# Patient Record
Sex: Female | Born: 1950
Health system: Southern US, Community
[De-identification: ages and names within clinical notes are randomized; demographics above are authoritative.]

## PROBLEM LIST (undated history)

## (undated) DIAGNOSIS — J449 Chronic obstructive pulmonary disease, unspecified: Secondary | ICD-10-CM

## (undated) DIAGNOSIS — F329 Major depressive disorder, single episode, unspecified: Secondary | ICD-10-CM

## (undated) DIAGNOSIS — R7303 Prediabetes: Secondary | ICD-10-CM

## (undated) DIAGNOSIS — M549 Dorsalgia, unspecified: Secondary | ICD-10-CM

## (undated) DIAGNOSIS — L409 Psoriasis, unspecified: Secondary | ICD-10-CM

## (undated) DIAGNOSIS — I10 Essential (primary) hypertension: Secondary | ICD-10-CM

## (undated) DIAGNOSIS — Z9889 Other specified postprocedural states: Secondary | ICD-10-CM

## (undated) DIAGNOSIS — F419 Anxiety disorder, unspecified: Secondary | ICD-10-CM

## (undated) DIAGNOSIS — F32A Depression, unspecified: Secondary | ICD-10-CM

## (undated) DIAGNOSIS — E039 Hypothyroidism, unspecified: Secondary | ICD-10-CM

## (undated) DIAGNOSIS — G8929 Other chronic pain: Secondary | ICD-10-CM

## (undated) DIAGNOSIS — M1990 Unspecified osteoarthritis, unspecified site: Secondary | ICD-10-CM

## (undated) DIAGNOSIS — R112 Nausea with vomiting, unspecified: Secondary | ICD-10-CM

## (undated) DIAGNOSIS — K219 Gastro-esophageal reflux disease without esophagitis: Secondary | ICD-10-CM

## (undated) DIAGNOSIS — J189 Pneumonia, unspecified organism: Secondary | ICD-10-CM

## (undated) DIAGNOSIS — E079 Disorder of thyroid, unspecified: Secondary | ICD-10-CM

## (undated) DIAGNOSIS — G709 Myoneural disorder, unspecified: Secondary | ICD-10-CM

## (undated) HISTORY — PX: HAND SURGERY: SHX662

## (undated) HISTORY — DX: Major depressive disorder, single episode, unspecified: F32.9

## (undated) HISTORY — PX: OTHER SURGICAL HISTORY: SHX169

## (undated) HISTORY — DX: Depression, unspecified: F32.A

## (undated) HISTORY — PX: OOPHORECTOMY: SHX86

## (undated) HISTORY — DX: Psoriasis, unspecified: L40.9

## (undated) HISTORY — PX: ABDOMINAL HYSTERECTOMY: SHX81

## (undated) HISTORY — PX: CARPAL TUNNEL RELEASE: SHX101

---

## 2003-12-06 ENCOUNTER — Ambulatory Visit: Payer: Self-pay

## 2004-08-02 ENCOUNTER — Ambulatory Visit: Payer: Self-pay | Admitting: General Practice

## 2004-09-27 ENCOUNTER — Encounter: Payer: Self-pay | Admitting: General Practice

## 2006-04-02 ENCOUNTER — Ambulatory Visit: Payer: Self-pay

## 2006-08-19 ENCOUNTER — Ambulatory Visit: Payer: Self-pay | Admitting: Unknown Physician Specialty

## 2006-09-05 ENCOUNTER — Ambulatory Visit: Payer: Self-pay | Admitting: Unknown Physician Specialty

## 2006-09-12 ENCOUNTER — Ambulatory Visit: Payer: Self-pay | Admitting: Unknown Physician Specialty

## 2006-09-23 ENCOUNTER — Ambulatory Visit: Payer: Self-pay | Admitting: Unknown Physician Specialty

## 2007-07-01 ENCOUNTER — Ambulatory Visit: Payer: Self-pay

## 2008-03-22 DIAGNOSIS — M5137 Other intervertebral disc degeneration, lumbosacral region: Secondary | ICD-10-CM | POA: Insufficient documentation

## 2008-09-02 DIAGNOSIS — R519 Headache, unspecified: Secondary | ICD-10-CM | POA: Insufficient documentation

## 2008-09-02 DIAGNOSIS — I10 Essential (primary) hypertension: Secondary | ICD-10-CM | POA: Insufficient documentation

## 2008-09-19 DIAGNOSIS — F17200 Nicotine dependence, unspecified, uncomplicated: Secondary | ICD-10-CM | POA: Insufficient documentation

## 2008-09-19 DIAGNOSIS — Z720 Tobacco use: Secondary | ICD-10-CM | POA: Insufficient documentation

## 2008-10-14 ENCOUNTER — Ambulatory Visit: Payer: Self-pay

## 2008-10-21 DIAGNOSIS — M48061 Spinal stenosis, lumbar region without neurogenic claudication: Secondary | ICD-10-CM | POA: Insufficient documentation

## 2008-10-25 DIAGNOSIS — R339 Retention of urine, unspecified: Secondary | ICD-10-CM | POA: Insufficient documentation

## 2009-03-14 DIAGNOSIS — F411 Generalized anxiety disorder: Secondary | ICD-10-CM | POA: Insufficient documentation

## 2009-07-01 DIAGNOSIS — K219 Gastro-esophageal reflux disease without esophagitis: Secondary | ICD-10-CM | POA: Insufficient documentation

## 2011-05-12 ENCOUNTER — Emergency Department: Payer: Self-pay | Admitting: Emergency Medicine

## 2011-10-10 ENCOUNTER — Emergency Department: Payer: Self-pay | Admitting: Emergency Medicine

## 2011-10-10 LAB — URINALYSIS, COMPLETE
Glucose,UR: NEGATIVE mg/dL (ref 0–75)
Hyaline Cast: 3
Ph: 5 (ref 4.5–8.0)
Protein: NEGATIVE
RBC,UR: 2 /HPF (ref 0–5)
Squamous Epithelial: <1

## 2012-05-06 DIAGNOSIS — N390 Urinary tract infection, site not specified: Secondary | ICD-10-CM | POA: Insufficient documentation

## 2012-08-05 DIAGNOSIS — Z0289 Encounter for other administrative examinations: Secondary | ICD-10-CM | POA: Insufficient documentation

## 2012-08-05 DIAGNOSIS — Z79899 Other long term (current) drug therapy: Secondary | ICD-10-CM | POA: Insufficient documentation

## 2012-08-05 DIAGNOSIS — G894 Chronic pain syndrome: Secondary | ICD-10-CM | POA: Insufficient documentation

## 2012-08-23 ENCOUNTER — Emergency Department: Payer: Self-pay | Admitting: Unknown Physician Specialty

## 2013-02-02 ENCOUNTER — Emergency Department: Payer: Self-pay | Admitting: Emergency Medicine

## 2013-02-02 LAB — TROPONIN I: Troponin-I: <0.02 ng/mL

## 2013-02-02 LAB — BASIC METABOLIC PANEL
Anion Gap: 2 — ABNORMAL LOW (ref 7–16)
BUN: 8 mg/dL (ref 7–18)
Calcium, Total: 9.9 mg/dL (ref 8.5–10.1)
Co2: 30 mmol/L (ref 21–32)
Creatinine: 0.76 mg/dL (ref 0.60–1.30)
EGFR (Non-African Amer.): >60
Osmolality: 271 (ref 275–301)
Potassium: 3.7 mmol/L (ref 3.5–5.1)
Sodium: 135 mmol/L — ABNORMAL LOW (ref 136–145)

## 2013-02-02 LAB — CBC
HCT: 49 % — ABNORMAL HIGH (ref 35.0–47.0)
MCH: 30.2 pg (ref 26.0–34.0)
MCV: 90 fL (ref 80–100)
Platelet: 241 10*3/uL (ref 150–440)
RDW: 13.2 % (ref 11.5–14.5)
WBC: 7.3 10*3/uL (ref 3.6–11.0)

## 2013-07-28 DIAGNOSIS — F1120 Opioid dependence, uncomplicated: Secondary | ICD-10-CM | POA: Insufficient documentation

## 2013-07-28 DIAGNOSIS — F1190 Opioid use, unspecified, uncomplicated: Secondary | ICD-10-CM | POA: Insufficient documentation

## 2013-09-30 ENCOUNTER — Ambulatory Visit: Payer: Self-pay | Admitting: Family Medicine

## 2014-06-21 DIAGNOSIS — K219 Gastro-esophageal reflux disease without esophagitis: Secondary | ICD-10-CM | POA: Diagnosis not present

## 2014-06-21 DIAGNOSIS — F410 Panic disorder [episodic paroxysmal anxiety] without agoraphobia: Secondary | ICD-10-CM | POA: Diagnosis present

## 2014-06-22 ENCOUNTER — Emergency Department
Admission: EM | Admit: 2014-06-22 | Discharge: 2014-06-23 | Disposition: A | Discharge status: Home / Self Care | Payer: PPO | Attending: Emergency Medicine | Admitting: Emergency Medicine

## 2014-06-22 ENCOUNTER — Emergency Department
Admission: EM | Admit: 2014-06-22 | Discharge: 2014-06-22 | Disposition: A | Discharge status: Home / Self Care | Payer: PPO | Attending: Emergency Medicine | Admitting: Emergency Medicine

## 2014-06-22 ENCOUNTER — Encounter: Payer: Self-pay | Admitting: Urgent Care

## 2014-06-22 ENCOUNTER — Encounter: Payer: Self-pay | Admitting: Emergency Medicine

## 2014-06-22 DIAGNOSIS — F410 Panic disorder [episodic paroxysmal anxiety] without agoraphobia: Secondary | ICD-10-CM

## 2014-06-22 DIAGNOSIS — Z720 Tobacco use: Secondary | ICD-10-CM | POA: Insufficient documentation

## 2014-06-22 DIAGNOSIS — F1320 Sedative, hypnotic or anxiolytic dependence, uncomplicated: Secondary | ICD-10-CM | POA: Insufficient documentation

## 2014-06-22 DIAGNOSIS — Z79899 Other long term (current) drug therapy: Secondary | ICD-10-CM | POA: Insufficient documentation

## 2014-06-22 DIAGNOSIS — R443 Hallucinations, unspecified: Secondary | ICD-10-CM

## 2014-06-22 DIAGNOSIS — F32A Depression, unspecified: Secondary | ICD-10-CM

## 2014-06-22 DIAGNOSIS — F419 Anxiety disorder, unspecified: Secondary | ICD-10-CM

## 2014-06-22 DIAGNOSIS — I10 Essential (primary) hypertension: Secondary | ICD-10-CM | POA: Insufficient documentation

## 2014-06-22 HISTORY — DX: Other chronic pain: G89.29

## 2014-06-22 HISTORY — DX: Disorder of thyroid, unspecified: E07.9

## 2014-06-22 HISTORY — DX: Dorsalgia, unspecified: M54.9

## 2014-06-22 HISTORY — DX: Anxiety disorder, unspecified: F41.9

## 2014-06-22 HISTORY — DX: Essential (primary) hypertension: I10

## 2014-06-22 HISTORY — DX: Gastro-esophageal reflux disease without esophagitis: K21.9

## 2014-06-22 LAB — COMPREHENSIVE METABOLIC PANEL
ALBUMIN: 4.1 g/dL (ref 3.5–5.0)
ALK PHOS: 94 U/L (ref 38–126)
ALT: 20 U/L (ref 14–54)
AST: 26 U/L (ref 15–41)
Anion gap: 11 (ref 5–15)
BILIRUBIN TOTAL: 0.5 mg/dL (ref 0.3–1.2)
BUN: 18 mg/dL (ref 6–20)
CHLORIDE: 101 mmol/L (ref 101–111)
CO2: 28 mmol/L (ref 22–32)
CREATININE: 1.06 mg/dL — AB (ref 0.44–1.00)
Calcium: 9 mg/dL (ref 8.9–10.3)
GFR calc non Af Amer: 54 mL/min — ABNORMAL LOW (ref >60)
Glucose, Bld: 123 mg/dL — ABNORMAL HIGH (ref 65–99)
POTASSIUM: 3.1 mmol/L — AB (ref 3.5–5.1)
Sodium: 140 mmol/L (ref 135–145)
Total Protein: 6.8 g/dL (ref 6.5–8.1)

## 2014-06-22 LAB — URINE DRUG SCREEN, QUALITATIVE (ARMC ONLY)
AMPHETAMINES, UR SCREEN: NOT DETECTED
Barbiturates, Ur Screen: POSITIVE — AB
Benzodiazepine, Ur Scrn: POSITIVE — AB
Cannabinoid 50 Ng, Ur ~~LOC~~: NOT DETECTED
Cocaine Metabolite,Ur ~~LOC~~: NOT DETECTED
MDMA (Ecstasy)Ur Screen: NOT DETECTED
METHADONE SCREEN, URINE: NOT DETECTED
OPIATE, UR SCREEN: NOT DETECTED
PHENCYCLIDINE (PCP) UR S: NOT DETECTED
Tricyclic, Ur Screen: NOT DETECTED

## 2014-06-22 LAB — CBC
HEMATOCRIT: 44.6 % (ref 35.0–47.0)
Hemoglobin: 14.8 g/dL (ref 12.0–16.0)
MCH: 31 pg (ref 26.0–34.0)
MCHC: 33.3 g/dL (ref 32.0–36.0)
MCV: 93.1 fL (ref 80.0–100.0)
Platelets: 330 10*3/uL (ref 150–440)
RBC: 4.79 MIL/uL (ref 3.80–5.20)
RDW: 13.4 % (ref 11.5–14.5)
WBC: 9.8 10*3/uL (ref 3.6–11.0)

## 2014-06-22 LAB — URINALYSIS COMPLETE WITH MICROSCOPIC (ARMC ONLY)
Bilirubin Urine: NEGATIVE
GLUCOSE, UA: NEGATIVE mg/dL
Hgb urine dipstick: NEGATIVE
Ketones, ur: NEGATIVE mg/dL
NITRITE: NEGATIVE
Protein, ur: 30 mg/dL — AB
SPECIFIC GRAVITY, URINE: 1.02 (ref 1.005–1.030)
pH: 5 (ref 5.0–8.0)

## 2014-06-22 MED ORDER — LORAZEPAM 1 MG PO TABS
1.0000 mg | ORAL_TABLET | Freq: Once | ORAL | Status: AC
  Administered 2014-06-22: 1 mg via ORAL

## 2014-06-22 MED ORDER — LORAZEPAM 1 MG PO TABS
ORAL_TABLET | ORAL | Status: AC
  Administered 2014-06-22: 1 mg via ORAL
  Filled 2014-06-22: qty 1

## 2014-06-22 MED ORDER — GI COCKTAIL ~~LOC~~
ORAL | Status: AC
  Administered 2014-06-22: 30 mL via ORAL
  Filled 2014-06-22: qty 30

## 2014-06-22 MED ORDER — PANTOPRAZOLE SODIUM 40 MG PO TBEC
DELAYED_RELEASE_TABLET | ORAL | Status: AC
  Administered 2014-06-22: 40 mg via ORAL
  Filled 2014-06-22: qty 1

## 2014-06-22 MED ORDER — PANTOPRAZOLE SODIUM 40 MG PO TBEC
40.0000 mg | DELAYED_RELEASE_TABLET | Freq: Once | ORAL | Status: AC
  Administered 2014-06-22: 40 mg via ORAL

## 2014-06-22 MED ORDER — ALPRAZOLAM 0.5 MG PO TABS: 0.5000 mg | ORAL_TABLET | Freq: Two times a day (BID) | ORAL | Status: DC

## 2014-06-22 MED ORDER — GI COCKTAIL ~~LOC~~
30.0000 mL | Freq: Once | ORAL | Status: AC
  Administered 2014-06-22: 30 mL via ORAL

## 2014-06-22 NOTE — BH Assessment (Signed)
Assessment Note  [name] is an 64 y.o. female. She reports to the ED with her family members.  She reports feeling depressed and anxious.  Reports that she is having hallucinations.  She expressed that she is seeing people and objects around her. She states the hallucinations do not speak to her or make noises.  She reports seeing "Strange objects".  She denied having homicidal or suicidal ideation or intent.  Axis I: Anxiety Disorder NOS Axis II: Deferred Axis III:  Past Medical History  Diagnosis Date   Anxiety    Anxiety attack    GERD (gastroesophageal reflux disease)    Hypertension    Thyroid disease    Chronic back pain    Axis IV: problems related to social environment Axis V: 51-60 moderate symptoms  Past Medical History:  Past Medical History  Diagnosis Date   Anxiety    Anxiety attack    GERD (gastroesophageal reflux disease)    Hypertension    Thyroid disease    Chronic back pain     Past Surgical History  Procedure Laterality Date   Abdominal hysterectomy     Back surgery     Bladder stimulator     Hand surgery Right     Family History: No family history on file.  Social History:  reports that she has been smoking Cigarettes.  She has been smoking about 1.00 pack per day. She does not have any smokeless tobacco history on file. She reports that she does not drink alcohol or use illicit drugs.  Additional Social History:  Alcohol / Drug Use History of alcohol / drug use?: No history of alcohol / drug abuse  CIWA: CIWA-Ar BP: (!) 117/59 mmHg Pulse Rate: 89 COWS:    Allergies: No Known Allergies  Home Medications:  (Not in a hospital admission)  OB/GYN Status:  No LMP recorded. Patient is postmenopausal.  General Assessment Data Location of Assessment: [facility] ED TTS Assessment: In system Is this a Tele or Face-to-Face Assessment?: Face-to-Face Is this an Initial Assessment or a Re-assessment for this encounter?: Initial  Assessment Marital status: Widowed Living Arrangements: Alone Can pt return to current living arrangement?: Yes Admission Status: Voluntary Is patient capable of signing voluntary admission?: Yes Referral Source: MD Insurance type: [employer]     Crisis Care Plan Living Arrangements: Alone Name of Psychiatrist: None Name of Therapist: None  Education Status Is patient currently in school?: No  Risk to self with the past 6 months Suicidal Ideation: No Has patient been a risk to self within the past 6 months prior to admission? : No Suicidal Intent: No Has patient had any suicidal intent within the past 6 months prior to admission? : No Is patient at risk for suicide?: No Suicidal Plan?: No Has patient had any suicidal plan within the past 6 months prior to admission? : No Substance abuse history and/or treatment for substance abuse?: No  Risk to Others within the past 6 months Homicidal Ideation: No Does patient have any lifetime risk of violence toward others beyond the six months prior to admission? : No Thoughts of Harm to Others: No Current Homicidal Intent: No Current Homicidal Plan: No History of harm to others?: No Assessment of Violence: None Noted Does patient have access to weapons?: No Criminal Charges Pending?: No Does patient have a court date: No Is patient on probation?: No  Psychosis Hallucinations: Visual Delusions: None noted  Mental Status Report Appearance/Hygiene: In scrubs Eye Contact: Good Motor  Activity: Unremarkable Speech: Unremarkable Level of Consciousness: Alert Mood: Sullen Affect: Flat Anxiety Level: Moderate Judgement: Unimpaired Orientation: Person, Place, Time, Situation Obsessive Compulsive Thoughts/Behaviors: None                      Abuse/Neglect Assessment (Assessment to be complete while patient is alone) Physical Abuse: Denies Verbal Abuse: Denies Sexual Abuse: Denies Exploitation of  patient/patient's resources: Denies Self-Neglect: Denies     Advance Directives (For Healthcare) Does patient have an advance directive?: No Would patient like information on creating an advanced directive?: Yes - Educational materials given          Disposition:  Disposition Initial Assessment Completed for this Encounter: Yes Disposition of Patient: Referred to (Psychiatric consult)  On Site Evaluation by:   Reviewed with Physician:    Guerry Minors 06/22/2014 10:49 PM

## 2014-06-22 NOTE — ED Notes (Signed)
BEHAVIORAL HEALTH ROUNDING Patient sleeping: No. Patient alert and oriented: yes Behavior appropriate: Yes.  ; If no, describe:  Nutrition and fluids offered: Yes  Toileting and hygiene offered: Yes  Sitter present: yes Law enforcement present: Yes  

## 2014-06-22 NOTE — ED Notes (Signed)
Patient contacted daughter-in-law. Family to present to ED to pick up patient. ETA 15-20 minutes. Charge nurse aware; patient to remain in room until family arrives secondary to anxiety.

## 2014-06-22 NOTE — ED Notes (Signed)

## 2014-06-22 NOTE — ED Notes (Signed)
Pt  VOLUNTARY   PENDING  PSYCH  CONSULT

## 2014-06-22 NOTE — ED Notes (Signed)

## 2014-06-22 NOTE — ED Notes (Signed)
Continues to attempt to secure transportation home. This RN has been helping patient because she is reportedly "too nervous".

## 2014-06-22 NOTE — ED Notes (Signed)
Pt watching tv

## 2014-06-22 NOTE — ED Provider Notes (Signed)
____________________________________________  Time seen: 1700  I have reviewed the triage vital signs and the nursing notes.   HISTORY  Chief Complaint Hallucinations   History limited by: Not Limited   HPI [name] is a 64 y.o. female resents to the emergency department today with concerns for hallucinations. She states hallucinations been going on for the past 4-5 days. This mainly of her seeing people that are not there. She denies that they're telling her to harm herself or others. She denies any SI. She states that she has subsequently had panic attacks because of these hallucinations. Was seen in the emergency department earlier this morning for panic and intact. She states today  She was driving her car when she started feeling off. She then asked her cousin in the backseat to drive. It was at this point she realized her cousin was not there and she was having yet another hallucination R concern. She became lost. Family had to come pick her up. Patient denies any fevers, chest pain, shortness of breath.     Past Medical History  Diagnosis Date  . Anxiety   . Anxiety attack   . GERD (gastroesophageal reflux disease)   . Hypertension   . Thyroid disease   . Chronic back pain     There are no active problems to display for this patient.   Past Surgical History  Procedure Laterality Date  . Abdominal hysterectomy    . Back surgery    . Bladder stimulator    . Hand surgery Right     Current Outpatient Rx  Name  Route  Sig  Dispense  Refill  . ALPRAZolam (XANAX) 0.5 MG tablet   Oral   Take 1 tablet (0.5 mg total) by mouth 2 (two) times daily.   4 tablet   0     Allergies Review of patient's allergies indicates no known allergies.  No family history on file.  Social History History  Substance Use Topics  . Smoking status: Current Every Day Smoker -- 1.00 packs/day    Types: Cigarettes  . Smokeless tobacco: Not on file  . Alcohol Use: No     Review of Systems  Constitutional: Negative for fever. Cardiovascular: Negative for chest pain. Respiratory: Negative for shortness of breath. Gastrointestinal: Negative for abdominal pain, vomiting and diarrhea. Genitourinary: Negative for dysuria. Musculoskeletal: Negative for back pain. Skin: Negative for rash. Neurological: Negative for headaches, focal weakness or numbness. Psychiatric:visual hallucinations  10-point ROS otherwise negative.  ____________________________________________   PHYSICAL EXAM:  VITAL SIGNS: ED Triage Vitals  Enc Vitals Group     BP 06/22/14 1626 126/60 mmHg     Pulse Rate 06/22/14 1626 103     Resp 06/22/14 1626 18     Temp 06/22/14 1626 98.4 F (36.9 C)     Temp Source 06/22/14 1626 Oral     SpO2 06/22/14 1626 96 %     Weight 06/22/14 1626 167 lb (75.751 kg)     Height 06/22/14 1626 5\' 2"  (1.575 m)   Constitutional: Alert and oriented. Well appearing and in no distress. Eyes: Conjunctivae are normal. PERRL. Normal extraocular movements. ENT   Head: Normocephalic and atraumatic.   Nose: No congestion/rhinnorhea.   Mouth/Throat: Mucous membranes are moist.   Neck: No stridor. Hematological/Lymphatic/Immunilogical: No cervical lymphadenopathy. Cardiovascular: Normal rate, regular rhythm.  No murmurs, rubs, or gallops. Respiratory: Normal respiratory effort without tachypnea nor retractions. Breath sounds are clear and equal bilaterally. No wheezes/rales/rhonchi. Gastrointestinal: Soft and nontender.  No distention. There is no CVA tenderness. Genitourinary: Deferred Musculoskeletal: Normal range of motion in all extremities. No joint effusions.  No lower extremity tenderness nor edema. Neurologic:  Normal speech and language. No gross focal neurologic deficits are appreciated. Speech is normal.  Skin:  Skin is warm, dry and intact. No rash noted. Psychiatric: Mood and affect are normal. Speech and behavior are normal.  Patient exhibits appropriate insight and judgment. patient does not appear to be responding to any internal stimuli.  ____________________________________________    LABS (pertinent positives/negatives)  Labs Reviewed  COMPREHENSIVE METABOLIC PANEL - Abnormal; Notable for the following:    Potassium 3.1 (*)    Glucose, Bld 123 (*)    Creatinine, Ser 1.06 (*)    GFR calc non Af Amer 54 (*)    All other components within normal limits  URINALYSIS COMPLETEWITH MICROSCOPIC ([facility])  - Abnormal; Notable for the following:    Color, Urine YELLOW (*)    APPearance HAZY (*)    Protein, ur 30 (*)    Leukocytes, UA 2+ (*)    Bacteria, UA RARE (*)    Squamous Epithelial / LPF 0-5 (*)    All other components within normal limits  URINE DRUG SCREEN, QUALITATIVE ([facility]) - Abnormal; Notable for the following:    Barbiturates, Ur Screen POSITIVE (*)    Benzodiazepine, Ur Scrn POSITIVE (*)    All other components within normal limits  CBC     ____________________________________________   EKG  None  ____________________________________________    RADIOLOGY  None  ____________________________________________   PROCEDURES  Procedure(s) performed: None  Critical Care performed: No  ____________________________________________   INITIAL IMPRESSION / ASSESSMENT AND PLAN / ED COURSE  Pertinent labs & imaging results that were available during my care of the patient were reviewed by me and considered in my medical decision making (see chart for details).  She here with hallucinations. They have been going on for 4-5 days. On exam patient is calm and acting appropriately. Does not appear to be responding to any internal stimuli.Patient states she is willing to stay to see psychiatry. Let work without any concerning findings. Will give patient a dose of fosfomycin.  ____________________________________________   FINAL CLINICAL IMPRESSION(S) / ED DIAGNOSES  Final diagnoses:   Hallucinations     Nance Pear, MD 06/23/14 0000

## 2014-06-22 NOTE — Discharge Instructions (Signed)

## 2014-06-22 NOTE — ED Notes (Signed)
Pt to ed with c/o hallucinations since last Thursday.  Pt states she has not been able to sleep since Thursday of last week.  Pt describes seeing "things" in her house and this has caused her to be unable to sleep.  Pt reports multiple panic attacks recently.  States she was driving today and became lost and could not find her way home.  Denies SI, denies HI.  Pt states she has appt in the am with Dr Manuella Ghazi

## 2014-06-22 NOTE — ED Notes (Signed)
Patient assisted with using the phone to arrange transportation.

## 2014-06-22 NOTE — ED Notes (Signed)
Pt brought in by family with hallcinations.  Pt seen in the er last night for similar sx and was discharged.  Family states pt continues to see things and is not sleeping    Denies etoh use or drug use.   Pt nervous acting, talkative.  Pt fidgeting with her hands.  Pt dressed  Out by rn and tech in room.  Family sent to the lobby.

## 2014-06-22 NOTE — ED Notes (Signed)
Patient presents to ED 1 from home via EMS. Patient reports that she is anxious and having hallucinations x 2 days. Patient is seeing "rabbits". Patient also having epigastric pain as well - out of reflux medication. Patient has not slept since Thursday. Sees PCP on Wednesday for medication refills.

## 2014-06-22 NOTE — ED Notes (Signed)
Pt ate dinner tray.  Watching tv.  Calm and cooperative.

## 2014-06-22 NOTE — ED Provider Notes (Signed)
[facility] Emergency Department Provider Note  ____________________________________________  Time seen: 12:10am  I have reviewed the triage vital signs and the nursing notes.   HISTORY  Chief Complaint Panic Attack and Gastrophageal Reflux      HPI [name] is a 64 y.o. female presents with panic attack today. Patient states she ran out of her Xanax 2 days ago. Patient states neck supported Dr. Candiss Norse is on Wednesday. Patient denies any chest pain or shortness of breath. However patient does admit to feeling very anxious at present.     No past medical history on file.  There are no active problems to display for this patient.   No past surgical history on file.  No current outpatient prescriptions on file.  Allergies Review of patient's allergies indicates no known allergies.  No family history on file.  Social History History  Substance Use Topics  . Smoking status: Not on file  . Smokeless tobacco: Not on file  . Alcohol Use: Not on file    Review of Systems  Constitutional: Negative for fever. Eyes: Negative for visual changes. ENT: Negative for sore throat. Cardiovascular: Negative for chest pain. Respiratory: Negative for shortness of breath. Gastrointestinal: Negative for abdominal pain, vomiting and diarrhea. Genitourinary: Negative for dysuria. Musculoskeletal: Negative for back pain. Skin: Negative for rash. Neurological: Negative for headaches, focal weakness or numbness. Psychiatric: anxious  10-point ROS otherwise negative.  ____________________________________________   PHYSICAL EXAM:  VITAL SIGNS: ED Triage Vitals  Enc Vitals Group     BP 06/22/14 0015 104/75 mmHg     Pulse Rate 06/22/14 0015 100     Resp 06/22/14 0015 22     Temp 06/22/14 0015 98.9 F (37.2 C)     Temp Source 06/22/14 0015 Oral     SpO2 06/22/14 0015 97 %     Weight 06/22/14 0015 167 lb (75.751 kg)     Height 06/22/14 0015 5\' 3"   (1.6 m)     Head Cir --      Peak Flow --      Pain Score 06/22/14 0016 5     Pain Loc --      Pain Edu? --      Excl. in [place]? --      Constitutional: Alert and oriented. Well appearing and in no distress. Eyes: Conjunctivae are normal. PERRL. Normal extraocular movements. ENT   Head: Normocephalic and atraumatic.   Nose: No congestion/rhinnorhea.   Mouth/Throat: Mucous membranes are moist.   Neck: No stridor. Hematological/Lymphatic/Immunilogical: No cervical lymphadenopathy. Cardiovascular: Normal rate, regular rhythm. Normal and symmetric distal pulses are present in all extremities. No murmurs, rubs, or gallops. Respiratory: Normal respiratory effort without tachypnea nor retractions. Breath sounds are clear and equal bilaterally. No wheezes/rales/rhonchi. Gastrointestinal: Soft and nontender. No distention. There is no CVA tenderness. Genitourinary: deferred Musculoskeletal: Nontender with normal range of motion in all extremities. No joint effusions.  No lower extremity tenderness nor edema. Neurologic:  Normal speech and language. No gross focal neurologic deficits are appreciated. Speech is normal.  Skin:  Skin is warm, dry and intact. No rash noted. Psychiatric: Anxious affect. Speech and behavior are normal. Patient exhibits appropriate insight and judgment.      INITIAL IMPRESSION / ASSESSMENT AND PLAN / ED COURSE  Pertinent labs & imaging results that were available during my care of the patient were reviewed by me and considered in my medical decision making (see chart for details).  History of physical exam consistent  with panic attack secondary to "running out of Xanax". Patient received Ativan 1 mg tablet Emergency Department we'll prescribe Xanax for one day until patient's follow-up on Wednesday.  ____________________________________________   FINAL CLINICAL IMPRESSION(S) / ED DIAGNOSES  Final diagnoses:  Panic attacks      Gregor Hams, MD 06/30/14 [phone]

## 2014-06-23 DIAGNOSIS — F32A Depression, unspecified: Secondary | ICD-10-CM

## 2014-06-23 DIAGNOSIS — F419 Anxiety disorder, unspecified: Secondary | ICD-10-CM

## 2014-06-23 MED ORDER — ALPRAZOLAM 0.5 MG PO TABS: 0.5000 mg | ORAL_TABLET | Freq: Once | ORAL | Status: AC

## 2014-06-23 MED ORDER — ONDANSETRON 4 MG PO TBDP: 4.0000 mg | ORAL_TABLET | Freq: Once | ORAL | Status: AC

## 2014-06-23 MED ORDER — AMLODIPINE BESYLATE 5 MG PO TABS: 5.0000 mg | ORAL_TABLET | Freq: Every day | ORAL | Status: DC

## 2014-06-23 MED ORDER — CITALOPRAM HYDROBROMIDE 20 MG PO TABS
20.0000 mg | ORAL_TABLET | Freq: Every day | ORAL | Status: DC
  Administered 2014-06-23: 20 mg via ORAL
  Filled 2014-06-23: qty 1

## 2014-06-23 MED ORDER — ALPRAZOLAM 0.5 MG PO TABS
ORAL_TABLET | ORAL | Status: AC
  Administered 2014-06-23: 08:00:00
  Filled 2014-06-23: qty 1

## 2014-06-23 MED ORDER — LEVOTHYROXINE SODIUM 100 MCG PO TABS
100.0000 ug | ORAL_TABLET | Freq: Every day | ORAL | Status: DC
  Administered 2014-06-23: 100 ug via ORAL
  Filled 2014-06-23 (×2): qty 1

## 2014-06-23 MED ORDER — LISINOPRIL 20 MG PO TABS: 10.0000 mg | ORAL_TABLET | Freq: Two times a day (BID) | ORAL | Status: DC

## 2014-06-23 MED ORDER — ONDANSETRON 4 MG PO TBDP
ORAL_TABLET | ORAL | Status: AC
  Administered 2014-06-23: 4 mg
  Filled 2014-06-23: qty 1

## 2014-06-23 NOTE — ED Notes (Signed)
everette ( son) [phone]  Suanne Marker daughter in [employer] [phone]

## 2014-06-23 NOTE — ED Notes (Signed)
Patient is resting comfortably. 

## 2014-06-23 NOTE — ED Notes (Signed)
BEHAVIORAL HEALTH ROUNDING Patient sleeping: Yes.   Patient alert and oriented: not applicable Behavior appropriate: Yes.  ; If no, describe: (asleep) Nutrition and fluids offered: No Toileting and hygiene offered: No Sitter present: yes Law enforcement present: Yes

## 2014-06-23 NOTE — ED Notes (Signed)
BEHAVIORAL HEALTH ROUNDING Patient sleeping: No. Patient alert and oriented: yes Behavior appropriate: Yes.  ; If no, describe:  Nutrition and fluids offered: Yes Toileting and hygiene offered: Yes  Sitter present: yes Law enforcement present: Yes ODS  

## 2014-06-23 NOTE — ED Provider Notes (Signed)
-----------------------------------------   7:23 PM on 06/23/2014 -----------------------------------------  The patient has been seen by Dr. Weber Cooks of psychiatry and he has determined the patient is stable and can be discharged with no changes in her psychiatric medications. She needs to stay more consistent with the use of her benzodiazepines to avoid withdrawal. She can follow with her usual providers.  Ahmed Prima, MD 06/23/14 [phone]

## 2014-06-23 NOTE — ED Notes (Signed)
NAD noted at time of D/C. Pt denies questions or concerns. Pt ambulatory to the lobby at this time.  

## 2014-06-23 NOTE — ED Notes (Signed)
pt resting quietly on stretcher in darkened exam room; eyes closed, resp even/unlab, no distress noted; will continue to monitor

## 2014-06-23 NOTE — ED Notes (Signed)
BEHAVIORAL HEALTH ROUNDING Patient sleeping: YES Patient alert and oriented: SLEEPING Behavior appropriate: SLEEPING Nutrition and fluids offered: SLEEPING Toileting and hygiene offered: SLEEPING Sitter present: YES Law enforcement present: YES 

## 2014-06-23 NOTE — ED Notes (Signed)
BEHAVIORAL HEALTH ROUNDING Patient sleeping: no Patient alert and oriented: yes Behavior appropriate: Yes.  ; If no, describe:  Nutrition and fluids offered: Yes  Toileting and hygiene offered: Yes  Sitter present: no Law enforcement present: Yes  

## 2014-06-23 NOTE — ED Notes (Signed)
BEHAVIORAL HEALTH ROUNDING Patient sleeping: Yes.   Patient alert and oriented: not applicable Behavior appropriate: Yes.  ; If no, describe:  Nutrition and fluids offered: Yes  Toileting and hygiene offered: Yes  Sitter present: yes Law enforcement present: Yes ODS 

## 2014-06-23 NOTE — ED Provider Notes (Signed)
-----------------------------------------   7:36 AM on 06/23/2014 -----------------------------------------   BP 117/59 mmHg  Pulse 89  Temp(Src) 98.5 F (36.9 C) (Oral)  Resp 20  Ht 5\' 2"  (1.575 m)  Wt 167 lb (75.751 kg)  BMI 30.54 kg/m2  SpO2 95%  The patient had no acute events since last update.  Calm and cooperative at this time.  Disposition is pending per Psychiatry/Behavioral Medicine team recommendations.     Paulette Blanch, MD 06/23/14 [phone]

## 2014-06-23 NOTE — ED Notes (Signed)
BEHAVIORAL HEALTH ROUNDING Patient sleeping: Yes.   Patient alert and oriented: yes Behavior appropriate: Yes.  ; If no, describe:  Nutrition and fluids offered: Yes  Toileting and hygiene offered: Yes  Sitter present: yes Law enforcement present: Yes  

## 2014-06-23 NOTE — ED Notes (Signed)

## 2014-06-23 NOTE — ED Notes (Signed)
BEHAVIORAL HEALTH ROUNDING Patient sleeping: Yes.   Patient alert and oriented: yes Behavior appropriate: Yes.  ; If no, describe:  Nutrition and fluids offered: Yes  Toileting and hygiene offered: Yes  Sitter present: no Law enforcement present: Yes  

## 2014-06-23 NOTE — Consult Note (Signed)
[facility] Consult   Reason for Consult:  Consult for this 64 year old woman with history of anxiety disorder came to the emergency room reporting new onset visual hallucinations Referring Physician:  kaminski Patient Identification: [name] MRN:  [id number] Principal Diagnosis: Sedative or hypnotic withdrawal with delirium Diagnosis:   Patient Active Problem List   Diagnosis Date Noted  . Sedative or hypnotic withdrawal with delirium [F13.231] 06/23/2014  . Anxiety disorder [F41.9] 06/23/2014    Total Time spent with patient: 1 hour  Subjective:   [name] is a 64 y.o. female patient admitted with "I have been feeling nervous". Patient describes recent increase in anxiety symptoms and also visual hallucinations recurred over the last 2 days. No suicidality. Good insight. No particularly new medical problems.  HPI:  History obtained from patient and from the daughter-in-law in from the old chart. Patient reports that for the past few days her anxiety level has increased. She reports she has not been sleeping well for couple days. She said some episodes that she describes as panic and asked. The past 2 days she has been feeling more confused. She describes episodes over the last day of hallucinations in which she sold vague shapes moving around the room. Does not report or hallucinations. Patient denies any suicidal ideation or homicidal ideation. He denies being aware of any particularly new stress. She admits that she ran out of her Xanax "a couple" days ago. She denies she's been using anything other than her usual prescribed medicine.  Past psychiatric history for chronic treatment of anxiety symptoms. She has seen psychiatrists in the past and used to go to [employer] [facility] for the past few years as her medicines prescribed by her primary care doctor. No history of suicide attempts or boils no history of psychiatric hospitalizations. Currently takes citalopram 20 mg  a day Xanax 0.5 mg 3 times a day +1 mg at night  Social history is that she lives independently in an apartment. So all in all her more closest relative and check in on her. They're concerned. She doesn't communicate with him as well as they would like.  Medical history is for a history of high blood pressure and hypothyroidism. Arthritis.  Family history is negative for any kind of mental health problems.  Substance abuse history is that she denies any use of alcohol or drug abuse ever. She doesn't think sheuses her prescription medicine.  Current medicines or alprazolam 0.5 mg 3 times a day and 1 mg at night, Vicodin when necessary but she is prescribed enough to take 4 a day, lisinopril for blood pressure, Synthroid unknown dose, Celexa 20 mg a day and some other blood pressure medicine whose name she doesn't remember HPI Elements:   Quality:  Anxiety and intermittent visual hallucinations. Severity:  Mild to moderate. Timing:  Worse over the last couple days. Duration:  Very transiently do happen. Context:  Probably ran out of her Xanax over the last couple days.  Past Medical History:  Past Medical History  Diagnosis Date  . Anxiety   . Anxiety attack   . GERD (gastroesophageal reflux disease)   . Hypertension   . Thyroid disease   . Chronic back pain     Past Surgical History  Procedure Laterality Date  . Abdominal hysterectomy    . Back surgery    . Bladder stimulator    . Hand surgery Right    Family History: No family history on file. Social History:  History  Alcohol Use No     History  Drug Use No    History   Social History  . Marital Status: Widowed    Spouse Name: N/A  . Number of Children: N/A  . Years of Education: N/A   Social History Main Topics  . Smoking status: Current Every Day Smoker -- 1.00 packs/day    Types: Cigarettes  . Smokeless tobacco: Not on file  . Alcohol Use: No  . Drug Use: No  . Sexual Activity: Not on file   Other Topics  Concern  . None   Social History Narrative   Additional Social History:    History of alcohol / drug use?: No history of alcohol / drug abuse                     Allergies:   Allergies  Allergen Reactions  . Ciprofloxacin Hives and Diarrhea  . Oxycodone Diarrhea    Labs:  Results for orders placed or performed during the hospital encounter of 06/22/14 (from the past 48 hour(s))  CBC     Status: None   Collection Time: 06/22/14  4:34 PM  Result Value Ref Range   WBC 9.8 3.6 - 11.0 K/uL   RBC 4.79 3.80 - 5.20 MIL/uL   Hemoglobin 14.8 12.0 - 16.0 g/dL   HCT 44.6 35.0 - 47.0 %   MCV 93.1 80.0 - 100.0 fL   MCH 31.0 26.0 - 34.0 pg   MCHC 33.3 32.0 - 36.0 g/dL   RDW 13.4 11.5 - 14.5 %   Platelets 330 150 - 440 K/uL  Comprehensive metabolic panel     Status: Abnormal   Collection Time: 06/22/14  4:34 PM  Result Value Ref Range   Sodium 140 135 - 145 mmol/L   Potassium 3.1 (L) 3.5 - 5.1 mmol/L   Chloride 101 101 - 111 mmol/L   CO2 28 22 - 32 mmol/L   Glucose, Bld 123 (H) 65 - 99 mg/dL   BUN 18 6 - 20 mg/dL   Creatinine, Ser 1.06 (H) 0.44 - 1.00 mg/dL   Calcium 9.0 8.9 - 10.3 mg/dL   Total Protein 6.8 6.5 - 8.1 g/dL   Albumin 4.1 3.5 - 5.0 g/dL   AST 26 15 - 41 U/L   ALT 20 14 - 54 U/L   Alkaline Phosphatase 94 38 - 126 U/L   Total Bilirubin 0.5 0.3 - 1.2 mg/dL   GFR calc non Af Amer 54 (L) >60 mL/min   GFR calc Af Amer >60 >60 mL/min    Comment: (NOTE) The eGFR has been calculated using the CKD EPI equation. This calculation has not been validated in all clinical situations. eGFR's persistently <60 mL/min signify possible Chronic Kidney Disease.    Anion gap 11 5 - 15  Urinalysis complete, with microscopic [facility])     Status: Abnormal   Collection Time: 06/22/14  4:34 PM  Result Value Ref Range   Color, Urine YELLOW (A) YELLOW   APPearance HAZY (A) CLEAR   Glucose, UA NEGATIVE NEGATIVE mg/dL   Bilirubin Urine NEGATIVE NEGATIVE   Ketones, ur NEGATIVE  NEGATIVE mg/dL   Specific Gravity, Urine 1.020 1.005 - 1.030   Hgb urine dipstick NEGATIVE NEGATIVE   pH 5.0 5.0 - 8.0   Protein, ur 30 (A) NEGATIVE mg/dL   Nitrite NEGATIVE NEGATIVE   Leukocytes, UA 2+ (A) NEGATIVE   RBC / HPF 0-5 0 - 5 RBC/hpf   WBC, UA  6-30 0 - 5 WBC/hpf   Bacteria, UA RARE (A) NONE SEEN   Squamous Epithelial / LPF 0-5 (A) NONE SEEN   Mucous PRESENT    Amorphous Crystal PRESENT   Urine Drug Screen, Qualitative [facility])     Status: Abnormal   Collection Time: 06/22/14  4:34 PM  Result Value Ref Range   Tricyclic, Ur Screen NONE DETECTED NONE DETECTED   Amphetamines, Ur Screen NONE DETECTED NONE DETECTED   MDMA (Ecstasy)Ur Screen NONE DETECTED NONE DETECTED   Cocaine Metabolite,Ur [place] NONE DETECTED NONE DETECTED   Opiate, Ur Screen NONE DETECTED NONE DETECTED   Phencyclidine (PCP) Ur S NONE DETECTED NONE DETECTED   Cannabinoid 50 Ng, Ur [place] NONE DETECTED NONE DETECTED   Barbiturates, Ur Screen POSITIVE (A) NONE DETECTED   Benzodiazepine, Ur Scrn POSITIVE (A) NONE DETECTED   Methadone Scn, Ur NONE DETECTED NONE DETECTED    Comment: (NOTE) 229  Tricyclics, urine               Cutoff 1000 ng/mL 200  Amphetamines, urine             Cutoff 1000 ng/mL 300  MDMA (Ecstasy), urine           Cutoff 500 ng/mL 400  Cocaine Metabolite, urine       Cutoff 300 ng/mL 500  Opiate, urine                   Cutoff 300 ng/mL 600  Phencyclidine (PCP), urine      Cutoff 25 ng/mL 700  Cannabinoid, urine              Cutoff 50 ng/mL 800  Barbiturates, urine             Cutoff 200 ng/mL 900  Benzodiazepine, urine           Cutoff 200 ng/mL 1000 Methadone, urine                Cutoff 300 ng/mL 1100 1200 The urine drug screen provides only a preliminary, unconfirmed 1300 analytical test result and should not be used for non-medical 1400 purposes. Clinical consideration and professional judgment should 1500 be applied to any positive drug screen result due to possible 1600 interfering  substances. A more specific alternate chemical method 1700 must be used in order to obtain a confirmed analytical result.  1800 Gas chromato graphy / mass spectrometry (GC/MS) is the preferred 1900 confirmatory method.     Vitals: Blood pressure 110/57, pulse 92, temperature 98 F (36.7 C), temperature source Oral, resp. rate 18, height 5' 2"  (1.575 m), weight 75.751 kg (167 lb), SpO2 92 %.  Risk to Self: Suicidal Ideation: No Suicidal Intent: No Is patient at risk for suicide?: No Suicidal Plan?: No Risk to Others: Homicidal Ideation: No Thoughts of Harm to Others: No Current Homicidal Intent: No Current Homicidal Plan: No History of harm to others?: No Assessment of Violence: None Noted Does patient have access to weapons?: No Criminal Charges Pending?: No Does patient have a court date: No Prior Inpatient Therapy:   Prior Outpatient Therapy:    Current Facility-Administered Medications  Medication Dose Route Frequency Provider Last Rate Last Dose  . amLODipine (NORVASC) tablet 5 mg  5 mg Oral Daily Joanne Gavel, MD   5 mg at 06/23/14 1319  . citalopram (CELEXA) tablet 20 mg  20 mg Oral Daily Joanne Gavel, MD   20 mg at 06/23/14 1357  . levothyroxine (SYNTHROID,  LEVOTHROID) tablet 100 mcg  100 mcg Oral QAC breakfast Joanne Gavel, MD   100 mcg at 06/23/14 1357  . lisinopril (PRINIVIL,ZESTRIL) tablet 10 mg  10 mg Oral BID Joanne Gavel, MD   10 mg at 06/23/14 1319   Current Outpatient Prescriptions  Medication Sig Dispense Refill  . ALPRAZolam (XANAX) 1 MG tablet Take 1 mg by mouth at bedtime.    Marland Kitchen amLODipine (NORVASC) 5 MG tablet Take 5 mg by mouth daily.     . citalopram (CELEXA) 20 MG tablet Take 20 mg by mouth daily.     . Hydrocodone-Acetaminophen 7.5-300 MG TABS Take 1 tablet by mouth every 6 (six) hours as needed (for pain).     Marland Kitchen levothyroxine (SYNTHROID, LEVOTHROID) 100 MCG tablet Take 100 mcg by mouth daily before breakfast.     . lisinopril (PRINIVIL,ZESTRIL) 10  MG tablet Take 10 mg by mouth 2 (two) times daily.    Marland Kitchen ALPRAZolam (XANAX) 0.5 MG tablet Take 1 tablet (0.5 mg total) by mouth 2 (two) times daily. (Patient taking differently: Take 0.5 mg by mouth every 8 (eight) hours as needed for anxiety. ) 4 tablet 0    Musculoskeletal: Strength & Muscle Tone: decreased Gait & Station: normal Patient leans: N/A  Psychiatric Specialty Exam: Physical Exam  Constitutional: She is oriented to person, place, and time. She appears well-developed.  HENT:  Head: Normocephalic and atraumatic.  Eyes: Conjunctivae are normal. Pupils are equal, round, and reactive to light.  Neck: Normal range of motion. Neck supple.  Respiratory: Effort normal.  Musculoskeletal: Normal range of motion.  Neurological: She is alert and oriented to person, place, and time.  Skin: Skin is warm and dry.  Psychiatric: Her speech is normal and behavior is normal. Judgment and thought content normal. Her mood appears anxious. Her affect is not angry. Cognition and memory are impaired. She does not exhibit a depressed mood. She exhibits abnormal recent memory.    Review of Systems  Constitutional: Negative.   HENT: Negative.   Eyes: Negative.   Respiratory: Negative.   Cardiovascular: Negative.   Gastrointestinal: Negative.   Musculoskeletal: Negative.   Skin: Negative.   Neurological: Negative.   Psychiatric/Behavioral: Positive for hallucinations. Negative for depression, suicidal ideas, memory loss and substance abuse. The patient is nervous/anxious and has insomnia.     Blood pressure 110/57, pulse 92, temperature 98 F (36.7 C), temperature source Oral, resp. rate 18, height 5' 2"  (1.575 m), weight 75.751 kg (167 lb), SpO2 92 %.Body mass index is 30.54 kg/(m^2).  General Appearance: Disheveled  Eye Contact::  Good  Speech:  Clear and Coherent  Volume:  Normal  Mood:  Anxious  Affect:  Congruent  Thought Process:  Logical  Orientation:  Full (Time, Place, and Person)   Thought Content:  Hallucinations: Visual  Suicidal Thoughts:  No  Homicidal Thoughts:  No  Memory:  Immediate;   Good Recent;   Fair Remote;   Fair  Judgement:  Intact  Insight:  Fair  Psychomotor Activity:  Normal  Concentration:  Negative  Recall:  [place] of Knowledge:Good  Language: Good  Akathisia:  Negative  Handed:  Right  AIMS (if indicated):     Assets:  Agricultural consultant Housing Intimacy Social Support  ADL's:  Intact  Cognition: WNL  Sleep:      Medical Decision Making: Review of Psycho-Social Stressors (1), Review or order clinical lab tests (1), Discuss test with performing physician (1), New Problem, with  no additional work-up planned (3) and Review of New Medication or Change in Dosage (2)  Treatment Plan Summary: Plan Patient appears to probably be having visual hallucinations and increased anxiety at least in part due to irregularities with her medicine. I think the most likely cause especially of the visual hallucinations is acute Xanax withdrawal. Psychological education completed about the importance of staying consistent with her medicine. I emphasized that this man normally not running out of bed but not taking excessive amounts of it as well. Also educated her about the relative risks of the combination of narcotics and benzodiazepines. No medications were ordered.  Plan:  Recommend psychiatric Inpatient admission when medically cleared. Supportive therapy provided about ongoing stressors. Disposition: Follow-up with her outpatient provider. She can be released from the emergency room. Case discussed with emergency room physician as well as patient's family and the patient herself  Alethia Berthold 06/23/2014 5:11 PM

## 2014-06-23 NOTE — ED Notes (Signed)
Po zofran given for nausea.  States she feels like this when she is having a panic attack. But feels better after taking xanax this am

## 2014-06-23 NOTE — ED Notes (Signed)
Pt sleeping. 

## 2014-06-23 NOTE — Discharge Instructions (Signed)
As you have taken a benzodiazepine for some time you may experience withdrawal symptoms if you stop taking the medicine. Take her medicines consistently. You been seen by psychiatry here in the emergency department he would like you to follow-up with your regular psychiatric providers. Return to the emergency department if you have further worrisome symptoms or other urgent concerns.  Benzodiazepine Withdrawal  Benzodiazepines are a group of drugs that are prescribed for both short-term and long-term treatment of a variety of medical conditions. For some of these conditions, such as seizures and sudden and severe muscle spasms, they are used only for a few hours or a few days. For other conditions, such as anxiety, sleep problems, or frequent muscle spasms or to help prevent seizures, they are used for an extended period, usually weeks or months. Benzodiazepines work by changing the way your brain functions. Normally, chemicals in your brain called neurotransmitters send messages between your brain cells. The neurotransmitter that benzodiazepines affect is called gamma-aminobutyric acid (GABA). GABA sends out messages that have a calming effect on many of the functions of your brain. Benzodiazepines make these messages stronger and increase this calming effect. Short-term use of benzodiazepines usually does not cause problems when you stop taking the drugs. However, if you take benzodiazepines for a long time, your body can adjust to the drug and require more of it to produce the same effect (drug tolerance). Eventually, you can develop physical dependence on benzodiazepines, which is when you experience negative effects if your dosage of benzodiazepines is reduced or stopped too quickly. These negative effects are called symptoms of withdrawal. SYMPTOMS Symptoms of withdrawal may begin anytime within the first 10 days after you stop taking the benzodiazepine. They can last from several weeks up to a few  months but usually are the worst between the first 10 to 14 days.  The actual symptoms also vary, depending on the type of benzodiazepine you take. Possible symptoms include:  Anxiety.  Excitability.  Irritability.  Depression.  Mood swings.  Trouble sleeping.  Confusion.  Uncontrollable shaking (tremors).  Muscle weakness.  Seizures. DIAGNOSIS To diagnose benzodiazepine withdrawal, your caregiver will examine you for certain signs, such as:  Rapid heartbeat.  Rapid breathing.  Tremors.  High blood pressure.  Fever.  Mood changes. Your caregiver also may ask the following questions about your use of benzodiazepines:  What type of benzodiazepine did you take?  How much did you take each day?  How long did you take the drug?  When was the last time you took the drug?  Do you take any other drugs?  Have you had alcohol recently?  Have you had a seizure recently?  Have you lost consciousness recently?  Have you had trouble remembering recent events?  Have you had a recent increase in anxiety, irritability, or trouble sleeping? A drug test also may be administered. TREATMENT The treatment for benzodiazepine withdrawal can vary, depending on the type and severity of your symptoms, what type of benzodiazepine you have been taking, and how long you have been taking the benzodiazepine. Sometimes it is necessary for you to be treated in a hospital, especially if you are at risk of seizures.  Often, treatment includes a prescription for a long-acting benzodiazepine, the dosage of which is reduced slowly over a long period. This period could be several weeks or months. Eventually, your dosage will be reduced to a point that you can stop taking the drug, without experiencing withdrawal symptoms. This is called tapered withdrawal.  Occasionally, minor symptoms of withdrawal continue for a few days or weeks after you have completed a tapered withdrawal. SEEK IMMEDIATE  MEDICAL CARE IF:  You have a seizure.  You develop a craving for drugs or alcohol.  You begin to experience symptoms of withdrawal during your tapered withdrawal.  You become very confused.  You lose consciousness.  You have trouble breathing.  You think about hurting yourself or someone else. Document Released: 01/11/2011 Document Revised: 04/16/2011 Document Reviewed: 01/11/2011 [facility] Patient Information 2015 [place], [place]. This information is not intended to replace advice given to you by your health care provider. Make sure you discuss any questions you have with your health care provider.

## 2014-07-22 ENCOUNTER — Encounter: Payer: Self-pay | Admitting: Family Medicine

## 2014-07-22 ENCOUNTER — Ambulatory Visit (INDEPENDENT_AMBULATORY_CARE_PROVIDER_SITE_OTHER): Payer: PPO | Admitting: Family Medicine

## 2014-07-22 ENCOUNTER — Ambulatory Visit: Payer: Self-pay | Admitting: Family Medicine

## 2014-07-22 VITALS — BP 100/60 | HR 90 | Temp 97.8°F | Ht 62.0 in | Wt 167.5 lb

## 2014-07-22 DIAGNOSIS — F410 Panic disorder [episodic paroxysmal anxiety] without agoraphobia: Secondary | ICD-10-CM | POA: Diagnosis not present

## 2014-07-22 DIAGNOSIS — I10 Essential (primary) hypertension: Secondary | ICD-10-CM

## 2014-07-22 MED ORDER — ALPRAZOLAM 1 MG PO TABS: 1.0000 mg | ORAL_TABLET | Freq: Every evening | ORAL | Status: DC | PRN

## 2014-07-22 MED ORDER — LISINOPRIL 10 MG PO TABS: 5.0000 mg | ORAL_TABLET | Freq: Every morning | ORAL | Status: DC

## 2014-07-22 MED ORDER — ALPRAZOLAM 0.5 MG PO TABS: 0.5000 mg | ORAL_TABLET | Freq: Three times a day (TID) | ORAL | Status: DC | PRN

## 2014-07-22 NOTE — Progress Notes (Signed)
Name: [name]   MRN: [id number]    DOB: 11-28-1950   Date:07/22/2014       Progress Note  Subjective  Chief Complaint  Chief Complaint  Patient presents with  . Medication Refill    comes in monthly    Anxiety Presents for follow-up visit. Symptoms include dizziness (recently BP has been dropping to low 90s and she feels dizzy), excessive worry and nervous/anxious behavior. Patient reports no chest pain, feeling of choking, insomnia, irritability, palpitations or shortness of breath.   Past treatments include benzodiazephines. The treatment provided significant relief. Compliance with prior treatments has been good (pt. taking medication exactly as directed, reported no side effects or withdrawl symptoms.).  Hypertension This is a chronic problem. The problem is unchanged. The problem is controlled. Associated symptoms include anxiety. Pertinent negatives include no chest pain, headaches, neck pain, palpitations or shortness of breath. Past treatments include ACE inhibitors and calcium channel blockers. The current treatment provides significant improvement. There are no compliance problems.  There is no history of CAD/MI or CVA.  Recently, her BP has been dropping. It has dropped to as low as 95/57, she feels dizzy when this happens. She is otherwise eating and drinking normally and denies any other symptoms.     Past Medical History  Diagnosis Date  . Anxiety   . Anxiety attack   . GERD (gastroesophageal reflux disease)   . Hypertension   . Thyroid disease   . Chronic back pain     Past Surgical History  Procedure Laterality Date  . Abdominal hysterectomy    . Back surgery    . Bladder stimulator    . Hand surgery Right     Family History  Problem Relation Age of Onset  . Heart disease Mother   . COPD Father     History   Social History  . Marital Status: Widowed    Spouse Name: N/A  . Number of Children: N/A  . Years of Education: N/A   Occupational History   . Not on file.   Social History Main Topics  . Smoking status: Current Every Day Smoker -- 1.00 packs/day    Types: Cigarettes  . Smokeless tobacco: Not on file  . Alcohol Use: No  . Drug Use: No  . Sexual Activity: Not on file   Other Topics Concern  . Not on file   Social History Narrative     Current outpatient prescriptions:  .  ALPRAZolam (XANAX) 0.5 MG tablet, Take 1 tablet (0.5 mg total) by mouth 2 (two) times daily. (Patient taking differently: Take 0.5 mg by mouth every 8 (eight) hours as needed for anxiety. ), Disp: 4 tablet, Rfl: 0 .  ALPRAZolam (XANAX) 1 MG tablet, Take 1 mg by mouth at bedtime., Disp: , Rfl:  .  amLODipine (NORVASC) 5 MG tablet, Take 5 mg by mouth daily. , Disp: , Rfl:  .  citalopram (CELEXA) 20 MG tablet, Take 20 mg by mouth daily. , Disp: , Rfl:  .  Hydrocodone-Acetaminophen 7.5-300 MG TABS, Take 1 tablet by mouth every 6 (six) hours as needed (for pain). , Disp: , Rfl:  .  levothyroxine (SYNTHROID, LEVOTHROID) 100 MCG tablet, Take 100 mcg by mouth daily before breakfast. , Disp: , Rfl:  .  lisinopril (PRINIVIL,ZESTRIL) 10 MG tablet, Take 10 mg by mouth 2 (two) times daily., Disp: , Rfl:   Allergies  Allergen Reactions  . Ciprofloxacin Hives and Diarrhea  . Oxycodone Diarrhea  Review of Systems  Constitutional: Negative for irritability.  Respiratory: Negative for shortness of breath.   Cardiovascular: Negative for chest pain and palpitations.  Musculoskeletal: Negative for neck pain.  Neurological: Positive for dizziness (recently BP has been dropping to low 90s and she feels dizzy). Negative for headaches.  Psychiatric/Behavioral: The patient is nervous/anxious. The patient does not have insomnia.       Objective  Filed Vitals:   07/22/14 0855  BP: 100/60  Pulse: 90  Temp: 97.8 F (36.6 C)  Height: 5\' 2"  (1.575 m)  Weight: 167 lb 8 oz (75.978 kg)  SpO2: 93%    Physical Exam  Constitutional: She is oriented to person,  place, and time and well-developed, well-nourished, and in no distress.  HENT:  Head: Normocephalic and atraumatic.  Cardiovascular: Normal rate and regular rhythm.   Pulses:      Carotid pulses are 2+ on the right side, and 2+ on the left side. Neurological: She is alert and oriented to person, place, and time.  Psychiatric: Affect and judgment normal.  Nursing note and vitals reviewed.         Assessment & Plan 1. Panic disorder without agoraphobia Patient taking alprazolam as directed. She takes 0.5 mg 3 times daily as needed and 1 mg at bedtime as needed for anxiety. Her daughter-in-law helps with arranging medication portions. She was asked to continue taking the medication as directed. Follow-up in one month. 30 day prescription provided. - ALPRAZolam (XANAX) 1 MG tablet; Take 1 tablet (1 mg total) by mouth at bedtime as needed for anxiety.  Dispense: 30 tablet; Refill: 0 - ALPRAZolam (XANAX) 0.5 MG tablet; Take 1 tablet (0.5 mg total) by mouth 3 (three) times daily as needed for anxiety.  Dispense: 90 tablet; Refill: 0  2. Benign hypertension Years that dizziness is most likely from low blood pressure. Patient was advised to decrease lisinopril from 10 mg to 5 mg daily in a.m. She was asked to check her blood pressure regularly. Follow-up in one month. - lisinopril (PRINIVIL,ZESTRIL) 10 MG tablet; Take 0.5 tablets (5 mg total) by mouth every morning. Take 5 mg every morning.  Dispense: 30 tablet; Refill: 1  There are no diagnoses linked to this encounter.  [name] 07/22/2014 9:05 AM

## 2014-08-11 ENCOUNTER — Other Ambulatory Visit: Payer: Self-pay | Admitting: Family Medicine

## 2014-08-11 MED ORDER — AMLODIPINE BESYLATE 5 MG PO TABS: 5.0000 mg | ORAL_TABLET | Freq: Every day | ORAL | Status: DC

## 2014-08-11 MED ORDER — CITALOPRAM HYDROBROMIDE 20 MG PO TABS: 20.0000 mg | ORAL_TABLET | Freq: Every day | ORAL | Status: DC

## 2014-08-19 ENCOUNTER — Encounter: Payer: Self-pay | Admitting: Family Medicine

## 2014-08-19 ENCOUNTER — Ambulatory Visit (INDEPENDENT_AMBULATORY_CARE_PROVIDER_SITE_OTHER): Payer: PPO | Admitting: Family Medicine

## 2014-08-19 VITALS — BP 100/71 | HR 114 | Temp 98.2°F | Resp 20 | Ht 62.0 in | Wt 166.1 lb

## 2014-08-19 DIAGNOSIS — F410 Panic disorder [episodic paroxysmal anxiety] without agoraphobia: Secondary | ICD-10-CM

## 2014-08-19 DIAGNOSIS — F329 Major depressive disorder, single episode, unspecified: Secondary | ICD-10-CM | POA: Insufficient documentation

## 2014-08-19 DIAGNOSIS — E039 Hypothyroidism, unspecified: Secondary | ICD-10-CM | POA: Insufficient documentation

## 2014-08-19 DIAGNOSIS — F32A Depression, unspecified: Secondary | ICD-10-CM | POA: Insufficient documentation

## 2014-08-19 MED ORDER — ALPRAZOLAM 1 MG PO TABS: 1.0000 mg | ORAL_TABLET | Freq: Every evening | ORAL | Status: DC | PRN

## 2014-08-19 MED ORDER — ALPRAZOLAM 0.5 MG PO TABS: 0.5000 mg | ORAL_TABLET | Freq: Three times a day (TID) | ORAL | Status: DC | PRN

## 2014-08-19 NOTE — Progress Notes (Signed)
Name: [name]   MRN: [id number]    DOB: 07-07-1950   Date:08/19/2014       Progress Note  Subjective  Chief Complaint  Chief Complaint  Patient presents with  . Follow-up    4wk  . Medication Refill  . Hypothyroidism    Anxiety Presents for follow-up visit. Symptoms include dizziness, excessive worry and panic. Patient reports no chest pain, muscle tension or shortness of breath. Primary symptoms comment: symptoms controlled on medications on most days.. The quality of sleep is fair.   Past treatments include benzodiazephines and SSRIs. The treatment provided significant relief. Compliance with prior treatments has been good.    Past Medical History  Diagnosis Date  . Anxiety   . Anxiety attack   . GERD (gastroesophageal reflux disease)   . Hypertension   . Thyroid disease   . Chronic back pain     Past Surgical History  Procedure Laterality Date  . Abdominal hysterectomy    . Back surgery    . Bladder stimulator    . Hand surgery Right     Family History  Problem Relation Age of Onset  . Heart disease Mother   . COPD Father     History   Social History  . Marital Status: Widowed    Spouse Name: N/A  . Number of Children: N/A  . Years of Education: N/A   Occupational History  . Not on file.   Social History Main Topics  . Smoking status: Current Every Day Smoker -- 1.00 packs/day    Types: Cigarettes  . Smokeless tobacco: Not on file  . Alcohol Use: No  . Drug Use: No  . Sexual Activity: Not on file   Other Topics Concern  . Not on file   Social History Narrative     Current outpatient prescriptions:  .  ALPRAZolam (XANAX) 0.5 MG tablet, Take 1 tablet (0.5 mg total) by mouth 3 (three) times daily as needed for anxiety., Disp: 90 tablet, Rfl: 0 .  ALPRAZolam (XANAX) 1 MG tablet, Take 1 tablet (1 mg total) by mouth at bedtime as needed for anxiety., Disp: 30 tablet, Rfl: 0 .  amLODipine (NORVASC) 5 MG tablet, Take 1 tablet (5 mg total) by  mouth daily., Disp: 30 tablet, Rfl: 0 .  citalopram (CELEXA) 20 MG tablet, Take 1 tablet (20 mg total) by mouth daily., Disp: 30 tablet, Rfl: 0 .  ergocalciferol (VITAMIN D2) 50000 UNITS capsule, Take by mouth., Disp: , Rfl:  .  Hydrocodone-Acetaminophen (VICODIN ES) 7.5-300 MG TABS, Take by mouth., Disp: , Rfl:  .  Hydrocodone-Acetaminophen 7.5-300 MG TABS, Take 1 tablet by mouth every 6 (six) hours as needed (for pain). , Disp: , Rfl:  .  levothyroxine (SYNTHROID, LEVOTHROID) 100 MCG tablet, Take 100 mcg by mouth daily before breakfast. , Disp: , Rfl:  .  lisinopril (PRINIVIL,ZESTRIL) 10 MG tablet, Take 0.5 tablets (5 mg total) by mouth every morning. Take 5 mg every morning., Disp: 30 tablet, Rfl: 1  Allergies  Allergen Reactions  . Ciprofloxacin Hives and Diarrhea  . Oxycodone Diarrhea     Review of Systems  Respiratory: Negative for shortness of breath.   Cardiovascular: Negative for chest pain.  Neurological: Positive for dizziness.      Objective  Filed Vitals:   08/19/14 0957  BP: 100/71  Pulse: 114  Temp: 98.2 F (36.8 C)  TempSrc: Oral  Resp: 20  Height: 5\' 2"  (1.575 m)  Weight: 166 lb 1.6 oz (  75.342 kg)  SpO2: 92%    Physical Exam  Constitutional: She is oriented to person, place, and time and well-developed, well-nourished, and in no distress.  HENT:  Head: Normocephalic and atraumatic.  Cardiovascular: Regular rhythm.  Tachycardia present.   Pulmonary/Chest: Effort normal and breath sounds normal.  Neurological: She is alert and oriented to person, place, and time.  Psychiatric: Memory and judgment normal. Her mood appears anxious. She does not exhibit a depressed mood.  Nursing note and vitals reviewed.      Assessment & Plan 1. Panic disorder without agoraphobia In terms of panic disorder and generalized anxiety are stable and controlled on benzodiazepine therapy. Patient seems to be compliant with medication instructions. Refills provided and  follow-up in one month. - ALPRAZolam (XANAX) 0.5 MG tablet; Take 1 tablet (0.5 mg total) by mouth 3 (three) times daily as needed for anxiety.  Dispense: 90 tablet; Refill: 0 - ALPRAZolam (XANAX) 1 MG tablet; Take 1 tablet (1 mg total) by mouth at bedtime as needed for anxiety.  Dispense: 30 tablet; Refill: 0    Aws Shere [facility] 08/19/2014 10:12 AM

## 2014-09-10 ENCOUNTER — Telehealth: Payer: Self-pay | Admitting: Family Medicine

## 2014-09-10 MED ORDER — AMLODIPINE BESYLATE 5 MG PO TABS: 5.0000 mg | ORAL_TABLET | Freq: Every day | ORAL | Status: DC

## 2014-09-10 NOTE — Telephone Encounter (Signed)
Medication has been refilled and sent to [facility]

## 2014-09-16 ENCOUNTER — Ambulatory Visit (INDEPENDENT_AMBULATORY_CARE_PROVIDER_SITE_OTHER): Payer: PPO | Admitting: Family Medicine

## 2014-09-16 ENCOUNTER — Encounter: Payer: Self-pay | Admitting: Family Medicine

## 2014-09-16 VITALS — BP 108/70 | HR 96 | Temp 98.5°F | Resp 19 | Ht 62.0 in | Wt 169.2 lb

## 2014-09-16 DIAGNOSIS — F410 Panic disorder [episodic paroxysmal anxiety] without agoraphobia: Secondary | ICD-10-CM | POA: Diagnosis not present

## 2014-09-16 DIAGNOSIS — M545 Low back pain, unspecified: Secondary | ICD-10-CM | POA: Insufficient documentation

## 2014-09-16 DIAGNOSIS — G8929 Other chronic pain: Secondary | ICD-10-CM | POA: Insufficient documentation

## 2014-09-16 MED ORDER — ALPRAZOLAM 0.5 MG PO TABS: 0.5000 mg | ORAL_TABLET | Freq: Three times a day (TID) | ORAL | Status: DC | PRN

## 2014-09-16 MED ORDER — ALPRAZOLAM 1 MG PO TABS: 1.0000 mg | ORAL_TABLET | Freq: Every evening | ORAL | Status: DC | PRN

## 2014-09-16 NOTE — Progress Notes (Signed)
Name: [name]   MRN: [id number]    DOB: 04-24-1950   Date:09/16/2014       Progress Note  Subjective  Chief Complaint  Chief Complaint  Patient presents with  . Follow-up    4 wk.   . Medication Refill    Xanax    Anxiety Presents for follow-up visit. Symptoms include depressed mood, excessive worry, insomnia, irritability, nervous/anxious behavior and panic.   Past treatments include benzodiazephines and SSRIs. The treatment provided significant relief. Compliance with prior treatments has been good.      Past Medical History  Diagnosis Date  . Anxiety   . Anxiety attack   . GERD (gastroesophageal reflux disease)   . Hypertension   . Thyroid disease   . Chronic back pain     Past Surgical History  Procedure Laterality Date  . Abdominal hysterectomy    . Back surgery    . Bladder stimulator    . Hand surgery Right     Family History  Problem Relation Age of Onset  . Heart disease Mother   . COPD Father     Social History   Social History  . Marital Status: Widowed    Spouse Name: N/A  . Number of Children: N/A  . Years of Education: N/A   Occupational History  . Not on file.   Social History Main Topics  . Smoking status: Current Every Day Smoker -- 1.00 packs/day    Types: Cigarettes  . Smokeless tobacco: Not on file  . Alcohol Use: No  . Drug Use: No  . Sexual Activity: Not on file   Other Topics Concern  . Not on file   Social History Narrative     Current outpatient prescriptions:  .  ALPRAZolam (XANAX) 0.5 MG tablet, Take 1 tablet (0.5 mg total) by mouth 3 (three) times daily as needed for anxiety., Disp: 90 tablet, Rfl: 0 .  ALPRAZolam (XANAX) 1 MG tablet, Take 1 tablet (1 mg total) by mouth at bedtime as needed for anxiety., Disp: 30 tablet, Rfl: 0 .  amLODipine (NORVASC) 5 MG tablet, Take 1 tablet (5 mg total) by mouth daily., Disp: 30 tablet, Rfl: 0 .  citalopram (CELEXA) 20 MG tablet, Take 1 tablet (20 mg total) by mouth daily.,  Disp: 30 tablet, Rfl: 0 .  ergocalciferol (VITAMIN D2) 50000 UNITS capsule, Take by mouth., Disp: , Rfl:  .  Hydrocodone-Acetaminophen (VICODIN ES) 7.5-300 MG TABS, Take by mouth., Disp: , Rfl:  .  Hydrocodone-Acetaminophen 7.5-300 MG TABS, Take 1 tablet by mouth every 6 (six) hours as needed (for pain). , Disp: , Rfl:  .  levothyroxine (SYNTHROID, LEVOTHROID) 100 MCG tablet, Take 100 mcg by mouth daily before breakfast. , Disp: , Rfl:  .  lisinopril (PRINIVIL,ZESTRIL) 10 MG tablet, Take 0.5 tablets (5 mg total) by mouth every morning. Take 5 mg every morning., Disp: 30 tablet, Rfl: 1  Allergies  Allergen Reactions  . Ciprofloxacin Hives and Diarrhea  . Oxycodone Diarrhea  . Oxycodone-Acetaminophen Diarrhea     Review of Systems  Constitutional: Positive for irritability.  Psychiatric/Behavioral: The patient is nervous/anxious and has insomnia.       Objective  Filed Vitals:   09/16/14 1019  BP: 108/70  Pulse: 96  Temp: 98.5 F (36.9 C)  TempSrc: Oral  Resp: 19  Height: 5\' 2"  (1.575 m)  Weight: 169 lb 3.2 oz (76.749 kg)  SpO2: 90%    Physical Exam  Constitutional: She is oriented to  person, place, and time and well-developed, well-nourished, and in no distress.  Cardiovascular: Normal rate and regular rhythm.   Pulmonary/Chest: Effort normal and breath sounds normal.  Neurological: She is alert and oriented to person, place, and time.  Psychiatric: Memory, affect and judgment normal.  Nursing note and vitals reviewed.    Assessment & Plan  1. Panic disorder without agoraphobia Symptoms are stable on present therapy. Patient is taking the medication as directed. She is aware of the dependence potential of benzodiazepines and the potential interactions with other drugs including opioids. Refills provided and follow-up in one month. - ALPRAZolam (XANAX) 0.5 MG tablet; Take 1 tablet (0.5 mg total) by mouth 3 (three) times daily as needed for anxiety.  Dispense: 90  tablet; Refill: 0 - ALPRAZolam (XANAX) 1 MG tablet; Take 1 tablet (1 mg total) by mouth at bedtime as needed for anxiety.  Dispense: 30 tablet; Refill: 0 .  Onesimo Lingard [facility] 09/16/2014 10:34 AM

## 2014-10-13 ENCOUNTER — Telehealth: Payer: Self-pay | Admitting: Family Medicine

## 2014-10-13 ENCOUNTER — Ambulatory Visit (INDEPENDENT_AMBULATORY_CARE_PROVIDER_SITE_OTHER): Payer: PPO | Admitting: Family Medicine

## 2014-10-13 ENCOUNTER — Encounter: Payer: Self-pay | Admitting: Family Medicine

## 2014-10-13 VITALS — BP 110/69 | HR 98 | Temp 98.5°F | Resp 18 | Ht 62.0 in | Wt 167.8 lb

## 2014-10-13 DIAGNOSIS — F32A Depression, unspecified: Secondary | ICD-10-CM

## 2014-10-13 DIAGNOSIS — F410 Panic disorder [episodic paroxysmal anxiety] without agoraphobia: Secondary | ICD-10-CM

## 2014-10-13 DIAGNOSIS — I10 Essential (primary) hypertension: Secondary | ICD-10-CM | POA: Diagnosis not present

## 2014-10-13 DIAGNOSIS — F329 Major depressive disorder, single episode, unspecified: Secondary | ICD-10-CM | POA: Diagnosis not present

## 2014-10-13 MED ORDER — CITALOPRAM HYDROBROMIDE 20 MG PO TABS: 20.0000 mg | ORAL_TABLET | Freq: Every day | ORAL | Status: DC

## 2014-10-13 MED ORDER — ALPRAZOLAM 1 MG PO TABS: 1.0000 mg | ORAL_TABLET | Freq: Every evening | ORAL | Status: DC | PRN

## 2014-10-13 MED ORDER — AMLODIPINE BESYLATE 5 MG PO TABS: 5.0000 mg | ORAL_TABLET | Freq: Every day | ORAL | Status: DC

## 2014-10-13 MED ORDER — ALPRAZOLAM 0.5 MG PO TABS: 0.5000 mg | ORAL_TABLET | Freq: Three times a day (TID) | ORAL | Status: DC | PRN

## 2014-10-13 NOTE — Progress Notes (Signed)
Name: [name]   MRN: [id number]    DOB: 10-24-1950   Date:10/13/2014       Progress Note  Subjective  Chief Complaint  Chief Complaint  Patient presents with  . Follow-up    1 mo.   . Medication Refill    Xanax, Celexa    Anxiety Presents for follow-up visit. Symptoms include excessive worry, insomnia, nervous/anxious behavior, panic and restlessness. Patient reports no chest pain, palpitations or shortness of breath.   Past treatments include benzodiazephines.  Depression        This is a chronic problem.  Associated symptoms include fatigue, insomnia, restlessness, decreased interest and sad.  Associated symptoms include no appetite change and no headaches.  Past treatments include SSRIs - Selective serotonin reuptake inhibitors.  Past medical history includes anxiety.   Hypertension This is a chronic problem. The problem is controlled. Associated symptoms include anxiety. Pertinent negatives include no chest pain, headaches, palpitations or shortness of breath. Past treatments include calcium channel blockers. There is no history of kidney disease, CAD/MI or CVA.    Past Medical History  Diagnosis Date  . Anxiety   . Anxiety attack   . GERD (gastroesophageal reflux disease)   . Hypertension   . Thyroid disease   . Chronic back pain     Past Surgical History  Procedure Laterality Date  . Abdominal hysterectomy    . Back surgery    . Bladder stimulator    . Hand surgery Right     Family History  Problem Relation Age of Onset  . Heart disease Mother   . COPD Father     Social History   Social History  . Marital Status: Widowed    Spouse Name: N/A  . Number of Children: N/A  . Years of Education: N/A   Occupational History  . Not on file.   Social History Main Topics  . Smoking status: Current Every Day Smoker -- 1.00 packs/day    Types: Cigarettes  . Smokeless tobacco: Not on file  . Alcohol Use: No  . Drug Use: No  . Sexual Activity: Not on file    Other Topics Concern  . Not on file   Social History Narrative     Current outpatient prescriptions:  .  ALPRAZolam (XANAX) 0.5 MG tablet, Take 1 tablet (0.5 mg total) by mouth 3 (three) times daily as needed for anxiety., Disp: 90 tablet, Rfl: 0 .  ALPRAZolam (XANAX) 1 MG tablet, Take 1 tablet (1 mg total) by mouth at bedtime as needed for anxiety., Disp: 30 tablet, Rfl: 0 .  amLODipine (NORVASC) 5 MG tablet, Take 1 tablet (5 mg total) by mouth daily., Disp: 30 tablet, Rfl: 0 .  cephALEXin (KEFLEX) 500 MG capsule, TAKE ONE CAPSULE BY MOUTH THREE TIMES DAILY FOR SEVEN DAYS, Disp: , Rfl: 0 .  citalopram (CELEXA) 20 MG tablet, Take 1 tablet (20 mg total) by mouth daily., Disp: 30 tablet, Rfl: 0 .  ergocalciferol (VITAMIN D2) 50000 UNITS capsule, Take by mouth., Disp: , Rfl:  .  Hydrocodone-Acetaminophen (VICODIN ES) 7.5-300 MG TABS, Take by mouth., Disp: , Rfl:  .  Hydrocodone-Acetaminophen (VICODIN ES) 7.5-300 MG TABS, Take by mouth., Disp: , Rfl:  .  Hydrocodone-Acetaminophen 7.5-300 MG TABS, Take 1 tablet by mouth every 6 (six) hours as needed (for pain). , Disp: , Rfl:  .  hydrOXYzine (ATARAX/VISTARIL) 25 MG tablet, Take by mouth at bedtime., Disp: , Rfl: 2 .  levothyroxine (SYNTHROID, LEVOTHROID) 100 MCG  tablet, Take 100 mcg by mouth daily before breakfast. , Disp: , Rfl:  .  lisinopril (PRINIVIL,ZESTRIL) 10 MG tablet, Take 0.5 tablets (5 mg total) by mouth every morning. Take 5 mg every morning., Disp: 30 tablet, Rfl: 1  Allergies  Allergen Reactions  . Ciprofloxacin Hives and Diarrhea  . Oxycodone Diarrhea  . Oxycodone-Acetaminophen Diarrhea     Review of Systems  Constitutional: Positive for fatigue. Negative for appetite change.  Respiratory: Negative for shortness of breath.   Cardiovascular: Negative for chest pain and palpitations.  Neurological: Negative for headaches.  Psychiatric/Behavioral: Positive for depression. The patient is nervous/anxious and has insomnia.     Objective  Filed Vitals:   10/13/14 1629  BP: 110/69  Pulse: 98  Temp: 98.5 F (36.9 C)  TempSrc: Oral  Resp: 18  Height: 5\' 2"  (1.575 m)  Weight: 167 lb 12.8 oz (76.114 kg)  SpO2: 91%    Physical Exam  Constitutional: She is oriented to person, place, and time and well-developed, well-nourished, and in no distress.  Cardiovascular: Normal rate and regular rhythm.   Pulmonary/Chest: Effort normal and breath sounds normal.  Musculoskeletal: She exhibits no edema.  Neurological: She is alert and oriented to person, place, and time.  Nursing note and vitals reviewed.  Assessment & Plan  1. Benign hypertension  - amLODipine (NORVASC) 5 MG tablet; Take 1 tablet (5 mg total) by mouth daily.  Dispense: 90 tablet; Refill: 0  2. Panic disorder without agoraphobia  Symptoms of anxiety and panic disorder are stable and controlled on alprazolam 3 times a day and daily at bedtime as needed. Patient is aware of the dependence potential of benzodiazepines. Refills provided and follow-up in one month.  - ALPRAZolam (XANAX) 0.5 MG tablet; Take 1 tablet (0.5 mg total) by mouth 3 (three) times daily as needed for anxiety.  Dispense: 90 tablet; Refill: 0 - ALPRAZolam (XANAX) 1 MG tablet; Take 1 tablet (1 mg total) by mouth at bedtime as needed for anxiety.  Dispense: 30 tablet; Refill: 0  3. Clinical depression  - citalopram (CELEXA) 20 MG tablet; Take 1 tablet (20 mg total) by mouth daily.  Dispense: 90 tablet; Refill: 0   [name] [name] 10/13/2014 5:34 PM

## 2014-10-13 NOTE — Telephone Encounter (Signed)
Medication will be refilled today 10/13/2014 at office visit

## 2014-10-13 NOTE — Telephone Encounter (Signed)
Patient is completely out of Celexa. The pharmacy gave her 3 pills to help out. Please send prescription to [facility]

## 2014-10-18 ENCOUNTER — Ambulatory Visit: Payer: Self-pay | Admitting: Family Medicine

## 2014-11-11 ENCOUNTER — Ambulatory Visit (INDEPENDENT_AMBULATORY_CARE_PROVIDER_SITE_OTHER): Payer: Self-pay | Admitting: Family Medicine

## 2014-11-11 ENCOUNTER — Encounter: Payer: Self-pay | Admitting: Family Medicine

## 2014-11-11 VITALS — BP 110/62 | HR 111 | Temp 97.8°F | Resp 20 | Ht 62.0 in | Wt 167.3 lb

## 2014-11-11 DIAGNOSIS — F410 Panic disorder [episodic paroxysmal anxiety] without agoraphobia: Secondary | ICD-10-CM

## 2014-11-11 MED ORDER — ALPRAZOLAM 0.5 MG PO TABS: 0.5000 mg | ORAL_TABLET | Freq: Three times a day (TID) | ORAL | Status: DC | PRN

## 2014-11-11 MED ORDER — ALPRAZOLAM 1 MG PO TABS: 1.0000 mg | ORAL_TABLET | Freq: Every evening | ORAL | Status: DC | PRN

## 2014-11-11 NOTE — Progress Notes (Signed)
Name: [name]   MRN: [id number]    DOB: 1950-04-24   Date:11/11/2014       Progress Note  Subjective  Chief Complaint  Chief Complaint  Patient presents with  . Follow-up    1 mo  . Medication Refill   Anxiety Presents for follow-up visit. Symptoms include depressed mood, excessive worry, insomnia, nervous/anxious behavior and panic. Patient reports no chest pain, shortness of breath or suicidal ideas.   Past treatments include benzodiazephines and SSRIs. The treatment provided significant relief. Compliance with prior treatments has been good.   Past Medical History  Diagnosis Date  . Anxiety   . Anxiety attack   . GERD (gastroesophageal reflux disease)   . Hypertension   . Thyroid disease   . Chronic back pain     Past Surgical History  Procedure Laterality Date  . Abdominal hysterectomy    . Back surgery    . Bladder stimulator    . Hand surgery Right    Family History  Problem Relation Age of Onset  . Heart disease Mother   . COPD Father     Social History   Social History  . Marital Status: Widowed    Spouse Name: N/A  . Number of Children: N/A  . Years of Education: N/A   Occupational History  . Not on file.   Social History Main Topics  . Smoking status: Current Every Day Smoker -- 1.00 packs/day    Types: Cigarettes  . Smokeless tobacco: Not on file  . Alcohol Use: No  . Drug Use: No  . Sexual Activity: Not on file   Other Topics Concern  . Not on file   Social History Narrative     Current outpatient prescriptions:  .  ALPRAZolam (XANAX) 0.5 MG tablet, Take 1 tablet (0.5 mg total) by mouth 3 (three) times daily as needed for anxiety., Disp: 90 tablet, Rfl: 0 .  ALPRAZolam (XANAX) 1 MG tablet, Take 1 tablet (1 mg total) by mouth at bedtime as needed for anxiety., Disp: 30 tablet, Rfl: 0 .  amLODipine (NORVASC) 5 MG tablet, Take 1 tablet (5 mg total) by mouth daily., Disp: 90 tablet, Rfl: 0 .  cephALEXin (KEFLEX) 500 MG capsule, TAKE ONE  CAPSULE BY MOUTH THREE TIMES DAILY FOR SEVEN DAYS, Disp: , Rfl: 0 .  citalopram (CELEXA) 20 MG tablet, Take 1 tablet (20 mg total) by mouth daily., Disp: 90 tablet, Rfl: 0 .  ergocalciferol (VITAMIN D2) 50000 UNITS capsule, Take by mouth., Disp: , Rfl:  .  Hydrocodone-Acetaminophen (VICODIN ES) 7.5-300 MG TABS, Take by mouth., Disp: , Rfl:  .  Hydrocodone-Acetaminophen (VICODIN ES) 7.5-300 MG TABS, Take by mouth., Disp: , Rfl:  .  Hydrocodone-Acetaminophen 7.5-300 MG TABS, Take 1 tablet by mouth every 6 (six) hours as needed (for pain). , Disp: , Rfl:  .  hydrOXYzine (ATARAX/VISTARIL) 25 MG tablet, Take by mouth at bedtime., Disp: , Rfl: 2 .  levothyroxine (SYNTHROID, LEVOTHROID) 100 MCG tablet, Take 100 mcg by mouth daily before breakfast. , Disp: , Rfl:  .  lisinopril (PRINIVIL,ZESTRIL) 10 MG tablet, Take 0.5 tablets (5 mg total) by mouth every morning. Take 5 mg every morning., Disp: 30 tablet, Rfl: 1  Allergies  Allergen Reactions  . Ciprofloxacin Hives and Diarrhea  . Oxycodone Diarrhea  . Oxycodone-Acetaminophen Diarrhea   Review of Systems  Respiratory: Negative for shortness of breath.   Cardiovascular: Negative for chest pain.  Psychiatric/Behavioral: Negative for suicidal ideas. The patient is  nervous/anxious and has insomnia.    Objective  Filed Vitals:   11/11/14 1024  BP: 110/62  Pulse: 111  Temp: 97.8 F (36.6 C)  TempSrc: Oral  Resp: 20  Height: 5\' 2"  (1.575 m)  Weight: 167 lb 4.8 oz (75.887 kg)  SpO2: 94%    Physical Exam  Constitutional: She is well-developed, well-nourished, and in no distress.  Cardiovascular: Normal rate and regular rhythm.   Pulmonary/Chest: Effort normal and breath sounds normal.  Neurological: She is alert.  Psychiatric: Memory, affect and judgment normal. Her mood appears anxious.  Nursing note and vitals reviewed.  Assessment & Plan  1. Panic disorder without agoraphobia Symptoms of anxiety and panic disorder are stable and  controlled on daily alprazolam. Patient compliant with therapy and aware of the dependence potential for benzodiazepines. Refills provided and follow-up in one month. - ALPRAZolam (XANAX) 0.5 MG tablet; Take 1 tablet (0.5 mg total) by mouth 3 (three) times daily as needed for anxiety.  Dispense: 90 tablet; Refill: 0 - ALPRAZolam (XANAX) 1 MG tablet; Take 1 tablet (1 mg total) by mouth at bedtime as needed for anxiety.  Dispense: 30 tablet; Refill: 0   Carolin Quang [facility] 11/11/2014 10:40 AM

## 2014-12-09 ENCOUNTER — Ambulatory Visit (INDEPENDENT_AMBULATORY_CARE_PROVIDER_SITE_OTHER): Payer: PPO | Admitting: Family Medicine

## 2014-12-09 ENCOUNTER — Encounter: Payer: Self-pay | Admitting: Family Medicine

## 2014-12-09 VITALS — BP 110/64 | HR 112 | Temp 98.0°F | Resp 19 | Ht 62.0 in | Wt 169.2 lb

## 2014-12-09 DIAGNOSIS — I10 Essential (primary) hypertension: Secondary | ICD-10-CM

## 2014-12-09 DIAGNOSIS — F410 Panic disorder [episodic paroxysmal anxiety] without agoraphobia: Secondary | ICD-10-CM | POA: Diagnosis not present

## 2014-12-09 MED ORDER — ALPRAZOLAM 1 MG PO TABS: 1.0000 mg | ORAL_TABLET | Freq: Every evening | ORAL | Status: DC | PRN

## 2014-12-09 MED ORDER — ALPRAZOLAM 0.5 MG PO TABS: 0.5000 mg | ORAL_TABLET | Freq: Three times a day (TID) | ORAL | Status: DC | PRN

## 2014-12-09 NOTE — Progress Notes (Signed)
Name: [name]   MRN: [id number]    DOB: [birth year]   Date:12/09/2014       Progress Note  Subjective  Chief Complaint  Chief Complaint  Patient presents with  . Medication Refill    Anxiety Presents for follow-up visit. The problem has been gradually worsening (worried about youngest son having a 'drinking problem'). Symptoms include depressed mood, excessive worry, insomnia, nervous/anxious behavior and panic. Patient reports no chest pain, shortness of breath or suicidal ideas. Symptoms occur most days. The severity of symptoms is severe. The symptoms are aggravated by family issues.   Her past medical history is significant for anxiety/panic attacks and depression. Past treatments include benzodiazephines and SSRIs. The treatment provided significant relief. Compliance with prior treatments has been good.   Past Medical History  Diagnosis Date  . Anxiety   . Anxiety attack   . GERD (gastroesophageal reflux disease)   . Hypertension   . Thyroid disease   . Chronic back pain     Past Surgical History  Procedure Laterality Date  . Abdominal hysterectomy    . Back surgery    . Bladder stimulator    . Hand surgery Right     Family History  Problem Relation Age of Onset  . Heart disease Mother   . COPD Father     Social History   Social History  . Marital Status: Widowed    Spouse Name: N/A  . Number of Children: N/A  . Years of Education: N/A   Occupational History  . Not on file.   Social History Main Topics  . Smoking status: Current Every Day Smoker -- 1.00 packs/day    Types: Cigarettes  . Smokeless tobacco: Not on file  . Alcohol Use: No  . Drug Use: No  . Sexual Activity: Not on file   Other Topics Concern  . Not on file   Social History Narrative     Current outpatient prescriptions:  .  ALPRAZolam (XANAX) 0.5 MG tablet, Take 1 tablet (0.5 mg total) by mouth 3 (three) times daily as needed for anxiety., Disp: 90 tablet, Rfl: 0 .  ALPRAZolam  (XANAX) 1 MG tablet, Take 1 tablet (1 mg total) by mouth at bedtime as needed for anxiety., Disp: 30 tablet, Rfl: 0 .  amLODipine (NORVASC) 5 MG tablet, Take 1 tablet (5 mg total) by mouth daily., Disp: 90 tablet, Rfl: 0 .  cephALEXin (KEFLEX) 500 MG capsule, TAKE ONE CAPSULE BY MOUTH THREE TIMES DAILY FOR SEVEN DAYS, Disp: , Rfl: 0 .  citalopram (CELEXA) 20 MG tablet, Take 1 tablet (20 mg total) by mouth daily., Disp: 90 tablet, Rfl: 0 .  ergocalciferol (VITAMIN D2) 50000 UNITS capsule, Take by mouth., Disp: , Rfl:  .  Hydrocodone-Acetaminophen (VICODIN ES) 7.5-300 MG TABS, Take by mouth., Disp: , Rfl:  .  Hydrocodone-Acetaminophen (VICODIN ES) 7.5-300 MG TABS, Take by mouth., Disp: , Rfl:  .  Hydrocodone-Acetaminophen 7.5-300 MG TABS, Take 1 tablet by mouth every 6 (six) hours as needed (for pain). , Disp: , Rfl:  .  hydrOXYzine (ATARAX/VISTARIL) 25 MG tablet, Take by mouth at bedtime., Disp: , Rfl: 2 .  levothyroxine (SYNTHROID, LEVOTHROID) 100 MCG tablet, Take 100 mcg by mouth daily before breakfast. , Disp: , Rfl:  .  lisinopril (PRINIVIL,ZESTRIL) 10 MG tablet, Take 0.5 tablets (5 mg total) by mouth every morning. Take 5 mg every morning., Disp: 30 tablet, Rfl: 1  Allergies  Allergen Reactions  . Ciprofloxacin Hives and Diarrhea  .  Oxycodone Diarrhea  . Oxycodone-Acetaminophen Diarrhea   Review of Systems  Respiratory: Negative for shortness of breath.   Cardiovascular: Negative for chest pain.  Psychiatric/Behavioral: Negative for suicidal ideas. The patient is nervous/anxious and has insomnia.    Objective  Filed Vitals:   12/09/14 1045  BP: 110/64  Pulse: 112  Temp: 98 F (36.7 C)  TempSrc: Oral  Resp: 19  Height: 5\' 2"  (1.575 m)  Weight: 169 lb 3.2 oz (76.749 kg)  SpO2: 93%    Physical Exam  Constitutional: She is oriented to person, place, and time and well-developed, well-nourished, and in no distress.  Cardiovascular: Normal rate, regular rhythm and normal heart  sounds.   Pulmonary/Chest: Effort normal and breath sounds normal.  Neurological: She is alert and oriented to person, place, and time.  Psychiatric: Memory and judgment normal. Her mood appears anxious. She exhibits a depressed mood.  Nursing note and vitals reviewed.   Assessment & Plan  1. Panic disorder without agoraphobia Symptoms stable on daily benzodiazepine therapy. She'll aware of the dependence potential and side effects of alprazolam. Refills provided and follow-up in one month. - ALPRAZolam (XANAX) 0.5 MG tablet; Take 1 tablet (0.5 mg total) by mouth 3 (three) times daily as needed for anxiety.  Dispense: 90 tablet; Refill: 0 - ALPRAZolam (XANAX) 1 MG tablet; Take 1 tablet (1 mg total) by mouth at bedtime as needed for anxiety.  Dispense: 30 tablet; Refill: 0   Jaxson Anglin [facility] 12/09/2014 10:58 AM

## 2014-12-15 ENCOUNTER — Encounter: Payer: Self-pay | Admitting: Family Medicine

## 2014-12-23 ENCOUNTER — Ambulatory Visit (INDEPENDENT_AMBULATORY_CARE_PROVIDER_SITE_OTHER): Payer: PPO | Admitting: Family Medicine

## 2014-12-23 ENCOUNTER — Encounter: Payer: Self-pay | Admitting: Family Medicine

## 2014-12-23 ENCOUNTER — Ambulatory Visit
Admission: RE | Admit: 2014-12-23 | Discharge: 2014-12-23 | Disposition: A | Discharge status: Home / Self Care | Payer: PPO | Source: Ambulatory Visit | Attending: Family Medicine | Admitting: Family Medicine

## 2014-12-23 VITALS — HR 106 | Temp 98.7°F | Resp 20 | Ht 62.0 in | Wt 171.7 lb

## 2014-12-23 DIAGNOSIS — R05 Cough: Secondary | ICD-10-CM | POA: Diagnosis not present

## 2014-12-23 DIAGNOSIS — R058 Other specified cough: Secondary | ICD-10-CM

## 2014-12-23 DIAGNOSIS — J0100 Acute maxillary sinusitis, unspecified: Secondary | ICD-10-CM

## 2014-12-23 MED ORDER — AMOXICILLIN-POT CLAVULANATE 875-125 MG PO TABS: 1.0000 | ORAL_TABLET | Freq: Two times a day (BID) | ORAL | Status: DC

## 2014-12-23 MED ORDER — BENZONATATE 200 MG PO CAPS: 200.0000 mg | ORAL_CAPSULE | Freq: Three times a day (TID) | ORAL | Status: DC | PRN

## 2014-12-23 NOTE — Progress Notes (Signed)
Name: [name]   MRN: [id number]    DOB: [birth year]   Date:12/23/2014       Progress Note  Subjective  Chief Complaint  Chief Complaint  Patient presents with  . Acute Visit    Possi Bronchitis/cough   Cough This is a new problem. The current episode started in the past 7 days (6 days ago). The cough is productive of sputum (clear mucus but did see some blood in it a few days ago). Associated symptoms include chest pain (with coughing), chills, ear pain (left ear 'popping'), headaches, nasal congestion, a sore throat and shortness of breath. Pertinent negatives include no fever or hemoptysis. She has tried nothing for the symptoms.    Past Medical History  Diagnosis Date  . GERD (gastroesophageal reflux disease)   . Hypertension   . Chronic back pain     Past Surgical History  Procedure Laterality Date  . Abdominal hysterectomy    . Back surgery    . Bladder stimulator    . Hand surgery Right     Family History  Problem Relation Age of Onset  . Heart disease Mother   . COPD Father     Social History   Social History  . Marital Status: Widowed    Spouse Name: N/A  . Number of Children: N/A  . Years of Education: N/A   Occupational History  . Not on file.   Social History Main Topics  . Smoking status: Current Every Day Smoker -- 1.00 packs/day    Types: Cigarettes  . Smokeless tobacco: Not on file  . Alcohol Use: No  . Drug Use: No  . Sexual Activity: Not on file   Other Topics Concern  . Not on file   Social History Narrative     Current outpatient prescriptions:  .  ALPRAZolam (XANAX) 0.5 MG tablet, Take 1 tablet (0.5 mg total) by mouth 3 (three) times daily as needed for anxiety., Disp: 90 tablet, Rfl: 0 .  ALPRAZolam (XANAX) 1 MG tablet, Take 1 tablet (1 mg total) by mouth at bedtime as needed for anxiety., Disp: 30 tablet, Rfl: 0 .  amLODipine (NORVASC) 5 MG tablet, Take 1 tablet (5 mg total) by mouth daily., Disp: 90 tablet, Rfl: 0 .   cephALEXin (KEFLEX) 500 MG capsule, TAKE ONE CAPSULE BY MOUTH THREE TIMES DAILY FOR SEVEN DAYS, Disp: , Rfl: 0 .  citalopram (CELEXA) 20 MG tablet, Take 1 tablet (20 mg total) by mouth daily., Disp: 90 tablet, Rfl: 0 .  ergocalciferol (VITAMIN D2) 50000 UNITS capsule, Take by mouth., Disp: , Rfl:  .  Hydrocodone-Acetaminophen 7.5-300 MG TABS, Take 1 tablet by mouth every 6 (six) hours as needed. for pain, Disp: , Rfl: 0 .  hydrOXYzine (ATARAX/VISTARIL) 25 MG tablet, Take by mouth at bedtime., Disp: , Rfl: 2 .  levothyroxine (SYNTHROID, LEVOTHROID) 100 MCG tablet, Take 100 mcg by mouth daily before breakfast. , Disp: , Rfl:  .  lisinopril (PRINIVIL,ZESTRIL) 10 MG tablet, Take 0.5 tablets (5 mg total) by mouth every morning. Take 5 mg every morning., Disp: 30 tablet, Rfl: 1  Allergies  Allergen Reactions  . Ciprofloxacin Hives and Diarrhea  . Oxycodone Diarrhea  . Oxycodone-Acetaminophen Diarrhea     Review of Systems  Constitutional: Positive for chills. Negative for fever.  HENT: Positive for ear pain (left ear 'popping') and sore throat.   Respiratory: Positive for cough and shortness of breath. Negative for hemoptysis.   Cardiovascular: Positive for chest pain (  with coughing).  Neurological: Positive for headaches.    Objective  Filed Vitals:   12/23/14 1139  Pulse: 106  Temp: 98.7 F (37.1 C)  TempSrc: Oral  Resp: 20  Height: 5\' 2"  (1.575 m)  Weight: 171 lb 11.2 oz (77.883 kg)  SpO2: 93%    Physical Exam  Constitutional: She is well-developed, well-nourished, and in no distress. She does not have a sickly appearance.  HENT:  Head: Normocephalic and atraumatic.  Right Ear: Tympanic membrane and ear canal normal.  Left Ear: Tympanic membrane and ear canal normal.  Nose: Right sinus exhibits maxillary sinus tenderness. Right sinus exhibits no frontal sinus tenderness. Left sinus exhibits maxillary sinus tenderness. Left sinus exhibits no frontal sinus tenderness.   Mouth/Throat: Posterior oropharyngeal edema and posterior oropharyngeal erythema present.  Neck: Neck supple.  Cardiovascular: S1 normal, S2 normal and normal heart sounds.   Pulmonary/Chest: Effort normal. She has decreased breath sounds in the right middle field and the right lower field. She has wheezes in the left upper field and the left middle field. She has no rhonchi. She has no rales.  Nursing note and vitals reviewed.   Assessment & Plan  1. Productive cough We will obtain chest x-ray for evaluation of abnormal lung exam.started on Tessalon - DG Chest 2 View; Future - benzonatate (TESSALON) 200 MG capsule; Take 1 capsule (200 mg total) by mouth 3 (three) times daily as needed for cough.  Dispense: 21 capsule; Refill: 0  2. Acute maxillary sinusitis, recurrence not specified  - amoxicillin-clavulanate (AUGMENTIN) 875-125 MG tablet; Take 1 tablet by mouth 2 (two) times daily.  Dispense: 20 tablet; Refill: 0   Tiffanni Scarfo [facility] 12/23/2014 11:56 AM

## 2015-01-07 ENCOUNTER — Encounter: Payer: Self-pay | Admitting: Family Medicine

## 2015-01-07 ENCOUNTER — Telehealth: Payer: Self-pay

## 2015-01-07 ENCOUNTER — Ambulatory Visit (INDEPENDENT_AMBULATORY_CARE_PROVIDER_SITE_OTHER): Payer: PPO | Admitting: Family Medicine

## 2015-01-07 VITALS — BP 112/68 | HR 108 | Temp 97.6°F | Resp 19 | Ht 62.0 in | Wt 172.0 lb

## 2015-01-07 DIAGNOSIS — F410 Panic disorder [episodic paroxysmal anxiety] without agoraphobia: Secondary | ICD-10-CM

## 2015-01-07 DIAGNOSIS — Z23 Encounter for immunization: Secondary | ICD-10-CM

## 2015-01-07 DIAGNOSIS — I10 Essential (primary) hypertension: Secondary | ICD-10-CM

## 2015-01-07 DIAGNOSIS — F329 Major depressive disorder, single episode, unspecified: Secondary | ICD-10-CM

## 2015-01-07 DIAGNOSIS — F32A Depression, unspecified: Secondary | ICD-10-CM

## 2015-01-07 MED ORDER — ALPRAZOLAM 1 MG PO TABS: 1.0000 mg | ORAL_TABLET | Freq: Every evening | ORAL | Status: DC | PRN

## 2015-01-07 MED ORDER — AMLODIPINE BESYLATE 5 MG PO TABS: 5.0000 mg | ORAL_TABLET | Freq: Every day | ORAL | Status: DC

## 2015-01-07 MED ORDER — ALPRAZOLAM 0.5 MG PO TABS: 0.5000 mg | ORAL_TABLET | Freq: Three times a day (TID) | ORAL | Status: DC | PRN

## 2015-01-07 MED ORDER — CITALOPRAM HYDROBROMIDE 20 MG PO TABS: 20.0000 mg | ORAL_TABLET | Freq: Every day | ORAL | Status: DC

## 2015-01-07 NOTE — Telephone Encounter (Signed)
Medication has been refilled and sent to [facility]. °

## 2015-01-07 NOTE — Progress Notes (Signed)
Name: [name]   MRN: [id number]    DOB: 1950/05/02   Date:01/07/2015       Progress Note  Subjective  Chief Complaint  Chief Complaint  Patient presents with  . Hypertension  . Medication Refill    xanax 0.5 mg    Anxiety Presents for follow-up visit. The problem has been unchanged. Symptoms include depressed mood, excessive worry, insomnia, nervous/anxious behavior and panic. Patient reports no chest pain, shortness of breath or suicidal ideas. Symptoms occur most days. The severity of symptoms is severe. The symptoms are aggravated by family issues.   Her past medical history is significant for anxiety/panic attacks and depression. Past treatments include benzodiazephines and SSRIs. The treatment provided significant relief. Compliance with prior treatments has been good.    Past Medical History  Diagnosis Date  . GERD (gastroesophageal reflux disease)   . Hypertension   . Chronic back pain     Past Surgical History  Procedure Laterality Date  . Abdominal hysterectomy    . Back surgery    . Bladder stimulator    . Hand surgery Right     Family History  Problem Relation Age of Onset  . Heart disease Mother   . COPD Father     Social History   Social History  . Marital Status: Widowed    Spouse Name: N/A  . Number of Children: N/A  . Years of Education: N/A   Occupational History  . Not on file.   Social History Main Topics  . Smoking status: Current Every Day Smoker -- 1.00 packs/day    Types: Cigarettes  . Smokeless tobacco: Not on file  . Alcohol Use: No  . Drug Use: No  . Sexual Activity: Not on file   Other Topics Concern  . Not on file   Social History Narrative     Current outpatient prescriptions:  .  ALPRAZolam (XANAX) 0.5 MG tablet, Take 1 tablet (0.5 mg total) by mouth 3 (three) times daily as needed for anxiety., Disp: 90 tablet, Rfl: 0 .  ALPRAZolam (XANAX) 1 MG tablet, Take 1 tablet (1 mg total) by mouth at bedtime as needed for  anxiety., Disp: 30 tablet, Rfl: 0 .  amLODipine (NORVASC) 5 MG tablet, Take 1 tablet (5 mg total) by mouth daily., Disp: 90 tablet, Rfl: 0 .  benzonatate (TESSALON) 200 MG capsule, Take 1 capsule (200 mg total) by mouth 3 (three) times daily as needed for cough., Disp: 21 capsule, Rfl: 0 .  cephALEXin (KEFLEX) 500 MG capsule, TAKE ONE CAPSULE BY MOUTH THREE TIMES DAILY FOR SEVEN DAYS, Disp: , Rfl: 0 .  citalopram (CELEXA) 20 MG tablet, Take 1 tablet (20 mg total) by mouth daily., Disp: 90 tablet, Rfl: 0 .  ergocalciferol (VITAMIN D2) 50000 UNITS capsule, Take by mouth., Disp: , Rfl:  .  hydrOXYzine (ATARAX/VISTARIL) 25 MG tablet, Take by mouth at bedtime., Disp: , Rfl: 2 .  levothyroxine (SYNTHROID, LEVOTHROID) 100 MCG tablet, Take 100 mcg by mouth daily before breakfast. , Disp: , Rfl:  .  lisinopril (PRINIVIL,ZESTRIL) 10 MG tablet, Take 0.5 tablets (5 mg total) by mouth every morning. Take 5 mg every morning., Disp: 30 tablet, Rfl: 1 .  amoxicillin-clavulanate (AUGMENTIN) 875-125 MG tablet, Take 1 tablet by mouth 2 (two) times daily. (Patient not taking: Reported on 01/07/2015), Disp: 20 tablet, Rfl: 0 .  Hydrocodone-Acetaminophen 7.5-300 MG TABS, Take 1 tablet by mouth every 6 (six) hours as needed. for pain, Disp: , Rfl: 0  Allergies  Allergen Reactions  . Ciprofloxacin Hives and Diarrhea  . Oxycodone Diarrhea  . Oxycodone-Acetaminophen Diarrhea     Review of Systems  Respiratory: Negative for shortness of breath.   Cardiovascular: Negative for chest pain.  Psychiatric/Behavioral: Negative for suicidal ideas. The patient is nervous/anxious and has insomnia.      Objective  Filed Vitals:   01/07/15 0930  BP: 112/68  Pulse: 108  Temp: 97.6 F (36.4 C)  TempSrc: Oral  Resp: 19  Height: 5\' 2"  (1.575 m)  Weight: 172 lb (78.019 kg)  SpO2: 92%    Physical Exam  Constitutional: She is oriented to person, place, and time and well-developed, well-nourished, and in no distress.   Cardiovascular: Normal rate, regular rhythm and normal heart sounds.   Pulmonary/Chest: Effort normal and breath sounds normal.  Neurological: She is alert and oriented to person, place, and time.  Psychiatric: Mood, memory, affect and judgment normal. Her mood appears not anxious. She does not exhibit a depressed mood.  Nursing note and vitals reviewed.   Assessment & Plan  1. Need for immunization against influenza  - Flu vaccine HIGH DOSE PF (Fluzone High dose)  2. Panic disorder without agoraphobia Stable on alprazolam taken 3 times daily and at bedtime. Refills provided. Follow-up in one month - ALPRAZolam (XANAX) 0.5 MG tablet; Take 1 tablet (0.5 mg total) by mouth 3 (three) times daily as needed for anxiety.  Dispense: 90 tablet; Refill: 0 - ALPRAZolam (XANAX) 1 MG tablet; Take 1 tablet (1 mg total) by mouth at bedtime as needed for anxiety.  Dispense: 30 tablet; Refill: 0    Carita Sollars [facility] 01/07/2015 9:57 AM

## 2015-01-10 ENCOUNTER — Other Ambulatory Visit: Payer: Self-pay

## 2015-01-10 DIAGNOSIS — F32A Depression, unspecified: Secondary | ICD-10-CM

## 2015-01-10 DIAGNOSIS — I10 Essential (primary) hypertension: Secondary | ICD-10-CM

## 2015-01-10 DIAGNOSIS — F329 Major depressive disorder, single episode, unspecified: Secondary | ICD-10-CM

## 2015-01-10 MED ORDER — AMLODIPINE BESYLATE 5 MG PO TABS: 5.0000 mg | ORAL_TABLET | Freq: Every day | ORAL | Status: DC

## 2015-01-10 MED ORDER — CITALOPRAM HYDROBROMIDE 20 MG PO TABS: 20.0000 mg | ORAL_TABLET | Freq: Every day | ORAL | Status: DC

## 2015-02-01 ENCOUNTER — Ambulatory Visit (INDEPENDENT_AMBULATORY_CARE_PROVIDER_SITE_OTHER): Payer: PPO | Admitting: Family Medicine

## 2015-02-01 ENCOUNTER — Encounter: Payer: Self-pay | Admitting: Family Medicine

## 2015-02-01 ENCOUNTER — Other Ambulatory Visit: Payer: Self-pay

## 2015-02-01 VITALS — BP 112/70 | HR 107 | Temp 98.1°F | Resp 20 | Ht 62.0 in | Wt 172.8 lb

## 2015-02-01 DIAGNOSIS — Z0000 Encounter for general adult medical examination without abnormal findings: Secondary | ICD-10-CM

## 2015-02-01 DIAGNOSIS — F410 Panic disorder [episodic paroxysmal anxiety] without agoraphobia: Secondary | ICD-10-CM

## 2015-02-01 DIAGNOSIS — R1032 Left lower quadrant pain: Secondary | ICD-10-CM | POA: Insufficient documentation

## 2015-02-01 MED ORDER — ALPRAZOLAM 1 MG PO TABS: 1.0000 mg | ORAL_TABLET | Freq: Every evening | ORAL | Status: DC | PRN

## 2015-02-01 MED ORDER — ALPRAZOLAM 0.5 MG PO TABS: 0.5000 mg | ORAL_TABLET | Freq: Three times a day (TID) | ORAL | Status: DC | PRN

## 2015-02-01 NOTE — Progress Notes (Signed)
Name: [name]   MRN: [id number]    DOB: 02/02/1951   Date:02/01/2015       Progress Note  Subjective  Chief Complaint  Chief Complaint  Patient presents with  . Annual Exam    CPE    HPI  Pt. Is here for a Complete Physical Exam. She is doing well.  Past Medical History  Diagnosis Date  . GERD (gastroesophageal reflux disease)   . Hypertension   . Chronic back pain     Past Surgical History  Procedure Laterality Date  . Abdominal hysterectomy    . Back surgery    . Bladder stimulator    . Hand surgery Right     Family History  Problem Relation Age of Onset  . Heart disease Mother   . COPD Father     Social History   Social History  . Marital Status: Widowed    Spouse Name: N/A  . Number of Children: N/A  . Years of Education: N/A   Occupational History  . Not on file.   Social History Main Topics  . Smoking status: Current Every Day Smoker -- 1.00 packs/day    Types: Cigarettes  . Smokeless tobacco: Not on file  . Alcohol Use: No  . Drug Use: No  . Sexual Activity: Not on file   Other Topics Concern  . Not on file   Social History Narrative     Current outpatient prescriptions:  .  ALPRAZolam (XANAX) 0.5 MG tablet, Take 1 tablet (0.5 mg total) by mouth 3 (three) times daily as needed for anxiety., Disp: 90 tablet, Rfl: 0 .  ALPRAZolam (XANAX) 1 MG tablet, Take 1 tablet (1 mg total) by mouth at bedtime as needed for anxiety., Disp: 30 tablet, Rfl: 0 .  amLODipine (NORVASC) 5 MG tablet, Take 1 tablet (5 mg total) by mouth daily., Disp: 90 tablet, Rfl: 0 .  amoxicillin-clavulanate (AUGMENTIN) 875-125 MG tablet, Take 1 tablet by mouth 2 (two) times daily., Disp: 20 tablet, Rfl: 0 .  benzonatate (TESSALON) 200 MG capsule, Take 1 capsule (200 mg total) by mouth 3 (three) times daily as needed for cough., Disp: 21 capsule, Rfl: 0 .  cephALEXin (KEFLEX) 500 MG capsule, TAKE ONE CAPSULE BY MOUTH THREE TIMES DAILY FOR SEVEN DAYS, Disp: , Rfl: 0 .   citalopram (CELEXA) 20 MG tablet, Take 1 tablet (20 mg total) by mouth daily., Disp: 90 tablet, Rfl: 0 .  ergocalciferol (VITAMIN D2) 50000 UNITS capsule, Take by mouth., Disp: , Rfl:  .  Hydrocodone-Acetaminophen 7.5-300 MG TABS, Take 1 tablet by mouth every 6 (six) hours as needed. for pain, Disp: , Rfl: 0 .  hydrOXYzine (ATARAX/VISTARIL) 25 MG tablet, Take by mouth at bedtime., Disp: , Rfl: 2 .  lisinopril (PRINIVIL,ZESTRIL) 10 MG tablet, Take 0.5 tablets (5 mg total) by mouth every morning. Take 5 mg every morning., Disp: 30 tablet, Rfl: 1 .  levothyroxine (SYNTHROID, LEVOTHROID) 100 MCG tablet, Take 100 mcg by mouth daily before breakfast. , Disp: , Rfl:   Allergies  Allergen Reactions  . Ciprofloxacin Hives and Diarrhea  . Oxycodone Diarrhea  . Oxycodone-Acetaminophen Diarrhea     Review of Systems  Constitutional: Positive for malaise/fatigue. Negative for fever, chills and weight loss.  HENT: Negative for congestion, ear pain and sore throat.   Eyes: Positive for blurred vision (cataracts) and pain (being followed by Ophthalmology). Negative for double vision and discharge.  Respiratory: Negative for cough, sputum production and shortness of breath.  Cardiovascular: Negative for chest pain and palpitations.  Gastrointestinal: Negative for heartburn, nausea, vomiting, abdominal pain, diarrhea, constipation, blood in stool and melena.  Genitourinary: Negative for dysuria, frequency and hematuria.  Musculoskeletal: Positive for myalgias (occasional cramps in hip and legs) and back pain. Negative for joint pain.  Skin: Negative for itching and rash.  Neurological: Negative for dizziness and headaches.  Psychiatric/Behavioral: Positive for depression. Negative for suicidal ideas and substance abuse. The patient is nervous/anxious. The patient does not have insomnia.     Objective  Filed Vitals:   02/01/15 0941  BP: 112/70  Pulse: 107  Temp: 98.1 F (36.7 C)  TempSrc: Oral    Resp: 20  Height: 5\' 2"  (1.575 m)  Weight: 172 lb 12.8 oz (78.382 kg)  SpO2: 93%    Physical Exam  Constitutional: She is well-developed, well-nourished, and in no distress.  HENT:  Head: Normocephalic and atraumatic.  Eyes: Conjunctivae are normal. Pupils are equal, round, and reactive to light.  Neck: Normal range of motion. Neck supple.  Cardiovascular: Normal rate and regular rhythm.   No murmur heard. Pulmonary/Chest: Effort normal and breath sounds normal.  Abdominal: Soft. There is tenderness in the left lower quadrant. There is negative Murphy's sign.    Musculoskeletal:       Right ankle: She exhibits no swelling.       Left ankle: She exhibits no swelling.  Nursing note and vitals reviewed.   Assessment & Plan  1. Annual physical exam  - MM Digital Screening; Future - CBC with Differential - Comprehensive Metabolic Panel (CMET) - Lipid Profile - TSH - Vitamin D (25 hydroxy)  2. Abdominal pain, LLQ Persistent left lower quadrant and left-sided pelvic pain, concerning for ovarian pathology. Urgent referral to gynecology. Obtain pelvic ultrasound. Advised to seek immediate medical attention if her symptoms become progressive and/or get worse. Verbalized agreement. - Ambulatory referral to Gynecology - US Transvaginal Non-OB; Future    Tiphanie Vo [facility] 02/01/2015 9:54 AM

## 2015-02-01 NOTE — Telephone Encounter (Signed)
Medication has been refilled and patient will pick up in office on 02/03/2015

## 2015-02-03 ENCOUNTER — Other Ambulatory Visit: Payer: Self-pay | Admitting: Family Medicine

## 2015-02-03 DIAGNOSIS — R1032 Left lower quadrant pain: Secondary | ICD-10-CM

## 2015-02-04 LAB — CBC WITH DIFFERENTIAL/PLATELET
Basophils Absolute: 0.1 10*3/uL (ref 0.0–0.2)
Basos: 1 %
EOS (ABSOLUTE): 0.3 10*3/uL (ref 0.0–0.4)
EOS: 4 %
HEMATOCRIT: 45.1 % (ref 34.0–46.6)
Hemoglobin: 15.3 g/dL (ref 11.1–15.9)
IMMATURE GRANULOCYTES: 0 %
Immature Grans (Abs): 0 10*3/uL (ref 0.0–0.1)
LYMPHS ABS: 3 10*3/uL (ref 0.7–3.1)
Lymphs: 41 %
MCH: 31.1 pg (ref 26.6–33.0)
MCHC: 33.9 g/dL (ref 31.5–35.7)
MCV: 92 fL (ref 79–97)
MONOS ABS: 0.7 10*3/uL (ref 0.1–0.9)
Monocytes: 9 %
NEUTROS PCT: 45 %
Neutrophils Absolute: 3.3 10*3/uL (ref 1.4–7.0)
PLATELETS: 277 10*3/uL (ref 150–379)
RBC: 4.92 x10E6/uL (ref 3.77–5.28)
RDW: 13.7 % (ref 12.3–15.4)
WBC: 7.3 10*3/uL (ref 3.4–10.8)

## 2015-02-04 LAB — COMPREHENSIVE METABOLIC PANEL
A/G RATIO: 1.5 (ref 1.1–2.5)
ALBUMIN: 4.2 g/dL (ref 3.6–4.8)
ALK PHOS: 111 IU/L (ref 39–117)
ALT: 21 IU/L (ref 0–32)
AST: 22 IU/L (ref 0–40)
BILIRUBIN TOTAL: 0.4 mg/dL (ref 0.0–1.2)
BUN / CREAT RATIO: 13 (ref 11–26)
BUN: 11 mg/dL (ref 8–27)
CHLORIDE: 98 mmol/L (ref 96–106)
CO2: 25 mmol/L (ref 18–29)
Calcium: 9.7 mg/dL (ref 8.7–10.3)
Creatinine, Ser: 0.84 mg/dL (ref 0.57–1.00)
GFR calc non Af Amer: 73 mL/min/{1.73_m2} (ref >59)
GFR, EST AFRICAN AMERICAN: 84 mL/min/{1.73_m2} (ref >59)
GLOBULIN, TOTAL: 2.8 g/dL (ref 1.5–4.5)
Glucose: 98 mg/dL (ref 65–99)
POTASSIUM: 4.1 mmol/L (ref 3.5–5.2)
SODIUM: 141 mmol/L (ref 134–144)
TOTAL PROTEIN: 7 g/dL (ref 6.0–8.5)

## 2015-02-04 LAB — LIPID PANEL
CHOLESTEROL TOTAL: 205 mg/dL — AB (ref 100–199)
Chol/HDL Ratio: 5.7 ratio units — ABNORMAL HIGH (ref 0.0–4.4)
HDL: 36 mg/dL — ABNORMAL LOW (ref >39)
LDL Calculated: 123 mg/dL — ABNORMAL HIGH (ref 0–99)
Triglycerides: 232 mg/dL — ABNORMAL HIGH (ref 0–149)
VLDL CHOLESTEROL CAL: 46 mg/dL — AB (ref 5–40)

## 2015-02-04 LAB — VITAMIN D 25 HYDROXY (VIT D DEFICIENCY, FRACTURES): Vit D, 25-Hydroxy: 13.4 ng/mL — ABNORMAL LOW (ref 30.0–100.0)

## 2015-02-04 LAB — TSH: TSH: 1.94 u[IU]/mL (ref 0.450–4.500)

## 2015-02-09 ENCOUNTER — Ambulatory Visit
Admission: RE | Admit: 2015-02-09 | Discharge: 2015-02-09 | Disposition: A | Discharge status: Home / Self Care | Payer: PPO | Source: Ambulatory Visit | Attending: Family Medicine | Admitting: Family Medicine

## 2015-02-09 DIAGNOSIS — N83292 Other ovarian cyst, left side: Secondary | ICD-10-CM | POA: Insufficient documentation

## 2015-02-09 DIAGNOSIS — R1032 Left lower quadrant pain: Secondary | ICD-10-CM | POA: Insufficient documentation

## 2015-02-10 ENCOUNTER — Other Ambulatory Visit: Payer: Self-pay

## 2015-02-10 MED ORDER — VITAMIN D (ERGOCALCIFEROL) 1.25 MG (50000 UNIT) PO CAPS: 50000.0000 [IU] | ORAL_CAPSULE | ORAL | Status: DC

## 2015-02-10 NOTE — Telephone Encounter (Signed)
Prescription for Vitamin D3 50,000 units has been sent to [facility] per Dr. Manuella Ghazi

## 2015-02-17 ENCOUNTER — Ambulatory Visit (INDEPENDENT_AMBULATORY_CARE_PROVIDER_SITE_OTHER): Payer: PPO | Admitting: Obstetrics and Gynecology

## 2015-02-17 ENCOUNTER — Other Ambulatory Visit: Payer: Self-pay

## 2015-02-17 ENCOUNTER — Encounter: Payer: Self-pay | Admitting: Obstetrics and Gynecology

## 2015-02-17 VITALS — BP 134/79 | HR 111 | Ht 62.0 in | Wt 172.5 lb

## 2015-02-17 DIAGNOSIS — I10 Essential (primary) hypertension: Secondary | ICD-10-CM

## 2015-02-17 DIAGNOSIS — R1032 Left lower quadrant pain: Secondary | ICD-10-CM | POA: Diagnosis not present

## 2015-02-17 DIAGNOSIS — N763 Subacute and chronic vulvitis: Secondary | ICD-10-CM

## 2015-02-17 DIAGNOSIS — N762 Acute vulvitis: Secondary | ICD-10-CM

## 2015-02-17 MED ORDER — LEVOTHYROXINE SODIUM 100 MCG PO TABS: 100.0000 ug | ORAL_TABLET | Freq: Every day | ORAL | Status: DC

## 2015-02-17 MED ORDER — LISINOPRIL 10 MG PO TABS: 5.0000 mg | ORAL_TABLET | Freq: Every morning | ORAL | Status: DC

## 2015-02-17 NOTE — Patient Instructions (Signed)
1.  Left lower quadrant abdominal pain is not related to small cyst noted on ultrasound. 2.  Abdominal pain is likely anterior abdominal wall..  Musculoskeletal pain  And possibly related to pelvic adhesions. 3.  Vulvar disorder is severe and needs biopsy.  Return in 1 week for biopsy to rule out Lichen Sclerosis

## 2015-02-18 ENCOUNTER — Telehealth: Payer: Self-pay

## 2015-02-18 DIAGNOSIS — I10 Essential (primary) hypertension: Secondary | ICD-10-CM

## 2015-02-18 MED ORDER — LISINOPRIL 10 MG PO TABS: 5.0000 mg | ORAL_TABLET | Freq: Every morning | ORAL | Status: DC

## 2015-02-18 NOTE — Telephone Encounter (Signed)
Medication has been refilled and sent to Pharmacy °

## 2015-02-20 NOTE — Progress Notes (Signed)
GYN ENCOUNTER NOTE  Subjective:       [name] is a 65 y.o. G74P2002 female is here for gynecologic evaluation of the following issues:  1.Left lower quadrant pain. 2. Adnexal cyst on ultrasound.     The patient is a 65 year old menopausal female, para 2002, status post spontaneous vaginal delivery 2, with long history of left lower quadrant pain starting greater than 1 year ago, presents for evaluation of worsening pain.  Patient reports worsening of symptoms with walking and if she puts pressure on her lower abdomen.  Bowel movements and voiding do not have any impact on her pain.  Lying down.  Seems to help it to improve. Bowel function is normal. Patient has InterStim implant in back for bladder dysfunction.  This was placed by Dr. Davis Gourd years ago.  Patient is status post TAH at age 89. She is status post right oophorectomy in 1969.  Because of a large ovarian cyst. Patient reports having gone through menopause in her 87s.  Review of ultrasound findings is notable for 3 cm left adnexal cyst, simple in appearance with no significant ovarian stroma associated with it.  Gynecologic History No LMP recorded. Patient has had a hysterectomy. Contraception: TAH.  Age 54   Obstetric History OB History  Gravida Para Term Preterm AB SAB TAB Ectopic Multiple Living  2 2 2       2     # Outcome Date GA Lbr Len/2nd Weight Sex Delivery Anes PTL Lv  2 Term 1972   6 lb 6.4 oz (2.903 kg) M Vag-Spont   Y  1 Term 1969   7 lb 3.2 oz (3.266 kg) M Vag-Spont   Y      Past Medical History  Diagnosis Date  . GERD (gastroesophageal reflux disease)   . Hypertension   . Chronic back pain   . Anxiety   . Depression   . Thyroid disease   . Psoriasis     Past Surgical History  Procedure Laterality Date  . Back surgery    . Bladder stimulator    . Hand surgery Right   . Oophorectomy Right     ovarian cyst  . Abdominal hysterectomy      heavy bleeding    Current Outpatient  Prescriptions on File Prior to Visit  Medication Sig Dispense Refill  . ALPRAZolam (XANAX) 0.5 MG tablet Take 1 tablet (0.5 mg total) by mouth 3 (three) times daily as needed for anxiety. 90 tablet 0  . ALPRAZolam (XANAX) 1 MG tablet Take 1 tablet (1 mg total) by mouth at bedtime as needed for anxiety. 30 tablet 0  . amLODipine (NORVASC) 5 MG tablet Take 1 tablet (5 mg total) by mouth daily. 90 tablet 0  . citalopram (CELEXA) 20 MG tablet Take 1 tablet (20 mg total) by mouth daily. 90 tablet 0  . ergocalciferol (VITAMIN D2) 50000 UNITS capsule Take by mouth.    . Vitamin D, Ergocalciferol, (DRISDOL) 50000 units CAPS capsule Take 1 capsule (50,000 Units total) by mouth once a week. For 12 weeks 12 capsule 0   No current facility-administered medications on file prior to visit.    Allergies  Allergen Reactions  . Ciprofloxacin Hives and Diarrhea  . Oxycodone Diarrhea  . Oxycodone-Acetaminophen Diarrhea    Social History   Social History  . Marital Status: Widowed    Spouse Name: N/A  . Number of Children: N/A  . Years of Education: N/A   Occupational History  . Not  on file.   Social History Main Topics  . Smoking status: Current Every Day Smoker -- 1.00 packs/day    Types: Cigarettes  . Smokeless tobacco: Not on file  . Alcohol Use: No  . Drug Use: No  . Sexual Activity: No   Other Topics Concern  . Not on file   Social History Narrative    Family History  Problem Relation Age of Onset  . Heart disease Mother   . COPD Father     The following portions of the patient's history were reviewed and updated as appropriate: allergies, current medications, past family history, past medical history, past social history, past surgical history and problem list.  Review of Systems Review of Systems - General ROS: negative for - chills, fatigue, fever, hot flashes, malaise or night sweats Hematological and Lymphatic ROS: negative for - bleeding problems or swollen lymph  nodes Gastrointestinal ROS: negative for -  blood in stools, change in bowel habits and nausea/vomiting.  POSITIVE-abdominal pain, Musculoskeletal ROS: negative for - joint pain, muscle pain or muscular weakness Genito-Urinary ROS: negative for - change in menstrual cycle, dysmenorrhea, dyspareunia, dysuria, genital discharge, hematuria, incontinence, irregular/heavy menses, nocturia.Marland Kitchen  POSITIVE- genital ulcers, Pelvic pain (perineal discomfort)  Objective:   BP 134/79 mmHg  Pulse 111  Ht 5\' 2"  (1.575 m)  Wt 172 lb 8 oz (78.245 kg)  BMI 31.54 kg/m2 CONSTITUTIONAL: Well-developed, well-nourished female in no acute distress.  HENT:  Normocephalic, atraumatic.  NECK: Normal range of motion, supple, no masses.  Normal thyroid.  SKIN: Skin is warm and dry. No rash noted. Not diaphoretic. No erythema. No pallor. [place]: Alert and oriented to person, place, and time. PSYCHIATRIC: Normal mood and affect. Normal behavior. Normal judgment and thought content. CARDIOVASCULAR:Not Examined RESPIRATORY: Not Examined BREASTS: Not Examined ABDOMEN: Soft, non distended; Non tender.  No Organomegaly. PELVIC:  External Genitalia: Symmetric vulvar lichenification and some erosions at the introitus; extends to the perianal region as well  BUS: Normal  Vagina:Atrophic; no lesions  Cervix:Surgically absent  Uterus: Surgically absent  Adnexa: Nonpalpable; left lower quadrant discomfort is associated with the anterior abdominal wall (not deep in the pelvis)  RV: External exam is notable for the perianal lichenification; rectal reveals no rectal mass.  Bladder: Nontender MUSCULOSKELETAL: Normal range of motion. No tenderness.  No cyanosis, clubbing, or edema.     Assessment:   1. LLQ pain; 3 cm simple left adnexal cyst, not likely related to pelvic pain syndrome.  Clinical exam reveals patient's pain to be localized to the anterior abdominal wall on the left lower quadrant, not in the pelvis.  2.  Vulvitis; Likely consistent with lichen sclerosus     Plan:   1.  Reassurance is given regarding her pelvic pain and simple adnexal cyst.  She understands that the cyst is not likely contributing to her chronic left lower quadrant pelvic pain.  Patient has never had colonoscopy, and she may be a candidate for proceeding with that evaluation process.  2.  Patient is to return in 1 week for vulvar biopsies to rule out lichen sclerosus.  Brayton Mars, MD  Note: This dictation was prepared with Dragon dictation along with smaller phrase technology. Any transcriptional errors that result from this process are unintentional.

## 2015-02-23 DIAGNOSIS — H2513 Age-related nuclear cataract, bilateral: Secondary | ICD-10-CM | POA: Diagnosis not present

## 2015-02-23 DIAGNOSIS — H1859 Other hereditary corneal dystrophies: Secondary | ICD-10-CM | POA: Diagnosis not present

## 2015-02-24 ENCOUNTER — Encounter: Payer: Self-pay | Admitting: Obstetrics and Gynecology

## 2015-02-24 ENCOUNTER — Ambulatory Visit (INDEPENDENT_AMBULATORY_CARE_PROVIDER_SITE_OTHER): Payer: PPO | Admitting: Obstetrics and Gynecology

## 2015-02-24 VITALS — BP 136/71 | HR 99 | Ht 62.0 in | Wt 171.2 lb

## 2015-02-24 DIAGNOSIS — N9089 Other specified noninflammatory disorders of vulva and perineum: Secondary | ICD-10-CM

## 2015-02-24 DIAGNOSIS — N762 Acute vulvitis: Secondary | ICD-10-CM

## 2015-02-24 DIAGNOSIS — L900 Lichen sclerosus et atrophicus: Secondary | ICD-10-CM | POA: Diagnosis not present

## 2015-03-01 NOTE — Progress Notes (Signed)
Chief complaint: 1.  Chronic vulvitis. 2.  Vulvar biopsy.  Patient presents today for punch biopsy of the vulva to rule out lichen sclerosus  OBJECTIVE: BP 136/71 mmHg  Pulse 99  Ht 5\' 2"  (1.575 m)  Wt 171 lb 3.2 oz (77.656 kg)  BMI 31.31 kg/m2 External Genitalia: Symmetric vulvar lichenification and some erosions at the introitus; extends to the perianal region as well BUS: Normal  PROCEDURE:  Punch biopsy-vulva. The labia majora was prepped with Betadine.  One percent lidocaine without epinephrine was infiltrated, 4 cc, for anesthetic.  3.5 mm punch biopsy was taken and sent to pathology.  3-0 Vicryl repeat suture was used, 2 simple interrupted sutures for hemostasis.  Procedure was well tolerated.  ASSESSMENT: 1.  Chronic vulvitis, cannot rule out lichen sclerosus.  PLAN: 1.  3.5 mm punch biopsy. 2.  Local wound care. 3.  Return in 1 week for follow-up and further management planning.  Brayton Mars, MD  Note: This dictation was prepared with Dragon dictation along with smaller phrase technology. Any transcriptional errors that result from this process are unintentional.

## 2015-03-02 ENCOUNTER — Encounter: Payer: Self-pay | Admitting: Family Medicine

## 2015-03-02 ENCOUNTER — Ambulatory Visit (INDEPENDENT_AMBULATORY_CARE_PROVIDER_SITE_OTHER): Payer: PPO | Admitting: Family Medicine

## 2015-03-02 VITALS — BP 139/71 | HR 102 | Temp 98.5°F | Resp 20 | Ht 62.0 in | Wt 171.7 lb

## 2015-03-02 DIAGNOSIS — F410 Panic disorder [episodic paroxysmal anxiety] without agoraphobia: Secondary | ICD-10-CM | POA: Diagnosis not present

## 2015-03-02 DIAGNOSIS — H25043 Posterior subcapsular polar age-related cataract, bilateral: Secondary | ICD-10-CM | POA: Diagnosis not present

## 2015-03-02 LAB — PATHOLOGY

## 2015-03-02 MED ORDER — ALPRAZOLAM 0.5 MG PO TABS: 0.5000 mg | ORAL_TABLET | Freq: Three times a day (TID) | ORAL | Status: DC | PRN

## 2015-03-02 MED ORDER — ALPRAZOLAM 1 MG PO TABS: 1.0000 mg | ORAL_TABLET | Freq: Every evening | ORAL | Status: DC | PRN

## 2015-03-02 NOTE — Progress Notes (Signed)
Name: [name]   MRN: [id number]    DOB: September 26, 1950   Date:03/02/2015       Progress Note  Subjective  Chief Complaint  Chief Complaint  Patient presents with  . Medication Refill    lisinopril 10 mg / synthoid 100 mcg / xanax  . Hypertension    Anxiety Presents for follow-up visit. The problem has been unchanged. Symptoms include depressed mood, excessive worry, insomnia, irritability and nervous/anxious behavior.   Past treatments include benzodiazephines. Compliance with prior treatments has been good.     Past Medical History  Diagnosis Date  . GERD (gastroesophageal reflux disease)   . Hypertension   . Chronic back pain   . Anxiety   . Depression   . Thyroid disease   . Psoriasis     Past Surgical History  Procedure Laterality Date  . Back surgery    . Bladder stimulator    . Hand surgery Right   . Oophorectomy Right     ovarian cyst  . Abdominal hysterectomy      heavy bleeding    Family History  Problem Relation Age of Onset  . Heart disease Mother   . COPD Father     Social History   Social History  . Marital Status: Widowed    Spouse Name: N/A  . Number of Children: N/A  . Years of Education: N/A   Occupational History  . Not on file.   Social History Main Topics  . Smoking status: Current Every Day Smoker -- 1.00 packs/day    Types: Cigarettes  . Smokeless tobacco: Not on file  . Alcohol Use: No  . Drug Use: No  . Sexual Activity: No   Other Topics Concern  . Not on file   Social History Narrative     Current outpatient prescriptions:  .  ALPRAZolam (XANAX) 0.5 MG tablet, Take 1 tablet (0.5 mg total) by mouth 3 (three) times daily as needed for anxiety., Disp: 90 tablet, Rfl: 0 .  ALPRAZolam (XANAX) 1 MG tablet, Take 1 tablet (1 mg total) by mouth at bedtime as needed for anxiety., Disp: 30 tablet, Rfl: 0 .  amLODipine (NORVASC) 5 MG tablet, Take 1 tablet (5 mg total) by mouth daily., Disp: 90 tablet, Rfl: 0 .  citalopram  (CELEXA) 20 MG tablet, Take 1 tablet (20 mg total) by mouth daily., Disp: 90 tablet, Rfl: 0 .  ergocalciferol (VITAMIN D2) 50000 UNITS capsule, Take by mouth., Disp: , Rfl:  .  levothyroxine (SYNTHROID, LEVOTHROID) 100 MCG tablet, Take 1 tablet (100 mcg total) by mouth daily before breakfast., Disp: 30 tablet, Rfl: 1 .  lisinopril (PRINIVIL,ZESTRIL) 10 MG tablet, Take 0.5 tablets (5 mg total) by mouth every morning. Take 5 mg every morning., Disp: 90 tablet, Rfl: 0 .  Vitamin D, Ergocalciferol, (DRISDOL) 50000 units CAPS capsule, Take 1 capsule (50,000 Units total) by mouth once a week. For 12 weeks, Disp: 12 capsule, Rfl: 0  Allergies  Allergen Reactions  . Ciprofloxacin Hives and Diarrhea  . Oxycodone Diarrhea  . Oxycodone-Acetaminophen Diarrhea     Review of Systems  Constitutional: Positive for irritability.  Psychiatric/Behavioral: The patient is nervous/anxious and has insomnia.     Objective  Filed Vitals:   03/02/15 1136  BP: 139/71  Pulse: 102  Temp: 98.5 F (36.9 C)  TempSrc: Oral  Resp: 20  Height: 5\' 2"  (1.575 m)  Weight: 171 lb 11.2 oz (77.883 kg)  SpO2: 93%    Physical Exam  Constitutional: She is oriented to person, place, and time and well-developed, well-nourished, and in no distress.  Cardiovascular: Normal rate and regular rhythm.   Pulmonary/Chest: Effort normal and breath sounds normal.  Neurological: She is alert and oriented to person, place, and time.  Psychiatric: Her mood appears anxious.  Nursing note and vitals reviewed.    Assessment & Plan  1. Panic disorder without agoraphobia Symptoms responsive to alprazolam. Refills provided and follow-up in one month. - ALPRAZolam (XANAX) 0.5 MG tablet; Take 1 tablet (0.5 mg total) by mouth 3 (three) times daily as needed for anxiety.  Dispense: 90 tablet; Refill: 0 - ALPRAZolam (XANAX) 1 MG tablet; Take 1 tablet (1 mg total) by mouth at bedtime as needed for anxiety.  Dispense: 30 tablet; Refill:  0   Areanna Gengler [facility] 03/02/2015 12:12 PM

## 2015-03-03 ENCOUNTER — Encounter: Payer: Self-pay | Admitting: *Deleted

## 2015-03-03 ENCOUNTER — Ambulatory Visit (INDEPENDENT_AMBULATORY_CARE_PROVIDER_SITE_OTHER): Payer: PPO | Admitting: Obstetrics and Gynecology

## 2015-03-03 VITALS — BP 126/73 | HR 99 | Ht 62.0 in | Wt 172.9 lb

## 2015-03-03 DIAGNOSIS — L900 Lichen sclerosus et atrophicus: Secondary | ICD-10-CM

## 2015-03-03 DIAGNOSIS — N762 Acute vulvitis: Secondary | ICD-10-CM

## 2015-03-03 MED ORDER — CLOBETASOL PROPIONATE 0.05 % EX OINT: 1.0000 "application " | TOPICAL_OINTMENT | Freq: Every day | CUTANEOUS | Status: DC

## 2015-03-03 NOTE — Progress Notes (Signed)
Chief complaint: 1.  One week follow-up on vulvar biopsy. 2.  Chronic vulvitis.  Patient has done well since her punch biopsy.  One week ago.  She is not experiencing any significant pain, bleeding, or discharge.  Pathology: Lichen sclerosus.  Past medical history, past surgical history, problems, medications, and allergies are reviewed.  OBJECTIVE: BP 126/73 mmHg  Pulse 99  Ht 5\' 2"  (1.575 m)  Wt 172 lb 14.4 oz (78.427 kg)  BMI 31.62 kg/m2 Pleasant elderly female in no acute distress. Abdomen: Soft, nontender. Pelvic exam: External Genitalia: Symmetric vulvar lichenification and some erosions at the introitus; extends to the perianal region as well.  Right labia majora biopsy site is healing; residual suture present. BUS: Normal Vagina:Atrophic; no lesions  ASSESSMENT: 1.  Vulvar lichen sclerosis.  PLAN: 1.  Temovate ointment 0.05%-applied twice a day for 2 weeks followed by daily application for 4 weeks. 2.  Return in 6 weeks for follow-up. 3.  Consider blow drying Perineum After bathing  Brayton Mars, MD  Note: This dictation was prepared with Dragon dictation along with smaller phrase technology. Any transcriptional errors that result from this process are unintentional.

## 2015-03-03 NOTE — Patient Instructions (Signed)
1.  Clobetasol 0.05%  (Temovate) Ointment is to be applied to the vulva and perianal region twice a day for 2 weeks; then daily 4 weeks. 2.  Return in 6 weeks for follow-up. 3.  Avoid excessive rubbing after bathing.  Consider hairdryer to blow dry your perineum.

## 2015-03-07 ENCOUNTER — Ambulatory Visit: Payer: PPO | Admitting: Anesthesiology

## 2015-03-07 ENCOUNTER — Encounter
Admission: RE | Disposition: A | Discharge status: Home / Self Care | Payer: Self-pay | Source: Ambulatory Visit | Attending: Ophthalmology

## 2015-03-07 ENCOUNTER — Ambulatory Visit
Admission: RE | Admit: 2015-03-07 | Discharge: 2015-03-07 | Disposition: A | Discharge status: Home / Self Care | Payer: PPO | Source: Ambulatory Visit | Attending: Ophthalmology | Admitting: Ophthalmology

## 2015-03-07 ENCOUNTER — Encounter: Payer: Self-pay | Admitting: *Deleted

## 2015-03-07 DIAGNOSIS — M1991 Primary osteoarthritis, unspecified site: Secondary | ICD-10-CM | POA: Insufficient documentation

## 2015-03-07 DIAGNOSIS — Z79899 Other long term (current) drug therapy: Secondary | ICD-10-CM | POA: Insufficient documentation

## 2015-03-07 DIAGNOSIS — F419 Anxiety disorder, unspecified: Secondary | ICD-10-CM | POA: Diagnosis not present

## 2015-03-07 DIAGNOSIS — Z90710 Acquired absence of both cervix and uterus: Secondary | ICD-10-CM | POA: Diagnosis not present

## 2015-03-07 DIAGNOSIS — H25011 Cortical age-related cataract, right eye: Secondary | ICD-10-CM | POA: Insufficient documentation

## 2015-03-07 DIAGNOSIS — Z87891 Personal history of nicotine dependence: Secondary | ICD-10-CM | POA: Diagnosis not present

## 2015-03-07 DIAGNOSIS — H2511 Age-related nuclear cataract, right eye: Secondary | ICD-10-CM | POA: Diagnosis not present

## 2015-03-07 DIAGNOSIS — I10 Essential (primary) hypertension: Secondary | ICD-10-CM | POA: Diagnosis not present

## 2015-03-07 DIAGNOSIS — Z9889 Other specified postprocedural states: Secondary | ICD-10-CM | POA: Insufficient documentation

## 2015-03-07 DIAGNOSIS — K219 Gastro-esophageal reflux disease without esophagitis: Secondary | ICD-10-CM | POA: Diagnosis not present

## 2015-03-07 DIAGNOSIS — H269 Unspecified cataract: Secondary | ICD-10-CM | POA: Diagnosis not present

## 2015-03-07 DIAGNOSIS — L409 Psoriasis, unspecified: Secondary | ICD-10-CM | POA: Insufficient documentation

## 2015-03-07 DIAGNOSIS — H25043 Posterior subcapsular polar age-related cataract, bilateral: Secondary | ICD-10-CM | POA: Diagnosis not present

## 2015-03-07 HISTORY — PX: CATARACT EXTRACTION W/PHACO: SHX586

## 2015-03-07 HISTORY — DX: Pneumonia, unspecified organism: J18.9

## 2015-03-07 SURGERY — PHACOEMULSIFICATION, CATARACT, WITH IOL INSERTION
Anesthesia: General | Site: Eye | Laterality: Right | Wound class: Clean

## 2015-03-07 MED ORDER — CYCLOPENTOLATE HCL 2 % OP SOLN
OPHTHALMIC | Status: AC
  Filled 2015-03-07: qty 2

## 2015-03-07 MED ORDER — PHENYLEPHRINE HCL 10 % OP SOLN
OPHTHALMIC | Status: AC
  Filled 2015-03-07: qty 5

## 2015-03-07 MED ORDER — BUPIVACAINE HCL (PF) 0.75 % IJ SOLN
INTRAMUSCULAR | Status: AC
  Filled 2015-03-07: qty 10

## 2015-03-07 MED ORDER — BSS IO SOLN
INTRAOCULAR | Status: DC | PRN
  Administered 2015-03-07: 1 mL via OPHTHALMIC

## 2015-03-07 MED ORDER — EPINEPHRINE HCL 1 MG/ML IJ SOLN
INTRAMUSCULAR | Status: AC
  Filled 2015-03-07: qty 2

## 2015-03-07 MED ORDER — ALFENTANIL 500 MCG/ML IJ INJ
INJECTION | INTRAMUSCULAR | Status: DC | PRN
  Administered 2015-03-07: 500 ug via INTRAVENOUS

## 2015-03-07 MED ORDER — LIDOCAINE HCL (PF) 4 % IJ SOLN
INTRAMUSCULAR | Status: AC
  Filled 2015-03-07: qty 10

## 2015-03-07 MED ORDER — CARBACHOL 0.01 % IO SOLN
INTRAOCULAR | Status: DC | PRN
  Administered 2015-03-07: .5 mL via INTRAOCULAR

## 2015-03-07 MED ORDER — TETRACAINE HCL 0.5 % OP SOLN
OPHTHALMIC | Status: DC | PRN
Start: 2015-03-07 — End: 2015-03-07
  Administered 2015-03-07: 1 [drp] via OPHTHALMIC

## 2015-03-07 MED ORDER — CEFUROXIME OPHTHALMIC INJECTION 1 MG/0.1 ML
INJECTION | OPHTHALMIC | Status: DC | PRN
  Administered 2015-03-07: .1 mL via INTRACAMERAL

## 2015-03-07 MED ORDER — MOXIFLOXACIN HCL 0.5 % OP SOLN
1.0000 [drp] | OPHTHALMIC | Status: AC | PRN
  Administered 2015-03-07 (×3): 1 [drp] via OPHTHALMIC

## 2015-03-07 MED ORDER — NA CHONDROIT SULF-NA HYALURON 40-17 MG/ML IO SOLN
INTRAOCULAR | Status: DC | PRN
  Administered 2015-03-07: 1 mL via INTRAOCULAR

## 2015-03-07 MED ORDER — SODIUM CHLORIDE 0.9 % IV SOLN
INTRAVENOUS | Status: DC
  Administered 2015-03-07: 07:00:00 via INTRAVENOUS

## 2015-03-07 MED ORDER — NA CHONDROIT SULF-NA HYALURON 40-17 MG/ML IO SOLN
INTRAOCULAR | Status: AC
  Filled 2015-03-07: qty 1

## 2015-03-07 MED ORDER — CEFUROXIME OPHTHALMIC INJECTION 1 MG/0.1 ML
INJECTION | OPHTHALMIC | Status: AC
  Filled 2015-03-07: qty 0.1

## 2015-03-07 MED ORDER — PHENYLEPHRINE HCL 10 % OP SOLN
1.0000 [drp] | OPHTHALMIC | Status: AC | PRN
  Administered 2015-03-07 (×4): 1 [drp] via OPHTHALMIC

## 2015-03-07 MED ORDER — LIDOCAINE HCL (PF) 4 % IJ SOLN
INTRAMUSCULAR | Status: DC | PRN
  Administered 2015-03-07: 4 mL via OPHTHALMIC

## 2015-03-07 MED ORDER — HYALURONIDASE HUMAN 150 UNIT/ML IJ SOLN
INTRAMUSCULAR | Status: AC
  Filled 2015-03-07: qty 1

## 2015-03-07 MED ORDER — LIDOCAINE HCL (PF) 4 % IJ SOLN
INTRAOCULAR | Status: DC | PRN
  Administered 2015-03-07: .5 mL via OPHTHALMIC

## 2015-03-07 MED ORDER — MOXIFLOXACIN HCL 0.5 % OP SOLN
OPHTHALMIC | Status: DC | PRN
  Administered 2015-03-07: 1 [drp] via OPHTHALMIC

## 2015-03-07 MED ORDER — CYCLOPENTOLATE HCL 2 % OP SOLN
1.0000 [drp] | OPHTHALMIC | Status: DC | PRN
  Administered 2015-03-07 (×3): 1 [drp] via OPHTHALMIC

## 2015-03-07 MED ORDER — TETRACAINE HCL 0.5 % OP SOLN
OPHTHALMIC | Status: AC
  Filled 2015-03-07: qty 2

## 2015-03-07 MED ORDER — MOXIFLOXACIN HCL 0.5 % OP SOLN
OPHTHALMIC | Status: AC
  Filled 2015-03-07: qty 3

## 2015-03-07 SURGICAL SUPPLY — 29 items

## 2015-03-07 NOTE — Op Note (Signed)
Date of Surgery: 03/07/2015 Date of Dictation: 03/07/2015 8:46 AM Pre-operative Diagnosis:  Nuclear Sclerotic Cataract and Cortical Cataract right Eye Post-operative Diagnosis: same Procedure performed: Extra-capsular Cataract Extraction (ECCE) with placement of a posterior chamber intraocular lens (IOL) right Eye IOL:  Implant Name Type Inv. Item Serial No. Manufacturer Lot No. LRB No. Used  LENS IOL ACRYSOF IQ 22.0 - [id number] Intraocular Lens LENS IOL ACRYSOF IQ 22.0 [id number] ALCON   Right 1   Anesthesia: 2% Lidocaine and 4% Marcaine in a 50/50 mixture with 10 unites/ml of Hylenex given as a peribulbar Anesthesiologist: Anesthesiologist: Molli Barrows, MD CRNA: Demetrius Charity, CRNA; Jonna Clark, CRNA Complications: none Estimated Blood Loss: less than 1 ml  Description of procedure:  The patient was given anesthesia and sedation via intravenous access. The patient was then prepped and draped in the usual fashion. A 25-gauge needle was bent for initiating the capsulorhexis. A 5-0 silk suture was placed through the conjunctiva superior and inferiorly to serve as bridle sutures. Hemostasis was obtained at the superior limbus using an eraser cautery. A partial thickness groove was made at the anterior surgical limbus with a 64 Beaver blade and this was dissected anteriorly with an Avaya. The anterior chamber was entered at 10 o'clock with a 1.0 mm paracentesis knife and through the lamellar dissection with a 2.6 mm Alcon keratome. Epi-Shugarcaine 0.5 CC [9 cc BSS Plus (Alcon), 3 cc 4% preservative-free lidocaine (Hospira) and 4 cc 1:1000 preservative-free, bisulfite-free epinephrine] was injected into the anterior chamber via the paracentesis tract. Epi-Shugarcaine 0.5 CC [9 cc BSS Plus (Alcon), 3 cc 4% preservative-free lidocaine (Hospira) and 4 cc 1:1000 preservative-free, bisulfite-free epinephrine] was injected into the anterior chamber via the paracentesis tract.  DiscoVisc was injected to replace the aqueous and a continuous tear curvilinear capsulorhexis was performed using a bent 25-gauge needle.  Balance salt on a syringe was used to perform hydro-dissection and phacoemulsification was carried out using a divide and conquer technique. Procedure(s) with comments: CATARACT EXTRACTION PHACO AND INTRAOCULAR LENS PLACEMENT ([facility]) (Right) - [name] 01:14 AP% 26.4 CDE 33.04 fluid pack lot # [id number]. Irrigation/aspiration was used to remove the residual cortex and the capsular bag was inflated with DiscoVisc. The intraocular lens was inserted into the capsular bag using a pre-loaded UltraSert Delivery System. Irrigation/aspiration was used to remove the residual DiscoVisc. The wound was inflated with balanced salt and checked for leaks. None were found. Miostat was injected via the paracentesis track and 0.1 ml of cefuroxime containing 1 mg of drug  was injected via the paracentesis track. The wound was checked for leaks again and none were found.   The bridal sutures were removed and two drops of Vigamox were placed on the eye. An eye shield was placed to protect the eye and the patient was discharged to the recovery area in good condition.   Shayli Altemose MD

## 2015-03-07 NOTE — H&P (Signed)
See scanned note.

## 2015-03-07 NOTE — Progress Notes (Signed)
IV RIGHT HAND - p ox right mid finger To OR with ecg leads, shoe covers/surgi hat in place vigamox and oxi tubing sent to OR with patient

## 2015-03-07 NOTE — Interval H&P Note (Signed)
History and Physical Interval Note:  03/07/2015 7:24 AM  [name]  has presented today for surgery, with the diagnosis of CATARACT  The various methods of treatment have been discussed with the patient and family. After consideration of risks, benefits and other options for treatment, the patient has consented to  Procedure(s): CATARACT EXTRACTION PHACO AND INTRAOCULAR LENS PLACEMENT ([place]) (Right) as a surgical intervention .  The patient's history has been reviewed, patient examined, no change in status, stable for surgery.  I have reviewed the patient's chart and labs.  Questions were answered to the patient's satisfaction.     Bellamia Ferch

## 2015-03-07 NOTE — Discharge Instructions (Addendum)
See handout. Eye Surgery Discharge Instructions  Expect mild scratchy sensation or mild soreness. DO NOT RUB YOUR EYE!  The day of surgery:  Minimal physical activity, but bed rest is not required  No reading, computer work, or close hand work  No bending, lifting, or straining.  May watch TV  For 24 hours:  No driving, legal decisions, or alcoholic beverages  Safety precautions  Eat anything you prefer: It is better to start with liquids, then soup then solid foods.  _____ Eye patch should be worn until postoperative exam tomorrow.  ____ Solar shield eyeglasses should be worn for comfort in the sunlight/patch while sleeping  Resume all regular medications including aspirin or Coumadin if these were discontinued prior to surgery. You may shower, bathe, shave, or wash your hair. Tylenol may be taken for mild discomfort.  Call your doctor if you experience significant pain, nausea, or vomiting, fever > 101 or other signs of infection. [phone] or [phone] Specific instructions:  Follow-up Information    Follow up with Estill Cotta, MD. Go on 03/08/2015.   Specialty:  Ophthalmology   Why:  Appointment time is set for 9:15 am TOMORROW, For wound re-check   Contact information:   [place] [address] [phone]     AMBULATORY SURGERY  DISCHARGE INSTRUCTIONS   1) The drugs that you were given will stay in your system until tomorrow so for the next 24 hours you should not:  A) Drive an automobile B) Make any legal decisions C) Drink any alcoholic beverage   2) You may resume regular meals tomorrow.  Today it is better to start with liquids and gradually work up to solid foods.  You may eat anything you prefer, but it is better to start with liquids, then soup and crackers, and gradually work up to solid foods.   3) Please notify your doctor immediately if you have any unusual bleeding, trouble breathing, redness and pain at the  surgery site, drainage, fever, or pain not relieved by medication.    4) Additional Instructions:        Please contact your physician with any problems or Same Day Surgery at [phone], Monday through Friday 6 am to 4 pm, or [place] at [facility] number at [phone].

## 2015-03-07 NOTE — Anesthesia Preprocedure Evaluation (Signed)
Anesthesia Evaluation  Patient identified by MRN, date of birth, ID band Patient awake    Reviewed: Allergy & Precautions, H&P , NPO status , Patient's Chart, lab work & pertinent test results, reviewed documented beta blocker date and time   Airway Mallampati: II   Neck ROM: full    Dental  (+) Poor Dentition   Pulmonary neg pulmonary ROS, pneumonia, Current Smoker,    Pulmonary exam normal        Cardiovascular hypertension, negative cardio ROS Normal cardiovascular exam     Neuro/Psych PSYCHIATRIC DISORDERS negative neurological ROS  negative psych ROS   GI/Hepatic negative GI ROS, Neg liver ROS, GERD  ,  Endo/Other  negative endocrine ROSHypothyroidism   Renal/GU negative Renal ROS  negative genitourinary   Musculoskeletal   Abdominal   Peds  Hematology negative hematology ROS (+)   Anesthesia Other Findings Past Medical History:   GERD (gastroesophageal reflux disease)                       Hypertension                                                 Chronic back pain                                            Anxiety                                                      Depression                                                   Thyroid disease                                              Psoriasis                                                    Pneumonia                                                      Comment:RECENT X 3 Past Surgical History:   BACK SURGERY                                                  bladder stimulator  HAND SURGERY                                    Right              OOPHORECTOMY                                    Right                Comment:ovarian cyst   ABDOMINAL HYSTERECTOMY                                          Comment:heavy bleeding BMI    Body Mass Index   31.45 kg/m 2     Reproductive/Obstetrics                              Anesthesia Physical Anesthesia Plan  ASA: III  Anesthesia Plan: General   Post-op Pain Management:    Induction:   Airway Management Planned:   Additional Equipment:   Intra-op Plan:   Post-operative Plan:   Informed Consent: I have reviewed the patients History and Physical, chart, labs and discussed the procedure including the risks, benefits and alternatives for the proposed anesthesia with the patient or authorized representative who has indicated his/her understanding and acceptance.   Dental Advisory Given  Plan Discussed with: CRNA  Anesthesia Plan Comments:         Anesthesia Quick Evaluation

## 2015-03-07 NOTE — Transfer of Care (Signed)
Immediate Anesthesia Transfer of Care Note  Patient: [name]  Procedure(s) Performed: Procedure(s) with comments: CATARACT EXTRACTION PHACO AND INTRAOCULAR LENS PLACEMENT ([facility]) (Right) - [name] 01:14 AP% 26.4 CDE 33.04 fluid pack lot # [id number]  Patient Location: PACU and Short Stay  Anesthesia Type:MAC  Level of Consciousness: awake, alert  and oriented  Airway & Oxygen Therapy: Patient Spontanous Breathing  Post-op Assessment: Report given to RN and Post -op Vital signs reviewed and stable  Post vital signs: Reviewed and stable  Last Vitals:  Filed Vitals:   03/07/15 0639 03/07/15 0849  BP: 149/76 162/99  Pulse: 83   Temp: 35.9 C 36.2 C  Resp: 18 20    Complications: No apparent anesthesia complications

## 2015-03-07 NOTE — Anesthesia Postprocedure Evaluation (Signed)
Anesthesia Post Note  Patient: [name]  Procedure(s) Performed: Procedure(s) (LRB): CATARACT EXTRACTION PHACO AND INTRAOCULAR LENS PLACEMENT ([facility]) (Right)  Patient location during evaluation: Short Stay Anesthesia Type: MAC Level of consciousness: awake and awake and alert Pain management: pain level controlled Vital Signs Assessment: post-procedure vital signs reviewed and stable Respiratory status: spontaneous breathing Cardiovascular status: stable Anesthetic complications: no    Last Vitals:  Filed Vitals:   03/07/15 0849 03/07/15 0851  BP: 162/99 120/60  Pulse:    Temp: 36.2 C   Resp: 20     Last Pain:  Filed Vitals:   03/07/15 0851  PainSc: 6                  Lanora Manis

## 2015-03-16 DIAGNOSIS — H2512 Age-related nuclear cataract, left eye: Secondary | ICD-10-CM | POA: Diagnosis not present

## 2015-03-17 NOTE — Pre-Procedure Instructions (Signed)
Spoke to [place] at Dr. Lewayne Bunting office regarding pt's allergy to Cipro and Vigamox order, ok to proceed with Vigamox.

## 2015-03-20 NOTE — H&P (Signed)
See scanned note.

## 2015-03-21 ENCOUNTER — Ambulatory Visit: Payer: PPO | Admitting: *Deleted

## 2015-03-21 ENCOUNTER — Encounter: Payer: Self-pay | Admitting: *Deleted

## 2015-03-21 ENCOUNTER — Ambulatory Visit
Admission: RE | Admit: 2015-03-21 | Discharge: 2015-03-21 | Disposition: A | Discharge status: Home / Self Care | Payer: PPO | Source: Ambulatory Visit | Attending: Ophthalmology | Admitting: Ophthalmology

## 2015-03-21 ENCOUNTER — Encounter
Admission: RE | Disposition: A | Discharge status: Home / Self Care | Payer: Self-pay | Source: Ambulatory Visit | Attending: Ophthalmology

## 2015-03-21 DIAGNOSIS — F419 Anxiety disorder, unspecified: Secondary | ICD-10-CM | POA: Diagnosis not present

## 2015-03-21 DIAGNOSIS — I10 Essential (primary) hypertension: Secondary | ICD-10-CM | POA: Insufficient documentation

## 2015-03-21 DIAGNOSIS — Z79899 Other long term (current) drug therapy: Secondary | ICD-10-CM | POA: Insufficient documentation

## 2015-03-21 DIAGNOSIS — Z9841 Cataract extraction status, right eye: Secondary | ICD-10-CM | POA: Insufficient documentation

## 2015-03-21 DIAGNOSIS — Z90710 Acquired absence of both cervix and uterus: Secondary | ICD-10-CM | POA: Diagnosis not present

## 2015-03-21 DIAGNOSIS — H2512 Age-related nuclear cataract, left eye: Secondary | ICD-10-CM | POA: Diagnosis not present

## 2015-03-21 DIAGNOSIS — L409 Psoriasis, unspecified: Secondary | ICD-10-CM | POA: Insufficient documentation

## 2015-03-21 DIAGNOSIS — H25012 Cortical age-related cataract, left eye: Secondary | ICD-10-CM | POA: Diagnosis not present

## 2015-03-21 DIAGNOSIS — Z87891 Personal history of nicotine dependence: Secondary | ICD-10-CM | POA: Insufficient documentation

## 2015-03-21 DIAGNOSIS — M1991 Primary osteoarthritis, unspecified site: Secondary | ICD-10-CM | POA: Diagnosis not present

## 2015-03-21 DIAGNOSIS — Z9889 Other specified postprocedural states: Secondary | ICD-10-CM | POA: Diagnosis not present

## 2015-03-21 DIAGNOSIS — H269 Unspecified cataract: Secondary | ICD-10-CM | POA: Diagnosis not present

## 2015-03-21 HISTORY — PX: CATARACT EXTRACTION W/PHACO: SHX586

## 2015-03-21 HISTORY — DX: Unspecified osteoarthritis, unspecified site: M19.90

## 2015-03-21 SURGERY — PHACOEMULSIFICATION, CATARACT, WITH IOL INSERTION
Anesthesia: Monitor Anesthesia Care | Site: Eye | Laterality: Left | Wound class: Clean

## 2015-03-21 MED ORDER — LIDOCAINE HCL (PF) 4 % IJ SOLN
INTRAMUSCULAR | Status: DC | PRN
  Administered 2015-03-21: 4 mL via OPHTHALMIC

## 2015-03-21 MED ORDER — NA CHONDROIT SULF-NA HYALURON 40-17 MG/ML IO SOLN
INTRAOCULAR | Status: DC | PRN
  Administered 2015-03-21: 1 mL via INTRAOCULAR

## 2015-03-21 MED ORDER — MOXIFLOXACIN HCL 0.5 % OP SOLN
OPHTHALMIC | Status: AC
  Administered 2015-03-21: 1 [drp] via OPHTHALMIC
  Filled 2015-03-21: qty 3

## 2015-03-21 MED ORDER — TETRACAINE HCL 0.5 % OP SOLN
OPHTHALMIC | Status: DC | PRN
  Administered 2015-03-21: 1 [drp] via OPHTHALMIC

## 2015-03-21 MED ORDER — EPINEPHRINE HCL 1 MG/ML IJ SOLN
INTRAMUSCULAR | Status: AC
  Filled 2015-03-21: qty 2

## 2015-03-21 MED ORDER — LIDOCAINE HCL (PF) 4 % IJ SOLN
INTRAMUSCULAR | Status: AC
  Filled 2015-03-21: qty 5

## 2015-03-21 MED ORDER — MOXIFLOXACIN HCL 0.5 % OP SOLN
OPHTHALMIC | Status: DC | PRN
  Administered 2015-03-21: 1 [drp] via OPHTHALMIC

## 2015-03-21 MED ORDER — HYALURONIDASE HUMAN 150 UNIT/ML IJ SOLN
INTRAMUSCULAR | Status: AC
  Filled 2015-03-21: qty 1

## 2015-03-21 MED ORDER — TETRACAINE HCL 0.5 % OP SOLN
OPHTHALMIC | Status: AC
  Filled 2015-03-21: qty 2

## 2015-03-21 MED ORDER — NA CHONDROIT SULF-NA HYALURON 40-17 MG/ML IO SOLN
INTRAOCULAR | Status: AC
  Filled 2015-03-21: qty 1

## 2015-03-21 MED ORDER — CARBACHOL 0.01 % IO SOLN
INTRAOCULAR | Status: DC | PRN
  Administered 2015-03-21: .5 mL via INTRAOCULAR

## 2015-03-21 MED ORDER — CYCLOPENTOLATE HCL 2 % OP SOLN
OPHTHALMIC | Status: AC
  Administered 2015-03-21: 1 [drp] via OPHTHALMIC
  Filled 2015-03-21: qty 2

## 2015-03-21 MED ORDER — BUPIVACAINE HCL (PF) 0.75 % IJ SOLN
INTRAMUSCULAR | Status: AC
  Filled 2015-03-21: qty 10

## 2015-03-21 MED ORDER — PHENYLEPHRINE HCL 10 % OP SOLN
1.0000 [drp] | OPHTHALMIC | Status: AC | PRN
  Administered 2015-03-21 (×4): 1 [drp] via OPHTHALMIC

## 2015-03-21 MED ORDER — CEFUROXIME OPHTHALMIC INJECTION 1 MG/0.1 ML
INJECTION | OPHTHALMIC | Status: DC | PRN
  Administered 2015-03-21: .1 mL via INTRACAMERAL

## 2015-03-21 MED ORDER — SODIUM CHLORIDE 0.9 % IV SOLN
INTRAVENOUS | Status: DC
  Administered 2015-03-21: 07:00:00 via INTRAVENOUS

## 2015-03-21 MED ORDER — CYCLOPENTOLATE HCL 2 % OP SOLN
1.0000 [drp] | OPHTHALMIC | Status: AC | PRN
  Administered 2015-03-21 (×4): 1 [drp] via OPHTHALMIC

## 2015-03-21 MED ORDER — PHENYLEPHRINE HCL 10 % OP SOLN
OPHTHALMIC | Status: AC
  Administered 2015-03-21: 1 [drp] via OPHTHALMIC
  Filled 2015-03-21: qty 5

## 2015-03-21 MED ORDER — EPINEPHRINE HCL 1 MG/ML IJ SOLN
INTRAOCULAR | Status: DC | PRN
  Administered 2015-03-21: 1 mL via OPHTHALMIC

## 2015-03-21 MED ORDER — CEFUROXIME OPHTHALMIC INJECTION 1 MG/0.1 ML
INJECTION | OPHTHALMIC | Status: AC
  Filled 2015-03-21: qty 0.1

## 2015-03-21 MED ORDER — LIDOCAINE HCL (PF) 4 % IJ SOLN
INTRAOCULAR | Status: DC | PRN
  Administered 2015-03-21: .5 mL via OPHTHALMIC

## 2015-03-21 MED ORDER — ALFENTANIL 500 MCG/ML IJ INJ
INJECTION | INTRAMUSCULAR | Status: DC | PRN
Start: 2015-03-21 — End: 2015-03-21
  Administered 2015-03-21: 500 ug via INTRAVENOUS

## 2015-03-21 MED ORDER — MOXIFLOXACIN HCL 0.5 % OP SOLN
1.0000 [drp] | OPHTHALMIC | Status: AC | PRN
  Administered 2015-03-21 (×3): 1 [drp] via OPHTHALMIC

## 2015-03-21 SURGICAL SUPPLY — 29 items

## 2015-03-21 NOTE — Anesthesia Preprocedure Evaluation (Signed)
Anesthesia Evaluation  Patient identified by MRN, date of birth, ID band Patient awake    Reviewed: Allergy & Precautions, NPO status , Patient's Chart, lab work & pertinent test results  Airway Mallampati: III  TM Distance: >3 FB Neck ROM: Limited    Dental  (+) Upper Dentures, Lower Dentures   Pulmonary Current Smoker,    Pulmonary exam normal        Cardiovascular Exercise Tolerance: Poor hypertension, Pt. on medications Normal cardiovascular exam     Neuro/Psych Anxiety Depression    GI/Hepatic GERD  Controlled,  Endo/Other  Hypothyroidism Treated.  Renal/GU      Musculoskeletal  (+) Arthritis , Osteoarthritis,    Abdominal (+) + obese,  Abdomen: soft.    Peds  Hematology   Anesthesia Other Findings   Reproductive/Obstetrics                             Anesthesia Physical Anesthesia Plan  ASA: II  Anesthesia Plan: MAC   Post-op Pain Management:    Induction: Intravenous  Airway Management Planned: Nasal Cannula  Additional Equipment:   Intra-op Plan:   Post-operative Plan:   Informed Consent: I have reviewed the patients History and Physical, chart, labs and discussed the procedure including the risks, benefits and alternatives for the proposed anesthesia with the patient or authorized representative who has indicated his/her understanding and acceptance.     Plan Discussed with: CRNA  Anesthesia Plan Comments:         Anesthesia Quick Evaluation

## 2015-03-21 NOTE — Op Note (Signed)
Date of Surgery: 03/21/2015 Date of Dictation: 03/21/2015 8:44 AM Pre-operative Diagnosis:  Nuclear Sclerotic Cataract and Cortical Cataract left Eye Post-operative Diagnosis: same Procedure performed: Extra-capsular Cataract Extraction (ECCE) with placement of a posterior chamber intraocular lens (IOL) left Eye IOL:  Implant Name Type Inv. Item Serial No. Manufacturer Lot No. LRB No. Used  LENS IOL ACRYSOF IQ 22.0 - [id number] Intraocular Lens LENS IOL ACRYSOF IQ 22.0 [id number] ALCON   Left 1   Anesthesia: 2% Lidocaine and 4% Marcaine in a 50/50 mixture with 10 unites/ml of Hylenex given as a peribulbar Anesthesiologist: Anesthesiologist: Elyse Hsu, MD CRNA: Jonna Clark, CRNA; Silvana Newness, CRNA Complications: none Estimated Blood Loss: less than 1 ml  Description of procedure:  The patient was given anesthesia and sedation via intravenous access. The patient was then prepped and draped in the usual fashion. A 25-gauge needle was bent for initiating the capsulorhexis. A 5-0 silk suture was placed through the conjunctiva superior and inferiorly to serve as bridle sutures. Hemostasis was obtained at the superior limbus using an eraser cautery. A partial thickness groove was made at the anterior surgical limbus with a 64 Beaver blade and this was dissected anteriorly with an Avaya. The anterior chamber was entered at 10 o'clock with a 1.0 mm paracentesis knife and through the lamellar dissection with a 2.6 mm Alcon keratome. Epi-Shugarcaine 0.5 CC [9 cc BSS Plus (Alcon), 3 cc 4% preservative-free lidocaine (Hospira) and 4 cc 1:1000 preservative-free, bisulfite-free epinephrine] was injected into the anterior chamber via the paracentesis tract. Epi-Shugarcaine 0.5 CC [9 cc BSS Plus (Alcon), 3 cc 4% preservative-free lidocaine (Hospira) and 4 cc 1:1000 preservative-free, bisulfite-free epinephrine] was injected into the anterior chamber via the paracentesis tract. DiscoVisc was  injected to replace the aqueous and a continuous tear curvilinear capsulorhexis was performed using a bent 25-gauge needle.  Balance salt on a syringe was used to perform hydro-dissection and phacoemulsification was carried out using a divide and conquer technique. Procedure(s) with comments: CATARACT EXTRACTION PHACO AND INTRAOCULAR LENS PLACEMENT ([facility]) (Left) - [name] 01:21 AP% 25.6 CDE 39.78 fluid pack lot # [id number]. Irrigation/aspiration was used to remove the residual cortex and the capsular bag was inflated with DiscoVisc. The intraocular lens was inserted into the capsular bag using a pre-loaded UltraSert Delivery System. Irrigation/aspiration was used to remove the residual DiscoVisc. The wound was inflated with balanced salt and checked for leaks. None were found. Miostat was injected via the paracentesis track and 0.1 ml of cefuroxime containing 1 mg of drug  was injected via the paracentesis track. The wound was checked for leaks again and none were found.   The bridal sutures were removed and two drops of Vigamox were placed on the eye. An eye shield was placed to protect the eye and the patient was discharged to the recovery area in good condition.   Marva Hendryx MD

## 2015-03-21 NOTE — Interval H&P Note (Signed)
History and Physical Interval Note:  03/21/2015 7:18 AM  [name]  has presented today for surgery, with the diagnosis of CATARACT  The various methods of treatment have been discussed with the patient and family. After consideration of risks, benefits and other options for treatment, the patient has consented to  Procedure(s): CATARACT EXTRACTION PHACO AND INTRAOCULAR LENS PLACEMENT ([place]) (Left) as a surgical intervention .  The patient's history has been reviewed, patient examined, no change in status, stable for surgery.  I have reviewed the patient's chart and labs.  Questions were answered to the patient's satisfaction.     Rollins Wrightson

## 2015-03-21 NOTE — Anesthesia Postprocedure Evaluation (Signed)
Anesthesia Post Note  Patient: [name]  Procedure(s) Performed: Procedure(s) (LRB): CATARACT EXTRACTION PHACO AND INTRAOCULAR LENS PLACEMENT ([facility]) (Left)  Patient location during evaluation: Short Stay Anesthesia Type: MAC Level of consciousness: awake and alert and oriented Pain management: pain level controlled Vital Signs Assessment: post-procedure vital signs reviewed and stable Respiratory status: spontaneous breathing Cardiovascular status: stable Anesthetic complications: no    Last Vitals:  Filed Vitals:   03/21/15 0847 03/21/15 0851  BP: 118/72 117/67  Pulse:  75  Temp: 36.2 C   Resp: 16     Last Pain:  Filed Vitals:   03/21/15 0857  PainSc: 0-No pain                 Lanora Manis

## 2015-03-21 NOTE — Discharge Instructions (Addendum)
See handoutAMBULATORY SURGERY  °DISCHARGE INSTRUCTIONS ° ° °1) The drugs that you were given will stay in your system until tomorrow so for the next 24 hours you should not: ° °A) Drive an automobile °B) Make any legal decisions °C) Drink any alcoholic beverage ° ° °2) You may resume regular meals tomorrow.  Today it is better to start with liquids and gradually work up to solid foods. ° °You may eat anything you prefer, but it is better to start with liquids, then soup and crackers, and gradually work up to solid foods. ° ° °3) Please notify your doctor immediately if you have any unusual bleeding, trouble breathing, redness and pain at the surgery site, drainage, fever, or pain not relieved by medication. ° ° ° °4) Additional Instructions: ° ° ° ° ° ° ° °Please contact your physician with any problems or Same Day Surgery at [phone], Monday through Friday 6 am to 4 pm, or [place] [facility] [place] Main number at [phone].AMBULATORY SURGERY  °DISCHARGE INSTRUCTIONS ° ° °5) The drugs that you were given will stay in your system until tomorrow so for the next 24 hours you should not: ° °D) Drive an automobile °E) Make any legal decisions °F) Drink any alcoholic beverage ° ° °6) You may resume regular meals tomorrow.  Today it is better to start with liquids and gradually work up to solid foods. ° °You may eat anything you prefer, but it is better to start with liquids, then soup and crackers, and gradually work up to solid foods. ° ° °7) Please notify your doctor immediately if you have any unusual bleeding, trouble breathing, redness and pain at the surgery site, drainage, fever, or pain not relieved by medication. ° ° ° °8) Additional Instructions: ° ° ° ° ° ° ° °Please contact your physician with any problems or Same Day Surgery at [phone], Monday through Friday 6 am to 4 pm, or [place] [facility] [place] Main number at [phone]. °

## 2015-03-21 NOTE — Transfer of Care (Signed)
Immediate Anesthesia Transfer of Care Note  Patient: [name]  Procedure(s) Performed: Procedure(s) with comments: CATARACT EXTRACTION PHACO AND INTRAOCULAR LENS PLACEMENT (IOC) (Left) - [name] 01:21 AP% 25.6 CDE 39.78 fluid pack lot # [id number]  Patient Location: PACU  Anesthesia Type:MAC  Level of Consciousness: awake, alert  and oriented  Airway & Oxygen Therapy: Patient Spontanous Breathing  Post-op Assessment: Report given to RN and Post -op Vital signs reviewed and stable  Post vital signs: Reviewed and stable  Last Vitals:  Filed Vitals:   03/21/15 0652 03/21/15 0847  BP: 136/88 118/72  Pulse: 73   Temp: 36.6 C 36.2 C  Resp: 18 16    Complications: No apparent anesthesia complications

## 2015-04-01 ENCOUNTER — Encounter: Payer: Self-pay | Admitting: Family Medicine

## 2015-04-01 ENCOUNTER — Ambulatory Visit (INDEPENDENT_AMBULATORY_CARE_PROVIDER_SITE_OTHER): Payer: PPO | Admitting: Family Medicine

## 2015-04-01 VITALS — BP 122/70 | HR 97 | Temp 98.1°F | Resp 19 | Ht 62.0 in | Wt 172.9 lb

## 2015-04-01 DIAGNOSIS — F410 Panic disorder [episodic paroxysmal anxiety] without agoraphobia: Secondary | ICD-10-CM

## 2015-04-01 MED ORDER — ALPRAZOLAM 0.5 MG PO TABS: 0.5000 mg | ORAL_TABLET | Freq: Three times a day (TID) | ORAL | Status: DC

## 2015-04-01 MED ORDER — ALPRAZOLAM 1 MG PO TABS: 1.0000 mg | ORAL_TABLET | Freq: Every evening | ORAL | Status: DC | PRN

## 2015-04-01 NOTE — Progress Notes (Signed)
Name: [name]   MRN: [id number]    DOB: Aug 15, 1950   Date:04/01/2015       Progress Note  Subjective  Chief Complaint  Chief Complaint  Patient presents with  . Follow-up    1 mo  . Medication Refill    xanax    Anxiety Presents for follow-up visit. The problem has been unchanged. Symptoms include depressed mood, excessive worry, insomnia, irritability, nervous/anxious behavior and panic. The quality of sleep is fair.   Past treatments include benzodiazephines. Compliance with prior treatments has been good.    Past Medical History  Diagnosis Date  . GERD (gastroesophageal reflux disease)   . Hypertension   . Chronic back pain   . Anxiety   . Depression   . Thyroid disease   . Psoriasis   . Pneumonia     RECENT X 3  . Arthritis     Past Surgical History  Procedure Laterality Date  . Bladder stimulator    . Hand surgery Right   . Oophorectomy Right     ovarian cyst  . Abdominal hysterectomy      heavy bleeding  . Cataract extraction w/phaco Right 03/07/2015    Procedure: CATARACT EXTRACTION PHACO AND INTRAOCULAR LENS PLACEMENT (IOC);  Surgeon: Estill Cotta, MD;  Location: [facility] ORS;  Service: Ophthalmology;  Laterality: Right;  [name] 01:14 AP% 26.4 CDE 33.04 fluid pack lot # [id number]  . Cataract extraction w/phaco Left 03/21/2015    Procedure: CATARACT EXTRACTION PHACO AND INTRAOCULAR LENS PLACEMENT (IOC);  Surgeon: Estill Cotta, MD;  Location: [facility] ORS;  Service: Ophthalmology;  Laterality: Left;  [name] 01:21 AP% 25.6 CDE 39.78 fluid pack lot # [id number]    Family History  Problem Relation Age of Onset  . Heart disease Mother   . COPD Father     Social History   Social History  . Marital Status: Widowed    Spouse Name: N/A  . Number of Children: N/A  . Years of Education: N/A   Occupational History  . Not on file.   Social History Main Topics  . Smoking status: Current Every Day Smoker -- 1.00 packs/day    Types: Cigarettes  . Smokeless  tobacco: Not on file  . Alcohol Use: No  . Drug Use: No  . Sexual Activity: No   Other Topics Concern  . Not on file   Social History Narrative     Current outpatient prescriptions:  .  acetaminophen (TYLENOL) 500 MG tablet, Take 500 mg by mouth every 6 (six) hours as needed., Disp: , Rfl:  .  ALPRAZolam (XANAX) 0.5 MG tablet, Take 0.5 mg by mouth 3 (three) times daily., Disp: , Rfl:  .  ALPRAZolam (XANAX) 1 MG tablet, Take 1 tablet (1 mg total) by mouth at bedtime as needed for anxiety., Disp: 30 tablet, Rfl: 0 .  ALPRAZolam (XANAX) 1 MG tablet, Take 1 mg by mouth at bedtime., Disp: , Rfl:  .  amLODipine (NORVASC) 5 MG tablet, Take 1 tablet (5 mg total) by mouth daily. (Patient taking differently: Take 5 mg by mouth at bedtime. ), Disp: 90 tablet, Rfl: 0 .  citalopram (CELEXA) 20 MG tablet, Take 1 tablet (20 mg total) by mouth daily. (Patient taking differently: Take 20 mg by mouth every morning. ), Disp: 90 tablet, Rfl: 0 .  clobetasol ointment (TEMOVATE) AB-123456789 %, Apply 1 application topically daily. Use twice a day for 2 weeks, then daily for 4 weeks., Disp: 30 g, Rfl: 3 .  DUREZOL 0.05 % EMUL, INSTILL 1 DROP TWICE DAILY IN LEFT EYE AFTER PATCH IS REMOVED, Disp: , Rfl: 0 .  ILEVRO 0.3 % ophthalmic suspension, INSTILL 1 DROP IN LEFT EYE 2 DAYS PRIOR TO SURGERY AND 1 DROP MORNING OF AND CONT WITH 1 DROP ONCE DAILY UNTIL GONE, Disp: , Rfl: 0 .  levothyroxine (SYNTHROID, LEVOTHROID) 100 MCG tablet, Take 1 tablet (100 mcg total) by mouth daily before breakfast., Disp: 30 tablet, Rfl: 1 .  lisinopril (PRINIVIL,ZESTRIL) 10 MG tablet, Take 0.5 tablets (5 mg total) by mouth every morning. Take 5 mg every morning. (Patient taking differently: Take 10 mg by mouth every morning. Take 5 mg every morning.), Disp: 90 tablet, Rfl: 0 .  Secukinumab (COSENTYX [place]), Inject into the skin every 30 (thirty) days., Disp: , Rfl:  .  Vitamin D, Ergocalciferol, (DRISDOL) 50000 units CAPS capsule, Take 1 capsule  (50,000 Units total) by mouth once a week. For 12 weeks (Patient not taking: Reported on 04/01/2015), Disp: 12 capsule, Rfl: 0  Allergies  Allergen Reactions  . Ciprofloxacin Hives and Diarrhea  . Oxycodone Diarrhea  . Oxycodone-Acetaminophen Diarrhea     Review of Systems  Constitutional: Positive for irritability.  Psychiatric/Behavioral: The patient is nervous/anxious and has insomnia.      Objective  Filed Vitals:   04/01/15 0930  BP: 122/70  Pulse: 97  Temp: 98.1 F (36.7 C)  TempSrc: Oral  Resp: 19  Height: 5\' 2"  (1.575 m)  Weight: 172 lb 14.4 oz (78.427 kg)  SpO2: 96%    Physical Exam  Constitutional: She is oriented to person, place, and time and well-developed, well-nourished, and in no distress.  Cardiovascular: Normal rate and regular rhythm.   Pulmonary/Chest: Effort normal and breath sounds normal.  Neurological: She is alert and oriented to person, place, and time.  Skin: Skin is warm and dry.  Psychiatric: Mood, memory, affect and judgment normal.  Nursing note and vitals reviewed.    Assessment & Plan   1. Panic disorder without agoraphobia Stable and responsive to alprazolam. Compliant with controlled substances agreement. Refills provided. - ALPRAZolam (XANAX) 1 MG tablet; Take 1 tablet (1 mg total) by mouth at bedtime as needed for anxiety.  Dispense: 30 tablet; Refill: 0 - ALPRAZolam (XANAX) 0.5 MG tablet; Take 1 tablet (0.5 mg total) by mouth 3 (three) times daily.  Dispense: 90 tablet; Refill: 0   [name] [facility] 04/01/2015 9:54 AM

## 2015-04-07 ENCOUNTER — Telehealth: Payer: Self-pay

## 2015-04-07 MED ORDER — LEVOTHYROXINE SODIUM 100 MCG PO TABS
100.0000 ug | ORAL_TABLET | Freq: Every day | ORAL | Status: DC
Start: 2015-04-07 — End: 2015-05-27

## 2015-04-07 NOTE — Telephone Encounter (Signed)
Medication has been refilled and sent to [facility] °

## 2015-04-12 DIAGNOSIS — Z961 Presence of intraocular lens: Secondary | ICD-10-CM | POA: Diagnosis not present

## 2015-04-14 ENCOUNTER — Ambulatory Visit (INDEPENDENT_AMBULATORY_CARE_PROVIDER_SITE_OTHER): Payer: PPO | Admitting: Obstetrics and Gynecology

## 2015-04-14 ENCOUNTER — Encounter: Payer: Self-pay | Admitting: Obstetrics and Gynecology

## 2015-04-14 VITALS — BP 133/75 | HR 101 | Ht 62.0 in | Wt 173.4 lb

## 2015-04-14 DIAGNOSIS — L900 Lichen sclerosus et atrophicus: Secondary | ICD-10-CM

## 2015-04-14 DIAGNOSIS — L0232 Furuncle of buttock: Secondary | ICD-10-CM

## 2015-04-14 DIAGNOSIS — N762 Acute vulvitis: Secondary | ICD-10-CM | POA: Diagnosis not present

## 2015-04-14 NOTE — Progress Notes (Signed)
GYN ENCOUNTER NOTE  Subjective:       [name] is a 65 y.o. G46P2002 female is here for gynecologic evaluation of the following issues:  1. Six week follow up on Vulvar Biopsy: Patient has done well since her punch biopsy, six weeks ago She is not experiencing any significant pain, bleeding, fever, or discharge. 2. Chronic Vulvitis. 3. Vulvar lichen sclerosis   4. Buttock Ulceration x 3 weeks  She has completed lichen sclerosus therapy twice a day for 2 weeks, followed by daily application for 4 weeks.   Gynecologic History No LMP recorded. Patient is postmenopausal. Contraception: post menopausal status  Obstetric History OB History  Gravida Para Term Preterm AB SAB TAB Ectopic Multiple Living  2 2 2       2     # Outcome Date GA Lbr Len/2nd Weight Sex Delivery Anes PTL Lv  2 Term 1972   6 lb 6.4 oz (2.903 kg) M Vag-Spont   Y  1 Term 1969   7 lb 3.2 oz (3.266 kg) M Vag-Spont   Y      Past Medical History  Diagnosis Date  . GERD (gastroesophageal reflux disease)   . Hypertension   . Chronic back pain   . Anxiety   . Depression   . Thyroid disease   . Psoriasis   . Pneumonia     RECENT X 3  . Arthritis     Past Surgical History  Procedure Laterality Date  . Bladder stimulator    . Hand surgery Right   . Oophorectomy Right     ovarian cyst  . Abdominal hysterectomy      heavy bleeding  . Cataract extraction w/phaco Right 03/07/2015    Procedure: CATARACT EXTRACTION PHACO AND INTRAOCULAR LENS PLACEMENT (IOC);  Surgeon: Estill Cotta, MD;  Location: [facility]S;  Service: Ophthalmology;  Laterality: Right;  [name] 01:14 AP% 26.4 CDE 33.04 fluid pack lot # [id number]  . Cataract extraction w/phaco Left 03/21/2015    Procedure: CATARACT EXTRACTION PHACO AND INTRAOCULAR LENS PLACEMENT (IOC);  Surgeon: Estill Cotta, MD;  Location: [facility] ORS;  Service: Ophthalmology;  Laterality: Left;  [name] 01:21 AP% 25.6 CDE 39.78 fluid pack lot # [id number]    Current Outpatient  Prescriptions on File Prior to Visit  Medication Sig Dispense Refill  . acetaminophen (TYLENOL) 500 MG tablet Take 500 mg by mouth every 6 (six) hours as needed.    . ALPRAZolam (XANAX) 0.5 MG tablet Take 1 tablet (0.5 mg total) by mouth 3 (three) times daily. 90 tablet 0  . amLODipine (NORVASC) 5 MG tablet Take 1 tablet (5 mg total) by mouth daily. (Patient taking differently: Take 5 mg by mouth at bedtime. ) 90 tablet 0  . citalopram (CELEXA) 20 MG tablet Take 1 tablet (20 mg total) by mouth daily. (Patient taking differently: Take 20 mg by mouth every morning. ) 90 tablet 0  . clobetasol ointment (TEMOVATE) AB-123456789 % Apply 1 application topically daily. Use twice a day for 2 weeks, then daily for 4 weeks. 30 g 3  . levothyroxine (SYNTHROID, LEVOTHROID) 100 MCG tablet Take 1 tablet (100 mcg total) by mouth daily before breakfast. 30 tablet 1  . lisinopril (PRINIVIL,ZESTRIL) 10 MG tablet Take 0.5 tablets (5 mg total) by mouth every morning. Take 5 mg every morning. (Patient taking differently: Take 10 mg by mouth every morning. Take 5 mg every morning.) 90 tablet 0  . Secukinumab (COSENTYX [place]) Inject into the skin every 30 (thirty) days.    Marland Kitchen  Vitamin D, Ergocalciferol, (DRISDOL) 50000 units CAPS capsule Take 1 capsule (50,000 Units total) by mouth once a week. For 12 weeks 12 capsule 0  . ALPRAZolam (XANAX) 1 MG tablet Take 1 tablet (1 mg total) by mouth at bedtime as needed for anxiety. 30 tablet 0   No current facility-administered medications on file prior to visit.    Allergies  Allergen Reactions  . Ciprofloxacin Hives and Diarrhea  . Oxycodone Diarrhea  . Oxycodone-Acetaminophen Diarrhea    Social History   Social History  . Marital Status: Widowed    Spouse Name: N/A  . Number of Children: N/A  . Years of Education: N/A   Occupational History  . Not on file.   Social History Main Topics  . Smoking status: Current Every Day Smoker -- 1.00 packs/day    Types: Cigarettes  .  Smokeless tobacco: Not on file  . Alcohol Use: No  . Drug Use: No  . Sexual Activity: No   Other Topics Concern  . Not on file   Social History Narrative    Family History  Problem Relation Age of Onset  . Heart disease Mother   . COPD Father     The following portions of the patient's history were reviewed and updated as appropriate: allergies, current medications, past family history, past medical history, past social history, past surgical history and problem list.  Review of Systems  Review of Systems - General ROS: negative for - chills, fatigue, fever, hot flashes, malaise or night sweats Hematological and Lymphatic ROS: negative for - bleeding problems or swollen lymph nodes Gastrointestinal ROS: negative for - abdominal pain, blood in stools, change in bowel habits and nausea/vomiting Genito-Urinary ROS: negative for - change in menstrual cycle, dysmenorrhea, dyspareunia, dysuria, genital discharge, genital ulcers, hematuria, incontinence, irregular/heavy menses, nocturia or pelvic pain  Objective:   BP 133/75 mmHg  Pulse 101  Ht 5\' 2"  (1.575 m)  Wt 173 lb 6.4 oz (78.654 kg)  BMI 31.71 kg/m2 Pleasant elderly female in no acute distress. Abdomen: Soft, nontender. Pelvic exam: External Genitalia: Symmetric vulvar lichenification;previous erosions at the introitus are resolved; chronic skin changes extends to the perianal region as well. Right labia majora biopsy site is healed. Unroofed ulcer present on Right buttock,without surrounding erythema or induration  BUS: Normal Vagina:Atrophic; no lesions     Assessment:   1. Lichen sclerosus, improving  2. Vulvitis; symptoms resolving  3. Boil of buttock: Unrooted ulcer, uncertain etiology, without associated cellulitis     Plan:   1. Continue Temovate ointment 0.05%-applied daily for 4 weeks. 2. Return in 4 weeks for follow-up. 3. Consider blow drying Perineum After bathing   A  total of 15 minutes were spent face-to-face with the patient during this encounter and over half of that time dealt with counseling and coordination of care.  Luz Lex PA-S Brayton Mars, MD   I have seen, interviewed, and examined the patient in conjunction with the [facility].A. student and affirm the diagnosis and management plan. Tamica Covell A. Quang Thorpe, MD, FACOG   Note: This dictation was prepared with Dragon dictation along with smaller phrase technology. Any transcriptional errors that result from this process are unintentional.

## 2015-04-14 NOTE — Patient Instructions (Signed)
1. Continue Temovate ointment daily for 4 weeks 2. Return in 4 weeks for recheck

## 2015-04-15 DIAGNOSIS — L900 Lichen sclerosus et atrophicus: Secondary | ICD-10-CM | POA: Insufficient documentation

## 2015-04-29 ENCOUNTER — Ambulatory Visit (INDEPENDENT_AMBULATORY_CARE_PROVIDER_SITE_OTHER): Payer: PPO | Admitting: Family Medicine

## 2015-04-29 ENCOUNTER — Encounter: Payer: Self-pay | Admitting: Family Medicine

## 2015-04-29 VITALS — BP 128/68 | HR 99 | Temp 98.0°F | Resp 18 | Ht 62.0 in | Wt 173.3 lb

## 2015-04-29 DIAGNOSIS — F410 Panic disorder [episodic paroxysmal anxiety] without agoraphobia: Secondary | ICD-10-CM

## 2015-04-29 MED ORDER — ALPRAZOLAM 1 MG PO TABS: 1.0000 mg | ORAL_TABLET | Freq: Every evening | ORAL | Status: DC | PRN

## 2015-04-29 MED ORDER — ALPRAZOLAM 0.5 MG PO TABS: 0.5000 mg | ORAL_TABLET | Freq: Three times a day (TID) | ORAL | Status: DC

## 2015-04-29 NOTE — Progress Notes (Signed)
Name: [name]   MRN: [id number]    DOB: Jun 21, 1950   Date:04/29/2015       Progress Note  Subjective  Chief Complaint  Chief Complaint  Patient presents with  . panic disorder without agoraphobia    pt here for 1 month follow up  . muscle cramps    HPI  Panic Disorder: Symptoms include anxiety, often leading to panic attacks. During a panic attack, she feels dizzy, heart races, feels like she is going to pass out. Also has generalized anxiety, stays nervous and anxious.  Takes Alprazolam 0.5mg  TID PRN during the day and 1 mg at bedtime. This helps relieve her symptoms of anxiety.   Past Medical History  Diagnosis Date  . GERD (gastroesophageal reflux disease)   . Hypertension   . Chronic back pain   . Anxiety   . Depression   . Thyroid disease   . Psoriasis   . Pneumonia     RECENT X 3  . Arthritis     Past Surgical History  Procedure Laterality Date  . Bladder stimulator    . Hand surgery Right   . Oophorectomy Right     ovarian cyst  . Abdominal hysterectomy      heavy bleeding  . Cataract extraction w/phaco Right 03/07/2015    Procedure: CATARACT EXTRACTION PHACO AND INTRAOCULAR LENS PLACEMENT (IOC);  Surgeon: Estill Cotta, MD;  Location: [facility] ORS;  Service: Ophthalmology;  Laterality: Right;  [name] 01:14 AP% 26.4 CDE 33.04 fluid pack lot # [id number]  . Cataract extraction w/phaco Left 03/21/2015    Procedure: CATARACT EXTRACTION PHACO AND INTRAOCULAR LENS PLACEMENT (IOC);  Surgeon: Estill Cotta, MD;  Location: [facility] ORS;  Service: Ophthalmology;  Laterality: Left;  [name] 01:21 AP% 25.6 CDE 39.78 fluid pack lot # [id number]    Family History  Problem Relation Age of Onset  . Heart disease Mother   . COPD Father     Social History   Social History  . Marital Status: Widowed    Spouse Name: N/A  . Number of Children: N/A  . Years of Education: N/A   Occupational History  . Not on file.   Social History Main Topics  . Smoking status: Current  Every Day Smoker -- 1.00 packs/day    Types: Cigarettes  . Smokeless tobacco: Not on file  . Alcohol Use: No  . Drug Use: No  . Sexual Activity: No   Other Topics Concern  . Not on file   Social History Narrative     Current outpatient prescriptions:  .  acetaminophen (TYLENOL) 500 MG tablet, Take 500 mg by mouth every 6 (six) hours as needed., Disp: , Rfl:  .  ALPRAZolam (XANAX) 0.5 MG tablet, Take 1 tablet (0.5 mg total) by mouth 3 (three) times daily., Disp: 90 tablet, Rfl: 0 .  ALPRAZolam (XANAX) 1 MG tablet, Take 1 tablet (1 mg total) by mouth at bedtime as needed for anxiety., Disp: 30 tablet, Rfl: 0 .  amLODipine (NORVASC) 5 MG tablet, Take 1 tablet (5 mg total) by mouth daily. (Patient taking differently: Take 5 mg by mouth at bedtime. ), Disp: 90 tablet, Rfl: 0 .  citalopram (CELEXA) 20 MG tablet, Take 1 tablet (20 mg total) by mouth daily. (Patient taking differently: Take 20 mg by mouth every morning. ), Disp: 90 tablet, Rfl: 0 .  clobetasol ointment (TEMOVATE) AB-123456789 %, Apply 1 application topically daily. Use twice a day for 2 weeks, then daily for 4 weeks., Disp:  30 g, Rfl: 3 .  levothyroxine (SYNTHROID, LEVOTHROID) 100 MCG tablet, Take 1 tablet (100 mcg total) by mouth daily before breakfast., Disp: 30 tablet, Rfl: 1 .  lisinopril (PRINIVIL,ZESTRIL) 10 MG tablet, Take 0.5 tablets (5 mg total) by mouth every morning. Take 5 mg every morning. (Patient taking differently: Take 10 mg by mouth every morning. Take 5 mg every morning.), Disp: 90 tablet, Rfl: 0 .  Secukinumab (COSENTYX [place]), Inject into the skin every 30 (thirty) days., Disp: , Rfl:  .  Vitamin D, Ergocalciferol, (DRISDOL) 50000 units CAPS capsule, Take 1 capsule (50,000 Units total) by mouth once a week. For 12 weeks, Disp: 12 capsule, Rfl: 0  Allergies  Allergen Reactions  . Ciprofloxacin Hives and Diarrhea  . Oxycodone Diarrhea  . Oxycodone-Acetaminophen Diarrhea     Review of Systems  Respiratory: Negative  for shortness of breath.   Cardiovascular: Negative for chest pain and palpitations.  Psychiatric/Behavioral: Positive for depression. The patient is nervous/anxious and has insomnia.      Objective  Filed Vitals:   04/29/15 0828  BP: 128/68  Pulse: 99  Temp: 98 F (36.7 C)  Resp: 18  Height: 5\' 2"  (1.575 m)  Weight: 173 lb 5 oz (78.614 kg)  SpO2: 96%    Physical Exam  Constitutional: She is oriented to person, place, and time and well-developed, well-nourished, and in no distress.  Cardiovascular: Normal rate and regular rhythm.   Pulmonary/Chest: Effort normal and breath sounds normal.  Neurological: She is alert and oriented to person, place, and time.  Psychiatric: Mood, memory, affect and judgment normal.  Nursing note and vitals reviewed.     Assessment & Plan  1. Panic disorder without agoraphobia Symptoms stable on alprazolam, patient compliant with controlled substances agreement. Refills provided and follow-up in one month - ALPRAZolam (XANAX) 0.5 MG tablet; Take 1 tablet (0.5 mg total) by mouth 3 (three) times daily.  Dispense: 90 tablet; Refill: 0 - ALPRAZolam (XANAX) 1 MG tablet; Take 1 tablet (1 mg total) by mouth at bedtime as needed for anxiety.  Dispense: 30 tablet; Refill: 0  Esperansa Sarabia [facility]ad A[facility] 04/29/2015 8:38 AM

## 2015-05-12 ENCOUNTER — Encounter: Payer: Self-pay | Admitting: Obstetrics and Gynecology

## 2015-05-12 ENCOUNTER — Ambulatory Visit (INDEPENDENT_AMBULATORY_CARE_PROVIDER_SITE_OTHER): Payer: PPO | Admitting: Obstetrics and Gynecology

## 2015-05-12 VITALS — BP 120/72 | HR 101 | Ht 62.0 in | Wt 176.5 lb

## 2015-05-12 DIAGNOSIS — L900 Lichen sclerosus et atrophicus: Secondary | ICD-10-CM

## 2015-05-12 DIAGNOSIS — L98419 Non-pressure chronic ulcer of buttock with unspecified severity: Secondary | ICD-10-CM | POA: Diagnosis not present

## 2015-05-12 NOTE — Patient Instructions (Signed)
1. Continue Temovate ointment 0.05% daily to the perineum as directed 2. Continue cystic mass daily for nonhealed gluteal ulcer 3. Return in 4 weeks for follow-up 4. Obtained random blood sugar and hemoglobin A1c today

## 2015-05-12 NOTE — Progress Notes (Signed)
Chief complaint: 1. Chronic vulvitis 2. Vulvar lichen sclerosis 3. Gluteus ulcer  Patient presents for follow-up on above issues. Chronic vulvitis/lichen sclerosus: Symptoms of itching and burning are markedly reduced. Over the past 4 weeks she has used Temovate ointment daily. Gluteus ulcer: The ulcer on her right gluteus is not healed. It is very tender. She has been doing Sitz baths Daily. No recent assessment for diabetes or sugar control  Past medical history, past surgical history, problem list, medications, allergies are reviewed  OBJECTIVE: BP 120/72 mmHg  Pulse 101  Ht 5\' 2"  (1.575 m)  Wt 176 lb 8 oz (80.06 kg)  BMI 32.27 kg/m2 Pelvic exam: External Genitalia: Symmetric vulvar lichenification;previous erosions at the introitus are resolved; chronic skin changes extends to the perianal region as well. Right labia majora biopsy site is healed.  Chronic ulcer 1.5 x 1 cm, Right buttock, with surrounding erythema without induration. Non weeping at present BUS: Normal Vagina:Atrophic; no lesions  ASSESSMENT: 1. Lichen sclerosus, still with significant chronic skin changes, but with reduction in symptoms. 2., Symptoms resolving 3. Gluteus ulcer, nonhealing, noninfected  PLAN: 1. Continue with Temovate ointment 0.05% daily to perineum as directed 2. Continue with sitz baths daily 3. Random blood sugar and hemoglobin A1c ordered today 4. Return in 4 weeks  A total of 15 minutes were spent face-to-face with the patient during this encounter and over half of that time dealt with counseling and coordination of care.  Brayton Mars, MD  Note: This dictation was prepared with Dragon dictation along with smaller phrase technology. Any transcriptional errors that result from this process are unintentional.

## 2015-05-13 LAB — GLUCOSE, RANDOM: GLUCOSE: 135 mg/dL — AB (ref 65–99)

## 2015-05-13 LAB — HEMOGLOBIN A1C
ESTIMATED AVERAGE GLUCOSE: 126 mg/dL
Hgb A1c MFr Bld: 6 % — ABNORMAL HIGH (ref 4.8–5.6)

## 2015-05-20 ENCOUNTER — Other Ambulatory Visit: Payer: Self-pay | Admitting: Family Medicine

## 2015-05-20 DIAGNOSIS — I10 Essential (primary) hypertension: Secondary | ICD-10-CM

## 2015-05-20 NOTE — Telephone Encounter (Signed)
[name] from [facility] states that she has been trying to get a refill on Amlodipine. patient has not had any for the past week and a half

## 2015-05-23 MED ORDER — AMLODIPINE BESYLATE 5 MG PO TABS: 5.0000 mg | ORAL_TABLET | Freq: Every day | ORAL | Status: DC

## 2015-05-23 NOTE — Telephone Encounter (Signed)
Medication has been refilled and sent to [facility]. °

## 2015-05-26 ENCOUNTER — Encounter: Payer: Self-pay | Admitting: Family Medicine

## 2015-05-26 ENCOUNTER — Ambulatory Visit (INDEPENDENT_AMBULATORY_CARE_PROVIDER_SITE_OTHER): Payer: PPO | Admitting: Family Medicine

## 2015-05-26 VITALS — BP 120/80 | HR 107 | Temp 97.6°F | Resp 18 | Ht 62.0 in | Wt 175.0 lb

## 2015-05-26 DIAGNOSIS — F410 Panic disorder [episodic paroxysmal anxiety] without agoraphobia: Secondary | ICD-10-CM | POA: Diagnosis not present

## 2015-05-26 MED ORDER — ALPRAZOLAM 1 MG PO TABS: 1.0000 mg | ORAL_TABLET | Freq: Every evening | ORAL | Status: DC | PRN

## 2015-05-26 MED ORDER — ALPRAZOLAM 0.5 MG PO TABS: 0.5000 mg | ORAL_TABLET | Freq: Three times a day (TID) | ORAL | Status: DC | PRN

## 2015-05-26 NOTE — Progress Notes (Signed)
Name: [name]   MRN: [id number]    DOB: 07/13/1950   Date:05/26/2015       Progress Note  Subjective  Chief Complaint  Chief Complaint  Patient presents with  . Follow-up    1 mo  . Medication Refill    xanax    HPI  Anxiety: Pt. Returns for follow up of anxiety, symptoms recently worse (friends from church recently passed away). She is taking Alprazolam 0.5mg  three times daily as needed and 1 mg at night. Because of recent stressors, she does admit to taking two extra  tablets of 0.5mg . She will run out on April 22nd, 2017.  Past Medical History  Diagnosis Date  . GERD (gastroesophageal reflux disease)   . Hypertension   . Chronic back pain   . Anxiety   . Depression   . Thyroid disease   . Psoriasis   . Pneumonia     RECENT X 3  . Arthritis     Past Surgical History  Procedure Laterality Date  . Bladder stimulator    . Hand surgery Right   . Oophorectomy Right     ovarian cyst  . Abdominal hysterectomy      heavy bleeding  . Cataract extraction w/phaco Right 03/07/2015    Procedure: CATARACT EXTRACTION PHACO AND INTRAOCULAR LENS PLACEMENT (IOC);  Surgeon: Jill Cotta, MD;  Location: [facility] ORS;  Service: Ophthalmology;  Laterality: Right;  [name] 01:14 AP% 26.4 CDE 33.04 fluid pack lot # [id number]  . Cataract extraction w/phaco Left 03/21/2015    Procedure: CATARACT EXTRACTION PHACO AND INTRAOCULAR LENS PLACEMENT (IOC);  Surgeon: Jill Cotta, MD;  Location: [facility] ORS;  Service: Ophthalmology;  Laterality: Left;  [name] 01:21 AP% 25.6 CDE 39.78 fluid pack lot # [id number]    Family History  Problem Relation Age of Onset  . Heart disease Mother   . COPD Father     Social History   Social History  . Marital Status: Widowed    Spouse Name: N/A  . Number of Children: N/A  . Years of Education: N/A   Occupational History  . Not on file.   Social History Main Topics  . Smoking status: Current Every Day Smoker -- 1.00 packs/day    Types: Cigarettes  .  Smokeless tobacco: Not on file  . Alcohol Use: No  . Drug Use: No  . Sexual Activity: No   Other Topics Concern  . Not on file   Social History Narrative     Current outpatient prescriptions:  .  acetaminophen (TYLENOL) 500 MG tablet, Take 500 mg by mouth every 6 (six) hours as needed., Disp: , Rfl:  .  ALPRAZolam (XANAX) 0.5 MG tablet, Take 1 tablet (0.5 mg total) by mouth 3 (three) times daily., Disp: 90 tablet, Rfl: 0 .  ALPRAZolam (XANAX) 1 MG tablet, Take 1 tablet (1 mg total) by mouth at bedtime as needed for anxiety., Disp: 30 tablet, Rfl: 0 .  amLODipine (NORVASC) 5 MG tablet, Take 1 tablet (5 mg total) by mouth daily., Disp: 90 tablet, Rfl: 0 .  citalopram (CELEXA) 20 MG tablet, Take 1 tablet (20 mg total) by mouth daily. (Patient taking differently: Take 20 mg by mouth every morning. ), Disp: 90 tablet, Rfl: 0 .  clobetasol ointment (TEMOVATE) AB-123456789 %, Apply 1 application topically daily. Use twice a day for 2 weeks, then daily for 4 weeks., Disp: 30 g, Rfl: 3 .  levothyroxine (SYNTHROID, LEVOTHROID) 100 MCG tablet, Take 1 tablet (100 mcg  total) by mouth daily before breakfast., Disp: 30 tablet, Rfl: 1 .  lisinopril (PRINIVIL,ZESTRIL) 10 MG tablet, Take 0.5 tablets (5 mg total) by mouth every morning. Take 5 mg every morning. (Patient taking differently: Take 10 mg by mouth every morning. Take 5 mg every morning.), Disp: 90 tablet, Rfl: 0 .  Secukinumab (COSENTYX ), Inject into the skin every 30 (thirty) days., Disp: , Rfl:  .  Vitamin D, Ergocalciferol, (DRISDOL) 50000 units CAPS capsule, Take 1 capsule (50,000 Units total) by mouth once a week. For 12 weeks (Patient not taking: Reported on 05/26/2015), Disp: 12 capsule, Rfl: 0  Allergies  Allergen Reactions  . Ciprofloxacin Hives and Diarrhea  . Oxycodone Diarrhea  . Oxycodone-Acetaminophen Diarrhea     Review of Systems  Psychiatric/Behavioral: The patient is nervous/anxious and has insomnia.      Objective  Filed  Vitals:   05/26/15 0914  BP: 120/80  Pulse: 107  Temp: 97.6 F (36.4 C)  TempSrc: Oral  Resp: 18  Height: 5\' 2"  (1.575 m)  Weight: 175 lb (79.379 kg)  SpO2: 95%    Physical Exam  Constitutional: She is oriented to person, place, and time and well-developed, well-nourished, and in no distress.  HENT:  Head: Normocephalic and atraumatic.  Cardiovascular: Normal rate and regular rhythm.   Pulmonary/Chest: Effort normal and breath sounds normal.  Neurological: She is alert and oriented to person, place, and time.  Psychiatric: Memory, affect and judgment normal.  Nursing note and vitals reviewed.     Assessment & Plan  1. Panic disorder without agoraphobia Anxiety is moderately worse, continue on alprazolam and SSRI. Obtained urine drug screen as part of controlled substances agreement. - ALPRAZolam (XANAX) 0.5 MG tablet; Take 1 tablet (0.5 mg total) by mouth 3 (three) times daily as needed for anxiety.  Dispense: 90 tablet; Refill: 0 - ALPRAZolam (XANAX) 1 MG tablet; Take 1 tablet (1 mg total) by mouth at bedtime as needed for anxiety.  Dispense: 30 tablet; Refill: 0 - Drug Screen 12+Alcohol+CRT, Ur   [name] 05/26/2015 9:27 AM

## 2015-05-27 ENCOUNTER — Telehealth: Payer: Self-pay

## 2015-05-27 DIAGNOSIS — F32A Depression, unspecified: Secondary | ICD-10-CM

## 2015-05-27 DIAGNOSIS — F329 Major depressive disorder, single episode, unspecified: Secondary | ICD-10-CM

## 2015-05-27 MED ORDER — CITALOPRAM HYDROBROMIDE 20 MG PO TABS: 20.0000 mg | ORAL_TABLET | Freq: Every day | ORAL | Status: DC

## 2015-05-27 MED ORDER — LEVOTHYROXINE SODIUM 100 MCG PO TABS: 100.0000 ug | ORAL_TABLET | Freq: Every day | ORAL | Status: DC

## 2015-05-27 NOTE — Telephone Encounter (Signed)
Medication has been refilled and sent to [facility]. °

## 2015-05-30 ENCOUNTER — Ambulatory Visit: Payer: Self-pay | Admitting: Family Medicine

## 2015-06-01 LAB — DRUG SCREEN 12+ALCOHOL+CRT, UR
AMPHETAMINES, URINE: NEGATIVE ng/mL
BARBITURATE: NEGATIVE ng/mL
BENZODIAZ UR QL: POSITIVE ng/mL — AB
CANNABINOIDS: NEGATIVE ng/mL
COCAINE (METABOLITE): NEGATIVE ng/mL
Creatinine, Urine: 98.2 mg/dL (ref 20.0–300.0)
Ethanol U, Quan: NEGATIVE %
Meperidine: NEGATIVE ng/mL
Methadone: NEGATIVE ng/mL
OPIATE SCREEN URINE: NEGATIVE ng/mL
Oxycodone/Oxymorphone, Urine: NEGATIVE ng/mL
Phencyclidine: NEGATIVE ng/mL
Propoxyphene: NEGATIVE ng/mL
Tramadol: NEGATIVE ng/mL

## 2015-06-02 ENCOUNTER — Telehealth: Payer: Self-pay | Admitting: Family Medicine

## 2015-06-02 NOTE — Telephone Encounter (Signed)
Pt would like a call back. She is at a friends house right now [phone]

## 2015-06-03 ENCOUNTER — Encounter: Payer: Self-pay | Admitting: Family Medicine

## 2015-06-03 ENCOUNTER — Ambulatory Visit (INDEPENDENT_AMBULATORY_CARE_PROVIDER_SITE_OTHER): Payer: PPO | Admitting: Family Medicine

## 2015-06-03 VITALS — BP 120/70 | HR 111 | Temp 98.0°F | Resp 18 | Ht 62.0 in | Wt 174.9 lb

## 2015-06-03 DIAGNOSIS — N329 Bladder disorder, unspecified: Secondary | ICD-10-CM | POA: Diagnosis not present

## 2015-06-03 NOTE — Progress Notes (Signed)
Name: [name]   MRN: [id number]    DOB: 04/02/1950   Date:06/03/2015       Progress Note  Subjective  Chief Complaint  Chief Complaint  Patient presents with  . Referral    HPI  Pt. Is requesting referral to a provider for removal of a device put in her lower back in 2010 by her Gynecologist (Dr. Davis Gourd) to help regulate her bladder. Pt. Calls the device ' Innerstem nebulizer' and Dr. Davis Gourd described that the device would 'train her bladder to empty normally' She believes that the device is no longer working (not feeling the tingling stimulus that she used to feel when it was inserted). She is now requesting referral to a provider to remove that device.   Past Medical History  Diagnosis Date  . GERD (gastroesophageal reflux disease)   . Hypertension   . Chronic back pain   . Anxiety   . Depression   . Thyroid disease   . Psoriasis   . Pneumonia     RECENT X 3  . Arthritis     Past Surgical History  Procedure Laterality Date  . Bladder stimulator    . Hand surgery Right   . Oophorectomy Right     ovarian cyst  . Abdominal hysterectomy      heavy bleeding  . Cataract extraction w/phaco Right 03/07/2015    Procedure: CATARACT EXTRACTION PHACO AND INTRAOCULAR LENS PLACEMENT (IOC);  Surgeon: Estill Cotta, MD;  Location: [facility] ORS;  Service: Ophthalmology;  Laterality: Right;  [name] 01:14 AP% 26.4 CDE 33.04 fluid pack lot # [id number]  . Cataract extraction w/phaco Left 03/21/2015    Procedure: CATARACT EXTRACTION PHACO AND INTRAOCULAR LENS PLACEMENT (IOC);  Surgeon: Estill Cotta, MD;  Location: [facility] OR[facility];  Service: Ophthalmology;  Laterality: Left;  [name] 01:21 AP% 25.6 CDE 39.78 fluid pack lot # [id number]    Family History  Problem Relation Age of Onset  . Heart disease Mother   . COPD Father     Social History   Social History  . Marital Status: Widowed    Spouse Name: N/A  . Number of Children: N/A  . Years of Education: N/A   Occupational  History  . Not on file.   Social History Main Topics  . Smoking status: Current Every Day Smoker -- 1.00 packs/day    Types: Cigarettes  . Smokeless tobacco: Not on file  . Alcohol Use: No  . Drug Use: No  . Sexual Activity: No   Other Topics Concern  . Not on file   Social History Narrative     Current outpatient prescriptions:  .  acetaminophen (TYLENOL) 500 MG tablet, Take 500 mg by mouth every 6 (six) hours as needed., Disp: , Rfl:  .  ALPRAZolam (XANAX) 0.5 MG tablet, Take 1 tablet (0.5 mg total) by mouth 3 (three) times daily as needed for anxiety., Disp: 90 tablet, Rfl: 0 .  ALPRAZolam (XANAX) 1 MG tablet, Take 1 tablet (1 mg total) by mouth at bedtime as needed for anxiety., Disp: 30 tablet, Rfl: 0 .  amLODipine (NORVASC) 5 MG tablet, Take 1 tablet (5 mg total) by mouth daily., Disp: 90 tablet, Rfl: 0 .  citalopram (CELEXA) 20 MG tablet, Take 1 tablet (20 mg total) by mouth daily., Disp: 90 tablet, Rfl: 0 .  clobetasol ointment (TEMOVATE) AB-123456789 %, Apply 1 application topically daily. Use twice a day for 2 weeks, then daily for 4 weeks., Disp: 30 g, Rfl: 3 .  levothyroxine (SYNTHROID, LEVOTHROID) 100 MCG tablet, Take 1 tablet (100 mcg total) by mouth daily before breakfast., Disp: 10 tablet, Rfl: 0 .  lisinopril (PRINIVIL,ZESTRIL) 10 MG tablet, Take 0.5 tablets (5 mg total) by mouth every morning. Take 5 mg every morning. (Patient taking differently: Take 10 mg by mouth every morning. Take 5 mg every morning.), Disp: 90 tablet, Rfl: 0 .  Secukinumab (COSENTYX [place]), Inject into the skin every 30 (thirty) days., Disp: , Rfl:  .  Vitamin D, Ergocalciferol, (DRISDOL) 50000 units CAPS capsule, Take 1 capsule (50,000 Units total) by mouth once a week. For 12 weeks, Disp: 12 capsule, Rfl: 0  Allergies  Allergen Reactions  . Ciprofloxacin Hives and Diarrhea  . Oxycodone Diarrhea  . Oxycodone-Acetaminophen Diarrhea     ROS    Objective  Filed Vitals:   06/03/15 1111  BP:  120/70  Pulse: 111  Temp: 98 F (36.7 C)  TempSrc: Oral  Resp: 18  Height: 5\' 2"  (1.575 m)  Weight: 174 lb 14.4 oz (79.334 kg)  SpO2: 93%    Physical Exam  Constitutional: She is oriented to person, place, and time and well-developed, well-nourished, and in no distress.  Musculoskeletal:       Back:  Palpable square shaped object in the lower back, tenderness over and in the surrounding area.  Neurological: She is alert and oriented to person, place, and time.  Nursing note and vitals reviewed.    Assessment & Plan  1. Bladder disorder I am not sure as to what device was brought in by her gynecologist Dr. Davis Gourd. We will refer to urology to assist with removal. - Ambulatory referral to Urology   [facility] 06/03/2015 11:33 AM

## 2015-06-09 ENCOUNTER — Ambulatory Visit (INDEPENDENT_AMBULATORY_CARE_PROVIDER_SITE_OTHER): Payer: PPO | Admitting: Obstetrics and Gynecology

## 2015-06-09 ENCOUNTER — Encounter: Payer: Self-pay | Admitting: Obstetrics and Gynecology

## 2015-06-09 VITALS — BP 109/70 | HR 103 | Ht 62.5 in | Wt 175.2 lb

## 2015-06-09 DIAGNOSIS — L89312 Pressure ulcer of right buttock, stage 2: Secondary | ICD-10-CM

## 2015-06-09 DIAGNOSIS — R7303 Prediabetes: Secondary | ICD-10-CM

## 2015-06-09 DIAGNOSIS — E119 Type 2 diabetes mellitus without complications: Secondary | ICD-10-CM | POA: Insufficient documentation

## 2015-06-09 DIAGNOSIS — L900 Lichen sclerosus et atrophicus: Secondary | ICD-10-CM | POA: Diagnosis not present

## 2015-06-09 DIAGNOSIS — N763 Subacute and chronic vulvitis: Secondary | ICD-10-CM

## 2015-06-09 MED ORDER — CLOBETASOL PROPIONATE 0.05 % EX OINT: 1.0000 "application " | TOPICAL_OINTMENT | Freq: Every day | CUTANEOUS | Status: DC

## 2015-06-09 NOTE — Progress Notes (Signed)
GYN ENCOUNTER NOTE  Subjective:       [name] is a 65 y.o. G77P2002 female is here for gynecologic evaluation of the following issues:  1. Lichen sclerosus. Patient feels like this has improved with treatment. Still having some itching and burning 2-3x/week. Using Temovate ointment daily. 2. Right gluteal ulcer. Patient feels like this has worsened. States that ulcerated area is painful 8/10, mostly with sitting. "Feels like I'm sitting on a rock". Pain radiates through the hip, no improvement with Tylenol. Has been putting Temovate ointment and a bandaid over the ulcer. Soaks in the tub occasionally, but this is painful for her.   Gynecologic History No LMP recorded. Patient is postmenopausal. Contraception: post menopausal status  Obstetric History OB History  Gravida Para Term Preterm AB SAB TAB Ectopic Multiple Living  2 2 2       2     # Outcome Date GA Lbr Len/2nd Weight Sex Delivery Anes PTL Lv  2 Term 1972   6 lb 6.4 oz (2.903 kg) M Vag-Spont   Y  1 Term 1969   7 lb 3.2 oz (3.266 kg) M Vag-Spont   Y      Past Medical History  Diagnosis Date  . GERD (gastroesophageal reflux disease)   . Hypertension   . Chronic back pain   . Anxiety   . Depression   . Thyroid disease   . Psoriasis   . Pneumonia     RECENT X 3  . Arthritis     Past Surgical History  Procedure Laterality Date  . Bladder stimulator    . Hand surgery Right   . Oophorectomy Right     ovarian cyst  . Abdominal hysterectomy      heavy bleeding  . Cataract extraction w/phaco Right 03/07/2015    Procedure: CATARACT EXTRACTION PHACO AND INTRAOCULAR LENS PLACEMENT (IOC);  Surgeon: Estill Cotta, MD;  Location: [facility] OR[facility];  Service: Ophthalmology;  Laterality: Right;  [name] 01:14 AP% 26.4 CDE 33.04 fluid pack lot # [id number]  . Cataract extraction w/phaco Left 03/21/2015    Procedure: CATARACT EXTRACTION PHACO AND INTRAOCULAR LENS PLACEMENT (IOC);  Surgeon: Estill Cotta, MD;  Location: [facility] ORS;   Service: Ophthalmology;  Laterality: Left;  [name] 01:21 AP% 25.6 CDE 39.78 fluid pack lot # [id number]    Current Outpatient Prescriptions on File Prior to Visit  Medication Sig Dispense Refill  . acetaminophen (TYLENOL) 500 MG tablet Take 500 mg by mouth every 6 (six) hours as needed.    . ALPRAZolam (XANAX) 0.5 MG tablet Take 1 tablet (0.5 mg total) by mouth 3 (three) times daily as needed for anxiety. 90 tablet 0  . ALPRAZolam (XANAX) 1 MG tablet Take 1 tablet (1 mg total) by mouth at bedtime as needed for anxiety. 30 tablet 0  . amLODipine (NORVASC) 5 MG tablet Take 1 tablet (5 mg total) by mouth daily. 90 tablet 0  . citalopram (CELEXA) 20 MG tablet Take 1 tablet (20 mg total) by mouth daily. 90 tablet 0  . levothyroxine (SYNTHROID, LEVOTHROID) 100 MCG tablet Take 1 tablet (100 mcg total) by mouth daily before breakfast. 10 tablet 0  . lisinopril (PRINIVIL,ZESTRIL) 10 MG tablet Take 0.5 tablets (5 mg total) by mouth every morning. Take 5 mg every morning. (Patient taking differently: Take 10 mg by mouth every morning. Take 5 mg every morning.) 90 tablet 0  . Secukinumab (COSENTYX [place]) Inject into the skin every 30 (thirty) days.    . Vitamin D,  Ergocalciferol, (DRISDOL) 50000 units CAPS capsule Take 1 capsule (50,000 Units total) by mouth once a week. For 12 weeks 12 capsule 0   No current facility-administered medications on file prior to visit.    Allergies  Allergen Reactions  . Ciprofloxacin Hives and Diarrhea  . Oxycodone Diarrhea  . Oxycodone-Acetaminophen Diarrhea    Social History   Social History  . Marital Status: Widowed    Spouse Name: N/A  . Number of Children: N/A  . Years of Education: N/A   Occupational History  . Not on file.   Social History Main Topics  . Smoking status: Current Every Day Smoker -- 1.00 packs/day    Types: Cigarettes  . Smokeless tobacco: Not on file  . Alcohol Use: No  . Drug Use: No  . Sexual Activity: No   Other Topics Concern  .  Not on file   Social History Narrative    Family History  Problem Relation Age of Onset  . Heart disease Mother   . COPD Father     The following portions of the patient's history were reviewed and updated as appropriate: allergies, current medications, past family history, past medical history, past social history, past surgical history and problem list.  Review of Systems Review of Systems - Negative except as reported in HPI Review of Systems - General ROS: negative for - chills, fatigue, fever, hot flashes, malaise or night sweats Hematological and Lymphatic ROS: negative for - bleeding problems or swollen lymph nodes Gastrointestinal ROS: negative for - abdominal pain, blood in stools, change in bowel habits and nausea/vomiting Musculoskeletal ROS: negative for - joint pain, muscle pain or muscular weakness Genito-Urinary ROS: negative for - dyspareunia, dysuria, genital discharge, genital ulcers, hematuria, incontinence, nocturia or pelvic pain  Objective:   BP 109/70 mmHg  Pulse 103  Ht 5' 2.5" (1.588 m)  Wt 175 lb 3.2 oz (79.47 kg)  BMI 31.51 kg/m2 CONSTITUTIONAL: Well-developed, obese female in no acute distress.  HENT:  Normocephalic, atraumatic.  NECK: Normal range of motion, supple, no masses.   SKIN: Skin is warm and dry. No rash noted. Not diaphoretic. No erythema. No pallor. [place]: Alert and oriented to person, place, and time. PSYCHIATRIC: Normal mood and affect. Normal behavior. Normal judgment and thought content. CARDIOVASCULAR:Not Examined RESPIRATORY: Not Examined BREASTS: Not Examined ABDOMEN: Not Examined PELVIC:  External Genitalia: Symmetric vulvar lichenification;small area of epithelial breakdown at posterior fourchette; chronic skin changes extends to the perianal region as well. Right labia majora biopsy site is healed. Chronic ulcer 1.5 x 1 cm, Right buttock, with surrounding erythema without induration. Non weeping at present  BUS:  Normal MUSCULOSKELETAL: Normal range of motion.      Assessment:   1. Decubitus ulcer, buttock, right, stage II - AMB referral to wound care center  2. Chronic vulvitis  3. Lichen sclerosus  4. Prediabetes - random glucose 135 - HgbA1c 6.0     Plan:   1. Continue with Temovate 0.05% ointment daily to perineum. 2. Follow up with PCP for management of prediabetes. 3. Referral to wound care clinic for stage II ulcer on right buttocks. 4. Follow up in 4 weeks.  Claiborne Billings Rayburn PA-S Brayton Mars, MD   I have seen, interviewed, and examined the patient in conjunction with the [facility].A. student and affirm the diagnosis and management plan. Martin A. DeFrancesco, MD, FACOG   Note: This dictation was prepared with Dragon dictation along with smaller phrase technology. Any transcriptional errors that result  from this process are unintentional.

## 2015-06-09 NOTE — Patient Instructions (Addendum)
1. Continue Temovate ointment 0.05% topically to the perineum daily as directed 2. Referral to wound clinic for nonhealing right gluteus ulcer 3. Referral to primary care for management of prediabetes-Dr. Manuella Ghazi, [facility] 4. Return in 4 weeks for follow-up

## 2015-06-21 ENCOUNTER — Encounter: Payer: PPO | Attending: Internal Medicine | Admitting: Internal Medicine

## 2015-06-21 DIAGNOSIS — I10 Essential (primary) hypertension: Secondary | ICD-10-CM | POA: Diagnosis not present

## 2015-06-21 DIAGNOSIS — F17210 Nicotine dependence, cigarettes, uncomplicated: Secondary | ICD-10-CM | POA: Diagnosis not present

## 2015-06-21 DIAGNOSIS — L89309 Pressure ulcer of unspecified buttock, unspecified stage: Secondary | ICD-10-CM | POA: Diagnosis not present

## 2015-06-21 DIAGNOSIS — M1990 Unspecified osteoarthritis, unspecified site: Secondary | ICD-10-CM | POA: Diagnosis not present

## 2015-06-21 DIAGNOSIS — L89312 Pressure ulcer of right buttock, stage 2: Secondary | ICD-10-CM | POA: Insufficient documentation

## 2015-06-21 DIAGNOSIS — L409 Psoriasis, unspecified: Secondary | ICD-10-CM | POA: Insufficient documentation

## 2015-06-21 NOTE — Progress Notes (Signed)
[name], [name] ([id number]) Visit Report for 06/21/2015 Abuse/Suicide Risk Screen Details Patient Name: [name], [name]. Date of Service: 06/21/2015 8:45 AM Medical Record Patient Account Number: [id number] Number: Treating RN: Cornell Barman Jun 13, 1950 (65 y.o. Other Clinician: Date of Birth/Sex: Female) Treating ROBSON, MICHAEL Primary Care Physician/Extender: Wyline Beady, SYED Physician: DEFRANCESCO, Referring Physician: Terance Ice in Treatment: 0 Abuse/Suicide Risk Screen Items Answer ABUSE/SUICIDE RISK SCREEN: Has anyone close to you tried to hurt or harm you recentlyo No Do you feel uncomfortable with anyone in your familyo No Has anyone forced you do things that you didnot want to doo No Do you have any thoughts of harming yourselfo No Patient displays signs or symptoms of abuse and/or neglect. No Electronic Signature(s) Signed: 06/21/2015 11:45:12 AM By: Gretta Cool, RN, BSN, Kim RN, BSN Entered By: Gretta Cool, RN, BSN, Kim on 06/21/2015 08:55:41 [place], [name] ([id number]) -------------------------------------------------------------------------------- Activities of Daily Living Details Patient Name: [name], [name]. Date of Service: 06/21/2015 8:45 AM Medical Record Patient Account Number: [id number] Number: Treating RN: Cornell Barman 1950-07-01 (65 y.o. Other Clinician: Date of Birth/Sex: Female) Treating ROBSON, MICHAEL Primary Care Physician/Extender: Wyline Beady, SYED Physician: DEFRANCESCO, Referring Physician: Terance Ice in Treatment: 0 Activities of Daily Living Items Answer Activities of Daily Living (Please select one for each item) Drive Automobile Completely Able Take Medications Completely Able Use Telephone Completely Able Care for Appearance Completely Able Use Toilet Completely Able Bath / Shower Completely Able Dress Self Completely Able Feed Self Completely Able Walk Completely Able Get In / Out Bed Completely Able Housework Completely Able Prepare  Meals Completely [name] for Self Completely Able Electronic Signature(s) Signed: 06/21/2015 11:45:12 AM By: Gretta Cool, RN, BSN, Kim RN, BSN Entered By: Gretta Cool, RN, BSN, Kim on 06/21/2015 08:55:50 [place], [name] ([id number]) -------------------------------------------------------------------------------- Education Assessment Details Patient Name: Jill Gip D. Date of Service: 06/21/2015 8:45 AM Medical Record Patient Account Number: [id number] Number: Treating RN: Cornell Barman 1950-02-12 (65 y.o. Other Clinician: Date of Birth/Sex: Female) Treating ROBSON, MICHAEL Primary Care Physician/Extender: Wyline Beady, SYED Physician: DEFRANCESCO, Referring Physician: Terance Ice in Treatment: 0 Primary Learner Assessed: Patient Learning Preferences/Education Level/Primary Language Learning Preference: Explanation, Demonstration Highest Education Level: College or Above Preferred Language: English Cognitive Barrier Assessment/Beliefs Language Barrier: No Translator Needed: No Memory Deficit: No Emotional Barrier: No Cultural/Religious Beliefs Affecting Medical No Care: Physical Barrier Assessment Impaired Vision: Yes Glasses Impaired Hearing: No Decreased Hand dexterity: No Knowledge/Comprehension Assessment Knowledge Level: High Comprehension Level: High Ability to understand written High instructions: Ability to understand verbal High instructions: Motivation Assessment Anxiety Level: Calm Cooperation: Cooperative Education Importance: Acknowledges Need Interest in Health Problems: Asks Questions Perception: Coherent Willingness to Engage in Self- High Management Activities: [name], [name] ([id number]) Readiness to Engage in Self- High Management Activities: Electronic Signature(s) Signed: 06/21/2015 11:45:12 AM By: Gretta Cool, RN, BSN, Kim RN, BSN Entered By: Gretta Cool, RN, BSN, Kim on 06/21/2015 08:56:28 [name], [name]  ([id number]) -------------------------------------------------------------------------------- Fall Risk Assessment Details Patient Name: Jill Gip D. Date of Service: 06/21/2015 8:45 AM Medical Record Patient Account Number: [id number] Number: Treating RN: Cornell Barman [birth year] (65 y.o. Other Clinician: Date of Birth/Sex: Female) Treating ROBSON, MICHAEL Primary Care Physician/Extender: Wyline Beady, SYED Physician: DEFRANCESCO, Referring Physician: Terance Ice in Treatment: 0 Fall Risk Assessment Items Have you had 2 or more falls in the last 12 monthso 0 No Have you had any fall that resulted in injury in the last 12 monthso 0 No FALL RISK ASSESSMENT: History of  falling - immediate or within 3 months 0 No Secondary diagnosis 0 No Ambulatory aid None/bed rest/wheelchair/nurse 0 Yes Crutches/cane/walker 0 No Furniture 0 No IV Access/Saline Lock 0 No Gait/Training Normal/bed rest/immobile 0 Yes Weak 0 No Impaired 0 No Mental Status Oriented to own ability 0 Yes Electronic Signature(s) Signed: 06/21/2015 11:45:12 AM By: Gretta Cool, RN, BSN, Kim RN, BSN Entered By: Gretta Cool, RN, BSN, Kim on 06/21/2015 08:56:45 [name], [name] ([id number]) -------------------------------------------------------------------------------- Nutrition Risk Assessment Details Patient Name: [name], [name] Date of Service: 06/21/2015 8:45 AM Medical Record Patient Account Number: [id number] Number: Treating RN: Cornell Barman December 05, 1950 (65 y.o. Other Clinician: Date of Birth/Sex: Female) Treating ROBSON, MICHAEL Primary Care Physician/Extender: Wyline Beady, SYED Physician: DEFRANCESCO, Referring Physician: Terance Ice in Treatment: 0 Height (in): 62 Weight (lbs): 175 Body Mass Index (BMI): 32 Nutrition Risk Assessment Items NUTRITION RISK SCREEN: I have an illness or condition that made me change the kind and/or 0 No amount of food I eat I eat fewer than two meals per day 0 No I eat few  fruits and vegetables, or milk products 0 No I have three or more drinks of beer, liquor or wine almost every day 0 No I have tooth or mouth problems that make it hard for me to eat 0 No I don't always have enough money to buy the food I need 0 No I eat alone most of the time 0 No I take three or more different prescribed or over-the-counter drugs a 1 Yes day Without wanting to, I have lost or gained 10 pounds in the last six 0 No months I am not always physically able to shop, cook and/or feed myself 0 No Nutrition Protocols Good Risk Protocol 0 No interventions needed Moderate Risk Protocol Electronic Signature(s) Signed: 06/21/2015 11:45:12 AM By: Gretta Cool, RN, BSN, Kim RN, BSN Entered By: Gretta Cool, RN, BSN, Kim on 06/21/2015 08:57:00

## 2015-06-22 ENCOUNTER — Other Ambulatory Visit
Admission: RE | Admit: 2015-06-22 | Discharge: 2015-06-22 | Disposition: A | Discharge status: Home / Self Care | Payer: PPO | Source: Ambulatory Visit | Attending: Internal Medicine | Admitting: Internal Medicine

## 2015-06-22 DIAGNOSIS — S31809A Unspecified open wound of unspecified buttock, initial encounter: Secondary | ICD-10-CM | POA: Insufficient documentation

## 2015-06-22 DIAGNOSIS — X58XXXA Exposure to other specified factors, initial encounter: Secondary | ICD-10-CM | POA: Diagnosis not present

## 2015-06-22 NOTE — Progress Notes (Signed)
[name], [name] ([id number]) Visit Report for 06/21/2015 Allergy List Details Patient Name: [name], [name]. Date of Service: 06/21/2015 8:45 AM Medical Record Patient Account Number: [id number] Number: Treating RN: Cornell Barman [birth year] (65 y.o. Other Clinician: Date of Birth/Sex: Female) Treating ROBSON, MICHAEL Primary Care Physician/Extender: Wyline Beady, SYED Physician: DEFRANCESCO, Referring Physician: Terance Ice in Treatment: 0 Allergies Active Allergies Cipro oxycodone Allergy Notes Electronic Signature(s) Signed: 06/21/2015 11:45:12 AM By: Gretta Cool, RN, BSN, Kim RN, BSN Entered By: Gretta Cool, RN, BSN, Kim on 06/21/2015 08:49:21 Kesling, Verneda Skill ([id number]) -------------------------------------------------------------------------------- Arrival Information Details Patient Name: [name], [name] Date of Service: 06/21/2015 8:45 AM Medical Record Patient Account Number: [id number] Number: Treating RN: Cornell Barman March 11, 1950 (65 y.o. Other Clinician: Date of Birth/Sex: Female) Treating ROBSON, MICHAEL Primary Care Physician/Extender: Wyline Beady, SYED Physician: DEFRANCESCO, Referring Physician: Terance Ice in Treatment: 0 Visit Information Patient Arrived: Ambulatory Arrival Time: 08:46 Accompanied By: self Transfer Assistance: None Patient Identification Verified: Yes Secondary Verification Process Yes Completed: Electronic Signature(s) Signed: 06/21/2015 11:45:12 AM By: Gretta Cool, RN, BSN, Kim RN, BSN Entered By: Gretta Cool, RN, BSN, Kim on 06/21/2015 08:46:51 Fielder, Verneda Skill ([id number]) -------------------------------------------------------------------------------- Clinic Level of Care Assessment Details Patient Name: [name]. Date of Service: 06/21/2015 8:45 AM Medical Record Patient Account Number: [id number] Number: Treating RN: Cornell Barman 07-Sep-1950 (65 y.o. Other Clinician: Date of Birth/Sex: Female) Treating ROBSON, MICHAEL Primary Care  Physician/Extender: Wyline Beady, SYED Physician: DEFRANCESCO, Referring Physician: Terance Ice in Treatment: 0 Clinic Level of Care Assessment Items TOOL 2 Quantity Score []  - Use when only an EandM is performed on the INITIAL visit 0 ASSESSMENTS - Nursing Assessment / Reassessment X - General Physical Exam (combine w/ comprehensive assessment (listed just 1 20 below) when performed on new pt. evals) X - Comprehensive Assessment (HX, ROS, Risk Assessments, Wounds Hx, etc.) 1 25 ASSESSMENTS - Wound and Skin Assessment / Reassessment X - Simple Wound Assessment / Reassessment - one wound 1 5 []  - Complex Wound Assessment / Reassessment - multiple wounds 0 []  - Dermatologic / Skin Assessment (not related to wound area) 0 ASSESSMENTS - Ostomy and/or Continence Assessment and Care []  - Incontinence Assessment and Management 0 []  - Ostomy Care Assessment and Management (repouching, etc.) 0 PROCESS - Coordination of Care X - Simple Patient / Family Education for ongoing care 1 15 []  - Complex (extensive) Patient / Family Education for ongoing care 0 []  - Staff obtains Programmer, systems, Records, Test Results / Process Orders 0 []  - Staff telephones HHA, Nursing Homes / Clarify orders / etc 0 []  - Routine Transfer to another Facility (non-emergent condition) 0 []  - Routine Hospital Admission (non-emergent condition) 0 X - New Admissions / Biomedical engineer / Ordering NPWT, Apligraf, etc. 1 15 Duris, [name] ([id number]) []  - Emergency Hospital Admission (emergent condition) 0 X - Simple Discharge Coordination 1 10 []  - Complex (extensive) Discharge Coordination 0 PROCESS - Special Needs []  - Pediatric / Minor Patient Management 0 []  - Isolation Patient Management 0 []  - Hearing / Language / Visual special needs 0 []  - Assessment of Community assistance (transportation, D/C planning, etc.) 0 []  - Additional assistance / Altered mentation 0 []  - Support Surface(s) Assessment (bed, cushion,  seat, etc.) 0 INTERVENTIONS - Wound Cleansing / Measurement X - Wound Imaging (photographs - any number of wounds) 1 5 []  - Wound Tracing (instead of photographs) 0 X - Simple Wound Measurement - one wound 1 5 []  - Complex Wound Measurement - multiple wounds  0 X - Simple Wound Cleansing - one wound 1 5 []  - Complex Wound Cleansing - multiple wounds 0 INTERVENTIONS - Wound Dressings X - Small Wound Dressing one or multiple wounds 1 10 []  - Medium Wound Dressing one or multiple wounds 0 []  - Large Wound Dressing one or multiple wounds 0 []  - Application of Medications - injection 0 INTERVENTIONS - Miscellaneous []  - External ear exam 0 []  - Specimen Collection (cultures, biopsies, blood, body fluids, etc.) 0 []  - Specimen(s) / Culture(s) sent or taken to Lab for analysis 0 []  - Patient Transfer (multiple staff / Civil Service fast streamer / Similar devices) 0 Stann, Kessie D. ([id number]) []  - Simple Staple / Suture removal (25 or less) 0 []  - Complex Staple / Suture removal (26 or more) 0 []  - Hypo / Hyperglycemic Management (close monitor of Blood Glucose) 0 []  - Ankle / Brachial Index (ABI) - do not check if billed separately 0 Has the patient been seen at the hospital within the last three years: Yes Total Score: 115 Level Of Care: New/Established - Level 3 Electronic Signature(s) Signed: 06/21/2015 11:45:12 AM By: Gretta Cool, RN, BSN, Kim RN, BSN Entered By: Gretta Cool, RN, BSN, Kim on 06/21/2015 09:19:28 Jamal Maes ([id number]) -------------------------------------------------------------------------------- Encounter Discharge Information Details Patient Name: TANAJA, [name] Date of Service: 06/21/2015 8:45 AM Medical Record Patient Account Number: [id number] Number: Treating RN: Cornell Barman 08-26-1950 (65 y.o. Other Clinician: Date of Birth/Sex: Female) Treating ROBSON, MICHAEL Primary Care Physician/Extender: Wyline Beady, SYED Physician: DEFRANCESCO, Referring Physician: Terance Ice in  Treatment: 0 Encounter Discharge Information Items Discharge Pain Level: 0 Discharge Condition: Stable Ambulatory Status: Ambulatory Discharge Destination: Home Transportation: Private Auto Accompanied By: self Schedule Follow-up Appointment: Yes Medication Reconciliation completed and provided to Patient/Care No Alleya Demeter: Provided on Clinical Summary of Care: 06/21/2015 Form Type Recipient Paper Patient [facility] Electronic Signature(s) Signed: 06/21/2015 9:26:34 AM By: Ruthine Dose Entered By: Ruthine Dose on 06/21/2015 09:26:33 Asano, Kylen D. ([id number]) -------------------------------------------------------------------------------- Multi Wound Chart Details Patient Name: [name] Date of Service: 06/21/2015 8:45 AM Medical Record Patient Account Number: [id number] Number: Treating RN: Cornell Barman 10-02-1950 (65 y.o. Other Clinician: Date of Birth/Sex: Female) Treating ROBSON, MICHAEL Primary Care Physician/Extender: Wyline Beady, SYED Physician: DEFRANCESCO, Referring Physician: Terance Ice in Treatment: 0 Vital Signs Height(in): 62 Pulse(bpm): 101 Weight(lbs): 175 Blood Pressure 136/66 (mmHg): Body Mass Index(BMI): 32 Temperature(F): 97.5 Respiratory Rate 18 (breaths/min): Photos: [1:No Photos] [N/A:N/A] Wound Location: [1:Right Gluteus] [N/A:N/A] Wounding Event: [1:Pressure Injury] [N/A:N/A] Primary Etiology: [1:Pressure Ulcer] [N/A:N/A] Comorbid History: [1:Cataracts, Hypertension, Type II Diabetes, Osteoarthritis] [N/A:N/A] Date Acquired: [1:04/21/2015] [N/A:N/A] Weeks of Treatment: [1:0] [N/A:N/A] Wound Status: [1:Open] [N/A:N/A] Measurements L x W x D 0.7x0.5x0.1 [N/A:N/A] (cm) Area (cm) : [1:0.275] [N/A:N/A] Volume (cm) : [1:0.027] [N/A:N/A] Classification: [1:Category/Stage II] [N/A:N/A] Exudate Amount: [1:Medium] [N/A:N/A] Exudate Type: [1:Serous] [N/A:N/A] Exudate Color: [1:amber] [N/A:N/A] Wound Margin: [1:Flat and Intact]  [N/A:N/A] Granulation Amount: [1:Small (1-33%)] [N/A:N/A] Granulation Quality: [1:Pink] [N/A:N/A] Necrotic Amount: [1:Small (1-33%)] [N/A:N/A] Epithelialization: [1:Small (1-33%)] [N/A:N/A] Periwound Skin Texture: Edema: No [1:Excoriation: No Induration: No] [N/A:N/A] Callus: No Crepitus: No Fluctuance: No Friable: No Rash: No Scarring: No Periwound Skin Maceration: No N/A N/A Moisture: Moist: No Dry/Scaly: No Periwound Skin Color: Atrophie Blanche: No N/A N/A Cyanosis: No Ecchymosis: No Erythema: No Hemosiderin Staining: No Mottled: No Pallor: No Rubor: No Tenderness on No N/A N/A Palpation: Wound Preparation: Ulcer Cleansing: N/A N/A Rinsed/Irrigated with Saline Topical Anesthetic Applied: Other: lidocaine 4% Treatment Notes Electronic Signature(s) Signed: 06/21/2015 11:45:12 AM  By: Gretta Cool, RN, BSN, Kim RN, BSN Entered By: Gretta Cool, RN, BSN, Kim on 06/21/2015 09:03:42 Jamal Maes ([id number]) -------------------------------------------------------------------------------- Multi-Disciplinary Care Plan Details Patient Name: KALIANNE, DAVIDSON. Date of Service: 06/21/2015 8:45 AM Medical Record Patient Account Number: [id number] Number: Treating RN: Cornell Barman [birth year] (65 y.o. Other Clinician: Date of Birth/Sex: Female) Treating ROBSON, MICHAEL Primary Care Physician/Extender: Wyline Beady, SYED Physician: DEFRANCESCO, Referring Physician: Terance Ice in Treatment: 0 Active Inactive Nutrition Nursing Diagnoses: Imbalanced nutrition Goals: Patient/caregiver will maintain therapeutic glucose control Date Initiated: 06/21/2015 Goal Status: Active Interventions: Provide education on elevated blood sugars and impact on wound healing Notes: Orientation to the Wound Care Program Nursing Diagnoses: Knowledge deficit related to the wound healing center program Goals: Patient/caregiver will verbalize understanding of the [facility] Date  Initiated: 06/21/2015 Goal Status: Active Interventions: Provide education on orientation to the wound center Notes: Pressure Nursing Diagnoses: Knowledge deficit related to management of pressures ulcers Leabo, Madiline D. ([id number]) Goals: Patient will remain free of pressure ulcers Date Initiated: 06/21/2015 Goal Status: Active Interventions: Provide education on pressure ulcers Notes: Wound/Skin Impairment Nursing Diagnoses: Impaired tissue integrity Goals: Ulcer/skin breakdown will heal within 14 weeks Date Initiated: 06/21/2015 Goal Status: Active Interventions: Assess patient/caregiver ability to obtain necessary supplies Notes: Electronic Signature(s) Signed: 06/21/2015 11:45:12 AM By: Gretta Cool, RN, BSN, Kim RN, BSN Entered By: Gretta Cool, RN, BSN, Kim on 06/21/2015 09:03:31 Rowley, Jhordyn DMarland Kitchen ([id number]) -------------------------------------------------------------------------------- Pain Assessment Details Patient Name: [name] Date of Service: 06/21/2015 8:45 AM Medical Record Patient Account Number: [id number] Number: Treating RN: Cornell Barman [birth year] (65 y.o. Other Clinician: Date of Birth/Sex: Female) Treating ROBSON, MICHAEL Primary Care Physician/Extender: Wyline Beady, SYED Physician: DEFRANCESCO, Referring Physician: Terance Ice in Treatment: 0 Active Problems Location of Pain Severity and Description of Pain Patient Has Paino No Site Locations Duration of the Pain. Constant / Intermittento Constant Rate the pain. Current Pain Level: Insensate Worst Pain Level: 10 Least Pain Level: 10 Character of Pain Describe the Pain: Heavy, Sharp, Shooting, Tender Pain Management and Medication Current Pain Management: Medication: Yes Is the Current Pain Management Adequate Adequate: Electronic Signature(s) Signed: 06/21/2015 11:45:12 AM By: Gretta Cool, RN, BSN, Kim RN, BSN Entered By: Gretta Cool, RN, BSN, Kim on 06/21/2015 08:47:43 Kassa, Verneda Skill  ([id number]) -------------------------------------------------------------------------------- Patient/Caregiver Education Details Patient Name: DENICA, KAPLON. Date of Service: 06/21/2015 8:45 AM Medical Record Patient Account Number: [id number] Number: Treating RN: Cornell Barman 1950-06-02 (65 y.o. Other Clinician: Date of Birth/Gender: Female) Treating ROBSON, MICHAEL Primary Care Physician: [facility], SYED Physician/Extender: G DEFRANCESCO, Weeks in Treatment: 0 Referring Physician: MARTIN Education Assessment Education Provided To: Patient Education Topics Provided Wound/Skin Impairment: Handouts: Caring for Your Ulcer, Other: wound care as prescribed Methods: Demonstration Responses: State content correctly Electronic Signature(s) Signed: 06/21/2015 11:45:12 AM By: Gretta Cool, RN, BSN, Kim RN, BSN Entered By: Gretta Cool, RN, BSN, Kim on 06/21/2015 09:20:57 Spooner, Verneda Skill ([id number]) -------------------------------------------------------------------------------- Wound Assessment Details Patient Name: SENECA, MOBBS. Date of Service: 06/21/2015 8:45 AM Medical Record Patient Account Number: [id number] Number: Treating RN: Cornell Barman 05-29-1950 (65 y.o. Other Clinician: Date of Birth/Sex: Female) Treating ROBSON, MICHAEL Primary Care Physician/Extender: Wyline Beady, SYED Physician: DEFRANCESCO, Referring Physician: Terance Ice in Treatment: 0 Wound Status Wound Number: 1 Primary Pressure Ulcer Etiology: Wound Location: Right Gluteus Wound Open Wounding Event: Pressure Injury Status: Date Acquired: 04/21/2015 Comorbid Cataracts, Hypertension, Type II Weeks Of Treatment: 0 History: Diabetes, Osteoarthritis Clustered Wound: No Photos Photo Uploaded By: Gretta Cool, RN, BSN, Kim on 06/21/2015  09:39:19 Wound Measurements Length: (cm) 0.7 % Reduction in Ar Width: (cm) 0.5 % Reduction in Vo Depth: (cm) 0.1 Epithelialization Area: (cm) 0.275 Tunneling: Volume: (cm)  0.027 Undermining: ea: lume: : Small (1-33%) No No Wound Description Classification: Category/Stage II Wound Margin: Flat and Intact Exudate Amount: Medium Exudate Type: Serous Exudate Color: amber Wound Bed Granulation Amount: Small (1-33%) Hakim, Yareni D. ([id number]) Granulation Quality: Pink Necrotic Amount: Small (1-33%) Necrotic Quality: Adherent Slough Periwound Skin Texture Texture Color No Abnormalities Noted: No No Abnormalities Noted: No Callus: No Atrophie Blanche: No Crepitus: No Cyanosis: No Excoriation: No Ecchymosis: No Fluctuance: No Erythema: No Friable: No Hemosiderin Staining: No Induration: No Mottled: No Localized Edema: No Pallor: No Rash: No Rubor: No Scarring: No Moisture No Abnormalities Noted: No Dry / Scaly: No Maceration: No Moist: No Wound Preparation Ulcer Cleansing: Rinsed/Irrigated with Saline Topical Anesthetic Applied: Other: lidocaine 4%, Treatment Notes Wound #1 (Right Gluteus) 1. Cleansed with: Clean wound with Normal Saline 2. Anesthetic Topical Lidocaine 4% cream to wound bed prior to debridement 3. Peri-wound Care: Skin Prep 4. Dressing Applied: Aquacel Ag 5. Secondary Dressing Applied Bordered Foam Dressing Electronic Signature(s) Signed: 06/21/2015 11:45:12 AM By: Gretta Cool, RN, BSN, Kim RN, BSN Entered By: Gretta Cool, RN, BSN, Kim on 06/21/2015 09:01:31 JENETTE, DONALSON ([id number]) -------------------------------------------------------------------------------- Vitals Details Patient Name: [name] Date of Service: 06/21/2015 8:45 AM Medical Record Patient Account Number: [id number] Number: Treating RN: Cornell Barman September 02, 1950 (65 y.o. Other Clinician: Date of Birth/Sex: Female) Treating ROBSON, MICHAEL Primary Care Physician/Extender: Wyline Beady, SYED Physician: DEFRANCESCO, Referring Physician: Terance Ice in Treatment: 0 Vital Signs Time Taken: 08:47 Temperature (F): 97.5 Height (in):  62 Pulse (bpm): 101 Weight (lbs): 175 Respiratory Rate (breaths/min): 18 Body Mass Index (BMI): 32 Blood Pressure (mmHg): 136/66 Reference Range: 80 - 120 mg / dl Electronic Signature(s) Signed: 06/21/2015 11:45:12 AM By: Gretta Cool, RN, BSN, Kim RN, BSN Entered By: Gretta Cool, RN, BSN, Kim on 06/21/2015 08:48:11

## 2015-06-22 NOTE — Progress Notes (Signed)
TIFFANE, STRENGER ([id number]) Visit Report for 06/21/2015 Chief Complaint Document Details Patient Name: [name], [name]. Date of Service: 06/21/2015 8:45 AM Medical Record Patient Account Number: [id number] Number: Treating RN: Cornell Barman 1950-07-22 (65 y.o. Other Clinician: Date of Birth/Sex: Female) Treating ROBSON, MICHAEL Primary Care Physician/Extender: Wyline Beady, SYED Physician: DEFRANCESCO, Referring Physician: Terance Ice in Treatment: 0 Information Obtained from: Patient Chief Complaint Patient arrives for review of an advertised pressure ulcer on her right buttock Electronic Signature(s) Signed: 06/22/2015 7:56:10 AM By: Linton Ham MD Entered By: Linton Ham on 06/21/2015 09:32:16 [name], [name] Kitchen ([id number]) -------------------------------------------------------------------------------- Debridement Details Patient Name: Jill Gip D. Date of Service: 06/21/2015 8:45 AM Medical Record Patient Account Number: [id number] Number: Treating RN: Cornell Barman 1950/07/28 (65 y.o. Other Clinician: Date of Birth/Sex: Female) Treating ROBSON, MICHAEL Primary Care Physician/Extender: Wyline Beady, SYED Physician: DEFRANCESCO, Referring Physician: Terance Ice in Treatment: 0 Debridement Performed for Wound #1 Right Gluteus Assessment: Performed By: Physician Ricard Dillon, MD Debridement: Open Wound/Selective Debridement Selective Description: Pre-procedure Yes Verification/Time Out Taken: Start Time: 09:15 Pain Control: Other : lidocaine 4% Level: Non-Viable Tissue Total Area Debrided (L x 0.7 (cm) x 0.5 (cm) = 0.35 (cm) W): Tissue and other Viable, Non-Viable, Exudate, Fibrin/Slough, Subcutaneous material debrided: Instrument: Curette Bleeding: Minimum Hemostasis Achieved: Pressure End Time: 09:17 Procedural Pain: 0 Post Procedural Pain: 0 Response to Treatment: Procedure was tolerated well Post Debridement Measurements of Total  Wound Length: (cm) 0.7 Stage: Category/Stage II Width: (cm) 0.5 Depth: (cm) 0.1 Volume: (cm) 0.027 Post Procedure Diagnosis Same as Pre-procedure Electronic Signature(s) Signed: 06/21/2015 11:45:12 AM By: Gretta Cool, RN, BSN, Kim RN, BSN [name], [name] ([id number]) Signed: 06/22/2015 7:56:10 AM By: Linton Ham MD Entered By: Linton Ham on 06/21/2015 09:31:26 [name] Kitchen ([id number]) -------------------------------------------------------------------------------- HPI Details Patient Name: [name], [name]. Date of Service: 06/21/2015 8:45 AM Medical Record Patient Account Number: [id number] Number: Treating RN: Cornell Barman November 26, 1950 (65 y.o. Other Clinician: Date of Birth/Sex: Female) Treating ROBSON, MICHAEL Primary Care Physician/Extender: Wyline Beady, SYED Physician: DEFRANCESCO, Referring Physician: Terance Ice in Treatment: 0 History of Present Illness HPI Description: 06/21/15; this is a 65 year old woman who arrives with a 2 to three-month history of what his average size this is stage II pressure ulcer on her right buttock. She is referred from her her OB/GYN who is been following her closely for Lichen sclerosis. She is been applying steroid cream to the wound on her buttock which is what is been prescribed for her chronic vulvar issues. Looking back through [facility] I can see reference of this to March 9 again at her gynecologist where she was listed as having an "boil of her buttock". She does not have a prior history of skin abscesses. She does have a history of psoriasis although this is been well controlled over the last year with Cosentyx. In terms of the risk factors for pressure ulcers I really can't pick these up for. She is ambulatory. Does not give an easy history of being immobilized for a prolonged period of time/hard surfaces etc. she lives alone and seems to be reasonably independent. She had a recently elevated hemoglobin A1c of 6 but is not a  known diabetic Electronic Signature(s) Signed: 06/22/2015 7:56:10 AM By: Linton Ham MD Entered By: Linton Ham on 06/21/2015 09:36:07 [name] ([id number]) -------------------------------------------------------------------------------- Physical Exam Details Patient Name: [name], [name] Date of Service: 06/21/2015 8:45 AM Medical Record Patient Account Number: [id number] Number: Treating RN: Cornell Barman February 07, 1950 (  65 y.o. Other Clinician: Date of Birth/Sex: Female) Treating ROBSON, MICHAEL Primary Care Physician/Extender: Wyline Beady, SYED Physician: DEFRANCESCO, Referring Physician: Terance Ice in Treatment: 0 Constitutional Sitting or standing Blood Pressure is within target range for patient.. Pulse regular and within target range for patient.Marland Kitchen Respirations regular, non-labored and within target range.. Temperature is normal and within the target range for the patient.. Patient's appearance is neat and clean. Appears in no acute distress. Well nourished and well developed.. Eyes Conjunctivae clear. No discharge. No scleral icterus. Respiratory Respiratory effort is easy and symmetric bilaterally. Rate is normal at rest and on room air.. Bilateral breath sounds are clear and equal in all lobes with no wheezes, rales or rhonchi.. Cardiovascular Heart rhythm and rate regular, without murmur or gallop.. Gastrointestinal (GI) Abdomen is soft and non-distended without masses or tenderness. Bowel sounds active in all quadrants.. No liver or spleen enlargement or tenderness.. Musculoskeletal I watched her ambulate into the clinic. She seems to walk quite well. Integumentary (Hair, Skin) No signs of a systemic skin problem or psoriasis. Psychiatric No evidence of depression, anxiety, or agitation. Calm, cooperative, and communicative. Appropriate interactions and affect.. Notes Wound exam; the areas on her right buttock small superficial area with a minimal  amount of surrounding erythema but no subcutaneous involvement particularly no evidence of an abscess. This was exquisitely painful almost surprisingly so. In any case there was no evidence of any depth or subcutaneous involvement here. Electronic Signature(s) Signed: 06/22/2015 7:56:10 AM By: Linton Ham MD Entered By: Linton Ham on 06/21/2015 09:38:55 [name], [name] ([id number]) Helene Kelp, [name] ([id number]) -------------------------------------------------------------------------------- Physician Orders Details Patient Name: [name], [name] Date of Service: 06/21/2015 8:45 AM Medical Record Patient Account Number: [id number] Number: Treating RN: Cornell Barman 10-Jul-1950 (65 y.o. Other Clinician: Date of Birth/Sex: Female) Treating ROBSON, MICHAEL Primary Care Physician/Extender: Wyline Beady, SYED Physician: DEFRANCESCO, Referring Physician: Terance Ice in Treatment: 0 Verbal / Phone Orders: Yes Clinician: Cornell Barman Read Back and Verified: Yes Diagnosis Coding ICD-10 Coding Code Description L89.312 Pressure ulcer of right buttock, stage 2 Wound Cleansing Wound #1 Right Gluteus o Clean wound with Normal Saline. Anesthetic Wound #1 Right Gluteus o Topical Lidocaine 4% cream applied to wound bed prior to debridement Skin Barriers/Peri-Wound Care Wound #1 Right Gluteus o Barrier cream Primary Wound Dressing Wound #1 Right Gluteus o Aquacel Ag Secondary Dressing Wound #1 Right Gluteus o Boardered Foam Dressing Dressing Change Frequency Wound #1 Right Gluteus o Three times weekly Follow-up Appointments Wound #1 Right Gluteus o Return Appointment in 1 week. [name], [name] ([id number]) Additional Orders / Instructions Wound #1 Right Gluteus o Activity as tolerated o Other: - switch positions while sitting Laboratory o Bacteria identified in Wound by Culture (MICRO) - right buttock oooo LOINC Code: [id number] oooo Convenience Name: Wound culture  routine Electronic Signature(s) Signed: 06/21/2015 11:45:12 AM By: Gretta Cool, RN, BSN, Kim RN, BSN Signed: 06/22/2015 7:56:10 AM By: Linton Ham MD Entered By: Gretta Cool, RN, BSN, Kim on 06/21/2015 09:18:46 Depass, [name] ([id number]) -------------------------------------------------------------------------------- Problem List Details Patient Name: BRIYAH, EDMINSTER. Date of Service: 06/21/2015 8:45 AM Medical Record Patient Account Number: [id number] Number: Treating RN: Cornell Barman [birth year] (65 y.o. Other Clinician: Date of Birth/Sex: Female) Treating ROBSON, MICHAEL Primary Care Physician/Extender: Wyline Beady, SYED Physician: DEFRANCESCO, Referring Physician: Terance Ice in Treatment: 0 Active Problems ICD-10 Encounter Code Description Active Date Diagnosis L89.312 Pressure ulcer of right buttock, stage 2 06/21/2015 Yes Inactive Problems Resolved Problems Electronic Signature(s) Signed: 06/22/2015 7:56:10 AM By:  Linton Ham MD Entered By: Linton Ham on 06/21/2015 09:31:13 [name], [name] ([id number]) -------------------------------------------------------------------------------- Progress Note Details Patient Name: [name], MULTER. Date of Service: 06/21/2015 8:45 AM Medical Record Patient Account Number: [id number] Number: Treating RN: Cornell Barman 1950-03-09 (65 y.o. Other Clinician: Date of Birth/Sex: Female) Treating ROBSON, MICHAEL Primary Care Physician/Extender: Wyline Beady, SYED Physician: DEFRANCESCO, Referring Physician: Terance Ice in Treatment: 0 Subjective Chief Complaint Information obtained from Patient Patient arrives for review of an advertised pressure ulcer on her right buttock History of Present Illness (HPI) 06/21/15; this is a 65 year old woman who arrives with a 2 to three-month history of what his average size this is stage II pressure ulcer on her right buttock. She is referred from her her OB/GYN who is been following her closely for  Lichen sclerosis. She is been applying steroid cream to the wound on her buttock which is what is been prescribed for her chronic vulvar issues. Looking back through [facility] I can see reference of this to March 9 again at her gynecologist where she was listed as having an "boil of her buttock". She does not have a prior history of skin abscesses. She does have a history of psoriasis although this is been well controlled over the last year with Cosentyx. In terms of the risk factors for pressure ulcers I really can't pick these up for. She is ambulatory. Does not give an easy history of being immobilized for a prolonged period of time/hard surfaces etc. she lives alone and seems to be reasonably independent. She had a recently elevated hemoglobin A1c of 6 but is not a known diabetic Wound History Patient presents with 1 open wound that has been present for approximately 2 mos. Patient has been treating wound in the following manner: pressure relief, clobetasol. Laboratory tests have been performed in the last month. Patient reportedly has tested positive for an antibiotic resistant organism. Patient reportedly has not tested positive for osteomyelitis. Patient reportedly has not had testing performed to evaluate circulation in the legs. Patient History Information obtained from Patient. Allergies Cipro, oxycodone Family History Cancer - Mother, Diabetes - Paternal Grandparents, Heart Disease - Mother, Kidney Disease - Siblings, Lung Disease - Father, Stroke - Mother, Father, [name], DEININGER. ([id number]) No family history of Hypertension, Seizures, Thyroid Problems. Social History Current every day smoker - 1 pack daily, Marital Status - Widowed, Alcohol Use - Never, Drug Use - No History, Caffeine Use - Daily. Medical History Eyes Patient has history of Cataracts - removed Denies history of Glaucoma, Optic Neuritis Ear/Nose/Mouth/Throat Denies history of Chronic sinus  problems/congestion, Middle ear problems Hematologic/Lymphatic Denies history of Anemia, Hemophilia, Human Immunodeficiency Virus, Lymphedema, Sickle Cell Disease Respiratory Denies history of Aspiration, Asthma, Chronic Obstructive Pulmonary Disease (COPD), Pneumothorax, Sleep Apnea, Tuberculosis Cardiovascular Patient has history of Hypertension - medication Denies history of Angina, Arrhythmia, Congestive Heart Failure, Coronary Artery Disease, Deep Vein Thrombosis, Hypotension, Myocardial Infarction, Peripheral Arterial Disease, Peripheral Venous Disease, Phlebitis, Vasculitis Gastrointestinal Denies history of Cirrhosis , Colitis, Crohn s, Hepatitis A, Hepatitis B, Hepatitis C Endocrine Patient has history of Type II Diabetes - Borderline Denies history of Type I Diabetes Genitourinary Denies history of End Stage Renal Disease Immunological Denies history of Lupus Erythematosus, Raynaud s, Scleroderma Integumentary (Skin) Denies history of History of Burn, History of pressure wounds Musculoskeletal Patient has history of Osteoarthritis Denies history of Gout, Rheumatoid Arthritis, Osteomyelitis Neurologic Denies history of Dementia, Neuropathy, Quadriplegia, Paraplegia, Seizure Disorder Oncologic Denies history of Received Chemotherapy, Received Radiation  Psychiatric Denies history of Anorexia/bulimia, Confinement Anxiety Medical And Surgical History Notes Constitutional Symptoms (General Health) Thyroid, HTN; Psoriasis, arthritis Review of Systems (ROS) Constitutional Symptoms (General Health) Complains or has symptoms of Chills. Denies complaints or symptoms of Fatigue, Fever, Marked Weight Change. [name], [name] ([id number]) Eyes The patient has no complaints or symptoms. Ear/Nose/Mouth/Throat The patient has no complaints or symptoms. Hematologic/Lymphatic The patient has no complaints or symptoms. Respiratory The patient has no complaints or  symptoms. Cardiovascular The patient has no complaints or symptoms. Gastrointestinal The patient has no complaints or symptoms. Endocrine The patient has no complaints or symptoms. Genitourinary The patient has no complaints or symptoms. Immunological The patient has no complaints or symptoms. Integumentary (Skin) Complains or has symptoms of Wounds. Denies complaints or symptoms of Bleeding or bruising tendency, Breakdown, Swelling. Musculoskeletal The patient has no complaints or symptoms. Neurologic The patient has no complaints or symptoms. Oncologic The patient has no complaints or symptoms. Psychiatric The patient has no complaints or symptoms. Objective Constitutional Sitting or standing Blood Pressure is within target range for patient.. Pulse regular and within target range for patient.Marland Kitchen Respirations regular, non-labored and within target range.. Temperature is normal and within the target range for the patient.. Patient's appearance is neat and clean. Appears in no acute distress. Well nourished and well developed.. Vitals Time Taken: 8:47 AM, Height: 62 in, Weight: 175 lbs, BMI: 32, Temperature: 97.5 F, Pulse: 101 bpm, Respiratory Rate: 18 breaths/min, Blood Pressure: 136/66 mmHg. Eyes Conjunctivae clear. No discharge. No scleral icterus. AIYLA, PERDUE ([id number]) Respiratory Respiratory effort is easy and symmetric bilaterally. Rate is normal at rest and on room air.. Bilateral breath sounds are clear and equal in all lobes with no wheezes, rales or rhonchi.. Cardiovascular Heart rhythm and rate regular, without murmur or gallop.. Gastrointestinal (GI) Abdomen is soft and non-distended without masses or tenderness. Bowel sounds active in all quadrants.. No liver or spleen enlargement or tenderness.. Musculoskeletal I watched her ambulate into the clinic. She seems to walk quite well. Psychiatric No evidence of depression, anxiety, or agitation. Calm,  cooperative, and communicative. Appropriate interactions and affect.. General Notes: Wound exam; the areas on her right buttock small superficial area with a minimal amount of surrounding erythema but no subcutaneous involvement particularly no evidence of an abscess. This was exquisitely painful almost surprisingly so. In any case there was no evidence of any depth or subcutaneous involvement here. Integumentary (Hair, Skin) No signs of a systemic skin problem or psoriasis. Wound #1 status is Open. Original cause of wound was Pressure Injury. The wound is located on the Right Gluteus. The wound measures 0.7cm length x 0.5cm width x 0.1cm depth; 0.275cm^2 area and 0.027cm^3 volume. There is no tunneling or undermining noted. There is a medium amount of serous drainage noted. The wound margin is flat and intact. There is small (1-33%) pink granulation within the wound bed. There is a small (1-33%) amount of necrotic tissue within the wound bed including Adherent Slough. The periwound skin appearance did not exhibit: Callus, Crepitus, Excoriation, Fluctuance, Friable, Induration, Localized Edema, Rash, Scarring, Dry/Scaly, Maceration, Moist, Atrophie Blanche, Cyanosis, Ecchymosis, Hemosiderin Staining, Mottled, Pallor, Rubor, Erythema. Assessment Active Problems ICD-10 L89.312 - Pressure ulcer of right buttock, stage 2 [name], [name] ([id number]) Procedures Wound #1 Wound #1 is a Pressure Ulcer located on the Right Gluteus . There was a Non-Viable Tissue Open Wound/Selective [phone]) debridement with total area of 0.35 sq cm performed by Ricard Dillon, MD. with the following instrument(s):  Curette to remove Viable and Non-Viable tissue/material including Exudate, Fibrin/Slough, and Subcutaneous after achieving pain control using Other (lidocaine 4%). A time out was conducted prior to the start of the procedure. A Minimum amount of bleeding was controlled with Pressure. The procedure  was tolerated well with a pain level of 0 throughout and a pain level of 0 following the procedure. Post Debridement Measurements: 0.7cm length x 0.5cm width x 0.1cm depth; 0.027cm^3 volume. Post debridement Stage noted as Category/Stage II. Post procedure Diagnosis Wound #1: Same as Pre-Procedure Plan Wound Cleansing: Wound #1 Right Gluteus: Clean wound with Normal Saline. Anesthetic: Wound #1 Right Gluteus: Topical Lidocaine 4% cream applied to wound bed prior to debridement Skin Barriers/Peri-Wound Care: Wound #1 Right Gluteus: Barrier cream Primary Wound Dressing: Wound #1 Right Gluteus: Aquacel Ag Secondary Dressing: Wound #1 Right Gluteus: Boardered Foam Dressing Dressing Change Frequency: Wound #1 Right Gluteus: Three times weekly Follow-up Appointments: Wound #1 Right Gluteus: Return Appointment in 1 week. Additional Orders / Instructions: Wound #1 Right Gluteus: Activity as tolerated Other: - switch positions while sitting Laboratory ordered were: Wound culture routine - right buttock [name], Karlei D. ([id number]) #1 I wonder if this started more as a staph skin infection rather than a pressure area. Her original gynecologist notes makes reference to 1 "boil" I have cultured this area but did not provide empiric antibiotics dresser was silver alginate which should provide some antibacterial properties. She'll cover this with a foam change every second day I have already advised keeping her off this however she finds this too uncomfortable to really sit on. I have advised her to keep her hands particularly her fingernails off this area. She is probably going to require some family assistance to do these dressings, that didn't seem to be a problem. There was some superficial denuded skin medially I removed this there was no additional wound Electronic Signature(s) Signed: 06/22/2015 7:56:10 AM By: Linton Ham MD Entered By: Linton Ham on 06/21/2015  09:40:46 Amster, [name] ([id number]) -------------------------------------------------------------------------------- ROS/PFSH Details Patient Name: Jill Gip D. Date of Service: 06/21/2015 8:45 AM Medical Record Patient Account Number: [id number] Number: Treating RN: Cornell Barman 1950-11-25 (65 y.o. Other Clinician: Date of Birth/Sex: Female) Treating ROBSON, MICHAEL Primary Care Physician/Extender: Wyline Beady, SYED Physician: DEFRANCESCO, Referring Physician: Terance Ice in Treatment: 0 Information Obtained From Patient Wound History Do you currently have one or more open woundso Yes How many open wounds do you currently haveo 1 Approximately how long have you had your woundso 2 mos How have you been treating your wound(s) until nowo pressure relief, clobetasol Has your wound(s) ever healed and then re-openedo No Have you had any lab work done in the past montho Yes Have you tested positive for osteomyelitis (bone infection)o No Have you had any tests for circulation on your legso No Constitutional Symptoms (General Health) Complaints and Symptoms: Positive for: Chills Negative for: Fatigue; Fever; Marked Weight Change Medical History: Past Medical History Notes: Thyroid, HTN; Psoriasis, arthritis Integumentary (Skin) Complaints and Symptoms: Positive for: Wounds Negative for: Bleeding or bruising tendency; Breakdown; Swelling Medical History: Negative for: History of Burn; History of pressure wounds Eyes Complaints and Symptoms: No Complaints or Symptoms Medical History: Positive for: Cataracts - removed Dyment, Samyiah D. ([id number]) Negative for: Glaucoma; Optic Neuritis Ear/Nose/Mouth/Throat Complaints and Symptoms: No Complaints or Symptoms Medical History: Negative for: Chronic sinus problems/congestion; Middle ear problems Hematologic/Lymphatic Complaints and Symptoms: No Complaints or Symptoms Medical History: Negative for: Anemia; Hemophilia; Human  Immunodeficiency Virus; Lymphedema; Sickle  Cell Disease Respiratory Complaints and Symptoms: No Complaints or Symptoms Medical History: Negative for: Aspiration; Asthma; Chronic Obstructive Pulmonary Disease (COPD); Pneumothorax; Sleep Apnea; Tuberculosis Cardiovascular Complaints and Symptoms: No Complaints or Symptoms Medical History: Positive for: Hypertension - medication Negative for: Angina; Arrhythmia; Congestive Heart Failure; Coronary Artery Disease; Deep Vein Thrombosis; Hypotension; Myocardial Infarction; Peripheral Arterial Disease; Peripheral Venous Disease; Phlebitis; Vasculitis Gastrointestinal Complaints and Symptoms: No Complaints or Symptoms Medical History: Negative for: Cirrhosis ; Colitis; Crohnos; Hepatitis A; Hepatitis B; Hepatitis C Endocrine Complaints and Symptoms: No Complaints or Symptoms Medical History: STUTI, GRZESIAK ([id number]) Positive for: Type II Diabetes - Borderline Negative for: Type I Diabetes Genitourinary Complaints and Symptoms: No Complaints or Symptoms Medical History: Negative for: End Stage Renal Disease Immunological Complaints and Symptoms: No Complaints or Symptoms Medical History: Negative for: Lupus Erythematosus; Raynaudos; Scleroderma Musculoskeletal Complaints and Symptoms: No Complaints or Symptoms Medical History: Positive for: Osteoarthritis Negative for: Gout; Rheumatoid Arthritis; Osteomyelitis Neurologic Complaints and Symptoms: No Complaints or Symptoms Medical History: Negative for: Dementia; Neuropathy; Quadriplegia; Paraplegia; Seizure Disorder Oncologic Complaints and Symptoms: No Complaints or Symptoms Medical History: Negative for: Received Chemotherapy; Received Radiation Psychiatric Complaints and Symptoms: No Complaints or Symptoms Medical History: ELONNA, CALLEJO ([id number]) Negative for: Anorexia/bulimia; Confinement Anxiety HBO Extended History Items Eyes: Cataracts Family and Social  History Cancer: Yes - Mother; Diabetes: Yes - Paternal Grandparents; Heart Disease: Yes - Mother; Hypertension: No; Kidney Disease: Yes - Siblings; Lung Disease: Yes - Father; Seizures: No; Stroke: Yes - Mother, Father; Thyroid Problems: No; Current every day smoker - 1 pack daily; Marital Status - Widowed; Alcohol Use: Never; Drug Use: No History; Caffeine Use: Daily; Advanced Directives: No; Living Will: No Electronic Signature(s) Signed: 06/21/2015 11:45:12 AM By: Gretta Cool RN, BSN, Kim RN, BSN Signed: 06/22/2015 7:56:10 AM By: Linton Ham MD Entered By: Gretta Cool, RN, BSN, Kim on 06/21/2015 08:55:33 Jafri, [name] ([id number]) -------------------------------------------------------------------------------- SuperBill Details Patient Name: KESI, BRANK. Date of Service: 06/21/2015 Medical Record Patient Account Number: [id number] Number: Treating RN: Cornell Barman 1950-06-30 (65 y.o. Other Clinician: Date of Birth/Sex: Female) Treating ROBSON, MICHAEL Primary Care Physician/Extender: Wyline Beady, SYED Physician: Suella Grove in Treatment: 0 DEFRANCESCO, Referring Physician: Hassell Done Diagnosis Coding ICD-10 Codes Code Description [phone] Pressure ulcer of right buttock, stage 2 Facility Procedures CPT4 Code: [id number] Description: [id number] - WOUND CARE VISIT-LEV 3 EST PT Modifier: Quantity: 1 CPT4 Code: [id number] Description: [id number] - DEBRIDE WOUND 1ST 20 SQ CM OR < ICD-10 Description Diagnosis L89.312 Pressure ulcer of right buttock, stage 2 Modifier: Quantity: 1 Physician Procedures CPT4 Code: [id number] Description: WC PHYS LEVEL 3 o NEW PT ICD-10 Description Diagnosis L89.312 Pressure ulcer of right buttock, stage 2 Modifier: Quantity: 1 CPT4 Code: [id number] Description: [id number] - WC PHYS DEBR WO ANESTH 20 SQ CM ICD-10 Description Diagnosis L89.312 Pressure ulcer of right buttock, stage 2 Modifier: Quantity: 1 Electronic Signature(s) Signed: 06/22/2015 7:56:10 AM By: Linton Ham  MD Entered By: Linton Ham on 06/21/2015 09:41:50

## 2015-06-24 ENCOUNTER — Encounter: Payer: Self-pay | Admitting: Family Medicine

## 2015-06-24 ENCOUNTER — Ambulatory Visit (INDEPENDENT_AMBULATORY_CARE_PROVIDER_SITE_OTHER): Payer: PPO | Admitting: Family Medicine

## 2015-06-24 VITALS — BP 124/82 | HR 110 | Temp 98.1°F | Resp 16 | Ht 63.0 in | Wt 174.3 lb

## 2015-06-24 DIAGNOSIS — F410 Panic disorder [episodic paroxysmal anxiety] without agoraphobia: Secondary | ICD-10-CM

## 2015-06-24 DIAGNOSIS — R252 Cramp and spasm: Secondary | ICD-10-CM

## 2015-06-24 LAB — WOUND CULTURE

## 2015-06-24 MED ORDER — ALPRAZOLAM 1 MG PO TABS: 1.0000 mg | ORAL_TABLET | Freq: Every evening | ORAL | Status: DC | PRN

## 2015-06-24 MED ORDER — ALPRAZOLAM 0.5 MG PO TABS: 0.5000 mg | ORAL_TABLET | Freq: Three times a day (TID) | ORAL | Status: DC | PRN

## 2015-06-24 NOTE — Progress Notes (Signed)
Name: [name]   MRN: [id number]    DOB: [birth year]   Date:06/24/2015       Progress Note  Subjective  Chief Complaint  Chief Complaint  Patient presents with  . Follow-up    1 mo  . Medication Refill    HPI  Panic Disorder: Symptoms inlcude anxious, worried, muscle tension, and panic attacks. Anxiety is higher than usual because a couple of friends passed away recently and she had to go to court for her son's DWI. She takes Alprazolam 0.5 mg three times daily as needed and 1 mg at bedtime, seems to be helping relieve her anxiety.   Past Medical History  Diagnosis Date  . GERD (gastroesophageal reflux disease)   . Hypertension   . Chronic back pain   . Anxiety   . Depression   . Thyroid disease   . Psoriasis   . Pneumonia     RECENT X 3  . Arthritis     Past Surgical History  Procedure Laterality Date  . Bladder stimulator    . Hand surgery Right   . Oophorectomy Right     ovarian cyst  . Abdominal hysterectomy      heavy bleeding  . Cataract extraction w/phaco Right 03/07/2015    Procedure: CATARACT EXTRACTION PHACO AND INTRAOCULAR LENS PLACEMENT (IOC);  Surgeon: Estill Cotta, MD;  Location: [facility] ORS;  Service: Ophthalmology;  Laterality: Right;  [name] 01:14 AP% 26.4 CDE 33.04 fluid pack lot # [id number]  . Cataract extraction w/phaco Left 03/21/2015    Procedure: CATARACT EXTRACTION PHACO AND INTRAOCULAR LENS PLACEMENT (IOC);  Surgeon: Estill Cotta, MD;  Location: [facility] ORS;  Service: Ophthalmology;  Laterality: Left;  [name] 01:21 AP% 25.6 CDE 39.78 fluid pack lot # [id number]    Family History  Problem Relation Age of Onset  . Heart disease Mother   . COPD Father     Social History   Social History  . Marital Status: Widowed    Spouse Name: N/A  . Number of Children: N/A  . Years of Education: N/A   Occupational History  . Not on file.   Social History Main Topics  . Smoking status: Current Every Day Smoker -- 1.00 packs/day    Types:  Cigarettes  . Smokeless tobacco: Not on file  . Alcohol Use: No  . Drug Use: No  . Sexual Activity: No   Other Topics Concern  . Not on file   Social History Narrative     Current outpatient prescriptions:  .  acetaminophen (TYLENOL) 500 MG tablet, Take 500 mg by mouth every 6 (six) hours as needed., Disp: , Rfl:  .  ALPRAZolam (XANAX) 0.5 MG tablet, Take 1 tablet (0.5 mg total) by mouth 3 (three) times daily as needed for anxiety., Disp: 90 tablet, Rfl: 0 .  ALPRAZolam (XANAX) 1 MG tablet, Take 1 tablet (1 mg total) by mouth at bedtime as needed for anxiety., Disp: 30 tablet, Rfl: 0 .  amLODipine (NORVASC) 5 MG tablet, Take 1 tablet (5 mg total) by mouth daily., Disp: 90 tablet, Rfl: 0 .  citalopram (CELEXA) 20 MG tablet, Take 1 tablet (20 mg total) by mouth daily., Disp: 90 tablet, Rfl: 0 .  clobetasol ointment (TEMOVATE) [id number] %, Apply 1 application topically daily. Use twice a day for 2 weeks, then daily for 4 weeks., Disp: 30 g, Rfl: 3 .  levothyroxine (SYNTHROID, LEVOTHROID) 100 MCG tablet, Take 1 tablet (100 mcg total) by mouth daily before breakfast., Disp: 10  tablet, Rfl: 0 .  lisinopril (PRINIVIL,ZESTRIL) 10 MG tablet, Take 0.5 tablets (5 mg total) by mouth every morning. Take 5 mg every morning. (Patient taking differently: Take 10 mg by mouth every morning. Take 5 mg every morning.), Disp: 90 tablet, Rfl: 0 .  Secukinumab (COSENTYX Carlos), Inject into the skin every 30 (thirty) days., Disp: , Rfl:  .  Vitamin D, Ergocalciferol, (DRISDOL) 50000 units CAPS capsule, Take 1 capsule (50,000 Units total) by mouth once a week. For 12 weeks, Disp: 12 capsule, Rfl: 0  Allergies  Allergen Reactions  . Ciprofloxacin Hives and Diarrhea  . Oxycodone Diarrhea  . Oxycodone-Acetaminophen Diarrhea     Review of Systems  Constitutional: Negative for fever and chills.  Musculoskeletal: Positive for myalgias.  Psychiatric/Behavioral: Positive for depression. The patient is nervous/anxious.      Objective  Filed Vitals:   06/24/15 1046  BP: 124/82  Pulse: 110  Temp: 98.1 F (36.7 C)  TempSrc: Oral  Resp: 16  Height: 5\' 3"  (1.6 m)  Weight: 174 lb 4.8 oz (79.062 kg)  SpO2: 93%    Physical Exam  Constitutional: She is well-developed, well-nourished, and in no distress.  Cardiovascular: Regular rhythm.  Tachycardia present.   Pulmonary/Chest: Breath sounds normal.  Musculoskeletal:       Right lower leg: She exhibits tenderness. She exhibits no swelling and no edema.       Left lower leg: She exhibits tenderness. She exhibits no swelling and no edema.  Nursing note and vitals reviewed.    Assessment & Plan  1. Panic disorder without agoraphobia Stable and controlled on Alprazolam, refills provided.  - ALPRAZolam (XANAX) 0.5 MG tablet; Take 1 tablet (0.5 mg total) by mouth 3 (three) times daily as needed for anxiety.  Dispense: 90 tablet; Refill: 0 - ALPRAZolam (XANAX) 1 MG tablet; Take 1 tablet (1 mg total) by mouth at bedtime as needed for anxiety.  Dispense: 30 tablet; Refill: 0  2. Muscle cramps at night Likely muscle tightness from anxiety vs RLS. Obtain lab work to rule out secondary causes. - CBC with Differential - Comprehensive Metabolic Panel (CMET) - Ferritin   Kaitlyn Franko [facility] 06/24/2015 10:49 AM

## 2015-06-25 LAB — CBC WITH DIFFERENTIAL/PLATELET
BASOS ABS: 0.1 10*3/uL (ref 0.0–0.2)
Basos: 1 %
EOS (ABSOLUTE): 0.3 10*3/uL (ref 0.0–0.4)
EOS: 4 %
HEMATOCRIT: 46.6 % (ref 34.0–46.6)
HEMOGLOBIN: 15.6 g/dL (ref 11.1–15.9)
IMMATURE GRANS (ABS): 0 10*3/uL (ref 0.0–0.1)
Immature Granulocytes: 0 %
LYMPHS ABS: 2.3 10*3/uL (ref 0.7–3.1)
LYMPHS: 32 %
MCH: 31 pg (ref 26.6–33.0)
MCHC: 33.5 g/dL (ref 31.5–35.7)
MCV: 93 fL (ref 79–97)
MONOCYTES: 11 %
Monocytes Absolute: 0.8 10*3/uL (ref 0.1–0.9)
NEUTROS ABS: 3.9 10*3/uL (ref 1.4–7.0)
Neutrophils: 52 %
Platelets: 307 10*3/uL (ref 150–379)
RBC: 5.04 x10E6/uL (ref 3.77–5.28)
RDW: 13.3 % (ref 12.3–15.4)
WBC: 7.4 10*3/uL (ref 3.4–10.8)

## 2015-06-25 LAB — COMPREHENSIVE METABOLIC PANEL
ALBUMIN: 4.4 g/dL (ref 3.6–4.8)
ALT: 18 IU/L (ref 0–32)
AST: 18 IU/L (ref 0–40)
Albumin/Globulin Ratio: 1.7 (ref 1.2–2.2)
Alkaline Phosphatase: 117 IU/L (ref 39–117)
BUN / CREAT RATIO: 12 (ref 12–28)
BUN: 12 mg/dL (ref 8–27)
Bilirubin Total: 0.4 mg/dL (ref 0.0–1.2)
CO2: 26 mmol/L (ref 18–29)
CREATININE: 0.97 mg/dL (ref 0.57–1.00)
Calcium: 10 mg/dL (ref 8.7–10.3)
Chloride: 97 mmol/L (ref 96–106)
GFR calc non Af Amer: 61 mL/min/{1.73_m2} (ref >59)
GFR, EST AFRICAN AMERICAN: 70 mL/min/{1.73_m2} (ref >59)
GLOBULIN, TOTAL: 2.6 g/dL (ref 1.5–4.5)
GLUCOSE: 108 mg/dL — AB (ref 65–99)
Potassium: 4.9 mmol/L (ref 3.5–5.2)
SODIUM: 142 mmol/L (ref 134–144)
TOTAL PROTEIN: 7 g/dL (ref 6.0–8.5)

## 2015-06-25 LAB — FERRITIN: Ferritin: 85 ng/mL (ref 15–150)

## 2015-06-28 ENCOUNTER — Encounter: Payer: PPO | Admitting: Internal Medicine

## 2015-06-28 DIAGNOSIS — L89312 Pressure ulcer of right buttock, stage 2: Secondary | ICD-10-CM | POA: Diagnosis not present

## 2015-06-29 ENCOUNTER — Ambulatory Visit: Payer: PPO | Admitting: Internal Medicine

## 2015-06-30 NOTE — Progress Notes (Addendum)
[name] ([id number]) Visit Report for 06/28/2015 Allergy List Details Patient Name: [name], [name]. Date of Service: 06/28/2015 9:15 AM Medical Record Patient Account Number: [id number] Number: Treating RN: Cornell Barman July 13, 1950 (65 y.o. Other Clinician: Date of Birth/Sex: Female) Treating [name] Primary Care Physician: [facility], SYED Physician/Extender: G Referring Physician: Manuella Ghazi, SYED Weeks in Treatment: 1 Allergies Active Allergies Cipro oxycodone [facility] Allergy Notes Electronic Signature(s) Signed: 07/01/2015 11:56:40 AM By: Gretta Cool, RN, BSN, Kim RN, BSN Entered By: Gretta Cool, RN, BSN, Kim on 07/01/2015 11:05:55 [name], [name] ([id number]) -------------------------------------------------------------------------------- Arrival Information Details Patient Name: [name], [name]. Date of Service: 06/28/2015 9:15 AM Medical Record Patient Account Number: [id number] Number: Treating RN: Cornell Barman [birth year] (65 y.o. Other Clinician: Date of Birth/Sex: Female) Treating [name] Primary Care Physician: [facility], SYED Physician/Extender: G Referring Physician: Keith Rake Weeks in Treatment: 1 Visit Information Patient Arrived: Ambulatory Arrival Time: 09:28 Accompanied By: self Transfer Assistance: None Patient Identification Verified: Yes Secondary Verification Process Yes Completed: History Since Last Visit Added or deleted any medications: No Any new allergies or adverse reactions: No Had a fall or experienced change in activities of daily living that may affect risk of falls: No Signs or symptoms of abuse/neglect since last visito No Hospitalized since last visit: No Has Dressing in Place as Prescribed: Yes Pain Present Now: No Electronic Signature(s) Signed: 06/29/2015 5:05:53 PM By: Gretta Cool, RN, BSN, Kim RN, BSN Entered By: Gretta Cool, RN, BSN, Kim on 06/28/2015 09:28:17 [name]  ([id number]) -------------------------------------------------------------------------------- Clinic Level of Care Assessment Details Patient Name: [name]. Date of Service: 06/28/2015 9:15 AM Medical Record Patient Account Number: [id number] Number: Treating RN: Cornell Barman Jul 14, 1950 (65 y.o. Other Clinician: Date of Birth/Sex: Female) Treating ROBSON, [place] Primary Care Physician: [facility], SYED Physician/Extender: G Referring Physician: Manuella Ghazi, SYED Weeks in Treatment: 1 Clinic Level of Care Assessment Items TOOL 4 Quantity Score []  - Use when only an EandM is performed on FOLLOW-UP visit 0 ASSESSMENTS - Nursing Assessment / Reassessment X - Reassessment of Co-morbidities (includes updates in patient status) 1 10 []  - Reassessment of Adherence to Treatment Plan 0 ASSESSMENTS - Wound and Skin Assessment / Reassessment X - Simple Wound Assessment / Reassessment - one wound 1 5 []  - Complex Wound Assessment / Reassessment - multiple wounds 0 []  - Dermatologic / Skin Assessment (not related to wound area) 0 ASSESSMENTS - Focused Assessment []  - Circumferential Edema Measurements - multi extremities 0 []  - Nutritional Assessment / Counseling / Intervention 0 []  - Lower Extremity Assessment (monofilament, tuning fork, pulses) 0 []  - Peripheral Arterial Disease Assessment (using hand held doppler) 0 ASSESSMENTS - Ostomy and/or Continence Assessment and Care []  - Incontinence Assessment and Management 0 []  - Ostomy Care Assessment and Management (repouching, etc.) 0 PROCESS - Coordination of Care X - Simple Patient / Family Education for ongoing care 1 15 []  - Complex (extensive) Patient / Family Education for ongoing care 0 []  - Staff obtains Programmer, systems, Records, Test Results / Process Orders 0 []  - Staff telephones HHA, Nursing Homes / Clarify orders / etc 0 [name], [name]. ([id number]) []  - Routine Transfer to another Facility (non-emergent condition) 0 []  - Routine Hospital  Admission (non-emergent condition) 0 []  - New Admissions / Biomedical engineer / Ordering NPWT, Apligraf, etc. 0 []  - Emergency Hospital Admission (emergent condition) 0 X - Simple Discharge Coordination 1 10 []  - Complex (extensive) Discharge Coordination 0 PROCESS - Special Needs []  - Pediatric / Minor Patient  Management 0 []  - Isolation Patient Management 0 []  - Hearing / Language / Visual special needs 0 []  - Assessment of Community assistance (transportation, [name]/C planning, etc.) 0 []  - Additional assistance / Altered mentation 0 []  - Support Surface(s) Assessment (bed, cushion, seat, etc.) 0 INTERVENTIONS - Wound Cleansing / Measurement X - Simple Wound Cleansing - one wound 1 5 []  - Complex Wound Cleansing - multiple wounds 0 X - Wound Imaging (photographs - any number of wounds) 1 5 []  - Wound Tracing (instead of photographs) 0 X - Simple Wound Measurement - one wound 1 5 []  - Complex Wound Measurement - multiple wounds 0 INTERVENTIONS - Wound Dressings X - Small Wound Dressing one or multiple wounds 1 10 []  - Medium Wound Dressing one or multiple wounds 0 []  - Large Wound Dressing one or multiple wounds 0 []  - Application of Medications - topical 0 []  - Application of Medications - injection 0 [name], [name]. ([id number]) INTERVENTIONS - Miscellaneous []  - External ear exam 0 []  - Specimen Collection (cultures, biopsies, blood, body fluids, etc.) 0 []  - Specimen(s) / Culture(s) sent or taken to Lab for analysis 0 []  - Patient Transfer (multiple staff / Civil Service fast streamer / Similar devices) 0 []  - Simple Staple / Suture removal (25 or less) 0 []  - Complex Staple / Suture removal (26 or more) 0 []  - Hypo / Hyperglycemic Management (close monitor of Blood Glucose) 0 []  - Ankle / Brachial Index (ABI) - do not check if billed separately 0 X - Vital Signs 1 5 Has the patient been seen at the hospital within the last three years: Yes Total Score: 70 Level Of Care: New/Established -  Level 2 Electronic Signature(s) Signed: 06/29/2015 5:05:53 PM By: Gretta Cool, RN, BSN, Kim RN, BSN Entered By: Gretta Cool, RN, BSN, Kim on 06/28/2015 09:42:14 [name], [name] ([id number]) -------------------------------------------------------------------------------- Encounter Discharge Information Details Patient Name: ISEL, TRUGLIO [name]. Date of Service: 06/28/2015 9:15 AM Medical Record Patient Account Number: [id number] Number: Treating RN: Baruch Gouty, RN, BSN, Rita 1950-12-01 [phone] y.o. Other Clinician: Date of Birth/Sex: Female) Treating [name] Primary Care Physician: [facility], SYED Physician/Extender: G Referring Physician: Keith Rake Weeks in Treatment: 1 Encounter Discharge Information Items Discharge Pain Level: 0 Discharge Condition: Stable Ambulatory Status: Ambulatory Discharge Destination: Home Transportation: Private Auto Accompanied By: self Schedule Follow-up Appointment: Yes Medication Reconciliation completed and provided to Patient/Care Yes [name]: Provided on Clinical Summary of Care: 06/28/2015 Form Type Recipient Paper Patient [facility] Electronic Signature(s) Signed: 06/29/2015 5:05:53 PM By: Gretta Cool, RN, BSN, Kim RN, BSN Previous Signature: 06/28/2015 9:42:03 AM Version By: Ruthine Dose Entered By: Gretta Cool RN, BSN, Kim on 06/28/2015 09:42:55 [name], Jill DMarland Kitchen ([id number]) -------------------------------------------------------------------------------- Multi Wound Chart Details Patient Name: [name] [name]. Date of Service: 06/28/2015 9:15 AM Medical Record Patient Account Number: [id number] Number: Treating RN: Cornell Barman 1950/10/26 (65 y.o. Other Clinician: Date of Birth/Sex: Female) Treating [name] Primary Care Physician: [facility], SYED Physician/Extender: G Referring Physician: [facility], SYED Weeks in Treatment: 1 Vital Signs Height(in): 62 Pulse(bpm): 84 Weight(lbs): 175 Blood Pressure 112/68 (mmHg): Body Mass Index(BMI): 32 Temperature(F):  97.7 Respiratory Rate 18 (breaths/min): Photos: [N/A:N/A] Wound Location: Right Gluteus N/A N/A Wounding Event: Pressure Injury N/A N/A Primary Etiology: Pressure Ulcer N/A N/A Comorbid History: Cataracts, Hypertension, N/A N/A Type II Diabetes, Osteoarthritis Date Acquired: 04/21/2015 N/A N/A Weeks of Treatment: 1 N/A N/A Wound Status: Open N/A N/A Measurements L x W x [name] 0.7x0.5x0.1 N/A N/A (cm) Area (cm) : 0.275 N/A N/A Volume (  cm) : 0.027 N/A N/A % Reduction in Area: 0.00% N/A N/A % Reduction in Volume: 0.00% N/A N/A Classification: Category/Stage II N/A N/A Exudate Amount: Medium N/A N/A Exudate Type: Serous N/A N/A Exudate Color: amber N/A N/A Wound Margin: Flat and Intact N/A N/A Granulation Amount: Small (1-33%) N/A N/A [name], [name]. ([id number]) Granulation Quality: Pink N/A N/A Necrotic Amount: Small (1-33%) N/A N/A Epithelialization: Small (1-33%) N/A N/A Periwound Skin Texture: Edema: No N/A N/A Excoriation: No Induration: No Callus: No Crepitus: No Fluctuance: No Friable: No Rash: No Scarring: No Periwound Skin Maceration: No N/A N/A Moisture: Moist: No Dry/Scaly: No Periwound Skin Color: Atrophie Blanche: No N/A N/A Cyanosis: No Ecchymosis: No Erythema: No Hemosiderin Staining: No Mottled: No Pallor: No Rubor: No Tenderness on No N/A N/A Palpation: Wound Preparation: Ulcer Cleansing: N/A N/A Rinsed/Irrigated with Saline Topical Anesthetic Applied: None Treatment Notes Electronic Signature(s) Signed: 06/29/2015 5:05:53 PM By: Gretta Cool, RN, BSN, Kim RN, BSN Entered By: Gretta Cool, RN, BSN, Kim on 06/28/2015 09:35:01 [name], [name] ([id number]) -------------------------------------------------------------------------------- Multi-Disciplinary Care Plan Details Patient Name: [name], [name]. Date of Service: 06/28/2015 9:15 AM Medical Record Patient Account Number: [id number] Number: Treating RN: Cornell Barman Jun 14, 1950 (65 y.o. Other  Clinician: Date of Birth/Sex: Female) Treating ROBSON, [place] Primary Care Physician: [facility], SYED Physician/Extender: G Referring Physician: Keith Rake Weeks in Treatment: 1 Active Inactive Nutrition Nursing Diagnoses: Imbalanced nutrition Goals: Patient/caregiver will maintain therapeutic glucose control Date Initiated: 06/21/2015 Goal Status: Active Interventions: Provide education on elevated blood sugars and impact on wound healing Notes: Orientation to the Wound Care Program Nursing Diagnoses: Knowledge deficit related to the wound healing center program Goals: Patient/caregiver will verbalize understanding of the [facility] Program Date Initiated: 06/21/2015 Goal Status: Active Interventions: Provide education on orientation to the wound center Notes: Pressure Nursing Diagnoses: Knowledge deficit related to management of pressures ulcers Goals: ANYRA, KEDZIOR ([id number]) Patient will remain free of pressure ulcers Date Initiated: 06/21/2015 Goal Status: Active Interventions: Provide education on pressure ulcers Notes: Wound/Skin Impairment Nursing Diagnoses: Impaired tissue integrity Goals: Ulcer/skin breakdown will heal within 14 weeks Date Initiated: 06/21/2015 Goal Status: Active Interventions: Assess patient/caregiver ability to obtain necessary supplies Notes: Electronic Signature(s) Signed: 06/29/2015 5:05:53 PM By: Gretta Cool, RN, BSN, Kim RN, BSN Entered By: Gretta Cool, RN, BSN, Kim on 06/28/2015 09:31:35 [name], Jill DMarland Kitchen ([id number]) -------------------------------------------------------------------------------- Pain Assessment Details Patient Name: [name] [name]. Date of Service: 06/28/2015 9:15 AM Medical Record Patient Account Number: [id number] Number: Treating RN: Cornell Barman Sep 28, 1950 (65 y.o. Other Clinician: Date of Birth/Sex: Female) Treating [name] Primary Care Physician: [facility], SYED Physician/Extender: G Referring  Physician: Keith Rake Weeks in Treatment: 1 Active Problems Location of Pain Severity and Description of Pain Patient Has Paino No Site Locations Pain Management and Medication Current Pain Management: Electronic Signature(s) Signed: 06/29/2015 5:05:53 PM By: Gretta Cool, RN, BSN, Kim RN, BSN Entered By: Gretta Cool, RN, BSN, Kim on 06/28/2015 09:28:36 [name] ([id number]) -------------------------------------------------------------------------------- Patient/Caregiver Education Details Patient Name: [name]. Date of Service: 06/28/2015 9:15 AM Medical Record Patient Account Number: [id number] Number: Treating RN: Cornell Barman October 07, 1950 (65 y.o. Other Clinician: Date of Birth/Gender: Female) Treating [name] Primary Care Physician: [facility], SYED Physician/Extender: G Referring Physician: [facility] in Treatment: 1 Education Assessment Education Provided To: Patient Education Topics Provided Wound/Skin Impairment: Handouts: Caring for Your Ulcer, Other: continue wound care as prescribed Methods: Demonstration Responses: State content correctly Electronic Signature(s) Signed: 06/29/2015 5:05:53 PM By: Gretta Cool, RN, BSN, Kim RN, BSN  Entered By: Gretta Cool, RN, BSN, Kim on 06/28/2015 09:43:18 [name], [name] ([id number]) -------------------------------------------------------------------------------- Wound Assessment Details Patient Name: SHAVONTAE, WILCOXSON [name]. Date of Service: 06/28/2015 9:15 AM Medical Record Patient Account Number: [id number] Number: Treating RN: Cornell Barman 06/14/1950 (65 y.o. Other Clinician: Date of Birth/Sex: Female) Treating [name] Primary Care Physician: [facility], SYED Physician/Extender: G Referring Physician: Manuella Ghazi, SYED Weeks in Treatment: 1 Wound Status Wound Number: 1 Primary Pressure Ulcer Etiology: Wound Location: Right Gluteus Wound Open Wounding Event: Pressure Injury Status: Date Acquired: 04/21/2015 Comorbid Cataracts,  Hypertension, Type II Weeks Of Treatment: 1 History: Diabetes, Osteoarthritis Clustered Wound: No Photos Photo Uploaded By: Gretta Cool, RN, BSN, Kim on 06/28/2015 09:33:14 Wound Measurements Length: (cm) 0.7 % Reduction in Ar Width: (cm) 0.5 % Reduction in Vo Depth: (cm) 0.1 Epithelialization Area: (cm) 0.275 Volume: (cm) 0.027 ea: 0% lume: 0% : Small (1-33%) Wound Description Classification: Category/Stage II Wound Margin: Flat and Intact Exudate Amount: Medium Exudate Type: Serous Exudate Color: amber Wound Bed Granulation Amount: Small (1-33%) Granulation Quality: Pink Necrotic Amount: Small (1-33%) Buttermore, Kiylah [name]. ([id number]) Necrotic Quality: Adherent Slough Periwound Skin Texture Texture Color No Abnormalities Noted: No No Abnormalities Noted: No Callus: No Atrophie Blanche: No Crepitus: No Cyanosis: No Excoriation: No Ecchymosis: No Fluctuance: No Erythema: No Friable: No Hemosiderin Staining: No Induration: No Mottled: No Localized Edema: No Pallor: No Rash: No Rubor: No Scarring: No Moisture No Abnormalities Noted: No Dry / Scaly: No Maceration: No Moist: No Wound Preparation Ulcer Cleansing: Rinsed/Irrigated with Saline Topical Anesthetic Applied: None Treatment Notes Wound #1 (Right Gluteus) 1. Cleansed with: Clean wound with Normal Saline 4. Dressing Applied: Aquacel Ag 5. Secondary Dressing Applied Bordered Foam Dressing Electronic Signature(s) Signed: 06/29/2015 5:05:53 PM By: Gretta Cool, RN, BSN, Kim RN, BSN Entered By: Gretta Cool, RN, BSN, Kim on 06/28/2015 09:31:15 Pester, [name] ([id number]) -------------------------------------------------------------------------------- [place] Details Patient Name: [name] [name]. Date of Service: 06/28/2015 9:15 AM Medical Record Patient Account Number: [id number] Number: Treating RN: Cornell Barman 01/28/1951 (65 y.o. Other Clinician: Date of Birth/Sex: Female) Treating [name] Primary Care  Physician: [facility] [place]: G Referring Physician: [facility], SYED Weeks in Treatment: 1 Vital Signs Time Taken: 09:28 Temperature (F): 97.7 Height (in): 62 Pulse (bpm): 84 Weight (lbs): 175 Respiratory Rate (breaths/min): 18 Body Mass Index (BMI): 32 Blood Pressure (mmHg): 112/68 Reference Range: 80 - 120 mg / dl Electronic Signature(s) Signed: 06/29/2015 5:05:53 PM By: Gretta Cool, RN, BSN, Kim RN, BSN Entered By: Gretta Cool, RN, BSN, Kim on 06/28/2015 [id number]

## 2015-06-30 NOTE — Progress Notes (Signed)
Jill, [name] ([id number]) Visit Report for 06/28/2015 Chief Complaint Document Details Patient Name: [name], [name]. Date of Service: 06/28/2015 9:15 AM Medical Record Patient Account Number: [id number] Number: Treating RN: Baruch Gouty, RN, BSN, Rita 08-28-1950 [phone] y.o. Other Clinician: Date of Birth/Sex: Female) Treating Tito Ausmus Primary Care Physician/Extender: Wyline Beady, SYED Physician: Referring Physician: Keith Rake Weeks in Treatment: 1 Information Obtained from: Patient Chief Complaint Patient arrives for review of an advertised pressure ulcer on her right buttock Electronic Signature(s) Signed: 06/29/2015 7:52:30 AM By: Linton Ham MD Entered By: Linton Ham on 06/28/2015 09:42:31 Sanguinetti, Tujuana DMarland Kitchen ([id number]) -------------------------------------------------------------------------------- HPI Details Patient Name: Jill, [name] Date of Service: 06/28/2015 9:15 AM Medical Record Patient Account Number: [id number] Number: Treating RN: Baruch Gouty, RN, BSN, Rita 1950-09-02 [phone] y.o. Other Clinician: Date of Birth/Sex: Female) Treating Rockey Guarino Primary Care Physician/Extender: Wyline Beady, SYED Physician: Referring Physician: Keith Rake Weeks in Treatment: 1 History of Present Illness HPI Description: 06/21/15; this is a 65 year old woman who arrives with a 2 to three-month history of what his average size this is stage II pressure ulcer on her right buttock. She is referred from her her OB/GYN who is been following her closely for Lichen sclerosis. She is been applying steroid cream to the wound on her buttock which is what is been prescribed for her chronic vulvar issues. [facility] I can see reference of this to March 9 again at her gynecologist where she was listed as having an "boil of her buttock". She does not have a prior history of skin abscesses. She does have a history of psoriasis although this is been well controlled  over the last year with Cosentyx. In terms of the risk factors for pressure ulcers I really can't pick these up for. She is ambulatory. Does not give an easy history of being immobilized for a prolonged period of time/hard surfaces etc. she lives alone and seems to be reasonably independent. She had a recently elevated hemoglobin A1c of 6 but is not a known diabetic 06/28/15; culture I did of this area on her right buttock grew MRSA. This is raise my suspicion that this was initially a furuncle. It already however looks somewhat better with the Aquacel Ag Electronic Signature(s) Signed: 06/29/2015 7:52:30 AM By: Linton Ham MD Entered By: Linton Ham on 06/28/2015 09:43:56 Pavlicek, Verneda Skill ([id number]) -------------------------------------------------------------------------------- Physical Exam Details Patient Name: Jill, [name]. Date of Service: 06/28/2015 9:15 AM Medical Record Patient Account Number: [id number] Number: Treating RN: Baruch Gouty, RN, BSN, Rita 1950/03/12 [phone] y.o. Other Clinician: Date of Birth/Sex: Female) Treating Akshith Moncus Primary Care Physician/Extender: Wyline Beady, SYED Physician: Referring Physician: Manuella Ghazi, SYED Weeks in Treatment: 1 Constitutional Sitting or standing Blood Pressure is within target range for patient.. Pulse regular and within target range for patient.Marland Kitchen Respirations regular, non-labored and within target range.. Temperature is normal and within the target range for the patient.. Eyes Conjunctivae clear. No discharge. No scleral icterus. Respiratory Respiratory effort is easy and symmetric bilaterally. Rate is normal at rest and on room air.. Integumentary (Hair, Skin) Area and the question is on her right buttock just near the gluteal fold inferiorly. This already looks somewhat better with epithelialization. There is no subcutaneous involvement. Skin and subcutaneous tissue without induration or subcutaneous nodules.Marland Kitchen Psychiatric No  evidence of depression, anxiety, or agitation. Calm, cooperative, and communicative. Appropriate interactions and affect.. Notes Wound exam; right buttock area is a small superficial and no surrounding erythema. No subcutaneous involvement is  appreciated. The exquisite tenderness from last week seems better and there is observed advancing epithelialization Electronic Signature(s) Signed: 06/29/2015 7:52:30 AM By: Linton Ham MD Entered By: Linton Ham on 06/28/2015 09:47:12 Vandall, Verneda Skill ([id number]) -------------------------------------------------------------------------------- Physician Orders Details Patient Name: Jill Gip D. Date of Service: 06/28/2015 9:15 AM Medical Record Patient Account Number: [id number] Number: Treating RN: Cornell Barman 04/21/1950 (65 y.o. Other Clinician: Date of Birth/Sex: Female) Treating Gabbriella Presswood Primary Care Physician/Extender: Wyline Beady, SYED Physician: Referring Physician: Keith Rake Weeks in Treatment: 1 Verbal / Phone Orders: Yes Clinician: Cornell Barman Read Back and Verified: No Diagnosis Coding Wound Cleansing Wound #1 Right Gluteus o Clean wound with Normal Saline. Skin Barriers/Peri-Wound Care Wound #1 Right Gluteus o Barrier cream Primary Wound Dressing Wound #1 Right Gluteus o Aquacel Ag Secondary Dressing Wound #1 Right Gluteus o Boardered Foam Dressing Dressing Change Frequency Wound #1 Right Gluteus o Three times weekly Follow-up Appointments Wound #1 Right Gluteus o Return Appointment in 1 week. Additional Orders / Instructions Wound #1 Right Gluteus o Activity as tolerated o Other: - switch positions while sitting Medications-please add to medication list. Wound #1 Right Gluteus o P.O. Antibiotics [name], [name] ([id number]) Patient Medications Allergies: Cipro, oxycodone Notifications Medication Indication Start End Bactrim DS DOSE 1 - oral 800 mg-160 mg tablet - 1 tablet  oral Electronic Signature(s) Signed: 06/29/2015 7:52:30 AM By: Linton Ham MD Signed: 06/29/2015 5:05:53 PM By: Gretta Cool RN, BSN, Kim RN, BSN Entered By: Gretta Cool, RN, BSN, Kim on 06/28/2015 09:41:45 MYESHA, MOFFATT ([id number]) -------------------------------------------------------------------------------- Prescription 06/28/2015 Patient Name: JAKOBI, MAINWARING. Physician: Ricard Dillon MD Date of Birth: 06-Jun-1950 NPI#: [id number] Sex: F DEA#: [id number] Phone #: [phone] License #: [id number] Patient Address: [facility] [address] [address] [place], [place] [address] [phone] Allergies Cipro oxycodone Medication Medication: Route: Strength: Form: Bactrim DS oral 800 mg-160 mg tablet Class: ABSORBABLE SULFONAMIDE ANTIBACTERIAL AGENTS Dose: Frequency / Time: Indication: 1 1 tablet oral Number of Refills: Number of Units: 0 Generic Substitution: Start Date: End Date: Administered at [place]: No Note to Pharmacy: Signature(s): Date(s): YALEXA, HANDELMAN ([id number]) Electronic Signature(s) Signed: 06/29/2015 7:52:30 AM By: Linton Ham MD Entered By: Linton Ham on 06/28/2015 09:48:52 Bolt, Babbette DMarland Kitchen ([id number]) --------------------------------------------------------------------------------  Problem List Details Patient Name: SNOW, SHUFF D. Date of Service: 06/28/2015 9:15 AM Medical Record Patient Account Number: [id number] Number: Treating RN: Baruch Gouty, RN, BSN, Rita [birth year] [phone] y.o. Other Clinician: Date of Birth/Sex: Female) Treating Aviella Disbrow Primary Care Physician/Extender: Wyline Beady, SYED Physician: Referring Physician: Keith Rake Weeks in Treatment: 1 Active Problems ICD-10 Encounter Code Description Active Date Diagnosis L89.312 Pressure ulcer of right buttock, stage 2 06/21/2015  Yes Inactive Problems Resolved Problems Electronic Signature(s) Signed: 06/29/2015 7:52:30 AM By: Linton Ham MD Entered By: Linton Ham on 06/28/2015 09:42:19 Lazare, Olia D. ([id number]) -------------------------------------------------------------------------------- Progress Note Details Patient Name: Jill Gip D. Date of Service: 06/28/2015 9:15 AM Medical Record Patient Account Number: [id number] Number: Treating RN: Baruch Gouty, RN, BSN, Rita 1950/08/09 [phone] y.o. Other Clinician: Date of Birth/Sex: Female) Treating Emyah Roznowski Primary Care Physician/Extender: Wyline Beady, SYED Physician: Referring Physician: Keith Rake Weeks in Treatment: 1 Subjective Chief Complaint Information obtained from Patient Patient arrives for review of an advertised pressure ulcer on her right buttock History of Present Illness (HPI) 06/21/15; this is a 65 year old woman who arrives with a 2 to three-month history of  what his average size this is stage II pressure ulcer on her right buttock. She is referred from her her OB/GYN who is been following her closely for Lichen sclerosis. She is been applying steroid cream to the wound on her buttock which is what is been prescribed for her chronic vulvar issues. [facility] I can see reference of this to March 9 again at her gynecologist where she was listed as having an "boil of her buttock". She does not have a prior history of skin abscesses. She does have a history of psoriasis although this is been well controlled over the last year with Cosentyx. In terms of the risk factors for pressure ulcers I really can't pick these up for. She is ambulatory. Does not give an easy history of being immobilized for a prolonged period of time/hard surfaces etc. she lives alone and seems to be reasonably independent. She had a recently elevated hemoglobin A1c of 6 but is not a known diabetic 06/28/15; culture I did of this area on her  right buttock grew MRSA. This is raise my suspicion that this was initially a furuncle. It already however looks somewhat better with the Aquacel Ag Objective Constitutional Sitting or standing Blood Pressure is within target range for patient.. Pulse regular and within target range for patient.Marland Kitchen Respirations regular, non-labored and within target range.. Temperature is normal and within the target range for the patient.. Vitals Time Taken: 9:28 AM, Height: 62 in, Weight: 175 lbs, BMI: 32, Temperature: 97.7 F, Pulse: 84 bpm, Respiratory Rate: 18 breaths/min, Blood Pressure: 112/68 mmHg. CHYENNE, EGY ([id number]) Eyes Conjunctivae clear. No discharge. No scleral icterus. Respiratory Respiratory effort is easy and symmetric bilaterally. Rate is normal at rest and on room air.Marland Kitchen Psychiatric No evidence of depression, anxiety, or agitation. Calm, cooperative, and communicative. Appropriate interactions and affect.. General Notes: Wound exam; right buttock area is a small superficial and no surrounding erythema. No subcutaneous involvement is appreciated. The exquisite tenderness from last week seems better and there is observed advancing epithelialization Integumentary (Hair, Skin) Area and the question is on her right buttock just near the gluteal fold inferiorly. This already looks somewhat better with epithelialization. There is no subcutaneous involvement. Skin and subcutaneous tissue without induration or subcutaneous nodules.. Wound #1 status is Open. Original cause of wound was Pressure Injury. The wound is located on the Right Gluteus. The wound measures 0.7cm length x 0.5cm width x 0.1cm depth; 0.275cm^2 area and 0.027cm^3 volume. There is a medium amount of serous drainage noted. The wound margin is flat and intact. There is small (1-33%) pink granulation within the wound bed. There is a small (1-33%) amount of necrotic tissue within the wound bed including Adherent Slough. The  periwound skin appearance did not exhibit: Callus, Crepitus, Excoriation, Fluctuance, Friable, Induration, Localized Edema, Rash, Scarring, Dry/Scaly, Maceration, Moist, Atrophie Blanche, Cyanosis, Ecchymosis, Hemosiderin Staining, Mottled, Pallor, Rubor, Erythema. Assessment Active Problems ICD-10 L89.312 - Pressure ulcer of right buttock, stage 2 Plan Wound Cleansing: Wound #1 Right Gluteus: Clean wound with Normal Saline. Skin Barriers/Peri-Wound Care: LAKISH, MOCKBEE ([id number]) Wound #1 Right Gluteus: Barrier cream Primary Wound Dressing: Wound #1 Right Gluteus: Aquacel Ag Secondary Dressing: Wound #1 Right Gluteus: Boardered Foam Dressing Dressing Change Frequency: Wound #1 Right Gluteus: Three times weekly Follow-up Appointments: Wound #1 Right Gluteus: Return Appointment in 1 week. Additional Orders / Instructions: Wound #1 Right Gluteus: Activity as tolerated Other: - switch positions while sitting Medications-please add to medication list.: Wound #1  Right Gluteus: P.O. Antibiotics The following medication(s) was prescribed: Bactrim DS oral 800 mg-160 mg tablet 1 1 tablet oral 1 Septra ds 1po bid for 10 days conetinue the Aquacel AG with foam cover Electronic Signature(s) Signed: 06/29/2015 7:52:30 AM By: Linton Ham MD Entered By: Linton Ham on 06/28/2015 09:48:09 Greening, Kimbley D. ([id number]) -------------------------------------------------------------------------------- SuperBill Details Patient Name: Jill Gip D. Date of Service: 06/28/2015 Medical Record Patient Account Number: [id number] Number: Treating RN: Cornell Barman 23-Aug-1950 (65 y.o. Other Clinician: Date of Birth/Sex: Female) Treating Abdulla Pooley Primary Care Physician/Extender: Wyline Beady, SYED Physician: Suella Grove in Treatment: 1 Referring Physician: [facility], SYED Diagnosis Coding ICD-10 Codes Code Description [phone] Pressure ulcer of right buttock, stage 2 Facility  Procedures CPT4 Code: [id number] Description: [phone] - WOUND CARE VISIT-LEV 2 EST PT Modifier: Quantity: 1 Physician Procedures CPT4 Code: [id number] Description: [id number] - WC PHYS LEVEL 3 - EST PT ICD-10 Description Diagnosis L89.312 Pressure ulcer of right buttock, stage 2 Modifier: Quantity: 1 Electronic Signature(s) Signed: 06/29/2015 7:52:30 AM By: Linton Ham MD Entered By: Linton Ham on 06/28/2015 09:48:44

## 2015-07-01 ENCOUNTER — Ambulatory Visit (INDEPENDENT_AMBULATORY_CARE_PROVIDER_SITE_OTHER): Payer: PPO | Admitting: Urology

## 2015-07-01 VITALS — Ht 62.5 in | Wt 176.8 lb

## 2015-07-01 DIAGNOSIS — R339 Retention of urine, unspecified: Secondary | ICD-10-CM

## 2015-07-01 DIAGNOSIS — F410 Panic disorder [episodic paroxysmal anxiety] without agoraphobia: Secondary | ICD-10-CM | POA: Insufficient documentation

## 2015-07-01 DIAGNOSIS — Z789 Other specified health status: Secondary | ICD-10-CM | POA: Insufficient documentation

## 2015-07-01 NOTE — Progress Notes (Signed)
07/01/2015 2:26 PM   [name] 06-Oct-1950 [id number]  Referring provider: Roselee Nova, MD [address] [place], [place] [address]  Chief Complaint  Patient presents with  . Follow-up    possible interstim    HPI: The patient by the referral note hasn't InterStim device inserted by Dr. Davis Gourd. I did note some of her history included panic attacks etc.  The patient had an InterStim year 2010. It's close to remove midline and hurt. She thinks it has not worked for 1 year. The patient reports that her flow is currently good. She sometimes gets up once at night. In voids every 2 hours. She wears 2-3 light pads per day and denies urge incontinence.  She says she does not feel stimulation and she thinks the device is off  He says the device is sore. He said his been painful for months or yearsModifying factors:   There are no other modifying factors  Associated signs and symptoms: There are no other associated signs and symptoms Aggravating and relieving factors: There are no other aggravating or relieving factors Severity: Moderate Duration: Persistent   PMH: Past Medical History  Diagnosis Date  . GERD (gastroesophageal reflux disease)   . Hypertension   . Chronic back pain   . Anxiety   . Depression   . Thyroid disease   . Psoriasis   . Pneumonia     RECENT X 3  . Arthritis     Surgical History: Past Surgical History  Procedure Laterality Date  . Bladder stimulator    . Hand surgery Right   . Oophorectomy Right     ovarian cyst  . Abdominal hysterectomy      heavy bleeding  . Cataract extraction w/phaco Right 03/07/2015    Procedure: CATARACT EXTRACTION PHACO AND INTRAOCULAR LENS PLACEMENT (IOC);  Surgeon: Estill Cotta, MD;  Location: [facility] ORS;  Service: Ophthalmology;  Laterality: Right;  [name] 01:14 AP% 26.4 CDE 33.04 fluid pack lot # [id number]  . Cataract extraction w/phaco Left 03/21/2015    Procedure: CATARACT EXTRACTION PHACO  AND INTRAOCULAR LENS PLACEMENT (IOC);  Surgeon: Estill Cotta, MD;  Location: [facility] ORS;  Service: Ophthalmology;  Laterality: Left;  [name] 01:21 AP% 25.6 CDE 39.78 fluid pack lot # [id number]    Home Medications:    Medication List       This list is accurate as of: 07/01/15  2:26 PM.  Always use your most recent med list.               acetaminophen 500 MG tablet  Commonly known as:  TYLENOL  Take 500 mg by mouth every 6 (six) hours as needed. Reported on 07/01/2015     ALPRAZolam 0.5 MG tablet  Commonly known as:  XANAX  Take 1 tablet (0.5 mg total) by mouth 3 (three) times daily as needed for anxiety.     ALPRAZolam 1 MG tablet  Commonly known as:  XANAX  Take 1 tablet (1 mg total) by mouth at bedtime as needed for anxiety.     amLODipine 5 MG tablet  Commonly known as:  NORVASC  Take 1 tablet (5 mg total) by mouth daily.     citalopram 20 MG tablet  Commonly known as:  CELEXA  Take 1 tablet (20 mg total) by mouth daily.     clobetasol ointment 0.05 %  Commonly known as:  TEMOVATE  Apply 1 application topically daily. Use twice a day for 2 weeks, then daily  for 4 weeks.     COSENTYX [place]  Inject into the skin every 30 (thirty) days.     levothyroxine 100 MCG tablet  Commonly known as:  SYNTHROID, LEVOTHROID  Take 1 tablet (100 mcg total) by mouth daily before breakfast.     lisinopril 10 MG tablet  Commonly known as:  PRINIVIL,ZESTRIL  Take 0.5 tablets (5 mg total) by mouth every morning. Take 5 mg every morning.     Vitamin D (Ergocalciferol) 50000 units Caps capsule  Commonly known as:  DRISDOL  Take 1 capsule (50,000 Units total) by mouth once a week. For 12 weeks        Allergies:  Allergies  Allergen Reactions  . Ciprofloxacin Hives and Diarrhea  . Oxycodone Diarrhea  . Oxycodone-Acetaminophen Diarrhea  . Sulfa Antibiotics Rash    Throat, mouth    Family History: Family History  Problem Relation Age of Onset  . Heart disease Mother   . COPD  Father     Social History:  reports that she has been smoking Cigarettes.  She has been smoking about 1.00 pack per day. She does not have any smokeless tobacco history on file. She reports that she does not drink alcohol or use illicit drugs.  ROS: UROLOGY Frequent Urination?: Yes Hard to postpone urination?: Yes Burning/pain with urination?: No Get up at night to urinate?: Yes Leakage of urine?: Yes Urine stream starts and stops?: No Trouble starting stream?: No Do you have to strain to urinate?: No Blood in urine?: No Urinary tract infection?: No Sexually transmitted disease?: No Injury to kidneys or bladder?: No Painful intercourse?: No Weak stream?: No Currently pregnant?: No Vaginal bleeding?: No Last menstrual period?: n  Gastrointestinal Nausea?: No Vomiting?: No Indigestion/heartburn?: Yes Diarrhea?: No Constipation?: No  Constitutional Fever: No Night sweats?: No Weight loss?: No Fatigue?: Yes  Skin Skin rash/lesions?: Yes Itching?: Yes  Eyes Blurred vision?: No Double vision?: No  Ears/Nose/Throat Sore throat?: No Sinus problems?: No  Hematologic/Lymphatic Swollen glands?: No Easy bruising?: No  Cardiovascular Leg swelling?: No Chest pain?: No  Respiratory Cough?: No Shortness of breath?: No  Endocrine Excessive thirst?: Yes  Musculoskeletal Back pain?: Yes Joint pain?: Yes  Neurological Headaches?: No Dizziness?: Yes  Psychologic Depression?: Yes Anxiety?: Yes  Physical Exam: Ht 5' 2.5" (1.588 m)  Wt 176 lb 12.8 oz (80.196 kg)  BMI 31.80 kg/m2  Constitutional:  Alert and oriented, No acute distress. HEENT: [place] AT, moist mucus membranes.  Trachea midline, no masses. Cardiovascular: No clubbing, cyanosis, or edema. Respiratory: Normal respiratory effort, no increased work of breathing. GI: Abdomen is soft, nontender, nondistended, no abdominal masses GU: No CVA tenderness. Tender over InterStim device with no cellulitis or  infection. Device close to midline on left side Skin: No rashes, bruises or suspicious lesions. Lymph: No cervical or inguinal adenopathy. Neurologic: Grossly intact, no focal deficits, moving all 4 extremities. Psychiatric: Normal mood and affect.  Laboratory Data: Lab Results  Component Value Date   WBC 7.4 06/24/2015   HGB 14.8 06/22/2014   HCT 46.6 06/24/2015   MCV 93 06/24/2015   PLT 307 06/24/2015    Lab Results  Component Value Date   CREATININE 0.97 06/24/2015    No results found for: PSA  No results found for: TESTOSTERONE  Lab Results  Component Value Date   HGBA1C 6.0* 05/12/2015    Urinalysis    Component Value Date/Time   COLORURINE YELLOW* 06/22/2014 1634   COLORURINE Yellow 10/10/2011 1219   APPEARANCEUR  HAZY* 06/22/2014 1634   APPEARANCEUR Cloudy 10/10/2011 1219   LABSPEC 1.020 06/22/2014 1634   LABSPEC 1.012 10/10/2011 1219   PHURINE 5.0 06/22/2014 1634   PHURINE 5.0 10/10/2011 1219   GLUCOSEU NEGATIVE 06/22/2014 1634   GLUCOSEU Negative 10/10/2011 1219   HGBUR NEGATIVE 06/22/2014 1634   HGBUR Negative 10/10/2011 [place] 06/22/2014 1634   BILIRUBINUR Negative 10/10/2011 [place] 06/22/2014 1634   KETONESUR Negative 10/10/2011 1219   PROTEINUR 30* 06/22/2014 1634   PROTEINUR Negative 10/10/2011 1219   NITRITE NEGATIVE 06/22/2014 1634   NITRITE Positive 10/10/2011 1219   LEUKOCYTESUR 2+* 06/22/2014 1634   LEUKOCYTESUR 2+ 10/10/2011 1219    Pertinent Imaging: None  Assessment & Plan:  The patient is device was actually on and we turned it off. She knows how to turn it on.  I will reassess in the next 1-2 weeks in the next time in clinic. I do not want to remove the device was truly helping her. If she needs a device I will then discuss pain. If she does not need the device it will be removed. I did not give her narcotics today when she asked me how to treat the pain. I recommended when necessary  Advil  There are no diagnoses linked to this encounter.  No Follow-up on file.  Reece Packer, MD  [facility] [address], [place], [place] [address] [phone]

## 2015-07-05 ENCOUNTER — Encounter: Payer: PPO | Admitting: Internal Medicine

## 2015-07-05 DIAGNOSIS — L89312 Pressure ulcer of right buttock, stage 2: Secondary | ICD-10-CM | POA: Diagnosis not present

## 2015-07-05 NOTE — Progress Notes (Addendum)
[name], [name] ([id number]) Visit Report for 07/05/2015 Arrival Information Details Patient Name: [name], [name]. Date of Service: 07/05/2015 10:00 AM Medical Record Patient Account Number: [id number] Number: Treating RN: Montey Hora 1950-10-05 (65 y.o. Other Clinician: Date of Birth/Sex: Female) Treating ROBSON, MICHAEL Primary Care Physician: [facility], [facility] Physician/Extender: G Referring Physician: Keith Rake Weeks in Treatment: 2 Visit Information History Since Last Visit Added or deleted any medications: No Patient Arrived: Ambulatory Any new allergies or adverse reactions: No Arrival Time: 10:16 Had a fall or experienced change in No Accompanied By: self activities of daily living that may affect Transfer Assistance: None risk of falls: Patient Identification Verified: Yes Signs or symptoms of abuse/neglect since last No Secondary Verification Process Yes visito Completed: Hospitalized since last visit: No Pain Present Now: Yes Electronic Signature(s) Signed: 07/05/2015 3:16:30 PM By: Montey Hora Entered By: Montey Hora on 07/05/2015 10:17:27 Wiesman, [name] ([id number]) -------------------------------------------------------------------------------- Clinic Level of Care Assessment Details Patient Name: Jill Gip D. Date of Service: 07/05/2015 10:00 AM Medical Record Patient Account Number: [id number] Number: Treating RN: Montey Hora [birth year] (65 y.o. Other Clinician: Date of Birth/Sex: Female) Treating ROBSON, [place] Primary Care Physician: [facility], [place] Physician/Extender: G Referring Physician: Keith Rake Weeks in Treatment: 2 Clinic Level of Care Assessment Items TOOL 4 Quantity Score []  - Use when only an EandM is performed on FOLLOW-UP visit 0 ASSESSMENTS - Nursing Assessment / Reassessment X - Reassessment of Co-morbidities (includes updates in patient status) 1 10 X - Reassessment of Adherence to Treatment Plan 1 5 ASSESSMENTS -  Wound and Skin Assessment / Reassessment X - Simple Wound Assessment / Reassessment - one wound 1 5 []  - Complex Wound Assessment / Reassessment - multiple wounds 0 []  - Dermatologic / Skin Assessment (not related to wound area) 0 ASSESSMENTS - Focused Assessment []  - Circumferential Edema Measurements - multi extremities 0 []  - Nutritional Assessment / Counseling / Intervention 0 []  - Lower Extremity Assessment (monofilament, tuning fork, pulses) 0 []  - Peripheral Arterial Disease Assessment (using hand held doppler) 0 ASSESSMENTS - Ostomy and/or Continence Assessment and Care []  - Incontinence Assessment and Management 0 []  - Ostomy Care Assessment and Management (repouching, etc.) 0 PROCESS - Coordination of Care X - Simple Patient / Family Education for ongoing care 1 15 []  - Complex (extensive) Patient / Family Education for ongoing care 0 []  - Staff obtains Programmer, systems, Records, Test Results / Process Orders 0 []  - Staff telephones HHA, Nursing Homes / Clarify orders / etc 0 [name], [name]. ([id number]) []  - Routine Transfer to another Facility (non-emergent condition) 0 []  - Routine Hospital Admission (non-emergent condition) 0 []  - New Admissions / Biomedical engineer / Ordering NPWT, Apligraf, etc. 0 []  - Emergency Hospital Admission (emergent condition) 0 X - Simple Discharge Coordination 1 10 []  - Complex (extensive) Discharge Coordination 0 PROCESS - Special Needs []  - Pediatric / Minor Patient Management 0 []  - Isolation Patient Management 0 []  - Hearing / Language / Visual special needs 0 []  - Assessment of Community assistance (transportation, D/C planning, etc.) 0 []  - Additional assistance / Altered mentation 0 []  - Support Surface(s) Assessment (bed, cushion, seat, etc.) 0 INTERVENTIONS - Wound Cleansing / Measurement X - Simple Wound Cleansing - one wound 1 5 []  - Complex Wound Cleansing - multiple wounds 0 X - Wound Imaging (photographs - any number of wounds) 1  5 []  - Wound Tracing (instead of photographs) 0 X - Simple Wound Measurement - one wound 1  5 []  - Complex Wound Measurement - multiple wounds 0 INTERVENTIONS - Wound Dressings X - Small Wound Dressing one or multiple wounds 1 10 []  - Medium Wound Dressing one or multiple wounds 0 []  - Large Wound Dressing one or multiple wounds 0 []  - Application of Medications - topical 0 []  - Application of Medications - injection 0 [place], [name] ([id number]) INTERVENTIONS - Miscellaneous []  - External ear exam 0 []  - Specimen Collection (cultures, biopsies, blood, body fluids, etc.) 0 []  - Specimen(s) / Culture(s) sent or taken to Lab for analysis 0 []  - Patient Transfer (multiple staff / Harrel Lemon Lift / Similar devices) 0 []  - Simple Staple / Suture removal (25 or less) 0 []  - Complex Staple / Suture removal (26 or more) 0 []  - Hypo / Hyperglycemic Management (close monitor of Blood Glucose) 0 []  - Ankle / Brachial Index (ABI) - do not check if billed separately 0 X - Vital Signs 1 5 Has the patient been seen at the hospital within the last three years: Yes Total Score: 75 Level Of Care: New/Established - Level 2 Electronic Signature(s) Signed: 07/05/2015 3:16:30 PM By: Montey Hora Entered By: Montey Hora on 07/05/2015 10:29:17 L[place], [name] ([id number]) -------------------------------------------------------------------------------- Encounter Discharge Information Details Patient Name: Jill Gip D. Date of Service: 07/05/2015 10:00 AM Medical Record Patient Account Number: [id number] Number: Treating RN: Montey Hora 1950-12-25 (65 y.o. Other Clinician: Date of Birth/Sex: Female) Treating ROBSON, MICHAEL Primary Care Physician: [facility], [place] Physician/Extender: G Referring Physician: Keith Rake Weeks in Treatment: 2 Encounter Discharge Information Items Discharge Pain Level: 0 Discharge Condition: Stable Ambulatory Status: Ambulatory Discharge Destination:  Home Transportation: Private Auto Accompanied By: self Schedule Follow-up Appointment: Yes Medication Reconciliation completed and provided to Patient/Care No [name]: Provided on Clinical Summary of Care: 07/05/2015 Form Type Recipient Paper Patient [facility] Electronic Signature(s) Signed: 07/05/2015 10:32:14 AM By: Ruthine Dose Entered By: Ruthine Dose on 07/05/2015 10:32:14 [place], [name] ([id number]) -------------------------------------------------------------------------------- Multi Wound Chart Details Patient Name: Jill Gip D. Date of Service: 07/05/2015 10:00 AM Medical Record Patient Account Number: [id number] Number: Treating RN: Montey Hora [birth year] (65 y.o. Other Clinician: Date of Birth/Sex: Female) Treating ROBSON, MICHAEL Primary Care Physician: [facility], [place] Physician/Extender: G Referring Physician: [facility], [facility] Weeks in Treatment: 2 Vital Signs Height(in): 62 Pulse(bpm): 90 Weight(lbs): 175 Blood Pressure 127/56 (mmHg): Body Mass Index(BMI): 32 Temperature(F): 97.8 Respiratory Rate 18 (breaths/min): Photos: [1:No Photos] [N/A:N/A] Wound Location: [1:Right Gluteus] [N/A:N/A] Wounding Event: [1:Pressure Injury] [N/A:N/A] Primary Etiology: [1:Pressure Ulcer] [N/A:N/A] Comorbid History: [1:Cataracts, Hypertension, Type II Diabetes, Osteoarthritis] [N/A:N/A] Date Acquired: [1:04/21/2015] [N/A:N/A] Weeks of Treatment: [1:2] [N/A:N/A] Wound Status: [1:Open] [N/A:N/A] Measurements L x W x D 0.5x0.3x0.1 [N/A:N/A] (cm) Area (cm) : [1:0.118] [N/A:N/A] Volume (cm) : [1:0.012] [N/A:N/A] % Reduction in Area: [1:57.10%] [N/A:N/A] % Reduction in Volume: 55.60% [N/A:N/A] Classification: [1:Category/Stage II] [N/A:N/A] Exudate Amount: [1:Medium] [N/A:N/A] Exudate Type: [1:Serous] [N/A:N/A] Exudate Color: [1:amber] [N/A:N/A] Wound Margin: [1:Flat and Intact] [N/A:N/A] Granulation Amount: [1:Small (1-33%)] [N/A:N/A] Granulation Quality: [1:Pink]  [N/A:N/A] Necrotic Amount: [1:Small (1-33%)] [N/A:N/A] Epithelialization: [1:Small (1-33%)] [N/A:N/A] Periwound Skin Texture: Edema: No [1:Excoriation: No Induration: No] [N/A:N/A] Callus: No Crepitus: No Fluctuance: No Friable: No Rash: No Scarring: No Periwound Skin Maceration: No N/A N/A Moisture: Moist: No Dry/Scaly: No Periwound Skin Color: Atrophie Blanche: No N/A N/A Cyanosis: No Ecchymosis: No Erythema: No Hemosiderin Staining: No Mottled: No Pallor: No Rubor: No Tenderness on No N/A N/A Palpation: Wound Preparation: Ulcer Cleansing: N/A N/A Rinsed/Irrigated with Saline Topical Anesthetic Applied: None  Treatment Notes Electronic Signature(s) Signed: 07/05/2015 3:16:30 PM By: Montey Hora Entered By: Montey Hora on 07/05/2015 10:22:22 [name] ([id number]) -------------------------------------------------------------------------------- Multi-Disciplinary Care Plan Details Patient Name: [name], [name] Date of Service: 07/05/2015 10:00 AM Medical Record Patient Account Number: [id number] Number: Treating RN: Montey Hora April 14, 1950 (65 y.o. Other Clinician: Date of Birth/Sex: Female) Treating ROBSON, [place] Primary Care Physician: [facility], [place] Physician/Extender: G Referring Physician: Keith Rake Weeks in Treatment: 2 Active Inactive Nutrition Nursing Diagnoses: Imbalanced nutrition Goals: Patient/caregiver will maintain therapeutic glucose control Date Initiated: 06/21/2015 Goal Status: Active Interventions: Provide education on elevated blood sugars and impact on wound healing Notes: Orientation to the Wound Care Program Nursing Diagnoses: Knowledge deficit related to the wound healing center program Goals: Patient/caregiver will verbalize understanding of the [place] Program Date Initiated: 06/21/2015 Goal Status: Active Interventions: Provide education on orientation to the wound center Notes: Pressure Nursing  Diagnoses: Knowledge deficit related to management of pressures ulcers Goals: CHAREESE, VETH ([id number]) Patient will remain free of pressure ulcers Date Initiated: 06/21/2015 Goal Status: Active Interventions: Provide education on pressure ulcers Notes: Wound/Skin Impairment Nursing Diagnoses: Impaired tissue integrity Goals: Ulcer/skin breakdown will heal within 14 weeks Date Initiated: 06/21/2015 Goal Status: Active Interventions: Assess patient/caregiver ability to obtain necessary supplies Notes: Electronic Signature(s) Signed: 07/05/2015 3:16:30 PM By: Montey Hora Entered By: Montey Hora on 07/05/2015 10:22:13 [name], [name] ([id number]) -------------------------------------------------------------------------------- Pain Assessment Details Patient Name: Jill Gip D. Date of Service: 07/05/2015 10:00 AM Medical Record Patient Account Number: [id number] Number: Treating RN: Montey Hora 11-14-1950 (65 y.o. Other Clinician: Date of Birth/Sex: Female) Treating ROBSON, MICHAEL Primary Care Physician: [facility], [facility] Physician/Extender: G Referring Physician: Manuella Ghazi, [facility] Weeks in Treatment: 2 Active Problems Location of Pain Severity and Description of Pain Patient Has Paino Yes Site Locations Pain Location: Pain in Ulcers With Dressing Change: Yes Duration of the Pain. Constant / Intermittento Constant Rate the pain. Current Pain Level: 5 Pain Management and Medication Current Pain Management: Electronic Signature(s) Signed: 07/05/2015 3:16:30 PM By: Montey Hora Entered By: Montey Hora on 07/05/2015 10:18:16 [name], [name] ([id number]) -------------------------------------------------------------------------------- Patient/Caregiver Education Details Patient Name: Jill Gip D. Date of Service: 07/05/2015 10:00 AM Medical Record Patient Account Number: [id number] Number: Treating RN: Montey Hora [birth year] (65 y.o. Other  Clinician: Date of Birth/Gender: Female) Treating ROBSON, [place] Primary Care Physician: [facility] [place]: G Referring Physician: [facility] in Treatment: 2 Education Assessment Education Provided To: Patient Education Topics Provided Pressure: Handouts: Other: keep pressure off bottom Methods: Demonstration, Explain/Verbal Responses: State content correctly Electronic Signature(s) Signed: 07/05/2015 3:16:30 PM By: Montey Hora Entered By: Montey Hora on 07/05/2015 10:30:09 [name], [name] ([id number]) -------------------------------------------------------------------------------- Wound Assessment Details Patient Name: Jill Gip D. Date of Service: 07/05/2015 10:00 AM Medical Record Patient Account Number: [id number] Number: Treating RN: Montey Hora 1950/10/26 (65 y.o. Other Clinician: Date of Birth/Sex: Female) Treating ROBSON, MICHAEL Primary Care Physician: [facility], [facility] Physician/Extender: G Referring Physician: [facility], [facility] Weeks in Treatment: 2 Wound Status Wound Number: 1 Primary Pressure Ulcer Etiology: Wound Location: Right Gluteus Wound Open Wounding Event: Pressure Injury Status: Date Acquired: 04/21/2015 Comorbid Cataracts, Hypertension, Type II Weeks Of Treatment: 2 History: Diabetes, Osteoarthritis Clustered Wound: No Photos Photo Uploaded By: Montey Hora on 07/05/2015 10:50:24 Wound Measurements Length: (cm) 0.5 Width: (cm) 0.3 Depth: (cm) 0.1 Area: (cm) 0.118 Volume: (cm) 0.012 % Reduction in Area: 57.1% % Reduction in Volume: 55.6% Epithelialization: Small (1-33%) Tunneling: No Undermining: No Wound Description Classification: Category/Stage II  Wound Margin: Flat and Intact Exudate Amount: Medium Exudate Type: Serous Exudate Color: amber Wound Bed Granulation Amount: Small (1-33%) Granulation Quality: Pink Necrotic Amount: Small (1-33%) Weyers, Coreena D. ([id number]) Necrotic Quality: Adherent  Slough Periwound Skin Texture Texture Color No Abnormalities Noted: No No Abnormalities Noted: No Callus: No Atrophie Blanche: No Crepitus: No Cyanosis: No Excoriation: No Ecchymosis: No Fluctuance: No Erythema: No Friable: No Hemosiderin Staining: No Induration: No Mottled: No Localized Edema: No Pallor: No Rash: No Rubor: No Scarring: No Moisture No Abnormalities Noted: No Dry / Scaly: No Maceration: No Moist: No Wound Preparation Ulcer Cleansing: Rinsed/Irrigated with Saline Topical Anesthetic Applied: None Treatment Notes Wound #1 (Right Gluteus) 1. Cleansed with: Clean wound with Normal Saline 4. Dressing Applied: Aquacel Ag 5. Secondary Dressing Applied Bordered Foam Dressing Electronic Signature(s) Signed: 07/05/2015 3:16:30 PM By: Montey Hora Entered By: Montey Hora on 07/05/2015 10:22:03 HOLTVerneda [name] ([id number]) -------------------------------------------------------------------------------- Vitals Details Patient Name: Jill Gip D. Date of Service: 07/05/2015 10:00 AM Medical Record Patient Account Number: [id number] Number: Treating RN: Montey Hora [birth year] (65 y.o. Other Clinician: Date of Birth/Sex: Female) Treating ROBSON, [place] Primary Care Physician: [facility], [place]: G Referring Physician: [facility], [facility] Weeks in Treatment: 2 Vital Signs Time Taken: 10:19 Temperature (F): 97.8 Height (in): 62 Pulse (bpm): 90 Weight (lbs): 175 Respiratory Rate (breaths/min): 18 Body Mass Index (BMI): 32 Blood Pressure (mmHg): 127/56 Reference Range: 80 - 120 mg / dl Electronic Signature(s) Signed: 07/05/2015 3:16:30 PM By: Montey Hora Entered By: Montey Hora on 07/05/2015 10:19:41

## 2015-07-07 NOTE — Progress Notes (Signed)
[name] ([id number]) Visit Report for 07/05/2015 Chief Complaint Document Details Patient Name: [name], [name]. Date of Service: 07/05/2015 10:00 AM Medical Record Patient Account Number: [id number] Number: Treating RN: Montey Hora 07-Sep-1950 (65 y.o. Other Clinician: Date of Birth/Sex: Female) Treating Michaelangelo Mittelman Primary Care Physician/Extender: Wyline Beady, SYED Physician: Referring Physician: Keith Rake Weeks in Treatment: 2 Information Obtained from: Patient Chief Complaint Patient arrives for review of an advertised pressure ulcer on her right buttock Electronic Signature(s) Signed: 07/06/2015 5:25:36 PM By: Linton Ham MD Entered By: Linton Ham on 07/06/2015 07:58:56 [name], [name] ([id number]) -------------------------------------------------------------------------------- HPI Details Patient Name: [name], [name] Date of Service: 07/05/2015 10:00 AM Medical Record Patient Account Number: [id number] Number: Treating RN: Montey Hora 30-Nov-1950 (65 y.o. Other Clinician: Date of Birth/Sex: Female) Treating Chancelor Hardrick Primary Care Physician/Extender: Wyline Beady, SYED Physician: Referring Physician: Keith Rake Weeks in Treatment: 2 History of Present Illness HPI Description: 06/21/15; this is a 65 year old woman who arrives with a 2 to three-month history of what his average size this is stage II pressure ulcer on her right buttock. She is referred from her her OB/GYN who is been following her closely for Lichen sclerosis. She is been applying steroid cream to the wound on her buttock which is what is been prescribed for her chronic vulvar issues. Looking back through [facility] I can see reference of this to March 9 again at her gynecologist where she was listed as having an "boil of her buttock". She does not have a prior history of skin abscesses. She does have a history of psoriasis although this is been well controlled over the last  year with Cosentyx. In terms of the risk factors for pressure ulcers I really can't pick these up for. She is ambulatory. Does not give an easy history of being immobilized for a prolonged period of time/hard surfaces etc. she lives alone and seems to be reasonably independent. She had a recently elevated hemoglobin A1c of 6 but is not a known diabetic 06/28/15; culture I did of this area on her right buttock grew MRSA. This is raise my suspicion that this was initially a furuncle. It already however looks somewhat better with the Aquacel Ag 07/05/15; I think the area is better Electronic Signature(s) Signed: 07/06/2015 5:25:36 PM By: Linton Ham MD Entered By: Linton Ham on 07/06/2015 07:59:30 [name], [name] ([id number]) -------------------------------------------------------------------------------- Physical Exam Details Patient Name: [name], [name] Date of Service: 07/05/2015 10:00 AM Medical Record Patient Account Number: [id number] Number: Treating RN: Montey Hora Jan 22, 1951 (65 y.o. Other Clinician: Date of Birth/Sex: Female) Treating Verniece Encarnacion Primary Care Physician/Extender: Wyline Beady, SYED Physician: Referring Physician: Manuella Ghazi, SYED Weeks in Treatment: 2 Notes Wound exam; right buttock it appears to be smaller healthy-looking wound there is no surrounding erythema. Although the patient is tender to palpation, there is no appreciable subcutaneous involvement to suggest an underlying abscess, subcutaneous crepitus etc. Electronic Signature(s) Signed: 07/06/2015 5:25:36 PM By: Linton Ham MD Entered By: Linton Ham on 07/06/2015 08:00:21 [name] ([id number]) -------------------------------------------------------------------------------- Physician Orders Details Patient Name: [name], [name] Date of Service: 07/05/2015 10:00 AM Medical Record Patient Account Number: [id number] Number: Treating RN: Montey Hora 1951/01/08 (65 y.o.  Other Clinician: Date of Birth/Sex: Female) Treating Mekel Haverstock Primary Care Physician/Extender: Wyline Beady, SYED Physician: Referring Physician: Keith Rake Weeks in Treatment: 2 Verbal / Phone Orders: Yes Clinician: Dorthy, Joanna Read Back and Verified: Yes Diagnosis Coding Wound Cleansing Wound #1 Right Gluteus o  Clean wound with Normal Saline. Skin Barriers/Peri-Wound Care Wound #1 Right Gluteus o Barrier cream Primary Wound Dressing Wound #1 Right Gluteus o Aquacel Ag Secondary Dressing Wound #1 Right Gluteus o Boardered Foam Dressing Dressing Change Frequency Wound #1 Right Gluteus o Three times weekly Follow-up Appointments Wound #1 Right Gluteus o Return Appointment in 1 week. Additional Orders / Instructions Wound #1 Right Gluteus o Activity as tolerated o Other: - switch positions while sitting Medications-please add to medication list. Wound #1 Right Gluteus o P.O. Antibiotics - doxycycline [name], [name] ([id number]) Electronic Signature(s) Signed: 07/05/2015 3:16:30 PM By: Montey Hora Signed: 07/06/2015 5:25:36 PM By: Linton Ham MD Entered By: Montey Hora on 07/05/2015 10:28:46 [name], [name] ([id number]) -------------------------------------------------------------------------------- Problem List Details Patient Name: [name], [name]. Date of Service: 07/05/2015 10:00 AM Medical Record Patient Account Number: [id number] Number: Treating RN: Montey Hora 1950-10-21 (65 y.o. Other Clinician: Date of Birth/Sex: Female) Treating Dae Antonucci Primary Care Physician/Extender: Wyline Beady, SYED Physician: Referring Physician: Keith Rake Weeks in Treatment: 2 Active Problems ICD-10 Encounter Code Description Active Date Diagnosis L89.312 Pressure ulcer of right buttock, stage 2 06/21/2015 Yes Inactive Problems Resolved Problems Electronic Signature(s) Signed: 07/06/2015 5:25:36 PM By: Linton Ham MD Entered By:  Linton Ham on 07/06/2015 07:58:46 [name], [name] ([id number]) -------------------------------------------------------------------------------- Progress Note Details Patient Name: [name], [name] Date of Service: 07/05/2015 10:00 AM Medical Record Patient Account Number: [id number] Number: Treating RN: Montey Hora February 06, 1950 (65 y.o. Other Clinician: Date of Birth/Sex: Female) Treating Tiernan Millikin Primary Care Physician/Extender: Wyline Beady, SYED Physician: Referring Physician: Keith Rake Weeks in Treatment: 2 Subjective Chief Complaint Information obtained from Patient Patient arrives for review of an advertised pressure ulcer on her right buttock History of Present Illness (HPI) 06/21/15; this is a 65 year old woman who arrives with a 2 to three-month history of what his average size this is stage II pressure ulcer on her right buttock. She is referred from her her OB/GYN who is been following her closely for Lichen sclerosis. She is been applying steroid cream to the wound on her buttock which is what is been prescribed for her chronic vulvar issues. Looking back through [facility] I can see reference of this to March 9 again at her gynecologist where she was listed as having an "boil of her buttock". She does not have a prior history of skin abscesses. She does have a history of psoriasis although this is been well controlled over the last year with Cosentyx. In terms of the risk factors for pressure ulcers I really can't pick these up for. She is ambulatory. Does not give an easy history of being immobilized for a prolonged period of time/hard surfaces etc. she lives alone and seems to be reasonably independent. She had a recently elevated hemoglobin A1c of 6 but is not a known diabetic 06/28/15; culture I did of this area on her right buttock grew MRSA. This is raise my suspicion that this was initially a furuncle. It already however looks somewhat better with the  Aquacel Ag 07/05/15; I think the area is better Objective Constitutional Vitals Time Taken: 10:19 AM, Height: 62 in, Weight: 175 lbs, BMI: 32, Temperature: 97.8 F, Pulse: 90 bpm, Respiratory Rate: 18 breaths/min, Blood Pressure: 127/56 mmHg. Integumentary (Hair, Skin) Wound #1 status is Open. Original cause of wound was Pressure Injury. The wound is located on the Right Gluteus. The wound measures 0.5cm length x 0.3cm width x 0.1cm depth; 0.118cm^2 area and 0.012cm^3 [name], [name] ([id number])  volume. There is no tunneling or undermining noted. There is a medium amount of serous drainage noted. The wound margin is flat and intact. There is small (1-33%) pink granulation within the wound bed. There is a small (1-33%) amount of necrotic tissue within the wound bed including Adherent Slough. The periwound skin appearance did not exhibit: Callus, Crepitus, Excoriation, Fluctuance, Friable, Induration, Localized Edema, Rash, Scarring, Dry/Scaly, Maceration, Moist, Atrophie Blanche, Cyanosis, Ecchymosis, Hemosiderin Staining, Mottled, Pallor, Rubor, Erythema. Assessment Active Problems ICD-10 L89.312 - Pressure ulcer of right buttock, stage 2 Plan Wound Cleansing: Wound #1 Right Gluteus: Clean wound with Normal Saline. Skin Barriers/Peri-Wound Care: Wound #1 Right Gluteus: Barrier cream Primary Wound Dressing: Wound #1 Right Gluteus: Aquacel Ag Secondary Dressing: Wound #1 Right Gluteus: Boardered Foam Dressing Dressing Change Frequency: Wound #1 Right Gluteus: Three times weekly Follow-up Appointments: Wound #1 Right Gluteus: Return Appointment in 1 week. Additional Orders / Instructions: Wound #1 Right Gluteus: Activity as tolerated Other: - switch positions while sitting Medications-please add to medication list.: Wound #1 Right Gluteus: P.O. Antibiotics - doxycycline [name], [name] ([id number]) #1 Aquacel Ag and border foam to continue. No need for additional antibiotics I  think this is progressing towards closure #2 discussed offloading which the patient seems to be compliant with Electronic Signature(s) Signed: 07/06/2015 5:25:36 PM By: Linton Ham MD Entered By: Linton Ham on 07/06/2015 08:01:13 [name], [name] ([id number]) -------------------------------------------------------------------------------- SuperBill Details Patient Name: [name] Date of Service: 07/05/2015 Medical Record Patient Account Number: [id number] Number: Treating RN: Montey Hora 02-04-1951 (65 y.o. Other Clinician: Date of Birth/Sex: Female) Treating Aseel Uhde Primary Care Physician/Extender: Wyline Beady, SYED Physician: Suella [place] in Treatment: 2 Referring Physician: [facility], SYED Diagnosis Coding ICD-10 Codes Code Description [phone] Pressure ulcer of right buttock, stage 2 Facility Procedures CPT4 Code: [id number] Description: [phone] - WOUND CARE VISIT-LEV 2 EST PT Modifier: Quantity: 1 Physician Procedures CPT4 Code: [id number] Description: [id number] - WC PHYS LEVEL 2 - EST PT ICD-10 Description Diagnosis L89.312 Pressure ulcer of right buttock, stage 2 Modifier: Quantity: 1 Electronic Signature(s) Signed: 07/06/2015 5:25:36 PM By: Linton Ham MD Entered By: Linton Ham on 07/06/2015 08:01:46

## 2015-07-11 ENCOUNTER — Telehealth: Payer: Self-pay | Admitting: Family Medicine

## 2015-07-11 DIAGNOSIS — E039 Hypothyroidism, unspecified: Secondary | ICD-10-CM

## 2015-07-11 MED ORDER — LEVOTHYROXINE SODIUM 100 MCG PO TABS: 100.0000 ug | ORAL_TABLET | Freq: Every day | ORAL | Status: DC

## 2015-07-11 NOTE — Telephone Encounter (Signed)
Tried calling pt to inform but no answer.

## 2015-07-11 NOTE — Telephone Encounter (Signed)
Pt needs refill on Synthroid to be sent to [name]

## 2015-07-11 NOTE — Telephone Encounter (Signed)
Prescription for Synthroid has been sent to pharmacy

## 2015-07-11 NOTE — Telephone Encounter (Signed)
Pt informed of refill. °

## 2015-07-12 ENCOUNTER — Encounter: Payer: PPO | Attending: Internal Medicine | Admitting: Internal Medicine

## 2015-07-12 ENCOUNTER — Ambulatory Visit: Payer: Self-pay | Admitting: Obstetrics and Gynecology

## 2015-07-12 DIAGNOSIS — L89312 Pressure ulcer of right buttock, stage 2: Secondary | ICD-10-CM | POA: Insufficient documentation

## 2015-07-13 ENCOUNTER — Ambulatory Visit (INDEPENDENT_AMBULATORY_CARE_PROVIDER_SITE_OTHER): Payer: PPO | Admitting: Urology

## 2015-07-13 VITALS — BP 121/76 | HR 81 | Ht 62.5 in | Wt 177.2 lb

## 2015-07-13 DIAGNOSIS — N3946 Mixed incontinence: Secondary | ICD-10-CM

## 2015-07-13 NOTE — Progress Notes (Unsigned)
07/13/2015 4:44 PM   [name] 08-Oct-1950 [id number]  Referring provider: Roselee Nova, MD [address] [place], [place] [address]  Chief Complaint  Patient presents with  . Follow-up    consult for interstim removal    HPI: The patient by the referral note hasn't InterStim device inserted by Dr. Davis Gourd. I did note some of her history included panic attacks etc.  The patient had an InterStim year 2010. It's close to remove midline and hurt. She thinks it has not worked for 1 year. The patient reports that her flow is currently good. She sometimes gets up once at night. In voids every 2 hours. She wears 2-3 light pads per day and denies urge incontinence.  She says she does not feel stimulation and she thinks the device is off  He says the device is sore. He said his been painful for months or yearsModifying factors:  GU: No CVA tenderness. Tender over InterStim device with no cellulitis or infection. Device close to midline on left side  Assessment & Plan: The patient is device was actually on and we turned it off. She knows how to turn it on.  I will reassess in the next 1-2 weeks in the next time in clinic. I do not want to remove the device was truly helping her. If she needs a device I will then discuss pain. If she does not need the device it will be removed. I did not give her narcotics today when she asked me how to treat the pain. I recommended when necessary Advil  PMH: Past Medical History  Diagnosis Date  . GERD (gastroesophageal reflux disease)   . Hypertension   . Chronic back pain   . Anxiety   . Depression   . Thyroid disease   . Psoriasis   . Pneumonia     RECENT X 3  . Arthritis     Surgical History: Past Surgical History  Procedure Laterality Date  . Bladder stimulator    . Hand surgery Right   . Oophorectomy Right     ovarian cyst  . Abdominal hysterectomy      heavy bleeding  . Cataract extraction w/phaco Right  03/07/2015    Procedure: CATARACT EXTRACTION PHACO AND INTRAOCULAR LENS PLACEMENT (IOC);  Surgeon: Estill Cotta, MD;  Location: [facility];  Service: Ophthalmology;  Laterality: Right;  [name] 01:14 AP% 26.4 CDE 33.04 fluid pack lot # [id number]  . Cataract extraction w/phaco Left 03/21/2015    Procedure: CATARACT EXTRACTION PHACO AND INTRAOCULAR LENS PLACEMENT (IOC);  Surgeon: Estill Cotta, MD;  Location: [facility] OR[facility];  Service: Ophthalmology;  Laterality: Left;  [name] 01:21 AP% 25.6 CDE 39.78 fluid pack lot # [id number]    Home Medications:    Medication List       This list is accurate as of: 07/13/15  4:44 PM.  Always use your most recent med list.               acetaminophen 500 MG tablet  Commonly known as:  TYLENOL  Take 500 mg by mouth every 6 (six) hours as needed. Reported on 07/01/2015     ALPRAZolam 0.5 MG tablet  Commonly known as:  XANAX  Take 1 tablet (0.5 mg total) by mouth 3 (three) times daily as needed for anxiety.     ALPRAZolam 1 MG tablet  Commonly known as:  XANAX  Take 1 tablet (1 mg total) by mouth at bedtime as needed for anxiety.  amLODipine 5 MG tablet  Commonly known as:  NORVASC  Take 1 tablet (5 mg total) by mouth daily.     citalopram 20 MG tablet  Commonly known as:  CELEXA  Take 1 tablet (20 mg total) by mouth daily.     clobetasol ointment 0.05 %  Commonly known as:  TEMOVATE  Apply 1 application topically daily. Use twice a day for 2 weeks, then daily for 4 weeks.     COSENTYX [place]  Inject into the skin every 30 (thirty) days.     doxycycline 100 MG capsule  Commonly known as:  VIBRAMYCIN     levothyroxine 100 MCG tablet  Commonly known as:  SYNTHROID, LEVOTHROID  Take 1 tablet (100 mcg total) by mouth daily before breakfast.     lisinopril 10 MG tablet  Commonly known as:  PRINIVIL,ZESTRIL  Take 0.5 tablets (5 mg total) by mouth every morning. Take 5 mg every morning.     Vitamin D (Ergocalciferol) 50000 units Caps capsule    Commonly known as:  DRISDOL  Take 1 capsule (50,000 Units total) by mouth once a week. For 12 weeks        Allergies:  Allergies  Allergen Reactions  . Ciprofloxacin Hives and Diarrhea  . Oxycodone Diarrhea  . Oxycodone-Acetaminophen Diarrhea  . Sulfa Antibiotics Rash    Throat, mouth    Family History: Family History  Problem Relation Age of Onset  . Heart disease Mother   . COPD Father     Social History:  reports that she has been smoking Cigarettes.  She has been smoking about 1.00 pack per day. She does not have any smokeless tobacco history on file. She reports that she does not drink alcohol or use illicit drugs.  ROS:                                        Physical Exam: BP 121/76 mmHg  Pulse 81  Ht 5' 2.5" (1.588 m)  Wt 177 lb 3.2 oz (80.377 kg)  BMI 31.87 kg/m2    Laboratory Data: Lab Results  Component Value Date   WBC 7.4 06/24/2015   HGB 14.8 06/22/2014   HCT 46.6 06/24/2015   MCV 93 06/24/2015   PLT 307 06/24/2015    Lab Results  Component Value Date   CREATININE 0.97 06/24/2015    No results found for: PSA  No results found for: TESTOSTERONE  Lab Results  Component Value Date   HGBA1C 6.0* 05/12/2015    Urinalysis    Component Value Date/Time   COLORURINE YELLOW* 06/22/2014 1634   COLORURINE Yellow 10/10/2011 1219   APPEARANCEUR HAZY* 06/22/2014 1634   APPEARANCEUR Cloudy 10/10/2011 1219   LABSPEC 1.020 06/22/2014 1634   LABSPEC 1.012 10/10/2011 1219   PHURINE 5.0 06/22/2014 1634   PHURINE 5.0 10/10/2011 [place] 06/22/2014 1634   GLUCOSEU Negative 10/10/2011 1219   HGBUR NEGATIVE 06/22/2014 1634   HGBUR Negative 10/10/2011 1219   BILIRUBINUR NEGATIVE 06/22/2014 1634   BILIRUBINUR Negative 10/10/2011 [place] 06/22/2014 1634   KETONESUR Negative 10/10/2011 1219   PROTEINUR 30* 06/22/2014 1634   PROTEINUR Negative 10/10/2011 1219   NITRITE NEGATIVE 06/22/2014 1634    NITRITE Positive 10/10/2011 1219   LEUKOCYTESUR 2+* 06/22/2014 1634   LEUKOCYTESUR 2+ 10/10/2011 1219    Pertinent Imaging:  Assessment & Plan:  Patient has  had the device turned off [place] voiding well. I reexamined her and she still has pain over the IPG especially when she walks. I went over the pros cons and risks of removing the device and risk of retained tip. I think is a good idea for her to proceed and hopefully this will help her pain. The chance of ongoing pain was also discussed.   Reece Packer, MD  [facility] [address], [place], [place] [address] [phone]

## 2015-07-13 NOTE — Progress Notes (Signed)
CHARRIE, EIKEN ([id number]) Visit Report for 07/12/2015 Chief Complaint Document Details Patient Name: RATASHA, FICKEN. Date of Service: 07/12/2015 10:45 AM Medical Record Patient Account Number: [id number] Number: Treating RN: Montey Hora August 17, 1950 (65 y.o. Other Clinician: Date of Birth/Sex: Female) Treating ROBSON, MICHAEL Primary Care Physician/Extender: Wyline Beady, SYED Physician: Referring Physician: Keith Rake Weeks in Treatment: 3 Information Obtained from: Patient Chief Complaint Patient arrives for review of an advertised pressure ulcer on her right buttock Electronic Signature(s) Signed: 07/13/2015 7:56:34 AM By: Linton Ham MD Entered By: Linton Ham on 07/12/2015 11:16:46 Pettitt, Alyss DMarland Kitchen ([id number]) -------------------------------------------------------------------------------- HPI Details Patient Name: KENSINGTON, LUCKENBACH D. Date of Service: 07/12/2015 10:45 AM Medical Record Patient Account Number: [id number] Number: Treating RN: Montey Hora [birth year] (65 y.o. Other Clinician: Date of Birth/Sex: Female) Treating ROBSON, MICHAEL Primary Care Physician/Extender: Wyline Beady, SYED Physician: Referring Physician: Keith Rake Weeks in Treatment: 3 History of Present Illness HPI Description: 06/21/15; this is a 65 year old woman who arrives with a 2 to three-month history of what his average size this is stage II pressure ulcer on her right buttock. She is referred from her her OB/GYN who is been following her closely for Lichen sclerosis. She is been applying steroid cream to the wound on her buttock which is what is been prescribed for her chronic vulvar issues. Looking back through [facility] I can see reference of this to March 9 again at her gynecologist where she was listed as having an "boil of her buttock". She does not have a prior history of skin abscesses. She does have a history of psoriasis although this is been well controlled over the last  year with Cosentyx. In terms of the risk factors for pressure ulcers I really can't pick these up for. She is ambulatory. Does not give an easy history of being immobilized for a prolonged period of time/hard surfaces etc. she lives alone and seems to be reasonably independent. She had a recently elevated hemoglobin A1c of 6 but is not a known diabetic 06/28/15; culture I did of this area on her right buttock grew MRSA. This is raise my suspicion that this was initially a furuncle. It already however looks somewhat better with the Aquacel Ag 07/05/15; I think the area is better 07/12/15; the area is just about closed. She is still complaining of pain in the area Electronic Signature(s) Signed: 07/13/2015 7:56:34 AM By: Linton Ham MD Entered By: Linton Ham on 07/12/2015 11:17:20 Kiner, Verneda Skill ([id number]) -------------------------------------------------------------------------------- Physical Exam Details Patient Name: REISA, FUNK D. Date of Service: 07/12/2015 10:45 AM Medical Record Patient Account Number: [id number] Number: Treating RN: Montey Hora [birth year] (65 y.o. Other Clinician: Date of Birth/Sex: Female) Treating ROBSON, MICHAEL Primary Care Physician/Extender: Wyline Beady, SYED Physician: Referring Physician: Manuella Ghazi, SYED Weeks in Treatment: 3 Notes Wound exam; this almost appears to be fully epithelialized. Although she complains of discomfort I can detect no subcutaneous involvement. No evidence of an infection or cellulitis. This is just about closed over Electronic Signature(s) Signed: 07/13/2015 7:56:34 AM By: Linton Ham MD Entered By: Linton Ham on 07/12/2015 11:18:12 [place], Verneda Skill ([id number]) -------------------------------------------------------------------------------- Physician Orders Details Patient Name: [name] Date of Service: 07/12/2015 10:45 AM Medical Record Patient Account Number: [id number] Number: Treating RN: Cornell Barman December 03, 1950 (65 y.o. Other Clinician: Date of Birth/Sex: Female) Treating ROBSON, MICHAEL Primary Care Physician/Extender: Wyline Beady, SYED Physician: Referring Physician: Keith Rake Weeks in Treatment: 3 Verbal / Phone Orders: Yes Clinician: Cornell Barman Read Back  and Verified: Yes Diagnosis Coding Wound Cleansing Wound #1 Right Gluteus o Clean wound with Normal Saline. Skin Barriers/Peri-Wound Care Wound #1 Right Gluteus o Barrier cream Primary Wound Dressing Wound #1 Right Gluteus o Aquacel Ag Secondary Dressing Wound #1 Right Gluteus o Boardered Foam Dressing Dressing Change Frequency Wound #1 Right Gluteus o Three times weekly Follow-up Appointments Wound #1 Right Gluteus o Return Appointment in 1 week. Additional Orders / Instructions Wound #1 Right Gluteus o Activity as tolerated o Other: - switch positions while sitting Medications-please add to medication list. Wound #1 Right Gluteus o P.O. Antibiotics - doxycycline ILO, HARTE ([id number]) Electronic Signature(s) Signed: 07/12/2015 4:33:07 PM By: Gretta Cool RN, BSN, Kim RN, BSN Signed: 07/13/2015 7:56:34 AM By: Linton Ham MD Entered By: Gretta Cool RN, BSN, Kim on 07/12/2015 11:07:43 CECILA, HACKBARTH ([id number]) -------------------------------------------------------------------------------- Problem List Details Patient Name: SHEREESE, KAERCHER. Date of Service: 07/12/2015 10:45 AM Medical Record Patient Account Number: [id number] Number: Treating RN: Montey Hora May 23, 1950 (65 y.o. Other Clinician: Date of Birth/Sex: Female) Treating ROBSON, MICHAEL Primary Care Physician/Extender: Wyline Beady, SYED Physician: Referring Physician: Keith Rake Weeks in Treatment: 3 Active Problems ICD-10 Encounter Code Description Active Date Diagnosis L89.312 Pressure ulcer of right buttock, stage 2 06/21/2015 Yes Inactive Problems Resolved Problems Electronic Signature(s) Signed: 07/13/2015 7:56:34 AM By:  Linton Ham MD Entered By: Linton Ham on 07/12/2015 11:16:24 Lavallie, Verneda Skill ([id number]) -------------------------------------------------------------------------------- Progress Note Details Patient Name: ILAMAE, KISHBAUGH D. Date of Service: 07/12/2015 10:45 AM Medical Record Patient Account Number: [id number] Number: Treating RN: Montey Hora 1950/12/18 (65 y.o. Other Clinician: Date of Birth/Sex: Female) Treating ROBSON, MICHAEL Primary Care Physician/Extender: Wyline Beady, SYED Physician: Referring Physician: Keith Rake Weeks in Treatment: 3 Subjective Chief Complaint Information obtained from Patient Patient arrives for review of an advertised pressure ulcer on her right buttock History of Present Illness (HPI) 06/21/15; this is a 64 year old woman who arrives with a 2 to three-month history of what his average size this is stage II pressure ulcer on her right buttock. She is referred from her her OB/GYN who is been following her closely for Lichen sclerosis. She is been applying steroid cream to the wound on her buttock which is what is been prescribed for her chronic vulvar issues. Looking back through [facility] I can see reference of this to March 9 again at her gynecologist where she was listed as having an "boil of her buttock". She does not have a prior history of skin abscesses. She does have a history of psoriasis although this is been well controlled over the last year with Cosentyx. In terms of the risk factors for pressure ulcers I really can't pick these up for. She is ambulatory. Does not give an easy history of being immobilized for a prolonged period of time/hard surfaces etc. she lives alone and seems to be reasonably independent. She had a recently elevated hemoglobin A1c of 6 but is not a known diabetic 06/28/15; culture I did of this area on her right buttock grew MRSA. This is raise my suspicion that this was initially a furuncle. It already however  looks somewhat better with the Aquacel Ag 07/05/15; I think the area is better 07/12/15; the area is just about closed. She is still complaining of pain in the area Objective Constitutional Vitals Time Taken: 10:51 AM, Height: 62 in, Weight: 175 lbs, BMI: 32, Temperature: 97.7 F, Pulse: 92 bpm, Respiratory Rate: 18 breaths/min, Blood Pressure: 133/62 mmHg. Integumentary (Hair, Skin) Wound #1 status is Open.  Original cause of wound was Pressure Injury. The wound is located on the Right Casimir, Selia D. ([id number]) Gluteus. The wound measures 0.4cm length x 0.2cm width x 0.1cm depth; 0.063cm^2 area and 0.006cm^3 volume. The wound is limited to skin breakdown. There is no tunneling or undermining noted. There is a medium amount of serous drainage noted. The wound margin is flat and intact. There is small (1-33%) pink granulation within the wound bed. There is a small (1-33%) amount of necrotic tissue within the wound bed including Adherent Slough. The periwound skin appearance exhibited: Moist. The periwound skin appearance did not exhibit: Callus, Crepitus, Excoriation, Fluctuance, Friable, Induration, Localized Edema, Rash, Scarring, Dry/Scaly, Maceration, Atrophie Blanche, Cyanosis, Ecchymosis, Hemosiderin Staining, Mottled, Pallor, Rubor, Erythema. Assessment Active Problems ICD-10 L89.312 - Pressure ulcer of right buttock, stage 2 Plan Wound Cleansing: Wound #1 Right Gluteus: Clean wound with Normal Saline. Skin Barriers/Peri-Wound Care: Wound #1 Right Gluteus: Barrier cream Primary Wound Dressing: Wound #1 Right Gluteus: Aquacel Ag Secondary Dressing: Wound #1 Right Gluteus: Boardered Foam Dressing Dressing Change Frequency: Wound #1 Right Gluteus: Three times weekly Follow-up Appointments: Wound #1 Right Gluteus: Return Appointment in 1 week. Additional Orders / Instructions: Wound #1 Right Gluteus: Activity as tolerated Other: - switch positions while  sitting Medications-please add to medication list.: Wound #1 Right Gluteus: Danford, Leinani D. ([id number]) P.O. Antibiotics - doxycycline #1 I see no reason to change the current dressing which is Aquacel Ag border foam. I think this should be closed by next week. Electronic Signature(s) Signed: 07/13/2015 7:56:34 AM By: Linton Ham MD Entered By: Linton Ham on 07/12/2015 11:19:10 Deakin, Verneda Skill ([id number]) -------------------------------------------------------------------------------- SuperBill Details Patient Name: [name] Date of Service: 07/12/2015 Medical Record Patient Account Number: [id number] Number: Treating RN: Cornell Barman 10-14-1950 (65 y.o. Other Clinician: Date of Birth/Sex: Female) Treating ROBSON, MICHAEL Primary Care Physician/Extender: Wyline Beady, SYED Physician: Suella [place] in Treatment: 3 Referring Physician: [facility], SYED Diagnosis Coding ICD-10 Codes Code Description [phone] Pressure ulcer of right buttock, stage 2 Facility Procedures CPT4 Code: [id number] Description: [id number] - WOUND CARE VISIT-LEV 2 EST PT Modifier: Quantity: 1 Electronic Signature(s) Signed: 07/13/2015 7:56:34 AM By: Linton Ham MD Entered By: Linton Ham on 07/12/2015 11:19:21

## 2015-07-13 NOTE — Progress Notes (Signed)
Jill, [name] ([id number]) Visit Report for 07/12/2015 Arrival Information Details Patient Name: [name], [name]. Date of Service: 07/12/2015 10:45 AM Medical Record Patient Account Number: [id number] Number: Treating RN: Cornell Barman 12-Jan-1951 (65 y.o. Other Clinician: Date of Birth/Sex: Female) Treating ROBSON, MICHAEL Primary Care Physician: [facility], [facility] Physician/Extender: G Referring Physician: Keith Rake Weeks in Treatment: 3 Visit Information History Since Last Visit Added or deleted any medications: No Patient Arrived: Ambulatory Any new allergies or adverse reactions: No Arrival Time: 10:50 Signs or symptoms of abuse/neglect since last No Accompanied By: self visito Transfer Assistance: None Hospitalized since last visit: No Patient Identification Verified: Yes Pain Present Now: No Secondary Verification Process Yes Completed: Electronic Signature(s) Signed: 07/12/2015 4:33:07 PM By: Gretta Cool, RN, BSN, Kim RN, BSN Entered By: Gretta Cool, RN, BSN, Kim on 07/12/2015 10:50:39 Honda, Verneda Skill ([id number]) -------------------------------------------------------------------------------- Clinic Level of Care Assessment Details Patient Name: Jill, [name]. Date of Service: 07/12/2015 10:45 AM Medical Record Patient Account Number: [id number] Number: Treating RN: Cornell Barman [birth year] (65 y.o. Other Clinician: Date of Birth/Sex: Female) Treating ROBSON, [place]bury Primary Care Physician: [facility], [facility] Physician/Extender: G Referring Physician: Keith Rake Weeks in Treatment: 3 Clinic Level of Care Assessment Items TOOL 4 Quantity Score []  - Use when only an EandM is performed on FOLLOW-UP visit 0 ASSESSMENTS - Nursing Assessment / Reassessment []  - Reassessment of Co-morbidities (includes updates in patient status) 0 X - Reassessment of Adherence to Treatment Plan 1 5 ASSESSMENTS - Wound and Skin Assessment / Reassessment X - Simple Wound Assessment / Reassessment - one wound 1  5 []  - Complex Wound Assessment / Reassessment - multiple wounds 0 []  - Dermatologic / Skin Assessment (not related to wound area) 0 ASSESSMENTS - Focused Assessment []  - Circumferential Edema Measurements - multi extremities 0 []  - Nutritional Assessment / Counseling / Intervention 0 []  - Lower Extremity Assessment (monofilament, tuning fork, pulses) 0 []  - Peripheral Arterial Disease Assessment (using hand held doppler) 0 ASSESSMENTS - Ostomy and/or Continence Assessment and Care []  - Incontinence Assessment and Management 0 []  - Ostomy Care Assessment and Management (repouching, etc.) 0 PROCESS - Coordination of Care X - Simple Patient / Family Education for ongoing care 1 15 []  - Complex (extensive) Patient / Family Education for ongoing care 0 []  - Staff obtains Programmer, systems, Records, Test Results / Process Orders 0 []  - Staff telephones HHA, Nursing Homes / Clarify orders / etc 0 TALEEN, DICKARD. ([id number]) []  - Routine Transfer to another Facility (non-emergent condition) 0 []  - Routine Hospital Admission (non-emergent condition) 0 []  - New Admissions / Biomedical engineer / Ordering NPWT, Apligraf, etc. 0 []  - Emergency Hospital Admission (emergent condition) 0 X - Simple Discharge Coordination 1 10 []  - Complex (extensive) Discharge Coordination 0 PROCESS - Special Needs []  - Pediatric / Minor Patient Management 0 []  - Isolation Patient Management 0 []  - Hearing / Language / Visual special needs 0 []  - Assessment of Community assistance (transportation, D/C planning, etc.) 0 []  - Additional assistance / Altered mentation 0 []  - Support Surface(s) Assessment (bed, cushion, seat, etc.) 0 INTERVENTIONS - Wound Cleansing / Measurement X - Simple Wound Cleansing - one wound 1 5 []  - Complex Wound Cleansing - multiple wounds 0 X - Wound Imaging (photographs - any number of wounds) 1 5 []  - Wound Tracing (instead of photographs) 0 X - Simple Wound Measurement - one wound 1 5 []  -  Complex Wound Measurement - multiple wounds 0 INTERVENTIONS - Wound  Dressings X - Small Wound Dressing one or multiple wounds 1 10 []  - Medium Wound Dressing one or multiple wounds 0 []  - Large Wound Dressing one or multiple wounds 0 []  - Application of Medications - topical 0 []  - Application of Medications - injection 0 Vanwey, Destony D. ([id number]) INTERVENTIONS - Miscellaneous []  - External ear exam 0 []  - Specimen Collection (cultures, biopsies, blood, body fluids, etc.) 0 []  - Specimen(s) / Culture(s) sent or taken to Lab for analysis 0 []  - Patient Transfer (multiple staff / Harrel Lemon Lift / Similar devices) 0 []  - Simple Staple / Suture removal (25 or less) 0 []  - Complex Staple / Suture removal (26 or more) 0 []  - Hypo / Hyperglycemic Management (close monitor of Blood Glucose) 0 []  - Ankle / Brachial Index (ABI) - do not check if billed separately 0 X - Vital Signs 1 5 Has the patient been seen at the hospital within the last three years: Yes Total Score: 65 Level Of Care: New/Established - Level 2 Electronic Signature(s) Signed: 07/12/2015 4:33:07 PM By: Gretta Cool, RN, BSN, Kim RN, BSN Entered By: Gretta Cool, RN, BSN, Kim on 07/12/2015 11:08:08 Jamal Maes ([id number]) -------------------------------------------------------------------------------- Encounter Discharge Information Details Patient Name: Jill, [name]. Date of Service: 07/12/2015 10:45 AM Medical Record Patient Account Number: [id number] Number: Treating RN: Montey Hora November 23, 1950 (65 y.o. Other Clinician: Date of Birth/Sex: Female) Treating ROBSON, MICHAEL Primary Care Physician: [facility], [facility] Physician/Extender: G Referring Physician: Keith Rake Weeks in Treatment: 3 Encounter Discharge Information Items Schedule Follow-up Appointment: No Medication Reconciliation completed No and provided to Patient/Care Dishon Kehoe: Provided on Clinical Summary of Care: 07/12/2015 Form Type Recipient Paper Patient  [facility] Electronic Signature(s) Signed: 07/12/2015 11:08:40 AM By: Ruthine Dose Entered By: Ruthine Dose on 07/12/2015 11:08:39 Llewellyn, Randye D. ([id number]) -------------------------------------------------------------------------------- Multi Wound Chart Details Patient Name: [name] Date of Service: 07/12/2015 10:45 AM Medical Record Patient Account Number: [id number] Number: Treating RN: Cornell Barman 24-Apr-1950 (65 y.o. Other Clinician: Date of Birth/Sex: Female) Treating ROBSON, MICHAEL Primary Care Physician: [facility], [facility] Physician/Extender: G Referring Physician: [facility], [facility] Weeks in Treatment: 3 Vital Signs Height(in): 62 Pulse(bpm): 92 Weight(lbs): 175 Blood Pressure 133/62 (mmHg): Body Mass Index(BMI): 32 Temperature(F): 97.7 Respiratory Rate 18 (breaths/min): Photos: [1:No Photos] [N/A:N/A] Wound Location: [1:Right Gluteus] [N/A:N/A] Wounding Event: [1:Pressure Injury] [N/A:N/A] Primary Etiology: [1:Pressure Ulcer] [N/A:N/A] Comorbid History: [1:Cataracts, Hypertension, Type II Diabetes, Osteoarthritis] [N/A:N/A] Date Acquired: [1:04/21/2015] [N/A:N/A] Weeks of Treatment: [1:3] [N/A:N/A] Wound Status: [1:Open] [N/A:N/A] Measurements L x W x D 0.4x0.2x0.1 [N/A:N/A] (cm) Area (cm) : [1:0.063] [N/A:N/A] Volume (cm) : [1:0.006] [N/A:N/A] % Reduction in Area: [1:77.10%] [N/A:N/A] % Reduction in Volume: 77.80% [N/A:N/A] Classification: [1:Category/Stage II] [N/A:N/A] Exudate Amount: [1:Medium] [N/A:N/A] Exudate Type: [1:Serous] [N/A:N/A] Exudate Color: [1:amber] [N/A:N/A] Wound Margin: [1:Flat and Intact] [N/A:N/A] Granulation Amount: [1:Small (1-33%)] [N/A:N/A] Granulation Quality: [1:Pink] [N/A:N/A] Necrotic Amount: [1:Small (1-33%)] [N/A:N/A] Exposed Structures: [1:Fascia: No Fat: No Tendon: No Muscle: No] [N/A:N/A] Joint: No Bone: No Limited to Skin Breakdown Epithelialization: Small (1-33%) N/A N/A Periwound Skin Texture: Edema: No N/A  N/A Excoriation: No Induration: No Callus: No Crepitus: No Fluctuance: No Friable: No Rash: No Scarring: No Periwound Skin Moist: Yes N/A N/A Moisture: Maceration: No Dry/Scaly: No Periwound Skin Color: Atrophie Blanche: No N/A N/A Cyanosis: No Ecchymosis: No Erythema: No Hemosiderin Staining: No Mottled: No Pallor: No Rubor: No Tenderness on No N/A N/A Palpation: Wound Preparation: Ulcer Cleansing: N/A N/A Rinsed/Irrigated with Saline Topical Anesthetic Applied: None Treatment Notes Electronic Signature(s) Signed:  07/12/2015 4:33:07 PM By: Gretta Cool, RN, BSN, Kim RN, BSN Entered By: Gretta Cool, RN, BSN, Kim on 07/12/2015 10:53:33 Jamal Maes ([id number]) -------------------------------------------------------------------------------- Multi-Disciplinary Care Plan Details Patient Name: ALANTE, REATEGUI. Date of Service: 07/12/2015 10:45 AM Medical Record Patient Account Number: [id number] Number: Treating RN: Cornell Barman 1951/01/08 (65 y.o. Other Clinician: Date of Birth/Sex: Female) Treating ROBSON, MICHAEL Primary Care Physician: [facility], [facility] Physician/Extender: G Referring Physician: Keith Rake Weeks in Treatment: 3 Active Inactive Nutrition Nursing Diagnoses: Imbalanced nutrition Goals: Patient/caregiver will maintain therapeutic glucose control Date Initiated: 06/21/2015 Goal Status: Active Interventions: Provide education on elevated blood sugars and impact on wound healing Notes: Orientation to the Wound Care Program Nursing Diagnoses: Knowledge deficit related to the wound healing center program Goals: Patient/caregiver will verbalize understanding of the [facility] Date Initiated: 06/21/2015 Goal Status: Active Interventions: Provide education on orientation to the wound center Notes: Pressure Nursing Diagnoses: Knowledge deficit related to management of pressures ulcers Goals: NIVIA, ALPAUGH ([id number]) Patient will remain free  of pressure ulcers Date Initiated: 06/21/2015 Goal Status: Active Interventions: Provide education on pressure ulcers Notes: Wound/Skin Impairment Nursing Diagnoses: Impaired tissue integrity Goals: Ulcer/skin breakdown will heal within 14 weeks Date Initiated: 06/21/2015 Goal Status: Active Interventions: Assess patient/caregiver ability to obtain necessary supplies Notes: Electronic Signature(s) Signed: 07/12/2015 4:33:07 PM By: Gretta Cool, RN, BSN, Kim RN, BSN Entered By: Gretta Cool, RN, BSN, Kim on 07/12/2015 10:53:27 [place], Verneda Skill ([id number]) -------------------------------------------------------------------------------- Pain Assessment Details Patient Name: YALEXI, ALLMOND D. Date of Service: 07/12/2015 10:45 AM Medical Record Patient Account Number: [id number] Number: Treating RN: Cornell Barman Jun 07, 1950 (65 y.o. Other Clinician: Date of Birth/Sex: Female) Treating ROBSON, MICHAEL Primary Care Physician: [facility], [facility] Physician/Extender: G Referring Physician: Keith Rake Weeks in Treatment: 3 Active Problems Location of Pain Severity and Description of Pain Patient Has Paino No Site Locations With Dressing Change: No Pain Management and Medication Current Pain Management: Electronic Signature(s) Signed: 07/12/2015 4:33:07 PM By: Gretta Cool, RN, BSN, Kim RN, BSN Entered By: Gretta Cool, RN, BSN, Kim on 07/12/2015 10:51:00 Jamal Maes ([id number]) -------------------------------------------------------------------------------- Patient/Caregiver Education Details Patient Name: Jamal Maes. Date of Service: 07/12/2015 10:45 AM Medical Record Patient Account Number: [id number] Number: Treating RN: Cornell Barman 04/03/1950 (65 y.o. Other Clinician: Date of Birth/Gender: Female) Treating ROBSON, MICHAEL Primary Care Physician: [facility], [facility] Physician/Extender: G Referring Physician: [facility] in Treatment: 3 Education Assessment Education Provided To: Patient Education  Topics Provided Wound/Skin Impairment: Handouts: Caring for Your Ulcer, Other: continue wound care as prescribed Methods: Demonstration, Explain/Verbal Responses: State content correctly Electronic Signature(s) Signed: 07/12/2015 4:33:07 PM By: Gretta Cool, RN, BSN, Kim RN, BSN Entered By: Gretta Cool, RN, BSN, Kim on 07/12/2015 11:09:07 Jamal Maes ([id number]) -------------------------------------------------------------------------------- Wound Assessment Details Patient Name: TYIESHA, FILHO D. Date of Service: 07/12/2015 10:45 AM Medical Record Patient Account Number: [id number] Number: Treating RN: Cornell Barman 02-28-1950 (65 y.o. Other Clinician: Date of Birth/Sex: Female) Treating ROBSON, [place] Primary Care Physician: [facility], [facility] Physician/Extender: G Referring Physician: Manuella Ghazi, [facility] Weeks in Treatment: 3 Wound Status Wound Number: 1 Primary Pressure Ulcer Etiology: Wound Location: Right Gluteus Wound Open Wounding Event: Pressure Injury Status: Date Acquired: 04/21/2015 Comorbid Cataracts, Hypertension, Type II Weeks Of Treatment: 3 History: Diabetes, Osteoarthritis Clustered Wound: No Photos Photo Uploaded By: Gretta Cool, RN, BSN, Kim on 07/12/2015 16:00:42 Wound Measurements Length: (cm) 0.4 % Reduction in Ar Width: (cm) 0.2 % Reduction in Vo Depth: (cm) 0.1 Epithelialization Area: (cm) 0.063 Tunneling: Volume: (cm) 0.006 Undermining: ea: 77.1% lume: 77.8% :  Small (1-33%) No No Wound Description Classification: Category/Stage II Wound Margin: Flat and Intact Exudate Amount: Medium Exudate Type: Serous Exudate Color: amber Wound Bed Granulation Amount: Small (1-33%) Exposed Structure Granulation Quality: Pink Fascia Exposed: No Necrotic Amount: Small (1-33%) Fat Layer Exposed: No Grob, Alyna D. ([id number]) Necrotic Quality: Adherent Slough Tendon Exposed: No Muscle Exposed: No Joint Exposed: No Bone Exposed: No Limited to Skin Breakdown Periwound Skin  Texture Texture Color No Abnormalities Noted: No No Abnormalities Noted: No Callus: No Atrophie Blanche: No Crepitus: No Cyanosis: No Excoriation: No Ecchymosis: No Fluctuance: No Erythema: No Friable: No Hemosiderin Staining: No Induration: No Mottled: No Localized Edema: No Pallor: No Rash: No Rubor: No Scarring: No Moisture No Abnormalities Noted: No Dry / Scaly: No Maceration: No Moist: Yes Wound Preparation Ulcer Cleansing: Rinsed/Irrigated with Saline Topical Anesthetic Applied: None Treatment Notes Wound #1 (Right Gluteus) 1. Cleansed with: Clean wound with Normal Saline 4. Dressing Applied: Aquacel Ag 5. Secondary Dressing Applied Bordered Foam Dressing Electronic Signature(s) Signed: 07/12/2015 4:33:07 PM By: Gretta Cool, RN, BSN, Kim RN, BSN Entered By: Gretta Cool, RN, BSN, Kim on 07/12/2015 10:53:07 Jamal Maes ([id number]) -------------------------------------------------------------------------------- Vitals Details Patient Name: [name] Date of Service: 07/12/2015 10:45 AM Medical Record Patient Account Number: [id number] Number: Treating RN: Cornell Barman August 10, 1950 (65 y.o. Other Clinician: Date of Birth/Sex: Female) Treating ROBSON, MICHAEL Primary Care Physician: [facility] [place]: G Referring Physician: [facility], [facility] Weeks in Treatment: 3 Vital Signs Time Taken: 10:51 Temperature (F): 97.7 Height (in): 62 Pulse (bpm): 92 Weight (lbs): 175 Respiratory Rate (breaths/min): 18 Body Mass Index (BMI): 32 Blood Pressure (mmHg): 133/62 Reference Range: 80 - 120 mg / dl Electronic Signature(s) Signed: 07/12/2015 4:33:07 PM By: Gretta Cool, RN, BSN, Kim RN, BSN Entered By: Gretta Cool, RN, BSN, Kim on 07/12/2015 10:51:20

## 2015-07-14 ENCOUNTER — Telehealth: Payer: Self-pay | Admitting: Radiology

## 2015-07-14 NOTE — Telephone Encounter (Signed)
Notified pt of surgery scheduled 07/25/15, pre-admit testing appt on 07/19/15 @ 1:00 & to call Friday prior to surgery for arrival time to SDS. Pt voices understanding.

## 2015-07-19 ENCOUNTER — Encounter
Admission: RE | Admit: 2015-07-19 | Discharge: 2015-07-19 | Disposition: A | Discharge status: Home / Self Care | Payer: PPO | Source: Ambulatory Visit | Attending: Urology | Admitting: Urology

## 2015-07-19 ENCOUNTER — Encounter: Payer: PPO | Admitting: Internal Medicine

## 2015-07-19 ENCOUNTER — Ambulatory Visit: Payer: Self-pay | Admitting: Family Medicine

## 2015-07-19 DIAGNOSIS — Z01818 Encounter for other preprocedural examination: Secondary | ICD-10-CM | POA: Diagnosis not present

## 2015-07-19 DIAGNOSIS — I10 Essential (primary) hypertension: Secondary | ICD-10-CM | POA: Diagnosis not present

## 2015-07-19 DIAGNOSIS — L89312 Pressure ulcer of right buttock, stage 2: Secondary | ICD-10-CM | POA: Diagnosis not present

## 2015-07-19 HISTORY — DX: Hypothyroidism, unspecified: E03.9

## 2015-07-19 HISTORY — DX: Nausea with vomiting, unspecified: Z98.890

## 2015-07-19 HISTORY — DX: Nausea with vomiting, unspecified: R11.2

## 2015-07-19 LAB — SURGICAL PCR SCREEN
MRSA, PCR: NEGATIVE
Staphylococcus aureus: NEGATIVE

## 2015-07-19 NOTE — Patient Instructions (Signed)
Your procedure is scheduled on: Monday 07/25/15 Report to Day Surgery. 2ND FLOOR MEDICAL MALL ENTRANCE To find out your arrival time please call [phone] between 1PM - 3PM on Friday 07/22/15.  Remember: Instructions that are not followed completely may result in serious medical risk, up to and including death, or upon the discretion of your surgeon and anesthesiologist your surgery may need to be rescheduled.    __X__ 1. Do not eat food or drink liquids after midnight. No gum chewing or hard candies.     __X__ 2. No Alcohol for 24 hours before or after surgery.   ____ 3. Bring all medications with you on the day of surgery if instructed.    __X__ 4. Notify your doctor if there is any change in your medical condition     (cold, fever, infections).     Do not wear jewelry, make-up, hairpins, clips or nail polish.  Do not wear lotions, powders, or perfumes.   Do not shave 48 hours prior to surgery. Men may shave face and neck.  Do not bring valuables to the hospital.    [facility] is not responsible for any belongings or valuables.               Contacts, dentures or bridgework may not be worn into surgery.  Leave your suitcase in the car. After surgery it may be brought to your room.  For patients admitted to the hospital, discharge time is determined by your                treatment team.   Patients discharged the day of surgery will not be allowed to drive home.   Please read over the following fact sheets that you were given:   MRSA Information and Surgical Site Infection Prevention   __X__ Take these medicines the morning of surgery with A SIP OF WATER:    1. ALPRAZOLAM IF NEEDED  2. LISINOPRIL  3. CITALOPRAM  4. LEVOTHYROXINE  5.  6.  ____ Fleet Enema (as directed)   __X__ Use CHG Soap as directed FOR 5 DAYS PRIOR TO SURGERY AND MORNING OF SURGERY  ____ Use inhalers on the day of surgery  ____ Stop metformin 2 days prior to surgery    ____ Take 1/2 of usual  insulin dose the night before surgery and none on the morning of surgery.   ____ Stop Coumadin/Plavix/aspirin on   ____ Stop Anti-inflammatories on    ____ Stop supplements until after surgery.    ____ Bring C-Pap to the hospital.

## 2015-07-19 NOTE — Pre-Procedure Instructions (Signed)
MEDICAL CLEARANCE REQUEST/ EKG AS INSTRUCTED BY DR Kayleen Memos CALLED AND FAXED TO DR Dossie Der [facility] AND SPOKE WITH CASSANDRA. ALSO CALLED AND FAXED TO [email or web address] AMY

## 2015-07-19 NOTE — Progress Notes (Signed)
[name], [name] ([id number]) Visit Report for 07/19/2015 Chief Complaint Document Details Patient Name: [name], [name]. Date of Service: 07/19/2015 10:45 AM Medical Record Patient Account Number: [id number] Number: Treating RN: Cornell Barman 1950-11-06 (65 y.o. Other Clinician: Date of Birth/Sex: Female) Treating Tausha Milhoan Primary Care Physician/Extender: Wyline Beady, SYED Physician: Referring Physician: Keith Rake Weeks in Treatment: 4 Information Obtained from: Patient Chief Complaint Patient arrives for review of an advertised pressure ulcer on her right buttock Electronic Signature(s) Signed: 07/19/2015 4:35:27 PM By: Linton Ham MD Entered By: Linton Ham on 07/19/2015 12:18:09 [name] ([id number]) -------------------------------------------------------------------------------- Debridement Details Patient Name: Jill Gip D. Date of Service: 07/19/2015 10:45 AM Medical Record Patient Account Number: [id number] Number: Treating RN: Cornell Barman 1950/08/27 (65 y.o. Other Clinician: Date of Birth/Sex: Female) Treating Gerell Fortson Primary Care Physician/Extender: Wyline Beady, SYED Physician: Referring Physician: Keith Rake Weeks in Treatment: 4 Debridement Performed for Wound #1 Right Gluteus Assessment: Performed By: Physician Ricard Dillon, MD Debridement: Open Wound/Selective Debridement Selective Description: Pre-procedure Yes Verification/Time Out Taken: Start Time: 11:05 Total Area Debrided (L x 0.4 (cm) x 0.2 (cm) = 0.08 (cm) W): Tissue and other Non-Viable, Exudate, Skin material debrided: Instrument: Blade Bleeding: Minimum Hemostasis Achieved: Pressure End Time: 11:07 Procedural Pain: 3 Post Procedural Pain: 0 Response to Treatment: Procedure was tolerated well Post Debridement Measurements of Total Wound Length: (cm) 0.4 Stage: Category/Stage II Width: (cm) 0.2 Depth: (cm) 0.2 Volume: (cm) 0.013 Post Procedure  Diagnosis Same as Pre-procedure Electronic Signature(s) Signed: 07/19/2015 4:35:27 PM By: Linton Ham MD Signed: 07/19/2015 4:41:13 PM By: Gretta Cool RN, BSN, Kim RN, BSN Previous Signature: 07/19/2015 11:49:42 AM Version By: Gretta Cool RN, BSN, Kim RN, BSN Entered By: Linton Ham on 07/19/2015 12:17:50 [name], [name] ([id number]) [place], [place]. ([id number]) -------------------------------------------------------------------------------- HPI Details Patient Name: [name], [name]. Date of Service: 07/19/2015 10:45 AM Medical Record Patient Account Number: [id number] Number: Treating RN: Cornell Barman 04-05-1950 (65 y.o. Other Clinician: Date of Birth/Sex: Female) Treating Hamlin Devine Primary Care Physician/Extender: Wyline Beady, SYED Physician: Referring Physician: Keith Rake Weeks in Treatment: 4 History of Present Illness HPI Description: 06/21/15; this is a 65 year old woman who arrives with a 2 to three-month history of what his average size this is stage II pressure ulcer on her right buttock. She is referred from her her OB/GYN who is been following her closely for Lichen sclerosis. She is been applying steroid cream to the wound on her buttock which is what is been prescribed for her chronic vulvar issues. Looking back through [facility] I can see reference of this to March 9 again at her gynecologist where she was listed as having an "boil of her buttock". She does not have a prior history of skin abscesses. She does have a history of psoriasis although this is been well controlled over the last year with Cosentyx. In terms of the risk factors for pressure ulcers I really can't pick these up for. She is ambulatory. Does not give an easy history of being immobilized for a prolonged period of time/hard surfaces etc. she lives alone and seems to be reasonably independent. She had a recently elevated hemoglobin A1c of 6 but is not a known diabetic 06/28/15; culture I did of this  area on her right buttock grew MRSA. This is raise my suspicion that this was initially a furuncle. It already however looks somewhat better with the Aquacel Ag 07/05/15; I think the area is better 07/12/15; the area is just about  closed. She is still complaining of pain in the area 07/19/15 small area that is not closed. Very tiny surface of adherent slough which underwent debridement. Electronic Signature(s) Signed: 07/19/2015 4:35:27 PM By: Linton Ham MD Entered By: Linton Ham on 07/19/2015 12:18:39 Salazar, Verneda Skill ([id number]) -------------------------------------------------------------------------------- Physical Exam Details Patient Name: [name], [name]. Date of Service: 07/19/2015 10:45 AM Medical Record Patient Account Number: [id number] Number: Treating RN: Cornell Barman 16-May-1950 (65 y.o. Other Clinician: Date of Birth/Sex: Female) Treating Libbie Bartley Primary Care Physician/Extender: Wyline Beady, SYED Physician: Referring Physician: Manuella Ghazi, SYED Weeks in Treatment: 4 Notes Wound exam; very tiny linear remaining open area. Clearly some surface slough that I am attempted to agitate somewhat. Electronic Signature(s) Signed: 07/19/2015 4:35:27 PM By: Linton Ham MD Entered By: Linton Ham on 07/19/2015 12:19:36 Simson, Verneda Skill ([id number]) -------------------------------------------------------------------------------- Physician Orders Details Patient Name: DANARIA, FREI. Date of Service: 07/19/2015 10:45 AM Medical Record Patient Account Number: [id number] Number: Treating RN: Cornell Barman 1950-10-21 (65 y.o. Other Clinician: Date of Birth/Sex: Female) Treating Markel Mergenthaler Primary Care Physician/Extender: Wyline Beady, SYED Physician: Referring Physician: Keith Rake Weeks in Treatment: 4 Verbal / Phone Orders: Yes Clinician: Cornell Barman Read Back and Verified: Yes Diagnosis Coding Wound Cleansing Wound #1 Right Gluteus o Clean wound with Normal  Saline. Skin Barriers/Peri-Wound Care Wound #1 Right Gluteus o Barrier cream Primary Wound Dressing Wound #1 Right Gluteus o Prisma Ag Secondary Dressing Wound #1 Right Gluteus o Boardered Foam Dressing Dressing Change Frequency Wound #1 Right Gluteus o Three times weekly Follow-up Appointments Wound #1 Right Gluteus o Return Appointment in 1 week. Additional Orders / Instructions Wound #1 Right Gluteus o Activity as tolerated o Other: - switch positions while sitting Electronic Signature(s) Signed: 07/19/2015 11:49:42 AM By: Gretta Cool, RN, BSN, Kim RN, BSN Brockel, Collin D. ([id number]) Signed: 07/19/2015 4:35:27 PM By: Linton Ham MD Entered By: Gretta Cool, RN, BSN, Kim on 07/19/2015 11:19:57 [name], [name] ([id number]) -------------------------------------------------------------------------------- Problem List Details Patient Name: TEMIA, JENKINS. Date of Service: 07/19/2015 10:45 AM Medical Record Patient Account Number: [id number] Number: Treating RN: Cornell Barman Feb 16, 1950 (65 y.o. Other Clinician: Date of Birth/Sex: Female) Treating Jamillah Camilo Primary Care Physician/Extender: Wyline Beady, SYED Physician: Referring Physician: Keith Rake Weeks in Treatment: 4 Active Problems ICD-10 Encounter Code Description Active Date Diagnosis L89.312 Pressure ulcer of right buttock, stage 2 06/21/2015 Yes Inactive Problems Resolved Problems Electronic Signature(s) Signed: 07/19/2015 4:35:27 PM By: Linton Ham MD Entered By: Linton Ham on 07/19/2015 12:17:29 Opara, Verneda Skill ([id number]) -------------------------------------------------------------------------------- Progress Note Details Patient Name: [name], [name] Date of Service: 07/19/2015 10:45 AM Medical Record Patient Account Number: [id number] Number: Treating RN: Cornell Barman 1950/09/06 (65 y.o. Other Clinician: Date of Birth/Sex: Female) Treating Bernon Arviso Primary Care  Physician/Extender: Wyline Beady, SYED Physician: Referring Physician: Keith Rake Weeks in Treatment: 4 Subjective Chief Complaint Information obtained from Patient Patient arrives for review of an advertised pressure ulcer on her right buttock History of Present Illness (HPI) 06/21/15; this is a 65 year old woman who arrives with a 2 to three-month history of what his average size this is stage II pressure ulcer on her right buttock. She is referred from her her OB/GYN who is been following her closely for Lichen sclerosis. She is been applying steroid cream to the wound on her buttock which is what is been prescribed for her chronic vulvar issues. Looking [facility] through [facility] I can see reference of this to March 9 again at her gynecologist where she  was listed as having an "boil of her buttock". She does not have a prior history of skin abscesses. She does have a history of psoriasis although this is been well controlled over the last year with Cosentyx. In terms of the risk factors for pressure ulcers I really can't pick these up for. She is ambulatory. Does not give an easy history of being immobilized for a prolonged period of time/hard surfaces etc. she lives alone and seems to be reasonably independent. She had a recently elevated hemoglobin A1c of 6 but is not a known diabetic 06/28/15; culture I did of this area on her right buttock grew MRSA. This is raise my suspicion that this was initially a furuncle. It already however looks somewhat better with the Aquacel Ag 07/05/15; I think the area is better 07/12/15; the area is just about closed. She is still complaining of pain in the area 07/19/15 small area that is not closed. Very tiny surface of adherent slough which underwent debridement. Objective Constitutional Vitals Time Taken: 10:57 AM, Height: 62 in, Weight: 175 lbs, BMI: 32, Temperature: 98.1 F, Pulse: 98 bpm, Respiratory Rate: 18 breaths/min, Blood Pressure: 122/63  mmHg. Integumentary (Hair, Skin) [name], [name] ([id number]) Wound #1 status is Open. Original cause of wound was Pressure Injury. The wound is located on the Right Gluteus. The wound measures 0.4cm length x 0.2cm width x 0.1cm depth; 0.063cm^2 area and 0.006cm^3 volume. The wound is limited to skin breakdown. There is no tunneling or undermining noted. There is a small amount of serous drainage noted. The wound margin is flat and intact. There is small (1-33%) pink granulation within the wound bed. There is a small (1-33%) amount of necrotic tissue within the wound bed including Adherent Slough. The periwound skin appearance exhibited: Moist. The periwound skin appearance did not exhibit: Callus, Crepitus, Excoriation, Fluctuance, Friable, Induration, Localized Edema, Rash, Scarring, Dry/Scaly, Maceration, Atrophie Blanche, Cyanosis, Ecchymosis, Hemosiderin Staining, Mottled, Pallor, Rubor, Erythema. Assessment Active Problems ICD-10 L89.312 - Pressure ulcer of right buttock, stage 2 Procedures Wound #1 Wound #1 is a Pressure Ulcer located on the Right Gluteus . There was an Open Wound debridement with total area of 0.08 sq cm performed by Ricard Dillon, MD. with the following instrument(s): Blade to remove Non-Viable tissue/material including Exudate and Skin. A time out was conducted prior to the start of the procedure. A Minimum amount of bleeding was controlled with Pressure. The procedure was tolerated well with a pain level of 3 throughout and a pain level of 0 following the procedure. Post Debridement Measurements: 0.4cm length x 0.2cm width x 0.2cm depth; 0.013cm^3 volume. Post debridement Stage noted as Category/Stage II. Post procedure Diagnosis Wound #1: Same as Pre-Procedure Plan Wound Cleansing: Wound #1 Right Gluteus: Clean wound with Normal Saline. Skin Barriers/Peri-Wound Care: Wound #1 Right Gluteus: [name], [name]. ([id number]) Barrier cream Primary Wound  Dressing: Wound #1 Right Gluteus: Prisma Ag Secondary Dressing: Wound #1 Right Gluteus: Boardered Foam Dressing Dressing Change Frequency: Wound #1 Right Gluteus: Three times weekly Follow-up Appointments: Wound #1 Right Gluteus: Return Appointment in 1 week. Additional Orders / Instructions: Wound #1 Right Gluteus: Activity as tolerated Other: - switch positions while sitting #1 I change the dressing to silver collagen from silver alginate covered with border foam change every 2 days Electronic Signature(s) Signed: 07/19/2015 4:35:27 PM By: Linton Ham MD Entered By: Linton Ham on 07/19/2015 12:20:07 [name], Verneda Skill ([id number]) -------------------------------------------------------------------------------- SuperBill Details Patient Name: Jill Gip D. Date of Service: 07/19/2015 Medical  Record Patient Account Number: [id number] Number: Treating RN: Cornell Barman [birth year] (65 y.o. Other Clinician: Date of Birth/Sex: Female) Treating Malayna Noori Primary Care Physician/Extender: Wyline Beady, SYED Physician: Suella Grove in Treatment: 4 Referring Physician: [facility], SYED Diagnosis Coding ICD-10 Codes Code Description [phone] Pressure ulcer of right buttock, stage 2 Facility Procedures CPT4 Code: [id number] Description: [id number] - DEBRIDE WOUND 1ST 20 SQ CM OR < ICD-10 Description Diagnosis L89.312 Pressure ulcer of right buttock, stage 2 Modifier: Quantity: 1 Physician Procedures CPT4 Code: [id number] Description: [id number] - WC PHYS DEBR WO ANESTH 20 SQ CM ICD-10 Description Diagnosis L89.312 Pressure ulcer of right buttock, stage 2 Modifier: Quantity: 1 Electronic Signature(s) Signed: 07/19/2015 4:35:27 PM By: Linton Ham MD Entered By: Linton Ham on 07/19/2015 12:20:46

## 2015-07-19 NOTE — Progress Notes (Addendum)
[name], [name] ([id number]) Visit Report for 07/19/2015 Arrival Information Details Patient Name: [name], [name]. Date of Service: 07/19/2015 10:45 AM Medical Record Patient Account Number: [id number] Number: Treating RN: Cornell Barman 11-May-1950 (65 y.o. Other Clinician: Date of Birth/Sex: Female) Treating ROBSON, MICHAEL Primary Care Physician: [facility], [place] Physician/Extender: G Referring Physician: Keith Rake Weeks in Treatment: 4 Visit Information History Since Last Visit Added or deleted any medications: No Patient Arrived: Ambulatory Any new allergies or adverse reactions: No Arrival Time: 10:56 Had a fall or experienced change in No Accompanied By: self activities of [name] living that may affect Transfer Assistance: None risk of falls: Patient Identification Verified: Yes Signs or symptoms of abuse/neglect since last No Secondary Verification Process Yes visito Completed: Hospitalized since last visit: No Has Dressing in Place as Prescribed: Yes Pain Present Now: No Electronic Signature(s) Signed: 07/19/2015 11:49:42 AM By: Gretta Cool, RN, BSN, Kim RN, BSN Entered By: Gretta Cool, RN, BSN, Kim on 07/19/2015 10:56:57 Pett, [name] ([id number]) -------------------------------------------------------------------------------- Encounter Discharge Information Details Patient Name: Jill Gip D. Date of Service: 07/19/2015 10:45 AM Medical Record Patient Account Number: [id number] Number: Treating RN: Cornell Barman Jul 03, 1950 (65 y.o. Other Clinician: Date of Birth/Sex: Female) Treating ROBSON, MICHAEL Primary Care Physician: [facility], [place] Physician/Extender: G Referring Physician: Keith Rake Weeks in Treatment: 4 Encounter Discharge Information Items Discharge Pain Level: 0 Discharge Condition: Stable Ambulatory Status: Ambulatory Discharge Destination: Home Transportation: Private Auto Accompanied By: self Schedule Follow-up Appointment: Yes Medication Reconciliation  completed and provided to Patient/Care Yes [name]: Provided on Clinical Summary of Care: 07/19/2015 Form Type Recipient Paper [facility] Electronic Signature(s) Signed: 07/19/2015 11:49:42 AM By: Gretta Cool, RN, BSN, Kim RN, BSN Previous Signature: 07/19/2015 11:20:23 AM Version By: Ruthine Dose Entered By: Gretta Cool RN, BSN, Kim on 07/19/2015 11:24:07 [name] ([id number]) -------------------------------------------------------------------------------- General Visit Notes Details Patient Name: Jill Gip D. Date of Service: 07/19/2015 10:45 AM Medical Record Patient Account Number: [id number] Number: Treating RN: Cornell Barman 20-Dec-1950 (65 y.o. Other Clinician: Date of Birth/Sex: Female) Treating ROBSON, MICHAEL Primary Care Physician: [facility], [place] Physician/Extender: G Referring Physician: Keith Rake Weeks in Treatment: 4 Notes Patient states she is having surgery to remove a bladder stimulator that is no longer working. The stimulator is in her lower back. Electronic Signature(s) Signed: 07/19/2015 11:49:42 AM By: Gretta Cool, RN, BSN, Kim RN, BSN Entered By: Gretta Cool, RN, BSN, Kim on 07/19/2015 11:04:04 HERMAN, HEMRIC ([id number]) -------------------------------------------------------------------------------- Multi Wound Chart Details Patient Name: [name], [name] Date of Service: 07/19/2015 10:45 AM Medical Record Patient Account Number: [id number] Number: Treating RN: Cornell Barman Feb 28, 1950 (65 y.o. Other Clinician: Date of Birth/Sex: Female) Treating ROBSON, MICHAEL Primary Care Physician: [facility], [place] Physician/Extender: G Referring Physician: [facility], [place] Weeks in Treatment: 4 Vital Signs Height(in): 62 Pulse(bpm): 98 Weight(lbs): 175 Blood Pressure 122/63 (mmHg): Body Mass Index(BMI): 32 Temperature(F): 98.1 Respiratory Rate 18 (breaths/min): Photos: [N/A:N/A] Wound Location: Right Gluteus N/A N/A Wounding Event: Pressure Injury N/A N/A Primary  Etiology: Pressure Ulcer N/A N/A Comorbid History: Cataracts, Hypertension, N/A N/A Type II Diabetes, Osteoarthritis Date Acquired: 04/21/2015 N/A N/A Weeks of Treatment: 4 N/A N/A Wound Status: Open N/A N/A Measurements L x W x D 0.4x0.2x0.1 N/A N/A (cm) Area (cm) : 0.063 N/A N/A Volume (cm) : 0.006 N/A N/A % Reduction in Area: 77.10% N/A N/A % Reduction in Volume: 77.80% N/A N/A Classification: Category/Stage II N/A N/A Exudate Amount: Small N/A N/A Exudate Type: Serous N/A N/A Exudate Color: amber N/A N/A Wound Margin: Flat and Intact  N/A N/A Granulation Amount: Small (1-33%) N/A N/A Brocks, [name] ([id number]) Granulation Quality: Pink N/A N/A Necrotic Amount: Small (1-33%) N/A N/A Exposed Structures: Fascia: No N/A N/A Fat: No Tendon: No Muscle: No Joint: No Bone: No Limited to Skin Breakdown Epithelialization: Small (1-33%) N/A N/A Periwound Skin Texture: Edema: No N/A N/A Excoriation: No Induration: No Callus: No Crepitus: No Fluctuance: No Friable: No Rash: No Scarring: No Periwound Skin Moist: Yes N/A N/A Moisture: Maceration: No Dry/Scaly: No Periwound Skin Color: Atrophie Blanche: No N/A N/A Cyanosis: No Ecchymosis: No Erythema: No Hemosiderin Staining: No Mottled: No Pallor: No Rubor: No Tenderness on No N/A N/A Palpation: Wound Preparation: Ulcer Cleansing: N/A N/A Rinsed/Irrigated with Saline Topical Anesthetic Applied: None Treatment Notes Electronic Signature(s) Signed: 07/19/2015 11:49:42 AM By: Gretta Cool, RN, BSN, Kim RN, BSN Entered By: Gretta Cool, RN, BSN, Kim on 07/19/2015 11:01:10 [name], [name] ([id number]) -------------------------------------------------------------------------------- Multi-Disciplinary Care Plan Details Patient Name: [name], [name]. Date of Service: 07/19/2015 10:45 AM Medical Record Patient Account Number: [id number] Number: Treating RN: Cornell Barman 1950-03-01 (65 y.o. Other Clinician: Date of  Birth/Sex: Female) Treating ROBSON, [place] Primary Care Physician: [facility], [place] Physician/Extender: G Referring Physician: Keith Rake Weeks in Treatment: 4 Active Inactive Nutrition Nursing Diagnoses: Imbalanced nutrition Goals: Patient/caregiver will maintain therapeutic glucose control Date Initiated: 06/21/2015 Goal Status: Active Interventions: Provide education on elevated blood sugars and impact on wound healing Notes: Orientation to the Wound Care Program Nursing Diagnoses: Knowledge deficit related to the wound healing center program Goals: Patient/caregiver will verbalize understanding of the [place] Program Date Initiated: 06/21/2015 Goal Status: Active Interventions: Provide education on orientation to the wound center Notes: Pressure Nursing Diagnoses: Knowledge deficit related to management of pressures ulcers Goals: [name], [name] ([id number]) Patient will remain free of pressure ulcers Date Initiated: 06/21/2015 Goal Status: Active Interventions: Provide education on pressure ulcers Notes: Wound/Skin Impairment Nursing Diagnoses: Impaired tissue integrity Goals: Ulcer/skin breakdown will heal within 14 weeks Date Initiated: 06/21/2015 Goal Status: Active Interventions: Assess patient/caregiver ability to obtain necessary supplies Notes: Electronic Signature(s) Signed: 07/19/2015 11:49:42 AM By: Gretta Cool, RN, BSN, Kim RN, BSN Entered By: Gretta Cool, RN, BSN, Kim on 07/19/2015 11:01:02 Impson, [name] ([id number]) -------------------------------------------------------------------------------- Pain Assessment Details Patient Name: Jill Gip D. Date of Service: 07/19/2015 10:45 AM Medical Record Patient Account Number: [id number] Number: Treating RN: Cornell Barman May 13, 1950 (65 y.o. Other Clinician: Date of Birth/Sex: Female) Treating ROBSON, MICHAEL Primary Care Physician: [facility], [place] Physician/Extender: G Referring Physician: Keith Rake Weeks in Treatment: 4 Active Problems Location of Pain Severity and Description of Pain Patient Has Paino No Site Locations With Dressing Change: No Pain Management and Medication Current Pain Management: Electronic Signature(s) Signed: 07/19/2015 11:49:42 AM By: Gretta Cool, RN, BSN, Kim RN, BSN Entered By: Gretta Cool, RN, BSN, Kim on 07/19/2015 10:57:24 [name] ([id number]) -------------------------------------------------------------------------------- Patient/Caregiver Education Details Patient Name: [name]. Date of Service: 07/19/2015 10:45 AM Medical Record Patient Account Number: [id number] Number: Treating RN: Cornell Barman [birth year] (65 y.o. Other Clinician: Date of Birth/Gender: Female) Treating ROBSON, MICHAEL Primary Care Physician: [facility], [place] Physician/Extender: G Referring Physician: [facility] in Treatment: 4 Education Assessment Education Provided To: Patient Education Topics Provided Wound/Skin Impairment: Handouts: Caring for Your Ulcer, Other: continue wound care as prescribed Methods: Demonstration Responses: State content correctly Electronic Signature(s) Signed: 07/19/2015 11:49:42 AM By: Gretta Cool, RN, BSN, Kim RN, BSN Entered By: Gretta Cool, RN, BSN, Kim on 07/19/2015 11:24:31 [name], [name] ([id number]) -------------------------------------------------------------------------------- Wound Assessment Details Patient Name: [name],  [name] Date of Service: 07/19/2015 10:45 AM Medical Record Patient Account Number: [id number] Number: Treating RN: Cornell Barman 02-03-1951 (65 y.o. Other Clinician: Date of Birth/Sex: Female) Treating ROBSON, MICHAEL Primary Care Physician: [facility], [place] Physician/Extender: G Referring Physician: Manuella Ghazi, [facility] Weeks in Treatment: 4 Wound Status Wound Number: 1 Primary Pressure Ulcer Etiology: Wound Location: Right Gluteus Wound Open Wounding Event: Pressure Injury Status: Date Acquired:  04/21/2015 Comorbid Cataracts, Hypertension, Type II Weeks Of Treatment: 4 History: Diabetes, Osteoarthritis Clustered Wound: No Photos Wound Measurements Length: (cm) 0.4 % Reduction in Area Width: (cm) 0.2 % Reduction in Volu Depth: (cm) 0.1 Epithelialization: Area: (cm) 0.063 Tunneling: Volume: (cm) 0.006 Undermining: : 77.1% me: 77.8% Small (1-33%) No No Wound Description Classification: Category/Stage II Wound Margin: Flat and Intact Exudate Amount: Small Exudate Type: Serous Exudate Color: amber Wound Bed Granulation Amount: Small (1-33%) Exposed Structure Granulation Quality: Pink Fascia Exposed: No Necrotic Amount: Small (1-33%) Fat Layer Exposed: No Labreck, Natausha D. ([id number]) Necrotic Quality: Adherent Slough Tendon Exposed: No Muscle Exposed: No Joint Exposed: No Bone Exposed: No Limited to Skin Breakdown Periwound Skin Texture Texture Color No Abnormalities Noted: No No Abnormalities Noted: No Callus: No Atrophie Blanche: No Crepitus: No Cyanosis: No Excoriation: No Ecchymosis: No Fluctuance: No Erythema: No Friable: No Hemosiderin Staining: No Induration: No Mottled: No Localized Edema: No Pallor: No Rash: No Rubor: No Scarring: No Moisture No Abnormalities Noted: No Dry / Scaly: No Maceration: No Moist: Yes Wound Preparation Ulcer Cleansing: Rinsed/Irrigated with Saline Topical Anesthetic Applied: None Treatment Notes Wound #1 (Right Gluteus) 1. Cleansed with: Clean wound with Normal Saline 4. Dressing Applied: Prisma Ag 5. Secondary Dressing Applied Bordered Foam Dressing Electronic Signature(s) Signed: 07/19/2015 11:49:42 AM By: Gretta Cool, RN, BSN, Kim RN, BSN Entered By: Gretta Cool, RN, BSN, Kim on 07/19/2015 11:00:20 TIKEYA, VAUX ([id number]) -------------------------------------------------------------------------------- Vitals Details Patient Name: Jill Gip D. Date of Service: 07/19/2015 10:45 AM Medical Record Patient  Account Number: [id number] Number: Treating RN: Cornell Barman 01-13-1951 (65 y.o. Other Clinician: Date of Birth/Sex: Female) Treating ROBSON, MICHAEL Primary Care Physician: [facility], Avis: G Referring Physician: [facility], [facility] Weeks in Treatment: 4 Vital Signs Time Taken: 10:57 Temperature (F): 98.1 Height (in): 62 Pulse (bpm): 98 Weight (lbs): 175 Respiratory Rate (breaths/min): 18 Body Mass Index (BMI): 32 Blood Pressure (mmHg): 122/63 Reference Range: 80 - 120 mg / dl Electronic Signature(s) Signed: 07/19/2015 11:49:42 AM By: Gretta Cool, RN, BSN, Kim RN, BSN Entered By: Gretta Cool, RN, BSN, Kim on 07/19/2015 10:57:42

## 2015-07-20 ENCOUNTER — Ambulatory Visit (INDEPENDENT_AMBULATORY_CARE_PROVIDER_SITE_OTHER): Payer: PPO | Admitting: Family Medicine

## 2015-07-20 ENCOUNTER — Encounter: Payer: Self-pay | Admitting: Family Medicine

## 2015-07-20 VITALS — BP 142/76 | HR 110 | Temp 98.4°F | Resp 16 | Ht 63.0 in | Wt 177.6 lb

## 2015-07-20 DIAGNOSIS — F410 Panic disorder [episodic paroxysmal anxiety] without agoraphobia: Secondary | ICD-10-CM

## 2015-07-20 MED ORDER — ALPRAZOLAM 0.5 MG PO TABS: 0.5000 mg | ORAL_TABLET | Freq: Three times a day (TID) | ORAL | Status: DC | PRN

## 2015-07-20 MED ORDER — ALPRAZOLAM 1 MG PO TABS: 1.0000 mg | ORAL_TABLET | Freq: Every evening | ORAL | Status: DC | PRN

## 2015-07-20 NOTE — Progress Notes (Signed)
Name: [name]   MRN: [id number]    DOB: 1950-02-07   Date:07/20/2015       Progress Note  Subjective  Chief Complaint  Chief Complaint  Patient presents with  . Anxiety    medication refills  . Surgical Clearance    Anxiety Presents for follow-up visit. The problem has been unchanged. Symptoms include nervous/anxious behavior and panic.   Her past medical history is significant for anxiety/panic attacks. Past treatments include benzodiazephines.     Past Medical History  Diagnosis Date  . GERD (gastroesophageal reflux disease)   . Hypertension   . Chronic back pain   . Anxiety   . Depression   . Thyroid disease   . Psoriasis   . Pneumonia     RECENT X 3  . Arthritis   . Hypothyroidism   . PONV (postoperative nausea and vomiting)     Past Surgical History  Procedure Laterality Date  . Bladder stimulator    . Hand surgery Right   . Oophorectomy Right     ovarian cyst  . Abdominal hysterectomy      heavy bleeding  . Cataract extraction w/phaco Right 03/07/2015    Procedure: CATARACT EXTRACTION PHACO AND INTRAOCULAR LENS PLACEMENT (IOC);  Surgeon: Estill Cotta, MD;  Location: [facility] ORS;  Service: Ophthalmology;  Laterality: Right;  [name] 01:14 AP% 26.4 CDE 33.04 fluid pack lot # [id number]  . Cataract extraction w/phaco Left 03/21/2015    Procedure: CATARACT EXTRACTION PHACO AND INTRAOCULAR LENS PLACEMENT (IOC);  Surgeon: Estill Cotta, MD;  Location: [facility] ORS;  Service: Ophthalmology;  Laterality: Left;  [name] 01:21 AP% 25.6 CDE 39.78 fluid pack lot # [id number]  . Carpal tunnel release      Family History  Problem Relation Age of Onset  . Heart disease Mother   . COPD Father     Social History   Social History  . Marital Status: Widowed    Spouse Name: N/A  . Number of Children: N/A  . Years of Education: N/A   Occupational History  . Not on file.   Social History Main Topics  . Smoking status: Current Every Day Smoker -- 1.00 packs/day     Types: Cigarettes  . Smokeless tobacco: Never Used  . Alcohol Use: No  . Drug Use: No  . Sexual Activity: No   Other Topics Concern  . Not on file   Social History Narrative     Current outpatient prescriptions:  .  acetaminophen (TYLENOL) 500 MG tablet, Take 500 mg by mouth every 6 (six) hours as needed. Reported on 07/01/2015, Disp: , Rfl:  .  ALPRAZolam (XANAX) 0.5 MG tablet, Take 1 tablet (0.5 mg total) by mouth 3 (three) times daily as needed for anxiety., Disp: 90 tablet, Rfl: 0 .  ALPRAZolam (XANAX) 1 MG tablet, Take 1 tablet (1 mg total) by mouth at bedtime as needed for anxiety., Disp: 30 tablet, Rfl: 0 .  amLODipine (NORVASC) 5 MG tablet, Take 1 tablet (5 mg total) by mouth daily., Disp: 90 tablet, Rfl: 0 .  citalopram (CELEXA) 20 MG tablet, Take 1 tablet (20 mg total) by mouth daily., Disp: 90 tablet, Rfl: 0 .  clobetasol ointment (TEMOVATE) [id number] %, Apply 1 application topically daily. Use twice a day for 2 weeks, then daily for 4 weeks., Disp: 30 g, Rfl: 3 .  doxycycline (VIBRAMYCIN) 100 MG capsule, Reported on 07/19/2015, Disp: , Rfl:  .  levothyroxine (SYNTHROID, LEVOTHROID) 100 MCG tablet, Take 1 tablet (100  mcg total) by mouth daily before breakfast., Disp: 90 tablet, Rfl: 0 .  lisinopril (PRINIVIL,ZESTRIL) 10 MG tablet, Take 0.5 tablets (5 mg total) by mouth every morning. Take 5 mg every morning. (Patient taking differently: Take 10 mg by mouth every morning. Take 5 mg every morning.), Disp: 90 tablet, Rfl: 0 .  Secukinumab (COSENTYX [place]), Inject into the skin every 30 (thirty) days., Disp: , Rfl:  .  Vitamin D, Ergocalciferol, (DRISDOL) 50000 units CAPS capsule, Take 1 capsule (50,000 Units total) by mouth once a week. For 12 weeks, Disp: 12 capsule, Rfl: 0  Allergies  Allergen Reactions  . Ciprofloxacin Hives and Diarrhea  . Oxycodone Diarrhea  . Oxycodone-Acetaminophen Diarrhea  . Sulfa Antibiotics Rash    Throat, mouth     Review of Systems   Psychiatric/Behavioral: Positive for depression. The patient is nervous/anxious.     Objective  Filed Vitals:   07/20/15 1508  BP: 142/76  Pulse: 110  Temp: 98.4 F (36.9 C)  TempSrc: Oral  Resp: 16  Height: 5\' 3"  (1.6 m)  Weight: 177 lb 9.6 oz (80.559 kg)  SpO2: 92%    Physical Exam  Constitutional: She is oriented to person, place, and time and well-developed, well-nourished, and in no distress.  Neurological: She is alert and oriented to person, place, and time.  Psychiatric: Memory, affect and judgment normal. Her mood appears anxious. She expresses no suicidal ideation.  Nursing note and vitals reviewed.      Assessment & Plan  1. Panic disorder without agoraphobia Stable on alprazolam taken 3 times daily and at bedtime. Refills provided. - ALPRAZolam (XANAX) 0.5 MG tablet; Take 1 tablet (0.5 mg total) by mouth 3 (three) times daily as needed for anxiety.  Dispense: 90 tablet; Refill: 0 - ALPRAZolam (XANAX) 1 MG tablet; Take 1 tablet (1 mg total) by mouth at bedtime as needed for anxiety.  Dispense: 30 tablet; Refill: 0  Jeniece Hannis [facility] 07/20/2015 3:17 PM

## 2015-07-20 NOTE — Pre-Procedure Instructions (Signed)
pcr results faxed to surgeon

## 2015-07-21 NOTE — Pre-Procedure Instructions (Addendum)
PATIENT CALLED. WAS SEEN BY DR Chauncey Cruel [facility] 07/20/15 AND TOLD OK TO HAVE SURGERY BUT HAD NOT RECEIVED CLEARANCE REQUEST. REFAXED AND VERIFIED RECEIVED BY MILLISA AT THEIR OFFICE.

## 2015-07-22 ENCOUNTER — Telehealth: Payer: Self-pay | Admitting: Family Medicine

## 2015-07-22 MED ORDER — CEFAZOLIN SODIUM-DEXTROSE 2-4 GM/100ML-% IV SOLN: 2.0000 g | Freq: Once | INTRAVENOUS | Status: DC

## 2015-07-22 NOTE — Telephone Encounter (Signed)
Explained that the EKG obtained recently at the preop was different from the EKG that she had on file with [name]. Recommended that she proceed with cardiac clearance before her surgery and she verbalized understanding.

## 2015-07-22 NOTE — Pre-Procedure Instructions (Signed)
Message left on Robin's voice mail at Dr. Keith Rake office at [facility] for status update on Medical clearance.  Read Dr. Trena Platt note from pt visit on 07/20/15, no mention of clearance.

## 2015-07-22 NOTE — Pre-Procedure Instructions (Signed)
Received call from Amy at  Dr. Cherrie Gauze office, Dr. Manuella Ghazi is referring pt to a cardiologist for clearance.  Pt has been cancelled for 07/25/15, to be rescheduled when cardiac clearance is received. Amy notified pt of neecd for cardiac clearance and surgery cancellation.

## 2015-07-22 NOTE — Telephone Encounter (Signed)
PT IS ASKING WHY YOU TOLD HER WHEN SHE WAS HERE THAT EVERYTHING WAS GOOD BUT THEN SHE SAID THAT SHE GETS A CALL SAYING YOU TOLD THEM THAT SHE WOULD HAVE TO SEE A HEART DR NOW AND HER SURGERY WAS FOR Monday THE 19TH. PLEASE CALL AND EXPLAIN WHY AND WHAT IS WRONG AND YOU TOLD HER NOTHING. CALL [phone]

## 2015-07-25 ENCOUNTER — Encounter: Admission: RE | Payer: Self-pay | Source: Ambulatory Visit

## 2015-07-25 ENCOUNTER — Ambulatory Visit: Payer: Self-pay | Admitting: Family Medicine

## 2015-07-25 ENCOUNTER — Ambulatory Visit: Admission: RE | Admit: 2015-07-25 | Payer: PPO | Source: Ambulatory Visit | Admitting: Urology

## 2015-07-25 ENCOUNTER — Telehealth: Payer: Self-pay | Admitting: Family Medicine

## 2015-07-25 DIAGNOSIS — R9431 Abnormal electrocardiogram [ECG] [EKG]: Secondary | ICD-10-CM

## 2015-07-25 SURGERY — REMOVAL, NEUROSTIMULATOR, SACRAL
Anesthesia: Choice

## 2015-07-25 NOTE — Addendum Note (Signed)
Addended byManuella Ghazi, Kendry Pfarr A A on: 07/25/2015 05:15 PM   Modules accepted: Orders

## 2015-07-25 NOTE — Telephone Encounter (Signed)
Amy from [facility] states it was done during preadmit testing, however, she sent the EKG down during lunch today, so it should be in your folder for this afternoon

## 2015-07-25 NOTE — Telephone Encounter (Signed)
Amy from [facility] states that due to patient insurance the cardiologist referral has to be done by patient primary care doctor. Please send referral to Dr. Serafina Royals [facility]).

## 2015-07-25 NOTE — Telephone Encounter (Signed)
Please forward [name] the EKG obtained at the time of preop testing so I can document and use the proper diagnosis for cardiology referral

## 2015-07-26 ENCOUNTER — Telehealth: Payer: Self-pay | Admitting: Radiology

## 2015-07-26 NOTE — Telephone Encounter (Signed)
No answer & unable to LM. Need to notify pt of rescheduled surgery information.

## 2015-07-27 NOTE — Telephone Encounter (Signed)
Notified pt of Interstim removal r/s to 08/22/15, to call Friday prior to surgery for arrival time to [facility] & to follow instructions given at pre-admit testing appt on 07/19/15. Pt voices understanding.

## 2015-07-28 ENCOUNTER — Encounter: Payer: Self-pay | Admitting: Family Medicine

## 2015-08-02 ENCOUNTER — Encounter: Payer: PPO | Admitting: Internal Medicine

## 2015-08-02 DIAGNOSIS — Z872 Personal history of diseases of the skin and subcutaneous tissue: Secondary | ICD-10-CM | POA: Diagnosis not present

## 2015-08-02 DIAGNOSIS — L89312 Pressure ulcer of right buttock, stage 2: Secondary | ICD-10-CM | POA: Diagnosis not present

## 2015-08-02 DIAGNOSIS — Z09 Encounter for follow-up examination after completed treatment for conditions other than malignant neoplasm: Secondary | ICD-10-CM | POA: Diagnosis not present

## 2015-08-03 NOTE — Progress Notes (Signed)
ALEIDY, BICKER ([id number]) Visit Report for 08/02/2015 Arrival Information Details Patient Name: [name], [name]. Date of Service: 08/02/2015 8:45 AM Medical Record Patient Account Number: [id number] Number: Treating RN: Ahmed Prima 10-21-1950 (65 y.o. Other Clinician: Date of Birth/Sex: Female) Treating ROBSON, MICHAEL Primary Care Physician: [facility], SYED Physician/Extender: G Referring Physician: Keith Rake Weeks in Treatment: 6 Visit Information History Since Last Visit All ordered tests and consults were completed: No Patient Arrived: Ambulatory Added or deleted any medications: No Arrival Time: 09:08 Any new allergies or adverse reactions: No Accompanied By: self Had a fall or experienced change in No Transfer Assistance: None activities of daily living that may affect Patient Identification Verified: Yes risk of falls: Secondary Verification Process Yes Signs or symptoms of abuse/neglect since last No Completed: visito Patient Requires Transmission-Based No Hospitalized since last visit: No Precautions: Pain Present Now: No Patient Has Alerts: No Electronic Signature(s) Signed: 08/02/2015 5:01:48 PM By: Alric Quan Entered By: Alric Quan on 08/02/2015 09:08:46 [name], [name] ([id number]) -------------------------------------------------------------------------------- Clinic Level of Care Assessment Details Patient Name: Jill Gip D. Date of Service: 08/02/2015 8:45 AM Medical Record Patient Account Number: [id number] Number: Treating RN: Ahmed Prima [birth year] (65 y.o. Other Clinician: Date of Birth/Sex: Female) Treating ROBSON, [place] Primary Care Physician: [facility], [facility] Physician/Extender: G Referring Physician: Manuella Ghazi, SYED Weeks in Treatment: 6 Clinic Level of Care Assessment Items TOOL 4 Quantity Score X - Use when only an EandM is performed on FOLLOW-UP visit 1 0 ASSESSMENTS - Nursing Assessment / Reassessment X -  Reassessment of Co-morbidities (includes updates in patient status) 1 10 X - Reassessment of Adherence to Treatment Plan 1 5 ASSESSMENTS - Wound and Skin Assessment / Reassessment X - Simple Wound Assessment / Reassessment - one wound 1 5 []  - Complex Wound Assessment / Reassessment - multiple wounds 0 []  - Dermatologic / Skin Assessment (not related to wound area) 0 ASSESSMENTS - Focused Assessment []  - Circumferential Edema Measurements - multi extremities 0 []  - Nutritional Assessment / Counseling / Intervention 0 []  - Lower Extremity Assessment (monofilament, tuning fork, pulses) 0 []  - Peripheral Arterial Disease Assessment (using hand held doppler) 0 ASSESSMENTS - Ostomy and/or Continence Assessment and Care []  - Incontinence Assessment and Management 0 []  - Ostomy Care Assessment and Management (repouching, etc.) 0 PROCESS - Coordination of Care X - Simple Patient / Family Education for ongoing care 1 15 []  - Complex (extensive) Patient / Family Education for ongoing care 0 []  - Staff obtains Programmer, systems, Records, Test Results / Process Orders 0 []  - Staff telephones HHA, Nursing Homes / Clarify orders / etc 0 [name], [name] ([id number]) []  - Routine Transfer to another Facility (non-emergent condition) 0 []  - Routine Hospital Admission (non-emergent condition) 0 []  - New Admissions / Biomedical engineer / Ordering NPWT, Apligraf, etc. 0 []  - Emergency Hospital Admission (emergent condition) 0 []  - Simple Discharge Coordination 0 X - Complex (extensive) Discharge Coordination 1 15 PROCESS - Special Needs []  - Pediatric / Minor Patient Management 0 []  - Isolation Patient Management 0 []  - Hearing / Language / Visual special needs 0 []  - Assessment of Community assistance (transportation, D/C planning, etc.) 0 []  - Additional assistance / Altered mentation 0 []  - Support Surface(s) Assessment (bed, cushion, seat, etc.) 0 INTERVENTIONS - Wound Cleansing / Measurement X - Simple  Wound Cleansing - one wound 1 5 []  - Complex Wound Cleansing - multiple wounds 0 X - Wound Imaging (photographs - any number of wounds) 1  5 []  - Wound Tracing (instead of photographs) 0 []  - Simple Wound Measurement - one wound 0 []  - Complex Wound Measurement - multiple wounds 0 INTERVENTIONS - Wound Dressings []  - Small Wound Dressing one or multiple wounds 0 []  - Medium Wound Dressing one or multiple wounds 0 []  - Large Wound Dressing one or multiple wounds 0 []  - Application of Medications - topical 0 []  - Application of Medications - injection 0 Zeitlin, Bliss D. ([id number]) INTERVENTIONS - Miscellaneous []  - External ear exam 0 []  - Specimen Collection (cultures, biopsies, blood, body fluids, etc.) 0 []  - Specimen(s) / Culture(s) sent or taken to Lab for analysis 0 []  - Patient Transfer (multiple staff / Harrel Lemon Lift / Similar devices) 0 []  - Simple Staple / Suture removal (25 or less) 0 []  - Complex Staple / Suture removal (26 or more) 0 []  - Hypo / Hyperglycemic Management (close monitor of Blood Glucose) 0 []  - Ankle / Brachial Index (ABI) - do not check if billed separately 0 X - Vital Signs 1 5 Has the patient been seen at the hospital within the last three years: Yes Total Score: 65 Level Of Care: New/Established - Level 2 Electronic Signature(s) Signed: 08/02/2015 5:01:48 PM By: Alric Quan Entered By: Alric Quan on 08/02/2015 11:40:51 [name], [name] ([id number]) -------------------------------------------------------------------------------- Encounter Discharge Information Details Patient Name: Jill Gip D. Date of Service: 08/02/2015 8:45 AM Medical Record Patient Account Number: [id number] Number: Treating RN: Ahmed Prima [birth year] (65 y.o. Other Clinician: Date of Birth/Sex: Female) Treating ROBSON, MICHAEL Primary Care Physician: [facility], SYED Physician/Extender: G Referring Physician: Keith Rake Weeks in Treatment: 6 Encounter Discharge  Information Items Discharge Pain Level: 0 Discharge Condition: Stable Ambulatory Status: Ambulatory Discharge Destination: Home Transportation: Private Auto Accompanied By: self Schedule Follow-up Appointment: No Medication Reconciliation completed and provided to Patient/Care Yes Charlaine Utsey: Provided on Clinical Summary of Care: 08/02/2015 Form Type Recipient Paper Patient [facility] Electronic Signature(s) Signed: 08/02/2015 9:37:44 AM By: Ruthine Dose Entered By: Ruthine Dose on 08/02/2015 09:37:43 [name], [name] ([id number]) -------------------------------------------------------------------------------- Lower Extremity Assessment Details Patient Name: Jill Gip D. Date of Service: 08/02/2015 8:45 AM Medical Record Patient Account Number: [id number] Number: Treating RN: Ahmed Prima [birth year] (65 y.o. Other Clinician: Date of Birth/Sex: Female) Treating ROBSON, [place] Primary Care Physician: [facility], [place]: G Referring Physician: Manuella Ghazi, SYED Weeks in Treatment: 6 Electronic Signature(s) Signed: 08/02/2015 5:01:48 PM By: Alric Quan Entered By: Alric Quan on 08/02/2015 09:10:49 [name], [name] ([id number]) -------------------------------------------------------------------------------- Multi Wound Chart Details Patient Name: Jill Gip D. Date of Service: 08/02/2015 8:45 AM Medical Record Patient Account Number: [id number] Number: Treating RN: Ahmed Prima 1950/10/06 (65 y.o. Other Clinician: Date of Birth/Sex: Female) Treating ROBSON, MICHAEL Primary Care Physician: [facility], SYED Physician/Extender: G Referring Physician: [facility], SYED Weeks in Treatment: 6 Vital Signs Height(in): 62 Pulse(bpm): 99 Weight(lbs): 175 Blood Pressure 129/65 (mmHg): Body Mass Index(BMI): 32 Temperature(F): 97.6 Respiratory Rate 18 (breaths/min): Photos: [1:No Photos] [N/A:N/A] Wound Location: [1:Right Gluteus] [N/A:N/A] Wounding Event:  [1:Pressure Injury] [N/A:N/A] Primary Etiology: [1:Pressure Ulcer] [N/A:N/A] Date Acquired: [1:04/21/2015] [N/A:N/A] Weeks of Treatment: [1:6] [N/A:N/A] Wound Status: [1:Healed - Epithelialized] [N/A:N/A] Measurements L x W x D 0x0x0 [N/A:N/A] (cm) Area (cm) : [1:0] [N/A:N/A] Volume (cm) : [1:0] [N/A:N/A] % Reduction in Area: [1:100.00%] [N/A:N/A] % Reduction in Volume: 100.00% [N/A:N/A] Classification: [1:Category/Stage II] [N/A:N/A] Periwound Skin Texture: No Abnormalities Noted [N/A:N/A] Periwound Skin [1:No Abnormalities Noted] [N/A:N/A] Moisture: Periwound Skin Color: No Abnormalities Noted [N/A:N/A] Tenderness on [1:No] [N/A:N/A] Treatment Notes  Electronic Signature(s) Signed: 08/02/2015 5:01:48 PM By: Alric Quan Entered By: Alric Quan on 08/02/2015 09:35:13 [name], [name] ([id number]) [name], [name] ([id number]) -------------------------------------------------------------------------------- Multi-Disciplinary Care Plan Details Patient Name: MALESSA, HIBDON D. Date of Service: 08/02/2015 8:45 AM Medical Record Patient Account Number: [id number] Number: Treating RN: Ahmed Prima [birth year] (65 y.o. Other Clinician: Date of Birth/Sex: Female) Treating ROBSON, [place] Primary Care Physician: [facility], [place]: G Referring Physician: Keith Rake Weeks in Treatment: 6 Active Inactive Electronic Signature(s) Signed: 08/02/2015 5:01:48 PM By: Alric Quan Entered By: Alric Quan on 08/02/2015 09:35:08 [name], [name] ([id number]) -------------------------------------------------------------------------------- Pain Assessment Details Patient Name: Jill Gip D. Date of Service: 08/02/2015 8:45 AM Medical Record Patient Account Number: [id number] Number: Treating RN: Ahmed Prima 22-Apr-1950 (65 y.o. Other Clinician: Date of Birth/Sex: Female) Treating ROBSON, MICHAEL Primary Care Physician: [facility], SYED Physician/Extender:  G Referring Physician: Keith Rake Weeks in Treatment: 6 Active Problems Location of Pain Severity and Description of Pain Patient Has Paino No Site Locations With Dressing Change: No Pain Management and Medication Current Pain Management: Electronic Signature(s) Signed: 08/02/2015 5:01:48 PM By: Alric Quan Entered By: Alric Quan on 08/02/2015 09:08:53 [name], [name] ([id number]) -------------------------------------------------------------------------------- Patient/Caregiver Education Details Patient Name: Jill Gip D. Date of Service: 08/02/2015 8:45 AM Medical Record Patient Account Number: [id number] Number: Treating RN: Ahmed Prima [birth year] (65 y.o. Other Clinician: Date of Birth/Gender: Female) Treating ROBSON, MICHAEL Primary Care Physician: [facility], SYED Physician/Extender: G Referring Physician: [facility] in Treatment: 6 Education Assessment Education Provided To: Patient Education Topics Provided Wound/Skin Impairment: Other: Keep area clean and dry and keep pressure of it. Call us if you need anything or if you Handouts: have any que Methods: Demonstration, Explain/Verbal Responses: State content correctly Electronic Signature(s) Signed: 08/02/2015 5:01:48 PM By: Alric Quan Entered By: Alric Quan on 08/02/2015 09:37:04 [name], Jill Hill. ([id number]) -------------------------------------------------------------------------------- Wound Assessment Details Patient Name: Jill Gip D. Date of Service: 08/02/2015 8:45 AM Medical Record Patient Account Number: [id number] Number: Treating RN: Ahmed Prima 08/13/1950 (65 y.o. Other Clinician: Date of Birth/Sex: Female) Treating ROBSON, [place] Primary Care Physician: [facility], SYED Physician/Extender: G Referring Physician: [facility], SYED Weeks in Treatment: 6 Wound Status Wound Number: 1 Primary Etiology: Pressure Ulcer Wound Location: Right Gluteus Wound Status:  Healed - Epithelialized Wounding Event: Pressure Injury Date Acquired: 04/21/2015 Weeks Of Treatment: 6 Clustered Wound: No Photos Photo Uploaded By: Alric Quan on 08/02/2015 11:38:12 Wound Measurements Length: (cm) 0 % Reduction in Width: (cm) 0 % Reduction in Depth: (cm) 0 Area: (cm) 0 Volume: (cm) 0 Area: 100% Volume: 100% Wound Description Classification: Category/Stage II Periwound Skin Texture Texture Color No Abnormalities Noted: No No Abnormalities Noted: No Moisture No Abnormalities Noted: No Electronic Signature(s) LILLYONNA, MAUTNER ([id number]) Signed: 08/02/2015 5:01:48 PM By: Alric Quan Entered By: Alric Quan on 08/02/2015 09:34:50 Mihalko, Chivon D. ([id number]) -------------------------------------------------------------------------------- Vitals Details Patient Name: Jill Gip D. Date of Service: 08/02/2015 8:45 AM Medical Record Patient Account Number: [id number] Number: Treating RN: Ahmed Prima 04-27-1950 (65 y.o. Other Clinician: Date of Birth/Sex: Female) Treating ROBSON, MICHAEL Primary Care Physician: [facility], [place]: G Referring Physician: [facility], SYED Weeks in Treatment: 6 Vital Signs Time Taken: 09:08 Temperature (F): 97.6 Height (in): 62 Pulse (bpm): 99 Weight (lbs): 175 Respiratory Rate (breaths/min): 18 Body Mass Index (BMI): 32 Blood Pressure (mmHg): 129/65 Reference Range: 80 - 120 mg / dl Electronic Signature(s) Signed: 08/02/2015 5:01:48 PM By: Alric Quan Entered By: Alric Quan on 08/02/2015 09:10:12

## 2015-08-03 NOTE — Progress Notes (Signed)
TASFIA, HANSEN ([id number]) Visit Report for 08/02/2015 Chief Complaint Document Details Patient Name: [name], [name]. Date of Service: 08/02/2015 8:45 AM Medical Record Patient Account Number: [id number] Number: Treating RN: [name] [birth year] (65 y.o. Other Clinician: Date of Birth/Sex: Female) Treating [name] Primary Care Physician/Extender: [name] Physician: Referring Physician: Keith [name] Weeks in Treatment: 6 Information Obtained from: Patient Chief Complaint Patient arrives for review of an advertised pressure ulcer on her right buttock Electronic Signature(s) Signed: 08/03/2015 12:38:30 PM By: Jill Ham MD Entered By: [name] on 08/02/2015 09:41:14 [place], [place] ValleyMarland Kitchen ([id number]) -------------------------------------------------------------------------------- HPI Details Patient Name: Jill Gip D. Date of Service: 08/02/2015 8:45 AM Medical Record Patient Account Number: [id number] Number: Treating RN: [name] [birth year] (65 y.o. Other Clinician: Date of Birth/Sex: Female) Treating [name] Primary Care Physician/Extender: [name] Physician: Referring Physician: Keith [name] Weeks in Treatment: 6 History of Present Illness HPI Description: 06/21/15; this is a 65 year old woman who arrives with a 2 to three-month history of what his average size this is stage II pressure ulcer on her right buttock. She is referred from her her OB/GYN who is been following her closely for Lichen sclerosis. She is been applying steroid cream to the wound on her buttock which is what is been prescribed for her chronic vulvar issues. [facility] I can see reference of this to March 9 again at her gynecologist where she was listed as having an "boil of her buttock". She does not have a prior history of skin abscesses. She does have a history of psoriasis although this is been well controlled over the  last year with Cosentyx. In terms of the risk factors for pressure ulcers I really can't pick these up for. She is ambulatory. Does not give an easy history of being immobilized for a prolonged period of time/hard surfaces etc. she lives alone and seems to be reasonably independent. She had a recently elevated hemoglobin A1c of 6 but is not a known diabetic 06/28/15; culture I did of this area on her right buttock grew MRSA. This is raise my suspicion that this was initially a furuncle. It already however looks somewhat better with the Aquacel Ag 07/05/15; I think the area is better 07/12/15; the area is just about closed. She is still complaining of pain in the area 07/19/15 small area that is not closed. Very tiny surface of adherent slough which underwent debridement. 08/02/15 small areas closed totally epithelialized. There is no underlying subcutaneous involvement Electronic Signature(s) Signed: 08/03/2015 12:38:30 PM By: Jill Ham MD Entered By: [name] on 08/02/2015 09:41:42 [place], [name] ([id number]) -------------------------------------------------------------------------------- Physical Exam Details Patient Name: [name], [name] Date of Service: 08/02/2015 8:45 AM Medical Record Patient Account Number: [id number] Number: Treating RN: [name] April 04, 1950 (65 y.o. Other Clinician: Date of Birth/Sex: Female) Treating [name] Primary Care Physician/Extender: [name] Physician: Referring Physician: Manuella Ghazi, [name] Weeks in Treatment: 6 Integumentary (Hair, Skin) The areas on the right buttock near the midline. This is fully epithelialized there is no subcutaneous involvement.. Notes Wound exam the areas fully epithelialized Electronic Signature(s) Signed: 08/03/2015 12:38:30 PM By: Jill Ham MD Entered By: [name] on 08/02/2015 09:42:19 [name], [name]  ([id number]) -------------------------------------------------------------------------------- Physician Orders Details Patient Name: Jill Gip D. Date of Service: 08/02/2015 8:45 AM Medical Record Patient Account Number: [id number] Number: Treating RN: [name] 1950-09-04 (65 y.o. Other Clinician: Date of Birth/Sex: Female) Treating [name] Primary Care Physician/Extender:  [name] Physician: Referring Physician: [facility] in Treatment: 6 Verbal / Phone Orders: Yes Clinician: Carolyne [name], [name] Read Back and Verified: Yes Diagnosis Coding Discharge From [facility] o Discharge from [place] area clean and dry and keep pressure of it. Call us if you need anything or if you have any questions. Electronic Signature(s) Signed: 08/02/2015 5:01:48 PM By: [name] Signed: 08/03/2015 12:38:30 PM By: Jill Ham MD Entered By: [name] on 08/02/2015 09:36:27 [name], [name] ([id number]) -------------------------------------------------------------------------------- Problem List Details Patient Name: [name], BALEK. Date of Service: 08/02/2015 8:45 AM Medical Record Patient Account Number: [id number] Number: Treating RN: [name] 09-09-1950 (65 y.o. Other Clinician: Date of Birth/Sex: Female) Treating [name] Primary Care Physician/Extender: [name] Physician: Referring Physician: Keith [name] Weeks in Treatment: 6 Active Problems ICD-10 Encounter Code Description Active Date Diagnosis L89.312 Pressure ulcer of right buttock, stage 2 06/21/2015 Yes Inactive Problems Resolved Problems Electronic Signature(s) Signed: 08/03/2015 12:38:30 PM By: Jill Ham MD Entered By: [name] on 08/02/2015 09:41:02 [name], [name] ([id number]) -------------------------------------------------------------------------------- Progress Note Details Patient Name: Jill Gip D. Date of Service:  08/02/2015 8:45 AM Medical Record Patient Account Number: [id number] Number: Treating RN: [name] 05-Oct-1950 (65 y.o. Other Clinician: Date of Birth/Sex: Female) Treating [name] Primary Care Physician/Extender: [name] Physician: Referring Physician: Keith [name] Weeks in Treatment: 6 Subjective Chief Complaint Information obtained from Patient Patient arrives for review of an advertised pressure ulcer on her right buttock History of Present Illness (HPI) 06/21/15; this is a 65 year old woman who arrives with a 2 to three-month history of what his average size this is stage II pressure ulcer on her right buttock. She is referred from her her OB/GYN who is been following her closely for Lichen sclerosis. She is been applying steroid cream to the wound on her buttock which is what is been prescribed for her chronic vulvar issues. [facility] I can see reference of this to March 9 again at her gynecologist where she was listed as having an "boil of her buttock". She does not have a prior history of skin abscesses. She does have a history of psoriasis although this is been well controlled over the last year with Cosentyx. In terms of the risk factors for pressure ulcers I really can't pick these up for. She is ambulatory. Does not give an easy history of being immobilized for a prolonged period of time/hard surfaces etc. she lives alone and seems to be reasonably independent. She had a recently elevated hemoglobin A1c of 6 but is not a known diabetic 06/28/15; culture I did of this area on her right buttock grew MRSA. This is raise my suspicion that this was initially a furuncle. It already however looks somewhat better with the Aquacel Ag 07/05/15; I think the area is better 07/12/15; the area is just about closed. She is still complaining of pain in the area 07/19/15 small area that is not closed. Very tiny surface of adherent slough which  underwent debridement. 08/02/15 small areas closed totally epithelialized. There is no underlying subcutaneous involvement Objective Constitutional Vitals Time Taken: 9:08 AM, Height: 62 in, Weight: 175 lbs, BMI: 32, Temperature: 97.6 F, Pulse: 99 bpm, Respiratory Rate: 18 breaths/min, Blood Pressure: 129/65 mmHg. [name], [name] ([id number]) General Notes: Wound exam the areas fully epithelialized Integumentary (Hair, Skin) The areas on the right buttock near the midline. This is fully epithelialized there is no subcutaneous involvement.Marland Kitchen  Wound #1 status is Healed - Epithelialized. Original cause of wound was Pressure Injury. The wound is located on the Right Gluteus. The wound measures 0cm length x 0cm width x 0cm depth; 0cm^2 area and 0cm^3 volume. Assessment Active Problems ICD-10 L89.312 - Pressure ulcer of right buttock, stage 2 Plan Discharge From [facility] Services: Discharge from [place] area clean and dry and keep pressure of it. Call us if you need anything or if you have any questions. #1 the patient to be discharged from the clinic and #2 path a little bit about secondary prevention pressure-relief etc. although I think this may have started as a staph skin infection i.e. furuncle Electronic Signature(s) Signed: 08/03/2015 12:38:30 PM By: Jill Ham MD Entered By: [name] on 08/02/2015 09:43:07 Gick, [name]. ([id number]) -------------------------------------------------------------------------------- SuperBill Details Patient Name: Jill Gip D. Date of Service: 08/02/2015 Medical Record Patient Account Number: [id number] Number: Treating RN: [name] May 15, 1950 (65 y.o. Other Clinician: Date of Birth/Sex: Female) Treating Willistine Ferrall Primary Care Physician/Extender: [name] Physician: Suella [place] in Treatment: 6 Referring Physician: [facility], [name] Diagnosis Coding ICD-10 Codes Code Description [phone] Pressure ulcer of  right buttock, stage 2 Facility Procedures CPT4 Code: [id number] Description: [phone] - WOUND CARE VISIT-LEV 2 EST PT Modifier: Quantity: 1 Physician Procedures CPT4 Code: [id number] Description: [id number] - WC PHYS LEVEL 2 - EST PT ICD-10 Description Diagnosis L89.312 Pressure ulcer of right buttock, stage 2 Modifier: Quantity: 1 Electronic Signature(s) Signed: 08/02/2015 5:01:48 PM By: [name] Signed: 08/03/2015 12:38:30 PM By: Jill Ham MD Entered By: [name] on 08/02/2015 11:40:57

## 2015-08-04 DIAGNOSIS — E782 Mixed hyperlipidemia: Secondary | ICD-10-CM | POA: Diagnosis not present

## 2015-08-04 DIAGNOSIS — R0602 Shortness of breath: Secondary | ICD-10-CM | POA: Diagnosis not present

## 2015-08-04 DIAGNOSIS — I10 Essential (primary) hypertension: Secondary | ICD-10-CM | POA: Insufficient documentation

## 2015-08-04 DIAGNOSIS — I70219 Atherosclerosis of native arteries of extremities with intermittent claudication, unspecified extremity: Secondary | ICD-10-CM | POA: Insufficient documentation

## 2015-08-04 DIAGNOSIS — E1169 Type 2 diabetes mellitus with other specified complication: Secondary | ICD-10-CM | POA: Insufficient documentation

## 2015-08-04 DIAGNOSIS — I739 Peripheral vascular disease, unspecified: Secondary | ICD-10-CM | POA: Diagnosis not present

## 2015-08-11 ENCOUNTER — Ambulatory Visit: Payer: Self-pay | Admitting: Family Medicine

## 2015-08-11 NOTE — Pre-Procedure Instructions (Signed)
FOR STRESS/ECHO 08/18/15,CAROTID TEST AND SEEING KOWALSKI 08/19/15 FOR CLEARANCE

## 2015-08-15 ENCOUNTER — Telehealth: Payer: Self-pay

## 2015-08-15 DIAGNOSIS — I10 Essential (primary) hypertension: Secondary | ICD-10-CM

## 2015-08-15 MED ORDER — AMLODIPINE BESYLATE 5 MG PO TABS: 5.0000 mg | ORAL_TABLET | Freq: Every day | ORAL | Status: DC

## 2015-08-15 NOTE — Telephone Encounter (Signed)
Amlodipine 5 mg has been refilled and sent to [facility]

## 2015-08-16 ENCOUNTER — Ambulatory Visit (INDEPENDENT_AMBULATORY_CARE_PROVIDER_SITE_OTHER): Payer: PPO | Admitting: Family Medicine

## 2015-08-16 ENCOUNTER — Encounter: Payer: Self-pay | Admitting: Family Medicine

## 2015-08-16 VITALS — BP 129/68 | HR 112 | Temp 97.5°F | Resp 17 | Ht 63.0 in | Wt 177.9 lb

## 2015-08-16 DIAGNOSIS — F410 Panic disorder [episodic paroxysmal anxiety] without agoraphobia: Secondary | ICD-10-CM | POA: Diagnosis not present

## 2015-08-16 MED ORDER — ALPRAZOLAM 0.5 MG PO TABS
0.5000 mg | ORAL_TABLET | Freq: Three times a day (TID) | ORAL | Status: DC | PRN
Start: 2015-08-16 — End: 2015-09-15

## 2015-08-16 MED ORDER — ALPRAZOLAM 1 MG PO TABS: 1.0000 mg | ORAL_TABLET | Freq: Every evening | ORAL | Status: DC | PRN

## 2015-08-16 NOTE — Progress Notes (Signed)
Name: [name]   MRN: [id number]    DOB: Jul 12, 1950   Date:08/16/2015       Progress Note  Subjective  Chief Complaint  Chief Complaint  Patient presents with  . Follow-up    1 mo  . Medication Refill    xanax    HPI  Anxiety and Panic Attacks:  Pt. Presents for refill and follow up on anxiety. Has history of anxiety and panic attacks, symptoms controlled on Alprazolam 0.5 mg three times daily as needed and 1 mg at bedtime.   Past Medical History  Diagnosis Date  . GERD (gastroesophageal reflux disease)   . Hypertension   . Chronic back pain   . Anxiety   . Depression   . Thyroid disease   . Psoriasis   . Pneumonia     RECENT X 3  . Arthritis   . Hypothyroidism   . PONV (postoperative nausea and vomiting)     Past Surgical History  Procedure Laterality Date  . Bladder stimulator    . Hand surgery Right   . Oophorectomy Right     ovarian cyst  . Abdominal hysterectomy      heavy bleeding  . Cataract extraction w/phaco Right 03/07/2015    Procedure: CATARACT EXTRACTION PHACO AND INTRAOCULAR LENS PLACEMENT (IOC);  Surgeon: Estill Cotta, MD;  Location: [facility] ORS;  Service: Ophthalmology;  Laterality: Right;  [name] 01:14 AP% 26.4 CDE 33.04 fluid pack lot # [id number]  . Cataract extraction w/phaco Left 03/21/2015    Procedure: CATARACT EXTRACTION PHACO AND INTRAOCULAR LENS PLACEMENT (IOC);  Surgeon: Estill Cotta, MD;  Location: [facility] ORS;  Service: Ophthalmology;  Laterality: Left;  [name] 01:21 AP% 25.6 CDE 39.78 fluid pack lot # [id number]  . Carpal tunnel release      Family History  Problem Relation Age of Onset  . Heart disease Mother   . COPD Father     Social History   Social History  . Marital Status: Widowed    Spouse Name: N/A  . Number of Children: N/A  . Years of Education: N/A   Occupational History  . Not on file.   Social History Main Topics  . Smoking status: Current Every Day Smoker -- 1.00 packs/day    Types: Cigarettes  .  Smokeless tobacco: Never Used  . Alcohol Use: No  . Drug Use: No  . Sexual Activity: No   Other Topics Concern  . Not on file   Social History Narrative     Current outpatient prescriptions:  .  acetaminophen (TYLENOL) 500 MG tablet, Take 500 mg by mouth every 6 (six) hours as needed for moderate pain. Reported on 07/01/2015, Disp: , Rfl:  .  ALPRAZolam (XANAX) 0.5 MG tablet, Take 1 tablet (0.5 mg total) by mouth 3 (three) times daily as needed for anxiety., Disp: 90 tablet, Rfl: 0 .  ALPRAZolam (XANAX) 1 MG tablet, Take 1 tablet (1 mg total) by mouth at bedtime as needed for anxiety., Disp: 30 tablet, Rfl: 0 .  amLODipine (NORVASC) 5 MG tablet, Take 1 tablet (5 mg total) by mouth daily., Disp: 90 tablet, Rfl: 0 .  citalopram (CELEXA) 20 MG tablet, Take 1 tablet (20 mg total) by mouth daily., Disp: 90 tablet, Rfl: 0 .  clobetasol ointment (TEMOVATE) [id number] %, Apply 1 application topically daily. Use twice a day for 2 weeks, then daily for 4 weeks., Disp: 30 g, Rfl: 3 .  levothyroxine (SYNTHROID, LEVOTHROID) 100 MCG tablet, Take 1 tablet (100 mcg  total) by mouth daily before breakfast., Disp: 90 tablet, Rfl: 0 .  lisinopril (PRINIVIL,ZESTRIL) 10 MG tablet, Take 0.5 tablets (5 mg total) by mouth every morning. Take 5 mg every morning. (Patient taking differently: Take 10 mg by mouth every morning. Take 5 mg every morning.), Disp: 90 tablet, Rfl: 0 .  Secukinumab (COSENTYX [place]), Inject into the skin every 30 (thirty) days. 2 injections per month, Disp: , Rfl:   Allergies  Allergen Reactions  . Ciprofloxacin Hives and Diarrhea  . Oxycodone Diarrhea  . Oxycodone-Acetaminophen Diarrhea  . Oxycodone-Acetaminophen Diarrhea  . Sulfa Antibiotics Rash    Throat, mouth     Review of Systems  Psychiatric/Behavioral: Positive for depression. The patient is nervous/anxious.       Objective  Filed Vitals:   08/16/15 1330  BP: 129/68  Pulse: 112  Temp: 97.5 F (36.4 C)  TempSrc: Oral   Resp: 17  Height: 5\' 3"  (1.6 m)  Weight: 177 lb 14.4 oz (80.695 kg)  SpO2: 93%    Physical Exam  Constitutional: She is oriented to person, place, and time and well-developed, well-nourished, and in no distress.  Neurological: She is alert and oriented to person, place, and time.  Psychiatric: Memory and judgment normal. Her mood appears anxious.  Nursing note and vitals reviewed.     Assessment & Plan  1. Panic disorder without agoraphobia Symptoms controlled on therapy, refills provided - ALPRAZolam (XANAX) 0.5 MG tablet; Take 1 tablet (0.5 mg total) by mouth 3 (three) times daily as needed for anxiety.  Dispense: 90 tablet; Refill: 0 - ALPRAZolam (XANAX) 1 MG tablet; Take 1 tablet (1 mg total) by mouth at bedtime as needed for anxiety.  Dispense: 30 tablet; Refill: 0   [name] Asad A. [facility] 08/16/2015 1:38 PM

## 2015-08-18 DIAGNOSIS — R0602 Shortness of breath: Secondary | ICD-10-CM | POA: Diagnosis not present

## 2015-08-19 DIAGNOSIS — E782 Mixed hyperlipidemia: Secondary | ICD-10-CM | POA: Diagnosis not present

## 2015-08-19 DIAGNOSIS — I6523 Occlusion and stenosis of bilateral carotid arteries: Secondary | ICD-10-CM | POA: Diagnosis not present

## 2015-08-19 DIAGNOSIS — I739 Peripheral vascular disease, unspecified: Secondary | ICD-10-CM | POA: Diagnosis not present

## 2015-08-19 DIAGNOSIS — R0602 Shortness of breath: Secondary | ICD-10-CM | POA: Diagnosis not present

## 2015-08-22 DIAGNOSIS — E782 Mixed hyperlipidemia: Secondary | ICD-10-CM | POA: Diagnosis not present

## 2015-08-22 DIAGNOSIS — I6523 Occlusion and stenosis of bilateral carotid arteries: Secondary | ICD-10-CM | POA: Insufficient documentation

## 2015-08-22 DIAGNOSIS — R0602 Shortness of breath: Secondary | ICD-10-CM | POA: Diagnosis not present

## 2015-08-22 DIAGNOSIS — I10 Essential (primary) hypertension: Secondary | ICD-10-CM | POA: Diagnosis not present

## 2015-08-23 NOTE — Pre-Procedure Instructions (Addendum)
CLEARED BY DR Nehemiah Massed 08/23/15 Impression  Normal Lexiscan infusion EKG Normal myocardial perfusion without evidence of myocardial ischemia Low risk scan  Serafina Royals   Result Narrative  CARDIOLOGY DEPARTMENT [facility] [address] [place], [place]  [address] [phone]  Procedure: Pharmacologic Myocardial Perfusion Imaging   ONE day procedure  Indication: No diagnosis found. Ordering Physician:   Dr. Serafina Royals   Clinical History: 65 y.o. year old female Vitals: Height: 84 in Weight: 177 lb Cardiac risk factors include:    PVD, Smoking, Hyperlipidemia and HTN    Procedure:  Pharmacologic stress testing was performed with Regadenoson using a single  use 0.4mg /19ml (0.08 mg/ml) prefilled syringe intravenously infused as a  bolus dose. The stress test was stopped due to Infusion completion.  Blood  pressure response was normal.     Rest HR: 80bpm Rest BP: 126/54mmHg Max HR: 102bpm Min BP: 130/59mmHg  Stress Test Administered by: Oswald Hillock, CMA   ECG Interpretation: Rest ECG:  normal sinus rhythm, none Stress ECG:  normal sinus rhythm,   Recovery ECG:  normal sinus rhythm ECG Interpretation:  non-diagnostic due to pharmacologic testing.   Administrations This Visit    regadenoson (LEXISCAN) 0.4 mg/5 mL inj syringe 0.4 mg    Admin Date Action Dose Route Administered By      08/22/2015 Given 0.4 mg Intravenous Sharmon Leyden Deangelo            technetium Tc83m sestamibi (CARDIOLITE) injection 0000000 millicurie    Admin Date Action Dose Route Administered By      [id number] Given 0000000 millicurie Intravenous Vinnie Level L Deangelo            technetium Tc55m sestamibi (CARDIOLITE) injection [id number] millicurie    Admin Date Action Dose Route Administered By      08/22/2015 Given [id number] millicurie Intravenous Joshua B Hyler, CNMT              Gated post-stress perfusion imaging was performed 30  minutes after stress.  Rest images were performed 30 minutes after injection.  Gated LV Analysis:  TID Ratio: 1.2  LVEF= 73%  FINDINGS: Regional wall motion:  reveals normal myocardial thickening and wall  motion. The overall quality of the study is fair.   Artifacts noted: no Left ventricular cavity: normal.  Perfusion Analysis:  SPECT images demonstrate homogeneous tracer  distribution throughout the myocardium.     Status Results Details   Encounter Summary

## 2015-08-23 NOTE — Telephone Encounter (Signed)
Unable to do surgery on 08/22/15 due to incomplete cardiac clearance. Clearance has since been obtained.  Notified pt of surgery r/s to 09/07/15 & to call day prior to surgery for arrival time to SDS. Advised that pre-admission testing office will call to follow up prior to surgery. Pt voices understanding.

## 2015-08-31 ENCOUNTER — Other Ambulatory Visit: Payer: Self-pay | Admitting: Emergency Medicine

## 2015-08-31 DIAGNOSIS — I10 Essential (primary) hypertension: Secondary | ICD-10-CM

## 2015-09-01 ENCOUNTER — Telehealth: Payer: Self-pay | Admitting: Family Medicine

## 2015-09-01 DIAGNOSIS — I10 Essential (primary) hypertension: Secondary | ICD-10-CM

## 2015-09-01 MED ORDER — LISINOPRIL 10 MG PO TABS: 5.0000 mg | ORAL_TABLET | Freq: Every morning | ORAL | 0 refills | Status: DC

## 2015-09-02 NOTE — Telephone Encounter (Signed)
Pt notifed.

## 2015-09-05 ENCOUNTER — Telehealth: Payer: Self-pay | Admitting: Family Medicine

## 2015-09-07 ENCOUNTER — Encounter: Payer: Self-pay | Admitting: *Deleted

## 2015-09-07 ENCOUNTER — Ambulatory Visit: Payer: PPO | Admitting: Anesthesiology

## 2015-09-07 ENCOUNTER — Ambulatory Visit
Admission: RE | Admit: 2015-09-07 | Discharge: 2015-09-07 | Disposition: A | Discharge status: Home / Self Care | Payer: PPO | Source: Ambulatory Visit | Attending: Urology | Admitting: Urology

## 2015-09-07 ENCOUNTER — Encounter
Admission: RE | Disposition: A | Discharge status: Home / Self Care | Payer: Self-pay | Source: Ambulatory Visit | Attending: Urology

## 2015-09-07 ENCOUNTER — Ambulatory Visit: Payer: PPO

## 2015-09-07 DIAGNOSIS — Z881 Allergy status to other antibiotic agents status: Secondary | ICD-10-CM | POA: Insufficient documentation

## 2015-09-07 DIAGNOSIS — I10 Essential (primary) hypertension: Secondary | ICD-10-CM | POA: Diagnosis not present

## 2015-09-07 DIAGNOSIS — Z9889 Other specified postprocedural states: Secondary | ICD-10-CM | POA: Insufficient documentation

## 2015-09-07 DIAGNOSIS — E039 Hypothyroidism, unspecified: Secondary | ICD-10-CM | POA: Insufficient documentation

## 2015-09-07 DIAGNOSIS — Z90710 Acquired absence of both cervix and uterus: Secondary | ICD-10-CM | POA: Insufficient documentation

## 2015-09-07 DIAGNOSIS — F419 Anxiety disorder, unspecified: Secondary | ICD-10-CM | POA: Insufficient documentation

## 2015-09-07 DIAGNOSIS — M1990 Unspecified osteoarthritis, unspecified site: Secondary | ICD-10-CM | POA: Insufficient documentation

## 2015-09-07 DIAGNOSIS — T85111A Breakdown (mechanical) of implanted electronic neurostimulator (electrode) of peripheral nerve, initial encounter: Secondary | ICD-10-CM | POA: Insufficient documentation

## 2015-09-07 DIAGNOSIS — F17210 Nicotine dependence, cigarettes, uncomplicated: Secondary | ICD-10-CM | POA: Insufficient documentation

## 2015-09-07 DIAGNOSIS — D490 Neoplasm of unspecified behavior of digestive system: Secondary | ICD-10-CM | POA: Diagnosis not present

## 2015-09-07 DIAGNOSIS — Z9842 Cataract extraction status, left eye: Secondary | ICD-10-CM | POA: Diagnosis not present

## 2015-09-07 DIAGNOSIS — Z961 Presence of intraocular lens: Secondary | ICD-10-CM | POA: Insufficient documentation

## 2015-09-07 DIAGNOSIS — L409 Psoriasis, unspecified: Secondary | ICD-10-CM | POA: Diagnosis not present

## 2015-09-07 DIAGNOSIS — Z4589 Encounter for adjustment and management of other implanted devices: Secondary | ICD-10-CM | POA: Diagnosis not present

## 2015-09-07 DIAGNOSIS — Z90721 Acquired absence of ovaries, unilateral: Secondary | ICD-10-CM | POA: Diagnosis not present

## 2015-09-07 DIAGNOSIS — K219 Gastro-esophageal reflux disease without esophagitis: Secondary | ICD-10-CM | POA: Diagnosis not present

## 2015-09-07 DIAGNOSIS — N3941 Urge incontinence: Secondary | ICD-10-CM | POA: Diagnosis not present

## 2015-09-07 DIAGNOSIS — T85112A Breakdown (mechanical) of implanted electronic neurostimulator (electrode) of spinal cord, initial encounter: Secondary | ICD-10-CM | POA: Diagnosis not present

## 2015-09-07 DIAGNOSIS — X58XXXA Exposure to other specified factors, initial encounter: Secondary | ICD-10-CM | POA: Diagnosis not present

## 2015-09-07 DIAGNOSIS — Z825 Family history of asthma and other chronic lower respiratory diseases: Secondary | ICD-10-CM | POA: Diagnosis not present

## 2015-09-07 DIAGNOSIS — M549 Dorsalgia, unspecified: Secondary | ICD-10-CM | POA: Insufficient documentation

## 2015-09-07 DIAGNOSIS — Z885 Allergy status to narcotic agent status: Secondary | ICD-10-CM | POA: Diagnosis not present

## 2015-09-07 DIAGNOSIS — Z882 Allergy status to sulfonamides status: Secondary | ICD-10-CM | POA: Insufficient documentation

## 2015-09-07 DIAGNOSIS — F329 Major depressive disorder, single episode, unspecified: Secondary | ICD-10-CM | POA: Diagnosis not present

## 2015-09-07 DIAGNOSIS — G8929 Other chronic pain: Secondary | ICD-10-CM | POA: Insufficient documentation

## 2015-09-07 DIAGNOSIS — T85193A Other mechanical complication of implanted electronic neurostimulator, generator, initial encounter: Secondary | ICD-10-CM | POA: Diagnosis not present

## 2015-09-07 DIAGNOSIS — Z9841 Cataract extraction status, right eye: Secondary | ICD-10-CM | POA: Diagnosis not present

## 2015-09-07 DIAGNOSIS — Z4542 Encounter for adjustment and management of neuropacemaker (brain) (peripheral nerve) (spinal cord): Secondary | ICD-10-CM | POA: Diagnosis not present

## 2015-09-07 DIAGNOSIS — F418 Other specified anxiety disorders: Secondary | ICD-10-CM | POA: Diagnosis not present

## 2015-09-07 DIAGNOSIS — Z79899 Other long term (current) drug therapy: Secondary | ICD-10-CM | POA: Diagnosis not present

## 2015-09-07 HISTORY — PX: INTERSTIM IMPLANT REMOVAL: SHX5131

## 2015-09-07 SURGERY — REMOVAL, NEUROSTIMULATOR, SACRAL
Anesthesia: General

## 2015-09-07 MED ORDER — LIDOCAINE HCL (CARDIAC) 20 MG/ML IV SOLN
INTRAVENOUS | Status: DC | PRN
  Administered 2015-09-07: 40 mg via INTRAVENOUS

## 2015-09-07 MED ORDER — ONDANSETRON HCL 4 MG/2ML IJ SOLN
INTRAMUSCULAR | Status: DC | PRN
  Administered 2015-09-07: 4 mg via INTRAVENOUS

## 2015-09-07 MED ORDER — FENTANYL CITRATE (PF) 100 MCG/2ML IJ SOLN
INTRAMUSCULAR | Status: AC
  Filled 2015-09-07: qty 2

## 2015-09-07 MED ORDER — HYDROCODONE-ACETAMINOPHEN 5-325 MG PO TABS
ORAL_TABLET | ORAL | Status: AC
  Filled 2015-09-07: qty 2

## 2015-09-07 MED ORDER — LIDOCAINE-EPINEPHRINE (PF) 1 %-1:200000 IJ SOLN
INTRAMUSCULAR | Status: DC | PRN
  Administered 2015-09-07: 16 mL

## 2015-09-07 MED ORDER — FENTANYL CITRATE (PF) 100 MCG/2ML IJ SOLN
25.0000 ug | INTRAMUSCULAR | Status: DC | PRN
  Administered 2015-09-07 (×4): 25 ug via INTRAVENOUS

## 2015-09-07 MED ORDER — FAMOTIDINE 20 MG PO TABS
ORAL_TABLET | ORAL | Status: AC
  Filled 2015-09-07: qty 1

## 2015-09-07 MED ORDER — CLINDAMYCIN PHOSPHATE 600 MG/50ML IV SOLN
600.0000 mg | Freq: Once | INTRAVENOUS | Status: AC
Start: 2015-09-07 — End: 2015-09-07
  Administered 2015-09-07: 600 mg via INTRAVENOUS

## 2015-09-07 MED ORDER — LIDOCAINE-EPINEPHRINE (PF) 1 %-1:200000 IJ SOLN
INTRAMUSCULAR | Status: AC
  Filled 2015-09-07: qty 30

## 2015-09-07 MED ORDER — DEXAMETHASONE SODIUM PHOSPHATE 4 MG/ML IJ SOLN
INTRAMUSCULAR | Status: DC | PRN
  Administered 2015-09-07 (×2): 4 mg via INTRAVENOUS

## 2015-09-07 MED ORDER — MIDAZOLAM HCL 2 MG/2ML IJ SOLN
INTRAMUSCULAR | Status: DC | PRN
  Administered 2015-09-07: .5 mg via INTRAVENOUS
  Administered 2015-09-07: 1 mg via INTRAVENOUS
  Administered 2015-09-07: .5 mg via INTRAVENOUS

## 2015-09-07 MED ORDER — LACTATED RINGERS IV SOLN
INTRAVENOUS | Status: DC
  Administered 2015-09-07 (×2): via INTRAVENOUS

## 2015-09-07 MED ORDER — FAMOTIDINE 20 MG PO TABS
20.0000 mg | ORAL_TABLET | Freq: Once | ORAL | Status: AC
  Administered 2015-09-07: 20 mg via ORAL

## 2015-09-07 MED ORDER — BUPIVACAINE-EPINEPHRINE (PF) 0.5% -1:200000 IJ SOLN
INTRAMUSCULAR | Status: AC
  Filled 2015-09-07: qty 30

## 2015-09-07 MED ORDER — CLINDAMYCIN PHOSPHATE 600 MG/50ML IV SOLN
INTRAVENOUS | Status: AC
  Filled 2015-09-07: qty 50

## 2015-09-07 MED ORDER — PROPOFOL 500 MG/50ML IV EMUL
INTRAVENOUS | Status: DC | PRN
  Administered 2015-09-07: 50 ug/kg/min via INTRAVENOUS

## 2015-09-07 MED ORDER — ONDANSETRON HCL 4 MG/2ML IJ SOLN: 4.0000 mg | Freq: Once | INTRAMUSCULAR | Status: DC | PRN

## 2015-09-07 MED ORDER — HYDROCODONE-ACETAMINOPHEN 5-325 MG PO TABS
2.0000 | ORAL_TABLET | Freq: Once | ORAL | Status: AC
  Administered 2015-09-07: 2 via ORAL

## 2015-09-07 MED ORDER — FENTANYL CITRATE (PF) 100 MCG/2ML IJ SOLN
INTRAMUSCULAR | Status: DC | PRN
  Administered 2015-09-07: 50 ug via INTRAVENOUS

## 2015-09-07 SURGICAL SUPPLY — 40 items
BLADE SURG 15 STRL LF DISP TIS (BLADE) ×1 IMPLANT
BLADE SURG 15 STRL SS (BLADE) ×2
CLOTH BEACON ORANGE TIMEOUT ST (SAFETY) IMPLANT
COVER MAYO STAND STRL (DRAPES) ×3 IMPLANT
DRAPE C-ARM 42X72 X-RAY (DRAPES) ×6 IMPLANT
DRAPE C-ARMOR (DRAPES) ×3 IMPLANT
DRAPE INCISE 23X17 IOBAN STRL (DRAPES) ×2
DRAPE INCISE IOBAN 23X17 STRL (DRAPES) ×1 IMPLANT
DRAPE LAPAROSCOPIC ABDOMINAL (DRAPES) ×3 IMPLANT
DRAPE TABLE BACK 80X90 (DRAPES) ×3 IMPLANT
DRSG TEGADERM 4X4.75 (GAUZE/BANDAGES/DRESSINGS) IMPLANT
DRSG TELFA 3X8 NADH (GAUZE/BANDAGES/DRESSINGS) ×3 IMPLANT
ELECT REM PT RETURN 9FT ADLT (ELECTROSURGICAL)
ELECTRODE REM PT RTRN 9FT ADLT (ELECTROSURGICAL) IMPLANT
GAUZE SPONGE 4X4 12PLY STRL LF (GAUZE/BANDAGES/DRESSINGS) IMPLANT
GLOVE BIO SURGEON STRL SZ7.5 (GLOVE) ×12 IMPLANT
GOWN STRL REUS W/ TWL LRG LVL3 (GOWN DISPOSABLE) ×1 IMPLANT
GOWN STRL REUS W/ TWL XL LVL3 (GOWN DISPOSABLE) ×1 IMPLANT
GOWN STRL REUS W/TWL LRG LVL3 (GOWN DISPOSABLE) ×2
GOWN STRL REUS W/TWL XL LVL3 (GOWN DISPOSABLE) ×2
KIT ROOM TURNOVER WOR (KITS) ×3 IMPLANT
LIQUID BAND (GAUZE/BANDAGES/DRESSINGS) ×3 IMPLANT
MANIFOLD NEPTUNE II (INSTRUMENTS) IMPLANT
NEEDLE HYPO 22GX1.5 SAFETY (NEEDLE) IMPLANT
NS IRRIG 500ML POUR BTL (IV SOLUTION) ×3 IMPLANT
PACK BASIN DAY SURGERY FS (CUSTOM PROCEDURE TRAY) ×3 IMPLANT
PENCIL BUTTON HOLSTER BLD 10FT (ELECTRODE) IMPLANT
STAPLER VISISTAT 35W (STAPLE) IMPLANT
SUT VIC AB 0 CT1 36 (SUTURE) ×3 IMPLANT
SUT VIC AB 2-0 UR5 27 (SUTURE) IMPLANT
SUT VIC AB 3-0 SH 27 (SUTURE) ×2
SUT VIC AB 3-0 SH 27X BRD (SUTURE) ×1 IMPLANT
SUT VICRYL 4-0 PS2 18IN ABS (SUTURE) ×3 IMPLANT
SYR BULB IRRIG 60ML STRL (SYRINGE) ×3 IMPLANT
SYR CONTROL 10ML (SYRINGE) IMPLANT
TOWEL OR 17X24 6PK STRL BLUE (TOWEL DISPOSABLE) ×6 IMPLANT
TRAY DSU PREP LF (CUSTOM PROCEDURE TRAY) ×3 IMPLANT
TUBING CONNECTING 10 (TUBING) IMPLANT
TUBING CONNECTING 10' (TUBING)
WATER STERILE IRR 500ML POUR (IV SOLUTION) ×3 IMPLANT

## 2015-09-07 NOTE — Progress Notes (Signed)
CV: normal skin turgor and HR normal Resp: chest clear with normal respirations

## 2015-09-07 NOTE — Transfer of Care (Signed)
Immediate Anesthesia Transfer of Care Note  Patient: [name]  Procedure(s) Performed: Procedure(s): REMOVAL OF INTERSTIM IMPLANT (N/A)  Patient Location: PACU  Anesthesia Type:MAC  Level of Consciousness: awake  Airway & Oxygen Therapy: Patient Spontanous Breathing and Patient connected to nasal cannula oxygen  Post-op Assessment: Report given to RN and Post -op Vital signs reviewed and stable  Post vital signs: Reviewed and stable  Last Vitals:  Vitals:   09/07/15 1113  BP: 134/72  Pulse: 84  Resp: 16  Temp: 36.5 C    Last Pain:  Vitals:   09/07/15 1113  TempSrc: Oral  PainSc: 3          Complications: No apparent anesthesia complications

## 2015-09-07 NOTE — Interval H&P Note (Signed)
History and Physical Interval Note:  09/07/2015 11:35 AM  [name]  has presented today for surgery, with the diagnosis of malfunctioning interstim device  The various methods of treatment have been discussed with the patient and family. After consideration of risks, benefits and other options for treatment, the patient has consented to  Procedure(s): REMOVAL OF INTERSTIM IMPLANT (N/A) as a surgical intervention .  The patient's history has been reviewed, patient examined, no change in status, stable for surgery.  I have reviewed the patient's chart and labs.  Questions were answered to the patient's satisfaction.     [name]

## 2015-09-07 NOTE — Anesthesia Procedure Notes (Signed)
Procedure Name: MAC Date/Time: 09/07/2015 12:45 PM Performed by: Johnna Acosta Pre-anesthesia Checklist: Suction available, Patient identified, Emergency Drugs available, Timeout performed and Patient being monitored Patient Re-evaluated:Patient Re-evaluated prior to inductionOxygen Delivery Method: Nasal cannula Intubation Type: IV induction

## 2015-09-07 NOTE — Discharge Instructions (Signed)
AMBULATORY SURGERY  DISCHARGE INSTRUCTIONS   1) The drugs that you were given will stay in your system until tomorrow so for the next 24 hours you should not:  A) Drive an automobile B) Make any legal decisions C) Drink any alcoholic beverage   2) You may resume regular meals tomorrow.  Today it is better to start with liquids and gradually work up to solid foods.  You may eat anything you prefer, but it is better to start with liquids, then soup and crackers, and gradually work up to solid foods.   3) Please notify your doctor immediately if you have any unusual bleeding, trouble breathing, redness and pain at the surgery site, drainage, fever, or pain not relieved by medication.    4) Additional Instructions:        Please contact your physician with any problems or Same Day Surgery at [phone], Monday through Friday 6 am to 4 pm, or [place] number at [phone].I have reviewed discharge instructions in detail with the patient. They will follow-up with me or their physician as scheduled. My nurse will also be calling the patients as per protocol.

## 2015-09-07 NOTE — Anesthesia Preprocedure Evaluation (Signed)
Anesthesia Evaluation  Patient identified by MRN, date of birth, ID band Patient awake    Reviewed: Allergy & Precautions, NPO status , Patient's Chart, lab work & pertinent test results, reviewed documented beta blocker date and time   History of Anesthesia Complications (+) PONV and history of anesthetic complications  Airway Mallampati: II  TM Distance: >3 FB     Dental  (+) Chipped   Pulmonary pneumonia, resolved, Current Smoker,           Cardiovascular hypertension,      Neuro/Psych  Headaches, PSYCHIATRIC DISORDERS Anxiety Depression    GI/Hepatic GERD  Controlled,  Endo/Other  Hypothyroidism   Renal/GU      Musculoskeletal  (+) Arthritis ,   Abdominal   Peds  Hematology   Anesthesia Other Findings   Reproductive/Obstetrics                             Anesthesia Physical Anesthesia Plan  ASA: III  Anesthesia Plan: General   Post-op Pain Management:    Induction: Intravenous  Airway Management Planned: Oral ETT  Additional Equipment:   Intra-op Plan:   Post-operative Plan:   Informed Consent: I have reviewed the patients History and Physical, chart, labs and discussed the procedure including the risks, benefits and alternatives for the proposed anesthesia with the patient or authorized representative who has indicated his/her understanding and acceptance.     Plan Discussed with: CRNA  Anesthesia Plan Comments:         Anesthesia Quick Evaluation

## 2015-09-07 NOTE — H&P (Signed)
HPI: The patient by the referral note hasn't InterStim device inserted by Dr. Davis Gourd. I did note some of her history included panic attacks etc.  The patient had an InterStim year 2010. It's close to remove midline and hurt. She thinks it has not worked for 1 year. The patient reports that her flow is currently good. She sometimes gets up once at night. In voids every 2 hours. She wears 2-3 light pads per day and denies urge incontinence.  She says she does not feel stimulation and she thinks the device is off  He says the device is sore. He said his been painful for months or yearsModifying factors:  GU: No CVA tenderness. Tender over InterStim device with no cellulitis or infection. Device close to midline on left side  Assessment & Plan: The patient is device was actually on and we turned it off. She knows how to turn it on.  I will reassess in the next 1-2 weeks in the next time in clinic. I do not want to remove the device was truly helping her. If she needs a device I will then discuss pain. If she does not need the device it will be removed. I did not give her narcotics today when she asked me how to treat the pain. I recommended when necessary Advil  PMH:      Past Medical History  Diagnosis Date  . GERD (gastroesophageal reflux disease)   . Hypertension   . Chronic back pain   . Anxiety   . Depression   . Thyroid disease   . Psoriasis   . Pneumonia     RECENT X 3  . Arthritis     Surgical History:       Past Surgical History  Procedure Laterality Date  . Bladder stimulator    . Hand surgery Right   . Oophorectomy Right     ovarian cyst  . Abdominal hysterectomy      heavy bleeding  . Cataract extraction w/phaco Right 03/07/2015    Procedure: CATARACT EXTRACTION PHACO AND INTRAOCULAR LENS PLACEMENT (IOC);  Surgeon: Estill Cotta, MD;  Location: [facility] ORS;  Service: Ophthalmology;  Laterality: Right;  [name] 01:14 AP% 26.4 CDE  33.04 fluid pack lot # [id number]  . Cataract extraction w/phaco Left 03/21/2015    Procedure: CATARACT EXTRACTION PHACO AND INTRAOCULAR LENS PLACEMENT (IOC);  Surgeon: Estill Cotta, MD;  Location: [facility] ORS;  Service: Ophthalmology;  Laterality: Left;  [name] 01:21 AP% 25.6 CDE 39.78 fluid pack lot # [id number]    Home Medications:        Medication List       This list is accurate as of: 07/13/15  4:44 PM.  Always use your most recent med list.                acetaminophen 500 MG tablet  Commonly known as:  TYLENOL  Take 500 mg by mouth every 6 (six) hours as needed. Reported on 07/01/2015     ALPRAZolam 0.5 MG tablet  Commonly known as:  XANAX  Take 1 tablet (0.5 mg total) by mouth 3 (three) times daily as needed for anxiety.     ALPRAZolam 1 MG tablet  Commonly known as:  XANAX  Take 1 tablet (1 mg total) by mouth at bedtime as needed for anxiety.     amLODipine 5 MG tablet  Commonly known as:  NORVASC  Take 1 tablet (5 mg total) by mouth daily.     citalopram 20 MG  tablet  Commonly known as:  CELEXA  Take 1 tablet (20 mg total) by mouth daily.     clobetasol ointment 0.05 %  Commonly known as:  TEMOVATE  Apply 1 application topically daily. Use twice a day for 2 weeks, then daily for 4 weeks.     COSENTYX [place]  Inject into the skin every 30 (thirty) days.     doxycycline 100 MG capsule  Commonly known as:  VIBRAMYCIN     levothyroxine 100 MCG tablet  Commonly known as:  SYNTHROID, LEVOTHROID  Take 1 tablet (100 mcg total) by mouth daily before breakfast.     lisinopril 10 MG tablet  Commonly known as:  PRINIVIL,ZESTRIL  Take 0.5 tablets (5 mg total) by mouth every morning. Take 5 mg every morning.     Vitamin D (Ergocalciferol) 50000 units Caps capsule  Commonly known as:  DRISDOL  Take 1 capsule (50,000 Units total) by mouth once a week. For 12 weeks        Allergies:       Allergies  Allergen Reactions  .  Ciprofloxacin Hives and Diarrhea  . Oxycodone Diarrhea  . Oxycodone-Acetaminophen Diarrhea  . Sulfa Antibiotics Rash    Throat, mouth    Family History:      Family History  Problem Relation Age of Onset  . Heart disease Mother   . COPD Father     Social History:  reports that she has been smoking Cigarettes.  She has been smoking about 1.00 pack per day. She does not have any smokeless tobacco history on file. She reports that she does not drink alcohol or use illicit drugs.  ROS: No shortness of breath or chest pain. Bowel movements normal. Rest of the review of systems normal    Physical Exam: BP 121/76 mmHg  Pulse 81  Ht 5' 2.5" (1.588 m)  Wt 177 lb 3.2 oz (80.377 kg)  BMI 31.87 kg/m2  No acute infection regarding implant   Laboratory Data: RecentLabs       Lab Results  Component Value Date   WBC 7.4 06/24/2015   HGB 14.8 06/22/2014   HCT 46.6 06/24/2015   MCV 93 06/24/2015   PLT 307 06/24/2015      RecentLabs       Lab Results  Component Value Date   CREATININE 0.97 06/24/2015      RecentLabs  No results found for: PSA    RecentLabs  No results found for: TESTOSTERONE    RecentLabs       Lab Results  Component Value Date   HGBA1C 6.0* 05/12/2015      Urinalysis Labs(Brief)          Component Value Date/Time   COLORURINE YELLOW* 06/22/2014 1634   COLORURINE Yellow 10/10/2011 1219   APPEARANCEUR HAZY* 06/22/2014 1634   APPEARANCEUR Cloudy 10/10/2011 1219   LABSPEC 1.020 06/22/2014 1634   LABSPEC 1.012 10/10/2011 1219   PHURINE 5.0 06/22/2014 1634   PHURINE 5.0 10/10/2011 [place] 06/22/2014 1634   GLUCOSEU Negative 10/10/2011 [place] 06/22/2014 1634   HGBUR Negative 10/10/2011 [place] 06/22/2014 1634   BILIRUBINUR Negative 10/10/2011 [place] 06/22/2014 1634   KETONESUR Negative 10/10/2011 1219   PROTEINUR 30*  06/22/2014 1634   PROTEINUR Negative 10/10/2011 1219   NITRITE NEGATIVE 06/22/2014 1634   NITRITE Positive 10/10/2011 1219   LEUKOCYTESUR 2+* 06/22/2014 1634   LEUKOCYTESUR 2+ 10/10/2011 1219  Pertinent Imaging:  Assessment & Plan:  Patient has had the device turned off [place] voiding well. I reexamined her and she still has pain over the IPG especially when she walks. I went over the pros cons and risks of removing the device and risk of retained tip. I think is a good idea for her to proceed and hopefully this will help her pain. The chance of ongoing pain was also discussed.  After a thorough review of the management options for the patient's condition the patient  elected to proceed with surgical therapy as noted above. We have discussed the potential benefits and risks of the procedure, side effects of the proposed treatment, the likelihood of the patient achieving the goals of the procedure, and any potential problems that might occur during the procedure or recuperation. Informed consent has been obtained.   Whittley Carandang A, MD

## 2015-09-07 NOTE — Anesthesia Postprocedure Evaluation (Signed)
Anesthesia Post Note  Patient: [name]  Procedure(s) Performed: Procedure(s) (LRB): REMOVAL OF INTERSTIM IMPLANT (N/A)  Patient location during evaluation: PACU Anesthesia Type: MAC Level of consciousness: awake and alert Pain management: pain level controlled Vital Signs Assessment: post-procedure vital signs reviewed and stable Respiratory status: spontaneous breathing, nonlabored ventilation, respiratory function stable and patient connected to nasal cannula oxygen Cardiovascular status: stable and blood pressure returned to baseline Anesthetic complications: no    Last Vitals:  Vitals:   09/07/15 1502 09/07/15 1604  BP: 118/66 (!) 87/48  Pulse: 97 98  Resp: 14 14  Temp: 36.3 C     Last Pain:  Vitals:   09/07/15 1604  TempSrc: Tympanic  PainSc: 0-No pain                 Shakti Fleer S

## 2015-09-07 NOTE — Op Note (Signed)
Preoperative diagnosis: Malfunctioning InterStim Postoperative diagnosis: Malfunctioning InterStim Surgery: Removal of InterStim and fluoroscopy Surgeon: Dr. Nicki Reaper Rigel Filsinger  The patient has the above diagnoses and consented above procedure. Is placed in prone position. Preoperative antibiotics were given. InterStim was located with fluoroscopy and by palpation. 10 mL of lidocaine epinephrine were used overlying the left upper back incision overlying the palpable InterStim. I dissected down through subcutaneous teens tissue and entered the pseudocapsule. Device was easily located and removed. Pseudocapsule was opened and the posterior wall.  Using fluoroscopy I marked the lead position. 5 mL of lidocaine epinephrine were utilized. I dissected down through the skin and subcutaneous tissues and easily delivered the lead.  Using lateral fluoroscopy the lead was easily removed by placing a hemostat at the level of the bone table.  Both incisions were closed with Vicryl suture. He used 3-0 Vicryl and 4-0 subcuticular for the longer incision and interrupted 4-0 Vicryl for the short incision  Sterile dressing was applied

## 2015-09-08 ENCOUNTER — Encounter: Payer: Self-pay | Admitting: Urology

## 2015-09-08 ENCOUNTER — Telehealth: Payer: Self-pay | Admitting: Family Medicine

## 2015-09-08 DIAGNOSIS — F32A Depression, unspecified: Secondary | ICD-10-CM

## 2015-09-08 DIAGNOSIS — F329 Major depressive disorder, single episode, unspecified: Secondary | ICD-10-CM

## 2015-09-08 MED ORDER — CITALOPRAM HYDROBROMIDE 20 MG PO TABS: 20.0000 mg | ORAL_TABLET | Freq: Every day | ORAL | 0 refills | Status: DC

## 2015-09-08 NOTE — Telephone Encounter (Signed)
Medication has been refilled and sent to [facility]

## 2015-09-09 LAB — SURGICAL PATHOLOGY

## 2015-09-12 ENCOUNTER — Ambulatory Visit: Payer: Self-pay

## 2015-09-15 ENCOUNTER — Encounter: Payer: Self-pay | Admitting: Family Medicine

## 2015-09-15 ENCOUNTER — Ambulatory Visit (INDEPENDENT_AMBULATORY_CARE_PROVIDER_SITE_OTHER): Payer: PPO | Admitting: Family Medicine

## 2015-09-15 VITALS — BP 132/71 | HR 107 | Temp 98.0°F | Resp 17 | Ht 63.0 in | Wt 177.0 lb

## 2015-09-15 DIAGNOSIS — I10 Essential (primary) hypertension: Secondary | ICD-10-CM

## 2015-09-15 DIAGNOSIS — F410 Panic disorder [episodic paroxysmal anxiety] without agoraphobia: Secondary | ICD-10-CM | POA: Diagnosis not present

## 2015-09-15 DIAGNOSIS — F32A Depression, unspecified: Secondary | ICD-10-CM

## 2015-09-15 DIAGNOSIS — F329 Major depressive disorder, single episode, unspecified: Secondary | ICD-10-CM | POA: Diagnosis not present

## 2015-09-15 MED ORDER — CITALOPRAM HYDROBROMIDE 20 MG PO TABS: 20.0000 mg | ORAL_TABLET | Freq: Every day | ORAL | 0 refills | Status: DC

## 2015-09-15 MED ORDER — ALPRAZOLAM 1 MG PO TABS: 1.0000 mg | ORAL_TABLET | Freq: Every evening | ORAL | 0 refills | Status: DC | PRN

## 2015-09-15 MED ORDER — ALPRAZOLAM 0.5 MG PO TABS: 0.5000 mg | ORAL_TABLET | Freq: Three times a day (TID) | ORAL | 0 refills | Status: DC | PRN

## 2015-09-15 NOTE — Progress Notes (Signed)
Name: [name]   MRN: [id number]    DOB: 11-12-1950   Date:09/15/2015       Progress Note  Subjective  Chief Complaint  Chief Complaint  Patient presents with  . Follow-up    1 mo  . Medication Refill    Alprazolam    Anxiety  Presents for follow-up visit. The problem has been unchanged. Symptoms include depressed mood, excessive worry, nervous/anxious behavior and panic. Patient reports no chest pain or suicidal ideas.   Her past medical history is significant for anxiety/panic attacks. Past treatments include benzodiazephines.  Hypertension  This is a chronic problem. The problem is unchanged. Associated symptoms include anxiety. Pertinent negatives include no blurred vision, chest pain or headaches. Past treatments include ACE inhibitors and calcium channel blockers. There is no history of kidney disease, CAD/MI or CVA.  Depression         This is a chronic problem.The problem is unchanged.  Associated symptoms include fatigue and decreased interest.  Associated symptoms include no headaches and no suicidal ideas.  Past treatments include SSRIs - Selective serotonin reuptake inhibitors.  Compliance with treatment is good.  Past medical history includes anxiety.      Past Medical History:  Diagnosis Date  . Anxiety   . Arthritis   . Chronic back pain   . Depression   . GERD (gastroesophageal reflux disease)   . Hypertension   . Hypothyroidism   . Pneumonia    RECENT X 3  . PONV (postoperative nausea and vomiting)   . Psoriasis   . Thyroid disease     Past Surgical History:  Procedure Laterality Date  . ABDOMINAL HYSTERECTOMY     heavy bleeding  . bladder stimulator    . CARPAL TUNNEL RELEASE    . CATARACT EXTRACTION W/PHACO Right 03/07/2015   Procedure: CATARACT EXTRACTION PHACO AND INTRAOCULAR LENS PLACEMENT (IOC);  Surgeon: Estill Cotta, MD;  Location: [facility];  Service: Ophthalmology;  Laterality: Right;  [name] 01:14 AP% 26.4 CDE 33.04 fluid pack lot #  [id number]  . CATARACT EXTRACTION W/PHACO Left 03/21/2015   Procedure: CATARACT EXTRACTION PHACO AND INTRAOCULAR LENS PLACEMENT (IOC);  Surgeon: Estill Cotta, MD;  Location: [facility];  Service: Ophthalmology;  Laterality: Left;  [name] 01:21 AP% 25.6 CDE 39.78 fluid pack lot # [id number]  . HAND SURGERY Right   . INTERSTIM IMPLANT REMOVAL N/A 09/07/2015   Procedure: REMOVAL OF INTERSTIM IMPLANT;  Surgeon: Bjorn Loser, MD;  Location: [facility];  Service: Urology;  Laterality: N/A;  . OOPHORECTOMY Right    ovarian cyst    Family History  Problem Relation Age of Onset  . Heart disease Mother   . COPD Father     Social History   Social History  . Marital status: Widowed    Spouse name: N/A  . Number of children: N/A  . Years of education: N/A   Occupational History  . Not on file.   Social History Main Topics  . Smoking status: Current Every Day Smoker    Packs/day: 1.00    Types: Cigarettes  . Smokeless tobacco: Never Used  . Alcohol use No  . Drug use: No  . Sexual activity: No   Other Topics Concern  . Not on file   Social History Narrative  . No narrative on file     Current Outpatient Prescriptions:  .  acetaminophen (TYLENOL) 500 MG tablet, Take 500 mg by mouth every 6 (six) hours as needed for moderate pain. Reported on  07/01/2015, Disp: , Rfl:  .  ALPRAZolam (XANAX) 0.5 MG tablet, Take 1 tablet (0.5 mg total) by mouth 3 (three) times daily as needed for anxiety., Disp: 90 tablet, Rfl: 0 .  ALPRAZolam (XANAX) 1 MG tablet, Take 1 tablet (1 mg total) by mouth at bedtime as needed for anxiety., Disp: 30 tablet, Rfl: 0 .  amLODipine (NORVASC) 5 MG tablet, Take 1 tablet (5 mg total) by mouth daily., Disp: 90 tablet, Rfl: 0 .  citalopram (CELEXA) 20 MG tablet, Take 1 tablet (20 mg total) by mouth daily., Disp: 30 tablet, Rfl: 0 .  clobetasol ointment (TEMOVATE) [id number] %, Apply 1 application topically daily. Use twice a day for 2 weeks, then daily for 4 weeks., Disp: 30 g,  Rfl: 3 .  levothyroxine (SYNTHROID, LEVOTHROID) 100 MCG tablet, Take 1 tablet (100 mcg total) by mouth daily before breakfast., Disp: 90 tablet, Rfl: 0 .  lisinopril (PRINIVIL,ZESTRIL) 10 MG tablet, Take 0.5 tablets (5 mg total) by mouth every morning. Take 5 mg every morning., Disp: 90 tablet, Rfl: 0 .  pravastatin (PRAVACHOL) 20 MG tablet, Take by mouth., Disp: , Rfl:  .  Secukinumab (COSENTYX [place]), Inject into the skin every 30 (thirty) days. 2 injections per month, Disp: , Rfl:   Allergies  Allergen Reactions  . Ciprofloxacin Hives and Diarrhea  . Oxycodone Diarrhea  . Oxycodone-Acetaminophen Diarrhea  . Oxycodone-Acetaminophen Diarrhea  . Sulfa Antibiotics Rash    Throat, mouth Throat, mouth     Review of Systems  Constitutional: Positive for fatigue.  Eyes: Negative for blurred vision.  Cardiovascular: Negative for chest pain.  Neurological: Negative for headaches.  Psychiatric/Behavioral: Positive for depression. Negative for suicidal ideas. The patient is nervous/anxious.     Objective  Vitals:   09/15/15 1324  BP: 132/71  Pulse: (!) 107  Resp: 17  Temp: 98 F (36.7 C)  TempSrc: Oral  SpO2: 93%  Weight: 177 lb (80.3 kg)  Height: 5\' 3"  (1.6 m)    Physical Exam  Constitutional: She is oriented to person, place, and time and well-developed, well-nourished, and in no distress.  Cardiovascular: Normal rate, regular rhythm, S1 normal and S2 normal.   No murmur heard. Pulmonary/Chest: Effort normal and breath sounds normal. No respiratory distress. She has no wheezes. She has no rhonchi.  Neurological: She is alert and oriented to person, place, and time.  Psychiatric: Mood, memory, affect and judgment normal. Her mood appears anxious. She does not exhibit a depressed mood. She expresses no suicidal ideation.  Nursing note and vitals reviewed.    Assessment & Plan  1. Panic disorder without agoraphobia He shouldn't has chronic high levels of anxiety, refills  provided, follow-up in one month - ALPRAZolam (XANAX) 0.5 MG tablet; Take 1 tablet (0.5 mg total) by mouth 3 (three) times daily as needed for anxiety.  Dispense: 90 tablet; Refill: 0 - ALPRAZolam (XANAX) 1 MG tablet; Take 1 tablet (1 mg total) by mouth at bedtime as needed for anxiety.  Dispense: 30 tablet; Refill: 0  2. Clinical depression Symptoms in remission on SSRI therapy - citalopram (CELEXA) 20 MG tablet; Take 1 tablet (20 mg total) by mouth daily.  Dispense: 90 tablet; Refill: 0  3. Essential hypertension Blood pressure is stable and controlled on present antihypertensive therapy   Kharee Lesesne [facility] 09/15/2015 1:35 PM

## 2015-09-20 NOTE — Telephone Encounter (Signed)
COMPLETED

## 2015-09-22 ENCOUNTER — Telehealth: Payer: Self-pay | Admitting: Urology

## 2015-09-22 ENCOUNTER — Ambulatory Visit (INDEPENDENT_AMBULATORY_CARE_PROVIDER_SITE_OTHER): Payer: PPO

## 2015-09-22 VITALS — BP 113/73 | HR 99 | Temp 98.6°F | Wt 177.6 lb

## 2015-09-22 DIAGNOSIS — N390 Urinary tract infection, site not specified: Secondary | ICD-10-CM | POA: Diagnosis not present

## 2015-09-22 LAB — MICROSCOPIC EXAMINATION
Epithelial Cells (non renal): NONE SEEN /hpf (ref 0–10)
RBC, UA: >30 /hpf — AB (ref 0–?)
WBC, UA: >30 /hpf — AB (ref 0–?)

## 2015-09-22 LAB — URINALYSIS, COMPLETE
Bilirubin, UA: NEGATIVE
GLUCOSE, UA: NEGATIVE
Ketones, UA: NEGATIVE
Nitrite, UA: NEGATIVE
PH UA: 7 (ref 5.0–7.5)
Specific Gravity, UA: 1.025 (ref 1.005–1.030)
Urobilinogen, Ur: 0.2 mg/dL (ref 0.2–1.0)

## 2015-09-22 NOTE — Telephone Encounter (Signed)
Patient had her interstim removed on 09-07-15 and would like more pain meds to get her thru till her appt with dr. Ila Mcgill on 09-30-15.  Please advise   Thanks,  michelle

## 2015-09-22 NOTE — Progress Notes (Signed)
Pt came in with c/o frequency, nocturia, dysuria and pressure. Pt denied n/v, f/c. VS- 177.6lbs, 113/73, 99, 98.6. Pt gave a clean catch urine for u/a and cx.

## 2015-09-22 NOTE — Telephone Encounter (Signed)
Unfortunately, I will not be able to refill her prescription. InterStim removal is only a small incision and should not require narcotics more than 1 week most following the procedure. She can try over-the-counter alternatives.   If this is not satisfying to her, you can contact her surgeon for further instructions.    Hollice Espy, MD

## 2015-09-22 NOTE — Telephone Encounter (Signed)
Spoke with pt in reference to pain medication post interstim removal. Pt voiced understanding. Pt then stated she feels as though she has a UTI. Pt was added to nurse schedule.

## 2015-09-23 ENCOUNTER — Telehealth: Payer: Self-pay

## 2015-09-23 DIAGNOSIS — N390 Urinary tract infection, site not specified: Secondary | ICD-10-CM

## 2015-09-23 MED ORDER — CEPHALEXIN 500 MG PO CAPS: 500.0000 mg | ORAL_CAPSULE | Freq: Three times a day (TID) | ORAL | 0 refills | Status: DC

## 2015-09-23 NOTE — Telephone Encounter (Signed)
Spoke with pt in reference to abx. Pt voiced understanding.  

## 2015-09-23 NOTE — Telephone Encounter (Signed)
-----   Message from Hollice Espy, MD sent at 09/23/2015  7:34 AM EDT ----- This does look suspicious for infection.  UCx pending.  Lets go ahead and treat with keflex 500 mg po tid x 5 days and will adjust as needed once culture returns.    Hollice Espy, MD

## 2015-09-26 LAB — CULTURE, URINE COMPREHENSIVE

## 2015-09-27 ENCOUNTER — Other Ambulatory Visit: Payer: Self-pay | Admitting: Family Medicine

## 2015-09-27 DIAGNOSIS — E039 Hypothyroidism, unspecified: Secondary | ICD-10-CM

## 2015-09-30 ENCOUNTER — Encounter: Payer: Self-pay | Admitting: Urology

## 2015-09-30 ENCOUNTER — Ambulatory Visit (INDEPENDENT_AMBULATORY_CARE_PROVIDER_SITE_OTHER): Payer: PPO | Admitting: Urology

## 2015-09-30 VITALS — BP 120/71 | HR 108 | Ht 62.0 in | Wt 177.0 lb

## 2015-09-30 DIAGNOSIS — N3946 Mixed incontinence: Secondary | ICD-10-CM

## 2015-09-30 NOTE — Progress Notes (Signed)
09/30/2015 11:34 AM   Jamal Maes May 03, 1950 [id number]  Referring provider: Roselee Nova, MD [address] [place], [place] [address]  Chief Complaint  Patient presents with  . Routine Post Op    HPI: The patient had her InterStim removed    The patient's back pain is basically gone. She currently is not having urge incontinence.  Her incisions look great     PMH: Past Medical History:  Diagnosis Date  . Anxiety   . Arthritis   . Chronic back pain   . Depression   . GERD (gastroesophageal reflux disease)   . Hypertension   . Hypothyroidism   . Pneumonia    RECENT X 3  . PONV (postoperative nausea and vomiting)   . Psoriasis   . Thyroid disease     Surgical History: Past Surgical History:  Procedure Laterality Date  . ABDOMINAL HYSTERECTOMY     heavy bleeding  . bladder stimulator    . CARPAL TUNNEL RELEASE    . CATARACT EXTRACTION W/PHACO Right 03/07/2015   Procedure: CATARACT EXTRACTION PHACO AND INTRAOCULAR LENS PLACEMENT (IOC);  Surgeon: Estill Cotta, MD;  Location: [facility];  Service: Ophthalmology;  Laterality: Right;  [name] 01:14 AP% 26.4 CDE 33.04 fluid pack lot # [id number]  . CATARACT EXTRACTION W/PHACO Left 03/21/2015   Procedure: CATARACT EXTRACTION PHACO AND INTRAOCULAR LENS PLACEMENT (IOC);  Surgeon: Estill Cotta, MD;  Location: [facility];  Service: Ophthalmology;  Laterality: Left;  [name] 01:21 AP% 25.6 CDE 39.78 fluid pack lot # [id number]  . HAND SURGERY Right   . INTERSTIM IMPLANT REMOVAL N/A 09/07/2015   Procedure: REMOVAL OF INTERSTIM IMPLANT;  Surgeon: Bjorn Loser, MD;  Location: [facility];  Service: Urology;  Laterality: N/A;  . OOPHORECTOMY Right    ovarian cyst    Home Medications:    Medication List       Accurate as of 09/30/15 11:34 AM. Always use your most recent med list.          acetaminophen 500 MG tablet Commonly known as:  TYLENOL Take 500 mg by mouth every 6 (six) hours as needed for  moderate pain. Reported on 07/01/2015   ALPRAZolam 0.5 MG tablet Commonly known as:  XANAX Take 1 tablet (0.5 mg total) by mouth 3 (three) times daily as needed for anxiety.   ALPRAZolam 1 MG tablet Commonly known as:  XANAX Take 1 tablet (1 mg total) by mouth at bedtime as needed for anxiety.   amLODipine 5 MG tablet Commonly known as:  NORVASC Take 1 tablet (5 mg total) by mouth daily.   citalopram 20 MG tablet Commonly known as:  CELEXA Take 1 tablet (20 mg total) by mouth daily.   clobetasol ointment 0.05 % Commonly known as:  TEMOVATE Apply 1 application topically daily. Use twice a day for 2 weeks, then daily for 4 weeks.   COSENTYX [place] Inject into the skin every 30 (thirty) days. 2 injections per month   lisinopril 10 MG tablet Commonly known as:  PRINIVIL,ZESTRIL Take 0.5 tablets (5 mg total) by mouth every morning. Take 5 mg every morning.   pravastatin 20 MG tablet Commonly known as:  PRAVACHOL Take by mouth.   SYNTHROID 100 MCG tablet Generic drug:  levothyroxine TAKE ONE TABLET BY MOUTH EVERY MORNING BEFORE BREAKFAST       Allergies:  Allergies  Allergen Reactions  . Ciprofloxacin Hives and Diarrhea  . Oxycodone Diarrhea  . Oxycodone-Acetaminophen Diarrhea  . Oxycodone-Acetaminophen Diarrhea  .  Sulfa Antibiotics Rash    Throat, mouth Throat, mouth    Family History: Family History  Problem Relation Age of Onset  . Heart disease Mother   . COPD Father     Social History:  reports that she has been smoking Cigarettes.  She has been smoking about 1.00 pack per day. She has never used smokeless tobacco. She reports that she does not drink alcohol or use drugs.  ROS: UROLOGY Frequent Urination?: No Hard to postpone urination?: No Burning/pain with urination?: No Get up at night to urinate?: No Leakage of urine?: No Urine stream starts and stops?: No Trouble starting stream?: No Do you have to strain to urinate?: No Blood in urine?:  No Urinary tract infection?: No Sexually transmitted disease?: No Injury to kidneys or bladder?: No Painful intercourse?: No Weak stream?: No Currently pregnant?: No Vaginal bleeding?: No Last menstrual period?: n  Gastrointestinal Nausea?: No Vomiting?: No Indigestion/heartburn?: No Diarrhea?: No Constipation?: No  Constitutional Fever: No Night sweats?: No Weight loss?: No Fatigue?: No  Skin Skin rash/lesions?: Yes Itching?: No  Eyes Blurred vision?: No Double vision?: No  Ears/Nose/Throat Sore throat?: No Sinus problems?: No  Hematologic/Lymphatic Swollen glands?: No Easy bruising?: No  Cardiovascular Leg swelling?: No Chest pain?: No  Respiratory Cough?: No Shortness of breath?: No  Endocrine Excessive thirst?: No  Musculoskeletal Back pain?: Yes Joint pain?: No  Neurological Headaches?: No Dizziness?: No  Psychologic Depression?: Yes Anxiety?: Yes  Physical Exam: BP 120/71   Pulse (!) 108   Ht 5\' 2"  (1.575 m)   Wt 177 lb (80.3 kg)   BMI 32.37 kg/m    Laboratory Data: Lab Results  Component Value Date   WBC 7.4 06/24/2015   HGB 14.8 06/22/2014   HCT 46.6 06/24/2015   MCV 93 06/24/2015   PLT 307 06/24/2015    Lab Results  Component Value Date   CREATININE 0.97 06/24/2015    No results found for: PSA  No results found for: TESTOSTERONE  Lab Results  Component Value Date   HGBA1C 6.0 (H) 05/12/2015    Urinalysis    Component Value Date/Time   COLORURINE YELLOW (A) 06/22/2014 1634   APPEARANCEUR Cloudy (A) 09/22/2015 1605   LABSPEC 1.020 06/22/2014 1634   LABSPEC 1.012 10/10/2011 1219   PHURINE 5.0 06/22/2014 1634   GLUCOSEU Negative 09/22/2015 1605   GLUCOSEU Negative 10/10/2011 1219   HGBUR NEGATIVE 06/22/2014 1634   BILIRUBINUR Negative 09/22/2015 1605   BILIRUBINUR Negative 10/10/2011 1219   KETONESUR NEGATIVE 06/22/2014 1634   PROTEINUR 3+ (A) 09/22/2015 1605   PROTEINUR 30 (A) 06/22/2014 1634   NITRITE  Negative 09/22/2015 1605   NITRITE NEGATIVE 06/22/2014 1634   LEUKOCYTESUR 2+ (A) 09/22/2015 1605   LEUKOCYTESUR 2+ 10/10/2011 1219    Pertinent Imaging: None  Assessment & Plan:  The patient has mild stable voiding dysfunction and I will see her on a when necessary basis  There are no diagnoses linked to this encounter.  No Follow-up on file.  Reece Packer, MD  [facility] [address] [place], [place] [address] [phone]

## 2015-10-01 DIAGNOSIS — L209 Atopic dermatitis, unspecified: Secondary | ICD-10-CM | POA: Diagnosis not present

## 2015-10-14 ENCOUNTER — Ambulatory Visit (INDEPENDENT_AMBULATORY_CARE_PROVIDER_SITE_OTHER): Payer: PPO | Admitting: Family Medicine

## 2015-10-14 ENCOUNTER — Encounter: Payer: Self-pay | Admitting: Family Medicine

## 2015-10-14 VITALS — BP 110/64 | HR 99 | Temp 98.0°F | Resp 16 | Ht 62.0 in | Wt 176.2 lb

## 2015-10-14 DIAGNOSIS — L409 Psoriasis, unspecified: Secondary | ICD-10-CM

## 2015-10-14 DIAGNOSIS — F410 Panic disorder [episodic paroxysmal anxiety] without agoraphobia: Secondary | ICD-10-CM

## 2015-10-14 DIAGNOSIS — I10 Essential (primary) hypertension: Secondary | ICD-10-CM

## 2015-10-14 MED ORDER — ALPRAZOLAM 0.5 MG PO TABS: 0.5000 mg | ORAL_TABLET | Freq: Three times a day (TID) | ORAL | 0 refills | Status: DC | PRN

## 2015-10-14 MED ORDER — ALPRAZOLAM 1 MG PO TABS: 1.0000 mg | ORAL_TABLET | Freq: Every evening | ORAL | 0 refills | Status: DC | PRN

## 2015-10-14 NOTE — Progress Notes (Signed)
Name: [name]   MRN: [id number]    DOB: [birth year]   Date:10/14/2015       Progress Note  Subjective  Chief Complaint  Chief Complaint  Patient presents with  . Hypertension    1 month follow up  . Rash    on both legs itchy for about 2 weeks  . Other    panic disorder without agroraphobia     Hypertension  This is a chronic problem. The problem is unchanged. Pertinent negatives include no blurred vision, chest pain, headaches or palpitations. Past treatments include calcium channel blockers and ACE inhibitors.  Rash  This is a new problem. The current episode started 1 to 4 weeks ago (3 weeks ago). The affected locations include the left upper leg, left lower leg, right upper leg and right lower leg. The rash is characterized by dryness and itchiness. She was exposed to nothing. Past treatments include anti-itch cream and topical steroids.   Was seen by urgent care for the same lesions on her right and left lower legs, and was diagnosed with psoriasis. Patient follows regularly with Dr. Phillip Heal for psoriasis treatment.  Past Medical History:  Diagnosis Date  . Anxiety   . Arthritis   . Chronic back pain   . Depression   . GERD (gastroesophageal reflux disease)   . Hypertension   . Hypothyroidism   . Pneumonia    RECENT X 3  . PONV (postoperative nausea and vomiting)   . Psoriasis   . Thyroid disease     Past Surgical History:  Procedure Laterality Date  . ABDOMINAL HYSTERECTOMY     heavy bleeding  . bladder stimulator    . CARPAL TUNNEL RELEASE    . CATARACT EXTRACTION W/PHACO Right 03/07/2015   Procedure: CATARACT EXTRACTION PHACO AND INTRAOCULAR LENS PLACEMENT (IOC);  Surgeon: Estill Cotta, MD;  Location: [facility];  Service: Ophthalmology;  Laterality: Right;  [name] 01:14 AP% 26.4 CDE 33.04 fluid pack lot # [id number]  . CATARACT EXTRACTION W/PHACO Left 03/21/2015   Procedure: CATARACT EXTRACTION PHACO AND INTRAOCULAR LENS PLACEMENT (IOC);  Surgeon: Estill Cotta, MD;  Location: [facility];  Service: Ophthalmology;  Laterality: Left;  [name] 01:21 AP% 25.6 CDE 39.78 fluid pack lot # [id number]  . HAND SURGERY Right   . INTERSTIM IMPLANT REMOVAL N/A 09/07/2015   Procedure: REMOVAL OF INTERSTIM IMPLANT;  Surgeon: Bjorn Loser, MD;  Location: [facility];  Service: Urology;  Laterality: N/A;  . OOPHORECTOMY Right    ovarian cyst    Family History  Problem Relation Age of Onset  . Heart disease Mother   . COPD Father     Social History   Social History  . Marital status: Widowed    Spouse name: N/A  . Number of children: N/A  . Years of education: N/A   Occupational History  . Not on file.   Social History Main Topics  . Smoking status: Current Every Day Smoker    Packs/day: 1.00    Types: Cigarettes  . Smokeless tobacco: Never Used  . Alcohol use No  . Drug use: No  . Sexual activity: No   Other Topics Concern  . Not on file   Social History Narrative  . No narrative on file     Current Outpatient Prescriptions:  .  acetaminophen (TYLENOL) 500 MG tablet, Take 500 mg by mouth every 6 (six) hours as needed for moderate pain. Reported on 07/01/2015, Disp: , Rfl:  .  ALPRAZolam (XANAX) 0.5  MG tablet, Take 1 tablet (0.5 mg total) by mouth 3 (three) times daily as needed for anxiety., Disp: 90 tablet, Rfl: 0 .  ALPRAZolam (XANAX) 1 MG tablet, Take 1 tablet (1 mg total) by mouth at bedtime as needed for anxiety., Disp: 30 tablet, Rfl: 0 .  amLODipine (NORVASC) 5 MG tablet, Take 1 tablet (5 mg total) by mouth daily., Disp: 90 tablet, Rfl: 0 .  citalopram (CELEXA) 20 MG tablet, Take 1 tablet (20 mg total) by mouth daily., Disp: 90 tablet, Rfl: 0 .  clobetasol ointment (TEMOVATE) [id number] %, Apply 1 application topically daily. Use twice a day for 2 weeks, then daily for 4 weeks., Disp: 30 g, Rfl: 3 .  lisinopril (PRINIVIL,ZESTRIL) 10 MG tablet, Take 0.5 tablets (5 mg total) by mouth every morning. Take 5 mg every morning., Disp: 90  tablet, Rfl: 0 .  pravastatin (PRAVACHOL) 20 MG tablet, Take by mouth., Disp: , Rfl:  .  Secukinumab (COSENTYX [place]), Inject into the skin every 30 (thirty) days. 2 injections per month, Disp: , Rfl:  .  SYNTHROID 100 MCG tablet, TAKE ONE TABLET BY MOUTH EVERY MORNING BEFORE BREAKFAST, Disp: 90 tablet, Rfl: 1  Allergies  Allergen Reactions  . Ciprofloxacin Hives and Diarrhea  . Oxycodone Diarrhea  . Oxycodone-Acetaminophen Diarrhea  . Oxycodone-Acetaminophen Diarrhea  . Sulfa Antibiotics Rash    Throat, mouth Throat, mouth     Review of Systems  Eyes: Negative for blurred vision.  Cardiovascular: Negative for chest pain and palpitations.  Skin: Positive for rash.  Neurological: Negative for headaches.    Objective  Vitals:   10/14/15 1106  BP: 110/64  Pulse: 99  Resp: 16  Temp: 98 F (36.7 C)  SpO2: 95%  Weight: 176 lb 3 oz (79.9 kg)  Height: 5\' 2"  (1.575 m)    Physical Exam  Constitutional: She is well-developed, well-nourished, and in no distress.  Cardiovascular: Normal rate, regular rhythm, S1 normal, S2 normal and normal heart sounds.   No murmur heard. Pulmonary/Chest: Effort normal and breath sounds normal. She has no wheezes.  Skin: Skin is warm. Lesion and rash noted. Rash is papular.     Scattered scaly papular pruritic erythematous lesions on the lower extremities,   Psychiatric: Memory and judgment normal. Her mood appears anxious. She has a flat affect.  Nursing note and vitals reviewed.     Assessment & Plan  1. Panic disorder without agoraphobia Symptoms stable and responsive to alprazolam taken 3 times daily as needed and 1 mg at bedtime, compliant with controlled substances agreement. Refills provided - ALPRAZolam (XANAX) 0.5 MG tablet; Take 1 tablet (0.5 mg total) by mouth 3 (three) times daily as needed for anxiety.  Dispense: 90 tablet; Refill: 0 - ALPRAZolam (XANAX) 1 MG tablet; Take 1 tablet (1 mg total) by mouth at bedtime as needed for  anxiety.  Dispense: 30 tablet; Refill: 0  2. Psoriasis Patient is to follow-up with dermatology, lesions are consistent with psoriasis  3. Benign essential HTN Stable and controlled on present antihypertensive therapy   Duaa Stelzner [facility]ad [facility] 10/14/2015 11:15 AM

## 2015-11-02 DIAGNOSIS — L400 Psoriasis vulgaris: Secondary | ICD-10-CM | POA: Diagnosis not present

## 2015-11-14 ENCOUNTER — Ambulatory Visit (INDEPENDENT_AMBULATORY_CARE_PROVIDER_SITE_OTHER): Payer: PPO | Admitting: Family Medicine

## 2015-11-14 ENCOUNTER — Encounter: Payer: Self-pay | Admitting: Family Medicine

## 2015-11-14 VITALS — BP 115/66 | HR 111 | Temp 98.0°F | Resp 16 | Ht 62.0 in | Wt 176.7 lb

## 2015-11-14 DIAGNOSIS — Z23 Encounter for immunization: Secondary | ICD-10-CM | POA: Diagnosis not present

## 2015-11-14 DIAGNOSIS — F410 Panic disorder [episodic paroxysmal anxiety] without agoraphobia: Secondary | ICD-10-CM

## 2015-11-14 DIAGNOSIS — I10 Essential (primary) hypertension: Secondary | ICD-10-CM

## 2015-11-14 MED ORDER — ALPRAZOLAM 1 MG PO TABS: 1.0000 mg | ORAL_TABLET | Freq: Every evening | ORAL | 0 refills | Status: DC | PRN

## 2015-11-14 MED ORDER — AMLODIPINE BESYLATE 5 MG PO TABS: 5.0000 mg | ORAL_TABLET | Freq: Every day | ORAL | 0 refills | Status: DC

## 2015-11-14 MED ORDER — ALPRAZOLAM 0.5 MG PO TABS: 0.5000 mg | ORAL_TABLET | Freq: Three times a day (TID) | ORAL | 0 refills | Status: DC | PRN

## 2015-11-14 NOTE — Progress Notes (Signed)
Name: [name]   MRN: [id number]    DOB: 1950/04/20   Date:11/14/2015       Progress Note  Subjective  Chief Complaint  Chief Complaint  Patient presents with  . Follow-up    1 mo  . Medication Refill    xanax / amlodipine    Anxiety  Presents for follow-up visit. Symptoms include depressed mood, excessive worry, insomnia, nervous/anxious behavior, panic and restlessness. Patient reports no chest pain, palpitations or shortness of breath. The severity of symptoms is moderate.    Hypertension  This is a chronic problem. The problem is unchanged. Associated symptoms include anxiety. Pertinent negatives include no blurred vision, chest pain, headaches, palpitations or shortness of breath. Past treatments include calcium channel blockers and ACE inhibitors. There is no history of CAD/MI or CVA.     Past Medical History:  Diagnosis Date  . Anxiety   . Arthritis   . Chronic back pain   . Depression   . GERD (gastroesophageal reflux disease)   . Hypertension   . Hypothyroidism   . Pneumonia    RECENT X 3  . PONV (postoperative nausea and vomiting)   . Psoriasis   . Thyroid disease     Past Surgical History:  Procedure Laterality Date  . ABDOMINAL HYSTERECTOMY     heavy bleeding  . bladder stimulator    . CARPAL TUNNEL RELEASE    . CATARACT EXTRACTION W/PHACO Right 03/07/2015   Procedure: CATARACT EXTRACTION PHACO AND INTRAOCULAR LENS PLACEMENT (IOC);  Surgeon: Estill Cotta, MD;  Location: [facility];  Service: Ophthalmology;  Laterality: Right;  [name] 01:14 AP% 26.4 CDE 33.04 fluid pack lot # [id number]  . CATARACT EXTRACTION W/PHACO Left 03/21/2015   Procedure: CATARACT EXTRACTION PHACO AND INTRAOCULAR LENS PLACEMENT (IOC);  Surgeon: Estill Cotta, MD;  Location: [facility];  Service: Ophthalmology;  Laterality: Left;  [name] 01:21 AP% 25.6 CDE 39.78 fluid pack lot # [id number]  . HAND SURGERY Right   . INTERSTIM IMPLANT REMOVAL N/A 09/07/2015   Procedure: REMOVAL OF  INTERSTIM IMPLANT;  Surgeon: Bjorn Loser, MD;  Location: [facility];  Service: Urology;  Laterality: N/A;  . OOPHORECTOMY Right    ovarian cyst    Family History  Problem Relation Age of Onset  . Heart disease Mother   . COPD Father     Social History   Social History  . Marital status: Widowed    Spouse name: N/A  . Number of children: N/A  . Years of education: N/A   Occupational History  . Not on file.   Social History Main Topics  . Smoking status: Current Every Day Smoker    Packs/day: 1.00    Types: Cigarettes  . Smokeless tobacco: Never Used  . Alcohol use No  . Drug use: No  . Sexual activity: No   Other Topics Concern  . Not on file   Social History Narrative  . No narrative on file     Current Outpatient Prescriptions:  .  acetaminophen (TYLENOL) 500 MG tablet, Take 500 mg by mouth every 6 (six) hours as needed for moderate pain. Reported on 07/01/2015, Disp: , Rfl:  .  ALPRAZolam (XANAX) 0.5 MG tablet, Take 1 tablet (0.5 mg total) by mouth 3 (three) times daily as needed for anxiety., Disp: 90 tablet, Rfl: 0 .  ALPRAZolam (XANAX) 1 MG tablet, Take 1 tablet (1 mg total) by mouth at bedtime as needed for anxiety., Disp: 30 tablet, Rfl: 0 .  amLODipine (NORVASC)  5 MG tablet, Take 1 tablet (5 mg total) by mouth daily., Disp: 90 tablet, Rfl: 0 .  citalopram (CELEXA) 20 MG tablet, Take 1 tablet (20 mg total) by mouth daily., Disp: 90 tablet, Rfl: 0 .  clobetasol ointment (TEMOVATE) AB-123456789 %, Apply 1 application topically daily. Use twice a day for 2 weeks, then daily for 4 weeks., Disp: 30 g, Rfl: 3 .  lisinopril (PRINIVIL,ZESTRIL) 10 MG tablet, Take 0.5 tablets (5 mg total) by mouth every morning. Take 5 mg every morning., Disp: 90 tablet, Rfl: 0 .  pravastatin (PRAVACHOL) 20 MG tablet, Take by mouth., Disp: , Rfl:  .  Secukinumab (COSENTYX [place]), Inject into the skin every 30 (thirty) days. 2 injections per month, Disp: , Rfl:  .  SYNTHROID 100 MCG tablet, TAKE  ONE TABLET BY MOUTH EVERY MORNING BEFORE BREAKFAST, Disp: 90 tablet, Rfl: 1  Allergies  Allergen Reactions  . Ciprofloxacin Hives and Diarrhea  . Oxycodone Diarrhea  . Oxycodone-Acetaminophen Diarrhea  . Oxycodone-Acetaminophen Diarrhea  . Sulfa Antibiotics Rash    Throat, mouth Throat, mouth     Review of Systems  Eyes: Negative for blurred vision.  Respiratory: Negative for shortness of breath.   Cardiovascular: Negative for chest pain and palpitations.  Neurological: Negative for headaches.  Psychiatric/Behavioral: Positive for depression. The patient is nervous/anxious and has insomnia.     Objective  Vitals:   11/14/15 0933  BP: 115/66  Pulse: (!) 111  Resp: 16  Temp: 98 F (36.7 C)  TempSrc: Oral  SpO2: 94%  Weight: 176 lb 11.2 oz (80.2 kg)  Height: 5\' 2"  (1.575 m)    Physical Exam  Constitutional: She is well-developed, well-nourished, and in no distress.  Cardiovascular: Normal rate, regular rhythm, S1 normal, S2 normal and normal heart sounds.   No murmur heard. Pulmonary/Chest: Effort normal and breath sounds normal. She has no wheezes.  Skin: Skin is warm.  Psychiatric: Memory, affect and judgment normal. Her mood appears anxious.  Nursing note and vitals reviewed.     Assessment & Plan  1. Benign hypertension BP stable on present antihypertensive therapy - amLODipine (NORVASC) 5 MG tablet; Take 1 tablet (5 mg total) by mouth daily.  Dispense: 90 tablet; Refill: 0  2. Panic disorder without agoraphobia Continue on alprazolam 1 mg at bedtime for insomnia, 0.5 mg 3 times daily for  anxiety - ALPRAZolam (XANAX) 1 MG tablet; Take 1 tablet (1 mg total) by mouth at bedtime as needed for anxiety.  Dispense: 30 tablet; Refill: 0 - ALPRAZolam (XANAX) 0.5 MG tablet; Take 1 tablet (0.5 mg total) by mouth 3 (three) times daily as needed for anxiety.  Dispense: 90 tablet; Refill: 0  3. Need for immunization against influenza  - Flu vaccine HIGH DOSE PF  (Fluzone High dose)   Breeley Bischof [facility] 11/14/2015 10:07 AM

## 2015-11-30 ENCOUNTER — Other Ambulatory Visit: Payer: Self-pay | Admitting: Family Medicine

## 2015-11-30 DIAGNOSIS — Z1231 Encounter for screening mammogram for malignant neoplasm of breast: Secondary | ICD-10-CM

## 2015-12-13 ENCOUNTER — Ambulatory Visit (INDEPENDENT_AMBULATORY_CARE_PROVIDER_SITE_OTHER): Payer: PPO | Admitting: Family Medicine

## 2015-12-13 ENCOUNTER — Encounter: Payer: Self-pay | Admitting: Family Medicine

## 2015-12-13 VITALS — BP 118/78 | HR 108 | Temp 98.0°F | Resp 18 | Ht 62.0 in | Wt 177.4 lb

## 2015-12-13 DIAGNOSIS — R1013 Epigastric pain: Secondary | ICD-10-CM

## 2015-12-13 DIAGNOSIS — F410 Panic disorder [episodic paroxysmal anxiety] without agoraphobia: Secondary | ICD-10-CM | POA: Diagnosis not present

## 2015-12-13 LAB — CBC WITH DIFFERENTIAL/PLATELET
BASOS PCT: 1 %
Basophils Absolute: 78 cells/uL (ref 0–200)
EOS ABS: 312 {cells}/uL (ref 15–500)
Eosinophils Relative: 4 %
HEMATOCRIT: 45.3 % — AB (ref 35.0–45.0)
HEMOGLOBIN: 14.7 g/dL (ref 11.7–15.5)
LYMPHS ABS: 2028 {cells}/uL (ref 850–3900)
Lymphocytes Relative: 26 %
MCH: 30.4 pg (ref 27.0–33.0)
MCHC: 32.5 g/dL (ref 32.0–36.0)
MCV: 93.8 fL (ref 80.0–100.0)
MONO ABS: 936 {cells}/uL (ref 200–950)
MPV: 10.3 fL (ref 7.5–12.5)
Monocytes Relative: 12 %
Neutro Abs: 4446 cells/uL (ref 1500–7800)
Neutrophils Relative %: 57 %
Platelets: 240 10*3/uL (ref 140–400)
RBC: 4.83 MIL/uL (ref 3.80–5.10)
RDW: 13.3 % (ref 11.0–15.0)
WBC: 7.8 10*3/uL (ref 3.8–10.8)

## 2015-12-13 MED ORDER — OMEPRAZOLE 20 MG PO CPDR: 20.0000 mg | DELAYED_RELEASE_CAPSULE | Freq: Every day | ORAL | 0 refills | Status: DC

## 2015-12-13 MED ORDER — ALPRAZOLAM 0.5 MG PO TABS: 0.5000 mg | ORAL_TABLET | Freq: Three times a day (TID) | ORAL | 0 refills | Status: DC | PRN

## 2015-12-13 MED ORDER — ALPRAZOLAM 1 MG PO TABS: 1.0000 mg | ORAL_TABLET | Freq: Every evening | ORAL | 0 refills | Status: DC | PRN

## 2015-12-13 NOTE — Progress Notes (Signed)
Name: [name]   MRN: [id number]    DOB: Jan 11, 1951   Date:12/13/2015       Progress Note  Subjective  Chief Complaint  Chief Complaint  Patient presents with  . Follow-up    1 mo  . Medication Refill    Xanax     Abdominal Pain  This is a new problem. Episode onset: 10 days ago. The pain is located in the epigastric region. The pain is at a severity of 5/10. The quality of the pain is burning. The abdominal pain does not radiate. Pertinent negatives include no anorexia, fever, melena, nausea, vomiting or weight loss. The pain is aggravated by eating (worse with eating or drinking, feels like a burning sensation when food/drink is going down through the esophagus). She has tried antacids (TUMS) for the symptoms.  Anxiety  Presents for follow-up visit. Symptoms include depressed mood, excessive worry, insomnia, irritability, nervous/anxious behavior and panic. Patient reports no nausea, shortness of breath or suicidal ideas. The quality of sleep is good.      Past Medical History:  Diagnosis Date  . Anxiety   . Arthritis   . Chronic back pain   . Depression   . GERD (gastroesophageal reflux disease)   . Hypertension   . Hypothyroidism   . Pneumonia    RECENT X 3  . PONV (postoperative nausea and vomiting)   . Psoriasis   . Thyroid disease     Past Surgical History:  Procedure Laterality Date  . ABDOMINAL HYSTERECTOMY     heavy bleeding  . bladder stimulator    . CARPAL TUNNEL RELEASE    . CATARACT EXTRACTION W/PHACO Right 03/07/2015   Procedure: CATARACT EXTRACTION PHACO AND INTRAOCULAR LENS PLACEMENT (IOC);  Surgeon: Estill Cotta, MD;  Location: [facility];  Service: Ophthalmology;  Laterality: Right;  [name] 01:14 AP% 26.4 CDE 33.04 fluid pack lot # [id number]  . CATARACT EXTRACTION W/PHACO Left 03/21/2015   Procedure: CATARACT EXTRACTION PHACO AND INTRAOCULAR LENS PLACEMENT (IOC);  Surgeon: Estill Cotta, MD;  Location: [facility];  Service: Ophthalmology;   Laterality: Left;  [name] 01:21 AP% 25.6 CDE 39.78 fluid pack lot # [id number]  . HAND SURGERY Right   . INTERSTIM IMPLANT REMOVAL N/A 09/07/2015   Procedure: REMOVAL OF INTERSTIM IMPLANT;  Surgeon: Bjorn Loser, MD;  Location: [facility];  Service: Urology;  Laterality: N/A;  . OOPHORECTOMY Right    ovarian cyst    Family History  Problem Relation Age of Onset  . Heart disease Mother   . COPD Father     Social History   Social History  . Marital status: Widowed    Spouse name: N/A  . Number of children: N/A  . Years of education: N/A   Occupational History  . Not on file.   Social History Main Topics  . Smoking status: Current Every Day Smoker    Packs/day: 1.00    Types: Cigarettes  . Smokeless tobacco: Never Used  . Alcohol use No  . Drug use: No  . Sexual activity: No   Other Topics Concern  . Not on file   Social History Narrative  . No narrative on file     Current Outpatient Prescriptions:  .  acetaminophen (TYLENOL) 500 MG tablet, Take 500 mg by mouth every 6 (six) hours as needed for moderate pain. Reported on 07/01/2015, Disp: , Rfl:  .  ALPRAZolam (XANAX) 0.5 MG tablet, Take 1 tablet (0.5 mg total) by mouth 3 (three) times daily as  needed for anxiety., Disp: 90 tablet, Rfl: 0 .  ALPRAZolam (XANAX) 1 MG tablet, Take 1 tablet (1 mg total) by mouth at bedtime as needed for anxiety., Disp: 30 tablet, Rfl: 0 .  amLODipine (NORVASC) 5 MG tablet, Take 1 tablet (5 mg total) by mouth daily., Disp: 90 tablet, Rfl: 0 .  citalopram (CELEXA) 20 MG tablet, Take 1 tablet (20 mg total) by mouth daily., Disp: 90 tablet, Rfl: 0 .  clobetasol ointment (TEMOVATE) AB-123456789 %, Apply 1 application topically daily. Use twice a day for 2 weeks, then daily for 4 weeks., Disp: 30 g, Rfl: 3 .  lisinopril (PRINIVIL,ZESTRIL) 10 MG tablet, Take 0.5 tablets (5 mg total) by mouth every morning. Take 5 mg every morning., Disp: 90 tablet, Rfl: 0 .  pravastatin (PRAVACHOL) 20 MG tablet, Take by  mouth., Disp: , Rfl:  .  Secukinumab (COSENTYX [place]), Inject into the skin every 30 (thirty) days. 2 injections per month, Disp: , Rfl:  .  SYNTHROID 100 MCG tablet, TAKE ONE TABLET BY MOUTH EVERY MORNING BEFORE BREAKFAST, Disp: 90 tablet, Rfl: 1  Allergies  Allergen Reactions  . Ciprofloxacin Hives and Diarrhea  . Oxycodone Diarrhea  . Oxycodone-Acetaminophen Diarrhea  . Oxycodone-Acetaminophen Diarrhea  . Sulfa Antibiotics Rash    Throat, mouth Throat, mouth     Review of Systems  Constitutional: Positive for irritability. Negative for fever and weight loss.  Respiratory: Negative for shortness of breath.   Gastrointestinal: Positive for abdominal pain. Negative for anorexia, melena, nausea and vomiting.  Psychiatric/Behavioral: Negative for suicidal ideas. The patient is nervous/anxious and has insomnia.      Objective  Vitals:   12/13/15 0940  BP: 118/78  Pulse: (!) 108  Resp: 18  Temp: 98 F (36.7 C)  TempSrc: Oral  SpO2: 93%  Weight: 177 lb 6.4 oz (80.5 kg)  Height: 5\' 2"  (1.575 m)    Physical Exam  Constitutional: She is well-developed, well-nourished, and in no distress.  HENT:  Head: Normocephalic and atraumatic.  Cardiovascular: Normal rate, regular rhythm, S1 normal, S2 normal and normal heart sounds.   No murmur heard. Pulmonary/Chest: Effort normal. No respiratory distress. She has wheezes in the right lower field and the left lower field. She has no rhonchi. She has no rales.  Abdominal: Soft. Bowel sounds are normal. There is tenderness in the epigastric area and left upper quadrant. There is no rebound.  Psychiatric: Memory, affect and judgment normal. Her mood appears anxious. She exhibits a depressed mood.  Nursing note and vitals reviewed.    Assessment & Plan  1. Abdominal pain, acute, epigastric Pain baseline lab work including Helicobacter pylori stool antigen, patient has history of GERD and will start on omeprazole for relief. We will also  recommend referral to gastroenterology for endoscopy. - CBC with Differential - COMPLETE METABOLIC PANEL WITH GFR - Ambulatory referral to Gastroenterology - omeprazole (PRILOSEC) 20 MG capsule; Take 1 capsule (20 mg total) by mouth daily.  Dispense: 30 capsule; Refill: 0 - Helicobacter pylori antigen det, stool  2. Panic disorder without agoraphobia Stable, responsive to alprazolam taken 3 times daily as needed and 1 mg at bedtime. Patient compliant with controlled substances agreement. Refills provided and follow-up in one month - ALPRAZolam (XANAX) 1 MG tablet; Take 1 tablet (1 mg total) by mouth at bedtime as needed for anxiety.  Dispense: 30 tablet; Refill: 0 - ALPRAZolam (XANAX) 0.5 MG tablet; Take 1 tablet (0.5 mg total) by mouth 3 (three) times daily as needed  for anxiety.  Dispense: 90 tablet; Refill: 0   Shekita Boyden [facility] 12/13/2015 10:02 AM

## 2015-12-14 ENCOUNTER — Other Ambulatory Visit: Payer: Self-pay | Admitting: Family Medicine

## 2015-12-14 DIAGNOSIS — R1013 Epigastric pain: Secondary | ICD-10-CM | POA: Diagnosis not present

## 2015-12-14 LAB — COMPLETE METABOLIC PANEL WITH GFR
ALBUMIN: 3.9 g/dL (ref 3.6–5.1)
ALK PHOS: 77 U/L (ref 33–130)
ALT: 13 U/L (ref 6–29)
AST: 20 U/L (ref 10–35)
BILIRUBIN TOTAL: 0.4 mg/dL (ref 0.2–1.2)
BUN: 9 mg/dL (ref 7–25)
CALCIUM: 9.5 mg/dL (ref 8.6–10.4)
CHLORIDE: 103 mmol/L (ref 98–110)
CO2: 32 mmol/L — ABNORMAL HIGH (ref 20–31)
CREATININE: 0.85 mg/dL (ref 0.50–0.99)
GFR, EST AFRICAN AMERICAN: 83 mL/min (ref >=60)
GFR, Est Non African American: 72 mL/min (ref >=60)
Glucose, Bld: 110 mg/dL — ABNORMAL HIGH (ref 65–99)
Potassium: 4.5 mmol/L (ref 3.5–5.3)
Sodium: 142 mmol/L (ref 135–146)
TOTAL PROTEIN: 6.4 g/dL (ref 6.1–8.1)

## 2015-12-15 ENCOUNTER — Ambulatory Visit: Payer: Self-pay | Admitting: Family Medicine

## 2015-12-15 LAB — HELICOBACTER PYLORI  SPECIAL ANTIGEN: H. PYLORI ANTIGEN STOOL: NOT DETECTED

## 2015-12-17 ENCOUNTER — Encounter: Payer: Self-pay | Admitting: Emergency Medicine

## 2015-12-17 ENCOUNTER — Emergency Department: Payer: PPO

## 2015-12-17 ENCOUNTER — Inpatient Hospital Stay
Admission: EM | Admit: 2015-12-17 | Discharge: 2015-12-20 | DRG: 190 | Disposition: A | Discharge status: Skilled Nursing Facility | Payer: PPO | Attending: Internal Medicine | Admitting: Internal Medicine

## 2015-12-17 DIAGNOSIS — Z882 Allergy status to sulfonamides status: Secondary | ICD-10-CM

## 2015-12-17 DIAGNOSIS — R000 Tachycardia, unspecified: Secondary | ICD-10-CM | POA: Diagnosis not present

## 2015-12-17 DIAGNOSIS — J441 Chronic obstructive pulmonary disease with (acute) exacerbation: Secondary | ICD-10-CM | POA: Diagnosis not present

## 2015-12-17 DIAGNOSIS — R05 Cough: Secondary | ICD-10-CM | POA: Diagnosis not present

## 2015-12-17 DIAGNOSIS — Z888 Allergy status to other drugs, medicaments and biological substances status: Secondary | ICD-10-CM | POA: Diagnosis not present

## 2015-12-17 DIAGNOSIS — Z8701 Personal history of pneumonia (recurrent): Secondary | ICD-10-CM

## 2015-12-17 DIAGNOSIS — Z79899 Other long term (current) drug therapy: Secondary | ICD-10-CM

## 2015-12-17 DIAGNOSIS — J189 Pneumonia, unspecified organism: Secondary | ICD-10-CM

## 2015-12-17 DIAGNOSIS — Z8249 Family history of ischemic heart disease and other diseases of the circulatory system: Secondary | ICD-10-CM

## 2015-12-17 DIAGNOSIS — J440 Chronic obstructive pulmonary disease with acute lower respiratory infection: Secondary | ICD-10-CM | POA: Diagnosis not present

## 2015-12-17 DIAGNOSIS — I10 Essential (primary) hypertension: Secondary | ICD-10-CM | POA: Diagnosis not present

## 2015-12-17 DIAGNOSIS — J9601 Acute respiratory failure with hypoxia: Secondary | ICD-10-CM | POA: Diagnosis not present

## 2015-12-17 DIAGNOSIS — R079 Chest pain, unspecified: Secondary | ICD-10-CM | POA: Diagnosis not present

## 2015-12-17 DIAGNOSIS — Z961 Presence of intraocular lens: Secondary | ICD-10-CM | POA: Diagnosis not present

## 2015-12-17 DIAGNOSIS — Z9841 Cataract extraction status, right eye: Secondary | ICD-10-CM | POA: Diagnosis not present

## 2015-12-17 DIAGNOSIS — F329 Major depressive disorder, single episode, unspecified: Secondary | ICD-10-CM | POA: Diagnosis present

## 2015-12-17 DIAGNOSIS — F17210 Nicotine dependence, cigarettes, uncomplicated: Secondary | ICD-10-CM | POA: Diagnosis present

## 2015-12-17 DIAGNOSIS — E039 Hypothyroidism, unspecified: Secondary | ICD-10-CM | POA: Diagnosis not present

## 2015-12-17 DIAGNOSIS — R0602 Shortness of breath: Secondary | ICD-10-CM | POA: Diagnosis not present

## 2015-12-17 DIAGNOSIS — R262 Difficulty in walking, not elsewhere classified: Secondary | ICD-10-CM

## 2015-12-17 DIAGNOSIS — F410 Panic disorder [episodic paroxysmal anxiety] without agoraphobia: Secondary | ICD-10-CM

## 2015-12-17 DIAGNOSIS — J449 Chronic obstructive pulmonary disease, unspecified: Secondary | ICD-10-CM | POA: Diagnosis present

## 2015-12-17 LAB — CBC
HCT: 46.2 % (ref 35.0–47.0)
Hemoglobin: 15.8 g/dL (ref 12.0–16.0)
MCH: 30.8 pg (ref 26.0–34.0)
MCHC: 34.1 g/dL (ref 32.0–36.0)
MCV: 90.5 fL (ref 80.0–100.0)
PLATELETS: 225 10*3/uL (ref 150–440)
RBC: 5.11 MIL/uL (ref 3.80–5.20)
RDW: 13 % (ref 11.5–14.5)
WBC: 6.8 10*3/uL (ref 3.6–11.0)

## 2015-12-17 LAB — BASIC METABOLIC PANEL
Anion gap: 7 (ref 5–15)
BUN: 9 mg/dL (ref 6–20)
CALCIUM: 9 mg/dL (ref 8.9–10.3)
CO2: 30 mmol/L (ref 22–32)
CREATININE: 0.83 mg/dL (ref 0.44–1.00)
Chloride: 99 mmol/L — ABNORMAL LOW (ref 101–111)
GFR calc Af Amer: >60 mL/min (ref >60)
GLUCOSE: 171 mg/dL — AB (ref 65–99)
POTASSIUM: 3.6 mmol/L (ref 3.5–5.1)
SODIUM: 136 mmol/L (ref 135–145)

## 2015-12-17 LAB — TROPONIN I

## 2015-12-17 MED ORDER — METHYLPREDNISOLONE SODIUM SUCC 125 MG IJ SOLR
60.0000 mg | Freq: Two times a day (BID) | INTRAMUSCULAR | Status: DC
  Administered 2015-12-17: 60 mg via INTRAVENOUS
  Filled 2015-12-17: qty 2

## 2015-12-17 MED ORDER — LISINOPRIL 5 MG PO TABS
5.0000 mg | ORAL_TABLET | Freq: Every morning | ORAL | Status: DC
  Administered 2015-12-18 – 2015-12-20 (×3): 5 mg via ORAL
  Filled 2015-12-17 (×3): qty 1

## 2015-12-17 MED ORDER — DOCUSATE SODIUM 100 MG PO CAPS
100.0000 mg | ORAL_CAPSULE | Freq: Two times a day (BID) | ORAL | Status: DC
  Administered 2015-12-17 – 2015-12-20 (×6): 100 mg via ORAL
  Filled 2015-12-17 (×6): qty 1

## 2015-12-17 MED ORDER — AZITHROMYCIN 250 MG PO TABS: ORAL_TABLET | ORAL | 0 refills | Status: DC

## 2015-12-17 MED ORDER — ALPRAZOLAM 0.5 MG PO TABS
1.0000 mg | ORAL_TABLET | Freq: Every evening | ORAL | Status: DC | PRN
  Administered 2015-12-17 – 2015-12-19 (×3): 1 mg via ORAL
  Filled 2015-12-17 (×3): qty 2

## 2015-12-17 MED ORDER — CITALOPRAM HYDROBROMIDE 20 MG PO TABS
20.0000 mg | ORAL_TABLET | Freq: Every day | ORAL | Status: DC
Start: 2015-12-18 — End: 2015-12-20
  Administered 2015-12-18 – 2015-12-20 (×3): 20 mg via ORAL
  Filled 2015-12-17 (×3): qty 1

## 2015-12-17 MED ORDER — ONDANSETRON HCL 4 MG PO TABS: 4.0000 mg | ORAL_TABLET | Freq: Four times a day (QID) | ORAL | Status: DC | PRN

## 2015-12-17 MED ORDER — LEVALBUTEROL HCL 1.25 MG/0.5ML IN NEBU
1.2500 mg | INHALATION_SOLUTION | Freq: Four times a day (QID) | RESPIRATORY_TRACT | Status: DC
  Administered 2015-12-17: 1.25 mg via RESPIRATORY_TRACT
  Filled 2015-12-17: qty 0.5

## 2015-12-17 MED ORDER — BISACODYL 5 MG PO TBEC: 5.0000 mg | DELAYED_RELEASE_TABLET | Freq: Every day | ORAL | Status: DC | PRN

## 2015-12-17 MED ORDER — DILTIAZEM HCL 60 MG PO TABS
60.0000 mg | ORAL_TABLET | Freq: Three times a day (TID) | ORAL | Status: DC
  Administered 2015-12-17 – 2015-12-18 (×3): 60 mg via ORAL
  Filled 2015-12-17 (×3): qty 1

## 2015-12-17 MED ORDER — ALBUTEROL SULFATE HFA 108 (90 BASE) MCG/ACT IN AERS: 2.0000 | INHALATION_SPRAY | Freq: Four times a day (QID) | RESPIRATORY_TRACT | 2 refills | Status: DC | PRN

## 2015-12-17 MED ORDER — LEVALBUTEROL HCL 1.25 MG/0.5ML IN NEBU
1.2500 mg | INHALATION_SOLUTION | Freq: Four times a day (QID) | RESPIRATORY_TRACT | Status: DC
  Administered 2015-12-18 – 2015-12-20 (×11): 1.25 mg via RESPIRATORY_TRACT
  Filled 2015-12-17 (×12): qty 0.5

## 2015-12-17 MED ORDER — ALBUTEROL SULFATE (2.5 MG/3ML) 0.083% IN NEBU
5.0000 mg | INHALATION_SOLUTION | Freq: Once | RESPIRATORY_TRACT | Status: AC
  Administered 2015-12-17: 5 mg via RESPIRATORY_TRACT
  Filled 2015-12-17: qty 6

## 2015-12-17 MED ORDER — IPRATROPIUM-ALBUTEROL 0.5-2.5 (3) MG/3ML IN SOLN
3.0000 mL | Freq: Once | RESPIRATORY_TRACT | Status: AC
  Administered 2015-12-17: 3 mL via RESPIRATORY_TRACT
  Filled 2015-12-17: qty 3

## 2015-12-17 MED ORDER — LEVOTHYROXINE SODIUM 50 MCG PO TABS
100.0000 ug | ORAL_TABLET | Freq: Every day | ORAL | Status: DC
  Administered 2015-12-18 – 2015-12-20 (×3): 100 ug via ORAL
  Filled 2015-12-17 (×3): qty 2

## 2015-12-17 MED ORDER — PRAVASTATIN SODIUM 20 MG PO TABS
20.0000 mg | ORAL_TABLET | Freq: Every day | ORAL | Status: DC
  Administered 2015-12-17 – 2015-12-19 (×3): 20 mg via ORAL
  Filled 2015-12-17 (×3): qty 1

## 2015-12-17 MED ORDER — PREDNISONE 20 MG PO TABS: 40.0000 mg | ORAL_TABLET | Freq: Every day | ORAL | 0 refills | Status: AC

## 2015-12-17 MED ORDER — HEPARIN SODIUM (PORCINE) 5000 UNIT/ML IJ SOLN
5000.0000 [IU] | Freq: Three times a day (TID) | INTRAMUSCULAR | Status: DC
  Administered 2015-12-17 – 2015-12-19 (×6): 5000 [IU] via SUBCUTANEOUS
  Filled 2015-12-17 (×6): qty 1

## 2015-12-17 MED ORDER — ACETAMINOPHEN 325 MG PO TABS
650.0000 mg | ORAL_TABLET | Freq: Four times a day (QID) | ORAL | Status: DC | PRN
  Administered 2015-12-17 – 2015-12-19 (×2): 650 mg via ORAL
  Filled 2015-12-17 (×2): qty 2

## 2015-12-17 MED ORDER — ONDANSETRON HCL 4 MG/2ML IJ SOLN
4.0000 mg | Freq: Four times a day (QID) | INTRAMUSCULAR | Status: DC | PRN
  Administered 2015-12-18 (×2): 4 mg via INTRAVENOUS
  Filled 2015-12-17 (×2): qty 2

## 2015-12-17 MED ORDER — AZITHROMYCIN 500 MG PO TABS
500.0000 mg | ORAL_TABLET | Freq: Once | ORAL | Status: AC
  Administered 2015-12-17: 500 mg via ORAL
  Filled 2015-12-17: qty 1

## 2015-12-17 MED ORDER — METHYLPREDNISOLONE SODIUM SUCC 125 MG IJ SOLR
125.0000 mg | Freq: Once | INTRAMUSCULAR | Status: AC
  Administered 2015-12-17: 125 mg via INTRAVENOUS
  Filled 2015-12-17: qty 2

## 2015-12-17 MED ORDER — SODIUM CHLORIDE 0.9 % IV SOLN
INTRAVENOUS | Status: DC
  Administered 2015-12-17: 16:00:00 via INTRAVENOUS

## 2015-12-17 MED ORDER — TRAZODONE HCL 50 MG PO TABS: 25.0000 mg | ORAL_TABLET | Freq: Every evening | ORAL | Status: DC | PRN

## 2015-12-17 MED ORDER — ALPRAZOLAM 0.5 MG PO TABS
0.5000 mg | ORAL_TABLET | Freq: Three times a day (TID) | ORAL | Status: DC | PRN
  Administered 2015-12-17 – 2015-12-20 (×6): 0.5 mg via ORAL
  Filled 2015-12-17 (×6): qty 1

## 2015-12-17 MED ORDER — PANTOPRAZOLE SODIUM 40 MG PO TBEC
40.0000 mg | DELAYED_RELEASE_TABLET | Freq: Every day | ORAL | Status: DC
  Administered 2015-12-17 – 2015-12-20 (×4): 40 mg via ORAL
  Filled 2015-12-17 (×5): qty 1

## 2015-12-17 NOTE — Plan of Care (Signed)
Problem: Safety: Goal: Ability to remain free from injury will improve Outcome: Progressing Fall precautions in place  Problem: Pain Managment: Goal: General experience of comfort will improve Outcome: Progressing Prn medications  Problem: Tissue Perfusion: Goal: Risk factors for ineffective tissue perfusion will decrease Outcome: Progressing SQ heparin  Problem: Phase I Progression Outcomes Goal: O2 sats > or equal 90% or at baseline Outcome: Not Progressing O2 95 on 3LO2 per [place]

## 2015-12-17 NOTE — H&P (Signed)
[place] [facility] [place] NAME: [name]    MR#:  [id number]  DATE OF BIRTH:  [birth year]  DATE OF ADMISSION:  12/17/2015  PRIMARY CARE PHYSICIAN: Keith Rake, MD   REQUESTING/REFERRING PHYSICIAN: Dr. Merlyn Lot  CHIEF COMPLAINT: Shortness of breath    Chief Complaint  Patient presents with  . Shortness of Breath    HISTORY OF PRESENT ILLNESS:  [name]  is a 65 y.o. female with a known history of Essential hypertension, hypothyroidism, anxiety came in because of shortness of breath, wheezing. Patient has a history of heavy tobacco abuse, 50 pack years history. Started to have shortness of breath since  Thursday associated wheezing, cough, fever. Patient had some arm sinus drainage and cough and yellow phlegm. O2 saturations were 97% on room air when she came. Started on 2 L of oxygen, she is saturating 94%. Tachycardic at heart rate 1 20 bpm.  PAST MEDICAL HISTORY:   Past Medical History:  Diagnosis Date  . Anxiety   . Arthritis   . Chronic back pain   . Depression   . GERD (gastroesophageal reflux disease)   . Hypertension   . Hypothyroidism   . Pneumonia    RECENT X 3  . PONV (postoperative nausea and vomiting)   . Psoriasis   . Thyroid disease     PAST SURGICAL HISTOIRY:   Past Surgical History:  Procedure Laterality Date  . ABDOMINAL HYSTERECTOMY     heavy bleeding  . bladder stimulator    . CARPAL TUNNEL RELEASE    . CATARACT EXTRACTION W/PHACO Right 03/07/2015   Procedure: CATARACT EXTRACTION PHACO AND INTRAOCULAR LENS PLACEMENT (IOC);  Surgeon: Estill Cotta, MD;  Location: [facility];  Service: Ophthalmology;  Laterality: Right;  [name] 01:14 AP% 26.4 CDE 33.04 fluid pack lot # [id number]  . CATARACT EXTRACTION W/PHACO Left 03/21/2015   Procedure: CATARACT EXTRACTION PHACO AND INTRAOCULAR LENS PLACEMENT (IOC);  Surgeon: Estill Cotta, MD;  Location: [facility];  Service: Ophthalmology;  Laterality: Left;  [name]  01:21 AP% 25.6 CDE 39.78 fluid pack lot # [id number]  . HAND SURGERY Right   . INTERSTIM IMPLANT REMOVAL N/A 09/07/2015   Procedure: REMOVAL OF INTERSTIM IMPLANT;  Surgeon: Bjorn Loser, MD;  Location: [facility];  Service: Urology;  Laterality: N/A;  . OOPHORECTOMY Right    ovarian cyst    SOCIAL HISTORY:   Social History  Substance Use Topics  . Smoking status: Current Every Day Smoker    Packs/day: 1.00    Types: Cigarettes  . Smokeless tobacco: Never Used  . Alcohol use No    FAMILY HISTORY:   Family History  Problem Relation Age of Onset  . Heart disease Mother   . COPD Father     DRUG ALLERGIES:   Allergies  Allergen Reactions  . Ciprofloxacin Hives and Diarrhea  . Oxycodone Diarrhea  . Oxycodone-Acetaminophen Diarrhea  . Oxycodone-Acetaminophen Diarrhea  . Sulfa Antibiotics Rash    Throat, mouth Throat, mouth    REVIEW OF SYSTEMS:  CONSTITUTIONAL: No fever, fatigue or weakness.  EYES: No blurred or double vision.  EARS, NOSE, AND THROAT: No tinnitus or ear pain.  RESPIRATORY: Cough, shortness of breath, wheezing.  CARDIOVASCULAR: No chest pain, orthopnea, edema.  GASTROINTESTINAL: No nausea, vomiting, diarrhea or abdominal pain.  GENITOURINARY: No dysuria, hematuria.  ENDOCRINE: No polyuria, nocturia,  HEMATOLOGY: No anemia, easy bruising or bleeding SKIN: No rash or lesion. MUSCULOSKELETAL: No joint pain or arthritis.  NEUROLOGIC: No tingling, numbness, weakness.  PSYCHIATRY: No anxiety or depression.   MEDICATIONS AT HOME:   Prior to Admission medications   Medication Sig Start Date End Date Taking? Authorizing Provider  acetaminophen (TYLENOL) 500 MG tablet Take 500 mg by mouth every 6 (six) hours as needed for moderate pain. Reported on 07/01/2015   Yes Historical Provider, MD  ALPRAZolam Duanne Moron) 0.5 MG tablet Take 1 tablet (0.5 mg total) by mouth 3 (three) times daily as needed for anxiety. 12/13/15  Yes Roselee Nova, MD  ALPRAZolam Duanne Moron)  1 MG tablet Take 1 tablet (1 mg total) by mouth at bedtime as needed for anxiety. 12/13/15  Yes Roselee Nova, MD  amLODipine (NORVASC) 5 MG tablet Take 1 tablet (5 mg total) by mouth daily. 11/14/15  Yes Roselee Nova, MD  citalopram (CELEXA) 20 MG tablet Take 1 tablet (20 mg total) by mouth daily. 09/15/15  Yes Roselee Nova, MD  clobetasol ointment (TEMOVATE) AB-123456789 % Apply 1 application topically daily. Use twice a day for 2 weeks, then daily for 4 weeks. 06/09/15  Yes Alanda Slim Defrancesco, MD  lisinopril (PRINIVIL,ZESTRIL) 10 MG tablet Take 0.5 tablets (5 mg total) by mouth every morning. Take 5 mg every morning. 09/01/15  Yes Roselee Nova, MD  omeprazole (PRILOSEC) 20 MG capsule Take 1 capsule (20 mg total) by mouth daily. 12/13/15  Yes Roselee Nova, MD  pravastatin (PRAVACHOL) 20 MG tablet Take by mouth. 08/22/15 08/21/16 Yes Historical Provider, MD  Secukinumab (COSENTYX [place]) Inject into the skin every 30 (thirty) days. 2 injections per month   Yes Historical Provider, MD  SYNTHROID 100 MCG tablet TAKE ONE TABLET BY MOUTH EVERY MORNING BEFORE BREAKFAST 09/27/15  Yes Roselee Nova, MD  albuterol (PROVENTIL HFA;VENTOLIN HFA) 108 (90 Base) MCG/ACT inhaler Inhale 2 puffs into the lungs every 6 (six) hours as needed for wheezing or shortness of breath. 12/17/15   Merlyn Lot, MD  azithromycin (ZITHROMAX Z-PAK) 250 MG tablet followed by 1 tablet (250 mg) once daily on Days 2 through 5. 12/17/15 12/22/15  Merlyn Lot, MD  predniSONE (DELTASONE) 20 MG tablet Take 2 tablets (40 mg total) by mouth daily. 12/17/15 12/21/15  Merlyn Lot, MD      VITAL SIGNS:  Blood pressure 136/80, pulse (!) 104, temperature 98.2 F (36.8 C), temperature source Oral, resp. rate 18, height 5' 2.5" (1.588 m), weight 79.8 kg (176 lb), SpO2 94 %.  PHYSICAL EXAMINATION:  GENERAL:  65 y.o.-year-old patient lying in the bed with no acute distress.  EYES: Pupils equal, round, reactive to light and  accommodation. No scleral icterus. Extraocular muscles intact.  HEENT: Head atraumatic, normocephalic. Oropharynx and nasopharynx clear.  NECK:  Supple, no jugular venous distention. No thyroid enlargement, no tenderness.  LUNGS: Bilateral expiratory wheezing present in all lungs.no, rales,rhonchi or crepitation. No use of accessory muscles of respiration.  CARDIOVASCULAR: S1, S2 tachycardic. No murmurs, rubs, or gallops.  ABDOMEN: Soft, nontender, nondistended. Bowel sounds present. No organomegaly or mass.  EXTREMITIES: No pedal edema, cyanosis, or clubbing.  NEUROLOGIC: Cranial nerves II through XII are intact. Muscle strength 5/5 in all extremities. Sensation intact. Gait not checked.  PSYCHIATRIC: The patient is alert and oriented x 3.  SKIN: No obvious rash, lesion, or ulcer.   LABORATORY PANEL:   CBC  Recent Labs Lab 12/17/15 0936  WBC 6.8  HGB 15.8  HCT 46.2  PLT 225   ------------------------------------------------------------------------------------------------------------------  Chemistries  Recent Labs Lab 12/13/15 1028 12/17/15 0936  NA 142 136  K 4.5 3.6  CL 103 99*  CO2 32* 30  GLUCOSE 110* 171*  BUN 9 9  CREATININE 0.85 0.83  CALCIUM 9.5 9.0  AST 20  --   ALT 13  --   ALKPHOS 77  --   BILITOT 0.4  --    ------------------------------------------------------------------------------------------------------------------  Cardiac Enzymes  Recent Labs Lab 12/17/15 0936  TROPONINI <0.03   ------------------------------------------------------------------------------------------------------------------  RADIOLOGY:  Dg Chest 2 View  Result Date: 12/17/2015 CLINICAL DATA:  Patient with cough and centralized chest pain. EXAM: CHEST  2 VIEW COMPARISON:  Chest radiograph 12/23/2014. FINDINGS: Normal cardiac and mediastinal contours. No consolidative pulmonary opacities. No pleural effusion or pneumothorax. Regional skeleton is unremarkable. IMPRESSION:  No active cardiopulmonary disease. Electronically Signed   By: Lovey Newcomer M.D.   On: 12/17/2015 10:05    EKG:   Orders placed or performed during the hospital encounter of 12/17/15  . ED EKG  . ED EKG  sinus tachycardia 106 bpm. IMPRESSION AND PLAN:   22.65 year old female patient with acute respiratory failure and hypoxia due to COPD exacerbation: Admit to telemetry, start on oxygen, started the IV steroids, use xopenox  Nebulizer because of tachycardia. #2 .sinus tachycardia secondary to respiratory distress due to wheezing and COPD Act: Use the Cardizem 60 mg every 8 hours and also monitor on telemetry. #3 history of anxiety: Use the Xanax at home dose. #4 history of hypothyroidism; continue Synthroid 5.Essential hypertension: Hold off on the amlodipine but use Cardizem, lisinopril.  #6 heavy tobacco abuse: counseled against the smoking  For 15 min,offered nicotine patch advised her to slowly cut down and quit smoking because patient has COPD now has acute respiratory with failure with hypoxia, evaluate for home oxygen needed discharge.   All the records are reviewed and case discussed with ED provider. Management plans discussed with the patient, family and they are in agreement.  CODE STATUS: Full code   TOTAL TIME TAKING CARE OF THIS PATIENT:  70minutes.    Epifanio Lesches M.D on 12/17/2015 at 2:15 PM  Between 7am to 6pm - Pager - [phone]  After 6pm go to [email or web address] - password [email or web address] [facility]  Office  [phone]  CC: Primary care physician; Keith Rake, MD  Note: This dictation was prepared with Dragon dictation along with smaller phrase technology. Any transcriptional errors that result from this process are unintentional.

## 2015-12-17 NOTE — Progress Notes (Signed)
65yo wf admitted to room 248 via stretcher from ED.  A&O x4, gait unsteady at this time.  No distress on 3LO2 per Mathews.  Lungs diminished lower lobes bil.  Cardiac monitor placed on pt and verified with Lexine Baton, RN, pt denies chest pain at this time.  Abdomen benign.  SL rt ac flushes well.  Eczema noted to bil elbows. Oriented to room and surroundings, POC reviewed with pt. Denies need at this time. CB in reach, SR up x 2, bed alarm on.

## 2015-12-17 NOTE — ED Provider Notes (Signed)
[facility] Emergency Department Provider Note    First MD Initiated Contact with Patient 12/17/15 1025     (approximate)  I have reviewed the triage vital signs and the nursing notes.   HISTORY  Chief Complaint Shortness of Breath    HPI [name] is a 65 y.o. female  several days of worsening shortness of breath and productive cough associated with worsening wheezing. Patient does smoke 1-1/2 packs per day but has never been told that she has COPD. States that she was diagnosed with pneumonia 3 times last year. Denies any lower STEMI swelling. Denies any chest pain. Denies any fevers. No nausea or vomiting. She is up-to-date on vaccines. States that the shortness of breath is much worse with walking. She does not have any orthopnea.   Past Medical History:  Diagnosis Date  . Anxiety   . Arthritis   . Chronic back pain   . Depression   . GERD (gastroesophageal reflux disease)   . Hypertension   . Hypothyroidism   . Pneumonia    RECENT X 3  . PONV (postoperative nausea and vomiting)   . Psoriasis   . Thyroid disease    Family History  Problem Relation Age of Onset  . Heart disease Mother   . COPD Father    Past Surgical History:  Procedure Laterality Date  . ABDOMINAL HYSTERECTOMY     heavy bleeding  . bladder stimulator    . CARPAL TUNNEL RELEASE    . CATARACT EXTRACTION W/PHACO Right 03/07/2015   Procedure: CATARACT EXTRACTION PHACO AND INTRAOCULAR LENS PLACEMENT (IOC);  Surgeon: Estill Cotta, MD;  Location: [facility] ORS;  Service: Ophthalmology;  Laterality: Right;  [name] 01:14 AP% 26.4 CDE 33.04 fluid pack lot # [id number]  . CATARACT EXTRACTION W/PHACO Left 03/21/2015   Procedure: CATARACT EXTRACTION PHACO AND INTRAOCULAR LENS PLACEMENT (IOC);  Surgeon: Estill Cotta, MD;  Location: [facility] ORS;  Service: Ophthalmology;  Laterality: Left;  [name] 01:21 AP% 25.6 CDE 39.78 fluid pack lot # [id number]  . HAND SURGERY Right   . INTERSTIM  IMPLANT REMOVAL N/A 09/07/2015   Procedure: REMOVAL OF INTERSTIM IMPLANT;  Surgeon: Bjorn Loser, MD;  Location: [facility] ORS;  Service: Urology;  Laterality: N/A;  . OOPHORECTOMY Right    ovarian cyst   Patient Active Problem List   Diagnosis Date Noted  . Abdominal pain, acute, epigastric 12/13/2015  . Bilateral carotid artery stenosis 08/22/2015  . Atherosclerotic peripheral vascular disease with intermittent claudication ([place]) 08/04/2015  . Benign essential HTN 08/04/2015  . Mixed hyperlipidemia 08/04/2015  . Shortness of breath 08/04/2015  . Abnormal finding on EKG 07/25/2015  . Abnormal ECG 07/01/2015  . Narrowing of intervertebral disc space 07/01/2015  . Anxiety and depression 07/01/2015  . Dilatation pupillary 07/01/2015  . Gravida 2 para 2 07/01/2015  . Panic disorder without agoraphobia with moderate panic attacks 07/01/2015  . Parity 2 07/01/2015  . Muscle cramps at night 06/24/2015  . Prediabetes 06/09/2015  . Bladder disorder 06/03/2015  . Lichen sclerosus [id number]  . Chronic vulvitis 02/20/2015  . Annual physical exam 02/01/2015  . Abdominal pain, LLQ 02/01/2015  . Productive cough 12/23/2014  . Acute maxillary sinusitis 12/23/2014  . Hypertension 12/09/2014  . Chronic LBP 09/16/2014  . Clinical depression 08/19/2014  . Adult hypothyroidism 08/19/2014  . Anxiety disorder 06/23/2014  . Acquired hypothyroidism 01/13/2014  . Continuous opioid dependence ([place]) 07/28/2013  . Chronic pain associated with significant psychosocial dysfunction 08/05/2012  . Other long term (  current) drug therapy 08/05/2012  . Pain medication agreement signed 08/05/2012  . Infection of urinary tract 05/06/2012  . Acid reflux 07/01/2009  . Bladder retention 10/25/2008  . Lumbar canal stenosis 10/21/2008  . Current tobacco use 09/19/2008  . Cephalalgia 09/02/2008  . Degeneration of intervertebral disc of lumbosacral region 03/22/2008      Prior to Admission medications     Medication Sig Start Date End Date Taking? Authorizing Provider  acetaminophen (TYLENOL) 500 MG tablet Take 500 mg by mouth every 6 (six) hours as needed for moderate pain. Reported on 07/01/2015   Yes Historical Provider, MD  ALPRAZolam Duanne Moron) 0.5 MG tablet Take 1 tablet (0.5 mg total) by mouth 3 (three) times daily as needed for anxiety. 12/13/15  Yes Roselee Nova, MD  ALPRAZolam Duanne Moron) 1 MG tablet Take 1 tablet (1 mg total) by mouth at bedtime as needed for anxiety. 12/13/15  Yes Roselee Nova, MD  amLODipine (NORVASC) 5 MG tablet Take 1 tablet (5 mg total) by mouth daily. 11/14/15  Yes Roselee Nova, MD  citalopram (CELEXA) 20 MG tablet Take 1 tablet (20 mg total) by mouth daily. 09/15/15  Yes Roselee Nova, MD  clobetasol ointment (TEMOVATE) [id number] % Apply 1 application topically daily. Use twice a day for 2 weeks, then daily for 4 weeks. 06/09/15  Yes Alanda Slim Defrancesco, MD  lisinopril (PRINIVIL,ZESTRIL) 10 MG tablet Take 0.5 tablets (5 mg total) by mouth every morning. Take 5 mg every morning. 09/01/15  Yes Roselee Nova, MD  omeprazole (PRILOSEC) 20 MG capsule Take 1 capsule (20 mg total) by mouth daily. 12/13/15  Yes Roselee Nova, MD  pravastatin (PRAVACHOL) 20 MG tablet Take by mouth. 08/22/15 08/21/16 Yes Historical Provider, MD  Secukinumab (COSENTYX [place]) Inject into the skin every 30 (thirty) days. 2 injections per month   Yes Historical Provider, MD  SYNTHROID 100 MCG tablet TAKE ONE TABLET BY MOUTH EVERY MORNING BEFORE BREAKFAST 09/27/15  Yes Roselee Nova, MD    Allergies Ciprofloxacin; Oxycodone; Oxycodone-acetaminophen; Oxycodone-acetaminophen; and Sulfa antibiotics    Social History Social History  Substance Use Topics  . Smoking status: Current Every Day Smoker    Packs/day: 1.00    Types: Cigarettes  . Smokeless tobacco: Never Used  . Alcohol use No    Review of Systems Patient denies headaches, rhinorrhea, blurry vision, numbness, shortness of breath,  chest pain, edema, cough, abdominal pain, nausea, vomiting, diarrhea, dysuria, fevers, rashes or hallucinations unless otherwise stated above in HPI. ____________________________________________   PHYSICAL EXAM:  VITAL SIGNS: Vitals:   12/17/15 0930  BP: (!) 157/72  Pulse: (!) 101  Resp: 15  Temp: 98.2 F (36.8 C)    Constitutional: Alert and oriented. Well appearing and in no acute distress. Eyes: Conjunctivae are normal. PERRL. EOMI. Head: Atraumatic. Nose: No congestion/rhinnorhea. Mouth/Throat: Mucous membranes are moist.  Oropharynx non-erythematous. Neck: No stridor. Painless ROM. No cervical spine tenderness to palpation Hematological/Lymphatic/Immunilogical: No cervical lymphadenopathy. Cardiovascular: Normal rate, regular rhythm. Grossly normal heart sounds.  Good peripheral circulation. Respiratory: mild tachypena.  No retractions. Musical expiratory wheezing througout. Gastrointestinal: Soft and nontender. No distention. No abdominal bruits. No CVA tenderness. Genitourinary:  Musculoskeletal: No lower extremity tenderness nor edema.  No joint effusions. Neurologic:  Normal speech and language. No gross focal neurologic deficits are appreciated. No gait instability. Skin:  Skin is warm, dry and intact. No rash noted. Psychiatric: Mood and affect are normal. Speech and behavior  are normal.  ____________________________________________   LABS (all labs ordered are listed, but only abnormal results are displayed)  Results for orders placed or performed during the hospital encounter of 12/17/15 (from the past 24 hour(s))  Basic metabolic panel     Status: Abnormal   Collection Time: 12/17/15  9:36 AM  Result Value Ref Range   Sodium 136 135 - 145 mmol/L   Potassium 3.6 3.5 - 5.1 mmol/L   Chloride 99 (L) 101 - 111 mmol/L   CO2 30 22 - 32 mmol/L   Glucose, Bld 171 (H) 65 - 99 mg/dL   BUN 9 6 - 20 mg/dL   Creatinine, Ser 0.83 0.44 - 1.00 mg/dL   Calcium 9.0 8.9 -  10.3 mg/dL   GFR calc non Af Amer >60 >60 mL/min   GFR calc Af Amer >60 >60 mL/min   Anion gap 7 5 - 15  CBC     Status: None   Collection Time: 12/17/15  9:36 AM  Result Value Ref Range   WBC 6.8 3.6 - 11.0 K/uL   RBC 5.11 3.80 - 5.20 MIL/uL   Hemoglobin 15.8 12.0 - 16.0 g/dL   HCT 46.2 35.0 - 47.0 %   MCV 90.5 80.0 - 100.0 fL   MCH 30.8 26.0 - 34.0 pg   MCHC 34.1 32.0 - 36.0 g/dL   RDW 13.0 11.5 - 14.5 %   Platelets 225 150 - 440 K/uL  Troponin I     Status: None   Collection Time: 12/17/15  9:36 AM  Result Value Ref Range   Troponin I <0.03 <0.03 ng/mL   ____________________________________________  EKG My review and personal interpretation at Time: 9:43   Indication: SOB  Rate: 85  Rhythm: sinus Axis: normal Other: no acute ischemic changes, normal intervals ____________________________________________  RADIOLOGY  I personally reviewed all radiographic images ordered to evaluate for the above acute complaints and reviewed radiology reports and findings.  These findings were personally discussed with the patient.  Please see medical record for radiology report.  ____________________________________________   PROCEDURES  Procedure(s) performed: none Procedures    Critical Care performed: no ____________________________________________   INITIAL IMPRESSION / ASSESSMENT AND PLAN / ED COURSE  Pertinent labs & imaging results that were available during my care of the patient were reviewed by me and considered in my medical decision making (see chart for details).  DDX: Asthma, copd, CHF, pna, ptx, malignancy, Pe, anemia   Dru Verdoorn Motzer is a 64 y.o. who presents to the ED with Shortness of breath. Based on her diffuse end expiratory wheezing patient clinically consistent with obstructive pattern. History of physical not clinically consistent with ACS. Was clinically consistent with pulmonary embolism. No physical exam findings to suggest congestive heart failure.  Chest x-ray ordered to evaluate for pneumonia shows no focal infiltrate. Lab work otherwise reassuring. We'll give nebulizers, IV steroids for symptomatically treatment and her tachypnea.  Clinical Course as of Dec 16 1613  Sat Dec 17, 2015  1250 Patient reassessed and had marked improvement in her respiratory status. No moving much more air throughout. Still has musical breath sounds but patient also feeling better. Will ambulate with pulse ox to evaluate for any worsening dyspnea or hypoxia.  [PR]  1343 Patient was able to ambulate without significant desaturation or dyspnea. However upon returning to the bed and at rest the patient's oxygen saturation dipped down to 86 with a good waveform. Patient was instructed to take deep breaths with improvement.   [  PR]    Clinical Course User Index [PR] Merlyn Lot, MD     ____________________________________________   FINAL CLINICAL IMPRESSION(S) / ED DIAGNOSES  Final diagnoses:  COPD exacerbation ([place])  Acute respiratory failure with hypoxia ([place])      NEW MEDICATIONS STARTED DURING THIS VISIT:  New Prescriptions   No medications on file     Note:  This document was prepared using Dragon voice recognition software and may include unintentional dictation errors.    Merlyn Lot, MD 12/17/15 [phone]

## 2015-12-17 NOTE — ED Triage Notes (Signed)
Patient presents to the ED from [facility]  Patient is complaining of cough and shortness of breath.  Per staff at [facility], patient's oxygen level was 91% on room air and patient does not have history of COPD.  Patient has had pneumonia twice in the past few years.  Patient is speaking in full sentences but is having some difficulty breathing.

## 2015-12-18 ENCOUNTER — Inpatient Hospital Stay: Payer: PPO

## 2015-12-18 DIAGNOSIS — I10 Essential (primary) hypertension: Secondary | ICD-10-CM | POA: Diagnosis not present

## 2015-12-18 DIAGNOSIS — Z716 Tobacco abuse counseling: Secondary | ICD-10-CM | POA: Diagnosis not present

## 2015-12-18 DIAGNOSIS — R05 Cough: Secondary | ICD-10-CM | POA: Diagnosis not present

## 2015-12-18 DIAGNOSIS — J441 Chronic obstructive pulmonary disease with (acute) exacerbation: Secondary | ICD-10-CM | POA: Diagnosis not present

## 2015-12-18 DIAGNOSIS — J9601 Acute respiratory failure with hypoxia: Secondary | ICD-10-CM | POA: Diagnosis not present

## 2015-12-18 DIAGNOSIS — R000 Tachycardia, unspecified: Secondary | ICD-10-CM | POA: Diagnosis not present

## 2015-12-18 LAB — CBC
HEMATOCRIT: 43.7 % (ref 35.0–47.0)
Hemoglobin: 14.2 g/dL (ref 12.0–16.0)
MCH: 30 pg (ref 26.0–34.0)
MCHC: 32.6 g/dL (ref 32.0–36.0)
MCV: 92.2 fL (ref 80.0–100.0)
PLATELETS: 234 10*3/uL (ref 150–440)
RBC: 4.75 MIL/uL (ref 3.80–5.20)
RDW: 13.3 % (ref 11.5–14.5)
WBC: 22.7 10*3/uL — AB (ref 3.6–11.0)

## 2015-12-18 LAB — BASIC METABOLIC PANEL
Anion gap: 9 (ref 5–15)
BUN: 13 mg/dL (ref 6–20)
CHLORIDE: 101 mmol/L (ref 101–111)
CO2: 29 mmol/L (ref 22–32)
CREATININE: 1 mg/dL (ref 0.44–1.00)
Calcium: 9.4 mg/dL (ref 8.9–10.3)
GFR calc Af Amer: >60 mL/min (ref >60)
GFR calc non Af Amer: 57 mL/min — ABNORMAL LOW (ref >60)
GLUCOSE: 196 mg/dL — AB (ref 65–99)
Potassium: 3.9 mmol/L (ref 3.5–5.1)
SODIUM: 139 mmol/L (ref 135–145)

## 2015-12-18 LAB — GLUCOSE, CAPILLARY: Glucose-Capillary: 183 mg/dL — ABNORMAL HIGH (ref 65–99)

## 2015-12-18 MED ORDER — DILTIAZEM HCL ER COATED BEADS 120 MG PO CP24
120.0000 mg | ORAL_CAPSULE | Freq: Every day | ORAL | Status: DC
  Administered 2015-12-18 – 2015-12-20 (×3): 120 mg via ORAL
  Filled 2015-12-18 (×4): qty 1

## 2015-12-18 MED ORDER — DEXTROSE 5 % IV SOLN
1.0000 g | Freq: Every day | INTRAVENOUS | Status: DC
  Administered 2015-12-18: 1 g via INTRAVENOUS
  Filled 2015-12-18 (×2): qty 10

## 2015-12-18 MED ORDER — METHYLPREDNISOLONE SODIUM SUCC 40 MG IJ SOLR
40.0000 mg | Freq: Two times a day (BID) | INTRAMUSCULAR | Status: DC
  Administered 2015-12-18 – 2015-12-20 (×5): 40 mg via INTRAVENOUS
  Filled 2015-12-18 (×5): qty 1

## 2015-12-18 MED ORDER — HYDROCOD POLST-CPM POLST ER 10-8 MG/5ML PO SUER
5.0000 mL | Freq: Two times a day (BID) | ORAL | Status: DC | PRN
  Administered 2015-12-18 – 2015-12-19 (×3): 5 mL via ORAL
  Filled 2015-12-18 (×3): qty 5

## 2015-12-18 MED ORDER — DEXTROSE 5 % IV SOLN
500.0000 mg | Freq: Every day | INTRAVENOUS | Status: DC
  Administered 2015-12-18: 500 mg via INTRAVENOUS
  Filled 2015-12-18 (×2): qty 500

## 2015-12-18 NOTE — Progress Notes (Signed)
Zofran 4mg iv given for complaints of nausea, will monitor. 

## 2015-12-18 NOTE — Progress Notes (Signed)
Pt. Slept well through out the night with no c/o pain, SOB or acute distress noted. C/o anxiety x1, medicated with effective results. IV fluids continue. PT ran NSR/ST with HR ranging 90' to one teens.

## 2015-12-18 NOTE — Progress Notes (Addendum)
[place] [facility] [place] NAME: [name]    MR#:  [id number]  DATE OF BIRTH:  [birth year]  SUBJECTIVE:    patient still Very weak and with cough. She does not have a diagnosis of COPD.  REVIEW OF SYSTEMS:    Review of Systems  Constitutional: Positive for malaise/fatigue. Negative for chills and fever.  HENT: Negative.  Negative for ear discharge, ear pain, hearing loss, nosebleeds and sore throat.   Eyes: Negative.  Negative for blurred vision and pain.  Respiratory: Positive for sputum production, shortness of breath and wheezing. Negative for cough and hemoptysis.   Cardiovascular: Negative.  Negative for chest pain, palpitations and leg swelling.  Gastrointestinal: Negative.  Negative for abdominal pain, blood in stool, diarrhea, nausea and vomiting.  Genitourinary: Negative.  Negative for dysuria.  Musculoskeletal: Negative.  Negative for back pain.  Skin: Negative.   Neurological: Negative for dizziness, tremors, speech change, focal weakness, seizures and headaches.  Endo/Heme/Allergies: Negative.  Does not bruise/bleed easily.  Psychiatric/Behavioral: Negative.  Negative for depression, hallucinations and suicidal ideas.    Tolerating Diet: yes      DRUG ALLERGIES:   Allergies  Allergen Reactions  . Ciprofloxacin Hives and Diarrhea  . Oxycodone Diarrhea  . Oxycodone-Acetaminophen Diarrhea  . Oxycodone-Acetaminophen Diarrhea  . Sulfa Antibiotics Rash    Throat, mouth Throat, mouth    VITALS:  Blood pressure (!) 133/57, pulse (!) 105, temperature 98.2 F (36.8 C), temperature source Oral, resp. rate 20, height 5' 2.5" (1.588 m), weight 79.3 kg (174 lb 12.8 oz), SpO2 94 %.  PHYSICAL EXAMINATION:   Physical Exam  Constitutional: She is oriented to person, place, and time and well-developed, well-nourished, and in no distress. No distress.  HENT:  Head: Normocephalic.  Eyes: No scleral icterus.  Neck: Normal range of  motion. Neck supple. No JVD present. No tracheal deviation present.  Cardiovascular: Normal rate, regular rhythm and normal heart sounds.  Exam reveals no gallop and no friction rub.   No murmur heard. Pulmonary/Chest: Effort normal. No respiratory distress. She has no wheezes. She exhibits no tenderness.  Crackles left lower lobe  Abdominal: Soft. Bowel sounds are normal. She exhibits no distension and no mass. There is no tenderness. There is no rebound and no guarding.  Musculoskeletal: Normal range of motion. She exhibits no edema.  Neurological: She is alert and oriented to person, place, and time.  Skin: Skin is warm. No rash noted. No erythema.  Psychiatric: Affect and judgment normal.      LABORATORY PANEL:   CBC  Recent Labs Lab 12/18/15 0601  WBC 22.7*  HGB 14.2  HCT 43.7  PLT 234   ------------------------------------------------------------------------------------------------------------------  Chemistries   Recent Labs Lab 12/13/15 1028  12/18/15 0601  NA 142  < > 139  K 4.5  < > 3.9  CL 103  < > 101  CO2 32*  < > 29  GLUCOSE 110*  < > 196*  BUN 9  < > 13  CREATININE 0.85  < > 1.00  CALCIUM 9.5  < > 9.4  AST 20  --   --   ALT 13  --   --   ALKPHOS 77  --   --   BILITOT 0.4  --   --   < > = values in this interval not displayed. ------------------------------------------------------------------------------------------------------------------  Cardiac Enzymes  Recent Labs Lab 12/17/15 0936  TROPONINI <0.03   ------------------------------------------------------------------------------------------------------------------  RADIOLOGY:  Dg Chest 2  View  Result Date: 12/17/2015 CLINICAL DATA:  Patient with cough and centralized chest pain. EXAM: CHEST  2 VIEW COMPARISON:  Chest radiograph 12/23/2014. FINDINGS: Normal cardiac and mediastinal contours. No consolidative pulmonary opacities. No pleural effusion or pneumothorax. Regional skeleton is  unremarkable. IMPRESSION: No active cardiopulmonary disease. Electronically Signed   By: Lovey Newcomer M.D.   On: 12/17/2015 10:05     ASSESSMENT AND PLAN:   65 y.o. female with a known history of Essential hypertension, hypothyroidism, tobacco dependence who presented due to shortness of breath and wheezing.  1. Acute hypoxic respiratory failure with reactive airway disease and bronchospasm: Wean IV steroids Wean oxygen to room air Tussionex for cough Repeat chest x-ray for evaluation for left-sided pneumonia Continue inhalers and nebulized treatments Leukocytosis most likely related to steroids. Repeat CBC in a.m.  2. Sinus tachycardia: This is due to acute hypoxic respiratory failure and nebulizer treatments Continue telemetry monitoring Continue Cardizem. 3. Tobacco dependence: Patient highly encourage assessment. Patient counseled 3 minutes by myself as well as admitting physician.  4. Anxiety: Continue when necessary and Xanax  5. Hypothyroid: Continue Synthroid  6. Essential hypertension: Continue Cardizem and lisinopril   Physical therapy evaluation  Management plans discussed with the patient and she is in agreement.  CODE STATUS: full  TOTAL TIME TAKING CARE OF THIS PATIENT: 30 minutes.     POSSIBLE D/C 1-2 days, DEPENDING ON CLINICAL CONDITION.   Adreyan Carbajal M.D on 12/18/2015 at 10:32 AM  Between 7am to 6pm - Pager - [phone] After 6pm go to [email or web address] - password [email or web address] [place] [facility]  Office  [phone]  CC: Primary care physician; Keith Rake, MD  Note: This dictation was prepared with Dragon dictation along with smaller phrase technology. Any transcriptional errors that result from this process are unintentional.

## 2015-12-18 NOTE — Evaluation (Signed)
Physical Therapy Evaluation Patient Details Name: [name] MRN: [id number] DOB: [birth year] Today's Date: 12/18/2015   History of Present Illness  [name]  is a 65 y.o. female with a known history of Essential hypertension, hypothyroidism, anxiety came in because of shortness of breath, wheezing. Patient has a history of heavy tobacco abuse, 50 pack years history. Started to have shortness of breath since  Thursday associated wheezing, cough, fever.  Patient was diagnosed with COPD exacerbation.   Clinical Impression  [name] came to ED with increased shortness of breath. She was diagnosed with COPD exacerbation. Patient was living alone and independent in all ADLs as prior level of function. Currently she is independent in bed mobility. She is CGA with RW for sit<>Stand transfers. Patient ambulated approximately 100 feet with RW, CGA with cues to increase step length and improve trunk control. Patient ambulated without O2 support (on room air) and her SPo2 dropped to 87% during ambulation. Patient's SPo2 is 93% while on 3L O2. Patient demonstrates impaired balance with unsteadiness in trunk. She also demonstrates increased weakness in BLE. Patient would benefit from additional skilled PT intervention to improve strength, balance and gait safety for return to PLOF.     Follow Up Recommendations SNF;Supervision for mobility/OOB    Equipment Recommendations   (to be determined; )    Recommendations for Other Services Rehab consult     Precautions / Restrictions Precautions Precautions: Fall Restrictions Weight Bearing Restrictions: No      Mobility  Bed Mobility Overal bed mobility: Independent             General bed mobility comments: uses bed rails but independent in supine<>sitting; able to scoot in bed independently;   Transfers Overall transfer level: Needs assistance Equipment used: Rolling walker (2 wheeled) Transfers: Sit to/from Stand Sit to Stand: Min  guard         General transfer comment: patient attempted sit<>stand without AD and was min A for balance; she is CGA with RW; demonstrates unsteadiness in trunk;   Ambulation/Gait Ambulation/Gait assistance: Min guard Ambulation Distance (Feet): 100 Feet Assistive device: Rolling walker (2 wheeled) Gait Pattern/deviations: Step-through pattern;Decreased step length - right;Decreased step length - left;Decreased dorsiflexion - right;Decreased dorsiflexion - left;Shuffle;Wide base of support;Trunk flexed Gait velocity: decreased   General Gait Details: patient slow with ambulation; see vitals; SPO2 dropped to 87% during ambulation while on room air;   Stairs            Wheelchair Mobility    Modified Rankin (Stroke Patients Only)       Balance Overall balance assessment: Needs assistance Sitting-balance support: No upper extremity supported;Feet supported Sitting balance-Leahy Scale: Good     Standing balance support: No upper extremity supported Standing balance-Leahy Scale: Poor Standing balance comment: demonstrates increased unsteadiness in trunk when standing unsupported; she demonstrates fair balance with 2 HHA                             Pertinent Vitals/Pain Pain Assessment: No/denies pain    Home Living Family/patient expects to be discharged to:: Private residence Living Arrangements: Alone Available Help at Discharge: Family (however no one is available during the day) Type of Home: Apartment Home Access: Level entry     Home Layout: One level Home Equipment: None      Prior Function Level of Independence: Independent         Comments: was driving; did all meals  and cleaning; independent in walking at home and in community;      Hand Dominance        Extremity/Trunk Assessment   Upper Extremity Assessment: Overall WFL for tasks assessed           Lower Extremity Assessment: RLE deficits/detail;LLE deficits/detail RLE  Deficits / Details: hip grossly 3+/5, knee 4-/5; intact light touch sensation;  LLE Deficits / Details: hip grossly 3+/5, knee 4-/5; intact light touch sensation;   Cervical / Trunk Assessment: Kyphotic  Communication   Communication: No difficulties  Cognition Arousal/Alertness: Awake/alert Behavior During Therapy: WFL for tasks assessed/performed Overall Cognitive Status: Within Functional Limits for tasks assessed                      General Comments      Exercises     Assessment/Plan    PT Assessment Patient needs continued PT services  PT Problem List Decreased strength;Decreased mobility;Decreased safety awareness;Decreased activity tolerance;Cardiopulmonary status limiting activity;Decreased balance;Decreased knowledge of use of DME          PT Treatment Interventions DME instruction;Gait training;Functional mobility training;Neuromuscular re-education;Balance training;Therapeutic exercise;Therapeutic activities;Patient/family education    PT Goals (Current goals can be found in the Care Plan section)  Acute Rehab PT Goals Patient Stated Goal: "I want to walk better and get stronger." PT Goal Formulation: With patient Time For Goal Achievement: 01/01/16 Potential to Achieve Goals: Good    Frequency Min 2X/week   Barriers to discharge Decreased caregiver support lives alone; family reports that they are close by, but they work during the day;     Co-evaluation               End of Session Equipment Utilized During Treatment: Gait belt;Oxygen Activity Tolerance: Patient limited by fatigue Patient left: in bed;with call bell/phone within reach;with bed alarm set Nurse Communication: Mobility status (SPO2 drop while on room air during ambulation; )         Time: 1400-1425 PT Time Calculation (min) (ACUTE ONLY): 25 min   Charges:   PT Evaluation $PT Eval Moderate Complexity: 1 Procedure     PT G Codes:        [name] PT  ,DPT 12/18/2015, 2:35 PM

## 2015-12-18 NOTE — Plan of Care (Signed)
Problem: Safety: Goal: Ability to remain free from injury will improve Outcome: Progressing Fall precautions in place  Problem: Pain Managment: Goal: General experience of comfort will improve Outcome: Progressing Prn medications  Problem: Tissue Perfusion: Goal: Risk factors for ineffective tissue perfusion will decrease Outcome: Progressing SQ Heparin  Problem: Phase I Progression Outcomes Goal: O2 sats > or equal 90% or at baseline Outcome: Not Progressing Remains on O2 at 3 L

## 2015-12-19 DIAGNOSIS — I10 Essential (primary) hypertension: Secondary | ICD-10-CM | POA: Diagnosis not present

## 2015-12-19 DIAGNOSIS — J9601 Acute respiratory failure with hypoxia: Secondary | ICD-10-CM | POA: Diagnosis not present

## 2015-12-19 DIAGNOSIS — J189 Pneumonia, unspecified organism: Secondary | ICD-10-CM | POA: Diagnosis not present

## 2015-12-19 DIAGNOSIS — J441 Chronic obstructive pulmonary disease with (acute) exacerbation: Secondary | ICD-10-CM | POA: Diagnosis not present

## 2015-12-19 LAB — CBC
HEMATOCRIT: 43.4 % (ref 35.0–47.0)
HEMOGLOBIN: 14.4 g/dL (ref 12.0–16.0)
MCH: 30.5 pg (ref 26.0–34.0)
MCHC: 33.1 g/dL (ref 32.0–36.0)
MCV: 92.3 fL (ref 80.0–100.0)
Platelets: 235 10*3/uL (ref 150–440)
RBC: 4.71 MIL/uL (ref 3.80–5.20)
RDW: 13.7 % (ref 11.5–14.5)
WBC: 24.3 10*3/uL — AB (ref 3.6–11.0)

## 2015-12-19 LAB — GLUCOSE, CAPILLARY: Glucose-Capillary: 177 mg/dL — ABNORMAL HIGH (ref 65–99)

## 2015-12-19 LAB — MRSA PCR SCREENING: MRSA BY PCR: NEGATIVE

## 2015-12-19 MED ORDER — AZITHROMYCIN 250 MG PO TABS
500.0000 mg | ORAL_TABLET | Freq: Every day | ORAL | Status: DC
  Administered 2015-12-19: 500 mg via ORAL
  Filled 2015-12-19: qty 2

## 2015-12-19 MED ORDER — ENOXAPARIN SODIUM 40 MG/0.4ML ~~LOC~~ SOLN
40.0000 mg | SUBCUTANEOUS | Status: DC
  Administered 2015-12-19: 40 mg via SUBCUTANEOUS
  Filled 2015-12-19 (×2): qty 0.4

## 2015-12-19 MED ORDER — CEFTRIAXONE SODIUM-DEXTROSE 1-3.74 GM-% IV SOLR
1.0000 g | INTRAVENOUS | Status: DC
  Administered 2015-12-19: 1 g via INTRAVENOUS
  Filled 2015-12-19 (×2): qty 50

## 2015-12-19 MED ORDER — GUAIFENESIN-DM 100-10 MG/5ML PO SYRP
5.0000 mL | ORAL_SOLUTION | ORAL | Status: DC | PRN
  Administered 2015-12-19 – 2015-12-20 (×6): 5 mL via ORAL
  Filled 2015-12-19 (×6): qty 5

## 2015-12-19 NOTE — Progress Notes (Signed)
Dr. Marcille Blanco returned page with new order for cough.

## 2015-12-19 NOTE — Progress Notes (Signed)
[facility] [place] NAME: [name]    MR#:  [id number]  DATE OF BIRTH:  March 05, 1950  SUBJECTIVE:    Patient symptoms have improved since yesterday  REVIEW OF SYSTEMS:    Review of Systems  Constitutional: Negative for chills, fever and malaise/fatigue.  HENT: Negative.  Negative for ear discharge, ear pain, hearing loss, nosebleeds and sore throat.   Eyes: Negative.  Negative for blurred vision and pain.  Respiratory: Positive for sputum production, shortness of breath and wheezing. Negative for cough and hemoptysis.   Cardiovascular: Negative.  Negative for chest pain, palpitations and leg swelling.  Gastrointestinal: Negative.  Negative for abdominal pain, blood in stool, diarrhea, nausea and vomiting.  Genitourinary: Negative.  Negative for dysuria.  Musculoskeletal: Negative.  Negative for back pain.  Skin: Negative.   Neurological: Positive for weakness. Negative for dizziness, tremors, speech change, focal weakness, seizures and headaches.  Endo/Heme/Allergies: Negative.  Does not bruise/bleed easily.  Psychiatric/Behavioral: Negative.  Negative for depression, hallucinations and suicidal ideas.    Tolerating Diet: yes      DRUG ALLERGIES:   Allergies  Allergen Reactions  . Ciprofloxacin Hives and Diarrhea  . Oxycodone Diarrhea  . Oxycodone-Acetaminophen Diarrhea  . Oxycodone-Acetaminophen Diarrhea  . Sulfa Antibiotics Rash    Throat, mouth Throat, mouth    VITALS:  Blood pressure (!) 119/55, pulse 99, temperature 97.6 F (36.4 C), temperature source Oral, resp. rate 18, height 5' 2.5" (1.588 m), weight 80.6 kg (177 lb 9.6 oz), SpO2 95 %.  PHYSICAL EXAMINATION:   Physical Exam  Constitutional: She is oriented to person, place, and time and well-developed, well-nourished, and in no distress. No distress.  HENT:  Head: Normocephalic.  Eyes: No scleral icterus.  Neck: Normal range of motion. Neck supple. No JVD  present. No tracheal deviation present.  Cardiovascular: Normal rate, regular rhythm and normal heart sounds.  Exam reveals no gallop and no friction rub.   No murmur heard. Pulmonary/Chest: Effort normal. No respiratory distress. She has wheezes. She exhibits no tenderness.  Crackles left lower lobe Tight fair air movement  Abdominal: Soft. Bowel sounds are normal. She exhibits no distension and no mass. There is no tenderness. There is no rebound and no guarding.  Musculoskeletal: Normal range of motion. She exhibits no edema.  Neurological: She is alert and oriented to person, place, and time.  Skin: Skin is warm. No rash noted. No erythema.  Psychiatric: Affect and judgment normal.      LABORATORY PANEL:   CBC  Recent Labs Lab 12/19/15 0510  WBC 24.3*  HGB 14.4  HCT 43.4  PLT 235   ------------------------------------------------------------------------------------------------------------------  Chemistries   Recent Labs Lab 12/13/15 1028  12/18/15 0601  NA 142  < > 139  K 4.5  < > 3.9  CL 103  < > 101  CO2 32*  < > 29  GLUCOSE 110*  < > 196*  BUN 9  < > 13  CREATININE 0.85  < > 1.00  CALCIUM 9.5  < > 9.4  AST 20  --   --   ALT 13  --   --   ALKPHOS 77  --   --   BILITOT 0.4  --   --   < > = values in this interval not displayed. ------------------------------------------------------------------------------------------------------------------  Cardiac Enzymes  Recent Labs Lab 12/17/15 0936  TROPONINI <0.03   ------------------------------------------------------------------------------------------------------------------  RADIOLOGY:  Dg Chest 1 View  Result Date: 12/18/2015 CLINICAL  DATA:  Patient with history of pneumonia. Productive cough. EXAM: CHEST 1 VIEW COMPARISON:  Chest radiograph 12/17/2015 FINDINGS: Monitoring leads overlie the patient. Stable cardiac and mediastinal contours. Interval development of consolidation within the left lower  lung. No pleural effusion or pneumothorax. IMPRESSION: Focal consolidation within the left lower hemi thorax may represent atelectasis or infection. Followup PA and lateral chest X-ray is recommended in 3-4 weeks following trial of antibiotic therapy to ensure resolution and exclude underlying malignancy. Electronically Signed   By: Lovey Newcomer M.D.   On: 12/18/2015 13:41     ASSESSMENT AND PLAN:   65 y.o. female with a known history of Essential hypertension, hypothyroidism, tobacco dependence who presented due to shortness of breath and wheezing.  1. Acute hypoxic respiratory failure with reactive airway disease and bronchospasmAnd continue quite pneumonia: Patient sounds tight this morning so we'll continue current dose of IV steroids  Wean oxygen to room air Continue Rocephin and azithromycin for pneumonia Tussionex for cough Continue inhalers and nebulized treatments Leukocytosis related to pneumonia and steroids Repeat CBC in a.m.  2. Sinus tachycardia: This is due to acute hypoxic respiratory failure and nebulizer treatments. Improved Continue telemetry monitoring Continue Cardizem.  3. Tobacco dependence: Patient highly encourage assessment. Patient counseled 3 minutes by myself.  4. Anxiety: Continue when necessary and Xanax  5. Hypothyroid: Continue Synthroid  6. Essential hypertension: Continue Cardizem and lisinopril   Physical therapy evaluation recommends skilled nursing facility. Critical social worker consult placed.  Management plans discussed with the patient and she is in agreement.  CODE STATUS: full  TOTAL TIME TAKING CARE OF THIS PATIENT: 24 minutes.     POSSIBLE D/C tomorrow, DEPENDING ON CLINICAL CONDITION.   [name] M.D on 12/19/2015 at 12:01 PM  Between 7am to 6pm - Pager - [phone] After 6pm go to [email or web address] - password [email or web address] [facility]  Office  [phone]  CC: Primary care physician; Jill Rake,  MD  Note: This dictation was prepared with Dragon dictation along with smaller phrase technology. Any transcriptional errors that result from this process are unintentional.

## 2015-12-19 NOTE — Plan of Care (Signed)
Problem: Activity: Goal: Risk for activity intolerance will decrease Outcome: Progressing Patient ambulating with walker and one person assist

## 2015-12-19 NOTE — Progress Notes (Signed)
Physical Therapy Treatment Patient Details Name: [name] MRN: [id number] DOB: 23-May-1950 Today's Date: 12/19/2015    History of Present Illness [name]  is a 65 y.o. female with a known history of Essential hypertension, hypothyroidism, anxiety came in because of shortness of breath, wheezing. Patient has a history of heavy tobacco abuse, 50 pack years history. Started to have shortness of breath since  Thursday associated wheezing, cough, fever.  Patient was diagnosed with COPD exacerbation.     PT Comments    Pt appearing a little stronger today compared to yesterday's evaluation but pt demonstrates overall "shakiness" with activity concerning for balance impairments (even with use of RW).  Pt limited in activity d/t SOB and fatigue requiring pacing and rest breaks during session.  Pt's O2 >90% on 3 L/min O2 via nasal cannula during session.  Will continue to progress pt with increasing functional independence and increasing activity tolerance per pt tolerance.   Follow Up Recommendations  SNF     Equipment Recommendations  Rolling walker with 5" wheels    Recommendations for Other Services       Precautions / Restrictions Precautions Precautions: Fall Restrictions Weight Bearing Restrictions: No    Mobility  Bed Mobility Overal bed mobility: Modified Independent             General bed mobility comments: supine to/from sit with HOB elevated; able to scoot up in bed independently  Transfers Overall transfer level: Needs assistance Equipment used: Rolling walker (2 wheeled) Transfers: Sit to/from [facility] Sit to Stand: Min guard Stand pivot transfers: Min guard (toilet transfer)       General transfer comment: pt "shaky" upon standing with use of RW; CGA given for safety  Ambulation/Gait Ambulation/Gait assistance: Min guard Ambulation Distance (Feet): 100 Feet Assistive device: Rolling walker (2 wheeled)   Gait velocity: decreased    General Gait Details: decreased cadence, decreased B step length/foot clearance/heelstrike; wide BOS; trunk flexed; limited distance d/t SOB/fatigue   Stairs            Wheelchair Mobility    Modified Rankin (Stroke Patients Only)       Balance Overall balance assessment: Needs assistance Sitting-balance support: No upper extremity supported;Feet supported Sitting balance-Leahy Scale: Good     Standing balance support: Single extremity supported Standing balance-Leahy Scale: Fair Standing balance comment: hygiene with toileting; single UE support on RW for balance; "shaky" overall with activity                    Cognition Arousal/Alertness: Awake/alert Behavior During Therapy: WFL for tasks assessed/performed Overall Cognitive Status: Within Functional Limits for tasks assessed                      Exercises General Exercises - Lower Extremity Hip ABduction/ADduction: AROM;Strengthening;Both;10 reps;Standing Hip Flexion/Marching: AROM;Strengthening;Both;10 reps;Standing Heel Raises: AROM;Strengthening;Both;10 reps;Standing    General Comments General comments (skin integrity, edema, etc.): Pt laying in bed upon PT arrival.  Nursing cleared pt for participation in physical therapy.  Pt agreeable to PT session.      Pertinent Vitals/Pain Pain Assessment: No/denies pain  See flow sheet for HR and O2 vitals.    Home Living                      Prior Function            PT Goals (current goals can now be found in the care plan section)  Acute Rehab PT Goals Patient Stated Goal: "I want to walk better and get stronger." PT Goal Formulation: With patient Time For Goal Achievement: 01/01/16 Potential to Achieve Goals: Good Progress towards PT goals: Progressing toward goals    Frequency    Min 2X/week      PT Plan Current plan remains appropriate    Co-evaluation             End of Session Equipment Utilized During  Treatment: Gait belt;Oxygen (3 L/min O2 via nasal cannula) Activity Tolerance: Patient limited by fatigue Patient left: in bed;with call bell/phone within reach;with bed alarm set     Time: [id number] PT Time Calculation (min) (ACUTE ONLY): 26 min  Charges:  $Therapeutic Exercise: 8-22 mins $Therapeutic Activity: 8-22 mins                    G CodesLeitha Bleak 01/15/2016, 4:44 PM Leitha Bleak, [place]

## 2015-12-19 NOTE — Progress Notes (Signed)
Pt. Went to sleep around two A.M. C/o cough x1, medicated with decreased effects. Pt. Had c/o anxiety x2, medicated with decreased results. No signs or c/o pain, SOB or acute distress observed. Pt. Ran NSR/ST throughout the night with heart rate increasing to lower teens  With exertion.

## 2015-12-19 NOTE — NC FL2 (Signed)
[place] LEVEL OF CARE SCREENING TOOL     IDENTIFICATION  Patient Name: [name] Birthdate: 1950-09-28 Sex: female Admission Date (Current Location): 12/17/2015  [place] and [place] Number:  Engineering geologist and Address:  [facility], [address], [place], [place] [address]      Provider Number: [id number]  Attending Physician Name and Address:  Bettey Costa, MD  Relative Name and Phone Number:       Current Level of Care: Hospital Recommended Level of Care: [place] Prior Approval Number:    Date Approved/Denied:   PASRR Number:   [id number]   Discharge Plan: SNF    Current Diagnoses: Patient Active Problem List   Diagnosis Date Noted  . COPD exacerbation ([place]) 12/17/2015  . Abdominal pain, acute, epigastric 12/13/2015  . Bilateral carotid artery stenosis 08/22/2015  . Atherosclerotic peripheral vascular disease with intermittent claudication ([place]) 08/04/2015  . Benign essential HTN 08/04/2015  . Mixed hyperlipidemia 08/04/2015  . Shortness of breath 08/04/2015  . Abnormal finding on EKG 07/25/2015  . Abnormal ECG 07/01/2015  . Narrowing of intervertebral disc space 07/01/2015  . Anxiety and depression 07/01/2015  . Dilatation pupillary 07/01/2015  . Gravida 2 para 2 07/01/2015  . Panic disorder without agoraphobia with moderate panic attacks 07/01/2015  . Parity 2 07/01/2015  . Muscle cramps at night 06/24/2015  . Prediabetes 06/09/2015  . Bladder disorder 06/03/2015  . Lichen sclerosus [id number]  . Chronic vulvitis 02/20/2015  . Annual physical exam 02/01/2015  . Abdominal pain, LLQ 02/01/2015  . Productive cough 12/23/2014  . Acute maxillary sinusitis 12/23/2014  . Hypertension 12/09/2014  . Chronic LBP 09/16/2014  . Clinical depression 08/19/2014  . Adult hypothyroidism 08/19/2014  . Anxiety disorder 06/23/2014  . Acquired hypothyroidism 01/13/2014  . Continuous opioid dependence  ([place]) 07/28/2013  . Chronic pain associated with significant psychosocial dysfunction 08/05/2012  . Other long term (current) drug therapy 08/05/2012  . Pain medication agreement signed 08/05/2012  . Infection of urinary tract 05/06/2012  . Acid reflux 07/01/2009  . Bladder retention 10/25/2008  . Lumbar canal stenosis 10/21/2008  . Current tobacco use 09/19/2008  . Cephalalgia 09/02/2008  . Degeneration of intervertebral disc of lumbosacral region 03/22/2008    Orientation RESPIRATION BLADDER Height & Weight     Self, Time, Situation, Place  O2 (2L) Continent Weight: 177 lb 9.6 oz (80.6 kg) Height:  5' 2.5" (158.8 cm)  BEHAVIORAL SYMPTOMS/MOOD NEUROLOGICAL BOWEL NUTRITION STATUS      Continent Diet (regular)  AMBULATORY STATUS COMMUNICATION OF NEEDS Skin   Limited Assist (walking 100 ft) Verbally Normal                       Personal Care Assistance Level of Assistance  Bathing, Feeding, Dressing Bathing Assistance: Limited assistance Feeding assistance: Independent Dressing Assistance: Limited assistance     Functional Limitations Info  Sight, Hearing, Speech Sight Info: Adequate Hearing Info: Adequate Speech Info: Adequate    SPECIAL CARE FACTORS FREQUENCY  PT (By licensed PT), OT (By licensed OT)     PT Frequency: 5x OT Frequency: 5x            Contractures Contractures Info: Not present    Additional Factors Info  Code Status, Allergies Code Status Info: Full Code Allergies Info: Ciprofloxacin, Oxycodone, Oxycodone-acetaminophen, Oxycodone-acetaminophen, Sulfa Antibiotics           Current Medications (12/19/2015):  This is the current hospital active medication  list Current Facility-Administered Medications  Medication Dose Route Frequency Provider Last Rate Last Dose  . acetaminophen (TYLENOL) tablet 650 mg  650 mg Oral Q6H PRN Epifanio Lesches, MD   650 mg at 12/17/15 1835  . ALPRAZolam Duanne Moron) tablet 0.5 mg  0.5 mg Oral TID PRN  Epifanio Lesches, MD   0.5 mg at 12/19/15 1510  . ALPRAZolam Duanne Moron) tablet 1 mg  1 mg Oral QHS PRN Epifanio Lesches, MD   1 mg at 12/18/15 2042  . azithromycin (ZITHROMAX) tablet 500 mg  500 mg Oral q1800 Sital Mody, MD      . bisacodyl (DULCOLAX) EC tablet 5 mg  5 mg Oral Daily PRN Epifanio Lesches, MD      . cefTRIAXone (ROCEPHIN) IVPB 1 g  1 g Intravenous Q24H Sital Mody, MD      . chlorpheniramine-HYDROcodone (TUSSIONEX) 10-8 MG/5ML suspension 5 mL  5 mL Oral Q12H PRN Bettey Costa, MD   5 mL at 12/19/15 1012  . citalopram (CELEXA) tablet 20 mg  20 mg Oral Daily Epifanio Lesches, MD   20 mg at 12/19/15 0929  . diltiazem (CARDIZEM CD) 24 hr capsule 120 mg  120 mg Oral Daily Bettey Costa, MD   120 mg at 12/19/15 0929  . docusate sodium (COLACE) capsule 100 mg  100 mg Oral BID Epifanio Lesches, MD   100 mg at 12/19/15 0929  . enoxaparin (LOVENOX) injection 40 mg  40 mg Subcutaneous Q24H Sital Mody, MD      . guaiFENesin-dextromethorphan (ROBITUSSIN DM) 100-10 MG/5ML syrup 5 mL  5 mL Oral Q4H PRN Harrie Foreman, MD   5 mL at 12/19/15 1300  . levalbuterol (XOPENEX) nebulizer solution 1.25 mg  1.25 mg Nebulization Q6H WA Sital Mody, MD   1.25 mg at 12/19/15 1435  . levothyroxine (SYNTHROID, LEVOTHROID) tablet 100 mcg  100 mcg Oral Q breakfast Epifanio Lesches, MD   100 mcg at 12/19/15 0739  . lisinopril (PRINIVIL,ZESTRIL) tablet 5 mg  5 mg Oral q morning - 10a Epifanio Lesches, MD   5 mg at 12/19/15 0929  . methylPREDNISolone sodium succinate (SOLU-MEDROL) 40 mg/mL injection 40 mg  40 mg Intravenous Q12H Sital Mody, MD   40 mg at 12/19/15 1108  . ondansetron (ZOFRAN) tablet 4 mg  4 mg Oral Q6H PRN Epifanio Lesches, MD       Or  . ondansetron (ZOFRAN) injection 4 mg  4 mg Intravenous Q6H PRN Epifanio Lesches, MD   4 mg at 12/18/15 1849  . pantoprazole (PROTONIX) EC tablet 40 mg  40 mg Oral Daily Epifanio Lesches, MD   40 mg at 12/19/15 0929  . pravastatin (PRAVACHOL)  tablet 20 mg  20 mg Oral q1800 Epifanio Lesches, MD   20 mg at 12/18/15 1702  . traZODone (DESYREL) tablet 25 mg  25 mg Oral QHS PRN Epifanio Lesches, MD         Discharge Medications: Please see discharge summary for a list of discharge medications.  Relevant Imaging Results:  Relevant Lab Results:   Additional Information SSN:  [id number]  Lilly Cove, [place]

## 2015-12-19 NOTE — Care Management (Signed)
Patient admitted with exac of COPD.  Her current 02 requirements are acute.  She lives alone and prior to this presentation, she was independent in all her adls. Says she is current with her pcp Dr Manuella Ghazi at [facility].  Physical therapy has recommended snf and patient is agreeable.  CSW is initiating a bed search

## 2015-12-19 NOTE — Progress Notes (Signed)
Per Valeta Harms, Infection Prevention, patient has a history of MRSA. Will order MRSA PCR screen.

## 2015-12-19 NOTE — Clinical Social Work Placement (Signed)
   CLINICAL SOCIAL WORK PLACEMENT  NOTE  Date:  12/19/2015  Patient Details  Name: [name] MRN: [id number] Date of Birth: [birth year]  Clinical Social Work is seeking post-discharge placement for this patient at the [place] level of care (*CSW will initial, date and re-position this form in  chart as items are completed):  Yes   Patient/family provided with [facility] Work Department's list of facilities offering this level of care within the geographic area requested by the patient (or if unable, by the patient's family).  Yes   Patient/family informed of their freedom to choose among providers that offer the needed level of care, that participate in Medicare, Medicaid or managed care program needed by the patient, have an available bed and are willing to accept the patient.  Yes   Patient/family informed of [facility]'s ownership interest in [facility] and [facility], as well as of the fact that they are under no obligation to receive care at these facilities.  PASRR submitted to EDS on 12/19/15     PASRR number received on 12/19/15     Existing PASRR number confirmed on       FL2 transmitted to all facilities in geographic area requested by pt/family on 12/19/15     FL2 transmitted to all facilities within larger geographic area on       Patient informed that his/her managed care company has contracts with or will negotiate with certain facilities, including the following:            Patient/family informed of bed offers received.  Patient chooses bed at       Physician recommends and patient chooses bed at      Patient to be transferred to   on  .  Patient to be transferred to facility by       Patient family notified on   of transfer.  Name of family member notified:        PHYSICIAN Please sign FL2     Additional Comment:    _______________________________________________ Lilly Cove, LCSW 12/19/2015, 3:38  PM

## 2015-12-19 NOTE — Clinical Social Work Note (Signed)
Clinical Social Work Assessment  Patient Details  Name: [name] MRN: [id number] Date of Birth: [birth year]  Date of referral:  12/19/15               Reason for consult:  Facility Placement, Discharge Planning                Permission sought to share information with:  Case Manager, Customer service manager, Family Supports Permission granted to share information::  Yes, Verbal Permission Granted  Name::        Agency::  SNF in [place]  Relationship::  Son  Contact Information:     Housing/Transportation Living arrangements for the past 2 months:  [place] of Information:  Patient, Medical Team, Case Manager Patient Interpreter Needed:  None Criminal Activity/Legal Involvement Pertinent to Current Situation/Hospitalization:  No - Comment as needed Significant Relationships:  Adult Children, Other Family Members Lives with:  Self Do you feel safe going back to the place where you live?  No Need for family participation in patient care:  No (Coment)  Care giving concerns:  Patient reports she lives alone, but her son and daughter live near by and they talk frequently. She reports she has concerns with her safety returning home as she is now on 02 and not previously, lives alone and just does not feel strong enough to return.  Patient is agreeable to ST rehab.   Social Worker assessment / plan:  Patient is a consult for ST SNF and LCSW completed work up and assessment. Patient would like placement [place] for ST, preferably not [facility] as she was an employee at facility for 5 years. LCSW explained process and services that SW can offer while in hospital.  Will follow up with bed offers.  Plan: ST SNF at [place]  Employment status:  Retired Nurse, adult PT Recommendations:  [place] / Referral to community resources:  [place]  Patient/Family's Response to care:   Agreeable to plan  Patient/Family's Understanding of and Emotional Response to Diagnosis, Current Treatment, and Prognosis: Patient very rationale and understanding of her current limitations and treatment recommendations.  She is hopeful SNF will improve her functionality in the home.  Emotional Assessment Appearance:  Appears stated age Attitude/Demeanor/Rapport:  Other (cooperative and engaged) Affect (typically observed):  Accepting, Adaptable, Hopeful Orientation:  Oriented to Self, Oriented to Place, Oriented to  Time, Oriented to Situation Alcohol / Substance use:  Not Applicable Psych involvement (Current and /or in the community):  No (Comment)  Discharge Needs  Concerns to be addressed:  No discharge needs identified Readmission within the last 30 days:  No Current discharge risk:  None Barriers to Discharge:  No Barriers Identified, Continued Medical Work up   Lilly Cove, LCSW 12/19/2015, 3:34 PM

## 2015-12-19 NOTE — Progress Notes (Signed)
Pt. Has c/o cough, MD paged awaiting call back for additional cough medication.

## 2015-12-20 ENCOUNTER — Encounter
Admission: RE | Admit: 2015-12-20 | Discharge: 2015-12-20 | Disposition: A | Discharge status: Home / Self Care | Payer: PPO | Source: Ambulatory Visit | Attending: Internal Medicine | Admitting: Internal Medicine

## 2015-12-20 DIAGNOSIS — M6281 Muscle weakness (generalized): Secondary | ICD-10-CM | POA: Diagnosis not present

## 2015-12-20 DIAGNOSIS — Z9989 Dependence on other enabling machines and devices: Secondary | ICD-10-CM | POA: Diagnosis not present

## 2015-12-20 DIAGNOSIS — E039 Hypothyroidism, unspecified: Secondary | ICD-10-CM | POA: Diagnosis not present

## 2015-12-20 DIAGNOSIS — R6889 Other general symptoms and signs: Secondary | ICD-10-CM | POA: Diagnosis not present

## 2015-12-20 DIAGNOSIS — K219 Gastro-esophageal reflux disease without esophagitis: Secondary | ICD-10-CM | POA: Diagnosis not present

## 2015-12-20 DIAGNOSIS — J441 Chronic obstructive pulmonary disease with (acute) exacerbation: Secondary | ICD-10-CM | POA: Diagnosis not present

## 2015-12-20 DIAGNOSIS — J440 Chronic obstructive pulmonary disease with acute lower respiratory infection: Secondary | ICD-10-CM | POA: Diagnosis not present

## 2015-12-20 DIAGNOSIS — I10 Essential (primary) hypertension: Secondary | ICD-10-CM | POA: Diagnosis not present

## 2015-12-20 DIAGNOSIS — L4050 Arthropathic psoriasis, unspecified: Secondary | ICD-10-CM | POA: Diagnosis not present

## 2015-12-20 DIAGNOSIS — J9601 Acute respiratory failure with hypoxia: Secondary | ICD-10-CM | POA: Diagnosis not present

## 2015-12-20 DIAGNOSIS — F410 Panic disorder [episodic paroxysmal anxiety] without agoraphobia: Secondary | ICD-10-CM | POA: Diagnosis not present

## 2015-12-20 DIAGNOSIS — Z720 Tobacco use: Secondary | ICD-10-CM | POA: Diagnosis not present

## 2015-12-20 DIAGNOSIS — J189 Pneumonia, unspecified organism: Secondary | ICD-10-CM | POA: Diagnosis not present

## 2015-12-20 DIAGNOSIS — R262 Difficulty in walking, not elsewhere classified: Secondary | ICD-10-CM | POA: Diagnosis not present

## 2015-12-20 DIAGNOSIS — F3289 Other specified depressive episodes: Secondary | ICD-10-CM | POA: Diagnosis not present

## 2015-12-20 DIAGNOSIS — E785 Hyperlipidemia, unspecified: Secondary | ICD-10-CM | POA: Diagnosis not present

## 2015-12-20 LAB — CBC
HCT: 41 % (ref 35.0–47.0)
Hemoglobin: 13.5 g/dL (ref 12.0–16.0)
MCH: 30.5 pg (ref 26.0–34.0)
MCHC: 32.9 g/dL (ref 32.0–36.0)
MCV: 92.5 fL (ref 80.0–100.0)
PLATELETS: 218 10*3/uL (ref 150–440)
RBC: 4.43 MIL/uL (ref 3.80–5.20)
RDW: 13.7 % (ref 11.5–14.5)
WBC: 17.3 10*3/uL — AB (ref 3.6–11.0)

## 2015-12-20 LAB — GLUCOSE, CAPILLARY: GLUCOSE-CAPILLARY: 165 mg/dL — AB (ref 65–99)

## 2015-12-20 MED ORDER — AMOXICILLIN-POT CLAVULANATE 875-125 MG PO TABS: 1.0000 | ORAL_TABLET | Freq: Two times a day (BID) | ORAL | 0 refills | Status: DC

## 2015-12-20 MED ORDER — GUAIFENESIN-DM 100-10 MG/5ML PO SYRP: 5.0000 mL | ORAL_SOLUTION | ORAL | 0 refills | Status: DC | PRN

## 2015-12-20 MED ORDER — ALPRAZOLAM 1 MG PO TABS: 1.0000 mg | ORAL_TABLET | Freq: Every evening | ORAL | 0 refills | Status: DC | PRN

## 2015-12-20 MED ORDER — DILTIAZEM HCL ER COATED BEADS 120 MG PO CP24: 120.0000 mg | ORAL_CAPSULE | Freq: Every day | ORAL | 0 refills | Status: DC

## 2015-12-20 MED ORDER — ALPRAZOLAM 0.5 MG PO TABS: 0.5000 mg | ORAL_TABLET | Freq: Three times a day (TID) | ORAL | 0 refills | Status: DC | PRN

## 2015-12-20 MED ORDER — LEVALBUTEROL HCL 1.25 MG/0.5ML IN NEBU: 1.2500 mg | INHALATION_SOLUTION | Freq: Four times a day (QID) | RESPIRATORY_TRACT | 12 refills | Status: DC

## 2015-12-20 NOTE — Care Management Important Message (Signed)
Important Message  Patient Details  Name: [name] MRN: [id number] Date of Birth: 05/18/1950   Medicare Important Message Given:  Yes    Katrina Stack, RN 12/20/2015, 11:41 AM

## 2015-12-20 NOTE — Discharge Summary (Signed)
[facility] NAME: [name]    MR#:  [id number]  DATE OF BIRTH:  September 11, 1950  DATE OF ADMISSION:  12/17/2015 ADMITTING PHYSICIAN: Epifanio Lesches, MD  DATE OF DISCHARGE: 12/20/2015  PRIMARY CARE PHYSICIAN: Keith Rake, MD    ADMISSION DIAGNOSIS:  COPD exacerbation ([place]) [J44.1] Acute respiratory failure with hypoxia ([place]) [J96.01]  DISCHARGE DIAGNOSIS:  Active Problems:   COPD exacerbation ([place])   SECONDARY DIAGNOSIS:   Past Medical History:  Diagnosis Date  . Anxiety   . Arthritis   . Chronic back pain   . Depression   . GERD (gastroesophageal reflux disease)   . Hypertension   . Hypothyroidism   . Pneumonia    RECENT X 3  . PONV (postoperative nausea and vomiting)   . Psoriasis   . Thyroid disease     HOSPITAL COURSE:   65 y.o.femalewith a known history of Essential hypertension, hypothyroidism, tobacco dependence who presented due to shortness of breath and wheezing.  1. Acute hypoxic respiratory failure with reactive airway disease and bronchospasm and continue CAP: Patient responded to Rocephin and azithromycin for pneumonia and steroids for bronchospasm Continue nebulized treatments and steroid taper at discharge with AUGMENTIN. Leukocytosis related to pneumonia and steroids which has improved   2. Sinus tachycardia: This is due to acute hypoxic respiratory failure and nebulizer treatments. Improved Continue Cardizem.  3. Tobacco dependence: Patient highly encourage to stop smoking.Marland Kitchen She was counseled 3 minutes by myself.  4. Anxiety: Continue PRN Xanax  5. Hypothyroid: Continue Synthroid  6. Essential hypertension: Continue Cardizem and lisinopril  DISCHARGE CONDITIONS AND DIET:   Stable Heart healthy  CONSULTS OBTAINED:    DRUG ALLERGIES:   Allergies  Allergen Reactions  . Ciprofloxacin Hives and Diarrhea  . Oxycodone Diarrhea  . Oxycodone-Acetaminophen Diarrhea  .  Oxycodone-Acetaminophen Diarrhea  . Sulfa Antibiotics Rash    Throat, mouth Throat, mouth    DISCHARGE MEDICATIONS:   Current Discharge Medication List    START taking these medications   Details  albuterol (PROVENTIL HFA;VENTOLIN HFA) 108 (90 Base) MCG/ACT inhaler Inhale 2 puffs into the lungs every 6 (six) hours as needed for wheezing or shortness of breath. Qty: 1 Inhaler, Refills: 2    amoxicillin-clavulanate (AUGMENTIN) 875-125 MG tablet Take 1 tablet by mouth 2 (two) times daily. Qty: 12 tablet, Refills: 0    diltiazem (CARDIZEM CD) 120 MG 24 hr capsule Take 1 capsule (120 mg total) by mouth daily. Qty: 30 capsule, Refills: 0    guaiFENesin-dextromethorphan (ROBITUSSIN DM) 100-10 MG/5ML syrup Take 5 mLs by mouth every 4 (four) hours as needed for cough. Qty: 118 mL, Refills: 0    levalbuterol (XOPENEX) 1.25 MG/0.5ML nebulizer solution Take 1.25 mg by nebulization every 6 (six) hours. Qty: 1 each, Refills: 12    predniSONE (DELTASONE) 20 MG tablet Take 2 tablets (40 mg total) by mouth daily. Qty: 8 tablet, Refills: 0      CONTINUE these medications which have CHANGED   Details  !! ALPRAZolam (XANAX) 0.5 MG tablet Take 1 tablet (0.5 mg total) by mouth 3 (three) times daily as needed for anxiety. Qty: 40 tablet, Refills: 0   Associated Diagnoses: Panic disorder without agoraphobia    !! ALPRAZolam (XANAX) 1 MG tablet Take 1 tablet (1 mg total) by mouth at bedtime as needed for anxiety. Qty: 20 tablet, Refills: 0   Associated Diagnoses: Panic disorder without agoraphobia     !! - Potential duplicate medications found.  Please discuss with provider.    CONTINUE these medications which have NOT CHANGED   Details  acetaminophen (TYLENOL) 500 MG tablet Take 500 mg by mouth every 6 (six) hours as needed for moderate pain. Reported on 07/01/2015    citalopram (CELEXA) 20 MG tablet Take 1 tablet (20 mg total) by mouth daily. Qty: 90 tablet, Refills: 0   Associated Diagnoses:  Clinical depression    lisinopril (PRINIVIL,ZESTRIL) 10 MG tablet Take 0.5 tablets (5 mg total) by mouth every morning. Take 5 mg every morning. Qty: 90 tablet, Refills: 0   Associated Diagnoses: Benign hypertension    omeprazole (PRILOSEC) 20 MG capsule Take 1 capsule (20 mg total) by mouth daily. Qty: 30 capsule, Refills: 0   Associated Diagnoses: Abdominal pain, acute, epigastric    pravastatin (PRAVACHOL) 20 MG tablet Take by mouth.    Secukinumab (COSENTYX [place]) Inject into the skin every 30 (thirty) days. 2 injections per month    SYNTHROID 100 MCG tablet TAKE ONE TABLET BY MOUTH EVERY MORNING BEFORE BREAKFAST Qty: 90 tablet, Refills: 1   Associated Diagnoses: Hypothyroidism, unspecified hypothyroidism type      STOP taking these medications     amLODipine (NORVASC) 5 MG tablet      clobetasol ointment (TEMOVATE) 0.05 %               Today   CHIEF COMPLAINT:  Doing better this am   VITAL SIGNS:  Blood pressure (!) 141/78, pulse (!) 110, temperature 97.3 F (36.3 C), temperature source Oral, resp. rate 18, height 5' 2.5" (1.588 m), weight 78.4 kg (172 lb 14.4 oz), SpO2 92 %.   REVIEW OF SYSTEMS:  Review of Systems  Constitutional: Negative for chills, fever and malaise/fatigue.  HENT: Negative.  Negative for ear discharge, ear pain, hearing loss, nosebleeds and sore throat.   Eyes: Negative.  Negative for blurred vision and pain.  Respiratory: Positive for cough and wheezing. Negative for hemoptysis and shortness of breath.   Cardiovascular: Negative.  Negative for chest pain, palpitations and leg swelling.  Gastrointestinal: Negative.  Negative for abdominal pain, blood in stool, diarrhea, nausea and vomiting.  Genitourinary: Negative.  Negative for dysuria.  Musculoskeletal: Negative.  Negative for back pain.  Skin: Negative.   Neurological: Positive for weakness. Negative for dizziness, tremors, speech change, focal weakness, seizures and headaches.   Endo/Heme/Allergies: Negative.  Does not bruise/bleed easily.  Psychiatric/Behavioral: Negative.  Negative for depression, hallucinations and suicidal ideas.     PHYSICAL EXAMINATION:  GENERAL:  65 y.o.-year-old patient lying in the bed with no acute distress.  NECK:  Supple, no jugular venous distention. No thyroid enlargement, no tenderness.  LUNGS: good air flow with residual b/l wheezing L>R NO rales,rhonchi  No use of accessory muscles of respiration.  CARDIOVASCULAR: S1, S2 normal. No murmurs, rubs, or gallops.  ABDOMEN: Soft, non-tender, non-distended. Bowel sounds present. No organomegaly or mass.  EXTREMITIES: No pedal edema, cyanosis, or clubbing.  PSYCHIATRIC: The patient is alert and oriented x 3.  SKIN: No obvious rash, lesion, or ulcer.   DATA REVIEW:   CBC  Recent Labs Lab 12/20/15 0536  WBC 17.3*  HGB 13.5  HCT 41.0  PLT 218    Chemistries   Recent Labs Lab 12/13/15 1028  12/18/15 0601  NA 142  < > 139  K 4.5  < > 3.9  CL 103  < > 101  CO2 32*  < > 29  GLUCOSE 110*  < > 196*  BUN 9  < >  13  CREATININE 0.85  < > 1.00  CALCIUM 9.5  < > 9.4  AST 20  --   --   ALT 13  --   --   ALKPHOS 77  --   --   BILITOT 0.4  --   --   < > = values in this interval not displayed.  Cardiac Enzymes  Recent Labs Lab 12/17/15 0936  TROPONINI <0.03    Microbiology Results  @[email or web address] @  RADIOLOGY:  Dg Chest 1 View  Result Date: 12/18/2015 CLINICAL DATA:  Patient with history of pneumonia. Productive cough. EXAM: CHEST 1 VIEW COMPARISON:  Chest radiograph 12/17/2015 FINDINGS: Monitoring leads overlie the patient. Stable cardiac and mediastinal contours. Interval development of consolidation within the left lower lung. No pleural effusion or pneumothorax. IMPRESSION: Focal consolidation within the left lower hemi thorax may represent atelectasis or infection. Followup PA and lateral chest X-ray is recommended in 3-4 weeks following trial of antibiotic therapy  to ensure resolution and exclude underlying malignancy. Electronically Signed   By: Lovey Newcomer M.D.   On: 12/18/2015 13:41      Management plans discussed with the patient and she is in agreement. Stable for discharge SNF  Patient should follow up with pcp  CODE STATUS:     Code Status Orders        Start     Ordered   12/17/15 1413  Full code  Continuous     12/17/15 1414    Code Status History    Date Active Date Inactive Code Status Order ID Comments User Context   This patient has a current code status but no historical code status.      TOTAL TIME TAKING CARE OF THIS PATIENT: 36 minutes.    Note: This dictation was prepared with Dragon dictation along with smaller phrase technology. Any transcriptional errors that result from this process are unintentional.  Lakima Dona M.D on 12/20/2015 at 8:01 AM  Between 7am to 6pm - Pager - [phone] After 6pm go to [email or web address] - password [email or web address] [facility]  Office  [phone]  CC: Primary care physician; Keith Rake, MD

## 2015-12-20 NOTE — Progress Notes (Signed)
Packet prepared by CSW, [employer]. Report called to [place] RN at facility. EMS called for transport. IV and tele removed prior to leaving.

## 2015-12-20 NOTE — Clinical Social Work Placement (Signed)
   CLINICAL SOCIAL WORK PLACEMENT  NOTE  Date:  12/20/2015  Patient Details  Name: [name] MRN: [id number] Date of Birth: 1950-10-26  Clinical Social Work is seeking post-discharge placement for this patient at the [place] level of care (*CSW will initial, date and re-position this form in  chart as items are completed):  Yes   Patient/family provided with [facility] Work Department's list of facilities offering this level of care within the geographic area requested by the patient (or if unable, by the patient's family).  Yes   Patient/family informed of their freedom to choose among providers that offer the needed level of care, that participate in Medicare, Medicaid or managed care program needed by the patient, have an available bed and are willing to accept the patient.  Yes   Patient/family informed of [facility]'s ownership interest in [facility] and [facility], as well as of the fact that they are under no obligation to receive care at these facilities.  PASRR submitted to EDS on 12/19/15     PASRR number received on 12/19/15     Existing PASRR number confirmed on       FL2 transmitted to all facilities in geographic area requested by pt/family on 12/19/15     FL2 transmitted to all facilities within larger geographic area on       Patient informed that his/her managed care company has contracts with or will negotiate with certain facilities, including the following:            Patient/family informed of bed offers received.  12/20/2015   Patient chooses bed at     [facility]  Physician recommends and patient chooses bed at      [facility] Patient to be transferred to   on  .  12/20/2015   Patient to be transferred to facility by     EMS  Patient family notified on   of transfer. Family, daughter  Name of family member notified:      Patient will notify daughter as she will bring her  clothing.  PHYSICIAN Please sign FL2     Additional Comment:    _______________________________________________ Lilly Cove, LCSW 12/20/2015, 9:47 AM

## 2015-12-20 NOTE — Care Management Important Message (Signed)
Important Message  Patient Details  Name: [name] MRN: [id number] Date of Birth: 18-Nov-1950   Medicare Important Message Given:       Katrina Stack, RN 12/20/2015, 11:32 AM

## 2015-12-20 NOTE — Progress Notes (Signed)
Pt. Went to sleep around 0130. C/O anxiety x2 medicated with decreased effects, cough x2 medicated with decreased results. No signs or c/o pain or acute distress noted.

## 2015-12-20 NOTE — Progress Notes (Addendum)
LCSW presented bed offers to patient and she has chosen [employer] LCSW notified SNF with regards to choice and they are agreeable to plan.  Patient will transport by EMS. Patient's family to see her at facility and daughter to bring clothing.  LCSW has contacted [employer] for insurance authorization and can send patient once obtained.  Will continue to complete discharge. Plan: SNF [facility]. Barrier: Tax inspector.  (resolved:  [id number] authorization for SNF)  Patient to transport by EMS. Patient has made family aware.   Lane Hacker, MSW Clinical Social Work: [employer] Coverage for :  [phone]

## 2015-12-21 DIAGNOSIS — J449 Chronic obstructive pulmonary disease, unspecified: Secondary | ICD-10-CM | POA: Diagnosis not present

## 2015-12-21 DIAGNOSIS — J189 Pneumonia, unspecified organism: Secondary | ICD-10-CM | POA: Diagnosis not present

## 2015-12-21 DIAGNOSIS — F419 Anxiety disorder, unspecified: Secondary | ICD-10-CM | POA: Diagnosis not present

## 2015-12-21 DIAGNOSIS — I10 Essential (primary) hypertension: Secondary | ICD-10-CM | POA: Diagnosis not present

## 2015-12-27 ENCOUNTER — Ambulatory Visit: Payer: PPO | Attending: Family Medicine

## 2015-12-28 ENCOUNTER — Ambulatory Visit: Payer: Self-pay | Admitting: Family Medicine

## 2016-01-02 ENCOUNTER — Ambulatory Visit: Payer: PPO | Admitting: Gastroenterology

## 2016-01-02 ENCOUNTER — Non-Acute Institutional Stay (SKILLED_NURSING_FACILITY): Payer: PPO | Admitting: Gerontology

## 2016-01-02 DIAGNOSIS — J441 Chronic obstructive pulmonary disease with (acute) exacerbation: Secondary | ICD-10-CM

## 2016-01-02 DIAGNOSIS — F410 Panic disorder [episodic paroxysmal anxiety] without agoraphobia: Secondary | ICD-10-CM

## 2016-01-04 ENCOUNTER — Other Ambulatory Visit: Payer: Self-pay | Admitting: Family Medicine

## 2016-01-04 DIAGNOSIS — E039 Hypothyroidism, unspecified: Secondary | ICD-10-CM

## 2016-01-04 DIAGNOSIS — I10 Essential (primary) hypertension: Secondary | ICD-10-CM

## 2016-01-05 NOTE — Progress Notes (Signed)
Location:      Place of Service:  SNF (31) Provider:  Toni Arthurs, NP-C  Keith Rake, MD  Patient Care Team: Roselee Nova, MD as PCP - [facility])  Extended Emergency Contact Information Primary Emergency Contact: Sydnee Cabal of [place] Phone: [phone] Relation: Son Secondary Emergency Contact: Wyonia Hough Address: [place], [place] [id number] Johnnette Litter of [place] Phone: [phone] Work Phone: [phone] Relation: Sister  Code Status:  full Goals of care: Advanced Directive information Advanced Directives 12/17/2015  Does Patient Have a Medical Advance Directive? No  Would patient like information on creating a medical advance directive? No - patient declined information     Chief Complaint  Patient presents with  . Follow-up    HPI:  Pt is a 65 y.o. female seen today for medical management of chronic diseases. She was admitted to the facility for rehabilitation following a short stay in the hospital for COPD exacerbation. Pt completed the course of augmentin and a prednisone taper. She participated in PT and OT for strengthening. Pt is ambulating independently and reports she is feeling much better. She is excited to be going home soon. Pt reports fair appetite, but eating well. VSS. No other complaints.    Past Medical History:  Diagnosis Date  . Anxiety   . Arthritis   . Chronic back pain   . Depression   . GERD (gastroesophageal reflux disease)   . Hypertension   . Hypothyroidism   . Pneumonia    RECENT X 3  . PONV (postoperative nausea and vomiting)   . Psoriasis   . Thyroid disease    Past Surgical History:  Procedure Laterality Date  . ABDOMINAL HYSTERECTOMY     heavy bleeding  . bladder stimulator    . CARPAL TUNNEL RELEASE    . CATARACT EXTRACTION W/PHACO Right 03/07/2015   Procedure: CATARACT EXTRACTION PHACO AND INTRAOCULAR LENS PLACEMENT (IOC);  Surgeon:  Estill Cotta, MD;  Location: [facility] ORS;  Service: Ophthalmology;  Laterality: Right;  [name] 01:14 AP% 26.4 CDE 33.04 fluid pack lot # [id number]  . CATARACT EXTRACTION W/PHACO Left 03/21/2015   Procedure: CATARACT EXTRACTION PHACO AND INTRAOCULAR LENS PLACEMENT (IOC);  Surgeon: Estill Cotta, MD;  Location: [facility] ORS;  Service: Ophthalmology;  Laterality: Left;  [name] 01:21 AP% 25.6 CDE 39.78 fluid pack lot # [id number]  . HAND SURGERY Right   . INTERSTIM IMPLANT REMOVAL N/A 09/07/2015   Procedure: REMOVAL OF INTERSTIM IMPLANT;  Surgeon: Bjorn Loser, MD;  Location: [facility] ORS;  Service: Urology;  Laterality: N/A;  . OOPHORECTOMY Right    ovarian cyst    Allergies  Allergen Reactions  . Ciprofloxacin Hives and Diarrhea  . Oxycodone Diarrhea  . Oxycodone-Acetaminophen Diarrhea  . Oxycodone-Acetaminophen Diarrhea  . Sulfa Antibiotics Rash    Throat, mouth Throat, mouth      Medication List       Accurate as of 01/02/16 11:59 PM. Always use your most recent med list.          acetaminophen 500 MG tablet Commonly known as:  TYLENOL Take 500 mg by mouth every 6 (six) hours as needed for moderate pain. Reported on 07/01/2015   albuterol 108 (90 Base) MCG/ACT inhaler Commonly known as:  PROVENTIL HFA;VENTOLIN HFA Inhale 2 puffs into the lungs every 6 (six) hours as needed for wheezing or shortness of breath.   ALPRAZolam 0.5 MG tablet Commonly known  as:  XANAX Take 1 tablet (0.5 mg total) by mouth 3 (three) times daily as needed for anxiety.   ALPRAZolam 1 MG tablet Commonly known as:  XANAX Take 1 tablet (1 mg total) by mouth at bedtime as needed for anxiety.   amoxicillin-clavulanate 875-125 MG tablet Commonly known as:  AUGMENTIN Take 1 tablet by mouth 2 (two) times daily.   citalopram 20 MG tablet Commonly known as:  CELEXA Take 1 tablet (20 mg total) by mouth daily.   COSENTYX [place] Inject into the skin every 30 (thirty) days. 2 injections per month   diltiazem  120 MG 24 hr capsule Commonly known as:  CARDIZEM CD Take 1 capsule (120 mg total) by mouth daily.   guaiFENesin-dextromethorphan 100-10 MG/5ML syrup Commonly known as:  ROBITUSSIN DM Take 5 mLs by mouth every 4 (four) hours as needed for cough.   levalbuterol 1.25 MG/0.5ML nebulizer solution Commonly known as:  XOPENEX Take 1.25 mg by nebulization every 6 (six) hours.   lisinopril 10 MG tablet Commonly known as:  PRINIVIL,ZESTRIL Take 0.5 tablets (5 mg total) by mouth every morning. Take 5 mg every morning.   omeprazole 20 MG capsule Commonly known as:  PRILOSEC Take 1 capsule (20 mg total) by mouth daily.   pravastatin 20 MG tablet Commonly known as:  PRAVACHOL Take by mouth.   SYNTHROID 100 MCG tablet Generic drug:  levothyroxine TAKE ONE TABLET BY MOUTH EVERY MORNING BEFORE BREAKFAST       Review of Systems  Constitutional: Negative for activity change, appetite change, chills, diaphoresis and fever.  HENT: Negative for congestion, sneezing, sore throat, trouble swallowing and voice change.   Eyes: Negative.   Respiratory: Negative for apnea, cough, choking, chest tightness, shortness of breath and wheezing.   Cardiovascular: Negative for chest pain, palpitations and leg swelling.  Gastrointestinal: Negative for abdominal distention, abdominal pain, constipation, diarrhea and nausea.  Genitourinary: Negative for difficulty urinating, dysuria, frequency and urgency.  Musculoskeletal: Negative for back pain, gait problem and myalgias. Arthralgias: typical arthritis.  Skin: Negative for color change, pallor, rash and wound.  Neurological: Negative for dizziness, tremors, syncope, speech difficulty, weakness, numbness and headaches.  Hematological: Negative.   Psychiatric/Behavioral: Negative.   All other systems reviewed and are negative.   Immunization History  Administered Date(s) Administered  . Influenza, High Dose Seasonal PF 01/07/2015, 11/14/2015  .  Influenza-Unspecified 10/06/2013   Pertinent  Health Maintenance Due  Topic Date Due  . MAMMOGRAM  02/27/1999  . COLONOSCOPY  02/27/1999  . DEXA SCAN  02/26/2014  . PNA vac Low Risk Adult (1 of 2 - PCV13) 02/26/2014  . INFLUENZA VACCINE  Completed   Fall Risk  12/13/2015 11/14/2015 10/14/2015 09/15/2015 08/16/2015  Falls in the past year? No No No No No   Functional Status Survey:    Vitals:   01/02/16 0700  BP: 109/62  Pulse: 71  Resp: 18  Temp: 97.8 F (36.6 C)  SpO2: 96%  Weight: 176 lb 9.6 oz (80.1 kg)   Body mass index is 31.79 kg/m. Physical Exam  Constitutional: She is oriented to person, place, and time. Vital signs are normal. She appears well-developed and well-nourished. She is active and cooperative. She does not appear ill. No distress.  HENT:  Head: Normocephalic and atraumatic.  Mouth/Throat: Uvula is midline, oropharynx is clear and moist and mucous membranes are normal. Mucous membranes are not pale, not dry and not cyanotic.  Eyes: Conjunctivae, EOM and lids are normal. Pupils are equal, round,  and reactive to light.  Neck: Trachea normal, normal range of motion and full passive range of motion without pain. Neck supple. No JVD present. No tracheal deviation, no edema and no erythema present. No thyromegaly present.  Cardiovascular: Normal rate, regular rhythm, normal heart sounds, intact distal pulses and normal pulses.  Exam reveals no gallop, no distant heart sounds and no friction rub.   No murmur heard. Pulmonary/Chest: Effort normal and breath sounds normal. No accessory muscle usage. No respiratory distress. She has no wheezes. She has no rales. She exhibits no tenderness.  Abdominal: Normal appearance and bowel sounds are normal. She exhibits no distension and no ascites. There is no tenderness.  Musculoskeletal: Normal range of motion. She exhibits no edema or tenderness.  Expected osteoarthritis, stiffness  Neurological: She is alert and oriented to  person, place, and time. She has normal strength.  Skin: Skin is warm, dry and intact. No rash noted. She is not diaphoretic. No cyanosis or erythema. No pallor. Nails show no clubbing.  Psychiatric: She has a normal mood and affect. Her speech is normal and behavior is normal. Judgment and thought content normal. Cognition and memory are normal.  Nursing note and vitals reviewed.   Labs reviewed:  Recent Labs  12/13/15 1028 12/17/15 0936 12/18/15 0601  NA 142 136 139  K 4.5 3.6 3.9  CL 103 99* 101  CO2 32* 30 29  GLUCOSE 110* 171* 196*  BUN 9 9 13   CREATININE 0.85 0.83 1.00  CALCIUM 9.5 9.0 9.4    Recent Labs  02/03/15 1022 06/24/15 1102 12/13/15 1028  AST 22 18 20   ALT 21 18 13   ALKPHOS 111 117 77  BILITOT 0.4 0.4 0.4  PROT 7.0 7.0 6.4  ALBUMIN 4.2 4.4 3.9    Recent Labs  02/03/15 1022 06/24/15 1102 12/13/15 1028  12/18/15 0601 12/19/15 0510 12/20/15 0536  WBC 7.3 7.4 7.8  < > 22.7* 24.3* 17.3*  NEUTROABS 3.3 3.9 4,446  --   --   --   --   HGB  --   --  14.7  < > 14.2 14.4 13.5  HCT 45.1 46.6 45.3*  < > 43.7 43.4 41.0  MCV 92 93 93.8  < > 92.2 92.3 92.5  PLT 277 307 240  < > 234 235 218  < > = values in this interval not displayed. Lab Results  Component Value Date   TSH 1.940 02/03/2015   Lab Results  Component Value Date   HGBA1C 6.0 (H) 05/12/2015   Lab Results  Component Value Date   CHOL 205 (H) 02/03/2015   HDL 36 (L) 02/03/2015   LDLCALC 123 (H) 02/03/2015   TRIG 232 (H) 02/03/2015   CHOLHDL 5.7 (H) 02/03/2015    Significant Diagnostic Results in last 30 days:  Dg Chest 1 View  Result Date: 12/18/2015 CLINICAL DATA:  Patient with history of pneumonia. Productive cough. EXAM: CHEST 1 VIEW COMPARISON:  Chest radiograph 12/17/2015 FINDINGS: Monitoring leads overlie the patient. Stable cardiac and mediastinal contours. Interval development of consolidation within the left lower lung. No pleural effusion or pneumothorax. IMPRESSION: Focal  consolidation within the left lower hemi thorax may represent atelectasis or infection. Followup PA and lateral chest X-ray is recommended in 3-4 weeks following trial of antibiotic therapy to ensure resolution and exclude underlying malignancy. Electronically Signed   By: Lovey Newcomer M.D.   On: 12/18/2015 13:41   Dg Chest 2 View  Result Date: 12/17/2015 CLINICAL DATA:  Patient  with cough and centralized chest pain. EXAM: CHEST  2 VIEW COMPARISON:  Chest radiograph 12/23/2014. FINDINGS: Normal cardiac and mediastinal contours. No consolidative pulmonary opacities. No pleural effusion or pneumothorax. Regional skeleton is unremarkable. IMPRESSION: No active cardiopulmonary disease. Electronically Signed   By: Lovey Newcomer M.D.   On: 12/17/2015 10:05    Assessment/Plan 1. COPD exacerbation ([employer])  Continue scheduled Xopenex nebulizers  O2 2 L [place] keep sats >88%  2. Panic disorder without agoraphobia  Ensure pt's meds are ordered as they are at home  Alprazolam 0.5 1 tablet TID prn- anxiety  Alprazolam 1 mg po Q HS prn- sleep     Family/ staff Communication:   Total Time:  Documentation:  Face to Face:  Family/Phone:   Labs/tests ordered:    Medication list reviewed and assessed for continued appropriateness. Monthly medication orders reviewed and signed.  Vikki Ports, NP-C Geriatrics [facility] [phone], [place] [address] Cell Phone (Mon-Fri 8am-5pm):  [phone] On Call:  [phone] & follow prompts after 5pm & weekends Office Phone:  [phone] Office Fax:  [phone]

## 2016-01-06 ENCOUNTER — Encounter: Admission: RE | Admit: 2016-01-06 | Payer: PPO | Source: Ambulatory Visit | Admitting: Internal Medicine

## 2016-01-10 ENCOUNTER — Encounter: Payer: Self-pay | Admitting: Family Medicine

## 2016-01-10 ENCOUNTER — Ambulatory Visit (INDEPENDENT_AMBULATORY_CARE_PROVIDER_SITE_OTHER): Payer: PPO | Admitting: Family Medicine

## 2016-01-10 DIAGNOSIS — F5105 Insomnia due to other mental disorder: Secondary | ICD-10-CM

## 2016-01-10 DIAGNOSIS — G4700 Insomnia, unspecified: Secondary | ICD-10-CM | POA: Insufficient documentation

## 2016-01-10 DIAGNOSIS — F3341 Major depressive disorder, recurrent, in partial remission: Secondary | ICD-10-CM | POA: Diagnosis not present

## 2016-01-10 DIAGNOSIS — F99 Mental disorder, not otherwise specified: Secondary | ICD-10-CM | POA: Diagnosis not present

## 2016-01-10 DIAGNOSIS — F410 Panic disorder [episodic paroxysmal anxiety] without agoraphobia: Secondary | ICD-10-CM

## 2016-01-10 MED ORDER — ALPRAZOLAM 1 MG PO TABS: 1.0000 mg | ORAL_TABLET | Freq: Every evening | ORAL | 0 refills | Status: DC | PRN

## 2016-01-10 MED ORDER — ALPRAZOLAM 0.5 MG PO TABS: 0.5000 mg | ORAL_TABLET | Freq: Three times a day (TID) | ORAL | 0 refills | Status: DC | PRN

## 2016-01-10 MED ORDER — CITALOPRAM HYDROBROMIDE 20 MG PO TABS: 20.0000 mg | ORAL_TABLET | Freq: Every day | ORAL | 0 refills | Status: DC

## 2016-01-10 NOTE — Progress Notes (Signed)
Name: [name]   MRN: [id number]    DOB: [birth year]   Date:01/10/2016       Progress Note  Subjective  Chief Complaint  Chief Complaint  Patient presents with  . Follow-up    1 mo  . Medication Refill    xanax    Anxiety  Presents for follow-up visit. Symptoms include depressed mood, excessive worry, insomnia, malaise, nervous/anxious behavior and panic. The severity of symptoms is moderate and causing significant distress.   Her past medical history is significant for depression.  Depression       The patient presents with depression.  This is a chronic problem.The problem is unchanged.  Associated symptoms include insomnia.  Associated symptoms include no helplessness, no hopelessness, no decreased interest and not sad.  Past treatments include SSRIs - Selective serotonin reuptake inhibitors.  Compliance with treatment is good.  Past medical history includes chronic pain, anxiety and depression.   Insomnia  Primary symptoms: frequent awakening.  The symptoms are aggravated by anxiety. Typical bedtime:  11-12 P.M..  How long after going to bed to you fall asleep: 30-60 minutes.   PMH includes: depression.     Past Medical History:  Diagnosis Date  . Anxiety   . Arthritis   . Chronic back pain   . Depression   . GERD (gastroesophageal reflux disease)   . Hypertension   . Hypothyroidism   . Pneumonia    RECENT X 3  . PONV (postoperative nausea and vomiting)   . Psoriasis   . Thyroid disease     Past Surgical History:  Procedure Laterality Date  . ABDOMINAL HYSTERECTOMY     heavy bleeding  . bladder stimulator    . CARPAL TUNNEL RELEASE    . CATARACT EXTRACTION W/PHACO Right 03/07/2015   Procedure: CATARACT EXTRACTION PHACO AND INTRAOCULAR LENS PLACEMENT (IOC);  Surgeon: Estill Cotta, MD;  Location: [facility];  Service: Ophthalmology;  Laterality: Right;  [name] 01:14 AP% 26.4 CDE 33.04 fluid pack lot # [id number]  . CATARACT EXTRACTION W/PHACO Left 03/21/2015    Procedure: CATARACT EXTRACTION PHACO AND INTRAOCULAR LENS PLACEMENT (IOC);  Surgeon: Estill Cotta, MD;  Location: [facility];  Service: Ophthalmology;  Laterality: Left;  [name] 01:21 AP% 25.6 CDE 39.78 fluid pack lot # [id number]  . HAND SURGERY Right   . INTERSTIM IMPLANT REMOVAL N/A 09/07/2015   Procedure: REMOVAL OF INTERSTIM IMPLANT;  Surgeon: Bjorn Loser, MD;  Location: [facility];  Service: Urology;  Laterality: N/A;  . OOPHORECTOMY Right    ovarian cyst    Family History  Problem Relation Age of Onset  . Heart disease Mother   . COPD Father     Social History   Social History  . Marital status: Widowed    Spouse name: N/A  . Number of children: N/A  . Years of education: N/A   Occupational History  . Not on file.   Social History Main Topics  . Smoking status: Current Every Day Smoker    Packs/day: 1.00    Types: Cigarettes  . Smokeless tobacco: Never Used  . Alcohol use No  . Drug use: No  . Sexual activity: No   Other Topics Concern  . Not on file   Social History Narrative  . No narrative on file     Current Outpatient Prescriptions:  .  acetaminophen (TYLENOL) 500 MG tablet, Take 500 mg by mouth every 6 (six) hours as needed for moderate pain. Reported on 07/01/2015, Disp: , Rfl:  .  albuterol (PROVENTIL HFA;VENTOLIN HFA) 108 (90 Base) MCG/ACT inhaler, Inhale 2 puffs into the lungs every 6 (six) hours as needed for wheezing or shortness of breath., Disp: 1 Inhaler, Rfl: 2 .  ALPRAZolam (XANAX) 0.5 MG tablet, Take 1 tablet (0.5 mg total) by mouth 3 (three) times daily as needed for anxiety., Disp: 40 tablet, Rfl: 0 .  ALPRAZolam (XANAX) 1 MG tablet, Take 1 tablet (1 mg total) by mouth at bedtime as needed for anxiety., Disp: 20 tablet, Rfl: 0 .  amoxicillin-clavulanate (AUGMENTIN) 875-125 MG tablet, Take 1 tablet by mouth 2 (two) times daily., Disp: 12 tablet, Rfl: 0 .  citalopram (CELEXA) 20 MG tablet, Take 1 tablet (20 mg total) by mouth daily., Disp: 90  tablet, Rfl: 0 .  diltiazem (CARDIZEM CD) 120 MG 24 hr capsule, Take 1 capsule (120 mg total) by mouth daily., Disp: 30 capsule, Rfl: 0 .  guaiFENesin-dextromethorphan (ROBITUSSIN DM) 100-10 MG/5ML syrup, Take 5 mLs by mouth every 4 (four) hours as needed for cough., Disp: 118 mL, Rfl: 0 .  levalbuterol (XOPENEX) 1.25 MG/0.5ML nebulizer solution, Take 1.25 mg by nebulization every 6 (six) hours., Disp: 1 each, Rfl: 12 .  lisinopril (PRINIVIL,ZESTRIL) 10 MG tablet, TAKE 1/2 TABLET BY MOUTH EVERY MORNING, Disp: 90 tablet, Rfl: 0 .  omeprazole (PRILOSEC) 20 MG capsule, Take 1 capsule (20 mg total) by mouth daily., Disp: 30 capsule, Rfl: 0 .  pravastatin (PRAVACHOL) 20 MG tablet, Take by mouth., Disp: , Rfl:  .  Secukinumab (COSENTYX [place]), Inject into the skin every 30 (thirty) days. 2 injections per month, Disp: , Rfl:  .  SYNTHROID 100 MCG tablet, TAKE ONE TABLET BY MOUTH EVERY MORNING BEFORE BREAKFAST, Disp: 90 tablet, Rfl: 0 .  hydrOXYzine (ATARAX/VISTARIL) 25 MG tablet, , Disp: , Rfl:   Allergies  Allergen Reactions  . Ciprofloxacin Hives and Diarrhea  . Oxycodone Diarrhea  . Oxycodone-Acetaminophen Diarrhea  . Oxycodone-Acetaminophen Diarrhea  . Sulfa Antibiotics Rash    Throat, mouth Throat, mouth     Review of Systems  Psychiatric/Behavioral: Positive for depression. The patient is nervous/anxious and has insomnia.       Objective  Vitals:   01/10/16 1029  BP: 106/66  Pulse: 95  Resp: 18  Temp: 97.4 F (36.3 C)  TempSrc: Oral  SpO2: 93%  Weight: 179 lb 9.6 oz (81.5 kg)  Height: 5\' 3"  (1.6 m)    Physical Exam  Constitutional: She is oriented to person, place, and time and well-developed, well-nourished, and in no distress.  Cardiovascular: Normal rate, regular rhythm and normal heart sounds.   No murmur heard. Pulmonary/Chest: Effort normal and breath sounds normal. She has no wheezes.  Neurological: She is alert and oriented to person, place, and time.   Psychiatric: Memory and judgment normal. Her mood appears anxious. She exhibits ordered thought content. She has a flat affect.  Nursing note and vitals reviewed.     Assessment & Plan  1. Recurrent major depressive disorder, in partial remission (HCC)  - citalopram (CELEXA) 20 MG tablet; Take 1 tablet (20 mg total) by mouth daily.  Dispense: 90 tablet; Refill: 0  2. Panic disorder without agoraphobia  - ALPRAZolam (XANAX) 0.5 MG tablet; Take 1 tablet (0.5 mg total) by mouth 3 (three) times daily as needed for anxiety.  Dispense: 40 tablet; Refill: 0  3. Insomnia due to other mental disorder  - ALPRAZolam (XANAX) 1 MG tablet; Take 1 tablet (1 mg total) by mouth at bedtime as needed for anxiety.  Dispense: 20 tablet; Refill: 0   Shemaiah Round [facility] [employer] [facility] 01/10/2016 10:56 AM

## 2016-01-11 ENCOUNTER — Telehealth: Payer: Self-pay | Admitting: Family Medicine

## 2016-01-11 NOTE — Telephone Encounter (Signed)
Pt would like a call back about her Alprazolam. Pt states her RX was not the same amount or MG's and pt was unaware that this was going to happen. She is requesting a call back.

## 2016-02-08 DIAGNOSIS — L408 Other psoriasis: Secondary | ICD-10-CM | POA: Diagnosis not present

## 2016-02-10 ENCOUNTER — Encounter: Payer: Self-pay | Admitting: Family Medicine

## 2016-02-10 ENCOUNTER — Ambulatory Visit (INDEPENDENT_AMBULATORY_CARE_PROVIDER_SITE_OTHER): Payer: PPO | Admitting: Family Medicine

## 2016-02-10 VITALS — BP 105/64 | HR 111 | Temp 98.7°F | Resp 17 | Ht 63.0 in | Wt 183.5 lb

## 2016-02-10 DIAGNOSIS — J441 Chronic obstructive pulmonary disease with (acute) exacerbation: Secondary | ICD-10-CM | POA: Diagnosis not present

## 2016-02-10 DIAGNOSIS — I10 Essential (primary) hypertension: Secondary | ICD-10-CM

## 2016-02-10 DIAGNOSIS — F99 Mental disorder, not otherwise specified: Secondary | ICD-10-CM | POA: Diagnosis not present

## 2016-02-10 DIAGNOSIS — F410 Panic disorder [episodic paroxysmal anxiety] without agoraphobia: Secondary | ICD-10-CM

## 2016-02-10 DIAGNOSIS — F5105 Insomnia due to other mental disorder: Secondary | ICD-10-CM | POA: Diagnosis not present

## 2016-02-10 MED ORDER — ALPRAZOLAM 1 MG PO TABS: 1.0000 mg | ORAL_TABLET | Freq: Every evening | ORAL | 0 refills | Status: DC | PRN

## 2016-02-10 MED ORDER — ALPRAZOLAM 0.5 MG PO TABS: 0.5000 mg | ORAL_TABLET | Freq: Three times a day (TID) | ORAL | 0 refills | Status: DC | PRN

## 2016-02-10 MED ORDER — FLUTICASONE FUROATE-VILANTEROL 100-25 MCG/INH IN AEPB: 1.0000 | INHALATION_SPRAY | Freq: Every day | RESPIRATORY_TRACT | 2 refills | Status: DC

## 2016-02-10 MED ORDER — DILTIAZEM HCL ER COATED BEADS 120 MG PO CP24: 120.0000 mg | ORAL_CAPSULE | Freq: Every day | ORAL | 0 refills | Status: DC

## 2016-02-10 NOTE — Progress Notes (Signed)
Name: [name]   MRN: [id number]    DOB: 1950-09-18   Date:02/10/2016       Progress Note  Subjective  Chief Complaint  Chief Complaint  Patient presents with  . Follow-up    1 mo  . Medication Refill    Anxiety  Presents for follow-up visit. The problem has been unchanged. Symptoms include nervous/anxious behavior, panic and shortness of breath. Patient reports no chest pain. The severity of symptoms is moderate and causing significant distress.   Her past medical history is significant for anxiety/panic attacks. Past treatments include benzodiazephines.  Hypertension  This is a chronic problem. The problem is unchanged. The problem is controlled. Associated symptoms include anxiety and shortness of breath. Pertinent negatives include no blurred vision, chest pain, headaches or orthopnea. Past treatments include calcium channel blockers and ACE inhibitors. There is no history of kidney disease, CAD/MI or CVA.   Cough and Wheezing: Present for 2 weeks, cough is mostly dry, but sometimes has phlegm, no fevers but had chills and feels tired. She was diagnosed with Pneumonia and COPD exacerbation 8 weeks ago and was admitted to [facility] for treatment followed by Rehab at [facility].  She was prescribed ALbuterol inhaler and Xopenex nebulizer at discharge.    Past Medical History:  Diagnosis Date  . Anxiety   . Arthritis   . Chronic back pain   . Depression   . GERD (gastroesophageal reflux disease)   . Hypertension   . Hypothyroidism   . Pneumonia    RECENT X 3  . PONV (postoperative nausea and vomiting)   . Psoriasis   . Thyroid disease     Past Surgical History:  Procedure Laterality Date  . ABDOMINAL HYSTERECTOMY     heavy bleeding  . bladder stimulator    . CARPAL TUNNEL RELEASE    . CATARACT EXTRACTION W/PHACO Right 03/07/2015   Procedure: CATARACT EXTRACTION PHACO AND INTRAOCULAR LENS PLACEMENT (IOC);  Surgeon: Estill Cotta, MD;  Location: [facility];  Service:  Ophthalmology;  Laterality: Right;  [name] 01:14 AP% 26.4 CDE 33.04 fluid pack lot # [id number]  . CATARACT EXTRACTION W/PHACO Left 03/21/2015   Procedure: CATARACT EXTRACTION PHACO AND INTRAOCULAR LENS PLACEMENT (IOC);  Surgeon: Estill Cotta, MD;  Location: [facility];  Service: Ophthalmology;  Laterality: Left;  [name] 01:21 AP% 25.6 CDE 39.78 fluid pack lot # [id number]  . HAND SURGERY Right   . INTERSTIM IMPLANT REMOVAL N/A 09/07/2015   Procedure: REMOVAL OF INTERSTIM IMPLANT;  Surgeon: Bjorn Loser, MD;  Location: [facility];  Service: Urology;  Laterality: N/A;  . OOPHORECTOMY Right    ovarian cyst    Family History  Problem Relation Age of Onset  . Heart disease Mother   . COPD Father     Social History   Social History  . Marital status: Widowed    Spouse name: N/A  . Number of children: N/A  . Years of education: N/A   Occupational History  . Not on file.   Social History Main Topics  . Smoking status: Current Every Day Smoker    Packs/day: 1.00    Types: Cigarettes  . Smokeless tobacco: Never Used  . Alcohol use No  . Drug use: No  . Sexual activity: No   Other Topics Concern  . Not on file   Social History Narrative  . No narrative on file     Current Outpatient Prescriptions:  .  acetaminophen (TYLENOL) 500 MG tablet, Take 500 mg by mouth every  6 (six) hours as needed for moderate pain. Reported on 07/01/2015, Disp: , Rfl:  .  albuterol (PROVENTIL HFA;VENTOLIN HFA) 108 (90 Base) MCG/ACT inhaler, Inhale 2 puffs into the lungs every 6 (six) hours as needed for wheezing or shortness of breath., Disp: 1 Inhaler, Rfl: 2 .  ALPRAZolam (XANAX) 0.5 MG tablet, Take 1 tablet (0.5 mg total) by mouth 3 (three) times daily as needed for anxiety., Disp: 40 tablet, Rfl: 0 .  ALPRAZolam (XANAX) 1 MG tablet, Take 1 tablet (1 mg total) by mouth at bedtime as needed for anxiety., Disp: 20 tablet, Rfl: 0 .  citalopram (CELEXA) 20 MG tablet, Take 1 tablet (20 mg total) by mouth  daily., Disp: 90 tablet, Rfl: 0 .  diltiazem (CARDIZEM CD) 120 MG 24 hr capsule, Take 1 capsule (120 mg total) by mouth daily., Disp: 30 capsule, Rfl: 0 .  hydrOXYzine (ATARAX/VISTARIL) 25 MG tablet, , Disp: , Rfl:  .  levalbuterol (XOPENEX) 1.25 MG/0.5ML nebulizer solution, Take 1.25 mg by nebulization every 6 (six) hours., Disp: 1 each, Rfl: 12 .  lisinopril (PRINIVIL,ZESTRIL) 10 MG tablet, TAKE 1/2 TABLET BY MOUTH EVERY MORNING, Disp: 90 tablet, Rfl: 0 .  omeprazole (PRILOSEC) 20 MG capsule, Take 1 capsule (20 mg total) by mouth daily., Disp: 30 capsule, Rfl: 0 .  pravastatin (PRAVACHOL) 20 MG tablet, Take by mouth., Disp: , Rfl:  .  Secukinumab (COSENTYX [place]), Inject into the skin every 30 (thirty) days. 2 injections per month, Disp: , Rfl:  .  SYNTHROID 100 MCG tablet, TAKE ONE TABLET BY MOUTH EVERY MORNING BEFORE BREAKFAST, Disp: 90 tablet, Rfl: 0  Allergies  Allergen Reactions  . Ciprofloxacin Hives and Diarrhea  . Oxycodone Diarrhea  . Oxycodone-Acetaminophen Diarrhea  . Oxycodone-Acetaminophen Diarrhea  . Sulfa Antibiotics Rash    Throat, mouth Throat, mouth     Review of Systems  Eyes: Negative for blurred vision.  Respiratory: Positive for shortness of breath.   Cardiovascular: Negative for chest pain and orthopnea.  Neurological: Negative for headaches.  Psychiatric/Behavioral: The patient is nervous/anxious.     Objective  Vitals:   02/10/16 0944  BP: 105/64  Pulse: (!) 111  Resp: 17  Temp: 98.7 F (37.1 C)  TempSrc: Oral  SpO2: 93%  Weight: 183 lb 8 oz (83.2 kg)  Height: 5\' 3"  (1.6 m)    Physical Exam  Constitutional: She is oriented to person, place, and time and well-developed, well-nourished, and in no distress.  HENT:  Head: Normocephalic and atraumatic.  Cardiovascular: Normal rate, regular rhythm, S1 normal, S2 normal and normal heart sounds.   No murmur heard. Pulmonary/Chest: Effort normal and breath sounds normal. She has no wheezes.   Neurological: She is alert and oriented to person, place, and time.  Psychiatric: Memory and judgment normal. Her mood appears anxious. She has a flat affect.  Nursing note and vitals reviewed.     Assessment & Plan  1. Insomnia due to other mental disorder Stable, continue alprazolam at bedtime when needed. - ALPRAZolam (XANAX) 1 MG tablet; Take 1 tablet (1 mg total) by mouth at bedtime as needed for anxiety.  Dispense: 20 tablet; Refill: 0  2. Panic disorder without agoraphobia Table and responsive to alprazolam 0.5 million and taken up to 3 times daily when needed, refills provided - ALPRAZolam (XANAX) 0.5 MG tablet; Take 1 tablet (0.5 mg total) by mouth 3 (three) times daily as needed for anxiety.  Dispense: 40 tablet; Refill: 0  3. COPD exacerbation [facility]) Reviewed ER  and hospital summary, started on ICS-LABA combination to control her COPD symptoms. Ordered a chest x-ray to ensure resolution of pneumonia. Advised to use nebulizer - fluticasone furoate-vilanterol (BREO ELLIPTA) 100-25 MCG/INH AEPB; Inhale 1 puff into the lungs daily.  Dispense: 28 each; Refill: 2 - DG Chest 2 View; Future  4. Benign essential HTN BP stable on present antihypertensive therapy - diltiazem (CARDIZEM CD) 120 MG 24 hr capsule; Take 1 capsule (120 mg total) by mouth daily.  Dispense: 90 capsule; Refill: 0   Jeany Seville [facility] 02/10/2016 9:50 AM

## 2016-02-13 ENCOUNTER — Telehealth: Payer: Self-pay | Admitting: Family Medicine

## 2016-02-13 ENCOUNTER — Ambulatory Visit
Admission: RE | Admit: 2016-02-13 | Discharge: 2016-02-13 | Disposition: A | Discharge status: Home / Self Care | Payer: PPO | Source: Ambulatory Visit | Attending: Family Medicine | Admitting: Family Medicine

## 2016-02-13 DIAGNOSIS — I700 Atherosclerosis of aorta: Secondary | ICD-10-CM | POA: Insufficient documentation

## 2016-02-13 DIAGNOSIS — J441 Chronic obstructive pulmonary disease with (acute) exacerbation: Secondary | ICD-10-CM | POA: Insufficient documentation

## 2016-02-13 DIAGNOSIS — Z8701 Personal history of pneumonia (recurrent): Secondary | ICD-10-CM | POA: Insufficient documentation

## 2016-02-13 DIAGNOSIS — R05 Cough: Secondary | ICD-10-CM | POA: Diagnosis not present

## 2016-02-13 NOTE — Telephone Encounter (Signed)
Called Pt to schedule AWV with [facility] - knb °

## 2016-02-15 ENCOUNTER — Telehealth: Payer: Self-pay | Admitting: Family Medicine

## 2016-02-15 NOTE — Telephone Encounter (Signed)
Please explain to patient 

## 2016-02-15 NOTE — Telephone Encounter (Signed)
Pt is requesting chest X ray results.

## 2016-02-15 NOTE — Telephone Encounter (Signed)
Please inform patient that the chest x-ray showed resolution of pneumonia seen on an earlier chest x-ray from November 2017, she does have aortic atherosclerosis

## 2016-02-17 NOTE — Telephone Encounter (Signed)
Chest x-ray results have been explained to patient

## 2016-02-21 DIAGNOSIS — I10 Essential (primary) hypertension: Secondary | ICD-10-CM | POA: Diagnosis not present

## 2016-02-21 DIAGNOSIS — R42 Dizziness and giddiness: Secondary | ICD-10-CM | POA: Insufficient documentation

## 2016-02-21 DIAGNOSIS — E782 Mixed hyperlipidemia: Secondary | ICD-10-CM | POA: Diagnosis not present

## 2016-02-21 DIAGNOSIS — I6523 Occlusion and stenosis of bilateral carotid arteries: Secondary | ICD-10-CM | POA: Diagnosis not present

## 2016-03-04 DIAGNOSIS — R05 Cough: Secondary | ICD-10-CM | POA: Diagnosis not present

## 2016-03-04 DIAGNOSIS — J441 Chronic obstructive pulmonary disease with (acute) exacerbation: Secondary | ICD-10-CM | POA: Diagnosis not present

## 2016-03-12 ENCOUNTER — Ambulatory Visit (INDEPENDENT_AMBULATORY_CARE_PROVIDER_SITE_OTHER): Payer: PPO | Admitting: Family Medicine

## 2016-03-12 ENCOUNTER — Encounter: Payer: Self-pay | Admitting: Family Medicine

## 2016-03-12 VITALS — BP 124/64 | HR 84 | Temp 98.0°F | Resp 16 | Ht 63.0 in | Wt 188.2 lb

## 2016-03-12 DIAGNOSIS — F410 Panic disorder [episodic paroxysmal anxiety] without agoraphobia: Secondary | ICD-10-CM | POA: Diagnosis not present

## 2016-03-12 DIAGNOSIS — F99 Mental disorder, not otherwise specified: Secondary | ICD-10-CM | POA: Diagnosis not present

## 2016-03-12 DIAGNOSIS — F5105 Insomnia due to other mental disorder: Secondary | ICD-10-CM | POA: Diagnosis not present

## 2016-03-12 DIAGNOSIS — J441 Chronic obstructive pulmonary disease with (acute) exacerbation: Secondary | ICD-10-CM

## 2016-03-12 MED ORDER — ALPRAZOLAM 1 MG PO TABS: 1.0000 mg | ORAL_TABLET | Freq: Every evening | ORAL | 0 refills | Status: DC | PRN

## 2016-03-12 MED ORDER — ALPRAZOLAM 0.5 MG PO TABS: 0.5000 mg | ORAL_TABLET | Freq: Three times a day (TID) | ORAL | 0 refills | Status: DC | PRN

## 2016-03-12 NOTE — Progress Notes (Signed)
Name: [name]   MRN: [id number]    DOB: Jan 11, 1951   Date:03/12/2016       Progress Note  Subjective  Chief Complaint  Chief Complaint  Patient presents with  . COPD    1 month follow up pt has been to acute care for lung infection currently on antibiotics  . Depression    Anxiety  Presents for follow-up visit. The problem has been unchanged. Symptoms include depressed mood, excessive worry, insomnia, nervous/anxious behavior, panic and shortness of breath (has been recovering from COPD exacerbation). The severity of symptoms is moderate and causing significant distress.   Her past medical history is significant for anxiety/panic attacks. Past treatments include benzodiazephines.  Insomnia  Primary symptoms: frequent awakening, premature morning awakening.  The onset quality is gradual. The symptoms are aggravated by anxiety and pain. Past treatments include medication. Typical bedtime:  8-10 P.M..  How long after going to bed to you fall asleep: 15-30 minutes.   PMH includes: hypertension, depression, family stress or anxiety, chronic pain.     Past Medical History:  Diagnosis Date  . Anxiety   . Arthritis   . Chronic back pain   . Depression   . GERD (gastroesophageal reflux disease)   . Hypertension   . Hypothyroidism   . Pneumonia    RECENT X 3  . PONV (postoperative nausea and vomiting)   . Psoriasis   . Thyroid disease     Past Surgical History:  Procedure Laterality Date  . ABDOMINAL HYSTERECTOMY     heavy bleeding  . bladder stimulator    . CARPAL TUNNEL RELEASE    . CATARACT EXTRACTION W/PHACO Right 03/07/2015   Procedure: CATARACT EXTRACTION PHACO AND INTRAOCULAR LENS PLACEMENT (IOC);  Surgeon: Estill Cotta, MD;  Location: [facility];  Service: Ophthalmology;  Laterality: Right;  [name] 01:14 AP% 26.4 CDE 33.04 fluid pack lot # [id number]  . CATARACT EXTRACTION W/PHACO Left 03/21/2015   Procedure: CATARACT EXTRACTION PHACO AND INTRAOCULAR LENS PLACEMENT  (IOC);  Surgeon: Estill Cotta, MD;  Location: [facility];  Service: Ophthalmology;  Laterality: Left;  [name] 01:21 AP% 25.6 CDE 39.78 fluid pack lot # [id number]  . HAND SURGERY Right   . INTERSTIM IMPLANT REMOVAL N/A 09/07/2015   Procedure: REMOVAL OF INTERSTIM IMPLANT;  Surgeon: Bjorn Loser, MD;  Location: [facility];  Service: Urology;  Laterality: N/A;  . OOPHORECTOMY Right    ovarian cyst    Family History  Problem Relation Age of Onset  . Heart disease Mother   . COPD Father     Social History   Social History  . Marital status: Widowed    Spouse name: N/A  . Number of children: N/A  . Years of education: N/A   Occupational History  . Not on file.   Social History Main Topics  . Smoking status: Current Every Day Smoker    Packs/day: 1.00    Types: Cigarettes  . Smokeless tobacco: Never Used  . Alcohol use No  . Drug use: No  . Sexual activity: No   Other Topics Concern  . Not on file   Social History Narrative  . No narrative on file     Current Outpatient Prescriptions:  .  chlorpheniramine-HYDROcodone (TUSSIONEX) 10-8 MG/5ML SUER, Take by mouth., Disp: , Rfl:  .  doxycycline (ADOXA) 150 MG tablet, Take by mouth., Disp: , Rfl:  .  predniSONE (DELTASONE) 10 MG tablet, Take by mouth., Disp: , Rfl:  .  acetaminophen (TYLENOL) 500 MG  tablet, Take 500 mg by mouth every 6 (six) hours as needed for moderate pain. Reported on 07/01/2015, Disp: , Rfl:  .  albuterol (PROVENTIL HFA;VENTOLIN HFA) 108 (90 Base) MCG/ACT inhaler, Inhale 2 puffs into the lungs every 6 (six) hours as needed for wheezing or shortness of breath., Disp: 1 Inhaler, Rfl: 2 .  ALPRAZolam (XANAX) 0.5 MG tablet, Take 1 tablet (0.5 mg total) by mouth 3 (three) times daily as needed for anxiety., Disp: 90 tablet, Rfl: 0 .  ALPRAZolam (XANAX) 1 MG tablet, Take 1 tablet (1 mg total) by mouth at bedtime as needed for anxiety., Disp: 30 tablet, Rfl: 0 .  citalopram (CELEXA) 20 MG tablet, Take 1 tablet (20  mg total) by mouth daily., Disp: 90 tablet, Rfl: 0 .  diltiazem (CARDIZEM CD) 120 MG 24 hr capsule, Take 1 capsule (120 mg total) by mouth daily., Disp: 90 capsule, Rfl: 0 .  fluticasone furoate-vilanterol (BREO ELLIPTA) 100-25 MCG/INH AEPB, Inhale 1 puff into the lungs daily., Disp: 28 each, Rfl: 2 .  hydrOXYzine (ATARAX/VISTARIL) 25 MG tablet, , Disp: , Rfl:  .  levalbuterol (XOPENEX) 1.25 MG/0.5ML nebulizer solution, Take 1.25 mg by nebulization every 6 (six) hours., Disp: 1 each, Rfl: 12 .  lisinopril (PRINIVIL,ZESTRIL) 10 MG tablet, TAKE 1/2 TABLET BY MOUTH EVERY MORNING, Disp: 90 tablet, Rfl: 0 .  omeprazole (PRILOSEC) 20 MG capsule, Take 1 capsule (20 mg total) by mouth daily., Disp: 30 capsule, Rfl: 0 .  pravastatin (PRAVACHOL) 20 MG tablet, Take by mouth., Disp: , Rfl:  .  Secukinumab (COSENTYX [place]), Inject into the skin every 30 (thirty) days. 2 injections per month, Disp: , Rfl:  .  SYNTHROID 100 MCG tablet, TAKE ONE TABLET BY MOUTH EVERY MORNING BEFORE BREAKFAST, Disp: 90 tablet, Rfl: 0  Allergies  Allergen Reactions  . Ciprofloxacin Hives and Diarrhea  . Oxycodone Diarrhea  . Oxycodone-Acetaminophen Diarrhea  . Oxycodone-Acetaminophen Diarrhea  . Sulfa Antibiotics Rash    Throat, mouth Throat, mouth     Review of Systems  Respiratory: Positive for shortness of breath (has been recovering from COPD exacerbation).   Psychiatric/Behavioral: Positive for depression. The patient is nervous/anxious and has insomnia.     Objective  Vitals:   03/12/16 0926  BP: 124/64  Pulse: 84  Resp: 16  Temp: 98 F (36.7 C)  SpO2: 94%  Weight: 188 lb 4 oz (85.4 kg)  Height: 5\' 3"  (1.6 m)    Physical Exam  Constitutional: She is oriented to person, place, and time and well-developed, well-nourished, and in no distress.  HENT:  Head: Normocephalic and atraumatic.  Cardiovascular: Normal rate, regular rhythm, S1 normal, S2 normal and normal heart sounds.   No murmur  heard. Pulmonary/Chest: Effort normal and breath sounds normal. She has no wheezes.  Abdominal: Soft. Bowel sounds are normal. There is no tenderness.  Neurological: She is alert and oriented to person, place, and time.  Skin: Skin is warm and dry.  Psychiatric: Memory and judgment normal. Her mood appears anxious. She has a flat affect.  Nursing note and vitals reviewed.    Assessment & Plan  1. Insomnia due to other mental disorder Stable and responsive to alprazolam taken every day at bedtime, refills provided - ALPRAZolam (XANAX) 1 MG tablet; Take 1 tablet (1 mg total) by mouth at bedtime as needed for anxiety.  Dispense: 30 tablet; Refill: 0  2. Panic disorder without agoraphobia Symptoms responsive to alprazolam taken up to 3 times daily when needed. Refills provided  and follow-up in one month - ALPRAZolam (XANAX) 0.5 MG tablet; Take 1 tablet (0.5 mg total) by mouth 3 (three) times daily as needed for anxiety.  Dispense: 90 tablet; Refill: 0  3. COPD exacerbation ([place]) Note from urgent care reviewed, was started on antibiotic, prednisone for COPD exacerbation. Appears to be doing well, no wheezing. Reassured and advised to finish the course.   Devonta Blanford [facility] 03/12/2016 9:36 AM

## 2016-04-09 ENCOUNTER — Ambulatory Visit (INDEPENDENT_AMBULATORY_CARE_PROVIDER_SITE_OTHER): Payer: PPO | Admitting: Family Medicine

## 2016-04-09 ENCOUNTER — Encounter: Payer: Self-pay | Admitting: Family Medicine

## 2016-04-09 VITALS — BP 122/73 | HR 115 | Temp 97.7°F | Resp 17 | Ht 63.0 in | Wt 188.4 lb

## 2016-04-09 DIAGNOSIS — E039 Hypothyroidism, unspecified: Secondary | ICD-10-CM | POA: Diagnosis not present

## 2016-04-09 DIAGNOSIS — F99 Mental disorder, not otherwise specified: Secondary | ICD-10-CM

## 2016-04-09 DIAGNOSIS — F410 Panic disorder [episodic paroxysmal anxiety] without agoraphobia: Secondary | ICD-10-CM

## 2016-04-09 DIAGNOSIS — F5105 Insomnia due to other mental disorder: Secondary | ICD-10-CM | POA: Diagnosis not present

## 2016-04-09 DIAGNOSIS — I10 Essential (primary) hypertension: Secondary | ICD-10-CM | POA: Diagnosis not present

## 2016-04-09 LAB — TSH: TSH: 2.97 m[IU]/L

## 2016-04-09 MED ORDER — LISINOPRIL 10 MG PO TABS: 5.0000 mg | ORAL_TABLET | Freq: Every morning | ORAL | 0 refills | Status: DC

## 2016-04-09 MED ORDER — ALPRAZOLAM 1 MG PO TABS: 1.0000 mg | ORAL_TABLET | Freq: Every evening | ORAL | 0 refills | Status: DC | PRN

## 2016-04-09 MED ORDER — ALPRAZOLAM 0.5 MG PO TABS: 0.5000 mg | ORAL_TABLET | Freq: Three times a day (TID) | ORAL | 0 refills | Status: DC | PRN

## 2016-04-09 MED ORDER — LEVOTHYROXINE SODIUM 100 MCG PO TABS: 100.0000 ug | ORAL_TABLET | Freq: Every day | ORAL | 1 refills | Status: DC

## 2016-04-09 NOTE — Progress Notes (Signed)
Name: [name]   MRN: [id number]    DOB: Sep 28, 1950   Date:04/09/2016       Progress Note  Subjective  Chief Complaint  Chief Complaint  Patient presents with  . Follow-up    1 mo  . Medication Refill    Hypertension  This is a chronic problem. The problem is unchanged. The problem is controlled. Associated symptoms include anxiety and headaches. Pertinent negatives include no blurred vision, chest pain, palpitations, shortness of breath or sweats. Past treatments include ACE inhibitors and calcium channel blockers. There is no history of kidney disease, CAD/MI or CVA. Identifiable causes of hypertension include a thyroid problem.  Thyroid Problem  Presents for follow-up visit. Symptoms include anxiety, depressed mood and fatigue. Patient reports no cold intolerance, constipation, leg swelling or palpitations. The symptoms have been stable.  Anxiety  Presents for follow-up visit. Symptoms include depressed mood, excessive worry, insomnia, irritability and nervous/anxious behavior. Patient reports no chest pain, palpitations, panic or shortness of breath. The severity of symptoms is causing significant distress and severe.      Past Medical History:  Diagnosis Date  . Anxiety   . Arthritis   . Chronic back pain   . Depression   . GERD (gastroesophageal reflux disease)   . Hypertension   . Hypothyroidism   . Pneumonia    RECENT X 3  . PONV (postoperative nausea and vomiting)   . Psoriasis   . Thyroid disease     Past Surgical History:  Procedure Laterality Date  . ABDOMINAL HYSTERECTOMY     heavy bleeding  . bladder stimulator    . CARPAL TUNNEL RELEASE    . CATARACT EXTRACTION W/PHACO Right 03/07/2015   Procedure: CATARACT EXTRACTION PHACO AND INTRAOCULAR LENS PLACEMENT (IOC);  Surgeon: Estill Cotta, MD;  Location: [facility] ORS;  Service: Ophthalmology;  Laterality: Right;  [name] 01:14 AP% 26.4 CDE 33.04 fluid pack lot # [id number]  . CATARACT EXTRACTION W/PHACO Left  03/21/2015   Procedure: CATARACT EXTRACTION PHACO AND INTRAOCULAR LENS PLACEMENT (IOC);  Surgeon: Estill Cotta, MD;  Location: [facility] OR[facility];  Service: Ophthalmology;  Laterality: Left;  [name] 01:21 AP% 25.6 CDE 39.78 fluid pack lot # [id number]  . HAND SURGERY Right   . INTERSTIM IMPLANT REMOVAL N/A 09/07/2015   Procedure: REMOVAL OF INTERSTIM IMPLANT;  Surgeon: Bjorn Loser, MD;  Location: [facility] OR[facility];  Service: Urology;  Laterality: N/A;  . OOPHORECTOMY Right    ovarian cyst    Family History  Problem Relation Age of Onset  . Heart disease Mother   . COPD Father     Social History   Social History  . Marital status: Widowed    Spouse name: N/A  . Number of children: N/A  . Years of education: N/A   Occupational History  . Not on file.   Social History Main Topics  . Smoking status: Current Every Day Smoker    Packs/day: 1.00    Types: Cigarettes  . Smokeless tobacco: Never Used  . Alcohol use No  . Drug use: No  . Sexual activity: No   Other Topics Concern  . Not on file   Social History Narrative  . No narrative on file     Current Outpatient Prescriptions:  .  acetaminophen (TYLENOL) 500 MG tablet, Take 500 mg by mouth every 6 (six) hours as needed for moderate pain. Reported on 07/01/2015, Disp: , Rfl:  .  albuterol (PROVENTIL HFA;VENTOLIN HFA) 108 (90 Base) MCG/ACT inhaler, Inhale 2 puffs into  the lungs every 6 (six) hours as needed for wheezing or shortness of breath., Disp: 1 Inhaler, Rfl: 2 .  ALPRAZolam (XANAX) 0.5 MG tablet, Take 1 tablet (0.5 mg total) by mouth 3 (three) times daily as needed for anxiety., Disp: 90 tablet, Rfl: 0 .  ALPRAZolam (XANAX) 1 MG tablet, Take 1 tablet (1 mg total) by mouth at bedtime as needed for anxiety., Disp: 30 tablet, Rfl: 0 .  citalopram (CELEXA) 20 MG tablet, Take 1 tablet (20 mg total) by mouth daily., Disp: 90 tablet, Rfl: 0 .  diltiazem (CARDIZEM CD) 120 MG 24 hr capsule, Take 1 capsule (120 mg total) by mouth daily.,  Disp: 90 capsule, Rfl: 0 .  fluticasone furoate-vilanterol (BREO ELLIPTA) 100-25 MCG/INH AEPB, Inhale 1 puff into the lungs daily., Disp: 28 each, Rfl: 2 .  hydrOXYzine (ATARAX/VISTARIL) 25 MG tablet, , Disp: , Rfl:  .  levalbuterol (XOPENEX) 1.25 MG/0.5ML nebulizer solution, Take 1.25 mg by nebulization every 6 (six) hours., Disp: 1 each, Rfl: 12 .  lisinopril (PRINIVIL,ZESTRIL) 10 MG tablet, TAKE 1/2 TABLET BY MOUTH EVERY MORNING, Disp: 90 tablet, Rfl: 0 .  omeprazole (PRILOSEC) 20 MG capsule, Take 1 capsule (20 mg total) by mouth daily., Disp: 30 capsule, Rfl: 0 .  pravastatin (PRAVACHOL) 20 MG tablet, Take by mouth., Disp: , Rfl:  .  Secukinumab (COSENTYX [place]), Inject into the skin every 30 (thirty) days. 2 injections per month, Disp: , Rfl:  .  SYNTHROID 100 MCG tablet, TAKE ONE TABLET BY MOUTH EVERY MORNING BEFORE BREAKFAST, Disp: 90 tablet, Rfl: 0  Allergies  Allergen Reactions  . Ciprofloxacin Hives and Diarrhea  . Oxycodone Diarrhea  . Oxycodone-Acetaminophen Diarrhea  . Oxycodone-Acetaminophen Diarrhea  . Sulfa Antibiotics Rash    Throat, mouth Throat, mouth     Review of Systems  Constitutional: Positive for fatigue and irritability.  Eyes: Negative for blurred vision.  Respiratory: Negative for shortness of breath.   Cardiovascular: Negative for chest pain and palpitations.  Gastrointestinal: Negative for constipation.  Neurological: Positive for headaches.  Endo/Heme/Allergies: Negative for cold intolerance.  Psychiatric/Behavioral: The patient is nervous/anxious and has insomnia.      Objective  Vitals:   04/09/16 0936  BP: 122/73  Pulse: (!) 115  Resp: 17  Temp: 97.7 F (36.5 C)  TempSrc: Oral  SpO2: 94%  Weight: 188 lb 6.4 oz (85.5 kg)  Height: 5\' 3"  (1.6 m)    Physical Exam  Constitutional: She is oriented to person, place, and time and well-developed, well-nourished, and in no distress.  HENT:  Head: Normocephalic and atraumatic.  Cardiovascular:  Normal rate, regular rhythm, S1 normal, S2 normal and normal heart sounds.   No murmur heard. Pulmonary/Chest: Effort normal and breath sounds normal. She has no wheezes.  Neurological: She is alert and oriented to person, place, and time.  Psychiatric: Memory, affect and judgment normal. Her mood appears anxious.  Nursing note and vitals reviewed.      Assessment & Plan  1. Insomnia due to other mental disorder Stable on alprazolam 1 mg taken at bedtime, refills provided - ALPRAZolam (XANAX) 1 MG tablet; Take 1 tablet (1 mg total) by mouth at bedtime as needed for anxiety.  Dispense: 30 tablet; Refill: 0  2. Panic disorder without agoraphobia Symptoms stable but recurrent, relieved with alprazolam, refills provided - ALPRAZolam (XANAX) 0.5 MG tablet; Take 1 tablet (0.5 mg total) by mouth 3 (three) times daily as needed for anxiety.  Dispense: 90 tablet; Refill: 0  3. Benign  hypertension  - lisinopril (PRINIVIL,ZESTRIL) 10 MG tablet; Take 0.5 tablets (5 mg total) by mouth every morning.  Dispense: 90 tablet; Refill: 0  4. Acquired hypothyroidism  - TSH - levothyroxine (SYNTHROID) 100 MCG tablet; Take 1 tablet (100 mcg total) by mouth daily with breakfast.  Dispense: 90 tablet; Refill: 1   Ernan Runkles [facility] 04/09/2016 9:54 AM

## 2016-04-24 ENCOUNTER — Other Ambulatory Visit: Payer: Self-pay | Admitting: Family Medicine

## 2016-04-24 DIAGNOSIS — J441 Chronic obstructive pulmonary disease with (acute) exacerbation: Secondary | ICD-10-CM

## 2016-05-07 ENCOUNTER — Ambulatory Visit (INDEPENDENT_AMBULATORY_CARE_PROVIDER_SITE_OTHER): Payer: PPO | Admitting: Family Medicine

## 2016-05-07 ENCOUNTER — Ambulatory Visit: Payer: PPO

## 2016-05-07 VITALS — BP 164/54 | HR 108 | Temp 98.0°F | Ht 63.0 in | Wt 185.6 lb

## 2016-05-07 DIAGNOSIS — Z0000 Encounter for general adult medical examination without abnormal findings: Secondary | ICD-10-CM

## 2016-05-07 DIAGNOSIS — M25552 Pain in left hip: Secondary | ICD-10-CM

## 2016-05-07 DIAGNOSIS — F99 Mental disorder, not otherwise specified: Secondary | ICD-10-CM | POA: Diagnosis not present

## 2016-05-07 DIAGNOSIS — G8929 Other chronic pain: Secondary | ICD-10-CM | POA: Insufficient documentation

## 2016-05-07 DIAGNOSIS — F5105 Insomnia due to other mental disorder: Secondary | ICD-10-CM | POA: Diagnosis not present

## 2016-05-07 DIAGNOSIS — M1990 Unspecified osteoarthritis, unspecified site: Secondary | ICD-10-CM | POA: Insufficient documentation

## 2016-05-07 DIAGNOSIS — F410 Panic disorder [episodic paroxysmal anxiety] without agoraphobia: Secondary | ICD-10-CM | POA: Diagnosis not present

## 2016-05-07 MED ORDER — ALPRAZOLAM 1 MG PO TABS: 1.0000 mg | ORAL_TABLET | Freq: Every evening | ORAL | 0 refills | Status: DC | PRN

## 2016-05-07 MED ORDER — ALPRAZOLAM 0.5 MG PO TABS: 0.5000 mg | ORAL_TABLET | Freq: Three times a day (TID) | ORAL | 0 refills | Status: DC | PRN

## 2016-05-07 NOTE — Patient Instructions (Signed)
[name] , Thank you for taking time to come for your Medicare Wellness Visit. I appreciate your ongoing commitment to your health goals. Please review the following plan we discussed and let me know if I can assist you in the future.   Screening recommendations/referrals: Colonoscopy: declined Mammogram: to be done this year (2018) Bone Density: declined Recommended yearly ophthalmology/optometry visit for glaucoma screening and checkup Recommended yearly dental visit for hygiene and checkup  Vaccinations: Influenza vaccine: done 11/14/15 Pneumococcal vaccine: completed Pneumovax 23, unable to get Prevnar13 Tdap vaccine: done 02/06/2011   Advanced directives: declined today  Conditions/risks identified: fall risk prevention  Next appointment: None   Preventive Care 67 Years and Older, Female Preventive care refers to lifestyle choices and visits with your health care provider that can promote health and wellness. What does preventive care include?  A yearly physical exam. This is also called an annual well check.  Dental exams once or twice a year.  Routine eye exams. Ask your health care provider how often you should have your eyes checked.  Personal lifestyle choices, including:  Daily care of your teeth and gums.  Regular physical activity.  Eating a healthy diet.  Avoiding tobacco and drug use.  Limiting alcohol use.  Practicing safe sex.  Taking low-dose aspirin every day.  Taking vitamin and mineral supplements as recommended by your health care provider. What happens during an annual well check? The services and screenings done by your health care provider during your annual well check will depend on your age, overall health, lifestyle risk factors, and family history of disease. Counseling  Your health care provider may ask you questions about your:  Alcohol use.  Tobacco use.  Drug use.  Emotional well-being.  Home and relationship  well-being.  Sexual activity.  Eating habits.  History of falls.  Memory and ability to understand (cognition).  Work and work Statistician.  Reproductive health. Screening  You may have the following tests or measurements:  Height, weight, and BMI.  Blood pressure.  Lipid and cholesterol levels. These may be checked every 5 years, or more frequently if you are over 45 years old.  Skin check.  Lung cancer screening. You may have this screening every year starting at age 51 if you have a 30-pack-year history of smoking and currently smoke or have quit within the past 15 years.  Fecal occult blood test (FOBT) of the stool. You may have this test every year starting at age 47.  Flexible sigmoidoscopy or colonoscopy. You may have a sigmoidoscopy every 5 years or a colonoscopy every 10 years starting at age 74.  Hepatitis C blood test.  Hepatitis B blood test.  Sexually transmitted disease (STD) testing.  Diabetes screening. This is done by checking your blood sugar (glucose) after you have not eaten for a while (fasting). You may have this done every 1-3 years.  Bone density scan. This is done to screen for osteoporosis. You may have this done starting at age 45.  Mammogram. This may be done every 1-2 years. Talk to your health care provider about how often you should have regular mammograms. Talk with your health care provider about your test results, treatment options, and if necessary, the need for more tests. Vaccines  Your health care provider may recommend certain vaccines, such as:  Influenza vaccine. This is recommended every year.  Tetanus, diphtheria, and acellular pertussis (Tdap, Td) vaccine. You may need a Td booster every 10 years.  Zoster vaccine. You may  need this after age 25.  Pneumococcal 13-valent conjugate (PCV13) vaccine. One dose is recommended after age 29.  Pneumococcal polysaccharide (PPSV23) vaccine. One dose is recommended after age  39. Talk to your health care provider about which screenings and vaccines you need and how often you need them. This information is not intended to replace advice given to you by your health care provider. Make sure you discuss any questions you have with your health care provider. Document Released: 02/18/2015 Document Revised: 10/12/2015 Document Reviewed: 11/23/2014 Elsevier Interactive Patient Education  2017 [place] Prevention in the Home Falls can cause injuries. They can happen to people of all ages. There are many things you can do to make your home safe and to help prevent falls. What can I do on the outside of my home?  Regularly fix the edges of walkways and driveways and fix any cracks.  Remove anything that might make you trip as you walk through a door, such as a raised step or threshold.  Trim any bushes or trees on the path to your home.  Use bright outdoor lighting.  Clear any walking paths of anything that might make someone trip, such as rocks or tools.  Regularly check to see if handrails are loose or broken. Make sure that both sides of any steps have handrails.  Any raised decks and porches should have guardrails on the edges.  Have any leaves, snow, or ice cleared regularly.  Use sand or salt on walking paths during winter.  Clean up any spills in your garage right away. This includes oil or grease spills. What can I do in the bathroom?  Use night lights.  Install grab bars by the toilet and in the tub and shower. Do not use towel bars as grab bars.  Use non-skid mats or decals in the tub or shower.  If you need to sit down in the shower, use a plastic, non-slip stool.  Keep the floor dry. Clean up any water that spills on the floor as soon as it happens.  Remove soap buildup in the tub or shower regularly.  Attach bath mats securely with double-sided non-slip rug tape.  Do not have throw rugs and other things on the floor that can make  you trip. What can I do in the bedroom?  Use night lights.  Make sure that you have a light by your bed that is easy to reach.  Do not use any sheets or blankets that are too big for your bed. They should not hang down onto the floor.  Have a firm chair that has side arms. You can use this for support while you get dressed.  Do not have throw rugs and other things on the floor that can make you trip. What can I do in the kitchen?  Clean up any spills right away.  Avoid walking on wet floors.  Keep items that you use a lot in easy-to-reach places.  If you need to reach something above you, use a strong step stool that has a grab bar.  Keep electrical cords out of the way.  Do not use floor polish or wax that makes floors slippery. If you must use wax, use non-skid floor wax.  Do not have throw rugs and other things on the floor that can make you trip. What can I do with my stairs?  Do not leave any items on the stairs.  Make sure that there are handrails on both sides  of the stairs and use them. Fix handrails that are broken or loose. Make sure that handrails are as long as the stairways.  Check any carpeting to make sure that it is firmly attached to the stairs. Fix any carpet that is loose or worn.  Avoid having throw rugs at the top or bottom of the stairs. If you do have throw rugs, attach them to the floor with carpet tape.  Make sure that you have a light switch at the top of the stairs and the bottom of the stairs. If you do not have them, ask someone to add them for you. What else can I do to help prevent falls?  Wear shoes that:  Do not have high heels.  Have rubber bottoms.  Are comfortable and fit you well.  Are closed at the toe. Do not wear sandals.  If you use a stepladder:  Make sure that it is fully opened. Do not climb a closed stepladder.  Make sure that both sides of the stepladder are locked into place.  Ask someone to hold it for you, if  possible.  Clearly mark and make sure that you can see:  Any grab bars or handrails.  First and last steps.  Where the edge of each step is.  Use tools that help you move around (mobility aids) if they are needed. These include:  Canes.  Walkers.  Scooters.  Crutches.  Turn on the lights when you go into a dark area. Replace any light bulbs as soon as they burn out.  Set up your furniture so you have a clear path. Avoid moving your furniture around.  If any of your floors are uneven, fix them.  If there are any pets around you, be aware of where they are.  Review your medicines with your doctor. Some medicines can make you feel dizzy. This can increase your chance of falling. Ask your doctor what other things that you can do to help prevent falls. This information is not intended to replace advice given to you by your health care provider. Make sure you discuss any questions you have with your health care provider. Document Released: 11/18/2008 Document Revised: 06/30/2015 Document Reviewed: 02/26/2014 Elsevier Interactive Patient Education  2017 [employer].

## 2016-05-07 NOTE — Progress Notes (Signed)
Subjective:   [name] is a 66 y.o. female who presents for Medicare Annual (Subsequent) preventive examination.  Review of Systems:  N/A  Cardiac Risk Factors include: advanced age (>19men, >67 women);dyslipidemia;hypertension;obesity (BMI >30kg/m2);smoking/ tobacco exposure     Objective:     Vitals: BP (!) 164/54 (BP Location: Left Arm)   Pulse (!) 108   Temp 98 F (36.7 C) (Oral)   Ht 5\' 3"  (1.6 m)   Wt 185 lb 9.6 oz (84.2 kg)   SpO2 93%   BMI 32.88 kg/m   Body mass index is 32.88 kg/m.   Tobacco History  Smoking Status  . Current Every Day Smoker  . Packs/day: 1.00  . Types: Cigarettes  Smokeless Tobacco  . Never Used     Ready to quit: No Counseling given: No   Past Medical History:  Diagnosis Date  . Anxiety   . Arthritis   . Chronic back pain   . Depression   . GERD (gastroesophageal reflux disease)   . Hypertension   . Hypothyroidism   . Pneumonia    RECENT X 3  . PONV (postoperative nausea and vomiting)   . Psoriasis   . Thyroid disease    Past Surgical History:  Procedure Laterality Date  . ABDOMINAL HYSTERECTOMY     heavy bleeding  . bladder stimulator    . CARPAL TUNNEL RELEASE    . CATARACT EXTRACTION W/PHACO Right 03/07/2015   Procedure: CATARACT EXTRACTION PHACO AND INTRAOCULAR LENS PLACEMENT (IOC);  Surgeon: Estill Cotta, MD;  Location: [facility];  Service: Ophthalmology;  Laterality: Right;  [name] 01:14 AP% 26.4 CDE 33.04 fluid pack lot # [id number]  . CATARACT EXTRACTION W/PHACO Left 03/21/2015   Procedure: CATARACT EXTRACTION PHACO AND INTRAOCULAR LENS PLACEMENT (IOC);  Surgeon: Estill Cotta, MD;  Location: [facility];  Service: Ophthalmology;  Laterality: Left;  [name] 01:21 AP% 25.6 CDE 39.78 fluid pack lot # [id number]  . HAND SURGERY Right   . INTERSTIM IMPLANT REMOVAL N/A 09/07/2015   Procedure: REMOVAL OF INTERSTIM IMPLANT;  Surgeon: Bjorn Loser, MD;  Location: [facility] OR[facility];  Service: Urology;  Laterality: N/A;  .  OOPHORECTOMY Right    ovarian cyst   Family History  Problem Relation Age of Onset  . Heart disease Mother   . COPD Father    History  Sexual Activity  . Sexual activity: No    Outpatient Encounter Prescriptions as of 05/07/2016  Medication Sig  . acetaminophen (TYLENOL) 500 MG tablet Take 500 mg by mouth every 6 (six) hours as needed for moderate pain. Reported on 07/01/2015  . albuterol (PROVENTIL HFA;VENTOLIN HFA) 108 (90 Base) MCG/ACT inhaler Inhale 2 puffs into the lungs every 6 (six) hours as needed for wheezing or shortness of breath.  . ALPRAZolam (XANAX) 0.5 MG tablet Take 1 tablet (0.5 mg total) by mouth 3 (three) times daily as needed for anxiety.  . ALPRAZolam (XANAX) 1 MG tablet Take 1 tablet (1 mg total) by mouth at bedtime as needed for anxiety.  Marland Kitchen BREO ELLIPTA 100-25 MCG/INH AEPB INHALE 1 PUFF BY MOUTH INTO THE LUNGS ONCE DAILY  . citalopram (CELEXA) 20 MG tablet Take 1 tablet (20 mg total) by mouth daily.  Marland Kitchen diltiazem (CARDIZEM CD) 120 MG 24 hr capsule Take 1 capsule (120 mg total) by mouth daily.  . hydrOXYzine (ATARAX/VISTARIL) 25 MG tablet   . levothyroxine (SYNTHROID) 100 MCG tablet Take 1 tablet (100 mcg total) by mouth daily with breakfast.  . lisinopril (PRINIVIL,ZESTRIL) 10  MG tablet Take 0.5 tablets (5 mg total) by mouth every morning.  Marland Kitchen omeprazole (PRILOSEC) 20 MG capsule Take 1 capsule (20 mg total) by mouth daily.  . pravastatin (PRAVACHOL) 20 MG tablet Take by mouth.  . Secukinumab (COSENTYX [place]) Inject into the skin every 30 (thirty) days. 2 injections per month  . betamethasone dipropionate (DIPROLENE) 0.05 % ointment   . levalbuterol (XOPENEX) 1.25 MG/0.5ML nebulizer solution Take 1.25 mg by nebulization every 6 (six) hours. (Patient not taking: Reported on 05/07/2016)  . triamcinolone (KENALOG) 0.025 % ointment    No facility-administered encounter medications on file as of 05/07/2016.     Activities of Daily Living In your present state of health, do  you have any difficulty performing the following activities: 05/07/2016 04/09/2016  Hearing? N N  Vision? N Y  Difficulty concentrating or making decisions? N N  Walking or climbing stairs? Y N  Dressing or bathing? N N  Doing errands, shopping? N N  Preparing Food and eating ? N -  Using the Toilet? N -  In the past six months, have you accidently leaked urine? Y -  Do you have problems with loss of bowel control? N -  Managing your Medications? N -  Managing your Finances? N -  Housekeeping or managing your Housekeeping? N -  Some recent data might be hidden    Patient Care Team: Roselee Nova, MD as PCP - General (Family Medicine) Estill Cotta, MD as Consulting Physician (Ophthalmology) Jannet Mantis, MD as Consulting Physician (Dermatology) Corey Skains, MD as Consulting Physician (Cardiology)    Assessment:     Exercise Activities and Dietary recommendations Current Exercise Habits: Home exercise routine, Type of exercise: walking, Time (Minutes): 15, Frequency (Times/Week): 7, Weekly Exercise (Minutes/Week): 105, Intensity: Mild, Exercise limited by: orthopedic condition(s)  Goals    . Increase water intake          Recommend increasing water intake to 4 glasses of water a day.      Fall Risk Fall Risk  05/07/2016 04/09/2016 02/10/2016 01/10/2016 12/13/2015  Falls in the past year? Yes No No No No  Number falls in past yr: 2 or more - - - -  Injury with Fall? No - - - -  Risk for fall due to : Other (Comment) - - - -  Risk for fall due to (comments): left hip locks up - - - -  Follow up Falls prevention discussed - - - -   Depression Screen PHQ 2/9 Scores 05/07/2016 04/09/2016 02/10/2016 01/10/2016  PHQ - 2 Score 1 0 0 0  PHQ- 9 Score - - - -  Exception Documentation - - - -     Cognitive Function     6CIT Screen 05/07/2016  What Year? 0 points  What month? 0 points  What time? 0 points  Count back from 20 0 points  Months in reverse 0 points  Repeat  phrase 4 points  Total Score 4    Immunization History  Administered Date(s) Administered  . Influenza, High Dose Seasonal PF 01/07/2015, 11/14/2015  . Influenza, Seasonal, Injecte, Preservative Fre 10/27/2008, 12/13/2010, 11/05/2012  . Influenza,inj,Quad PF,36+ Mos 10/26/2013  . Influenza-Unspecified 10/06/2013  . Pneumococcal Polysaccharide-23 11/25/2013   Screening Tests Health Maintenance  Topic Date Due  . MAMMOGRAM  07/06/2016 (Originally 02/27/1999)  . DEXA SCAN  04/05/2017 (Originally 02/26/2014)  . Hepatitis C Screening  04/05/2017 (Originally September 09, 1950)  . PNA vac Low Risk Adult (1 of  2 - PCV13) 04/05/2017 (Originally 02/26/2014)  . COLONOSCOPY  02/05/2026 (Originally 02/27/1999)  . INFLUENZA VACCINE  09/05/2016  . TETANUS/TDAP  02/05/2021      Plan:  I have personally reviewed and addressed the Medicare Annual Wellness questionnaire and have noted the following in the patient's chart:  A. Medical and social history B. Use of alcohol, tobacco or illicit drugs  C. Current medications and supplements D. Functional ability and status E.  Nutritional status F.  Physical activity G. Advance directives H. List of other physicians I.  Hospitalizations, surgeries, and ER visits in previous 12 months J.  [place] such as hearing and vision if needed, cognitive and depression L. Referrals and appointments - none  In addition, I have reviewed and discussed with patient certain preventive protocols, quality metrics, and best practice recommendations. A written personalized care plan for preventive services as well as general preventive health recommendations were provided to patient.  See attached scanned questionnaire for additional information.   Signed,  Fabio Neighbors, LPN Nurse Health Advisor   MD Recommendations: None. Pt declined DEXA order, Prevnar 13 and colonoscopy referral today. Pt will have a mammogram done this year.   I, as supervising  physician, have reviewed the nurse health advisor's Medicare Wellness Visit note for this patient and concur with the findings and recommendations listed above.  Signed Syed Asad A. Manuella Ghazi MD Attending Physician.

## 2016-05-07 NOTE — Progress Notes (Signed)
Name: [name]   MRN: [id number]    DOB: 23-Mar-1950   Date:05/08/2016       Progress Note  Subjective  Chief Complaint  No chief complaint on file.   Hip Pain   There was no injury mechanism. The pain is present in the left hip. The quality of the pain is described as aching (sharp pain). The pain is at a severity of 10/10. Pertinent negatives include no inability to bear weight. The symptoms are aggravated by movement, palpation and weight bearing (walking makes it worse).  Anxiety  Presents for follow-up visit. Symptoms include excessive worry, insomnia, irritability, muscle tension, nervous/anxious behavior, panic and shortness of breath (has been recovering from COPD exacerbation). Patient reports no depressed mood. Primary symptoms comment: recently worse with death of her uncle and worsening left hip pain. The severity of symptoms is moderate and causing significant distress.    Insomnia  Primary symptoms: frequent awakening, premature morning awakening.  The onset quality is gradual. The symptoms are aggravated by anxiety and pain. Past treatments include medication. Typical bedtime:  8-10 P.M..  How long after going to bed to you fall asleep: 15-30 minutes.   PMH includes: hypertension, depression, family stress or anxiety, chronic pain.    Past Medical History:  Diagnosis Date  . Anxiety   . Arthritis   . Chronic back pain   . Depression   . GERD (gastroesophageal reflux disease)   . Hypertension   . Hypothyroidism   . Pneumonia    RECENT X 3  . PONV (postoperative nausea and vomiting)   . Psoriasis   . Thyroid disease     Past Surgical History:  Procedure Laterality Date  . ABDOMINAL HYSTERECTOMY     heavy bleeding  . bladder stimulator    . CARPAL TUNNEL RELEASE    . CATARACT EXTRACTION W/PHACO Right 03/07/2015   Procedure: CATARACT EXTRACTION PHACO AND INTRAOCULAR LENS PLACEMENT (IOC);  Surgeon: Estill Cotta, MD;  Location: [facility];  Service:  Ophthalmology;  Laterality: Right;  [name] 01:14 AP% 26.4 CDE 33.04 fluid pack lot # [id number]  . CATARACT EXTRACTION W/PHACO Left 03/21/2015   Procedure: CATARACT EXTRACTION PHACO AND INTRAOCULAR LENS PLACEMENT (IOC);  Surgeon: Estill Cotta, MD;  Location: [facility];  Service: Ophthalmology;  Laterality: Left;  [name] 01:21 AP% 25.6 CDE 39.78 fluid pack lot # [id number]  . HAND SURGERY Right   . INTERSTIM IMPLANT REMOVAL N/A 09/07/2015   Procedure: REMOVAL OF INTERSTIM IMPLANT;  Surgeon: Bjorn Loser, MD;  Location: [facility];  Service: Urology;  Laterality: N/A;  . OOPHORECTOMY Right    ovarian cyst    Family History  Problem Relation Age of Onset  . Heart disease Mother   . COPD Father     Social History   Social History  . Marital status: Widowed    Spouse name: N/A  . Number of children: N/A  . Years of education: N/A   Occupational History  . Not on file.   Social History Main Topics  . Smoking status: Current Every Day Smoker    Packs/day: 1.00    Types: Cigarettes  . Smokeless tobacco: Never Used  . Alcohol use No  . Drug use: No  . Sexual activity: No   Other Topics Concern  . Not on file   Social History Narrative  . No narrative on file     Current Outpatient Prescriptions:  .  acetaminophen (TYLENOL) 500 MG tablet, Take 500 mg by mouth every 6 (  six) hours as needed for moderate pain. Reported on 07/01/2015, Disp: , Rfl:  .  albuterol (PROVENTIL HFA;VENTOLIN HFA) 108 (90 Base) MCG/ACT inhaler, Inhale 2 puffs into the lungs every 6 (six) hours as needed for wheezing or shortness of breath., Disp: 1 Inhaler, Rfl: 2 .  ALPRAZolam (XANAX) 0.5 MG tablet, Take 1 tablet (0.5 mg total) by mouth 3 (three) times daily as needed for anxiety., Disp: 90 tablet, Rfl: 0 .  ALPRAZolam (XANAX) 1 MG tablet, Take 1 tablet (1 mg total) by mouth at bedtime as needed for anxiety., Disp: 30 tablet, Rfl: 0 .  betamethasone dipropionate (DIPROLENE) 0.05 % ointment, , Disp: , Rfl:  .   BREO ELLIPTA 100-25 MCG/INH AEPB, INHALE 1 PUFF BY MOUTH INTO THE LUNGS ONCE DAILY, Disp: 60 each, Rfl: 0 .  citalopram (CELEXA) 20 MG tablet, Take 1 tablet (20 mg total) by mouth daily., Disp: 90 tablet, Rfl: 0 .  diltiazem (CARDIZEM CD) 120 MG 24 hr capsule, Take 1 capsule (120 mg total) by mouth daily., Disp: 90 capsule, Rfl: 0 .  hydrOXYzine (ATARAX/VISTARIL) 25 MG tablet, , Disp: , Rfl:  .  levalbuterol (XOPENEX) 1.25 MG/0.5ML nebulizer solution, Take 1.25 mg by nebulization every 6 (six) hours. (Patient not taking: Reported on 05/07/2016), Disp: 1 each, Rfl: 12 .  levothyroxine (SYNTHROID) 100 MCG tablet, Take 1 tablet (100 mcg total) by mouth daily with breakfast., Disp: 90 tablet, Rfl: 1 .  lisinopril (PRINIVIL,ZESTRIL) 10 MG tablet, Take 0.5 tablets (5 mg total) by mouth every morning., Disp: 90 tablet, Rfl: 0 .  omeprazole (PRILOSEC) 20 MG capsule, Take 1 capsule (20 mg total) by mouth daily., Disp: 30 capsule, Rfl: 0 .  pravastatin (PRAVACHOL) 20 MG tablet, Take by mouth., Disp: , Rfl:  .  Secukinumab (COSENTYX [place]), Inject into the skin every 30 (thirty) days. 2 injections per month, Disp: , Rfl:  .  triamcinolone (KENALOG) 0.025 % ointment, , Disp: , Rfl:   Allergies  Allergen Reactions  . Ciprofloxacin Hives and Diarrhea  . Oxycodone Diarrhea  . Oxycodone-Acetaminophen Diarrhea  . Oxycodone-Acetaminophen Diarrhea  . Sulfa Antibiotics Rash    Throat, mouth Throat, mouth     Review of Systems  Constitutional: Positive for irritability.  Respiratory: Positive for shortness of breath (has been recovering from COPD exacerbation).   Psychiatric/Behavioral: Positive for depression. The patient is nervous/anxious and has insomnia.      Objective  There were no vitals filed for this visit.  Physical Exam  Constitutional: She is oriented to person, place, and time and well-developed, well-nourished, and in no distress.  Cardiovascular: Normal rate, regular rhythm and normal  heart sounds.   No murmur heard. Pulmonary/Chest: Effort normal and breath sounds normal. She has no wheezes.  Musculoskeletal:       Left hip: She exhibits tenderness.       Legs: Neurological: She is alert and oriented to person, place, and time.  Nursing note and vitals reviewed.       Assessment & Plan  1. Insomnia due to other mental disorder Stable, responsive to alprazolam taken every night, refills provided - ALPRAZolam (XANAX) 1 MG tablet; Take 1 tablet (1 mg total) by mouth at bedtime as needed for anxiety.  Dispense: 30 tablet; Refill: 0  2. Chronic left hip pain  - DG HIP UNILAT WITH PELVIS 2-3 VIEWS LEFT; Future  3. Panic disorder without agoraphobia  - ALPRAZolam (XANAX) 0.5 MG tablet; Take 1 tablet (0.5 mg total) by mouth 3 (three)  times daily as needed for anxiety.  Dispense: 90 tablet; Refill: 0   Verdean Murin [facility] 05/08/2016 7:24 PM

## 2016-05-08 ENCOUNTER — Ambulatory Visit
Admission: RE | Admit: 2016-05-08 | Discharge: 2016-05-08 | Disposition: A | Discharge status: Home / Self Care | Payer: PPO | Source: Ambulatory Visit | Attending: Family Medicine | Admitting: Family Medicine

## 2016-05-08 DIAGNOSIS — G8929 Other chronic pain: Secondary | ICD-10-CM

## 2016-05-08 DIAGNOSIS — M25552 Pain in left hip: Principal | ICD-10-CM

## 2016-05-10 ENCOUNTER — Telehealth: Payer: Self-pay | Admitting: Family Medicine

## 2016-05-10 DIAGNOSIS — M25552 Pain in left hip: Principal | ICD-10-CM

## 2016-05-10 DIAGNOSIS — G8929 Other chronic pain: Secondary | ICD-10-CM

## 2016-05-10 NOTE — Telephone Encounter (Signed)
Pt would like a call back about her X ray results of her hip.

## 2016-05-11 NOTE — Telephone Encounter (Signed)
Patient notified of xray results. Still having pain. What will the next step be. You had stated she may need a MRI

## 2016-05-11 NOTE — Telephone Encounter (Signed)
Since the x-ray of the hip did not show any acute abnormality, would recommend referral to  an orthopedic surgeon for evaluation and further management.

## 2016-05-15 NOTE — Telephone Encounter (Signed)
Referral has been ordered and sent to [facility].

## 2016-05-15 NOTE — Telephone Encounter (Signed)
Patient notified of referral. Staff will call once complete

## 2016-05-23 ENCOUNTER — Other Ambulatory Visit: Payer: Self-pay | Admitting: Family Medicine

## 2016-05-23 DIAGNOSIS — I10 Essential (primary) hypertension: Secondary | ICD-10-CM

## 2016-05-23 DIAGNOSIS — F3341 Major depressive disorder, recurrent, in partial remission: Secondary | ICD-10-CM

## 2016-06-04 DIAGNOSIS — M1612 Unilateral primary osteoarthritis, left hip: Secondary | ICD-10-CM | POA: Diagnosis not present

## 2016-06-04 DIAGNOSIS — M5442 Lumbago with sciatica, left side: Secondary | ICD-10-CM | POA: Diagnosis not present

## 2016-06-04 DIAGNOSIS — M7062 Trochanteric bursitis, left hip: Secondary | ICD-10-CM | POA: Diagnosis not present

## 2016-06-06 ENCOUNTER — Encounter: Payer: Self-pay | Admitting: Family Medicine

## 2016-06-06 ENCOUNTER — Ambulatory Visit (INDEPENDENT_AMBULATORY_CARE_PROVIDER_SITE_OTHER): Payer: PPO | Admitting: Family Medicine

## 2016-06-06 VITALS — BP 142/70 | HR 105 | Temp 97.7°F | Resp 17 | Ht 63.0 in | Wt 185.3 lb

## 2016-06-06 DIAGNOSIS — F99 Mental disorder, not otherwise specified: Secondary | ICD-10-CM

## 2016-06-06 DIAGNOSIS — I10 Essential (primary) hypertension: Secondary | ICD-10-CM | POA: Diagnosis not present

## 2016-06-06 DIAGNOSIS — F410 Panic disorder [episodic paroxysmal anxiety] without agoraphobia: Secondary | ICD-10-CM | POA: Diagnosis not present

## 2016-06-06 DIAGNOSIS — F5105 Insomnia due to other mental disorder: Secondary | ICD-10-CM | POA: Diagnosis not present

## 2016-06-06 MED ORDER — ALPRAZOLAM 1 MG PO TABS: 1.0000 mg | ORAL_TABLET | Freq: Every evening | ORAL | 0 refills | Status: DC | PRN

## 2016-06-06 MED ORDER — ALPRAZOLAM 0.5 MG PO TABS: 0.5000 mg | ORAL_TABLET | Freq: Three times a day (TID) | ORAL | 0 refills | Status: DC | PRN

## 2016-06-06 NOTE — Progress Notes (Signed)
Name: [name]   MRN: [id number]    DOB: 03/26/1950   Date:06/06/2016       Progress Note  Subjective  Chief Complaint  Chief Complaint  Patient presents with  . Follow-up    1 mo  . Medication Refill    Anxiety  Presents for follow-up visit. The problem has been unchanged. Symptoms include depressed mood, excessive worry, insomnia, nervous/anxious behavior, panic and shortness of breath. Patient reports no chest pain or palpitations. The severity of symptoms is moderate and causing significant distress.   Her past medical history is significant for anxiety/panic attacks. Past treatments include benzodiazephines.  Hypertension  This is a chronic problem. The problem has been gradually worsening since onset. The problem is controlled. Associated symptoms include anxiety and shortness of breath. Pertinent negatives include no blurred vision, chest pain, headaches, orthopnea or palpitations. Past treatments include calcium channel blockers and ACE inhibitors. There is no history of kidney disease, CAD/MI or CVA.  Insomnia  Primary symptoms: frequent awakening.  The onset quality is gradual. The symptoms are aggravated by pain and anxiety. Typical bedtime:  11-12 P.M..  How long after going to bed to you fall asleep: 15-30 minutes.       Past Medical History:  Diagnosis Date  . Anxiety   . Arthritis   . Chronic back pain   . Depression   . GERD (gastroesophageal reflux disease)   . Hypertension   . Hypothyroidism   . Pneumonia    RECENT X 3  . PONV (postoperative nausea and vomiting)   . Psoriasis   . Thyroid disease     Past Surgical History:  Procedure Laterality Date  . ABDOMINAL HYSTERECTOMY     heavy bleeding  . bladder stimulator    . CARPAL TUNNEL RELEASE    . CATARACT EXTRACTION W/PHACO Right 03/07/2015   Procedure: CATARACT EXTRACTION PHACO AND INTRAOCULAR LENS PLACEMENT (IOC);  Surgeon: Estill Cotta, MD;  Location: [facility];  Service: Ophthalmology;   Laterality: Right;  [name] 01:14 AP% 26.4 CDE 33.04 fluid pack lot # [id number]  . CATARACT EXTRACTION W/PHACO Left 03/21/2015   Procedure: CATARACT EXTRACTION PHACO AND INTRAOCULAR LENS PLACEMENT (IOC);  Surgeon: Estill Cotta, MD;  Location: [facility];  Service: Ophthalmology;  Laterality: Left;  [name] 01:21 AP% 25.6 CDE 39.78 fluid pack lot # [id number]  . HAND SURGERY Right   . INTERSTIM IMPLANT REMOVAL N/A 09/07/2015   Procedure: REMOVAL OF INTERSTIM IMPLANT;  Surgeon: Bjorn Loser, MD;  Location: [facility];  Service: Urology;  Laterality: N/A;  . OOPHORECTOMY Right    ovarian cyst    Family History  Problem Relation Age of Onset  . Heart disease Mother   . COPD Father     Social History   Social History  . Marital status: Widowed    Spouse name: N/A  . Number of children: N/A  . Years of education: N/A   Occupational History  . Not on file.   Social History Main Topics  . Smoking status: Current Every Day Smoker    Packs/day: 1.00    Types: Cigarettes  . Smokeless tobacco: Never Used  . Alcohol use No  . Drug use: No  . Sexual activity: No   Other Topics Concern  . Not on file   Social History Narrative  . No narrative on file     Current Outpatient Prescriptions:  .  acetaminophen (TYLENOL) 500 MG tablet, Take 500 mg by mouth every 6 (six) hours as needed  for moderate pain. Reported on 07/01/2015, Disp: , Rfl:  .  albuterol (PROVENTIL HFA;VENTOLIN HFA) 108 (90 Base) MCG/ACT inhaler, Inhale 2 puffs into the lungs every 6 (six) hours as needed for wheezing or shortness of breath., Disp: 1 Inhaler, Rfl: 2 .  ALPRAZolam (XANAX) 0.5 MG tablet, Take 1 tablet (0.5 mg total) by mouth 3 (three) times daily as needed for anxiety., Disp: 90 tablet, Rfl: 0 .  ALPRAZolam (XANAX) 1 MG tablet, Take 1 tablet (1 mg total) by mouth at bedtime as needed for anxiety., Disp: 30 tablet, Rfl: 0 .  betamethasone dipropionate (DIPROLENE) 0.05 % ointment, , Disp: , Rfl:  .  BREO ELLIPTA  100-25 MCG/INH AEPB, INHALE 1 PUFF BY MOUTH INTO THE LUNGS ONCE DAILY, Disp: 60 each, Rfl: 0 .  citalopram (CELEXA) 20 MG tablet, TAKE ONE TABLET BY MOUTH EVERY DAY, Disp: 90 tablet, Rfl: 0 .  diltiazem (CARDIZEM CD) 120 MG 24 hr capsule, TAKE ONE CAPSULE BY MOUTH EVERY DAY, Disp: 90 capsule, Rfl: 0 .  hydrOXYzine (ATARAX/VISTARIL) 25 MG tablet, , Disp: , Rfl:  .  levalbuterol (XOPENEX) 1.25 MG/0.5ML nebulizer solution, Take 1.25 mg by nebulization every 6 (six) hours., Disp: 1 each, Rfl: 12 .  levothyroxine (SYNTHROID) 100 MCG tablet, Take 1 tablet (100 mcg total) by mouth daily with breakfast., Disp: 90 tablet, Rfl: 1 .  lisinopril (PRINIVIL,ZESTRIL) 10 MG tablet, Take 0.5 tablets (5 mg total) by mouth every morning., Disp: 90 tablet, Rfl: 0 .  omeprazole (PRILOSEC) 20 MG capsule, Take 1 capsule (20 mg total) by mouth daily., Disp: 30 capsule, Rfl: 0 .  pravastatin (PRAVACHOL) 20 MG tablet, Take by mouth., Disp: , Rfl:  .  Secukinumab (COSENTYX [place]), Inject into the skin every 30 (thirty) days. 2 injections per month, Disp: , Rfl:  .  triamcinolone (KENALOG) 0.025 % ointment, , Disp: , Rfl:   Allergies  Allergen Reactions  . Ciprofloxacin Hives and Diarrhea  . Oxycodone Diarrhea  . Oxycodone-Acetaminophen Diarrhea  . Oxycodone-Acetaminophen Diarrhea  . Sulfa Antibiotics Rash    Throat, mouth Throat, mouth     Review of Systems  Eyes: Negative for blurred vision.  Respiratory: Positive for shortness of breath.   Cardiovascular: Negative for chest pain, palpitations and orthopnea.  Neurological: Negative for headaches.  Psychiatric/Behavioral: The patient is nervous/anxious and has insomnia.       Objective  Vitals:   06/06/16 1000  BP: (!) 148/72  Pulse: (!) 105  Resp: 17  Temp: 97.7 F (36.5 C)  TempSrc: Oral  SpO2: 93%  Weight: 185 lb 4.8 oz (84.1 kg)  Height: 5\' 3"  (1.6 m)    Physical Exam  Constitutional: She is oriented to person, place, and time and  well-developed, well-nourished, and in no distress.  Cardiovascular: Regular rhythm, S1 normal, S2 normal and normal heart sounds.  Tachycardia present.   Pulmonary/Chest: Effort normal and breath sounds normal. She has no decreased breath sounds.  Abdominal: Soft. Bowel sounds are normal.  Musculoskeletal:       Right ankle: She exhibits no swelling.       Left ankle: She exhibits no swelling.  Neurological: She is alert and oriented to person, place, and time.  Psychiatric: Memory, affect and judgment normal. Her mood appears anxious.  Nursing note and vitals reviewed.     Assessment & Plan  1. Insomnia due to other mental disorder Stable, continue on alprazolam at bedtime as needed - ALPRAZolam (XANAX) 1 MG tablet; Take 1 tablet (1 mg  total) by mouth at bedtime as needed for anxiety.  Dispense: 30 tablet; Refill: 0  2. Panic disorder without agoraphobia Stable, continue alprazolam 3 times daily as needed for symptoms of anxiety and panic attacks - ALPRAZolam (XANAX) 0.5 MG tablet; Take 1 tablet (0.5 mg total) by mouth 3 (three) times daily as needed for anxiety.  Dispense: 90 tablet; Refill: 0  3. Benign essential HTN Repeat blood pressure is improved, will continue present pharmacotherapy as patient reports being in a lot of pain from her hip and also anxiety. Reassess in one month  Kamar Callender [facility] 06/06/2016 10:09 AM

## 2016-06-27 ENCOUNTER — Encounter: Payer: Self-pay | Admitting: Urology

## 2016-06-27 ENCOUNTER — Ambulatory Visit: Payer: PPO | Admitting: Urology

## 2016-06-27 VITALS — BP 101/63 | HR 108 | Ht 63.0 in | Wt 185.8 lb

## 2016-06-27 DIAGNOSIS — L408 Other psoriasis: Secondary | ICD-10-CM | POA: Diagnosis not present

## 2016-06-27 DIAGNOSIS — R350 Frequency of micturition: Secondary | ICD-10-CM | POA: Diagnosis not present

## 2016-06-27 DIAGNOSIS — N3946 Mixed incontinence: Secondary | ICD-10-CM | POA: Diagnosis not present

## 2016-06-27 DIAGNOSIS — R109 Unspecified abdominal pain: Secondary | ICD-10-CM

## 2016-06-27 DIAGNOSIS — Z79899 Other long term (current) drug therapy: Secondary | ICD-10-CM | POA: Diagnosis not present

## 2016-06-27 LAB — URINALYSIS, COMPLETE
BILIRUBIN UA: NEGATIVE
Glucose, UA: NEGATIVE
KETONES UA: NEGATIVE
Nitrite, UA: NEGATIVE
PROTEIN UA: NEGATIVE
SPEC GRAV UA: 1.01 (ref 1.005–1.030)
Urobilinogen, Ur: 0.2 mg/dL (ref 0.2–1.0)
pH, UA: 6 (ref 5.0–7.5)

## 2016-06-27 LAB — MICROSCOPIC EXAMINATION
RBC, UA: NONE SEEN /hpf (ref 0–?)
WBC, UA: >30 /hpf — ABNORMAL HIGH (ref 0–?)

## 2016-06-27 LAB — BLADDER SCAN AMB NON-IMAGING: Scan Result: 72

## 2016-06-27 NOTE — Progress Notes (Signed)
06/27/2016 12:00 PM   [name] 07/07/1950 [id number]  Referring provider: Roselee Nova, MD [address] [place], [place] [address]  Chief Complaint  Patient presents with  . Flank Pain    HPI: F/u for LUTS. She was last seen Aug 2017 following Interstim removal as it was thought it was causing pain. The patient had an InterStim year 2010. It was close to the midline and hurt. The device was on. At the time, she reports that her flow was good. She sometimes gets up once at night. In voids every 2 hours. She wears 2-3 light pads per day and denies urge incontinence.   For the past month she's noticed left flank pain radiating into LLQ when she voids. No burning with urination, more "pain". Pain is moderate to severe. She's noticed foamy urine. No gross hematuria. She's voiding about every hour. She has noc x 2. She voids with a good stream. She leaks every "once in awhile". No UUI. Small leak with laugh or cough. She feels like this is a different pain than the Interstim was causing. No agravating or alleviating symptoms. No associated signs or symptoms.   UA today shows > 30 wbc, many bacteria, no microhematuria. She was recently evaluated for back pain. Her Lumbar xray showed significant DDD.   PVR 72 mL.     PMH: Past Medical History:  Diagnosis Date  . Anxiety   . Arthritis   . Chronic back pain   . Depression   . GERD (gastroesophageal reflux disease)   . Hypertension   . Hypothyroidism   . Pneumonia    RECENT X 3  . PONV (postoperative nausea and vomiting)   . Psoriasis   . Thyroid disease     Surgical History: Past Surgical History:  Procedure Laterality Date  . ABDOMINAL HYSTERECTOMY     heavy bleeding  . bladder stimulator    . CARPAL TUNNEL RELEASE    . CATARACT EXTRACTION W/PHACO Right 03/07/2015   Procedure: CATARACT EXTRACTION PHACO AND INTRAOCULAR LENS PLACEMENT (IOC);  Surgeon: Estill Cotta, MD;  Location: [facility];   Service: Ophthalmology;  Laterality: Right;  [name] 01:14 AP% 26.4 CDE 33.04 fluid pack lot # [id number]  . CATARACT EXTRACTION W/PHACO Left 03/21/2015   Procedure: CATARACT EXTRACTION PHACO AND INTRAOCULAR LENS PLACEMENT (IOC);  Surgeon: Estill Cotta, MD;  Location: [facility];  Service: Ophthalmology;  Laterality: Left;  [name] 01:21 AP% 25.6 CDE 39.78 fluid pack lot # [id number]  . HAND SURGERY Right   . INTERSTIM IMPLANT REMOVAL N/A 09/07/2015   Procedure: REMOVAL OF INTERSTIM IMPLANT;  Surgeon: Bjorn Loser, MD;  Location: [facility];  Service: Urology;  Laterality: N/A;  . OOPHORECTOMY Right    ovarian cyst    Home Medications:  Allergies as of 06/27/2016      Reactions   Ciprofloxacin Hives, Diarrhea   Oxycodone Diarrhea   Oxycodone-acetaminophen Diarrhea   Oxycodone-acetaminophen Diarrhea   Sulfa Antibiotics Rash   Throat, mouth Throat, mouth      Medication List       Accurate as of 06/27/16 12:00 PM. Always use your most recent med list.          acetaminophen 500 MG tablet Commonly known as:  TYLENOL Take 500 mg by mouth every 6 (six) hours as needed for moderate pain. Reported on 07/01/2015   albuterol 108 (90 Base) MCG/ACT inhaler Commonly known as:  PROVENTIL HFA;VENTOLIN HFA Inhale 2 puffs into the lungs every 6 (  six) hours as needed for wheezing or shortness of breath.   ALPRAZolam 1 MG tablet Commonly known as:  XANAX Take 1 tablet (1 mg total) by mouth at bedtime as needed for anxiety.   ALPRAZolam 0.5 MG tablet Commonly known as:  XANAX Take 1 tablet (0.5 mg total) by mouth 3 (three) times daily as needed for anxiety.   betamethasone dipropionate 0.05 % ointment Commonly known as:  DIPROLENE   BREO ELLIPTA 100-25 MCG/INH Aepb Generic drug:  fluticasone furoate-vilanterol INHALE 1 PUFF BY MOUTH INTO THE LUNGS ONCE DAILY   citalopram 20 MG tablet Commonly known as:  CELEXA TAKE ONE TABLET BY MOUTH EVERY DAY   COSENTYX [place] Inject into the skin every  30 (thirty) days. 2 injections per month   diltiazem 120 MG 24 hr capsule Commonly known as:  CARDIZEM CD TAKE ONE CAPSULE BY MOUTH EVERY DAY   hydrOXYzine 25 MG tablet Commonly known as:  ATARAX/VISTARIL   levalbuterol 1.25 MG/0.5ML nebulizer solution Commonly known as:  XOPENEX Take 1.25 mg by nebulization every 6 (six) hours.   levothyroxine 100 MCG tablet Commonly known as:  SYNTHROID Take 1 tablet (100 mcg total) by mouth daily with breakfast.   lisinopril 10 MG tablet Commonly known as:  PRINIVIL,ZESTRIL Take 0.5 tablets (5 mg total) by mouth every morning.   omeprazole 20 MG capsule Commonly known as:  PRILOSEC Take 1 capsule (20 mg total) by mouth daily.   pravastatin 20 MG tablet Commonly known as:  PRAVACHOL Take by mouth.   triamcinolone 0.025 % ointment Commonly known as:  KENALOG       Allergies:  Allergies  Allergen Reactions  . Ciprofloxacin Hives and Diarrhea  . Oxycodone Diarrhea  . Oxycodone-Acetaminophen Diarrhea  . Oxycodone-Acetaminophen Diarrhea  . Sulfa Antibiotics Rash    Throat, mouth Throat, mouth    Family History: Family History  Problem Relation Age of Onset  . Heart disease Mother   . COPD Father     Social History:  reports that she has been smoking Cigarettes.  She has been smoking about 1.00 pack per day. She has never used smokeless tobacco. She reports that she does not drink alcohol or use drugs.  ROS: UROLOGY Frequent Urination?: Yes Hard to postpone urination?: Yes Burning/pain with urination?: Yes Get up at night to urinate?: Yes Leakage of urine?: No Urine stream starts and stops?: No Trouble starting stream?: No Do you have to strain to urinate?: No Blood in urine?: No Urinary tract infection?: Yes Sexually transmitted disease?: No Injury to kidneys or bladder?: No Painful intercourse?: No Weak stream?: No Currently pregnant?: No Vaginal bleeding?: No Last menstrual period?:  n  Gastrointestinal Nausea?: No Vomiting?: No Indigestion/heartburn?: No Diarrhea?: No Constipation?: No  Constitutional Fever: No Night sweats?: No Weight loss?: No Fatigue?: Yes  Skin Skin rash/lesions?: Yes Itching?: No  Eyes Blurred vision?: No Double vision?: No  Ears/Nose/Throat Sore throat?: No Sinus problems?: No  Hematologic/Lymphatic Swollen glands?: No Easy bruising?: No  Cardiovascular Leg swelling?: No Chest pain?: No  Respiratory Cough?: No Shortness of breath?: No  Endocrine Excessive thirst?: Yes  Musculoskeletal Back pain?: Yes Joint pain?: No  Neurological Headaches?: No Dizziness?: No  Psychologic Depression?: Yes Anxiety?: Yes  Physical Exam: BP 101/63 (BP Location: Left Arm, Patient Position: Sitting, Cuff Size: Normal)   Pulse (!) 108   Ht 5\' 3"  (1.6 m)   Wt 84.3 kg (185 lb 12.8 oz)   BMI 32.91 kg/m   Constitutional:  Alert and  oriented, No acute distress. HEENT: [place] AT, moist mucus membranes.  Trachea midline, no masses. Cardiovascular: No clubbing, cyanosis, or edema. Respiratory: Normal respiratory effort, no increased work of breathing. GI: Abdomen is soft, nontender, nondistended, no abdominal masses GU: No CVA tenderness. Skin: No rashes, bruises or suspicious lesions. Neurologic: Grossly intact, no focal deficits, moving all 4 extremities. Psychiatric: Normal mood and affect.  Laboratory Data: Lab Results  Component Value Date   WBC 17.3 (H) 12/20/2015   HGB 13.5 12/20/2015   HCT 41.0 12/20/2015   MCV 92.5 12/20/2015   PLT 218 12/20/2015    Lab Results  Component Value Date   CREATININE 1.00 12/18/2015    No results found for: PSA  No results found for: TESTOSTERONE  Lab Results  Component Value Date   HGBA1C 6.0 (H) 05/12/2015    Urinalysis    Component Value Date/Time   COLORURINE YELLOW (A) 06/22/2014 1634   APPEARANCEUR Cloudy (A) 09/22/2015 1605   LABSPEC 1.020 06/22/2014 1634    LABSPEC 1.012 10/10/2011 1219   PHURINE 5.0 06/22/2014 1634   GLUCOSEU Negative 09/22/2015 1605   GLUCOSEU Negative 10/10/2011 1219   HGBUR NEGATIVE 06/22/2014 1634   BILIRUBINUR Negative 09/22/2015 1605   BILIRUBINUR Negative 10/10/2011 1219   KETONESUR NEGATIVE 06/22/2014 1634   PROTEINUR 3+ (A) 09/22/2015 1605   PROTEINUR 30 (A) 06/22/2014 1634   NITRITE Negative 09/22/2015 1605   NITRITE NEGATIVE 06/22/2014 1634   LEUKOCYTESUR 2+ (A) 09/22/2015 1605   LEUKOCYTESUR 2+ 10/10/2011 1219     Assessment & Plan:    1. Flank pain Chronic, likely not Gu related but we'll eval kidneys with a renal US.  - Urinalysis, Complete  2. Frequency - trial of Vesicare and Myrbetriq. She may be having recurrent symptoms now that the Interstim is out. I did send urine for Cx.    Return for PA or MD in 4-6 weeks .  Festus Aloe, [facility] [address] [place], [place] [address] [phone]

## 2016-06-30 LAB — CULTURE, URINE COMPREHENSIVE

## 2016-07-06 ENCOUNTER — Ambulatory Visit: Payer: Self-pay | Admitting: Family Medicine

## 2016-07-09 ENCOUNTER — Ambulatory Visit (INDEPENDENT_AMBULATORY_CARE_PROVIDER_SITE_OTHER): Payer: PPO | Admitting: Family Medicine

## 2016-07-09 ENCOUNTER — Encounter: Payer: Self-pay | Admitting: Family Medicine

## 2016-07-09 VITALS — BP 105/64 | HR 90 | Temp 97.8°F | Resp 18 | Ht 63.0 in | Wt 182.7 lb

## 2016-07-09 DIAGNOSIS — F99 Mental disorder, not otherwise specified: Secondary | ICD-10-CM | POA: Diagnosis not present

## 2016-07-09 DIAGNOSIS — R739 Hyperglycemia, unspecified: Secondary | ICD-10-CM | POA: Diagnosis not present

## 2016-07-09 DIAGNOSIS — F5105 Insomnia due to other mental disorder: Secondary | ICD-10-CM | POA: Diagnosis not present

## 2016-07-09 DIAGNOSIS — F410 Panic disorder [episodic paroxysmal anxiety] without agoraphobia: Secondary | ICD-10-CM | POA: Diagnosis not present

## 2016-07-09 LAB — POCT GLYCOSYLATED HEMOGLOBIN (HGB A1C): HEMOGLOBIN A1C: 6.1

## 2016-07-09 MED ORDER — ALPRAZOLAM 1 MG PO TABS: 1.0000 mg | ORAL_TABLET | Freq: Every evening | ORAL | 0 refills | Status: DC | PRN

## 2016-07-09 MED ORDER — ALPRAZOLAM 0.5 MG PO TABS: 0.5000 mg | ORAL_TABLET | Freq: Three times a day (TID) | ORAL | 0 refills | Status: DC | PRN

## 2016-07-09 NOTE — Progress Notes (Signed)
Name: [name]   MRN: [id number]    DOB: 1951/01/03   Date:07/09/2016       Progress Note  Subjective  Chief Complaint  Chief Complaint  Patient presents with  . Follow-up    1 mo  . Medication Refill    Anxiety  Presents for follow-up visit. Symptoms include depressed mood, excessive worry, insomnia, nervous/anxious behavior and panic. The severity of symptoms is moderate and causing significant distress. The quality of sleep is good.    Insomnia  Primary symptoms: no difficulty falling asleep, frequent awakening.  The onset quality is gradual. The symptoms are aggravated by anxiety and pain. Typical bedtime:  11-12 P.M..  How long after going to bed to you fall asleep: 15-30 minutes.   PMH includes: hypertension, depression, family stress or anxiety, chronic pain.      Past Medical History:  Diagnosis Date  . Anxiety   . Arthritis   . Chronic back pain   . Depression   . GERD (gastroesophageal reflux disease)   . Hypertension   . Hypothyroidism   . Pneumonia    RECENT X 3  . PONV (postoperative nausea and vomiting)   . Psoriasis   . Thyroid disease     Past Surgical History:  Procedure Laterality Date  . ABDOMINAL HYSTERECTOMY     heavy bleeding  . bladder stimulator    . CARPAL TUNNEL RELEASE    . CATARACT EXTRACTION W/PHACO Right 03/07/2015   Procedure: CATARACT EXTRACTION PHACO AND INTRAOCULAR LENS PLACEMENT (IOC);  Surgeon: Estill Cotta, MD;  Location: [facility];  Service: Ophthalmology;  Laterality: Right;  [name] 01:14 AP% 26.4 CDE 33.04 fluid pack lot # [id number]  . CATARACT EXTRACTION W/PHACO Left 03/21/2015   Procedure: CATARACT EXTRACTION PHACO AND INTRAOCULAR LENS PLACEMENT (IOC);  Surgeon: Estill Cotta, MD;  Location: [facility];  Service: Ophthalmology;  Laterality: Left;  [name] 01:21 AP% 25.6 CDE 39.78 fluid pack lot # [id number]  . HAND SURGERY Right   . INTERSTIM IMPLANT REMOVAL N/A 09/07/2015   Procedure: REMOVAL OF INTERSTIM IMPLANT;   Surgeon: Bjorn Loser, MD;  Location: [facility];  Service: Urology;  Laterality: N/A;  . OOPHORECTOMY Right    ovarian cyst    Family History  Problem Relation Age of Onset  . Heart disease Mother   . COPD Father     Social History   Social History  . Marital status: Widowed    Spouse name: N/A  . Number of children: N/A  . Years of education: N/A   Occupational History  . Not on file.   Social History Main Topics  . Smoking status: Current Every Day Smoker    Packs/day: 1.00    Types: Cigarettes  . Smokeless tobacco: Never Used  . Alcohol use No  . Drug use: No  . Sexual activity: No   Other Topics Concern  . Not on file   Social History Narrative  . No narrative on file     Current Outpatient Prescriptions:  .  acetaminophen (TYLENOL) 500 MG tablet, Take 500 mg by mouth every 6 (six) hours as needed for moderate pain. Reported on 07/01/2015, Disp: , Rfl:  .  albuterol (PROVENTIL HFA;VENTOLIN HFA) 108 (90 Base) MCG/ACT inhaler, Inhale 2 puffs into the lungs every 6 (six) hours as needed for wheezing or shortness of breath., Disp: 1 Inhaler, Rfl: 2 .  ALPRAZolam (XANAX) 0.5 MG tablet, Take 1 tablet (0.5 mg total) by mouth 3 (three) times daily as needed for  anxiety., Disp: 90 tablet, Rfl: 0 .  ALPRAZolam (XANAX) 1 MG tablet, Take 1 tablet (1 mg total) by mouth at bedtime as needed for anxiety., Disp: 30 tablet, Rfl: 0 .  betamethasone dipropionate (DIPROLENE) 0.05 % ointment, , Disp: , Rfl:  .  BREO ELLIPTA 100-25 MCG/INH AEPB, INHALE 1 PUFF BY MOUTH INTO THE LUNGS ONCE DAILY, Disp: 60 each, Rfl: 0 .  citalopram (CELEXA) 20 MG tablet, TAKE ONE TABLET BY MOUTH EVERY DAY, Disp: 90 tablet, Rfl: 0 .  diltiazem (CARDIZEM CD) 120 MG 24 hr capsule, TAKE ONE CAPSULE BY MOUTH EVERY DAY, Disp: 90 capsule, Rfl: 0 .  hydrOXYzine (ATARAX/VISTARIL) 25 MG tablet, , Disp: , Rfl:  .  levalbuterol (XOPENEX) 1.25 MG/0.5ML nebulizer solution, Take 1.25 mg by nebulization every 6 (six)  hours., Disp: 1 each, Rfl: 12 .  levothyroxine (SYNTHROID) 100 MCG tablet, Take 1 tablet (100 mcg total) by mouth daily with breakfast., Disp: 90 tablet, Rfl: 1 .  lisinopril (PRINIVIL,ZESTRIL) 10 MG tablet, Take 0.5 tablets (5 mg total) by mouth every morning., Disp: 90 tablet, Rfl: 0 .  omeprazole (PRILOSEC) 20 MG capsule, Take 1 capsule (20 mg total) by mouth daily., Disp: 30 capsule, Rfl: 0 .  pravastatin (PRAVACHOL) 20 MG tablet, Take by mouth., Disp: , Rfl:  .  Secukinumab (COSENTYX [place]), Inject into the skin every 30 (thirty) days. 2 injections per month, Disp: , Rfl:  .  triamcinolone (KENALOG) 0.025 % ointment, , Disp: , Rfl:   Allergies  Allergen Reactions  . Ciprofloxacin Hives and Diarrhea  . Oxycodone Diarrhea  . Oxycodone-Acetaminophen Diarrhea  . Oxycodone-Acetaminophen Diarrhea  . Sulfa Antibiotics Rash    Throat, mouth Throat, mouth     Review of Systems  Psychiatric/Behavioral: Positive for depression. The patient is nervous/anxious and has insomnia.      Objective  Vitals:   07/09/16 1331  BP: 105/64  Pulse: 90  Resp: 18  Temp: 97.8 F (36.6 C)  TempSrc: Oral  SpO2: 93%  Weight: 182 lb 11.2 oz (82.9 kg)  Height: 5\' 3"  (1.6 m)    Physical Exam  Constitutional: She is oriented to person, place, and time and well-developed, well-nourished, and in no distress.  HENT:  Head: Normocephalic and atraumatic.  Cardiovascular: Normal rate, regular rhythm, S1 normal, S2 normal and normal heart sounds.   Pulmonary/Chest: Effort normal and breath sounds normal. She has no wheezes.  Neurological: She is alert and oriented to person, place, and time.  Psychiatric: Memory, affect and judgment normal. Her mood appears anxious.  Nursing note and vitals reviewed.       Assessment & Plan  1. Insomnia due to other mental disorder Stable, continue on alprazolam at night - ALPRAZolam (XANAX) 1 MG tablet; Take 1 tablet (1 mg total) by mouth at bedtime as needed for  anxiety.  Dispense: 30 tablet; Refill: 0  2. Panic disorder without agoraphobia  - ALPRAZolam (XANAX) 0.5 MG tablet; Take 1 tablet (0.5 mg total) by mouth 3 (three) times daily as needed for anxiety.  Dispense: 90 tablet; Refill: 0  3. Hyperglycemia Point-of-care A1c 6.1%, considered prediabetes - POCT glycosylated hemoglobin (Hb A1C)   Kolbie Clarkston Asad A[facility] 07/09/2016 1:37 PM

## 2016-07-13 ENCOUNTER — Ambulatory Visit
Admission: RE | Admit: 2016-07-13 | Discharge: 2016-07-13 | Disposition: A | Discharge status: Home / Self Care | Payer: PPO | Source: Ambulatory Visit | Attending: Urology | Admitting: Urology

## 2016-07-13 DIAGNOSIS — R109 Unspecified abdominal pain: Secondary | ICD-10-CM | POA: Insufficient documentation

## 2016-07-13 DIAGNOSIS — R350 Frequency of micturition: Secondary | ICD-10-CM | POA: Diagnosis not present

## 2016-07-13 DIAGNOSIS — K760 Fatty (change of) liver, not elsewhere classified: Secondary | ICD-10-CM | POA: Diagnosis not present

## 2016-07-16 ENCOUNTER — Telehealth: Payer: Self-pay

## 2016-07-16 NOTE — Telephone Encounter (Signed)
Festus Aloe, MD  [place]        Notify patient -- her kidneys are normal on the renal US -- no stone or blockage was found.    Spoke to patient gave US Renal results. Patient verbalized understanding.  Patient requesting urine culture results.

## 2016-07-26 ENCOUNTER — Ambulatory Visit (INDEPENDENT_AMBULATORY_CARE_PROVIDER_SITE_OTHER): Payer: PPO | Admitting: Urology

## 2016-07-26 ENCOUNTER — Encounter: Payer: Self-pay | Admitting: Urology

## 2016-07-26 ENCOUNTER — Other Ambulatory Visit: Payer: Self-pay | Admitting: Family Medicine

## 2016-07-26 ENCOUNTER — Ambulatory Visit: Payer: Self-pay | Admitting: Urology

## 2016-07-26 VITALS — BP 143/68 | HR 111 | Ht 62.5 in | Wt 181.4 lb

## 2016-07-26 DIAGNOSIS — N3946 Mixed incontinence: Secondary | ICD-10-CM | POA: Diagnosis not present

## 2016-07-26 DIAGNOSIS — R109 Unspecified abdominal pain: Secondary | ICD-10-CM

## 2016-07-26 DIAGNOSIS — R300 Dysuria: Secondary | ICD-10-CM

## 2016-07-26 DIAGNOSIS — R350 Frequency of micturition: Secondary | ICD-10-CM | POA: Diagnosis not present

## 2016-07-26 DIAGNOSIS — F3341 Major depressive disorder, recurrent, in partial remission: Secondary | ICD-10-CM

## 2016-07-26 DIAGNOSIS — Z720 Tobacco use: Secondary | ICD-10-CM

## 2016-07-26 DIAGNOSIS — I10 Essential (primary) hypertension: Secondary | ICD-10-CM

## 2016-07-26 LAB — BLADDER SCAN AMB NON-IMAGING: Scan Result: 202

## 2016-07-26 LAB — URINALYSIS, COMPLETE
BILIRUBIN UA: NEGATIVE
Glucose, UA: NEGATIVE
KETONES UA: NEGATIVE
NITRITE UA: POSITIVE — AB
PH UA: 6 (ref 5.0–7.5)
Protein, UA: NEGATIVE
Specific Gravity, UA: 1.015 (ref 1.005–1.030)
UUROB: 1 mg/dL (ref 0.2–1.0)

## 2016-07-26 LAB — MICROSCOPIC EXAMINATION: RBC MICROSCOPIC, UA: NONE SEEN /HPF (ref 0–?)

## 2016-07-26 MED ORDER — AMOXICILLIN-POT CLAVULANATE 875-125 MG PO TABS: 1.0000 | ORAL_TABLET | Freq: Two times a day (BID) | ORAL | 0 refills | Status: DC

## 2016-07-26 NOTE — Progress Notes (Signed)
07/26/2016 3:25 PM   [name] [name] May 04, 1950 [id number]  Referring provider: Roselee Nova, MD [address] [place], [place] [address]  Chief Complaint  Patient presents with  . Results    [name]  . Follow-up    6weeks Mixed Incontinence    HPI: Patient is a 66 year old Caucasian female who presents today to discuss the results of her renal ultrasound.    Background history She was seen Aug 2017 following Interstim removal as it was thought it was causing pain. The patient had an InterStim year 2010. It was close to the midline and hurt. The device was on. At the time, she reports that her flow was good. She sometimes gets up once at night. In voids every 2 hours. She wears 2-3 light pads per day and denies urge incontinence.  At her visit one month ago, she's noticed left flank pain radiating into LLQ when she voids. No burning with urination, more "pain". Pain is moderate to severe. She's noticed foamy urine. No gross hematuria. She's voiding about every hour. She has noc x 2. She voids with a good stream. She leaks every "once in awhile". No UUI. Small leak with laugh or cough. She feels like this is a different pain than the Interstim was causing. No agravating or alleviating symptoms. No associated signs or symptoms.   UA today shows > 30 wbc, many bacteria, no microhematuria. She was recently evaluated for back pain. Her Lumbar xray showed significant DDD. Urine culture was negative.    PVR 72 mL.  She was started on a trial of Vesicare and Myrbetriq  RUS performed on 07/13/2016 was normal.    Today, the patient has been experiencing urgency x 4-7, frequency x 4-7, not restricting fluids to avoid visits to the restroom, is engaging in toilet mapping, incontinence x 0-3 and nocturia x 0-3.    She admits to dysuria and suprapubic pain.  She is also having left waist pain.  Pain is a 10/10.  She states that she is passing white globs.  She denies gross hematuria.   Nothing helps the pain and Nothing makes it better.  She also has not any recent fevers and chills.  She is not having nausea or vomiting.  This has been going on for 4 weeks.    Her RUS was normal on 07/13/2016.  Her PVR was 202 mL.  Her UA was positive for 11-30 WBC's, many bacteria and is nitrite positive.    MH: Past Medical History:  Diagnosis Date  . Anxiety   . Arthritis   . Chronic back pain   . Depression   . GERD (gastroesophageal reflux disease)   . Hypertension   . Hypothyroidism   . Pneumonia    RECENT X 3  . PONV (postoperative nausea and vomiting)   . Psoriasis   . Thyroid disease     Surgical History: Past Surgical History:  Procedure Laterality Date  . ABDOMINAL HYSTERECTOMY     heavy bleeding  . bladder stimulator    . CARPAL TUNNEL RELEASE    . CATARACT EXTRACTION W/PHACO Right 03/07/2015   Procedure: CATARACT EXTRACTION PHACO AND INTRAOCULAR LENS PLACEMENT (IOC);  Surgeon: Estill Cotta, MD;  Location: [facility];  Service: Ophthalmology;  Laterality: Right;  [name] 01:14 AP% 26.4 CDE 33.04 fluid pack lot # [id number]  . CATARACT EXTRACTION W/PHACO Left 03/21/2015   Procedure: CATARACT EXTRACTION PHACO AND INTRAOCULAR LENS PLACEMENT (IOC);  Surgeon: Estill Cotta, MD;  Location:  [facility];  Service: Ophthalmology;  Laterality: Left;  [name] 01:21 AP% 25.6 CDE 39.78 fluid pack lot # [id number]  . HAND SURGERY Right   . INTERSTIM IMPLANT REMOVAL N/A 09/07/2015   Procedure: REMOVAL OF INTERSTIM IMPLANT;  Surgeon: Bjorn Loser, MD;  Location: [facility];  Service: Urology;  Laterality: N/A;  . OOPHORECTOMY Right    ovarian cyst    Home Medications:  Allergies as of 07/26/2016      Reactions   Ciprofloxacin Hives, Diarrhea   Oxycodone Diarrhea   Oxycodone-acetaminophen Diarrhea   Oxycodone-acetaminophen Diarrhea   Sulfa Antibiotics Rash   Throat, mouth Throat, mouth      Medication List       Accurate as of 07/26/16  3:25 PM. Always use your most recent  med list.          acetaminophen 500 MG tablet Commonly known as:  TYLENOL Take 500 mg by mouth every 6 (six) hours as needed for moderate pain. Reported on 07/01/2015   albuterol 108 (90 Base) MCG/ACT inhaler Commonly known as:  PROVENTIL HFA;VENTOLIN HFA Inhale 2 puffs into the lungs every 6 (six) hours as needed for wheezing or shortness of breath.   ALPRAZolam 1 MG tablet Commonly known as:  XANAX Take 1 tablet (1 mg total) by mouth at bedtime as needed for anxiety.   ALPRAZolam 0.5 MG tablet Commonly known as:  XANAX Take 1 tablet (0.5 mg total) by mouth 3 (three) times daily as needed for anxiety.   amoxicillin-clavulanate 875-125 MG tablet Commonly known as:  AUGMENTIN Take 1 tablet by mouth every 12 (twelve) hours.   betamethasone dipropionate 0.05 % ointment Commonly known as:  DIPROLENE   BREO ELLIPTA 100-25 MCG/INH Aepb Generic drug:  fluticasone furoate-vilanterol INHALE 1 PUFF BY MOUTH INTO THE LUNGS ONCE DAILY   citalopram 20 MG tablet Commonly known as:  CELEXA TAKE ONE TABLET BY MOUTH EVERY DAY   COSENTYX [place] Inject into the skin every 30 (thirty) days. 2 injections per month   diltiazem 120 MG 24 hr capsule Commonly known as:  CARDIZEM CD TAKE ONE CAPSULE BY MOUTH EVERY DAY   hydrOXYzine 25 MG tablet Commonly known as:  ATARAX/VISTARIL   levalbuterol 1.25 MG/0.5ML nebulizer solution Commonly known as:  XOPENEX Take 1.25 mg by nebulization every 6 (six) hours.   levothyroxine 100 MCG tablet Commonly known as:  SYNTHROID Take 1 tablet (100 mcg total) by mouth daily with breakfast.   lisinopril 10 MG tablet Commonly known as:  PRINIVIL,ZESTRIL Take 0.5 tablets (5 mg total) by mouth every morning.   omeprazole 20 MG capsule Commonly known as:  PRILOSEC Take 1 capsule (20 mg total) by mouth daily.   pravastatin 20 MG tablet Commonly known as:  PRAVACHOL Take by mouth.   triamcinolone 0.025 % ointment Commonly known as:  KENALOG        Allergies:  Allergies  Allergen Reactions  . Ciprofloxacin Hives and Diarrhea  . Oxycodone Diarrhea  . Oxycodone-Acetaminophen Diarrhea  . Oxycodone-Acetaminophen Diarrhea  . Sulfa Antibiotics Rash    Throat, mouth Throat, mouth    Family History: Family History  Problem Relation Age of Onset  . Heart disease Mother   . COPD Father   . Kidney disease Sister   . Kidney cancer Neg Hx   . Bladder Cancer Neg Hx     Social History:  reports that she has been smoking Cigarettes.  She has been smoking about 1.00 pack per day. She has never used smokeless  tobacco. She reports that she does not drink alcohol or use drugs.  ROS: UROLOGY Frequent Urination?: Yes Hard to postpone urination?: No Burning/pain with urination?: Yes Get up at night to urinate?: Yes Leakage of urine?: No Urine stream starts and stops?: No Trouble starting stream?: No Do you have to strain to urinate?: No Blood in urine?: No Urinary tract infection?: Yes Sexually transmitted disease?: No Injury to kidneys or bladder?: No Painful intercourse?: No Weak stream?: No Currently pregnant?: No Vaginal bleeding?: No Last menstrual period?: n  Gastrointestinal Nausea?: Yes Vomiting?: No Indigestion/heartburn?: No Diarrhea?: No Constipation?: No  Constitutional Fever: Yes Night sweats?: No Weight loss?: No Fatigue?: Yes  Skin Skin rash/lesions?: Yes Itching?: No  Eyes Blurred vision?: No Double vision?: No  Ears/Nose/Throat Sore throat?: No Sinus problems?: No  Hematologic/Lymphatic Swollen glands?: No Easy bruising?: Yes  Cardiovascular Leg swelling?: No Chest pain?: No  Respiratory Cough?: Yes Shortness of breath?: Yes  Endocrine Excessive thirst?: Yes  Musculoskeletal Back pain?: Yes Joint pain?: No  Neurological Headaches?: No Dizziness?: No  Psychologic Depression?: Yes Anxiety?: Yes  Physical Exam: BP (!) 143/68   Pulse (!) 111   Ht 5' 2.5" (1.588 m)    Wt 181 lb 6.4 oz (82.3 kg)   BMI 32.65 kg/m   Constitutional: Well nourished. Alert and oriented, No acute distress. HEENT: [place] AT, moist mucus membranes. Trachea midline, no masses. Cardiovascular: No clubbing, cyanosis, or edema. Respiratory: Normal respiratory effort, no increased work of breathing. GI: Abdomen is soft, non tender, non distended, no abdominal masses. Liver and spleen not palpable.  No hernias appreciated.  Stool sample for occult testing is not indicated.   GU: No CVA tenderness.  No bladder fullness or masses.  Atrophic external genitalia, normal pubic hair distribution, no lesions.  Normal urethral meatus, no lesions, no prolapse, no discharge.   No urethral masses, tenderness and/or tenderness. No bladder fullness, tenderness or masses. Pale vagina mucosa, poor estrogen effect, no discharge, no lesions, good pelvic support, no cystocele or rectocele noted.  Cervix, uterus and adnexa are surgically absent.  Anus and perineum are without rashes or lesions.    Skin: No rashes, bruises or suspicious lesions. Lymph: No cervical or inguinal adenopathy. Neurologic: Grossly intact, no focal deficits, moving all 4 extremities. Psychiatric: Normal mood and affect.  Laboratory Data: Lab Results  Component Value Date   WBC 17.3 (H) 12/20/2015   HGB 13.5 12/20/2015   HCT 41.0 12/20/2015   MCV 92.5 12/20/2015   PLT 218 12/20/2015    Lab Results  Component Value Date   CREATININE 1.00 12/18/2015     Lab Results  Component Value Date   HGBA1C 6.1 07/09/2016     Pertinent imaging CLINICAL DATA:  Left flank pain in urinary frequency.  EXAM: RENAL / URINARY TRACT ULTRASOUND COMPLETE  COMPARISON:  None.  FINDINGS: Right Kidney:  Length: 9.3 cm. Echogenicity within normal limits. No mass or hydronephrosis visualized.  Left Kidney:  Length: 9.8 cm. Echogenicity within normal limits. No mass or hydronephrosis visualized.  Bladder:  Appears normal for  degree of bladder distention.  Visualized right liver appears notably echogenic compared to the kidney.  IMPRESSION: 1. Normal renal ultrasound. 2. Probable hepatic steatosis.   Electronically Signed   By: Monte Fantasia M.D.   On: 07/13/2016 13:59   Assessment & Plan:    1. Dysuria  - UA suspicious for infection  - Start Augmentin empirically will adjust once culture and sensitivities are available if necessary  -  urine sent for culture  - RTC in weeks for exam and symptoms recheck  2. Flank pain  - UA was suspicious for infection  - sending for culture  3. Frequency   - trial of Vesicare and Myrbetriq - ineffective may be due to infection  - will reassess once infection has cleared  4. Tobacco abuse  - Explained to the patient how smoking tobacco as a bladder irritant and encouraged the patient to quit  5. Vaginal atrophy  - start Premarin vaginal cream 3 nights weekly  - RTC in 2 weeks for exam     Return in about 2 weeks (around 08/09/2016) for OAB questionnaire, PVR and exam.  Zara Council, [facility]  [place] [address], [place], [place] [address] [phone]

## 2016-07-27 NOTE — Telephone Encounter (Signed)
Too soon for SSRI Last Cr and K+ was in November I'll approve one month of ACE-I in colleague's absence

## 2016-07-28 LAB — CULTURE, URINE COMPREHENSIVE

## 2016-07-30 ENCOUNTER — Telehealth: Payer: Self-pay

## 2016-07-30 NOTE — Telephone Encounter (Signed)
Patient notified

## 2016-07-30 NOTE — Telephone Encounter (Signed)
-----   Message from Nori Riis, PA-C sent at 07/29/2016 10:03 PM EDT ----- Please let Mr. Wyke know that her urine culture is positive for infection and the Augmentin 875/125 is the appropriate antibiotic.

## 2016-08-01 ENCOUNTER — Encounter: Payer: Self-pay | Admitting: Family Medicine

## 2016-08-07 ENCOUNTER — Encounter: Payer: Self-pay | Admitting: Family Medicine

## 2016-08-07 ENCOUNTER — Ambulatory Visit (INDEPENDENT_AMBULATORY_CARE_PROVIDER_SITE_OTHER): Payer: PPO | Admitting: Family Medicine

## 2016-08-07 DIAGNOSIS — F5105 Insomnia due to other mental disorder: Secondary | ICD-10-CM

## 2016-08-07 DIAGNOSIS — F410 Panic disorder [episodic paroxysmal anxiety] without agoraphobia: Secondary | ICD-10-CM

## 2016-08-07 DIAGNOSIS — F99 Mental disorder, not otherwise specified: Secondary | ICD-10-CM

## 2016-08-07 MED ORDER — ALPRAZOLAM 0.5 MG PO TABS: 0.5000 mg | ORAL_TABLET | Freq: Three times a day (TID) | ORAL | 0 refills | Status: DC | PRN

## 2016-08-07 MED ORDER — ALPRAZOLAM 1 MG PO TABS: 1.0000 mg | ORAL_TABLET | Freq: Every evening | ORAL | 0 refills | Status: DC | PRN

## 2016-08-07 NOTE — Progress Notes (Signed)
Name: [name]   MRN: [id number]    DOB: March 12, 1950   Date:08/07/2016       Progress Note  Subjective  Chief Complaint  Chief Complaint  Patient presents with  . Medication Refill    Anxiety  Presents for follow-up visit. Symptoms include depressed mood, excessive worry, insomnia, nervous/anxious behavior and panic. Patient reports no malaise, shortness of breath or suicidal ideas. The severity of symptoms is moderate and causing significant distress. The quality of sleep is good.    Insomnia  Primary symptoms: no difficulty falling asleep, no frequent awakening.  The onset quality is gradual. The problem occurs nightly. The symptoms are aggravated by anxiety and pain. Typical bedtime:  11-12 P.M..  How long after going to bed to you fall asleep: 15-30 minutes.   PMH includes: hypertension, depression, family stress or anxiety, chronic pain.      Past Medical History:  Diagnosis Date  . Anxiety   . Arthritis   . Chronic back pain   . Depression   . GERD (gastroesophageal reflux disease)   . Hypertension   . Hypothyroidism   . Pneumonia    RECENT X 3  . PONV (postoperative nausea and vomiting)   . Psoriasis   . Thyroid disease     Past Surgical History:  Procedure Laterality Date  . ABDOMINAL HYSTERECTOMY     heavy bleeding  . bladder stimulator    . CARPAL TUNNEL RELEASE    . CATARACT EXTRACTION W/PHACO Right 03/07/2015   Procedure: CATARACT EXTRACTION PHACO AND INTRAOCULAR LENS PLACEMENT (IOC);  Surgeon: Estill Cotta, MD;  Location: [facility];  Service: Ophthalmology;  Laterality: Right;  [name] 01:14 AP% 26.4 CDE 33.04 fluid pack lot # [id number]  . CATARACT EXTRACTION W/PHACO Left 03/21/2015   Procedure: CATARACT EXTRACTION PHACO AND INTRAOCULAR LENS PLACEMENT (IOC);  Surgeon: Estill Cotta, MD;  Location: [facility];  Service: Ophthalmology;  Laterality: Left;  [name] 01:21 AP% 25.6 CDE 39.78 fluid pack lot # [id number]  . HAND SURGERY Right   . INTERSTIM  IMPLANT REMOVAL N/A 09/07/2015   Procedure: REMOVAL OF INTERSTIM IMPLANT;  Surgeon: Bjorn Loser, MD;  Location: [facility];  Service: Urology;  Laterality: N/A;  . OOPHORECTOMY Right    ovarian cyst    Family History  Problem Relation Age of Onset  . Heart disease Mother   . COPD Father   . Kidney disease Sister   . Kidney cancer Neg Hx   . Bladder Cancer Neg Hx     Social History   Social History  . Marital status: Widowed    Spouse name: N/A  . Number of children: N/A  . Years of education: N/A   Occupational History  . Not on file.   Social History Main Topics  . Smoking status: Current Every Day Smoker    Packs/day: 1.00    Types: Cigarettes  . Smokeless tobacco: Never Used  . Alcohol use No  . Drug use: No  . Sexual activity: No   Other Topics Concern  . Not on file   Social History Narrative  . No narrative on file     Current Outpatient Prescriptions:  .  acetaminophen (TYLENOL) 500 MG tablet, Take 500 mg by mouth every 6 (six) hours as needed for moderate pain. Reported on 07/01/2015, Disp: , Rfl:  .  albuterol (PROVENTIL HFA;VENTOLIN HFA) 108 (90 Base) MCG/ACT inhaler, Inhale 2 puffs into the lungs every 6 (six) hours as needed for wheezing or shortness of breath.,  Disp: 1 Inhaler, Rfl: 2 .  ALPRAZolam (XANAX) 0.5 MG tablet, Take 1 tablet (0.5 mg total) by mouth 3 (three) times daily as needed for anxiety., Disp: 90 tablet, Rfl: 0 .  ALPRAZolam (XANAX) 1 MG tablet, Take 1 tablet (1 mg total) by mouth at bedtime as needed for anxiety., Disp: 30 tablet, Rfl: 0 .  amoxicillin-clavulanate (AUGMENTIN) 875-125 MG tablet, Take 1 tablet by mouth every 12 (twelve) hours., Disp: 14 tablet, Rfl: 0 .  betamethasone dipropionate (DIPROLENE) 0.05 % ointment, , Disp: , Rfl:  .  BREO ELLIPTA 100-25 MCG/INH AEPB, INHALE 1 PUFF BY MOUTH INTO THE LUNGS ONCE DAILY, Disp: 60 each, Rfl: 0 .  citalopram (CELEXA) 20 MG tablet, TAKE ONE TABLET BY MOUTH EVERY DAY, Disp: 90 tablet,  Rfl: 0 .  diltiazem (CARDIZEM CD) 120 MG 24 hr capsule, TAKE ONE CAPSULE BY MOUTH EVERY DAY, Disp: 90 capsule, Rfl: 0 .  hydrOXYzine (ATARAX/VISTARIL) 25 MG tablet, , Disp: , Rfl:  .  levalbuterol (XOPENEX) 1.25 MG/0.5ML nebulizer solution, Take 1.25 mg by nebulization every 6 (six) hours., Disp: 1 each, Rfl: 12 .  levothyroxine (SYNTHROID) 100 MCG tablet, Take 1 tablet (100 mcg total) by mouth daily with breakfast., Disp: 90 tablet, Rfl: 1 .  lisinopril (PRINIVIL,ZESTRIL) 10 MG tablet, TAKE 1/2 TABLET BY MOUTH EVERY MORNING, Disp: 15 tablet, Rfl: 0 .  omeprazole (PRILOSEC) 20 MG capsule, Take 1 capsule (20 mg total) by mouth daily. (Patient not taking: Reported on 07/26/2016), Disp: 30 capsule, Rfl: 0 .  pravastatin (PRAVACHOL) 20 MG tablet, Take by mouth., Disp: , Rfl:  .  Secukinumab (COSENTYX [place]), Inject into the skin every 30 (thirty) days. 2 injections per month, Disp: , Rfl:  .  triamcinolone (KENALOG) 0.025 % ointment, , Disp: , Rfl:   Allergies  Allergen Reactions  . Ciprofloxacin Hives and Diarrhea  . Oxycodone Diarrhea  . Oxycodone-Acetaminophen Diarrhea  . Oxycodone-Acetaminophen Diarrhea  . Sulfa Antibiotics Rash    Throat, mouth Throat, mouth     Review of Systems  Respiratory: Negative for shortness of breath.   Psychiatric/Behavioral: Positive for depression. Negative for suicidal ideas. The patient is nervous/anxious and has insomnia.       Objective  Vitals:   08/07/16 1515  BP: 136/64  Pulse: 100  Resp: 16  Temp: 98.2 F (36.8 C)  TempSrc: Oral  SpO2: 90%  Weight: 181 lb 1.6 oz (82.1 kg)  Height: 5\' 3"  (1.6 m)    Physical Exam  Constitutional: She is oriented to person, place, and time and well-developed, well-nourished, and in no distress.  Cardiovascular: Normal rate, regular rhythm and normal heart sounds.   No murmur heard. Pulmonary/Chest: Breath sounds normal. She has no wheezes.  Neurological: She is alert and oriented to person, place, and  time.  Psychiatric: Memory, affect and judgment normal. Her mood appears anxious.  Nursing note and vitals reviewed.   Assessment & Plan  1. Insomnia due to other mental disorder Stable and responsive to alprazolam taken at bedtime as needed, refills provided - ALPRAZolam (XANAX) 1 MG tablet; Take 1 tablet (1 mg total) by mouth at bedtime as needed for anxiety.  Dispense: 30 tablet; Refill: 0  2. Panic disorder without agoraphobia Stable and responsive to alprazolam taken up to 3 times daily as needed - ALPRAZolam (XANAX) 0.5 MG tablet; Take 1 tablet (0.5 mg total) by mouth 3 (three) times daily as needed for anxiety.  Dispense: 90 tablet; Refill: 0   Ashtyn Freilich Asad A. Manu[facility]  [facility] 08/07/2016 3:39 PM

## 2016-08-09 ENCOUNTER — Ambulatory Visit: Payer: Self-pay | Admitting: Urology

## 2016-08-09 DIAGNOSIS — L408 Other psoriasis: Secondary | ICD-10-CM | POA: Diagnosis not present

## 2016-08-09 DIAGNOSIS — Z79899 Other long term (current) drug therapy: Secondary | ICD-10-CM | POA: Diagnosis not present

## 2016-08-23 ENCOUNTER — Ambulatory Visit: Payer: Self-pay | Admitting: Urology

## 2016-08-24 ENCOUNTER — Other Ambulatory Visit: Payer: Self-pay | Admitting: Family Medicine

## 2016-08-24 DIAGNOSIS — F3341 Major depressive disorder, recurrent, in partial remission: Secondary | ICD-10-CM

## 2016-08-24 DIAGNOSIS — I10 Essential (primary) hypertension: Secondary | ICD-10-CM

## 2016-08-28 NOTE — Progress Notes (Signed)
08/29/2016 10:28 AM   [name] 04-Jan-1951 [id number]  Referring provider: Roselee Nova, MD [address] [name], [name] [address]  Chief Complaint  Patient presents with  . Urinary Frequency    2 week follow up    HPI: Patient is a 66 year old Caucasian female who presents today to for a 2 week follow up.      Background history She was seen Aug 2017 following Interstim removal as it was thought it was causing pain. The patient had an InterStim year 2010. It was close to the midline and hurt. The device was on. At the time, she reports that her flow was good. She sometimes gets up once at night. In voids every 2 hours. She wears 2-3 light pads per day and denies urge incontinence.  At her visit one month ago, she's noticed left flank pain radiating into LLQ when she voids. No burning with urination, more "pain". Pain is moderate to severe. She's noticed foamy urine. No gross hematuria. She's voiding about every hour. She has noc x 2. She voids with a good stream. She leaks every "once in awhile". No UUI. Small leak with laugh or cough. She feels like this is a different pain than the Interstim was causing. No agravating or alleviating symptoms. No associated signs or symptoms.   At her visit in 06/2016, her UA  shows > 30 wbc, many bacteria, no microhematuria. She was recently evaluated for back pain. Her Lumbar xray showed significant DDD. Urine culture was negative.  PVR 72 mL.  She was started on a trial of Vesicare and Myrbetriq.  RUS performed on 07/13/2016 was normal.    At her visit in 07/2016, she admits to dysuria and suprapubic pain.  She is also having left waist pain.  Pain is a 10/10.  She states that she is passing white globs.  She denies gross hematuria.  Nothing helps the pain and Nothing makes it better.  She also has not any recent fevers and chills.  She is not having nausea or vomiting.  This has been going on for 4 weeks.    Her RUS was  normal on 07/13/2016.  Her PVR was 202 mL.  Her UA was positive for 11-30 WBC's, many bacteria and is nitrite positive.  Urine culture was positive for Klebsiella pneumoniae.    Today, the patient has been experiencing urgency x 0-3 (improved), frequency x 4-7 (stable), is restricting fluids to avoid visits to the restroom, is engaging in toilet mapping, incontinence x 0-3 (stable) and nocturia x 0-3 (stable).   Her PVR today is negative.   She states she is still having a white discharge.     MH: Past Medical History:  Diagnosis Date  . Anxiety   . Arthritis   . Chronic back pain   . Depression   . GERD (gastroesophageal reflux disease)   . Hypertension   . Hypothyroidism   . Pneumonia    RECENT X 3  . PONV (postoperative nausea and vomiting)   . Psoriasis   . Thyroid disease     Surgical History: Past Surgical History:  Procedure Laterality Date  . ABDOMINAL HYSTERECTOMY     heavy bleeding  . bladder stimulator    . CARPAL TUNNEL RELEASE    . CATARACT EXTRACTION W/PHACO Right 03/07/2015   Procedure: CATARACT EXTRACTION PHACO AND INTRAOCULAR LENS PLACEMENT (IOC);  Surgeon: Estill Cotta, MD;  Location: [facility];  Service: Ophthalmology;  Laterality: Right;  [name] 01:14 AP% 26.4 CDE 33.04 fluid pack lot # [id number]  . CATARACT EXTRACTION W/PHACO Left 03/21/2015   Procedure: CATARACT EXTRACTION PHACO AND INTRAOCULAR LENS PLACEMENT (IOC);  Surgeon: Estill Cotta, MD;  Location: [facility];  Service: Ophthalmology;  Laterality: Left;  [name] 01:21 AP% 25.6 CDE 39.78 fluid pack lot # [id number]  . HAND SURGERY Right   . INTERSTIM IMPLANT REMOVAL N/A 09/07/2015   Procedure: REMOVAL OF INTERSTIM IMPLANT;  Surgeon: Bjorn Loser, MD;  Location: [facility];  Service: Urology;  Laterality: N/A;  . OOPHORECTOMY Right    ovarian cyst    Home Medications:  Allergies as of 08/29/2016      Reactions   Ciprofloxacin Hives, Diarrhea   Oxycodone Diarrhea   Oxycodone-acetaminophen Diarrhea     Oxycodone-acetaminophen Diarrhea   Sulfa Antibiotics Rash   Throat, mouth Throat, mouth      Medication List       Accurate as of 08/29/16 10:28 AM. Always use your most recent med list.          acetaminophen 500 MG tablet Commonly known as:  TYLENOL Take 500 mg by mouth every 6 (six) hours as needed for moderate pain. Reported on 07/01/2015   albuterol 108 (90 Base) MCG/ACT inhaler Commonly known as:  PROVENTIL HFA;VENTOLIN HFA Inhale 2 puffs into the lungs every 6 (six) hours as needed for wheezing or shortness of breath.   ALPRAZolam 1 MG tablet Commonly known as:  XANAX Take 1 tablet (1 mg total) by mouth at bedtime as needed for anxiety.   ALPRAZolam 0.5 MG tablet Commonly known as:  XANAX Take 1 tablet (0.5 mg total) by mouth 3 (three) times daily as needed for anxiety.   amoxicillin-clavulanate 875-125 MG tablet Commonly known as:  AUGMENTIN Take 1 tablet by mouth every 12 (twelve) hours.   betamethasone dipropionate 0.05 % ointment Commonly known as:  DIPROLENE   BREO ELLIPTA 100-25 MCG/INH Aep[place] Generic drug:  fluticasone furoate-vilanterol INHALE 1 PUFF BY MOUTH INTO THE LUNGS ONCE DAILY   citalopram 20 MG tablet Commonly known as:  CELEXA TAKE ONE TABLET BY MOUTH EVERY DAY   COSENTYX [place] Inject into the skin every 30 (thirty) days. 2 injections per month   diltiazem 120 MG 24 hr capsule Commonly known as:  CARDIZEM CD TAKE ONE CAPSULE BY MOUTH EVERY DAY   hydrOXYzine 25 MG tablet Commonly known as:  ATARAX/VISTARIL   levalbuterol 1.25 MG/0.5ML nebulizer solution Commonly known as:  XOPENEX Take 1.25 mg by nebulization every 6 (six) hours.   levothyroxine 100 MCG tablet Commonly known as:  SYNTHROID Take 1 tablet (100 mcg total) by mouth daily with breakfast.   lisinopril 10 MG tablet Commonly known as:  PRINIVIL,ZESTRIL TAKE 1/2 TABLET BY MOUTH EVERY MORNING   lisinopril 10 MG tablet Commonly known as:  PRINIVIL,ZESTRIL TAKE 1/2 TABLET  BY MOUTH EVERY MORNING   omeprazole 20 MG capsule Commonly known as:  PRILOSEC Take 1 capsule (20 mg total) by mouth daily.   pravastatin 20 MG tablet Commonly known as:  PRAVACHOL Take by mouth.   triamcinolone 0.025 % ointment Commonly known as:  KENALOG       Allergies:  Allergies  Allergen Reactions  . Ciprofloxacin Hives and Diarrhea  . Oxycodone Diarrhea  . Oxycodone-Acetaminophen Diarrhea  . Oxycodone-Acetaminophen Diarrhea  . Sulfa Antibiotics Rash    Throat, mouth Throat, mouth    Family History: Family History  Problem Relation Age of Onset  . Heart disease Mother   .  COPD Father   . Kidney disease Sister   . Kidney cancer Neg Hx   . Bladder Cancer Neg Hx     Social History:  reports that she has been smoking Cigarettes.  She has been smoking about 1.00 pack per day. She has never used smokeless tobacco. She reports that she does not drink alcohol or use drugs.  ROS: UROLOGY Frequent Urination?: No Hard to postpone urination?: No Burning/pain with urination?: No Get up at night to urinate?: No Leakage of urine?: No Urine stream starts and stops?: No Trouble starting stream?: No Do you have to strain to urinate?: No Blood in urine?: No Urinary tract infection?: No Sexually transmitted disease?: No Injury to kidneys or bladder?: No Painful intercourse?: No Weak stream?: No Currently pregnant?: No Vaginal bleeding?: No Last menstrual period?: n  Gastrointestinal Nausea?: No Vomiting?: No Indigestion/heartburn?: No Diarrhea?: No Constipation?: No  Constitutional Fever: No Night sweats?: No Weight loss?: No Fatigue?: No  Skin Skin rash/lesions?: Yes Itching?: No  Eyes Blurred vision?: No Double vision?: No  Ears/Nose/Throat Sore throat?: No Sinus problems?: No  Hematologic/Lymphatic Swollen glands?: No Easy bruising?: No  Cardiovascular Leg swelling?: No Chest pain?: No  Respiratory Cough?: No Shortness of breath?:  No  Endocrine Excessive thirst?: No  Musculoskeletal Back pain?: Yes Joint pain?: No  Neurological Headaches?: No Dizziness?: No  Psychologic Depression?: Yes Anxiety?: Yes  Physical Exam: BP 137/81   Pulse 92   Ht 5' 2.5" (1.588 m)   Wt 179 lb 12.8 oz (81.6 kg)   BMI 32.36 kg/m   Constitutional: Well nourished. Alert and oriented, No acute distress. HEENT: [place] AT, moist mucus membranes. Trachea midline, no masses. Cardiovascular: No clubbing, cyanosis, or edema. Respiratory: Normal respiratory effort, no increased work of breathing. GI: Abdomen is soft, non tender, non distended, no abdominal masses. Liver and spleen not palpable.  No hernias appreciated.  Stool sample for occult testing is not indicated.   GU: No CVA tenderness.  No bladder fullness or masses.  Atrophic external genitalia, normal pubic hair distribution, no lesions.  Normal urethral meatus, no lesions, no prolapse, no discharge.   No urethral masses, tenderness and/or tenderness. No bladder fullness, tenderness or masses. Pale vagina mucosa, poor estrogen effect, no discharge, no lesions, good pelvic support, no cystocele or rectocele noted.  Cervix, uterus and adnexa are surgically absent.  Anus and perineum are without rashes or lesions.    Skin: No rashes, bruises or suspicious lesions. Lymph: No cervical or inguinal adenopathy. Neurologic: Grossly intact, no focal deficits, moving all 4 extremities. Psychiatric: Normal mood and affect.  Laboratory Data: Lab Results  Component Value Date   WBC 17.3 (H) 12/20/2015   HGB 13.5 12/20/2015   HCT 41.0 12/20/2015   MCV 92.5 12/20/2015   PLT 218 12/20/2015    Lab Results  Component Value Date   CREATININE 1.00 12/18/2015     Lab Results  Component Value Date   HGBA1C 6.1 07/09/2016   I have reviewed the labs  Pertinent imaging Results for TACI, STERLING (MRN [id number]) as of 08/29/2016 10:24  Ref. Range 08/29/2016 09:57  Scan Result Unknown  58    Assessment & Plan:    1. Dysuria  - resolved  2. Flank pain  - resolved  3. Frequency   - only on Myrbetriq - finds it efffective  - RTC in 3 months for OAB questionnaire and PVR    4. Tobacco abuse  - Explained to the patient how smoking  tobacco as a bladder irritant and encouraged the patient to quit  5. Vaginal atrophy/discharge  - start Premarin vaginal cream 3 nights weekly  - discharge is physiological   - RTC in 3 months for exam  6.  Cystocele  - referred to gynecology for possible pessary  Return in about 3 months (around 11/29/2016) for OAB questionnaire, PVR and exam.  [name], [facility]  [facility] [address], [place], [place] [address] [phone]

## 2016-08-29 ENCOUNTER — Ambulatory Visit: Payer: PPO | Admitting: Urology

## 2016-08-29 ENCOUNTER — Encounter: Payer: Self-pay | Admitting: Urology

## 2016-08-29 VITALS — BP 137/81 | HR 92 | Ht 62.5 in | Wt 179.8 lb

## 2016-08-29 DIAGNOSIS — N8111 Cystocele, midline: Secondary | ICD-10-CM | POA: Diagnosis not present

## 2016-08-29 DIAGNOSIS — R350 Frequency of micturition: Secondary | ICD-10-CM

## 2016-08-29 DIAGNOSIS — N952 Postmenopausal atrophic vaginitis: Secondary | ICD-10-CM

## 2016-08-29 DIAGNOSIS — Z720 Tobacco use: Secondary | ICD-10-CM | POA: Diagnosis not present

## 2016-08-29 LAB — BLADDER SCAN AMB NON-IMAGING: Scan Result: 58

## 2016-08-29 MED ORDER — MIRABEGRON ER 25 MG PO TB24: 25.0000 mg | ORAL_TABLET | Freq: Every day | ORAL | 3 refills | Status: DC

## 2016-08-30 ENCOUNTER — Ambulatory Visit
Admission: RE | Admit: 2016-08-30 | Discharge: 2016-08-30 | Disposition: A | Discharge status: Home / Self Care | Payer: PPO | Source: Ambulatory Visit | Attending: Family Medicine | Admitting: Family Medicine

## 2016-08-30 DIAGNOSIS — Z1231 Encounter for screening mammogram for malignant neoplasm of breast: Secondary | ICD-10-CM | POA: Diagnosis not present

## 2016-09-07 ENCOUNTER — Ambulatory Visit (INDEPENDENT_AMBULATORY_CARE_PROVIDER_SITE_OTHER): Payer: PPO | Admitting: Family Medicine

## 2016-09-07 ENCOUNTER — Encounter: Payer: Self-pay | Admitting: Family Medicine

## 2016-09-07 DIAGNOSIS — F410 Panic disorder [episodic paroxysmal anxiety] without agoraphobia: Secondary | ICD-10-CM

## 2016-09-07 DIAGNOSIS — F99 Mental disorder, not otherwise specified: Secondary | ICD-10-CM

## 2016-09-07 DIAGNOSIS — J42 Unspecified chronic bronchitis: Secondary | ICD-10-CM | POA: Diagnosis not present

## 2016-09-07 DIAGNOSIS — F5105 Insomnia due to other mental disorder: Secondary | ICD-10-CM | POA: Diagnosis not present

## 2016-09-07 MED ORDER — ALPRAZOLAM 1 MG PO TABS: 1.0000 mg | ORAL_TABLET | Freq: Every evening | ORAL | 0 refills | Status: DC | PRN

## 2016-09-07 MED ORDER — ALBUTEROL SULFATE HFA 108 (90 BASE) MCG/ACT IN AERS: 2.0000 | INHALATION_SPRAY | Freq: Four times a day (QID) | RESPIRATORY_TRACT | 2 refills | Status: DC | PRN

## 2016-09-07 MED ORDER — FLUTICASONE FUROATE-VILANTEROL 100-25 MCG/INH IN AEPB: INHALATION_SPRAY | RESPIRATORY_TRACT | 0 refills | Status: DC

## 2016-09-07 MED ORDER — ALPRAZOLAM 0.5 MG PO TABS: 0.5000 mg | ORAL_TABLET | Freq: Three times a day (TID) | ORAL | 0 refills | Status: DC | PRN

## 2016-09-07 MED ORDER — PREDNISONE 5 MG (21) PO TBPK: ORAL_TABLET | ORAL | 0 refills | Status: DC

## 2016-09-07 NOTE — Progress Notes (Signed)
Name: [name]   MRN: [id number]    DOB: 1951/01/07   Date:09/07/2016       Progress Note  Subjective  Chief Complaint  Chief Complaint  Patient presents with  . Follow-up    1 mo  . Medication Refill    Anxiety  Presents for follow-up visit. Symptoms include depressed mood, excessive worry, insomnia, nervous/anxious behavior and panic. The severity of symptoms is moderate and causing significant distress.    Insomnia  Primary symptoms: frequent awakening, premature morning awakening.  The problem occurs nightly. The symptoms are aggravated by anxiety. Past treatments include medication. Typical bedtime:  11-12 P.M..  How long after going to bed to you fall asleep: 15-30 minutes.   PMH includes: hypertension, depression, chronic pain.  Nicotine Dependence  Presents for initial visit. Symptoms include cravings and insomnia. (Habits and craving) Preferred tobacco types include cigarettes. Preferred cigarette types include filtered. Preferred strength is light. Preferred cigarettes are non-menthol. Preferred brands include Marlboro. Her urge triggers include company of smokers, drinking coffee, driving and meal time. Her first smoke is from 8 to 10 AM. She smokes 1 pack of cigarettes per day. She started smoking when she was 74-84 years old. Past treatments include nothing. Compliance with prior treatments has been good. Alliya is thinking about quitting. Rula has tried to quit 1 (she did not smoke for 2 months after she got sick and was admitted to the hospital and NH) time.   COPD: Pt. Has COPD described as mostly wheezing, occasional dyspnea, and some coughing. SHe has been on Breo and ALbuterol, has reportedly ran out of both inhalers and now complains of increased wheezing, no dyspnea, fever, chills etc.   Past Medical History:  Diagnosis Date  . Anxiety   . Arthritis   . Chronic back pain   . Depression   . GERD (gastroesophageal reflux disease)   . Hypertension   .  Hypothyroidism   . Pneumonia    RECENT X 3  . PONV (postoperative nausea and vomiting)   . Psoriasis   . Thyroid disease     Past Surgical History:  Procedure Laterality Date  . ABDOMINAL HYSTERECTOMY     heavy bleeding  . bladder stimulator    . CARPAL TUNNEL RELEASE    . CATARACT EXTRACTION W/PHACO Right 03/07/2015   Procedure: CATARACT EXTRACTION PHACO AND INTRAOCULAR LENS PLACEMENT (IOC);  Surgeon: Estill Cotta, MD;  Location: [facility];  Service: Ophthalmology;  Laterality: Right;  [name] 01:14 AP% 26.4 CDE 33.04 fluid pack lot # [id number]  . CATARACT EXTRACTION W/PHACO Left 03/21/2015   Procedure: CATARACT EXTRACTION PHACO AND INTRAOCULAR LENS PLACEMENT (IOC);  Surgeon: Estill Cotta, MD;  Location: [facility];  Service: Ophthalmology;  Laterality: Left;  [name] 01:21 AP% 25.6 CDE 39.78 fluid pack lot # [id number]  . HAND SURGERY Right   . INTERSTIM IMPLANT REMOVAL N/A 09/07/2015   Procedure: REMOVAL OF INTERSTIM IMPLANT;  Surgeon: Bjorn Loser, MD;  Location: [facility];  Service: Urology;  Laterality: N/A;  . OOPHORECTOMY Right    ovarian cyst    Family History  Problem Relation Age of Onset  . Heart disease Mother   . Breast cancer Mother   . COPD Father   . Kidney disease Sister   . Breast cancer Cousin   . Breast cancer Cousin   . Kidney cancer Neg Hx   . Bladder Cancer Neg Hx     Social History   Social History  . Marital status: Widowed  Spouse name: N/A  . Number of children: N/A  . Years of education: N/A   Occupational History  . Not on file.   Social History Main Topics  . Smoking status: Current Every Day Smoker    Packs/day: 1.00    Types: Cigarettes  . Smokeless tobacco: Never Used  . Alcohol use No  . Drug use: No  . Sexual activity: No   Other Topics Concern  . Not on file   Social History Narrative  . No narrative on file     Current Outpatient Prescriptions:  .  acetaminophen (TYLENOL) 500 MG tablet, Take 500 mg by mouth every  6 (six) hours as needed for moderate pain. Reported on 07/01/2015, Disp: , Rfl:  .  albuterol (PROVENTIL HFA;VENTOLIN HFA) 108 (90 Base) MCG/ACT inhaler, Inhale 2 puffs into the lungs every 6 (six) hours as needed for wheezing or shortness of breath., Disp: 1 Inhaler, Rfl: 2 .  ALPRAZolam (XANAX) 0.5 MG tablet, Take 1 tablet (0.5 mg total) by mouth 3 (three) times daily as needed for anxiety., Disp: 90 tablet, Rfl: 0 .  ALPRAZolam (XANAX) 1 MG tablet, Take 1 tablet (1 mg total) by mouth at bedtime as needed for anxiety., Disp: 30 tablet, Rfl: 0 .  betamethasone dipropionate (DIPROLENE) 0.05 % ointment, , Disp: , Rfl:  .  BREO ELLIPTA 100-25 MCG/INH AEPB, INHALE 1 PUFF BY MOUTH INTO THE LUNGS ONCE DAILY, Disp: 60 each, Rfl: 0 .  citalopram (CELEXA) 20 MG tablet, TAKE ONE TABLET BY MOUTH EVERY DAY, Disp: 90 tablet, Rfl: 0 .  diltiazem (CARDIZEM CD) 120 MG 24 hr capsule, TAKE ONE CAPSULE BY MOUTH EVERY DAY, Disp: 90 capsule, Rfl: 0 .  hydrOXYzine (ATARAX/VISTARIL) 25 MG tablet, , Disp: , Rfl:  .  levalbuterol (XOPENEX) 1.25 MG/0.5ML nebulizer solution, Take 1.25 mg by nebulization every 6 (six) hours., Disp: 1 each, Rfl: 12 .  levothyroxine (SYNTHROID) 100 MCG tablet, Take 1 tablet (100 mcg total) by mouth daily with breakfast., Disp: 90 tablet, Rfl: 1 .  lisinopril (PRINIVIL,ZESTRIL) 10 MG tablet, TAKE 1/2 TABLET BY MOUTH EVERY MORNING, Disp: 15 tablet, Rfl: 0 .  lisinopril (PRINIVIL,ZESTRIL) 10 MG tablet, TAKE 1/2 TABLET BY MOUTH EVERY MORNING, Disp: 90 tablet, Rfl: 0 .  mirabegron ER (MYRBETRIQ) 25 MG TB24 tablet, Take 1 tablet (25 mg total) by mouth daily., Disp: 90 tablet, Rfl: 3 .  omeprazole (PRILOSEC) 20 MG capsule, Take 1 capsule (20 mg total) by mouth daily., Disp: 30 capsule, Rfl: 0 .  Secukinumab (COSENTYX [place]), Inject into the skin every 30 (thirty) days. 2 injections per month, Disp: , Rfl:  .  triamcinolone (KENALOG) 0.025 % ointment, , Disp: , Rfl:  .  amoxicillin-clavulanate (AUGMENTIN)  875-125 MG tablet, Take 1 tablet by mouth every 12 (twelve) hours. (Patient not taking: Reported on 09/07/2016), Disp: 14 tablet, Rfl: 0 .  pravastatin (PRAVACHOL) 20 MG tablet, Take by mouth., Disp: , Rfl:   Allergies  Allergen Reactions  . Ciprofloxacin Hives and Diarrhea  . Oxycodone Diarrhea  . Oxycodone-Acetaminophen Diarrhea  . Oxycodone-Acetaminophen Diarrhea  . Sulfa Antibiotics Rash    Throat, mouth Throat, mouth     Review of Systems  Psychiatric/Behavioral: Positive for depression. The patient is nervous/anxious and has insomnia.       Objective  Vitals:   09/07/16 1010  BP: 135/69  Pulse: (!) 116  Resp: 17  Temp: 98.2 F (36.8 C)  TempSrc: Oral  SpO2: 93%  Weight: 178 lb 9.6 oz (  81 kg)  Height: 5\' 3"  (1.6 m)    Physical Exam  Constitutional: She is oriented to person, place, and time and well-developed, well-nourished, and in no distress.  Cardiovascular: Normal rate, regular rhythm and normal heart sounds.   No murmur heard. Pulmonary/Chest: Effort normal. She has wheezes in the right middle field, the right lower field, the left middle field and the left lower field. She has no rhonchi. She has no rales.  Abdominal: Soft. There is no tenderness.  Neurological: She is alert and oriented to person, place, and time.  Psychiatric: Memory, affect and judgment normal. Her mood appears anxious.  Nursing note and vitals reviewed.     Assessment & Plan  1. Insomnia due to other mental disorder Stable and responsive to alprazolam taken at bedtime as needed, patient compliant with controlled substances agreement and understands the dependence potential, side effects and drug interactions of benzodiazepines. Refills provided - ALPRAZolam (XANAX) 1 MG tablet; Take 1 tablet (1 mg total) by mouth at bedtime as needed for anxiety.  Dispense: 30 tablet; Refill: 0  2. Panic disorder without agoraphobia Ago, taking alprazolam as needed up to 3 times daily for episodes  of panic attacks - ALPRAZolam (XANAX) 0.5 MG tablet; Take 1 tablet (0.5 mg total) by mouth 3 (three) times daily as needed for anxiety.  Dispense: 90 tablet; Refill: 0  3. Chronic bronchitis, unspecified chronic bronchitis type (HCC) Refills of both ICS-LABA and rescue inhaler, because of concern for acute exacerbation, we'll start on 6 day course of prednisone. No evidence of infection, however if her symptoms get worse, may obtain chest x-ray and add antimicrobial treatment - fluticasone furoate-vilanterol (BREO ELLIPTA) 100-25 MCG/INH AEPB; INHALE 1 PUFF BY MOUTH INTO THE LUNGS ONCE DAILY  Dispense: 60 each; Refill: 0 - albuterol (PROVENTIL HFA;VENTOLIN HFA) 108 (90 Base) MCG/ACT inhaler; Inhale 2 puffs into the lungs every 6 (six) hours as needed for wheezing or shortness of breath.  Dispense: 3 Inhaler; Refill: 2 - predniSONE (STERAPRED UNI-PAK 21 TAB) 5 MG (21) TBPK tablet; 60 50 40 30 20 10  then STOP  Dispense: 21 tablet; Refill: 0   Jaymie Misch [facility] 09/07/2016 10:31 AM

## 2016-10-01 ENCOUNTER — Encounter: Payer: Self-pay | Admitting: Family Medicine

## 2016-10-02 ENCOUNTER — Encounter: Payer: PPO | Admitting: Obstetrics & Gynecology

## 2016-10-10 ENCOUNTER — Ambulatory Visit (INDEPENDENT_AMBULATORY_CARE_PROVIDER_SITE_OTHER): Payer: PPO | Admitting: Family Medicine

## 2016-10-10 ENCOUNTER — Encounter: Payer: Self-pay | Admitting: Family Medicine

## 2016-10-10 VITALS — BP 132/71 | HR 119 | Temp 98.4°F | Resp 17 | Ht 63.0 in | Wt 178.5 lb

## 2016-10-10 DIAGNOSIS — I10 Essential (primary) hypertension: Secondary | ICD-10-CM

## 2016-10-10 DIAGNOSIS — F410 Panic disorder [episodic paroxysmal anxiety] without agoraphobia: Secondary | ICD-10-CM | POA: Diagnosis not present

## 2016-10-10 DIAGNOSIS — F99 Mental disorder, not otherwise specified: Secondary | ICD-10-CM | POA: Diagnosis not present

## 2016-10-10 DIAGNOSIS — F5105 Insomnia due to other mental disorder: Secondary | ICD-10-CM | POA: Diagnosis not present

## 2016-10-10 MED ORDER — ALPRAZOLAM 1 MG PO TABS: 1.0000 mg | ORAL_TABLET | Freq: Every evening | ORAL | 0 refills | Status: DC | PRN

## 2016-10-10 MED ORDER — ALPRAZOLAM 0.5 MG PO TABS: 0.5000 mg | ORAL_TABLET | Freq: Three times a day (TID) | ORAL | 0 refills | Status: DC | PRN

## 2016-10-10 NOTE — Progress Notes (Signed)
Name: [name]   MRN: [id number]    DOB: 1950/10/18   Date:10/10/2016       Progress Note  Subjective  Chief Complaint  Chief Complaint  Patient presents with  . Medication Refill    Anxiety  Presents for follow-up visit. Symptoms include depressed mood, excessive worry, insomnia, muscle tension, nervous/anxious behavior and panic. Patient reports no chest pain, malaise, palpitations, shortness of breath or suicidal ideas. The severity of symptoms is moderate and causing significant distress. The quality of sleep is good.    Insomnia  Primary symptoms: no difficulty falling asleep, no frequent awakening.  The onset quality is gradual. The problem occurs nightly. The symptoms are aggravated by anxiety and pain. Typical bedtime:  11-12 P.M..  How long after going to bed to you fall asleep: 15-30 minutes.   PMH includes: hypertension, depression, family stress or anxiety, chronic pain.  Hypertension  This is a chronic problem. The problem is unchanged. Associated symptoms include anxiety. Pertinent negatives include no blurred vision, chest pain, headaches, palpitations or shortness of breath. Past treatments include ACE inhibitors and calcium channel blockers.     Past Medical History:  Diagnosis Date  . Anxiety   . Arthritis   . Chronic back pain   . Depression   . GERD (gastroesophageal reflux disease)   . Hypertension   . Hypothyroidism   . Pneumonia    RECENT X 3  . PONV (postoperative nausea and vomiting)   . Psoriasis   . Thyroid disease     Past Surgical History:  Procedure Laterality Date  . ABDOMINAL HYSTERECTOMY     heavy bleeding  . bladder stimulator    . CARPAL TUNNEL RELEASE    . CATARACT EXTRACTION W/PHACO Right 03/07/2015   Procedure: CATARACT EXTRACTION PHACO AND INTRAOCULAR LENS PLACEMENT (IOC);  Surgeon: Estill Cotta, MD;  Location: [facility];  Service: Ophthalmology;  Laterality: Right;  [name] 01:14 AP% 26.4 CDE 33.04 fluid pack lot # [id number]   . CATARACT EXTRACTION W/PHACO Left 03/21/2015   Procedure: CATARACT EXTRACTION PHACO AND INTRAOCULAR LENS PLACEMENT (IOC);  Surgeon: Estill Cotta, MD;  Location: [facility];  Service: Ophthalmology;  Laterality: Left;  [name] 01:21 AP% 25.6 CDE 39.78 fluid pack lot # [id number]  . HAND SURGERY Right   . INTERSTIM IMPLANT REMOVAL N/A 09/07/2015   Procedure: REMOVAL OF INTERSTIM IMPLANT;  Surgeon: Bjorn Loser, MD;  Location: [facility];  Service: Urology;  Laterality: N/A;  . OOPHORECTOMY Right    ovarian cyst    Family History  Problem Relation Age of Onset  . Heart disease Mother   . Breast cancer Mother   . COPD Father   . Kidney disease Sister   . Breast cancer Cousin   . Breast cancer Cousin   . Kidney cancer Neg Hx   . Bladder Cancer Neg Hx     Social History   Social History  . Marital status: Widowed    Spouse name: N/A  . Number of children: N/A  . Years of education: N/A   Occupational History  . Not on file.   Social History Main Topics  . Smoking status: Current Every Day Smoker    Packs/day: 1.00    Types: Cigarettes  . Smokeless tobacco: Never Used  . Alcohol use No  . Drug use: No  . Sexual activity: No   Other Topics Concern  . Not on file   Social History Narrative  . No narrative on file  Current Outpatient Prescriptions:  .  acetaminophen (TYLENOL) 500 MG tablet, Take 500 mg by mouth every 6 (six) hours as needed for moderate pain. Reported on 07/01/2015, Disp: , Rfl:  .  albuterol (PROVENTIL HFA;VENTOLIN HFA) 108 (90 Base) MCG/ACT inhaler, Inhale 2 puffs into the lungs every 6 (six) hours as needed for wheezing or shortness of breath., Disp: 3 Inhaler, Rfl: 2 .  ALPRAZolam (XANAX) 0.5 MG tablet, Take 1 tablet (0.5 mg total) by mouth 3 (three) times daily as needed for anxiety., Disp: 90 tablet, Rfl: 0 .  ALPRAZolam (XANAX) 1 MG tablet, Take 1 tablet (1 mg total) by mouth at bedtime as needed for anxiety., Disp: 30 tablet, Rfl: 0 .   betamethasone dipropionate (DIPROLENE) 0.05 % ointment, , Disp: , Rfl:  .  citalopram (CELEXA) 20 MG tablet, TAKE ONE TABLET BY MOUTH EVERY DAY, Disp: 90 tablet, Rfl: 0 .  diltiazem (CARDIZEM CD) 120 MG 24 hr capsule, TAKE ONE CAPSULE BY MOUTH EVERY DAY, Disp: 90 capsule, Rfl: 0 .  fluticasone furoate-vilanterol (BREO ELLIPTA) 100-25 MCG/INH AEPB, INHALE 1 PUFF BY MOUTH INTO THE LUNGS ONCE DAILY, Disp: 60 each, Rfl: 0 .  hydrOXYzine (ATARAX/VISTARIL) 25 MG tablet, , Disp: , Rfl:  .  levalbuterol (XOPENEX) 1.25 MG/0.5ML nebulizer solution, Take 1.25 mg by nebulization every 6 (six) hours., Disp: 1 each, Rfl: 12 .  levothyroxine (SYNTHROID) 100 MCG tablet, Take 1 tablet (100 mcg total) by mouth daily with breakfast., Disp: 90 tablet, Rfl: 1 .  lisinopril (PRINIVIL,ZESTRIL) 10 MG tablet, TAKE 1/2 TABLET BY MOUTH EVERY MORNING, Disp: 15 tablet, Rfl: 0 .  lisinopril (PRINIVIL,ZESTRIL) 10 MG tablet, TAKE 1/2 TABLET BY MOUTH EVERY MORNING, Disp: 90 tablet, Rfl: 0 .  mirabegron ER (MYRBETRIQ) 25 MG TB24 tablet, Take 1 tablet (25 mg total) by mouth daily., Disp: 90 tablet, Rfl: 3 .  omeprazole (PRILOSEC) 20 MG capsule, Take 1 capsule (20 mg total) by mouth daily., Disp: 30 capsule, Rfl: 0 .  Secukinumab (COSENTYX [place]), Inject into the skin every 30 (thirty) days. 2 injections per month, Disp: , Rfl:  .  triamcinolone (KENALOG) 0.025 % ointment, , Disp: , Rfl:  .  pravastatin (PRAVACHOL) 20 MG tablet, Take by mouth., Disp: , Rfl:   Allergies  Allergen Reactions  . Ciprofloxacin Hives and Diarrhea  . Oxycodone Diarrhea  . Oxycodone-Acetaminophen Diarrhea  . Oxycodone-Acetaminophen Diarrhea  . Sulfa Antibiotics Rash    Throat, mouth Throat, mouth     Review of Systems  Eyes: Negative for blurred vision.  Respiratory: Negative for shortness of breath.   Cardiovascular: Negative for chest pain and palpitations.  Neurological: Negative for headaches.  Psychiatric/Behavioral: Positive for depression.  Negative for suicidal ideas. The patient is nervous/anxious and has insomnia.       Objective  Vitals:   10/10/16 1417  BP: 132/71  Pulse: (!) 119  Resp: 17  Temp: 98.4 F (36.9 C)  TempSrc: Oral  SpO2: 95%  Weight: 178 lb 8 oz (81 kg)  Height: 5\' 3"  (1.6 m)    Physical Exam  Constitutional: She is oriented to person, place, and time and well-developed, well-nourished, and in no distress.  HENT:  Head: Normocephalic and atraumatic.  Cardiovascular: Normal rate, regular rhythm and normal heart sounds.   No murmur heard. Pulmonary/Chest: Effort normal and breath sounds normal. She has no wheezes.  Neurological: She is alert and oriented to person, place, and time.  Psychiatric: Mood, memory, affect and judgment normal.  Nursing note and vitals  reviewed.      Recent Results (from the past 2160 hour(s))  Urinalysis, Complete     Status: Abnormal   Collection Time: 07/26/16  2:45 PM  Result Value Ref Range   Specific Gravity, UA 1.015 1.005 - 1.030   pH, UA 6.0 5.0 - 7.5   Color, UA Yellow Yellow   Appearance Ur Clear Clear   Leukocytes, UA 3+ (A) Negative   Protein, UA Negative Negative/Trace   Glucose, UA Negative Negative   Ketones, UA Negative Negative   RBC, UA Trace (A) Negative   Bilirubin, UA Negative Negative   Urobilinogen, Ur 1.0 0.2 - 1.0 mg/dL   Nitrite, UA Positive (A) Negative   Microscopic Examination See below:   Microscopic Examination     Status: Abnormal   Collection Time: 07/26/16  2:45 PM  Result Value Ref Range   WBC, UA 11-30W 0 - 5 /hpf   RBC, UA None seen 0 - 2 /hpf   Epithelial Cells (non renal) 0-10 0 - 10 /hpf   Bacteria, UA Many (A) None seen/Few  BLADDER SCAN AMB NON-IMAGING     Status: None   Collection Time: 07/26/16  2:49 PM  Result Value Ref Range   Scan Result 202   CULTURE, URINE COMPREHENSIVE     Status: Abnormal   Collection Time: 07/26/16  3:04 PM  Result Value Ref Range   Urine Culture, Comprehensive Final report  (A)    Organism ID, Bacteria Klebsiella pneumoniae (A)     Comment: Greater than 100,000 colony forming units per mL Cefazolin <=4 ug/mL Cefazolin with an MIC <=16 predicts susceptibility to the oral agents cefaclor, cefdinir, cefpodoxime, cefprozil, cefuroxime, cephalexin, and loracarbef when used for therapy of uncomplicated urinary tract infections due to E. coli, Klebsiella pneumoniae, and Proteus mirabilis.    ANTIMICROBIAL SUSCEPTIBILITY Comment     Comment:       ** S = Susceptible; I = Intermediate; R = Resistant **                    P = Positive; N = Negative             MICS are expressed in micrograms per mL    Antibiotic                 RSLT#1    RSLT#2    RSLT#3    RSLT#4 Amoxicillin/Clavulanic Acid    S Ampicillin                     R Cefepime                       S Ceftriaxone                    S Cefuroxime                     S Ciprofloxacin                  S Ertapenem                      S Gentamicin                     S Imipenem                       S Levofloxacin  S Meropenem                      S Nitrofurantoin                 S Piperacillin/Tazobactam        S Tetracycline                   S Tobramycin                     S Trimethoprim/Sulfa             S   BLADDER SCAN AMB NON-IMAGING     Status: None   Collection Time: 08/29/16  9:57 AM  Result Value Ref Range   Scan Result 58      Assessment & Plan  1. Insomnia due to other mental disorder Stable and responsive to alprazolam taken at bedtime as needed, patient compliant with controlled substances agreement, refills provided - ALPRAZolam (XANAX) 1 MG tablet; Take 1 tablet (1 mg total) by mouth at bedtime as needed for anxiety.  Dispense: 30 tablet; Refill: 0  2. Panic disorder without agoraphobia In terms of anxiety are stable and responsive to alprazolam taken up to 3 times daily as needed - ALPRAZolam (XANAX) 0.5 MG tablet; Take 1 tablet (0.5 mg total) by mouth 3  (three) times daily as needed for anxiety.  Dispense: 90 tablet; Refill: 0  3. Benign essential HTN BP stable on present antihypertensive treatment   Nykiah Ma Asad A. [facility] 10/10/2016 2:23 PM

## 2016-10-29 ENCOUNTER — Encounter: Payer: PPO | Admitting: Obstetrics & Gynecology

## 2016-11-06 ENCOUNTER — Ambulatory Visit (INDEPENDENT_AMBULATORY_CARE_PROVIDER_SITE_OTHER): Payer: PPO | Admitting: Family Medicine

## 2016-11-06 ENCOUNTER — Encounter: Payer: Self-pay | Admitting: Family Medicine

## 2016-11-06 VITALS — BP 156/78 | HR 115 | Temp 98.1°F | Ht 63.0 in | Wt 179.0 lb

## 2016-11-06 DIAGNOSIS — Z23 Encounter for immunization: Secondary | ICD-10-CM | POA: Diagnosis not present

## 2016-11-06 DIAGNOSIS — F410 Panic disorder [episodic paroxysmal anxiety] without agoraphobia: Secondary | ICD-10-CM | POA: Diagnosis not present

## 2016-11-06 DIAGNOSIS — F5105 Insomnia due to other mental disorder: Secondary | ICD-10-CM | POA: Diagnosis not present

## 2016-11-06 DIAGNOSIS — J42 Unspecified chronic bronchitis: Secondary | ICD-10-CM

## 2016-11-06 DIAGNOSIS — F99 Mental disorder, not otherwise specified: Secondary | ICD-10-CM

## 2016-11-06 DIAGNOSIS — I10 Essential (primary) hypertension: Secondary | ICD-10-CM | POA: Diagnosis not present

## 2016-11-06 MED ORDER — LISINOPRIL 10 MG PO TABS: 10.0000 mg | ORAL_TABLET | Freq: Every day | ORAL | 0 refills | Status: DC

## 2016-11-06 MED ORDER — DILTIAZEM HCL ER COATED BEADS 120 MG PO CP24: 120.0000 mg | ORAL_CAPSULE | Freq: Every day | ORAL | 0 refills | Status: DC

## 2016-11-06 MED ORDER — ALPRAZOLAM 0.5 MG PO TABS: 0.5000 mg | ORAL_TABLET | Freq: Three times a day (TID) | ORAL | 0 refills | Status: DC | PRN

## 2016-11-06 MED ORDER — ALPRAZOLAM 1 MG PO TABS: 1.0000 mg | ORAL_TABLET | Freq: Every evening | ORAL | 0 refills | Status: DC | PRN

## 2016-11-06 MED ORDER — ALBUTEROL SULFATE HFA 108 (90 BASE) MCG/ACT IN AERS: 2.0000 | INHALATION_SPRAY | Freq: Four times a day (QID) | RESPIRATORY_TRACT | 2 refills | Status: DC | PRN

## 2016-11-06 NOTE — Progress Notes (Signed)
Name: [name]   MRN: [id number]    DOB: [birth year]   Date:11/06/2016       Progress Note  Subjective  Chief Complaint  Chief Complaint  Patient presents with  . Annual Exam  . Blood Pressure Issues    Pt states that she had an episode of hypotension felt faint  . Medication Refill    would like xanax today dated for the 5th, due to transportation   . Immunizations    flu shot today     Hypertension  This is a recurrent problem. The problem has been gradually worsening (blood pressure was 57/47 mmHg 34 days ago, felt light-headed, dizzy, and sleepy. Then it went up to 147/70) since onset. Associated symptoms include anxiety and headaches. Pertinent negatives include no blurred vision, chest pain, palpitations or shortness of breath. Past treatments include calcium channel blockers and ACE inhibitors. There is no history of kidney disease, CAD/MI or CVA.  Anxiety  Presents for follow-up visit. Symptoms include depressed mood, excessive worry, insomnia, irritability, nervous/anxious behavior and panic. Patient reports no chest pain, palpitations or shortness of breath. The severity of symptoms is moderate and causing significant distress.    Insomnia  Primary symptoms: sleep disturbance.  The onset quality is gradual. The problem occurs nightly. Past treatments include medication. Typical bedtime:  10-11 P.M..  How long after going to bed to you fall asleep: 15-30 minutes.        Past Medical History:  Diagnosis Date  . Anxiety   . Arthritis   . Chronic back pain   . Depression   . GERD (gastroesophageal reflux disease)   . Hypertension   . Hypothyroidism   . Pneumonia    RECENT X 3  . PONV (postoperative nausea and vomiting)   . Psoriasis   . Thyroid disease     Past Surgical History:  Procedure Laterality Date  . ABDOMINAL HYSTERECTOMY     heavy bleeding  . bladder stimulator    . CARPAL TUNNEL RELEASE    . CATARACT EXTRACTION W/PHACO Right 03/07/2015   Procedure: CATARACT EXTRACTION PHACO AND INTRAOCULAR LENS PLACEMENT (IOC);  Surgeon: Estill Cotta, MD;  Location: [facility];  Service: Ophthalmology;  Laterality: Right;  [name] 01:14 AP% 26.4 CDE 33.04 fluid pack lot # [id number]  . CATARACT EXTRACTION W/PHACO Left 03/21/2015   Procedure: CATARACT EXTRACTION PHACO AND INTRAOCULAR LENS PLACEMENT (IOC);  Surgeon: Estill Cotta, MD;  Location: [facility];  Service: Ophthalmology;  Laterality: Left;  [name] 01:21 AP% 25.6 CDE 39.78 fluid pack lot # [id number]  . HAND SURGERY Right   . INTERSTIM IMPLANT REMOVAL N/A 09/07/2015   Procedure: REMOVAL OF INTERSTIM IMPLANT;  Surgeon: Bjorn Loser, MD;  Location: [facility];  Service: Urology;  Laterality: N/A;  . OOPHORECTOMY Right    ovarian cyst    Family History  Problem Relation Age of Onset  . Heart disease Mother   . Breast cancer Mother   . COPD Father   . Kidney disease Sister   . Breast cancer Cousin   . Breast cancer Cousin   . Kidney cancer Neg Hx   . Bladder Cancer Neg Hx     Social History   Social History  . Marital status: Widowed    Spouse name: N/A  . Number of children: N/A  . Years of education: N/A   Occupational History  . Not on file.   Social History Main Topics  . Smoking status: Current Every Day Smoker  Packs/day: 1.00    Types: Cigarettes  . Smokeless tobacco: Never Used  . Alcohol use No  . Drug use: No  . Sexual activity: No   Other Topics Concern  . Not on file   Social History Narrative  . No narrative on file     Current Outpatient Prescriptions:  .  acetaminophen (TYLENOL) 500 MG tablet, Take 500 mg by mouth every 6 (six) hours as needed for moderate pain. Reported on 07/01/2015, Disp: , Rfl:  .  albuterol (PROVENTIL HFA;VENTOLIN HFA) 108 (90 Base) MCG/ACT inhaler, Inhale 2 puffs into the lungs every 6 (six) hours as needed for wheezing or shortness of breath., Disp: 3 Inhaler, Rfl: 2 .  ALPRAZolam (XANAX) 0.5 MG tablet, Take 1 tablet (0.5  mg total) by mouth 3 (three) times daily as needed for anxiety., Disp: 90 tablet, Rfl: 0 .  ALPRAZolam (XANAX) 1 MG tablet, Take 1 tablet (1 mg total) by mouth at bedtime as needed for anxiety., Disp: 30 tablet, Rfl: 0 .  betamethasone dipropionate (DIPROLENE) 0.05 % ointment, , Disp: , Rfl:  .  citalopram (CELEXA) 20 MG tablet, TAKE ONE TABLET BY MOUTH EVERY DAY, Disp: 90 tablet, Rfl: 0 .  diltiazem (CARDIZEM CD) 120 MG 24 hr capsule, TAKE ONE CAPSULE BY MOUTH EVERY DAY, Disp: 90 capsule, Rfl: 0 .  fluticasone furoate-vilanterol (BREO ELLIPTA) 100-25 MCG/INH AEPB, INHALE 1 PUFF BY MOUTH INTO THE LUNGS ONCE DAILY, Disp: 60 each, Rfl: 0 .  hydrOXYzine (ATARAX/VISTARIL) 25 MG tablet, , Disp: , Rfl:  .  levalbuterol (XOPENEX) 1.25 MG/0.5ML nebulizer solution, Take 1.25 mg by nebulization every 6 (six) hours., Disp: 1 each, Rfl: 12 .  levothyroxine (SYNTHROID) 100 MCG tablet, Take 1 tablet (100 mcg total) by mouth daily with breakfast., Disp: 90 tablet, Rfl: 1 .  lisinopril (PRINIVIL,ZESTRIL) 10 MG tablet, TAKE 1/2 TABLET BY MOUTH EVERY MORNING, Disp: 15 tablet, Rfl: 0 .  mirabegron ER (MYRBETRIQ) 25 MG TB24 tablet, Take 1 tablet (25 mg total) by mouth daily., Disp: 90 tablet, Rfl: 3 .  omeprazole (PRILOSEC) 20 MG capsule, Take 1 capsule (20 mg total) by mouth daily., Disp: 30 capsule, Rfl: 0 .  Secukinumab (COSENTYX [place]), Inject into the skin every 30 (thirty) days. 2 injections per month, Disp: , Rfl:  .  triamcinolone (KENALOG) 0.025 % ointment, , Disp: , Rfl:  .  pravastatin (PRAVACHOL) 20 MG tablet, Take by mouth., Disp: , Rfl:   Allergies  Allergen Reactions  . Ciprofloxacin Hives and Diarrhea  . Oxycodone Diarrhea  . Oxycodone-Acetaminophen Diarrhea  . Oxycodone-Acetaminophen Diarrhea  . Sulfa Antibiotics Rash    Throat, mouth Throat, mouth     Review of Systems  Constitutional: Positive for irritability.  Eyes: Negative for blurred vision.  Respiratory: Negative for shortness of  breath.   Cardiovascular: Negative for chest pain and palpitations.  Neurological: Positive for headaches.  Psychiatric/Behavioral: Positive for sleep disturbance. The patient is nervous/anxious and has insomnia.       Objective  Vitals:   11/06/16 1415  BP: (!) 146/86  Pulse: (!) 115  Temp: 98.1 F (36.7 C)  TempSrc: Oral  SpO2: (!) 89%  Weight: 179 lb (81.2 kg)  Height: 5\' 3"  (1.6 m)    Physical Exam  Constitutional: She is oriented to person, place, and time and well-developed, well-nourished, and in no distress.  Cardiovascular: Normal rate, regular rhythm and normal heart sounds.   No murmur heard. Pulmonary/Chest: Effort normal and breath sounds normal. She has no  wheezes.  Musculoskeletal:       Right ankle: She exhibits no swelling.       Left ankle: She exhibits no swelling.  Neurological: She is alert and oriented to person, place, and time.  Psychiatric: Memory, affect and judgment normal. Her mood appears anxious.  Nursing note and vitals reviewed.      Recent Results (from the past 2160 hour(s))  BLADDER SCAN AMB NON-IMAGING     Status: None   Collection Time: 08/29/16  9:57 AM  Result Value Ref Range   Scan Result 58      Assessment & Plan  1. Flu vaccine need  - Flu vaccine HIGH DOSE PF (Fluzone High dose)  2. Benign essential HTN Elevated blood pressure on manual repeat, increase lisinopril to 10 mg daily, reassess in one month - diltiazem (CARDIZEM CD) 120 MG 24 hr capsule; Take 1 capsule (120 mg total) by mouth daily.  Dispense: 90 capsule; Refill: 0 - lisinopril (PRINIVIL,ZESTRIL) 10 MG tablet; Take 1 tablet (10 mg total) by mouth daily.  Dispense: 90 tablet; Refill: 0  3. Insomnia due to other mental disorder Table responsive to alprazolam taken at bedtime as needed - ALPRAZolam (XANAX) 1 MG tablet; Take 1 tablet (1 mg total) by mouth at bedtime as needed for anxiety.  Dispense: 30 tablet; Refill: 0  4. Panic disorder without  agoraphobia Stable and responsive to alprazolam taken up to 3 times daily as needed, patient compliant with controlled substances agreement and understands the dependence potential, side effects and drug interactions of benzodiazepines. Refills provided - ALPRAZolam (XANAX) 0.5 MG tablet; Take 1 tablet (0.5 mg total) by mouth 3 (three) times daily as needed for anxiety.  Dispense: 90 tablet; Refill: 0  5. Chronic bronchitis, unspecified chronic bronchitis type (HCC)  - albuterol (PROVENTIL HFA;VENTOLIN HFA) 108 (90 Base) MCG/ACT inhaler; Inhale 2 puffs into the lungs every 6 (six) hours as needed for wheezing or shortness of breath.  Dispense: 3 Inhaler; Refill: 2  Sharmila Wrobleski Asad A. [facility] 11/06/2016 2:27 PM

## 2016-11-08 ENCOUNTER — Ambulatory Visit (INDEPENDENT_AMBULATORY_CARE_PROVIDER_SITE_OTHER): Payer: PPO | Admitting: Obstetrics & Gynecology

## 2016-11-08 ENCOUNTER — Encounter: Payer: Self-pay | Admitting: Obstetrics & Gynecology

## 2016-11-08 VITALS — BP 120/80 | HR 89 | Ht 63.0 in | Wt 180.0 lb

## 2016-11-08 DIAGNOSIS — N8111 Cystocele, midline: Secondary | ICD-10-CM | POA: Diagnosis not present

## 2016-11-08 DIAGNOSIS — N952 Postmenopausal atrophic vaginitis: Secondary | ICD-10-CM

## 2016-11-08 MED ORDER — ESTROGENS, CONJUGATED 0.625 MG/GM VA CREA: 1.0000 | TOPICAL_CREAM | VAGINAL | 3 refills | Status: DC

## 2016-11-08 MED ORDER — CLOTRIMAZOLE-BETAMETHASONE 1-0.05 % EX CREA: 1.0000 "application " | TOPICAL_CREAM | Freq: Two times a day (BID) | CUTANEOUS | 0 refills | Status: DC

## 2016-11-08 NOTE — Patient Instructions (Signed)
Conjugated Estrogens vaginal cream What is this medicine? CONJUGATED ESTROGENS (CON ju gate ed ESS troe jenz) are a mixture of female hormones. This cream can help relieve symptoms associated with menopause.like vaginal dryness and irritation. This medicine may be used for other purposes; ask your health care provider or pharmacist if you have questions. COMMON BRAND NAME(S): Premarin What should I tell my health care provider before I take this medicine? They need to know if you have any of these conditions: -abnormal vaginal bleeding -blood vessel disease or blood clots -breast, cervical, endometrial, or uterine cancer -dementia -diabetes -gallbladder disease -heart disease or recent heart attack -high blood pressure -high cholesterol -high level of calcium in the blood -hysterectomy -kidney disease -liver disease -migraine headaches -protein C deficiency -protein S deficiency -stroke -systemic lupus erythematosus (SLE) -tobacco smoker -an unusual or allergic reaction to estrogens other medicines, foods, dyes, or preservatives -pregnant or trying to get pregnant -breast-feeding How should I use this medicine? This medicine is for use in the vagina only. Do not take by mouth. Follow the directions on the prescription label. Use at bedtime unless otherwise directed by your doctor or health care professional. Use the special applicator supplied with the cream. Wash hands before and after use. Fill the applicator with the cream and remove from the tube. Lie on your back, part and bend your knees. Insert the applicator into the vagina and push the plunger to expel the cream into the vagina. Wash the applicator with warm soapy water and rinse well. Use exactly as directed for the complete length of time prescribed. Do not stop using except on the advice of your doctor or health care professional. Talk to your pediatrician regarding the use of this medicine in children. Special care may be  needed. A patient package insert for the product will be given with each prescription and refill. Read this sheet carefully each time. The sheet may change frequently. Overdosage: If you think you have taken too much of this medicine contact a poison control center or emergency room at once. NOTE: This medicine is only for you. Do not share this medicine with others. What if I miss a dose? If you miss a dose, use it as soon as you can. If it is almost time for your next dose, use only that dose. Do not use double or extra doses. What may interact with this medicine? Do not take this medicine with any of the following medications: -aromatase inhibitors like aminoglutethimide, anastrozole, exemestane, letrozole, testolactone This medicine may also interact with the following medications: -barbiturates used for inducing sleep or treating seizures -carbamazepine -grapefruit juice -medicines for fungal infections like itraconazole and ketoconazole -raloxifene or tamoxifen -rifabutin -rifampin -rifapentine -ritonavir -some antibiotics used to treat infections -St. John's Wort -warfarin This list may not describe all possible interactions. Give your health care provider a list of all the medicines, herbs, non-prescription drugs, or dietary supplements you use. Also tell them if you smoke, drink alcohol, or use illegal drugs. Some items may interact with your medicine. What should I watch for while using this medicine? Visit your health care professional for regular checks on your progress. You will need a regular breast and pelvic exam. You should also discuss the need for regular mammograms with your health care professional, and follow his or her guidelines. This medicine can make your body retain fluid, making your fingers, hands, or ankles swell. Your blood pressure can go up. Contact your doctor or health care professional if you  feel you are retaining fluid. If you have any reason to think  you are pregnant; stop taking this medicine at once and contact your doctor or health care professional. Tobacco smoking increases the risk of getting a blood clot or having a stroke, especially if you are more than 67 years old. You are strongly advised not to smoke. If you wear contact lenses and notice visual changes, or if the lenses begin to feel uncomfortable, consult your eye care specialist. If you are going to have elective surgery, you may need to stop taking this medicine beforehand. Consult your health care professional for advice prior to scheduling the surgery. What side effects may I notice from receiving this medicine? Side effects that you should report to your doctor or health care professional as soon as possible: -allergic reactions like skin rash, itching or hives, swelling of the face, lips, or tongue -breast tissue changes or discharge -changes in vision -chest pain -confusion, trouble speaking or understanding -dark urine -general ill feeling or flu-like symptoms -light-colored stools -nausea, vomiting -pain, swelling, warmth in the leg -right upper belly pain -severe headaches -shortness of breath -sudden numbness or weakness of the face, arm or leg -trouble walking, dizziness, loss of balance or coordination -unusual vaginal bleeding -yellowing of the eyes or skin Side effects that usually do not require medical attention (report to your doctor or health care professional if they continue or are bothersome): -hair loss -increased hunger or thirst -increased urination -symptoms of vaginal infection like itching, irritation or unusual discharge -unusually weak or tired This list may not describe all possible side effects. Call your doctor for medical advice about side effects. You may report side effects to FDA at 1-800-FDA-1088. Where should I keep my medicine? Keep out of the reach of children. Store at room temperature between 15 and 30 degrees C (59 and 86  degrees F). Throw away any unused medicine after the expiration date. NOTE: This sheet is a summary. It may not cover all possible information. If you have questions about this medicine, talk to your doctor, pharmacist, or health care provider.  2018 Elsevier/Gold Standard (2010-04-26 09:20:36)  

## 2016-11-08 NOTE — Progress Notes (Signed)
Cystocele/Rectocele Patient complains of a cystocele. Problem started several years ago. Symptoms include: urinary incontinence: moderate and vulvar irritation from the [place].  Pt has had Interstim in past, since removed.  Also meds.  Has urgency and nocturia, and incontinence with activity.  Has been suggested on exam that she has cystocele.. Symptoms have gradually worsened.  Not sexually active.  Incontinence- No assoc sx's or other modifiers.  Duration years.  Moderate severity.  Context- age, prolapse of tissues, bladder spasticity.     PMHx: She  has a past medical history of Anxiety; Arthritis; Chronic back pain; Depression; GERD (gastroesophageal reflux disease); Hypertension; Hypothyroidism; Pneumonia; PONV (postoperative nausea and vomiting); Psoriasis; and Thyroid disease. Also,  has a past surgical history that includes bladder stimulator; Hand surgery (Right); Oophorectomy (Right); Abdominal hysterectomy; Cataract extraction w/PHACO (Right, 03/07/2015); Cataract extraction w/PHACO (Left, 03/21/2015); Carpal tunnel release; and Interstim Implant removal (N/A, 09/07/2015)., family history includes Breast cancer in her cousin, cousin, and mother; COPD in her father; Heart disease in her mother; Kidney disease in her sister.,  reports that she has been smoking Cigarettes.  She has been smoking about 1.00 pack per day. She has never used smokeless tobacco. She reports that she does not drink alcohol or use drugs.  She has a current medication list which includes the following prescription(s): acetaminophen, albuterol, alprazolam, alprazolam, betamethasone dipropionate, citalopram, diltiazem, fluticasone furoate-vilanterol, hydroxyzine, levalbuterol, levothyroxine, lisinopril, mirabegron er, omeprazole, pravastatin, secukinumab, and triamcinolone. Also, is allergic to ciprofloxacin; oxycodone; oxycodone-acetaminophen; oxycodone-acetaminophen; and sulfa antibiotics.  Review of Systems  Constitutional:  Negative for chills, fever and malaise/fatigue.  HENT: Negative for congestion, sinus pain and sore throat.   Eyes: Negative for blurred vision and pain.  Respiratory: Negative for cough and wheezing.   Cardiovascular: Negative for chest pain and leg swelling.  Gastrointestinal: Negative for abdominal pain, constipation, diarrhea, heartburn, nausea and vomiting.  Genitourinary: Negative for dysuria, frequency, hematuria and urgency.  Musculoskeletal: Negative for back pain, joint pain, myalgias and neck pain.  Skin: Negative for itching and rash.  Neurological: Negative for dizziness, tremors and weakness.  Endo/Heme/Allergies: Does not bruise/bleed easily.  Psychiatric/Behavioral: Negative for depression. The patient is not nervous/anxious and does not have insomnia.     Objective: BP 120/80   Pulse 89   Ht 5\' 3"  (1.6 m)   Wt 180 lb (81.6 kg)   BMI 31.89 kg/m  Physical Exam  Constitutional: She is oriented to person, place, and time. She appears well-developed and well-nourished. No distress.  Genitourinary: Vagina normal. Pelvic exam was performed with patient supine. There is no rash, tenderness or lesion on the right labia. There is no rash, tenderness or lesion on the left labia. No erythema or bleeding in the vagina.  Genitourinary Comments: Vaginal atrophy Vulvar skin erythema and thickening Gr 1 Cystocele No Rectocele Cuff intact/ no lesions Absent uterus and cervix  Abdominal: Soft. She exhibits no distension. There is no tenderness.  Musculoskeletal: Normal range of motion.  Neurological: She is alert and oriented to person, place, and time. No cranial nerve deficit.  Skin: Skin is warm and dry.  Psychiatric: She has a normal mood and affect.   ASSESSMENT/PLAN:   Problem List Items Addressed This Visit      Genitourinary   Cystocele, midline - Primary   Vaginal atrophy    Pessary vs surgery vs no tx options discussed.  PROCEDURE Pessary fitting today.  Risks  and side effects discussed.  Fitting tray with rubber pessaries displayed to patient.  Size  2 ring fitted with mild discomfort.  Expulsion.  Bleeding.  PLAN- Lotrisone external use as may be yeast component    Has used Clobetasol in past and may need to cont this for Lichen Pessary advised, not likely amenable to surgery (not severe prolapse).   May benefit from tx of atrophy.  Will treat for atrophy for 3-4 weeks then return for exam and pessary trial (size 1 ring, smallest we have).  Barnett Applebaum, MD, [facility], [facility] 11/08/2016  3:37 PM

## 2016-11-09 ENCOUNTER — Ambulatory Visit: Payer: Self-pay | Admitting: Family Medicine

## 2016-11-15 ENCOUNTER — Other Ambulatory Visit: Payer: Self-pay | Admitting: Family Medicine

## 2016-11-15 DIAGNOSIS — F3341 Major depressive disorder, recurrent, in partial remission: Secondary | ICD-10-CM

## 2016-11-19 ENCOUNTER — Ambulatory Visit: Payer: Self-pay | Admitting: Family Medicine

## 2016-11-20 ENCOUNTER — Ambulatory Visit (INDEPENDENT_AMBULATORY_CARE_PROVIDER_SITE_OTHER): Payer: PPO | Admitting: Family Medicine

## 2016-11-20 ENCOUNTER — Encounter: Payer: Self-pay | Admitting: Family Medicine

## 2016-11-20 VITALS — BP 110/60 | HR 110 | Temp 98.1°F | Resp 16 | Ht 63.0 in | Wt 176.8 lb

## 2016-11-20 DIAGNOSIS — I10 Essential (primary) hypertension: Secondary | ICD-10-CM | POA: Diagnosis not present

## 2016-11-20 LAB — BASIC METABOLIC PANEL WITH GFR
BUN: 10 mg/dL (ref 7–25)
CO2: 30 mmol/L (ref 20–32)
CREATININE: 0.83 mg/dL (ref 0.50–0.99)
Calcium: 9.6 mg/dL (ref 8.6–10.4)
Chloride: 101 mmol/L (ref 98–110)
GFR, EST NON AFRICAN AMERICAN: 73 mL/min/{1.73_m2} (ref >=60)
GFR, Est African American: 85 mL/min/{1.73_m2} (ref >=60)
GLUCOSE: 101 mg/dL (ref 65–139)
Potassium: 4.2 mmol/L (ref 3.5–5.3)
Sodium: 139 mmol/L (ref 135–146)

## 2016-11-20 MED ORDER — LISINOPRIL 10 MG PO TABS: 5.0000 mg | ORAL_TABLET | Freq: Every day | ORAL | 0 refills | Status: DC

## 2016-11-20 NOTE — Patient Instructions (Addendum)
-   Please measure your blood pressure each evening, write it down, and bring this to your next appointment.  - Please take 5mg  of Lisinopril - Break your 10mg  tablets in HALF and take one half each morning.

## 2016-11-20 NOTE — Progress Notes (Signed)
Name: [name]   MRN: [id number]    DOB: [birth year]   Date:11/20/2016       Progress Note  Subjective  Chief Complaint  Chief Complaint  Patient presents with  . Hypertension    BP was elevated on Sunday 186/105 then dropped to 120/64. Today 137/86    HPI  Pt presents with concern for fluctuating BP - she notes that her BP was read on an automatic cuff 3 days ago and it read 186/105, then it decreased to 120/64 2 hours later (she had taken 10mg  Lisinopril ONLY at this time).  She reports feeling lightheaded, nausea, headache, and near-syncope when her BP drops low.  Saw PCP Dr. Manuella Ghazi on 11/06/2016 for the same, and he increased her lisinopril from 5mg  to 10mg  QAM; also taking Cardizem 120mg  each afternoon.  Denies chest pain or abnormal shortness of breath (has COPD), no BLE edema.  Patient Active Problem List   Diagnosis Date Noted  . Cystocele, midline 11/08/2016  . Vaginal atrophy 11/08/2016  . Chronic left hip pain 05/07/2016  . Insomnia 01/10/2016  . COPD exacerbation ([place]) 12/17/2015  . Abdominal pain, acute, epigastric 12/13/2015  . Bilateral carotid artery stenosis 08/22/2015  . Atherosclerotic peripheral vascular disease with intermittent claudication ([place]) 08/04/2015  . Benign essential HTN 08/04/2015  . Mixed hyperlipidemia 08/04/2015  . Shortness of breath 08/04/2015  . Abnormal finding on EKG 07/25/2015  . Abnormal ECG 07/01/2015  . Narrowing of intervertebral disc space 07/01/2015  . Anxiety and depression 07/01/2015  . Dilatation pupillary 07/01/2015  . Gravida 2 para 2 07/01/2015  . Panic disorder without agoraphobia with moderate panic attacks 07/01/2015  . Parity 2 07/01/2015  . Muscle cramps at night 06/24/2015  . Prediabetes 06/09/2015  . Bladder disorder 06/03/2015  . Lichen sclerosus 90/30/0923  . Chronic vulvitis 02/20/2015  . Annual physical exam 02/01/2015  . Abdominal pain, LLQ 02/01/2015  . Productive cough 12/23/2014  . Acute maxillary  sinusitis 12/23/2014  . Hypertension 12/09/2014  . Chronic LBP 09/16/2014  . Clinical depression 08/19/2014  . Adult hypothyroidism 08/19/2014  . Anxiety disorder 06/23/2014  . Acquired hypothyroidism 01/13/2014  . Continuous opioid dependence ([place]) 07/28/2013  . Chronic pain associated with significant psychosocial dysfunction 08/05/2012  . Other long term (current) drug therapy 08/05/2012  . Pain medication agreement signed 08/05/2012  . Infection of urinary tract 05/06/2012  . Acid reflux 07/01/2009  . Bladder retention 10/25/2008  . Lumbar canal stenosis 10/21/2008  . Current tobacco use 09/19/2008  . Cephalalgia 09/02/2008  . Degeneration of intervertebral disc of lumbosacral region 03/22/2008    Social History  Substance Use Topics  . Smoking status: Current Every Day Smoker    Packs/day: 1.00    Types: Cigarettes  . Smokeless tobacco: Never Used  . Alcohol use No     Current Outpatient Prescriptions:  .  acetaminophen (TYLENOL) 500 MG tablet, Take 500 mg by mouth every 6 (six) hours as needed for moderate pain. Reported on 07/01/2015, Disp: , Rfl:  .  albuterol (PROVENTIL HFA;VENTOLIN HFA) 108 (90 Base) MCG/ACT inhaler, Inhale 2 puffs into the lungs every 6 (six) hours as needed for wheezing or shortness of breath., Disp: 3 Inhaler, Rfl: 2 .  ALPRAZolam (XANAX) 0.5 MG tablet, Take 1 tablet (0.5 mg total) by mouth 3 (three) times daily as needed for anxiety., Disp: 90 tablet, Rfl: 0 .  ALPRAZolam (XANAX) 1 MG tablet, Take 1 tablet (1 mg total) by mouth at bedtime as needed for  anxiety., Disp: 30 tablet, Rfl: 0 .  betamethasone dipropionate (DIPROLENE) 0.05 % ointment, , Disp: , Rfl:  .  citalopram (CELEXA) 20 MG tablet, TAKE ONE TABLET BY MOUTH EVERY DAY, Disp: 90 tablet, Rfl: 0 .  clotrimazole-betamethasone (LOTRISONE) cream, Apply 1 application topically 2 (two) times daily., Disp: 30 g, Rfl: 0 .  conjugated estrogens (PREMARIN) vaginal cream, Place 1 Applicatorful  vaginally 2 (two) times a week. 1 gram vaginally at bedtime daily for 2 weeks then twice weekly, Disp: 30 g, Rfl: 3 .  diltiazem (CARDIZEM CD) 120 MG 24 hr capsule, Take 1 capsule (120 mg total) by mouth daily., Disp: 90 capsule, Rfl: 0 .  fluticasone furoate-vilanterol (BREO ELLIPTA) 100-25 MCG/INH AEPB, INHALE 1 PUFF BY MOUTH INTO THE LUNGS ONCE DAILY, Disp: 60 each, Rfl: 0 .  hydrOXYzine (ATARAX/VISTARIL) 25 MG tablet, , Disp: , Rfl:  .  levalbuterol (XOPENEX) 1.25 MG/0.5ML nebulizer solution, Take 1.25 mg by nebulization every 6 (six) hours., Disp: 1 each, Rfl: 12 .  levothyroxine (SYNTHROID) 100 MCG tablet, Take 1 tablet (100 mcg total) by mouth daily with breakfast., Disp: 90 tablet, Rfl: 1 .  lisinopril (PRINIVIL,ZESTRIL) 10 MG tablet, Take 1 tablet (10 mg total) by mouth daily., Disp: 90 tablet, Rfl: 0 .  mirabegron ER (MYRBETRIQ) 25 MG TB24 tablet, Take 1 tablet (25 mg total) by mouth daily., Disp: 90 tablet, Rfl: 3 .  omeprazole (PRILOSEC) 20 MG capsule, Take 1 capsule (20 mg total) by mouth daily., Disp: 30 capsule, Rfl: 0 .  Secukinumab (COSENTYX [place]), Inject into the skin every 30 (thirty) days. 2 injections per month, Disp: , Rfl:  .  triamcinolone (KENALOG) 0.025 % ointment, , Disp: , Rfl:  .  pravastatin (PRAVACHOL) 20 MG tablet, Take by mouth., Disp: , Rfl:   Allergies  Allergen Reactions  . Ciprofloxacin Hives and Diarrhea  . Oxycodone Diarrhea  . Oxycodone-Acetaminophen Diarrhea  . Oxycodone-Acetaminophen Diarrhea  . Sulfa Antibiotics Rash    Throat, mouth Throat, mouth    ROS  Ten systems reviewed and is negative except as mentioned in HPI  Objective  Vitals:   11/20/16 1200  BP: 110/60  Pulse: (!) 110  Resp: 16  Temp: 98.1 F (36.7 C)  TempSrc: Oral  SpO2: 92%  Weight: 176 lb 12.8 oz (80.2 kg)  Height: 5\' 3"  (1.6 m)   Body mass index is 31.32 kg/m.  Nursing Note and Vital Signs reviewed. HR mildly elevated - has not yet taken diltiazem  today.  Physical Exam Constitutional: Patient appears well-developed and well-nourished. Obese No distress.  HEENT: head atraumatic, normocephalic, pupils equal and reactive to light Cardiovascular: Normal rate, regular rhythm, S1/S2 present.  No murmur or rub heard. No BLE edema. Pulmonary/Chest: Effort normal and breath sounds clear. No respiratory distress or retractions. Psychiatric: Patient has a normal mood and affect. behavior is normal. Judgment and thought content normal.  Recent Results (from the past 2160 hour(s))  BLADDER SCAN AMB NON-IMAGING     Status: None   Collection Time: 08/29/16  9:57 AM  Result Value Ref Range   Scan Result 58      Assessment & Plan  1. Benign essential HTN - BASIC METABOLIC PANEL WITH GFR - lisinopril (PRINIVIL,ZESTRIL) 10 MG tablet; Take 0.5 tablets (5 mg total) by mouth daily.  Dispense: 90 tablet; Refill: 0 -- No additional refill provided today, dosing change documentation only.  -Red flags and when to present for emergency care or RTC including fever >101.54F, chest pain, shortness  of breath, new/worsening/un-resolving symptoms, near-syncope or syncope, reviewed with patient at time of visit. Follow up and care instructions discussed and provided in AVS.  - Plan of care discussed with Dr. Manuella Ghazi and he is in agreement with the plan.

## 2016-11-23 ENCOUNTER — Ambulatory Visit: Payer: PPO

## 2016-11-23 VITALS — BP 138/68 | HR 111

## 2016-11-23 DIAGNOSIS — I10 Essential (primary) hypertension: Secondary | ICD-10-CM

## 2016-11-23 NOTE — Progress Notes (Signed)
Patient states at home her BP has been running 176/99 to 137/87. But has been checking with her wrist cuff. Raquel Sarna states her BP is doing great, since it was running low and to stay in on the 5 mg Lisinopril and follow up with Dr. Manuella Ghazi on her headaches.

## 2016-11-27 NOTE — Progress Notes (Deleted)
11/29/2016 10:41 AM   [name] 05-Jan-1951 [id number]  Referring provider: Roselee Nova, MD [address] [place], [place] [address]  No chief complaint on file.   HPI: Patient is a 66 year old Caucasian female with frequency, vaginal atrophy and cystocele who presents today to for a 3 month follow up.      Frequency Today, the patient has been experiencing urgency x 0-3 (improved), frequency x 4-7 (stable), is restricting fluids to avoid visits to the restroom, is engaging in toilet mapping, incontinence x 0-3 (stable) and nocturia x 0-3 (stable).   Her PVR today is negative.     Vaginal atrophy She was recently evaluated by Dr. Kenton Kingfisher and found to have severe atrophy of vagina as well as vulvar irritation/ lichen sclerosus.    Cystocele Patient could not tolerate the pessary due to her severe vaginal atrophy and lichen sclerosus.  She is currently on vaginal estrogen cream.  She has a follow up appointment with Dr. Kenton Kingfisher in 12/2016.      MH: Past Medical History:  Diagnosis Date  . Anxiety   . Arthritis   . Chronic back pain   . Depression   . GERD (gastroesophageal reflux disease)   . Hypertension   . Hypothyroidism   . Pneumonia    RECENT X 3  . PONV (postoperative nausea and vomiting)   . Psoriasis   . Thyroid disease     Surgical History: Past Surgical History:  Procedure Laterality Date  . ABDOMINAL HYSTERECTOMY     heavy bleeding  . bladder stimulator    . CARPAL TUNNEL RELEASE    . CATARACT EXTRACTION W/PHACO Right 03/07/2015   Procedure: CATARACT EXTRACTION PHACO AND INTRAOCULAR LENS PLACEMENT (IOC);  Surgeon: Estill Cotta, MD;  Location: [facility];  Service: Ophthalmology;  Laterality: Right;  [name] 01:14 AP% 26.4 CDE 33.04 fluid pack lot # [id number]  . CATARACT EXTRACTION W/PHACO Left 03/21/2015   Procedure: CATARACT EXTRACTION PHACO AND INTRAOCULAR LENS PLACEMENT (IOC);  Surgeon: Estill Cotta, MD;  Location: [facility];   Service: Ophthalmology;  Laterality: Left;  [name] 01:21 AP% 25.6 CDE 39.78 fluid pack lot # [id number]  . HAND SURGERY Right   . INTERSTIM IMPLANT REMOVAL N/A 09/07/2015   Procedure: REMOVAL OF INTERSTIM IMPLANT;  Surgeon: Bjorn Loser, MD;  Location: [facility];  Service: Urology;  Laterality: N/A;  . OOPHORECTOMY Right    ovarian cyst    Home Medications:  Allergies as of 11/29/2016      Reactions   Ciprofloxacin Hives, Diarrhea   Oxycodone Diarrhea   Oxycodone-acetaminophen Diarrhea   Oxycodone-acetaminophen Diarrhea   Sulfa Antibiotics Rash   Throat, mouth Throat, mouth      Medication List       Accurate as of 11/27/16 10:41 AM. Always use your most recent med list.          acetaminophen 500 MG tablet Commonly known as:  TYLENOL Take 500 mg by mouth every 6 (six) hours as needed for moderate pain. Reported on 07/01/2015   albuterol 108 (90 Base) MCG/ACT inhaler Commonly known as:  PROVENTIL HFA;VENTOLIN HFA Inhale 2 puffs into the lungs every 6 (six) hours as needed for wheezing or shortness of breath.   ALPRAZolam 1 MG tablet Commonly known as:  XANAX Take 1 tablet (1 mg total) by mouth at bedtime as needed for anxiety.   ALPRAZolam 0.5 MG tablet Commonly known as:  XANAX Take 1 tablet (0.5 mg total) by mouth 3 (three)  times daily as needed for anxiety.   betamethasone dipropionate 0.05 % ointment Commonly known as:  DIPROLENE   citalopram 20 MG tablet Commonly known as:  CELEXA TAKE ONE TABLET BY MOUTH EVERY DAY   clotrimazole-betamethasone cream Commonly known as:  LOTRISONE Apply 1 application topically 2 (two) times daily.   conjugated estrogens vaginal cream Commonly known as:  PREMARIN Place 1 Applicatorful vaginally 2 (two) times a week. 1 gram vaginally at bedtime daily for 2 weeks then twice weekly   COSENTYX [place] Inject into the skin every 30 (thirty) days. 2 injections per month   diltiazem 120 MG 24 hr capsule Commonly known as:  CARDIZEM  CD Take 1 capsule (120 mg total) by mouth daily.   fluticasone furoate-vilanterol 100-25 MCG/INH Aepb Commonly known as:  BREO ELLIPTA INHALE 1 PUFF BY MOUTH INTO THE LUNGS ONCE DAILY   hydrOXYzine 25 MG tablet Commonly known as:  ATARAX/VISTARIL   levalbuterol 1.25 MG/0.5ML nebulizer solution Commonly known as:  XOPENEX Take 1.25 mg by nebulization every 6 (six) hours.   levothyroxine 100 MCG tablet Commonly known as:  SYNTHROID Take 1 tablet (100 mcg total) by mouth daily with breakfast.   lisinopril 10 MG tablet Commonly known as:  PRINIVIL,ZESTRIL Take 0.5 tablets (5 mg total) by mouth daily.   mirabegron ER 25 MG Tb24 tablet Commonly known as:  MYRBETRIQ Take 1 tablet (25 mg total) by mouth daily.   omeprazole 20 MG capsule Commonly known as:  PRILOSEC Take 1 capsule (20 mg total) by mouth daily.   pravastatin 20 MG tablet Commonly known as:  PRAVACHOL Take by mouth.   triamcinolone 0.025 % ointment Commonly known as:  KENALOG       Allergies:  Allergies  Allergen Reactions  . Ciprofloxacin Hives and Diarrhea  . Oxycodone Diarrhea  . Oxycodone-Acetaminophen Diarrhea  . Oxycodone-Acetaminophen Diarrhea  . Sulfa Antibiotics Rash    Throat, mouth Throat, mouth    Family History: Family History  Problem Relation Age of Onset  . Heart disease Mother   . Breast cancer Mother   . COPD Father   . Kidney disease Sister   . Breast cancer Cousin   . Breast cancer Cousin   . Kidney cancer Neg Hx   . Bladder Cancer Neg Hx     Social History:  reports that she has been smoking Cigarettes.  She has been smoking about 1.00 pack per day. She has never used smokeless tobacco. She reports that she does not drink alcohol or use drugs.  ROS:                                        Physical Exam: There were no vitals taken for this visit.  Constitutional: Well nourished. Alert and oriented, No acute distress. HEENT: [place] AT, moist mucus  membranes. Trachea midline, no masses. Cardiovascular: No clubbing, cyanosis, or edema. Respiratory: Normal respiratory effort, no increased work of breathing. GI: Abdomen is soft, non tender, non distended, no abdominal masses. Liver and spleen not palpable.  No hernias appreciated.  Stool sample for occult testing is not indicated.   GU: No CVA tenderness.  No bladder fullness or masses.  Atrophic external genitalia, normal pubic hair distribution, no lesions.  Normal urethral meatus, no lesions, no prolapse, no discharge.   No urethral masses, tenderness and/or tenderness. No bladder fullness, tenderness or masses. Pale vagina mucosa, poor estrogen  effect, no discharge, no lesions, good pelvic support, no cystocele or rectocele noted.  Cervix, uterus and adnexa are surgically absent.  Anus and perineum are without rashes or lesions.    Skin: No rashes, bruises or suspicious lesions. Lymph: No cervical or inguinal adenopathy. Neurologic: Grossly intact, no focal deficits, moving all 4 extremities. Psychiatric: Normal mood and affect.  Laboratory Data: Lab Results  Component Value Date   WBC 17.3 (H) 12/20/2015   HGB 13.5 12/20/2015   HCT 41.0 12/20/2015   MCV 92.5 12/20/2015   PLT 218 12/20/2015    Lab Results  Component Value Date   CREATININE 0.83 11/20/2016     Lab Results  Component Value Date   HGBA1C 6.1 07/09/2016   I have reviewed the labs  Pertinent imaging ***  Assessment & Plan:    1. Frequency   - only on Myrbetriq - finds it efffective  - RTC in 3 months for OAB questionnaire and PVR    2. Vaginal atrophy/discharge  - start Premarin vaginal cream 3 nights weekly  - discharge is physiological   - RTC in 3 months for exam  3.  Cystocele  - referred to gynecology for possible pessary  No Follow-up on file.  Zara Council, [facility] [address], [place], [place] [address] [phone]

## 2016-11-28 ENCOUNTER — Encounter: Payer: Self-pay | Admitting: Family Medicine

## 2016-11-29 ENCOUNTER — Ambulatory Visit: Payer: Self-pay | Admitting: Urology

## 2016-12-06 ENCOUNTER — Ambulatory Visit (INDEPENDENT_AMBULATORY_CARE_PROVIDER_SITE_OTHER): Payer: PPO | Admitting: Family Medicine

## 2016-12-06 ENCOUNTER — Encounter: Payer: Self-pay | Admitting: Family Medicine

## 2016-12-06 DIAGNOSIS — F5105 Insomnia due to other mental disorder: Secondary | ICD-10-CM | POA: Diagnosis not present

## 2016-12-06 DIAGNOSIS — F410 Panic disorder [episodic paroxysmal anxiety] without agoraphobia: Secondary | ICD-10-CM | POA: Diagnosis not present

## 2016-12-06 DIAGNOSIS — F99 Mental disorder, not otherwise specified: Secondary | ICD-10-CM

## 2016-12-06 MED ORDER — ALPRAZOLAM 0.5 MG PO TABS: 0.5000 mg | ORAL_TABLET | Freq: Three times a day (TID) | ORAL | 0 refills | Status: DC | PRN

## 2016-12-06 MED ORDER — ALPRAZOLAM 1 MG PO TABS: 1.0000 mg | ORAL_TABLET | Freq: Every evening | ORAL | 0 refills | Status: DC | PRN

## 2016-12-06 NOTE — Progress Notes (Signed)
The following letter was provided to [name] during their visit today: ---------------------------------------  Dear valued [name],  I am writing to share that as of March 22, 2017, I will no longer be seeing patients at [facility]. While it has been my privilege to care for you as a physician, I have decided to move outside of [place] to pursue other opportunities.  The staff at [facility] has been supportive of my decision and are supportive of any patients who have been under my care.  They will be happy to provide care to you and your family.  The office staff will do everything they can to ensure a seamless transition of care at [facility].  However if you are on any controlled substance medications (i.e. Pain medication or benzodiazepines), they will no longer be able to refill those medications, but we will be more than happy to refer you to a specialist.  [name] [facility] will also assist you with the transfer of medical records should you wish to seek care elsewhere.  If you have any questions about your future care, you may call the office at [phone].  I have enjoyed getting to know my patients here and I wish you the very best.  Sincerely,  Rochel Brome, MD  ---------------------------------------  A written copy of this letter was given to the patient.  The patient verbalizes understanding of the letter, and does request referral to specialist or to new primary care provider.  This decision has been conveyed to Dr. Manuella Ghazi, who will place any appropriate referrals during today's visit.

## 2016-12-06 NOTE — Progress Notes (Signed)
Name: [name]   MRN: [id number]    DOB: 05-18-1950   Date:12/06/2016       Progress Note  Subjective  Chief Complaint  Chief Complaint  Patient presents with  . Medication Refill    Insomnia  Primary symptoms: frequent awakening.  The onset quality is gradual. The symptoms are aggravated by anxiety. Past treatments include medication. Typical bedtime:  11-12 P.M..  How long after going to bed to you fall asleep: 15-30 minutes.   PMH includes: hypertension, depression, family stress or anxiety, chronic pain.  Anxiety  Presents for follow-up visit. Symptoms include depressed mood, excessive worry, insomnia, nervous/anxious behavior and panic. The severity of symptoms is moderate and causing significant distress.      Past Medical History:  Diagnosis Date  . Anxiety   . Arthritis   . Chronic back pain   . Depression   . GERD (gastroesophageal reflux disease)   . Hypertension   . Hypothyroidism   . Pneumonia    RECENT X 3  . PONV (postoperative nausea and vomiting)   . Psoriasis   . Thyroid disease     Past Surgical History:  Procedure Laterality Date  . ABDOMINAL HYSTERECTOMY     heavy bleeding  . bladder stimulator    . CARPAL TUNNEL RELEASE    . CATARACT EXTRACTION W/PHACO Right 03/07/2015   Procedure: CATARACT EXTRACTION PHACO AND INTRAOCULAR LENS PLACEMENT (IOC);  Surgeon: Estill Cotta, MD;  Location: [facility];  Service: Ophthalmology;  Laterality: Right;  [name] 01:14 AP% 26.4 CDE 33.04 fluid pack lot # [id number]  . CATARACT EXTRACTION W/PHACO Left 03/21/2015   Procedure: CATARACT EXTRACTION PHACO AND INTRAOCULAR LENS PLACEMENT (IOC);  Surgeon: Estill Cotta, MD;  Location: [facility];  Service: Ophthalmology;  Laterality: Left;  [name] 01:21 AP% 25.6 CDE 39.78 fluid pack lot # [id number]  . HAND SURGERY Right   . INTERSTIM IMPLANT REMOVAL N/A 09/07/2015   Procedure: REMOVAL OF INTERSTIM IMPLANT;  Surgeon: Bjorn Loser, MD;  Location: [facility] OR[facility];  Service:  Urology;  Laterality: N/A;  . OOPHORECTOMY Right    ovarian cyst    Family History  Problem Relation Age of Onset  . Heart disease Mother   . Breast cancer Mother   . COPD Father   . Kidney disease Sister   . Breast cancer Cousin   . Breast cancer Cousin   . Kidney cancer Neg Hx   . Bladder Cancer Neg Hx     Social History   Social History  . Marital status: Widowed    Spouse name: N/A  . Number of children: N/A  . Years of education: N/A   Occupational History  . Not on file.   Social History Main Topics  . Smoking status: Current Every Day Smoker    Packs/day: 1.00    Types: Cigarettes  . Smokeless tobacco: Never Used  . Alcohol use No  . Drug use: No  . Sexual activity: No   Other Topics Concern  . Not on file   Social History Narrative  . No narrative on file     Current Outpatient Prescriptions:  .  acetaminophen (TYLENOL) 500 MG tablet, Take 500 mg by mouth every 6 (six) hours as needed for moderate pain. Reported on 07/01/2015, Disp: , Rfl:  .  albuterol (PROVENTIL HFA;VENTOLIN HFA) 108 (90 Base) MCG/ACT inhaler, Inhale 2 puffs into the lungs every 6 (six) hours as needed for wheezing or shortness of breath., Disp: 3 Inhaler, Rfl: 2 .  ALPRAZolam (XANAX) 0.5 MG tablet, Take 1 tablet (0.5 mg total) by mouth 3 (three) times daily as needed for anxiety., Disp: 90 tablet, Rfl: 0 .  ALPRAZolam (XANAX) 1 MG tablet, Take 1 tablet (1 mg total) by mouth at bedtime as needed for anxiety., Disp: 30 tablet, Rfl: 0 .  betamethasone dipropionate (DIPROLENE) 0.05 % ointment, , Disp: , Rfl:  .  citalopram (CELEXA) 20 MG tablet, TAKE ONE TABLET BY MOUTH EVERY DAY, Disp: 90 tablet, Rfl: 0 .  clotrimazole-betamethasone (LOTRISONE) cream, Apply 1 application topically 2 (two) times daily., Disp: 30 g, Rfl: 0 .  conjugated estrogens (PREMARIN) vaginal cream, Place 1 Applicatorful vaginally 2 (two) times a week. 1 gram vaginally at bedtime daily for 2 weeks then twice weekly,  Disp: 30 g, Rfl: 3 .  diltiazem (CARDIZEM CD) 120 MG 24 hr capsule, Take 1 capsule (120 mg total) by mouth daily., Disp: 90 capsule, Rfl: 0 .  fluticasone furoate-vilanterol (BREO ELLIPTA) 100-25 MCG/INH AEPB, INHALE 1 PUFF BY MOUTH INTO THE LUNGS ONCE DAILY, Disp: 60 each, Rfl: 0 .  hydrOXYzine (ATARAX/VISTARIL) 25 MG tablet, , Disp: , Rfl:  .  levalbuterol (XOPENEX) 1.25 MG/0.5ML nebulizer solution, Take 1.25 mg by nebulization every 6 (six) hours., Disp: 1 each, Rfl: 12 .  levothyroxine (SYNTHROID) 100 MCG tablet, Take 1 tablet (100 mcg total) by mouth daily with breakfast., Disp: 90 tablet, Rfl: 1 .  lisinopril (PRINIVIL,ZESTRIL) 10 MG tablet, Take 0.5 tablets (5 mg total) by mouth daily., Disp: 90 tablet, Rfl: 0 .  omeprazole (PRILOSEC) 20 MG capsule, Take 1 capsule (20 mg total) by mouth daily., Disp: 30 capsule, Rfl: 0 .  Secukinumab (COSENTYX ), Inject into the skin every 30 (thirty) days. 2 injections per month, Disp: , Rfl:  .  triamcinolone (KENALOG) 0.025 % ointment, , Disp: , Rfl:  .  mirabegron ER (MYRBETRIQ) 25 MG TB24 tablet, Take 1 tablet (25 mg total) by mouth daily. (Patient not taking: Reported on 12/06/2016), Disp: 90 tablet, Rfl: 3 .  pravastatin (PRAVACHOL) 20 MG tablet, Take by mouth., Disp: , Rfl:   Allergies  Allergen Reactions  . Ciprofloxacin Hives and Diarrhea  . Oxycodone Diarrhea  . Oxycodone-Acetaminophen Diarrhea  . Oxycodone-Acetaminophen Diarrhea  . Sulfa Antibiotics Rash    Throat, mouth Throat, mouth     Review of Systems  Psychiatric/Behavioral: Positive for depression. The patient is nervous/anxious and has insomnia.       Objective  Vitals:   12/06/16 1201  BP: 130/70  Pulse: 98  Resp: 14  SpO2: 94%  Weight: 178 lb 1.6 oz (80.8 kg)  Height: 5\' 3"  (1.6 m)    Physical Exam  Constitutional: She is oriented to person, place, and time and well-developed, well-nourished, and in no distress.  HENT:  Head: Normocephalic and atraumatic.   Cardiovascular: Normal rate, regular rhythm and normal heart sounds.   No murmur heard. Pulmonary/Chest: Effort normal and breath sounds normal. She has no wheezes.  Neurological: She is alert and oriented to person, place, and time.  Psychiatric: Mood, memory, affect and judgment normal.  Nursing note and vitals reviewed.      Recent Results (from the past 2160 hour(s))  BASIC METABOLIC PANEL WITH GFR     Status: None   Collection Time: 11/20/16  1:02 PM  Result Value Ref Range   Glucose, Bld 101 65 - 139 mg/dL    Comment: .        Non-fasting reference interval .    BUN  10 7 - 25 mg/dL   Creat 0.83 0.50 - 0.99 mg/dL    Comment: For patients >25 years of age, the reference limit for Creatinine is approximately 13% higher for people identified as African-American. .    GFR, Est Non African American 73 > OR = 60 mL/min/1.24m2   GFR, Est African American 85 > OR = 60 mL/min/1.54m2   BUN/Creatinine Ratio NOT APPLICABLE 6 - 22 (calc)   Sodium 139 135 - 146 mmol/L   Potassium 4.2 3.5 - 5.3 mmol/L   Chloride 101 98 - 110 mmol/L   CO2 30 20 - 32 mmol/L   Calcium 9.6 8.6 - 10.4 mg/dL     Assessment & Plan  1. Insomnia due to other mental disorder Stable, responsive to alprazolam taken at bedtime as needed, refills provided. Explained that after my departure from practice, she will have to be referred to psychiatry for management of insomnia and panic disorder and she verbalized agreement - ALPRAZolam (XANAX) 1 MG tablet; Take 1 tablet (1 mg total) by mouth at bedtime as needed for anxiety.  Dispense: 30 tablet; Refill: 0  2. Panic disorder without agoraphobia Symptoms of anxiety and panic disorder stable and responsive to alprazolam taken up to 3 times daily as needed - ALPRAZolam (XANAX) 0.5 MG tablet; Take 1 tablet (0.5 mg total) by mouth 3 (three) times daily as needed for anxiety.  Dispense: 90 tablet; Refill: 0 - Ambulatory referral to Psychiatry   Temiloluwa [place] Asad A.  [facility] 12/06/2016 12:13 PM

## 2016-12-10 ENCOUNTER — Ambulatory Visit: Payer: PPO | Admitting: Obstetrics & Gynecology

## 2016-12-25 ENCOUNTER — Other Ambulatory Visit: Payer: Self-pay | Admitting: Family Medicine

## 2016-12-25 DIAGNOSIS — F99 Mental disorder, not otherwise specified: Principal | ICD-10-CM

## 2016-12-25 DIAGNOSIS — F5105 Insomnia due to other mental disorder: Secondary | ICD-10-CM

## 2016-12-25 DIAGNOSIS — F410 Panic disorder [episodic paroxysmal anxiety] without agoraphobia: Secondary | ICD-10-CM

## 2016-12-25 NOTE — Telephone Encounter (Signed)
Patient is scheduled for a follow up on 01/08/17, states she will run out of her xanax on 01/06/17, can enough be called in to last her till then? States she takes 0.5mg  3 times a day and 1mg  at night. Please advise

## 2016-12-25 NOTE — Telephone Encounter (Signed)
Copied from [facility] #[id number]. Topic: Quick Communication - See Telephone Encounter >> Dec 25, 2016 11:18 AM Synthia Innocent wrote: CRM for notification. See Telephone encounter for:  Med Refill 12/25/16.

## 2016-12-26 MED ORDER — ALPRAZOLAM 0.5 MG PO TABS: 0.5000 mg | ORAL_TABLET | Freq: Three times a day (TID) | ORAL | 0 refills | Status: DC | PRN

## 2016-12-26 MED ORDER — ALPRAZOLAM 1 MG PO TABS: 1.0000 mg | ORAL_TABLET | Freq: Every evening | ORAL | 0 refills | Status: DC | PRN

## 2017-01-01 ENCOUNTER — Ambulatory Visit: Payer: PPO | Admitting: Psychiatry

## 2017-01-03 ENCOUNTER — Ambulatory Visit: Payer: Self-pay | Admitting: Family Medicine

## 2017-01-08 ENCOUNTER — Ambulatory Visit: Payer: Self-pay | Admitting: Family Medicine

## 2017-01-16 ENCOUNTER — Ambulatory Visit: Payer: Self-pay | Admitting: Family Medicine

## 2017-01-31 ENCOUNTER — Ambulatory Visit: Payer: PPO | Admitting: Psychiatry

## 2017-02-04 ENCOUNTER — Ambulatory Visit: Payer: PPO | Admitting: Nurse Practitioner

## 2017-02-04 ENCOUNTER — Encounter: Payer: Self-pay | Admitting: Nurse Practitioner

## 2017-02-04 ENCOUNTER — Other Ambulatory Visit: Payer: Self-pay

## 2017-02-04 VITALS — BP 129/55 | HR 100 | Temp 98.6°F | Ht 63.0 in | Wt 175.8 lb

## 2017-02-04 DIAGNOSIS — F410 Panic disorder [episodic paroxysmal anxiety] without agoraphobia: Secondary | ICD-10-CM

## 2017-02-04 DIAGNOSIS — I10 Essential (primary) hypertension: Secondary | ICD-10-CM | POA: Diagnosis not present

## 2017-02-04 DIAGNOSIS — Z7689 Persons encountering health services in other specified circumstances: Secondary | ICD-10-CM | POA: Diagnosis not present

## 2017-02-04 DIAGNOSIS — F3341 Major depressive disorder, recurrent, in partial remission: Secondary | ICD-10-CM

## 2017-02-04 MED ORDER — CITALOPRAM HYDROBROMIDE 40 MG PO TABS: 40.0000 mg | ORAL_TABLET | Freq: Every day | ORAL | 2 refills | Status: DC

## 2017-02-04 MED ORDER — CLONAZEPAM 0.5 MG PO TABS: 0.7500 mg | ORAL_TABLET | Freq: Two times a day (BID) | ORAL | 0 refills | Status: DC | PRN

## 2017-02-04 NOTE — Assessment & Plan Note (Signed)
See AP anxiety above.  Increase citalopram to 40 mg daily.
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
## 2017-02-04 NOTE — Assessment & Plan Note (Signed)
See AP anxiety.

## 2017-02-04 NOTE — Assessment & Plan Note (Signed)
Pt with chronic anxiety and is currently controlled, but pt unhappy with reliance on alprazolam.  Would like to have fewer medication doses daily.  Pt also states she felt better when she was on higher dose citalopram.  Plan: 1. INCREASE citalopram to 40 mg once daily 2. STOP alprazolam.  START clonazepam 0.75 mg twice daily. Consider possiblity that pt will need 0.5 mg tid since was dosing alprazolam qid. 3. Consider future transition to buspirone for panic disorder instead of benzodiazepine. - PMP aware database reviewed.  Alprazolam is only controlled substance.   - Controlled substance contract signed. 4. Followup 4 weeks assess medication.  Reviewed w/ pt to call clinic if experiencing any s/sx of benzo withdrawal - reviewed s/sx.

## 2017-02-04 NOTE — Assessment & Plan Note (Signed)
Controlled hypertension.  BP at goal of <130/80 today.  Pt is currently working on lifestyle modifications.  Taking medications tolerating well without side effects. Intermittent dizziness noted, but no additional complications.  Plan: 1. Continue taking lisinopril 5 mg once daily, diltiazem 24hr tab 120 mg once daily 2. Encouraged heart healthy diet and increasing exercise to 30 minutes most days of the week. 3. Check BP 1-2 x per week at home, keep log, and bring to clinic at next appointment. 4. Follow up 3 months.

## 2017-02-04 NOTE — Patient Instructions (Addendum)
[name], Thank you for coming in to clinic today.  1. STOP alprazolam.  START clonazepam 0.75 mg twice daily.  Take one and a half 0.5 mg tablets for each dose. If you have shaking, sweating, lightheadness, or nausea, call clinic.  2. Increase citalopram to 40 mg once daily.    3. Continue all other medicines without changes.  Please schedule a follow-up appointment with Cassell Smiles, AGNP. Return in about 4 weeks (around 03/04/2017) for anxiety.  If you have any other questions or concerns, please feel free to call the clinic or send a message through [place]. You may also schedule an earlier appointment if necessary.  You will receive a survey after today's visit either digitally by e-mail or paper by [employer]. Your experiences and feedback matter to [name].  Please respond so we know how we are doing as we provide care for you.   Cassell Smiles, DNP, AGNP-BC Adult Gerontology Nurse Practitioner [place]

## 2017-02-06 ENCOUNTER — Telehealth: Payer: Self-pay | Admitting: Nurse Practitioner

## 2017-02-06 ENCOUNTER — Telehealth: Payer: Self-pay

## 2017-02-06 NOTE — Telephone Encounter (Signed)
Duplicate encounter

## 2017-02-06 NOTE — Telephone Encounter (Signed)
I called pt to get an update on symptoms with extra dose this am:  Pt states she is already feeling much better.  Will continue to take clonazepam 0.5 mg tablet: 2 tabs in am, 1 tab in afternoon, 2 tabs at bedtime. - Pt will call if she has any continuing symptoms.

## 2017-02-06 NOTE — Telephone Encounter (Signed)
The pt called stating she is having some withdrawal symptoms that started yesterday morning. She had been on Xanax and came in to the office on 12/31 and was switched to Clonazepam. Now she is complaining of shaking, sweating, nausea and just feeling malaise. I switched the call to Lauren to finish triage call.

## 2017-02-06 NOTE — Telephone Encounter (Signed)
Pt had continued to take some of her alprazolam through 02/06/16, which helped manage these symptoms yesterday.  Today, she feels she is in withdrawal and called clinic as instructed at her visit on 02/04/17.  She has not had vomiting or muscle spasms.  Pt has been taking clonazepam 0.75 mg bid since her appointment on 02/04/17.  Prior total dose alprazolam was 2.5 mg per day. Current total dose of clonazepam is 1.5 mg per day.  Plan to increase clonazepam to 2.5 mg total per day.  Pt reports she has taken her am dose today.   - Take one additional tablet now for 1.25 mg total am dose today. - Change daily dosing to clonazepam 1 mg in am, 0.5 mg between 3-4pm, 1 mg at bedtime (MN) - Pt to call today around 4 pm if symptoms are not beginning to improve.  Prescription will be updated after steady dose achieved for fill prior to running out of medication in 16 days on 02/22/17 with updated dosing as above. - May need to return to alprazolam for tapering dose as we start another medication for anxiety.

## 2017-02-07 ENCOUNTER — Telehealth: Payer: Self-pay | Admitting: Nurse Practitioner

## 2017-02-07 NOTE — Telephone Encounter (Signed)
Pt's grandson called said pt is not sleeping after being taken off xanax and put on clonazepam and is having a panic attack.  His call back number is [phone]

## 2017-02-07 NOTE — Telephone Encounter (Signed)
Pt called instead of grandson since he is not a designate party release of health information: bed 11-3, 3-5  - No vomiting.  Has had intermittent shaking between am and 3 pm doses.  She still awoke at night with mild shaking.  She has had significantly less sweating and vomiting and had suggested to wait one more night before considering another change of medication. - Reinforced that symptoms are improving. - Pt will call for appointment tomorrow if needed.

## 2017-02-08 ENCOUNTER — Telehealth: Payer: Self-pay | Admitting: Nurse Practitioner

## 2017-02-08 NOTE — Telephone Encounter (Signed)
Pt reports sleeping much better last night.  Is still having some shaking before next dose, but significantly improved.  Pt called to update that she does not feel she needs appointment or change in medication.  - Consider using gabapentin in future for clonazepam dose reductions.

## 2017-02-08 NOTE — Telephone Encounter (Signed)
Pt. called requesting that you call her

## 2017-02-11 DIAGNOSIS — Z79899 Other long term (current) drug therapy: Secondary | ICD-10-CM | POA: Diagnosis not present

## 2017-02-11 DIAGNOSIS — L408 Other psoriasis: Secondary | ICD-10-CM | POA: Diagnosis not present

## 2017-02-12 ENCOUNTER — Encounter: Payer: Self-pay | Admitting: Nurse Practitioner

## 2017-02-12 ENCOUNTER — Emergency Department
Admission: EM | Admit: 2017-02-12 | Discharge: 2017-02-12 | Disposition: A | Discharge status: Home / Self Care | Payer: PPO | Attending: Emergency Medicine | Admitting: Emergency Medicine

## 2017-02-12 ENCOUNTER — Other Ambulatory Visit: Payer: Self-pay

## 2017-02-12 ENCOUNTER — Emergency Department: Payer: PPO

## 2017-02-12 ENCOUNTER — Encounter: Payer: Self-pay | Admitting: Emergency Medicine

## 2017-02-12 DIAGNOSIS — Z79899 Other long term (current) drug therapy: Secondary | ICD-10-CM | POA: Insufficient documentation

## 2017-02-12 DIAGNOSIS — J441 Chronic obstructive pulmonary disease with (acute) exacerbation: Secondary | ICD-10-CM

## 2017-02-12 DIAGNOSIS — I700 Atherosclerosis of aorta: Secondary | ICD-10-CM | POA: Insufficient documentation

## 2017-02-12 DIAGNOSIS — F17210 Nicotine dependence, cigarettes, uncomplicated: Secondary | ICD-10-CM | POA: Diagnosis not present

## 2017-02-12 DIAGNOSIS — R05 Cough: Secondary | ICD-10-CM | POA: Diagnosis not present

## 2017-02-12 DIAGNOSIS — E039 Hypothyroidism, unspecified: Secondary | ICD-10-CM | POA: Insufficient documentation

## 2017-02-12 DIAGNOSIS — R0602 Shortness of breath: Secondary | ICD-10-CM

## 2017-02-12 DIAGNOSIS — I10 Essential (primary) hypertension: Secondary | ICD-10-CM | POA: Diagnosis not present

## 2017-02-12 MED ORDER — ALBUTEROL SULFATE (2.5 MG/3ML) 0.083% IN NEBU
5.0000 mg | INHALATION_SOLUTION | Freq: Once | RESPIRATORY_TRACT | Status: AC
  Administered 2017-02-12: 5 mg via RESPIRATORY_TRACT
  Filled 2017-02-12: qty 6

## 2017-02-12 MED ORDER — ONDANSETRON HCL 4 MG/2ML IJ SOLN
4.0000 mg | Freq: Once | INTRAMUSCULAR | Status: AC
  Administered 2017-02-12: 4 mg via INTRAVENOUS

## 2017-02-12 MED ORDER — PREDNISONE 20 MG PO TABS: 40.0000 mg | ORAL_TABLET | Freq: Every day | ORAL | 0 refills | Status: DC

## 2017-02-12 MED ORDER — ONDANSETRON HCL 4 MG/2ML IJ SOLN
INTRAMUSCULAR | Status: AC
  Administered 2017-02-12: 4 mg via INTRAVENOUS
  Filled 2017-02-12: qty 2

## 2017-02-12 MED ORDER — METHYLPREDNISOLONE SODIUM SUCC 125 MG IJ SOLR
125.0000 mg | Freq: Once | INTRAMUSCULAR | Status: AC
  Administered 2017-02-12: 125 mg via INTRAVENOUS
  Filled 2017-02-12: qty 2

## 2017-02-12 MED ORDER — DOXYCYCLINE HYCLATE 100 MG PO CAPS: 100.0000 mg | ORAL_CAPSULE | Freq: Two times a day (BID) | ORAL | 0 refills | Status: DC

## 2017-02-12 NOTE — ED Notes (Signed)
Patient to lobby in wheelchair. Verbalized understanding of discharge instructions and follow-up care.  

## 2017-02-12 NOTE — ED Triage Notes (Signed)
Patient from home via [facility]. Reports productive cough for last 3 days with worsening shortness of breath since yesterday. Patient reports history of COPD and recurrent pneumonia. Patient denies fever at home. Alert and oriented x4 and speaking in complete sentences.

## 2017-02-12 NOTE — ED Notes (Signed)
Patient given drink per MD. Resting in bed with no further needs expressed at this time.

## 2017-02-12 NOTE — ED Provider Notes (Signed)
[facility] Emergency Department Provider Note  ____________________________________________  Time seen: Approximately 2:05 PM  I have reviewed the triage vital signs and the nursing notes.   HISTORY  Chief Complaint Shortness of Breath    HPI [name] is a 67 y.o. female who complains of productive cough and shortness of breath for the past 3 days. Shortness of breath has been worse since yesterday. No fevers chills or sweats. No chest pain. Symptoms are waxing and waning, no aggravating or alleviating factors. Has a history of COPD and this feels similar. She's been using her inhaler at home which she just got refilled.     Past Medical History:  Diagnosis Date  . Anxiety   . Arthritis   . Chronic back pain   . Depression   . GERD (gastroesophageal reflux disease)   . Hypertension   . Hypothyroidism   . Pneumonia    RECENT X 3  . PONV (postoperative nausea and vomiting)   . Psoriasis   . Thyroid disease      Patient Active Problem List   Diagnosis Date Noted  . Cystocele, midline 11/08/2016  . Vaginal atrophy 11/08/2016  . Chronic left hip pain 05/07/2016  . Insomnia 01/10/2016  . COPD (chronic obstructive pulmonary disease) with chronic bronchitis ([place]) 12/17/2015  . Bilateral carotid artery stenosis 08/22/2015  . Atherosclerotic peripheral vascular disease with intermittent claudication ([place]) 08/04/2015  . Benign essential HTN 08/04/2015  . Mixed hyperlipidemia 08/04/2015  . Abnormal finding on EKG 07/25/2015  . Gravida 2 para 2 07/01/2015  . Panic disorder without agoraphobia with moderate panic attacks 07/01/2015  . Prediabetes 06/09/2015  . Lichen sclerosus 16/11/9602  . Abdominal pain, LLQ 02/01/2015  . Chronic LBP 09/16/2014  . Clinical depression 08/19/2014  . Adult hypothyroidism 08/19/2014  . Anxiety disorder 06/23/2014  . Other long term (current) drug therapy 08/05/2012  . Bladder retention 10/25/2008  . Lumbar  canal stenosis 10/21/2008  . Current tobacco use 09/19/2008  . Degeneration of intervertebral disc of lumbosacral region 03/22/2008     Past Surgical History:  Procedure Laterality Date  . ABDOMINAL HYSTERECTOMY     heavy bleeding  . bladder stimulator    . CARPAL TUNNEL RELEASE    . CATARACT EXTRACTION W/PHACO Right 03/07/2015   Procedure: CATARACT EXTRACTION PHACO AND INTRAOCULAR LENS PLACEMENT (IOC);  Surgeon: Estill Cotta, MD;  Location: [facility] ORS;  Service: Ophthalmology;  Laterality: Right;  [name] 01:14 AP% 26.4 CDE 33.04 fluid pack lot # [id number]  . CATARACT EXTRACTION W/PHACO Left 03/21/2015   Procedure: CATARACT EXTRACTION PHACO AND INTRAOCULAR LENS PLACEMENT (IOC);  Surgeon: Estill Cotta, MD;  Location: [facility] ORS;  Service: Ophthalmology;  Laterality: Left;  [name] 01:21 AP% 25.6 CDE 39.78 fluid pack lot # [id number]  . HAND SURGERY Right   . INTERSTIM IMPLANT REMOVAL N/A 09/07/2015   Procedure: REMOVAL OF INTERSTIM IMPLANT;  Surgeon: Bjorn Loser, MD;  Location: [facility] ORS;  Service: Urology;  Laterality: N/A;  . OOPHORECTOMY Right    ovarian cyst     Prior to Admission medications   Medication Sig Start Date End Date Taking? Authorizing Provider  acetaminophen (TYLENOL) 500 MG tablet Take 500 mg by mouth every 6 (six) hours as needed for moderate pain. Reported on 07/01/2015   Yes [provider]  albuterol (PROVENTIL HFA;VENTOLIN HFA) 108 (90 Base) MCG/ACT inhaler Inhale 2 puffs into the lungs every 6 (six) hours as needed for wheezing or shortness of breath. 11/06/16  Yes Keith Rake  Asad A, MD  augmented betamethasone dipropionate (DIPROLENE-AF) 0.05 % cream Apply 1 application topically daily. 02/11/17  Yes [provider]  calcipotriene (DOVONOX) 0.005 % cream Apply 1 application topically daily. 02/12/17  Yes [provider]  citalopram (CELEXA) 40 MG tablet Take 1 tablet (40 mg total) by mouth daily. 02/04/17  Yes Mikey College, NP   clonazePAM (KLONOPIN) 0.5 MG tablet Take 1.5 tablets (0.75 mg total) by mouth 2 (two) times daily as needed for anxiety. 02/04/17  Yes Mikey College, NP  clotrimazole-betamethasone (LOTRISONE) cream Apply 1 application topically 2 (two) times daily. 11/08/16  Yes Gae Dry, MD  diltiazem (CARDIZEM CD) 120 MG 24 hr capsule Take 1 capsule (120 mg total) by mouth daily. 11/06/16  Yes Roselee Nova, MD  levothyroxine (SYNTHROID) 100 MCG tablet Take 1 tablet (100 mcg total) by mouth daily with breakfast. 04/09/16  Yes Rochel Brome A, MD  lisinopril (PRINIVIL,ZESTRIL) 10 MG tablet Take 0.5 tablets (5 mg total) by mouth daily. 11/20/16  Yes Hubbard Hartshorn, FNP  Secukinumab (COSENTYX) 150 MG/ML SOSY Inject 150 mg into the skin every 14 (fourteen) days. 2 injections per month   Yes [provider]  triamcinolone (KENALOG) 0.025 % ointment Apply 1 application topically as directed.  02/08/16  Yes [provider]  doxycycline (VIBRAMYCIN) 100 MG capsule Take 1 capsule (100 mg total) by mouth 2 (two) times daily. 02/12/17   Carrie Mew, MD  mirabegron ER (MYRBETRIQ) 25 MG TB24 tablet Take 1 tablet (25 mg total) by mouth daily. Patient not taking: Reported on 12/06/2016 08/29/16   Zara Council A, PA-C  predniSONE (DELTASONE) 20 MG tablet Take 2 tablets (40 mg total) by mouth daily. 02/12/17   Carrie Mew, MD     Allergies Ciprofloxacin; Oxycodone; Oxycodone-acetaminophen; Oxycodone-acetaminophen; and Sulfa antibiotics   Family History  Problem Relation Age of Onset  . Heart disease Mother   . Breast cancer Mother   . Heart attack Mother   . COPD Father   . Stroke Paternal Uncle   . Kidney disease Sister   . Breast cancer Cousin   . Breast cancer Cousin   . Heart attack Maternal Aunt   . Stroke Paternal Grandfather   . Kidney cancer Neg Hx   . Bladder Cancer Neg Hx     Social History Social History   Tobacco Use  . Smoking status: Current Every  Day Smoker    Packs/day: 1.00    Years: 35.00    Pack years: 35.00    Types: Cigarettes  . Smokeless tobacco: Never Used  . Tobacco comment: Previously smoked 2 ppd  Substance Use Topics  . Alcohol use: No  . Drug use: No    Review of Systems  Constitutional:   No fever or chills.  ENT:   No sore throat. No rhinorrhea. Cardiovascular:   No chest pain or syncope. Respiratory:   Positive shortness of breath and cough. Gastrointestinal:   Negative for abdominal pain, vomiting and diarrhea.  Musculoskeletal:   Negative for focal pain or swelling All other systems reviewed and are negative except as documented above in ROS and HPI.  ____________________________________________   PHYSICAL EXAM:  VITAL SIGNS: ED Triage Vitals  Enc Vitals Group     BP 02/12/17 1213 (!) 158/76     Pulse Rate 02/12/17 1213 93     Resp 02/12/17 1213 20     Temp 02/12/17 1213 98.1 F (36.7 C)  Temp Source 02/12/17 1213 Oral     SpO2 02/12/17 1210 95 %     Weight 02/12/17 1213 175 lb (79.4 kg)     Height 02/12/17 1213 5\' 3"  (1.6 m)     Head Circumference --      Peak Flow --      Pain Score --      Pain Loc --      Pain Edu? --      Excl. in [place]? --     Vital signs reviewed, nursing assessments reviewed.   Constitutional:   Alert and oriented. Mild respiratory distress Eyes:   No scleral icterus.  EOMI. No nystagmus. No conjunctival pallor. PERRL. ENT   Head:   Normocephalic and atraumatic.   Nose:   No congestion/rhinnorhea.    Mouth/Throat:   MMM, no pharyngeal erythema. No peritonsillar mass.    Neck:   No meningismus. Full ROM. Hematological/Lymphatic/Immunilogical:   No cervical lymphadenopathy. Cardiovascular:   RRR. Symmetric bilateral radial and DP pulses.  No murmurs.  Respiratory:   Normal respiratory effort without tachypnea/retractions. Diffuse expiratory wheezing with prolonged expiratory phase, worsened with coughing or FEV1 maneuver. Good air entry in all  lung fields. No inspiratory crackles Gastrointestinal:   Soft and nontender. Non distended. There is no CVA tenderness.  No rebound, rigidity, or guarding. Genitourinary:   deferred Musculoskeletal:   Normal range of motion in all extremities. No joint effusions.  No lower extremity tenderness.  No edema. Neurologic:   Normal speech and language.  Motor grossly intact. No gross focal neurologic deficits are appreciated.  Skin:    Skin is warm, dry and intact. No rash noted.  No petechiae, purpura, or bullae.  ____________________________________________    LABS (pertinent positives/negatives) (all labs ordered are listed, but only abnormal results are displayed) Labs Reviewed - No data to display ____________________________________________   EKG  Interpreted by me Sinus rhythm rate of 90, normal axis and intervals. Normal QRS ST segments and T waves. No evidence of acute ischemia.  ____________________________________________    RADIOLOGY  Dg Chest 2 View  Result Date: 02/12/2017 CLINICAL DATA:  Chest tightness, shortness of breath and cough EXAM: CHEST  2 VIEW COMPARISON:  None FINDINGS: The heart size and mediastinal contours are within normal limits. Aortic atherosclerosis. Both lungs are clear. The visualized skeletal structures are unremarkable. IMPRESSION: 1. No acute findings. 2.  Aortic Atherosclerosis (ICD10-I70.0). Electronically Signed   By: Kerby Moors M.D.   On: 02/12/2017 12:32    ____________________________________________   PROCEDURES Procedures  ____________________________________________    CLINICAL IMPRESSION / ASSESSMENT AND PLAN / ED COURSE  Pertinent labs & imaging results that were available during my care of the patient were reviewed by me and considered in my medical decision making (see chart for details).   Patient well-appearing, no significant distress, presents with wheezing consistent with COPD exacerbation.Considering the  patient's symptoms, medical history, and physical examination today, I have low suspicion for ACS, PE, TAD, pneumothorax, carditis, mediastinitis, pneumonia, CHF, or sepsis.  Patient treated with bronchodilators and steroids here in the ED. Plan to continue her on prednisone and doxycycline at home, follow-up with primary care.      ____________________________________________   FINAL CLINICAL IMPRESSION(S) / ED DIAGNOSES    Final diagnoses:  COPD exacerbation ([place])  Shortness of breath       Portions of this note were generated with dragon dictation software. Dictation errors may occur despite best attempts at proofreading.    Carrie Mew,  MD 02/12/17 1410

## 2017-02-12 NOTE — ED Notes (Signed)
Pt reports nausea. MD notified. Order for zofran, see MAR

## 2017-02-18 ENCOUNTER — Other Ambulatory Visit: Payer: Self-pay | Admitting: Nurse Practitioner

## 2017-02-18 DIAGNOSIS — F410 Panic disorder [episodic paroxysmal anxiety] without agoraphobia: Secondary | ICD-10-CM

## 2017-02-18 MED ORDER — CLONAZEPAM 0.5 MG PO TABS: ORAL_TABLET | ORAL | 0 refills | Status: DC

## 2017-02-18 NOTE — Telephone Encounter (Signed)
[name], Please call pt to discuss refill for clonazePAM (KLONOPIN) 0.5 MG tablet [[id number]]  Thank you, Jill Alexanders

## 2017-02-18 NOTE — Telephone Encounter (Signed)
The pt was notified. No questions or concerns. 

## 2017-02-18 NOTE — Telephone Encounter (Signed)
I have refilled this prescription to start on 02/21/17 when pt will run out based on increasing her dose.

## 2017-02-18 NOTE — Telephone Encounter (Signed)
The pt is currently taking Clonazepam as directed (2)two tablets in the morning, 1 (one) tablet in the evening and (2) tablets at hs.She states the Clonazepam is really helping and she is glad she is off of Xanax. She states she need a refill of the medication since you up the prescription it caused her to run out of medication sooner.

## 2017-02-26 ENCOUNTER — Ambulatory Visit: Payer: Self-pay

## 2017-03-04 ENCOUNTER — Ambulatory Visit: Payer: Self-pay | Admitting: Nurse Practitioner

## 2017-03-08 ENCOUNTER — Other Ambulatory Visit: Payer: Self-pay | Admitting: Family Medicine

## 2017-03-08 DIAGNOSIS — E039 Hypothyroidism, unspecified: Secondary | ICD-10-CM

## 2017-03-11 ENCOUNTER — Other Ambulatory Visit: Payer: Self-pay | Admitting: Family Medicine

## 2017-03-11 ENCOUNTER — Ambulatory Visit: Payer: Self-pay | Admitting: Nurse Practitioner

## 2017-03-11 DIAGNOSIS — E039 Hypothyroidism, unspecified: Secondary | ICD-10-CM

## 2017-03-11 DIAGNOSIS — I10 Essential (primary) hypertension: Secondary | ICD-10-CM

## 2017-03-12 ENCOUNTER — Other Ambulatory Visit: Payer: Self-pay | Admitting: Nurse Practitioner

## 2017-03-12 DIAGNOSIS — I10 Essential (primary) hypertension: Secondary | ICD-10-CM

## 2017-03-14 ENCOUNTER — Other Ambulatory Visit: Payer: Self-pay

## 2017-03-14 ENCOUNTER — Ambulatory Visit: Payer: PPO | Admitting: Nurse Practitioner

## 2017-03-14 ENCOUNTER — Encounter: Payer: Self-pay | Admitting: Nurse Practitioner

## 2017-03-14 VITALS — BP 134/62 | HR 90 | Temp 98.5°F | Ht 63.0 in | Wt 177.2 lb

## 2017-03-14 DIAGNOSIS — E039 Hypothyroidism, unspecified: Secondary | ICD-10-CM

## 2017-03-14 DIAGNOSIS — F410 Panic disorder [episodic paroxysmal anxiety] without agoraphobia: Secondary | ICD-10-CM

## 2017-03-14 DIAGNOSIS — R7989 Other specified abnormal findings of blood chemistry: Secondary | ICD-10-CM | POA: Diagnosis not present

## 2017-03-14 DIAGNOSIS — Z79899 Other long term (current) drug therapy: Secondary | ICD-10-CM | POA: Diagnosis not present

## 2017-03-14 DIAGNOSIS — F5104 Psychophysiologic insomnia: Secondary | ICD-10-CM | POA: Diagnosis not present

## 2017-03-14 DIAGNOSIS — J449 Chronic obstructive pulmonary disease, unspecified: Secondary | ICD-10-CM | POA: Diagnosis not present

## 2017-03-14 MED ORDER — GABAPENTIN 300 MG PO CAPS: 300.0000 mg | ORAL_CAPSULE | Freq: Every day | ORAL | 3 refills | Status: DC

## 2017-03-14 MED ORDER — TIOTROPIUM BROMIDE MONOHYDRATE 18 MCG IN CAPS: 18.0000 ug | ORAL_CAPSULE | Freq: Every day | RESPIRATORY_TRACT | 12 refills | Status: DC

## 2017-03-14 MED ORDER — LEVOTHYROXINE SODIUM 100 MCG PO TABS: 100.0000 ug | ORAL_TABLET | Freq: Every day | ORAL | 1 refills | Status: DC

## 2017-03-14 MED ORDER — CLONAZEPAM 0.5 MG PO TABS: ORAL_TABLET | ORAL | 0 refills | Status: DC

## 2017-03-14 NOTE — Patient Instructions (Addendum)
[name], Thank you for coming in to clinic today.  1. For your clonazepam - Reduce to 1 tab at bedtime.  Take 2 tabs in am and 1 tablet in afternoon and 1tab at bedtime.  - IF you have withdrawal symptoms beyond mildly increased anxiety, call clinic and I will tell you what to change with your doses.  2. START using gabapentin 300 mg one tablet at bedtime.  This is used to help with anxiety, sleep, and your pain.  3. START spiriva 1 inhalation once daily.  Please schedule a follow-up appointment with Cassell Smiles, AGNP. Return in about 4 weeks (around 04/11/2017) for anxiety.  If you have any other questions or concerns, please feel free to call the clinic or send a message through [place]. You may also schedule an earlier appointment if necessary.  You will receive a survey after today's visit either digitally by e-mail or paper by [employer]. Your experiences and feedback matter to [name].  Please respond so we know how we are doing as we provide care for you.   Cassell Smiles, DNP, AGNP-BC Adult Gerontology Nurse Practitioner [place]

## 2017-03-14 NOTE — Progress Notes (Signed)
Subjective:    Patient ID: [name], female    DOB: 1950/08/29, 67 y.o.   MRN: [id number]  [name] is a 67 y.o. female presenting on 03/14/2017 for Anxiety   HPI Anxiety Pt with clonazepam 0.5 mg tablets taking 2 tabs am, 1 tab afternoon, 2 tab bedtime.  Multiple stressors are complicating current symptoms: - Has stressor currently with grandson - cardiology issues. - Financial stress - no transportation and needing to depending on people.  Will work on obtaining a car end of February.   - Feels isolated.  URI and prednisone a few weeks ago.   - Sickness made her even more depressed - back to church last night after about a week away.  She regularly relies on this outing for social support.  COPD exacerbation: - no long-term use of inhalers. - Pt has continued having cough, wheezing, shortness of breath after URI. - Pt denies fever, chills, sweats, nausea, vomiting, diarrhea and constipation.  Social History   Tobacco Use  . Smoking status: Current Every Day Smoker    Packs/day: 1.00    Years: 35.00    Pack years: 35.00    Types: Cigarettes  . Smokeless tobacco: Never Used  . Tobacco comment: Previously smoked 2 ppd  Substance Use Topics  . Alcohol use: No  . Drug use: No    Review of Systems Per HPI unless specifically indicated above     Objective:    BP 134/62 (BP Location: Right Arm, Patient Position: Sitting)   Pulse 90   Temp 98.5 F (36.9 C) (Oral)   Ht 5\' 3"  (1.6 m)   Wt 177 lb 3.2 oz (80.4 kg)   BMI 31.39 kg/m   Wt Readings from Last 3 Encounters:  03/14/17 177 lb 3.2 oz (80.4 kg)  02/12/17 175 lb (79.4 kg)  02/04/17 175 lb 12.8 oz (79.7 kg)    Physical Exam  Constitutional: She is oriented to person, place, and time. She appears well-developed and well-nourished. No distress.  HENT:  Head: Normocephalic and atraumatic.  Cardiovascular: Normal rate, regular rhythm, S1 normal, S2 normal, normal heart sounds and intact distal pulses.    Pulmonary/Chest: Effort normal and breath sounds normal. No respiratory distress.  Neurological: She is alert and oriented to person, place, and time.  Skin: Skin is warm and dry.  Psychiatric: Thought content normal. Her mood appears anxious. Her speech is rapid and/or pressured. She is agitated and hyperactive.  Vitals reviewed.  Results for orders placed or performed in visit on 03/14/17  TSH  Result Value Ref Range   TSH 18.05 (H) 0.40 - 4.50 mIU/L  COMPLETE METABOLIC PANEL WITH GFR  Result Value Ref Range   Glucose, Bld 91 65 - 99 mg/dL   BUN 15 7 - 25 mg/dL   Creat 1.02 (H) 0.50 - 0.99 mg/dL   GFR, Est Non African American 56 (L) > OR = 60 mL/min/1.44m2   GFR, Est African American 65 > OR = 60 mL/min/1.61m2   BUN/Creatinine Ratio 15 6 - 22 (calc)   Sodium 139 135 - 146 mmol/L   Potassium 4.2 3.5 - 5.3 mmol/L   Chloride 104 98 - 110 mmol/L   CO2 27 20 - 32 mmol/L   Calcium 9.5 8.6 - 10.4 mg/dL   Total Protein 6.7 6.1 - 8.1 g/dL   Albumin 4.1 3.6 - 5.1 g/dL   Globulin 2.6 1.9 - 3.7 g/dL (calc)   AG Ratio 1.6 1.0 - 2.5 (calc)  Total Bilirubin 0.4 0.2 - 1.2 mg/dL   Alkaline phosphatase (APISO) 98 33 - 130 U/L   AST 16 10 - 35 U/L   ALT 12 6 - 29 U/L      Assessment & Plan:   Problem List Items Addressed This Visit      Respiratory   COPD (chronic obstructive pulmonary disease) with chronic bronchitis (HCC) - Primary (Chronic) Consistent with mild acute exacerbation of COPD with worsening productive cough, but is now slowly improving.   - No hypoxia, afebrile, no recent hospitalization - Continues Albuterol, no maintenance inhalers  Plan: 1. Start Spiriva one inhalation once daily. 2. Use albuterol q 4 hr regularly x 2-3 days. Continue maintenance inhalers 3. Considered antibiotics, however afebrile and no hypoxia, would not be indicated in mild AECOPD, unless recurrent 4. RTC about 1 week if not improving, otherwise strict return criteria to go to ED   Relevant  Medications   tiotropium (SPIRIVA) 18 MCG inhalation capsule     Other   Anxiety disorder (Chronic) Insomnia   Relevant Medications   clonazePAM (KLONOPIN) 0.5 MG tablet   gabapentin (NEURONTIN) 300 MG capsule    Anxiety and insomnia not improving.  SLightly worsening with increased life stressors. GAD 7 stable.  Overall, pt reports she is feeling better on clonazepam than alprazolam.  Plan: 1. Continue clonazepam taper as planned.  1 tab at bedtime, 2 tabs in am and 1 tab in afternoon.  Total daily dose reduced by 0.5 mg. 2. START gabapentin 300 mg one tablet at bedtime for anxiety, sleep and pain.  Can increase in future if tolerated.  Also will likely assist with benzo taper symptoms. 3. Continue good stress management strategies. 4. Followup 4 weeks for further clonazepam dose tapering and increase of gabapentin as needed.    Other Visit Diagnoses    Acquired hypothyroidism     Pt without recent monitoring of TSH for levothyroxine dose.    Plan: Check TSH.  - Result abnormal.  Change to levothyroxine 125 mcg daily and recheck TSH/FreeT4 in 6 weeks.   Relevant Medications   levothyroxine (SYNTHROID, LEVOTHROID) 125 MCG tablet   Other Relevant Orders   TSH (Completed)   TSH + free T4   Long-term use of high-risk medication       Relevant Orders   Comprehensive metabolic panel   Elevated serum creatinine       Relevant Orders   BASIC METABOLIC PANEL WITH GFR      Meds ordered this encounter  Medications  . tiotropium (SPIRIVA) 18 MCG inhalation capsule    Sig: Place 1 capsule (18 mcg total) into inhaler and inhale daily.    Dispense:  30 capsule    Refill:  12    Order Specific Question:   Supervising Provider    Answer:   Olin Hauser [[id number]]  . clonazePAM (KLONOPIN) 0.5 MG tablet    Sig: Take 2 tablets (1mg  total) in morning. Take 1 tablet (0.5mg  total) in afternoon/evening. Take 2 tablets (1mg  total) at bedtime.    Dispense:  150 tablet    Refill:  0     Order Specific Question:   Supervising Provider    Answer:   Olin Hauser [[id number]]  . gabapentin (NEURONTIN) 300 MG capsule    Sig: Take 1 capsule (300 mg total) by mouth at bedtime.    Dispense:  30 capsule    Refill:  3    Order Specific Question:   Supervising Provider  Answer:   Olin Hauser [[id number]]  . DISCONTD: levothyroxine (SYNTHROID) 100 MCG tablet    Sig: Take 1 tablet (100 mcg total) by mouth daily with breakfast.    Dispense:  30 tablet    Refill:  1    Order Specific Question:   Supervising Provider    Answer:   Olin Hauser [[id number]]  . levothyroxine (SYNTHROID, LEVOTHROID) 125 MCG tablet    Sig: Take 1 tablet (125 mcg total) by mouth daily with breakfast.    Dispense:  30 tablet    Refill:  5    Order Specific Question:   Supervising Provider    Answer:   Olin Hauser [[id number]]      Follow up plan: Return in about 4 weeks (around 04/11/2017) for anxiety.  Cassell Smiles, DNP, AGPCNP-BC Adult Gerontology Primary Care Nurse Practitioner [facility] 04/03/2017, 8:09 PM

## 2017-03-15 LAB — COMPLETE METABOLIC PANEL WITH GFR
AG Ratio: 1.6 (calc) (ref 1.0–2.5)
ALT: 12 U/L (ref 6–29)
AST: 16 U/L (ref 10–35)
Albumin: 4.1 g/dL (ref 3.6–5.1)
Alkaline phosphatase (APISO): 98 U/L (ref 33–130)
BUN/Creatinine Ratio: 15 (calc) (ref 6–22)
BUN: 15 mg/dL (ref 7–25)
CO2: 27 mmol/L (ref 20–32)
Calcium: 9.5 mg/dL (ref 8.6–10.4)
Chloride: 104 mmol/L (ref 98–110)
Creat: 1.02 mg/dL — ABNORMAL HIGH (ref 0.50–0.99)
GFR, Est African American: 65 mL/min/{1.73_m2} (ref >=60)
GFR, Est Non African American: 56 mL/min/{1.73_m2} — ABNORMAL LOW (ref >=60)
Globulin: 2.6 g/dL (calc) (ref 1.9–3.7)
Glucose, Bld: 91 mg/dL (ref 65–99)
Potassium: 4.2 mmol/L (ref 3.5–5.3)
Sodium: 139 mmol/L (ref 135–146)
Total Bilirubin: 0.4 mg/dL (ref 0.2–1.2)
Total Protein: 6.7 g/dL (ref 6.1–8.1)

## 2017-03-15 LAB — TSH: TSH: 18.05 mIU/L — ABNORMAL HIGH (ref 0.40–4.50)

## 2017-03-15 MED ORDER — LEVOTHYROXINE SODIUM 125 MCG PO TABS: 125.0000 ug | ORAL_TABLET | Freq: Every day | ORAL | 5 refills | Status: DC

## 2017-03-20 ENCOUNTER — Telehealth: Payer: Self-pay | Admitting: Nurse Practitioner

## 2017-03-20 NOTE — Telephone Encounter (Signed)
The pt called complaining of recurrent panic attacks since the decrease of her klonopin. She states it started over the weekend and today she states she had one that was very bad. She admits that she took (2) two of klonopin to help with her symptoms.

## 2017-03-20 NOTE — Telephone Encounter (Signed)
Called pt for further directions: Pt reports taking her clonazepam daily since 03/14/17 visit with 1 tablet am, 1 tablet afternoon, 1 tablet bedtime.  Written instructions to patient were to take 2 tabs am, 1 tab afternoon, 1 tab bedtime, so pt is currently experiencing withdrawal (shakiness, increased anxiety) from benzos from reducing medication too quickly. - denies sweating, nausea  Today am: 2 tablets Afternoon: 1 tablet Bedtime: - will take 2 tablets to return to her prior stable dose.  Pt to return to prior dose of total 2.5 mg per day until next visit on 04/04/17.  Pt verbalizes understanding.

## 2017-03-20 NOTE — Telephone Encounter (Signed)
Lauren decreased pt's klonopin and she is having panic attacks now.  Her call back number is [phone]

## 2017-03-25 ENCOUNTER — Telehealth: Payer: Self-pay

## 2017-03-25 NOTE — Telephone Encounter (Signed)
The pt call requesting a refill on her clonazepam.

## 2017-03-26 NOTE — Telephone Encounter (Signed)
Personally reviewed [place] today, 03/26/17.  According to PMP aware, pt last received a refill on 03/25/2017.  Refill of her controlled substance will not be provided today.  Instead, we will address at her next visit and provide her refill then.

## 2017-03-26 NOTE — Telephone Encounter (Signed)
The pt confirmed that she got her medication filled on yesterday.

## 2017-04-03 ENCOUNTER — Encounter: Payer: Self-pay | Admitting: Nurse Practitioner

## 2017-04-09 DIAGNOSIS — M5416 Radiculopathy, lumbar region: Secondary | ICD-10-CM | POA: Diagnosis not present

## 2017-04-11 ENCOUNTER — Ambulatory Visit: Payer: PPO | Admitting: Nurse Practitioner

## 2017-04-11 ENCOUNTER — Other Ambulatory Visit: Payer: Self-pay

## 2017-04-11 VITALS — BP 147/78 | HR 91 | Temp 98.0°F | Ht 62.0 in | Wt 180.0 lb

## 2017-04-11 DIAGNOSIS — F3341 Major depressive disorder, recurrent, in partial remission: Secondary | ICD-10-CM | POA: Diagnosis not present

## 2017-04-11 DIAGNOSIS — F410 Panic disorder [episodic paroxysmal anxiety] without agoraphobia: Secondary | ICD-10-CM | POA: Diagnosis not present

## 2017-04-11 DIAGNOSIS — Z79899 Other long term (current) drug therapy: Secondary | ICD-10-CM

## 2017-04-11 MED ORDER — CLONAZEPAM 1 MG PO TABS: ORAL_TABLET | ORAL | 0 refills | Status: DC

## 2017-04-11 MED ORDER — CITALOPRAM HYDROBROMIDE 40 MG PO TABS: 40.0000 mg | ORAL_TABLET | Freq: Every day | ORAL | 1 refills | Status: DC

## 2017-04-11 MED ORDER — GABAPENTIN 300 MG PO CAPS: ORAL_CAPSULE | ORAL | 1 refills | Status: DC

## 2017-04-11 NOTE — Progress Notes (Signed)
Subjective:    Patient ID: [name], female    DOB: 08/21/1950, 67 y.o.   MRN: [id number]  [name] is a 67 y.o. female presenting on 04/11/2017 for Follow-up (Anxiety. Also wants to discuss medicine given by her ortho Dr.)   HPI Anxiety Pt has previously asked to change from Xanax to alternative treatment option in past.  During transition from Xanax to clonazepam, pt experienced significant withdrawal symptoms.  She also had withdrawal symptoms when reducing dose after last visit because she did not follow exact instructions and self-decreased dose more than prescribed.  Currently, pt is not experiencing withdrawal and is taking 2 tablets (1 mg total) in am, 1 tablet (0.5 mg total) in afternoon, and 2 tablets (1 mg total) in pm.  She had improved symptoms with reduction after last visit with addition of gabapentin for benzo taper. - Increased dose of Celexa to 40 mg once daily and doing much better there.  Still having situational anxiety and social anxiety, however.  Sleep is improving.  Medication questions: Prednisone, tramadol and cyclobenzaprine: provided by orthopedic MD.  Pt is concerned that this medication is not safe.  It is helping her symptoms of pain, but she would like to review it as it makes her sleepy when she takes it.  Depression screen [facility] 2/9 04/11/2017 03/14/2017 11/06/2016 10/10/2016 09/07/2016  Decreased Interest 1 1 0 0 0  Down, Depressed, Hopeless 1 1 0 1 1  PHQ - 2 Score 2 2 0 1 1  Altered sleeping 0 1 - - -  Tired, decreased energy - 0 - - -  Change in appetite 1 0 - - -  Feeling bad or failure about yourself  0 0 - - -  Trouble concentrating 1 0 - - -  Moving slowly or fidgety/restless 0 0 - - -  Suicidal thoughts 0 0 - - -  PHQ-9 Score 4 3 - - -  Difficult doing work/chores Not difficult at all Not difficult at all - - -       Social History   Tobacco Use  . Smoking status: Current Every Day Smoker    Packs/day: 1.00    Years: 35.00    Pack  years: 35.00    Types: Cigarettes  . Smokeless tobacco: Never Used  . Tobacco comment: Previously smoked 2 ppd  Substance Use Topics  . Alcohol use: No  . Drug use: No    Review of Systems Per HPI unless specifically indicated above     Objective:    BP (!) 147/78 (BP Location: Left Arm, Patient Position: Sitting, Cuff Size: Normal)   Pulse 91   Temp 98 F (36.7 C)   Ht 5\' 2"  (1.575 m)   Wt 180 lb (81.6 kg)   BMI 32.92 kg/m   Wt Readings from Last 3 Encounters:  04/25/17 178 lb 9.6 oz (81 kg)  04/11/17 180 lb (81.6 kg)  03/14/17 177 lb 3.2 oz (80.4 kg)    Physical Exam  Constitutional: She is oriented to person, place, and time. She appears well-developed and well-nourished. No distress.  HENT:  Head: Normocephalic and atraumatic.  Neck: Normal range of motion. Neck supple. Carotid bruit is not present.  Cardiovascular: Normal rate, regular rhythm, S1 normal, S2 normal, normal heart sounds and intact distal pulses.  Pulmonary/Chest: Effort normal and breath sounds normal. No respiratory distress.  Musculoskeletal: She exhibits no edema (pedal).  Neurological: She is alert and oriented to person, place, and  time. Gait (antalgic) abnormal.  Skin: Skin is warm and dry.  Psychiatric: Her speech is normal. Judgment and thought content normal. Her mood appears anxious. She is hyperactive (fidgeting during visit). Cognition and memory are normal.  Forde Dandy is normal for patient, however, is slightly guarded with sharing information.  Vitals reviewed.        Assessment & Plan:   Problem List Items Addressed This Visit      Other   Anxiety disorder - Primary (Chronic)   Relevant Medications   gabapentin (NEURONTIN) 300 MG capsule   citalopram (CELEXA) 40 MG tablet   clonazePAM (KLONOPIN) 1 MG tablet   Clinical depression (Chronic)   Relevant Medications   citalopram (CELEXA) 40 MG tablet  Anxiety and depression are stable on current medications.  Pt is continuing  to tolerate clonazepam transition and taper well. Pt had improvement of taper dose with addition of gabapentin, which was used per evidence for supporting benzo taper and reduction of withdrawal symptoms.  Plan: 1. Continue clonazepam taper with reduction to 1 mg twice daily. 2. Take gabapentin 300 mg in am and 600 mg at bedtime.  Use for some pain control instead of tramadol and cyclobenzaprine. 3. Continue Celexa at 40 mg once daily. 4. Followup 4 weeks.  May consider additional taper of clonazepam vs stopping taper for several months and resuming at another date.  Pt is becoming increasingly resistant to change 2/2 fear of increased anxiety and losing control over medications.    Other Visit Diagnoses    Medication management     Pt with questions about prednisone, tramadol, and cyclobenzaprine.  Had lengthy discussion about action of prednisone for reducing musculoskeletal pain through reduced inflammation.  Also discussed that with use of gabapentin and clonazepam, tramadol and cyclobenzaprine could cause significant drowsiness if taken together and to avoid taking all at same dose.  Plan: 1. Continue tramadol and cyclobenzaprine sparingly. 2. Defer management in future of these medications to her orthopedic.  Will not be continued from this clinic and pt verbalizes understanding.      Meds ordered this encounter  Medications  . gabapentin (NEURONTIN) 300 MG capsule    Sig: Take 1 tablet (300 mg total) in am.  Take 2 tablets (600 mg total) at bedtime.    Dispense:  270 capsule    Refill:  1    Order Specific Question:   Supervising Provider    Answer:   Olin Hauser [[id number]]  . citalopram (CELEXA) 40 MG tablet    Sig: Take 1 tablet (40 mg total) by mouth daily.    Dispense:  90 tablet    Refill:  1    Order Specific Question:   Supervising Provider    Answer:   Olin Hauser [[id number]]  . clonazePAM (KLONOPIN) 1 MG tablet    Sig: Take 1 tablet (1mg  total) in  morning. Take 1 tablet (1mg  total) at bedtime.    Dispense:  60 tablet    Refill:  0    Order Specific Question:   Supervising Provider    Answer:   Olin Hauser [[id number]]    Follow up plan: Return in about 4 weeks (around 05/09/2017) for anxiety.  A total of 35 minutes was spent face-to-face with this patient. Greater than 50% of this time was spent in counseling and coordination of care with the patient.   Cassell Smiles, DNP, AGPCNP-BC Adult Gerontology Primary Care Nurse Practitioner [facility] 05/05/2017,  11:48 AM

## 2017-04-11 NOTE — Patient Instructions (Addendum)
[name], Thank you for coming in to clinic today.  1. For your clonazepam: - AFTER you are finished with prednisone, Start your next taper. Take 1 tablet (1 mg) in am and take 1 tablet (1 mg) at bedtime.  - INCREASE gabapentin to 1 tablet (300 mg) in am and 2 tablets (600 mg) at bedtime.  This will help anxiety and pain. - Call if you are having any extra sleepiness. - When you Stop your afternoon clonazepam, you can take 1 tablet of gabapentin in am, afternoon and 1 tablet at bedtime if needed.  Please schedule a follow-up appointment with Cassell Smiles, AGNP. Return in about 4 weeks (around 05/09/2017) for anxiety.  If you have any other questions or concerns, please feel free to call the clinic or send a message through [place]. You may also schedule an earlier appointment if necessary.   You will receive a survey after today's visit either digitally by e-mail or paper by [employer]. Your experiences and feedback matter to [name].  Please respond so we know how we are doing as we provide care for you.   Cassell Smiles, DNP, AGNP-BC Adult Gerontology Nurse Practitioner [place]

## 2017-04-25 ENCOUNTER — Encounter: Payer: Self-pay | Admitting: Nurse Practitioner

## 2017-04-25 ENCOUNTER — Ambulatory Visit (INDEPENDENT_AMBULATORY_CARE_PROVIDER_SITE_OTHER): Payer: PPO | Admitting: Nurse Practitioner

## 2017-04-25 ENCOUNTER — Other Ambulatory Visit: Payer: PPO

## 2017-04-25 ENCOUNTER — Other Ambulatory Visit: Payer: Self-pay

## 2017-04-25 VITALS — BP 110/57 | HR 86 | Temp 97.6°F | Resp 20 | Ht 62.0 in | Wt 178.6 lb

## 2017-04-25 DIAGNOSIS — R7989 Other specified abnormal findings of blood chemistry: Secondary | ICD-10-CM

## 2017-04-25 DIAGNOSIS — J440 Chronic obstructive pulmonary disease with acute lower respiratory infection: Secondary | ICD-10-CM

## 2017-04-25 DIAGNOSIS — J209 Acute bronchitis, unspecified: Secondary | ICD-10-CM

## 2017-04-25 DIAGNOSIS — E039 Hypothyroidism, unspecified: Secondary | ICD-10-CM | POA: Diagnosis not present

## 2017-04-25 MED ORDER — BENZONATATE 100 MG PO CAPS: 100.0000 mg | ORAL_CAPSULE | Freq: Two times a day (BID) | ORAL | 0 refills | Status: DC | PRN

## 2017-04-25 MED ORDER — PREDNISONE 50 MG PO TABS: 50.0000 mg | ORAL_TABLET | Freq: Every day | ORAL | 0 refills | Status: AC

## 2017-04-25 NOTE — Progress Notes (Signed)
Subjective:    Patient ID: [name], female    DOB: 06/12/1950, 67 y.o.   MRN: [id number]  [name] is a 67 y.o. female presenting on 04/25/2017 for URI (hoarseness, cough, chest congestion and eye irritation in the left eye x 7 days )   HPI  URI Patient presents today with URI symptoms that began 7 days ago.  Symptoms include hoarseness, cough, chest congestion, mild nasal congestion, eye irritation yesterday with increased tear production.  Patient reports that she has had pneumonia 4 times in the last several years and does not want this to be pneumonia today.  She ran a low-grade fever of 99.8 from symptom onset Thursday through Sunday. - She continues to smoke 1 pack/day. -She has COPD and currently takes Spiriva with albuterol as needed.  She has used albuterol several times a day for the last couple of days. -She denies ear pressure, pain, fullness, sinus pressure, sinus congestion, nausea, vomiting, diarrhea, constipation. -She has not taken any other OTC meds for her symptoms.  She reports Mucinex makes her have a fast heart rate.   Social History   Tobacco Use  . Smoking status: Current Every Day Smoker    Packs/day: 1.00    Years: 35.00    Pack years: 35.00    Types: Cigarettes  . Smokeless tobacco: Never Used  . Tobacco comment: Previously smoked 2 ppd  Substance Use Topics  . Alcohol use: No  . Drug use: No    Review of Systems Per HPI unless specifically indicated above     Objective:    BP (!) 110/57 (BP Location: Left Arm, Patient Position: Sitting, Cuff Size: Normal)   Pulse 86   Temp 97.6 F (36.4 C) (Oral)   Resp 20   Ht 5\' 2"  (1.575 m)   Wt 178 lb 9.6 oz (81 kg)   SpO2 95%   BMI 32.67 kg/m   Wt Readings from Last 3 Encounters:  04/25/17 178 lb 9.6 oz (81 kg)  04/11/17 180 lb (81.6 kg)  03/14/17 177 lb 3.2 oz (80.4 kg)    Physical Exam  Constitutional: She is oriented to person, place, and time. She appears well-developed and  well-nourished. She appears distressed (mildly).  HENT:  Head: Normocephalic and atraumatic.  Right Ear: Hearing, tympanic membrane, external ear and ear canal normal.  Left Ear: Hearing, tympanic membrane, external ear and ear canal normal.  Nose: No mucosal edema or rhinorrhea. Right sinus exhibits no maxillary sinus tenderness and no frontal sinus tenderness. Left sinus exhibits no maxillary sinus tenderness and no frontal sinus tenderness.  Mouth/Throat: Uvula is midline and mucous membranes are normal. Posterior oropharyngeal edema (cobblestoning) and posterior oropharyngeal erythema present. No oropharyngeal exudate (clear secretions) or tonsillar abscesses.  Eyes: Pupils are equal, round, and reactive to light. Conjunctivae and EOM are normal. Right eye exhibits no discharge. Left eye exhibits no discharge.  Neck: Normal range of motion. Neck supple.  Cardiovascular: Normal rate, regular rhythm, S1 normal, S2 normal, normal heart sounds and intact distal pulses.  Pulmonary/Chest: No accessory muscle usage. No respiratory distress. She has decreased breath sounds. She has no wheezes. She has rales in the right upper field, the right middle field, the right lower field, the left upper field, the left middle field and the left lower field.  Lymphadenopathy:    She has cervical adenopathy.  Neurological: She is alert and oriented to person, place, and time.  Skin: Skin is warm and dry.  Psychiatric: She has a normal mood and affect. Her behavior is normal. Judgment and thought content normal.  Vitals reviewed.    Results for orders placed or performed in visit on 56/38/75  BASIC METABOLIC PANEL WITH GFR  Result Value Ref Range   Glucose, Bld 81 65 - 99 mg/dL   BUN 16 7 - 25 mg/dL   Creat 0.91 0.50 - 0.99 mg/dL   GFR, Est Non African American 65 > OR = 60 mL/min/1.33m2   GFR, Est African American 75 > OR = 60 mL/min/1.61m2   BUN/Creatinine Ratio NOT APPLICABLE 6 - 22 (calc)   Sodium 139  135 - 146 mmol/L   Potassium 4.7 3.5 - 5.3 mmol/L   Chloride 101 98 - 110 mmol/L   CO2 35 (H) 20 - 32 mmol/L   Calcium 9.4 8.6 - 10.4 mg/dL  TSH + free T4  Result Value Ref Range   TSH W/REFLEX TO FT4 4.23 0.40 - 4.50 mIU/L      Assessment & Plan:   Problem List Items Addressed This Visit    None    Visit Diagnoses    Acute bronchitis with COPD ([place])    -  Primary Consistent with mild acute exacerbation of COPD with worsening productive cough after URI. - No hypoxia, afebrile, no recent hospitalization - Continues Albuterol, Spiriva  Plan: 1. Start Prednisone 50mg  x 5 day steroid burst 2. Use albuterol q 4 hr regularly x 2-3 days. Continue maintenance inhalers 3. Considered antibiotics, however afebrile and no hypoxia, would not be indicated in mild AECOPD, unless recurrent or not improved on steroids 4. RTC about 1 week if not improving, otherwise strict return criteria to go to ED    Relevant Medications   predniSONE (DELTASONE) 50 MG tablet   benzonatate (TESSALON) 100 MG capsule   Elevated serum creatinine     Recheck CMP for following trends.  Likely is single elevation that needs to be repeated.  Could be progression to CKD.      Acquired hypothyroidism     Pt with need for recheck of TSH to continue adjusting levothyroxine.    Recheck TSH today.  Adjust levothyroxine as indicated after results.      Meds ordered this encounter  Medications  . predniSONE (DELTASONE) 50 MG tablet    Sig: Take 1 tablet (50 mg total) by mouth daily with breakfast for 5 days.    Dispense:  5 tablet    Refill:  0    Order Specific Question:   Supervising Provider    Answer:   Olin Hauser [[id number]]  . benzonatate (TESSALON) 100 MG capsule    Sig: Take 1-2 capsules (100-200 mg total) by mouth 2 (two) times daily as needed for cough.    Dispense:  30 capsule    Refill:  0    Order Specific Question:   Supervising Provider    Answer:   Olin Hauser [[id number]]       Follow up plan: Return 3-5 days if symptoms worsen or fail to improve AND as scheduled the week of 4/9.  Cassell Smiles, DNP, AGPCNP-BC Adult Gerontology Primary Care Nurse Practitioner [facility] 04/28/2017, 9:59 PM

## 2017-04-25 NOTE — Patient Instructions (Addendum)
[name], Thank you for coming in to clinic today.  1. START taking prednisone 50 mg tablet.  Take 1 tablet in morning for 5 days and STOP. - START benzonatate 100-200 mg twice daily as needed for cough  It sounds like you have a Upper Respiratory Virus and viral bronchitis this will most likely run it's course in 7 to 10 days. Recommend good hand washing. - Continue using albuterol regularly about every 6 hours for the next 2-3 days.   - CONTINUE Spiriva. - Drink plenty of fluids to improve congestion - You may try over the counter Nasal Saline spray (Simply Saline, Ocean Spray) as needed to reduce congestion. - Drink warm herbal tea with honey for sore throat. - Start taking Tylenol extra strength 1 to 2 tablets every 6-8 hours for aches or fever/chills for next few days as needed.  Do not take more than 3,000 mg in 24 hours from all medicines.  May take Ibuprofen as well if tolerated 200-400mg  every 8 hours as needed.  If symptoms significantly worsening with persistent fevers/chills despite tylenol/ibpurofen, nausea, vomiting unable to tolerate food/fluids or medicine, body aches, or shortness of breath, sinus pain pressure or worsening productive cough, then follow-up for re-evaluation, may seek more immediate care at Urgent Care or ED if more concerned for emergency.  Please schedule a follow-up appointment with Cassell Smiles, AGNP. Return 3-5 days if symptoms worsen or fail to improve AND as scheduled the week of 4/9.  If you have any other questions or concerns, please feel free to call the clinic or send a message through [place]. You may also schedule an earlier appointment if necessary.  You will receive a survey after today's visit either digitally by e-mail or paper by [employer]. Your experiences and feedback matter to [name].  Please respond so we know how we are doing as we provide care for you.   Cassell Smiles, DNP, AGNP-BC Adult Gerontology Nurse Practitioner [place]

## 2017-04-26 ENCOUNTER — Telehealth: Payer: Self-pay | Admitting: Nurse Practitioner

## 2017-04-26 DIAGNOSIS — J440 Chronic obstructive pulmonary disease with acute lower respiratory infection: Principal | ICD-10-CM

## 2017-04-26 DIAGNOSIS — J209 Acute bronchitis, unspecified: Secondary | ICD-10-CM

## 2017-04-26 LAB — BASIC METABOLIC PANEL WITH GFR
BUN: 16 mg/dL (ref 7–25)
CO2: 35 mmol/L — ABNORMAL HIGH (ref 20–32)
Calcium: 9.4 mg/dL (ref 8.6–10.4)
Chloride: 101 mmol/L (ref 98–110)
Creat: 0.91 mg/dL (ref 0.50–0.99)
GFR, Est African American: 75 mL/min/{1.73_m2} (ref >=60)
GFR, Est Non African American: 65 mL/min/{1.73_m2} (ref >=60)
Glucose, Bld: 81 mg/dL (ref 65–99)
Potassium: 4.7 mmol/L (ref 3.5–5.3)
Sodium: 139 mmol/L (ref 135–146)

## 2017-04-26 LAB — TSH+FREE T4: TSH W/REFLEX TO FT4: 4.23 mIU/L (ref 0.40–4.50)

## 2017-04-26 MED ORDER — HYDROCOD POLST-CPM POLST ER 10-8 MG/5ML PO SUER: 5.0000 mL | Freq: Every evening | ORAL | 0 refills | Status: AC | PRN

## 2017-04-26 NOTE — Telephone Encounter (Signed)
The pt was notified. 

## 2017-04-26 NOTE — Telephone Encounter (Signed)
Pt was in yesterday and a prescription was sent for cough pills.  She had a few left at home but they are not helping.  She asked for cough syrup instead.  Her call back number is [phone]

## 2017-04-26 NOTE — Telephone Encounter (Signed)
I have sent Tussionex.  Take 5 mL at bedtime for 7 nights as needed for cough.

## 2017-04-28 ENCOUNTER — Encounter: Payer: Self-pay | Admitting: Nurse Practitioner

## 2017-05-05 ENCOUNTER — Encounter: Payer: Self-pay | Admitting: Nurse Practitioner

## 2017-05-09 ENCOUNTER — Ambulatory Visit: Payer: Self-pay | Admitting: Nurse Practitioner

## 2017-05-14 ENCOUNTER — Ambulatory Visit: Payer: PPO | Admitting: Nurse Practitioner

## 2017-05-14 ENCOUNTER — Encounter: Payer: Self-pay | Admitting: Nurse Practitioner

## 2017-05-14 ENCOUNTER — Other Ambulatory Visit: Payer: Self-pay

## 2017-05-14 ENCOUNTER — Ambulatory Visit (INDEPENDENT_AMBULATORY_CARE_PROVIDER_SITE_OTHER): Payer: PPO

## 2017-05-14 VITALS — BP 131/73 | HR 100 | Temp 98.8°F | Resp 20 | Ht 62.0 in | Wt 179.0 lb

## 2017-05-14 DIAGNOSIS — Z0000 Encounter for general adult medical examination without abnormal findings: Secondary | ICD-10-CM | POA: Diagnosis not present

## 2017-05-14 DIAGNOSIS — Z23 Encounter for immunization: Secondary | ICD-10-CM | POA: Diagnosis not present

## 2017-05-14 DIAGNOSIS — F410 Panic disorder [episodic paroxysmal anxiety] without agoraphobia: Secondary | ICD-10-CM

## 2017-05-14 DIAGNOSIS — J449 Chronic obstructive pulmonary disease, unspecified: Secondary | ICD-10-CM | POA: Diagnosis not present

## 2017-05-14 DIAGNOSIS — Z1159 Encounter for screening for other viral diseases: Secondary | ICD-10-CM | POA: Diagnosis not present

## 2017-05-14 MED ORDER — BUDESONIDE-FORMOTEROL FUMARATE 80-4.5 MCG/ACT IN AERO: 2.0000 | INHALATION_SPRAY | Freq: Two times a day (BID) | RESPIRATORY_TRACT | 12 refills | Status: DC

## 2017-05-14 MED ORDER — BUSPIRONE HCL 7.5 MG PO TABS: 7.5000 mg | ORAL_TABLET | Freq: Three times a day (TID) | ORAL | 5 refills | Status: DC

## 2017-05-14 MED ORDER — CLONAZEPAM 1 MG PO TABS: ORAL_TABLET | ORAL | 1 refills | Status: DC

## 2017-05-14 NOTE — Patient Instructions (Addendum)
[name] "[name]",   Thank you for coming in to clinic today.  1. Continue clonazepam 1 mg twice daily.  Take about every 12 hours for the best coverage.  2. Work to stop gabapentin: Take 1 pill (300 mg) twice daily for 1 week. Take 1 pill (300 mg) at bedtime for 1 week, then STOP.  3. START buspirone 7.5 mg tablet. Take 1 pill once daily for 1 week. Take 1 pill TWICE daily and continue.   For breathing: Start Symbicort two puffs twice daily.  Rinse mouth after using.  Please schedule a follow-up appointment with Cassell Smiles, AGNP. Return in about 6 weeks (around 06/25/2017) for anxiety.  If you have any other questions or concerns, please feel free to call the clinic or send a message through [place]. You may also schedule an earlier appointment if necessary.  You will receive a survey after today's visit either digitally by e-mail or paper by [employer]. Your experiences and feedback matter to [name].  Please respond so we know how we are doing as we provide care for you.   Cassell Smiles, DNP, AGNP-BC Adult Gerontology Nurse Practitioner [place]

## 2017-05-14 NOTE — Assessment & Plan Note (Signed)
Currently not well controlled with pt who is having chronic smoker's cough in absence of identifiable focal infection and no systemic signs or symptoms of infection.  Patient is currently only taking albuterol as needed and Spiriva.  Patient continues to smoke 1 pack/day and has a 40-pack-year or more smoking history.  Plan: 1.  Continue albuterol and Spiriva without change. 2.  Start Symbicort 80-4.5 MCG/ACT inhaler.  Take 2 puffs twice daily 3.  Encouraged patient to continue reduction of smoking 4.  Follow-up 3 months.

## 2017-05-14 NOTE — Assessment & Plan Note (Signed)
Pt with chronic anxiety and is currently partially controlled.  Patient states she feels better on higher dose of citalopram, but still would desire better anxiety coverage.  Plan: 1.  Continue citalopram to 40 mg once daily 2.  Continue clonazepam 1 mg twice daily as needed anxiety.  Consider future taper of clonazepam once buspirone at target dose. 3.  Start buspirone 7.5 mg twice daily.  Take 1 tablet once daily for 1 week before increasing to twice daily. -Stop gabapentin.  Take 1 tablet twice daily for 1 week.  Then, take 1 tablet once daily for 1 week.  Then, stop. 4.  Encouraged patient to continue family and social support for stress management.  Encouraged also use of spiritual support system. 5.  Followup 6 weeks assess medication.

## 2017-05-14 NOTE — Patient Instructions (Addendum)
[name] , Thank you for taking time to come for your Medicare Wellness Visit. I appreciate your ongoing commitment to your health goals. Please review the following plan we discussed and let me know if I can assist you in the future.   Screening recommendations/referrals: Colonoscopy: declined- cologuard information provided in todays visit  Mammogram: up to date Bone Density: up to date  Recommended yearly ophthalmology/optometry visit for glaucoma screening and checkup Recommended yearly dental visit for hygiene and checkup  Vaccinations: Influenza vaccine: up to date  Pneumococcal vaccine: prevnar 13 done today, pneumovax 23 due in 8-10 weeks. Recommend repeat every 5 years.  Tdap vaccine: up to date  Shingles vaccine: check with your dermatologist on recommendations   Advanced directives: Advance directive discussed with you today. I have provided a copy for you to complete at home and have notarized. Once this is complete please bring a copy in to our office so we can scan it into your chart.  Conditions/risks identified: zmoking cessation discussed- declined lung cancer screening at this time.   Next appointment: Follow up in one year for your annual wellness exam.    Preventive Care 65 Years and Older, Female Preventive care refers to lifestyle choices and visits with your health care provider that can promote health and wellness. What does preventive care include?  A yearly physical exam. This is also called an annual well check.  Dental exams once or twice a year.  Routine eye exams. Ask your health care provider how often you should have your eyes checked.  Personal lifestyle choices, including:  Daily care of your teeth and gums.  Regular physical activity.  Eating a healthy diet.  Avoiding tobacco and drug use.  Limiting alcohol use.  Practicing safe sex.  Taking low-dose aspirin every day.  Taking vitamin and mineral supplements as recommended by your  health care provider. What happens during an annual well check? The services and screenings done by your health care provider during your annual well check will depend on your age, overall health, lifestyle risk factors, and family history of disease. Counseling  Your health care provider may ask you questions about your:  Alcohol use.  Tobacco use.  Drug use.  Emotional well-being.  Home and relationship well-being.  Sexual activity.  Eating habits.  History of falls.  Memory and ability to understand (cognition).  Work and work Statistician.  Reproductive health. Screening  You may have the following tests or measurements:  Height, weight, and BMI.  Blood pressure.  Lipid and cholesterol levels. These may be checked every 5 years, or more frequently if you are over 52 years old.  Skin check.  Lung cancer screening. You may have this screening every year starting at age 25 if you have a 30-pack-year history of smoking and currently smoke or have quit within the past 15 years.  Fecal occult blood test (FOBT) of the stool. You may have this test every year starting at age 58.  Flexible sigmoidoscopy or colonoscopy. You may have a sigmoidoscopy every 5 years or a colonoscopy every 10 years starting at age 56.  Hepatitis C blood test.  Hepatitis B blood test.  Sexually transmitted disease (STD) testing.  Diabetes screening. This is done by checking your blood sugar (glucose) after you have not eaten for a while (fasting). You may have this done every 1-3 years.  Bone density scan. This is done to screen for osteoporosis. You may have this done starting at age 48.  Mammogram.  This may be done every 1-2 years. Talk to your health care provider about how often you should have regular mammograms. Talk with your health care provider about your test results, treatment options, and if necessary, the need for more tests. Vaccines  Your health care provider may recommend  certain vaccines, such as:  Influenza vaccine. This is recommended every year.  Tetanus, diphtheria, and acellular pertussis (Tdap, Td) vaccine. You may need a Td booster every 10 years.  Zoster vaccine. You may need this after age 38.  Pneumococcal 13-valent conjugate (PCV13) vaccine. One dose is recommended after age 12.  Pneumococcal polysaccharide (PPSV23) vaccine. One dose is recommended after age 72. Talk to your health care provider about which screenings and vaccines you need and how often you need them. This information is not intended to replace advice given to you by your health care provider. Make sure you discuss any questions you have with your health care provider. Document Released: 02/18/2015 Document Revised: 10/12/2015 Document Reviewed: 11/23/2014 Elsevier Interactive Patient Education  2017 [place] Prevention in the Home Falls can cause injuries. They can happen to people of all ages. There are many things you can do to make your home safe and to help prevent falls. What can I do on the outside of my home?  Regularly fix the edges of walkways and driveways and fix any cracks.  Remove anything that might make you trip as you walk through a door, such as a raised step or threshold.  Trim any bushes or trees on the path to your home.  Use bright outdoor lighting.  Clear any walking paths of anything that might make someone trip, such as rocks or tools.  Regularly check to see if handrails are loose or broken. Make sure that both sides of any steps have handrails.  Any raised decks and porches should have guardrails on the edges.  Have any leaves, snow, or ice cleared regularly.  Use sand or salt on walking paths during winter.  Clean up any spills in your garage right away. This includes oil or grease spills. What can I do in the bathroom?  Use night lights.  Install grab bars by the toilet and in the tub and shower. Do not use towel bars as  grab bars.  Use non-skid mats or decals in the tub or shower.  If you need to sit down in the shower, use a plastic, non-slip stool.  Keep the floor dry. Clean up any water that spills on the floor as soon as it happens.  Remove soap buildup in the tub or shower regularly.  Attach bath mats securely with double-sided non-slip rug tape.  Do not have throw rugs and other things on the floor that can make you trip. What can I do in the bedroom?  Use night lights.  Make sure that you have a light by your bed that is easy to reach.  Do not use any sheets or blankets that are too big for your bed. They should not hang down onto the floor.  Have a firm chair that has side arms. You can use this for support while you get dressed.  Do not have throw rugs and other things on the floor that can make you trip. What can I do in the kitchen?  Clean up any spills right away.  Avoid walking on wet floors.  Keep items that you use a lot in easy-to-reach places.  If you need to reach something  above you, use a strong step stool that has a grab bar.  Keep electrical cords out of the way.  Do not use floor polish or wax that makes floors slippery. If you must use wax, use non-skid floor wax.  Do not have throw rugs and other things on the floor that can make you trip. What can I do with my stairs?  Do not leave any items on the stairs.  Make sure that there are handrails on both sides of the stairs and use them. Fix handrails that are broken or loose. Make sure that handrails are as long as the stairways.  Check any carpeting to make sure that it is firmly attached to the stairs. Fix any carpet that is loose or worn.  Avoid having throw rugs at the top or bottom of the stairs. If you do have throw rugs, attach them to the floor with carpet tape.  Make sure that you have a light switch at the top of the stairs and the bottom of the stairs. If you do not have them, ask someone to add them  for you. What else can I do to help prevent falls?  Wear shoes that:  Do not have high heels.  Have rubber bottoms.  Are comfortable and fit you well.  Are closed at the toe. Do not wear sandals.  If you use a stepladder:  Make sure that it is fully opened. Do not climb a closed stepladder.  Make sure that both sides of the stepladder are locked into place.  Ask someone to hold it for you, if possible.  Clearly mark and make sure that you can see:  Any grab bars or handrails.  First and last steps.  Where the edge of each step is.  Use tools that help you move around (mobility aids) if they are needed. These include:  Canes.  Walkers.  Scooters.  Crutches.  Turn on the lights when you go into a dark area. Replace any light bulbs as soon as they burn out.  Set up your furniture so you have a clear path. Avoid moving your furniture around.  If any of your floors are uneven, fix them.  If there are any pets around you, be aware of where they are.  Review your medicines with your doctor. Some medicines can make you feel dizzy. This can increase your chance of falling. Ask your doctor what other things that you can do to help prevent falls. This information is not intended to replace advice given to you by your health care provider. Make sure you discuss any questions you have with your health care provider. Document Released: 11/18/2008 Document Revised: 06/30/2015 Document Reviewed: 02/26/2014 Elsevier Interactive Patient Education  2017 [employer]  Pneumococcal Conjugate Vaccine suspension for injection What is this medicine? PNEUMOCOCCAL VACCINE (NEU mo KOK al vak SEEN) is a vaccine used to prevent pneumococcus bacterial infections. These bacteria can cause serious infections like pneumonia, meningitis, and blood infections. This vaccine will lower your chance of getting pneumonia. If you do get pneumonia, it can make your symptoms milder and your illness  shorter. This vaccine will not treat an infection and will not cause infection. This vaccine is recommended for infants and young children, adults with certain medical conditions, and adults 95 years or older. This medicine may be used for other purposes; ask your health care provider or pharmacist if you have questions. COMMON BRAND NAME(S): Prevnar, Prevnar 13 What should I tell my health care  provider before I take this medicine? They need to know if you have any of these conditions: -bleeding problems -fever -immune system problems -an unusual or allergic reaction to pneumococcal vaccine, diphtheria toxoid, other vaccines, latex, other medicines, foods, dyes, or preservatives -pregnant or trying to get pregnant -breast-feeding How should I use this medicine? This vaccine is for injection into a muscle. It is given by a health care professional. A copy of Vaccine Information Statements will be given before each vaccination. Read this sheet carefully each time. The sheet may change frequently. Talk to your pediatrician regarding the use of this medicine in children. While this drug may be prescribed for children as young as 83 weeks old for selected conditions, precautions do apply. Overdosage: If you think you have taken too much of this medicine contact a poison control center or emergency room at once. NOTE: This medicine is only for you. Do not share this medicine with others. What if I miss a dose? It is important not to miss your dose. Call your doctor or health care professional if you are unable to keep an appointment. What may interact with this medicine? -medicines for cancer chemotherapy -medicines that suppress your immune function -steroid medicines like prednisone or cortisone This list may not describe all possible interactions. Give your health care provider a list of all the medicines, herbs, non-prescription drugs, or dietary supplements you use. Also tell them if you smoke,  drink alcohol, or use illegal drugs. Some items may interact with your medicine. What should I watch for while using this medicine? Mild fever and pain should go away in 3 days or less. Report any unusual symptoms to your doctor or health care professional. What side effects may I notice from receiving this medicine? Side effects that you should report to your doctor or health care professional as soon as possible: -allergic reactions like skin rash, itching or hives, swelling of the face, lips, or tongue -breathing problems -confused -fast or irregular heartbeat -fever over 102 degrees F -seizures -unusual bleeding or bruising -unusual muscle weakness Side effects that usually do not require medical attention (report to your doctor or health care professional if they continue or are bothersome): -aches and pains -diarrhea -fever of 102 degrees F or less -headache -irritable -loss of appetite -pain, tender at site where injected -trouble sleeping This list may not describe all possible side effects. Call your doctor for medical advice about side effects. You may report side effects to FDA at 1-800-FDA-1088. Where should I keep my medicine? This does not apply. This vaccine is given in a clinic, pharmacy, doctor's office, or other health care setting and will not be stored at home. NOTE: This sheet is a summary. It may not cover all possible information. If you have questions about this medicine, talk to your doctor, pharmacist, or health care provider.  2018 Elsevier/Gold Standard (2013-10-29 10:27:27)

## 2017-05-14 NOTE — Progress Notes (Signed)
Subjective:    Patient ID: [name], female    DOB: 11/29/1950, 67 y.o.   MRN: [id number]  [name] is a 67 y.o. female presenting on 05/14/2017 for Anxiety (pt currently got some family issues making anxiety worse ) and Cough (worsen at night x 2 weeks. pt states she's coughing up yellowish mucus)   HPI Anxiety Pt is taking citalopram 40 mg once daily (max dose) and clonazepam 1 mg bid.  She notes difficulty with clonazepam wearing off by the evening.  She takes first dose around 6 AM and nighttime dose at bedtime around 11 PM.  Currently has increased life stressors with terminally ill brother and concerns for a grandson living a lifestyle she does not approve of.  Gabapentin has helped with clonazepam taper, but is not improving anxiety.  GAD 7 : Generalized Anxiety Score 05/14/2017 03/14/2017  Nervous, Anxious, on Edge 3 2  Control/stop worrying 2 2  Worry too much - different things 3 2  Trouble relaxing 2 1  Restless 0 0  Easily annoyed or irritable 0 0  Afraid - awful might happen 0 0  Total GAD 7 Score 10 7  Anxiety Difficulty Not difficult at all Not difficult at all     Depression screen [facility] 2/9 05/14/2017 05/14/2017 04/11/2017 03/14/2017 11/06/2016  Decreased Interest 2 2 1 1  0  Down, Depressed, Hopeless 2 3 1 1  0  PHQ - 2 Score 4 5 2 2  0  Altered sleeping 3 3 0 1 -  Tired, decreased energy 2 3 - 0 -  Change in appetite 0 0 1 0 -  Feeling bad or failure about yourself  0 0 0 0 -  Trouble concentrating 0 0 1 0 -  Moving slowly or fidgety/restless 0 0 0 0 -  Suicidal thoughts 0 0 0 0 -  PHQ-9 Score 9 11 4 3  -  Difficult doing work/chores Not difficult at all Not difficult at all Not difficult at all Not difficult at all -  Some recent data might be hidden    COPD/Cough  Patient presents today with chronic cough over the last 3-4 months.  She had URI with symptoms and was treated in March 2019.  Cough is persistent.  Patient now has productive cough with yellow mucus.   Occasionally, patient is awoken at night from the cough. -Pt denies fever, chills, sweats, nausea, vomiting, diarrhea and constipation.  -Patient takes albuterol as needed and Spiriva once daily for her COPD. -Patient continues to smoke, but has reduced to 1 pack/day from 2 packs/day approximately 6 months ago.  Social History   Tobacco Use  . Smoking status: Current Every Day Smoker    Packs/day: 1.00    Years: 35.00    Pack years: 35.00    Types: Cigarettes  . Smokeless tobacco: Never Used  . Tobacco comment: Previously smoked 2 ppd  Substance Use Topics  . Alcohol use: No  . Drug use: No    Review of Systems Per HPI unless specifically indicated above     Objective:    BP 131/73 (BP Location: Right Arm, Patient Position: Sitting, Cuff Size: Normal)   Pulse 100   Temp 98.8 F (37.1 C) (Oral)   Resp 20   Ht 5\' 2"  (1.575 m)   Wt 179 lb (81.2 kg)   SpO2 94%   BMI 32.74 kg/m   Wt Readings from Last 3 Encounters:  05/14/17 179 lb (81.2 kg)  05/14/17 179 lb (  81.2 kg)  04/25/17 178 lb 9.6 oz (81 kg)    Physical Exam  Constitutional: She is oriented to person, place, and time. Vital signs are normal. She appears well-developed and well-nourished. No distress.  HENT:  Head: Normocephalic and atraumatic.  Right Ear: Hearing normal.  Left Ear: Hearing normal.  Eyes: Pupils are equal, round, and reactive to light. Conjunctivae and EOM are normal.  Neck: Normal range of motion and phonation normal. Neck supple. Carotid bruit is not present. No thyroid mass and no thyromegaly present.  Cardiovascular: Normal rate, regular rhythm, S1 normal, S2 normal, normal heart sounds and intact distal pulses.  Pulmonary/Chest: Effort normal. No respiratory distress. She has decreased breath sounds (throughout lobes). She has no wheezes. She has no rhonchi. She has no rales.  Musculoskeletal: She exhibits no edema (pedal).  Lymphadenopathy:    She has no cervical adenopathy.    Neurological: She is alert and oriented to person, place, and time.  Skin: Skin is warm, dry and intact. No ecchymosis and no laceration noted.  Psychiatric: Her speech is normal and behavior is normal. Judgment and thought content normal. Her mood appears anxious. Cognition and memory are normal.  Vitals reviewed.   Results for orders placed or performed in visit on 09/38/18  BASIC METABOLIC PANEL WITH GFR  Result Value Ref Range   Glucose, Bld 81 65 - 99 mg/dL   BUN 16 7 - 25 mg/dL   Creat 0.91 0.50 - 0.99 mg/dL   GFR, Est Non African American 65 > OR = 60 mL/min/1.27m2   GFR, Est African American 75 > OR = 60 mL/min/1.49m2   BUN/Creatinine Ratio NOT APPLICABLE 6 - 22 (calc)   Sodium 139 135 - 146 mmol/L   Potassium 4.7 3.5 - 5.3 mmol/L   Chloride 101 98 - 110 mmol/L   CO2 35 (H) 20 - 32 mmol/L   Calcium 9.4 8.6 - 10.4 mg/dL  TSH + free T4  Result Value Ref Range   TSH W/REFLEX TO FT4 4.23 0.40 - 4.50 mIU/L      Assessment & Plan:   Problem List Items Addressed This Visit      Respiratory   COPD (chronic obstructive pulmonary disease) with chronic bronchitis (HCC) - Primary (Chronic)    Currently not well controlled with pt who is having chronic smoker's cough in absence of identifiable focal infection and no systemic signs or symptoms of infection.  Patient is currently only taking albuterol as needed and Spiriva.  Patient continues to smoke 1 pack/day and has a 40-pack-year or more smoking history.  Plan: 1.  Continue albuterol and Spiriva without change. 2.  Start Symbicort 80-4.5 MCG/ACT inhaler.  Take 2 puffs twice daily 3.  Encouraged patient to continue reduction of smoking 4.  Follow-up 3 months.      Relevant Medications   budesonide-formoterol (SYMBICORT) 80-4.5 MCG/ACT inhaler     Other   Anxiety disorder (Chronic)    Pt with chronic anxiety and is currently partially controlled.  Patient states she feels better on higher dose of citalopram, but still would  desire better anxiety coverage.  Plan: 1.  Continue citalopram to 40 mg once daily 2.  Continue clonazepam 1 mg twice daily as needed anxiety.  Consider future taper of clonazepam once buspirone at target dose. 3.  Start buspirone 7.5 mg twice daily.  Take 1 tablet once daily for 1 week before increasing to twice daily. -Stop gabapentin.  Take 1 tablet twice daily for 1 week.  Then, take 1 tablet once daily for 1 week.  Then, stop. 4.  Encouraged patient to continue family and social support for stress management.  Encouraged also use of spiritual support system. 5.  Followup 6 weeks assess medication.        Relevant Medications   busPIRone (BUSPAR) 7.5 MG tablet   clonazePAM (KLONOPIN) 1 MG tablet      Meds ordered this encounter  Medications  . budesonide-formoterol (SYMBICORT) 80-4.5 MCG/ACT inhaler    Sig: Inhale 2 puffs into the lungs 2 (two) times daily.    Dispense:  1 Inhaler    Refill:  12    Order Specific Question:   Supervising Provider    Answer:   Olin Hauser [[id number]]  . busPIRone (BUSPAR) 7.5 MG tablet    Sig: Take 1 tablet (7.5 mg total) by mouth 3 (three) times daily.    Dispense:  60 tablet    Refill:  5    Order Specific Question:   Supervising Provider    Answer:   Olin Hauser [[id number]]  . clonazePAM (KLONOPIN) 1 MG tablet    Sig: Take 1 tablet (1mg  total) in morning. Take 1 tablet (1mg  total) at bedtime.    Dispense:  60 tablet    Refill:  1    Order Specific Question:   Supervising Provider    Answer:   Olin Hauser [[id number]]    Follow up plan: Return in about 6 weeks (around 06/25/2017) for anxiety.  Cassell Smiles, DNP, AGPCNP-BC Adult Gerontology Primary Care Nurse Practitioner [facility] 05/14/2017, 3:09 PM

## 2017-05-14 NOTE — Progress Notes (Signed)
Subjective:   [name] is a 67 y.o. female who presents for Medicare Annual (Subsequent) preventive examination.  Review of Systems:  Cardiac Risk Factors include: hypertension;dyslipidemia;advanced age (>82men, >40 women);smoking/ tobacco exposure;obesity (BMI >30kg/m2)     Objective:     Vitals: BP 131/73 (BP Location: Right Arm, Patient Position: Sitting)   Pulse 100   Temp 98.8 F (37.1 C) (Oral)   Resp 20   Ht 5\' 2"  (1.575 m)   Wt 179 lb (81.2 kg)   BMI 32.74 kg/m   Body mass index is 32.74 kg/m.  Advanced Directives 05/14/2017 02/12/2017 11/20/2016 10/10/2016 09/07/2016 08/07/2016 07/09/2016  Does Patient Have a Medical Advance Directive? Yes No No No No No No  Does patient want to make changes to medical advance directive? Yes (MAU/Ambulatory/Procedural Areas - Information given) - - - - - -  Would patient like information on creating a medical advance directive? - No - Patient declined - - - - -    Tobacco Social History   Tobacco Use  Smoking Status Current Every Day Smoker  . Packs/day: 1.00  . Years: 35.00  . Pack years: 35.00  . Types: Cigarettes  Smokeless Tobacco Never Used  Tobacco Comment   Previously smoked 2 ppd     Ready to quit: Yes Counseling given: Yes Comment: Previously smoked 2 ppd   Clinical Intake:  Pre-visit preparation completed: Yes  Pain : 0-10 Pain Score: 10-Worst pain ever Pain Type: Chronic pain Pain Location: Hip Pain Orientation: Right Pain Descriptors / Indicators: Aching Pain Onset: More than a month ago Pain Frequency: Constant     Nutritional Status: BMI > 30  Obese Nutritional Risks: None Diabetes: No  How often do you need to have someone help you when you read instructions, pamphlets, or other written materials from your doctor or pharmacy?: 1 - Never What is the last grade level 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 Anxiety   . Arthritis   . Chronic back pain   . Depression   . GERD (gastroesophageal reflux disease)   . Hypertension   . Hypothyroidism   . Pneumonia    RECENT X 3  . PONV (postoperative nausea and vomiting)   . Psoriasis   . Thyroid disease    Past Surgical History:  Procedure Laterality Date  . ABDOMINAL HYSTERECTOMY     heavy bleeding  . bladder stimulator    . CARPAL TUNNEL RELEASE    . CATARACT EXTRACTION W/PHACO Right 03/07/2015   Procedure: CATARACT EXTRACTION PHACO AND INTRAOCULAR LENS PLACEMENT (IOC);  Surgeon: Estill Cotta, MD;  Location: [facility] ORS;  Service: Ophthalmology;  Laterality: Right;  [name] 01:14 AP% 26.4 CDE 33.04 fluid pack lot # [id number]  . CATARACT EXTRACTION W/PHACO Left 03/21/2015   Procedure: CATARACT EXTRACTION PHACO AND INTRAOCULAR LENS PLACEMENT (IOC);  Surgeon: Estill Cotta, MD;  Location: [facility] ORS;  Service: Ophthalmology;  Laterality: Left;  [name] 01:21 AP% 25.6 CDE 39.78 fluid pack lot # [id number]  . HAND SURGERY Right   . INTERSTIM IMPLANT REMOVAL N/A 09/07/2015   Procedure: REMOVAL OF INTERSTIM IMPLANT;  Surgeon: Bjorn Loser, MD;  Location: [facility] ORS;  Service: Urology;  Laterality: N/A;  . OOPHORECTOMY Right    ovarian cyst   Family History  Problem Relation Age of Onset  . Heart disease Mother   . Breast cancer Mother   .  Heart attack Mother   . COPD Father   . Stroke Paternal Uncle   . Kidney disease Sister   . Breast cancer Cousin   . Breast cancer Cousin   . Heart attack Maternal Aunt   . Stroke Paternal Grandfather   . Kidney cancer Neg Hx   . Bladder Cancer Neg Hx    Social History   Socioeconomic History  . Marital status: Widowed    Spouse name: Not on file  . Number of children: Not on file  . Years of education: Not on file  . Highest education level: Not on file  Occupational History  . Occupation: retired  Scientific laboratory technician  . Financial resource strain: Not hard at all  . Food  insecurity:    Worry: Never true    Inability: Never true  . Transportation needs:    Medical: Yes    Non-medical: Yes  Tobacco Use  . Smoking status: Current Every Day Smoker    Packs/day: 1.00    Years: 35.00    Pack years: 35.00    Types: Cigarettes  . Smokeless tobacco: Never Used  . Tobacco comment: Previously smoked 2 ppd  Substance and Sexual Activity  . Alcohol use: No  . Drug use: No  . Sexual activity: Never    Birth control/protection: Surgical  Lifestyle  . Physical activity:    Days per week: 0 days    Minutes per session: 0 min  . Stress: Not at all  Relationships  . Social connections:    Talks on phone: More than three times a week    Gets together: More than three times a week    Attends religious service: More than 4 times per year    Active member of club or organization: No    Attends meetings of clubs or organizations: Never    Relationship status: Widowed  Other Topics Concern  . Not on file  Social History Narrative  . Not on file    Outpatient Encounter Medications as of 05/14/2017  Medication Sig  . acetaminophen (TYLENOL) 500 MG tablet Take 500 mg by mouth every 6 (six) hours as needed for moderate pain. Reported on 07/01/2015  . albuterol (PROVENTIL HFA;VENTOLIN HFA) 108 (90 Base) MCG/ACT inhaler Inhale 2 puffs into the lungs every 6 (six) hours as needed for wheezing or shortness of breath.  Marland Kitchen augmented betamethasone dipropionate (DIPROLENE-AF) 0.05 % cream Apply 1 application topically daily.  . budesonide-formoterol (SYMBICORT) 80-4.5 MCG/ACT inhaler Inhale 2 puffs into the lungs 2 (two) times daily.  . busPIRone (BUSPAR) 7.5 MG tablet Take 1 tablet (7.5 mg total) by mouth 3 (three) times daily.  . calcipotriene (DOVONOX) 0.005 % cream Apply 1 application topically daily.  . citalopram (CELEXA) 40 MG tablet Take 1 tablet (40 mg total) by mouth daily.  . clonazePAM (KLONOPIN) 1 MG tablet Take 1 tablet (1mg  total) in morning. Take 1 tablet (1mg   total) at bedtime.  . clotrimazole-betamethasone (LOTRISONE) cream Apply 1 application topically 2 (two) times daily.  . cyclobenzaprine (FLEXERIL) 10 MG tablet Take 10 mg by mouth 3 (three) times daily.  Marland Kitchen diltiazem (CARDIZEM CD) 120 MG 24 hr capsule TAKE ONE CAPSULE BY MOUTH EVERY DAY  . levothyroxine (SYNTHROID, LEVOTHROID) 125 MCG tablet Take 1 tablet (125 mcg total) by mouth daily with breakfast.  . lisinopril (PRINIVIL,ZESTRIL) 10 MG tablet TAKE ONE TABLET BY MOUTH EVERY DAY  . Secukinumab (COSENTYX) 150 MG/ML SOSY Inject 150 mg into the skin every 14 (fourteen)  days. 2 injections per month  . tiotropium (SPIRIVA) 18 MCG inhalation capsule Place 1 capsule (18 mcg total) into inhaler and inhale daily.  . traMADol (ULTRAM) 50 MG tablet Take 50 mg by mouth every 6 (six) hours as needed.  . triamcinolone (KENALOG) 0.025 % ointment Apply 1 application topically as directed.   . benzonatate (TESSALON) 100 MG capsule Take 1-2 capsules (100-200 mg total) by mouth 2 (two) times daily as needed for cough. (Patient not taking: Reported on 05/14/2017)  . mirabegron ER (MYRBETRIQ) 25 MG TB24 tablet Take 1 tablet (25 mg total) by mouth daily. (Patient not taking: Reported on 05/14/2017)   No facility-administered encounter medications on file as of 05/14/2017.     Activities of Daily Living In your present state of health, do you have any difficulty performing the following activities: 05/14/2017 02/04/2017  Hearing? N N  Vision? N Y  Comment - -  Difficulty concentrating or making decisions? N N  Walking or climbing stairs? Y Y  Comment hip pain  -  Dressing or bathing? N N  Doing errands, shopping? N N  Preparing Food and eating ? N -  Using the Toilet? N -  In the past six months, have you accidently leaked urine? Y -  Comment wears pads -  Do you have problems with loss of bowel control? N -  Managing your Medications? N -  Managing your Finances? N -  Housekeeping or managing your Housekeeping?  N -  Some recent data might be hidden    Patient Care Team: Mikey College, NP as PCP - General (Nurse Practitioner) Estill Cotta, MD as Consulting Physician (Ophthalmology) Jannet Mantis, MD as Consulting Physician (Dermatology) Corey Skains, MD as Consulting Physician (Cardiology)    Assessment:   This is a routine wellness examination for [place].  Exercise Activities and Dietary recommendations Current Exercise Habits: Home exercise routine, Type of exercise: walking, Time (Minutes): 30, Frequency (Times/Week): 3, Weekly Exercise (Minutes/Week): 90, Intensity: Mild, Exercise limited by: orthopedic condition(s)(hip pain )  Goals    . Quit Smoking     Smoking cessation discussed       Fall Risk Fall Risk  05/14/2017 12/06/2016 11/06/2016 10/10/2016 09/07/2016  Falls in the past year? Yes No No No No  Number falls in past yr: 1 - - - -  Injury with Fall? No - - - -  Risk for fall due to : - - - - -  Risk for fall due to: Comment - - - - -  Follow up Falls prevention discussed - - - -   Is the patient's home free of loose throw rugs in walkways, pet beds, electrical cords, etc?   yes      Grab bars in the bathroom? yes      Handrails on the stairs?   yes      Adequate lighting?   yes  Timed Get Up and Go performed: Completed in 9 seconds with no use of assistive devices, steady gait. No intervention needed at this time.   Depression Screen PHQ 2/9 Scores 05/14/2017 05/14/2017 04/11/2017 03/14/2017  PHQ - 2 Score 4 5 2 2   PHQ- 9 Score 9 11 4 3   Exception Documentation - - - -     Cognitive Function     6CIT Screen 05/14/2017 05/07/2016  What Year? 0 points 0 points  What month? 0 points 0 points  What time? 0 points 0 points  Count back from 20  0 points 0 points  Months in reverse 0 points 0 points  Repeat phrase 0 points 4 points  Total Score 0 4    Immunization History  Administered Date(s) Administered  . Influenza, High Dose Seasonal PF 01/07/2015,  11/14/2015, 11/06/2016  . Influenza, Seasonal, Injecte, Preservative Fre 10/27/2008, 12/13/2010, 11/05/2012  . Influenza,inj,Quad PF,6+ Mos 10/26/2013  . Influenza-Unspecified 10/06/2013  . Pneumococcal Conjugate-13 05/14/2017  . Pneumococcal Polysaccharide-23 11/25/2013    Qualifies for Shingles Vaccine? Discussed with patient.   Screening Tests Health Maintenance  Topic Date Due  . Hepatitis C Screening  October 21, 1950  . COLONOSCOPY  02/05/2026 (Originally 02/27/1999)  . INFLUENZA VACCINE  09/05/2017  . MAMMOGRAM  08/31/2018  . PNA vac Low Risk Adult (2 of 2 - PPSV23) 11/26/2018  . TETANUS/TDAP  02/05/2021  . DEXA SCAN  Completed    Cancer Screenings: Lung: Low Dose CT Chest recommended if Age 74-80 years, 30 pack-year currently smoking OR have quit w/in 15years. Patient does qualify. Declined  Breast:  Up to date on Mammogram? Yes   Up to date of Bone Density/Dexa? Yes Colorectal: cologuard information given to patient- declined at this time  Additional Screenings:  Hepatitis C Screening: will order for future order      Plan:     I have personally reviewed and addressed the Medicare Annual Wellness questionnaire and have noted the following in the patient's chart:  A. Medical and social history B. Use of alcohol, tobacco or illicit drugs  C. Current medications and supplements D. Functional ability and status E.  Nutritional status F.  Physical activity G. Advance directives H. List of other physicians I.  Hospitalizations, surgeries, and ER visits in previous 12 months J.  [place] such as hearing and vision if needed, cognitive and depression L. Referrals and appointments   In addition, I have reviewed and discussed with patient certain preventive protocols, quality metrics, and best practice recommendations. A written personalized care plan for preventive services as well as general preventive health recommendations were provided to  patient.   Signed,  Tyler Aas, LPN Nurse Health Advisor   Nurse Notes: Lissa Merlin discussed with patient coming back in 8-10 weeks for pneumovax 23 injection due to her being immunocompromised and on cosentyx. Patient verbalized understanding. Prevnar 13 done today.

## 2017-06-04 DIAGNOSIS — L408 Other psoriasis: Secondary | ICD-10-CM | POA: Diagnosis not present

## 2017-06-11 DIAGNOSIS — M5416 Radiculopathy, lumbar region: Secondary | ICD-10-CM | POA: Diagnosis not present

## 2017-06-18 ENCOUNTER — Other Ambulatory Visit: Payer: Self-pay

## 2017-06-18 DIAGNOSIS — I10 Essential (primary) hypertension: Secondary | ICD-10-CM

## 2017-06-18 MED ORDER — LISINOPRIL 10 MG PO TABS: 10.0000 mg | ORAL_TABLET | Freq: Every day | ORAL | 0 refills | Status: DC

## 2017-06-26 ENCOUNTER — Ambulatory Visit: Payer: PPO | Admitting: Nurse Practitioner

## 2017-06-26 ENCOUNTER — Encounter: Payer: Self-pay | Admitting: Nurse Practitioner

## 2017-06-26 ENCOUNTER — Other Ambulatory Visit: Payer: Self-pay

## 2017-06-26 VITALS — BP 116/78 | HR 95 | Temp 98.0°F | Resp 20 | Ht 62.0 in | Wt 181.0 lb

## 2017-06-26 DIAGNOSIS — R296 Repeated falls: Secondary | ICD-10-CM

## 2017-06-26 DIAGNOSIS — F410 Panic disorder [episodic paroxysmal anxiety] without agoraphobia: Secondary | ICD-10-CM | POA: Diagnosis not present

## 2017-06-26 MED ORDER — CLONAZEPAM 1 MG PO TABS: ORAL_TABLET | ORAL | 1 refills | Status: DC

## 2017-06-26 MED ORDER — QUAD CANE/SMALL BASE MISC: 1.0000 | Freq: Every day | 0 refills | Status: AC

## 2017-06-26 NOTE — Patient Instructions (Addendum)
[name] "[name]",   Thank you for coming in to clinic today.  1. PSYCHIATRY / [place] [facility] Referral - [place] ([facility]) Address: [address], [place], [place] [address] Hours: 8:30AM-5PM Phone: [phone]  IF [place] regional is not possible: Self Referral [facility]) [place] [address] [place], [place] [address] Phone: [phone]  [employer], available walk-in 9am-4pm M-F [place],  [address] Hours: [place] (M-F, walk in available) [phone]  2. Anxiety: - May take clonazepam 1/2 tablet (0.5 mg) in am and afternoon if needed.  Split your morning dose. - Continue 1 tablet (1 mg) at bedtime.   Please schedule a follow-up appointment with Cassell Smiles, AGNP. Return in about 2 months (around 08/26/2017) for anxiety (IF not connected to psychiatry)  AND 3 months for COPD and Hypertension.  If you have any other questions or concerns, please feel free to call the clinic or send a message through [place]. You may also schedule an earlier appointment if necessary.  You will receive a survey after today's visit either digitally by e-mail or paper by [employer]. Your experiences and feedback matter to [name].  Please respond so we know how we are doing as we provide care for you.   Cassell Smiles, DNP, AGNP-BC Adult Gerontology Nurse Practitioner [place]

## 2017-06-26 NOTE — Progress Notes (Signed)
Subjective:    Patient ID: [name], female    DOB: 16-Feb-1950, 67 y.o.   MRN: [id number]  [name] is a 67 y.o. female presenting on 06/26/2017 for Anxiety and Fall (2 falls in the last 2 weeks. The last fall was x 4 days ago. pt states her right hip gives out while walking and she falls. She is currently under evaluation by orthopedics and suppose to use a walker to prevent falls. )   HPI  Frequent Falls Right hip chronic pain and weakness.  Is connected to Dr. Harlow Mares for orthopedics.  Awaiting MRI.  Has had 3 falls in last month, 2 falls in last 2 weeks.  Pt notes hip "locks up."  She is not using a walker today.  Anxiety Patient is treated for anxiety currently with buspirone 7.5 mg tid, Celexa 40 mg, and clonazepam 1 mg bid.  Patient states she is having difficulty with keeping anxiety controlled through the afternoon.  She also states she will not use a four prong cane "because I don't want it to look medical."  GAD 7 : Generalized Anxiety Score 06/26/2017 05/14/2017 03/14/2017  Nervous, Anxious, on Edge 3 3 2   Control/stop worrying 1 2 2   Worry too much - different things 3 3 2   Trouble relaxing 1 2 1   Restless 1 0 0  Easily annoyed or irritable 0 0 0  Afraid - awful might happen 0 0 0  Total GAD 7 Score 9 10 7   Anxiety Difficulty Somewhat difficult Not difficult at all Not difficult at all     Depression screen [facility] 2/9 06/26/2017 05/14/2017 05/14/2017 04/11/2017 03/14/2017  Decreased Interest 0 2 2 1 1   Down, Depressed, Hopeless 1 2 3 1 1   PHQ - 2 Score 1 4 5 2 2   Altered sleeping 0 3 3 0 1  Tired, decreased energy 1 2 3  - 0  Change in appetite 0 0 0 1 0  Feeling bad or failure about yourself  0 0 0 0 0  Trouble concentrating 0 0 0 1 0  Moving slowly or fidgety/restless 0 0 0 0 0  Suicidal thoughts 0 0 0 0 0  PHQ-9 Score 2 9 11 4 3   Difficult doing work/chores Not difficult at all Not difficult at all Not difficult at all Not difficult at all Not difficult at all    Some recent data might be hidden    Social History   Tobacco Use  . Smoking status: Current Every Day Smoker    Packs/day: 1.00    Years: 35.00    Pack years: 35.00    Types: Cigarettes  . Smokeless tobacco: Never Used  . Tobacco comment: Previously smoked 2 ppd  Substance Use Topics  . Alcohol use: No  . Drug use: No    Review of Systems Per HPI unless specifically indicated above     Objective:    BP 116/78 (BP Location: Right Arm, Patient Position: Sitting, Cuff Size: Normal)   Pulse 95   Temp 98 F (36.7 C) (Oral)   Resp 20   Ht 5\' 2"  (1.575 m)   Wt 181 lb (82.1 kg)   SpO2 94%   BMI 33.11 kg/m   Wt Readings from Last 3 Encounters:  06/26/17 181 lb (82.1 kg)  05/14/17 179 lb (81.2 kg)  05/14/17 179 lb (81.2 kg)    Physical Exam  Constitutional: She is oriented to person, place, and time. She appears well-developed and well-nourished.  No distress.  HENT:  Head: Normocephalic and atraumatic.  Cardiovascular: Normal rate, regular rhythm, S1 normal, S2 normal, normal heart sounds and intact distal pulses.  Pulmonary/Chest: Effort normal and breath sounds normal. No respiratory distress.  Neurological: She is alert and oriented to person, place, and time. Gait abnormal.  Skin: Skin is warm and dry.  Psychiatric: Her speech is normal and behavior is normal. Her mood appears anxious.  Vitals reviewed.  Results for orders placed or performed in visit on 94/17/40  BASIC METABOLIC PANEL WITH GFR  Result Value Ref Range   Glucose, Bld 81 65 - 99 mg/dL   BUN 16 7 - 25 mg/dL   Creat 0.91 0.50 - 0.99 mg/dL   GFR, Est Non African American 65 > OR = 60 mL/min/1.64m2   GFR, Est African American 75 > OR = 60 mL/min/1.86m2   BUN/Creatinine Ratio NOT APPLICABLE 6 - 22 (calc)   Sodium 139 135 - 146 mmol/L   Potassium 4.7 3.5 - 5.3 mmol/L   Chloride 101 98 - 110 mmol/L   CO2 35 (H) 20 - 32 mmol/L   Calcium 9.4 8.6 - 10.4 mg/dL  TSH + free T4  Result Value Ref Range    TSH W/REFLEX TO FT4 4.23 0.40 - 4.50 mIU/L      Assessment & Plan:   Problem List Items Addressed This Visit      Other   Anxiety disorder (Chronic) Improving depression and stable anxiety since last visit.  Patient is taking clonazepam 1 mg bid, celexa, buspirone and requests better control of symptoms.  No recent psychiatry. - Change clonazepam 1 mg tablets to 1/2 tab in am, 1/2 tab in afternoon, 1 tab at bedtime. - same total daily dose - Continue all other medications without changes - Referral psychiatry.  Patient will benefit from medication management from specialist as well as therapy. - Follow-up 2 months.   Relevant Orders   Ambulatory referral to Psychiatry    Other Visit Diagnoses    Frequent falls    -  Primary Patient with frequent falls, orthopedic abnormality as cause.  Orthopedics referral existing.  No current PT in place.   - Use small quad cane base on standard cane.  Reinforced need to use assistive device to prevent falls.  Patient remains reluctant. - HH referral for PT. - Continue follow-up with Dr. Harlow Mares Orthopedics - Followup prn.   Relevant Medications   Misc. Devices (QUAD CANE/SMALL BASE) MISC   Other Relevant Orders   Ambulatory referral to [place] ordered this encounter  Medications  . Misc. Devices (QUAD CANE/SMALL BASE) MISC    Sig: 1 Device by Does not apply route daily.    Dispense:  1 each    Refill:  0    Order Specific Question:   Supervising Provider    Answer:   Olin Hauser [[id number]]  . clonazePAM (KLONOPIN) 1 MG tablet    Sig: Take 1/2 tablet (0.5mg  total) in morning and afternoon. Take 1 tablet (1mg  total) at bedtime.    Dispense:  60 tablet    Refill:  1    Filled 06/17/2017 no need to change that prescription.  Change next refill instructions.    Order Specific Question:   Supervising Provider    Answer:   Olin Hauser [[id number]]    Follow up plan: Return in about 2 months (around 08/26/2017)  for anxiety (IF not connected to psychiatry)  AND 3 months  for COPD and Hypertension.  Cassell Smiles, DNP, AGPCNP-BC Adult Gerontology Primary Care Nurse Practitioner [facility] 06/26/2017, 11:09 AM

## 2017-06-27 ENCOUNTER — Ambulatory Visit: Payer: Self-pay | Admitting: Nurse Practitioner

## 2017-07-02 ENCOUNTER — Telehealth: Payer: Self-pay

## 2017-07-02 DIAGNOSIS — F410 Panic disorder [episodic paroxysmal anxiety] without agoraphobia: Secondary | ICD-10-CM

## 2017-07-02 MED ORDER — CLONAZEPAM 1 MG PO TABS: ORAL_TABLET | ORAL | 1 refills | Status: DC

## 2017-07-02 NOTE — Telephone Encounter (Signed)
As discussed at appointment, will provide additional 1/2 tablet for next fill on 6/13.    - Take 1 tab in am, 1/2 tab in afternoon, 1 tab at bedtime.  Continue to recommend psychiatry for management of anxiety and medications as benzodiazepine use is not providing full relief and is increasingly tolerant /dependent on medication after taper down several months ago.  Pt expressed difficulty with cost of ARPA copay per visit.  Provided self referral options as below.    [facility]) [place] [address] [place], [place] [address] Phone: [phone]  [employer], available walk-in 9am-4pm M-F [place], [place] [address] Hours: [place] (M-F, walk in available) [phone]

## 2017-07-02 NOTE — Telephone Encounter (Signed)
Patient called stating she was asked to try to take her Clonazepam 1/2 during the day and 1/2 at night but this is not helping her.

## 2017-07-24 ENCOUNTER — Other Ambulatory Visit: Payer: Self-pay

## 2017-07-24 DIAGNOSIS — I10 Essential (primary) hypertension: Secondary | ICD-10-CM

## 2017-07-24 MED ORDER — DILTIAZEM HCL ER COATED BEADS 120 MG PO CP24: 120.0000 mg | ORAL_CAPSULE | Freq: Every day | ORAL | 1 refills | Status: DC

## 2017-07-26 ENCOUNTER — Telehealth: Payer: Self-pay

## 2017-07-26 NOTE — Telephone Encounter (Signed)
I spoke w/ the referral coordinator at [facility] concerning a referral sent over in 06/26/2017. They informed me that the reason why they did not proceed with the PT was because the pt declined the services.

## 2017-08-05 DIAGNOSIS — M5416 Radiculopathy, lumbar region: Secondary | ICD-10-CM | POA: Diagnosis not present

## 2017-08-12 DIAGNOSIS — M5416 Radiculopathy, lumbar region: Secondary | ICD-10-CM | POA: Diagnosis not present

## 2017-08-15 DIAGNOSIS — M5136 Other intervertebral disc degeneration, lumbar region: Secondary | ICD-10-CM | POA: Diagnosis not present

## 2017-08-15 DIAGNOSIS — M48061 Spinal stenosis, lumbar region without neurogenic claudication: Secondary | ICD-10-CM | POA: Diagnosis not present

## 2017-08-16 DIAGNOSIS — L408 Other psoriasis: Secondary | ICD-10-CM | POA: Diagnosis not present

## 2017-08-16 DIAGNOSIS — Z79899 Other long term (current) drug therapy: Secondary | ICD-10-CM | POA: Diagnosis not present

## 2017-08-16 DIAGNOSIS — Z1283 Encounter for screening for malignant neoplasm of skin: Secondary | ICD-10-CM | POA: Diagnosis not present

## 2017-08-20 DIAGNOSIS — M48061 Spinal stenosis, lumbar region without neurogenic claudication: Secondary | ICD-10-CM | POA: Diagnosis not present

## 2017-08-26 DIAGNOSIS — L408 Other psoriasis: Secondary | ICD-10-CM | POA: Diagnosis not present

## 2017-08-28 DIAGNOSIS — L408 Other psoriasis: Secondary | ICD-10-CM | POA: Diagnosis not present

## 2017-09-04 ENCOUNTER — Encounter: Payer: Self-pay | Admitting: Nurse Practitioner

## 2017-09-09 ENCOUNTER — Telehealth: Payer: Self-pay | Admitting: Nurse Practitioner

## 2017-09-09 DIAGNOSIS — F410 Panic disorder [episodic paroxysmal anxiety] without agoraphobia: Secondary | ICD-10-CM

## 2017-09-09 MED ORDER — CLONAZEPAM 1 MG PO TABS: ORAL_TABLET | ORAL | 0 refills | Status: DC

## 2017-09-09 NOTE — Telephone Encounter (Signed)
Pt.called requesting refill on clonazepam called into  [facility]

## 2017-09-24 ENCOUNTER — Other Ambulatory Visit: Payer: Self-pay

## 2017-09-24 DIAGNOSIS — I10 Essential (primary) hypertension: Secondary | ICD-10-CM

## 2017-09-24 MED ORDER — LISINOPRIL 10 MG PO TABS: 10.0000 mg | ORAL_TABLET | Freq: Every day | ORAL | 0 refills | Status: DC

## 2017-09-27 ENCOUNTER — Encounter: Payer: Self-pay | Admitting: Nurse Practitioner

## 2017-09-27 ENCOUNTER — Other Ambulatory Visit: Payer: Self-pay

## 2017-09-27 ENCOUNTER — Ambulatory Visit (INDEPENDENT_AMBULATORY_CARE_PROVIDER_SITE_OTHER): Payer: PPO | Admitting: Nurse Practitioner

## 2017-09-27 VITALS — BP 116/63 | HR 93 | Temp 98.4°F | Resp 20 | Ht 62.0 in | Wt 175.8 lb

## 2017-09-27 DIAGNOSIS — J449 Chronic obstructive pulmonary disease, unspecified: Secondary | ICD-10-CM | POA: Diagnosis not present

## 2017-09-27 DIAGNOSIS — F410 Panic disorder [episodic paroxysmal anxiety] without agoraphobia: Secondary | ICD-10-CM

## 2017-09-27 DIAGNOSIS — I10 Essential (primary) hypertension: Secondary | ICD-10-CM | POA: Diagnosis not present

## 2017-09-27 DIAGNOSIS — J441 Chronic obstructive pulmonary disease with (acute) exacerbation: Secondary | ICD-10-CM | POA: Diagnosis not present

## 2017-09-27 MED ORDER — BUDESONIDE-FORMOTEROL FUMARATE 160-4.5 MCG/ACT IN AERO: 2.0000 | INHALATION_SPRAY | Freq: Two times a day (BID) | RESPIRATORY_TRACT | 5 refills | Status: DC

## 2017-09-27 MED ORDER — CLONAZEPAM 1 MG PO TABS: ORAL_TABLET | ORAL | 0 refills | Status: DC

## 2017-09-27 MED ORDER — PREDNISONE 50 MG PO TABS: 50.0000 mg | ORAL_TABLET | Freq: Every day | ORAL | 0 refills | Status: AC

## 2017-09-27 NOTE — Progress Notes (Signed)
Subjective:    Patient ID: [name], female    DOB: Aug 03, 1950, 67 y.o.   MRN: [id number]  [name] is a 67 y.o. female presenting on 09/27/2017 for COPD and Hypertension   HPI COPD Using albuterol 3 times daily during summer. She avoids going outdoors at all because of the heat.  She continues Spiriva, Symbicort 80-4.5 2 puffs bid.  No increased work of breathing per patient.  She feels her breathing is ok, but does have occasionally shortness of breath relieved by albuterol. No new cough.    Hypertension - She is not checking BP at home or outside of clinic.    - Current medications: lisinopril 10 mg once adily and cardizem 120 mg once daily, tolerating well without side effects - She is not currently symptomatic.  Occasionally has dizziness, but not regularly. - Pt denies headache, lightheadedness, dizziness, changes in vision, chest tightness/pressure, palpitations, leg swelling, sudden loss of speech or loss of consciousness. - She  reports no regular exercise routine. - Her diet is low in salt, moderate in fat, and moderate in carbohydrates.   Anxiety Patient continues with chronic anxiety. Patient reports acute increase since notification of surgery Anxiety with shakiness and nervousness in afternoon.  Anxiety creates a desire "to just sit."  Used to walk to her friend's apartment, but this has stopped.  Back pain is decreasing walking.  Heat also worsens pts desire to walk.  Surgery for back pain will be Sept 5th.  Patient requests increase of clonazepam prior to surgery.  She is not currently on any pain medications.  Tramadol was not effective for controlling pain.   Social History   Tobacco Use  . Smoking status: Current Every Day Smoker    Packs/day: 1.00    Years: 35.00    Pack years: 35.00    Types: Cigarettes  . Smokeless tobacco: Never Used  . Tobacco comment: Previously smoked 2 ppd  Substance Use Topics  . Alcohol use: No  . Drug use: No     Review of Systems Per HPI unless specifically indicated above     Objective:    BP 116/63 (BP Location: Right Arm, Patient Position: Sitting, Cuff Size: Normal)   Pulse 93   Temp 98.4 F (36.9 C) (Oral)   Resp 20   Ht 5\' 2"  (1.575 m)   Wt 175 lb 12.8 oz (79.7 kg)   SpO2 90%   BMI 32.15 kg/m   Wt Readings from Last 3 Encounters:  09/27/17 175 lb 12.8 oz (79.7 kg)  06/26/17 181 lb (82.1 kg)  05/14/17 179 lb (81.2 kg)    Physical Exam  Constitutional: She is oriented to person, place, and time. She appears well-developed and well-nourished. No distress.  HENT:  Head: Normocephalic and atraumatic.  Cardiovascular: Normal rate, regular rhythm, S1 normal, S2 normal, normal heart sounds and intact distal pulses.  Pulmonary/Chest: Effort normal and breath sounds normal. No respiratory distress.  Abdominal: Soft. Bowel sounds are normal.  Neurological: She is alert and oriented to person, place, and time.  Skin: Skin is warm and dry. Capillary refill takes less than 2 seconds.  Psychiatric: Her speech is normal and behavior is normal. Judgment and thought content normal. Her mood appears anxious.  Vitals reviewed.   Results for orders placed or performed in visit on 02/77/41  BASIC METABOLIC PANEL WITH GFR  Result Value Ref Range   Glucose, Bld 81 65 - 99 mg/dL   BUN 16 7 -  25 mg/dL   Creat 0.91 0.50 - 0.99 mg/dL   GFR, Est Non African American 65 > OR = 60 mL/min/1.63m2   GFR, Est African American 75 > OR = 60 mL/min/1.93m2   BUN/Creatinine Ratio NOT APPLICABLE 6 - 22 (calc)   Sodium 139 135 - 146 mmol/L   Potassium 4.7 3.5 - 5.3 mmol/L   Chloride 101 98 - 110 mmol/L   CO2 35 (H) 20 - 32 mmol/L   Calcium 9.4 8.6 - 10.4 mg/dL  TSH + free T4  Result Value Ref Range   TSH W/REFLEX TO FT4 4.23 0.40 - 4.50 mIU/L      Assessment & Plan:   Problem List Items Addressed This Visit      Cardiovascular and Mediastinum   Benign essential HTN (Chronic)    Controlled  hypertension.  BP at goal of <130/80 today.  Pt is currently working on lifestyle modifications to continue heart healthy, low salt diet.  Taking medications tolerating well without side effects. Intermittent dizziness noted, but no additional complications.  Plan: 1. Continue taking lisinopril 10 mg once daily, diltiazem 24hr tab 120 mg once daily 2. Encouraged heart healthy diet and increasing exercise to 30 minutes most days of the week. 3. Check BP 1-2 x per week at home, keep log, and bring to clinic at next appointment. 4. Follow up 6 months.         Respiratory   COPD (chronic obstructive pulmonary disease) with chronic bronchitis (HCC) (Chronic)    Currently not well controlled.  Patient has increased to using albuterol 3 times daily, audible wheezing is present without stethoscope.  She continues daily Spiriva 80-4.5 2 puffs twice daily.  Patient continues to smoke 1 pack/day and has a 40-pack-year or more smoking history. - Current evidence of exacerbation with Sp02 88-90%, previously higher and presence of adventitious breath sounds.  Plan: 1.  Continue albuterol - Continue Spiriva  2.  INCREASE Symbicort 160-4.5 MCG/ACT inhaler.  Take 2 puffs twice daily - START prednisone 50 mg once daily x 5 days. 3.  Encouraged patient to continue reduction of smoking 4.  Follow-up 3 months.      Relevant Medications   budesonide-formoterol (SYMBICORT) 160-4.5 MCG/ACT inhaler   predniSONE (DELTASONE) 50 MG tablet     Other   Anxiety disorder (Chronic)    Pt with chronic anxiety and is currently partially controlled. Seems consistent with prior examination today, but per patient report, afternoon clonazepam is not controlling symptoms.   Plan: 1.  Continue citalopram to 40 mg once daily 2.  Change am dose of clonazepam to 1/2 mg (reduced from 1 mg), increase pm dose clonazepam to 1 mg (increased from 1/2 mg) and continue 1 mg at bedtime.  This is same TDD, but may be utilized at time  of day with highest anxiety. 3.  Encouraged patient to walk for 5-10 daily. 4.  Encouraged patient to continue family and social support for stress management.  Encouraged also use of spiritual support system. 5.  Followup 3 months      Relevant Medications   clonazePAM (KLONOPIN) 1 MG tablet    Other Visit Diagnoses    Acute exacerbation of chronic obstructive pulmonary disease (COPD) ([place])    -  Primary   Relevant Medications   budesonide-formoterol (SYMBICORT) 160-4.5 MCG/ACT inhaler   predniSONE (DELTASONE) 50 MG tablet     We will send clonazepam refill at 11/12/2017 for #75 plus 1 additional refill if dose is still working  well with 1/2 tab am, 1 tab afternoon, 1 tab bedtime.    Meds ordered this encounter  Medications  . budesonide-formoterol (SYMBICORT) 160-4.5 MCG/ACT inhaler    Sig: Inhale 2 puffs into the lungs 2 (two) times daily.    Dispense:  1 Inhaler    Refill:  5    Order Specific Question:   Supervising Provider    Answer:   Olin Hauser [[id number]]  . predniSONE (DELTASONE) 50 MG tablet    Sig: Take 1 tablet (50 mg total) by mouth daily with breakfast for 5 days.    Dispense:  5 tablet    Refill:  0    Order Specific Question:   Supervising Provider    Answer:   Olin Hauser [[id number]]  . clonazePAM (KLONOPIN) 1 MG tablet    Sig: Take 1/2 tab (0.5 mg total) in am, 1 tablet (1 mg total) in afternoon, 1 tablet (1 mg total) at bedtime.    Dispense:  75 tablet    Refill:  0    Order Specific Question:   Supervising Provider    Answer:   Olin Hauser [[id number]]    Follow up plan: Return in about 3 months (around 12/28/2017) for anxiety.  Cassell Smiles, DNP, AGPCNP-BC Adult Gerontology Primary Care Nurse Practitioner [facility] 09/27/2017, 12:52 PM

## 2017-09-27 NOTE — Patient Instructions (Addendum)
[name] "[name]",   Thank you for coming in to clinic today.  1. Change your clonazepam 1/2 tablet in the morning.  Take 1 full tablet in afternoon and at bedtime.  2. Continue blood pressure medication without change.  3. INCREASE symbicort to 160-4.5 mg 2 puff twice daily.  Same instructions, but higher medication amount. - START prednisone 50 mg once daily for 5 days.  This should help your breathing significantly.  Please schedule a follow-up appointment with Cassell Smiles, AGNP. Return in about 3 months (around 12/28/2017) for anxiety.  If you have any other questions or concerns, please feel free to call the clinic or send a message through [place]. You may also schedule an earlier appointment if necessary.  You will receive a survey after today's visit either digitally by e-mail or paper by [employer]. Your experiences and feedback matter to [name].  Please respond so we know how we are doing as we provide care for you.   Cassell Smiles, DNP, AGNP-BC Adult Gerontology Nurse Practitioner [place]

## 2017-09-27 NOTE — Assessment & Plan Note (Addendum)
Controlled hypertension.  BP at goal of <130/80 today.  Pt is currently working on lifestyle modifications to continue heart healthy, low salt diet.  Taking medications tolerating well without side effects. Intermittent dizziness noted, but no additional complications.  Plan: 1. Continue taking lisinopril 10 mg once daily, diltiazem 24hr tab 120 mg once daily 2. Encouraged heart healthy diet and increasing exercise to 30 minutes most days of the week. 3. Check BP 1-2 x per week at home, keep log, and bring to clinic at next appointment. 4. Follow up 6 months.

## 2017-09-27 NOTE — Assessment & Plan Note (Signed)
Pt with chronic anxiety and is currently partially controlled. Seems consistent with prior examination today, but per patient report, afternoon clonazepam is not controlling symptoms.   Plan: 1.  Continue citalopram to 40 mg once daily 2.  Change am dose of clonazepam to 1/2 mg (reduced from 1 mg), increase pm dose clonazepam to 1 mg (increased from 1/2 mg) and continue 1 mg at bedtime.  This is same TDD, but may be utilized at time of day with highest anxiety. 3.  Encouraged patient to walk for 5-10 daily. 4.  Encouraged patient to continue family and social support for stress management.  Encouraged also use of spiritual support system. 5.  Followup 3 months

## 2017-09-27 NOTE — Assessment & Plan Note (Addendum)
Currently not well controlled.  Patient has increased to using albuterol 3 times daily, audible wheezing is present without stethoscope.  She continues daily Spiriva 80-4.5 2 puffs twice daily.  Patient continues to smoke 1 pack/day and has a 40-pack-year or more smoking history. - Current evidence of exacerbation with Sp02 88-90%, previously higher and presence of adventitious breath sounds.  Plan: 1.  Continue albuterol - Continue Spiriva  2.  INCREASE Symbicort 160-4.5 MCG/ACT inhaler.  Take 2 puffs twice daily - START prednisone 50 mg once daily x 5 days. 3.  Encouraged patient to continue reduction of smoking 4.  Follow-up 3 months.

## 2017-10-08 ENCOUNTER — Telehealth: Payer: Self-pay | Admitting: Nurse Practitioner

## 2017-10-08 NOTE — Telephone Encounter (Signed)
Pt wants to discuss increasing klonopin dosage [phone]

## 2017-10-08 NOTE — Telephone Encounter (Signed)
The pt called requesting that you increase her Klonopin dosages She currently have back surgery scheduled for Thursday and currently concern how she's going to manage.

## 2017-10-08 NOTE — Telephone Encounter (Signed)
We discussed this at her last visit.  No dose increase will be provided.

## 2017-10-09 ENCOUNTER — Other Ambulatory Visit: Payer: Self-pay

## 2017-10-09 DIAGNOSIS — E039 Hypothyroidism, unspecified: Secondary | ICD-10-CM

## 2017-10-09 MED ORDER — LEVOTHYROXINE SODIUM 125 MCG PO TABS: 125.0000 ug | ORAL_TABLET | Freq: Every day | ORAL | 5 refills | Status: DC

## 2017-10-09 NOTE — Telephone Encounter (Signed)
The pt was notified. 

## 2017-10-21 DIAGNOSIS — Z01818 Encounter for other preprocedural examination: Secondary | ICD-10-CM | POA: Diagnosis not present

## 2017-10-28 ENCOUNTER — Inpatient Hospital Stay
Admission: EM | Admit: 2017-10-28 | Discharge: 2017-10-30 | DRG: 190 | Disposition: A | Discharge status: Home / Self Care | Payer: PPO | Attending: Internal Medicine | Admitting: Internal Medicine

## 2017-10-28 ENCOUNTER — Other Ambulatory Visit: Payer: Self-pay

## 2017-10-28 ENCOUNTER — Emergency Department: Payer: PPO

## 2017-10-28 DIAGNOSIS — E039 Hypothyroidism, unspecified: Secondary | ICD-10-CM | POA: Diagnosis present

## 2017-10-28 DIAGNOSIS — J189 Pneumonia, unspecified organism: Secondary | ICD-10-CM | POA: Diagnosis present

## 2017-10-28 DIAGNOSIS — D72829 Elevated white blood cell count, unspecified: Secondary | ICD-10-CM | POA: Diagnosis not present

## 2017-10-28 DIAGNOSIS — R739 Hyperglycemia, unspecified: Secondary | ICD-10-CM | POA: Diagnosis not present

## 2017-10-28 DIAGNOSIS — Z79899 Other long term (current) drug therapy: Secondary | ICD-10-CM

## 2017-10-28 DIAGNOSIS — Z7951 Long term (current) use of inhaled steroids: Secondary | ICD-10-CM | POA: Diagnosis not present

## 2017-10-28 DIAGNOSIS — T380X5A Adverse effect of glucocorticoids and synthetic analogues, initial encounter: Secondary | ICD-10-CM | POA: Diagnosis not present

## 2017-10-28 DIAGNOSIS — F329 Major depressive disorder, single episode, unspecified: Secondary | ICD-10-CM | POA: Diagnosis present

## 2017-10-28 DIAGNOSIS — M549 Dorsalgia, unspecified: Secondary | ICD-10-CM | POA: Diagnosis not present

## 2017-10-28 DIAGNOSIS — R0602 Shortness of breath: Secondary | ICD-10-CM | POA: Diagnosis not present

## 2017-10-28 DIAGNOSIS — J441 Chronic obstructive pulmonary disease with (acute) exacerbation: Secondary | ICD-10-CM | POA: Diagnosis not present

## 2017-10-28 DIAGNOSIS — F17210 Nicotine dependence, cigarettes, uncomplicated: Secondary | ICD-10-CM | POA: Diagnosis not present

## 2017-10-28 DIAGNOSIS — J449 Chronic obstructive pulmonary disease, unspecified: Secondary | ICD-10-CM | POA: Diagnosis not present

## 2017-10-28 DIAGNOSIS — L409 Psoriasis, unspecified: Secondary | ICD-10-CM | POA: Diagnosis not present

## 2017-10-28 DIAGNOSIS — F419 Anxiety disorder, unspecified: Secondary | ICD-10-CM | POA: Diagnosis present

## 2017-10-28 DIAGNOSIS — Z720 Tobacco use: Secondary | ICD-10-CM | POA: Diagnosis not present

## 2017-10-28 DIAGNOSIS — Z882 Allergy status to sulfonamides status: Secondary | ICD-10-CM | POA: Diagnosis not present

## 2017-10-28 DIAGNOSIS — R05 Cough: Secondary | ICD-10-CM | POA: Diagnosis not present

## 2017-10-28 DIAGNOSIS — I10 Essential (primary) hypertension: Secondary | ICD-10-CM | POA: Diagnosis present

## 2017-10-28 DIAGNOSIS — Z885 Allergy status to narcotic agent status: Secondary | ICD-10-CM | POA: Diagnosis not present

## 2017-10-28 DIAGNOSIS — Z881 Allergy status to other antibiotic agents status: Secondary | ICD-10-CM | POA: Diagnosis not present

## 2017-10-28 DIAGNOSIS — G8929 Other chronic pain: Secondary | ICD-10-CM | POA: Diagnosis present

## 2017-10-28 DIAGNOSIS — J440 Chronic obstructive pulmonary disease with acute lower respiratory infection: Secondary | ICD-10-CM | POA: Diagnosis present

## 2017-10-28 DIAGNOSIS — Z886 Allergy status to analgesic agent status: Secondary | ICD-10-CM

## 2017-10-28 HISTORY — DX: Chronic obstructive pulmonary disease, unspecified: J44.9

## 2017-10-28 LAB — CBC WITH DIFFERENTIAL/PLATELET
BASOS ABS: 0.1 10*3/uL (ref 0–0.1)
BASOS PCT: 1 %
EOS PCT: 3 %
Eosinophils Absolute: 0.3 10*3/uL (ref 0–0.7)
HCT: 44.8 % (ref 35.0–47.0)
Hemoglobin: 15.2 g/dL (ref 12.0–16.0)
Lymphocytes Relative: 16 %
Lymphs Abs: 1.7 10*3/uL (ref 1.0–3.6)
MCH: 31.2 pg (ref 26.0–34.0)
MCHC: 33.9 g/dL (ref 32.0–36.0)
MCV: 91.9 fL (ref 80.0–100.0)
MONO ABS: 1.1 10*3/uL — AB (ref 0.2–0.9)
Monocytes Relative: 10 %
Neutro Abs: 7.7 10*3/uL — ABNORMAL HIGH (ref 1.4–6.5)
Neutrophils Relative %: 70 %
PLATELETS: 283 10*3/uL (ref 150–440)
RBC: 4.87 MIL/uL (ref 3.80–5.20)
RDW: 13.9 % (ref 11.5–14.5)
WBC: 10.8 10*3/uL (ref 3.6–11.0)

## 2017-10-28 LAB — GLUCOSE, CAPILLARY: GLUCOSE-CAPILLARY: 265 mg/dL — AB (ref 70–99)

## 2017-10-28 LAB — BASIC METABOLIC PANEL
ANION GAP: 11 (ref 5–15)
BUN: 11 mg/dL (ref 8–23)
CO2: 29 mmol/L (ref 22–32)
Calcium: 8.9 mg/dL (ref 8.9–10.3)
Chloride: 98 mmol/L (ref 98–111)
Creatinine, Ser: 0.88 mg/dL (ref 0.44–1.00)
Glucose, Bld: 100 mg/dL — ABNORMAL HIGH (ref 70–99)
Potassium: 4 mmol/L (ref 3.5–5.1)
Sodium: 138 mmol/L (ref 135–145)

## 2017-10-28 LAB — TROPONIN I

## 2017-10-28 LAB — BRAIN NATRIURETIC PEPTIDE: B Natriuretic Peptide: 45 pg/mL (ref 0.0–100.0)

## 2017-10-28 MED ORDER — CITALOPRAM HYDROBROMIDE 20 MG PO TABS
40.0000 mg | ORAL_TABLET | Freq: Every day | ORAL | Status: DC
  Administered 2017-10-29 – 2017-10-30 (×2): 40 mg via ORAL
  Filled 2017-10-28 (×2): qty 2

## 2017-10-28 MED ORDER — ONDANSETRON HCL 4 MG/2ML IJ SOLN: 4.0000 mg | Freq: Four times a day (QID) | INTRAMUSCULAR | Status: DC | PRN

## 2017-10-28 MED ORDER — IPRATROPIUM-ALBUTEROL 0.5-2.5 (3) MG/3ML IN SOLN
3.0000 mL | Freq: Four times a day (QID) | RESPIRATORY_TRACT | Status: DC
  Administered 2017-10-28 – 2017-10-30 (×7): 3 mL via RESPIRATORY_TRACT
  Filled 2017-10-28 (×7): qty 3

## 2017-10-28 MED ORDER — GABAPENTIN 300 MG PO CAPS
300.0000 mg | ORAL_CAPSULE | Freq: Every morning | ORAL | Status: DC
  Administered 2017-10-29 – 2017-10-30 (×2): 300 mg via ORAL
  Filled 2017-10-28 (×2): qty 1

## 2017-10-28 MED ORDER — SODIUM CHLORIDE 0.9% FLUSH
3.0000 mL | Freq: Two times a day (BID) | INTRAVENOUS | Status: DC
  Administered 2017-10-28 – 2017-10-29 (×3): 3 mL via INTRAVENOUS

## 2017-10-28 MED ORDER — ORAL CARE MOUTH RINSE
15.0000 mL | Freq: Two times a day (BID) | OROMUCOSAL | Status: DC
  Administered 2017-10-28 – 2017-10-29 (×3): 15 mL via OROMUCOSAL

## 2017-10-28 MED ORDER — BUSPIRONE HCL 15 MG PO TABS
7.5000 mg | ORAL_TABLET | Freq: Every day | ORAL | Status: DC
  Administered 2017-10-29 – 2017-10-30 (×2): 7.5 mg via ORAL
  Filled 2017-10-28 (×2): qty 1

## 2017-10-28 MED ORDER — IPRATROPIUM-ALBUTEROL 0.5-2.5 (3) MG/3ML IN SOLN
3.0000 mL | Freq: Once | RESPIRATORY_TRACT | Status: AC
  Administered 2017-10-28: 3 mL via RESPIRATORY_TRACT
  Filled 2017-10-28: qty 3

## 2017-10-28 MED ORDER — SODIUM CHLORIDE 0.9 % IV SOLN
500.0000 mg | INTRAVENOUS | Status: DC
  Administered 2017-10-29: 500 mg via INTRAVENOUS
  Filled 2017-10-28 (×2): qty 500

## 2017-10-28 MED ORDER — GUAIFENESIN ER 600 MG PO TB12
600.0000 mg | ORAL_TABLET | Freq: Two times a day (BID) | ORAL | Status: DC
  Administered 2017-10-28 – 2017-10-30 (×4): 600 mg via ORAL
  Filled 2017-10-28 (×4): qty 1

## 2017-10-28 MED ORDER — ACETAMINOPHEN 650 MG RE SUPP: 650.0000 mg | Freq: Four times a day (QID) | RECTAL | Status: DC | PRN

## 2017-10-28 MED ORDER — CLONAZEPAM 0.5 MG PO TABS
0.5000 mg | ORAL_TABLET | Freq: Every day | ORAL | Status: DC
  Administered 2017-10-28 – 2017-10-29 (×2): 0.5 mg via ORAL
  Filled 2017-10-28 (×2): qty 1

## 2017-10-28 MED ORDER — DILTIAZEM HCL ER COATED BEADS 120 MG PO CP24
120.0000 mg | ORAL_CAPSULE | Freq: Every day | ORAL | Status: DC
  Administered 2017-10-29 – 2017-10-30 (×2): 120 mg via ORAL
  Filled 2017-10-28 (×2): qty 1

## 2017-10-28 MED ORDER — MORPHINE SULFATE 15 MG PO TABS
15.0000 mg | ORAL_TABLET | ORAL | Status: DC | PRN
  Administered 2017-10-28 – 2017-10-30 (×4): 15 mg via ORAL
  Filled 2017-10-28 (×4): qty 1

## 2017-10-28 MED ORDER — PREDNISONE 10 MG (21) PO TBPK: ORAL_TABLET | ORAL | 0 refills | Status: DC

## 2017-10-28 MED ORDER — IBUPROFEN 400 MG PO TABS: 200.0000 mg | ORAL_TABLET | Freq: Four times a day (QID) | ORAL | Status: DC | PRN

## 2017-10-28 MED ORDER — METHYLPREDNISOLONE SODIUM SUCC 125 MG IJ SOLR
60.0000 mg | Freq: Four times a day (QID) | INTRAMUSCULAR | Status: DC
  Administered 2017-10-28 – 2017-10-29 (×3): 60 mg via INTRAVENOUS
  Filled 2017-10-28 (×3): qty 2

## 2017-10-28 MED ORDER — SODIUM CHLORIDE 0.9 % IV SOLN
500.0000 mg | Freq: Once | INTRAVENOUS | Status: AC
  Administered 2017-10-28: 500 mg via INTRAVENOUS
  Filled 2017-10-28: qty 500

## 2017-10-28 MED ORDER — BUDESONIDE 0.25 MG/2ML IN SUSP
0.2500 mg | Freq: Two times a day (BID) | RESPIRATORY_TRACT | Status: DC
  Administered 2017-10-28 – 2017-10-30 (×4): 0.25 mg via RESPIRATORY_TRACT
  Filled 2017-10-28 (×4): qty 2

## 2017-10-28 MED ORDER — GABAPENTIN 300 MG PO CAPS
600.0000 mg | ORAL_CAPSULE | Freq: Every day | ORAL | Status: DC
  Administered 2017-10-28 – 2017-10-29 (×2): 600 mg via ORAL
  Filled 2017-10-28 (×2): qty 2

## 2017-10-28 MED ORDER — ACETAMINOPHEN 325 MG PO TABS: 650.0000 mg | ORAL_TABLET | Freq: Four times a day (QID) | ORAL | Status: DC | PRN

## 2017-10-28 MED ORDER — ONDANSETRON HCL 4 MG PO TABS: 4.0000 mg | ORAL_TABLET | Freq: Four times a day (QID) | ORAL | Status: DC | PRN

## 2017-10-28 MED ORDER — SODIUM CHLORIDE 0.9 % IV SOLN: 250.0000 mL | INTRAVENOUS | Status: DC | PRN

## 2017-10-28 MED ORDER — GUAIFENESIN-DM 100-10 MG/5ML PO SYRP
15.0000 mL | ORAL_SOLUTION | ORAL | Status: DC | PRN
  Administered 2017-10-28: 15 mL via ORAL
  Filled 2017-10-28: qty 15

## 2017-10-28 MED ORDER — LISINOPRIL 10 MG PO TABS
10.0000 mg | ORAL_TABLET | Freq: Every day | ORAL | Status: DC
  Administered 2017-10-29 – 2017-10-30 (×2): 10 mg via ORAL
  Filled 2017-10-28 (×2): qty 1

## 2017-10-28 MED ORDER — CLONAZEPAM 1 MG PO TABS
1.0000 mg | ORAL_TABLET | Freq: Two times a day (BID) | ORAL | Status: DC
  Administered 2017-10-28 – 2017-10-30 (×4): 1 mg via ORAL
  Filled 2017-10-28 (×4): qty 1

## 2017-10-28 MED ORDER — ENOXAPARIN SODIUM 40 MG/0.4ML ~~LOC~~ SOLN
40.0000 mg | SUBCUTANEOUS | Status: DC
  Administered 2017-10-28 – 2017-10-29 (×2): 40 mg via SUBCUTANEOUS
  Filled 2017-10-28 (×2): qty 0.4

## 2017-10-28 MED ORDER — AZITHROMYCIN 250 MG PO TABS: ORAL_TABLET | ORAL | 0 refills | Status: DC

## 2017-10-28 MED ORDER — SODIUM CHLORIDE 0.9 % IV SOLN
1.0000 g | Freq: Once | INTRAVENOUS | Status: AC
  Administered 2017-10-28: 1 g via INTRAVENOUS
  Filled 2017-10-28: qty 10

## 2017-10-28 MED ORDER — METHYLPREDNISOLONE SODIUM SUCC 125 MG IJ SOLR
125.0000 mg | Freq: Once | INTRAMUSCULAR | Status: AC
  Administered 2017-10-28: 125 mg via INTRAVENOUS
  Filled 2017-10-28: qty 2

## 2017-10-28 MED ORDER — LEVOTHYROXINE SODIUM 25 MCG PO TABS
125.0000 ug | ORAL_TABLET | Freq: Every day | ORAL | Status: DC
  Administered 2017-10-29 – 2017-10-30 (×2): 125 ug via ORAL
  Filled 2017-10-28 (×2): qty 1

## 2017-10-28 MED ORDER — SODIUM CHLORIDE 0.9 % IV SOLN
1.0000 g | INTRAVENOUS | Status: DC
  Administered 2017-10-29: 1 g via INTRAVENOUS
  Filled 2017-10-28: qty 1
  Filled 2017-10-28: qty 10

## 2017-10-28 MED ORDER — SODIUM CHLORIDE 0.9% FLUSH: 3.0000 mL | INTRAVENOUS | Status: DC | PRN

## 2017-10-28 NOTE — ED Triage Notes (Signed)
To ER via [facility] from home c/o cough, sob and generalized body aches over last few days. Pt 90% with FD upon first arrival and placed on 2L [place]. Pt 92% on RA currently. Denies CP. States she has chronic cough but this cough is different. Nonproductive.

## 2017-10-28 NOTE — Progress Notes (Signed)
Report received from Amy, ED RN

## 2017-10-28 NOTE — ED Notes (Signed)
Report to Amy, RN

## 2017-10-28 NOTE — Progress Notes (Signed)
Advanced care plan.  Purpose of the Encounter: CODE STATUS  Parties in Attendance: Patient herself  Patient's Decision Capacity: Intact  Subjective/Patient's story: Patient is a 67 year old with history of COPD essential hypertension depression anxiety presenting with shortness of breath cough noted to have COPD exasperation   Objective/Medical story  I discussed with the patient regarding her desires for cardiac and pulmonary resuscitation  Goals of care determination: Patient states that she would like to be a full code for now but if after being resuscitated there is no chance for her improvement then would not want to continue to live for prolonged period time   CODE STATUS: Full code   Time spent discussing advanced care planning: 16 minutes

## 2017-10-28 NOTE — ED Notes (Signed)
Placed on 2L [place]  

## 2017-10-28 NOTE — ED Notes (Signed)
Admitting MD at bedside.

## 2017-10-28 NOTE — ED Notes (Signed)
Attempt to call report, unsuccessful.

## 2017-10-28 NOTE — ED Notes (Signed)
Report called to [facility] rn floor nurse

## 2017-10-28 NOTE — H&P (Addendum)
[place] [facility] [place] NAME: [name]    MR#:  [id number]  DATE OF BIRTH:  [birth year]  DATE OF ADMISSION:  10/28/2017  PRIMARY CARE PHYSICIAN: Mikey College, NP   REQUESTING/REFERRING PHYSICIAN: Lenise Arena, MD  CHIEF COMPLAINT:   Chief Complaint  Patient presents with  . Shortness of Breath  . Cough    HISTORY OF PRESENT ILLNESS: [name]  is a 67 y.o. female with a known history of anxiety, arthritis, COPD, depression, GERD, hypertension, hypothyroidism and psoriasis who is presenting to the hospital with complaint of shortness of breath and cough.  Patient states that her symptoms started few days ago and has progressively got worse.  She initially had productive sputum of yellow color now her sputum is not productive.  Patient has not had any fevers or chills.  Denies any chest pain.  She denies any nausea vomiting or diarrhea.     PAST MEDICAL HISTORY:   Past Medical History:  Diagnosis Date  . Anxiety   . Arthritis   . Chronic back pain   . COPD (chronic obstructive pulmonary disease) ([place])   . Depression   . GERD (gastroesophageal reflux disease)   . Hypertension   . Hypothyroidism   . Pneumonia    RECENT X 3  . PONV (postoperative nausea and vomiting)   . Psoriasis   . Thyroid disease     PAST SURGICAL HISTORY:  Past Surgical History:  Procedure Laterality Date  . ABDOMINAL HYSTERECTOMY     heavy bleeding  . bladder stimulator    . CARPAL TUNNEL RELEASE    . CATARACT EXTRACTION W/PHACO Right 03/07/2015   Procedure: CATARACT EXTRACTION PHACO AND INTRAOCULAR LENS PLACEMENT (IOC);  Surgeon: Estill Cotta, MD;  Location: [facility] ORS;  Service: Ophthalmology;  Laterality: Right;  [name] 01:14 AP% 26.4 CDE 33.04 fluid pack lot # [id number]  . CATARACT EXTRACTION W/PHACO Left 03/21/2015   Procedure: CATARACT EXTRACTION PHACO AND INTRAOCULAR LENS PLACEMENT (IOC);  Surgeon: Estill Cotta, MD;  Location: [facility] ORS;   Service: Ophthalmology;  Laterality: Left;  [name] 01:21 AP% 25.6 CDE 39.78 fluid pack lot # [id number]  . HAND SURGERY Right   . INTERSTIM IMPLANT REMOVAL N/A 09/07/2015   Procedure: REMOVAL OF INTERSTIM IMPLANT;  Surgeon: Bjorn Loser, MD;  Location: [facility] ORS;  Service: Urology;  Laterality: N/A;  . OOPHORECTOMY Right    ovarian cyst    SOCIAL HISTORY:  Social History   Tobacco Use  . Smoking status: Current Every Day Smoker    Packs/day: 1.00    Years: 35.00    Pack years: 35.00    Types: Cigarettes  . Smokeless tobacco: Never Used  . Tobacco comment: Previously smoked 2 ppd  Substance Use Topics  . Alcohol use: No    FAMILY HISTORY:  Family History  Problem Relation Age of Onset  . Heart disease Mother   . Breast cancer Mother   . Heart attack Mother   . COPD Father   . Stroke Paternal Uncle   . Kidney disease Sister   . Breast cancer Cousin   . Breast cancer Cousin   . Heart attack Maternal Aunt   . Stroke Paternal Grandfather   . Kidney cancer Neg Hx   . Bladder Cancer Neg Hx     DRUG ALLERGIES:  Allergies  Allergen Reactions  . Ciprofloxacin Hives and Diarrhea  . Oxycodone Diarrhea  . Oxycodone-Acetaminophen Diarrhea  . Oxycodone-Acetaminophen Diarrhea  . Sulfa Antibiotics  Rash    Throat, mouth Throat, mouth    REVIEW OF SYSTEMS:   CONSTITUTIONAL: No fever, fatigue or weakness.  EYES: No blurred or double vision.  EARS, NOSE, AND THROAT: No tinnitus or ear pain.  RESPIRATORY: Positive cough, positive shortness of breath, positive wheezing or hemoptysis.  CARDIOVASCULAR: No chest pain, orthopnea, edema.  GASTROINTESTINAL: No nausea, vomiting, diarrhea or abdominal pain.  GENITOURINARY: No dysuria, hematuria.  ENDOCRINE: No polyuria, nocturia,  HEMATOLOGY: No anemia, easy bruising or bleeding SKIN: No rash or lesion. MUSCULOSKELETAL: No joint pain or arthritis.   NEUROLOGIC: No tingling, numbness, weakness.  PSYCHIATRY: No anxiety or depression.    MEDICATIONS AT HOME:  Prior to Admission medications   Medication Sig Start Date End Date Taking? Authorizing Provider  acetaminophen (TYLENOL) 500 MG tablet Take 500 mg by mouth every 6 (six) hours as needed for moderate pain. Reported on 07/01/2015    [provider]  albuterol (PROVENTIL HFA;VENTOLIN HFA) 108 (90 Base) MCG/ACT inhaler Inhale 2 puffs into the lungs every 6 (six) hours as needed for wheezing or shortness of breath. 11/06/16   Roselee Nova, MD  augmented betamethasone dipropionate (DIPROLENE-AF) 0.05 % cream Apply 1 application topically daily. 02/11/17   [provider]  azithromycin (ZITHROMAX Z-PAK) 250 MG tablet Take 2 tablets (500 mg) on  Day 1,  followed by 1 tablet (250 mg) once daily on Days 2 through 5. 10/28/17   Earleen Newport, MD  budesonide-formoterol [facility]) 160-4.5 MCG/ACT inhaler Inhale 2 puffs into the lungs 2 (two) times daily. 09/27/17   Mikey College, NP  busPIRone (BUSPAR) 7.5 MG tablet Take 1 tablet (7.5 mg total) by mouth 3 (three) times daily. 05/14/17   Mikey College, NP  calcipotriene (DOVONOX) 0.005 % cream Apply 1 application topically daily. 02/12/17   [provider]  citalopram (CELEXA) 40 MG tablet Take 1 tablet (40 mg total) by mouth daily. 04/11/17   Mikey College, NP  clonazePAM (KLONOPIN) 1 MG tablet Take 1/2 tab (0.5 mg total) in am, 1 tablet (1 mg total) in afternoon, 1 tablet (1 mg total) at bedtime. 09/27/17 10/27/17  Mikey College, NP  clotrimazole-betamethasone (LOTRISONE) cream Apply 1 application topically 2 (two) times daily. 11/08/16   Gae Dry, MD  cyclobenzaprine (FLEXERIL) 10 MG tablet Take 10 mg by mouth 3 (three) times daily. 04/09/17   [provider]  diltiazem (CARDIZEM CD) 120 MG 24 hr capsule Take 1 capsule (120 mg total) by mouth daily. 07/24/17   Mikey College, NP  gabapentin (NEURONTIN) 300 MG capsule TAKE ONE CAPSULE BY MOUTH EVERY MORNING &  TAKE TWO TABLETS BY MOUTH AT BEDTIME 05/28/17   [provider]  levothyroxine (SYNTHROID, LEVOTHROID) 125 MCG tablet Take 1 tablet (125 mcg total) by mouth daily with breakfast. 10/09/17   Mikey College, NP  lisinopril (PRINIVIL,ZESTRIL) 10 MG tablet Take 1 tablet (10 mg total) by mouth daily. 09/24/17   Mikey College, NP  mirabegron ER (MYRBETRIQ) 25 MG TB24 tablet Take 1 tablet (25 mg total) by mouth daily. Patient not taking: Reported on 09/27/2017 08/29/16   Nori Riis, PA-C  Misc. Devices (QUAD CANE/SMALL BASE) MISC 1 Device by Does not apply route daily. Patient not taking: Reported on 09/27/2017 06/26/17   Mikey College, NP  predniSONE (STERAPRED UNI-PAK 21 TAB) 10 MG (21) TBPK tablet Dispense taper pack as directed 10/28/17   Earleen Newport, MD  Secukinumab (COSENTYX) 150 MG/ML  SOSY Inject 150 mg into the skin every 14 (fourteen) days. 2 injections per month    [provider]  tiotropium (SPIRIVA) 18 MCG inhalation capsule Place 1 capsule (18 mcg total) into inhaler and inhale daily. 03/14/17   Mikey College, NP  traMADol (ULTRAM) 50 MG tablet Take 50 mg by mouth every 6 (six) hours as needed. 04/09/17   [provider]  triamcinolone (KENALOG) 0.025 % ointment Apply 1 application topically as directed.  02/08/16   [provider]      PHYSICAL EXAMINATION:   VITAL SIGNS: Blood pressure (!) 143/68, pulse 97, temperature 98.7 F (37.1 C), temperature source Oral, resp. rate 19, height 5\' 2"  (1.575 m), weight 79.4 kg, SpO2 96 %.  GENERAL:  67 y.o.-year-old patient lying in the bed with no acute distress.  EYES: Pupils equal, round, reactive to light and accommodation. No scleral icterus. Extraocular muscles intact.  HEENT: Head atraumatic, normocephalic. Oropharynx and nasopharynx clear.  NECK:  Supple, no jugular venous distention. No thyroid enlargement, no tenderness.  LUNGS: Bilateral wheezing throughout both lungs  no accessory muscle usage CARDIOVASCULAR: S1, S2 normal. No murmurs, rubs, or gallops.  ABDOMEN: Soft, nontender, nondistended. Bowel sounds present. No organomegaly or mass.  EXTREMITIES: No pedal edema, cyanosis, or clubbing.  NEUROLOGIC: Cranial nerves II through XII are intact. Muscle strength 5/5 in all extremities. Sensation intact. Gait not checked.  PSYCHIATRIC: The patient is alert and oriented x 3.  SKIN: No obvious rash, lesion, or ulcer.   LABORATORY PANEL:   CBC Recent Labs  Lab 10/28/17 1138  WBC 10.8  HGB 15.2  HCT 44.8  PLT 283  MCV 91.9  MCH 31.2  MCHC 33.9  RDW 13.9  LYMPHSABS 1.7  MONOABS 1.1*  EOSABS 0.3  BASOSABS 0.1   ------------------------------------------------------------------------------------------------------------------  Chemistries  Recent Labs  Lab 10/28/17 1138  NA 138  K 4.0  CL 98  CO2 29  GLUCOSE 100*  BUN 11  CREATININE 0.88  CALCIUM 8.9   ------------------------------------------------------------------------------------------------------------------ estimated creatinine clearance is 59.7 mL/min (by C-G formula based on SCr of 0.88 mg/dL). ------------------------------------------------------------------------------------------------------------------ No results for input(s): TSH, T4TOTAL, T3FREE, THYROIDAB in the last 72 hours.  Invalid input(s): FREET3   Coagulation profile No results for input(s): INR, PROTIME in the last 168 hours. ------------------------------------------------------------------------------------------------------------------- No results for input(s): DDIMER in the last 72 hours. -------------------------------------------------------------------------------------------------------------------  Cardiac Enzymes Recent Labs  Lab 10/28/17 1138  TROPONINI <0.03   ------------------------------------------------------------------------------------------------------------------ Invalid input(s):  POCBNP  ---------------------------------------------------------------------------------------------------------------  Urinalysis    Component Value Date/Time   COLORURINE YELLOW (A) 06/22/2014 1634   APPEARANCEUR Clear 07/26/2016 1445   LABSPEC 1.020 06/22/2014 1634   LABSPEC 1.012 10/10/2011 1219   PHURINE 5.0 06/22/2014 1634   GLUCOSEU Negative 07/26/2016 1445   GLUCOSEU Negative 10/10/2011 1219   HGBUR NEGATIVE 06/22/2014 1634   BILIRUBINUR Negative 07/26/2016 1445   BILIRUBINUR Negative 10/10/2011 1219   KETONESUR NEGATIVE 06/22/2014 1634   PROTEINUR Negative 07/26/2016 1445   PROTEINUR 30 (A) 06/22/2014 1634   NITRITE Positive (A) 07/26/2016 1445   NITRITE NEGATIVE 06/22/2014 1634   LEUKOCYTESUR 3+ (A) 07/26/2016 1445   LEUKOCYTESUR 2+ 10/10/2011 1219     RADIOLOGY: Dg Chest 2 View  Result Date: 10/28/2017 CLINICAL DATA:  Cough, shortness of breath and generalized body aches over past few days, history of COPD, hypertension, GERD, pneumonia EXAM: CHEST - 2 VIEW COMPARISON:  02/12/2017 FINDINGS: Normal heart size, mediastinal contours, and pulmonary vascularity. Chronic bronchitic changes and hyperinflation. Atherosclerotic calcification aorta.  Increased markings at RIGHT base question infiltrate versus atelectasis. Remaining lungs clear. No pleural effusion or pneumothorax. Bones appear demineralized. IMPRESSION: Bronchitic changes and hyperinflation with increased markings at RIGHT base question infiltrate versus atelectasis. Electronically Signed   By: Lavonia Dana M.D.   On: 10/28/2017 12:07    EKG: Orders placed or performed during the hospital encounter of 10/28/17  . EKG 12-Lead  . EKG 12-Lead  . ED EKG  . ED EKG    IMPRESSION AND PLAN: Patient is a 67 year old with history of COPD presenting to the hospital with complaint of cough and shortness of breath  1.  Acute on chronic COPD exasperation we will treat with nebulizer steroids Oxygen therapy I will  place her on Mucinex I will place her on Pulmicort nebs  2.  Pneumonia we will check procalcitonin level Treat with IV ceftriaxone azithromycin Sputum cultures if possible  3.  Hypothyroidism continue Synthroid  4.  Depression continue home regimen  5.  Hypothyroidism resume blood pressure medications once medication reconciliation is done  6.  Miscellaneous Lovenox for DVT prophylaxis  7.  Nicotine abuse smoking cessation provided 4 minutes spent strongly recommend she stop smoking offered nicotine patch patient states that she does not want nicotine patch  All the records are reviewed and case discussed with ED provider. Management plans discussed with the patient, family and they are in agreement.  CODE STATUS: Code Status History    Date Active Date Inactive Code Status Order ID Comments User Context   12/17/2015 1415 12/20/2015 2046 Full Code [id number]  Epifanio Lesches, MD ED       TOTAL TIME TAKING CARE OF THIS PATIENT:55 minutes.    Dustin Flock M.D on 10/28/2017 at 1:54 PM  Between 7am to 6pm - Pager - [phone]  After 6pm go to [email or web address] - password [email or web address]  [facility] Office  [phone]  CC: Primary care physician; Mikey College, NP

## 2017-10-28 NOTE — ED Provider Notes (Addendum)
[facility] Emergency Department Provider Note       Time seen: ----------------------------------------- 11:57 AM on 10/28/2017 -----------------------------------------   I have reviewed the triage vital signs and the nursing notes.  HISTORY   Chief Complaint Shortness of Breath and Cough    HPI [name] is a 67 y.o. female with a history of anxiety, arthritis, chronic back pain, COPD, hypertension, pneumonia who presents to the ED for cough, shortness of breath and generalized body aches over the past few days.  Patient initially had oxygen levels around 90% but was placed on 2 L of nasal cannula and this improved to 92%.  She states she has a chronic cough but this cough is different, nonproductive.  Past Medical History:  Diagnosis Date  . Anxiety   . Arthritis   . Chronic back pain   . COPD (chronic obstructive pulmonary disease) ([place])   . Depression   . GERD (gastroesophageal reflux disease)   . Hypertension   . Hypothyroidism   . Pneumonia    RECENT X 3  . PONV (postoperative nausea and vomiting)   . Psoriasis   . Thyroid disease     Patient Active Problem List   Diagnosis Date Noted  . Thoracic aortic atherosclerosis ([place]) 02/12/2017  . Cystocele, midline 11/08/2016  . Vaginal atrophy 11/08/2016  . Chronic left hip pain 05/07/2016  . Insomnia 01/10/2016  . COPD (chronic obstructive pulmonary disease) with chronic bronchitis ([place]) 12/17/2015  . Bilateral carotid artery stenosis 08/22/2015  . Atherosclerotic peripheral vascular disease with intermittent claudication ([place]) 08/04/2015  . Benign essential HTN 08/04/2015  . Mixed hyperlipidemia 08/04/2015  . Abnormal finding on EKG 07/25/2015  . Gravida 2 para 2 07/01/2015  . Panic disorder without agoraphobia with moderate panic attacks 07/01/2015  . Prediabetes 06/09/2015  . Lichen sclerosus 84/13/2440  . Abdominal pain, LLQ 02/01/2015  . Chronic LBP 09/16/2014  . Clinical  depression 08/19/2014  . Adult hypothyroidism 08/19/2014  . Anxiety disorder 06/23/2014  . Other long term (current) drug therapy 08/05/2012  . Bladder retention 10/25/2008  . Lumbar canal stenosis 10/21/2008  . Current tobacco use 09/19/2008  . Degeneration of intervertebral disc of lumbosacral region 03/22/2008    Past Surgical History:  Procedure Laterality Date  . ABDOMINAL HYSTERECTOMY     heavy bleeding  . bladder stimulator    . CARPAL TUNNEL RELEASE    . CATARACT EXTRACTION W/PHACO Right 03/07/2015   Procedure: CATARACT EXTRACTION PHACO AND INTRAOCULAR LENS PLACEMENT (IOC);  Surgeon: Estill Cotta, MD;  Location: [facility] ORS;  Service: Ophthalmology;  Laterality: Right;  [name] 01:14 AP% 26.4 CDE 33.04 fluid pack lot # [id number]  . CATARACT EXTRACTION W/PHACO Left 03/21/2015   Procedure: CATARACT EXTRACTION PHACO AND INTRAOCULAR LENS PLACEMENT (IOC);  Surgeon: Estill Cotta, MD;  Location: [facility] ORS;  Service: Ophthalmology;  Laterality: Left;  [name] 01:21 AP% 25.6 CDE 39.78 fluid pack lot # [id number]  . HAND SURGERY Right   . INTERSTIM IMPLANT REMOVAL N/A 09/07/2015   Procedure: REMOVAL OF INTERSTIM IMPLANT;  Surgeon: Bjorn Loser, MD;  Location: [facility] ORS;  Service: Urology;  Laterality: N/A;  . OOPHORECTOMY Right    ovarian cyst    Allergies Ciprofloxacin; Oxycodone; Oxycodone-acetaminophen; Oxycodone-acetaminophen; and Sulfa antibiotics  Social History Social History   Tobacco Use  . Smoking status: Current Every Day Smoker    Packs/day: 1.00    Years: 35.00    Pack years: 35.00    Types: Cigarettes  . Smokeless tobacco: Never Used  .  Tobacco comment: Previously smoked 2 ppd  Substance Use Topics  . Alcohol use: No  . Drug use: No   Review of Systems Constitutional: Negative for fever. Cardiovascular: Negative for chest pain. Respiratory: Positive for cough and shortness of breath Gastrointestinal: Negative for abdominal pain, vomiting and  diarrhea. Musculoskeletal: Negative for back pain.  Positive for generalized body aches Skin: Negative for rash. Neurological: Negative for headaches, focal weakness or numbness.  All systems negative/normal/unremarkable except as stated in the HPI  ____________________________________________   PHYSICAL EXAM:  VITAL SIGNS: ED Triage Vitals  Enc Vitals Group     BP 10/28/17 1140 (!) 143/68     Pulse Rate 10/28/17 1140 88     Resp 10/28/17 1140 16     Temp 10/28/17 1140 98.7 F (37.1 C)     Temp Source 10/28/17 1140 Oral     SpO2 10/28/17 1140 92 %     Weight 10/28/17 1141 175 lb (79.4 kg)     Height 10/28/17 1141 5\' 2"  (1.575 m)     Head Circumference --      Peak Flow --      Pain Score --      Pain Loc --      Pain Edu? --      Excl. in [place]? --    Constitutional: Alert and oriented. Well appearing and in no distress. Eyes: Conjunctivae are normal. Normal extraocular movements. ENT   Head: Normocephalic and atraumatic.   Nose: No congestion/rhinnorhea.   Mouth/Throat: Mucous membranes are moist.   Neck: No stridor. Cardiovascular: Normal rate, regular rhythm. No murmurs, rubs, or gallops. Respiratory: Normal respiratory effort without tachypnea nor retractions. Breath sounds are clear and equal bilaterally. No wheezes/rales/rhonchi. Gastrointestinal: Soft and nontender. Normal bowel sounds Musculoskeletal: Nontender with normal range of motion in extremities. No lower extremity tenderness nor edema. Neurologic:  Normal speech and language. No gross focal neurologic deficits are appreciated.  Skin:  Skin is warm, dry and intact. No rash noted. Psychiatric: Mood and affect are normal. Speech and behavior are normal.  ____________________________________________  EKG: Interpreted by me.  Sinus rhythm the rate of 90 bpm, normal PR interval, normal QRS, normal QT  ____________________________________________  ED COURSE:  As part of my medical decision  making, I reviewed the following data within the [place] History obtained from family if available, nursing notes, old chart and ekg, as well as notes from prior ED visits. Patient presented for cough and shortness of breath, we will assess with labs and imaging as indicated at this time.   Procedures ____________________________________________   LABS (pertinent positives/negatives)  Labs Reviewed  CBC WITH DIFFERENTIAL/PLATELET - Abnormal; Notable for the following components:      Result Value   Neutro Abs 7.7 (*)    Monocytes Absolute 1.1 (*)    All other components within normal limits  BASIC METABOLIC PANEL - Abnormal; Notable for the following components:   Glucose, Bld 100 (*)    All other components within normal limits  BRAIN NATRIURETIC PEPTIDE  TROPONIN I    RADIOLOGY Images were viewed by me  Chest x-ray IMPRESSION: Bronchitic changes and hyperinflation with increased markings at RIGHT base question infiltrate versus atelectasis. ____________________________________________  DIFFERENTIAL DIAGNOSIS   COPD, pneumonia, MI, unstable angina, PE  FINAL ASSESSMENT AND PLAN  COPD exacerbation   Plan: The patient had presented for shortness of breath and cough. Patient's labs were reassuring. Patient's imaging did not reveal any obvious pneumonia, there is  a question of right basilar infiltrate versus atelectasis.  She was given IV Rocephin as well as azithromycin and will continue with Z-Pak at home as well as steroids and her breathing treatments.  Patient states she does not feel well enough to go home however, I will discuss with the hospitalist for observation.   Laurence Aly, MD   Note: This note was generated in part or whole with voice recognition software. Voice recognition is usually quite accurate but there are transcription errors that can and very often do occur. I apologize for any typographical errors that were not detected  and corrected.     Earleen Newport, MD 10/28/17 [place]    Earleen Newport, MD 10/28/17 1351

## 2017-10-28 NOTE — ED Notes (Signed)
Pt transported to 255

## 2017-10-28 NOTE — ED Notes (Signed)
Attempt to call report X 1 unsuccessful.  

## 2017-10-29 LAB — BASIC METABOLIC PANEL
ANION GAP: 13 (ref 5–15)
BUN: 17 mg/dL (ref 8–23)
CHLORIDE: 95 mmol/L — AB (ref 98–111)
CO2: 27 mmol/L (ref 22–32)
Calcium: 8.8 mg/dL — ABNORMAL LOW (ref 8.9–10.3)
Creatinine, Ser: 0.93 mg/dL (ref 0.44–1.00)
GFR calc Af Amer: >60 mL/min (ref >60)
Glucose, Bld: 268 mg/dL — ABNORMAL HIGH (ref 70–99)
POTASSIUM: 3.9 mmol/L (ref 3.5–5.1)
Sodium: 135 mmol/L (ref 135–145)

## 2017-10-29 LAB — GLUCOSE, CAPILLARY
Glucose-Capillary: 186 mg/dL — ABNORMAL HIGH (ref 70–99)
Glucose-Capillary: 191 mg/dL — ABNORMAL HIGH (ref 70–99)

## 2017-10-29 LAB — CBC
HEMATOCRIT: 40.6 % (ref 35.0–47.0)
HEMOGLOBIN: 13.4 g/dL (ref 12.0–16.0)
MCH: 30.4 pg (ref 26.0–34.0)
MCHC: 33.1 g/dL (ref 32.0–36.0)
MCV: 92 fL (ref 80.0–100.0)
PLATELETS: 269 10*3/uL (ref 150–440)
RBC: 4.41 MIL/uL (ref 3.80–5.20)
RDW: 13.8 % (ref 11.5–14.5)
WBC: 15.5 10*3/uL — AB (ref 3.6–11.0)

## 2017-10-29 LAB — HEMOGLOBIN A1C
Hgb A1c MFr Bld: 6 % — ABNORMAL HIGH (ref 4.8–5.6)
MEAN PLASMA GLUCOSE: 125.5 mg/dL

## 2017-10-29 LAB — MAGNESIUM: MAGNESIUM: 2.2 mg/dL (ref 1.7–2.4)

## 2017-10-29 MED ORDER — METHYLPREDNISOLONE SODIUM SUCC 125 MG IJ SOLR
60.0000 mg | Freq: Two times a day (BID) | INTRAMUSCULAR | Status: AC
  Administered 2017-10-29: 60 mg via INTRAVENOUS
  Filled 2017-10-29: qty 2

## 2017-10-29 MED ORDER — INSULIN ASPART 100 UNIT/ML ~~LOC~~ SOLN: 0.0000 [IU] | Freq: Every day | SUBCUTANEOUS | Status: DC

## 2017-10-29 MED ORDER — INSULIN ASPART 100 UNIT/ML ~~LOC~~ SOLN
0.0000 [IU] | Freq: Three times a day (TID) | SUBCUTANEOUS | Status: DC
  Administered 2017-10-29 – 2017-10-30 (×2): 2 [IU] via SUBCUTANEOUS
  Filled 2017-10-29 (×2): qty 1

## 2017-10-29 MED ORDER — PREDNISONE 20 MG PO TABS
40.0000 mg | ORAL_TABLET | Freq: Every day | ORAL | Status: DC
  Administered 2017-10-30: 40 mg via ORAL
  Filled 2017-10-29: qty 2

## 2017-10-29 NOTE — Progress Notes (Signed)
Inpatient Diabetes Program Recommendations  AACE/ADA: New Consensus Statement on Inpatient Glycemic Control (2019)  Target Ranges:  Prepandial:   less than 140 mg/dL      Peak postprandial:   less than 180 mg/dL (1-2 hours)      Critically ill patients:  140 - 180 mg/dL   Results for [name], Jill [name] "[name]" (MRN [id number]) as of 10/29/2017 11:18  Ref. Range 10/28/2017 20:58  Glucose-Capillary Latest Ref Range: 70 - 99 mg/dL 265 (H)  Results for [name], Jill [name] "[name]" (MRN [id number]) as of 10/29/2017 11:18  Ref. Range 10/28/2017 11:38 10/29/2017 05:33  Glucose Latest Ref Range: 70 - 99 mg/dL 100 (H) 268 (H)   Review of Glycemic Control  Diabetes history: Pre-DM (per H&P) Outpatient Diabetes medications: NA Current orders for Inpatient glycemic control: None; Solumedrol 60 mg Q6H  Inpatient Diabetes Program Recommendations: Correction (SSI): While inpatient and ordered steroids, please consider ordering CBGs with Novolog correction scale ACHS.  Thanks, Barnie Alderman, RN, MSN, CDE Diabetes Coordinator Inpatient Diabetes Program [phone] (Team Pager from 8am to 5pm)

## 2017-10-29 NOTE — Progress Notes (Signed)
[facility] [place] NAME: [name]    MR#:  [id number]  DATE OF BIRTH:  [birth year]  SUBJECTIVE:  CHIEF COMPLAINT:   Chief Complaint  Patient presents with  . Shortness of Breath  . Cough   Still cough and shortness of breath, on oxygen by nasal cannula 3 L this morning.  Feels anxious. REVIEW OF SYSTEMS:  Review of Systems  Constitutional: Negative for chills, fever and malaise/fatigue.  HENT: Negative for sore throat.   Eyes: Negative for blurred vision and double vision.  Respiratory: Positive for cough and shortness of breath. Negative for hemoptysis, sputum production, wheezing and stridor.   Cardiovascular: Negative for chest pain, palpitations, orthopnea and leg swelling.  Gastrointestinal: Negative for abdominal pain, blood in stool, diarrhea, melena, nausea and vomiting.  Genitourinary: Negative for dysuria, flank pain and hematuria.  Musculoskeletal: Negative for back pain and joint pain.  Skin: Negative for rash.  Neurological: Negative for dizziness, sensory change, focal weakness, seizures, loss of consciousness, weakness and headaches.  Endo/Heme/Allergies: Negative for polydipsia.  Psychiatric/Behavioral: Negative for depression. The patient is nervous/anxious.     DRUG ALLERGIES:   Allergies  Allergen Reactions  . Ciprofloxacin Hives and Diarrhea  . Oxycodone Diarrhea  . Oxycodone-Acetaminophen Diarrhea  . Oxycodone-Acetaminophen Diarrhea  . Sulfa Antibiotics Rash    Throat, mouth Throat, mouth   VITALS:  Blood pressure 111/60, pulse 85, temperature 98.3 F (36.8 C), temperature source Oral, resp. rate 19, height 5\' 2"  (1.575 m), weight 77.8 kg, SpO2 95 %. PHYSICAL EXAMINATION:  Physical Exam  Constitutional: She is oriented to person, place, and time. She appears well-developed.  Obesity.  HENT:  Head: Normocephalic.  Mouth/Throat: Oropharynx is clear and moist.  Eyes: Pupils are equal, round, and  reactive to light. Conjunctivae and EOM are normal. No scleral icterus.  Neck: Normal range of motion. Neck supple. No JVD present. No tracheal deviation present.  Cardiovascular: Normal rate, regular rhythm and normal heart sounds. Exam reveals no gallop.  No murmur heard. Pulmonary/Chest: Effort normal. No stridor. No respiratory distress. She has wheezes. She has no rales.  Abdominal: Soft. Bowel sounds are normal. She exhibits no distension. There is no tenderness. There is no rebound.  Musculoskeletal: Normal range of motion. She exhibits no edema or tenderness.  Neurological: She is alert and oriented to person, place, and time. No cranial nerve deficit.  Skin: No rash noted. No erythema.  Psychiatric: She has a normal mood and affect.   LABORATORY PANEL:  Female CBC Recent Labs  Lab 10/29/17 0533  WBC 15.5*  HGB 13.4  HCT 40.6  PLT 269   ------------------------------------------------------------------------------------------------------------------ Chemistries  Recent Labs  Lab 10/29/17 0533  NA 135  K 3.9  CL 95*  CO2 27  GLUCOSE 268*  BUN 17  CREATININE 0.93  CALCIUM 8.8*   RADIOLOGY:  No results found. ASSESSMENT AND PLAN:   Patient is a 67 year old with history of COPD presenting to the hospital with complaint of cough and shortness of breath  1.  COPD exacerbation. Taper IV Solu-Medrol, changed to p.o. prednisone tomorrow, continue nebulizer, Mucinex and Pulmicort nebs  2.  Pneumonia  Continue IV ceftriaxone and azithromycin.  3.  Hypothyroidism continue Synthroid  4.  Depression continue home regimen  5.    Hypertension.  Continue home hypertension medication.  6.  Miscellaneous Lovenox for DVT prophylaxis  7.  Tobacco abuse. Smoking cessation was counseled for 3-4 minutes.  Patient wants to quit.  Leukocytosis.  Possible due to steroids. Elevated blood glucose.  Possible due to steroid, start sliding scale.  All the records are  reviewed and case discussed with Care Management/Social Worker. Management plans discussed with the patient, family and they are in agreement.  CODE STATUS: Full Code  TOTAL TIME TAKING CARE OF THIS PATIENT: 36 minutes.   More than 50% of the time was spent in counseling/coordination of care: YES  POSSIBLE D/C IN 2 DAYS, DEPENDING ON CLINICAL CONDITION.   Demetrios Loll M.D on 10/29/2017 at 3:48 PM  Between 7am to 6pm - Pager - [phone]  After 6pm go to [email or web address] - [facility]

## 2017-10-29 NOTE — Plan of Care (Signed)
  Problem: Activity: Goal: Risk for activity intolerance will decrease Outcome: Progressing Note:  Up to bathroom with standby assist, tolerating well   Problem: Nutrition: Goal: Adequate nutrition will be maintained Outcome: Progressing   Problem: Elimination: Goal: Will not experience complications related to urinary retention Outcome: Progressing   Problem: Pain Managment: Goal: General experience of comfort will improve Outcome: Progressing Note:  Pt has chronic back pain, treated once with morphine, which eased it off some   Problem: Safety: Goal: Ability to remain free from injury will improve Outcome: Progressing   Problem: Skin Integrity: Goal: Risk for impaired skin integrity will decrease Outcome: Progressing   Problem: Education: Goal: Knowledge of General Education information will improve Description Including pain rating scale, medication(s)/side effects and non-pharmacologic comfort measures Outcome: Completed/Met

## 2017-10-30 LAB — GLUCOSE, CAPILLARY: GLUCOSE-CAPILLARY: 151 mg/dL — AB (ref 70–99)

## 2017-10-30 MED ORDER — AZITHROMYCIN 250 MG PO TABS: 250.0000 mg | ORAL_TABLET | Freq: Every day | ORAL | 0 refills | Status: DC

## 2017-10-30 NOTE — Discharge Instructions (Signed)
Smoking cessation  

## 2017-10-30 NOTE — Plan of Care (Signed)
  Problem: Clinical Measurements: Goal: Respiratory complications will improve Outcome: Progressing   Problem: Activity: Goal: Risk for activity intolerance will decrease Outcome: Progressing Note:  Up independently in room, tolerating well   Problem: Coping: Goal: Level of anxiety will decrease Outcome: Progressing   Problem: Pain Managment: Goal: General experience of comfort will improve Outcome: Progressing Note:  Continues to have back pain, but chronic, treated once with morphine   Problem: Safety: Goal: Ability to remain free from injury will improve Outcome: Progressing   Problem: Skin Integrity: Goal: Risk for impaired skin integrity will decrease Outcome: Progressing

## 2017-10-30 NOTE — Progress Notes (Signed)
IV removed from patient. Discharge instructions given to patient. Verbalized understanding. No acute distress. Transported via wheelchair to visitors entrance to meet ride.

## 2017-10-30 NOTE — Discharge Summary (Signed)
[facility] at [place] NAME: [name]    MR#:  [id number]  DATE OF BIRTH:  February 23, 1950  DATE OF ADMISSION:  10/28/2017   ADMITTING PHYSICIAN: Dustin Flock, MD  DATE OF DISCHARGE: 10/30/2017 10:51 AM  PRIMARY CARE PHYSICIAN: Mikey College, NP   ADMISSION DIAGNOSIS:  COPD exacerbation ([place]) [J44.1] DISCHARGE DIAGNOSIS:  Active Problems:   COPD with acute exacerbation ([place])  SECONDARY DIAGNOSIS:   Past Medical History:  Diagnosis Date  . Anxiety   . Arthritis   . Chronic back pain   . COPD (chronic obstructive pulmonary disease) ([place])   . Depression   . GERD (gastroesophageal reflux disease)   . Hypertension   . Hypothyroidism   . Pneumonia    RECENT X 3  . PONV (postoperative nausea and vomiting)   . Psoriasis   . Thyroid disease    HOSPITAL COURSE:  Patient is a 67 year old with history of COPD presenting to the hospital with complaint of cough and shortness of breath  1.COPD exacerbation. She was on IV Solu-Medrol, changed to p.o. Prednisone.  But she feels anxious and does not want to continue prednisone.  She has been treated with nebulizer, Mucinex and Pulmicort nebs  2.Pneumonia  She was found IV ceftriaxone and azithromycin.  Changed to Zithromax for 4 more days.  3.Hypothyroidism continue Synthroid  4.Depression continue home regimen  5.  Hypertension.  Continue home hypertension medication.  6.Miscellaneous Lovenox for DVT prophylaxis  7.Tobacco abuse. Smoking cessation was counseled for 3-4 minutes.  Patient wants to quit.  Leukocytosis.  Possible due to steroids. Elevated blood glucose.  Possible due to steroid, she was on sliding scale. A1C 6.0.  DISCHARGE CONDITIONS:  Stable, discharged to home today. CONSULTS OBTAINED:   DRUG ALLERGIES:   Allergies  Allergen Reactions  . Ciprofloxacin Hives and Diarrhea  . Oxycodone Diarrhea  . Oxycodone-Acetaminophen Diarrhea    . Oxycodone-Acetaminophen Diarrhea  . Sulfa Antibiotics Rash    Throat, mouth Throat, mouth   DISCHARGE MEDICATIONS:   Allergies as of 10/30/2017      Reactions   Ciprofloxacin Hives, Diarrhea   Oxycodone Diarrhea   Oxycodone-acetaminophen Diarrhea   Oxycodone-acetaminophen Diarrhea   Sulfa Antibiotics Rash   Throat, mouth Throat, mouth      Medication List    TAKE these medications   acetaminophen 500 MG tablet Commonly known as:  TYLENOL Take 500 mg by mouth every 6 (six) hours as needed for moderate pain.   albuterol 108 (90 Base) MCG/ACT inhaler Commonly known as:  PROVENTIL HFA;VENTOLIN HFA Inhale 2 puffs into the lungs every 6 (six) hours as needed for wheezing or shortness of breath.   azithromycin 250 MG tablet Commonly known as:  ZITHROMAX Take 1 tablet (250 mg total) by mouth daily.   budesonide-formoterol 160-4.5 MCG/ACT inhaler Commonly known as:  SYMBICORT Inhale 2 puffs into the lungs 2 (two) times daily.   busPIRone 7.5 MG tablet Commonly known as:  BUSPAR Take 1 tablet (7.5 mg total) by mouth 3 (three) times daily. What changed:  when to take this   citalopram 40 MG tablet Commonly known as:  CELEXA Take 1 tablet (40 mg total) by mouth daily.   clonazePAM 1 MG tablet Commonly known as:  KLONOPIN See admin instructions. Take 1 tablet (1MG ) by mouth every morning,  tablet (0.5MG ) every afternoon and 1 tablet (1MG ) by mouth every night   clonazePAM 1 MG tablet Commonly known as:  KLONOPIN Take  1/2 tab (0.5 mg total) in am, 1 tablet (1 mg total) in afternoon, 1 tablet (1 mg total) at bedtime.   clotrimazole-betamethasone cream Commonly known as:  LOTRISONE Apply 1 application topically 2 (two) times daily.   COSENTYX 150 MG/ML [place] Generic drug:  Secukinumab Inject 150 mg into the skin every 14 (fourteen) days. 2 injections per month   diltiazem 120 MG 24 hr capsule Commonly known as:  CARDIZEM CD Take 1 capsule (120 mg total) by mouth  daily.   gabapentin 300 MG capsule Commonly known as:  NEURONTIN See admin instructions. Take 1 capsule (300MG ) by mouth every morning and 2 capsules (600MG ) by mouth every night   levothyroxine 125 MCG tablet Commonly known as:  SYNTHROID, LEVOTHROID Take 1 tablet (125 mcg total) by mouth daily with breakfast.   lisinopril 10 MG tablet Commonly known as:  PRINIVIL,ZESTRIL Take 1 tablet (10 mg total) by mouth daily.   mirabegron ER 25 MG Tb24 tablet Commonly known as:  MYRBETRIQ Take 1 tablet (25 mg total) by mouth daily.   Quad Cane/Small Base Misc 1 Device by Does not apply route daily.   triamcinolone 0.025 % ointment Commonly known as:  KENALOG Apply 1 application topically as directed.        DISCHARGE INSTRUCTIONS:  See AVS.  If you experience worsening of your admission symptoms, develop shortness of breath, life threatening emergency, suicidal or homicidal thoughts you must seek medical attention immediately by calling 911 or calling your MD immediately  if symptoms less severe.  You Must read complete instructions/literature along with all the possible adverse reactions/side effects for all the Medicines you take and that have been prescribed to you. Take any new Medicines after you have completely understood and accpet all the possible adverse reactions/side effects.   Please note  You were cared for by a hospitalist during your hospital stay. If you have any questions about your discharge medications or the care you received while you were in the hospital after you are discharged, you can call the unit and asked to speak with the hospitalist on call if the hospitalist that took care of you is not available. Once you are discharged, your primary care physician will handle any further medical issues. Please note that NO REFILLS for any discharge medications will be authorized once you are discharged, as it is imperative that you return to your primary care physician (or  establish a relationship with a primary care physician if you do not have one) for your aftercare needs so that they can reassess your need for medications and monitor your lab values.    On the day of Discharge:  VITAL SIGNS:  Blood pressure 108/60, pulse 73, temperature 98.1 F (36.7 C), temperature source Oral, resp. rate 16, height 5\' 2"  (1.575 m), weight 77.8 kg, SpO2 93 %. PHYSICAL EXAMINATION:  GENERAL:  67 y.o.-year-old patient lying in the bed with no acute distress.  EYES: Pupils equal, round, reactive to light and accommodation. No scleral icterus. Extraocular muscles intact.  HEENT: Head atraumatic, normocephalic. Oropharynx and nasopharynx clear.  NECK:  Supple, no jugular venous distention. No thyroid enlargement, no tenderness.  LUNGS: Normal breath sounds bilaterally, no wheezing, rales,rhonchi or crepitation. No use of accessory muscles of respiration.  CARDIOVASCULAR: S1, S2 normal. No murmurs, rubs, or gallops.  ABDOMEN: Soft, non-tender, non-distended. Bowel sounds present. No organomegaly or mass.  EXTREMITIES: No pedal edema, cyanosis, or clubbing.  NEUROLOGIC: Cranial nerves II through XII are intact. Muscle strength  5/5 in all extremities. Sensation intact. Gait not checked.  PSYCHIATRIC: The patient is alert and oriented x 3.  SKIN: No obvious rash, lesion, or ulcer.  DATA REVIEW:   CBC Recent Labs  Lab 10/29/17 0533  WBC 15.5*  HGB 13.4  HCT 40.6  PLT 269    Chemistries  Recent Labs  Lab 10/29/17 0533  NA 135  K 3.9  CL 95*  CO2 27  GLUCOSE 268*  BUN 17  CREATININE 0.93  CALCIUM 8.8*  MG 2.2     Microbiology Results  Results for orders placed or performed in visit on 07/26/16  Microscopic Examination     Status: Abnormal   Collection Time: 07/26/16  2:45 PM  Result Value Ref Range Status   WBC, UA 11-30W 0 - 5 /hpf Final   RBC, UA None seen 0 - 2 /hpf Final   Epithelial Cells (non renal) 0-10 0 - 10 /hpf Final   Bacteria, UA Many (A)  None seen/Few Final  CULTURE, URINE COMPREHENSIVE     Status: Abnormal   Collection Time: 07/26/16  3:04 PM  Result Value Ref Range Status   Urine Culture, Comprehensive Final report (A)  Final   Organism ID, Bacteria Klebsiella pneumoniae (A)  Final    Comment: Greater than 100,000 colony forming units per mL Cefazolin <=4 ug/mL Cefazolin with an MIC <=16 predicts susceptibility to the oral agents cefaclor, cefdinir, cefpodoxime, cefprozil, cefuroxime, cephalexin, and loracarbef when used for therapy of uncomplicated urinary tract infections due to E. coli, Klebsiella pneumoniae, and Proteus mirabilis.    ANTIMICROBIAL SUSCEPTIBILITY Comment  Final    Comment:       ** S = Susceptible; I = Intermediate; R = Resistant **                    P = Positive; N = Negative             MICS are expressed in micrograms per mL    Antibiotic                 RSLT#1    RSLT#2    RSLT#3    RSLT#4 Amoxicillin/Clavulanic Acid    S Ampicillin                     R Cefepime                       S Ceftriaxone                    S Cefuroxime                     S Ciprofloxacin                  S Ertapenem                      S Gentamicin                     S Imipenem                       S Levofloxacin                   S Meropenem                      S  Nitrofurantoin                 S Piperacillin/Tazobactam        S Tetracycline                   S Tobramycin                     S Trimethoprim/Sulfa             S     RADIOLOGY:  No results found.   Management plans discussed with the patient, family and they are in agreement.  CODE STATUS: Prior   TOTAL TIME TAKING CARE OF THIS PATIENT: 33 minutes.    Demetrios Loll M.D on 10/30/2017 at 3:49 PM  Between 7am to 6pm - Pager - [phone]  After 6pm go to [email or web address] - password [email or web address] [facility]  [facility]  Office  [phone]  CC: Primary care physician; Mikey College, NP   Note: This dictation  was prepared with Dragon dictation along with smaller phrase technology. Any transcriptional errors that result from this process are unintentional.

## 2017-11-01 ENCOUNTER — Telehealth: Payer: Self-pay

## 2017-11-01 NOTE — Telephone Encounter (Signed)
I have made the 2nd attempt to contact the patient or family member in charge, in order to follow up from recently being discharged from the hospital.  

## 2017-11-01 NOTE — Telephone Encounter (Signed)
I have made the 1st attempt to contact the patient or family member in charge, in order to follow up from recently being discharged from the hospital. I was unable to leave a message on voicemail but I will make another attempt at a different time.    Direct call back- [phone]

## 2017-11-04 ENCOUNTER — Ambulatory Visit (INDEPENDENT_AMBULATORY_CARE_PROVIDER_SITE_OTHER): Payer: PPO | Admitting: Nurse Practitioner

## 2017-11-04 ENCOUNTER — Other Ambulatory Visit: Payer: Self-pay

## 2017-11-04 ENCOUNTER — Encounter: Payer: Self-pay | Admitting: Nurse Practitioner

## 2017-11-04 VITALS — BP 119/65 | HR 80 | Temp 98.2°F | Resp 18 | Ht 62.0 in | Wt 172.6 lb

## 2017-11-04 DIAGNOSIS — J449 Chronic obstructive pulmonary disease, unspecified: Secondary | ICD-10-CM | POA: Diagnosis not present

## 2017-11-04 DIAGNOSIS — Z09 Encounter for follow-up examination after completed treatment for conditions other than malignant neoplasm: Secondary | ICD-10-CM

## 2017-11-04 DIAGNOSIS — F410 Panic disorder [episodic paroxysmal anxiety] without agoraphobia: Secondary | ICD-10-CM

## 2017-11-04 MED ORDER — CLONAZEPAM 1 MG PO TABS: ORAL_TABLET | ORAL | 2 refills | Status: DC

## 2017-11-04 NOTE — Progress Notes (Signed)
Subjective:    Patient ID: [name], female    DOB: [birth year], 67 y.o.   MRN: [id number]  [name] is a 67 y.o. female presenting on 11/04/2017 for Hospitalization Follow-up (COPD exacerbation, Pneumonia )   HPI HOSPITAL FOLLOW-UP VISIT Hospital/Location: [facility] Date of Admission: 10/28/2017 Date of Discharge: 10/30/2017 Transitions of care telephone call: 11/01/2017  Reason for Admission: COPD exacerbation Primary (+Secondary) Diagnosis: COPD exacerbation (pneumonia)  FOLLOW-UP  - Hospital H&P and Discharge Summary have been reviewed - Patient presents today 5 days after recent hospitalization. Brief summary of recent course, patient had symptoms of  Shortness of breath, coughing, chills, fever for about 3 days prior to seeking care at the ED.  She was hospitalized, treated with nebs, steroids, mucinex, and antibiotics. - New medications on discharge: Zithromax x 4 days - Changes to current meds on discharge: none  - Today reports overall has done well after discharge. Symptoms of shortness of breath have resolved.  She continues to feel significant weakness.  Is staying active and is slowly regaining strength.  Is concerned about getting her back surgery rescheduled again as she missed this due to her hospitalization.  Anxiety Clonazepam dose stable and patient is having good anxiety response/control with current dosing of 1 mg in am, 0.5mg  in afternoon, 1 mg at bedtime.  Patient continues to have some anxiety and panic and feels she is unable to decrease this dose at this time.  Social History   Tobacco Use  . Smoking status: Current Every Day Smoker    Packs/day: 1.00    Years: 35.00    Pack years: 35.00    Types: Cigarettes  . Smokeless tobacco: Never Used  . Tobacco comment: Previously smoked 2 ppd  Substance Use Topics  . Alcohol use: No  . Drug use: No    Review of Systems Per HPI unless specifically indicated above  Outpatient Encounter Medications as  of 11/04/2017  Medication Sig  . acetaminophen (TYLENOL) 500 MG tablet Take 500 mg by mouth every 6 (six) hours as needed for moderate pain.   Marland Kitchen albuterol (PROVENTIL HFA;VENTOLIN HFA) 108 (90 Base) MCG/ACT inhaler Inhale 2 puffs into the lungs every 6 (six) hours as needed for wheezing or shortness of breath.  . budesonide-formoterol (SYMBICORT) 160-4.5 MCG/ACT inhaler Inhale 2 puffs into the lungs 2 (two) times daily.  . busPIRone (BUSPAR) 7.5 MG tablet Take 1 tablet (7.5 mg total) by mouth 3 (three) times daily.  . citalopram (CELEXA) 40 MG tablet Take 1 tablet (40 mg total) by mouth daily.  . clonazePAM (KLONOPIN) 1 MG tablet Take 1 tablet (1MG ) by mouth every morning, 1/2 tablet (0.5MG ) every afternoon and 1 tablet (1MG ) by mouth every night  . clotrimazole-betamethasone (LOTRISONE) cream Apply 1 application topically 2 (two) times daily.  Marland Kitchen diltiazem (CARDIZEM CD) 120 MG 24 hr capsule Take 1 capsule (120 mg total) by mouth daily.  Marland Kitchen gabapentin (NEURONTIN) 300 MG capsule See admin instructions. Take 1 capsule (300MG ) by mouth every morning and 2 capsules (600MG ) by mouth every night  . levothyroxine (SYNTHROID, LEVOTHROID) 125 MCG tablet Take 1 tablet (125 mcg total) by mouth daily with breakfast.  . lisinopril (PRINIVIL,ZESTRIL) 10 MG tablet Take 1 tablet (10 mg total) by mouth daily.  . Misc. Devices (QUAD CANE/SMALL BASE) MISC 1 Device by Does not apply route daily.  . Secukinumab (COSENTYX) 150 MG/ML SOSY Inject 150 mg into the skin every 14 (fourteen) days. 2 injections per month  .  triamcinolone (KENALOG) 0.025 % ointment Apply 1 application topically as directed.   . [DISCONTINUED] clonazePAM (KLONOPIN) 1 MG tablet See admin instructions. Take 1 tablet (1MG ) by mouth every morning,  tablet (0.5MG ) every afternoon and 1 tablet (1MG ) by mouth every night  . [DISCONTINUED] azithromycin (ZITHROMAX Z-PAK) 250 MG tablet Take 1 tablet (250 mg total) by mouth daily. (Patient not taking: Reported  on 11/04/2017)  . [DISCONTINUED] clonazePAM (KLONOPIN) 1 MG tablet Take 1/2 tab (0.5 mg total) in am, 1 tablet (1 mg total) in afternoon, 1 tablet (1 mg total) at bedtime.  . [DISCONTINUED] mirabegron ER (MYRBETRIQ) 25 MG TB24 tablet Take 1 tablet (25 mg total) by mouth daily. (Patient not taking: Reported on 11/04/2017)   No facility-administered encounter medications on file as of 11/04/2017.       Objective:    BP 119/65 (BP Location: Right Arm, Patient Position: Sitting, Cuff Size: Normal)   Pulse 80   Temp 98.2 F (36.8 C) (Oral)   Resp 18   Ht 5\' 2"  (1.575 m)   Wt 172 lb 9.6 oz (78.3 kg)   SpO2 97%   BMI 31.57 kg/m   Wt Readings from Last 3 Encounters:  11/04/17 172 lb 9.6 oz (78.3 kg)  10/28/17 171 lb 9.6 oz (77.8 kg)  09/27/17 175 lb 12.8 oz (79.7 kg)    Physical Exam  Constitutional: She is oriented to person, place, and time. She appears well-developed and well-nourished. No distress.  HENT:  Head: Normocephalic and atraumatic.  Cardiovascular: Normal rate, regular rhythm, S1 normal, S2 normal, normal heart sounds and intact distal pulses.  Pulmonary/Chest: Effort normal. No respiratory distress. She has decreased breath sounds (throughout all lobes). She has no wheezes. She has no rhonchi. She has no rales.  Neurological: She is alert and oriented to person, place, and time.  Skin: Skin is warm and dry. Capillary refill takes less than 2 seconds.  Psychiatric: She has a normal mood and affect. Her speech is normal and behavior is normal. Judgment and thought content normal. Cognition and memory are normal.  Vitals reviewed.   Results for orders placed or performed during the hospital encounter of 10/28/17  CBC with Differential  Result Value Ref Range   WBC 10.8 3.6 - 11.0 K/uL   RBC 4.87 3.80 - 5.20 MIL/uL   Hemoglobin 15.2 12.0 - 16.0 g/dL   HCT 44.8 35.0 - 47.0 %   MCV 91.9 80.0 - 100.0 fL   MCH 31.2 26.0 - 34.0 pg   MCHC 33.9 32.0 - 36.0 g/dL   RDW 13.9 11.5 -  14.5 %   Platelets 283 150 - 440 K/uL   Neutrophils Relative % 70 %   Neutro Abs 7.7 (H) 1.4 - 6.5 K/uL   Lymphocytes Relative 16 %   Lymphs Abs 1.7 1.0 - 3.6 K/uL   Monocytes Relative 10 %   Monocytes Absolute 1.1 (H) 0.2 - 0.9 K/uL   Eosinophils Relative 3 %   Eosinophils Absolute 0.3 0 - 0.7 K/uL   Basophils Relative 1 %   Basophils Absolute 0.1 0 - 0.1 K/uL  Basic metabolic panel  Result Value Ref Range   Sodium 138 135 - 145 mmol/L   Potassium 4.0 3.5 - 5.1 mmol/L   Chloride 98 98 - 111 mmol/L   CO2 29 22 - 32 mmol/L   Glucose, Bld 100 (H) 70 - 99 mg/dL   BUN 11 8 - 23 mg/dL   Creatinine, Ser 0.88 0.44 - 1.00  mg/dL   Calcium 8.9 8.9 - 10.3 mg/dL   GFR calc non Af Amer >60 >60 mL/min   GFR calc Af Amer >60 >60 mL/min   Anion gap 11 5 - 15  Brain natriuretic peptide  Result Value Ref Range   B Natriuretic Peptide 45.0 0.0 - 100.0 pg/mL  Troponin I  Result Value Ref Range   Troponin I <0.03 <0.03 ng/mL  CBC  Result Value Ref Range   WBC 15.5 (H) 3.6 - 11.0 K/uL   RBC 4.41 3.80 - 5.20 MIL/uL   Hemoglobin 13.4 12.0 - 16.0 g/dL   HCT 40.6 35.0 - 47.0 %   MCV 92.0 80.0 - 100.0 fL   MCH 30.4 26.0 - 34.0 pg   MCHC 33.1 32.0 - 36.0 g/dL   RDW 13.8 11.5 - 14.5 %   Platelets 269 150 - 440 K/uL  Basic metabolic panel  Result Value Ref Range   Sodium 135 135 - 145 mmol/L   Potassium 3.9 3.5 - 5.1 mmol/L   Chloride 95 (L) 98 - 111 mmol/L   CO2 27 22 - 32 mmol/L   Glucose, Bld 268 (H) 70 - 99 mg/dL   BUN 17 8 - 23 mg/dL   Creatinine, Ser 0.93 0.44 - 1.00 mg/dL   Calcium 8.8 (L) 8.9 - 10.3 mg/dL   GFR calc non Af Amer >60 >60 mL/min   GFR calc Af Amer >60 >60 mL/min   Anion gap 13 5 - 15  Glucose, capillary  Result Value Ref Range   Glucose-Capillary 265 (H) 70 - 99 mg/dL   Comment 1 Notify RN    Comment 2 Document in Chart   Hemoglobin A1c  Result Value Ref Range   Hgb A1c MFr Bld 6.0 (H) 4.8 - 5.6 %   Mean Plasma Glucose 125.5 mg/dL  Magnesium  Result Value Ref  Range   Magnesium 2.2 1.7 - 2.4 mg/dL  Glucose, capillary  Result Value Ref Range   Glucose-Capillary 186 (H) 70 - 99 mg/dL   Comment 1 Notify RN    Comment 2 Document in Chart   Glucose, capillary  Result Value Ref Range   Glucose-Capillary 191 (H) 70 - 99 mg/dL   Comment 1 Notify RN    Comment 2 Document in Chart   Glucose, capillary  Result Value Ref Range   Glucose-Capillary 151 (H) 70 - 99 mg/dL   Comment 1 Notify RN    Comment 2 Document in Chart       Assessment & Plan:   Problem List Items Addressed This Visit      Respiratory   COPD (chronic obstructive pulmonary disease) with chronic bronchitis (HCC) (Chronic) Improved today on exam after hospital discharge.  Patient has stable symptoms without any ongoing evidence of exacerbation as it is now resolved.  Medications tolerated without side effects.  Continue Symbicort 2 puffs bid and albuterol prn..  Refills provided. Patient offered opportunity for [facility] referral and COPD program was denied today. Followup 3 months.      Other   Anxiety disorder (Chronic) Stable with ongoing anxiety well controlled at this time.  Patient is taking 2.5 mg daily.  Not able to reduce at this time per patient report.  Encouraged patient to consider reducing to 1/2 tab at one additional dose per day for several days over next month.  Discussed tolerance to benzos and need to continue to attempt to reduce dose when feeling well so we have ability to modulate dose for  symptoms in future.  Patient verbalizes understanding.  Will consider reducing tab # from 75 per fill to 70 per fill at next prescription.  Followup 3 months.   Relevant Medications   clonazePAM (KLONOPIN) 1 MG tablet    Other Visit Diagnoses    Hospital discharge follow-up    -  Primary See AP COPD above.      Meds ordered this encounter  Medications  . clonazePAM (KLONOPIN) 1 MG tablet    Sig: Take 1 tablet (1MG ) by mouth every morning, 1/2 tablet (0.5MG ) every afternoon and  1 tablet (1MG ) by mouth every night    Dispense:  75 tablet    Refill:  2    Order Specific Question:   Supervising Provider    Answer:   Olin Hauser [[id number]]   I have reviewed the admission H&P, hospital notes, discharge summary, discharge medication list, and have reconciled the current and discharge medications today.  Follow up plan: Return in about 3 months (around 02/03/2018) for anxiety.  Cassell Smiles, DNP, AGPCNP-BC Adult Gerontology Primary Care Nurse Practitioner [facility] 11/04/2017, 10:15 AM

## 2017-11-04 NOTE — Patient Instructions (Addendum)
[name] "[name]",   Thank you for coming in to clinic today.   1. Continue inhalers without changes.  2. Call us if you have any worsening of your breathing, coughing, wheezing.  We want to treat you at home if possible.   Please schedule a follow-up appointment with Cassell Smiles, AGNP. Return in about 3 months (around 02/03/2018) for anxiety.  If you have any other questions or concerns, please feel free to call the clinic or send a message through [place]. You may also schedule an earlier appointment if necessary.  You will receive a survey after today's visit either digitally by e-mail or paper by [employer]. Your experiences and feedback matter to [name].  Please respond so we know how we are doing as we provide care for you.   Cassell Smiles, DNP, AGNP-BC Adult Gerontology Nurse Practitioner [facility], [place]   Chronic Obstructive Pulmonary Disease Exacerbation Chronic obstructive pulmonary disease (COPD) is a common lung problem. In COPD, the flow of air from the lungs is limited. COPD exacerbations are times that breathing gets worse and you need extra treatment. Without treatment they can be life threatening. If they happen often, your lungs can become more damaged. If your COPD gets worse, your doctor may treat you with:  Medicines.  Oxygen.  Different ways to clear your airway, such as using a mask.  Follow these instructions at home:  Do not smoke.  Avoid tobacco smoke and other things that bother your lungs.  If given, take your antibiotic medicine as told. Finish the medicine even if you start to feel better.  Only take medicines as told by your doctor.  Drink enough fluids to keep your pee (urine) clear or pale yellow (unless your doctor has told you not to).  Use a cool mist machine (vaporizer).  If you use oxygen or a machine that turns liquid medicine into a mist (nebulizer), continue to use them as told.  Keep up with shots  (vaccinations) as told by your doctor.  Exercise regularly.  Eat healthy foods.  Keep all doctor visits as told. Get help right away if:  You are very short of breath and it gets worse.  You have trouble talking.  You have bad chest pain.  You have blood in your spit (sputum).  You have a fever.  You keep throwing up (vomiting).  You feel weak, or you pass out (faint).  You feel confused.  You keep getting worse. This information is not intended to replace advice given to you by your health care provider. Make sure you discuss any questions you have with your health care provider. Document Released: 01/11/2011 Document Revised: 06/30/2015 Document Reviewed: 09/26/2012 Elsevier Interactive Patient Education  2017 [employer].

## 2017-11-04 NOTE — Patient Outreach (Signed)
[facility]) Care Management  11/04/2017  [name] February 10, 1950 [id number]   EMMI: general discharge red alert Referral date: 11/04/17 Referral reason: unfilled prescriptions, questions about discharge papers Insurance: health team advantage Day # 1 Attempt #1  Telephone call to patient regarding referral. Unable to reach patient. HIPAA compliant voice message left with call back phone number.   PLAN: RNCM will attempt 2nd telephone call to patient within 4 business days. RNCM will send outreach letter.   Quinn Plowman RN,BSN, [place] Telephonic  [phone]

## 2017-11-06 ENCOUNTER — Other Ambulatory Visit: Payer: Self-pay

## 2017-11-06 NOTE — Patient Outreach (Signed)
[facility]) Care Management  11/06/2017  [name] 11/16/1950 [id number]  EMMI: general discharge red alert Referral date: 11/04/17 Referral reason: unfilled prescriptions, questions about discharge papers Insurance: health team advantage Day # 1 Attempt #2  Telephone call to patient regarding referral. Unable to reach patient or leave voice message.  Phone only rang.   PLAN: RNCM will attempt 3rd telephone call to patient within 4 business days.   Quinn Plowman RN,BSN, [place] Telephonic  [phone]

## 2017-11-08 ENCOUNTER — Other Ambulatory Visit: Payer: Self-pay

## 2017-11-08 DIAGNOSIS — F3341 Major depressive disorder, recurrent, in partial remission: Secondary | ICD-10-CM

## 2017-11-08 MED ORDER — CITALOPRAM HYDROBROMIDE 40 MG PO TABS: 40.0000 mg | ORAL_TABLET | Freq: Every day | ORAL | 1 refills | Status: DC

## 2017-11-11 ENCOUNTER — Other Ambulatory Visit: Payer: Self-pay

## 2017-11-11 NOTE — Patient Outreach (Signed)
[facility]) Care Management  11/11/2017  Jonel Sick 02-Jun-1950 [id number]   EMMI:general discharge red alert Referral date:11/04/17 Referral reason:unfilled prescriptions, questions about discharge papers Insurance: health team advantage Day #1 Attempt #3  Telephone call to patient regarding referral. Unable to reach patient or leave voice message.  Phone only rang.  Telephone call to listed mobile number.  Unable to reach patient. Message states patient is not accepting calls at this time.  Unable to leave message on mobile number.   PLAN:If no return call will proceed with closure   Quinn Plowman RN,BSN, [place] Telephonic  [phone]

## 2017-11-18 ENCOUNTER — Other Ambulatory Visit: Payer: Self-pay

## 2017-11-18 DIAGNOSIS — F410 Panic disorder [episodic paroxysmal anxiety] without agoraphobia: Secondary | ICD-10-CM

## 2017-11-19 ENCOUNTER — Telehealth: Payer: Self-pay | Admitting: Nurse Practitioner

## 2017-11-19 ENCOUNTER — Other Ambulatory Visit: Payer: Self-pay | Admitting: Nurse Practitioner

## 2017-11-19 ENCOUNTER — Ambulatory Visit: Payer: Self-pay | Admitting: Nurse Practitioner

## 2017-11-19 DIAGNOSIS — J4489 Other specified chronic obstructive pulmonary disease: Secondary | ICD-10-CM

## 2017-11-19 DIAGNOSIS — J449 Chronic obstructive pulmonary disease, unspecified: Secondary | ICD-10-CM

## 2017-11-19 DIAGNOSIS — Z01818 Encounter for other preprocedural examination: Secondary | ICD-10-CM | POA: Diagnosis not present

## 2017-11-19 DIAGNOSIS — J441 Chronic obstructive pulmonary disease with (acute) exacerbation: Secondary | ICD-10-CM

## 2017-11-19 DIAGNOSIS — F410 Panic disorder [episodic paroxysmal anxiety] without agoraphobia: Secondary | ICD-10-CM

## 2017-11-19 MED ORDER — GABAPENTIN 300 MG PO CAPS: ORAL_CAPSULE | ORAL | 1 refills | Status: DC

## 2017-11-19 NOTE — Addendum Note (Signed)
Addended by: Cleaster Corin on: 11/19/2017 09:22 AM   Modules accepted: Orders

## 2017-11-19 NOTE — Telephone Encounter (Signed)
Elizabeth at [facility] said pt told her she was seen last week and was cleared for surgery and paperwork would be faxed to them by Friday last week.  Pt has appt with anesthesiologist in an hour.  Please call Benjamine Mola [phone] ext [phone]

## 2017-11-19 NOTE — Telephone Encounter (Signed)
Received form about 3 business days ago.  Reviewed and patient will need OV before being cleared to make sure COPD/PNA exacerbation has full resolved.  Repeat CXR today.

## 2017-11-19 NOTE — Telephone Encounter (Signed)
Incoming call

## 2017-11-20 ENCOUNTER — Ambulatory Visit
Admission: RE | Admit: 2017-11-20 | Discharge: 2017-11-20 | Disposition: A | Discharge status: Home / Self Care | Payer: PPO | Source: Ambulatory Visit | Attending: Nurse Practitioner | Admitting: Nurse Practitioner

## 2017-11-20 ENCOUNTER — Ambulatory Visit (INDEPENDENT_AMBULATORY_CARE_PROVIDER_SITE_OTHER): Payer: PPO

## 2017-11-20 DIAGNOSIS — J189 Pneumonia, unspecified organism: Secondary | ICD-10-CM | POA: Diagnosis not present

## 2017-11-20 DIAGNOSIS — J4489 Other specified chronic obstructive pulmonary disease: Secondary | ICD-10-CM

## 2017-11-20 DIAGNOSIS — J441 Chronic obstructive pulmonary disease with (acute) exacerbation: Secondary | ICD-10-CM

## 2017-11-20 DIAGNOSIS — Z23 Encounter for immunization: Secondary | ICD-10-CM | POA: Diagnosis not present

## 2017-11-20 DIAGNOSIS — J449 Chronic obstructive pulmonary disease, unspecified: Secondary | ICD-10-CM

## 2017-11-28 ENCOUNTER — Other Ambulatory Visit: Payer: Self-pay

## 2017-11-28 DIAGNOSIS — I499 Cardiac arrhythmia, unspecified: Secondary | ICD-10-CM | POA: Diagnosis not present

## 2017-11-28 DIAGNOSIS — M961 Postlaminectomy syndrome, not elsewhere classified: Secondary | ICD-10-CM | POA: Diagnosis not present

## 2017-11-28 DIAGNOSIS — E039 Hypothyroidism, unspecified: Secondary | ICD-10-CM | POA: Diagnosis not present

## 2017-11-28 DIAGNOSIS — F17210 Nicotine dependence, cigarettes, uncomplicated: Secondary | ICD-10-CM | POA: Diagnosis not present

## 2017-11-28 DIAGNOSIS — J42 Unspecified chronic bronchitis: Secondary | ICD-10-CM

## 2017-11-28 DIAGNOSIS — J449 Chronic obstructive pulmonary disease, unspecified: Secondary | ICD-10-CM | POA: Diagnosis not present

## 2017-11-28 DIAGNOSIS — M48061 Spinal stenosis, lumbar region without neurogenic claudication: Secondary | ICD-10-CM | POA: Diagnosis not present

## 2017-11-28 DIAGNOSIS — F419 Anxiety disorder, unspecified: Secondary | ICD-10-CM | POA: Diagnosis not present

## 2017-11-28 DIAGNOSIS — I10 Essential (primary) hypertension: Secondary | ICD-10-CM | POA: Diagnosis not present

## 2017-11-28 DIAGNOSIS — F329 Major depressive disorder, single episode, unspecified: Secondary | ICD-10-CM | POA: Diagnosis not present

## 2017-11-28 DIAGNOSIS — Z8701 Personal history of pneumonia (recurrent): Secondary | ICD-10-CM | POA: Diagnosis not present

## 2017-11-28 DIAGNOSIS — L409 Psoriasis, unspecified: Secondary | ICD-10-CM | POA: Diagnosis not present

## 2017-11-28 MED ORDER — ALBUTEROL SULFATE HFA 108 (90 BASE) MCG/ACT IN AERS: 2.0000 | INHALATION_SPRAY | Freq: Four times a day (QID) | RESPIRATORY_TRACT | 1 refills | Status: DC | PRN

## 2017-11-28 NOTE — Patient Outreach (Signed)
[facility]) Care Management  11/28/2017  [name] 1950-06-21 [id number]  EMMI:general discharge red alert Referral date:11/04/17 Referral reason:unfilled prescriptions, questions about discharge papers Insurance: health team advantage Day #1  No response after 3 telephone calls and outreach letter attempt.  PLAN: RNCM will close patient due to being unable to reach.  RNCM will send closure notification to patient's primary MD   Quinn Plowman RN,BSN,CCM [facility] Telephonic  [phone]

## 2017-11-30 ENCOUNTER — Inpatient Hospital Stay
Admission: EM | Admit: 2017-11-30 | Discharge: 2017-12-03 | DRG: 917 | Disposition: A | Discharge status: Home Health | Payer: PPO | Attending: Internal Medicine | Admitting: Internal Medicine

## 2017-11-30 ENCOUNTER — Emergency Department: Payer: PPO

## 2017-11-30 DIAGNOSIS — R748 Abnormal levels of other serum enzymes: Secondary | ICD-10-CM | POA: Diagnosis not present

## 2017-11-30 DIAGNOSIS — I248 Other forms of acute ischemic heart disease: Secondary | ICD-10-CM | POA: Diagnosis present

## 2017-11-30 DIAGNOSIS — Z79890 Hormone replacement therapy (postmenopausal): Secondary | ICD-10-CM

## 2017-11-30 DIAGNOSIS — T402X1A Poisoning by other opioids, accidental (unintentional), initial encounter: Principal | ICD-10-CM | POA: Diagnosis present

## 2017-11-30 DIAGNOSIS — E039 Hypothyroidism, unspecified: Secondary | ICD-10-CM | POA: Diagnosis present

## 2017-11-30 DIAGNOSIS — Z882 Allergy status to sulfonamides status: Secondary | ICD-10-CM

## 2017-11-30 DIAGNOSIS — J9600 Acute respiratory failure, unspecified whether with hypoxia or hypercapnia: Secondary | ICD-10-CM | POA: Diagnosis present

## 2017-11-30 DIAGNOSIS — E876 Hypokalemia: Secondary | ICD-10-CM | POA: Diagnosis present

## 2017-11-30 DIAGNOSIS — J441 Chronic obstructive pulmonary disease with (acute) exacerbation: Secondary | ICD-10-CM | POA: Diagnosis not present

## 2017-11-30 DIAGNOSIS — G8929 Other chronic pain: Secondary | ICD-10-CM | POA: Diagnosis present

## 2017-11-30 DIAGNOSIS — N179 Acute kidney failure, unspecified: Secondary | ICD-10-CM | POA: Diagnosis present

## 2017-11-30 DIAGNOSIS — Z90721 Acquired absence of ovaries, unilateral: Secondary | ICD-10-CM

## 2017-11-30 DIAGNOSIS — Z803 Family history of malignant neoplasm of breast: Secondary | ICD-10-CM

## 2017-11-30 DIAGNOSIS — Z825 Family history of asthma and other chronic lower respiratory diseases: Secondary | ICD-10-CM

## 2017-11-30 DIAGNOSIS — Z794 Long term (current) use of insulin: Secondary | ICD-10-CM

## 2017-11-30 DIAGNOSIS — I959 Hypotension, unspecified: Secondary | ICD-10-CM | POA: Diagnosis present

## 2017-11-30 DIAGNOSIS — M47817 Spondylosis without myelopathy or radiculopathy, lumbosacral region: Secondary | ICD-10-CM | POA: Diagnosis not present

## 2017-11-30 DIAGNOSIS — Z90710 Acquired absence of both cervix and uterus: Secondary | ICD-10-CM

## 2017-11-30 DIAGNOSIS — Z841 Family history of disorders of kidney and ureter: Secondary | ICD-10-CM

## 2017-11-30 DIAGNOSIS — J189 Pneumonia, unspecified organism: Secondary | ICD-10-CM

## 2017-11-30 DIAGNOSIS — I471 Supraventricular tachycardia: Secondary | ICD-10-CM | POA: Diagnosis not present

## 2017-11-30 DIAGNOSIS — R918 Other nonspecific abnormal finding of lung field: Secondary | ICD-10-CM | POA: Diagnosis not present

## 2017-11-30 DIAGNOSIS — J440 Chronic obstructive pulmonary disease with acute lower respiratory infection: Secondary | ICD-10-CM | POA: Diagnosis present

## 2017-11-30 DIAGNOSIS — J9601 Acute respiratory failure with hypoxia: Secondary | ICD-10-CM | POA: Diagnosis present

## 2017-11-30 DIAGNOSIS — T426X5A Adverse effect of other antiepileptic and sedative-hypnotic drugs, initial encounter: Secondary | ICD-10-CM | POA: Diagnosis present

## 2017-11-30 DIAGNOSIS — I700 Atherosclerosis of aorta: Secondary | ICD-10-CM | POA: Diagnosis present

## 2017-11-30 DIAGNOSIS — Z885 Allergy status to narcotic agent status: Secondary | ICD-10-CM

## 2017-11-30 DIAGNOSIS — I4891 Unspecified atrial fibrillation: Secondary | ICD-10-CM | POA: Diagnosis not present

## 2017-11-30 DIAGNOSIS — M1990 Unspecified osteoarthritis, unspecified site: Secondary | ICD-10-CM | POA: Diagnosis present

## 2017-11-30 DIAGNOSIS — Z9842 Cataract extraction status, left eye: Secondary | ICD-10-CM

## 2017-11-30 DIAGNOSIS — I10 Essential (primary) hypertension: Secondary | ICD-10-CM | POA: Diagnosis present

## 2017-11-30 DIAGNOSIS — X58XXXA Exposure to other specified factors, initial encounter: Secondary | ICD-10-CM | POA: Diagnosis present

## 2017-11-30 DIAGNOSIS — F329 Major depressive disorder, single episode, unspecified: Secondary | ICD-10-CM | POA: Diagnosis present

## 2017-11-30 DIAGNOSIS — Z716 Tobacco abuse counseling: Secondary | ICD-10-CM | POA: Diagnosis not present

## 2017-11-30 DIAGNOSIS — R7989 Other specified abnormal findings of blood chemistry: Secondary | ICD-10-CM | POA: Diagnosis not present

## 2017-11-30 DIAGNOSIS — Z9841 Cataract extraction status, right eye: Secondary | ICD-10-CM

## 2017-11-30 DIAGNOSIS — F17210 Nicotine dependence, cigarettes, uncomplicated: Secondary | ICD-10-CM | POA: Diagnosis not present

## 2017-11-30 DIAGNOSIS — I6523 Occlusion and stenosis of bilateral carotid arteries: Secondary | ICD-10-CM | POA: Diagnosis present

## 2017-11-30 DIAGNOSIS — G92 Toxic encephalopathy: Secondary | ICD-10-CM | POA: Diagnosis not present

## 2017-11-30 DIAGNOSIS — Z961 Presence of intraocular lens: Secondary | ICD-10-CM | POA: Diagnosis present

## 2017-11-30 DIAGNOSIS — E782 Mixed hyperlipidemia: Secondary | ICD-10-CM | POA: Diagnosis present

## 2017-11-30 DIAGNOSIS — Z881 Allergy status to other antibiotic agents status: Secondary | ICD-10-CM | POA: Diagnosis not present

## 2017-11-30 DIAGNOSIS — F419 Anxiety disorder, unspecified: Secondary | ICD-10-CM | POA: Diagnosis present

## 2017-11-30 DIAGNOSIS — J9811 Atelectasis: Secondary | ICD-10-CM | POA: Diagnosis not present

## 2017-11-30 DIAGNOSIS — R404 Transient alteration of awareness: Secondary | ICD-10-CM | POA: Diagnosis not present

## 2017-11-30 DIAGNOSIS — J9602 Acute respiratory failure with hypercapnia: Secondary | ICD-10-CM | POA: Diagnosis present

## 2017-11-30 DIAGNOSIS — E872 Acidosis: Secondary | ICD-10-CM | POA: Diagnosis not present

## 2017-11-30 DIAGNOSIS — K219 Gastro-esophageal reflux disease without esophagitis: Secondary | ICD-10-CM | POA: Diagnosis present

## 2017-11-30 DIAGNOSIS — R531 Weakness: Secondary | ICD-10-CM | POA: Diagnosis not present

## 2017-11-30 DIAGNOSIS — M545 Low back pain: Secondary | ICD-10-CM | POA: Diagnosis present

## 2017-11-30 DIAGNOSIS — G4733 Obstructive sleep apnea (adult) (pediatric): Secondary | ICD-10-CM | POA: Diagnosis present

## 2017-11-30 DIAGNOSIS — Z8249 Family history of ischemic heart disease and other diseases of the circulatory system: Secondary | ICD-10-CM

## 2017-11-30 DIAGNOSIS — M25552 Pain in left hip: Secondary | ICD-10-CM | POA: Diagnosis present

## 2017-11-30 DIAGNOSIS — Z7982 Long term (current) use of aspirin: Secondary | ICD-10-CM

## 2017-11-30 DIAGNOSIS — G9340 Encephalopathy, unspecified: Secondary | ICD-10-CM | POA: Diagnosis not present

## 2017-11-30 DIAGNOSIS — E860 Dehydration: Secondary | ICD-10-CM | POA: Diagnosis present

## 2017-11-30 DIAGNOSIS — Z79899 Other long term (current) drug therapy: Secondary | ICD-10-CM

## 2017-11-30 DIAGNOSIS — Z7951 Long term (current) use of inhaled steroids: Secondary | ICD-10-CM

## 2017-11-30 DIAGNOSIS — Z9889 Other specified postprocedural states: Secondary | ICD-10-CM | POA: Diagnosis not present

## 2017-11-30 DIAGNOSIS — Z7952 Long term (current) use of systemic steroids: Secondary | ICD-10-CM

## 2017-11-30 DIAGNOSIS — Z7902 Long term (current) use of antithrombotics/antiplatelets: Secondary | ICD-10-CM

## 2017-11-30 DIAGNOSIS — I499 Cardiac arrhythmia, unspecified: Secondary | ICD-10-CM | POA: Diagnosis not present

## 2017-11-30 DIAGNOSIS — Z823 Family history of stroke: Secondary | ICD-10-CM

## 2017-11-30 DIAGNOSIS — Z8701 Personal history of pneumonia (recurrent): Secondary | ICD-10-CM

## 2017-11-30 DIAGNOSIS — R000 Tachycardia, unspecified: Secondary | ICD-10-CM | POA: Diagnosis not present

## 2017-11-30 DIAGNOSIS — R7303 Prediabetes: Secondary | ICD-10-CM | POA: Diagnosis present

## 2017-11-30 DIAGNOSIS — E875 Hyperkalemia: Secondary | ICD-10-CM | POA: Diagnosis present

## 2017-11-30 DIAGNOSIS — R4182 Altered mental status, unspecified: Secondary | ICD-10-CM | POA: Diagnosis not present

## 2017-11-30 DIAGNOSIS — T424X5A Adverse effect of benzodiazepines, initial encounter: Secondary | ICD-10-CM | POA: Diagnosis present

## 2017-11-30 LAB — URINE DRUG SCREEN, QUALITATIVE (ARMC ONLY)
Amphetamines, Ur Screen: NOT DETECTED
BARBITURATES, UR SCREEN: NOT DETECTED
Benzodiazepine, Ur Scrn: POSITIVE — AB
Cannabinoid 50 Ng, Ur ~~LOC~~: NOT DETECTED
Cocaine Metabolite,Ur ~~LOC~~: NOT DETECTED
MDMA (Ecstasy)Ur Screen: NOT DETECTED
METHADONE SCREEN, URINE: NOT DETECTED
Opiate, Ur Screen: POSITIVE — AB
Phencyclidine (PCP) Ur S: NOT DETECTED
TRICYCLIC, UR SCREEN: NOT DETECTED

## 2017-11-30 LAB — CBC WITH DIFFERENTIAL/PLATELET
ABS IMMATURE GRANULOCYTES: 0.12 10*3/uL — AB (ref 0.00–0.07)
BASOS PCT: 0 %
Basophils Absolute: 0.1 10*3/uL (ref 0.0–0.1)
Eosinophils Absolute: 0 10*3/uL (ref 0.0–0.5)
Eosinophils Relative: 0 %
HCT: 42.1 % (ref 36.0–46.0)
Hemoglobin: 12.3 g/dL (ref 12.0–15.0)
Immature Granulocytes: 1 %
Lymphocytes Relative: 9 %
Lymphs Abs: 1.3 10*3/uL (ref 0.7–4.0)
MCH: 29.6 pg (ref 26.0–34.0)
MCHC: 29.2 g/dL — ABNORMAL LOW (ref 30.0–36.0)
MCV: 101.2 fL — AB (ref 80.0–100.0)
MONOS PCT: 10 %
Monocytes Absolute: 1.4 10*3/uL — ABNORMAL HIGH (ref 0.1–1.0)
NEUTROS ABS: 11.4 10*3/uL — AB (ref 1.7–7.7)
NEUTROS PCT: 80 %
PLATELETS: 221 10*3/uL (ref 150–400)
RBC: 4.16 MIL/uL (ref 3.87–5.11)
RDW: 14.1 % (ref 11.5–15.5)
WBC: 14.2 10*3/uL — ABNORMAL HIGH (ref 4.0–10.5)
nRBC: 0 % (ref 0.0–0.2)

## 2017-11-30 LAB — URINALYSIS, COMPLETE (UACMP) WITH MICROSCOPIC
BILIRUBIN URINE: NEGATIVE
Glucose, UA: NEGATIVE mg/dL
Hgb urine dipstick: NEGATIVE
Ketones, ur: NEGATIVE mg/dL
Leukocytes, UA: NEGATIVE
Nitrite: NEGATIVE
Protein, ur: 30 mg/dL — AB
Specific Gravity, Urine: 1.016 (ref 1.005–1.030)
pH: 5 (ref 5.0–8.0)

## 2017-11-30 LAB — BLOOD GAS, VENOUS
Acid-Base Excess: 2.4 mmol/L — ABNORMAL HIGH (ref 0.0–2.0)
Bicarbonate: 33.9 mmol/L — ABNORMAL HIGH (ref 20.0–28.0)
FIO2: 0.44
Patient temperature: 37
pCO2, Ven: 93 mmHg (ref 44.0–60.0)
pH, Ven: 7.17 — CL (ref 7.250–7.430)
pO2, Ven: <31 mmHg — CL (ref 32.0–45.0)

## 2017-11-30 LAB — COMPREHENSIVE METABOLIC PANEL
ALBUMIN: 3.5 g/dL (ref 3.5–5.0)
ALK PHOS: 64 U/L (ref 38–126)
ALT: 16 U/L (ref 0–44)
AST: 36 U/L (ref 15–41)
Anion gap: 8 (ref 5–15)
BUN: 31 mg/dL — AB (ref 8–23)
CHLORIDE: 97 mmol/L — AB (ref 98–111)
CO2: 29 mmol/L (ref 22–32)
CREATININE: 1.62 mg/dL — AB (ref 0.44–1.00)
Calcium: 8.7 mg/dL — ABNORMAL LOW (ref 8.9–10.3)
GFR calc Af Amer: 37 mL/min — ABNORMAL LOW (ref >60)
GFR, EST NON AFRICAN AMERICAN: 32 mL/min — AB (ref >60)
Glucose, Bld: 190 mg/dL — ABNORMAL HIGH (ref 70–99)
Potassium: 5.3 mmol/L — ABNORMAL HIGH (ref 3.5–5.1)
Sodium: 134 mmol/L — ABNORMAL LOW (ref 135–145)
Total Bilirubin: 1.3 mg/dL — ABNORMAL HIGH (ref 0.3–1.2)
Total Protein: 6.3 g/dL — ABNORMAL LOW (ref 6.5–8.1)

## 2017-11-30 LAB — TROPONIN I: TROPONIN I: 0.28 ng/mL — AB (ref <0.03)

## 2017-11-30 LAB — GLUCOSE, CAPILLARY: Glucose-Capillary: 142 mg/dL — ABNORMAL HIGH (ref 70–99)

## 2017-11-30 LAB — ETHANOL

## 2017-11-30 MED ORDER — SODIUM CHLORIDE 0.9 % IV SOLN
Freq: Once | INTRAVENOUS | Status: AC
  Administered 2017-11-30: 20:00:00 via INTRAVENOUS

## 2017-11-30 MED ORDER — FLUTICASONE FUROATE-VILANTEROL 200-25 MCG/INH IN AEPB
1.0000 | INHALATION_SPRAY | Freq: Every day | RESPIRATORY_TRACT | Status: DC
  Filled 2017-11-30: qty 28

## 2017-11-30 MED ORDER — CITALOPRAM HYDROBROMIDE 20 MG PO TABS
40.0000 mg | ORAL_TABLET | Freq: Every day | ORAL | Status: DC
  Administered 2017-12-01 – 2017-12-03 (×3): 40 mg via ORAL
  Filled 2017-11-30 (×3): qty 2

## 2017-11-30 MED ORDER — NALOXONE HCL 2 MG/2ML IJ SOSY
0.4000 mg | PREFILLED_SYRINGE | Freq: Once | INTRAMUSCULAR | Status: AC
  Administered 2017-11-30: 0.4 mg via INTRAVENOUS

## 2017-11-30 MED ORDER — LEVOTHYROXINE SODIUM 25 MCG PO TABS
125.0000 ug | ORAL_TABLET | Freq: Every day | ORAL | Status: DC
  Administered 2017-12-01 – 2017-12-02 (×2): 125 ug via ORAL
  Filled 2017-11-30 (×2): qty 1

## 2017-11-30 MED ORDER — NALOXONE HCL 0.4 MG/ML IJ SOLN: 0.2000 mg | Freq: Once | INTRAMUSCULAR | Status: DC

## 2017-11-30 MED ORDER — ONDANSETRON HCL 4 MG/2ML IJ SOLN
4.0000 mg | Freq: Four times a day (QID) | INTRAMUSCULAR | Status: DC | PRN
  Administered 2017-12-02: 4 mg via INTRAVENOUS
  Filled 2017-11-30: qty 2

## 2017-11-30 MED ORDER — HEPARIN SODIUM (PORCINE) 5000 UNIT/ML IJ SOLN
5000.0000 [IU] | Freq: Three times a day (TID) | INTRAMUSCULAR | Status: DC
  Administered 2017-12-01 – 2017-12-03 (×7): 5000 [IU] via SUBCUTANEOUS
  Filled 2017-11-30 (×7): qty 1

## 2017-11-30 MED ORDER — DILTIAZEM HCL ER COATED BEADS 120 MG PO CP24: 120.0000 mg | ORAL_CAPSULE | Freq: Every day | ORAL | Status: DC

## 2017-11-30 MED ORDER — SODIUM CHLORIDE 0.9 % IV SOLN
INTRAVENOUS | Status: DC
  Administered 2017-11-30: via INTRAVENOUS

## 2017-11-30 MED ORDER — BISACODYL 5 MG PO TBEC: 5.0000 mg | DELAYED_RELEASE_TABLET | Freq: Every day | ORAL | Status: DC | PRN

## 2017-11-30 MED ORDER — ONDANSETRON HCL 4 MG PO TABS: 4.0000 mg | ORAL_TABLET | Freq: Four times a day (QID) | ORAL | Status: DC | PRN

## 2017-11-30 MED ORDER — DOCUSATE SODIUM 100 MG PO CAPS
100.0000 mg | ORAL_CAPSULE | Freq: Two times a day (BID) | ORAL | Status: DC
  Administered 2017-12-01 – 2017-12-03 (×5): 100 mg via ORAL
  Filled 2017-11-30 (×4): qty 1

## 2017-11-30 MED ORDER — BUSPIRONE HCL 15 MG PO TABS
7.5000 mg | ORAL_TABLET | Freq: Three times a day (TID) | ORAL | Status: DC
  Administered 2017-12-01 – 2017-12-03 (×8): 7.5 mg via ORAL
  Filled 2017-11-30 (×9): qty 1

## 2017-11-30 MED ORDER — ALBUTEROL SULFATE (2.5 MG/3ML) 0.083% IN NEBU: 2.5000 mg | INHALATION_SOLUTION | Freq: Four times a day (QID) | RESPIRATORY_TRACT | Status: DC | PRN

## 2017-11-30 MED ORDER — TRAZODONE HCL 50 MG PO TABS: 25.0000 mg | ORAL_TABLET | Freq: Every evening | ORAL | Status: DC | PRN

## 2017-11-30 MED ORDER — LISINOPRIL 10 MG PO TABS: 10.0000 mg | ORAL_TABLET | Freq: Every day | ORAL | Status: DC

## 2017-11-30 MED ORDER — NALOXONE HCL 0.4 MG/ML IJ SOLN
0.2000 mg | Freq: Once | INTRAMUSCULAR | Status: DC
  Filled 2017-11-30: qty 1

## 2017-11-30 MED ORDER — NALOXONE HCL 2 MG/2ML IJ SOSY
PREFILLED_SYRINGE | INTRAMUSCULAR | Status: AC
  Filled 2017-11-30: qty 2

## 2017-11-30 NOTE — ED Notes (Addendum)
Dr. Jimmye Norman aware of Troponin of 0.28.

## 2017-11-30 NOTE — ED Notes (Signed)
Bi-pap adjusted. Family at bedside. Patient's home meds given to the patient's son.

## 2017-11-30 NOTE — ED Notes (Signed)
Unable to give report due to RN not being available at this time.

## 2017-11-30 NOTE — ED Notes (Signed)
Waiting for RT to transport.

## 2017-11-30 NOTE — ED Triage Notes (Signed)
Per EMS report, patient had back surgery two days ago and was sent home with Oxycodone 5mg . Patient's friend found her altered today and called EMS. Patient was unable to state how many Oxycodone she took today.

## 2017-11-30 NOTE — H&P (Addendum)
[facility] NAME: [name]    MR#:  [id number]  DATE OF BIRTH:  09-06-1950  DATE OF ADMISSION:  11/30/2017  PRIMARY CARE PHYSICIAN: Mikey College, NP   REQUESTING/REFERRING PHYSICIAN:   CHIEF COMPLAINT:   Chief Complaint  Patient presents with  . Drug Overdose    HISTORY OF PRESENT ILLNESS: [name]  is a 67 y.o. female with a known history of tobacco abuse, COPD, hypertension, thyroid disease, hypothyroidism and other comorbidities.  Per family, patient had back surgery/kyphoplasty 48 hours ago. Patient is currently drowsy and confused, unable to provide history.  Information was taken from reviewing the medical chart and from discussion with emergency room physician. She was brought to emergency room for altered mental status.  Per family, patient to come oxycodone earlier today. Patient was given Narcan and she was placed on BiPAP in the emergency room with some improvement in her mental status. No reports of fever, chills, vomiting, diarrhea, bleeding. Blood test done emergency room showed sodium 134, BUN 31, creatinine 1.62, troponin at 0.28, WBC 14.2. Venous blood gas showed pH is 7.17, PCO2 at 93 and PO2 lower than 31;  Bicarbonate 33.9.  UA is positive for rare bacteria, urine culture result is pending. EKG shows normal sinus rhythm, with heart rate at 82 bpm, normal intervals, no acute ischemic changes. No acute cardiopulmonary abnormalities per chest x-ray.    PAST MEDICAL HISTORY:   Past Medical History:  Diagnosis Date  . Anxiety   . Arthritis   . Chronic back pain   . COPD (chronic obstructive pulmonary disease) ([place])   . Depression   . GERD (gastroesophageal reflux disease)   . Hypertension   . Hypothyroidism   . Pneumonia    RECENT X 3  . PONV (postoperative nausea and vomiting)   . Psoriasis   . Thyroid disease     PAST SURGICAL HISTORY:  Past Surgical History:  Procedure Laterality  Date  . ABDOMINAL HYSTERECTOMY     heavy bleeding  . bladder stimulator    . CARPAL TUNNEL RELEASE    . CATARACT EXTRACTION W/PHACO Right 03/07/2015   Procedure: CATARACT EXTRACTION PHACO AND INTRAOCULAR LENS PLACEMENT (IOC);  Surgeon: Estill Cotta, MD;  Location: [facility] ORS;  Service: Ophthalmology;  Laterality: Right;  [name] 01:14 AP% 26.4 CDE 33.04 fluid pack lot # [id number]  . CATARACT EXTRACTION W/PHACO Left 03/21/2015   Procedure: CATARACT EXTRACTION PHACO AND INTRAOCULAR LENS PLACEMENT (IOC);  Surgeon: Estill Cotta, MD;  Location: [facility] ORS;  Service: Ophthalmology;  Laterality: Left;  [name] 01:21 AP% 25.6 CDE 39.78 fluid pack lot # [id number]  . HAND SURGERY Right   . INTERSTIM IMPLANT REMOVAL N/A 09/07/2015   Procedure: REMOVAL OF INTERSTIM IMPLANT;  Surgeon: Bjorn Loser, MD;  Location: [facility] ORS;  Service: Urology;  Laterality: N/A;  . OOPHORECTOMY Right    ovarian cyst    SOCIAL HISTORY:  Social History   Tobacco Use  . Smoking status: Current Every Day Smoker    Packs/day: 1.00    Years: 35.00    Pack years: 35.00    Types: Cigarettes  . Smokeless tobacco: Never Used  . Tobacco comment: Previously smoked 2 ppd  Substance Use Topics  . Alcohol use: No    FAMILY HISTORY:  Family History  Problem Relation Age of Onset  . Heart disease Mother   . Breast cancer Mother   . Heart attack Mother   . COPD Father   .  Stroke Paternal Uncle   . Kidney disease Sister   . Breast cancer Cousin   . Breast cancer Cousin   . Heart attack Maternal Aunt   . Stroke Paternal Grandfather   . Kidney cancer Neg Hx   . Bladder Cancer Neg Hx     DRUG ALLERGIES:  Allergies  Allergen Reactions  . Ciprofloxacin Hives and Diarrhea  . Oxycodone Diarrhea  . Oxycodone-Acetaminophen Diarrhea  . Oxycodone-Acetaminophen Diarrhea  . Sulfa Antibiotics Rash    Throat, mouth Throat, mouth    REVIEW OF SYSTEMS:   Unable to obtain due to patient's altered mental  status.  MEDICATIONS AT HOME:  Prior to Admission medications   Medication Sig Start Date End Date Taking? Authorizing Provider  acetaminophen (TYLENOL) 500 MG tablet Take 500 mg by mouth every 6 (six) hours as needed for moderate pain.     [provider]  albuterol (PROVENTIL HFA;VENTOLIN HFA) 108 (90 Base) MCG/ACT inhaler Inhale 2 puffs into the lungs every 6 (six) hours as needed for wheezing or shortness of breath. 11/28/17   Mikey College, NP  budesonide-formoterol [facility]) 160-4.5 MCG/ACT inhaler Inhale 2 puffs into the lungs 2 (two) times daily. 09/27/17   Mikey College, NP  busPIRone (BUSPAR) 7.5 MG tablet Take 1 tablet (7.5 mg total) by mouth 3 (three) times daily. 05/14/17   Mikey College, NP  citalopram (CELEXA) 40 MG tablet Take 1 tablet (40 mg total) by mouth daily. 11/08/17   Mikey College, NP  clonazePAM (KLONOPIN) 1 MG tablet Take 1 tablet (1MG ) by mouth every morning, 1/2 tablet (0.5MG ) every afternoon and 1 tablet (1MG ) by mouth every night 10/08/17   Mikey College, NP  clotrimazole-betamethasone (LOTRISONE) cream Apply 1 application topically 2 (two) times daily. 11/08/16   Gae Dry, MD  diltiazem (CARDIZEM CD) 120 MG 24 hr capsule Take 1 capsule (120 mg total) by mouth daily. 07/24/17   Mikey College, NP  gabapentin (NEURONTIN) 300 MG capsule Take 1 capsule (300MG ) by mouth every morning and 2 capsules (600MG ) by mouth every night 11/19/17   Mikey College, NP  levothyroxine (SYNTHROID, LEVOTHROID) 125 MCG tablet Take 1 tablet (125 mcg total) by mouth daily with breakfast. 10/09/17   Mikey College, NP  lisinopril (PRINIVIL,ZESTRIL) 10 MG tablet Take 1 tablet (10 mg total) by mouth daily. 09/24/17   Mikey College, NP  Misc. Devices (QUAD CANE/SMALL BASE) MISC 1 Device by Does not apply route daily. 06/26/17   Mikey College, NP  Secukinumab (COSENTYX) 150 MG/ML SOSY Inject 150 mg into the skin  every 14 (fourteen) days. 2 injections per month    [provider]  triamcinolone (KENALOG) 0.025 % ointment Apply 1 application topically as directed.  02/08/16   [provider]      PHYSICAL EXAMINATION:   VITAL SIGNS: Blood pressure (!) 112/100, pulse 97, temperature 98.1 F (36.7 C), temperature source Oral, resp. rate (!) 22, SpO2 95 %.  GENERAL:  67 y.o.-year-old patient lying in the bed with no acute distress, on BiPAP.  Patient looks drowsy and confused. EYES: Pupils equal, round, reactive to light and accommodation. No scleral icterus.  HEENT: Head atraumatic, normocephalic. Oropharynx and nasopharynx clear.  NECK:  Supple, no jugular venous distention. No thyroid enlargement, no tenderness.  LUNGS: Normal breath sounds bilaterally, no wheezing, rales,rhonchi or crepitation. No use of accessory muscles of respiration.  CARDIOVASCULAR: S1, S2 normal. No S3/S4.  ABDOMEN: Soft, nontender, nondistended. Bowel  sounds present. No organomegaly or mass.  EXTREMITIES: No pedal edema, cyanosis, or clubbing.  NEUROLOGIC EXAM: Is limited, due to patient's altered mental status.  She is moving all her extremities, no focal weakness appreciated PSYCHIATRIC: The patient is alert, but drowsy.  SKIN: No obvious rash, lesion, or ulcer.   LABORATORY PANEL:   CBC Recent Labs  Lab 11/30/17 1945  WBC 14.2*  HGB 12.3  HCT 42.1  PLT 221  MCV 101.2*  MCH 29.6  MCHC 29.2*  RDW 14.1  LYMPHSABS 1.3  MONOABS 1.4*  EOSABS 0.0  BASOSABS 0.1   ------------------------------------------------------------------------------------------------------------------  Chemistries  Recent Labs  Lab 11/30/17 1945  NA 134*  K 5.3*  CL 97*  CO2 29  GLUCOSE 190*  BUN 31*  CREATININE 1.62*  CALCIUM 8.7*  AST 36  ALT 16  ALKPHOS 64  BILITOT 1.3*   ------------------------------------------------------------------------------------------------------------------ CrCl cannot be  calculated (Unknown ideal weight.). ------------------------------------------------------------------------------------------------------------------ No results for input(s): TSH, T4TOTAL, T3FREE, THYROIDAB in the last 72 hours.  Invalid input(s): FREET3   Coagulation profile No results for input(s): INR, PROTIME in the last 168 hours. ------------------------------------------------------------------------------------------------------------------- No results for input(s): DDIMER in the last 72 hours. -------------------------------------------------------------------------------------------------------------------  Cardiac Enzymes Recent Labs  Lab 11/30/17 1945  TROPONINI 0.28*   ------------------------------------------------------------------------------------------------------------------ Invalid input(s): POCBNP  ---------------------------------------------------------------------------------------------------------------  Urinalysis    Component Value Date/Time   COLORURINE YELLOW (A) 06/22/2014 1634   APPEARANCEUR Clear 07/26/2016 1445   LABSPEC 1.020 06/22/2014 1634   LABSPEC 1.012 10/10/2011 1219   PHURINE 5.0 06/22/2014 1634   GLUCOSEU Negative 07/26/2016 1445   GLUCOSEU Negative 10/10/2011 1219   HGBUR NEGATIVE 06/22/2014 1634   BILIRUBINUR Negative 07/26/2016 1445   BILIRUBINUR Negative 10/10/2011 1219   KETONESUR NEGATIVE 06/22/2014 1634   PROTEINUR Negative 07/26/2016 1445   PROTEINUR 30 (A) 06/22/2014 1634   NITRITE Positive (A) 07/26/2016 1445   NITRITE NEGATIVE 06/22/2014 1634   LEUKOCYTESUR 3+ (A) 07/26/2016 1445   LEUKOCYTESUR 2+ 10/10/2011 1219     RADIOLOGY: Dg Chest 1 View  Result Date: 11/30/2017 CLINICAL DATA:  Per EMS report, patient had back surgery two days ago and was sent home with Oxycodone 5mg . Patient's friend found her altered today and called EMS. Patient was unable to state how many Oxycodone she took today.pt low BP and O2  stats in room EXAM: CHEST  1 VIEW COMPARISON:  11/20/2017 FINDINGS: Cardiac silhouette is normal in size. No mediastinal or hilar masses. No convincing adenopathy. Lung volumes relatively low. Prominent bronchovascular markings most evident bases. Mild additional opacity at the bases likely due to subsegmental atelectasis. Lungs otherwise clear. No convincing pleural effusion. No pneumothorax. Skeletal structures are demineralized but grossly intact. IMPRESSION: No acute cardiopulmonary disease. Electronically Signed   By: Lajean Manes M.D.   On: 11/30/2017 19:58    EKG: Orders placed or performed during the hospital encounter of 11/30/17  . ED EKG  . ED EKG    IMPRESSION AND PLAN:  1.  Acute respiratory failure with hypoxia and and hypercapnia.  This is likely multifactorial, related to respiratory depression due to side effects from pain medications and other sedatives, recent anesthesia meds, ARF, COPD and possibly obstructive sleep apnea.  We will continue treatment with BiPAP, oxygen and avoid irrigations with sedative effect.  Will admit patient to stepdown unit for close observation further management.  Patient would likely need sleep studies as outpatient, after discharge. 2.  Acute renal failure, likely prerenal.  We will start gentle IV hydration and monitor kidney  function closely.  Avoid nephrotoxic medications. 3.  Acute encephalopathy, related to acute respiratory failure and acute renal failure.  We will continue to monitor clinically closely while treating the underlying disease. 4.  Elevated troponin level, at 0.28.  This is likely due to demand ischemia, from acute respiratory failure.  We will continue to monitor patient on telemetry and follow troponin levels to rule out ACS.  Will check 2D echo to further evaluate cardiac function. 5.  Tobacco abuse.  Smoking cessation will be discussed with patient in detail. 6.  COPD with acute exacerbation.  We will treat with IV steroids,  nebulizers, oxygen and BiPAP. 7.  Hypertension, stable, will resume home medications. 8.  Status post kyphoplasty 2 days ago.  Will have PT/OT eval and treat the patient.  All the records are reviewed and case discussed with ED and ICU providers. Management plans discussed with the patient, family and they are in agreement.  CODE STATUS: Full Code Status History    Date Active Date Inactive Code Status Order ID Comments User Context   10/28/2017 1621 10/30/2017 1351 Full Code [id number]  Dustin Flock, MD Inpatient   12/17/2015 1415 12/20/2015 2046 Full Code [id number]  Epifanio Lesches, MD ED       TOTAL TIME TAKING CARE OF THIS PATIENT: 55 minutes.    Amelia Jo M.D on 11/30/2017 at 9:46 PM  Between 7am to 6pm - Pager - [phone]  After 6pm go to [email or web address] - password [email or web address] [facility] [place] [facility]  [phone]  CC: Primary care physician; Mikey College, NP

## 2017-11-30 NOTE — Progress Notes (Signed)
eLink Physician-Brief Progress Note Patient Name: [name] DOB: [birth year] MRN: [id number]   Date of Service  11/30/2017  HPI/Events of Note  [name] is a 67 y.o. female with a history of anxiety, chronic back pain, COPD, GERD, hypertension, pneumonia, thyroid disease who presents to the ED for possible overdose.  Limited information in Epic as H&P not complete yet. PCCM asked to assume care in ICU. VSS.  eICU Interventions  No new orders.      Intervention Category Evaluation Type: New Patient Evaluation  Lysle Dingwall 11/30/2017, 11:47 PM

## 2017-11-30 NOTE — ED Notes (Signed)
In and out cath performed by Hardie Lora tech with Varney Baas and Tana Conch RN present

## 2017-11-30 NOTE — ED Provider Notes (Signed)
[facility] Emergency Department Provider Note       Time seen: ----------------------------------------- 7:36 PM on 11/30/2017 -----------------------------------------   I have reviewed the triage vital signs and the nursing notes.  HISTORY   Chief Complaint Drug Overdose    HPI [name] is a 67 y.o. female with a history of anxiety, chronic back pain, COPD, GERD, hypertension, pneumonia, thyroid disease who presents to the ED for possible overdose.  Patient had EMS called because of altered mental status.  Reportedly she had back surgery 48 hours ago.  She had taken an unknown quantity of oxycodone.  She seemed to respond to verbal stimuli in route by EMS.  She denies any symptoms other than low back pain.  Past Medical History:  Diagnosis Date  . Anxiety   . Arthritis   . Chronic back pain   . COPD (chronic obstructive pulmonary disease) ([place])   . Depression   . GERD (gastroesophageal reflux disease)   . Hypertension   . Hypothyroidism   . Pneumonia    RECENT X 3  . PONV (postoperative nausea and vomiting)   . Psoriasis   . Thyroid disease     Patient Active Problem List   Diagnosis Date Noted  . Thoracic aortic atherosclerosis ([place]) 02/12/2017  . Cystocele, midline 11/08/2016  . Vaginal atrophy 11/08/2016  . Chronic left hip pain 05/07/2016  . Insomnia 01/10/2016  . COPD (chronic obstructive pulmonary disease) with chronic bronchitis ([place]) 12/17/2015  . Bilateral carotid artery stenosis 08/22/2015  . Atherosclerotic peripheral vascular disease with intermittent claudication ([place]) 08/04/2015  . Benign essential HTN 08/04/2015  . Mixed hyperlipidemia 08/04/2015  . Abnormal finding on EKG 07/25/2015  . Gravida 2 para 2 07/01/2015  . Panic disorder without agoraphobia with moderate panic attacks 07/01/2015  . Prediabetes 06/09/2015  . Lichen sclerosus 40/09/6759  . Abdominal pain, LLQ 02/01/2015  . Chronic LBP 09/16/2014  . Clinical  depression 08/19/2014  . Adult hypothyroidism 08/19/2014  . Anxiety disorder 06/23/2014  . Other long term (current) drug therapy 08/05/2012  . Bladder retention 10/25/2008  . Lumbar canal stenosis 10/21/2008  . Current tobacco use 09/19/2008  . Degeneration of intervertebral disc of lumbosacral region 03/22/2008    Past Surgical History:  Procedure Laterality Date  . ABDOMINAL HYSTERECTOMY     heavy bleeding  . bladder stimulator    . CARPAL TUNNEL RELEASE    . CATARACT EXTRACTION W/PHACO Right 03/07/2015   Procedure: CATARACT EXTRACTION PHACO AND INTRAOCULAR LENS PLACEMENT (IOC);  Surgeon: Estill Cotta, MD;  Location: [facility] ORS;  Service: Ophthalmology;  Laterality: Right;  [name] 01:14 AP% 26.4 CDE 33.04 fluid pack lot # [id number]  . CATARACT EXTRACTION W/PHACO Left 03/21/2015   Procedure: CATARACT EXTRACTION PHACO AND INTRAOCULAR LENS PLACEMENT (IOC);  Surgeon: Estill Cotta, MD;  Location: [facility] ORS;  Service: Ophthalmology;  Laterality: Left;  [name] 01:21 AP% 25.6 CDE 39.78 fluid pack lot # [id number]  . HAND SURGERY Right   . INTERSTIM IMPLANT REMOVAL N/A 09/07/2015   Procedure: REMOVAL OF INTERSTIM IMPLANT;  Surgeon: Bjorn Loser, MD;  Location: [facility] ORS;  Service: Urology;  Laterality: N/A;  . OOPHORECTOMY Right    ovarian cyst    Allergies Ciprofloxacin; Oxycodone; Oxycodone-acetaminophen; Oxycodone-acetaminophen; and Sulfa antibiotics  Social History Social History   Tobacco Use  . Smoking status: Current Every Day Smoker    Packs/day: 1.00    Years: 35.00    Pack years: 35.00    Types: Cigarettes  . Smokeless tobacco:  Never Used  . Tobacco comment: Previously smoked 2 ppd  Substance Use Topics  . Alcohol use: No  . Drug use: No   Review of Systems Constitutional: Negative for fever. Cardiovascular: Negative for chest pain. Respiratory: Negative for shortness of breath. Gastrointestinal: Negative for abdominal pain, vomiting and  diarrhea. Musculoskeletal: Positive for back pain Skin: Negative for rash. Neurological: Positive for weakness  All systems negative/normal/unremarkable except as stated in the HPI  ____________________________________________   PHYSICAL EXAM:  VITAL SIGNS: ED Triage Vitals  Enc Vitals Group     BP      Pulse      Resp      Temp      Temp src      SpO2      Weight      Height      Head Circumference      Peak Flow      Pain Score      Pain Loc      Pain Edu?      Excl. in [place]?    Constitutional: Drowsy but responds to verbal stimuli, no distress Eyes: Conjunctivae are normal. Normal extraocular movements. ENT   Head: Normocephalic and atraumatic.   Nose: No congestion/rhinnorhea.   Mouth/Throat: Mucous membranes are moist.   Neck: No stridor. Cardiovascular: Normal rate, regular rhythm. No murmurs, rubs, or gallops. Respiratory: Normal respiratory effort without tachypnea nor retractions. Breath sounds are clear and equal bilaterally. No wheezes/rales/rhonchi. Gastrointestinal: Soft and nontender. Normal bowel sounds Musculoskeletal: Nontender with normal range of motion in extremities. No lower extremity tenderness nor edema. Neurologic:  Normal speech and language. No gross focal neurologic deficits are appreciated.  Skin:  Skin is warm, dry and intact. No rash noted. Psychiatric: Flat affect ____________________________________________  EKG: Interpreted by me.  Sinus rhythm the rate of 94 bpm, normal PR interval, normal QRS, normal QT  ____________________________________________  ED COURSE:  As part of my medical decision making, I reviewed the following data within the [place] History obtained from family if available, nursing notes, old chart and ekg, as well as notes from prior ED visits. Patient presented for possible medication side effect or overdose, we will assess with labs and imaging as indicated at this time.    Procedures ____________________________________________   LABS (pertinent positives/negatives)  Labs Reviewed  CBC WITH DIFFERENTIAL/PLATELET - Abnormal; Notable for the following components:      Result Value   WBC 14.2 (*)    MCV 101.2 (*)    MCHC 29.2 (*)    Neutro Abs 11.4 (*)    Monocytes Absolute 1.4 (*)    Abs Immature Granulocytes 0.12 (*)    All other components within normal limits  COMPREHENSIVE METABOLIC PANEL - Abnormal; Notable for the following components:   Sodium 134 (*)    Potassium 5.3 (*)    Chloride 97 (*)    Glucose, Bld 190 (*)    BUN 31 (*)    Creatinine, Ser 1.62 (*)    Calcium 8.7 (*)    Total Protein 6.3 (*)    Total Bilirubin 1.3 (*)    GFR calc non Af Amer 32 (*)    GFR calc Af Amer 37 (*)    All other components within normal limits  TROPONIN I - Abnormal; Notable for the following components:   Troponin I 0.28 (*)    All other components within normal limits  BLOOD GAS, VENOUS - Abnormal; Notable for the following components:  pH, Ven 7.17 (*)    pCO2, Ven 93 (*)    pO2, Ven <31.0 (*)    Bicarbonate 33.9 (*)    Acid-Base Excess 2.4 (*)    All other components within normal limits  ETHANOL  URINALYSIS, COMPLETE (UACMP) WITH MICROSCOPIC  URINE DRUG SCREEN, QUALITATIVE ([facility] ONLY)  CBG MONITORING, ED    RADIOLOGY Images were viewed by me  Chest x-ray IMPRESSION: No acute cardiopulmonary disease. ____________________________________________  DIFFERENTIAL DIAGNOSIS   Dehydration, electrolyte abnormality, overdose, medication side effect, occult infection  FINAL ASSESSMENT AND PLAN  Acute hypercarbic respiratory failure, accidental medication overdose, elevated troponin   Plan: The patient had presented for altered mental status. Patient's labs did reveal multiple abnormalities including acidemia with evidence of hypercarbic respiratory failure.  Her troponin was also elevated which is likely demand related with no EKG  findings. Patient's imaging is not reveal any acute process.  We have temporarily placed on BiPAP after I gave her a dose of Narcan.  She looks much improved and I did not think this will require a long inpatient stay.  I will discuss with the hospitalist for admission.   Laurence Aly, MD   Note: This note was generated in part or whole with voice recognition software. Voice recognition is usually quite accurate but there are transcription errors that can and very often do occur. I apologize for any typographical errors that were not detected and corrected.     Earleen Newport, MD 11/30/17 2126

## 2017-11-30 NOTE — ED Notes (Signed)
0.4mg  of Narcan IV given, total dosage. Scanner did not work on med. Patient is more alert, answering questions appropriately. RT at bedside putting on bi-pap.

## 2017-12-01 ENCOUNTER — Inpatient Hospital Stay: Payer: PPO

## 2017-12-01 ENCOUNTER — Other Ambulatory Visit: Payer: Self-pay

## 2017-12-01 DIAGNOSIS — J9601 Acute respiratory failure with hypoxia: Secondary | ICD-10-CM

## 2017-12-01 DIAGNOSIS — I499 Cardiac arrhythmia, unspecified: Secondary | ICD-10-CM

## 2017-12-01 LAB — BASIC METABOLIC PANEL
ANION GAP: 6 (ref 5–15)
BUN: 27 mg/dL — ABNORMAL HIGH (ref 8–23)
CALCIUM: 8.5 mg/dL — AB (ref 8.9–10.3)
CO2: 30 mmol/L (ref 22–32)
Chloride: 102 mmol/L (ref 98–111)
Creatinine, Ser: 1.15 mg/dL — ABNORMAL HIGH (ref 0.44–1.00)
GFR, EST AFRICAN AMERICAN: 55 mL/min — AB (ref >60)
GFR, EST NON AFRICAN AMERICAN: 48 mL/min — AB (ref >60)
GLUCOSE: 169 mg/dL — AB (ref 70–99)
POTASSIUM: 5.2 mmol/L — AB (ref 3.5–5.1)
Sodium: 138 mmol/L (ref 135–145)

## 2017-12-01 LAB — BLOOD GAS, ARTERIAL
ACID-BASE EXCESS: 0.8 mmol/L (ref 0.0–2.0)
Acid-Base Excess: 3.2 mmol/L — ABNORMAL HIGH (ref 0.0–2.0)
Bicarbonate: 32.1 mmol/L — ABNORMAL HIGH (ref 20.0–28.0)
Bicarbonate: 29.4 mmol/L — ABNORMAL HIGH (ref 20.0–28.0)
DELIVERY SYSTEMS: POSITIVE
EXPIRATORY PAP: 6
Expiratory PAP: 6
FIO2: 0.4
FIO2: 0.4
INSPIRATORY PAP: 12
INSPIRATORY PAP: 12
LHR: 8 {breaths}/min
MODE: POSITIVE
O2 Saturation: 93.7 %
O2 Saturation: 90.8 %
PCO2 ART: 64 mmHg — AB (ref 32.0–48.0)
PH ART: 7.27 — AB (ref 7.350–7.450)
PH ART: 7.27 — AB (ref 7.350–7.450)
Patient temperature: 37
Patient temperature: 37
pCO2 arterial: 70 mmHg (ref 32.0–48.0)
pO2, Arterial: 79 mmHg — ABNORMAL LOW (ref 83.0–108.0)
pO2, Arterial: 69 mmHg — ABNORMAL LOW (ref 83.0–108.0)

## 2017-12-01 LAB — LACTIC ACID, PLASMA
LACTIC ACID, VENOUS: 0.9 mmol/L (ref 0.5–1.9)
LACTIC ACID, VENOUS: 1.2 mmol/L (ref 0.5–1.9)

## 2017-12-01 LAB — COMPREHENSIVE METABOLIC PANEL
ALK PHOS: 61 U/L (ref 38–126)
ALT: 17 U/L (ref 0–44)
ANION GAP: 5 (ref 5–15)
AST: 29 U/L (ref 15–41)
Albumin: 3.3 g/dL — ABNORMAL LOW (ref 3.5–5.0)
BILIRUBIN TOTAL: 0.5 mg/dL (ref 0.3–1.2)
BUN: 32 mg/dL — ABNORMAL HIGH (ref 8–23)
CALCIUM: 8.5 mg/dL — AB (ref 8.9–10.3)
CO2: 32 mmol/L (ref 22–32)
Chloride: 99 mmol/L (ref 98–111)
Creatinine, Ser: 1.32 mg/dL — ABNORMAL HIGH (ref 0.44–1.00)
GFR, EST AFRICAN AMERICAN: 47 mL/min — AB (ref >60)
GFR, EST NON AFRICAN AMERICAN: 40 mL/min — AB (ref >60)
Glucose, Bld: 137 mg/dL — ABNORMAL HIGH (ref 70–99)
Potassium: 4.8 mmol/L (ref 3.5–5.1)
Sodium: 136 mmol/L (ref 135–145)
TOTAL PROTEIN: 6.1 g/dL — AB (ref 6.5–8.1)

## 2017-12-01 LAB — CBC
HEMATOCRIT: 39.2 % (ref 36.0–46.0)
HEMOGLOBIN: 11.8 g/dL — AB (ref 12.0–15.0)
MCH: 29.8 pg (ref 26.0–34.0)
MCHC: 30.1 g/dL (ref 30.0–36.0)
MCV: 99 fL (ref 80.0–100.0)
Platelets: 198 10*3/uL (ref 150–400)
RBC: 3.96 MIL/uL (ref 3.87–5.11)
RDW: 13.8 % (ref 11.5–15.5)
WBC: 11.5 10*3/uL — ABNORMAL HIGH (ref 4.0–10.5)
nRBC: 0 % (ref 0.0–0.2)

## 2017-12-01 LAB — TROPONIN I: TROPONIN I: 0.32 ng/mL — AB (ref <0.03)

## 2017-12-01 LAB — TSH: TSH: 0.197 u[IU]/mL — AB (ref 0.350–4.500)

## 2017-12-01 LAB — MAGNESIUM: MAGNESIUM: 2.3 mg/dL (ref 1.7–2.4)

## 2017-12-01 LAB — PROCALCITONIN: Procalcitonin: <0.1 ng/mL

## 2017-12-01 LAB — GLUCOSE, CAPILLARY: GLUCOSE-CAPILLARY: 130 mg/dL — AB (ref 70–99)

## 2017-12-01 LAB — MRSA PCR SCREENING: MRSA BY PCR: NEGATIVE

## 2017-12-01 LAB — PHOSPHORUS: Phosphorus: 3.7 mg/dL (ref 2.5–4.6)

## 2017-12-01 MED ORDER — AMIODARONE IV BOLUS ONLY 150 MG/100ML: 150.0000 mg | Freq: Once | INTRAVENOUS | Status: DC

## 2017-12-01 MED ORDER — HYDRALAZINE HCL 20 MG/ML IJ SOLN
10.0000 mg | Freq: Four times a day (QID) | INTRAMUSCULAR | Status: DC | PRN
  Administered 2017-12-02: 10 mg via INTRAVENOUS
  Filled 2017-12-01: qty 1

## 2017-12-01 MED ORDER — DEXTROMETHORPHAN POLISTIREX ER 30 MG/5ML PO SUER
60.0000 mg | Freq: Two times a day (BID) | ORAL | Status: DC
  Administered 2017-12-01 – 2017-12-03 (×5): 60 mg via ORAL
  Filled 2017-12-01 (×6): qty 10

## 2017-12-01 MED ORDER — SODIUM CHLORIDE 0.9 % IV BOLUS
1000.0000 mL | Freq: Once | INTRAVENOUS | Status: AC
  Administered 2017-12-01: 1000 mL via INTRAVENOUS

## 2017-12-01 MED ORDER — DILTIAZEM HCL 25 MG/5ML IV SOLN
10.0000 mg | Freq: Once | INTRAVENOUS | Status: AC
  Administered 2017-12-01: 10 mg via INTRAVENOUS

## 2017-12-01 MED ORDER — METHYLPREDNISOLONE SODIUM SUCC 125 MG IJ SOLR
60.0000 mg | Freq: Every day | INTRAMUSCULAR | Status: DC
  Administered 2017-12-02 – 2017-12-03 (×2): 60 mg via INTRAVENOUS
  Filled 2017-12-01 (×2): qty 2

## 2017-12-01 MED ORDER — SODIUM CHLORIDE 0.9 % IV SOLN
1.0000 g | INTRAVENOUS | Status: DC
  Administered 2017-12-01: 1 g via INTRAVENOUS
  Filled 2017-12-01: qty 1
  Filled 2017-12-01: qty 10

## 2017-12-01 MED ORDER — METHYLPREDNISOLONE SODIUM SUCC 125 MG IJ SOLR
60.0000 mg | Freq: Two times a day (BID) | INTRAMUSCULAR | Status: DC
  Administered 2017-12-01: 60 mg via INTRAVENOUS
  Filled 2017-12-01: qty 2

## 2017-12-01 MED ORDER — SODIUM CHLORIDE 0.9 % IV SOLN
500.0000 mg | INTRAVENOUS | Status: DC
  Administered 2017-12-01: 500 mg via INTRAVENOUS
  Filled 2017-12-01 (×2): qty 500

## 2017-12-01 MED ORDER — GUAIFENESIN ER 600 MG PO TB12
1200.0000 mg | ORAL_TABLET | Freq: Two times a day (BID) | ORAL | Status: DC
  Administered 2017-12-01 – 2017-12-03 (×5): 1200 mg via ORAL
  Filled 2017-12-01 (×5): qty 2

## 2017-12-01 MED ORDER — DILTIAZEM HCL 25 MG/5ML IV SOLN
INTRAVENOUS | Status: AC
  Administered 2017-12-01: 10 mg via INTRAVENOUS
  Filled 2017-12-01: qty 5

## 2017-12-01 MED ORDER — DM-GUAIFENESIN ER 30-600 MG PO TB12
2.0000 | ORAL_TABLET | Freq: Two times a day (BID) | ORAL | Status: DC
  Filled 2017-12-01 (×3): qty 2

## 2017-12-01 MED ORDER — METHYLPREDNISOLONE SODIUM SUCC 125 MG IJ SOLR
60.0000 mg | Freq: Three times a day (TID) | INTRAMUSCULAR | Status: DC
  Administered 2017-12-01: 60 mg via INTRAVENOUS
  Filled 2017-12-01: qty 2

## 2017-12-01 MED ORDER — BUDESONIDE 0.25 MG/2ML IN SUSP
0.2500 mg | Freq: Two times a day (BID) | RESPIRATORY_TRACT | Status: DC
  Administered 2017-12-01 – 2017-12-03 (×5): 0.25 mg via RESPIRATORY_TRACT
  Filled 2017-12-01 (×4): qty 2

## 2017-12-01 MED ORDER — IPRATROPIUM-ALBUTEROL 0.5-2.5 (3) MG/3ML IN SOLN
3.0000 mL | Freq: Four times a day (QID) | RESPIRATORY_TRACT | Status: DC
  Administered 2017-12-01 (×3): 3 mL via RESPIRATORY_TRACT
  Filled 2017-12-01 (×3): qty 3

## 2017-12-01 NOTE — Consult Note (Addendum)
PULMONARY / CRITICAL CARE MEDICINE  Name: [name] MRN: [id number] DOB: 20-Jul-1950    LOS: 1  Referring Provider:  Dr Duane Boston Reason for Referral:  AMS, hypercarbic respiratory failure Brief patient description:  67 y/o female with recent back surgery, presenting with acute change in mental status; initially deemed to be due to an unintentional overdose; now with persistent encephalopath s/p narcan, hypercarbia and acute renal failure.   HPI: This is a 67 year old female with a history of chronic back pain, recent low back surgery on 11/28/2017( or 11/29/17), records unavailable, history of COPD and current smoker who presented to the ED with acute change in mental status.  History is obtained from patient who is still lethargic, EMS and ED records.  EMS was called by patient's friend after finding patient with decreased level of consciousness.  It is alleged that patient had taken an unknown quantity of oxycodone.  She was verbally responsive when EMS arrived.  Patient was transferred to the emergency room and at the ED, she was given 1 dose of Narcan.  Her VBG showed a pH of 7.17, PCO2 of 93 and a PO2 less than 31%.  She was placed on BiPAP and her mental status improved.  Her chemistry showed a creatinine of 1.62 up from 0.93 on October 29, 2017.  She was also mildly hypokalemic and her troponin was mildly elevated at 0.28.  Her chest x-ray was unremarkable.  She is being admitted to the ICU for further management. Upon arrival in the ICU, patient developed A. fib with RVR.  She was given 1 dose of diltiazem and 1 L bolus of normal saline with conversion to normal sinus rhythm.  Repeat ABG shows PCO2 of 70, pH of 7.27 and a PO2 of 79.  Past Medical History:  Diagnosis Date  . Anxiety   . Arthritis   . Chronic back pain   . COPD (chronic obstructive pulmonary disease) ([place])   . Depression   . GERD (gastroesophageal reflux disease)   . Hypertension   . Hypothyroidism   . Pneumonia     RECENT X 3  . PONV (postoperative nausea and vomiting)   . Psoriasis   . Thyroid disease    Past Surgical History:  Procedure Laterality Date  . ABDOMINAL HYSTERECTOMY     heavy bleeding  . bladder stimulator    . CARPAL TUNNEL RELEASE    . CATARACT EXTRACTION W/PHACO Right 03/07/2015   Procedure: CATARACT EXTRACTION PHACO AND INTRAOCULAR LENS PLACEMENT (IOC);  Surgeon: Estill Cotta, MD;  Location: [facility] ORS;  Service: Ophthalmology;  Laterality: Right;  [name] 01:14 AP% 26.4 CDE 33.04 fluid pack lot # [id number]  . CATARACT EXTRACTION W/PHACO Left 03/21/2015   Procedure: CATARACT EXTRACTION PHACO AND INTRAOCULAR LENS PLACEMENT (IOC);  Surgeon: Estill Cotta, MD;  Location: [facility] ORS;  Service: Ophthalmology;  Laterality: Left;  [name] 01:21 AP% 25.6 CDE 39.78 fluid pack lot # [id number]  . HAND SURGERY Right   . INTERSTIM IMPLANT REMOVAL N/A 09/07/2015   Procedure: REMOVAL OF INTERSTIM IMPLANT;  Surgeon: Bjorn Loser, MD;  Location: [facility] ORS;  Service: Urology;  Laterality: N/A;  . OOPHORECTOMY Right    ovarian cyst   Prior to Admission medications   Medication Sig Start Date End Date Taking? Authorizing Provider  amLODipine (NORVASC) 5 MG tablet Take 5 mg by mouth daily.   Yes [provider]  clopidogrel (PLAVIX) 75 MG tablet Take 75 mg by mouth daily.   Yes [provider]  donepezil (  ARICEPT) 5 MG tablet Take 1 tablet (5 mg total) by mouth at bedtime. 09/30/17 11/09/17 Yes Sowles, Drue Stager, MD  empagliflozin (JARDIANCE) 25 MG TABS tablet Take 25 mg by mouth daily.   Yes [provider]  glycopyrrolate (ROBINUL) 1 MG tablet Take 1 mg by mouth 2 (two) times daily.   Yes [provider]  insulin aspart (NOVOLOG FLEXPEN) 100 UNIT/ML FlexPen Inject 12 Units into the skin 2 (two) times daily.   Yes [provider]  insulin aspart (NOVOLOG) 100 UNIT/ML FlexPen Inject 18 Units into the skin daily. At 1700   Yes [provider]  Insulin  Degludec-Liraglutide (XULTOPHY) 100-3.6 UNIT-MG/ML SOPN Inject 50 Units into the skin daily.   Yes [provider]  levETIRAcetam (KEPPRA) 500 MG tablet Take 500 mg by mouth 2 (two) times daily.   Yes [provider]  lipase/protease/amylase (CREON) 12000 units CPEP capsule Take 6,000 Units by mouth 3 (three) times daily before meals.   Yes [provider]  lipase/protease/amylase (CREON) 12000 units CPEP capsule Take 3,000 Units by mouth at bedtime. With snack   Yes [provider]  lisinopril (PRINIVIL,ZESTRIL) 5 MG tablet Take 5 mg by mouth daily.   Yes [provider]  metoprolol succinate (TOPROL-XL) 25 MG 24 hr tablet Take 1 tablet (25 mg total) by mouth daily. 09/30/17  Yes Sowles, Drue Stager, MD  rosuvastatin (CRESTOR) 40 MG tablet Take 1 tablet (40 mg total) by mouth daily. 09/30/17 11/09/17 Yes Steele Sizer, MD  aspirin EC 81 MG tablet Take 81 mg by mouth daily.    [provider]  famotidine (PEPCID) 20 MG tablet Take 1 tablet (20 mg total) by mouth 2 (two) times daily. 09/30/17 10/30/17  Steele Sizer, MD  gabapentin (NEURONTIN) 300 MG capsule Take 1 capsule (300 mg total) by mouth 2 (two) times daily. 09/30/17 10/30/17  Steele Sizer, MD  insulin glargine (LANTUS) 100 UNIT/ML injection Inject 0.1 mLs (10 Units total) into the skin daily. 09/30/17 10/30/17  Steele Sizer, MD  lacosamide 100 MG TABS Take 1 tablet (100 mg total) by mouth 2 (two) times daily. Patient not taking: Reported on 11/09/2017 02/15/17   Fritzi Mandes, MD  promethazine (PHENERGAN) 12.5 MG tablet Take 1 tablet (12.5 mg total) by mouth every 6 (six) hours as needed for nausea or vomiting. Patient not taking: Reported on 11/09/2017 12/04/16   Stark Klein, MD  sertraline (ZOLOFT) 25 MG tablet Take 1 tablet (25 mg total) by mouth daily. Patient not taking: Reported on 11/09/2017 09/30/17   Steele Sizer, MD   Allergies Allergies  Allergen Reactions  . Ciprofloxacin  Hives and Diarrhea  . Oxycodone Diarrhea  . Oxycodone-Acetaminophen Diarrhea  . Oxycodone-Acetaminophen Diarrhea  . Sulfa Antibiotics Rash    Throat, mouth Throat, mouth    Family History Family History  Problem Relation Age of Onset  . Heart disease Mother   . Breast cancer Mother   . Heart attack Mother   . COPD Father   . Stroke Paternal Uncle   . Kidney disease Sister   . Breast cancer Cousin   . Breast cancer Cousin   . Heart attack Maternal Aunt   . Stroke Paternal Grandfather   . Kidney cancer Neg Hx   . Bladder Cancer Neg Hx    Social History  reports that she has been smoking cigarettes. She has a 35.00 pack-year smoking history. She has never used smokeless tobacco. She reports that she does not drink alcohol or use drugs.  Review Of Systems: Unable to obtain as patient is currently lethargic and on continuous BiPAP  VITAL SIGNS: BP 103/90   Pulse 98   Temp 98.1 F (36.7 C) (Oral)   Resp 16   SpO2 93%   HEMODYNAMICS:    VENTILATOR SETTINGS:    INTAKE / OUTPUT: No intake/output data recorded.  PHYSICAL EXAMINATION: General: Acutely ill looking HEENT: Normocephalic and atraumatic, PERRLA, trachea midline, no JVD Neuro: Alert and oriented x2, moves all extremities, follows commands, no focal deficits Cardiovascular: Apical pulse regular, S1-S2, no murmur regurg or gallop, +2 pulses bilaterally, no edema Lungs: Bilateral breath sounds, diminished in all lung fields, no wheezes or rhonchi Abdomen: Nondistended, normal bowel sounds in all 4 quadrants, palpation reveals no organomegaly Musculoskeletal: Lumbar spine incision with dressing intact, positive range of motion in upper and lower extremities Skin: Warm and dry  LABS:  BMET Recent Labs  Lab 11/30/17 1945  NA 134*  K 5.3*  CL 97*  CO2 29  BUN 31*  CREATININE 1.62*  GLUCOSE 190*    Electrolytes Recent Labs  Lab 11/30/17 1945  CALCIUM 8.7*    CBC Recent Labs  Lab 11/30/17 1945   WBC 14.2*  HGB 12.3  HCT 42.1  PLT 221    Coag's No results for input(s): APTT, INR in the last 168 hours.  Sepsis Markers No results for input(s): LATICACIDVEN, PROCALCITON, O2SATVEN in the last 168 hours.  ABG No results for input(s): PHART, PCO2ART, PO2ART in the last 168 hours.  Liver Enzymes Recent Labs  Lab 11/30/17 1945  AST 36  ALT 16  ALKPHOS 64  BILITOT 1.3*  ALBUMIN 3.5    Cardiac Enzymes Recent Labs  Lab 11/30/17 1945  TROPONINI 0.28*    Glucose Recent Labs  Lab 11/30/17 2341  GLUCAP 142*    Imaging Dg Chest 1 View  Result Date: 11/30/2017 CLINICAL DATA:  Per EMS report, patient had back surgery two days ago and was sent home with Oxycodone 5mg . Patient's friend found her altered today and called EMS. Patient was unable to state how many Oxycodone she took today.pt low BP and O2 stats in room EXAM: CHEST  1 VIEW COMPARISON:  11/20/2017 FINDINGS: Cardiac silhouette is normal in size. No mediastinal or hilar masses. No convincing adenopathy. Lung volumes relatively low. Prominent bronchovascular markings most evident bases. Mild additional opacity at the bases likely due to subsegmental atelectasis. Lungs otherwise clear. No convincing pleural effusion. No pneumothorax. Skeletal structures are demineralized but grossly intact. IMPRESSION: No acute cardiopulmonary disease. Electronically Signed   By: Lajean Manes M.D.   On: 11/30/2017 19:58    STUDIES:  Last 2 D echo 06/07/2016 with  AN ESTIMATED EF = 55 %  CULTURES: None  ANTIBIOTICS: None  SIGNIFICANT EVENTS: 10/25>Lumbar surgery at [place] 10/26: ED with AMS  LINES/TUBES: PIVs Foley  DISCUSSION: 67 year old female presenting with acute encephalopathy secondary to hypercarbic respiratory failure status post lumbar spine surgery, acute renal failure secondary to dehydration,  mildly elevated troponin due to demand ischemia and unintentional overdose likely due to a combination of  dehydration and inability to accurately dose medications.  ASSESSMENT Acute hypercarbic/hypoxic respiratory failure-due to a combination of persistent smoking, recent surgery and narcotic use Acute renal failure secondary to volume depletion Acute encephalopathy due to hypercarbia and dehydration Hyperkalemia Unintentional overdose versus medication side effects precipitated by dehydration Chronic back pain status post L4-L5 laminectomy Tobacco use disorder Mildly elevated troponin without EKG changes New onset cardiac arrhythmia- A. fib  into SVT and now in sinus rhythm; likely triggered by volume depletion and electrolyte abnormalities Hypothyroidism History of hypertension  PLAN Continues BiPAP and titrate to nasal cannula as tolerated Nebulized bronchodilators and steroids Solu-Medrol 60 mg IV every 6 hours IV hydration Monitor and correct electrolyte imbalances Hemodynamic monitoring per ICU protocol Neurochecks per protocol Cycle cardiac enzymes 2D echo Stat EKG-reviewed  Best Practice: Code Status: Full code Diet: N.p.o. while on BiPAP GI prophylaxis: Not indicated VTE prophylaxis: Subcu heparin  FAMILY  - Updates: No family at bedside.  Will update when available.  [facility] ANP-BC Pulmonary and Critical Care Medicine [facility] Pager [phone] or [phone]  NB: This document was prepared using Dragon voice recognition software and may include unintentional dictation errors.    12/01/2017, 12:38 AM   I agree with the documented  Patient is admitted with altered mental status due to narcotic overmedication negative CT head for acute intracranial abnormalities -Improved mental status awake and following commands  Acute hypercarbic respiratory failure was poor work of breathing due to altered mental status (improved).  Tolerating nasal cannula -Monitor O2 sat, work of breathing and ABG  Atelectasis and pneumonia.  Bibasilar airspace  disease -Cover for CAP with Rocephin and Zithromax -Monitor CXR + CBC + FiO2 requirement  COPD with exacerbation -optimize bronchodilators + systemic steroid + inhaled steroids + empiric antibiotics  AKI with mild hyperkalemia due to intravascular volume depletion -Optimize hydration, avoid nephrotoxins, monitor renal panel and urine output

## 2017-12-01 NOTE — Progress Notes (Signed)
Pt being transferred to room 157. Report called to Roselyn Reef, Therapist, sports. Pt and belongings transferred to room 157 without incident.

## 2017-12-01 NOTE — Progress Notes (Signed)
Transported to ct on bipap without incident

## 2017-12-01 NOTE — Progress Notes (Signed)
[place] [facility] [place] NAME: [name]    MR#:  [id number]  DATE OF BIRTH:  April 02, 1950  SUBJECTIVE:  CHIEF COMPLAINT:   Chief Complaint  Patient presents with  . Drug Overdose   Recent back surgery, came with altered mental status and CO2 retention with respiratory distress.  Noted to have oxycodone overdose.  He initially required BiPAP in stepdown unit, improved now and transferred to floor.  REVIEW OF SYSTEMS:  CONSTITUTIONAL: No fever, have fatigue or weakness.  EYES: No blurred or double vision.  EARS, NOSE, AND THROAT: No tinnitus or ear pain.  RESPIRATORY: No cough, shortness of breath, wheezing or hemoptysis.  CARDIOVASCULAR: No chest pain, orthopnea, edema.  GASTROINTESTINAL: No nausea, vomiting, diarrhea or abdominal pain.  GENITOURINARY: No dysuria, hematuria.  ENDOCRINE: No polyuria, nocturia,  HEMATOLOGY: No anemia, easy bruising or bleeding SKIN: No rash or lesion. MUSCULOSKELETAL: No joint pain or arthritis.   NEUROLOGIC: No tingling, numbness, weakness.  PSYCHIATRY: No anxiety or depression.   ROS  DRUG ALLERGIES:   Allergies  Allergen Reactions  . Ciprofloxacin Hives and Diarrhea  . Oxycodone Diarrhea  . Oxycodone-Acetaminophen Diarrhea  . Oxycodone-Acetaminophen Diarrhea  . Sulfa Antibiotics Rash    Throat, mouth Throat, mouth    VITALS:  Blood pressure (!) 169/80, pulse 99, temperature 98.1 F (36.7 C), temperature source Oral, resp. rate 20, height 5\' 2"  (1.575 m), weight 83.1 kg, SpO2 95 %.  PHYSICAL EXAMINATION:  GENERAL:  67 y.o.-year-old patient lying in the bed with no acute distress.  EYES: Pupils equal, round, reactive to light and accommodation. No scleral icterus. Extraocular muscles intact.  HEENT: Head atraumatic, normocephalic. Oropharynx and nasopharynx clear.  NECK:  Supple, no jugular venous distention. No thyroid enlargement, no tenderness.  LUNGS: Normal breath sounds bilaterally, no  wheezing, rales,rhonchi or crepitation. No use of accessory muscles of respiration.  CARDIOVASCULAR: S1, S2 normal. No murmurs, rubs, or gallops.  ABDOMEN: Soft, nontender, nondistended. Bowel sounds present. No organomegaly or mass.  Lower back tenderness. EXTREMITIES: No pedal edema, cyanosis, or clubbing.  NEUROLOGIC: Cranial nerves II through XII are intact. Muscle strength 4/5 in all extremities. Sensation intact. Gait not checked.  PSYCHIATRIC: The patient is alert and oriented x 3.  SKIN: No obvious rash, lesion, or ulcer.   Physical Exam LABORATORY PANEL:   CBC Recent Labs  Lab 12/01/17 0150  WBC 11.5*  HGB 11.8*  HCT 39.2  PLT 198   ------------------------------------------------------------------------------------------------------------------  Chemistries  Recent Labs  Lab 12/01/17 0150 12/01/17 0916  NA 136 138  K 4.8 5.2*  CL 99 102  CO2 32 30  GLUCOSE 137* 169*  BUN 32* 27*  CREATININE 1.32* 1.15*  CALCIUM 8.5* 8.5*  MG 2.3  --   AST 29  --   ALT 17  --   ALKPHOS 61  --   BILITOT 0.5  --    ------------------------------------------------------------------------------------------------------------------  Cardiac Enzymes Recent Labs  Lab 11/30/17 1945 12/01/17 0916  TROPONINI 0.28* 0.32*   ------------------------------------------------------------------------------------------------------------------  RADIOLOGY:  Dg Chest 1 View  Result Date: 11/30/2017 CLINICAL DATA:  Per EMS report, patient had back surgery two days ago and was sent home with Oxycodone 5mg . Patient's friend found her altered today and called EMS. Patient was unable to state how many Oxycodone she took today.pt low BP and O2 stats in room EXAM: CHEST  1 VIEW COMPARISON:  11/20/2017 FINDINGS: Cardiac silhouette is normal in size. No mediastinal or hilar masses. No convincing  adenopathy. Lung volumes relatively low. Prominent bronchovascular markings most evident bases. Mild  additional opacity at the bases likely due to subsegmental atelectasis. Lungs otherwise clear. No convincing pleural effusion. No pneumothorax. Skeletal structures are demineralized but grossly intact. IMPRESSION: No acute cardiopulmonary disease. Electronically Signed   By: Lajean Manes M.D.   On: 11/30/2017 19:58   Ct Head Wo Contrast  Result Date: 12/01/2017 CLINICAL DATA:  67 year old female with altered mental status. EXAM: CT HEAD WITHOUT CONTRAST TECHNIQUE: Contiguous axial images were obtained from the base of the skull through the vertex without intravenous contrast. COMPARISON:  Head CT dated 02/02/2013 FINDINGS: Brain: The ventricles and sulci appropriate size for patient's age.5 minimal periventricular and deep white matter chronic microvascular ischemic changes noted. There is no acute intracranial hemorrhage. No mass effect or midline shift. No extra-axial fluid collection. Vascular: No hyperdense vessel or unexpected calcification. Skull: Normal. Negative for fracture or focal lesion. Sinuses/Orbits: Chronic right maxillary sinus disease. The remainder of the visualized paranasal sinuses and mastoid air cells are clear. Other: None IMPRESSION: 1. No acute intracranial pathology. 2. Chronic right maxillary sinus disease. Electronically Signed   By: Anner Crete M.D.   On: 12/01/2017 05:00   Ct Lumbar Spine Wo Contrast  Result Date: 12/01/2017 CLINICAL DATA:  67 y/o F; back surgery 11/29/2017. Acute change in mental status. EXAM: CT LUMBAR SPINE WITHOUT CONTRAST TECHNIQUE: Multidetector CT imaging of the lumbar spine was performed without intravenous contrast administration. Multiplanar CT image reconstructions were also generated. COMPARISON:  08/19/2006 lumbar spine MRI FINDINGS: Segmentation: 5 lumbar type vertebrae. Alignment: Normal. Vertebrae: L4 and L5 vertebral body laminectomy postsurgical changes. No acute fracture, bony erosive changes, or suspicious osseous lesion. Paraspinal  and other soft tissues: There is edema with several foci of air within the subcutaneous fat, paraspinal muscles, and laminectomy bed at the L4 and L5 levels. Additionally there are a few punctate foci of air within the dorsal epidural space at L3 and L4 levels. Abdominal aortic calcific atherosclerosis. Partially visualized dependent atelectasis of the lungs. Disc levels: Mild multilevel loss of intervertebral disc space height T12 through L4 and L5-S1 with vacuum phenomenon. Small multilevel endplate marginal osteophytes. Moderate facet arthrosis at L3 through S1. Disc and facet degenerative changes result in mild bony foraminal stenosis at the L3 through S1 levels. No high-grade bony canal stenosis. IMPRESSION: 1. L4 and L5 vertebral body laminectomy postsurgical changes. No acute osseous abnormality identified. 2. Postsurgical changes within the L4-5 posterior soft tissues and laminectomy bed. Infection not excluded, clinical correlation recommended. Electronically Signed   By: Kristine Garbe M.D.   On: 12/01/2017 04:54    ASSESSMENT AND PLAN:   Active Problems:   Acute respiratory failure ([place])  1.  Acute respiratory failure with hypoxia and and hypercapnia.  This is likely multifactorial, related to respiratory depression due to side effects from pain medications and other sedatives,  ARF, COPD and possibly obstructive sleep apnea.   Initially required treatment with BiPAP, oxygen and avoid use of sedative meds.  Patient would likely need sleep studies as outpatient, after discharge. Respiratory status improved now on nasal cannula oxygen, try to taper back to room air. 2.  Acute renal failure, likely prerenal.    gentle IV hydration and monitor kidney function closely.  Avoid nephrotoxic medications. 3.  Acute encephalopathy, related to acute respiratory failure and acute renal failure.  We will continue to monitor clinically closely while treating the underlying disease. Improved,  fully alert and oriented now. 4.  Elevated troponin level, at 0.28.  This is likely due to demand ischemia, from acute respiratory failure.  We will continue to monitor patient on telemetry and follow troponin levels to rule out ACS.  Will check 2D echo to further evaluate cardiac function. 5.  Tobacco abuse.  Smoking cessation will be discussed with patient in detail. 6.  COPD with acute exacerbation.  We will treat with IV steroids, nebulizers, oxygen and BiPAP. 7.  Hypertension, stable, will resume home medications. 8.  Status post kyphoplasty 2 days ago.  Will have PT/OT eval and treat the patient.  All the records are reviewed and case discussed with Care Management/Social Workerr. Management plans discussed with the patient, family and they are in agreement.  CODE STATUS: Full.  TOTAL TIME TAKING CARE OF THIS PATIENT: 35 minutes.     POSSIBLE D/C IN 1-2 DAYS, DEPENDING ON CLINICAL CONDITION.   Vaughan Basta M.D on 12/01/2017   Between 7am to 6pm - Pager - [phone]  After 6pm go to [email or web address] - password [email or web address] [facility]  Office  [phone]  CC: Primary care physician; Mikey College, NP  Note: This dictation was prepared with Dragon dictation along with smaller phrase technology. Any transcriptional errors that result from this process are unintentional.

## 2017-12-01 NOTE — Progress Notes (Signed)
Call NP Patria Mane, to inform that patient is now in Afib with HR ranging from 80 to 170. Verbal order for 10mg  Diltiazem IV once, order to be entered and carried out by this RN.

## 2017-12-01 NOTE — Progress Notes (Signed)
Patient transferred down from CCU to room 157. A&O x4. On 4L O2. Oriented to room, call light, TV and bed controls. Bed alarm on for safety.

## 2017-12-02 ENCOUNTER — Inpatient Hospital Stay
Admit: 2017-12-02 | Discharge: 2017-12-02 | Disposition: A | Discharge status: Home / Self Care | Payer: PPO | Attending: Internal Medicine | Admitting: Internal Medicine

## 2017-12-02 ENCOUNTER — Inpatient Hospital Stay: Payer: PPO

## 2017-12-02 DIAGNOSIS — I4891 Unspecified atrial fibrillation: Secondary | ICD-10-CM

## 2017-12-02 LAB — BASIC METABOLIC PANEL
Anion gap: 6 (ref 5–15)
BUN: 26 mg/dL — ABNORMAL HIGH (ref 8–23)
CHLORIDE: 101 mmol/L (ref 98–111)
CO2: 32 mmol/L (ref 22–32)
CREATININE: 0.77 mg/dL (ref 0.44–1.00)
Calcium: 9.2 mg/dL (ref 8.9–10.3)
GFR calc non Af Amer: >60 mL/min (ref >60)
GLUCOSE: 159 mg/dL — AB (ref 70–99)
Potassium: 4.3 mmol/L (ref 3.5–5.1)
Sodium: 139 mmol/L (ref 135–145)

## 2017-12-02 LAB — CBC WITH DIFFERENTIAL/PLATELET
Abs Immature Granulocytes: 0.09 10*3/uL — ABNORMAL HIGH (ref 0.00–0.07)
Basophils Absolute: 0 10*3/uL (ref 0.0–0.1)
Basophils Relative: 0 %
EOS PCT: 0 %
Eosinophils Absolute: 0 10*3/uL (ref 0.0–0.5)
HEMATOCRIT: 37.6 % (ref 36.0–46.0)
HEMOGLOBIN: 11.7 g/dL — AB (ref 12.0–15.0)
Immature Granulocytes: 1 %
LYMPHS ABS: 0.9 10*3/uL (ref 0.7–4.0)
LYMPHS PCT: 10 %
MCH: 29.9 pg (ref 26.0–34.0)
MCHC: 31.1 g/dL (ref 30.0–36.0)
MCV: 96.2 fL (ref 80.0–100.0)
MONO ABS: 0.6 10*3/uL (ref 0.1–1.0)
MONOS PCT: 7 %
NRBC: 0 % (ref 0.0–0.2)
Neutro Abs: 7.1 10*3/uL (ref 1.7–7.7)
Neutrophils Relative %: 82 %
Platelets: 214 10*3/uL (ref 150–400)
RBC: 3.91 MIL/uL (ref 3.87–5.11)
RDW: 13.3 % (ref 11.5–15.5)
WBC: 8.7 10*3/uL (ref 4.0–10.5)

## 2017-12-02 LAB — TROPONIN I
TROPONIN I: 0.28 ng/mL — AB (ref <0.03)
Troponin I: 0.33 ng/mL (ref <0.03)
Troponin I: 0.27 ng/mL (ref <0.03)

## 2017-12-02 LAB — ECHOCARDIOGRAM COMPLETE
Height: 62 in
Weight: 3040.58 oz

## 2017-12-02 LAB — GLUCOSE, CAPILLARY: GLUCOSE-CAPILLARY: 120 mg/dL — AB (ref 70–99)

## 2017-12-02 LAB — PROCALCITONIN: Procalcitonin: <0.1 ng/mL

## 2017-12-02 MED ORDER — IPRATROPIUM-ALBUTEROL 0.5-2.5 (3) MG/3ML IN SOLN
3.0000 mL | Freq: Three times a day (TID) | RESPIRATORY_TRACT | Status: DC
  Administered 2017-12-02 – 2017-12-03 (×5): 3 mL via RESPIRATORY_TRACT
  Filled 2017-12-02 (×5): qty 3

## 2017-12-02 MED ORDER — METOPROLOL TARTRATE 5 MG/5ML IV SOLN
10.0000 mg | Freq: Once | INTRAVENOUS | Status: AC
  Administered 2017-12-02: 10 mg via INTRAVENOUS
  Filled 2017-12-02: qty 10

## 2017-12-02 MED ORDER — LEVOTHYROXINE SODIUM 100 MCG PO TABS
100.0000 ug | ORAL_TABLET | Freq: Every day | ORAL | Status: DC
  Administered 2017-12-03: 100 ug via ORAL
  Filled 2017-12-02: qty 1

## 2017-12-02 MED ORDER — LISINOPRIL 20 MG PO TABS
20.0000 mg | ORAL_TABLET | Freq: Every day | ORAL | Status: DC
  Administered 2017-12-02 – 2017-12-03 (×2): 20 mg via ORAL
  Filled 2017-12-02 (×2): qty 1

## 2017-12-02 MED ORDER — DILTIAZEM HCL ER COATED BEADS 120 MG PO CP24
120.0000 mg | ORAL_CAPSULE | Freq: Every day | ORAL | Status: DC
  Administered 2017-12-02 – 2017-12-03 (×2): 120 mg via ORAL
  Filled 2017-12-02 (×2): qty 1

## 2017-12-02 MED ORDER — OXYCODONE-ACETAMINOPHEN 5-325 MG PO TABS
1.0000 | ORAL_TABLET | Freq: Three times a day (TID) | ORAL | Status: DC | PRN
  Administered 2017-12-02 – 2017-12-03 (×3): 1 via ORAL
  Filled 2017-12-02 (×3): qty 1

## 2017-12-02 MED ORDER — IPRATROPIUM-ALBUTEROL 0.5-2.5 (3) MG/3ML IN SOLN: 3.0000 mL | Freq: Four times a day (QID) | RESPIRATORY_TRACT | Status: DC | PRN

## 2017-12-02 NOTE — Evaluation (Signed)
Occupational Therapy Evaluation Patient Details Name: [name] MRN: [id number] DOB: 1950/08/10 Today's Date: 12/02/2017    History of Present Illness [name] is a 67 y/o female with a known history of acute renal failure, COPD, HTN, acute encephalopathy, and chronic back pain. Pt admitted this date for acute respiratory failure.  Pt with recent back surgery on 11/28/17.     Clinical Impression   Pt seen for OT evaluation this date. Pt is a 67 y/o female who was admitted for acute respiratory failure. Prior to this hospital admittance, pt enjoyed watching tv and doing crossword puzzles. Pt lives alone in an apartment without any steps to enter and will have intermittent assistance from family when she returns home. OT cleared pt participation with RN for OT session due to high Troponin levels. Currently, pt demonstrates impairments in strength, precaution knowledge, pain, ROM, safety awareness, activity tolerance and decreased knowledge of DME/AE resulting in MIN A with VC for bed mobility and CGA for sit<>stand transfers. During session, Pt HR fluctuated from 100-107 and O2 sats ranged from 95%-97% with movement from bed to chair. Pt instructed in energy conservation techniques including activity pacing and pursed lip breathing to increase her activity tolerance, safety and independence during ADL tasks. Pt also instructed in back precautions. Pt will benefit from skilled OT services to address noted impairments and functional deficits in order to maximize safety and independence and minimize caregiver burden and falls risk. OT recommends HHOT with short term SUP 24/7 upon discharge.     Follow Up Recommendations  Home health OT    Equipment Recommendations  3 in 1 bedside commode    Recommendations for Other Services       Precautions / Restrictions Precautions Precautions: Back;Fall;Other (comment) Precaution Booklet Issued: Yes Precaution Comments: reviewed no bending, lifting,  twisting  Weight Bearing Restrictions: No      Mobility Bed Mobility Overal bed mobility: Needs Assistance Bed Mobility: Sidelying to Sit   Sidelying to sit: Min assist       General bed mobility comments: VC needed to adhere to back precautions.  Transfers Overall transfer level: Needs assistance Equipment used: Rolling walker (2 wheeled) Transfers: Sit to/from Stand Sit to Stand: Min guard              Balance Overall balance assessment: Needs assistance Sitting-balance support: Feet supported;No upper extremity supported Sitting balance-Leahy Scale: Good     Standing balance support: Bilateral upper extremity supported Standing balance-Leahy Scale: Poor      General Comments: Pt HR 100 supine, 107 standing and 106 during ambulation to chair. O2 sats: 97% supine, 95% EOB and 95% EOB to recliner on 2L of O2.                       ADL either performed or assessed with clinical judgement   ADL Overall ADL's : Needs assistance/impaired Eating/Feeding: Independent   Grooming: Sitting;Supervision/safety   Upper Body Bathing: Sitting;Supervision/ safety   Lower Body Bathing: Moderate assistance;Minimal assistance;Sit to/from stand   Upper Body Dressing : Supervision/safety;Sitting   Lower Body Dressing: Minimal assistance;Moderate assistance;Sit to/from stand   Toilet Transfer: BSC;Min guard           Functional mobility during ADLs: Min guard;Rolling walker       Vision Baseline Vision/History: Wears glasses Wears Glasses: Reading only Patient Visual Report: No change from baseline       Perception     Praxis  Pertinent Vitals/Pain Pain Assessment: 0-10 Pain Score: 2  Pain Location: in lower back when moving head of bed down to level position Pain Descriptors / Indicators: Grimacing Pain Intervention(s): Limited activity within patient's tolerance;Monitored during session;Repositioned     Hand Dominance     Extremity/Trunk  Assessment Upper Extremity Assessment Upper Extremity Assessment: Overall WFL for tasks assessed   Lower Extremity Assessment Lower Extremity Assessment: Defer to PT evaluation       Communication Communication Communication: No difficulties   Cognition Arousal/Alertness: Awake/alert Behavior During Therapy: WFL for tasks assessed/performed Overall Cognitive Status: Within Functional Limits for tasks assessed                                 General Comments: Pt mildly impulsive effecting her safety.    General Comments  Pt HR 100 supine, 107 standing and 106 during ambulation to chair. O2 sats: 97% supine, 95% EOB and 95% EOB to recliner on 2L of O2.    Exercises Other Exercises Other Exercises: Pt educated in and handout provided for energy conservation strategies such as activity pacing, taking rest breaks, prioritizing tasks,  and using pursed lip breathing  Other Exercises: Pt educated in and handout provided for back precautions.   Shoulder Instructions      Home Living Family/patient expects to be discharged to:: Private residence Living Arrangements: Alone Available Help at Discharge: Family;Available PRN/intermittently Type of Home: Apartment Home Access: Level entry     Home Layout: One level     Bathroom Shower/Tub: Teacher, early years/pre: Standard     Home Equipment: Grab bars - tub/shower;Walker - 4 wheels          Prior Functioning/Environment Level of Independence: Independent with assistive device(s)        Comments: Pt was independent with ADLs. Intermittently used [id number] for mobility in home and community.        OT Problem List: Decreased strength;Decreased knowledge of precautions;Pain;Decreased activity tolerance;Decreased knowledge of use of DME or AE;Decreased range of motion;Decreased safety awareness      OT Treatment/Interventions: Self-care/ADL training;Therapeutic exercise;Patient/family education;Energy  conservation;DME and/or AE instruction;Therapeutic activities    OT Goals(Current goals can be found in the care plan section) Acute Rehab OT Goals Patient Stated Goal: to get back home  OT Goal Formulation: With patient Time For Goal Achievement: 12/16/17 Potential to Achieve Goals: Good ADL Goals Pt Will Perform Lower Body Dressing: with supervision;sit to/from stand;with adaptive equipment Additional ADL Goal #1: Pt will utilize 2-3 energy conservation techniques while completing ADL tasks. Additional ADL Goal #2: Pt will independently demonstrate 3/3  back precautions when completing ADL and IADL tasks. Additional ADL Goal #3: Pt will successfully utilize DME/AE equipment to maintain correct positioning for back precautions when completing ADL tasks.  OT Frequency: Min 1X/week   Barriers to D/C:            Co-evaluation              AM-PAC PT "6 Clicks" Daily Activity     Outcome Measure Help from another person eating meals?: None Help from another person taking care of personal grooming?: None Help from another person toileting, which includes using toliet, bedpan, or urinal?: A Little Help from another person bathing (including washing, rinsing, drying)?: A Little Help from another person to put on and taking off regular upper body clothing?: None Help from another person to put on and  taking off regular lower body clothing?: A Little 6 Click Score: 21   End of Session Equipment Utilized During Treatment: Gait belt;Rolling walker  Activity Tolerance: Patient tolerated treatment well Patient left: in chair;with call bell/phone within reach;with chair alarm set;with nursing/sitter in room  OT Visit Diagnosis: Other abnormalities of gait and mobility (R26.89);Pain;History of falling (Z91.81) Pain - Right/Left: Right(back)                Time: 6002-9847 OT Time Calculation (min): 51 min Charges:    Jadene Pierini OTS  12/02/2017, 11:37 AM

## 2017-12-02 NOTE — Evaluation (Signed)
Physical Therapy Evaluation Patient Details Name: [name] MRN: [id number] DOB: 1950-06-28 Today's Date: 12/02/2017   History of Present Illness  [name] is a 67 y/o female with a known history of acute renal failure, COPD, HTN, acute encephalopathy, and chronic back pain. Pt admitted this date for acute respiratory failure.  Pt with recent back surgery on 11/28/17.     Clinical Impression  Pt admitted with above diagnosis. Pt currently with functional limitations due to the deficits listed below (see PT Problem List). [name] was aware of her back precautions.  She demonstrates guarded posture with transfers and ambulation but demonstrates safe technique without signs of instability. SpO2 remained at or above 93% on RA while ambulating. RN notified. HR stable. Discussed proper positioning techniques given her recent back surgery. Pt will benefit from skilled PT to increase their independence and safety with mobility to allow discharge to the venue listed below.      Follow Up Recommendations Home health PT    Equipment Recommendations  None recommended by PT    Recommendations for Other Services       Precautions / Restrictions Precautions Precautions: Back;Fall;Other (comment) Precaution Booklet Issued: No(handout already in room from OT eval) Precaution Comments: reviewed no bending, lifting, twisting Required Braces or Orthoses: (pt reports she was not given a brace) Restrictions Weight Bearing Restrictions: No      Mobility  Bed Mobility Overal bed mobility: Needs Assistance Bed Mobility: Sidelying to Sit   Sidelying to sit: Min assist       General bed mobility comments: Pt sitting in chair at start and end of session  Transfers Overall transfer level: Needs assistance Equipment used: Rolling walker (2 wheeled) Transfers: Sit to/from Stand Sit to Stand: Supervision         General transfer comment: Supervision for safety.  Pt demonstrates proper and safe  technique with guarded posture.   Ambulation/Gait Ambulation/Gait assistance: Supervision Gait Distance (Feet): 170 Feet Assistive device: Rolling walker (2 wheeled) Gait Pattern/deviations: Step-through pattern;Decreased step length - right;Decreased step length - left Gait velocity: slightly decreased   General Gait Details: Guarded posture but pt remains steady with use of RW.  SpO2 remained at or above 93% on RA while ambulating.  HR stable.    Stairs            Wheelchair Mobility    Modified Rankin (Stroke Patients Only)       Balance Overall balance assessment: Needs assistance Sitting-balance support: No upper extremity supported;Feet supported Sitting balance-Leahy Scale: Good     Standing balance support: No upper extremity supported;During functional activity Standing balance-Leahy Scale: Fair Standing balance comment: Pt able to stand statically without UE support but uses RW when ambulating                             Pertinent Vitals/Pain Pain Assessment: Faces Pain Score: 2  Faces Pain Scale: Hurts little more Pain Location: lower back Pain Descriptors / Indicators: Grimacing;Guarding Pain Intervention(s): Limited activity within patient's tolerance;Monitored during session    Home Living Family/patient expects to be discharged to:: Private residence Living Arrangements: Alone Available Help at Discharge: Family;Available PRN/intermittently Type of Home: Apartment Home Access: Level entry     Home Layout: One level Home Equipment: Grab bars - tub/shower;Walker - 4 wheels      Prior Function Level of Independence: Independent with assistive device(s)         Comments:  Pt was independent with ADLs. Intermittently used [id number] for mobility in home and community.     Hand Dominance        Extremity/Trunk Assessment   Upper Extremity Assessment Upper Extremity Assessment: Defer to OT evaluation    Lower Extremity  Assessment Lower Extremity Assessment: RLE deficits/detail RLE Deficits / Details: Strength grossly 4-/5 which pt reports is baseline due to h/o chronic back issues RLE Sensation: WNL    Cervical / Trunk Assessment Cervical / Trunk Assessment: Other exceptions Cervical / Trunk Exceptions: s/p back surgery on 11/28/17  Communication   Communication: No difficulties  Cognition Arousal/Alertness: Awake/alert Behavior During Therapy: [facility] for tasks assessed/performed Overall Cognitive Status: Within Functional Limits for tasks assessed                                 General Comments: Pt mildly impulsive effecting her safety.       General Comments General comments (skin integrity, edema, etc.): Discussed positioning with pillow between knees when lying sidelying to sleep and to keep feet supported when in sitting.     Exercises Other Exercises Other Exercises: Encouraged pt to ambulate at least 3x/day with nursing staff Other Exercises: Pt educated in and handout provided for back precautions.   Assessment/Plan    PT Assessment Patient needs continued PT services  PT Problem List Pain;Decreased strength;Decreased balance;Decreased knowledge of precautions       PT Treatment Interventions DME instruction;Gait training;Functional mobility training;Therapeutic activities;Therapeutic exercise;Balance training;Neuromuscular re-education;Patient/family education    PT Goals (Current goals can be found in the Care Plan section)  Acute Rehab PT Goals Patient Stated Goal: to get back home  PT Goal Formulation: With patient Time For Goal Achievement: 12/16/17 Potential to Achieve Goals: Good    Frequency Min 2X/week   Barriers to discharge        Co-evaluation               AM-PAC PT "6 Clicks" Daily Activity  Outcome Measure Difficulty turning over in bed (including adjusting bedclothes, sheets and blankets)?: A Lot Difficulty moving from lying on back to  sitting on the side of the bed? : A Lot Difficulty sitting down on and standing up from a chair with arms (e.g., wheelchair, bedside commode, etc,.)?: A Little Help needed moving to and from a bed to chair (including a wheelchair)?: A Little Help needed walking in hospital room?: A Little   6 Click Score: 13    End of Session Equipment Utilized During Treatment: Gait belt;Oxygen Activity Tolerance: Patient tolerated treatment well Patient left: in chair;with call bell/phone within reach;with chair alarm set Nurse Communication: Mobility status;Other (comment)(SpO2) PT Visit Diagnosis: Other abnormalities of gait and mobility (R26.89);Pain Pain - Right/Left: (lower) Pain - part of body: (back)    Time: 1020-1036 PT Time Calculation (min) (ACUTE ONLY): 16 min   Charges:   PT Evaluation $PT Eval Moderate Complexity: 1 Mod         Collie Siad PT, DPT 12/02/2017, 12:06 PM

## 2017-12-02 NOTE — Consult Note (Signed)
Cardiology Consultation Note    Patient ID: [name], MRN: [id number], DOB/AGE: 1951-01-13 67 y.o. Admit date: 11/30/2017   Date of Consult: 12/02/2017 Primary Physician: Mikey College, NP Primary Cardiologist: Dr. Nehemiah Massed  Chief Complaint: altered mental status Reason for Consultation: afib with rvr/abnormal troponin Requesting MD: Dr. Anselm Jungling  HPI: [name] is a 67 y.o. female with history of recent back surgery , history of copd admitted with altered mental status felt to be secondary to unintentional narcotic overdosse with persistnet encephalopathy after reveral with narcan. She was noted to have hypercarbia and acute renal failure. She had her calcium channel blockers held on admission due to hypotension. Her initial pcow on vbg was 93. She had acute on chronic renal insufiency. Mildly elevated serum trooponin. She developed afib with rvr after a coughing spell which responded to resumption of her po cardizem cd 120 mg and iv bolus of metoprolol. She waas transferred to telemetry after stabilization of her hypercapnea and heart rate. She had a tansient recurence of her afib with rvr followed by resummptom of her nsr. She is currently hemodynamically stable. No chest pain.   Past Medical History:  Diagnosis Date  . Anxiety   . Arthritis   . Chronic back pain   . COPD (chronic obstructive pulmonary disease) ([employer])   . Depression   . GERD (gastroesophageal reflux disease)   . Hypertension   . Hypothyroidism   . Pneumonia    RECENT X 3  . PONV (postoperative nausea and vomiting)   . Psoriasis   . Thyroid disease       Surgical History:  Past Surgical History:  Procedure Laterality Date  . ABDOMINAL HYSTERECTOMY     heavy bleeding  . bladder stimulator    . CARPAL TUNNEL RELEASE    . CATARACT EXTRACTION W/PHACO Right 03/07/2015   Procedure: CATARACT EXTRACTION PHACO AND INTRAOCULAR LENS PLACEMENT (IOC);  Surgeon: Estill Cotta, MD;  Location: [facility] ORS;   Service: Ophthalmology;  Laterality: Right;  [name] 01:14 AP% 26.4 CDE 33.04 fluid pack lot # [id number]  . CATARACT EXTRACTION W/PHACO Left 03/21/2015   Procedure: CATARACT EXTRACTION PHACO AND INTRAOCULAR LENS PLACEMENT (IOC);  Surgeon: Estill Cotta, MD;  Location: [facility] ORS;  Service: Ophthalmology;  Laterality: Left;  [name] 01:21 AP% 25.6 CDE 39.78 fluid pack lot # [id number]  . HAND SURGERY Right   . INTERSTIM IMPLANT REMOVAL N/A 09/07/2015   Procedure: REMOVAL OF INTERSTIM IMPLANT;  Surgeon: Bjorn Loser, MD;  Location: [facility] ORS;  Service: Urology;  Laterality: N/A;  . OOPHORECTOMY Right    ovarian cyst     Home Meds: Prior to Admission medications   Medication Sig Start Date End Date Taking? Authorizing Provider  acetaminophen (TYLENOL) 500 MG tablet Take 500 mg by mouth every 6 (six) hours as needed for moderate pain.    Yes [provider]  albuterol (PROVENTIL HFA;VENTOLIN HFA) 108 (90 Base) MCG/ACT inhaler Inhale 2 puffs into the lungs every 6 (six) hours as needed for wheezing or shortness of breath. 11/28/17  Yes Mikey College, NP  budesonide-formoterol [facility]) 160-4.5 MCG/ACT inhaler Inhale 2 puffs into the lungs 2 (two) times daily. 09/27/17  Yes Mikey College, NP  busPIRone (BUSPAR) 7.5 MG tablet Take 1 tablet (7.5 mg total) by mouth 3 (three) times daily. 05/14/17  Yes Mikey College, NP  citalopram (CELEXA) 40 MG tablet Take 1 tablet (40 mg total) by mouth daily. 11/08/17  Yes Mikey College, NP  clonazePAM (KLONOPIN) 1 MG tablet Take 1 tablet (1MG ) by mouth every morning, 1/2 tablet (0.5MG ) every afternoon and 1 tablet (1MG ) by mouth every night 10/08/17  Yes Mikey College, NP  clotrimazole-betamethasone (LOTRISONE) cream Apply 1 application topically 2 (two) times daily. 11/08/16  Yes Gae Dry, MD  diltiazem (CARDIZEM CD) 120 MG 24 hr capsule Take 1 capsule (120 mg total) by mouth daily. 07/24/17  Yes Mikey College, NP   DOK 100 MG capsule Take 100 mg by mouth 2 (two) times daily. 11/29/17  Yes [provider]  gabapentin (NEURONTIN) 300 MG capsule Take 1 capsule (300MG ) by mouth every morning and 2 capsules (600MG ) by mouth every night 11/19/17  Yes Mikey College, NP  levothyroxine (SYNTHROID, LEVOTHROID) 125 MCG tablet Take 1 tablet (125 mcg total) by mouth daily with breakfast. 10/09/17  Yes Mikey College, NP  lisinopril (PRINIVIL,ZESTRIL) 10 MG tablet Take 1 tablet (10 mg total) by mouth daily. 09/24/17  Yes Mikey College, NP  Misc. Devices (QUAD CANE/SMALL BASE) MISC 1 Device by Does not apply route daily. 06/26/17  Yes Mikey College, NP  oxyCODONE (OXY IR/ROXICODONE) 5 MG immediate release tablet Take 5-10 mg by mouth every 4 (four) hours as needed for pain. Take 5-10 mg by mouth every 4-6 hours as needed for pain. 11/29/17  Yes [provider]  Secukinumab (COSENTYX) 150 MG/ML SOSY Inject 150 mg into the skin every 14 (fourteen) days. 2 injections per month   Yes [provider]  triamcinolone (KENALOG) 0.025 % ointment Apply 1 application topically as directed.  02/08/16  Yes [provider]    Inpatient Medications:  . budesonide (PULMICORT) nebulizer solution  0.25 mg Nebulization Q12H  . busPIRone  7.5 mg Oral TID  . citalopram  40 mg Oral Daily  . dextromethorphan  60 mg Oral BID   And  . guaiFENesin  1,200 mg Oral BID  . diltiazem  120 mg Oral Daily  . docusate sodium  100 mg Oral BID  . heparin  5,000 Units Subcutaneous Q8H  . ipratropium-albuterol  3 mL Nebulization TID  . [START ON 12/03/2017] levothyroxine  100 mcg Oral Q breakfast  . lisinopril  20 mg Oral Daily  . methylPREDNISolone (SOLU-MEDROL) injection  60 mg Intravenous Daily     Allergies:  Allergies  Allergen Reactions  . Ciprofloxacin Hives and Diarrhea  . Oxycodone Diarrhea  . Oxycodone-Acetaminophen Diarrhea  . Oxycodone-Acetaminophen Diarrhea  . Sulfa  Antibiotics Rash    Throat, mouth Throat, mouth    Social History   Socioeconomic History  . Marital status: Widowed    Spouse name: Not on file  . Number of children: Not on file  . Years of education: Not on file  . Highest education level: Not on file  Occupational History  . Occupation: retired  Scientific laboratory technician  . Financial resource strain: Not hard at all  . Food insecurity:    Worry: Never true    Inability: Never true  . Transportation needs:    Medical: Yes    Non-medical: Yes  Tobacco Use  . Smoking status: Current Every Day Smoker    Packs/day: 1.00    Years: 35.00    Pack years: 35.00    Types: Cigarettes  . Smokeless tobacco: Never Used  . Tobacco comment: Previously smoked 2 ppd  Substance and Sexual Activity  . Alcohol use: No  . Drug use: No  . Sexual activity: Never  Birth control/protection: Surgical  Lifestyle  . Physical activity:    Days per week: 0 days    Minutes per session: 0 min  . Stress: Not at all  Relationships  . Social connections:    Talks on phone: More than three times a week    Gets together: More than three times a week    Attends religious service: More than 4 times per year    Active member of club or organization: No    Attends meetings of clubs or organizations: Never    Relationship status: Widowed  . Intimate partner violence:    Fear of current or ex partner: No    Emotionally abused: No    Physically abused: No    Forced sexual activity: No  Other Topics Concern  . Not on file  Social History Narrative  . Not on file     Family History  Problem Relation Age of Onset  . Heart disease Mother   . Breast cancer Mother   . Heart attack Mother   . COPD Father   . Stroke Paternal Uncle   . Kidney disease Sister   . Breast cancer Cousin   . Breast cancer Cousin   . Heart attack Maternal Aunt   . Stroke Paternal Grandfather   . Kidney cancer Neg Hx   . Bladder Cancer Neg Hx      Review of Systems: A  12-system review of systems was performed and is negative except as noted in the HPI.  Labs: Recent Labs    11/30/17 1945 12/01/17 0916 12/02/17 0320 12/02/17 1733  TROPONINI 0.28* 0.32* 0.33* 0.28*   Lab Results  Component Value Date   WBC 8.7 12/02/2017   HGB 11.7 (L) 12/02/2017   HCT 37.6 12/02/2017   MCV 96.2 12/02/2017   PLT 214 12/02/2017    Recent Labs  Lab 12/01/17 0150  12/02/17 0320  NA 136   < > 139  K 4.8   < > 4.3  CL 99   < > 101  CO2 32   < > 32  BUN 32*   < > 26*  CREATININE 1.32*   < > 0.77  CALCIUM 8.5*   < > 9.2  PROT 6.1*  --   --   BILITOT 0.5  --   --   ALKPHOS 61  --   --   ALT 17  --   --   AST 29  --   --   GLUCOSE 137*   < > 159*   < > = values in this interval not displayed.   Lab Results  Component Value Date   CHOL 205 (H) 02/03/2015   HDL 36 (L) 02/03/2015   LDLCALC 123 (H) 02/03/2015   TRIG 232 (H) 02/03/2015   No results found for: DDIMER  Radiology/Studies:  Dg Chest 1 View  Result Date: 11/30/2017 CLINICAL DATA:  Per EMS report, patient had back surgery two days ago and was sent home with Oxycodone 5mg . Patient's friend found her altered today and called EMS. Patient was unable to state how many Oxycodone she took today.pt low BP and O2 stats in room EXAM: CHEST  1 VIEW COMPARISON:  11/20/2017 FINDINGS: Cardiac silhouette is normal in size. No mediastinal or hilar masses. No convincing adenopathy. Lung volumes relatively low. Prominent bronchovascular markings most evident bases. Mild additional opacity at the bases likely due to subsegmental atelectasis. Lungs otherwise clear. No convincing pleural effusion. No pneumothorax. Skeletal structures are  demineralized but grossly intact. IMPRESSION: No acute cardiopulmonary disease. Electronically Signed   By: Lajean Manes M.D.   On: 11/30/2017 19:58   Dg Chest 2 View  Result Date: 11/20/2017 CLINICAL DATA:  Follow-up pneumonia.  COPD. EXAM: CHEST - 2 VIEW COMPARISON:  10/28/2017  chest radiograph. FINDINGS: Stable cardiomediastinal silhouette with normal heart size. No pneumothorax. No pleural effusion. Hyperinflated lungs. No pulmonary edema. Mild curvilinear opacity at posterior lung bases appears slightly less prominent, favor mild scarring or atelectasis. No acute consolidative airspace disease. IMPRESSION: 1. Hyperinflated lungs, compatible with the provided history of COPD. 2. Mild curvilinear opacity at the posterior lung bases, slightly less prominent, favor mild scarring or atelectasis. Electronically Signed   By: Ilona Sorrel M.D.   On: 11/20/2017 17:23   Ct Head Wo Contrast  Result Date: 12/01/2017 CLINICAL DATA:  67 year old female with altered mental status. EXAM: CT HEAD WITHOUT CONTRAST TECHNIQUE: Contiguous axial images were obtained from the base of the skull through the vertex without intravenous contrast. COMPARISON:  Head CT dated 02/02/2013 FINDINGS: Brain: The ventricles and sulci appropriate size for patient's age.5 minimal periventricular and deep white matter chronic microvascular ischemic changes noted. There is no acute intracranial hemorrhage. No mass effect or midline shift. No extra-axial fluid collection. Vascular: No hyperdense vessel or unexpected calcification. Skull: Normal. Negative for fracture or focal lesion. Sinuses/Orbits: Chronic right maxillary sinus disease. The remainder of the visualized paranasal sinuses and mastoid air cells are clear. Other: None IMPRESSION: 1. No acute intracranial pathology. 2. Chronic right maxillary sinus disease. Electronically Signed   By: Anner Crete M.D.   On: 12/01/2017 05:00   Ct Lumbar Spine Wo Contrast  Result Date: 12/01/2017 CLINICAL DATA:  67 y/o F; back surgery 11/29/2017. Acute change in mental status. EXAM: CT LUMBAR SPINE WITHOUT CONTRAST TECHNIQUE: Multidetector CT imaging of the lumbar spine was performed without intravenous contrast administration. Multiplanar CT image reconstructions were  also generated. COMPARISON:  08/19/2006 lumbar spine MRI FINDINGS: Segmentation: 5 lumbar type vertebrae. Alignment: Normal. Vertebrae: L4 and L5 vertebral body laminectomy postsurgical changes. No acute fracture, bony erosive changes, or suspicious osseous lesion. Paraspinal and other soft tissues: There is edema with several foci of air within the subcutaneous fat, paraspinal muscles, and laminectomy bed at the L4 and L5 levels. Additionally there are a few punctate foci of air within the dorsal epidural space at L3 and L4 levels. Abdominal aortic calcific atherosclerosis. Partially visualized dependent atelectasis of the lungs. Disc levels: Mild multilevel loss of intervertebral disc space height T12 through L4 and L5-S1 with vacuum phenomenon. Small multilevel endplate marginal osteophytes. Moderate facet arthrosis at L3 through S1. Disc and facet degenerative changes result in mild bony foraminal stenosis at the L3 through S1 levels. No high-grade bony canal stenosis. IMPRESSION: 1. L4 and L5 vertebral body laminectomy postsurgical changes. No acute osseous abnormality identified. 2. Postsurgical changes within the L4-5 posterior soft tissues and laminectomy bed. Infection not excluded, clinical correlation recommended. Electronically Signed   By: Kristine Garbe M.D.   On: 12/01/2017 04:54   Dg Chest Port 1 View  Result Date: 12/02/2017 CLINICAL DATA:  Follow-up pneumonia EXAM: PORTABLE CHEST 1 VIEW COMPARISON:  11/30/2017 FINDINGS: Cardiac shadow is within normal limits. Aortic calcifications are again seen. The lungs are well aerated bilaterally. No focal infiltrate or sizable effusion is seen. No acute bony abnormality is noted. IMPRESSION: No acute abnormality noted. Electronically Signed   By: Inez Catalina M.D.   On: 12/02/2017 07:35  Wt Readings from Last 3 Encounters:  12/02/17 82 kg  11/04/17 78.3 kg  10/28/17 77.8 kg    EKG: afib with rr.  Physical Exam:  Blood pressure (!)  142/88, pulse 92, temperature 98.7 F (37.1 C), temperature source Oral, resp. rate 20, height 5\' 2"  (1.575 m), weight 82 kg, SpO2 94 %. Body mass index is 33.05 kg/m. General: Well developed, well nourished, in no acute distress. Head: Normocephalic, atraumatic, sclera non-icteric, no xanthomas, nares are without discharge.  Neck: Negative for carotid bruits. JVD not elevated. Lungs: Reduced breath sounds. No wheezes or rhonchi Heart: RRR with S1 S2. No murmurs, rubs, or gallops appreciated. Abdomen: Soft, non-tender, non-distended with normoactive bowel sounds. No hepatomegaly. No rebound/guarding. No obvious abdominal masses. Msk:  Strength and tone appear normal for age. Extremities: No clubbing or cyanosis. No edema.  Distal pedal pulses are 2+ and equal bilaterally. Neuro: Alert and oriented X 3. No facial asymmetry. No focal deficit. Moves all extremities spontaneously. Psych:  Responds to questions appropriately with a normal affect.     Assessment and Plan  67 yo female with history of copd and recent back surgery admitted with altered mental status initially felt to be secondary to accidental narcotic over dose. Hypercapnea did not resolve with narcan. She was on bipap initally and has been changed to nasal canula. She develped recurrent afib with rvr after transferring to the floor from the icu during a coughing episode. She had missed several doses of her cardizem whih she takes as an outpatient. She ahs converted back to nsr with resumption of her cardizem and a dose of iv metoprolol. Mild troponin elevation.  Afib-induced by respiratory stress. Will conitnue with po cardizem at 120 mg daily and increase as needed with iv bolus for breakthrough. Would defer chronic anticoagulation for now due to bleeding risk. CHADS2VASc score of at least 3. Moderate bleeding risk.   Elevated troponin-appears to be demand ischemia. Defer cath for now.  COPD-conitnue with  bronchodilators.  HTN-conitnue with lisinopril, cardizem with prn hydralazine for now.   Signed, Teodoro Spray MD 12/02/2017, 7:59 PM Pager: [phone]

## 2017-12-02 NOTE — Progress Notes (Signed)
Elevated BP. PRN Hydraziine IV given at 1420. Patient's home BP meds restarted at 1530 this shift.  About 1625, Tele notified this RN  that patient's HR in the 200's. Went into room to find patient A&O x3 (same as pervious), Radial pulse was 201, BP 125/95. RRT called. Placed 2L O2 on patient. Sats were 95% on RA. Stat EKG ordered. MD notified. IV metoprolol 10mg  IV x1, given at bedside. New orders to transfer patient to 2A. No drip ordered at this time. Transferred patient with Gean Maidens RN, bedside report given. Tele boxes exchanged.  Son at bedside and updated.

## 2017-12-02 NOTE — Significant Event (Signed)
Rapid Response Event Note  Overview: Time Called: 3810 Arrival Time: 0435 Event Type: Cardiac  Initial Focused Assessment: Patient in NAD. Cardiac monitoring showing HR 200's. Patient denies chest pain or SHOB. BP stable.  Interventions: Vaccahni paged and made aware, MD on way to bedside This RN per standing orders, EKG EKG resulted in A. Fib with RVR Vachhani to bedside, assessed, orders to transfer to 2A, cardiazem drip ordered and started by SWOT RN  Plan of Care (if not transferred): Patient transferred to 2A  Event Summary: Name of Physician Notified: Vachhani at 1630    at    Outcome: Transferred (Comment)(transferred to 2A)  Event End Time: [place]

## 2017-12-02 NOTE — Care Management Note (Signed)
Case Management Note  Patient Details  Name: [name] MRN: [id number] Date of Birth: 07/30/1950  Subjective/Objective:  Met with son outside of room to discuss discharge planning. Patient in the room, coughing and sounds very short of breath. Son has questions regarding SNF vs ALF. RNCM discussed the difference. Son unable to pay out of pocket for either. He states patient makes $1800 per month income. She normally lives alone and uses a rolator. Will need a bedside commode at discharge. Offered a list of private duty agencies and son declined since he states he cant afford them. He prefers [facility] for home health. Will need RN, PT, OT, HHA, SW. Referral to [facility] with [facility]. Will follow progression.                    Action/Plan:   Expected Discharge Date:  12/03/17               Expected Discharge Plan:     In-House Referral:     Discharge planning Services  CM Consult  Post Acute Care Choice:  Durable Medical Equipment, Home Health Choice offered to:  Adult Children  DME Arranged:  Bedside commode DME Agency:  [facility]  HH Arranged:  RN, PT, OT, Nurse's Aide, Social Work HH Agency:  [facility]  Status of Service:  In process, will continue to follow  If discussed at Long Length of Stay Meetings, dates discussed:    Additional Comments:  Lisa M Jacobs, RN 12/02/2017, 4:38 PM  

## 2017-12-02 NOTE — Progress Notes (Signed)
   12/02/17 1700  Clinical Encounter Type  Visited With Patient;Family  Visit Type Initial (Rapid Response)  Referral From Nurse  Recommendations Follow-up, if requested.  Spiritual Encounters  Spiritual Needs Emotional;Prayer  Stress Factors  Patient Stress Factors Health changes   Chaplain responded to Rapid Response and encountered the patient, care team, and son Everette. Chaplain offered presence and prayer during the response and spent time discussing the patient's situation with her son. Everette says that he will be the HCPOA and not his younger brother. Chaplain recommended the completion of AD to formalize both HCPOA and LW. Everette will have the on-call chaplain paged when the patient and family are ready.

## 2017-12-02 NOTE — Progress Notes (Signed)
*  PRELIMINARY RESULTS* Echocardiogram 2D Echocardiogram has been performed.  [name] 12/02/2017, 9:59 AM

## 2017-12-02 NOTE — Progress Notes (Addendum)
Patient was seen again around 4 PM due to rapid response called by nurses for tachycardia.  Patient was doing fine up until the afternoon.  She had a coughing spell which started her tachycardia and rapid rhythm.  I went and seen the patient again.  She appears slightly distressed but denied any chest pain.  She was more anxious because of presence of multiple nurses and doctors in the room.  Review of system She did not had feeling of palpitation.  She did not had any associated shortness of breath. No chest pain.  Physical exam Patient is alert and oriented to time place and person.  Her respiratory efforts are normal and she does not have wheezing or crackling. She has rapid and irregular rhythm with palpable pulses. She appears slightly anxious. She is able to move all 4 limbs and follow commands.  Vitals Blood pressure was stable, heart rate was noted running anywhere from 190-210 Oxygen saturation was stable.  EKG done and reviewed which showed atrial fibrillation with rapid ventricular response.  Assessment and plan  *Atrial fibrillation with rapid ventricular response Patient had missed her Cardizem oral tablets for last 2 days which I had started and she just received 1 hour ago. I have given 1 injection metoprolol 10 mg one-time dose That helped to convert to normal sinus rhythm with rate from 80 to 90/min We will follow serial troponin and transfer her to telemetry unit. I spoke to her cardiologist Dr. fat for further management.  *Hypothyroidism Her TSH is low as checked yesterday. I will decrease the dose of levothyroxine.  Discussed with floor nurses and cardiologist and patient's son in detail who was present during this event.  Critical care time spent 40 minutes.

## 2017-12-02 NOTE — Progress Notes (Signed)
[facility] NAME: [name]    MR#:  [id number]  DATE OF BIRTH:  [birth year]  SUBJECTIVE:  CHIEF COMPLAINT:   Chief Complaint  Patient presents with  . Drug Overdose   Recent back surgery, came with altered mental status and CO2 retention with respiratory distress.  Noted to have oxycodone overdose.  He initially required BiPAP in stepdown unit, improved now and transferred to floor. Seen in the morning, patient walked around the nursing station without any supplemental oxygen or distress.  Was feeling very good and expected to go home.  REVIEW OF SYSTEMS:  CONSTITUTIONAL: No fever, have fatigue or weakness.  EYES: No blurred or double vision.  EARS, NOSE, AND THROAT: No tinnitus or ear pain.  RESPIRATORY: No cough, shortness of breath, wheezing or hemoptysis.  CARDIOVASCULAR: No chest pain, orthopnea, edema.  GASTROINTESTINAL: No nausea, vomiting, diarrhea or abdominal pain.  GENITOURINARY: No dysuria, hematuria.  ENDOCRINE: No polyuria, nocturia,  HEMATOLOGY: No anemia, easy bruising or bleeding SKIN: No rash or lesion. MUSCULOSKELETAL: No joint pain or arthritis.   NEUROLOGIC: No tingling, numbness, weakness.  PSYCHIATRY: No anxiety or depression.   ROS  DRUG ALLERGIES:   Allergies  Allergen Reactions  . Ciprofloxacin Hives and Diarrhea  . Oxycodone Diarrhea  . Oxycodone-Acetaminophen Diarrhea  . Oxycodone-Acetaminophen Diarrhea  . Sulfa Antibiotics Rash    Throat, mouth Throat, mouth    VITALS:  Blood pressure (!) 142/88, pulse 92, temperature 98.7 F (37.1 C), temperature source Oral, resp. rate 20, height 5\' 2"  (1.575 m), weight 82 kg, SpO2 92 %.  PHYSICAL EXAMINATION:  GENERAL:  67 y.o.-year-old patient lying in the bed with no acute distress.  EYES: Pupils equal, round, reactive to light and accommodation. No scleral icterus. Extraocular muscles intact.  HEENT: Head atraumatic, normocephalic. Oropharynx and  nasopharynx clear.  NECK:  Supple, no jugular venous distention. No thyroid enlargement, no tenderness.  LUNGS: Normal breath sounds bilaterally, no wheezing, rales,rhonchi or crepitation. No use of accessory muscles of respiration.  CARDIOVASCULAR: S1, S2 normal. No murmurs, rubs, or gallops.  ABDOMEN: Soft, nontender, nondistended. Bowel sounds present. No organomegaly or mass.  Lower back tenderness. EXTREMITIES: No pedal edema, cyanosis, or clubbing.  NEUROLOGIC: Cranial nerves II through XII are intact. Muscle strength 4/5 in all extremities. Sensation intact. Gait not checked.  PSYCHIATRIC: The patient is alert and oriented x 3.  SKIN: No obvious rash, lesion, or ulcer.   Physical Exam LABORATORY PANEL:   CBC Recent Labs  Lab 12/02/17 0320  WBC 8.7  HGB 11.7*  HCT 37.6  PLT 214   ------------------------------------------------------------------------------------------------------------------  Chemistries  Recent Labs  Lab 12/01/17 0150  12/02/17 0320  NA 136   < > 139  K 4.8   < > 4.3  CL 99   < > 101  CO2 32   < > 32  GLUCOSE 137*   < > 159*  BUN 32*   < > 26*  CREATININE 1.32*   < > 0.77  CALCIUM 8.5*   < > 9.2  MG 2.3  --   --   AST 29  --   --   ALT 17  --   --   ALKPHOS 61  --   --   BILITOT 0.5  --   --    < > = values in this interval not displayed.   ------------------------------------------------------------------------------------------------------------------  Cardiac Enzymes Recent Labs  Lab 12/02/17 0320 12/02/17 1733  TROPONINI  0.33* 0.28*   ------------------------------------------------------------------------------------------------------------------  RADIOLOGY:  Dg Chest 1 View  Result Date: 11/30/2017 CLINICAL DATA:  Per EMS report, patient had back surgery two days ago and was sent home with Oxycodone 5mg . Patient's friend found her altered today and called EMS. Patient was unable to state how many Oxycodone she took today.pt low  BP and O2 stats in room EXAM: CHEST  1 VIEW COMPARISON:  11/20/2017 FINDINGS: Cardiac silhouette is normal in size. No mediastinal or hilar masses. No convincing adenopathy. Lung volumes relatively low. Prominent bronchovascular markings most evident bases. Mild additional opacity at the bases likely due to subsegmental atelectasis. Lungs otherwise clear. No convincing pleural effusion. No pneumothorax. Skeletal structures are demineralized but grossly intact. IMPRESSION: No acute cardiopulmonary disease. Electronically Signed   By: Lajean Manes M.D.   On: 11/30/2017 19:58   Ct Head Wo Contrast  Result Date: 12/01/2017 CLINICAL DATA:  67 year old female with altered mental status. EXAM: CT HEAD WITHOUT CONTRAST TECHNIQUE: Contiguous axial images were obtained from the base of the skull through the vertex without intravenous contrast. COMPARISON:  Head CT dated 02/02/2013 FINDINGS: Brain: The ventricles and sulci appropriate size for patient's age.5 minimal periventricular and deep white matter chronic microvascular ischemic changes noted. There is no acute intracranial hemorrhage. No mass effect or midline shift. No extra-axial fluid collection. Vascular: No hyperdense vessel or unexpected calcification. Skull: Normal. Negative for fracture or focal lesion. Sinuses/Orbits: Chronic right maxillary sinus disease. The remainder of the visualized paranasal sinuses and mastoid air cells are clear. Other: None IMPRESSION: 1. No acute intracranial pathology. 2. Chronic right maxillary sinus disease. Electronically Signed   By: Anner Crete M.D.   On: 12/01/2017 05:00   Ct Lumbar Spine Wo Contrast  Result Date: 12/01/2017 CLINICAL DATA:  67 y/o F; back surgery 11/29/2017. Acute change in mental status. EXAM: CT LUMBAR SPINE WITHOUT CONTRAST TECHNIQUE: Multidetector CT imaging of the lumbar spine was performed without intravenous contrast administration. Multiplanar CT image reconstructions were also  generated. COMPARISON:  08/19/2006 lumbar spine MRI FINDINGS: Segmentation: 5 lumbar type vertebrae. Alignment: Normal. Vertebrae: L4 and L5 vertebral body laminectomy postsurgical changes. No acute fracture, bony erosive changes, or suspicious osseous lesion. Paraspinal and other soft tissues: There is edema with several foci of air within the subcutaneous fat, paraspinal muscles, and laminectomy bed at the L4 and L5 levels. Additionally there are a few punctate foci of air within the dorsal epidural space at L3 and L4 levels. Abdominal aortic calcific atherosclerosis. Partially visualized dependent atelectasis of the lungs. Disc levels: Mild multilevel loss of intervertebral disc space height T12 through L4 and L5-S1 with vacuum phenomenon. Small multilevel endplate marginal osteophytes. Moderate facet arthrosis at L3 through S1. Disc and facet degenerative changes result in mild bony foraminal stenosis at the L3 through S1 levels. No high-grade bony canal stenosis. IMPRESSION: 1. L4 and L5 vertebral body laminectomy postsurgical changes. No acute osseous abnormality identified. 2. Postsurgical changes within the L4-5 posterior soft tissues and laminectomy bed. Infection not excluded, clinical correlation recommended. Electronically Signed   By: Kristine Garbe M.D.   On: 12/01/2017 04:54   Dg Chest Port 1 View  Result Date: 12/02/2017 CLINICAL DATA:  Follow-up pneumonia EXAM: PORTABLE CHEST 1 VIEW COMPARISON:  11/30/2017 FINDINGS: Cardiac shadow is within normal limits. Aortic calcifications are again seen. The lungs are well aerated bilaterally. No focal infiltrate or sizable effusion is seen. No acute bony abnormality is noted. IMPRESSION: No acute abnormality noted. Electronically Signed   By:  Inez Catalina M.D.   On: 12/02/2017 07:35    ASSESSMENT AND PLAN:   Active Problems:   Acute respiratory failure ([place])  1.  Acute respiratory failure with hypoxia and and hypercapnia.  This is likely  multifactorial, related to respiratory depression due to side effects from pain medications and other sedatives,  ARF, COPD and possibly obstructive sleep apnea.   Initially required treatment with BiPAP, oxygen and avoid use of sedative meds.  Patient would likely need sleep studies as outpatient, after discharge. Respiratory status improved now on nasal cannula oxygen,  tapered back to room air.  2.  Acute renal failure, likely prerenal.    gentle IV hydration and monitor kidney function closely.  Avoid nephrotoxic medications.  Back to normal now. 3.  Acute encephalopathy, related to acute respiratory failure and acute renal failure.  We will continue to monitor clinically closely while treating the underlying disease. Improved, fully alert and oriented now. It was due to using multiple meds in combination of gabapentin, Klonopin and oxycodone. Will stop all of them except oxycodone as she had recent surgery and still pain. 4.  Elevated troponin level, at 0.28.  This is likely due to demand ischemia, from acute respiratory failure.  We will continue to monitor patient on telemetry and follow troponin levels to rule out ACS.  check 2D echo to further evaluate cardiac function. Troponin remains stable on further follow-up.  To follow with primary care physician. 5.  Tobacco abuse.  Smoking cessation will be discussed with patient in detail. 6.  COPD with acute exacerbation.  We will treat with IV steroids, nebulizers, oxygen and BiPAP. 7.  Hypertension, stable, will resume home medications. 8.  Status post kyphoplasty 2 days ago.    Patient was able to walk around nursing station with physical therapy and a walker.  All the records are reviewed and case discussed with Care Management/Social Workerr. Management plans discussed with the patient, family and they are in agreement.  CODE STATUS: Full.  TOTAL TIME TAKING CARE OF THIS PATIENT: 35 minutes.    POSSIBLE D/C IN 1-2 DAYS, DEPENDING ON  CLINICAL CONDITION.   Vaughan Basta M.D on 12/02/2017   Between 7am to 6pm - Pager - [phone]  After 6pm go to [email or web address] - password [email or web address]  Office  [phone]  CC: Primary care physician; Mikey College, NP  Note: This dictation was prepared with Dragon dictation along with smaller phrase technology. Any transcriptional errors that result from this process are unintentional.

## 2017-12-03 LAB — GLUCOSE, CAPILLARY: GLUCOSE-CAPILLARY: 120 mg/dL — AB (ref 70–99)

## 2017-12-03 LAB — BASIC METABOLIC PANEL
Anion gap: 9 (ref 5–15)
BUN: 36 mg/dL — AB (ref 8–23)
CALCIUM: 8.9 mg/dL (ref 8.9–10.3)
CHLORIDE: 99 mmol/L (ref 98–111)
CO2: 33 mmol/L — ABNORMAL HIGH (ref 22–32)
CREATININE: 0.94 mg/dL (ref 0.44–1.00)
GFR calc Af Amer: >60 mL/min (ref >60)
GFR calc non Af Amer: >60 mL/min (ref >60)
Glucose, Bld: 160 mg/dL — ABNORMAL HIGH (ref 70–99)
Potassium: 4.4 mmol/L (ref 3.5–5.1)
SODIUM: 141 mmol/L (ref 135–145)

## 2017-12-03 LAB — TROPONIN I: Troponin I: 0.26 ng/mL (ref <0.03)

## 2017-12-03 LAB — HIV ANTIBODY (ROUTINE TESTING W REFLEX): HIV SCREEN 4TH GENERATION: NONREACTIVE

## 2017-12-03 LAB — PROCALCITONIN

## 2017-12-03 LAB — MAGNESIUM: MAGNESIUM: 2.3 mg/dL (ref 1.7–2.4)

## 2017-12-03 MED ORDER — LEVOTHYROXINE SODIUM 100 MCG PO TABS: 100.0000 ug | ORAL_TABLET | Freq: Every day | ORAL | 0 refills | Status: DC

## 2017-12-03 MED ORDER — CARVEDILOL 3.125 MG PO TABS: 3.1250 mg | ORAL_TABLET | Freq: Two times a day (BID) | ORAL | 0 refills | Status: DC

## 2017-12-03 MED ORDER — CARVEDILOL 3.125 MG PO TABS
3.1250 mg | ORAL_TABLET | Freq: Two times a day (BID) | ORAL | Status: DC
  Administered 2017-12-03 (×2): 3.125 mg via ORAL
  Filled 2017-12-03 (×2): qty 1

## 2017-12-03 MED ORDER — PREDNISONE 10 MG (21) PO TBPK: ORAL_TABLET | ORAL | 0 refills | Status: DC

## 2017-12-03 NOTE — Progress Notes (Signed)
Progress Note  Patient Name: [name] Date of Encounter: 12/03/2017  Primary Cardiologist: Dr. Nehemiah Massed  Subjective   Feels better this morning.  No further tachyarrhythmias noted on telemetry.  Inpatient Medications    Scheduled Meds: . budesonide (PULMICORT) nebulizer solution  0.25 mg Nebulization Q12H  . busPIRone  7.5 mg Oral TID  . carvedilol  3.125 mg Oral BID WC  . citalopram  40 mg Oral Daily  . dextromethorphan  60 mg Oral BID   And  . guaiFENesin  1,200 mg Oral BID  . diltiazem  120 mg Oral Daily  . docusate sodium  100 mg Oral BID  . heparin  5,000 Units Subcutaneous Q8H  . ipratropium-albuterol  3 mL Nebulization TID  . levothyroxine  100 mcg Oral Q breakfast  . lisinopril  20 mg Oral Daily  . methylPREDNISolone (SOLU-MEDROL) injection  60 mg Intravenous Daily   Continuous Infusions:  PRN Meds: bisacodyl, hydrALAZINE, ipratropium-albuterol, ondansetron **OR** ondansetron (ZOFRAN) IV, oxyCODONE-acetaminophen, traZODone   Vital Signs    Vitals:   12/03/17 0500 12/03/17 0739 12/03/17 0751 12/03/17 0757  BP:    (!) 142/81  Pulse:    76  Resp:    19  Temp:    98 F (36.7 C)  TempSrc:    Oral  SpO2:  98% 100% 95%  Weight: 80.6 kg     Height:        Intake/Output Summary (Last 24 hours) at 12/03/2017 0821 Last data filed at 12/02/2017 1757 Gross per 24 hour  Intake -  Output 250 ml  Net -250 ml   Filed Weights   12/02/17 0500 12/02/17 1719 12/03/17 0500  Weight: 86.2 kg 82 kg 80.6 kg    Telemetry    Atrial fibrillation with rapid ventricular response last p.m. however has been in sinus rhythm the last 12 hours- Personally Reviewed  ECG    Normal sinus rhythm- Personally Reviewed  Physical Exam    GEN: No acute distress.   Neck: No JVD Cardiac: RRR, no murmurs, rubs, or gallops.  Respiratory: Clear to auscultation bilaterally. GI: Soft, nontender, non-distended  MS: No edema; No deformity. Neuro:  Nonfocal  Psych: Normal affect    Labs    Chemistry Recent Labs  Lab 11/30/17 1945 12/01/17 0150 12/01/17 0916 12/02/17 0320  NA 134* 136 138 139  K 5.3* 4.8 5.2* 4.3  CL 97* 99 102 101  CO2 29 32 30 32  GLUCOSE 190* 137* 169* 159*  BUN 31* 32* 27* 26*  CREATININE 1.62* 1.32* 1.15* 0.77  CALCIUM 8.7* 8.5* 8.5* 9.2  PROT 6.3* 6.1*  --   --   ALBUMIN 3.5 3.3*  --   --   AST 36 29  --   --   ALT 16 17  --   --   ALKPHOS 64 61  --   --   BILITOT 1.3* 0.5  --   --   GFRNONAA 32* 40* 48* >60  GFRAA 37* 47* 55* >60  ANIONGAP 8 5 6 6      Hematology Recent Labs  Lab 11/30/17 1945 12/01/17 0150 12/02/17 0320  WBC 14.2* 11.5* 8.7  RBC 4.16 3.96 3.91  HGB 12.3 11.8* 11.7*  HCT 42.1 39.2 37.6  MCV 101.2* 99.0 96.2  MCH 29.6 29.8 29.9  MCHC 29.2* 30.1 31.1  RDW 14.1 13.8 13.3  PLT 221 198 214    Cardiac Enzymes Recent Labs  Lab 12/02/17 0320 12/02/17 1733 12/02/17 2143 12/03/17 0042  TROPONINI 0.33* 0.28* 0.27* 0.26*   No results for input(s): TROPIPOC in the last 168 hours.   BNPNo results for input(s): BNP, PROBNP in the last 168 hours.   DDimer No results for input(s): DDIMER in the last 168 hours.   Radiology    Dg Chest Port 1 View  Result Date: 12/02/2017 CLINICAL DATA:  Follow-up pneumonia EXAM: PORTABLE CHEST 1 VIEW COMPARISON:  11/30/2017 FINDINGS: Cardiac shadow is within normal limits. Aortic calcifications are again seen. The lungs are well aerated bilaterally. No focal infiltrate or sizable effusion is seen. No acute bony abnormality is noted. IMPRESSION: No acute abnormality noted. Electronically Signed   By: Inez Catalina M.D.   On: 12/02/2017 07:35    Cardiac Studies   Echocardiogram-function EF 65-70.  Mild MR and TR.  Patient Profile     67 y.o. female admitted with altered mental status and noted to have atrial fibrillation with rapid ventricular response.  Currently in sinus rhythm since being placed back on diltiazem.  We will continue with diltiazem CD at 120 mg  daily.  Would ambulate and follow.  Consider discharge from a cardiac standpoint if rhythm remains stable.  Assessment & Plan    Atrial fibrillation-likely stimulated by hypoxia, cough being off of her Cardizem.  Currently back in sinus rhythm since being placed back on her medications.  Continue with Cardizem and carvedilol at 3.125 mg twice daily.  Would defer chronic anticoagulation at present with that decision being made as an outpatient.  Would ambulate and if stable consider discharge from a cardiac standpoint.  Follow-up with Dr. Nehemiah Massed as an outpatient in 1 week.  Altered mental status-likely secondary to over medication for back pain.  Was hypercapnic.  This appears to have improved.  Hypertension-continue with lisinopril 20 mg daily as well as carvedilol and diltiazem.  Low-sodium diet.       Signed, Teodoro Spray, MD  12/03/2017, 8:21 AM

## 2017-12-03 NOTE — Care Management Note (Signed)
Case Management Note  Patient Details  Name: [name] MRN: [id number] Date of Birth: 04/02/1950  Subjective/Objective:    Patient discharging to home today with home health.  Recent back surgery.  Admitted with Acute resiratory failure; HX COPD.  Offered choice and she would like [place].  Referral made with Corene Cornea for DME, [facility] and home health RN, PT and aide, COPD protocol.  Patient is independent at home.  She has plenty of friends and support.  Her cousin drives her to appointments.  She is current with PCP and gets her medications at [facility].  Denies difficulties obtaining medication or with housing.  Made [facility] referral                Action/Plan:   Expected Discharge Date:  12/03/17               Expected Discharge Plan:  [place]  In-House Referral:     Discharge planning Services  CM Consult  Post Acute Care Choice:  Durable Medical Equipment, Home Health Choice offered to:  Adult Children  DME Arranged:  Bedside commode DME Agency:  [facility] Arranged:  RN, PT, OT, Nurse's Aide, Social Work, [employer] Agency:  [facility] [facility]  Status of Service:  Completed, signed off  If discussed at [employer], dates discussed:    Additional Comments:  Elza Rafter, RN 12/03/2017, 3:13 PM

## 2017-12-03 NOTE — Care Management Important Message (Signed)
Copy of signed IM left with patient in room.  

## 2017-12-03 NOTE — Plan of Care (Signed)
Pt. oxygen level decreased to 1L.  Stable sats are noted. Continue pain meds for previous back surgery.  No other c/o pain or shortness of breath noted.  Will continue to monitor.

## 2017-12-03 NOTE — Plan of Care (Signed)
  Problem: Education: Goal: Knowledge of General Education information will improve Description Including pain rating scale, medication(s)/side effects and non-pharmacologic comfort measures Outcome: Progressing   Problem: Activity: Goal: Risk for activity intolerance will decrease Outcome: Progressing   Problem: Clinical Measurements: Goal: Ability to maintain clinical measurements within normal limits will improve Outcome: Adequate for Discharge   Problem: Clinical Measurements: Goal: Cardiovascular complication will be avoided Outcome: Completed/Met

## 2017-12-03 NOTE — Progress Notes (Signed)
Patient discharged home today, medication regimen education provided, patient is stable and has no complaints. Follow up appointments provided and patient will have home health nurse and PT.

## 2017-12-05 ENCOUNTER — Other Ambulatory Visit: Payer: Self-pay

## 2017-12-05 DIAGNOSIS — F329 Major depressive disorder, single episode, unspecified: Secondary | ICD-10-CM | POA: Diagnosis not present

## 2017-12-05 DIAGNOSIS — F17210 Nicotine dependence, cigarettes, uncomplicated: Secondary | ICD-10-CM | POA: Diagnosis not present

## 2017-12-05 DIAGNOSIS — N189 Chronic kidney disease, unspecified: Secondary | ICD-10-CM | POA: Diagnosis not present

## 2017-12-05 DIAGNOSIS — Z9181 History of falling: Secondary | ICD-10-CM | POA: Diagnosis not present

## 2017-12-05 DIAGNOSIS — M1990 Unspecified osteoarthritis, unspecified site: Secondary | ICD-10-CM | POA: Diagnosis not present

## 2017-12-05 DIAGNOSIS — I129 Hypertensive chronic kidney disease with stage 1 through stage 4 chronic kidney disease, or unspecified chronic kidney disease: Secondary | ICD-10-CM | POA: Diagnosis not present

## 2017-12-05 DIAGNOSIS — I4891 Unspecified atrial fibrillation: Secondary | ICD-10-CM | POA: Diagnosis not present

## 2017-12-05 DIAGNOSIS — Z7952 Long term (current) use of systemic steroids: Secondary | ICD-10-CM | POA: Diagnosis not present

## 2017-12-05 DIAGNOSIS — Z8701 Personal history of pneumonia (recurrent): Secondary | ICD-10-CM | POA: Diagnosis not present

## 2017-12-05 DIAGNOSIS — E039 Hypothyroidism, unspecified: Secondary | ICD-10-CM | POA: Diagnosis not present

## 2017-12-05 DIAGNOSIS — G8929 Other chronic pain: Secondary | ICD-10-CM | POA: Diagnosis not present

## 2017-12-05 DIAGNOSIS — Z4789 Encounter for other orthopedic aftercare: Secondary | ICD-10-CM | POA: Diagnosis not present

## 2017-12-05 DIAGNOSIS — J441 Chronic obstructive pulmonary disease with (acute) exacerbation: Secondary | ICD-10-CM | POA: Diagnosis not present

## 2017-12-05 DIAGNOSIS — F419 Anxiety disorder, unspecified: Secondary | ICD-10-CM | POA: Diagnosis not present

## 2017-12-05 NOTE — Patient Outreach (Signed)
[facility]) Care Management  [place]  12/05/2017   [name] 1950-04-24 [id number]  Subjective: "I'm improving, getting better"  Objective: none-telephonic  Encounter Medications:  Outpatient Encounter Medications as of 12/05/2017  Medication Sig  . acetaminophen (TYLENOL) 500 MG tablet Take 500 mg by mouth every 6 (six) hours as needed for moderate pain.   Marland Kitchen albuterol (PROVENTIL HFA;VENTOLIN HFA) 108 (90 Base) MCG/ACT inhaler Inhale 2 puffs into the lungs every 6 (six) hours as needed for wheezing or shortness of breath.  . budesonide-formoterol (SYMBICORT) 160-4.5 MCG/ACT inhaler Inhale 2 puffs into the lungs 2 (two) times daily.  . busPIRone (BUSPAR) 7.5 MG tablet Take 1 tablet (7.5 mg total) by mouth 3 (three) times daily.  . carvedilol (COREG) 3.125 MG tablet Take 1 tablet (3.125 mg total) by mouth 2 (two) times daily with a meal.  . citalopram (CELEXA) 40 MG tablet Take 1 tablet (40 mg total) by mouth daily.  . clotrimazole-betamethasone (LOTRISONE) cream Apply 1 application topically 2 (two) times daily.  Marland Kitchen diltiazem (CARDIZEM CD) 120 MG 24 hr capsule Take 1 capsule (120 mg total) by mouth daily.  Marland Kitchen DOK 100 MG capsule Take 100 mg by mouth 2 (two) times daily.  Marland Kitchen levothyroxine (SYNTHROID, LEVOTHROID) 100 MCG tablet Take 1 tablet (100 mcg total) by mouth daily with breakfast.  . lisinopril (PRINIVIL,ZESTRIL) 10 MG tablet Take 1 tablet (10 mg total) by mouth daily.  Marland Kitchen oxyCODONE (OXY IR/ROXICODONE) 5 MG immediate release tablet Take 5-10 mg by mouth every 4 (four) hours as needed for pain. Take 5-10 mg by mouth every 4-6 hours as needed for pain.  . predniSONE (STERAPRED UNI-PAK 21 TAB) 10 MG (21) TBPK tablet Take 6 tabs first day, 5 tab on day 2, then 4 on day 3rd, 3 tabs on day 4th , 2 tab on day 5th, and 1 tab on 6th day.  . Secukinumab (COSENTYX) 150 MG/ML SOSY Inject 150 mg into the skin every 14 (fourteen) days. 2 injections per month  .  triamcinolone (KENALOG) 0.025 % ointment Apply 1 application topically as directed.   . Misc. Devices (QUAD CANE/SMALL BASE) MISC 1 Device by Does not apply route daily.   No facility-administered encounter medications on file as of 12/05/2017.     Functional Status:  In your present state of health, do you have any difficulty performing the following activities: 12/05/2017 12/01/2017  Hearing? N N  Vision? N N  Difficulty concentrating or making decisions? N N  Walking or climbing stairs? Y Y  Comment - -  Dressing or bathing? N N  Doing errands, shopping? N N  Preparing Food and eating ? - -  Using the Toilet? - -  In the past six months, have you accidently leaked urine? - -  Comment - -  Do you have problems with loss of bowel control? - -  Managing your Medications? - -  Managing your Finances? - -  Housekeeping or managing your Housekeeping? - -  Some recent data might be hidden    Fall/Depression Screening: Fall Risk  12/05/2017 06/26/2017 05/14/2017  Falls in the past year? Yes Yes Yes  Number falls in past yr: 2 or more 2 or more 1  Injury with Fall? No No No  Risk Factor Category  High Fall Risk - -  Risk for fall due to : History of fall(s) History of fall(s) -  Risk for fall due to: Comment - - -  Follow up - -  Falls prevention discussed   PHQ 2/9 Scores 12/05/2017 06/26/2017 05/14/2017 05/14/2017 04/11/2017 03/14/2017 11/06/2016  PHQ - 2 Score 1 1 4 5 2 2  0  PHQ- 9 Score - 2 9 11 4 3  -  Exception Documentation - - - - - - -    Assessment: referral received 12/03/17. Client with recent admission 10/26-10/29 with respiratory failure. Primary care listed as completing Transition of care.  67 year old with history of atrial fibrilation, tobacco abuse, COPD, HTN, thyroid disease, kyphoplasty around the 11/28/17.  RNCM called to follow up. Client reports she is getting better slowly. She reports that [place] has been out to see her. She reports recent back surgery  and medications that she was taking following her back surgery did not mix well, resulting in an accidental overdose. She states she is improving every day.  Medications reviewed. Client states [facility] is helping her to manage her medications at this time.  History of COPD-client reports she is interested in education regarding COPD management.   RNCM provided contact number and 24 hour nurse advice line number. Encouraged to call as needed.  Plan: RNCM will follow up telephonicaly in the next 2-3 weeks. Send EMMI material on COPD.  Thea Silversmith, RN, MSN, [place] Coordinator Cell: [phone]

## 2017-12-09 ENCOUNTER — Other Ambulatory Visit: Payer: Self-pay

## 2017-12-09 NOTE — Patient Outreach (Signed)
[place] [facility]) Care Management  12/09/2017  [name] Nov 07, 1950 [id number]  RNCM received notification of EMMI red flag from Friday 12/06/17 re: questions about discharge papers and unfilled prescriptions as well as questions. RNCM called both the home and mobile number. No answer. Unable to leave message.  Plan: RNCM will send unsuccessful letter and follow up within 3-4 business days.  Thea Silversmith, RN, MSN, [place] Coordinator Cell: [phone]

## 2017-12-10 ENCOUNTER — Telehealth: Payer: Self-pay | Admitting: Nurse Practitioner

## 2017-12-10 ENCOUNTER — Other Ambulatory Visit: Payer: Self-pay

## 2017-12-10 NOTE — Patient Outreach (Signed)
[place] [facility]) Care Management  12/10/2017  [name] [birth year] [id number]   EMMI red call:  referral received 12/03/17. Client with recent admission 10/26-10/29 with respiratory failure.   67 year old with history of atrial fibrilation, tobacco abuse, COPD, HTN, thyroid disease, kyphoplasty around the 11/28/17  RNCM received EMMI red notification from yesterday call regarding loss of interest in things. Notifications also updated client able to get prescriptions filled.   RNCM attempted to reach client. This is the second outreach in two days.   Plan: RNCM will send an unsuccessful outreach letter and follow up call within 3-4 business days if no return call.  Thea Silversmith, RN, MSN, [place] Coordinator Cell: [phone]

## 2017-12-10 NOTE — Telephone Encounter (Signed)
Message received.  No need for any changes.  Delay will be okay.

## 2017-12-10 NOTE — Telephone Encounter (Signed)
[name] with [facility] said there would be a delay in care per pt wanting to start tomorrow [phone]

## 2017-12-12 NOTE — Discharge Summary (Signed)
[facility] NAME: [name]    MR#:  [id number]  DATE OF BIRTH:  1950/12/30  DATE OF ADMISSION:  11/30/2017 ADMITTING PHYSICIAN: Amelia Jo, MD  DATE OF DISCHARGE: 12/03/2017  5:48 PM  PRIMARY CARE PHYSICIAN: Mikey College, NP    ADMISSION DIAGNOSIS:  Acute respiratory failure with hypoxia and hypercapnia ([place]) [J96.01, J96.02] Acute respiratory failure ([place]) [J96.00]  DISCHARGE DIAGNOSIS:  Active Problems:   Acute respiratory failure ([place])   SECONDARY DIAGNOSIS:   Past Medical History:  Diagnosis Date  . Anxiety   . Arthritis   . Chronic back pain   . COPD (chronic obstructive pulmonary disease) ([place])   . Depression   . GERD (gastroesophageal reflux disease)   . Hypertension   . Hypothyroidism   . Pneumonia    RECENT X 3  . PONV (postoperative nausea and vomiting)   . Psoriasis   . Thyroid disease     HOSPITAL COURSE:   1.Acute respiratory failure with hypoxia and and hypercapnia.This is likely multifactorial, related to respiratory depression due to side effects from pain medications and other sedatives,  ARF,COPD and possibly obstructive sleep apnea. Initially required treatment with BiPAP, oxygen and avoid use of sedative meds.  Patient would likely need sleep studies as outpatient,after discharge. Respiratory status improved now on nasal cannula oxygen,  tapered back to room air.  2.Acute renal failure,likely prerenal.  gentle IV hydration and monitor kidney function closely. Avoid nephrotoxic medications.  Back to normal now. 3.Acute encephalopathy,related to acute respiratory failure and acute renal failure. We will continue to monitor clinically closely while treating the underlying disease. Improved, fully alert and oriented now. It was due to using multiple meds in combination of gabapentin, Klonopin and oxycodone. Will stop all of them except oxycodone as she had  recent surgery and still pain. 4.Elevated troponin level,at 0.28.This is likely due to demand ischemia,from acute respiratory failure.We will continue to monitor patient on telemetry and follow troponin levels to rule out ACS. check 2D echo to further evaluate cardiac function. Troponin remains stable on further follow-up.  To follow with primary care physician. 5.Tobacco abuse.Smoking cessation will be discussed with patient in detail. 6.COPD with acute exacerbation.We will treat withIV steroids,nebulizers,oxygen and BiPAP. 7.Hypertension, stable, will resume home medications. 8.Status post kyphoplasty 2 days ago.   Patient was able to walk around nursing station with physical therapy and a walker. 9. A fib with RVR- was off cardizem- restarted and on coreg- Converted back in NSR and stable. Defered starting anticoagulation by cardio until seen in clinic. Pt stable 10. Hypothyroidism- TSH was low, decreased dose of Levothyroxine- follow TSH in clinic in 1 month.  DISCHARGE CONDITIONS:   Stable.  CONSULTS OBTAINED:  Treatment Team:  Vaughan Basta, MD Teodoro Spray, MD  DRUG ALLERGIES:   Allergies  Allergen Reactions  . Ciprofloxacin Hives and Diarrhea  . Oxycodone Diarrhea  . Oxycodone-Acetaminophen Diarrhea  . Oxycodone-Acetaminophen Diarrhea  . Sulfa Antibiotics Rash    Throat, mouth Throat, mouth    DISCHARGE MEDICATIONS:   Allergies as of 12/03/2017      Reactions   Ciprofloxacin Hives, Diarrhea   Oxycodone Diarrhea   Oxycodone-acetaminophen Diarrhea   Oxycodone-acetaminophen Diarrhea   Sulfa Antibiotics Rash   Throat, mouth Throat, mouth      Medication List    STOP taking these medications   clonazePAM 1 MG tablet Commonly known as:  KLONOPIN   gabapentin 300 MG capsule Commonly  known as:  NEURONTIN     TAKE these medications   acetaminophen 500 MG tablet Commonly known as:  TYLENOL Take 500 mg by mouth every 6 (six)  hours as needed for moderate pain.   albuterol 108 (90 Base) MCG/ACT inhaler Commonly known as:  PROVENTIL HFA;VENTOLIN HFA Inhale 2 puffs into the lungs every 6 (six) hours as needed for wheezing or shortness of breath.   budesonide-formoterol 160-4.5 MCG/ACT inhaler Commonly known as:  SYMBICORT Inhale 2 puffs into the lungs 2 (two) times daily.   busPIRone 7.5 MG tablet Commonly known as:  BUSPAR Take 1 tablet (7.5 mg total) by mouth 3 (three) times daily.   carvedilol 3.125 MG tablet Commonly known as:  COREG Take 1 tablet (3.125 mg total) by mouth 2 (two) times daily with a meal.   citalopram 40 MG tablet Commonly known as:  CELEXA Take 1 tablet (40 mg total) by mouth daily.   clotrimazole-betamethasone cream Commonly known as:  LOTRISONE Apply 1 application topically 2 (two) times daily.   COSENTYX 150 MG/ML Sosy Generic drug:  Secukinumab Inject 150 mg into the skin every 14 (fourteen) days. 2 injections per month   diltiazem 120 MG 24 hr capsule Commonly known as:  CARDIZEM CD Take 1 capsule (120 mg total) by mouth daily.   DOK 100 MG capsule Generic drug:  docusate sodium Take 100 mg by mouth 2 (two) times daily.   levothyroxine 100 MCG tablet Commonly known as:  SYNTHROID, LEVOTHROID Take 1 tablet (100 mcg total) by mouth daily with breakfast. What changed:    medication strength  how much to take   lisinopril 10 MG tablet Commonly known as:  PRINIVIL,ZESTRIL Take 1 tablet (10 mg total) by mouth daily.   oxyCODONE 5 MG immediate release tablet Commonly known as:  Oxy IR/ROXICODONE Take 5-10 mg by mouth every 4 (four) hours as needed for pain. Take 5-10 mg by mouth every 4-6 hours as needed for pain.   predniSONE 10 MG (21) Tbpk tablet Commonly known as:  STERAPRED UNI-PAK 21 TAB Take 6 tabs first day, 5 tab on day 2, then 4 on day 3rd, 3 tabs on day 4th , 2 tab on day 5th, and 1 tab on 6th day.   Quad Cane/Small Base Misc 1 Device by Does not  apply route daily.   triamcinolone 0.025 % ointment Commonly known as:  KENALOG Apply 1 application topically as directed.        DISCHARGE INSTRUCTIONS:    Follow with Cardio clinic in 1-2 weeks.  If you experience worsening of your admission symptoms, develop shortness of breath, life threatening emergency, suicidal or homicidal thoughts you must seek medical attention immediately by calling 911 or calling your MD immediately  if symptoms less severe.  You Must read complete instructions/literature along with all the possible adverse reactions/side effects for all the Medicines you take and that have been prescribed to you. Take any new Medicines after you have completely understood and accept all the possible adverse reactions/side effects.   Please note  You were cared for by a hospitalist during your hospital stay. If you have any questions about your discharge medications or the care you received while you were in the hospital after you are discharged, you can call the unit and asked to speak with the hospitalist on call if the hospitalist that took care of you is not available. Once you are discharged, your primary care physician will handle any further medical issues. Please  note that NO REFILLS for any discharge medications will be authorized once you are discharged, as it is imperative that you return to your primary care physician (or establish a relationship with a primary care physician if you do not have one) for your aftercare needs so that they can reassess your need for medications and monitor your lab values.    Today   CHIEF COMPLAINT:   Chief Complaint  Patient presents with  . Drug Overdose    HISTORY OF PRESENT ILLNESS:  [name]  is a 67 y.o. female with a known history of tobacco abuse, COPD, hypertension, thyroid disease, hypothyroidism and other comorbidities.  Per family, patient had back surgery/kyphoplasty 48 hours ago. Patient is currently drowsy and  confused, unable to provide history.  Information was taken from reviewing the medical chart and from discussion with emergency room physician. She was brought to emergency room for altered mental status.  Per family, patient to come oxycodone earlier today. Patient was given Narcan and she was placed on BiPAP in the emergency room with some improvement in her mental status. No reports of fever, chills, vomiting, diarrhea, bleeding. Blood test done emergency room showed sodium 134, BUN 31, creatinine 1.62, troponin at 0.28, WBC 14.2. Venous blood gas showed pH is 7.17, PCO2 at 93 and PO2 lower than 31;  Bicarbonate 33.9.  UA is positive for rare bacteria, urine culture result is pending. EKG shows normal sinus rhythm, with heart rate at 82 bpm, normal intervals, no acute ischemic changes. No acute cardiopulmonary abnormalities per chest x-ray.   VITAL SIGNS:  Blood pressure (!) 145/86, pulse 75, temperature 98.5 F (36.9 C), temperature source Oral, resp. rate 19, height 5\' 2"  (1.575 m), weight 80.6 kg, SpO2 97 %.  I/O:  No intake or output data in the 24 hours ending 12/12/17 0743  PHYSICAL EXAMINATION:   GENERAL:  67 y.o.-year-old patient lying in the bed with no acute distress.  EYES: Pupils equal, round, reactive to light and accommodation. No scleral icterus. Extraocular muscles intact.  HEENT: Head atraumatic, normocephalic. Oropharynx and nasopharynx clear.  NECK:  Supple, no jugular venous distention. No thyroid enlargement, no tenderness.  LUNGS: Normal breath sounds bilaterally, no wheezing, rales,rhonchi or crepitation. No use of accessory muscles of respiration.  CARDIOVASCULAR: S1, S2 normal. No murmurs, rubs, or gallops.  ABDOMEN: Soft, nontender, nondistended. Bowel sounds present. No organomegaly or mass.  Lower back tenderness. EXTREMITIES: No pedal edema, cyanosis, or clubbing.  NEUROLOGIC: Cranial nerves II through XII are intact. Muscle strength 4/5 in all extremities.  Sensation intact. Gait not checked.  PSYCHIATRIC: The patient is alert and oriented x 3.  SKIN: No obvious rash, lesion, or ulcer.   DATA REVIEW:   CBC No results for input(s): WBC, HGB, HCT, PLT in the last 168 hours.  Chemistries  No results for input(s): NA, K, CL, CO2, GLUCOSE, BUN, CREATININE, CALCIUM, MG, AST, ALT, ALKPHOS, BILITOT in the last 168 hours.  Invalid input(s): GFRCGP  Cardiac Enzymes No results for input(s): TROPONINI in the last 168 hours.  Microbiology Results  Results for orders placed or performed during the hospital encounter of 11/30/17  MRSA PCR Screening     Status: None   Collection Time: 11/30/17 11:46 PM  Result Value Ref Range Status   MRSA by PCR NEGATIVE NEGATIVE Final    Comment:        The GeneXpert MRSA Assay (FDA approved for NASAL specimens only), is one component of a comprehensive MRSA colonization surveillance  program. It is not intended to diagnose MRSA infection nor to guide or monitor treatment for MRSA infections. Performed at [facility], [address] [place], [place] [address]     RADIOLOGY:  No results found.  EKG:   Orders placed or performed during the hospital encounter of 11/30/17  . ED EKG  . ED EKG  . EKG 12-Lead  . EKG 12-Lead  . EKG 12-Lead  . EKG 12-Lead      Management plans discussed with the patient, family and they are in agreement.  CODE STATUS:  Code Status History    Date Active Date Inactive Code Status Order ID Comments User Context   11/30/2017 2303 12/03/2017 2140 Full Code [id number]  Amelia Jo, MD ED   10/28/2017 1621 10/30/2017 1351 Full Code [id number]  Dustin Flock, MD Inpatient   12/17/2015 1415 12/20/2015 2046 Full Code [id number]  Epifanio Lesches, MD ED      TOTAL TIME TAKING CARE OF THIS PATIENT: 35 minutes.    Vaughan Basta M.D on 12/12/2017 at 7:43 AM  Between 7am to 6pm - Pager - [phone]  After 6pm go to [email or web address] - password [email or web address]  [facility]  Office  [phone]  CC: Primary care physician; Mikey College, NP   Note: This dictation was prepared with Dragon dictation along with smaller phrase technology. Any transcriptional errors that result from this process are unintentional.

## 2017-12-16 ENCOUNTER — Other Ambulatory Visit: Payer: Self-pay

## 2017-12-16 DIAGNOSIS — F410 Panic disorder [episodic paroxysmal anxiety] without agoraphobia: Secondary | ICD-10-CM

## 2017-12-16 NOTE — Patient Outreach (Addendum)
[facility]) Care Management  12/16/2017  JEILANI GRUPE [birth year] [id number]  Assessment: 67 year old with history of atrial fibrilation, tobacco abuse, COPD, HTN, thyroid disease, kyphoplasty around the 11/28/17  RNCM called to follow up. Client reports "everything is fine". She denies any shortness of breath. She states she was on her to go take a nap when [facility] called. Client she states she continues to have interest in things and is enjoying her christmas movies on TV. She states she has a lot of church family/friends checking in on her and states she see her best friend often. Client is expressing no questions or concerns.  Plan: RNCM will continue to follow.  Thea Silversmith, RN, MSN, [place] Coordinator Cell: [phone]  12/18/17 Addendum: Plan: follow up with client telephonically next month.

## 2017-12-17 MED ORDER — BUSPIRONE HCL 7.5 MG PO TABS: 7.5000 mg | ORAL_TABLET | Freq: Three times a day (TID) | ORAL | 5 refills | Status: DC

## 2017-12-19 ENCOUNTER — Ambulatory Visit: Payer: PPO

## 2017-12-24 ENCOUNTER — Telehealth: Payer: Self-pay | Admitting: Nurse Practitioner

## 2017-12-24 DIAGNOSIS — I10 Essential (primary) hypertension: Secondary | ICD-10-CM

## 2017-12-24 NOTE — Telephone Encounter (Signed)
Pt needs refill on lisinopril sent to [facility]

## 2017-12-25 MED ORDER — LISINOPRIL 10 MG PO TABS: 10.0000 mg | ORAL_TABLET | Freq: Every day | ORAL | 1 refills | Status: DC

## 2017-12-25 NOTE — Telephone Encounter (Signed)
Refilled med and patient notified.

## 2017-12-27 ENCOUNTER — Other Ambulatory Visit: Payer: Self-pay

## 2017-12-30 ENCOUNTER — Emergency Department
Admission: EM | Admit: 2017-12-30 | Discharge: 2017-12-30 | Disposition: A | Discharge status: Home / Self Care | Payer: PPO | Attending: Emergency Medicine | Admitting: Emergency Medicine

## 2017-12-30 ENCOUNTER — Other Ambulatory Visit: Payer: Self-pay

## 2017-12-30 ENCOUNTER — Encounter: Payer: Self-pay | Admitting: Emergency Medicine

## 2017-12-30 ENCOUNTER — Ambulatory Visit: Payer: Self-pay | Admitting: Nurse Practitioner

## 2017-12-30 DIAGNOSIS — R51 Headache: Secondary | ICD-10-CM | POA: Insufficient documentation

## 2017-12-30 DIAGNOSIS — R197 Diarrhea, unspecified: Secondary | ICD-10-CM | POA: Diagnosis not present

## 2017-12-30 DIAGNOSIS — R5383 Other fatigue: Secondary | ICD-10-CM | POA: Diagnosis not present

## 2017-12-30 DIAGNOSIS — F17210 Nicotine dependence, cigarettes, uncomplicated: Secondary | ICD-10-CM | POA: Diagnosis not present

## 2017-12-30 DIAGNOSIS — R112 Nausea with vomiting, unspecified: Secondary | ICD-10-CM | POA: Diagnosis not present

## 2017-12-30 DIAGNOSIS — E039 Hypothyroidism, unspecified: Secondary | ICD-10-CM | POA: Diagnosis not present

## 2017-12-30 DIAGNOSIS — I10 Essential (primary) hypertension: Secondary | ICD-10-CM | POA: Insufficient documentation

## 2017-12-30 DIAGNOSIS — J449 Chronic obstructive pulmonary disease, unspecified: Secondary | ICD-10-CM | POA: Insufficient documentation

## 2017-12-30 DIAGNOSIS — Z79899 Other long term (current) drug therapy: Secondary | ICD-10-CM | POA: Diagnosis not present

## 2017-12-30 LAB — COMPREHENSIVE METABOLIC PANEL
ALK PHOS: 103 U/L (ref 38–126)
ALT: 15 U/L (ref 0–44)
AST: 17 U/L (ref 15–41)
Albumin: 4.2 g/dL (ref 3.5–5.0)
Anion gap: 9 (ref 5–15)
BUN: 13 mg/dL (ref 8–23)
CALCIUM: 9.3 mg/dL (ref 8.9–10.3)
CO2: 29 mmol/L (ref 22–32)
CREATININE: 0.81 mg/dL (ref 0.44–1.00)
Chloride: 100 mmol/L (ref 98–111)
GFR calc Af Amer: >60 mL/min (ref >60)
Glucose, Bld: 154 mg/dL — ABNORMAL HIGH (ref 70–99)
Potassium: 3.4 mmol/L — ABNORMAL LOW (ref 3.5–5.1)
Sodium: 138 mmol/L (ref 135–145)
Total Bilirubin: 1.1 mg/dL (ref 0.3–1.2)
Total Protein: 7.1 g/dL (ref 6.5–8.1)

## 2017-12-30 LAB — URINALYSIS, ROUTINE W REFLEX MICROSCOPIC
Bilirubin Urine: NEGATIVE
GLUCOSE, UA: NEGATIVE mg/dL
Hgb urine dipstick: NEGATIVE
Ketones, ur: NEGATIVE mg/dL
LEUKOCYTES UA: NEGATIVE
Nitrite: NEGATIVE
PH: 6 (ref 5.0–8.0)
Protein, ur: NEGATIVE mg/dL
Specific Gravity, Urine: 1.013 (ref 1.005–1.030)

## 2017-12-30 LAB — CBC WITH DIFFERENTIAL/PLATELET
Abs Immature Granulocytes: 0.02 10*3/uL (ref 0.00–0.07)
Basophils Absolute: 0.1 10*3/uL (ref 0.0–0.1)
Basophils Relative: 1 %
EOS ABS: 0.4 10*3/uL (ref 0.0–0.5)
EOS PCT: 6 %
HCT: 49.5 % — ABNORMAL HIGH (ref 36.0–46.0)
HEMOGLOBIN: 16.1 g/dL — AB (ref 12.0–15.0)
Immature Granulocytes: 0 %
Lymphocytes Relative: 33 %
Lymphs Abs: 2.4 10*3/uL (ref 0.7–4.0)
MCH: 29.7 pg (ref 26.0–34.0)
MCHC: 32.5 g/dL (ref 30.0–36.0)
MCV: 91.3 fL (ref 80.0–100.0)
MONO ABS: 0.7 10*3/uL (ref 0.1–1.0)
Monocytes Relative: 9 %
Neutro Abs: 3.7 10*3/uL (ref 1.7–7.7)
Neutrophils Relative %: 51 %
Platelets: 283 10*3/uL (ref 150–400)
RBC: 5.42 MIL/uL — AB (ref 3.87–5.11)
RDW: 13.3 % (ref 11.5–15.5)
WBC: 7.2 10*3/uL (ref 4.0–10.5)
nRBC: 0 % (ref 0.0–0.2)

## 2017-12-30 LAB — MAGNESIUM: Magnesium: 2 mg/dL (ref 1.7–2.4)

## 2017-12-30 LAB — TROPONIN I: Troponin I: <0.03 ng/mL (ref <0.03)

## 2017-12-30 LAB — LIPASE, BLOOD: LIPASE: 31 U/L (ref 11–51)

## 2017-12-30 MED ORDER — OXYCODONE-ACETAMINOPHEN 5-325 MG PO TABS
1.0000 | ORAL_TABLET | Freq: Once | ORAL | Status: AC
  Administered 2017-12-30: 1 via ORAL
  Filled 2017-12-30: qty 1

## 2017-12-30 MED ORDER — ONDANSETRON 4 MG PO TBDP: ORAL_TABLET | ORAL | 0 refills | Status: DC

## 2017-12-30 MED ORDER — POTASSIUM CHLORIDE CRYS ER 20 MEQ PO TBCR
40.0000 meq | EXTENDED_RELEASE_TABLET | Freq: Once | ORAL | Status: AC
  Administered 2017-12-30: 40 meq via ORAL
  Filled 2017-12-30: qty 2

## 2017-12-30 MED ORDER — ONDANSETRON HCL 4 MG/2ML IJ SOLN
4.0000 mg | Freq: Once | INTRAMUSCULAR | Status: AC
  Administered 2017-12-30: 4 mg via INTRAVENOUS

## 2017-12-30 MED ORDER — SODIUM CHLORIDE 0.9 % IV BOLUS
500.0000 mL | Freq: Once | INTRAVENOUS | Status: AC
  Administered 2017-12-30: 500 mL via INTRAVENOUS

## 2017-12-30 MED ORDER — ONDANSETRON HCL 4 MG/2ML IJ SOLN
INTRAMUSCULAR | Status: AC
  Administered 2017-12-30: 4 mg via INTRAVENOUS
  Filled 2017-12-30: qty 2

## 2017-12-30 NOTE — ED Notes (Signed)
Pt given coke per pt request

## 2017-12-30 NOTE — Patient Outreach (Addendum)
[facility]) Care Management  12/30/2017  [name] 06-Nov-1950 [id number]     67 year old with history of atrial fibrillation, tobacco abuse, COPD, HTN, thyroid disease. History of kyphoplasty around 11/28/17 at [facility] in [place]. Then client was admitted on 11/30/17 to [facility] due to respiratory depression - per chart Multifactorial (side effects of pain medications and other sedative, ARF, COPD and possible  OSA.).   Per chart, client presented to the ED today with complaint of nausea/vomiting/headache and concern for dehydration. Per ED note, could be due to client stopping pain medication "cold [place]".   RNCM called to follow up. No answer. HIPPA compliant message left.  Plan: await return call. RNCM will attempt to contact client within the next 3-4 days.  Thea Silversmith, RN, MSN, [place] Coordinator Cell: [phone]

## 2017-12-30 NOTE — ED Notes (Signed)
Pt given sprite per pt's request

## 2017-12-30 NOTE — ED Triage Notes (Signed)
Pt recently had back surgery and took percocet for one month. Pt states she did not like the way they made her feel and stopped taking them last Monday. Pt states she had been "sick on her stomach" for the last few days including nausea, vomiting, and diarrhea. PT arrives a& o x 4 with complaints of headache.

## 2017-12-30 NOTE — ED Provider Notes (Addendum)
[facility] Emergency Department Provider Note  ____________________________________________   First MD Initiated Contact with Patient 12/30/17 [id number]     (approximate)  I have reviewed the triage vital signs and the nursing notes.   HISTORY  Chief Complaint Headache and Nausea    HPI [name] is a 67 y.o. female with extensive chronic medical history who reportedly had lumbar spine back surgery by Dr. Rudene Christians a little over a month ago.  She presents by EMS tonight for evaluation of "just not feeling well" for the last few days to a week.  She reports that she had been on Percocet for at least a month and did not like the way it made her feel so she stopped taking them on her own 1 week ago.  Since that time she has felt nauseated, had diarrhea, had some vomiting, and had a headache.  She has not had any falls and has sustained no trauma.  She denies abdominal pain and dysuria.  She has had no neck pain, chest pain, nor shortness of breath.  She describes the symptoms as moderate and gradual in onset but are becoming concerning to her.  Nothing in particular makes her feel better nor worse.  She is concerned she may be dehydrated because she feels generally tired and fatigued.  Past Medical History:  Diagnosis Date  . Anxiety   . Arthritis   . Chronic back pain   . COPD (chronic obstructive pulmonary disease) ([place])   . Depression   . GERD (gastroesophageal reflux disease)   . Hypertension   . Hypothyroidism   . Pneumonia    RECENT X 3  . PONV (postoperative nausea and vomiting)   . Psoriasis   . Thyroid disease     Patient Active Problem List   Diagnosis Date Noted  . Acute respiratory failure ([place]) 11/30/2017  . Thoracic aortic atherosclerosis ([place]) 02/12/2017  . Cystocele, midline 11/08/2016  . Vaginal atrophy 11/08/2016  . Chronic left hip pain 05/07/2016  . Insomnia 01/10/2016  . COPD (chronic obstructive pulmonary disease) with chronic  bronchitis ([place]) 12/17/2015  . Bilateral carotid artery stenosis 08/22/2015  . Atherosclerotic peripheral vascular disease with intermittent claudication ([place]) 08/04/2015  . Benign essential HTN 08/04/2015  . Mixed hyperlipidemia 08/04/2015  . Abnormal finding on EKG 07/25/2015  . Gravida 2 para 2 07/01/2015  . Panic disorder without agoraphobia with moderate panic attacks 07/01/2015  . Prediabetes 06/09/2015  . Lichen sclerosus 45/36/4680  . Abdominal pain, LLQ 02/01/2015  . Chronic LBP 09/16/2014  . Clinical depression 08/19/2014  . Adult hypothyroidism 08/19/2014  . Anxiety disorder 06/23/2014  . Other long term (current) drug therapy 08/05/2012  . Bladder retention 10/25/2008  . Lumbar canal stenosis 10/21/2008  . Current tobacco use 09/19/2008  . Degeneration of intervertebral disc of lumbosacral region 03/22/2008    Past Surgical History:  Procedure Laterality Date  . ABDOMINAL HYSTERECTOMY     heavy bleeding  . bladder stimulator    . CARPAL TUNNEL RELEASE    . CATARACT EXTRACTION W/PHACO Right 03/07/2015   Procedure: CATARACT EXTRACTION PHACO AND INTRAOCULAR LENS PLACEMENT (IOC);  Surgeon: Estill Cotta, MD;  Location: [facility] ORS;  Service: Ophthalmology;  Laterality: Right;  [name] 01:14 AP% 26.4 CDE 33.04 fluid pack lot # [id number]  . CATARACT EXTRACTION W/PHACO Left 03/21/2015   Procedure: CATARACT EXTRACTION PHACO AND INTRAOCULAR LENS PLACEMENT (IOC);  Surgeon: Estill Cotta, MD;  Location: [facility] ORS;  Service: Ophthalmology;  Laterality: Left;  [name]  01:21 AP% 25.6 CDE 39.78 fluid pack lot # [id number]  . HAND SURGERY Right   . INTERSTIM IMPLANT REMOVAL N/A 09/07/2015   Procedure: REMOVAL OF INTERSTIM IMPLANT;  Surgeon: Bjorn Loser, MD;  Location: [facility] ORS;  Service: Urology;  Laterality: N/A;  . OOPHORECTOMY Right    ovarian cyst    Prior to Admission medications   Medication Sig Start Date End Date Taking? Authorizing Provider  acetaminophen (TYLENOL) 500 MG  tablet Take 500 mg by mouth every 6 (six) hours as needed for moderate pain.     [provider]  albuterol (PROVENTIL HFA;VENTOLIN HFA) 108 (90 Base) MCG/ACT inhaler Inhale 2 puffs into the lungs every 6 (six) hours as needed for wheezing or shortness of breath. 11/28/17   Mikey College, NP  budesonide-formoterol [facility]) 160-4.5 MCG/ACT inhaler Inhale 2 puffs into the lungs 2 (two) times daily. 09/27/17   Mikey College, NP  busPIRone (BUSPAR) 7.5 MG tablet Take 1 tablet (7.5 mg total) by mouth 3 (three) times daily. 12/17/17   Mikey College, NP  carvedilol (COREG) 3.125 MG tablet Take 1 tablet (3.125 mg total) by mouth 2 (two) times daily with a meal. 12/03/17   Vaughan Basta, MD  citalopram (CELEXA) 40 MG tablet Take 1 tablet (40 mg total) by mouth daily. 11/08/17   Mikey College, NP  clotrimazole-betamethasone (LOTRISONE) cream Apply 1 application topically 2 (two) times daily. 11/08/16   Gae Dry, MD  diltiazem (CARDIZEM CD) 120 MG 24 hr capsule Take 1 capsule (120 mg total) by mouth daily. 07/24/17   Mikey College, NP  DOK 100 MG capsule Take 100 mg by mouth 2 (two) times daily. 11/29/17   [provider]  levothyroxine (SYNTHROID, LEVOTHROID) 100 MCG tablet Take 1 tablet (100 mcg total) by mouth daily with breakfast. 12/04/17   Vaughan Basta, MD  lisinopril (PRINIVIL,ZESTRIL) 10 MG tablet Take 1 tablet (10 mg total) by mouth daily. 12/25/17   Mikey College, NP  Misc. Devices (QUAD CANE/SMALL BASE) MISC 1 Device by Does not apply route daily. 06/26/17   Mikey College, NP  ondansetron (ZOFRAN ODT) 4 MG disintegrating tablet Allow 1-2 tablets to dissolve in your mouth every 8 hours as needed for nausea/vomiting 12/30/17   Hinda Kehr, MD  oxyCODONE (OXY IR/ROXICODONE) 5 MG immediate release tablet Take 5-10 mg by mouth every 4 (four) hours as needed for pain. Take 5-10 mg by mouth every 4-6 hours as  needed for pain. 11/29/17   [provider]  predniSONE (STERAPRED UNI-PAK 21 TAB) 10 MG (21) TBPK tablet Take 6 tabs first day, 5 tab on day 2, then 4 on day 3rd, 3 tabs on day 4th , 2 tab on day 5th, and 1 tab on 6th day. 12/03/17   Vaughan Basta, MD  Secukinumab (COSENTYX) 150 MG/ML SOSY Inject 150 mg into the skin every 14 (fourteen) days. 2 injections per month    [provider]  triamcinolone (KENALOG) 0.025 % ointment Apply 1 application topically as directed.  02/08/16   [provider]    Allergies Ciprofloxacin; Oxycodone; Oxycodone-acetaminophen; Oxycodone-acetaminophen; and Sulfa antibiotics  Family History  Problem Relation Age of Onset  . Heart disease Mother   . Breast cancer Mother   . Heart attack Mother   . COPD Father   . Stroke Paternal Uncle   . Kidney disease Sister   . Breast cancer Cousin   . Breast cancer Cousin   . Heart attack Maternal  Aunt   . Stroke Paternal Grandfather   . Kidney cancer Neg Hx   . Bladder Cancer Neg Hx     Social History Social History   Tobacco Use  . Smoking status: Current Every Day Smoker    Packs/day: 1.00    Years: 35.00    Pack years: 35.00    Types: Cigarettes  . Smokeless tobacco: Never Used  . Tobacco comment: Previously smoked 2 ppd  Substance Use Topics  . Alcohol use: No  . Drug use: No    Review of Systems Constitutional: No fever/chills.  General malaise and fatigue Eyes: No visual changes. ENT: No sore throat. Cardiovascular: Denies chest pain. Respiratory: Denies shortness of breath. Gastrointestinal: No abdominal pain.  Diarrhea, nausea, and vomiting since discontinuing Percocet last week. Genitourinary: Negative for dysuria. Musculoskeletal: Negative for neck pain.  Negative for back pain. Integumentary: Negative for rash. Neurological: Negative for headaches, focal weakness or numbness.   ____________________________________________   PHYSICAL EXAM:  VITAL  SIGNS: ED Triage Vitals  Enc Vitals Group     BP 12/30/17 0341 (!) 153/90     Pulse Rate 12/30/17 0341 81     Resp 12/30/17 0341 18     Temp 12/30/17 0341 97.9 F (36.6 C)     Temp Source 12/30/17 0341 Oral     SpO2 12/30/17 0341 97 %     Weight 12/30/17 0342 80.6 kg (177 lb 11.1 oz)     Height 12/30/17 0342 1.6 m (5\' 3" )     Head Circumference --      Peak Flow --      Pain Score 12/30/17 0342 8     Pain Loc --      Pain Edu? --      Excl. in [place]? --     Constitutional: Alert and oriented.  Anxious but generally well-appearing. Eyes: Conjunctivae are normal.  Head: Atraumatic. Nose: No congestion/rhinnorhea. Mouth/Throat: Mucous membranes are moist. Neck: No stridor.  No meningeal signs.   Cardiovascular: Normal rate, regular rhythm. Good peripheral circulation. Grossly normal heart sounds. Respiratory: Normal respiratory effort.  No retractions. Lungs CTAB. Gastrointestinal: Soft and nontender. No distention.  Musculoskeletal: No lower extremity tenderness nor edema. No gross deformities of extremities. Neurologic:  Normal speech and language. No gross focal neurologic deficits are appreciated.  Skin:  Skin is warm, dry and intact. No rash noted.  Well-healed surgical wound on the midline of the lumbar spine.  No fluctuance, induration, no erythema to suggest infectious process. Psychiatric: Mood and affect are anxious but generally appropriate. Speech and behavior are normal.  ____________________________________________   LABS (all labs ordered are listed, but only abnormal results are displayed)  Labs Reviewed  CBC WITH DIFFERENTIAL/PLATELET - Abnormal; Notable for the following components:      Result Value   RBC 5.42 (*)    Hemoglobin 16.1 (*)    HCT 49.5 (*)    All other components within normal limits  COMPREHENSIVE METABOLIC PANEL - Abnormal; Notable for the following components:   Potassium 3.4 (*)    Glucose, Bld 154 (*)    All other components within  normal limits  URINALYSIS, ROUTINE W REFLEX MICROSCOPIC - Abnormal; Notable for the following components:   Color, Urine YELLOW (*)    APPearance CLEAR (*)    All other components within normal limits  LIPASE, BLOOD  TROPONIN I  MAGNESIUM   ____________________________________________  EKG  No indication for EKG ____________________________________________  RADIOLOGY   ED MD interpretation:  No indication for imaging  Official radiology report(s): No results found.  ____________________________________________   PROCEDURES  Critical Care performed: No   Procedure(s) performed:   Procedures   ____________________________________________   INITIAL IMPRESSION / ASSESSMENT AND PLAN / ED COURSE  As part of my medical decision making, I reviewed the following data within the [facility] notes reviewed and incorporated, Labs reviewed , Old chart reviewed and Notes from prior ED visits    Differential diagnosis includes, but is not limited to, narcotic withdrawal, viral illness, metabolic abnormality, electrolyte abnormality, acute infectious viral or bacterial gastroenteritis.  Stable vital signs and is in no distress other than being anxious about her symptoms.  I think it makes sense that she may be having some withdrawal symptoms after being on opioids for a month and then discontinuing them "cold [place]".  I will check basic labs including a metabolic panel, CBC, lipase, troponin, and urinalysis.  No indication for imaging at this time.  I explained to her that if she is able to provide a stool specimen we will check for C. difficile and the GI pathogen panel.  She said that she last had a bowel movement about 3 hours ago and it was copious and watery.  Clinical Course as of Dec 30 712  [facility] Dec 30, 2017  1607 Reassuring CBC, slightly elevated hemoglobin may represent some hemoconcentration.  CBC with Differential/Platelet(!) [CF]  [id number] The  patient is resting comfortably.  She has not provided a stool sample which I think is reassuring.  We will see if she can provide a urine specimen, but otherwise she is appropriate and ready for discharge, particularly given that she has not had any dysuria or other urinary symptoms.I encouraged close outpatient follow-up and she reports that she has an appointment scheduled for a week from today even if she cannot get in sooner.  I gave my usual and customary return precautions.   [CF]  3710 Normal UA, no evidence of infection.  Urinalysis, Routine w reflex microscopic(!) [CF]    Clinical Course User Index [CF] Hinda Kehr, MD    ____________________________________________  FINAL CLINICAL IMPRESSION(S) / ED DIAGNOSES  Final diagnoses:  Non-intractable vomiting with nausea, unspecified vomiting type     MEDICATIONS GIVEN DURING THIS VISIT:  Medications  oxyCODONE-acetaminophen (PERCOCET/ROXICET) 5-325 MG per tablet 1 tablet (1 tablet Oral Given 12/30/17 0354)  ondansetron (ZOFRAN) injection 4 mg (4 mg Intravenous Given 12/30/17 0407)  sodium chloride 0.9 % bolus 500 mL (0 mLs Intravenous Stopped 12/30/17 0543)  potassium chloride SA (K-DUR,KLOR-CON) CR tablet 40 mEq (40 mEq Oral Given 12/30/17 0551)     ED Discharge Orders         Ordered    ondansetron (ZOFRAN ODT) 4 MG disintegrating tablet     12/30/17 6269           Note:  This document was prepared using Dragon voice recognition software and may include unintentional dictation errors.    Hinda Kehr, MD 12/30/17 4854    Hinda Kehr, MD 12/30/17 [phone]

## 2017-12-30 NOTE — ED Notes (Signed)
Pt given saltine crackers. 

## 2017-12-30 NOTE — ED Notes (Signed)
Patient denies pain. Reports feeling better with medications given earlier this morning. Awaiting further plan of care.

## 2017-12-30 NOTE — ED Notes (Signed)
PT ambulated to the toilet in room with a steady gait.

## 2017-12-30 NOTE — Discharge Instructions (Signed)
Your workup in the Emergency Department today was reassuring.  We did not find any specific abnormalities.  It is possible you are having some side effects (narcotics withdrawal) from suddenly coming off of the Percocet you were taking for a month.    We recommend you drink plenty of fluids, take your regular medications and/or any new ones prescribed today, and follow up with the doctor(s) listed in these documents as recommended.  Return to the Emergency Department if you develop new or worsening symptoms that concern you.

## 2018-01-06 ENCOUNTER — Ambulatory Visit (INDEPENDENT_AMBULATORY_CARE_PROVIDER_SITE_OTHER): Payer: PPO | Admitting: Nurse Practitioner

## 2018-01-06 ENCOUNTER — Other Ambulatory Visit: Payer: Self-pay

## 2018-01-06 ENCOUNTER — Encounter: Payer: Self-pay | Admitting: Nurse Practitioner

## 2018-01-06 VITALS — BP 115/48 | HR 73 | Temp 97.8°F | Resp 17 | Ht 63.0 in | Wt 174.0 lb

## 2018-01-06 DIAGNOSIS — F410 Panic disorder [episodic paroxysmal anxiety] without agoraphobia: Secondary | ICD-10-CM

## 2018-01-06 DIAGNOSIS — E039 Hypothyroidism, unspecified: Secondary | ICD-10-CM | POA: Diagnosis not present

## 2018-01-06 DIAGNOSIS — F3341 Major depressive disorder, recurrent, in partial remission: Secondary | ICD-10-CM

## 2018-01-06 DIAGNOSIS — I10 Essential (primary) hypertension: Secondary | ICD-10-CM

## 2018-01-06 MED ORDER — LISINOPRIL 10 MG PO TABS: 10.0000 mg | ORAL_TABLET | Freq: Every day | ORAL | 0 refills | Status: DC

## 2018-01-06 MED ORDER — CITALOPRAM HYDROBROMIDE 40 MG PO TABS: 40.0000 mg | ORAL_TABLET | Freq: Every day | ORAL | 1 refills | Status: DC

## 2018-01-06 MED ORDER — CLONAZEPAM 1 MG PO TABS: ORAL_TABLET | ORAL | 2 refills | Status: DC

## 2018-01-06 MED ORDER — CARVEDILOL 3.125 MG PO TABS: 3.1250 mg | ORAL_TABLET | Freq: Two times a day (BID) | ORAL | 0 refills | Status: DC

## 2018-01-06 MED ORDER — BUSPIRONE HCL 7.5 MG PO TABS: 7.5000 mg | ORAL_TABLET | Freq: Three times a day (TID) | ORAL | 5 refills | Status: DC

## 2018-01-06 MED ORDER — DILTIAZEM HCL ER COATED BEADS 120 MG PO CP24: 120.0000 mg | ORAL_CAPSULE | Freq: Every day | ORAL | 0 refills | Status: DC

## 2018-01-06 MED ORDER — LEVOTHYROXINE SODIUM 100 MCG PO TABS: 100.0000 ug | ORAL_TABLET | Freq: Every day | ORAL | 0 refills | Status: DC

## 2018-01-06 NOTE — Assessment & Plan Note (Signed)
Stable symptoms currently.  Previously over replaced with levothyroxine on last lab check.  Taking 100 mcg levothyroxine daily.  Plan: 1. Recheck TSH 4 weeks.  Adjust dose levothyroxine prn. 2. Follow-up 3 months.

## 2018-01-06 NOTE — Progress Notes (Signed)
Subjective:    Patient ID: [name], female    DOB: [birth year], 67 y.o.   MRN: [id number]  [name] is a 67 y.o. female presenting on 01/06/2018 for Anxiety   HPI COPD Patient stopped smoking successfully about 2 weeks ago.  She already notes decrease in cough to nearly none, no audible wheezing, and less shortness of breath.  She is not currently having any cravings for cigarettes and is already starting to notice that cigarette smokes "stinks" when she is around it now. - Continues Symbicort and albuterol prn  Hypertension - She is not checking BP at home or outside of clinic.    - Current medications per Dr. Nehemiah Massed.  Patient admits she needs single 30-day supplies to bridge to her next appointment on 12/31 - She is not currently symptomatic. - Pt denies headache, lightheadedness, dizziness, changes in vision, chest tightness/pressure, palpitations, leg swelling, sudden loss of speech or loss of consciousness.  Hypothyroidism - Pt states she is taking her levothyroxine 100 mcg in the am at least 1 hour before eating or drinking and taking other medicines.   - She is not currently symptomatic and is feeling better since reducing her levothyroxine dose a couple weeks ago in hospital. - She denies fatigue, excess energy, weight changes, heart racing, heart palpitations, heat and cold intolerance, changes in hair/skin/nails, and lower leg swelling.  - She does not have any compressive symptoms to include difficulty swallowing, globus sensation, or difficulty breathing when lying flat.   Anxiety Feels real anxious, dizzy/shaky/sensation of passing out.  Has some heart racing, short of breath.  - This panic attack reaction occurs "several times per week."   GAD 7 : Generalized Anxiety Score 01/06/2018 06/26/2017 05/14/2017 03/14/2017  Nervous, Anxious, on Edge 3 3 3 2   Control/stop worrying 0 1 2 2   Worry too much - different things 1 3 3 2   Trouble relaxing 0 1 2 1   Restless 1 1 0 0   Easily annoyed or irritable 0 0 0 0  Afraid - awful might happen 0 0 0 0  Total GAD 7 Score 5 9 10 7   Anxiety Difficulty Not difficult at all Somewhat difficult Not difficult at all Not difficult at all     Depression screen [facility] 2/9 01/06/2018 12/05/2017 06/26/2017 05/14/2017 05/14/2017  Decreased Interest 0 0 0 2 2  Down, Depressed, Hopeless 1 1 1 2 3   PHQ - 2 Score 1 1 1 4 5   Altered sleeping 2 - 0 3 3  Tired, decreased energy 0 - 1 2 3   Change in appetite 0 - 0 0 0  Feeling bad or failure about yourself  0 - 0 0 0  Trouble concentrating 0 - 0 0 0  Moving slowly or fidgety/restless 0 - 0 0 0  Suicidal thoughts 0 - 0 0 0  PHQ-9 Score 3 - 2 9 11   Difficult doing work/chores Not difficult at all - Not difficult at all Not difficult at all Not difficult at all  Some recent data might be hidden    Social History   Tobacco Use  . Smoking status: Former Smoker    Years: 35.00    Types: Cigarettes    Last attempt to quit: 12/23/2017    Years since quitting: 0.0  . Smokeless tobacco: Never Used  . Tobacco comment: Previously smoked 2 ppd  Substance Use Topics  . Alcohol use: No  . Drug use: No    Review of  Systems Per HPI unless specifically indicated above     Objective:    BP (!) 115/48 (BP Location: Right Arm, Patient Position: Sitting, Cuff Size: Large)   Pulse 73   Temp 97.8 F (36.6 C) (Oral)   Resp 17   Ht 5\' 3"  (1.6 m)   Wt 174 lb (78.9 kg)   SpO2 97%   BMI 30.82 kg/m   Wt Readings from Last 3 Encounters:  01/06/18 174 lb (78.9 kg)  12/30/17 177 lb 11.1 oz (80.6 kg)  12/03/17 177 lb 12.8 oz (80.6 kg)    Physical Exam  Constitutional: She is oriented to person, place, and time. She appears well-developed and well-nourished. No distress.  HENT:  Head: Normocephalic and atraumatic.  Eyes: Pupils are equal, round, and reactive to light. Conjunctivae and EOM are normal.  Neck: Normal range of motion. Neck supple. No thyromegaly present.  Cardiovascular: Normal  rate, regular rhythm, S1 normal, S2 normal, normal heart sounds and intact distal pulses.  Pulmonary/Chest: Effort normal. No respiratory distress. She has decreased breath sounds (throughout all lobes). She has no wheezes. She has no rhonchi. She has no rales.  Patient continues to smell of cigarette smoke  Neurological: She is alert and oriented to person, place, and time.  Skin: Skin is warm and dry. Capillary refill takes less than 2 seconds.  Psychiatric: She has a normal mood and affect. Her behavior is normal. Judgment and thought content normal.  Vitals reviewed.    Results for orders placed or performed during the hospital encounter of 12/30/17  CBC with Differential/Platelet  Result Value Ref Range   WBC 7.2 4.0 - 10.5 K/uL   RBC 5.42 (H) 3.87 - 5.11 MIL/uL   Hemoglobin 16.1 (H) 12.0 - 15.0 g/dL   HCT 49.5 (H) 36.0 - 46.0 %   MCV 91.3 80.0 - 100.0 fL   MCH 29.7 26.0 - 34.0 pg   MCHC 32.5 30.0 - 36.0 g/dL   RDW 13.3 11.5 - 15.5 %   Platelets 283 150 - 400 K/uL   nRBC 0.0 0.0 - 0.2 %   Neutrophils Relative % 51 %   Neutro Abs 3.7 1.7 - 7.7 K/uL   Lymphocytes Relative 33 %   Lymphs Abs 2.4 0.7 - 4.0 K/uL   Monocytes Relative 9 %   Monocytes Absolute 0.7 0.1 - 1.0 K/uL   Eosinophils Relative 6 %   Eosinophils Absolute 0.4 0.0 - 0.5 K/uL   Basophils Relative 1 %   Basophils Absolute 0.1 0.0 - 0.1 K/uL   Immature Granulocytes 0 %   Abs Immature Granulocytes 0.02 0.00 - 0.07 K/uL  Lipase, blood  Result Value Ref Range   Lipase 31 11 - 51 U/L  Comprehensive metabolic panel  Result Value Ref Range   Sodium 138 135 - 145 mmol/L   Potassium 3.4 (L) 3.5 - 5.1 mmol/L   Chloride 100 98 - 111 mmol/L   CO2 29 22 - 32 mmol/L   Glucose, Bld 154 (H) 70 - 99 mg/dL   BUN 13 8 - 23 mg/dL   Creatinine, Ser 0.81 0.44 - 1.00 mg/dL   Calcium 9.3 8.9 - 10.3 mg/dL   Total Protein 7.1 6.5 - 8.1 g/dL   Albumin 4.2 3.5 - 5.0 g/dL   AST 17 15 - 41 U/L   ALT 15 0 - 44 U/L   Alkaline  Phosphatase 103 38 - 126 U/L   Total Bilirubin 1.1 0.3 - 1.2 mg/dL   GFR calc  non Af Amer >60 >60 mL/min   GFR calc Af Amer >60 >60 mL/min   Anion gap 9 5 - 15  Troponin I - ONCE - STAT  Result Value Ref Range   Troponin I <0.03 <0.03 ng/mL  Magnesium  Result Value Ref Range   Magnesium 2.0 1.7 - 2.4 mg/dL  Urinalysis, Routine w reflex microscopic  Result Value Ref Range   Color, Urine YELLOW (A) YELLOW   APPearance CLEAR (A) CLEAR   Specific Gravity, Urine 1.013 1.005 - 1.030   pH 6.0 5.0 - 8.0   Glucose, UA NEGATIVE NEGATIVE mg/dL   Hgb urine dipstick NEGATIVE NEGATIVE   Bilirubin Urine NEGATIVE NEGATIVE   Ketones, ur NEGATIVE NEGATIVE mg/dL   Protein, ur NEGATIVE NEGATIVE mg/dL   Nitrite NEGATIVE NEGATIVE   Leukocytes, UA NEGATIVE NEGATIVE      Assessment & Plan:   Problem List Items Addressed This Visit      Cardiovascular and Mediastinum   Benign essential HTN (Chronic)    Controlled hypertension.  BP at goal of <130/80 today.  Pt is continuing healthy lifestyle work and has successfully quit smoking  Plan: 1. Continue taking lisinopril 10 mg once daily, diltiazem 24hr tab 120 mg once daily, carvedilol 3.125 bid - refilled to Dr. Alveria Apley appointment 02/04/2018 2. Encouraged heart healthy diet and increasing exercise to 30 minutes most days of the week. 3. Check BP 1-2 x per week at home, keep log, and bring to clinic at next appointment. 4. Follow up 3 months.       Relevant Medications   carvedilol (COREG) 3.125 MG tablet   lisinopril (PRINIVIL,ZESTRIL) 10 MG tablet   diltiazem (CARDIZEM CD) 120 MG 24 hr capsule     Endocrine   Adult hypothyroidism - Primary (Chronic)    Stable symptoms currently.  Previously over replaced with levothyroxine on last lab check.  Taking 100 mcg levothyroxine daily.  Plan: 1. Recheck TSH 4 weeks.  Adjust dose levothyroxine prn. 2. Follow-up 3 months.      Relevant Medications   levothyroxine (SYNTHROID, LEVOTHROID) 100  MCG tablet   carvedilol (COREG) 3.125 MG tablet   Other Relevant Orders   TSH     Other   Anxiety disorder (Chronic)    Pt with chronic anxiety and is currently partially controlled. Seems consistent with prior examination today, but per patient report, afternoon clonazepam is not controlling symptoms.   Plan: 1.  Continue citalopram to 40 mg once daily - Continue buspirone 7.5 mg tid 2. Clonazepam: Continue 1 mg in am, 0.5 mg in afternoon, and 1 mg at bedtime.   - Encouraged patient to consider reducing 1/2 dose about 2-3 times per week after Christmas. - Discussed tolerance building tendencies  3.  Encouraged patient to walk for 5-10 daily. 4.  Encouraged patient to continue family and social support for stress management.  Encouraged also use of spiritual support system. 5.  Followup 3 months      Relevant Medications   citalopram (CELEXA) 40 MG tablet   clonazePAM (KLONOPIN) 1 MG tablet (Start on 01/31/2018)   busPIRone (BUSPAR) 7.5 MG tablet   Clinical depression (Chronic)    Stable symptoms and PHQ. See AP anxiety above.      Relevant Medications   citalopram (CELEXA) 40 MG tablet   busPIRone (BUSPAR) 7.5 MG tablet      Meds ordered this encounter  Medications  . levothyroxine (SYNTHROID, LEVOTHROID) 100 MCG tablet    Sig: Take 1 tablet (100  mcg total) by mouth daily with breakfast.    Dispense:  30 tablet    Refill:  0  . carvedilol (COREG) 3.125 MG tablet    Sig: Take 1 tablet (3.125 mg total) by mouth 2 (two) times daily with a meal.    Dispense:  60 tablet    Refill:  0    Order Specific Question:   Supervising Provider    Answer:   Olin Hauser [[id number]]  . lisinopril (PRINIVIL,ZESTRIL) 10 MG tablet    Sig: Take 1 tablet (10 mg total) by mouth daily.    Dispense:  30 tablet    Refill:  0    Order Specific Question:   Supervising Provider    Answer:   Olin Hauser [[id number]]  . diltiazem (CARDIZEM CD) 120 MG 24 hr capsule    Sig: Take 1  capsule (120 mg total) by mouth daily.    Dispense:  30 capsule    Refill:  0    Order Specific Question:   Supervising Provider    Answer:   Olin Hauser [[id number]]  . citalopram (CELEXA) 40 MG tablet    Sig: Take 1 tablet (40 mg total) by mouth daily.    Dispense:  90 tablet    Refill:  1    Order Specific Question:   Supervising Provider    Answer:   Olin Hauser [[id number]]  . clonazePAM (KLONOPIN) 1 MG tablet    Sig: TAKE ONE TABLET BY MOUTH EVERY MORNING, ONE-HALF TABLET EVERY AFTERNOON & ONE TABLET AT BEDTIME    Dispense:  75 tablet    Refill:  2    Order Specific Question:   Supervising Provider    Answer:   Olin Hauser [[id number]]  . busPIRone (BUSPAR) 7.5 MG tablet    Sig: Take 1 tablet (7.5 mg total) by mouth 3 (three) times daily.    Dispense:  60 tablet    Refill:  5    Order Specific Question:   Supervising Provider    Answer:   Olin Hauser [[id number]]    Follow up plan: Return in about 3 months (around 04/07/2018) for anxiety.  Cassell Smiles, DNP, AGPCNP-BC Adult Gerontology Primary Care Nurse Practitioner [facility] 01/06/2018, 9:38 AM

## 2018-01-06 NOTE — Patient Instructions (Addendum)
Jill Maes "[name]",   Thank you for coming in to clinic today.  1. Continue medications without changes today.  2. RECHECK your thyroid lab in about  1 month - make an appointment so you get your reminder phone call.  3. Consider reducing your Klonopin by 1/2 tablet 2-3 times per week after holidays.   Please schedule a follow-up appointment with Cassell Smiles, AGNP. Return in about 3 months (around 04/07/2018) for anxiety.  If you have any other questions or concerns, please feel free to call the clinic or send a message through [place]. You may also schedule an earlier appointment if necessary.  You will receive a survey after today's visit either digitally by e-mail or paper by [employer]. Your experiences and feedback matter to [name].  Please respond so we know how we are doing as we provide care for you.   Cassell Smiles, DNP, AGNP-BC Adult Gerontology Nurse Practitioner [place]

## 2018-01-06 NOTE — Assessment & Plan Note (Signed)
Controlled hypertension.  BP at goal of <130/80 today.  Pt is continuing healthy lifestyle work and has successfully quit smoking  Plan: 1. Continue taking lisinopril 10 mg once daily, diltiazem 24hr tab 120 mg once daily, carvedilol 3.125 bid - refilled to Dr. Alveria Apley appointment 02/04/2018 2. Encouraged heart healthy diet and increasing exercise to 30 minutes most days of the week. 3. Check BP 1-2 x per week at home, keep log, and bring to clinic at next appointment. 4. Follow up 3 months.

## 2018-01-06 NOTE — Assessment & Plan Note (Signed)
Pt with chronic anxiety and is currently partially controlled. Seems consistent with prior examination today, but per patient report, afternoon clonazepam is not controlling symptoms.   Plan: 1.  Continue citalopram to 40 mg once daily - Continue buspirone 7.5 mg tid 2. Clonazepam: Continue 1 mg in am, 0.5 mg in afternoon, and 1 mg at bedtime.   - Encouraged patient to consider reducing 1/2 dose about 2-3 times per week after Christmas. - Discussed tolerance building tendencies  3.  Encouraged patient to walk for 5-10 daily. 4.  Encouraged patient to continue family and social support for stress management.  Encouraged also use of spiritual support system. 5.  Followup 3 months

## 2018-01-06 NOTE — Assessment & Plan Note (Signed)
Stable symptoms and PHQ. See AP anxiety above.

## 2018-02-03 ENCOUNTER — Other Ambulatory Visit: Payer: Self-pay

## 2018-02-03 NOTE — Patient Outreach (Signed)
[name]) Care Management  02/03/2018  [name] January 29, 1951 [id number]     67 year old with history of atrial fibrillation, tobacco abuse, COPD, HTN, thyroid disease. History of kyphoplasty around 11/28/17 at [facility] in [place]. Then client was admitted on 11/30/17 to [facility] due to respiratory depression - per chart Multifactorial (side effects of pain medications and other sedative, ARF, COPD and possible  OSA.) and discharged 12-03-2017.   Per chart, client presented to the ED on 12-30-2017 with complaint of nausea/vomiting/headache and concern for dehydration. Per ED note, could be due to client stopping pain medication "cold [place]".   2nd unsuccessful attempt by [place]. Called member at [employer] preferred number; however, unable to leave HIPPA Compliant voicemail message. Attempted to call member's secondary number and unable to leave HIPPA compliant voicemail message.   Unsuccessful Outreach Letter sent. Will follow up in 3-4 business days.  Benjamine Mola "ANN" [name], RN-BSN  [facility] Care Management  Community Care Management Coordinator  [phone] [email or web address]

## 2018-02-06 ENCOUNTER — Other Ambulatory Visit: Payer: Self-pay

## 2018-02-06 DIAGNOSIS — I10 Essential (primary) hypertension: Secondary | ICD-10-CM

## 2018-02-06 DIAGNOSIS — E039 Hypothyroidism, unspecified: Secondary | ICD-10-CM

## 2018-02-06 MED ORDER — LEVOTHYROXINE SODIUM 100 MCG PO TABS: 100.0000 ug | ORAL_TABLET | Freq: Every day | ORAL | 0 refills | Status: DC

## 2018-02-06 MED ORDER — DILTIAZEM HCL ER COATED BEADS 120 MG PO CP24: 120.0000 mg | ORAL_CAPSULE | Freq: Every day | ORAL | 1 refills | Status: DC

## 2018-02-06 MED ORDER — CARVEDILOL 3.125 MG PO TABS: 3.1250 mg | ORAL_TABLET | Freq: Two times a day (BID) | ORAL | 1 refills | Status: DC

## 2018-02-07 ENCOUNTER — Other Ambulatory Visit: Payer: Self-pay

## 2018-02-07 NOTE — Patient Outreach (Addendum)
[facility]) Care Management  02/07/2018  [name] 06/10/1950 [id number]   68 year old with history of atrial fibrillation, tobacco abuse, COPD, HTN, thyroid disease. History ofkyphoplasty around 10/24/19at [facility] in [place] client was admitted on 11/30/17 to [facility] due to respiratory depression - per chart Multifactorial (side effects of pain medications and other sedative, ARF, COPD and possible OSA.) and discharged 12-03-2017.   Per chart, client presented to the ED on 12-30-2017 with complaint of nausea/vomiting/headache and concern for dehydration. Per ED note, could be due to client stopping pain medication "cold [place]".   3rd unsuccessful attempt by [place]. Called member at [employer] preferred number; however, unable to leave HIPPA Compliant voicemail message. Attempted to call member's secondary number and unable to leave HIPPA compliant voicemail message.   Will close at this time due to inability to contact member. Will notify member's primary care physician.  Benjamine Mola "ANN" [name], RN-BSN  [facility] Care Management  Community Care Management Coordinator  [phone] [email or web address]

## 2018-02-07 NOTE — Patient Outreach (Signed)
[facility]) Care Management  02/07/2018  LAKEISA HENINGER 05-18-1950 [id number]    Case Closure Letter sent to member.  Benjamine Mola "ANN" [name], RN-BSN  [facility] Care Management  Community Care Management Coordinator  [phone] [email or web address]

## 2018-02-10 ENCOUNTER — Other Ambulatory Visit: Payer: Self-pay

## 2018-03-12 ENCOUNTER — Other Ambulatory Visit: Payer: Self-pay

## 2018-03-12 DIAGNOSIS — E039 Hypothyroidism, unspecified: Secondary | ICD-10-CM

## 2018-03-12 MED ORDER — LEVOTHYROXINE SODIUM 100 MCG PO TABS: 100.0000 ug | ORAL_TABLET | Freq: Every day | ORAL | 2 refills | Status: DC

## 2018-03-31 ENCOUNTER — Other Ambulatory Visit: Payer: Self-pay

## 2018-04-03 ENCOUNTER — Other Ambulatory Visit: Payer: PPO

## 2018-04-03 DIAGNOSIS — E039 Hypothyroidism, unspecified: Secondary | ICD-10-CM | POA: Diagnosis not present

## 2018-04-04 LAB — TSH: TSH: 6.55 mIU/L — ABNORMAL HIGH (ref 0.40–4.50)

## 2018-04-07 ENCOUNTER — Ambulatory Visit (INDEPENDENT_AMBULATORY_CARE_PROVIDER_SITE_OTHER): Payer: PPO | Admitting: Nurse Practitioner

## 2018-04-07 ENCOUNTER — Encounter: Payer: Self-pay | Admitting: Nurse Practitioner

## 2018-04-07 ENCOUNTER — Other Ambulatory Visit: Payer: Self-pay

## 2018-04-07 VITALS — BP 113/59 | HR 83 | Temp 98.0°F | Resp 20 | Ht 63.0 in | Wt 173.4 lb

## 2018-04-07 DIAGNOSIS — I10 Essential (primary) hypertension: Secondary | ICD-10-CM | POA: Diagnosis not present

## 2018-04-07 DIAGNOSIS — E039 Hypothyroidism, unspecified: Secondary | ICD-10-CM | POA: Diagnosis not present

## 2018-04-07 DIAGNOSIS — J449 Chronic obstructive pulmonary disease, unspecified: Secondary | ICD-10-CM

## 2018-04-07 DIAGNOSIS — F410 Panic disorder [episodic paroxysmal anxiety] without agoraphobia: Secondary | ICD-10-CM

## 2018-04-07 DIAGNOSIS — F3341 Major depressive disorder, recurrent, in partial remission: Secondary | ICD-10-CM | POA: Diagnosis not present

## 2018-04-07 MED ORDER — LISINOPRIL 10 MG PO TABS: 10.0000 mg | ORAL_TABLET | Freq: Every day | ORAL | 5 refills | Status: DC

## 2018-04-07 MED ORDER — CLONAZEPAM 1 MG PO TABS: ORAL_TABLET | ORAL | 2 refills | Status: DC

## 2018-04-07 MED ORDER — LEVOTHYROXINE SODIUM 112 MCG PO CAPS: 112.0000 ug | ORAL_CAPSULE | Freq: Every day | ORAL | 2 refills | Status: DC

## 2018-04-07 MED ORDER — UMECLIDINIUM-VILANTEROL 62.5-25 MCG/INH IN AEPB: 1.0000 | INHALATION_SPRAY | Freq: Every day | RESPIRATORY_TRACT | 5 refills | Status: DC

## 2018-04-07 MED ORDER — DILTIAZEM HCL ER COATED BEADS 120 MG PO CP24: 120.0000 mg | ORAL_CAPSULE | Freq: Every day | ORAL | 5 refills | Status: DC

## 2018-04-07 MED ORDER — BUSPIRONE HCL 7.5 MG PO TABS: 7.5000 mg | ORAL_TABLET | Freq: Three times a day (TID) | ORAL | 5 refills | Status: DC

## 2018-04-07 MED ORDER — CITALOPRAM HYDROBROMIDE 40 MG PO TABS: 40.0000 mg | ORAL_TABLET | Freq: Every day | ORAL | 1 refills | Status: DC

## 2018-04-07 MED ORDER — CARVEDILOL 3.125 MG PO TABS: 3.1250 mg | ORAL_TABLET | Freq: Two times a day (BID) | ORAL | 5 refills | Status: DC

## 2018-04-07 NOTE — Patient Instructions (Addendum)
[name],   Thank you for coming in to clinic today.  1. Continue to take 1 mg in am 0.5 mg in afternoon, and 1 mg at bedtime 5 days per week.  Try reducing morning dose to 0.5 mg two days per week.  GOAL is to eventually take 0.5 tab every morning and every afternoon with 1 mg at bedtime.  2. STOP Spiriva- not preferred for your insurance this year. - START Anoro 1 puff daily.    Please schedule a follow-up appointment with Cassell Smiles, AGNP. Return in about 3 months (around 07/08/2018) for anxiety, thyroid AND thyroid labs in 6 weeks.  If you have any other questions or concerns, please feel free to call the clinic or send a message through [place]. You may also schedule an earlier appointment if necessary.  You will receive a survey after today's visit either digitally by e-mail or paper by [employer]. Your experiences and feedback matter to [name].  Please respond so we know how we are doing as we provide care for you.   Cassell Smiles, DNP, AGNP-BC Adult Gerontology Nurse Practitioner [place]

## 2018-04-07 NOTE — Progress Notes (Signed)
Subjective:    Patient ID: [name], female    DOB: [birth year], 68 y.o.   MRN: [id number]  [name] is a 68 y.o. female presenting on 04/07/2018 for Anxiety   HPI Anxiety Patient continues to feels high anxiety, but states she is coping fairly well and dealing with anxiety okay.  Continue to take clonazepam 1 mg in am 0.5 mg in afternoon, and 1 mg at bedtime.  We have previously discussed reducing doses, but patient feels she cannot reduce at this time.  Addition of buspirone has helped, but patient is fearful of reducing more.  Also continues regular citalopram 40 mg once daily with good response and no known side effects.  GAD 7 : Generalized Anxiety Score 04/07/2018 01/06/2018 06/26/2017 05/14/2017  Nervous, Anxious, on Edge 3 3 3 3   Control/stop worrying 3 0 1 2  Worry too much - different things 2 1 3 3   Trouble relaxing 1 0 1 2  Restless 1 1 1  0  Easily annoyed or irritable 0 0 0 0  Afraid - awful might happen 0 0 0 0  Total GAD 7 Score 10 5 9 10   Anxiety Difficulty Not difficult at all Not difficult at all Somewhat difficult Not difficult at all    Depression screen [facility] 2/9 04/07/2018 01/06/2018 12/05/2017 06/26/2017 05/14/2017  Decreased Interest 0 0 0 0 2  Down, Depressed, Hopeless 1 1 1 1 2   PHQ - 2 Score 1 1 1 1 4   Altered sleeping - 2 - 0 3  Tired, decreased energy 0 0 - 1 2  Change in appetite 0 0 - 0 0  Feeling bad or failure about yourself  0 0 - 0 0  Trouble concentrating 0 0 - 0 0  Moving slowly or fidgety/restless 0 0 - 0 0  Suicidal thoughts 0 0 - 0 0  PHQ-9 Score - 3 - 2 9  Difficult doing work/chores Not difficult at all Not difficult at all - Not difficult at all Not difficult at all  Some recent data might be hidden     Hypertension - She is not checking BP at home or outside of clinic.    - Current medications: tolerating well without side effects - She is not currently symptomatic. - Pt denies headache, lightheadedness, dizziness, changes in vision, chest  tightness/pressure, palpitations, leg swelling, sudden loss of speech or loss of consciousness.   COPD Patient is having little use of albuterol to date. Notes her Spiriva is expensive this year and wants to get her med changed.  Social History   Tobacco Use  . Smoking status: Current Every Day Smoker    Packs/day: 1.00    Years: 35.00    Pack years: 35.00    Types: Cigarettes  . Smokeless tobacco: Never Used  . Tobacco comment: Previously smoked 2 ppd  Substance Use Topics  . Alcohol use: No  . Drug use: No    Review of Systems Per HPI unless specifically indicated above     Objective:    BP (!) 113/59 (BP Location: Right Arm, Patient Position: Sitting, Cuff Size: Normal)   Pulse 83   Temp 98 F (36.7 C) (Oral)   Resp 20   Ht 5\' 3"  (1.6 m)   Wt 173 lb 6.4 oz (78.7 kg)   SpO2 93%   BMI 30.72 kg/m   Wt Readings from Last 3 Encounters:  04/07/18 173 lb 6.4 oz (78.7 kg)  01/06/18 174 lb (78.9  kg)  12/30/17 177 lb 11.1 oz (80.6 kg)    Physical Exam Vitals signs reviewed.  Constitutional:      General: She is not in acute distress.    Appearance: She is well-developed.  HENT:     Head: Normocephalic and atraumatic.  Cardiovascular:     Rate and Rhythm: Normal rate and regular rhythm.     Pulses:          Radial pulses are 2+ on the right side and 2+ on the left side.       Posterior tibial pulses are 1+ on the right side and 1+ on the left side.     Heart sounds: Normal heart sounds, S1 normal and S2 normal.  Pulmonary:     Effort: Pulmonary effort is normal. No respiratory distress.     Breath sounds: Normal air entry. Wheezing and rhonchi present.  Musculoskeletal:     Right lower leg: No edema.     Left lower leg: No edema.  Skin:    General: Skin is warm and dry.     Capillary Refill: Capillary refill takes less than 2 seconds.  Neurological:     General: No focal deficit present.     Mental Status: She is alert and oriented to person, place, and time.  Mental status is at baseline.  Psychiatric:        Attention and Perception: Attention normal.        Mood and Affect: Affect normal. Mood is anxious.        Speech: Speech normal.        Behavior: Behavior normal. Behavior is cooperative.        Thought Content: Thought content normal. Thought content does not include homicidal or suicidal ideation. Thought content does not include homicidal or suicidal plan.        Cognition and Memory: Cognition and memory normal.        Judgment: Judgment normal.    Results for orders placed or performed in visit on 01/06/18  TSH  Result Value Ref Range   TSH 6.55 (H) 0.40 - 4.50 mIU/L      Assessment & Plan:   Problem List Items Addressed This Visit      Cardiovascular and Mediastinum   Benign essential HTN (Chronic) Controlled hypertension.  BP goal < 130/80.  Pt is working on lifestyle modifications.  Taking medications tolerating well without side effects. No current complications.  Plan: 1. Continue taking medications without changes 2. Obtain labs due  3. Encouraged heart healthy diet and increasing exercise to 30 minutes most days of the week. 4. Check BP 1-2 x per week at home, keep log, and bring to clinic at next appointment. 5. Follow up 3 months.     Relevant Medications   lisinopril (PRINIVIL,ZESTRIL) 10 MG tablet   diltiazem (CARDIZEM CD) 120 MG 24 hr capsule   carvedilol (COREG) 3.125 MG tablet     Respiratory   COPD (chronic obstructive pulmonary disease) with chronic bronchitis (HCC) (Chronic) Stable today on exam.  Medications tolerated without side effects.  STOP Spiriva.  START Anoro one puff daily.  Refills provided.  Followup 3 months.    Relevant Medications   umeclidinium-vilanterol (ANORO ELLIPTA) 62.5-25 MCG/INH AEPB     Endocrine   Adult hypothyroidism - Primary (Chronic) Stable today on exam.  Medications tolerated without side effects.  Continue at current doses.  Refills provided.  Check labs today.  Followup 6 months.    Relevant Medications  Levothyroxine Sodium 112 MCG CAPS   carvedilol (COREG) 3.125 MG tablet     Other   Anxiety disorder (Chronic)   Relevant Medications   clonazePAM (KLONOPIN) 1 MG tablet (Start on 04/25/2018)   busPIRone (BUSPAR) 7.5 MG tablet   citalopram (CELEXA) 40 MG tablet   Other Relevant Orders   TSH + free T4   Clinical depression (Chronic)   Relevant Medications   busPIRone (BUSPAR) 7.5 MG tablet   citalopram (CELEXA) 40 MG tablet      Anxiety and depression stable.  Patient continues having symptoms, but has good coping skills.    Continue meds without changes.  Encouraged patient to reduce clonazepam to 0.5 mg in am and afternoon at least 2 days per week until the next visit.  Continue 1 mg at bedtime.  Goal is to eventually reduce to TDD 2 mg daily.  Patient verbalizes understanding.  Reduce pill # per 30 days at next visit. - Follow-up 3 months.   Meds ordered this encounter  Medications  . Levothyroxine Sodium 112 MCG CAPS    Sig: Take 1 capsule (112 mcg total) by mouth daily before breakfast.    Dispense:  30 capsule    Refill:  2    Order Specific Question:   Supervising Provider    Answer:   Olin Hauser [[id number]]  . clonazePAM (KLONOPIN) 1 MG tablet    Sig: TAKE ONE TABLET BY MOUTH EVERY MORNING, ONE-HALF TABLET EVERY AFTERNOON & ONE TABLET AT BEDTIME    Dispense:  75 tablet    Refill:  2    Order Specific Question:   Supervising Provider    Answer:   Olin Hauser [[id number]]  . umeclidinium-vilanterol (ANORO ELLIPTA) 62.5-25 MCG/INH AEPB    Sig: Inhale 1 puff into the lungs daily.    Dispense:  30 each    Refill:  5    Order Specific Question:   Supervising Provider    Answer:   Olin Hauser [[id number]]  . busPIRone (BUSPAR) 7.5 MG tablet    Sig: Take 1 tablet (7.5 mg total) by mouth 3 (three) times daily.    Dispense:  60 tablet    Refill:  5    Order Specific Question:   Supervising Provider     Answer:   Olin Hauser [[id number]]  . citalopram (CELEXA) 40 MG tablet    Sig: Take 1 tablet (40 mg total) by mouth daily.    Dispense:  90 tablet    Refill:  1    Order Specific Question:   Supervising Provider    Answer:   Olin Hauser [[id number]]  . lisinopril (PRINIVIL,ZESTRIL) 10 MG tablet    Sig: Take 1 tablet (10 mg total) by mouth daily.    Dispense:  30 tablet    Refill:  5    Order Specific Question:   Supervising Provider    Answer:   Olin Hauser [[id number]]  . diltiazem (CARDIZEM CD) 120 MG 24 hr capsule    Sig: Take 1 capsule (120 mg total) by mouth daily.    Dispense:  30 capsule    Refill:  5    Order Specific Question:   Supervising Provider    Answer:   Olin Hauser [[id number]]  . carvedilol (COREG) 3.125 MG tablet    Sig: Take 1 tablet (3.125 mg total) by mouth 2 (two) times daily with a meal.    Dispense:  60 tablet    Refill:  5    Order Specific Question:   Supervising Provider    Answer:   Olin Hauser [[id number]]    Follow up plan: Return in about 3 months (around 07/08/2018) for anxiety, thyroid.  Cassell Smiles, DNP, AGPCNP-BC Adult Gerontology Primary Care Nurse Practitioner [facility] 04/07/2018, 11:27 AM

## 2018-04-10 ENCOUNTER — Encounter: Payer: Self-pay | Admitting: Nurse Practitioner

## 2018-04-14 DIAGNOSIS — L400 Psoriasis vulgaris: Secondary | ICD-10-CM | POA: Diagnosis not present

## 2018-04-14 DIAGNOSIS — Z79899 Other long term (current) drug therapy: Secondary | ICD-10-CM | POA: Diagnosis not present

## 2018-04-21 ENCOUNTER — Other Ambulatory Visit: Payer: Self-pay | Admitting: Nurse Practitioner

## 2018-05-12 ENCOUNTER — Ambulatory Visit: Payer: Self-pay | Admitting: *Deleted

## 2018-05-12 DIAGNOSIS — J4489 Other specified chronic obstructive pulmonary disease: Secondary | ICD-10-CM

## 2018-05-12 DIAGNOSIS — J449 Chronic obstructive pulmonary disease, unspecified: Secondary | ICD-10-CM

## 2018-05-12 DIAGNOSIS — I10 Essential (primary) hypertension: Secondary | ICD-10-CM

## 2018-05-12 NOTE — Chronic Care Management (AMB) (Signed)
  Chronic Care Management   Telephone Outreach Note  05/12/2018 Name: [name] MRN: [id number] DOB: [birth year]  Referred by: patient's health plan.   I reached out to [name] today by phone in response to a referral sent by [name]'s health plan. [name] and I briefly discussed care management needs related to COPD  [name] was given information about Chronic Care Management services today including:  1. CCM service includes personalized support from designated clinical staff supervised by her physician, including individualized plan of care and coordination with other care providers 2. 24/7 contact phone numbers for assistance for urgent and routine care needs. 3. Service will only be billed when office clinical staff spend 20 minutes or more in a month to coordinate care. 4. Only one practitioner may furnish and bill the service in a calendar month. 5. The patient may stop CCM services at any time (effective at the end of the month) by phone call to the office staff. 6. The patient will be responsible for cost sharing (co-pay) of up to 20% of the service fee (after annual deductible is met).  Patient did not agree to services and wishes to consider information provided before deciding about enrollment in CCM services.   The CM team will reach out to the patient again over the next 7 days.    [employer], [name] has been notified of this outreach and [name]'s decision and plan.   Jill Morse Kamesha Herne RN, BSN Nurse Case Pharmacist, community Medical [facility] Care Management  [phone]) [employer]

## 2018-05-16 ENCOUNTER — Encounter: Payer: Self-pay | Admitting: Nurse Practitioner

## 2018-05-16 ENCOUNTER — Other Ambulatory Visit: Payer: Self-pay

## 2018-05-16 ENCOUNTER — Ambulatory Visit (INDEPENDENT_AMBULATORY_CARE_PROVIDER_SITE_OTHER): Payer: PPO | Admitting: Nurse Practitioner

## 2018-05-16 DIAGNOSIS — J020 Streptococcal pharyngitis: Secondary | ICD-10-CM

## 2018-05-16 MED ORDER — AMOXICILLIN-POT CLAVULANATE 875-125 MG PO TABS: 1.0000 | ORAL_TABLET | Freq: Two times a day (BID) | ORAL | 0 refills | Status: AC

## 2018-05-16 NOTE — Progress Notes (Signed)
Telemedicine Encounter: Disclosed to patient at start of encounter that we will provide appropriate telemedicine services.  Patient consents to be treated via phone prior to discussion. - Patient is at her home and is accessed via telephone. - Services are provided by Cassell Smiles from [facility].  Subjective:    Patient ID: [name], female    DOB: [birth year], 68 y.o.   MRN: [id number]  [name] is a 68 y.o. female presenting on 05/16/2018 for Sore Throat (difficulty swallowing w/ white patches, chills, headache, neck pain and fever 101. )  HPI Sore Throat Notes has had strep throat several years ago.  Started having symptoms yesterday that include swollen neck glands, white patches in back of her throat, chills, fever 101, headache, mild neck soreness with swollen lymph nodes.  Swallowing liquids and solids causes sore, painful throat.  Occasionally feels like she is going to choke on her liquid or pills.  Is avoiding solid foods at this time.   - Patient has no worsening cough (normal cough with COPD is not worsening). - Patient reports no increased trouble breathing and has been self-isolated except for people bringing her supplies.  Social History   Tobacco Use  . Smoking status: Current Every Day Smoker    Packs/day: 0.50    Years: 35.00    Pack years: 17.50    Types: Cigarettes  . Smokeless tobacco: Never Used  . Tobacco comment: Previously smoked 2 ppd  Substance Use Topics  . Alcohol use: No  . Drug use: No   Review of Systems Per HPI unless specifically indicated above    Objective:    There were no vitals taken for this visit.  Wt Readings from Last 3 Encounters:  04/07/18 173 lb 6.4 oz (78.7 kg)  01/06/18 174 lb (78.9 kg)  12/30/17 177 lb 11.1 oz (80.6 kg)    Physical Exam Patient remotely monitored.  Verbal communication appropriate.  Cognition normal.   Results for orders placed or performed in visit on 01/06/18  TSH  Result Value  Ref Range   TSH 6.55 (H) 0.40 - 4.50 mIU/L      Assessment & Plan:   Problem List Items Addressed This Visit    None    Visit Diagnoses    Strep pharyngitis    -  Primary   Relevant Medications   amoxicillin-clavulanate (AUGMENTIN) 875-125 MG tablet    Suspected acute strep throat infection based on history and exam. Not able to perform strep testing virtually.  No known strep or sick contacts.  Past strep infection > 1 year ago.  No other localized infection, TMs clear. Able to tolerate PO. Centor Score 3 per patient report.  Patient without any active changes in breathing that would be suggestive of Covid-19.  Plan: 1. Start antibiotics with Augmentin 875-125 mg BID x 10 days 2. Symptomatic control with NSAID / Tylenol regularly then PRN 3. Improve hydration, advance diet, warm herbal tea with honey 4. Follow-up within 1 week if worsening 5. Discussed precautions for COVID-19 including seeking care if respiratory status worsens.  Meds ordered this encounter  Medications  . amoxicillin-clavulanate (AUGMENTIN) 875-125 MG tablet    Sig: Take 1 tablet by mouth 2 (two) times daily for 10 days.    Dispense:  20 tablet    Refill:  0    Order Specific Question:   Supervising Provider    Answer:   Olin Hauser [[id number]]   - Time spent  in direct consultation with patient via telemedicine about above concerns: 6 minutes  Follow up plan: PRN in 5-7 days if symptoms do not improve.    Cassell Smiles, DNP, AGPCNP-BC Adult Gerontology Primary Care Nurse Practitioner [facility] 05/16/2018, 8:22 AM

## 2018-06-24 ENCOUNTER — Encounter: Payer: Self-pay | Admitting: *Deleted

## 2018-06-25 NOTE — Progress Notes (Signed)
This encounter was created in error - please disregard.

## 2018-07-05 ENCOUNTER — Other Ambulatory Visit: Payer: Self-pay | Admitting: Nurse Practitioner

## 2018-07-05 DIAGNOSIS — E039 Hypothyroidism, unspecified: Secondary | ICD-10-CM

## 2018-07-17 ENCOUNTER — Telehealth: Payer: Self-pay | Admitting: Nurse Practitioner

## 2018-07-17 DIAGNOSIS — F410 Panic disorder [episodic paroxysmal anxiety] without agoraphobia: Secondary | ICD-10-CM

## 2018-07-17 MED ORDER — CLONAZEPAM 1 MG PO TABS: ORAL_TABLET | ORAL | 2 refills | Status: DC

## 2018-07-17 NOTE — Telephone Encounter (Signed)
Pt called requesting refill on clonazepam [place]

## 2018-08-02 ENCOUNTER — Other Ambulatory Visit: Payer: Self-pay | Admitting: Nurse Practitioner

## 2018-08-02 DIAGNOSIS — J42 Unspecified chronic bronchitis: Secondary | ICD-10-CM

## 2018-08-26 ENCOUNTER — Telehealth: Payer: Self-pay | Admitting: Nurse Practitioner

## 2018-08-26 NOTE — Telephone Encounter (Signed)
Called to schedule Medicare Annual Wellness Visit with Nurse Health Advisor.   If patient returns call, please schedule AWV with NHA any day  For any questions please contact:  Jill Alexanders [phone] or skype at: [email or web address]

## 2018-09-17 DIAGNOSIS — Z79899 Other long term (current) drug therapy: Secondary | ICD-10-CM | POA: Diagnosis not present

## 2018-09-17 DIAGNOSIS — L400 Psoriasis vulgaris: Secondary | ICD-10-CM | POA: Diagnosis not present

## 2018-10-03 ENCOUNTER — Other Ambulatory Visit: Payer: Self-pay | Admitting: Nurse Practitioner

## 2018-10-03 DIAGNOSIS — E039 Hypothyroidism, unspecified: Secondary | ICD-10-CM

## 2018-10-03 DIAGNOSIS — F410 Panic disorder [episodic paroxysmal anxiety] without agoraphobia: Secondary | ICD-10-CM

## 2018-10-03 NOTE — Telephone Encounter (Signed)
Renee at [facility] called requesting refill on      clonazePam Levothyroxine Kenal[place]

## 2018-10-04 ENCOUNTER — Other Ambulatory Visit: Payer: Self-pay | Admitting: Nurse Practitioner

## 2018-10-04 ENCOUNTER — Other Ambulatory Visit: Payer: Self-pay | Admitting: Family Medicine

## 2018-10-04 DIAGNOSIS — E039 Hypothyroidism, unspecified: Secondary | ICD-10-CM

## 2018-10-04 DIAGNOSIS — I10 Essential (primary) hypertension: Secondary | ICD-10-CM

## 2018-10-06 ENCOUNTER — Other Ambulatory Visit: Payer: Self-pay

## 2018-10-06 DIAGNOSIS — E039 Hypothyroidism, unspecified: Secondary | ICD-10-CM

## 2018-10-06 DIAGNOSIS — F410 Panic disorder [episodic paroxysmal anxiety] without agoraphobia: Secondary | ICD-10-CM

## 2018-10-06 DIAGNOSIS — I10 Essential (primary) hypertension: Secondary | ICD-10-CM

## 2018-10-06 MED ORDER — LEVOTHYROXINE SODIUM 112 MCG PO TABS: 112.0000 ug | ORAL_TABLET | Freq: Every day | ORAL | 0 refills | Status: DC

## 2018-10-08 ENCOUNTER — Other Ambulatory Visit: Payer: Self-pay | Admitting: Family Medicine

## 2018-10-08 ENCOUNTER — Telehealth: Payer: Self-pay | Admitting: Nurse Practitioner

## 2018-10-08 DIAGNOSIS — F410 Panic disorder [episodic paroxysmal anxiety] without agoraphobia: Secondary | ICD-10-CM

## 2018-10-08 NOTE — Telephone Encounter (Signed)
Attempted to contact the pt, no answer or voicemail.

## 2018-10-08 NOTE — Telephone Encounter (Signed)
Pt called requesting refill on clonazepam.

## 2018-10-08 NOTE — Telephone Encounter (Signed)
For clonzepam refill, patient needs a visit at least once every 6 months.  Refill will continue to be refused until she schedules one.  Virtual is okay.

## 2018-10-10 ENCOUNTER — Ambulatory Visit (INDEPENDENT_AMBULATORY_CARE_PROVIDER_SITE_OTHER): Payer: PPO | Admitting: Nurse Practitioner

## 2018-10-10 ENCOUNTER — Other Ambulatory Visit: Payer: Self-pay

## 2018-10-10 ENCOUNTER — Encounter: Payer: Self-pay | Admitting: Nurse Practitioner

## 2018-10-10 VITALS — BP 130/70 | HR 87 | Wt 180.0 lb

## 2018-10-10 DIAGNOSIS — I10 Essential (primary) hypertension: Secondary | ICD-10-CM | POA: Diagnosis not present

## 2018-10-10 DIAGNOSIS — E039 Hypothyroidism, unspecified: Secondary | ICD-10-CM

## 2018-10-10 DIAGNOSIS — F410 Panic disorder [episodic paroxysmal anxiety] without agoraphobia: Secondary | ICD-10-CM

## 2018-10-10 MED ORDER — CLONAZEPAM 1 MG PO TABS: ORAL_TABLET | ORAL | 2 refills | Status: DC

## 2018-10-10 NOTE — Progress Notes (Signed)
Telemedicine Encounter: Disclosed to patient at start of encounter that we will provide appropriate telemedicine services.  Patient consents to be treated via phone prior to discussion. - Patient is at her home and is accessed via telephone. - Services are provided by Cassell Smiles from [facility].  Subjective:    Patient ID: [name], female    DOB: 05-27-1950, 68 y.o.   MRN: [id number]   [name] is a 68 y.o. female presenting on 10/10/2018 for Follow-up (Patient requesting refill on clonazepam. Patient reports good tolerance and compliance with medication.)   HPI Anxiety Patient admits anxiety is generally well controlled, but is hHaving difficulty with being alone and home all the time. Occasionally visits with her son, but less than usual prior to Covid-19. Patient currently takes buspirone 7.5 mg tid, celexa 40 mg daily, and clonazepam. - Patient takes clonazepam 1mg  in am, 1/2 tab (0.5 mg) in afternoon, 1 mg in pm.  - Non-pharm management continues, but is not helping control any more than usual.  - Patient denies any current physical symptoms of anxiety.  Former cigarette smoker: - Patient is now 6 months tobacco free.  March 14th.  Now breathing better.  No tobacco/nicotine withdrawals were noted.    Depression screen [facility] 2/9 10/10/2018 05/16/2018 04/07/2018 01/06/2018 12/05/2017  Decreased Interest 1 1 0 0 0  Down, Depressed, Hopeless 1 0 1 1 1   PHQ - 2 Score 2 1 1 1 1   Altered sleeping 0 0 - 2 -  Tired, decreased energy 1 0 0 0 -  Change in appetite 0 0 0 0 -  Feeling bad or failure about yourself  0 0 0 0 -  Trouble concentrating 0 0 0 0 -  Moving slowly or fidgety/restless 0 0 0 0 -  Suicidal thoughts 0 0 0 0 -  PHQ-9 Score 3 1 - 3 -  Difficult doing work/chores Not difficult at all Not difficult at all Not difficult at all Not difficult at all -  Some recent data might be hidden   GAD 7 : Generalized Anxiety Score 10/10/2018 04/07/2018 01/06/2018  06/26/2017  Nervous, Anxious, on Edge 3 3 3 3   Control/stop worrying 2 3 0 1  Worry too much - different things 2 2 1 3   Trouble relaxing 1 1 0 1  Restless 0 1 1 1   Easily annoyed or irritable 0 0 0 0  Afraid - awful might happen 0 0 0 0  Total GAD 7 Score 8 10 5 9   Anxiety Difficulty Not difficult at all Not difficult at all Not difficult at all Somewhat difficult   Social History   Tobacco Use  . Smoking status: Former Smoker    Packs/day: 0.50    Years: 35.00    Pack years: 17.50    Types: Cigarettes    Start date: 04/19/2018  . Smokeless tobacco: Never Used  . Tobacco comment: Previously smoked 2 ppd  Substance Use Topics  . Alcohol use: No  . Drug use: No    Review of Systems Per HPI unless specifically indicated above     Objective:    BP 130/70 (BP Location: Left Arm, Patient Position: Sitting, Cuff Size: Normal)   Pulse 87   Wt 180 lb (81.6 kg)   BMI 31.89 kg/m   Wt Readings from Last 3 Encounters:  10/10/18 180 lb (81.6 kg)  04/07/18 173 lb 6.4 oz (78.7 kg)  01/06/18 174 lb (78.9 kg)  Physical Exam Patient remotely monitored.  Verbal communication appropriate.  Cognition normal.   Results for orders placed or performed in visit on 01/06/18  TSH  Result Value Ref Range   TSH 6.55 (H) 0.40 - 4.50 mIU/L      Assessment & Plan:   Problem List Items Addressed This Visit      Cardiovascular and Mediastinum   Benign essential HTN - Primary (Chronic) Stable BP reading reported.  Needs labs for thyroid - also check CMP for hypertension.  - Continue regular medication dosing with diltiazem 120 mg daily (24 hr tab). - FOLLOW-UP 3 months.   Relevant Orders   Comprehensive metabolic panel     Endocrine   Adult hypothyroidism (Chronic) Status unknown.  Recheck labs.  Continue meds without changes today.  Refills provided. Followup 6 months and prn after labs.    Relevant Orders   TSH   T4, free     Other   Anxiety disorder (Chronic) Generally Stable  today on exam. Worsened some by social isolation.  Medications tolerated without side effects.  Continue at current doses.  Refills provided. Reviewed controlled substance contract verbally with patient.  Needs to continue trying to reduce dose of clonazepam overall. Continue to utilize non-pharm management options. Followup 3 months.    Relevant Medications   clonazePAM (KLONOPIN) 1 MG tablet      Meds ordered this encounter  Medications  . clonazePAM (KLONOPIN) 1 MG tablet    Sig: TAKE ONE TABLET BY MOUTH EVERY MORNING, ONE-HALF TABLET EVERY AFTERNOON & ONE TABLET AT BEDTIME    Dispense:  75 tablet    Refill:  2    Order Specific Question:   Supervising Provider    Answer:   Olin Hauser [[id number]]    - Time spent in direct consultation with patient via telemedicine about above concerns: 9 minutes  Follow up plan: Return in about 3 months (around 01/09/2019) for anxiety, thyroid.  Cassell Smiles, DNP, AGPCNP-BC Adult Gerontology Primary Care Nurse Practitioner [facility] 10/10/2018, 3:15 PM

## 2018-10-15 ENCOUNTER — Other Ambulatory Visit: Payer: Self-pay | Admitting: Nurse Practitioner

## 2018-10-15 DIAGNOSIS — L400 Psoriasis vulgaris: Secondary | ICD-10-CM | POA: Diagnosis not present

## 2018-10-15 DIAGNOSIS — I10 Essential (primary) hypertension: Secondary | ICD-10-CM

## 2018-10-15 MED ORDER — CARVEDILOL 3.125 MG PO TABS: 3.1250 mg | ORAL_TABLET | Freq: Two times a day (BID) | ORAL | 1 refills | Status: DC

## 2018-10-17 ENCOUNTER — Encounter: Payer: Self-pay | Admitting: Nurse Practitioner

## 2018-11-02 ENCOUNTER — Other Ambulatory Visit: Payer: Self-pay | Admitting: Nurse Practitioner

## 2018-11-02 DIAGNOSIS — I10 Essential (primary) hypertension: Secondary | ICD-10-CM

## 2018-11-04 ENCOUNTER — Other Ambulatory Visit: Payer: Self-pay

## 2018-11-04 DIAGNOSIS — E039 Hypothyroidism, unspecified: Secondary | ICD-10-CM

## 2018-11-05 MED ORDER — LEVOTHYROXINE SODIUM 112 MCG PO TABS: 112.0000 ug | ORAL_TABLET | Freq: Every day | ORAL | 0 refills | Status: DC

## 2018-11-16 ENCOUNTER — Other Ambulatory Visit: Payer: Self-pay | Admitting: Nurse Practitioner

## 2018-11-16 DIAGNOSIS — F410 Panic disorder [episodic paroxysmal anxiety] without agoraphobia: Secondary | ICD-10-CM

## 2018-11-18 ENCOUNTER — Telehealth: Payer: Self-pay

## 2018-11-18 NOTE — Telephone Encounter (Signed)
Fax refill request came over for the patient Buspar 7.5MG . The prescription was filled electronic.

## 2018-11-26 ENCOUNTER — Other Ambulatory Visit: Payer: Self-pay | Admitting: Nurse Practitioner

## 2018-11-26 DIAGNOSIS — J449 Chronic obstructive pulmonary disease, unspecified: Secondary | ICD-10-CM

## 2018-12-05 ENCOUNTER — Other Ambulatory Visit: Payer: Self-pay | Admitting: Nurse Practitioner

## 2018-12-05 DIAGNOSIS — E039 Hypothyroidism, unspecified: Secondary | ICD-10-CM

## 2018-12-08 ENCOUNTER — Other Ambulatory Visit: Payer: Self-pay

## 2018-12-08 ENCOUNTER — Telehealth: Payer: Self-pay | Admitting: Family Medicine

## 2018-12-08 DIAGNOSIS — E039 Hypothyroidism, unspecified: Secondary | ICD-10-CM

## 2018-12-08 NOTE — Telephone Encounter (Signed)
Lauren's previous rx has said patient needed apt.  I have only given her a 30 day previously.  Her last thyroid test was 03/2018.  Patient needs to schedule an office visit and lab test for thyroid for further refills. I can fill one more 30 day until she can do that.  Nobie Putnam, DO [facility] 12/08/2018, 6:10 PM

## 2018-12-09 NOTE — Telephone Encounter (Signed)
The pt was notified and appointment scheduled for 01/09/19 w/ labs on 12/31/18.

## 2018-12-09 NOTE — Telephone Encounter (Signed)
Tried calling the patient and there was no answer and no voicemail to leave a message. Will try to call back again later today.

## 2018-12-31 ENCOUNTER — Other Ambulatory Visit: Payer: Self-pay

## 2018-12-31 ENCOUNTER — Other Ambulatory Visit: Payer: PPO

## 2018-12-31 DIAGNOSIS — E039 Hypothyroidism, unspecified: Secondary | ICD-10-CM | POA: Diagnosis not present

## 2019-01-02 LAB — HEPATITIS C ANTIBODY
Hepatitis C Ab: NONREACTIVE
SIGNAL TO CUT-OFF: 0.03 (ref <1.00)

## 2019-01-02 LAB — TSH: TSH: 12.42 mIU/L — ABNORMAL HIGH (ref 0.40–4.50)

## 2019-01-02 LAB — T4, FREE: Free T4: 1.2 ng/dL (ref 0.8–1.8)

## 2019-01-04 ENCOUNTER — Other Ambulatory Visit: Payer: Self-pay | Admitting: Nurse Practitioner

## 2019-01-04 DIAGNOSIS — I10 Essential (primary) hypertension: Secondary | ICD-10-CM

## 2019-01-06 ENCOUNTER — Other Ambulatory Visit: Payer: Self-pay | Admitting: Nurse Practitioner

## 2019-01-06 DIAGNOSIS — F410 Panic disorder [episodic paroxysmal anxiety] without agoraphobia: Secondary | ICD-10-CM

## 2019-01-06 DIAGNOSIS — I10 Essential (primary) hypertension: Secondary | ICD-10-CM

## 2019-01-06 DIAGNOSIS — E039 Hypothyroidism, unspecified: Secondary | ICD-10-CM

## 2019-01-06 IMAGING — CT CT L SPINE W/O CM
3 series · 12 of 33 positions shown, 14 images · non-contrast
Comparison: 08/19/2006 lumbar spine MRI

CLINICAL DATA: 67 y/o F; back surgery 11/29/2017. Acute change in
mental status.

EXAM:
CT LUMBAR SPINE WITHOUT CONTRAST
TECHNIQUE: Multidetector CT imaging of the lumbar spine was performed without
intravenous contrast administration. Multiplanar CT image
reconstructions were also generated.

[Series 7: l spine soft · axial · 0.29mm/px · z∈[-650,-480]mm · 4 of 121 slices shown, 5 images]
[im 19/121  soft-tissue]
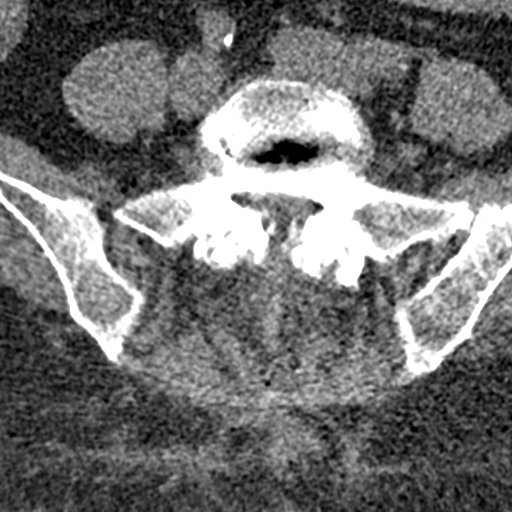
[im 19/121  bone]
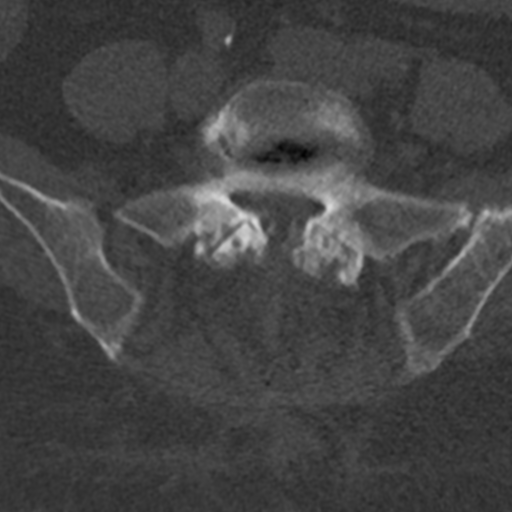
[im 47/121  bone]
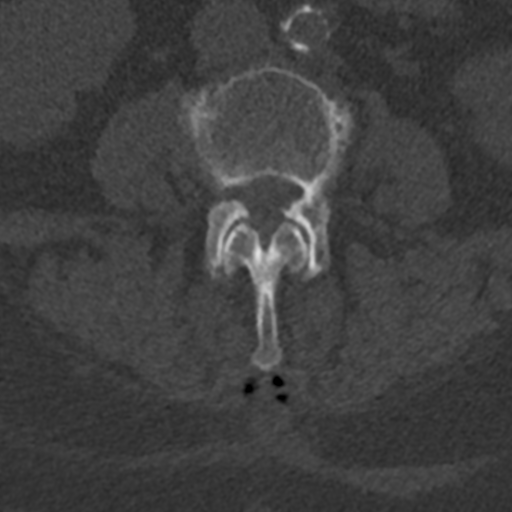
[im 74/121  bone]
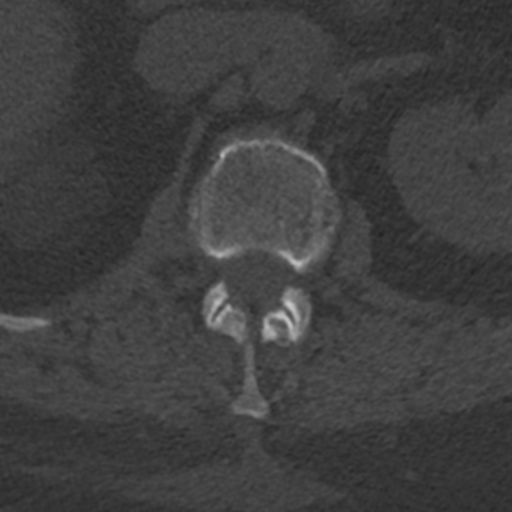
[im 102/121  bone]
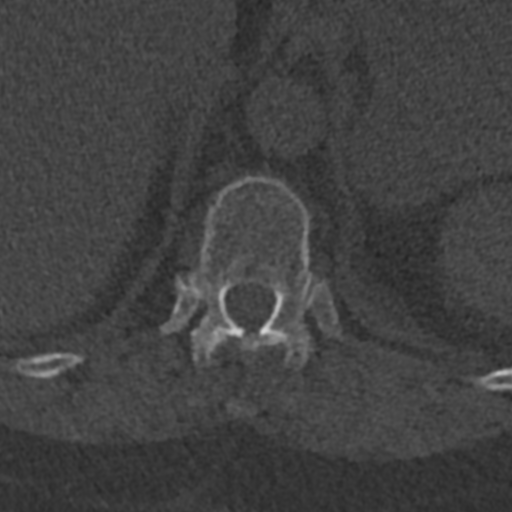

[Series 8: sagittal bone · sagittal · 0.35mm/px · 5 of 73 slices shown, 6 images]
[im 25/73  bone]
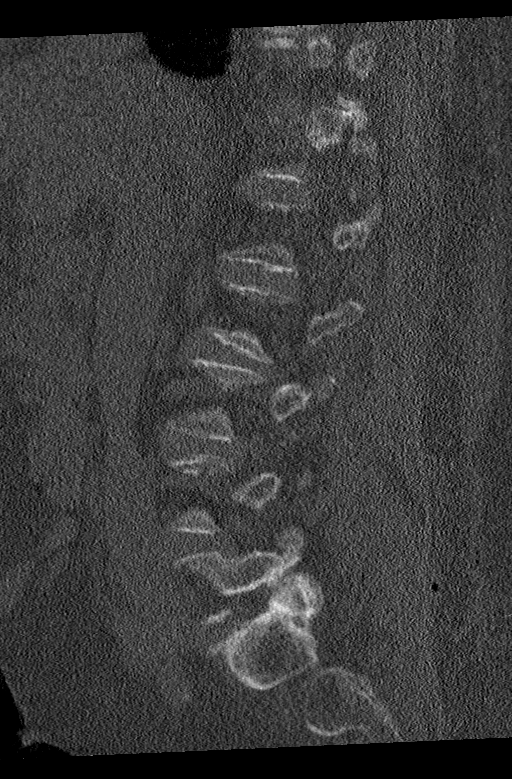
[im 31/73  bone]
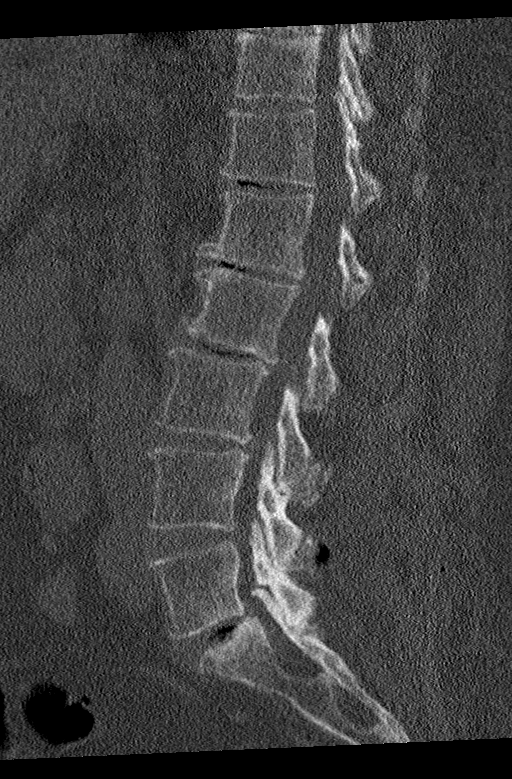
[im 37/73  soft-tissue]
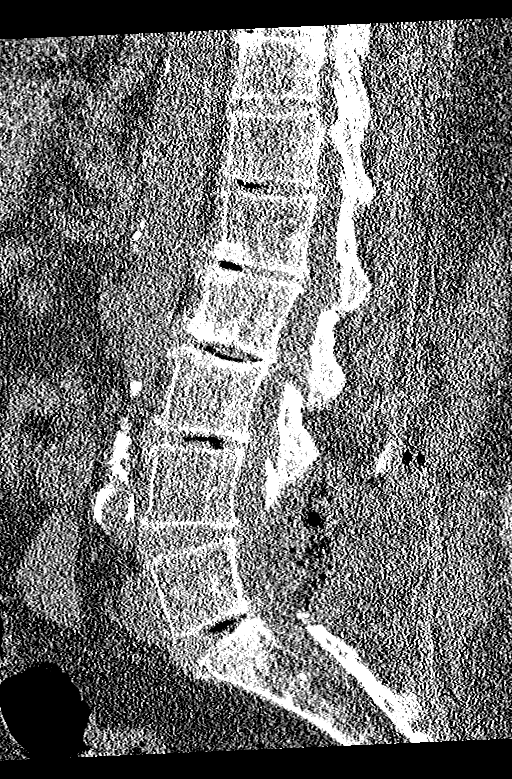
[im 37/73  bone]
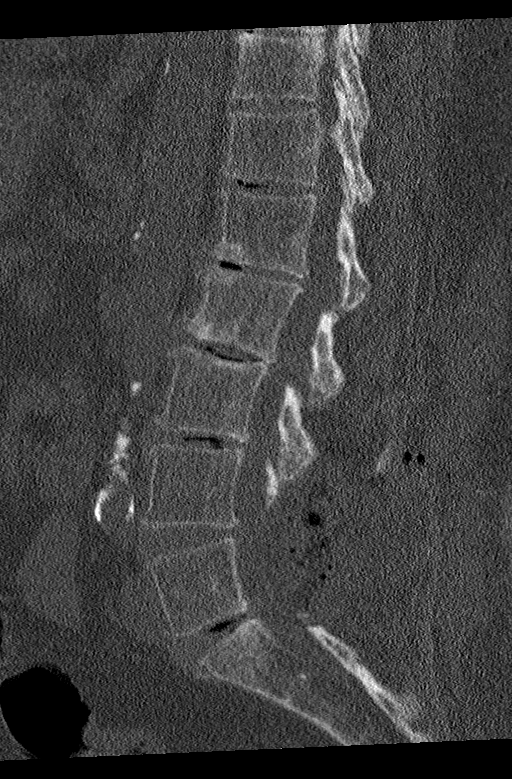
[im 43/73  bone]
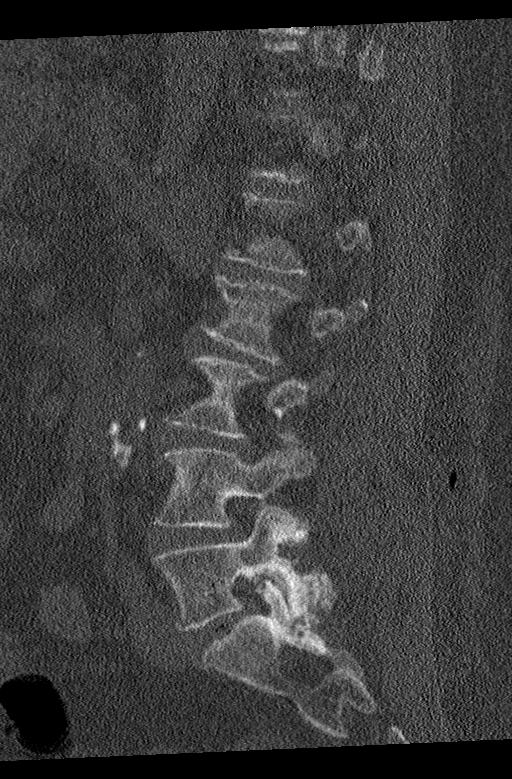
[im 49/73  bone]
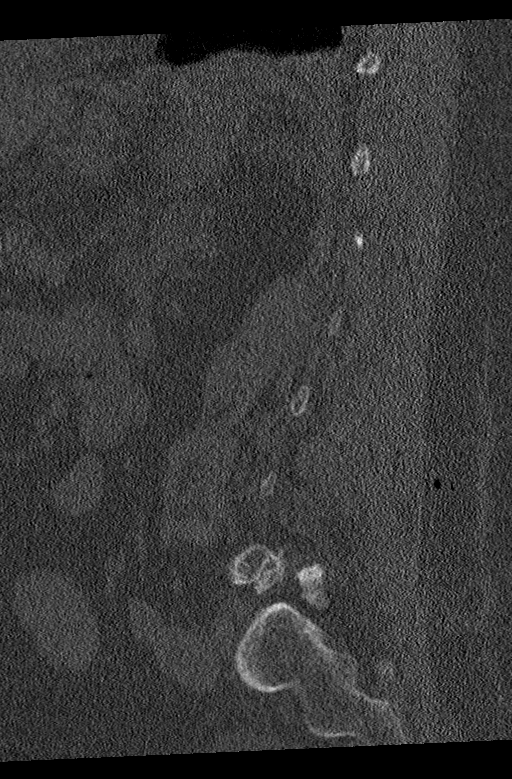

[Series 9: coronal bone · coronal · 0.28mm/px · 3 of 91 slices shown]
[im 19/91  bone]
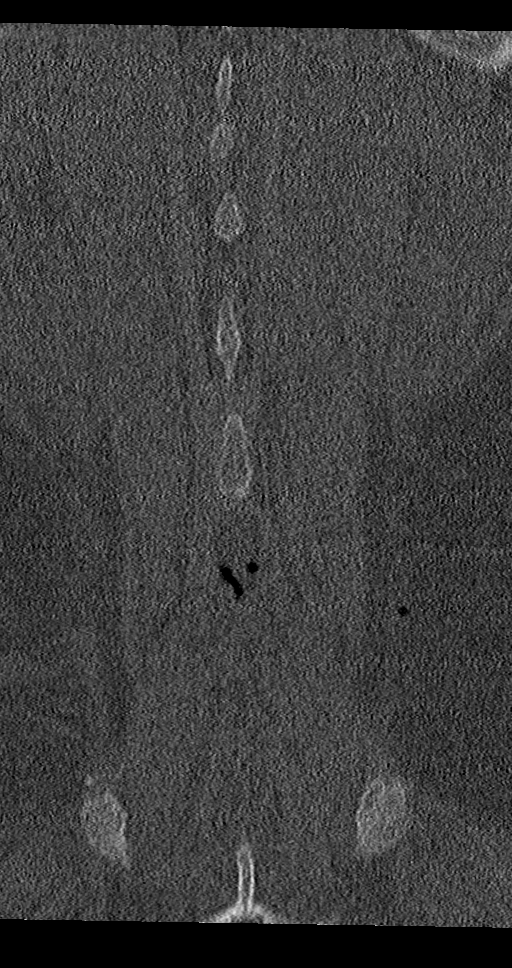
[im 37/91  bone]
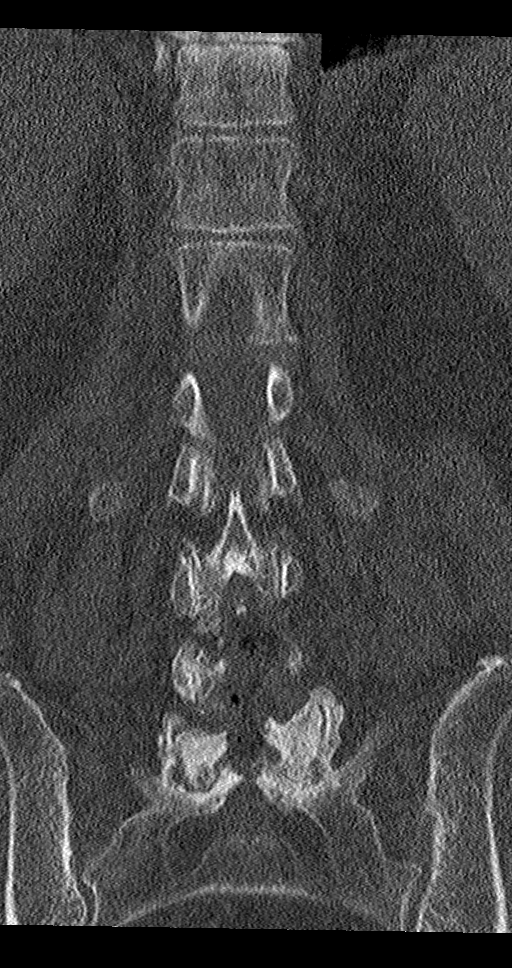
[im 55/91  bone]
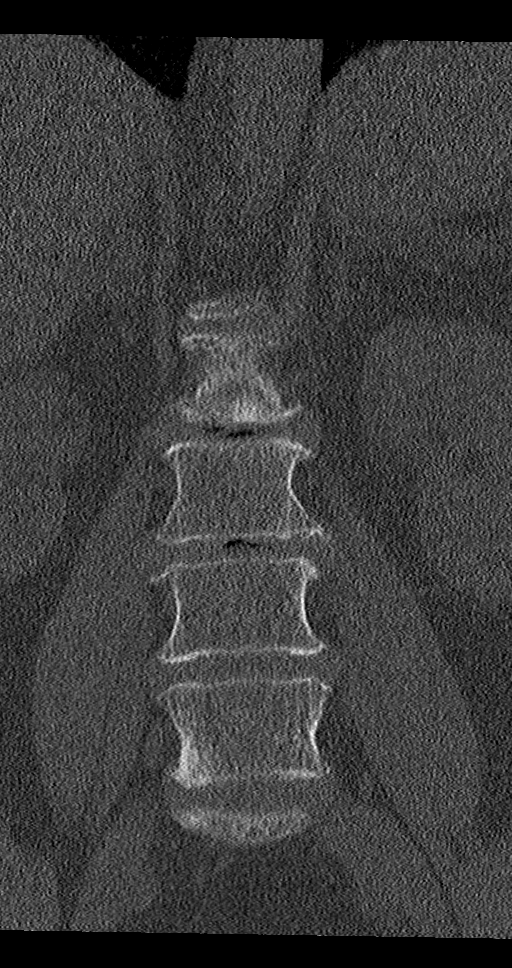

[12 of 33 positions shown; findings below may reference images not displayed]

FINDINGS: Segmentation: 5 lumbar type vertebrae.

Alignment: Normal.

Vertebrae: L4 and L5 vertebral body laminectomy postsurgical
changes. No acute fracture, bony erosive changes, or suspicious
osseous lesion.

Paraspinal and other soft tissues: There is edema with several foci
of air within the subcutaneous fat, paraspinal muscles, and
laminectomy bed at the L4 and L5 levels. Additionally there are a
few punctate foci of air within the dorsal epidural space at L3 and
L4 levels. Abdominal aortic calcific atherosclerosis. Partially
visualized dependent atelectasis of the lungs.

Disc levels: Mild multilevel loss of intervertebral disc space
height T12 through L4 and L5-S1 with vacuum phenomenon. Small
multilevel endplate marginal osteophytes. Moderate facet arthrosis
at L3 through S1.

Disc and facet degenerative changes result in mild bony foraminal
stenosis at the L3 through S1 levels. No high-grade bony canal
stenosis.
IMPRESSION: 1. L4 and L5 vertebral body laminectomy postsurgical changes. No
acute osseous abnormality identified.
2. Postsurgical changes within the L4-5 posterior soft tissues and
laminectomy bed. Infection not excluded, clinical correlation
recommended.

By: Ghanshyam Afolabi M.D.

## 2019-01-06 IMAGING — CT CT HEAD W/O CM
3 series · 15 of 47 positions shown, 18 images · non-contrast
Comparison: Head CT dated 02/02/2013

CLINICAL DATA: 67-year-old female with altered mental status.

EXAM:
CT HEAD WITHOUT CONTRAST
TECHNIQUE: Contiguous axial images were obtained from the base of the skull
through the vertex without intravenous contrast.

[Series 3: head wo · axial · 0.40mm/px · z∈[-130,-5]mm · 9 of 31 slices shown, 12 images]
[im 3/31  brain]
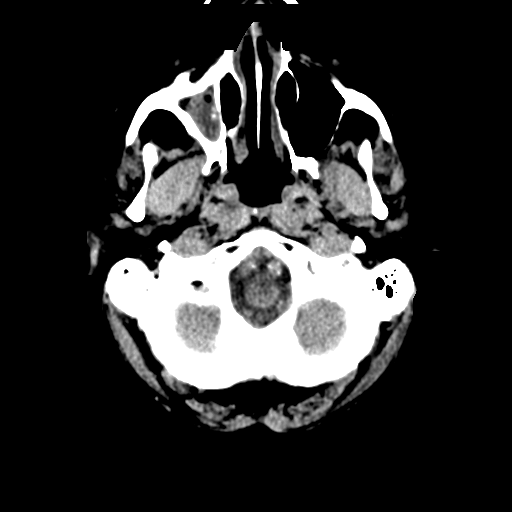
[im 3/31  bone]
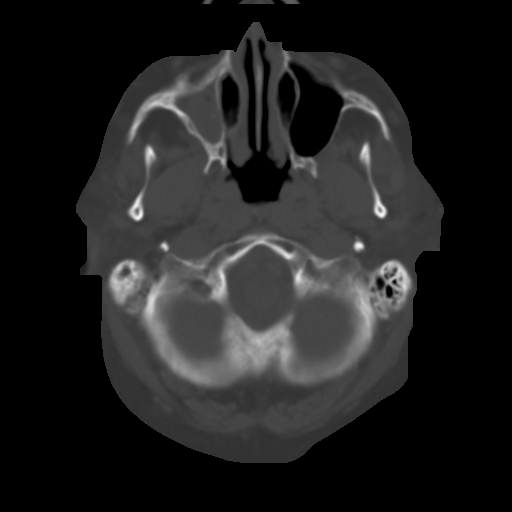
[im 6/31  brain]
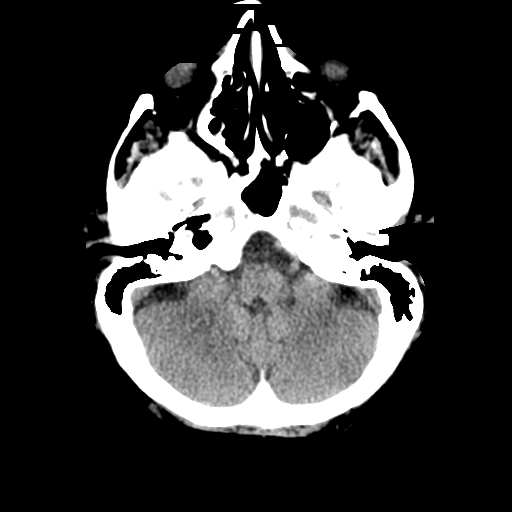
[im 9/31  brain]
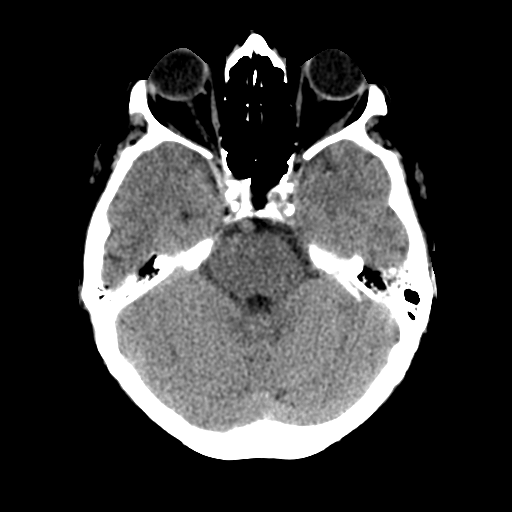
[im 12/31  brain]
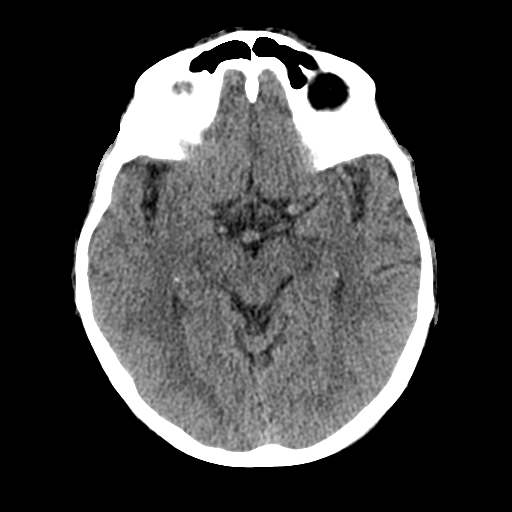
[im 16/31  brain]
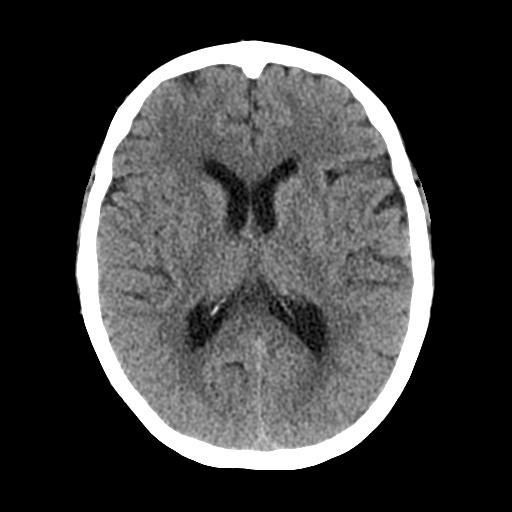
[im 16/31  bone]
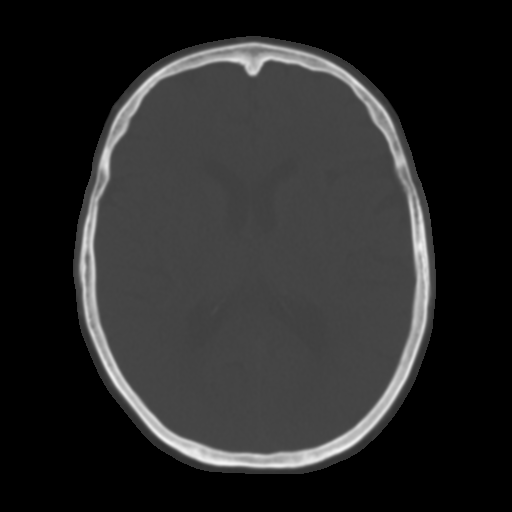
[im 19/31  brain]
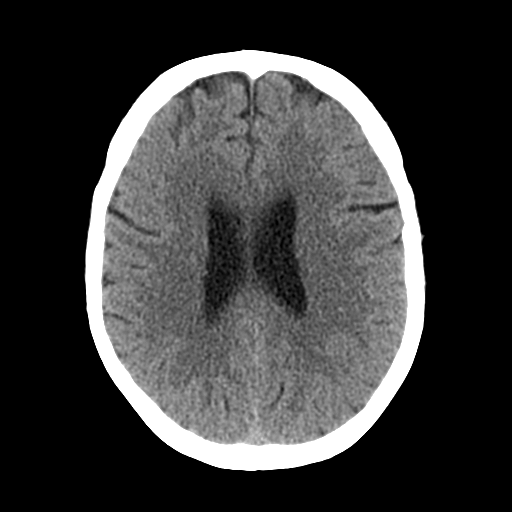
[im 22/31  brain]
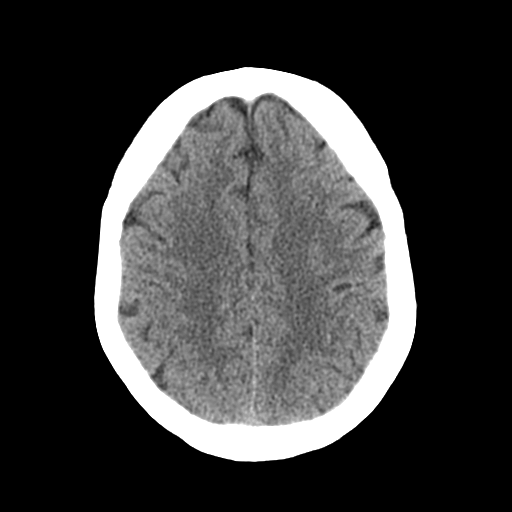
[im 25/31  brain]
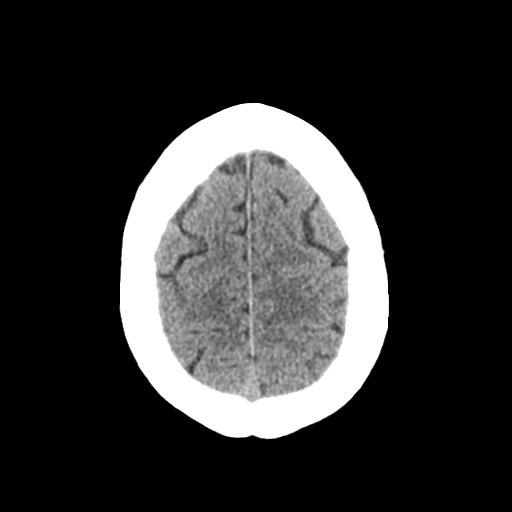
[im 28/31  brain]
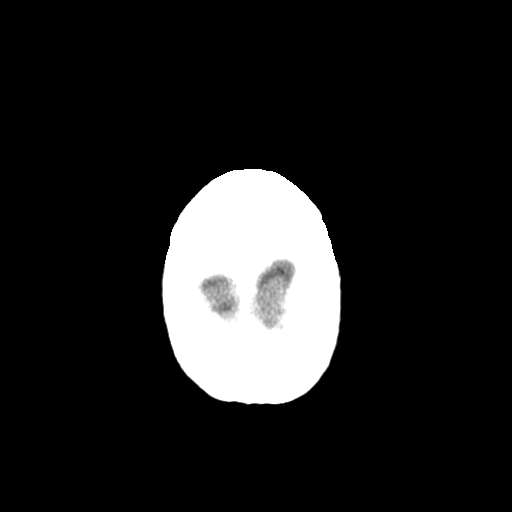
[im 28/31  bone]
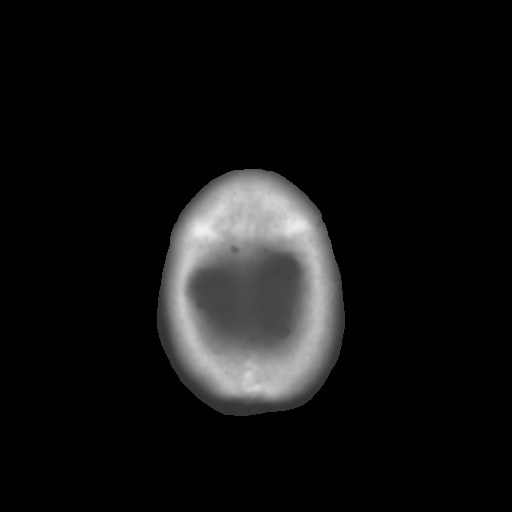

[Series 4: coronal soft tissue · coronal · 0.28mm/px · 3 of 61 slices shown]
[im 21/61  brain]
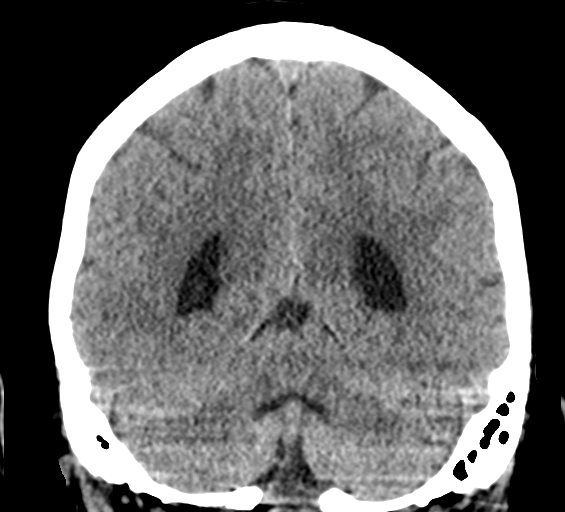
[im 27/61  brain]
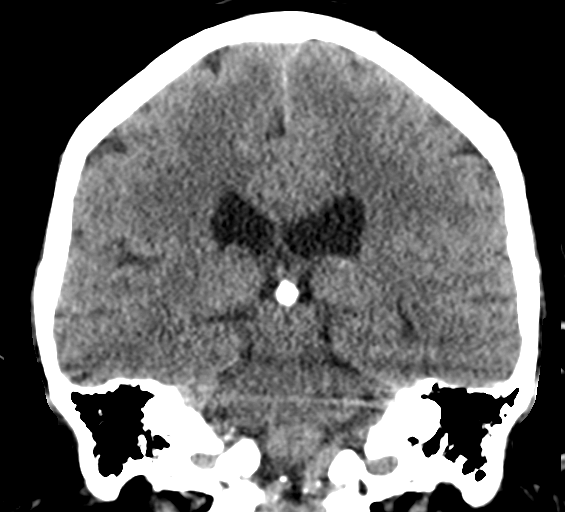
[im 34/61  brain]
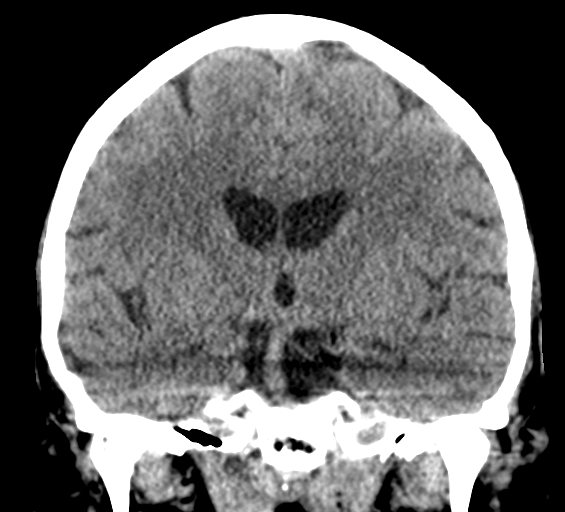

[Series 5: sagittal soft tissue · sagittal · 0.28mm/px · 3 of 54 slices shown]
[im 18/54  brain]
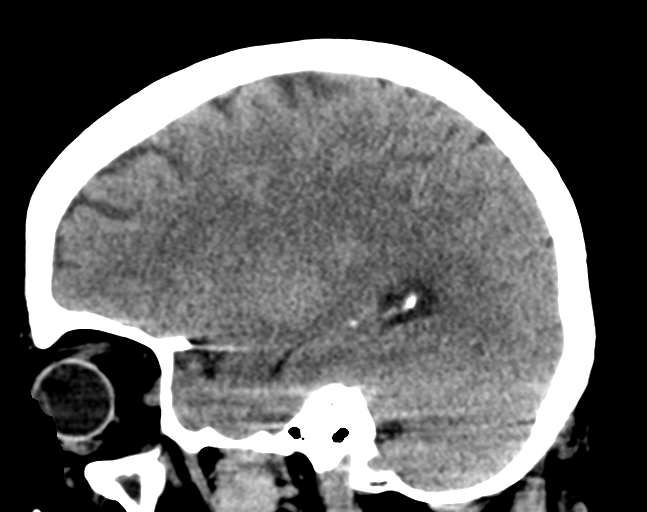
[im 27/54  brain]
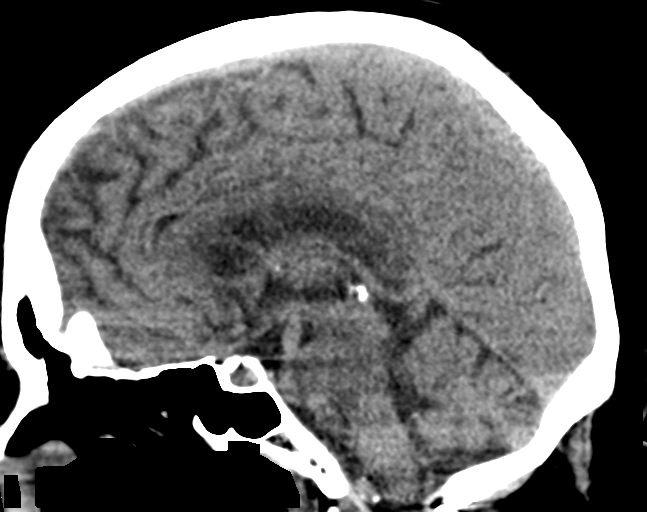
[im 36/54  brain]
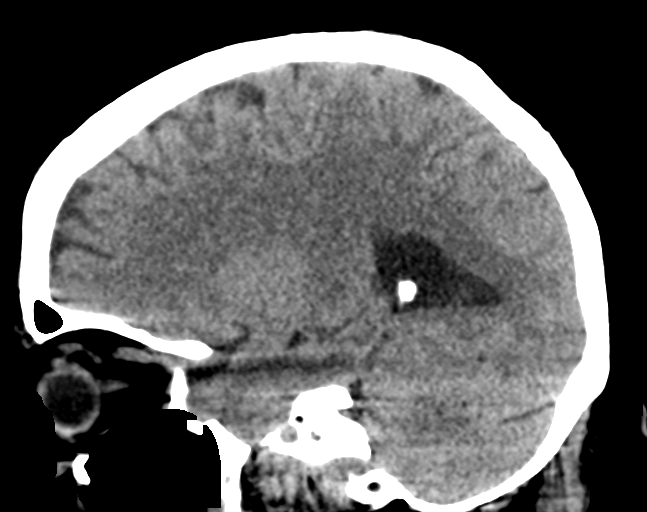

[15 of 47 positions shown; findings below may reference images not displayed]

FINDINGS: Brain: The ventricles and sulci appropriate size for patient's age.4
minimal periventricular and deep white matter chronic microvascular
ischemic changes noted. There is no acute intracranial hemorrhage.
No mass effect or midline shift. No extra-axial fluid collection.

Vascular: No hyperdense vessel or unexpected calcification.

Skull: Normal. Negative for fracture or focal lesion.

Sinuses/Orbits: Chronic right maxillary sinus disease. The remainder
of the visualized paranasal sinuses and mastoid air cells are clear.

Other: None
IMPRESSION: 1. No acute intracranial pathology.
2. Chronic right maxillary sinus disease.

## 2019-01-06 MED ORDER — LEVOTHYROXINE SODIUM 112 MCG PO TABS: 112.0000 ug | ORAL_TABLET | Freq: Every day | ORAL | 0 refills | Status: DC

## 2019-01-06 MED ORDER — CLONAZEPAM 1 MG PO TABS: ORAL_TABLET | ORAL | 2 refills | Status: DC

## 2019-01-06 MED ORDER — LISINOPRIL 10 MG PO TABS: 10.0000 mg | ORAL_TABLET | Freq: Every day | ORAL | 0 refills | Status: DC

## 2019-01-06 NOTE — Telephone Encounter (Signed)
Drug store, Pt  Called said that pt was out of BP Med, Heart  Pill,  ClonazePam, Lisinopril

## 2019-01-09 ENCOUNTER — Other Ambulatory Visit: Payer: Self-pay

## 2019-01-09 ENCOUNTER — Ambulatory Visit (INDEPENDENT_AMBULATORY_CARE_PROVIDER_SITE_OTHER): Payer: PPO | Admitting: Family Medicine

## 2019-01-09 ENCOUNTER — Ambulatory Visit: Payer: PPO | Admitting: Nurse Practitioner

## 2019-01-09 DIAGNOSIS — F410 Panic disorder [episodic paroxysmal anxiety] without agoraphobia: Secondary | ICD-10-CM

## 2019-01-09 DIAGNOSIS — I10 Essential (primary) hypertension: Secondary | ICD-10-CM

## 2019-01-09 DIAGNOSIS — E039 Hypothyroidism, unspecified: Secondary | ICD-10-CM

## 2019-01-09 DIAGNOSIS — J449 Chronic obstructive pulmonary disease, unspecified: Secondary | ICD-10-CM | POA: Diagnosis not present

## 2019-01-09 NOTE — Assessment & Plan Note (Signed)
Controlled Continue current regimen Recently refilled

## 2019-01-09 NOTE — Assessment & Plan Note (Signed)
Last lab TSH increased from 6 to 12, but normal Free T4 Clinically asymptomatic Recently already filled Levothyroxine 120mcg daily   Given normal Free T4, and she has plenty of med - will agree to continue Levothyroxine 148mcg daily, however can repeat thyroid panel within 3-6 months and reconsider dose adjust if still elevated TSH, or if reduced

## 2019-01-09 NOTE — Patient Instructions (Addendum)
Start NEW inhaler - Trelegy - it is very similar to Anoro but it has 3 medicines.  Use 1 puff (inhalation) daily. This should last you 1 month supply with both sample inhalers.  Make sure to rinse mouth out with water or take a drink of water after each use to clean mouth to rinse residual medicine.  Let us know before you run out of this new inhaler if it is working well if you want [name] to order it in early January 2021 to avoid medicare donut hole.  For Mammogram screening for breast cancer   Call the [facility] below anytime to schedule your own appointment now that order has been placed.  [facility] [place], [place] [address] Phone: [phone]   Please schedule a Follow-up Appointment to: Return in about 3 months (around 04/09/2019) for COPD w/ new provider.  If you have any other questions or concerns, please feel free to call the office or send a message through [facility]. You may also schedule an earlier appointment if necessary.  Additionally, you may be receiving a survey about your experience at our office within a few days to 1 week by e-mail or mail. We value your feedback.  Nobie Putnam, DO [facility]

## 2019-01-09 NOTE — Assessment & Plan Note (Signed)
Controlled HTN Followed by Cardiology On Carvedilol, Diltiazem, Lisinopril Continue - recently refilled

## 2019-01-09 NOTE — Progress Notes (Signed)
Virtual Visit via Telephone The purpose of this virtual visit is to provide medical care while limiting exposure to the novel coronavirus (COVID19) for both patient and office staff.  Consent was obtained for phone visit:  Yes.   Answered questions that patient had about telehealth interaction:  Yes.   I discussed the limitations, risks, security and privacy concerns of performing an evaluation and management service by telephone. I also discussed with the patient that there may be a patient responsible charge related to this service. The patient expressed understanding and agreed to proceed.  Patient Location: Home Provider Location: [facility])  ---------------------------------------------------------------------- Chief Complaint  Patient presents with  . Hypertension    S: Reviewed CMA documentation. I have called patient and gathered additional HPI as follows:  Chronic Bronchitis COPD Previously on Anoro with some relief. [place] medicare plan was covering it but now cost is up to $130 believed now due to in [facility] w/ lack of rx coverage for now. - She is currently out of Anoro, requesting re order or alternative med. She admits some increasing winded or short of breath on exertion activity, says anoro was helpful but still has some symptoms. Admits DOE Denies any chest pain, coughing or wheezing, numbness tingling weakness  Hypothyroidism Follow-up last lab results in 12/2018,  TSH inc from 6 to 12 - but normal range Free T4 On 112 daily already picked up, asymptomatic, she would like to keep same dose.  CHRONIC HTN: Reports no new concerns Current Meds - Carvedilol 3.125 BID, Diltiazem 120mg  24 hr, Lisinopril 10mg    Reports good compliance, took meds today. Tolerating well, w/o complaints.  Anxiety Chronic problem with anxiety. Mostly well controlled on medications. Patient currently takes buspirone 7.5 mg tid, celexa 40 mg daily, and  clonazepam. - Patient takes clonazepam 1mg  in am, 1/2 tab (0.5 mg) in afternoon, 1 mg in pm.  - Non-pharm management continues, but is not helping control any more than usual.  - Patient denies any current physical symptoms of anxiety.   Denies any high risk travel to areas of current concern for COVID19. Denies any known or suspected exposure to person with or possibly with COVID19.  Denies any fevers, chills, sweats, body ache, cough, sinus pain or pressure, headache, abdominal pain, diarrhea  Past Medical History:  Diagnosis Date  . Anxiety   . Arthritis   . Chronic back pain   . COPD (chronic obstructive pulmonary disease) ([place])   . Depression   . GERD (gastroesophageal reflux disease)   . Hypertension   . Hypothyroidism   . Pneumonia    RECENT X 3  . PONV (postoperative nausea and vomiting)   . Psoriasis   . Thyroid disease    Social History   Tobacco Use  . Smoking status: Former Smoker    Packs/day: 0.50    Years: 35.00    Pack years: 17.50    Types: Cigarettes    Start date: 04/19/2018  . Smokeless tobacco: Never Used  . Tobacco comment: Previously smoked 2 ppd  Substance Use Topics  . Alcohol use: No  . Drug use: No    Current Outpatient Medications:  .  acetaminophen (TYLENOL) 500 MG tablet, Take 500 mg by mouth every 6 (six) hours as needed for moderate pain. , Disp: , Rfl:  .  busPIRone (BUSPAR) 7.5 MG tablet, TAKE ONE TABLET BY MOUTH THREE TIMES DAILY, Disp: 60 tablet, Rfl: 5 .  carvedilol (COREG) 3.125 MG tablet, Take  1 tablet (3.125 mg total) by mouth 2 (two) times daily with a meal., Disp: 180 tablet, Rfl: 1 .  citalopram (CELEXA) 40 MG tablet, Take 1 tablet (40 mg total) by mouth daily., Disp: 90 tablet, Rfl: 1 .  clonazePAM (KLONOPIN) 1 MG tablet, TAKE ONE TABLET BY MOUTH EVERY MORNING, ONE-HALF TABLET EVERY AFTERNOON & ONE TABLET AT BEDTIME, Disp: 75 tablet, Rfl: 2 .  clotrimazole-betamethasone (LOTRISONE) cream, Apply 1 application topically 2 (two)  times daily., Disp: 30 g, Rfl: 0 .  diltiazem (CARDIZEM CD) 120 MG 24 hr capsule, TAKE ONE CAPSULE BY MOUTH ONCE DAILY, Disp: 90 capsule, Rfl: 1 .  DOK 100 MG capsule, Take 100 mg by mouth 2 (two) times daily., Disp: , Rfl: 0 .  levothyroxine (SYNTHROID) 112 MCG tablet, Take 1 tablet (112 mcg total) by mouth daily before breakfast., Disp: 90 tablet, Rfl: 0 .  lisinopril (ZESTRIL) 10 MG tablet, Take 1 tablet (10 mg total) by mouth daily., Disp: 90 tablet, Rfl: 0 .  Misc. Devices (QUAD CANE/SMALL BASE) MISC, 1 Device by Does not apply route daily., Disp: 1 each, Rfl: 0 .  PROAIR HFA 108 (90 Base) MCG/ACT inhaler, INHALE 2 PUFFS BY MOUTH INTO THE LUNGS EVERY 6 HOURS AS NEEDED FOR WHEEZING OR SHORTNESS OF BREATH, Disp: 25.5 g, Rfl: 1 .  triamcinolone (KENALOG) 0.025 % ointment, Apply 1 application topically as directed. , Disp: , Rfl:  .  Secukinumab (COSENTYX) 150 MG/ML SOSY, Inject 150 mg into the skin every 14 (fourteen) days. 2 injections per month, Disp: , Rfl:  .  TRELEGY ELLIPTA 100-62.5-25 MCG/INH AEPB, Inhale 1 puff into the lungs daily., Disp: 60 each, Rfl: 3 .  TREMFYA 100 MG/ML SOPN, , Disp: , Rfl:   Depression screen [facility] 2/9 01/09/2019 10/10/2018 05/16/2018  Decreased Interest 3 1 1   Down, Depressed, Hopeless 3 1 0  PHQ - 2 Score 6 2 1   Altered sleeping 2 0 0  Tired, decreased energy 3 1 0  Change in appetite 1 0 0  Feeling bad or failure about yourself  0 0 0  Trouble concentrating 0 0 0  Moving slowly or fidgety/restless 0 0 0  Suicidal thoughts 0 0 0  PHQ-9 Score 12 3 1   Difficult doing work/chores Not difficult at all Not difficult at all Not difficult at all  Some recent data might be hidden    GAD 7 : Generalized Anxiety Score 01/09/2019 10/10/2018 04/07/2018 01/06/2018  Nervous, Anxious, on Edge 3 3 3 3   Control/stop worrying 2 2 3  0  Worry too much - different things 3 2 2 1   Trouble relaxing 2 1 1  0  Restless 0 0 1 1  Easily annoyed or irritable 0 0 0 0  Afraid - awful  might happen 0 0 0 0  Total GAD 7 Score 10 8 10 5   Anxiety Difficulty Not difficult at all Not difficult at all Not difficult at all Not difficult at all    -------------------------------------------------------------------------- O: No physical exam performed due to remote telephone encounter.  Lab results reviewed.  Recent Results (from the past 2160 hour(s))  TSH     Status: Abnormal   Collection Time: 12/31/18 10:12 AM  Result Value Ref Range   TSH 12.42 (H) 0.40 - 4.50 mIU/L  T4, free     Status: None   Collection Time: 12/31/18 10:12 AM  Result Value Ref Range   Free T4 1.2 0.8 - 1.8 ng/dL  Hepatitis C antibody  Status: None   Collection Time: 12/31/18 10:12 AM  Result Value Ref Range   Hepatitis C Ab NON-REACTIVE NON-REACTI   SIGNAL TO CUT-OFF 0.03 <1.00    Comment: . HCV antibody was non-reactive. There is no laboratory  evidence of HCV infection. . In most cases, no further action is required. However, if recent HCV exposure is suspected, a test for HCV RNA (test code [phone]) is suggested. . For additional information please refer to http://education.questdiagn[employer].com/faq/FAQ22v1 (This link is being provided for informational/ educational purposes only.) .     -------------------------------------------------------------------------- A&P:  Problem List Items Addressed This Visit    COPD (chronic obstructive pulmonary disease) with chronic bronchitis ([place]) - Primary (Chronic)    Still suboptimal control on Anoro therapy, has DOE Prior inhalers with Spiriva, Symbicort Has required acute treatment of COPD flares, none recently  Plan - Switch to triple therapy option - considered Breztri or Trelegy, her formulary preference Trelegy preferred lowest copay option, will proceed with that, we have x 2 14 day use sample inhalers of Trelegy today - will set those aside for her to pick up at her soonest convenience which will be Monday 12/7, she can pick them up,  discussed benefits use and instructions were written, rinse mouth after use - she can notify us at any point after using Trelegy for 2-3 weeks if it is effective and if she is ready for new rx to continue it      Relevant Medications   TRELEGY ELLIPTA 100-62.5-25 MCG/INH AEPB   Benign essential HTN (Chronic)    Controlled HTN Followed by Cardiology On Carvedilol, Diltiazem, Lisinopril Continue - recently refilled      Anxiety disorder (Chronic)    Controlled Continue current regimen Recently refilled      Adult hypothyroidism (Chronic)    Last lab TSH increased from 6 to 12, but normal Free T4 Clinically asymptomatic Recently already filled Levothyroxine 132mcg daily   Given normal Free T4, and she has plenty of med - will agree to continue Levothyroxine 126mcg daily, however can repeat thyroid panel within 3-6 months and reconsider dose adjust if still elevated TSH, or if reduced           No orders of the defined types were placed in this encounter.   Follow-up: - Return on Monday to pick up inhaler samples Follow-up 3 months  Patient verbalizes understanding with the above medical recommendations including the limitation of remote medical advice.  Specific follow-up and call-back criteria were given for patient to follow-up or seek medical care more urgently if needed.   - Time spent in direct consultation with patient on phone: 15 minutes   Nobie Putnam, [facility] 01/09/2019, 11:09 AM

## 2019-01-09 NOTE — Assessment & Plan Note (Signed)
Still suboptimal control on Anoro therapy, has DOE Prior inhalers with Spiriva, Symbicort Has required acute treatment of COPD flares, none recently  Plan - Switch to triple therapy option - considered Breztri or Trelegy, her formulary preference Trelegy preferred lowest copay option, will proceed with that, we have x 2 14 day use sample inhalers of Trelegy today - will set those aside for her to pick up at her soonest convenience which will be Monday 12/7, she can pick them up, discussed benefits use and instructions were written, rinse mouth after use - she can notify us at any point after using Trelegy for 2-3 weeks if it is effective and if she is ready for new rx to continue it

## 2019-02-13 ENCOUNTER — Telehealth: Payer: Self-pay | Admitting: Nurse Practitioner

## 2019-02-13 NOTE — Telephone Encounter (Signed)
I called the patient to schedule AWV with Tiffany, but there was no answer and no option to leave a message. VDM (DD) 

## 2019-03-31 ENCOUNTER — Telehealth: Payer: Self-pay | Admitting: Nurse Practitioner

## 2019-03-31 DIAGNOSIS — F410 Panic disorder [episodic paroxysmal anxiety] without agoraphobia: Secondary | ICD-10-CM

## 2019-03-31 NOTE — Telephone Encounter (Signed)
Pt called requesting refill on clonazepam

## 2019-04-01 MED ORDER — CLONAZEPAM 1 MG PO TABS: ORAL_TABLET | ORAL | 0 refills | Status: DC

## 2019-04-02 ENCOUNTER — Other Ambulatory Visit: Payer: Self-pay | Admitting: Nurse Practitioner

## 2019-04-02 DIAGNOSIS — F410 Panic disorder [episodic paroxysmal anxiety] without agoraphobia: Secondary | ICD-10-CM

## 2019-04-02 DIAGNOSIS — F3341 Major depressive disorder, recurrent, in partial remission: Secondary | ICD-10-CM

## 2019-04-02 DIAGNOSIS — I10 Essential (primary) hypertension: Secondary | ICD-10-CM

## 2019-04-02 DIAGNOSIS — E039 Hypothyroidism, unspecified: Secondary | ICD-10-CM

## 2019-04-02 MED ORDER — LEVOTHYROXINE SODIUM 112 MCG PO TABS: 112.0000 ug | ORAL_TABLET | Freq: Every day | ORAL | 0 refills | Status: DC

## 2019-04-02 MED ORDER — CITALOPRAM HYDROBROMIDE 40 MG PO TABS: 40.0000 mg | ORAL_TABLET | Freq: Every day | ORAL | 0 refills | Status: DC

## 2019-04-02 MED ORDER — LISINOPRIL 10 MG PO TABS: 10.0000 mg | ORAL_TABLET | Freq: Every day | ORAL | 0 refills | Status: DC

## 2019-04-02 NOTE — Telephone Encounter (Signed)
Drug store called requesting refill on   Levothyroxine 112 MCG Lisinopril 10 MG  Celexa  40 MG

## 2019-04-07 ENCOUNTER — Ambulatory Visit (INDEPENDENT_AMBULATORY_CARE_PROVIDER_SITE_OTHER): Payer: PPO

## 2019-04-07 DIAGNOSIS — Z0000 Encounter for general adult medical examination without abnormal findings: Secondary | ICD-10-CM | POA: Diagnosis not present

## 2019-04-07 NOTE — Progress Notes (Signed)
Subjective:   [name] is a 69 y.o. female who presents for Medicare Annual (Subsequent) preventive examination.  This visit is being conducted via phone call  - after an attmept to do on video chat - due to the COVID-19 pandemic. This patient has given me verbal consent via phone to conduct this visit, patient states they are participating from their home address. Some vital signs may be absent or patient reported.   Patient identification: identified by name, DOB, and current address.    Review of Systems:   Cardiac Risk Factors include: hypertension;dyslipidemia;advanced age (>32men, >26 women)     Objective:     Vitals: There were no vitals taken for this visit.  There is no height or weight on file to calculate BMI.  Advanced Directives 04/07/2019 12/30/2017 12/02/2017 12/01/2017 11/30/2017 10/28/2017 05/14/2017  Does Patient Have a Medical Advance Directive? Yes Yes (No Data) No (No Data) No Yes  Type of Paramedic of [place]  Does patient want to make changes to medical advance directive? - - - - - - Yes (MAU/Ambulatory/Procedural Areas - Information given)  Copy of [place] in Chart? No - copy requested - - - - - -  Would patient like information on creating a medical advance directive? - - - Yes (Inpatient - patient requests chaplain consult to create a medical advance directive) - No - Patient declined -    Tobacco Social History   Tobacco Use  Smoking Status Current Every Day Smoker  . Packs/day: 0.50  . Years: 35.00  . Pack years: 17.50  . Types: Cigarettes  . Start date: 04/19/2018  Smokeless Tobacco Never Used  Tobacco Comment   Previously smoked 2 ppd.      Ready to quit: Yes Counseling given: Yes Comment: Previously smoked 2 ppd.    Clinical Intake:  Pre-visit preparation completed: Yes  Pain : No/denies pain     Nutritional Risks: None Diabetes: No  How often  do you need to have someone help you when you read instructions, pamphlets, or other written materials from your doctor or pharmacy?: 1 - Never  Interpreter Needed?: No  Information entered by :: Ruthetta Koopmann,LPN  Past Medical History:  Diagnosis Date  . Anxiety   . Arthritis   . Chronic back pain   . COPD (chronic obstructive pulmonary disease) ([place])   . Depression   . GERD (gastroesophageal reflux disease)   . Hypertension   . Hypothyroidism   . Pneumonia    RECENT X 3  . PONV (postoperative nausea and vomiting)   . Psoriasis   . Thyroid disease    Past Surgical History:  Procedure Laterality Date  . ABDOMINAL HYSTERECTOMY     heavy bleeding  . bladder stimulator    . CARPAL TUNNEL RELEASE    . CATARACT EXTRACTION W/PHACO Right 03/07/2015   Procedure: CATARACT EXTRACTION PHACO AND INTRAOCULAR LENS PLACEMENT (IOC);  Surgeon: Estill Cotta, MD;  Location: [facility] ORS;  Service: Ophthalmology;  Laterality: Right;  [name] 01:14 AP% 26.4 CDE 33.04 fluid pack lot # [id number]  . CATARACT EXTRACTION W/PHACO Left 03/21/2015   Procedure: CATARACT EXTRACTION PHACO AND INTRAOCULAR LENS PLACEMENT (IOC);  Surgeon: Estill Cotta, MD;  Location: [facility] ORS;  Service: Ophthalmology;  Laterality: Left;  [name] 01:21 AP% 25.6 CDE 39.78 fluid pack lot # [id number]  . HAND SURGERY Right   . INTERSTIM IMPLANT REMOVAL N/A 09/07/2015  Procedure: REMOVAL OF INTERSTIM IMPLANT;  Surgeon: Bjorn Loser, MD;  Location: [facility] ORS;  Service: Urology;  Laterality: N/A;  . OOPHORECTOMY Right    ovarian cyst   Family History  Problem Relation Age of Onset  . Heart disease Mother   . Breast cancer Mother   . Heart attack Mother   . COPD Father   . Stroke Paternal Uncle   . Kidney disease Sister   . Breast cancer Cousin   . Breast cancer Cousin   . Heart attack Maternal Aunt   . Stroke Paternal Grandfather   . Kidney cancer Neg Hx   . Bladder Cancer Neg Hx    Social History   Socioeconomic History    . Marital status: Widowed    Spouse name: Not on file  . Number of children: Not on file  . Years of education: Not on file  . Highest education level: Not on file  Occupational History  . Occupation: retired  Tobacco Use  . Smoking status: Current Every Day Smoker    Packs/day: 0.50    Years: 35.00    Pack years: 17.50    Types: Cigarettes    Start date: 04/19/2018  . Smokeless tobacco: Never Used  . Tobacco comment: Previously smoked 2 ppd.   Substance and Sexual Activity  . Alcohol use: No  . Drug use: No  . Sexual activity: Never    Birth control/protection: Surgical  Other Topics Concern  . Not on file  Social History Narrative  . Not on file   Social Determinants of Health   Financial Resource Strain: Low Risk   . Difficulty of Paying Living Expenses: Not very hard  Food Insecurity: No Food Insecurity  . Worried About Charity fundraiser in the Last Year: Never true  . Ran Out of Food in the Last Year: Never true  Transportation Needs: No Transportation Needs  . Lack of Transportation (Medical): No  . Lack of Transportation (Non-Medical): No  Physical Activity:   . Days of Exercise per Week: Not on file  . Minutes of Exercise per Session: Not on file  Stress: Stress Concern Present  . Feeling of Stress : Rather much  Social Connections: Somewhat Isolated  . Frequency of Communication with Friends and Family: More than three times a week  . Frequency of Social Gatherings with Friends and Family: More than three times a week  . Attends Religious Services: More than 4 times per year  . Active Member of Clubs or Organizations: No  . Attends Archivist Meetings: Never  . Marital Status: Widowed    Outpatient Encounter Medications as of 04/07/2019  Medication Sig  . acetaminophen (TYLENOL) 500 MG tablet Take 500 mg by mouth every 6 (six) hours as needed for moderate pain.   . busPIRone (BUSPAR) 7.5 MG tablet TAKE ONE TABLET BY MOUTH THREE TIMES DAILY  .  carvedilol (COREG) 3.125 MG tablet Take 1 tablet (3.125 mg total) by mouth 2 (two) times daily with a meal.  . citalopram (CELEXA) 40 MG tablet Take 1 tablet (40 mg total) by mouth daily.  . clonazePAM (KLONOPIN) 1 MG tablet TAKE ONE TABLET BY MOUTH EVERY MORNING, ONE-HALF TABLET EVERY AFTERNOON & ONE TABLET AT BEDTIME  . clotrimazole-betamethasone (LOTRISONE) cream Apply 1 application topically 2 (two) times daily.  Marland Kitchen diltiazem (CARDIZEM CD) 120 MG 24 hr capsule TAKE ONE CAPSULE BY MOUTH ONCE DAILY  . levothyroxine (SYNTHROID) 112 MCG tablet Take 1 tablet (112 mcg total)  by mouth daily before breakfast.  . lisinopril (ZESTRIL) 10 MG tablet Take 1 tablet (10 mg total) by mouth daily.  Marland Kitchen PROAIR HFA 108 (90 Base) MCG/ACT inhaler INHALE 2 PUFFS BY MOUTH INTO THE LUNGS EVERY 6 HOURS AS NEEDED FOR WHEEZING OR SHORTNESS OF BREATH  . TRELEGY ELLIPTA 100-62.5-25 MCG/INH AEPB Inhale 1 puff into the lungs daily.  Marland Kitchen triamcinolone (KENALOG) 0.025 % ointment Apply 1 application topically as directed.   . hydrOXYzine (ATARAX/VISTARIL) 25 MG tablet Take 25 mg by mouth at bedtime.  . Misc. Devices (QUAD CANE/SMALL BASE) MISC 1 Device by Does not apply route daily. (Patient not taking: Reported on 04/07/2019)  . [DISCONTINUED] carvedilol (COREG) 3.125 MG tablet Take 1 tablet (3.125 mg total) by mouth 2 (two) times daily with a meal.  . [DISCONTINUED] DOK 100 MG capsule Take 100 mg by mouth 2 (two) times daily.  . [DISCONTINUED] Secukinumab (COSENTYX) 150 MG/ML SOSY Inject 150 mg into the skin every 14 (fourteen) days. 2 injections per month  . [DISCONTINUED] TREMFYA 100 MG/ML SOPN    No facility-administered encounter medications on file as of 04/07/2019.    Activities of Daily Living In your present state of health, do you have any difficulty performing the following activities: 04/07/2019  Hearing? N  Comment no hearing aids  Vision? Y  Comment right eye blurry. eyeglasses, [facility]  Difficulty  concentrating or making decisions? N  Walking or climbing stairs? Y  Comment back and hip pain  Dressing or bathing? N  Doing errands, shopping? Y  Comment cousin helps  Preparing Food and eating ? N  Using the Toilet? N  In the past six months, have you accidently leaked urine? N  Do you have problems with loss of bowel control? N  Managing your Medications? N  Managing your Finances? N  Housekeeping or managing your Housekeeping? N  Comment takes time  Some recent data might be hidden    Patient Care Team: Malfi, Lupita Raider, FNP as PCP - General (Family Medicine) Dingeldein, Remo Lipps, MD as Consulting Physician (Ophthalmology) Jannet Mantis, MD as Consulting Physician (Dermatology) Corey Skains, MD as Consulting Physician (Cardiology) Minor, Dalbert Garnet, RN (Inactive) as Case Manager    Assessment:   This is a routine wellness examination for [place].  Exercise Activities and Dietary recommendations Current Exercise Habits: The patient does not participate in regular exercise at present, Exercise limited by: None identified  Goals Addressed   None     Fall Risk: Fall Risk  04/07/2019 12/31/2018 12/05/2017 06/26/2017 05/14/2017  Falls in the past year? 1 0 Yes Yes Yes  Comment - Emmi Telephone Survey: data to providers prior to load - - -  Number falls in past yr: 0 - 2 or more 2 or more 1  Comment fell couple weeks ago - - - -  Injury with Fall? 1 - No No No  Comment left shoulder pain - - - -  Risk Factor Category  - - High Fall Risk - -  Risk for fall due to : - - History of fall(s) History of fall(s) -  Risk for fall due to: Comment - - - - -  Follow up Falls prevention discussed - Falls prevention discussed - Falls prevention discussed    FALL RISK PREVENTION PERTAINING TO THE HOME:  Any stairs in or around the home? No  If so, are there any without handrails? No   Home free of loose throw rugs in walkways, pet  beds, electrical cords, etc? Yes  Adequate  lighting in your home to reduce risk of falls? Yes   ASSISTIVE DEVICES UTILIZED TO PREVENT FALLS:  Life alert? No  Use of a cane, walker or w/c? Yes  walker  Grab bars in the bathroom? No  Shower chair or bench in shower? No  Elevated toilet seat or a handicapped toilet? No   DME ORDERS:  DME order needed?  No   TIMED UP AND GO:  unable to perform    Depression Screen PHQ 2/9 Scores 04/07/2019 01/09/2019 10/10/2018 05/16/2018  PHQ - 2 Score 4 6 2 1   PHQ- 9 Score 5 12 3 1   Exception Documentation - - - -     Cognitive Function     6CIT Screen 05/14/2017 05/07/2016  What Year? 0 points 0 points  What month? 0 points 0 points  What time? 0 points 0 points  Count back from 20 0 points 0 points  Months in reverse 0 points 0 points  Repeat phrase 0 points 4 points  Total Score 0 4    Immunization History  Administered Date(s) Administered  . Influenza, High Dose Seasonal PF 01/07/2015, 11/14/2015, 11/06/2016, 11/20/2017  . Influenza, Seasonal, Injecte, Preservative Fre 10/27/2008, 12/13/2010, 11/05/2012  . Influenza,inj,Quad PF,6+ Mos 10/26/2013  . Influenza-Unspecified 10/06/2013  . Pneumococcal Conjugate-13 05/14/2017  . Pneumococcal Polysaccharide-23 11/25/2013    Qualifies for Shingles Vaccine? Yes  Zostavax completed n/a. Due for Shingrix. Education has been provided regarding the importance of this vaccine. Pt has been advised to call insurance company to determine out of pocket expense. Advised may also receive vaccine at local pharmacy or Health Dept. Verbalized acceptance and understanding.  Tdap: up to date   Flu Vaccine: declined   Pneumococcal Vaccine: due, will get 04/08/2019  Covid-19 Vaccine: declined   Screening Tests Health Maintenance  Topic Date Due  . MAMMOGRAM  08/31/2018  . PNA vac Low Risk Adult (2 of 2 - PPSV23) 11/26/2018  . INFLUENZA VACCINE  05/06/2019 (Originally 09/06/2018)  . COLONOSCOPY  02/05/2026 (Originally 02/27/1999)  . TETANUS/TDAP   02/05/2021  . DEXA SCAN  Completed  . Hepatitis C Screening  Completed    Cancer Screenings:  Colorectal Screening: declined all screenings  Mammogram: ordered.   Bone Density: Completed 2015.   Lung Cancer Screening: (Low Dose CT Chest recommended if Age 70-80 years, 30 pack-year currently smoking OR have quit w/in 15years.) does qualify.   Declined   Additional Screening:  Hepatitis C Screening: does qualify; Completed 12/31/2018  Vision Screening: Recommended annual ophthalmology exams for early detection of glaucoma and other disorders of the eye. Is the patient up to date with their annual eye exam?  Yes  Who is the provider or what is the name of the office in which the pt attends annual eye exams? [facility]    Dental Screening: Recommended annual dental exams for proper oral hygiene  Community Resource Referral:  CRR required this visit?  No       Plan:  I have personally reviewed and addressed the Medicare Annual Wellness questionnaire and have noted the following in the patient's chart:  A. Medical and social history B. Use of alcohol, tobacco or illicit drugs  C. Current medications and supplements D. Functional ability and status E.  Nutritional status F.  Physical activity G. Advance directives H. List of other physicians I.  Hospitalizations, surgeries, and ER visits in previous 12 months J.  Vitals K. Screenings such as hearing  and vision if needed, cognitive and depression L. Referrals and appointments   In addition, I have reviewed and discussed with patient certain preventive protocols, quality metrics, and best practice recommendations. A written personalized care plan for preventive services as well as general preventive health recommendations were provided to patient.  Signed,    Bevelyn Ngo, LPN  [id number] Nurse Health Advisor   Nurse Notes: none

## 2019-04-07 NOTE — Patient Instructions (Signed)
[name] , Thank you for taking time to come for your Medicare Wellness Visit. I appreciate your ongoing commitment to your health goals. Please review the following plan we discussed and let me know if I can assist you in the future.   Screening recommendations/referrals: Colonoscopy: declined Mammogram: Please call [phone] to schedule your mammogram.  Bone Density: up to date  Recommended yearly ophthalmology/optometry visit for glaucoma screening and checkup Recommended yearly dental visit for hygiene and checkup  Vaccinations: Influenza vaccine: declined  Pneumococcal vaccine: pneumococcal 23 due  Tdap vaccine: up to date  Shingles vaccine: shingrix eligible    Covid-19: declined   Advanced directives: Please bring a copy of your health care power of attorney to the office at your convenience.  Conditions/risks identified: please call if you need anything   Next appointment: Follow up in one year for your annual wellness visit    Preventive Care 70 Years and Older, Female Preventive care refers to lifestyle choices and visits with your health care provider that can promote health and wellness. What does preventive care include?  A yearly physical exam. This is also called an annual well check.  Dental exams once or twice a year.  Routine eye exams. Ask your health care provider how often you should have your eyes checked.  Personal lifestyle choices, including:  Daily care of your teeth and gums.  Regular physical activity.  Eating a healthy diet.  Avoiding tobacco and drug use.  Limiting alcohol use.  Practicing safe sex.  Taking low-dose aspirin every day.  Taking vitamin and mineral supplements as recommended by your health care provider. What happens during an annual well check? The services and screenings done by your health care provider during your annual well check will depend on your age, overall health, lifestyle risk factors, and family history  of disease. Counseling  Your health care provider may ask you questions about your:  Alcohol use.  Tobacco use.  Drug use.  Emotional well-being.  Home and relationship well-being.  Sexual activity.  Eating habits.  History of falls.  Memory and ability to understand (cognition).  Work and work Statistician.  Reproductive health. Screening  You may have the following tests or measurements:  Height, weight, and BMI.  Blood pressure.  Lipid and cholesterol levels. These may be checked every 5 years, or more frequently if you are over 49 years old.  Skin check.  Lung cancer screening. You may have this screening every year starting at age 71 if you have a 30-pack-year history of smoking and currently smoke or have quit within the past 15 years.  Fecal occult blood test (FOBT) of the stool. You may have this test every year starting at age 81.  Flexible sigmoidoscopy or colonoscopy. You may have a sigmoidoscopy every 5 years or a colonoscopy every 10 years starting at age 4.  Hepatitis C blood test.  Hepatitis B blood test.  Sexually transmitted disease (STD) testing.  Diabetes screening. This is done by checking your blood sugar (glucose) after you have not eaten for a while (fasting). You may have this done every 1-3 years.  Bone density scan. This is done to screen for osteoporosis. You may have this done starting at age 94.  Mammogram. This may be done every 1-2 years. Talk to your health care provider about how often you should have regular mammograms. Talk with your health care provider about your test results, treatment options, and if necessary, the need for more tests. Vaccines  Your health care provider may recommend certain vaccines, such as:  Influenza vaccine. This is recommended every year.  Tetanus, diphtheria, and acellular pertussis (Tdap, Td) vaccine. You may need a Td booster every 10 years.  Zoster vaccine. You may need this after age  74.  Pneumococcal 13-valent conjugate (PCV13) vaccine. One dose is recommended after age 19.  Pneumococcal polysaccharide (PPSV23) vaccine. One dose is recommended after age 2. Talk to your health care provider about which screenings and vaccines you need and how often you need them. This information is not intended to replace advice given to you by your health care provider. Make sure you discuss any questions you have with your health care provider. Document Released: 02/18/2015 Document Revised: 10/12/2015 Document Reviewed: 11/23/2014 Elsevier Interactive Patient Education  2017 [place] Prevention in the Home Falls can cause injuries. They can happen to people of all ages. There are many things you can do to make your home safe and to help prevent falls. What can I do on the outside of my home?  Regularly fix the edges of walkways and driveways and fix any cracks.  Remove anything that might make you trip as you walk through a door, such as a raised step or threshold.  Trim any bushes or trees on the path to your home.  Use bright outdoor lighting.  Clear any walking paths of anything that might make someone trip, such as rocks or tools.  Regularly check to see if handrails are loose or broken. Make sure that both sides of any steps have handrails.  Any raised decks and porches should have guardrails on the edges.  Have any leaves, snow, or ice cleared regularly.  Use sand or salt on walking paths during winter.  Clean up any spills in your garage right away. This includes oil or grease spills. What can I do in the bathroom?  Use night lights.  Install grab bars by the toilet and in the tub and shower. Do not use towel bars as grab bars.  Use non-skid mats or decals in the tub or shower.  If you need to sit down in the shower, use a plastic, non-slip stool.  Keep the floor dry. Clean up any water that spills on the floor as soon as it happens.  Remove  soap buildup in the tub or shower regularly.  Attach bath mats securely with double-sided non-slip rug tape.  Do not have throw rugs and other things on the floor that can make you trip. What can I do in the bedroom?  Use night lights.  Make sure that you have a light by your bed that is easy to reach.  Do not use any sheets or blankets that are too big for your bed. They should not hang down onto the floor.  Have a firm chair that has side arms. You can use this for support while you get dressed.  Do not have throw rugs and other things on the floor that can make you trip. What can I do in the kitchen?  Clean up any spills right away.  Avoid walking on wet floors.  Keep items that you use a lot in easy-to-reach places.  If you need to reach something above you, use a strong step stool that has a grab bar.  Keep electrical cords out of the way.  Do not use floor polish or wax that makes floors slippery. If you must use wax, use non-skid floor wax.  Do  not have throw rugs and other things on the floor that can make you trip. What can I do with my stairs?  Do not leave any items on the stairs.  Make sure that there are handrails on both sides of the stairs and use them. Fix handrails that are broken or loose. Make sure that handrails are as long as the stairways.  Check any carpeting to make sure that it is firmly attached to the stairs. Fix any carpet that is loose or worn.  Avoid having throw rugs at the top or bottom of the stairs. If you do have throw rugs, attach them to the floor with carpet tape.  Make sure that you have a light switch at the top of the stairs and the bottom of the stairs. If you do not have them, ask someone to add them for you. What else can I do to help prevent falls?  Wear shoes that:  Do not have high heels.  Have rubber bottoms.  Are comfortable and fit you well.  Are closed at the toe. Do not wear sandals.  If you use a  stepladder:  Make sure that it is fully opened. Do not climb a closed stepladder.  Make sure that both sides of the stepladder are locked into place.  Ask someone to hold it for you, if possible.  Clearly mark and make sure that you can see:  Any grab bars or handrails.  First and last steps.  Where the edge of each step is.  Use tools that help you move around (mobility aids) if they are needed. These include:  Canes.  Walkers.  Scooters.  Crutches.  Turn on the lights when you go into a dark area. Replace any light bulbs as soon as they burn out.  Set up your furniture so you have a clear path. Avoid moving your furniture around.  If any of your floors are uneven, fix them.  If there are any pets around you, be aware of where they are.  Review your medicines with your doctor. Some medicines can make you feel dizzy. This can increase your chance of falling. Ask your doctor what other things that you can do to help prevent falls. This information is not intended to replace advice given to you by your health care provider. Make sure you discuss any questions you have with your health care provider. Document Released: 11/18/2008 Document Revised: 06/30/2015 Document Reviewed: 02/26/2014 Elsevier Interactive Patient Education  2017 [employer].

## 2019-04-08 ENCOUNTER — Other Ambulatory Visit: Payer: Self-pay

## 2019-04-08 ENCOUNTER — Ambulatory Visit
Admission: RE | Admit: 2019-04-08 | Discharge: 2019-04-08 | Disposition: A | Discharge status: Home / Self Care | Payer: PPO | Source: Ambulatory Visit | Attending: Family Medicine | Admitting: Family Medicine

## 2019-04-08 ENCOUNTER — Encounter: Payer: Self-pay | Admitting: Family Medicine

## 2019-04-08 ENCOUNTER — Ambulatory Visit: Admission: RE | Admit: 2019-04-08 | Payer: PPO | Source: Ambulatory Visit | Admitting: *Deleted

## 2019-04-08 ENCOUNTER — Ambulatory Visit (INDEPENDENT_AMBULATORY_CARE_PROVIDER_SITE_OTHER): Payer: PPO | Admitting: Family Medicine

## 2019-04-08 VITALS — BP 128/61 | HR 88 | Temp 98.4°F | Ht 63.0 in | Wt 203.4 lb

## 2019-04-08 DIAGNOSIS — E039 Hypothyroidism, unspecified: Secondary | ICD-10-CM | POA: Diagnosis not present

## 2019-04-08 DIAGNOSIS — J449 Chronic obstructive pulmonary disease, unspecified: Secondary | ICD-10-CM

## 2019-04-08 DIAGNOSIS — I10 Essential (primary) hypertension: Secondary | ICD-10-CM

## 2019-04-08 DIAGNOSIS — M25512 Pain in left shoulder: Secondary | ICD-10-CM | POA: Insufficient documentation

## 2019-04-08 DIAGNOSIS — E782 Mixed hyperlipidemia: Secondary | ICD-10-CM | POA: Diagnosis not present

## 2019-04-08 DIAGNOSIS — F3341 Major depressive disorder, recurrent, in partial remission: Secondary | ICD-10-CM | POA: Diagnosis not present

## 2019-04-08 DIAGNOSIS — F410 Panic disorder [episodic paroxysmal anxiety] without agoraphobia: Secondary | ICD-10-CM | POA: Diagnosis not present

## 2019-04-08 DIAGNOSIS — J42 Unspecified chronic bronchitis: Secondary | ICD-10-CM

## 2019-04-08 DIAGNOSIS — Z23 Encounter for immunization: Secondary | ICD-10-CM

## 2019-04-08 DIAGNOSIS — S4992XA Unspecified injury of left shoulder and upper arm, initial encounter: Secondary | ICD-10-CM | POA: Diagnosis not present

## 2019-04-08 MED ORDER — HYDROXYZINE HCL 25 MG PO TABS: 25.0000 mg | ORAL_TABLET | Freq: Every day | ORAL | 1 refills | Status: DC

## 2019-04-08 MED ORDER — DILTIAZEM HCL ER COATED BEADS 120 MG PO CP24: 120.0000 mg | ORAL_CAPSULE | Freq: Every day | ORAL | 1 refills | Status: DC

## 2019-04-08 MED ORDER — TRELEGY ELLIPTA 100-62.5-25 MCG/INH IN AEPB: 1.0000 | INHALATION_SPRAY | Freq: Every day | RESPIRATORY_TRACT | 3 refills | Status: DC

## 2019-04-08 MED ORDER — LISINOPRIL 10 MG PO TABS: 10.0000 mg | ORAL_TABLET | Freq: Every day | ORAL | 1 refills | Status: DC

## 2019-04-08 MED ORDER — LEVOTHYROXINE SODIUM 112 MCG PO TABS: 112.0000 ug | ORAL_TABLET | Freq: Every day | ORAL | 1 refills | Status: DC

## 2019-04-08 MED ORDER — ALBUTEROL SULFATE HFA 108 (90 BASE) MCG/ACT IN AERS: INHALATION_SPRAY | RESPIRATORY_TRACT | 1 refills | Status: DC

## 2019-04-08 MED ORDER — CLOTRIMAZOLE-BETAMETHASONE 1-0.05 % EX CREA: 1.0000 "application " | TOPICAL_CREAM | Freq: Two times a day (BID) | CUTANEOUS | 0 refills | Status: DC

## 2019-04-08 MED ORDER — CITALOPRAM HYDROBROMIDE 40 MG PO TABS: 40.0000 mg | ORAL_TABLET | Freq: Every day | ORAL | 1 refills | Status: DC

## 2019-04-08 MED ORDER — CARVEDILOL 3.125 MG PO TABS: 3.1250 mg | ORAL_TABLET | Freq: Two times a day (BID) | ORAL | 1 refills | Status: DC

## 2019-04-08 MED ORDER — BUSPIRONE HCL 7.5 MG PO TABS: 7.5000 mg | ORAL_TABLET | Freq: Three times a day (TID) | ORAL | 5 refills | Status: DC

## 2019-04-08 NOTE — Assessment & Plan Note (Signed)
Stable and well controlled on current medication regimen.  Currently taking Carvedilol 3.125mg  2x a day with meals, diltiazem 120mg  daily, and lisinopril 10mg  daily and tolerating it well.   Plan: 1) Labs ordered and drawn today 2) Continue current medication regimen  3) Take blood pressure readings regularly and write in a log.  Bring that log to your next appointment. 4) Heart healthy diet and to exercise every other day for 30 minutes per day, going no more than 2 days in a row without exercise. 5) We will see you back in 6 months

## 2019-04-08 NOTE — Patient Instructions (Addendum)
As we discussed, I have put in an order for a left shoulder x-ray.  Once this is completed and reviewed by radiology we can determine if you should follow up with Orthopedics or if a referral to structured physical therapy should be first.  I have attached range of motion shoulder exercises to help with increasing your range of motion.  Range of Motion Shoulder Exercises  Pendulum Circles - Lean with your good arm against a counter or table for support - Bend forward with a wide stance (make sure your body is comfortable) - Your painful shoulder should hang down and feel "heavy" - Gently move your painful arm in small circles "clockwise" for several turns - Switch to "counterclockwise" for several turns - Early on keep circles narrow and move slowly - Later in rehab, move in larger circles and faster movement   Wall Crawl - Stand close (about 1-2 ft away) to a wall, facing it directly - Reach out with your arm of painful shoulder and place fingers (not palm) on wall - You should make contact with wall at your waist level - Slowly walk your fingers up the wall. Stay in contact with wall entire time, do not remove fingers - Keep walking fingers up wall until you reach shoulder level - You may feel tightening or mild discomfort, once you reach a height that causes pain or if you are already above your shoulder height then stop. Repeat from starting position. - Early on stand closer to wall, move fingers slowly, and stay at or below shoulder level - Later in rehab, stand farther away from wall (fingertips), move fingers quicker, go above shoulder level   I have put in orders for your labs today and we will contact you once we receive the results.  Refills have been sent to the pharmacy for all of your medications.  Try to get exercise a minimum of 30 minutes per day at least 5 days per week as well as  adequate water intake all while measuring blood pressure a few times per week.  Keep a  blood pressure log and bring back to clinic at your next visit.  If your readings are consistently over 140/90 to contact our office/send me a MyChart message and we will see you sooner.  Can try DASH and Mediterranean diet options, avoiding processed foods, lowering sodium intake, avoiding pork products, and eating a plant based diet for optimal health.  The following recommendations are helpful adjuncts for helping rebalance your mood.  Eat a nourishing diet. Ensure adequate intake of calories, protein, carbs, fat, vitamins, and minerals. Prioritize whole foods at each meal, including meats, vegetables, fruits, nuts and seeds, etc.   Avoid inflammatory and/or "junk" foods, such as sugar, omega-6 fats, refined grains, chemicals, and preservatives are common in packaged and prepared foods. Minimize or completely avoid these ingredients and stick to whole foods with little to no additives. Cook from scratch as much as possible for more control over what you eat  Get enough sleep. Poor sleep is significantly associated with depression and anxiety. Make 7-9 hours of sleep nightly a top priority  Exercise appropriately. Exercise is known to improve brain functioning and boost mood. Aim for 30 minutes of daily physical activity. Avoid "overtraining," which can cause mental disturbances  Assess your light exposure. Not enough natural light during the day and too much artificial light can have a major impact on your mood. Get outside as often as possible during daylight hours. Minimize light  exposure after dark and avoid the use of electronics that give off blue light before bed  Manage your stress.  Use daily stress management techniques such as meditation, yoga, or mindfulness to retrain your brain to respond differently to stress. Try deep breathing to deactivate your "fight or flight" response.  There are many of sources with apps like Headspace, Calm or a variety of YouTube videos (videos from Gwynne Edinger have guided meditation)  Prioritize your social life. Work on building social support with new friends or improve current relationships. Consider getting a pet that allows for companionship, social interaction, and physical touch. Try volunteering or joining a faith-based community to increase your sense of purpose  4-7-8 breathing technique at bedtime: breathe in to count of 4, hold breath for count of 7, exhale for count of 8; do 3-5 times for letting go of overactive thoughts  Take time to play Unstructured "play" time can help reduce anxiety and depression Options for play include music, games, sports, dance, art, etc.  Try to add daily omega 3 fatty acids, magnesium, B complex, and balanced amino acid supplements to help improve mood and anxiety.  We will plan to see you back in 6 months for follow up on your hypertension, hypothyroid and anxiety and as needed for left shoulder pain.  You will receive a survey after today's visit either digitally by e-mail or paper by [employer]. Your experiences and feedback matter to [name].  Please respond so we know how we are doing as we provide care for you.  Call us with any questions/concerns/needs.  It is my goal to be available to you for your health concerns.  Thanks for choosing me to be a partner in your healthcare needs!  Harlin Rain, FNP-C Family Nurse Practitioner [facility] Phone: [phone]

## 2019-04-08 NOTE — Assessment & Plan Note (Signed)
Has recently started using Trelegy inhaler and has not had additional DOE.  Will send in refills on Trelegy.  No recent flares of COPD. Has been taking medications as directed. Will follow up in 6 months, sooner should the need arise.

## 2019-04-08 NOTE — Assessment & Plan Note (Signed)
No current labs on file for review, will have labs drawn to assess status.  Currently not taking any medications for hyperlipidemia.   Plan: 1) Labs ordered and completed today 2) Heart healthy diet and to exercise every other day for 30 minutes per day, going no more than 2 days in a row without exercise. 3) We will see you back in 6 months

## 2019-04-08 NOTE — Assessment & Plan Note (Signed)
Stable and well controlled.  Currently taking Buspar 7.5mg , 3x a day, Celexa 40mg  daily, Hydroxyzine 25mg  at bedtime, Klonopin 1mg  daily and tolerating well without known side effects.  Will continue to work on techniques to help lower her anxiety and depression such as meditation, mindfulness, and guided imagery. Will see back in 6 months.

## 2019-04-08 NOTE — Assessment & Plan Note (Signed)
S/P fall onto left shoulder approx 2 weeks ago with decrease in ROM and tenderness to palpation.  Denies numbness, tingling, weakness.  Discussed, likely muscle strain/sprain based on exam but cannot rule out rotator cuff abnormalities.  Will have an xray completed today, begin a structured routine with physical therapy along with home exercises and will reassess if not getting better, will refer to Orthopedics for additional evaluation and treatment.  Can alternate heat/ice as needed for symptom management.

## 2019-04-08 NOTE — Progress Notes (Signed)
Subjective:    Patient ID: [name], female    DOB: [birth year], 69 y.o.   MRN: [id number]  [name] is a 69 y.o. female presenting on 04/08/2019 for Anxiety, Shoulder Pain (pt reports she fell standing up off the couch trying to pull up with her walker. The walker fell over and hit her left shoulder during the fall. x 2 weeks ago), Hypertension, Hypothyroidism, and COPD   HPI  [name] presents to clinic today for a follow up on her COPD, Anxiety, Hypothyroid, Hypertension and has new acute left shoulder pain.  Said she stood up fast at home and using the walker she had fallen onto her left side.  Has had some decreased ROM.  Denies numbness, tingling, weakness in left arm, sleep disturbances, hitting head, LOC, or difficulty getting dressed.  Depression screen [facility] 2/9 04/08/2019 04/07/2019 01/09/2019  Decreased Interest 1 1 3   Down, Depressed, Hopeless 1 3 3   PHQ - 2 Score 2 4 6   Altered sleeping 2 0 2  Tired, decreased energy 1 1 3   Change in appetite 3 0 1  Feeling bad or failure about yourself  0 0 0  Trouble concentrating 0 0 0  Moving slowly or fidgety/restless 0 0 0  Suicidal thoughts 0 0 0  PHQ-9 Score 8 5 12   Difficult doing work/chores Not difficult at all Not difficult at all Not difficult at all  Some recent data might be hidden    Social History   Tobacco Use  . Smoking status: Current Every Day Smoker    Packs/day: 0.50    Years: 35.00    Pack years: 17.50    Types: Cigarettes    Start date: 04/19/2018  . Smokeless tobacco: Never Used  . Tobacco comment: Previously smoked 2 ppd.   Substance Use Topics  . Alcohol use: No  . Drug use: No    Review of Systems  Constitutional: Negative.   HENT: Negative.   Eyes: Negative.   Respiratory: Negative.   Cardiovascular: Negative.   Gastrointestinal: Negative.   Endocrine: Negative.   Genitourinary: Negative.   Musculoskeletal: Positive for myalgias. Negative for back pain, joint swelling and neck pain.    Allergic/Immunologic: Negative.   Neurological: Negative.   Hematological: Negative.   Psychiatric/Behavioral: Negative.    Per HPI unless specifically indicated above     Objective:    BP 128/61 (BP Location: Right Arm, Patient Position: Sitting, Cuff Size: Normal)   Pulse 88   Temp 98.4 F (36.9 C) (Oral)   Ht 5\' 3"  (1.6 m)   Wt 203 lb 6.4 oz (92.3 kg)   BMI 36.03 kg/m   Wt Readings from Last 3 Encounters:  04/08/19 203 lb 6.4 oz (92.3 kg)  10/10/18 180 lb (81.6 kg)  04/07/18 173 lb 6.4 oz (78.7 kg)    Physical Exam Vitals reviewed.  Constitutional:      General: She is not in acute distress.    Appearance: Normal appearance. She is well-groomed. She is obese. She is not ill-appearing or toxic-appearing.  HENT:     Head: Normocephalic.  Eyes:     General: Lids are normal. Vision grossly intact.        Right eye: No discharge.        Left eye: No discharge.     Extraocular Movements: Extraocular movements intact.     Conjunctiva/sclera: Conjunctivae normal.     Pupils: Pupils are equal, round, and reactive to light.  Neck:  Thyroid: No thyroid mass, thyromegaly or thyroid tenderness.  Cardiovascular:     Rate and Rhythm: Normal rate and regular rhythm.     Pulses: Normal pulses.          Dorsalis pedis pulses are 2+ on the right side and 2+ on the left side.       Posterior tibial pulses are 2+ on the right side and 2+ on the left side.     Heart sounds: Normal heart sounds. No murmur. No friction rub. No gallop.   Pulmonary:     Effort: Pulmonary effort is normal. No respiratory distress.     Breath sounds: Normal breath sounds.  Abdominal:     General: Abdomen is flat. Bowel sounds are normal.     Tenderness: There is no abdominal tenderness.  Musculoskeletal:        General: Tenderness and signs of injury present. No swelling.     Right shoulder: Normal.     Left shoulder: Tenderness present. No swelling, deformity, effusion or laceration. Decreased  range of motion. Decreased strength. Normal pulse.     Cervical back: Normal range of motion.     Right lower leg: No edema.     Left lower leg: No edema.  Feet:     Right foot:     Skin integrity: Skin integrity normal.     Left foot:     Skin integrity: Skin integrity normal.  Skin:    General: Skin is warm.     Capillary Refill: Capillary refill takes less than 2 seconds.  Neurological:     General: No focal deficit present.     Mental Status: She is alert and oriented to person, place, and time.     Cranial Nerves: No cranial nerve deficit.     Sensory: No sensory deficit.     Motor: No weakness.     Coordination: Coordination normal.  Psychiatric:        Attention and Perception: Attention and perception normal.        Mood and Affect: Mood and affect normal.        Speech: Speech normal.        Behavior: Behavior normal. Behavior is cooperative.        Thought Content: Thought content normal.        Cognition and Memory: Cognition and memory normal.        Judgment: Judgment normal.     Results for orders placed or performed in visit on 10/10/18  TSH  Result Value Ref Range   TSH 12.42 (H) 0.40 - 4.50 mIU/L  T4, free  Result Value Ref Range   Free T4 1.2 0.8 - 1.8 ng/dL  Hepatitis C antibody  Result Value Ref Range   Hepatitis C Ab NON-REACTIVE NON-REACTI   SIGNAL TO CUT-OFF 0.03 <1.00      Assessment & Plan:   Problem List Items Addressed This Visit      Cardiovascular and Mediastinum   Benign essential HTN (Chronic)    Stable and well controlled on current medication regimen.  Currently taking Carvedilol 3.125mg  2x a day with meals, diltiazem 120mg  daily, and lisinopril 10mg  daily and tolerating it well.   Plan: 1) Labs ordered and drawn today 2) Continue current medication regimen  3) Take blood pressure readings regularly and write in a log.  Bring that log to your next appointment. 4) Heart healthy diet and to exercise every other day for 30 minutes  per day, going no  more than 2 days in a row without exercise. 5) We will see you back in 6 months      Relevant Medications   carvedilol (COREG) 3.125 MG tablet   diltiazem (CARDIZEM CD) 120 MG 24 hr capsule   lisinopril (ZESTRIL) 10 MG tablet   Other Relevant Orders   CBC with Differential   COMPLETE METABOLIC PANEL WITH GFR     Respiratory   COPD (chronic obstructive pulmonary disease) with chronic bronchitis (HCC) (Chronic)    Has recently started using Trelegy inhaler and has not had additional DOE.  Will send in refills on Trelegy.  No recent flares of COPD. Has been taking medications as directed. Will follow up in 6 months, sooner should the need arise.      Relevant Medications   albuterol (PROAIR HFA) 108 (90 Base) MCG/ACT inhaler   TRELEGY ELLIPTA 100-62.5-25 MCG/INH AEPB     Endocrine   Adult hypothyroidism (Chronic)    Last labs were showing an elevation of TSH from 6 to 12, will redraw labs today to assess levels and then will discuss if there needs to be any changes to daily medication regimen.  Currently taking Levothyroxine 161mcg daily and tolerating well without known side effects.  Will continue, labs drawn today, will contact with results, and see her back in 6 months for re-evaluation.      Relevant Medications   carvedilol (COREG) 3.125 MG tablet   levothyroxine (SYNTHROID) 112 MCG tablet   Other Relevant Orders   CBC with Differential   COMPLETE METABOLIC PANEL WITH GFR   Thyroid Panel With TSH     Other   Anxiety disorder (Chronic)    Stable and well controlled.  Currently taking Buspar 7.5mg , 3x a day, Celexa 40mg  daily, Hydroxyzine 25mg  at bedtime, Klonopin 1mg  daily and tolerating well without known side effects.  Will continue to work on techniques to help lower her anxiety and depression such as meditation, mindfulness, and guided imagery. Will see back in 6 months.      Relevant Medications   busPIRone (BUSPAR) 7.5 MG tablet   citalopram  (CELEXA) 40 MG tablet   hydrOXYzine (ATARAX/VISTARIL) 25 MG tablet   Clinical depression (Chronic)    See anxiety AP.      Relevant Medications   busPIRone (BUSPAR) 7.5 MG tablet   citalopram (CELEXA) 40 MG tablet   hydrOXYzine (ATARAX/VISTARIL) 25 MG tablet   Mixed hyperlipidemia (Chronic)    No current labs on file for review, will have labs drawn to assess status.  Currently not taking any medications for hyperlipidemia.   Plan: 1) Labs ordered and completed today 2) Heart healthy diet and to exercise every other day for 30 minutes per day, going no more than 2 days in a row without exercise. 3) We will see you back in 6 months      Relevant Medications   carvedilol (COREG) 3.125 MG tablet   diltiazem (CARDIZEM CD) 120 MG 24 hr capsule   lisinopril (ZESTRIL) 10 MG tablet   Other Relevant Orders   CBC with Differential   COMPLETE METABOLIC PANEL WITH GFR   Lipid Profile   Left shoulder pain    S/P fall onto left shoulder approx 2 weeks ago with decrease in ROM and tenderness to palpation.  Denies numbness, tingling, weakness.  Discussed, likely muscle strain/sprain based on exam but cannot rule out rotator cuff abnormalities.  Will have an xray completed today, begin a structured routine with physical therapy along with home  exercises and will reassess if not getting better, will refer to Orthopedics for additional evaluation and treatment.  Can alternate heat/ice as needed for symptom management.      Relevant Orders   DG Shoulder Left   Ambulatory referral to Physical Therapy    Other Visit Diagnoses    Need for Streptococcus pneumoniae vaccination    -  Primary   Relevant Orders   Pneumococcal polysaccharide vaccine 23-valent greater than or equal to 2yo subcutaneous/IM (Completed)   Chronic bronchitis, unspecified chronic bronchitis type ([place])       Relevant Medications   albuterol (PROAIR HFA) 108 (90 Base) MCG/ACT inhaler      Meds ordered this encounter    Medications  . busPIRone (BUSPAR) 7.5 MG tablet    Sig: Take 1 tablet (7.5 mg total) by mouth 3 (three) times daily.    Dispense:  60 tablet    Refill:  5    This prescription was filled on 10/27/2018. Any refills authorized will be placed on file.  . carvedilol (COREG) 3.125 MG tablet    Sig: Take 1 tablet (3.125 mg total) by mouth 2 (two) times daily with a meal.    Dispense:  180 tablet    Refill:  1  . citalopram (CELEXA) 40 MG tablet    Sig: Take 1 tablet (40 mg total) by mouth daily.    Dispense:  90 tablet    Refill:  1  . clotrimazole-betamethasone (LOTRISONE) cream    Sig: Apply 1 application topically 2 (two) times daily.    Dispense:  30 g    Refill:  0  . diltiazem (CARDIZEM CD) 120 MG 24 hr capsule    Sig: Take 1 capsule (120 mg total) by mouth daily.    Dispense:  90 capsule    Refill:  1  . hydrOXYzine (ATARAX/VISTARIL) 25 MG tablet    Sig: Take 1 tablet (25 mg total) by mouth at bedtime.    Dispense:  90 tablet    Refill:  1  . levothyroxine (SYNTHROID) 112 MCG tablet    Sig: Take 1 tablet (112 mcg total) by mouth daily before breakfast.    Dispense:  90 tablet    Refill:  1  . lisinopril (ZESTRIL) 10 MG tablet    Sig: Take 1 tablet (10 mg total) by mouth daily.    Dispense:  90 tablet    Refill:  1  . albuterol (PROAIR HFA) 108 (90 Base) MCG/ACT inhaler    Sig: INHALE 2 PUFFS BY MOUTH INTO THE LUNGS EVERY 6 HOURS AS NEEDED FOR WHEEZING OR SHORTNESS OF BREATH    Dispense:  25.5 g    Refill:  1    This prescription was filled on 07/08/2018. Any refills authorized will be placed on file.  . TRELEGY ELLIPTA 100-62.5-25 MCG/INH AEPB    Sig: Inhale 1 puff into the lungs daily.    Dispense:  60 each    Refill:  3      Follow up plan: Return in about 6 months (around 10/09/2019) for HTN, Hypothyroid, Anxiety.   Harlin Rain, [place] Family Nurse Practitioner [facility] 04/08/2019, 10:55 AM

## 2019-04-08 NOTE — Assessment & Plan Note (Signed)
Last labs were showing an elevation of TSH from 6 to 12, will redraw labs today to assess levels and then will discuss if there needs to be any changes to daily medication regimen.  Currently taking Levothyroxine 17mcg daily and tolerating well without known side effects.  Will continue, labs drawn today, will contact with results, and see her back in 6 months for re-evaluation.

## 2019-04-08 NOTE — Assessment & Plan Note (Signed)
See anxiety A/P. 

## 2019-04-09 ENCOUNTER — Other Ambulatory Visit: Payer: Self-pay | Admitting: Family Medicine

## 2019-04-09 DIAGNOSIS — E782 Mixed hyperlipidemia: Secondary | ICD-10-CM

## 2019-04-09 DIAGNOSIS — E039 Hypothyroidism, unspecified: Secondary | ICD-10-CM

## 2019-04-09 LAB — COMPLETE METABOLIC PANEL WITH GFR
AG Ratio: 1.6 (calc) (ref 1.0–2.5)
ALT: 17 U/L (ref 6–29)
AST: 19 U/L (ref 10–35)
Albumin: 4 g/dL (ref 3.6–5.1)
Alkaline phosphatase (APISO): 119 U/L (ref 37–153)
BUN/Creatinine Ratio: 16 (calc) (ref 6–22)
BUN: 16 mg/dL (ref 7–25)
CO2: 34 mmol/L — ABNORMAL HIGH (ref 20–32)
Calcium: 9.4 mg/dL (ref 8.6–10.4)
Chloride: 104 mmol/L (ref 98–110)
Creat: 1.02 mg/dL — ABNORMAL HIGH (ref 0.60–0.93)
GFR, Est African American: 65 mL/min/{1.73_m2} (ref >=60)
GFR, Est Non African American: 56 mL/min/{1.73_m2} — ABNORMAL LOW (ref >=60)
Globulin: 2.5 g/dL (calc) (ref 1.9–3.7)
Glucose, Bld: 104 mg/dL (ref 65–139)
Potassium: 4.4 mmol/L (ref 3.5–5.3)
Sodium: 143 mmol/L (ref 135–146)
Total Bilirubin: 0.4 mg/dL (ref 0.2–1.2)
Total Protein: 6.5 g/dL (ref 6.1–8.1)

## 2019-04-09 LAB — CBC WITH DIFFERENTIAL/PLATELET
Absolute Monocytes: 739 cells/uL (ref 200–950)
Basophils Absolute: 77 cells/uL (ref 0–200)
Basophils Relative: 1 %
Eosinophils Absolute: 408 cells/uL (ref 15–500)
Eosinophils Relative: 5.3 %
HCT: 45.6 % — ABNORMAL HIGH (ref 35.0–45.0)
Hemoglobin: 14.7 g/dL (ref 11.7–15.5)
Lymphs Abs: 2195 cells/uL (ref 850–3900)
MCH: 29.6 pg (ref 27.0–33.0)
MCHC: 32.2 g/dL (ref 32.0–36.0)
MCV: 91.8 fL (ref 80.0–100.0)
MPV: 10.8 fL (ref 7.5–12.5)
Monocytes Relative: 9.6 %
Neutro Abs: 4281 cells/uL (ref 1500–7800)
Neutrophils Relative %: 55.6 %
Platelets: 276 10*3/uL (ref 140–400)
RBC: 4.97 10*6/uL (ref 3.80–5.10)
RDW: 13.3 % (ref 11.0–15.0)
Total Lymphocyte: 28.5 %
WBC: 7.7 10*3/uL (ref 3.8–10.8)

## 2019-04-09 LAB — LIPID PANEL
Cholesterol: 201 mg/dL — ABNORMAL HIGH (ref <200)
HDL: 36 mg/dL — ABNORMAL LOW (ref >=50)
LDL Cholesterol (Calc): 116 mg/dL (calc) — ABNORMAL HIGH
Non-HDL Cholesterol (Calc): 165 mg/dL (calc) — ABNORMAL HIGH (ref <130)
Total CHOL/HDL Ratio: 5.6 (calc) — ABNORMAL HIGH (ref <5.0)
Triglycerides: 340 mg/dL — ABNORMAL HIGH (ref <150)

## 2019-04-09 LAB — THYROID PANEL WITH TSH
Free Thyroxine Index: 3.1 (ref 1.4–3.8)
T3 Uptake: 29 % (ref 22–35)
T4, Total: 10.7 ug/dL (ref 5.1–11.9)
TSH: 15.23 mIU/L — ABNORMAL HIGH (ref 0.40–4.50)

## 2019-04-09 MED ORDER — LEVOTHYROXINE SODIUM 125 MCG PO TABS: 125.0000 ug | ORAL_TABLET | Freq: Every day | ORAL | 0 refills | Status: DC

## 2019-04-09 MED ORDER — ROSUVASTATIN CALCIUM 10 MG PO TABS: 10.0000 mg | ORAL_TABLET | Freq: Every day | ORAL | 0 refills | Status: DC

## 2019-04-09 NOTE — Telephone Encounter (Signed)
[name] with [facility] drug store need clarification on  Medication that was called in yesterday

## 2019-04-09 NOTE — Progress Notes (Signed)
Sent in Rx for Hypothyroidism with increase in Levothyroxine from 166mcg to 143mcg and started on Rosuvastatin 10mg  due to mixed hyperlipidemia.  Will plan to repeat labs in 3 months.

## 2019-04-09 NOTE — Progress Notes (Signed)
Thyroid levels went from 12.42 (3 months ago) to 15.23.  Have sent in an increased dose for Levothyroxine going from 167mcg to 129mcg daily.  Her lipid profile was high and I have sent in Rosuvastatin to take 1 tablet daily.  She should be eating a heart healthy diet and working on increasing her exercise daily or every other day, going no more than 2 days in a row without some type of exercise.  Will plan to see her back in 3 months and repeat her labs.

## 2019-04-21 DIAGNOSIS — Z79899 Other long term (current) drug therapy: Secondary | ICD-10-CM | POA: Diagnosis not present

## 2019-04-21 DIAGNOSIS — L408 Other psoriasis: Secondary | ICD-10-CM | POA: Diagnosis not present

## 2019-05-01 ENCOUNTER — Other Ambulatory Visit: Payer: Self-pay

## 2019-05-01 DIAGNOSIS — F410 Panic disorder [episodic paroxysmal anxiety] without agoraphobia: Secondary | ICD-10-CM

## 2019-05-01 MED ORDER — CLONAZEPAM 1 MG PO TABS: ORAL_TABLET | ORAL | 0 refills | Status: DC

## 2019-05-01 NOTE — Telephone Encounter (Signed)
Personally reviewed [place] today, 05/01/19.  According to PMP aware, pt last received a refill on 04/02/2019.  Refill of her controlled substance will be provided today on 05/01/2019.

## 2019-05-05 DIAGNOSIS — L408 Other psoriasis: Secondary | ICD-10-CM | POA: Diagnosis not present

## 2019-05-27 ENCOUNTER — Other Ambulatory Visit: Payer: Self-pay | Admitting: Family Medicine

## 2019-05-27 DIAGNOSIS — F410 Panic disorder [episodic paroxysmal anxiety] without agoraphobia: Secondary | ICD-10-CM

## 2019-05-27 NOTE — Telephone Encounter (Signed)
Requested medication (s) are due for refill today: yes  Requested medication (s) are on the active medication list: yes  Last refill:  05/01/2019  Future visit scheduled: yes  Notes to clinic:  this refill cannot be delegated    Requested Prescriptions  Pending Prescriptions Disp Refills   clonazePAM (KLONOPIN) 1 MG tablet 75 tablet 0    Sig: TAKE ONE TABLET BY MOUTH EVERY MORNING, ONE-HALF TABLET EVERY AFTERNOON & ONE TABLET AT BEDTIME      Not Delegated - Psychiatry:  Anxiolytics/Hypnotics Failed - 05/27/2019 10:42 AM      Failed - This refill cannot be delegated      Failed - Urine Drug Screen completed in last 360 days.      Passed - Valid encounter within last 6 months    Recent Outpatient Visits           1 month ago Need for Streptococcus pneumoniae vaccination   [facility], Lupita Raider, FNP   4 months ago COPD (chronic obstructive pulmonary disease) with chronic bronchitis [facility])   [place], DO   7 months ago Benign essential HTN   [facility] Mikey College, NP   1 year ago Strep pharyngitis   [facility] Mikey College, NP   1 year ago Adult hypothyroidism   [facility] Merrilyn Puma, Jerrel Ivory, NP       Future Appointments             In 1 month [place], Lupita Raider, [facility], [facility]

## 2019-05-27 NOTE — Telephone Encounter (Signed)
Medication Refill - Medication: clonazepam   Has the patient contacted their pharmacy? Yes.   (Agent: If no, request that the patient contact the pharmacy for the refill.) (Agent: If yes, when and what did the pharmacy advise?)  Preferred Pharmacy (with phone number or street name):  [place], [address], [address]  [place] [address]  Phone: [phone] Fax: [phone]  Not a 24 hour pharmacy; exact hours not known.     Agent: Please be advised that RX refills may take up to 3 business days. We ask that you follow-up with your pharmacy.

## 2019-05-28 MED ORDER — CLONAZEPAM 1 MG PO TABS: ORAL_TABLET | ORAL | 0 refills | Status: DC

## 2019-06-23 ENCOUNTER — Other Ambulatory Visit: Payer: Self-pay | Admitting: Family Medicine

## 2019-06-23 DIAGNOSIS — F410 Panic disorder [episodic paroxysmal anxiety] without agoraphobia: Secondary | ICD-10-CM

## 2019-06-23 NOTE — Telephone Encounter (Signed)
Medication Refill - Medication:  clonazePAM (KLONOPIN) 1 MG tablet  Has the patient contacted their pharmacy? Yes advised to call office.   Preferred Pharmacy (with phone number or street name):  [place], [place] Phone:  [phone]  Fax:  [phone]      Agent: Please be advised that RX refills may take up to 3 business days. We ask that you follow-up with your pharmacy.

## 2019-06-23 NOTE — Telephone Encounter (Signed)
Requested medication (s) are due for refill today: yes  Requested medication (s) are on the active medication list: yes  Last refill: 05/28/2019  Future visit scheduled:yes  Notes to clinic:  this refill cannot be delegated    Requested Prescriptions  Pending Prescriptions Disp Refills   clonazePAM (KLONOPIN) 1 MG tablet 75 tablet 0    Sig: TAKE ONE TABLET BY MOUTH EVERY MORNING, ONE-HALF TABLET EVERY AFTERNOON & ONE TABLET AT BEDTIME      Not Delegated - Psychiatry:  Anxiolytics/Hypnotics Failed - 06/23/2019  9:56 AM      Failed - This refill cannot be delegated      Failed - Urine Drug Screen completed in last 360 days.      Passed - Valid encounter within last 6 months    Recent Outpatient Visits           2 months ago Need for Streptococcus pneumoniae vaccination   [facility], Lupita Raider, FNP   5 months ago COPD (chronic obstructive pulmonary disease) with chronic bronchitis [facility])   [place], DO   8 months ago Benign essential HTN   [facility] Mikey College, NP   1 year ago Strep pharyngitis   [facility] Mikey College, NP   1 year ago Adult hypothyroidism   [facility] Merrilyn Puma, Jerrel Ivory, NP       Future Appointments             In 2 weeks Malfi, Lupita Raider, [facility], [facility]

## 2019-06-24 MED ORDER — CLONAZEPAM 1 MG PO TABS: ORAL_TABLET | ORAL | 0 refills | Status: DC

## 2019-06-24 NOTE — Telephone Encounter (Signed)
Personally reviewed [place] today, 06/24/19.  According to PMP aware, pt last received a refill on 05/29/2019.  Refill of her controlled substance will be provided today.

## 2019-06-26 ENCOUNTER — Other Ambulatory Visit: Payer: Self-pay | Admitting: Family Medicine

## 2019-06-26 DIAGNOSIS — E039 Hypothyroidism, unspecified: Secondary | ICD-10-CM

## 2019-06-26 DIAGNOSIS — E782 Mixed hyperlipidemia: Secondary | ICD-10-CM

## 2019-06-26 MED ORDER — ROSUVASTATIN CALCIUM 10 MG PO TABS: 10.0000 mg | ORAL_TABLET | Freq: Every day | ORAL | 0 refills | Status: DC

## 2019-06-26 MED ORDER — LEVOTHYROXINE SODIUM 125 MCG PO TABS: 125.0000 ug | ORAL_TABLET | Freq: Every day | ORAL | 0 refills | Status: DC

## 2019-06-26 NOTE — Telephone Encounter (Signed)
Appointment 07/10/19 for follow up - Rx RF per protocol- PCP aware of lab results

## 2019-06-26 NOTE — Telephone Encounter (Signed)
Copied from [place] [id number]. Topic: Quick Communication - Rx Refill/Question >> Jun 26, 2019 10:17 AM Leward Quan A wrote: Medication: rosuvastatin (CRESTOR) 10 MG tablet, levothyroxine (SYNTHROID) 125 MCG tablet  Has the patient contacted their pharmacy? Yes.   (Agent: If no, request that the patient contact the pharmacy for the refill.) (Agent: If yes, when and what did the pharmacy advise?)  Preferred Pharmacy (with phone number or street name): [place], [place]  Phone:  [phone] Fax:  [phone]     Agent: Please be advised that RX refills may take up to 3 business days. We ask that you follow-up with your pharmacy.

## 2019-07-10 ENCOUNTER — Encounter: Payer: Self-pay | Admitting: Family Medicine

## 2019-07-10 ENCOUNTER — Ambulatory Visit (INDEPENDENT_AMBULATORY_CARE_PROVIDER_SITE_OTHER): Payer: PPO | Admitting: Family Medicine

## 2019-07-10 ENCOUNTER — Other Ambulatory Visit: Payer: Self-pay

## 2019-07-10 VITALS — BP 129/61 | HR 76 | Temp 97.1°F | Ht 63.0 in | Wt 202.8 lb

## 2019-07-10 DIAGNOSIS — R8290 Unspecified abnormal findings in urine: Secondary | ICD-10-CM

## 2019-07-10 DIAGNOSIS — R5383 Other fatigue: Secondary | ICD-10-CM | POA: Insufficient documentation

## 2019-07-10 DIAGNOSIS — I10 Essential (primary) hypertension: Secondary | ICD-10-CM

## 2019-07-10 DIAGNOSIS — E782 Mixed hyperlipidemia: Secondary | ICD-10-CM

## 2019-07-10 DIAGNOSIS — E039 Hypothyroidism, unspecified: Secondary | ICD-10-CM | POA: Diagnosis not present

## 2019-07-10 LAB — POCT URINALYSIS DIPSTICK
Bilirubin, UA: NEGATIVE
Blood, UA: NEGATIVE
Glucose, UA: NEGATIVE
Ketones, UA: NEGATIVE
Nitrite, UA: NEGATIVE
Protein, UA: NEGATIVE
Spec Grav, UA: 1.01 (ref 1.010–1.025)
Urobilinogen, UA: 0.2 E.U./dL
pH, UA: 5 (ref 5.0–8.0)

## 2019-07-10 NOTE — Patient Instructions (Addendum)
Have your lab results completed today.  As we discussed, if your thyroid labs continued to be elevated, we may look into a thyroid ultrasound and referral to endocrinology.  Please think about the sleep study considering your daytime fatigue and not sleeping well at night.  I have included information on having a good night sleep below that may help improve your daytime fatigue and difficulty falling asleep.  Try to get exercise a minimum of 30 minutes per day at least 5 days per week as well as  adequate water intake all while measuring blood pressure a few times per week.  Keep a blood pressure log and bring back to clinic at your next visit.  If your readings are consistently over 140/90 to contact our office/send me a MyChart message and we will see you sooner.  Can try DASH and Mediterranean diet options, avoiding processed foods, lowering sodium intake, avoiding pork products, and eating a plant based diet for optimal health.  Sleep hygiene is the single most effective treatment for sleep issues, but it is hard work.  Tips for a good night's sleep:  -Keep sleep environment comfortable and conducive to sleep -Keep regular sleep schedule 7 nights a week -Avoiding naps during the day -Avoiding going to bed until drowsy and ready to sleep, not trying to sleep, and not watching the clock -Get out of bed if not asleep within 15-20 minutes and returning only when drowsy -Avoiding caffeine, nicotine, alcohol, and other substances that interfere with sleep before bedtime -Take an hour before your set bedtime and start to wind down: bath/shower, no more TV or phone (the blue light can interfere with sleeping), listen to soothing music, or meditation -No TV in your bedroom -Exercising regularly, at least 6 hours before sleep. Yoga and Tai Chi can improve sleep quality  There are a lot of books and apps that may help guide you with any of the following:   -Progressive muscle relaxation (involves  methodical tension and relaxation of different Muscle groups throughout body)  Guided imagery  -YouTube - Gwynne Edinger has free videos on YouTube that can help with meditation and some   Abdominal breathing   Over the counter sleep aid one hour before bed- and gradually wean your use over 2-4 weeks  Some examples are : *Melatonin 5-10 mg *Sleepology (Can find on [employer]) taken according to packaging directions  There are a few online evidence based online programs, unfortunately they are not free.   Developed by a sleep expert who created a drug-free program for insomnia proven more effective than sleeping pills.  www.cbtforinsomnia.com Sleepio is an evidence-based digital sleep improvement program   www.sleepio.com SHUTi is designed to actively help retrain your body and mind for great sleep through six engaging Cognitive Behavioral Therapy for Insomnia strategy and learning sessions  BloggerCourse.[email or web address]   We will plan to see you back in 3 months for hypertension/anxiety f/u  You will receive a survey after today's visit either digitally by e-mail or paper by [place] mail. Your experiences and feedback matter to [name].  Please respond so we know how we are doing as we provide care for you.  Call us with any questions/concerns/needs.  It is my goal to be available to you for your health concerns.  Thanks for choosing me to be a partner in your healthcare needs!  Harlin Rain, FNP-C Family Nurse Practitioner [facility] Phone: [phone]

## 2019-07-10 NOTE — Assessment & Plan Note (Signed)
Controlled hypertension.  BP is at goal < 130/80.  Pt is working on lifestyle modifications.  Taking medications tolerating well without side effects. Complications: obesity, hyperlipidemia, anxiety, hypothyroidism  Plan: 1. Continue taking carvedilol 3.125mg  twice daily, diltiazem 120mg  daily, and lisinopril 10mg  daily 2. Obtain labs today  3. Encouraged heart healthy diet and increasing exercise to 30 minutes most days of the week, going no more than 2 days in a row without exercise. 4. Check BP 1-2 x per week at home, keep log, and bring to clinic at next appointment. 5. Follow up 3 months.

## 2019-07-10 NOTE — Assessment & Plan Note (Signed)
New labs to be drawn today since increasing levothyroxyine 3 months ago from 161mcg daily to 181mcg daily.  Confirmed is taking medications with water only, 1 hour before any food/beverages.    Plan: 1. Continue levothyroxine 135mcg daily 2. Labs to be drawn today to assess control of thyroid hormones 3. Discussed if continue to be elevated, will order thyroid ultrasound and referral to endocrinology for evaluation 4. Follow up in 3 months

## 2019-07-10 NOTE — Assessment & Plan Note (Signed)
Labs to be redrawn today for evaluation of lipid profile since being on rosuvastatin.  Will call with results to discuss treatment plan.

## 2019-07-10 NOTE — Progress Notes (Signed)
Subjective:    Patient ID: [name], female    DOB: 06/27/1950, 69 y.o.   MRN: [id number]  [name] is a 69 y.o. female presenting on 07/10/2019 for Hypertension and Anxiety   HPI  Hypertension - She is not checking BP at home or outside of clinic.    - Current medications: carvedilol 3.125mg  twice daily, diltiazem 120mg  daily, lisinopril 10mg  daily, tolerating well without side effects - She is not currently symptomatic. - Pt denies headache, lightheadedness, dizziness, changes in vision, chest tightness/pressure, palpitations, leg swelling, sudden loss of speech or loss of consciousness. - She  reports no regular exercise routine. - Her diet is moderate in salt, moderate in fat, and moderate in carbohydrates.  [name] reports that she has had continued to take her medications for anxiety with inadequate control.  Believes that she has more depression than anxiety, but "this is nothing new".  Has difficulty sleeping at night and increased daytime fatigue/tiredness.  Does not have anyone sharing a bed with her at home so is unsure if she snores at night or has apneas.  Unsure if she has any teeth grinding.  Denies history of OSA.    Depression screen [facility] 2/9 07/10/2019 04/08/2019 04/07/2019  Decreased Interest 1 1 1   Down, Depressed, Hopeless 3 1 3   PHQ - 2 Score 4 2 4   Altered sleeping 3 2 0  Tired, decreased energy 1 1 1   Change in appetite 1 3 0  Feeling bad or failure about yourself  0 0 0  Trouble concentrating 0 0 0  Moving slowly or fidgety/restless 0 0 0  Suicidal thoughts 0 0 0  PHQ-9 Score 9 8 5   Difficult doing work/chores Not difficult at all Not difficult at all Not difficult at all  Some recent data might be hidden    Social History   Tobacco Use  . Smoking status: Current Every Day Smoker    Packs/day: 1.00    Years: 35.00    Pack years: 35.00    Types: Cigarettes    Start date: 04/19/2018  . Smokeless tobacco: Never Used  . Tobacco comment: Previously  smoked 2 ppd.   Substance Use Topics  . Alcohol use: No  . Drug use: No    Review of Systems  Constitutional: Positive for fatigue. Negative for activity change, appetite change, chills, diaphoresis, fever and unexpected weight change.  HENT: Negative.   Eyes: Negative.   Respiratory: Negative.   Cardiovascular: Negative.   Gastrointestinal: Negative.   Endocrine: Negative.   Genitourinary: Negative.   Musculoskeletal: Negative.   Skin: Negative.   Allergic/Immunologic: Negative.   Neurological: Negative.   Hematological: Negative.   Psychiatric/Behavioral: Positive for dysphoric mood and sleep disturbance. Negative for agitation, behavioral problems, confusion, decreased concentration, hallucinations, self-injury and suicidal ideas. The patient is nervous/anxious. The patient is not hyperactive.    Per HPI unless specifically indicated above     Objective:    BP 129/61 (BP Location: Left Arm, Patient Position: Sitting, Cuff Size: Large)   Pulse 76   Temp (!) 97.1 F (36.2 C) (Temporal)   Ht 5\' 3"  (1.6 m)   Wt 202 lb 12.8 oz (92 kg)   SpO2 94%   BMI 35.92 kg/m   Wt Readings from Last 3 Encounters:  07/10/19 202 lb 12.8 oz (92 kg)  04/08/19 203 lb 6.4 oz (92.3 kg)  10/10/18 180 lb (81.6 kg)    Physical Exam Vitals reviewed.  Constitutional:  General: She is not in acute distress.    Appearance: Normal appearance. She is well-developed and well-groomed. She is obese. She is not ill-appearing or toxic-appearing.  HENT:     Head: Normocephalic.     Nose:     Comments: [name] is in place, covering mouth and nose Eyes:     General: Lids are normal. Vision grossly intact.        Right eye: No discharge.        Left eye: No discharge.     Extraocular Movements: Extraocular movements intact.     Conjunctiva/sclera: Conjunctivae normal.     Pupils: Pupils are equal, round, and reactive to light.  Cardiovascular:     Rate and Rhythm: Normal rate and regular  rhythm.     Pulses: Normal pulses.     Heart sounds: Normal heart sounds. No murmur. No friction rub. No gallop.   Pulmonary:     Effort: Pulmonary effort is normal. No respiratory distress.     Breath sounds: Normal breath sounds.  Musculoskeletal:     Right lower leg: No edema.     Left lower leg: No edema.  Skin:    General: Skin is warm and dry.     Capillary Refill: Capillary refill takes less than 2 seconds.  Neurological:     General: No focal deficit present.     Mental Status: She is alert and oriented to person, place, and time.     Cranial Nerves: No cranial nerve deficit.     Sensory: No sensory deficit.     Motor: No weakness.     Coordination: Coordination normal.     Gait: Gait normal.  Psychiatric:        Attention and Perception: Attention and perception normal.        Mood and Affect: Mood and affect normal.        Speech: Speech normal.        Behavior: Behavior normal. Behavior is cooperative.        Thought Content: Thought content normal.        Cognition and Memory: Cognition and memory normal.        Judgment: Judgment normal.     Results for orders placed or performed in visit on 07/10/19  POCT Urinalysis Dipstick  Result Value Ref Range   Color, UA Yellow    Clarity, UA clear    Glucose, UA Negative Negative   Bilirubin, UA negative    Ketones, UA negative    Spec Grav, UA 1.010 1.010 - 1.025   Blood, UA negative    pH, UA 5.0 5.0 - 8.0   Protein, UA Negative Negative   Urobilinogen, UA 0.2 0.2 or 1.0 E.U./dL   Nitrite, UA negative    Leukocytes, UA Small (1+) (A) Negative   Appearance     Odor        Assessment & Plan:   Problem List Items Addressed This Visit      Cardiovascular and Mediastinum   Benign essential HTN (Chronic)    Controlled hypertension.  BP is at goal < 130/80.  Pt is working on lifestyle modifications.  Taking medications tolerating well without side effects. Complications: obesity, hyperlipidemia, anxiety,  hypothyroidism  Plan: 1. Continue taking carvedilol 3.125mg  twice daily, diltiazem 120mg  daily, and lisinopril 10mg  daily 2. Obtain labs today  3. Encouraged heart healthy diet and increasing exercise to 30 minutes most days of the week, going no more than 2 days in a  row without exercise. 4. Check BP 1-2 x per week at home, keep log, and bring to clinic at next appointment. 5. Follow up 3 months.         Relevant Orders   POCT Urinalysis Dipstick (Completed)     Endocrine   Adult hypothyroidism - Primary (Chronic)    New labs to be drawn today since increasing levothyroxyine 3 months ago from 147mcg daily to 144mcg daily.  Confirmed is taking medications with water only, 1 hour before any food/beverages.    Plan: 1. Continue levothyroxine 184mcg daily 2. Labs to be drawn today to assess control of thyroid hormones 3. Discussed if continue to be elevated, will order thyroid ultrasound and referral to endocrinology for evaluation 4. Follow up in 3 months      Relevant Orders   Thyroid Panel With TSH     Other   Mixed hyperlipidemia (Chronic)    Labs to be redrawn today for evaluation of lipid profile since being on rosuvastatin.  Will call with results to discuss treatment plan.      Relevant Orders   Lipid Profile   Fatigue    Discussed sleep hygiene and daytime fatigue with difficulty sleeping.  Discussed concerns for OSA and recommended sleep study.  Patient declines at this time.       Other Visit Diagnoses    Abnormal urinalysis       Relevant Orders   Urine Culture      No orders of the defined types were placed in this encounter.    Follow up plan: Return in about 3 months (around 10/10/2019) for HTN/Anxiety f/u.   Harlin Rain, [place] [facility] Nurse Practitioner [facility] 07/10/2019, 9:51 AM

## 2019-07-10 NOTE — Assessment & Plan Note (Signed)
Discussed sleep hygiene and daytime fatigue with difficulty sleeping.  Discussed concerns for OSA and recommended sleep study.  Patient declines at this time.

## 2019-07-11 LAB — URINE CULTURE
MICRO NUMBER:: 10557500
Result:: NO GROWTH
SPECIMEN QUALITY:: ADEQUATE

## 2019-07-11 LAB — LIPID PANEL
Cholesterol: 108 mg/dL (ref <200)
HDL: 38 mg/dL — ABNORMAL LOW (ref >=50)
LDL Cholesterol (Calc): 47 mg/dL (calc)
Non-HDL Cholesterol (Calc): 70 mg/dL (calc) (ref <130)
Total CHOL/HDL Ratio: 2.8 (calc) (ref <5.0)
Triglycerides: 156 mg/dL — ABNORMAL HIGH (ref <150)

## 2019-07-11 LAB — THYROID PANEL WITH TSH
Free Thyroxine Index: 3.3 (ref 1.4–3.8)
T3 Uptake: 32 % (ref 22–35)
T4, Total: 10.4 ug/dL (ref 5.1–11.9)
TSH: 6.03 mIU/L — ABNORMAL HIGH (ref 0.40–4.50)

## 2019-07-13 ENCOUNTER — Other Ambulatory Visit: Payer: Self-pay | Admitting: Family Medicine

## 2019-07-13 DIAGNOSIS — E039 Hypothyroidism, unspecified: Secondary | ICD-10-CM

## 2019-07-13 MED ORDER — LEVOTHYROXINE SODIUM 137 MCG PO TABS: 137.0000 ug | ORAL_TABLET | Freq: Every day | ORAL | 1 refills | Status: DC

## 2019-07-20 ENCOUNTER — Ambulatory Visit
Admission: RE | Admit: 2019-07-20 | Discharge: 2019-07-20 | Disposition: A | Discharge status: Home / Self Care | Payer: PPO | Source: Ambulatory Visit | Attending: Family Medicine | Admitting: Family Medicine

## 2019-07-20 ENCOUNTER — Other Ambulatory Visit: Payer: Self-pay

## 2019-07-20 DIAGNOSIS — E039 Hypothyroidism, unspecified: Secondary | ICD-10-CM | POA: Insufficient documentation

## 2019-07-27 ENCOUNTER — Telehealth: Payer: Self-pay | Admitting: Family Medicine

## 2019-07-27 ENCOUNTER — Other Ambulatory Visit: Payer: Self-pay | Admitting: Family Medicine

## 2019-07-27 DIAGNOSIS — F410 Panic disorder [episodic paroxysmal anxiety] without agoraphobia: Secondary | ICD-10-CM

## 2019-07-27 MED ORDER — CLONAZEPAM 1 MG PO TABS: ORAL_TABLET | ORAL | 0 refills | Status: DC

## 2019-07-27 NOTE — Telephone Encounter (Signed)
Refill was sent to the pharmacy just now. Last refill was 06/26/2019, should have had enough medication to get through until today.  Should be calling for refill a few days before she is due to run out.  Thanks

## 2019-07-27 NOTE — Telephone Encounter (Signed)
The pt was notified that her prescription was sent to her pharmacy.

## 2019-07-27 NOTE — Telephone Encounter (Signed)
RX REFILL clonazePAM (KLONOPIN) 1 MG tablet [[id number]]  [place], [place] Phone:  [phone]  Fax:  [phone]     Patient has not had medication since Saturday, patient states she is shaking and needs medication asap. Call back [phone]

## 2019-07-27 NOTE — Progress Notes (Signed)
Personally reviewed [place] today, 07/27/19.  According to PMP aware, pt last received a refill on 06/26/2019.  Refill of her controlled substance will be provided today.

## 2019-08-25 ENCOUNTER — Other Ambulatory Visit: Payer: Self-pay | Admitting: Family Medicine

## 2019-08-25 DIAGNOSIS — F410 Panic disorder [episodic paroxysmal anxiety] without agoraphobia: Secondary | ICD-10-CM

## 2019-08-25 MED ORDER — CLONAZEPAM 1 MG PO TABS: ORAL_TABLET | ORAL | 0 refills | Status: DC

## 2019-08-25 NOTE — Telephone Encounter (Signed)
Pt request refill clonazePAM (KLONOPIN) 1 MG tablet  Pt states pharmacy told her they had faxed several times.  All her other meds are ready, and she was hoping to get this delivered with the others as well.   So pharmacy asked her to call   [place], [place] Phone:  [phone]  Fax:  [phone]

## 2019-08-25 NOTE — Telephone Encounter (Signed)
Personally reviewed [facility] today, 08/25/19.  According to PMP aware, pt last received a refill on 07/27/2019.  Refill of her controlled substance will be provided today.

## 2019-08-25 NOTE — Telephone Encounter (Signed)
Requested medication (s) are due for refill today: yes   Requested medication (s) are on the active medication list: yes  Future visit scheduled:  yes  Notes to clinic: Patient states that pharmacy doesn't have script and would like to have all her meds sent out together    Requested Prescriptions  Pending Prescriptions Disp Refills   clonazePAM (KLONOPIN) 1 MG tablet 75 tablet 0    Sig: TAKE ONE TABLET BY MOUTH EVERY MORNING, ONE-HALF TABLET EVERY AFTERNOON & ONE TABLET AT BEDTIME      Not Delegated - Psychiatry:  Anxiolytics/Hypnotics Failed - 08/25/2019 11:04 AM      Failed - This refill cannot be delegated      Failed - Urine Drug Screen completed in last 360 days.      Passed - Valid encounter within last 6 months    Recent Outpatient Visits           1 month ago Adult hypothyroidism   [facility], Lupita Raider, FNP   4 months ago Need for Streptococcus pneumoniae vaccination   [facility], Lupita Raider, FNP   7 months ago COPD (chronic obstructive pulmonary disease) with chronic bronchitis [facility])   [place], DO   10 months ago Benign essential HTN   [facility] Mikey College, NP   1 year ago Strep pharyngitis   [facility] Merrilyn Puma, Jerrel Ivory, NP

## 2019-08-27 ENCOUNTER — Other Ambulatory Visit: Payer: Self-pay | Admitting: Family Medicine

## 2019-08-27 DIAGNOSIS — J42 Unspecified chronic bronchitis: Secondary | ICD-10-CM

## 2019-08-27 NOTE — Telephone Encounter (Addendum)
Attempted to call patient on both numbers- home- rang continuous/moble- not reachable message. Rx refused- marked as filled less than 30 days on request

## 2019-09-22 ENCOUNTER — Other Ambulatory Visit: Payer: Self-pay | Admitting: Family Medicine

## 2019-09-22 DIAGNOSIS — F3341 Major depressive disorder, recurrent, in partial remission: Secondary | ICD-10-CM

## 2019-09-22 DIAGNOSIS — F410 Panic disorder [episodic paroxysmal anxiety] without agoraphobia: Secondary | ICD-10-CM

## 2019-09-22 DIAGNOSIS — E782 Mixed hyperlipidemia: Secondary | ICD-10-CM

## 2019-09-22 DIAGNOSIS — I10 Essential (primary) hypertension: Secondary | ICD-10-CM

## 2019-09-22 DIAGNOSIS — J42 Unspecified chronic bronchitis: Secondary | ICD-10-CM

## 2019-09-22 MED ORDER — CLONAZEPAM 1 MG PO TABS: ORAL_TABLET | ORAL | 0 refills | Status: DC

## 2019-09-22 NOTE — Telephone Encounter (Signed)
Requested medication (s) are due for refill today: yes  Requested medication (s) are on the active medication list: yes  Last refill:  04/08/19  Future visit scheduled: No  Notes to clinic:  Cr. 1.02 not noted in most recent lab note.    Requested Prescriptions  Pending Prescriptions Disp Refills   lisinopril (ZESTRIL) 10 MG tablet [Pharmacy Med Name: lisinopril 10 mg tablet] 90 tablet 1    Sig: TAKE ONE TABLET BY MOUTH ONCE DAILY      Cardiovascular:  ACE Inhibitors Failed - 09/22/2019  3:32 PM      Failed - Cr in normal range and within 180 days    Creat  Date Value Ref Range Status  04/08/2019 1.02 (H) 0.60 - 0.93 mg/dL Final    Comment:    For patients >3 years of age, the reference limit for Creatinine is approximately 13% higher for people identified as African-American. .           Passed - K in normal range and within 180 days    Potassium  Date Value Ref Range Status  04/08/2019 4.4 3.5 - 5.3 mmol/L Final  02/02/2013 3.7 3.5 - 5.1 mmol/L Final          Passed - Patient is not pregnant      Passed - Last BP in normal range    BP Readings from Last 1 Encounters:  07/10/19 129/61          Passed - Valid encounter within last 6 months    Recent Outpatient Visits           2 months ago Adult hypothyroidism   [facility], Lupita Raider, FNP   5 months ago Need for Streptococcus pneumoniae vaccination   [facility], Lupita Raider, FNP   8 months ago COPD (chronic obstructive pulmonary disease) with chronic bronchitis ([place])   [facility] Olin Hauser, DO   11 months ago Benign essential HTN   [facility] Merrilyn Puma, Jerrel Ivory, NP   1 year ago Strep pharyngitis   [facility] Merrilyn Puma, Jerrel Ivory, NP               Signed Prescriptions Disp Refills   rosuvastatin (CRESTOR) 10 MG tablet 90 tablet 0    Sig: TAKE ONE TABLET BY MOUTH ONCE DAILY       Cardiovascular:  Antilipid - Statins Failed - 09/22/2019  3:32 PM      Failed - HDL in normal range and within 360 days    HDL  Date Value Ref Range Status  07/10/2019 38 (L) > OR = 50 mg/dL Final  02/03/2015 36 (L) >39 mg/dL Final          Failed - Triglycerides in normal range and within 360 days    Triglycerides  Date Value Ref Range Status  07/10/2019 156 (H) <150 mg/dL Final          Passed - Total Cholesterol in normal range and within 360 days    Cholesterol, Total  Date Value Ref Range Status  02/03/2015 205 (H) 100 - 199 mg/dL Final   Cholesterol  Date Value Ref Range Status  07/10/2019 108 <200 mg/dL Final          Passed - LDL in normal range and within 360 days    LDL Cholesterol (Calc)  Date Value Ref Range Status  07/10/2019 47 mg/dL (calc) Final  Comment:    Reference range: <100 . Desirable range <100 mg/dL for primary prevention;   <70 mg/dL for patients with CHD or diabetic patients  with > or = 2 CHD risk factors. Marland Kitchen LDL-C is now calculated using the Martin-Hopkins  calculation, which is a validated novel method providing  better accuracy than the Friedewald equation in the  estimation of LDL-C.  Cresenciano Genre et al. Annamaria Helling. 2505;397(67): 2061-2068  (http://education.[facility].com/faq/FAQ164)           Passed - Patient is not pregnant      Passed - Valid encounter within last 12 months    Recent Outpatient Visits           2 months ago Adult hypothyroidism   [facility], Lupita Raider, FNP   5 months ago Need for Streptococcus pneumoniae vaccination   [facility], Lupita Raider, FNP   8 months ago COPD (chronic obstructive pulmonary disease) with chronic bronchitis [facility])   [facility] Olin Hauser, DO   11 months ago Benign essential HTN   [facility] Merrilyn Puma, Jerrel Ivory, NP   1 year ago Strep pharyngitis   [facility] Merrilyn Puma, Jerrel Ivory, NP                albuterol (VENTOLIN HFA) 108 (90 Base) MCG/ACT inhaler 25.5 g 0    Sig: INHALE 2 PUFFS BY MOUTH INTO THE LUNGS EVERY 6 HOURS AS NEEDED FOR WHEEZING OR SHORTNESS OF BREATH      Pulmonology:  Beta Agonists Failed - 09/22/2019  3:32 PM      Failed - One inhaler should last at least one month. If the patient is requesting refills earlier, contact the patient to check for uncontrolled symptoms.      Passed - Valid encounter within last 12 months    Recent Outpatient Visits           2 months ago Adult hypothyroidism   [facility], Lupita Raider, FNP   5 months ago Need for Streptococcus pneumoniae vaccination   [facility], Lupita Raider, FNP   8 months ago COPD (chronic obstructive pulmonary disease) with chronic bronchitis [facility])   [facility] Olin Hauser, DO   11 months ago Benign essential HTN   [facility] Merrilyn Puma, Jerrel Ivory, NP   1 year ago Strep pharyngitis   [facility] Merrilyn Puma, Jerrel Ivory, NP                citalopram (CELEXA) 40 MG tablet 90 tablet 0    Sig: TAKE ONE TABLET BY MOUTH ONCE DAILY      Psychiatry:  Antidepressants - SSRI Passed - 09/22/2019  3:32 PM      Passed - Completed PHQ-2 or PHQ-9 in the last 360 days.      Passed - Valid encounter within last 6 months    Recent Outpatient Visits           2 months ago Adult hypothyroidism   [facility], Lupita Raider, FNP   5 months ago Need for Streptococcus pneumoniae vaccination   [facility], Lupita Raider, FNP   8 months ago COPD (chronic obstructive pulmonary disease) with chronic bronchitis [facility])   [place], DO   11 months ago Benign essential HTN   [facility]  [place], NP   1 year ago Strep pharyngitis   [facility] Merrilyn Puma, Jerrel Ivory, NP                 diltiazem (CARDIZEM CD) 120 MG 24 hr capsule 90 capsule 0    Sig: TAKE ONE CAPSULE BY MOUTH ONCE DAILY      Cardiovascular:  Calcium Channel Blockers Passed - 09/22/2019  3:32 PM      Passed - Last BP in normal range    BP Readings from Last 1 Encounters:  07/10/19 129/61          Passed - Valid encounter within last 6 months    Recent Outpatient Visits           2 months ago Adult hypothyroidism   [facility], Lupita Raider, FNP   5 months ago Need for Streptococcus pneumoniae vaccination   [facility], Lupita Raider, FNP   8 months ago COPD (chronic obstructive pulmonary disease) with chronic bronchitis ([place])   [facility] Olin Hauser, DO   11 months ago Benign essential HTN   [facility] Merrilyn Puma, Jerrel Ivory, NP   1 year ago Strep pharyngitis   [facility] Merrilyn Puma, Jerrel Ivory, NP                carvedilol (COREG) 3.125 MG tablet 180 tablet 0    Sig: TAKE ONE TABLET BY MOUTH TWICE DAILY WITH A MEAL      Cardiovascular:  Beta Blockers Passed - 09/22/2019  3:32 PM      Passed - Last BP in normal range    BP Readings from Last 1 Encounters:  07/10/19 129/61          Passed - Last Heart Rate in normal range    Pulse Readings from Last 1 Encounters:  07/10/19 76          Passed - Valid encounter within last 6 months    Recent Outpatient Visits           2 months ago Adult hypothyroidism   [facility], Lupita Raider, FNP   5 months ago Need for Streptococcus pneumoniae vaccination   [facility], Lupita Raider, FNP   8 months ago COPD (chronic obstructive pulmonary disease) with chronic bronchitis [facility])   [place], DO   11 months ago Benign essential HTN   [facility] Merrilyn Puma, Jerrel Ivory, NP   1 year ago Strep pharyngitis   [facility]  Merrilyn Puma, Jerrel Ivory, NP

## 2019-09-22 NOTE — Telephone Encounter (Signed)
Personally reviewed [place] today, 09/22/19.  According to PMP aware, pt last received a refill on 08/25/2019.  Refill of her controlled substance will be provided today.

## 2019-09-22 NOTE — Telephone Encounter (Signed)
Requested medication (s) are due for refill today: yes  Requested medication (s) are on the active medication list: yes  Last refill:  08/25/2019  Future visit scheduled: yes  Notes to clinic:  this refill cannot be delegated    Requested Prescriptions  Pending Prescriptions Disp Refills   clonazePAM (KLONOPIN) 1 MG tablet 75 tablet 0    Sig: TAKE ONE TABLET BY MOUTH EVERY MORNING, ONE-HALF TABLET EVERY AFTERNOON & ONE TABLET AT BEDTIME      Not Delegated - Psychiatry:  Anxiolytics/Hypnotics Failed - 09/22/2019  8:46 AM      Failed - This refill cannot be delegated      Failed - Urine Drug Screen completed in last 360 days.      Passed - Valid encounter within last 6 months    Recent Outpatient Visits           2 months ago Adult hypothyroidism   [facility], Lupita Raider, FNP   5 months ago Need for Streptococcus pneumoniae vaccination   [facility], Lupita Raider, FNP   8 months ago COPD (chronic obstructive pulmonary disease) with chronic bronchitis [facility])   [place], DO   11 months ago Benign essential HTN   [facility] Merrilyn Puma, Jerrel Ivory, NP   1 year ago Strep pharyngitis   [facility] Merrilyn Puma, Jerrel Ivory, NP

## 2019-09-22 NOTE — Telephone Encounter (Signed)
Medication Refill - Medication: clonazepam   Has the patient contacted their pharmacy? Yes.   (Agent: If no, request that the patient contact the pharmacy for the refill.) (Agent: If yes, when and what did the pharmacy advise?)  Preferred Pharmacy (with phone number or street name):  [place]  [address] [address] [place] [address]  Phone: [phone] Fax: [phone]  Hours: Not open 24 hours     Agent: Please be advised that RX refills may take up to 3 business days. We ask that you follow-up with your pharmacy.

## 2019-09-22 NOTE — Telephone Encounter (Signed)
Requested Prescriptions  Pending Prescriptions Disp Refills  . rosuvastatin (CRESTOR) 10 MG tablet [Pharmacy Med Name: rosuvastatin 10 mg tablet] 90 tablet 0    Sig: TAKE ONE TABLET BY MOUTH ONCE DAILY     Cardiovascular:  Antilipid - Statins Failed - 09/22/2019  3:32 PM      Failed - HDL in normal range and within 360 days    HDL  Date Value Ref Range Status  07/10/2019 38 (L) > OR = 50 mg/dL Final  02/03/2015 36 (L) >39 mg/dL Final         Failed - Triglycerides in normal range and within 360 days    Triglycerides  Date Value Ref Range Status  07/10/2019 156 (H) <150 mg/dL Final         Passed - Total Cholesterol in normal range and within 360 days    Cholesterol, Total  Date Value Ref Range Status  02/03/2015 205 (H) 100 - 199 mg/dL Final   Cholesterol  Date Value Ref Range Status  07/10/2019 108 <200 mg/dL Final         Passed - LDL in normal range and within 360 days    LDL Cholesterol (Calc)  Date Value Ref Range Status  07/10/2019 47 mg/dL (calc) Final    Comment:    Reference range: <100 . Desirable range <100 mg/dL for primary prevention;   <70 mg/dL for patients with CHD or diabetic patients  with > or = 2 CHD risk factors. Marland Kitchen LDL-C is now calculated using the Martin-Hopkins  calculation, which is a validated novel method providing  better accuracy than the Friedewald equation in the  estimation of LDL-C.  Cresenciano Genre et al. Annamaria Helling. 5176;160(73): 2061-2068  (http://education.[facility].com/faq/FAQ164)          Passed - Patient is not pregnant      Passed - Valid encounter within last 12 months    Recent Outpatient Visits          2 months ago Adult hypothyroidism   [facility], Lupita Raider, FNP   5 months ago Need for Streptococcus pneumoniae vaccination   [facility], Lupita Raider, FNP   8 months ago COPD (chronic obstructive pulmonary disease) with chronic bronchitis ([place])   [facility]  Olin Hauser, DO   11 months ago Benign essential HTN   [facility] Merrilyn Puma, Jerrel Ivory, NP   1 year ago Strep pharyngitis   [facility] Merrilyn Puma, Jerrel Ivory, NP             . albuterol (VENTOLIN HFA) 108 (90 Base) MCG/ACT inhaler [Pharmacy Med Name: albuterol sulfate HFA 90 mcg/actuation aerosol inhaler] 25.5 g 0    Sig: INHALE 2 PUFFS BY MOUTH INTO THE LUNGS EVERY 6 HOURS AS NEEDED FOR WHEEZING OR SHORTNESS OF BREATH     Pulmonology:  Beta Agonists Failed - 09/22/2019  3:32 PM      Failed - One inhaler should last at least one month. If the patient is requesting refills earlier, contact the patient to check for uncontrolled symptoms.      Passed - Valid encounter within last 12 months    Recent Outpatient Visits          2 months ago Adult hypothyroidism   [place], FNP   5 months ago Need for Streptococcus pneumoniae vaccination   [facility], Lupita Raider, FNP   8  months ago COPD (chronic obstructive pulmonary disease) with chronic bronchitis ([place])   [place], DO   11 months ago Benign essential HTN   [facility] Merrilyn Puma, Jerrel Ivory, NP   1 year ago Strep pharyngitis   [facility] Merrilyn Puma, Jerrel Ivory, NP             . citalopram (CELEXA) 40 MG tablet [Pharmacy Med Name: citalopram 40 mg tablet] 90 tablet 0    Sig: TAKE ONE TABLET BY MOUTH ONCE DAILY     Psychiatry:  Antidepressants - SSRI Passed - 09/22/2019  3:32 PM      Passed - Completed PHQ-2 or PHQ-9 in the last 360 days.      Passed - Valid encounter within last 6 months    Recent Outpatient Visits          2 months ago Adult hypothyroidism   [facility], Lupita Raider, FNP   5 months ago Need for Streptococcus pneumoniae vaccination   [facility], Lupita Raider, FNP   8 months ago COPD (chronic  obstructive pulmonary disease) with chronic bronchitis ([place])   [facility] Olin [facility], DO   11 months ago Benign essential HTN   [facility] Merrilyn Puma, Jerrel Ivory, NP   1 year ago Strep pharyngitis   [facility] Merrilyn Puma, Jerrel Ivory, NP             . diltiazem (CARDIZEM CD) 120 MG 24 hr capsule [Pharmacy Med Name: diltiazem CD 120 mg capsule,extended release 24 hr] 90 capsule 0    Sig: TAKE ONE CAPSULE BY MOUTH ONCE DAILY     Cardiovascular:  Calcium Channel Blockers Passed - 09/22/2019  3:32 PM      Passed - Last BP in normal range    BP Readings from Last 1 Encounters:  07/10/19 129/61         Passed - Valid encounter within last 6 months    Recent Outpatient Visits          2 months ago Adult hypothyroidism   [facility], Lupita Raider, FNP   5 months ago Need for Streptococcus pneumoniae vaccination   [facility], Lupita Raider, FNP   8 months ago COPD (chronic obstructive pulmonary disease) with chronic bronchitis ([place])   [facility] Olin Hauser, DO   11 months ago Benign essential HTN   [facility] Merrilyn Puma, Jerrel Ivory, NP   1 year ago Strep pharyngitis   [facility] Merrilyn Puma, Jerrel Ivory, NP             . lisinopril (ZESTRIL) 10 MG tablet [Pharmacy Med Name: lisinopril 10 mg tablet] 90 tablet 1    Sig: TAKE ONE TABLET BY MOUTH ONCE DAILY     Cardiovascular:  ACE Inhibitors Failed - 09/22/2019  3:32 PM      Failed - Cr in normal range and within 180 days    Creat  Date Value Ref Range Status  04/08/2019 1.02 (H) 0.60 - 0.93 mg/dL Final    Comment:    For patients >43 years of age, the reference limit for Creatinine is approximately 13% higher for people identified as African-American. .          Passed - K in normal range and within 180 days    Potassium  Date Value  Ref Range Status  04/08/2019  4.4 3.5 - 5.3 mmol/L Final  02/02/2013 3.7 3.5 - 5.1 mmol/L Final         Passed - Patient is not pregnant      Passed - Last BP in normal range    BP Readings from Last 1 Encounters:  07/10/19 129/61         Passed - Valid encounter within last 6 months    Recent Outpatient Visits          2 months ago Adult hypothy[facility]   [facility], [facility] FNP   5 months ago Need for Streptococcus pneumoniae vaccination   [facility], Lup[facility], FNP   8 months ago COPD (chronic obstructive pulmonary disease) with chronic bronchitis ([place])   [facility] Olin Hauser, DO   11 months ago Benign essential HTN   [facility] Merrilyn Puma, Jerrel Ivory, NP   1 year ago Strep pharyngitis   [facility] Merrilyn Puma, Jerrel Ivory, NP             . carvedilol (COREG) 3.125 MG tablet [Pharmacy Med Name: carvedilol 3.125 mg tablet] 180 tablet 0    Sig: TAKE ONE TABLET BY MOUTH TWICE DAILY WITH A MEAL     Cardiovascular:  Beta Blockers Passed - 09/22/2019  3:32 PM      Passed - Last BP in normal range    BP Readings from Last 1 Encounters:  07/10/19 129/61         Passed - Last Heart Rate in normal range    Pulse Readings from Last 1 Encounters:  07/10/19 76         Passed - Valid encounter within last 6 months    Recent Outpatient Visits          2 months ago Adult hypothyroidism   [facility], Lupita Raider, FNP   5 months ago Need for Streptococcus pneumoniae vaccination   [facility], Lupita Ra[facility], FNP   8 months ago COPD (chronic obstructive pulmonary disease) with chronic bronchitis [facility])   [place], DO   11 months ago Benign essential HTN   [facility] Merrilyn Puma, Jerrel Ivory, NP   1 year ago Strep pharyngitis   [facility] Merrilyn Puma, Jerrel Ivory, NP

## 2019-10-21 ENCOUNTER — Other Ambulatory Visit: Payer: Self-pay | Admitting: Family Medicine

## 2019-10-21 DIAGNOSIS — F410 Panic disorder [episodic paroxysmal anxiety] without agoraphobia: Secondary | ICD-10-CM

## 2019-10-21 MED ORDER — CLONAZEPAM 1 MG PO TABS: ORAL_TABLET | ORAL | 0 refills | Status: DC

## 2019-10-21 NOTE — Telephone Encounter (Signed)
Personally reviewed [place] today, 10/21/19.  According to PMP aware, pt last received a refill on 09/23/2019.  Refill of her controlled substance will be provided today.

## 2019-10-21 NOTE — Telephone Encounter (Signed)
Requested medication (s) are due for refill today: Yes  Requested medication (s) are on the active medication list: Yes  Last refill:  09/22/19  Future visit scheduled: No  Notes to clinic:  See request.    Requested Prescriptions  Pending Prescriptions Disp Refills   clonazePAM (KLONOPIN) 1 MG tablet 75 tablet 0    Sig: TAKE ONE TABLET BY MOUTH EVERY MORNING, ONE-HALF TABLET EVERY AFTERNOON & ONE TABLET AT BEDTIME      Not Delegated - Psychiatry:  Anxiolytics/Hypnotics Failed - 10/21/2019  9:26 AM      Failed - This refill cannot be delegated      Failed - Urine Drug Screen completed in last 360 days.      Passed - Valid encounter within last 6 months    Recent Outpatient Visits           3 months ago Adult hypothyroidism   [facility], Lupita Raider, FNP   6 months ago Need for Streptococcus pneumoniae vaccination   [facility], Lupita Raider, FNP   9 months ago COPD (chronic obstructive pulmonary disease) with chronic bronchitis [facility])   [place], DO   1 year ago Benign essential HTN   [facility] [place]key [place], NP   1 year ago Strep pharyngitis   [facility] Merrilyn Puma, Jerrel Ivory, NP

## 2019-11-18 ENCOUNTER — Other Ambulatory Visit: Payer: Self-pay | Admitting: Family Medicine

## 2019-11-18 DIAGNOSIS — E039 Hypothyroidism, unspecified: Secondary | ICD-10-CM

## 2019-11-18 DIAGNOSIS — F410 Panic disorder [episodic paroxysmal anxiety] without agoraphobia: Secondary | ICD-10-CM

## 2019-11-18 NOTE — Telephone Encounter (Signed)
Personally reviewed [name] today, 11/18/19.  According to PMP aware, pt last received a refill on 10/21/2019.  Refill of her controlled substance will be provided today.

## 2019-11-18 NOTE — Telephone Encounter (Signed)
Requested medication (s) are due for refill today- yes  Requested medication (s) are on the active medication list -yes  Future visit scheduled -no  Last refill: 10/21/19  Notes to clinic: Attempted to call patient to schedule appointment/lab - no answer- unable to reach. Courtesy RF given on needed medications with note to schedule appointment. Request non delegated Rx  Requested Prescriptions  Pending Prescriptions Disp Refills   clonazePAM (KLONOPIN) 1 MG tablet [Pharmacy Med Name: clonazepam 1 mg tablet] 75 tablet 0    Sig: TAKE ONE TABLET BY MOUTH EVERY MORNING, ONE-HALF TABLET EVERY AFTERNOON, & ONE TABLET AT BEDTIME.      Not Delegated - Psychiatry:  Anxiolytics/Hypnotics Failed - 11/18/2019 10:25 AM      Failed - This refill cannot be delegated      Failed - Urine Drug Screen completed in last 360 days.      Passed - Valid encounter within last 6 months    Recent Outpatient Visits           4 months ago Adult hypothyroidism   [facility], Lupita Raider, FNP   7 months ago Need for Streptococcus pneumoniae vaccination   [facility], Lupita Raider, FNP   10 months ago COPD (chronic obstructive pulmonary disease) with chronic bronchitis ([place])   [facility] Olin Hauser, DO   1 year ago Benign essential HTN   [facility] Mikey College, NP   1 year ago Strep pharyngitis   [facility] Merrilyn Puma, Jerrel Ivory, NP               Signed Prescriptions Disp Refills   hydrOXYzine (ATARAX/VISTARIL) 25 MG tablet 30 tablet 0    Sig: TAKE ONE TABLET BY MOUTH AT BEDTIME      Ear, Nose, and Throat:  Antihistamines Passed - 11/18/2019 10:25 AM      Passed - Valid encounter within last 12 months    Recent Outpatient Visits           4 months ago Adult hypothyroidism   [facility], Lupita Raider, FNP   7 months ago Need for Streptococcus pneumoniae vaccination    [facility], Lupita Raider, FNP   10 months ago COPD (chronic obstructive pulmonary disease) with chronic bronchitis ([place])   [facility] Olin Hauser, DO   1 year ago Benign essential HTN   [facility] Mikey College, NP   1 year ago Strep pharyngitis   [facility] Merrilyn Puma, Jerrel Ivory, NP                busPIRone (BUSPAR) 7.5 MG tablet 90 tablet 0    Sig: TAKE ONE TABLET BY MOUTH THREE TIMES DAILY      Psychiatry: Anxiolytics/Hypnotics - Non-controlled Passed - 11/18/2019 10:25 AM      Passed - Valid encounter within last 6 months    Recent Outpatient Visits           4 months ago Adult hypothyroidism   [facility], Lupita Raider, FNP   7 months ago Need for Streptococcus pneumoniae vaccination   [facility], Lupita Raider, FNP   10 months ago COPD (chronic obstructive pulmonary disease) with chronic bronchitis [facility])   [facility] Olin Hauser, DO   1 year ago Benign essential HTN   [facility] Mikey College, NP   1 year ago Strep pharyngitis   [facility] Merrilyn Puma, Jerrel Ivory, NP               Refused Prescriptions Disp Refills   levothyroxine (SYNTHROID) 137 MCG tablet [Pharmacy Med Name: levothyroxine 137 mcg tablet] 90 tablet 0    Sig: TAKE ONE TABLET BY MOUTH EVERY MORNING BEFORE BREAKFAST      Endocrinology:  Hypothyroid Agents Failed - 11/18/2019 10:25 AM      Failed - TSH needs to be rechecked within 3 months after an abnormal result. Refill until TSH is due.      Failed - TSH in normal range and within 360 days    TSH  Date Value Ref Range Status  07/10/2019 6.03 (H) 0.40 - 4.50 mIU/L Final          Passed - Valid encounter within last 12 months    Recent Outpatient Visits           4 months ago Adult hypothyroidism   [facility], Lupita Raider,  FNP   7 months ago Need for Streptococcus pneumoniae vaccination   [facility], Lupita Raider, FNP   10 months ago COPD (chronic obstructive pulmonary disease) with chronic bronchitis ([place])   [facility] Olin Hauser, DO   1 year ago Benign essential HTN   [facility] Mikey College, NP   1 year ago Strep pharyngitis   [facility] Merrilyn Puma, Jerrel Ivory, NP                  Requested Prescriptions  Pending Prescriptions Disp Refills   clonazePAM (KLONOPIN) 1 MG tablet [Pharmacy Med Name: clonazepam 1 mg tablet] 75 tablet 0    Sig: TAKE ONE TABLET BY MOUTH EVERY MORNING, ONE-HALF TABLET EVERY AFTERNOON, & ONE TABLET AT BEDTIME.      Not Delegated - Psychiatry:  Anxiolytics/Hypnotics Failed - 11/18/2019 10:25 AM      Failed - This refill cannot be delegated      Failed - Urine Drug Screen completed in last 360 days.      Passed - Valid encounter within last 6 months    Recent Outpatient Visits           4 months ago Adult hypothyroidism   [facility], Lupita Raider, FNP   7 months ago Need for Streptococcus pneumoniae vaccination   [facility], Lupita Raider, FNP   10 months ago COPD (chronic obstructive pulmonary disease) with chronic bronchitis ([place])   [facility] Olin Hauser, DO   1 year ago Benign essential HTN   [facility] Mikey College, NP   1 year ago Strep pharyngitis   [facility] Merrilyn Puma, Jerrel Ivory, NP               Signed Prescriptions Disp Refills   hydrOXYzine (ATARAX/VISTARIL) 25 MG tablet 30 tablet 0    Sig: TAKE ONE TABLET BY MOUTH AT BEDTIME      Ear, Nose, and Throat:  Antihistamines Passed - 11/18/2019 10:25 AM      Passed - Valid encounter within last 12 months    Recent Outpatient Visits           4 months ago Adult hypothyroidism   [facility], Lupita Raider, [place]  7 months ago Need for Streptococcus pneumoniae vaccination   [facility], Lupita Raider, FNP   10 months ago COPD (chronic obstructive pulmonary disease) with chronic bronchitis ([place])   [facility] Olin Hauser, DO   1 year ago Benign essential HTN   [facility] Mikey College, NP   1 year ago Strep pharyngitis   [facility] Merrilyn Puma, Jerrel Ivory, NP                busPIRone (BUSPAR) 7.5 MG tablet 90 tablet 0    Sig: TAKE ONE TABLET BY MOUTH THREE TIMES DAILY      Psychiatry: Anxiolytics/Hypnotics - Non-controlled Passed - 11/18/2019 10:25 AM      Passed - Valid encounter within last 6 months    Recent Outpatient Visits           4 months ago Adult hypothyroidism   [facility], Lupita Raider, FNP   7 months ago Need for Streptococcus pneumoniae vaccination   [facility], Lupita Ra[facility], FNP   10 months ago COPD (chronic obstructive pulmonary disease) with chronic bronchitis ([place])   [facility] Olin Hauser, DO   1 year ago Benign essential HTN   [facility] Mikey College, NP   1 year ago Strep pharyngitis   [facility] Merrilyn Puma, Jerrel Ivory, NP               Refused Prescriptions Disp Refills   levothyroxine (SYNTHROID) 137 MCG tablet [Pharmacy Med Name: levothyroxine 137 mcg tablet] 90 tablet 0    Sig: TAKE ONE TABLET BY MOUTH EVERY MORNING BEFORE BREAKFAST      Endocrinology:  Hypothyroid Agents Failed - 11/18/2019 10:25 AM      Failed - TSH needs to be rechecked within 3 months after an abnormal result. Refill until TSH is due.      Failed - TSH in normal range and within 360 days    TSH  Date Value Ref Range Status  07/10/2019 6.03 (H) 0.40 - 4.50 mIU/L Final          Passed - Valid encounter within last 12 months    Recent Outpatient  Visits           4 months ago Adult hypothyroidism   [facility], Lupita Raider, FNP   7 months ago Need for Streptococcus pneumoniae vaccination   [facility], Lupita Raider, FNP   10 months ago COPD (chronic obstructive pulmonary disease) with chronic bronchitis [facility])   [place], DO   1 year ago Benign essential HTN   [facility] Mikey College, NP   1 year ago Strep pharyngitis   [facility] Merrilyn Puma, Jerrel Ivory, NP

## 2019-11-18 NOTE — Telephone Encounter (Signed)
Attempted to call patient to schedule appointment- due labs also. No answer at home number and cell number "not reachable" message. Note appointment needed attached to Rx. Courtesy #[phone] day given

## 2019-12-03 ENCOUNTER — Other Ambulatory Visit: Payer: Self-pay

## 2019-12-03 ENCOUNTER — Ambulatory Visit
Admission: RE | Admit: 2019-12-03 | Discharge: 2019-12-03 | Disposition: A | Discharge status: Home / Self Care | Payer: PPO | Source: Ambulatory Visit | Attending: Family Medicine | Admitting: Family Medicine

## 2019-12-03 ENCOUNTER — Encounter: Payer: Self-pay | Admitting: Family Medicine

## 2019-12-03 ENCOUNTER — Ambulatory Visit
Admission: EM | Admit: 2019-12-03 | Discharge: 2019-12-03 | Disposition: A | Discharge status: Home / Self Care | Payer: PPO | Attending: Family Medicine | Admitting: Family Medicine

## 2019-12-03 ENCOUNTER — Ambulatory Visit (INDEPENDENT_AMBULATORY_CARE_PROVIDER_SITE_OTHER): Payer: PPO | Admitting: Family Medicine

## 2019-12-03 VITALS — BP 137/63 | HR 79 | Temp 98.4°F | Resp 18 | Ht 63.0 in | Wt 204.0 lb

## 2019-12-03 DIAGNOSIS — E039 Hypothyroidism, unspecified: Secondary | ICD-10-CM | POA: Diagnosis not present

## 2019-12-03 DIAGNOSIS — E782 Mixed hyperlipidemia: Secondary | ICD-10-CM

## 2019-12-03 DIAGNOSIS — Z23 Encounter for immunization: Secondary | ICD-10-CM

## 2019-12-03 DIAGNOSIS — W19XXXA Unspecified fall, initial encounter: Secondary | ICD-10-CM | POA: Diagnosis not present

## 2019-12-03 DIAGNOSIS — M79601 Pain in right arm: Secondary | ICD-10-CM | POA: Insufficient documentation

## 2019-12-03 DIAGNOSIS — I10 Essential (primary) hypertension: Secondary | ICD-10-CM | POA: Diagnosis not present

## 2019-12-03 DIAGNOSIS — S40021A Contusion of right upper arm, initial encounter: Secondary | ICD-10-CM | POA: Diagnosis not present

## 2019-12-03 DIAGNOSIS — M7989 Other specified soft tissue disorders: Secondary | ICD-10-CM | POA: Diagnosis not present

## 2019-12-03 NOTE — Patient Instructions (Signed)
We have given you your influenza vaccine today.  Have your labs drawn and have your right upper arm xray taken.  We will call you when we receive the results.  As we discussed, you can take ibuprofen, according to packaging directions, for muscle soreness  We will plan to see you back if your symptoms worsen or fail to improve  You will receive a survey after today's visit either digitally by e-mail or paper by USPS mail. Your experiences and feedback matter to [name].  Please respond so we know how we are doing as we provide care for you.  Call us with any questions/concerns/needs.  It is my goal to be available to you for your health concerns.  Thanks for choosing me to be a partner in your healthcare needs!  Harlin Rain, FNP-C Family Nurse Practitioner [facility] Phone: [phone]

## 2019-12-03 NOTE — Assessment & Plan Note (Signed)
Pt > age 69.  Needs annual influenza vaccine.  VIS provided.  Plan: 1. Administer high dose fluzone today.  

## 2019-12-03 NOTE — Progress Notes (Signed)
Subjective:    Patient ID: [name], female    DOB: Aug 16, 1950, 69 y.o.   MRN: [id number]  [name] is a 69 y.o. female presenting on 12/03/2019 for Fall   HPI  [name] presents to clinic for concerns of a recent fall x 1 week ago.  Reports she went to the bathroom in the middle of the night, when she was going to get back into bed, she had fallen, she thought she was closer to her bed than she was and hit her right inner arm.  Has a bruise on her right inner arm and pain with full ROM and right inner breast pain.  Denies any swelling, numbness, tingling, weakness, drainage, fevers, hitting her head, LOC, or being dizzy before falling.  Depression screen [facility] 2/9 12/03/2019 07/10/2019 04/08/2019  Decreased Interest 1 1 1   Down, Depressed, Hopeless 2 3 1   PHQ - 2 Score 3 4 2   Altered sleeping 2 3 2   Tired, decreased energy 1 1 1   Change in appetite 1 1 3   Feeling bad or failure about yourself  0 0 0  Trouble concentrating 0 0 0  Moving slowly or fidgety/restless 0 0 0  Suicidal thoughts 0 0 0  PHQ-9 Score 7 9 8   Difficult doing work/chores Not difficult at all Not difficult at all Not difficult at all  Some recent data might be hidden    Social History   Tobacco Use  . Smoking status: Current Every Day Smoker    Packs/day: 1.00    Years: 35.00    Pack years: 35.00    Types: Cigarettes    Start date: 04/19/2018  . Smokeless tobacco: Never Used  . Tobacco comment: Previously smoked 2 ppd.   Vaping Use  . Vaping Use: Never used  Substance Use Topics  . Alcohol use: No  . Drug use: No    Review of Systems  Constitutional: Negative.   HENT: Negative.   Eyes: Negative.   Respiratory: Negative.   Cardiovascular: Negative.   Gastrointestinal: Negative.   Endocrine: Negative.   Genitourinary: Negative.   Musculoskeletal: Positive for myalgias. Negative for arthralgias, back pain, gait problem, joint swelling, neck pain and neck stiffness.  Skin: Negative.     Allergic/Immunologic: Negative.   Neurological: Negative.   Hematological: Negative.   Psychiatric/Behavioral: Negative.    Per HPI unless specifically indicated above     Objective:    BP 137/63 (BP Location: Left Arm, Patient Position: Sitting, Cuff Size: Large)   Pulse 79   Temp 98.4 F (36.9 C) (Oral)   Resp 18   Ht 5\' 3"  (1.6 m)   Wt 204 lb (92.5 kg)   SpO2 94%   BMI 36.14 kg/m   Wt Readings from Last 3 Encounters:  12/03/19 204 lb (92.5 kg)  07/10/19 202 lb 12.8 oz (92 kg)  04/08/19 203 lb 6.4 oz (92.3 kg)    Physical Exam Vitals and nursing note reviewed.  Constitutional:      General: She is not in acute distress.    Appearance: Normal appearance. She is well-developed and well-groomed. She is obese. She is not ill-appearing or toxic-appearing.  HENT:     Head: Normocephalic and atraumatic.     Nose:     Comments: [name] is in place, covering mouth and nose. Eyes:     General: Lids are normal. Vision grossly intact.        Right eye: No discharge.  Left eye: No discharge.     Extraocular Movements: Extraocular movements intact.     Conjunctiva/sclera: Conjunctivae normal.     Pupils: Pupils are equal, round, and reactive to light.  Cardiovascular:     Rate and Rhythm: Normal rate and regular rhythm.     Pulses: Normal pulses.     Heart sounds: Normal heart sounds. No murmur heard.  No friction rub. No gallop.   Pulmonary:     Effort: Pulmonary effort is normal. No respiratory distress.     Breath sounds: Normal breath sounds.  Musculoskeletal:        General: No swelling or tenderness.     Right lower leg: No edema.     Left lower leg: No edema.     Comments: Right upper arm tenderness with deep palpation and full active ROM.  Negative pain with passive ROM.  Skin:    General: Skin is warm and dry.     Capillary Refill: Capillary refill takes less than 2 seconds.     Findings: Bruising present.     Comments: Right inner upper arm with  bruising, approx 8 cm, with central ecchymotic area turning yellow, all in process of resolving.  No s/s of infection or swelling.  Neurological:     General: No focal deficit present.     Mental Status: She is alert and oriented to person, place, and time.  Psychiatric:        Attention and Perception: Attention and perception normal.        Mood and Affect: Mood and affect normal.        Speech: Speech normal.        Behavior: Behavior normal. Behavior is cooperative.        Thought Content: Thought content normal.        Cognition and Memory: Cognition and memory normal.        Judgment: Judgment normal.    Results for orders placed or performed in visit on 07/10/19  Urine Culture   Specimen: Urine  Result Value Ref Range   MICRO NUMBER: [id number]    SPECIMEN QUALITY: Adequate    Sample Source URINE, CLEAN CATCH    STATUS: FINAL    Result: No Growth   Thyroid Panel With TSH  Result Value Ref Range   T3 Uptake 32 22 - 35 %   T4, Total 10.4 5.1 - 11.9 mcg/dL   Free Thyroxine Index 3.3 1.4 - 3.8   TSH 6.03 (H) 0.40 - 4.50 mIU/L  Lipid panel  Result Value Ref Range   Cholesterol 108 <200 mg/dL   HDL 38 (L) > OR = 50 mg/dL   Triglycerides 156 (H) <150 mg/dL   LDL Cholesterol (Calc) 47 mg/dL (calc)   Total CHOL/HDL Ratio 2.8 <5.0 (calc)   Non-HDL Cholesterol (Calc) 70 <130 mg/dL (calc)  POCT Urinalysis Dipstick  Result Value Ref Range   Color, UA Yellow    Clarity, UA clear    Glucose, UA Negative Negative   Bilirubin, UA negative    Ketones, UA negative    Spec Grav, UA 1.010 1.010 - 1.025   Blood, UA negative    pH, UA 5.0 5.0 - 8.0   Protein, UA Negative Negative   Urobilinogen, UA 0.2 0.2 or 1.0 E.U./dL   Nitrite, UA negative    Leukocytes, UA Small (1+) (A) Negative   Appearance     Odor        Assessment & Plan:   Problem  List Items Addressed This Visit      Cardiovascular and Mediastinum   Benign essential HTN (Chronic)   Relevant Orders   CBC with  Differential   COMPLETE METABOLIC PANEL WITH GFR     Endocrine   Adult hypothyroidism (Chronic)    Labs to be redrawn for evaluation of thyroid function today      Relevant Orders   TSH + free T4     Other   Mixed hyperlipidemia (Chronic)    Status unknown.  Recheck labs.  Continue meds without changes today.  Refills provided. Followup after labs.       Relevant Orders   Lipid Profile   Right arm pain - Primary    Right arm pain s/p fall, likely muscular based on exam.  Will have right humeral xray taken for evaluation for any fracture/avulsion.  Discussed can take ibuprofen, according to packaging directions, for discomfort.      Relevant Orders   DG Humerus Right   Fall    Fall x 1 week ago, injuring right upper arm, likely muscular, will have right humeral xray taken for evaluation/imaging.  Denies being dizzy or tripping over anything prior to fall.  Reports believes was closer to her bed than she was.  Encouraged night lighting or turning on a bedroom light when getting up to use the restroom at night.      Needs flu shot    Pt > age 3.  Needs annual influenza vaccine.  VIS provided.  Plan: 1. Administer high dose fluzone today.       Relevant Orders   Flu Vaccine QUAD High Dose(Fluad)      No orders of the defined types were placed in this encounter.   Follow up plan: Return if symptoms worsen or fail to improve.   Harlin Rain, [place] Family Nurse Practitioner [facility] 12/03/2019, 11:05 AM

## 2019-12-03 NOTE — Assessment & Plan Note (Signed)
Fall x 1 week ago, injuring right upper arm, likely muscular, will have right humeral xray taken for evaluation/imaging.  Denies being dizzy or tripping over anything prior to fall.  Reports believes was closer to her bed than she was.  Encouraged night lighting or turning on a bedroom light when getting up to use the restroom at night.

## 2019-12-03 NOTE — Assessment & Plan Note (Signed)
Right arm pain s/p fall, likely muscular based on exam.  Will have right humeral xray taken for evaluation for any fracture/avulsion.  Discussed can take ibuprofen, according to packaging directions, for discomfort.

## 2019-12-03 NOTE — Assessment & Plan Note (Signed)
Labs to be redrawn for evaluation of thyroid function today

## 2019-12-03 NOTE — Assessment & Plan Note (Signed)
Status unknown.  Recheck labs.  Continue meds without changes today.  Refills provided. Followup after labs.  

## 2019-12-04 ENCOUNTER — Other Ambulatory Visit: Payer: Self-pay | Admitting: Family Medicine

## 2019-12-04 DIAGNOSIS — E039 Hypothyroidism, unspecified: Secondary | ICD-10-CM

## 2019-12-04 DIAGNOSIS — J449 Chronic obstructive pulmonary disease, unspecified: Secondary | ICD-10-CM

## 2019-12-04 LAB — CBC WITH DIFFERENTIAL/PLATELET
Absolute Monocytes: 741 cells/uL (ref 200–950)
Basophils Absolute: 101 cells/uL (ref 0–200)
Basophils Relative: 1.3 %
Eosinophils Absolute: 289 cells/uL (ref 15–500)
Eosinophils Relative: 3.7 %
HCT: 50 % — ABNORMAL HIGH (ref 35.0–45.0)
Hemoglobin: 15.7 g/dL — ABNORMAL HIGH (ref 11.7–15.5)
Lymphs Abs: 2480 cells/uL (ref 850–3900)
MCH: 28.5 pg (ref 27.0–33.0)
MCHC: 31.4 g/dL — ABNORMAL LOW (ref 32.0–36.0)
MCV: 90.7 fL (ref 80.0–100.0)
MPV: 10.4 fL (ref 7.5–12.5)
Monocytes Relative: 9.5 %
Neutro Abs: 4189 cells/uL (ref 1500–7800)
Neutrophils Relative %: 53.7 %
Platelets: 228 10*3/uL (ref 140–400)
RBC: 5.51 10*6/uL — ABNORMAL HIGH (ref 3.80–5.10)
RDW: 12.6 % (ref 11.0–15.0)
Total Lymphocyte: 31.8 %
WBC: 7.8 10*3/uL (ref 3.8–10.8)

## 2019-12-04 LAB — COMPLETE METABOLIC PANEL WITH GFR
AG Ratio: 1.6 (calc) (ref 1.0–2.5)
ALT: 13 U/L (ref 6–29)
AST: 17 U/L (ref 10–35)
Albumin: 4 g/dL (ref 3.6–5.1)
Alkaline phosphatase (APISO): 86 U/L (ref 37–153)
BUN: 11 mg/dL (ref 7–25)
CO2: 37 mmol/L — ABNORMAL HIGH (ref 20–32)
Calcium: 9.4 mg/dL (ref 8.6–10.4)
Chloride: 98 mmol/L (ref 98–110)
Creat: 0.82 mg/dL (ref 0.60–0.93)
GFR, Est African American: 84 mL/min/{1.73_m2} (ref >=60)
GFR, Est Non African American: 72 mL/min/{1.73_m2} (ref >=60)
Globulin: 2.5 g/dL (calc) (ref 1.9–3.7)
Glucose, Bld: 104 mg/dL — ABNORMAL HIGH (ref 65–99)
Potassium: 3.9 mmol/L (ref 3.5–5.3)
Sodium: 143 mmol/L (ref 135–146)
Total Bilirubin: 0.6 mg/dL (ref 0.2–1.2)
Total Protein: 6.5 g/dL (ref 6.1–8.1)

## 2019-12-04 LAB — TSH+FREE T4: TSH W/REFLEX TO FT4: 7.49 mIU/L — ABNORMAL HIGH (ref 0.40–4.50)

## 2019-12-04 LAB — LIPID PANEL
Cholesterol: 112 mg/dL (ref <200)
HDL: 43 mg/dL — ABNORMAL LOW (ref >=50)
LDL Cholesterol (Calc): 48 mg/dL (calc)
Non-HDL Cholesterol (Calc): 69 mg/dL (calc) (ref <130)
Total CHOL/HDL Ratio: 2.6 (calc) (ref <5.0)
Triglycerides: 130 mg/dL (ref <150)

## 2019-12-04 LAB — T4, FREE: Free T4: 1.6 ng/dL (ref 0.8–1.8)

## 2019-12-04 MED ORDER — BREO ELLIPTA 200-25 MCG/INH IN AEPB: 1.0000 | INHALATION_SPRAY | Freq: Every day | RESPIRATORY_TRACT | 1 refills | Status: DC

## 2019-12-04 MED ORDER — LEVOTHYROXINE SODIUM 150 MCG PO TABS: 150.0000 ug | ORAL_TABLET | Freq: Every day | ORAL | 0 refills | Status: DC

## 2019-12-07 ENCOUNTER — Telehealth: Payer: Self-pay

## 2019-12-07 ENCOUNTER — Telehealth: Payer: Self-pay | Admitting: *Deleted

## 2019-12-07 NOTE — Telephone Encounter (Signed)
Olivia Mackie with [facility] Radiology calling to report Right Humerus xray has been read. Results in Epic. Apolonio Schneiders in the practice notified.

## 2019-12-07 NOTE — Telephone Encounter (Signed)
I contacted the patient to notify her of her nondisplaced proximal right humeral diaphyseal fracture. I informed her that the provider recommended that she be evaluated at [facility]. She declined that referral to [facility]. She said she doesn't like them and prefer to go to [place]. She said she seen Dr. Marry Guan and he done surgery on her hand in the past.

## 2019-12-07 NOTE — Chronic Care Management (AMB) (Signed)
  Chronic Care Management   Note  12/07/2019 Name: [name] MRN: [id number] DOB: 07-20-1950  [name] is a 69 y.o. year old female who is a primary care patient of Lorine Bears, Lupita Raider, FNP. I reached out to Jamal Maes by phone today in response to a referral sent by Ms. Fusaye D Tabb's PCP, [place], Lupita Raider, FNP.     Ms. Steward was given information about Chronic Care Management services today including:  1. CCM service includes personalized support from designated clinical staff supervised by her physician, including individualized plan of care and coordination with other care providers 2. 24/7 contact phone numbers for assistance for urgent and routine care needs. 3. Service will only be billed when office clinical staff spend 20 minutes or more in a month to coordinate care. 4. Only one practitioner may furnish and bill the service in a calendar month. 5. The patient may stop CCM services at any time (effective at the end of the month) by phone call to the office staff. 6. The patient will be responsible for cost sharing (co-pay) of up to 20% of the service fee (after annual deductible is met).  Patient did not agree to enrollment in care management services and does not wish to consider at this time.  Follow up plan: Patient declines engagement by the care management team. Appropriate care team members and provider have been notified via electronic communication. The patient has been provided with contact information for the care management team and has been advised to call with any health related questions or concerns.   [facility]  Direct Dial: [phone]

## 2019-12-08 ENCOUNTER — Other Ambulatory Visit: Payer: Self-pay | Admitting: Family Medicine

## 2019-12-08 DIAGNOSIS — S42391A Other fracture of shaft of right humerus, initial encounter for closed fracture: Secondary | ICD-10-CM

## 2019-12-08 NOTE — Telephone Encounter (Signed)
Referral put in as urgent ortho referral to [facility] Dr. Marry Guan.  Referral coordinator notified.

## 2019-12-08 NOTE — Telephone Encounter (Signed)
The pt was notified and verbalize understanding.

## 2019-12-17 ENCOUNTER — Other Ambulatory Visit: Payer: Self-pay | Admitting: Family Medicine

## 2019-12-17 DIAGNOSIS — F410 Panic disorder [episodic paroxysmal anxiety] without agoraphobia: Secondary | ICD-10-CM

## 2019-12-17 NOTE — Telephone Encounter (Signed)
Personally reviewed [name] today, 12/17/19.  According to PMP aware, pt last received a refill on 11/18/2019.  Refill of her controlled substance will be provided today.

## 2019-12-17 NOTE — Telephone Encounter (Signed)
Requested medication (s) are due for refill today: yes  Requested medication (s) are on the active medication list: yes  Last refill:  11/18/19 #75 0 refills  Future visit scheduled: no  Notes to clinic:  not delegated per protocol     Requested Prescriptions  Pending Prescriptions Disp Refills   clonazePAM (KLONOPIN) 1 MG tablet [Pharmacy Med Name: clonazepam 1 mg tablet] 75 tablet 0    Sig: TAKE ONE TABLET BY MOUTH EVERY MORNING, ONE-HALF TABLET EVERY AFTERNOON, & ONE TABLET AT BEDTIME.      Not Delegated - Psychiatry:  Anxiolytics/Hypnotics Failed - 12/17/2019  1:27 PM      Failed - This refill cannot be delegated      Failed - Urine Drug Screen completed in last 360 days      Passed - Valid encounter within last 6 months    Recent Outpatient Visits           2 weeks ago Right arm pain   [place], FNP   5 months ago Adult hypothyroidism   [facility], Lupita Raider, FNP   8 months ago Need for Streptococcus pneumoniae vaccination   [facility], Lupita Raider, FNP   11 months ago COPD (chronic obstructive pulmonary disease) with chronic bronchitis [facility])   [place], DO   1 year ago Benign essential HTN   [facility] Mikey College, NP

## 2019-12-21 DIAGNOSIS — S42294A Other nondisplaced fracture of upper end of right humerus, initial encounter for closed fracture: Secondary | ICD-10-CM | POA: Diagnosis not present

## 2019-12-21 DIAGNOSIS — M7521 Bicipital tendinitis, right shoulder: Secondary | ICD-10-CM | POA: Diagnosis not present

## 2020-01-06 DIAGNOSIS — L400 Psoriasis vulgaris: Secondary | ICD-10-CM | POA: Diagnosis not present

## 2020-01-06 DIAGNOSIS — Z79899 Other long term (current) drug therapy: Secondary | ICD-10-CM | POA: Diagnosis not present

## 2020-01-11 DIAGNOSIS — M7581 Other shoulder lesions, right shoulder: Secondary | ICD-10-CM | POA: Diagnosis not present

## 2020-01-11 DIAGNOSIS — M7521 Bicipital tendinitis, right shoulder: Secondary | ICD-10-CM | POA: Diagnosis not present

## 2020-01-14 ENCOUNTER — Other Ambulatory Visit: Payer: Self-pay | Admitting: Family Medicine

## 2020-01-14 ENCOUNTER — Other Ambulatory Visit: Payer: Self-pay

## 2020-01-14 DIAGNOSIS — F410 Panic disorder [episodic paroxysmal anxiety] without agoraphobia: Secondary | ICD-10-CM

## 2020-01-14 DIAGNOSIS — F3341 Major depressive disorder, recurrent, in partial remission: Secondary | ICD-10-CM

## 2020-01-14 DIAGNOSIS — J42 Unspecified chronic bronchitis: Secondary | ICD-10-CM

## 2020-01-14 DIAGNOSIS — I10 Essential (primary) hypertension: Secondary | ICD-10-CM

## 2020-01-14 DIAGNOSIS — J449 Chronic obstructive pulmonary disease, unspecified: Secondary | ICD-10-CM

## 2020-01-14 DIAGNOSIS — E039 Hypothyroidism, unspecified: Secondary | ICD-10-CM

## 2020-01-14 MED ORDER — ALBUTEROL SULFATE HFA 108 (90 BASE) MCG/ACT IN AERS: INHALATION_SPRAY | RESPIRATORY_TRACT | 0 refills | Status: DC

## 2020-01-14 MED ORDER — CARVEDILOL 3.125 MG PO TABS: 3.1250 mg | ORAL_TABLET | Freq: Two times a day (BID) | ORAL | 0 refills | Status: DC

## 2020-01-14 MED ORDER — DILTIAZEM HCL ER COATED BEADS 120 MG PO CP24: 120.0000 mg | ORAL_CAPSULE | Freq: Every day | ORAL | 0 refills | Status: DC

## 2020-01-14 MED ORDER — CITALOPRAM HYDROBROMIDE 40 MG PO TABS: 40.0000 mg | ORAL_TABLET | Freq: Every day | ORAL | 0 refills | Status: DC

## 2020-01-14 MED ORDER — LEVOTHYROXINE SODIUM 150 MCG PO TABS: 150.0000 ug | ORAL_TABLET | Freq: Every day | ORAL | 0 refills | Status: DC

## 2020-01-14 NOTE — Telephone Encounter (Signed)
Reordering refills for Citalopram, Diltiazem, Carvedilol, Albuterol Inhaler, due to previous refills printed, instead of transmitting electronically.

## 2020-01-14 NOTE — Telephone Encounter (Signed)
Medication:  diltiazem (CARDIZEM CD) 120 MG 24 hr capsule [[id number]]  citcarvedilol (COREG) 3.125 MG tablet [[id number]]  alopram (CELEXA) 40 MG tablet [[id number]]  levothyroxine (SYNTHROID) 150 MCG tablet [[id number]]  albuterol (VENTOLIN HFA) 108 (90 Base) MCG/ACT inhaler [[id number]]   Has the patient contacted their pharmacy? Pharmacy call   Preferred Pharmacy (with phone number or street name):  [place], [place] - [address]  [place], [place] [address]  Phone:  [phone] Fax:  [phone]   Agent: Please be advised that RX refills may take up to 3 business days. We ask that you follow-up with your pharmacy

## 2020-02-02 DIAGNOSIS — L400 Psoriasis vulgaris: Secondary | ICD-10-CM | POA: Diagnosis not present

## 2020-02-11 ENCOUNTER — Other Ambulatory Visit: Payer: Self-pay | Admitting: Family Medicine

## 2020-02-11 DIAGNOSIS — J42 Unspecified chronic bronchitis: Secondary | ICD-10-CM

## 2020-02-11 DIAGNOSIS — E039 Hypothyroidism, unspecified: Secondary | ICD-10-CM

## 2020-02-12 ENCOUNTER — Other Ambulatory Visit: Payer: Self-pay | Admitting: Family Medicine

## 2020-02-12 DIAGNOSIS — F410 Panic disorder [episodic paroxysmal anxiety] without agoraphobia: Secondary | ICD-10-CM

## 2020-02-12 NOTE — Telephone Encounter (Signed)
Personally reviewed [place] today, 02/12/20.  According to PMP aware, pt last received a refill on 01/16/2020.  Refill of her controlled substance will be provided today.

## 2020-02-12 NOTE — Telephone Encounter (Signed)
Requested medication (s) are due for refill today: no  Requested medication (s) are on the active medication list: yes  Last refill:  02/12/2019  Future visit scheduled: yes  Notes to clinic:  this refill cannot be delegated    Requested Prescriptions  Pending Prescriptions Disp Refills   clonazePAM (KLONOPIN) 1 MG tablet [Pharmacy Med Name: clonazepam 1 mg tablet] 75 tablet 2    Sig: TAKE ONE TABLET BY MOUTH EVERY MORNING, ONE-HALF TABLET EVERY AFTERNOON, & ONE TABLET AT BEDTIME.      Not Delegated - Psychiatry:  Anxiolytics/Hypnotics Failed - 02/12/2020 10:26 AM      Failed - This refill cannot be delegated      Failed - Urine Drug Screen completed in last 360 days      Passed - Valid encounter within last 6 months    Recent Outpatient Visits           2 months ago Right arm pain   [place], FNP   7 months ago Adult hypothyroidism   [facility], Lupita Raider, FNP   10 months ago Need for Streptococcus pneumoniae vaccination   [facility], Lupita Raider, FNP   1 year ago COPD (chronic obstructive pulmonary disease) with chronic bronchitis [facility])   [place], DO   1 year ago Benign essential HTN   [facility] [employer], NP

## 2020-03-10 ENCOUNTER — Other Ambulatory Visit: Payer: Self-pay

## 2020-03-10 DIAGNOSIS — I10 Essential (primary) hypertension: Secondary | ICD-10-CM

## 2020-03-10 DIAGNOSIS — F410 Panic disorder [episodic paroxysmal anxiety] without agoraphobia: Secondary | ICD-10-CM

## 2020-03-10 MED ORDER — LISINOPRIL 10 MG PO TABS: 10.0000 mg | ORAL_TABLET | Freq: Every day | ORAL | 1 refills | Status: DC

## 2020-03-10 MED ORDER — BUSPIRONE HCL 7.5 MG PO TABS: 7.5000 mg | ORAL_TABLET | Freq: Three times a day (TID) | ORAL | 0 refills | Status: DC

## 2020-03-31 DIAGNOSIS — L570 Actinic keratosis: Secondary | ICD-10-CM | POA: Diagnosis not present

## 2020-03-31 DIAGNOSIS — L814 Other melanin hyperpigmentation: Secondary | ICD-10-CM | POA: Diagnosis not present

## 2020-03-31 DIAGNOSIS — L821 Other seborrheic keratosis: Secondary | ICD-10-CM | POA: Diagnosis not present

## 2020-04-06 ENCOUNTER — Telehealth: Payer: Self-pay | Admitting: Family Medicine

## 2020-04-06 NOTE — Telephone Encounter (Signed)
No answer unable to notify patient [facility] scheduled on April 12, 2020 will be by phone not in the office-srs

## 2020-04-07 ENCOUNTER — Other Ambulatory Visit: Payer: Self-pay | Admitting: Family Medicine

## 2020-04-07 ENCOUNTER — Other Ambulatory Visit: Payer: Self-pay

## 2020-04-07 DIAGNOSIS — F410 Panic disorder [episodic paroxysmal anxiety] without agoraphobia: Secondary | ICD-10-CM

## 2020-04-07 DIAGNOSIS — J449 Chronic obstructive pulmonary disease, unspecified: Secondary | ICD-10-CM

## 2020-04-07 DIAGNOSIS — I10 Essential (primary) hypertension: Secondary | ICD-10-CM

## 2020-04-07 DIAGNOSIS — F3341 Major depressive disorder, recurrent, in partial remission: Secondary | ICD-10-CM

## 2020-04-07 MED ORDER — CITALOPRAM HYDROBROMIDE 40 MG PO TABS: 40.0000 mg | ORAL_TABLET | Freq: Every day | ORAL | 0 refills | Status: DC

## 2020-04-07 MED ORDER — BREO ELLIPTA 200-25 MCG/INH IN AEPB
INHALATION_SPRAY | RESPIRATORY_TRACT | 0 refills | Status: DC
Start: 2020-04-07 — End: 2020-06-30

## 2020-04-07 MED ORDER — DILTIAZEM HCL ER COATED BEADS 120 MG PO CP24
120.0000 mg | ORAL_CAPSULE | Freq: Every day | ORAL | 0 refills | Status: DC
Start: 2020-04-07 — End: 2020-06-03

## 2020-04-07 MED ORDER — CARVEDILOL 3.125 MG PO TABS: 3.1250 mg | ORAL_TABLET | Freq: Two times a day (BID) | ORAL | 0 refills | Status: DC

## 2020-04-07 NOTE — Telephone Encounter (Signed)
Appointment 04/11/20- refilled per protocol

## 2020-04-11 ENCOUNTER — Encounter: Payer: Self-pay | Admitting: Family Medicine

## 2020-04-11 ENCOUNTER — Ambulatory Visit (INDEPENDENT_AMBULATORY_CARE_PROVIDER_SITE_OTHER): Payer: PPO | Admitting: Family Medicine

## 2020-04-11 ENCOUNTER — Other Ambulatory Visit: Payer: Self-pay

## 2020-04-11 VITALS — BP 128/46 | HR 78 | Temp 97.3°F | Ht 63.0 in | Wt 205.0 lb

## 2020-04-11 DIAGNOSIS — J449 Chronic obstructive pulmonary disease, unspecified: Secondary | ICD-10-CM

## 2020-04-11 DIAGNOSIS — G8929 Other chronic pain: Secondary | ICD-10-CM

## 2020-04-11 DIAGNOSIS — M25551 Pain in right hip: Secondary | ICD-10-CM

## 2020-04-11 NOTE — Patient Instructions (Addendum)
Thank you for coming to the office today.  All medicines have already been refilled.  Use topical Voltaren gel on the Right hip as needed for pain. Up to 4 times.  Recommend to start taking Tylenol Extra Strength 500mg  tabs - take 1 to 2 tabs per dose (max 1000mg ) every 6-8 hours for pain (take regularly, don't skip a dose for next 7 days), max 24 hour daily dose is 6 tablets or 3000mg . In the future you can repeat the same everyday Tylenol course for 1-2 weeks at a time.   May try a heating pad as needed.  Please schedule a Follow-up Appointment to: Return in about 3 months (around 07/12/2020) for 3 month follow-up COPD, Hip Pain, Anxiety, med refills - w/ new provider.  If you have any other questions or concerns, please feel free to call the office or send a message through [facility]. You may also schedule an earlier appointment if necessary.  Additionally, you may be receiving a survey about your experience at our office within a few days to 1 week by e-mail or mail. We value your feedback.  Nobie Putnam, DO [place]

## 2020-04-11 NOTE — Progress Notes (Signed)
Subjective:    Patient ID: [name], female    DOB: 19-Oct-1950, 70 y.o.   MRN: [id number]  [name] is a 70 y.o. female presenting on 04/11/2020 for Hip Pain (Noticed the pain about 3 months ago. It hurts more when pt is active but even while sitting down it still hurts but not as much.//Pt does not know exactly which medication she needs refills on.) and COPD  Previous PCP Cyndia Skeeters, FNP   HPI   COPD Chronic problem She is on Breo daily and Albuterol PRN rescue. Recently had refills on these  PMH Hypothyroidism, Anxiety  Right Hip Pain, Chronic No new injury or fall. Onset R hip pain past 2-3 months, gradual worsening pain. She uses walker and cane at times. Takes Tylenol 500mg  1-2 times a day PRN some relief.   Depression screen [facility] 2/9 12/03/2019 07/10/2019 04/08/2019  Decreased Interest 1 1 1   Down, Depressed, Hopeless 2 3 1   PHQ - 2 Score 3 4 2   Altered sleeping 2 3 2   Tired, decreased energy 1 1 1   Change in appetite 1 1 3   Feeling bad or failure about yourself  0 0 0  Trouble concentrating 0 0 0  Moving slowly or fidgety/restless 0 0 0  Suicidal thoughts 0 0 0  PHQ-9 Score 7 9 8   Difficult doing work/chores Not difficult at all Not difficult at all Not difficult at all  Some recent data might be hidden    Social History   Tobacco Use   Smoking status: Current Every Day Smoker    Packs/day: 1.00    Years: 35.00    Pack years: 35.00    Types: Cigarettes    Start date: 04/19/2018   Smokeless tobacco: Never Used   Tobacco comment: Previously smoked 2 ppd.   Vaping Use   Vaping Use: Never used  Substance Use Topics   Alcohol use: No   Drug use: No    Review of Systems Per HPI unless specifically indicated above     Objective:    BP (!) 128/46 (BP Location: Left Arm, Patient Position: Sitting, Cuff Size: Normal)    Pulse 78    Temp (!) 97.3 F (36.3 C) (Temporal)    Ht 5\' 3"  (1.6 m)    Wt 205 lb (93 kg)    SpO2 95%    BMI 36.31 kg/m   Wt  Readings from Last 3 Encounters:  04/11/20 205 lb (93 kg)  12/03/19 204 lb (92.5 kg)  07/10/19 202 lb 12.8 oz (92 kg)    Physical Exam Vitals and nursing note reviewed.  Constitutional:      General: She is not in acute distress.    Appearance: She is well-developed and well-nourished. She is not diaphoretic.     Comments: Well-appearing, comfortable, cooperative  HENT:     Head: Normocephalic and atraumatic.     Mouth/Throat:     Mouth: Oropharynx is clear and moist.  Eyes:     General:        Right eye: No discharge.        Left eye: No discharge.     Conjunctiva/sclera: Conjunctivae normal.  Cardiovascular:     Rate and Rhythm: Normal rate.  Pulmonary:     Effort: Pulmonary effort is normal.  Musculoskeletal:        General: No edema.  Skin:    General: Skin is warm and dry.     Findings: No erythema or rash.  Neurological:     Mental Status: She is alert and oriented to person, place, and time.  Psychiatric:        Mood and Affect: Mood and affect normal.        Behavior: Behavior normal.     Comments: Well groomed, good eye contact, normal speech and thoughts       Results for orders placed or performed in visit on 12/03/19  CBC with Differential  Result Value Ref Range   WBC 7.8 3.8 - 10.8 Thousand/uL   RBC 5.51 (H) 3.80 - 5.10 Million/uL   Hemoglobin 15.7 (H) 11.7 - 15.5 g/dL   HCT 50.0 (H) 35.0 - 45.0 %   MCV 90.7 80.0 - 100.0 fL   MCH 28.5 27.0 - 33.0 pg   MCHC 31.4 (L) 32.0 - 36.0 g/dL   RDW 12.6 11.0 - 15.0 %   Platelets 228 140 - 400 Thousand/uL   MPV 10.4 7.5 - 12.5 fL   Neutro Abs 4,189 1,500 - 7,800 cells/uL   Lymphs Abs 2,480 850 - 3,900 cells/uL   Absolute Monocytes 741 200 - 950 cells/uL   Eosinophils Absolute 289 15 - 500 cells/uL   Basophils Absolute 101 0 - 200 cells/uL   Neutrophils Relative % 53.7 %   Total Lymphocyte 31.8 %   Monocytes Relative 9.5 %   Eosinophils Relative 3.7 %   Basophils Relative 1.3 %  COMPLETE METABOLIC PANEL  WITH GFR  Result Value Ref Range   Glucose, Bld 104 (H) 65 - 99 mg/dL   BUN 11 7 - 25 mg/dL   Creat 0.82 0.60 - 0.93 mg/dL   GFR, Est Non African American 72 > OR = 60 mL/min/1.1m2   GFR, Est African American 84 > OR = 60 mL/min/1.8m2   BUN/Creatinine Ratio NOT APPLICABLE 6 - 22 (calc)   Sodium 143 135 - 146 mmol/L   Potassium 3.9 3.5 - 5.3 mmol/L   Chloride 98 98 - 110 mmol/L   CO2 37 (H) 20 - 32 mmol/L   Calcium 9.4 8.6 - 10.4 mg/dL   Total Protein 6.5 6.1 - 8.1 g/dL   Albumin 4.0 3.6 - 5.1 g/dL   Globulin 2.5 1.9 - 3.7 g/dL (calc)   AG Ratio 1.6 1.0 - 2.5 (calc)   Total Bilirubin 0.6 0.2 - 1.2 mg/dL   Alkaline phosphatase (APISO) 86 37 - 153 U/L   AST 17 10 - 35 U/L   ALT 13 6 - 29 U/L  Lipid Profile  Result Value Ref Range   Cholesterol 112 <200 mg/dL   HDL 43 (L) > OR = 50 mg/dL   Triglycerides 130 <150 mg/dL   LDL Cholesterol (Calc) 48 mg/dL (calc)   Total CHOL/HDL Ratio 2.6 <5.0 (calc)   Non-HDL Cholesterol (Calc) 69 <130 mg/dL (calc)  TSH + free T4  Result Value Ref Range   TSH W/REFLEX TO FT4 7.49 (H) 0.40 - 4.50 mIU/L  T4, free  Result Value Ref Range   Free T4 1.6 0.8 - 1.8 ng/dL      Assessment & Plan:   Problem List Items Addressed This Visit    COPD (chronic obstructive pulmonary disease) with chronic bronchitis (HCC) - Primary (Chronic)    Other Visit Diagnoses    Chronic right hip pain          Clinically with COPD / Chronic Bronchitis She is currently doing well now without flare up Pulse ox 95% on room air She is continuing to use [employer]  as maintenance and Albuterol PRN Recently already refilled.  #Chronic R Hip Pain New problem but present for months, seems more muscle soreness and stiffness associated with position changes, can be deconditioning and muscle weakness. She uses mobility devices / walker and cane as needed No fall or trauma Will recommend increasing regular dosing Tylenol from twice a day up to 3 times a day up to max 1000mg  per  dose. Also sample Voltaren gel given and coupon today to use PRN. Caution with adding other agent such as muscle relaxant risk of polypharmacy, would avoid this for now.   No orders of the defined types were placed in this encounter.     Follow up plan: Return in about 3 months (around 07/12/2020) for 3 month follow-up COPD, Hip Pain, Anxiety, med refills - w/ new provider.   Nobie Putnam, [facility] 04/11/2020, 1:56 PM

## 2020-04-12 ENCOUNTER — Ambulatory Visit (INDEPENDENT_AMBULATORY_CARE_PROVIDER_SITE_OTHER): Payer: PPO

## 2020-04-12 VITALS — Ht 63.0 in | Wt 205.0 lb

## 2020-04-12 DIAGNOSIS — Z1231 Encounter for screening mammogram for malignant neoplasm of breast: Secondary | ICD-10-CM | POA: Diagnosis not present

## 2020-04-12 DIAGNOSIS — Z0000 Encounter for general adult medical examination without abnormal findings: Secondary | ICD-10-CM | POA: Diagnosis not present

## 2020-04-12 NOTE — Progress Notes (Signed)
I connected with Tyyne Cliett today by telephone and verified that I am speaking with the correct person using two identifiers. Location patient: home Location provider: work Persons participating in the virtual visit: Tejasvi Brissett, Glenna Durand LPN.   I discussed the limitations, risks, security and privacy concerns of performing an evaluation and management service by telephone and the availability of in person appointments. I also discussed with the patient that there may be a patient responsible charge related to this service. The patient expressed understanding and verbally consented to this telephonic visit.    Interactive audio and video telecommunications were attempted between this provider and patient, however failed, due to patient having technical difficulties OR patient did not have access to video capability.  We continued and completed visit with audio only.     Vital signs may be patient reported or missing.  Subjective:   LAMYIAH CRAWSHAW is a 70 y.o. female who presents for Medicare Annual (Subsequent) preventive examination.  Review of Systems     Cardiac Risk Factors include: advanced age (>2men, >7 women);hypertension;obesity (BMI >30kg/m2);sedentary lifestyle;smoking/ tobacco exposure     Objective:    Today's Vitals   04/12/20 0856 04/12/20 0857  Weight: 205 lb (93 kg)   Height: 5\' 3"  (1.6 m)   PainSc:  4    Body mass index is 36.31 kg/m.  Advanced Directives 04/12/2020 04/07/2019 12/30/2017 12/02/2017 12/01/2017 11/30/2017 10/28/2017  Does Patient Have a Medical Advance Directive? Yes Yes Yes (No Data) No (No Data) No  Type of Industrial/product designer of [employer] Power of Attorney - - - -  Does patient want to make changes to medical advance directive? - - - - - - -  Copy of [place] in Chart? No - copy requested No - copy requested - - - - -  Would patient like information on creating a medical  advance directive? - - - - Yes (Inpatient - patient requests chaplain consult to create a medical advance directive) - No - Patient declined    Current Medications (verified) Outpatient Encounter Medications as of 04/12/2020  Medication Sig  . acetaminophen (TYLENOL) 500 MG tablet Take 500 mg by mouth every 6 (six) hours as needed for moderate pain.   Marland Kitchen albuterol (VENTOLIN HFA) 108 (90 Base) MCG/ACT inhaler INHALE 2 PUFFS BY MOUTH INTO THE LUNGS EVERY 6 HOURS AS NEEDED FOR WHEEZING OR SHORTNESS OF BREATH  . albuterol (VENTOLIN HFA) 108 (90 Base) MCG/ACT inhaler INHALE 2 PUFFS BY MOUTH INTO THE LUNGS EVERY 6 HOURS AS NEEDED FOR WHEEZING OR SHORTNESS  . busPIRone (BUSPAR) 7.5 MG tablet TAKE ONE TABLET BY MOUTH THREE TIMES DAILY  . carvedilol (COREG) 3.125 MG tablet Take 1 tablet (3.125 mg total) by mouth 2 (two) times daily with a meal.  . citalopram (CELEXA) 40 MG tablet Take 1 tablet (40 mg total) by mouth daily.  . clonazePAM (KLONOPIN) 1 MG tablet TAKE ONE TABLET BY MOUTH EVERY MORNING, ONE-HALF TABLET EVERY AFTERNOON, & ONE TABLET AT BEDTIME.  . clotrimazole-betamethasone (LOTRISONE) cream Apply 1 application topically 2 (two) times daily.  Marland Kitchen diltiazem (CARDIZEM CD) 120 MG 24 hr capsule Take 1 capsule (120 mg total) by mouth daily.  . fluticasone furoate-vilanterol (BREO ELLIPTA) 200-25 MCG/INH AEPB INHALE 1 PUFF BY MOUTH INTO THE LUNGS ONCE A DAY  . Guselkumab (TREMFYA [place]) Inject into the skin every 30 (thirty) days.  . hydrOXYzine (ATARAX/VISTARIL) 25 MG tablet TAKE ONE TABLET BY MOUTH  AT BEDTIME (Patient not taking: Reported on 04/11/2020)  . levothyroxine (SYNTHROID) 150 MCG tablet TAKE ONE TABLET BY MOUTH ONCE DAILY  . lisinopril (ZESTRIL) 10 MG tablet Take 1 tablet (10 mg total) by mouth daily.  . Misc. Devices (QUAD CANE/SMALL BASE) MISC 1 Device by Does not apply route daily.  Marland Kitchen triamcinolone (KENALOG) 0.025 % ointment Apply 1 application topically as directed.  (Patient not taking: No  sig reported)  . triamcinolone cream (KENALOG) 0.1 % Apply 1 application topically daily. (Patient not taking: No sig reported)   No facility-administered encounter medications on file as of 04/12/2020.    Allergies (verified) Ciprofloxacin, Oxycodone, Oxycodone-acetaminophen, Oxycodone-acetaminophen, and Sulfa antibiotics   History: Past Medical History:  Diagnosis Date  . Anxiety   . Arthritis   . Chronic back pain   . COPD (chronic obstructive pulmonary disease) ([place])   . Depression   . GERD (gastroesophageal reflux disease)   . Hypertension   . Hypothyroidism   . Pneumonia    RECENT X 3  . PONV (postoperative nausea and vomiting)   . Psoriasis   . Thyroid disease    Past Surgical History:  Procedure Laterality Date  . ABDOMINAL HYSTERECTOMY     heavy bleeding  . bladder stimulator    . CARPAL TUNNEL RELEASE    . CATARACT EXTRACTION W/PHACO Right 03/07/2015   Procedure: CATARACT EXTRACTION PHACO AND INTRAOCULAR LENS PLACEMENT (IOC);  Surgeon: Estill Cotta, MD;  Location: [facility] ORS;  Service: Ophthalmology;  Laterality: Right;  [name] 01:14 AP% 26.4 CDE 33.04 fluid pack lot # [id number]  . CATARACT EXTRACTION W/PHACO Left 03/21/2015   Procedure: CATARACT EXTRACTION PHACO AND INTRAOCULAR LENS PLACEMENT (IOC);  Surgeon: Estill Cotta, MD;  Location: [facility] ORS;  Service: Ophthalmology;  Laterality: Left;  [name] 01:21 AP% 25.6 CDE 39.78 fluid pack lot # [id number]  . HAND SURGERY Right   . INTERSTIM IMPLANT REMOVAL N/A 09/07/2015   Procedure: REMOVAL OF INTERSTIM IMPLANT;  Surgeon: Bjorn Loser, MD;  Location: [facility] ORS;  Service: Urology;  Laterality: N/A;  . OOPHORECTOMY Right    ovarian cyst   Family History  Problem Relation Age of Onset  . Heart disease Mother   . Breast cancer Mother   . Heart attack Mother   . COPD Father   . Stroke Paternal Uncle   . Kidney disease Sister   . Breast cancer Cousin   . Breast cancer Cousin   . Heart attack Maternal Aunt   .  Stroke Paternal Grandfather   . Kidney cancer Neg Hx   . Bladder Cancer Neg Hx    Social History   Socioeconomic History  . Marital status: Widowed    Spouse name: Not on file  . Number of children: Not on file  . Years of education: Not on file  . Highest education level: Not on file  Occupational History  . Occupation: retired  Tobacco Use  . Smoking status: Current Every Day Smoker    Packs/day: 1.00    Years: 35.00    Pack years: 35.00    Types: Cigarettes    Start date: 04/19/2018  . Smokeless tobacco: Never Used  . Tobacco comment: Previously smoked 2 ppd.   Vaping Use  . Vaping Use: Never used  Substance and Sexual Activity  . Alcohol use: No  . Drug use: No  . Sexual activity: Not Currently    Birth control/protection: Surgical  Other Topics Concern  . Not on file  Social History Narrative  .  Not on file   Social Determinants of Health   Financial Resource Strain: Low Risk   . Difficulty of Paying Living Expenses: Not hard at all  Food Insecurity: No Food Insecurity  . Worried About Charity fundraiser in the Last Year: Never true  . Ran Out of Food in the Last Year: Never true  Transportation Needs: No Transportation Needs  . Lack of Transportation (Medical): No  . Lack of Transportation (Non-Medical): No  Physical Activity: Inactive  . Days of Exercise per Week: 0 days  . Minutes of Exercise per Session: 0 min  Stress: No Stress Concern Present  . Feeling of Stress : Not at all  Social Connections: Not on file    Tobacco Counseling Ready to quit: Yes Counseling given: Not Answered Comment: Previously smoked 2 ppd.    Clinical Intake:  Pre-visit preparation completed: Yes  Pain : 0-10 Pain Score: 4  Pain Type: Acute pain Pain Location: Hip Pain Orientation: Right,Left Pain Descriptors / Indicators: Aching Pain Onset: More than a month ago Pain Frequency: Constant Pain Relieving Factors: rest in recliner  Pain Relieving Factors: rest in  recliner  Nutritional Status: BMI > 30  Obese Nutritional Risks: None Diabetes: No  How often do you need to have someone help you when you read instructions, pamphlets, or other written materials from your doctor or pharmacy?: 1 - Never What is the last grade level you completed in school?: 11th grade  Diabetic?no  Interpreter Needed?: No  Information entered by :: NAllen LPN   Activities of Daily Living In your present state of health, do you have any difficulty performing the following activities: 04/12/2020  Hearing? N  Vision? N  Difficulty concentrating or making decisions? N  Walking or climbing stairs? Y  Comment due to hips  Dressing or bathing? N  Doing errands, shopping? Y  Comment does not Physiological scientist and eating ? N  Using the Toilet? N  In the past six months, have you accidently leaked urine? Y  Comment wears pads  Do you have problems with loss of bowel control? N  Managing your Medications? N  Managing your Finances? N  Housekeeping or managing your Housekeeping? N  Some recent data might be hidden    Patient Care Team: Malfi, Lupita Raider, FNP as PCP - General (Family Medicine) Dingeldein, Remo Lipps, MD as Consulting Physician (Ophthalmology) Jannet Mantis, MD as Consulting Physician (Dermatology) Corey Skains, MD as Consulting Physician (Cardiology) Minor, Dalbert Garnet, RN (Inactive) as Case Manager  Indicate any recent Medical Services you may have received from other than [facility] providers in the past year (date may be approximate).     Assessment:   This is a routine wellness examination for Allyanna.  Hearing/Vision screen  Hearing Screening   125Hz  250Hz  500Hz  1000Hz  2000Hz  3000Hz  4000Hz  6000Hz  8000Hz   Right ear:           Left ear:           Vision Screening Comments: No regular eye exams, [facility]  Dietary issues and exercise activities discussed: Current Exercise Habits: The patient does not participate in regular  exercise at present  Goals    . Increase water intake     Recommend increasing water intake to 4 glasses of water a day.    . Patient Stated     04/12/2020, no goals    . Quit Smoking     Smoking cessation discussed  Depression Screen PHQ 2/9 Scores 04/12/2020 12/03/2019 07/10/2019 04/08/2019 04/07/2019 01/09/2019 10/10/2018  PHQ - 2 Score 1 3 4 2 4 6 2   PHQ- 9 Score 2 7 9 8 5 12 3   Exception Documentation - - - - - - -    Fall Risk Fall Risk  04/12/2020 04/07/2019 12/31/2018 12/05/2017 06/26/2017  Falls in the past year? 1 1 0 Yes Yes  Comment fell trying to get in bed, doesn't know what happened - Emmi Telephone Survey: data to providers prior to load - -  Number falls in past yr: 1 0 - 2 or more 2 or more  Comment - fell couple weeks ago - - -  Injury with Fall? 0 1 - No No  Comment - left shoulder pain - - -  Risk Factor Category  - - - High Fall Risk -  Risk for fall due to : Impaired balance/gait;Impaired mobility;Medication side effect - - History of fall(s) History of fall(s)  Risk for fall due to: Comment - - - - -  Follow up Falls evaluation completed;Education provided;Falls prevention discussed Falls prevention discussed - Falls prevention discussed -    FALL RISK PREVENTION PERTAINING TO THE HOME:  Any stairs in or around the home? No  If so, are there any without handrails? n/a Home free of loose throw rugs in walkways, pet beds, electrical cords, etc? Yes  Adequate lighting in your home to reduce risk of falls? Yes   ASSISTIVE DEVICES UTILIZED TO PREVENT FALLS:  Life alert? No  Use of a cane, walker or w/c? Yes  Grab bars in the bathroom? Yes  Shower chair or bench in shower? No  Elevated toilet seat or a handicapped toilet? Yes   TIMED UP AND GO:  Was the test performed? No   Cognitive Function:     6CIT Screen 04/12/2020 05/14/2017 05/07/2016  What Year? 0 points 0 points 0 points  What month? 0 points 0 points 0 points  What time? 0 points 0 points 0 points   Count back from 20 0 points 0 points 0 points  Months in reverse 0 points 0 points 0 points  Repeat phrase 2 points 0 points 4 points  Total Score 2 0 4    Immunizations Immunization History  Administered Date(s) Administered  . Fluad Quad(high Dose 65+) 12/03/2019  . Influenza, High Dose Seasonal PF 01/07/2015, 11/14/2015, 11/06/2016, 11/20/2017  . Influenza, Seasonal, Injecte, Preservative Fre 10/27/2008, 12/13/2010, 11/05/2012  . Influenza,inj,Quad PF,6+ Mos 10/26/2013  . Influenza-Unspecified 10/06/2013  . Pneumococcal Conjugate-13 05/14/2017  . Pneumococcal Polysaccharide-23 11/25/2013, 04/08/2019    TDAP status: Up to date  Flu Vaccine status: Up to date  Pneumococcal vaccine status: Up to date  Covid-19 vaccine status: Declined, Education has been provided regarding the importance of this vaccine but patient still declined. Advised may receive this vaccine at local pharmacy or Health Dept.or vaccine clinic. Aware to provide a copy of the vaccination record if obtained from local pharmacy or Health Dept. Verbalized acceptance and understanding.  Qualifies for Shingles Vaccine? Yes   Zostavax completed No   Shingrix Completed?: No.    Education has been provided regarding the importance of this vaccine. Patient has been advised to call insurance company to determine out of pocket expense if they have not yet received this vaccine. Advised may also receive vaccine at local pharmacy or Health Dept. Verbalized acceptance and understanding.  Screening Tests Health Maintenance  Topic Date Due  . COVID-19 Vaccine (1) Never  done  . COLONOSCOPY (Pts 45-27yrs Insurance coverage will need to be confirmed)  Never done  . MAMMOGRAM  08/31/2018  . TETANUS/TDAP  02/05/2021  . INFLUENZA VACCINE  Completed  . DEXA SCAN  Completed  . Hepatitis C Screening  Completed  . PNA vac Low Risk Adult  Completed  . HPV VACCINES  Aged Out    Health Maintenance  Health Maintenance Due  Topic  Date Due  . COVID-19 Vaccine (1) Never done  . COLONOSCOPY (Pts 45-36yrs Insurance coverage will need to be confirmed)  Never done  . MAMMOGRAM  08/31/2018    Colorectal cancer screening: due  Mammogram status: due  Bone Density status: Completed 10/01/2013.   Lung Cancer Screening: (Low Dose CT Chest recommended if Age 62-80 years, 30 pack-year currently smoking OR have quit w/in 15years.) does qualify.   Lung Cancer Screening Referral: no  Additional Screening:  Hepatitis C Screening: does qualify; Completed 12/31/2018  Vision Screening: Recommended annual ophthalmology exams for early detection of glaucoma and other disorders of the eye. Is the patient up to date with their annual eye exam?  No  Who is the provider or what is the name of the office in which the patient attends annual eye exams? [facility] If pt is not established with a provider, would they like to be referred to a provider to establish care? No .   Dental Screening: Recommended annual dental exams for proper oral hygiene  Community Resource Referral / Chronic Care Management: CRR required this visit?  No   CCM required this visit?  No      Plan:     I have personally reviewed and noted the following in the patient's chart:   . Medical and social history . Use of alcohol, tobacco or illicit drugs  . Current medications and supplements . Functional ability and status . Nutritional status . Physical activity . Advanced directives . List of other physicians . Hospitalizations, surgeries, and ER visits in previous 12 months . Vitals . Screenings to include cognitive, depression, and falls . Referrals and appointments  In addition, I have reviewed and discussed with patient certain preventive protocols, quality metrics, and best practice recommendations. A written personalized care plan for preventive services as well as general preventive health recommendations were provided to patient.      Kellie Simmering, LPN   07/11/4401   Nurse Notes:

## 2020-04-12 NOTE — Patient Instructions (Signed)
[name] , Thank you for taking time to come for your Medicare Wellness Visit. I appreciate your ongoing commitment to your health goals. Please review the following plan we discussed and let me know if I can assist you in the future.   Screening recommendations/referrals: Colonoscopy: due Mammogram: ordered today Bone Density: completed 10/01/2013 Recommended yearly ophthalmology/optometry visit for glaucoma screening and checkup Recommended yearly dental visit for hygiene and checkup  Vaccinations: Influenza vaccine: completed 12/03/2019, due 09/05/2020 Pneumococcal vaccine: completed 04/08/2019 Tdap vaccine: completed 02/06/2011, due 02/05/2021 Shingles vaccine: discussed   Covid-19: decline  Advanced directives: Please bring a copy of your POA (Power of Attorney) and/or Living Will to your next appointment.   Conditions/risks identified: smoking  Next appointment: Follow up in one year for your annual wellness visit    Preventive Care 7 Years and Older, Female Preventive care refers to lifestyle choices and visits with your health care provider that can promote health and wellness. What does preventive care include?  A yearly physical exam. This is also called an annual well check.  Dental exams once or twice a year.  Routine eye exams. Ask your health care provider how often you should have your eyes checked.  Personal lifestyle choices, including:  Daily care of your teeth and gums.  Regular physical activity.  Eating a healthy diet.  Avoiding tobacco and drug use.  Limiting alcohol use.  Practicing safe sex.  Taking low-dose aspirin every day.  Taking vitamin and mineral supplements as recommended by your health care provider. What happens during an annual well check? The services and screenings done by your health care provider during your annual well check will depend on your age, overall health, lifestyle risk factors, and family history of disease. Counseling   Your health care provider may ask you questions about your:  Alcohol use.  Tobacco use.  Drug use.  Emotional well-being.  Home and relationship well-being.  Sexual activity.  Eating habits.  History of falls.  Memory and ability to understand (cognition).  Work and work Statistician.  Reproductive health. Screening  You may have the following tests or measurements:  Height, weight, and BMI.  Blood pressure.  Lipid and cholesterol levels. These may be checked every 5 years, or more frequently if you are over 63 years old.  Skin check.  Lung cancer screening. You may have this screening every year starting at age 48 if you have a 30-pack-year history of smoking and currently smoke or have quit within the past 15 years.  Fecal occult blood test (FOBT) of the stool. You may have this test every year starting at age 24.  Flexible sigmoidoscopy or colonoscopy. You may have a sigmoidoscopy every 5 years or a colonoscopy every 10 years starting at age 27.  Hepatitis C blood test.  Hepatitis B blood test.  Sexually transmitted disease (STD) testing.  Diabetes screening. This is done by checking your blood sugar (glucose) after you have not eaten for a while (fasting). You may have this done every 1-3 years.  Bone density scan. This is done to screen for osteoporosis. You may have this done starting at age 37.  Mammogram. This may be done every 1-2 years. Talk to your health care provider about how often you should have regular mammograms. Talk with your health care provider about your test results, treatment options, and if necessary, the need for more tests. Vaccines  Your health care provider may recommend certain vaccines, such as:  Influenza vaccine. This is  recommended every year.  Tetanus, diphtheria, and acellular pertussis (Tdap, Td) vaccine. You may need a Td booster every 10 years.  Zoster vaccine. You may need this after age 55.  Pneumococcal 13-valent  conjugate (PCV13) vaccine. One dose is recommended after age 58.  Pneumococcal polysaccharide (PPSV23) vaccine. One dose is recommended after age 59. Talk to your health care provider about which screenings and vaccines you need and how often you need them. This information is not intended to replace advice given to you by your health care provider. Make sure you discuss any questions you have with your health care provider. Document Released: 02/18/2015 Document Revised: 10/12/2015 Document Reviewed: 11/23/2014 Elsevier Interactive Patient Education  2017 [place] Prevention in the Home Falls can cause injuries. They can happen to people of all ages. There are many things you can do to make your home safe and to help prevent falls. What can I do on the outside of my home?  Regularly fix the edges of walkways and driveways and fix any cracks.  Remove anything that might make you trip as you walk through a door, such as a raised step or threshold.  Trim any bushes or trees on the path to your home.  Use bright outdoor lighting.  Clear any walking paths of anything that might make someone trip, such as rocks or tools.  Regularly check to see if handrails are loose or broken. Make sure that both sides of any steps have handrails.  Any raised decks and porches should have guardrails on the edges.  Have any leaves, snow, or ice cleared regularly.  Use sand or salt on walking paths during winter.  Clean up any spills in your garage right away. This includes oil or grease spills. What can I do in the bathroom?  Use night lights.  Install grab bars by the toilet and in the tub and shower. Do not use towel bars as grab bars.  Use non-skid mats or decals in the tub or shower.  If you need to sit down in the shower, use a plastic, non-slip stool.  Keep the floor dry. Clean up any water that spills on the floor as soon as it happens.  Remove soap buildup in the tub or  shower regularly.  Attach bath mats securely with double-sided non-slip rug tape.  Do not have throw rugs and other things on the floor that can make you trip. What can I do in the bedroom?  Use night lights.  Make sure that you have a light by your bed that is easy to reach.  Do not use any sheets or blankets that are too big for your bed. They should not hang down onto the floor.  Have a firm chair that has side arms. You can use this for support while you get dressed.  Do not have throw rugs and other things on the floor that can make you trip. What can I do in the kitchen?  Clean up any spills right away.  Avoid walking on wet floors.  Keep items that you use a lot in easy-to-reach places.  If you need to reach something above you, use a strong step stool that has a grab bar.  Keep electrical cords out of the way.  Do not use floor polish or wax that makes floors slippery. If you must use wax, use non-skid floor wax.  Do not have throw rugs and other things on the floor that can make you trip.  What can I do with my stairs?  Do not leave any items on the stairs.  Make sure that there are handrails on both sides of the stairs and use them. Fix handrails that are broken or loose. Make sure that handrails are as long as the stairways.  Check any carpeting to make sure that it is firmly attached to the stairs. Fix any carpet that is loose or worn.  Avoid having throw rugs at the top or bottom of the stairs. If you do have throw rugs, attach them to the floor with carpet tape.  Make sure that you have a light switch at the top of the stairs and the bottom of the stairs. If you do not have them, ask someone to add them for you. What else can I do to help prevent falls?  Wear shoes that:  Do not have high heels.  Have rubber bottoms.  Are comfortable and fit you well.  Are closed at the toe. Do not wear sandals.  If you use a stepladder:  Make sure that it is fully  opened. Do not climb a closed stepladder.  Make sure that both sides of the stepladder are locked into place.  Ask someone to hold it for you, if possible.  Clearly mark and make sure that you can see:  Any grab bars or handrails.  First and last steps.  Where the edge of each step is.  Use tools that help you move around (mobility aids) if they are needed. These include:  Canes.  Walkers.  Scooters.  Crutches.  Turn on the lights when you go into a dark area. Replace any light bulbs as soon as they burn out.  Set up your furniture so you have a clear path. Avoid moving your furniture around.  If any of your floors are uneven, fix them.  If there are any pets around you, be aware of where they are.  Review your medicines with your doctor. Some medicines can make you feel dizzy. This can increase your chance of falling. Ask your doctor what other things that you can do to help prevent falls. This information is not intended to replace advice given to you by your health care provider. Make sure you discuss any questions you have with your health care provider. Document Released: 11/18/2008 Document Revised: 06/30/2015 Document Reviewed: 02/26/2014 Elsevier Interactive Patient Education  2017 [employer].

## 2020-05-06 ENCOUNTER — Other Ambulatory Visit: Payer: Self-pay | Admitting: Family Medicine

## 2020-05-06 ENCOUNTER — Other Ambulatory Visit: Payer: Self-pay

## 2020-05-06 DIAGNOSIS — J42 Unspecified chronic bronchitis: Secondary | ICD-10-CM

## 2020-05-06 DIAGNOSIS — I10 Essential (primary) hypertension: Secondary | ICD-10-CM

## 2020-05-06 DIAGNOSIS — F410 Panic disorder [episodic paroxysmal anxiety] without agoraphobia: Secondary | ICD-10-CM

## 2020-05-06 MED ORDER — ALBUTEROL SULFATE HFA 108 (90 BASE) MCG/ACT IN AERS: INHALATION_SPRAY | RESPIRATORY_TRACT | 2 refills | Status: DC

## 2020-05-06 MED ORDER — CLONAZEPAM 1 MG PO TABS: ORAL_TABLET | ORAL | 2 refills | Status: DC

## 2020-05-06 NOTE — Progress Notes (Signed)
Sent!

## 2020-05-06 NOTE — Telephone Encounter (Signed)
Requested medication (s) are due for refill today:   Yes  Requested medication (s) are on the active medication list:   Yes  Future visit scheduled:   Yes with [name] 07/14/2020   Last ordered: 03/10/2020 #30, 1 by Dr. Park Liter with [facility] note:   Returned because wasn't sure who to use as the provider.     Requested Prescriptions  Pending Prescriptions Disp Refills   lisinopril (ZESTRIL) 10 MG tablet [Pharmacy Med Name: lisinopril 10 mg tablet] 30 tablet 1    Sig: Take 1 tablet (10 mg total) by mouth daily.      Cardiovascular:  ACE Inhibitors Failed - 05/06/2020  9:10 AM      Failed - Last BP in normal range    BP Readings from Last 1 Encounters:  04/11/20 (!) 128/46          Passed - Cr in normal range and within 180 days    Creat  Date Value Ref Range Status  12/03/2019 0.82 0.60 - 0.93 mg/dL Final    Comment:    For patients >41 years of age, the reference limit for Creatinine is approximately 13% higher for people identified as African-American. .           Passed - K in normal range and within 180 days    Potassium  Date Value Ref Range Status  12/03/2019 3.9 3.5 - 5.3 mmol/L Final  02/02/2013 3.7 3.5 - 5.1 mmol/L Final          Passed - Patient is not pregnant      Passed - Valid encounter within last 6 months    Recent Outpatient Visits           3 weeks ago COPD (chronic obstructive pulmonary disease) with chronic bronchitis ([place])   [place], DO   5 months ago Right arm pain   [place], FNP   10 months ago Adult hypothyroidism   [place], FNP   1 year ago Need for Streptococcus pneumoniae vaccination   [facility], Lupita Raider, FNP   1 year ago COPD (chronic obstructive pulmonary disease) with chronic bronchitis ([place])   [facility] Olin Hauser, DO        Future Appointments             In 2 months Baity, Coralie Keens, NP [facility], [place]   In 11 months  [facility], [facility]

## 2020-05-07 ENCOUNTER — Other Ambulatory Visit: Payer: Self-pay | Admitting: Family Medicine

## 2020-05-07 DIAGNOSIS — F410 Panic disorder [episodic paroxysmal anxiety] without agoraphobia: Secondary | ICD-10-CM

## 2020-05-07 NOTE — Telephone Encounter (Signed)
Requested Prescriptions  Pending Prescriptions Disp Refills  . busPIRone (BUSPAR) 7.5 MG tablet [Pharmacy Med Name: buspirone 7.5 mg tablet] 90 tablet 1    Sig: TAKE ONE TABLET BY MOUTH THREE TIMES DAILY     Psychiatry: Anxiolytics/Hypnotics - Non-controlled Passed - 05/07/2020  9:13 AM      Passed - Valid encounter within last 6 months    Recent Outpatient Visits          3 weeks ago COPD (chronic obstructive pulmonary disease) with chronic bronchitis ([place])   [place], DO   5 months ago Right arm pain   [place], FNP   10 months ago Adult hypothyroidism   [place], FNP   1 year ago Need for Streptococcus pneumoniae vaccination   [facility], Lupita Raider, FNP   1 year ago COPD (chronic obstructive pulmonary disease) with chronic bronchitis ([place])   [facility] Olin Hauser, DO      Future Appointments            In 2 months Baity, Coralie Keens, NP [facility], [place]   In 11 months  [facility], [facility]

## 2020-06-03 ENCOUNTER — Other Ambulatory Visit: Payer: Self-pay | Admitting: Family Medicine

## 2020-06-03 ENCOUNTER — Telehealth: Payer: Self-pay | Admitting: Family Medicine

## 2020-06-03 ENCOUNTER — Other Ambulatory Visit: Payer: Self-pay

## 2020-06-03 DIAGNOSIS — F3341 Major depressive disorder, recurrent, in partial remission: Secondary | ICD-10-CM

## 2020-06-03 DIAGNOSIS — F410 Panic disorder [episodic paroxysmal anxiety] without agoraphobia: Secondary | ICD-10-CM

## 2020-06-03 DIAGNOSIS — I10 Essential (primary) hypertension: Secondary | ICD-10-CM

## 2020-06-03 MED ORDER — CLONAZEPAM 1 MG PO TABS: ORAL_TABLET | ORAL | 2 refills | Status: DC

## 2020-06-03 NOTE — Telephone Encounter (Signed)
[name] from [place] states they never received a prescription from 05/06/2020 for clonazePAM (KLONOPIN) 1 MG tablet and has sent over 2 request. Patient states she will run out and would like request expedited, please advise

## 2020-06-03 NOTE — Telephone Encounter (Signed)
Sent rx today for Clonazepam.  Nobie Putnam, DO [facility] 06/03/2020, 4:59 PM

## 2020-06-08 ENCOUNTER — Other Ambulatory Visit: Payer: Self-pay

## 2020-06-08 DIAGNOSIS — F410 Panic disorder [episodic paroxysmal anxiety] without agoraphobia: Secondary | ICD-10-CM

## 2020-06-30 ENCOUNTER — Other Ambulatory Visit: Payer: Self-pay | Admitting: Family Medicine

## 2020-06-30 DIAGNOSIS — J449 Chronic obstructive pulmonary disease, unspecified: Secondary | ICD-10-CM

## 2020-07-14 ENCOUNTER — Ambulatory Visit: Payer: PPO | Admitting: Internal Medicine

## 2020-07-25 ENCOUNTER — Ambulatory Visit (INDEPENDENT_AMBULATORY_CARE_PROVIDER_SITE_OTHER): Payer: PPO | Admitting: Internal Medicine

## 2020-07-25 ENCOUNTER — Encounter: Payer: Self-pay | Admitting: Internal Medicine

## 2020-07-25 ENCOUNTER — Other Ambulatory Visit: Payer: Self-pay | Admitting: Family Medicine

## 2020-07-25 ENCOUNTER — Other Ambulatory Visit: Payer: Self-pay

## 2020-07-25 VITALS — BP 133/58 | HR 92 | Temp 97.9°F | Resp 22 | Ht 63.0 in | Wt 210.6 lb

## 2020-07-25 DIAGNOSIS — I70219 Atherosclerosis of native arteries of extremities with intermittent claudication, unspecified extremity: Secondary | ICD-10-CM | POA: Diagnosis not present

## 2020-07-25 DIAGNOSIS — F99 Mental disorder, not otherwise specified: Secondary | ICD-10-CM

## 2020-07-25 DIAGNOSIS — R7303 Prediabetes: Secondary | ICD-10-CM

## 2020-07-25 DIAGNOSIS — M5137 Other intervertebral disc degeneration, lumbosacral region: Secondary | ICD-10-CM

## 2020-07-25 DIAGNOSIS — E039 Hypothyroidism, unspecified: Secondary | ICD-10-CM

## 2020-07-25 DIAGNOSIS — I10 Essential (primary) hypertension: Secondary | ICD-10-CM | POA: Diagnosis not present

## 2020-07-25 DIAGNOSIS — M25551 Pain in right hip: Secondary | ICD-10-CM | POA: Diagnosis not present

## 2020-07-25 DIAGNOSIS — I700 Atherosclerosis of aorta: Secondary | ICD-10-CM | POA: Diagnosis not present

## 2020-07-25 DIAGNOSIS — I6523 Occlusion and stenosis of bilateral carotid arteries: Secondary | ICD-10-CM

## 2020-07-25 DIAGNOSIS — J449 Chronic obstructive pulmonary disease, unspecified: Secondary | ICD-10-CM | POA: Diagnosis not present

## 2020-07-25 DIAGNOSIS — F410 Panic disorder [episodic paroxysmal anxiety] without agoraphobia: Secondary | ICD-10-CM

## 2020-07-25 DIAGNOSIS — F5105 Insomnia due to other mental disorder: Secondary | ICD-10-CM

## 2020-07-25 DIAGNOSIS — R609 Edema, unspecified: Secondary | ICD-10-CM

## 2020-07-25 DIAGNOSIS — Z6837 Body mass index (BMI) 37.0-37.9, adult: Secondary | ICD-10-CM

## 2020-07-25 DIAGNOSIS — G8929 Other chronic pain: Secondary | ICD-10-CM

## 2020-07-25 DIAGNOSIS — L409 Psoriasis, unspecified: Secondary | ICD-10-CM

## 2020-07-25 DIAGNOSIS — E782 Mixed hyperlipidemia: Secondary | ICD-10-CM | POA: Diagnosis not present

## 2020-07-25 DIAGNOSIS — M25552 Pain in left hip: Secondary | ICD-10-CM

## 2020-07-25 DIAGNOSIS — F3341 Major depressive disorder, recurrent, in partial remission: Secondary | ICD-10-CM | POA: Diagnosis not present

## 2020-07-25 MED ORDER — FUROSEMIDE 20 MG PO TABS: 20.0000 mg | ORAL_TABLET | Freq: Every day | ORAL | 0 refills | Status: DC

## 2020-07-25 NOTE — Progress Notes (Signed)
Subjective:    Patient ID: [name], female    DOB: 1950/05/21, 70 y.o.   MRN: [id number]  HPI  Pt presents to the clinic today for follow up of chronic conditions. She is establishing care with me today, transferring care from [facility], NP.  HTN: Her BP today is 133/58. She is taking Carvedilol, Diltiazem and Lisinopril as prescribed. ECG from 11/2017 reviewed.  HLD with Carotid/Thoracic Atherosclerosis, PAD with Claudication: Her last LDL was 48, triglycerides 130, 11/2019. She is not taking any cholesterol lowering medication at this time. She is not following with Vascular.  COPD: She reports chronic cough and shortness of breath. She is taking Breo and Albuterol as prescribed.  There are no PFT's on file. She does still smoke. She does not follow with pulmonology.  OA: Mainly in her low back and bilateral hips. She reports her low back is worse after surgery 2-3 years ago. She takes Tylenol OTC with minimal relief of symptoms. She does not follow with an orthopedist.  Depression, Anxiety, and Panic Attacks: Chronic, managed on Citalopram, Buspirone, Hydroxyzine and Clonazepam. She is not seeing a therapist or psychiatry. She denies SI/HI.  Insomnia: She has difficulty staying asleep. She is taking Clonazepam at bedtime with good relief. There is no sleep study on file.   Prediabetes: Her last A1C was 6%, 2019. She is not taking any oral diabetic medication at this time. She does not check her sugars.  Hypothyroidism: She denies any issues on her current dose of Levothyroxine. She does not follow with endocrinology.  Psoriasis: Maanged on Tremfya. She follows with dermatology for this.  She also reports swelling in her bot feet. She noticed this about 6 weeks ago. She reports the swelling is painful at times. She describes the pain as pins and needles. She has some associated numbness, tingling but denies weakness. She has noticed increased warmth of BLE. The pain is worse with  walking and improves with rest.   Review of Systems     Past Medical History:  Diagnosis Date   Anxiety    Arthritis    Chronic back pain    COPD (chronic obstructive pulmonary disease) (HCC)    Depression    GERD (gastroesophageal reflux disease)    Hypertension    Hypothyroidism    Pneumonia    RECENT X 3   PONV (postoperative nausea and vomiting)    Psoriasis    Thyroid disease     Current Outpatient Medications  Medication Sig Dispense Refill   acetaminophen (TYLENOL) 500 MG tablet Take 500 mg by mouth every 6 (six) hours as needed for moderate pain.      albuterol (VENTOLIN HFA) 108 (90 Base) MCG/ACT inhaler INHALE 2 PUFFS BY MOUTH INTO THE LUNGS EVERY 6 HOURS AS NEEDED FOR WHEEZING OR SHORTNESS OF BREATH 25.5 g 0   albuterol (VENTOLIN HFA) 108 (90 Base) MCG/ACT inhaler Inhale 2 puffs by mouth into the lungs every 6 hours as needed for wheezing or shortness of breath 8.5 each 2   BREO ELLIPTA 200-25 MCG/INH AEPB INHALE 1 PUFF BY MOUTH INTO THE LUNGS ONCE A DAY 60 each 0   busPIRone (BUSPAR) 7.5 MG tablet TAKE ONE TABLET BY MOUTH THREE TIMES DAILY 90 tablet 0   carvedilol (COREG) 3.125 MG tablet TAKE ONE TABLET BY MOUTH TWICE DAILY TAKE WITH A MEAL 180 tablet 0   citalopram (CELEXA) 40 MG tablet TAKE ONE TABLET BY MOUTH ONCE DAILY 90 tablet 0   clonazePAM (  KLONOPIN) 1 MG tablet Take one tablet by mouth every morning, one-half tablet every afternoon and one tablet at bedtime 75 tablet 2   clotrimazole-betamethasone (LOTRISONE) cream Apply 1 application topically 2 (two) times daily. 30 g 0   diltiazem (CARDIZEM CD) 120 MG 24 hr capsule TAKE ONE CAPSULE BY MOUTH ONCE DAILY 90 capsule 0   Guselkumab (TREMFYA [place]) Inject into the skin every 30 (thirty) days.     hydrOXYzine (ATARAX/VISTARIL) 25 MG tablet TAKE ONE TABLET BY MOUTH AT BEDTIME (Patient not taking: Reported on 04/11/2020) 30 tablet 0   levothyroxine (SYNTHROID) 150 MCG tablet TAKE ONE TABLET BY MOUTH ONCE DAILY 90 tablet  2   lisinopril (ZESTRIL) 10 MG tablet Take 1 tablet (10 mg total) by mouth daily. 30 tablet 2   Misc. Devices (QUAD CANE/SMALL BASE) MISC 1 Device by Does not apply route daily. 1 each 0   triamcinolone (KENALOG) 0.025 % ointment Apply 1 application topically as directed.  (Patient not taking: No sig reported)     triamcinolone cream (KENALOG) 0.1 % Apply 1 application topically daily. (Patient not taking: No sig reported)     No current facility-administered medications for this visit.    Allergies  Allergen Reactions   Ciprofloxacin Hives and Diarrhea   Oxycodone Diarrhea   Oxycodone-Acetaminophen Diarrhea   Oxycodone-Acetaminophen Diarrhea   Sulfa Antibiotics Rash    Throat, mouth Throat, mouth    Family History  Problem Relation Age of Onset   Heart disease Mother    Breast cancer Mother    Heart attack Mother    COPD Father    Stroke Paternal Uncle    Kidney disease Sister    Breast cancer Cousin    Breast cancer Cousin    Heart attack Maternal Aunt    Stroke Paternal Grandfather    Kidney cancer Neg Hx    Bladder Cancer Neg Hx     Social History   Socioeconomic History   Marital status: Widowed    Spouse name: Not on file   Number of children: Not on file   Years of education: Not on file   Highest education level: Not on file  Occupational History   Occupation: retired  Tobacco Use   Smoking status: Every Day    Packs/day: 1.00    Years: 35.00    Pack years: 35.00    Types: Cigarettes    Start date: 04/19/2018   Smokeless tobacco: Never   Tobacco comments:    Previously smoked 2 ppd.   Vaping Use   Vaping Use: Never used  Substance and Sexual Activity   Alcohol use: No   Drug use: No   Sexual activity: Not Currently    Birth control/protection: Surgical  Other Topics Concern   Not on file  Social History Narrative   Not on file   Social Determinants of Health   Financial Resource Strain: Low Risk    Difficulty of Paying Living Expenses: Not  hard at all  Food Insecurity: No Food Insecurity   Worried About Charity fundraiser in the Last Year: Never true   [place] in the Last Year: Never true  Transportation Needs: No Transportation Needs   Lack of Transportation (Medical): No   Lack of Transportation (Non-Medical): No  Physical Activity: Inactive   Days of Exercise per Week: 0 days   Minutes of Exercise per Session: 0 min  Stress: No Stress Concern Present   Feeling of Stress : Not at  all  Social Connections: Not on file  Intimate Partner Violence: Not on file     Constitutional: Denies fever, malaise, fatigue, headache or abrupt weight changes.  HEENT: Denies eye pain, eye redness, ear pain, ringing in the ears, wax buildup, runny nose, nasal congestion, bloody nose, or sore throat. Respiratory: Pt reports chronic cough and shortness of breath. Denies difficulty breathing, or sputum production.   Cardiovascular: Pt reports swelling in her feet. Denies chest pain, chest tightness, palpitations or swelling in the hands.  Gastrointestinal: Denies abdominal pain, bloating, constipation, diarrhea or blood in the stool.  GU: Denies urgency, frequency, pain with urination, burning sensation, blood in urine, odor or discharge. Musculoskeletal: Pt reports low back pain, bilateral hip pain. Denies decrease in range of motion, difficulty with gait, muscle pain or joint swelling.  Skin: Pt reports redness and warmth of BLE. Denies rashes, lesions or ulcercations.  Neurological: Pt reports insomnia. Denies dizziness, difficulty with memory, difficulty with speech or problems with balance and coordination.  Psych: Pt has a history of anxiety and depression. Denies SI/HI.  No other specific complaints in a complete review of systems (except as listed in HPI above).  Objective:   Physical Exam   BP (!) 133/58 (BP Location: Left Arm, Patient Position: Sitting, Cuff Size: Large)   Pulse 92   Temp 97.9 F (36.6 C) (Temporal)    Resp (!) 22   Ht 5\' 3"  (1.6 m)   Wt 210 lb 9.6 oz (95.5 kg)   SpO2 93%   BMI 37.31 kg/m   Wt Readings from Last 3 Encounters:  04/12/20 205 lb (93 kg)  04/11/20 205 lb (93 kg)  12/03/19 204 lb (92.5 kg)    General: Appears her stated age, obese, chronically ill appearing,  in NAD. Skin: Warm, dry and intact. Redness without warmth noted of BLE. No noted. HEENT: Head: normal shape and size; Eyes: sclera white and EOMs intact;  Neck:  Neck supple, trachea midline. No masses, lumps or thyromegaly present.  Cardiovascular: Normal rate and rhythm. S1,S2 noted.  No murmur, rubs or gallops noted. 1+ pitting BLE edema. No carotid bruits noted. Pulmonary/Chest: Increased effort with exertion, with positive vesicular breath sounds with bilateral expiratory wheezing noted. No respiratory distress. No rales or ronchi noted.  Abdomen: Soft and nontender. Normal bowel sounds. No distention or masses noted. Liver, spleen and kidneys non palpable. Musculoskeletal: She has difficulty getting from a sitting to a standing position. Gait slow and steady without device. Neurological: Alert and oriented. Cranial nerves II-XII grossly intact. Coordination normal.  Psychiatric: Mood and affect normal. Behavior is normal. Judgment and thought content normal.   BMET    Component Value Date/Time   NA 143 12/03/2019 0947   NA 142 06/24/2015 1102   NA 135 (L) 02/02/2013 1021   K 3.9 12/03/2019 0947   K 3.7 02/02/2013 1021   CL 98 12/03/2019 0947   CL 103 02/02/2013 1021   CO2 37 (H) 12/03/2019 0947   CO2 30 02/02/2013 1021   GLUCOSE 104 (H) 12/03/2019 0947   GLUCOSE 152 (H) 02/02/2013 1021   BUN 11 12/03/2019 0947   BUN 12 06/24/2015 1102   BUN 8 02/02/2013 1021   CREATININE 0.82 12/03/2019 0947   CALCIUM 9.4 12/03/2019 0947   CALCIUM 9.9 02/02/2013 1021   GFRNONAA 72 12/03/2019 0947   GFRAA 84 12/03/2019 0947    Lipid Panel     Component Value Date/Time   CHOL 112 12/03/2019 0947   CHOL  205  (H) 02/03/2015 1022   TRIG 130 12/03/2019 0947   HDL 43 (L) 12/03/2019 0947   HDL 36 (L) 02/03/2015 1022   CHOLHDL 2.6 12/03/2019 0947   LDLCALC 48 12/03/2019 0947    CBC    Component Value Date/Time   WBC 7.8 12/03/2019 0947   RBC 5.51 (H) 12/03/2019 0947   HGB 15.7 (H) 12/03/2019 0947   HGB 15.6 06/24/2015 1102   HCT 50.0 (H) 12/03/2019 0947   HCT 46.6 06/24/2015 1102   PLT 228 12/03/2019 0947   PLT 307 06/24/2015 1102   MCV 90.7 12/03/2019 0947   MCV 93 06/24/2015 1102   MCV 90 02/02/2013 1021   MCH 28.5 12/03/2019 0947   MCHC 31.4 (L) 12/03/2019 0947   RDW 12.6 12/03/2019 0947   RDW 13.3 06/24/2015 1102   RDW 13.2 02/02/2013 1021   LYMPHSABS 2,480 12/03/2019 0947   LYMPHSABS 2.3 06/24/2015 1102   MONOABS 0.7 12/30/2017 0339   EOSABS 289 12/03/2019 0947   EOSABS 0.3 06/24/2015 1102   BASOSABS 101 12/03/2019 0947   BASOSABS 0.1 06/24/2015 1102    Hgb A1C Lab Results  Component Value Date   HGBA1C 6.0 (H) 10/29/2017           Assessment & Plan:   Peripheral Edema:  CMET and BNP today RX for Furosemide 20 mg daily Reinforced DASH diet, elevation and exercise for weight loss Consider repeat echo pending labs  [name], NP This visit occurred during the SARS-CoV-2 public health emergency.  Safety protocols were in place, including screening questions prior to the visit, additional usage of staff PPE, and extensive cleaning of exam room while observing appropriate contact time as indicated for disinfecting solutions.

## 2020-07-25 NOTE — Patient Instructions (Signed)
Edema  Edema is when you have too much fluid in your body or under your skin. Edema may make your legs, feet, and ankles swell up. Swelling is also common in looser tissues, like around your eyes. This is a common condition. It gets more common as you get older. There are many possible causes of edema. Eating too much salt (sodium) and being on your feet or sitting for a long time can cause edema in yourlegs, feet, and ankles. Hot weather may make edema worse. Edema is usually painless. Your skin may look swollen or shiny. Follow these instructions at home: Keep the swollen body part raised (elevated) above the level of your heart when you are sitting or lying down. Do not sit still or stand for a long time. Do not wear tight clothes. Do not wear garters on your upper legs. Exercise your legs. This can help the swelling go down. Wear elastic bandages or support stockings as told by your doctor. Eat a low-salt (low-sodium) diet to reduce fluid as told by your doctor. Depending on the cause of your swelling, you may need to limit how much fluid you drink (fluid restriction). Take over-the-counter and prescription medicines only as told by your doctor. Contact a doctor if: Treatment is not working. You have heart, liver, or kidney disease and have symptoms of edema. You have sudden and unexplained weight gain. Get help right away if: You have shortness of breath or chest pain. You cannot breathe when you lie down. You have pain, redness, or warmth in the swollen areas. You have heart, liver, or kidney disease and get edema all of a sudden. You have a fever and your symptoms get worse all of a sudden. Summary Edema is when you have too much fluid in your body or under your skin. Edema may make your legs, feet, and ankles swell up. Swelling is also common in looser tissues, like around your eyes. Raise (elevate) the swollen body part above the level of your heart when you are sitting or lying  down. Follow your doctor's instructions about diet and how much fluid you can drink (fluid restriction). This information is not intended to replace advice given to you by your health care provider. Make sure you discuss any questions you have with your healthcare provider. Document Revised: 11/18/2019 Document Reviewed: 11/18/2019 Elsevier Patient Education  2022 Elsevier Inc.  

## 2020-07-26 DIAGNOSIS — E6609 Other obesity due to excess calories: Secondary | ICD-10-CM | POA: Insufficient documentation

## 2020-07-26 DIAGNOSIS — L409 Psoriasis, unspecified: Secondary | ICD-10-CM | POA: Insufficient documentation

## 2020-07-26 DIAGNOSIS — Z6837 Body mass index (BMI) 37.0-37.9, adult: Secondary | ICD-10-CM | POA: Insufficient documentation

## 2020-07-26 NOTE — Assessment & Plan Note (Signed)
Continue Citalopram, Buspirone, Hydroxyzine and Clonazepam- she declines wean at this time Support offered

## 2020-07-26 NOTE — Assessment & Plan Note (Signed)
Continue Clonazepam at bedtime

## 2020-07-26 NOTE — Assessment & Plan Note (Signed)
Controlled on carvedilol, diltiazem and lisinopril Reinforced DASH diet and exercise weight loss C-Met today

## 2020-07-26 NOTE — Assessment & Plan Note (Signed)
Encourage smoking cessation Continue Breo and Albuterol as prescribed

## 2020-07-26 NOTE — Assessment & Plan Note (Signed)
C-Met and lipid profile today Consider statin therapy if LDL greater than 130 Encouraged her to consume a low-fat diet Encouraged daily aspirin

## 2020-07-26 NOTE — Assessment & Plan Note (Signed)
Encouraged daily stretching, core strengthening and exercise for weight loss Continue Tylenol OTC

## 2020-07-26 NOTE — Assessment & Plan Note (Signed)
A1C today Encouraged her to consume a low carb diet and exercise for weight loss Will monitor

## 2020-07-26 NOTE — Assessment & Plan Note (Signed)
C-Met and lipid profile today Consider statin therapy if LDL greater than 130 Encouraged her to consume a low-fat diet  

## 2020-07-26 NOTE — Assessment & Plan Note (Signed)
TSH and Free T4 today Will adjust Levothyroxine if needed based on labs 

## 2020-07-26 NOTE — Assessment & Plan Note (Signed)
Continue [name] She will continue to see dermatology, will follow

## 2020-07-26 NOTE — Assessment & Plan Note (Signed)
Encouraged low carb, low fat diet and exercise for weight loss

## 2020-07-26 NOTE — Assessment & Plan Note (Signed)
C-Met and lipid profile today Consider statin therapy if LDL greater than 130 Encouraged her to consume a low-fat diet Encouraged daily aspirin She declines referral for vascular surgery at this time

## 2020-07-28 ENCOUNTER — Other Ambulatory Visit: Payer: Self-pay | Admitting: Family Medicine

## 2020-07-28 ENCOUNTER — Other Ambulatory Visit: Payer: PPO

## 2020-07-28 ENCOUNTER — Other Ambulatory Visit: Payer: Self-pay

## 2020-07-28 DIAGNOSIS — E039 Hypothyroidism, unspecified: Secondary | ICD-10-CM

## 2020-07-28 DIAGNOSIS — I10 Essential (primary) hypertension: Secondary | ICD-10-CM | POA: Diagnosis not present

## 2020-07-28 DIAGNOSIS — J42 Unspecified chronic bronchitis: Secondary | ICD-10-CM

## 2020-07-28 DIAGNOSIS — N1831 Chronic kidney disease, stage 3a: Secondary | ICD-10-CM

## 2020-07-28 DIAGNOSIS — R7303 Prediabetes: Secondary | ICD-10-CM

## 2020-07-28 DIAGNOSIS — R609 Edema, unspecified: Secondary | ICD-10-CM

## 2020-07-28 DIAGNOSIS — E782 Mixed hyperlipidemia: Secondary | ICD-10-CM

## 2020-07-29 ENCOUNTER — Other Ambulatory Visit: Payer: Self-pay | Admitting: Family Medicine

## 2020-07-29 DIAGNOSIS — F410 Panic disorder [episodic paroxysmal anxiety] without agoraphobia: Secondary | ICD-10-CM

## 2020-07-29 LAB — COMPREHENSIVE METABOLIC PANEL
AG Ratio: 1.4 (calc) (ref 1.0–2.5)
ALT: 11 U/L (ref 6–29)
AST: 21 U/L (ref 10–35)
Albumin: 3.9 g/dL (ref 3.6–5.1)
Alkaline phosphatase (APISO): 89 U/L (ref 37–153)
BUN/Creatinine Ratio: 11 (calc) (ref 6–22)
BUN: 11 mg/dL (ref 7–25)
CO2: 34 mmol/L — ABNORMAL HIGH (ref 20–32)
Calcium: 9.2 mg/dL (ref 8.6–10.4)
Chloride: 96 mmol/L — ABNORMAL LOW (ref 98–110)
Creat: 1 mg/dL — ABNORMAL HIGH (ref 0.60–0.93)
Globulin: 2.7 g/dL (calc) (ref 1.9–3.7)
Glucose, Bld: 106 mg/dL — ABNORMAL HIGH (ref 65–99)
Potassium: 4 mmol/L (ref 3.5–5.3)
Sodium: 139 mmol/L (ref 135–146)
Total Bilirubin: 0.7 mg/dL (ref 0.2–1.2)
Total Protein: 6.6 g/dL (ref 6.1–8.1)

## 2020-07-29 LAB — CBC
HCT: 48.8 % — ABNORMAL HIGH (ref 35.0–45.0)
Hemoglobin: 15.5 g/dL (ref 11.7–15.5)
MCH: 29.9 pg (ref 27.0–33.0)
MCHC: 31.8 g/dL — ABNORMAL LOW (ref 32.0–36.0)
MCV: 94.2 fL (ref 80.0–100.0)
MPV: 10.8 fL (ref 7.5–12.5)
Platelets: 238 10*3/uL (ref 140–400)
RBC: 5.18 10*6/uL — ABNORMAL HIGH (ref 3.80–5.10)
RDW: 12.6 % (ref 11.0–15.0)
WBC: 7.5 10*3/uL (ref 3.8–10.8)

## 2020-07-29 LAB — HEMOGLOBIN A1C
Hgb A1c MFr Bld: 6.1 % of total Hgb — ABNORMAL HIGH (ref <5.7)
Mean Plasma Glucose: 128 mg/dL
eAG (mmol/L): 7.1 mmol/L

## 2020-07-29 LAB — LIPID PANEL
Cholesterol: 197 mg/dL (ref <200)
HDL: 41 mg/dL — ABNORMAL LOW (ref >=50)
LDL Cholesterol (Calc): 124 mg/dL (calc) — ABNORMAL HIGH
Non-HDL Cholesterol (Calc): 156 mg/dL (calc) — ABNORMAL HIGH (ref <130)
Total CHOL/HDL Ratio: 4.8 (calc) (ref <5.0)
Triglycerides: 205 mg/dL — ABNORMAL HIGH (ref <150)

## 2020-07-29 LAB — T4, FREE: Free T4: 1.6 ng/dL (ref 0.8–1.8)

## 2020-07-29 LAB — BRAIN NATRIURETIC PEPTIDE: Brain Natriuretic Peptide: 45 pg/mL (ref <100)

## 2020-07-29 LAB — TSH: TSH: 16.38 mIU/L — ABNORMAL HIGH (ref 0.40–4.50)

## 2020-07-29 NOTE — Telephone Encounter (Signed)
Requested medication (s) are due for refill today:no  Requested medication (s) are on the active medication list:  yes   Last refill:  07/29/2020  Future visit scheduled: yes  Notes to clinic: this refill cannot be delegated    Requested Prescriptions  Pending Prescriptions Disp Refills   clonazePAM (KLONOPIN) 1 MG tablet [Pharmacy Med Name: clonazepam 1 mg tablet] 75 tablet 2    Sig: TAKE ONE TABLET BY MOUTH EVERY MORNING, ONE-HALF TABLET IN THE AFTERNOON & ONE TABLET AT BEDTIME      Not Delegated - Psychiatry:  Anxiolytics/Hypnotics Failed - 07/29/2020 10:51 AM      Failed - This refill cannot be delegated      Failed - Urine Drug Screen completed in last 360 days      Passed - Valid encounter within last 6 months    Recent Outpatient Visits           4 days ago Peripheral edema   [facility] [place], Coralie Keens, NP   3 months ago COPD (chronic obstructive pulmonary disease) with chronic bronchitis ([place])   [place], DO   7 months ago Right arm pain   [facility], Lupita Raider, FNP   1 year ago Adult hypothyroidism   [facility], Lupita Raider, FNP   1 year ago Need for Streptococcus pneumoniae vaccination   [facility], Lupita Raider, FNP       Future Appointments             In 2 months Baity, Coralie Keens, NP [facility], [place]   In 8 months  [facility], [facility]

## 2020-07-29 NOTE — Telephone Encounter (Signed)
She has 1 more refill on file at the pharmacy

## 2020-08-01 DIAGNOSIS — N1831 Chronic kidney disease, stage 3a: Secondary | ICD-10-CM | POA: Insufficient documentation

## 2020-08-02 ENCOUNTER — Other Ambulatory Visit: Payer: Self-pay | Admitting: Internal Medicine

## 2020-08-02 DIAGNOSIS — J449 Chronic obstructive pulmonary disease, unspecified: Secondary | ICD-10-CM

## 2020-08-02 MED ORDER — FLUTICASONE FUROATE-VILANTEROL 200-25 MCG/INH IN AEPB
1.0000 | INHALATION_SPRAY | Freq: Every day | RESPIRATORY_TRACT | 2 refills | Status: DC
Start: 2020-08-02 — End: 2021-01-24

## 2020-08-04 MED ORDER — PRAVASTATIN SODIUM 10 MG PO TABS: 10.0000 mg | ORAL_TABLET | Freq: Every day | ORAL | 0 refills | Status: DC

## 2020-08-04 MED ORDER — LEVOTHYROXINE SODIUM 175 MCG PO TABS: 175.0000 ug | ORAL_TABLET | Freq: Every day | ORAL | 0 refills | Status: DC

## 2020-08-04 NOTE — Addendum Note (Signed)
Addended by: Jearld Fenton on: 08/04/2020 08:32 AM   Modules accepted: Orders

## 2020-08-24 ENCOUNTER — Other Ambulatory Visit: Payer: Self-pay | Admitting: Internal Medicine

## 2020-08-24 ENCOUNTER — Other Ambulatory Visit: Payer: Self-pay | Admitting: Family Medicine

## 2020-08-24 DIAGNOSIS — F3341 Major depressive disorder, recurrent, in partial remission: Secondary | ICD-10-CM

## 2020-08-24 DIAGNOSIS — F410 Panic disorder [episodic paroxysmal anxiety] without agoraphobia: Secondary | ICD-10-CM

## 2020-08-24 MED ORDER — CITALOPRAM HYDROBROMIDE 40 MG PO TABS: 40.0000 mg | ORAL_TABLET | Freq: Every day | ORAL | 0 refills | Status: DC

## 2020-08-24 NOTE — Telephone Encounter (Signed)
Requested medication (s) are due for refill today: yes   Requested medication (s) are on the active medication list:yes   Last refill:  07/29/2020  Future visit scheduled: yes   Notes to clinic:  this refill cannot be delegated    Requested Prescriptions  Pending Prescriptions Disp Refills   clonazePAM (KLONOPIN) 1 MG tablet [Pharmacy Med Name: clonazepam 1 mg tablet] 75 tablet 2    Sig: TAKE ONE TABLET BY MOUTH EVERY MORNING, ONE-HALF TABLET IN THE AFTERNOON & ONE TABLET AT BEDTIME      Not Delegated - Psychiatry:  Anxiolytics/Hypnotics Failed - 08/24/2020 11:55 AM      Failed - This refill cannot be delegated      Failed - Urine Drug Screen completed in last 360 days      Passed - Valid encounter within last 6 months    Recent Outpatient Visits           1 month ago Peripheral edema   [place], Coralie Keens, NP   4 months ago COPD (chronic obstructive pulmonary disease) with chronic bronchitis ([place])   [place], DO   8 months ago Right arm pain   [facility], Lupita Raider, FNP   1 year ago Adult hypothyroidism   [facility], Lupita Raider, FNP   1 year ago Need for Streptococcus pneumoniae vaccination   [facility], Lupita Raider, FNP       Future Appointments             In 2 months Baity, Coralie Keens, NP [facility], [place]   In 7 months  [facility], [facility]

## 2020-08-24 NOTE — Addendum Note (Signed)
Addended by: Jefferson Fuel on: 08/24/2020 08:12 AM   Modules accepted: Orders

## 2020-08-24 NOTE — Telephone Encounter (Signed)
Medication Refill - Medication: citalopram (CELEXA) 40 MG tablet  Has the patient contacted their pharmacy? Yes.   (Agent: If no, request that the patient contact the pharmacy for the refill.) (Agent: If yes, when and what did the pharmacy advise?)  Preferred Pharmacy (with phone number or street name): [facility]    Agent: Please be advised that RX refills may take up to 3 business days. We ask that you follow-up with your pharmacy.

## 2020-09-16 ENCOUNTER — Other Ambulatory Visit: Payer: Self-pay | Admitting: Internal Medicine

## 2020-09-16 DIAGNOSIS — F410 Panic disorder [episodic paroxysmal anxiety] without agoraphobia: Secondary | ICD-10-CM

## 2020-09-16 MED ORDER — BUSPIRONE HCL 7.5 MG PO TABS: 7.5000 mg | ORAL_TABLET | Freq: Three times a day (TID) | ORAL | 0 refills | Status: DC

## 2020-09-16 NOTE — Telephone Encounter (Signed)
Medication Refill - Medication: buspar 7.5 mg  Has the patient contacted their pharmacy? Yes.   Pharmacy told pt that they fax the request and will refax again. Pt has an appt on 10-25-2020 with [facility] Preferred Pharmacy (with phone number or street name): [facility] [address] in [place]: Please be advised that RX refills may take up to 3 business days. We ask that you follow-up with your pharmacy.

## 2020-09-21 ENCOUNTER — Other Ambulatory Visit: Payer: Self-pay | Admitting: Internal Medicine

## 2020-09-21 DIAGNOSIS — F410 Panic disorder [episodic paroxysmal anxiety] without agoraphobia: Secondary | ICD-10-CM

## 2020-09-21 NOTE — Telephone Encounter (Signed)
Medication Refill - Medication: clonazePAM (KLONOPIN) 1 MG tablet [  Has the patient contacted their pharmacy? Yes.   (Agent: If no, request that the patient contact the pharmacy for the refill.) (Agent: If yes, when and what did the pharmacy advise?)  Preferred Pharmacy (with phone number or street name):  [place], [place], [address]  [place] [address]  Phone: [phone] Fax: [phone]    Agent: Please be advised that RX refills may take up to 3 business days. We ask that you follow-up with your pharmacy.

## 2020-09-21 NOTE — Telephone Encounter (Signed)
Previous request for medication refill sent 09/21/20 at 1504. Future visit in 1 month

## 2020-09-21 NOTE — Telephone Encounter (Signed)
Requested medication (s) are due for refill today:  yes   Requested medication (s) are on the active medication list:  yes  Last refill:  08/24/2020  Future visit scheduled: yes  Notes to clinic:  this refill cannot be delegated    Requested Prescriptions  Pending Prescriptions Disp Refills   clonazePAM (KLONOPIN) 1 MG tablet 75 tablet 0    Sig: TAKE ONE TABLET BY MOUTH EVERY MORNING, ONE-HALF TABLET IN THE AFTERNOON & ONE TABLET AT BEDTIME     Not Delegated - Psychiatry:  Anxiolytics/Hypnotics Failed - 09/21/2020  3:04 PM      Failed - This refill cannot be delegated      Failed - Urine Drug Screen completed in last 360 days      Passed - Valid encounter within last 6 months    Recent Outpatient Visits           1 month ago Peripheral edema   [facility] [place], Coralie Keens, NP   5 months ago COPD (chronic obstructive pulmonary disease) with chronic bronchitis ([place])   [facility], Devonne Doughty, DO   9 months ago Right arm pain   [facility], Lupita Raider, FNP   1 year ago Adult hypothyroidism   [facility], Lupita Raider, FNP   1 year ago Need for Streptococcus pneumoniae vaccination   [facility], Lupita Raider, FNP       Future Appointments             In 1 month Baity, Coralie Keens, NP [facility], [place]   In 6 months  [facility], [facility]

## 2020-09-22 ENCOUNTER — Other Ambulatory Visit: Payer: Self-pay | Admitting: Internal Medicine

## 2020-09-22 ENCOUNTER — Other Ambulatory Visit: Payer: Self-pay | Admitting: Family Medicine

## 2020-09-22 DIAGNOSIS — I10 Essential (primary) hypertension: Secondary | ICD-10-CM

## 2020-09-22 MED ORDER — CLONAZEPAM 1 MG PO TABS: ORAL_TABLET | ORAL | 0 refills | Status: DC

## 2020-09-22 NOTE — Telephone Encounter (Signed)
Future visit in 1  Month

## 2020-09-23 ENCOUNTER — Other Ambulatory Visit: Payer: Self-pay

## 2020-09-23 MED ORDER — LEVOTHYROXINE SODIUM 175 MCG PO TABS: 175.0000 ug | ORAL_TABLET | Freq: Every day | ORAL | 2 refills | Status: DC

## 2020-10-17 ENCOUNTER — Other Ambulatory Visit: Payer: Self-pay | Admitting: Internal Medicine

## 2020-10-17 ENCOUNTER — Telehealth: Payer: Self-pay | Admitting: Internal Medicine

## 2020-10-17 DIAGNOSIS — F410 Panic disorder [episodic paroxysmal anxiety] without agoraphobia: Secondary | ICD-10-CM

## 2020-10-17 NOTE — Telephone Encounter (Signed)
Medication: clonazePAM (KLONOPIN) 1 MG tablet [id number]   Has the patient contacted their pharmacy? YES  (Agent: If no, request that the patient contact the pharmacy for the refill.) (Agent: If yes, when and what did the pharmacy advise?)  Preferred Pharmacy (with phone number or street name): [place], [place], [place] [address] [place] [address] Phone: [phone] Fax: [phone] Hours: Not open 24 hours    Agent: Please be advised that RX refills may take up to 3 business days. We ask that you follow-up with your pharmacy.

## 2020-10-17 NOTE — Telephone Encounter (Signed)
Pt is calling to check on this request. Pt states she took her last pill yesterday. Please advise

## 2020-10-18 ENCOUNTER — Other Ambulatory Visit: Payer: Self-pay

## 2020-10-18 MED ORDER — CLONAZEPAM 1 MG PO TABS: ORAL_TABLET | ORAL | 0 refills | Status: DC

## 2020-10-18 NOTE — Telephone Encounter (Signed)
Requested medications are due for refill today.  yes  Requested medications are on the active medications list.  yes  Last refill. 09/22/2020  Future visit scheduled.   yes  Notes to clinic.  Medication not delegated.

## 2020-10-19 ENCOUNTER — Other Ambulatory Visit: Payer: Self-pay | Admitting: Internal Medicine

## 2020-10-19 DIAGNOSIS — F410 Panic disorder [episodic paroxysmal anxiety] without agoraphobia: Secondary | ICD-10-CM

## 2020-10-19 MED ORDER — CLONAZEPAM 1 MG PO TABS: ORAL_TABLET | ORAL | 0 refills | Status: DC

## 2020-10-19 NOTE — Telephone Encounter (Signed)
She can have this filled on the 17th, new Rx sent to pharmacy.  Not sure where the date of 9/26 came from.

## 2020-10-19 NOTE — Telephone Encounter (Signed)
Pt called stating that she had this prescription filled on 09/22/20. She states that she will be out before the weekend and is requesting to have this sent in to be refilled sooner than 10/31/20. Please advise .

## 2020-10-20 NOTE — Telephone Encounter (Signed)
I attempted to contact the patient on both phone numbers listed, no answer. No voicemail.

## 2020-10-22 ENCOUNTER — Other Ambulatory Visit: Payer: Self-pay | Admitting: Family Medicine

## 2020-10-22 ENCOUNTER — Other Ambulatory Visit: Payer: Self-pay | Admitting: Internal Medicine

## 2020-10-22 DIAGNOSIS — J42 Unspecified chronic bronchitis: Secondary | ICD-10-CM

## 2020-10-22 DIAGNOSIS — I10 Essential (primary) hypertension: Secondary | ICD-10-CM

## 2020-10-23 NOTE — Telephone Encounter (Signed)
Requested Prescriptions  Pending Prescriptions Disp Refills  . furosemide (LASIX) 20 MG tablet [Pharmacy Med Name: furosemide 20 mg tablet] 30 tablet 0    Sig: TAKE ONE TABLET BY MOUTH ONCE DAILY     Cardiovascular:  Diuretics - Loop Failed - 10/22/2020  9:57 AM      Failed - Cr in normal range and within 360 days    Creat  Date Value Ref Range Status  07/28/2020 1.00 (H) 0.60 - 0.93 mg/dL Final    Comment:    For patients >71 years of age, the reference limit for Creatinine is approximately 13% higher for people identified as African-American. .          Passed - K in normal range and within 360 days    Potassium  Date Value Ref Range Status  07/28/2020 4.0 3.5 - 5.3 mmol/L Final  02/02/2013 3.7 3.5 - 5.1 mmol/L Final         Passed - Ca in normal range and within 360 days    Calcium  Date Value Ref Range Status  07/28/2020 9.2 8.6 - 10.4 mg/dL Final   Calcium, Total  Date Value Ref Range Status  02/02/2013 9.9 8.5 - 10.1 mg/dL Final         Passed - Na in normal range and within 360 days    Sodium  Date Value Ref Range Status  07/28/2020 139 135 - 146 mmol/L Final  06/24/2015 142 134 - 144 mmol/L Final  02/02/2013 135 (L) 136 - 145 mmol/L Final         Passed - Last BP in normal range    BP Readings from Last 1 Encounters:  07/25/20 (!) 133/58         Passed - Valid encounter within last 6 months    Recent Outpatient Visits          3 months ago Peripheral edema   [place], Coralie Keens, NP   6 months ago COPD (chronic obstructive pulmonary disease) with chronic bronchitis ([place])   [facility], Devonne Doughty, DO   10 months ago Right arm pain   [facility], Lupita Raider, FNP   1 year ago Adult hypothyroidism   [facility], Lupita Raider, FNP   1 year ago Need for Streptococcus pneumoniae vaccination   [facility], Lupita Raider, FNP      Future  Appointments            In 2 days Baity, Coralie Keens, NP [facility], [place]   In 5 months  [facility], [facility]

## 2020-10-23 NOTE — Telephone Encounter (Signed)
Requested Prescriptions  Pending Prescriptions Disp Refills  . lisinopril (ZESTRIL) 10 MG tablet [Pharmacy Med Name: lisinopril 10 mg tablet] 30 tablet 0    Sig: TAKE ONE TABLET BY MOUTH ONCE DAILY     Cardiovascular:  ACE Inhibitors Failed - 10/22/2020  9:57 AM      Failed - Cr in normal range and within 180 days    Creat  Date Value Ref Range Status  07/28/2020 1.00 (H) 0.60 - 0.93 mg/dL Final    Comment:    For patients >71 years of age, the reference limit for Creatinine is approximately 13% higher for people identified as African-American. .          Passed - K in normal range and within 180 days    Potassium  Date Value Ref Range Status  07/28/2020 4.0 3.5 - 5.3 mmol/L Final  02/02/2013 3.7 3.5 - 5.1 mmol/L Final         Passed - Patient is not pregnant      Passed - Last BP in normal range    BP Readings from Last 1 Encounters:  07/25/20 (!) 133/58         Passed - Valid encounter within last 6 months    Recent Outpatient Visits          3 months ago Peripheral edema   [place], [place], NP   6 months ago COPD (chronic obstructive pulmonary disease) with chronic bronchitis ([place])   [facility], Devonne Doughty, DO   10 months ago Right arm pain   [facility], Lupita Raider, FNP   1 year ago Adult hypothyroidism   [facility], Lupita Raider, FNP   1 year ago Need for Streptococcus pneumoniae vaccination   [place], FNP      Future Appointments            In 2 days Baity, Coralie Keens, NP [facility], [place]   In 5 months  [facility], [facility]           . albuterol (VENTOLIN HFA) 108 (90 Base) MCG/ACT inhaler [Pharmacy Med Name: albuterol sulfate HFA 90 mcg/actuation aerosol inhaler] 8.5 g 2    Sig: INHALE 2 PUFFS BY MOUTH INTO THE LUNGS EVERY 6 HOURS AS NEEDED FOR WHEEZING OR SHORTNESS     Pulmonology:  Beta  Agonists Failed - 10/22/2020  9:57 AM      Failed - One inhaler should last at least one month. If the patient is requesting refills earlier, contact the patient to check for uncontrolled symptoms.      Passed - Valid encounter within last 12 months    Recent Outpatient Visits          3 months ago Peripheral edema   [facility] [place], [place], NP   6 months ago COPD (chronic obstructive pulmonary disease) with chronic bronchitis [facility])   [facility], Devonne Doughty, DO   10 months ago Right arm pain   [facility], Lupita Raider, FNP   1 year ago Adult hypothyroidism   [place], FNP   1 year ago Need for Streptococcus pneumoniae vaccination   [facility], Lupita Raider, FNP      Future Appointments            In  2 days Jearld Fenton, NP [facility], [place]   In 5 months  [facility], [place]

## 2020-10-25 ENCOUNTER — Ambulatory Visit: Payer: PPO | Admitting: Internal Medicine

## 2020-11-15 ENCOUNTER — Other Ambulatory Visit: Payer: Self-pay | Admitting: Internal Medicine

## 2020-11-15 DIAGNOSIS — F410 Panic disorder [episodic paroxysmal anxiety] without agoraphobia: Secondary | ICD-10-CM

## 2020-11-15 NOTE — Telephone Encounter (Signed)
Requested Prescriptions  Pending Prescriptions Disp Refills  . busPIRone (BUSPAR) 7.5 MG tablet [Pharmacy Med Name: buspirone 7.5 mg tablet] 90 tablet 0    Sig: TAKE ONE TABLET BY MOUTH THREE TIMES DAILY     Psychiatry: Anxiolytics/Hypnotics - Non-controlled Passed - 11/15/2020 10:56 AM      Passed - Valid encounter within last 6 months    Recent Outpatient Visits          3 months ago Peripheral edema   [facility] [place], [place], NP   7 months ago COPD (chronic obstructive pulmonary disease) with chronic bronchitis ([place])   [facility] Olin Hauser, DO   11 months ago Right arm pain   [facility], Lupita Raider, FNP   1 year ago Adult hypothyroidism   [facility], Lupita Raider, FNP   1 year ago Need for Streptococcus pneumoniae vaccination   [facility], Lupita Raider, FNP      Future Appointments            In 5 months [facility], [facility]

## 2020-12-02 ENCOUNTER — Emergency Department: Payer: PPO

## 2020-12-02 ENCOUNTER — Inpatient Hospital Stay
Admission: EM | Admit: 2020-12-02 | Discharge: 2020-12-12 | DRG: 190 | Disposition: A | Discharge status: Skilled Nursing Facility | Payer: PPO | Attending: Internal Medicine | Admitting: Internal Medicine

## 2020-12-02 ENCOUNTER — Other Ambulatory Visit: Payer: Self-pay

## 2020-12-02 DIAGNOSIS — R5381 Other malaise: Secondary | ICD-10-CM | POA: Diagnosis present

## 2020-12-02 DIAGNOSIS — F321 Major depressive disorder, single episode, moderate: Secondary | ICD-10-CM | POA: Diagnosis not present

## 2020-12-02 DIAGNOSIS — J449 Chronic obstructive pulmonary disease, unspecified: Secondary | ICD-10-CM | POA: Diagnosis present

## 2020-12-02 DIAGNOSIS — J9622 Acute and chronic respiratory failure with hypercapnia: Secondary | ICD-10-CM | POA: Diagnosis present

## 2020-12-02 DIAGNOSIS — Z8249 Family history of ischemic heart disease and other diseases of the circulatory system: Secondary | ICD-10-CM

## 2020-12-02 DIAGNOSIS — F17210 Nicotine dependence, cigarettes, uncomplicated: Secondary | ICD-10-CM | POA: Diagnosis not present

## 2020-12-02 DIAGNOSIS — Z79890 Hormone replacement therapy (postmenopausal): Secondary | ICD-10-CM

## 2020-12-02 DIAGNOSIS — I2699 Other pulmonary embolism without acute cor pulmonale: Secondary | ICD-10-CM | POA: Diagnosis not present

## 2020-12-02 DIAGNOSIS — G4700 Insomnia, unspecified: Secondary | ICD-10-CM | POA: Diagnosis not present

## 2020-12-02 DIAGNOSIS — Z6837 Body mass index (BMI) 37.0-37.9, adult: Secondary | ICD-10-CM | POA: Diagnosis not present

## 2020-12-02 DIAGNOSIS — Z881 Allergy status to other antibiotic agents status: Secondary | ICD-10-CM | POA: Diagnosis not present

## 2020-12-02 DIAGNOSIS — I10 Essential (primary) hypertension: Secondary | ICD-10-CM | POA: Diagnosis not present

## 2020-12-02 DIAGNOSIS — Z66 Do not resuscitate: Secondary | ICD-10-CM | POA: Diagnosis present

## 2020-12-02 DIAGNOSIS — J9601 Acute respiratory failure with hypoxia: Secondary | ICD-10-CM | POA: Diagnosis not present

## 2020-12-02 DIAGNOSIS — M62838 Other muscle spasm: Secondary | ICD-10-CM | POA: Diagnosis not present

## 2020-12-02 DIAGNOSIS — R0902 Hypoxemia: Secondary | ICD-10-CM

## 2020-12-02 DIAGNOSIS — Z9981 Dependence on supplemental oxygen: Secondary | ICD-10-CM | POA: Diagnosis not present

## 2020-12-02 DIAGNOSIS — J90 Pleural effusion, not elsewhere classified: Secondary | ICD-10-CM | POA: Diagnosis not present

## 2020-12-02 DIAGNOSIS — I120 Hypertensive chronic kidney disease with stage 5 chronic kidney disease or end stage renal disease: Secondary | ICD-10-CM | POA: Diagnosis not present

## 2020-12-02 DIAGNOSIS — Z825 Family history of asthma and other chronic lower respiratory diseases: Secondary | ICD-10-CM

## 2020-12-02 DIAGNOSIS — F419 Anxiety disorder, unspecified: Secondary | ICD-10-CM | POA: Diagnosis present

## 2020-12-02 DIAGNOSIS — F32A Depression, unspecified: Secondary | ICD-10-CM | POA: Diagnosis not present

## 2020-12-02 DIAGNOSIS — J432 Centrilobular emphysema: Principal | ICD-10-CM | POA: Diagnosis present

## 2020-12-02 DIAGNOSIS — Z885 Allergy status to narcotic agent status: Secondary | ICD-10-CM

## 2020-12-02 DIAGNOSIS — R0602 Shortness of breath: Secondary | ICD-10-CM | POA: Diagnosis not present

## 2020-12-02 DIAGNOSIS — R531 Weakness: Secondary | ICD-10-CM | POA: Diagnosis not present

## 2020-12-02 DIAGNOSIS — Z20822 Contact with and (suspected) exposure to covid-19: Secondary | ICD-10-CM | POA: Diagnosis present

## 2020-12-02 DIAGNOSIS — Z79899 Other long term (current) drug therapy: Secondary | ICD-10-CM

## 2020-12-02 DIAGNOSIS — M1990 Unspecified osteoarthritis, unspecified site: Secondary | ICD-10-CM | POA: Diagnosis not present

## 2020-12-02 DIAGNOSIS — E039 Hypothyroidism, unspecified: Secondary | ICD-10-CM | POA: Diagnosis not present

## 2020-12-02 DIAGNOSIS — E785 Hyperlipidemia, unspecified: Secondary | ICD-10-CM | POA: Diagnosis not present

## 2020-12-02 DIAGNOSIS — L409 Psoriasis, unspecified: Secondary | ICD-10-CM | POA: Diagnosis present

## 2020-12-02 DIAGNOSIS — I739 Peripheral vascular disease, unspecified: Secondary | ICD-10-CM | POA: Diagnosis not present

## 2020-12-02 DIAGNOSIS — J441 Chronic obstructive pulmonary disease with (acute) exacerbation: Secondary | ICD-10-CM

## 2020-12-02 DIAGNOSIS — Z7401 Bed confinement status: Secondary | ICD-10-CM | POA: Diagnosis not present

## 2020-12-02 DIAGNOSIS — F410 Panic disorder [episodic paroxysmal anxiety] without agoraphobia: Secondary | ICD-10-CM

## 2020-12-02 DIAGNOSIS — I70219 Atherosclerosis of native arteries of extremities with intermittent claudication, unspecified extremity: Secondary | ICD-10-CM | POA: Diagnosis present

## 2020-12-02 DIAGNOSIS — E6609 Other obesity due to excess calories: Secondary | ICD-10-CM

## 2020-12-02 DIAGNOSIS — K219 Gastro-esophageal reflux disease without esophagitis: Secondary | ICD-10-CM | POA: Diagnosis present

## 2020-12-02 DIAGNOSIS — J9621 Acute and chronic respiratory failure with hypoxia: Secondary | ICD-10-CM | POA: Diagnosis present

## 2020-12-02 DIAGNOSIS — E7800 Pure hypercholesterolemia, unspecified: Secondary | ICD-10-CM | POA: Diagnosis not present

## 2020-12-02 DIAGNOSIS — R0689 Other abnormalities of breathing: Secondary | ICD-10-CM | POA: Diagnosis not present

## 2020-12-02 DIAGNOSIS — N1831 Chronic kidney disease, stage 3a: Secondary | ICD-10-CM | POA: Diagnosis not present

## 2020-12-02 LAB — COMPREHENSIVE METABOLIC PANEL
ALT: 9 U/L (ref 0–44)
AST: 14 U/L — ABNORMAL LOW (ref 15–41)
Albumin: 3.3 g/dL — ABNORMAL LOW (ref 3.5–5.0)
Alkaline Phosphatase: 90 U/L (ref 38–126)
Anion gap: 8 (ref 5–15)
BUN: 14 mg/dL (ref 8–23)
CO2: 41 mmol/L — ABNORMAL HIGH (ref 22–32)
Calcium: 8.8 mg/dL — ABNORMAL LOW (ref 8.9–10.3)
Chloride: 92 mmol/L — ABNORMAL LOW (ref 98–111)
Creatinine, Ser: 0.97 mg/dL (ref 0.44–1.00)
GFR, Estimated: >60 mL/min (ref >60)
Glucose, Bld: 163 mg/dL — ABNORMAL HIGH (ref 70–99)
Potassium: 3.5 mmol/L (ref 3.5–5.1)
Sodium: 141 mmol/L (ref 135–145)
Total Bilirubin: 0.8 mg/dL (ref 0.3–1.2)
Total Protein: 6.7 g/dL (ref 6.5–8.1)

## 2020-12-02 LAB — TROPONIN I (HIGH SENSITIVITY): Troponin I (High Sensitivity): 9 ng/L (ref <18)

## 2020-12-02 LAB — CBC WITH DIFFERENTIAL/PLATELET
Abs Immature Granulocytes: 0.03 10*3/uL (ref 0.00–0.07)
Basophils Absolute: 0.1 10*3/uL (ref 0.0–0.1)
Basophils Relative: 1 %
Eosinophils Absolute: 0.2 10*3/uL (ref 0.0–0.5)
Eosinophils Relative: 3 %
HCT: 52.4 % — ABNORMAL HIGH (ref 36.0–46.0)
Hemoglobin: 15.7 g/dL — ABNORMAL HIGH (ref 12.0–15.0)
Immature Granulocytes: 0 %
Lymphocytes Relative: 25 %
Lymphs Abs: 2.2 10*3/uL (ref 0.7–4.0)
MCH: 28.6 pg (ref 26.0–34.0)
MCHC: 30 g/dL (ref 30.0–36.0)
MCV: 95.4 fL (ref 80.0–100.0)
Monocytes Absolute: 0.8 10*3/uL (ref 0.1–1.0)
Monocytes Relative: 9 %
Neutro Abs: 5.3 10*3/uL (ref 1.7–7.7)
Neutrophils Relative %: 62 %
Platelets: 225 10*3/uL (ref 150–400)
RBC: 5.49 MIL/uL — ABNORMAL HIGH (ref 3.87–5.11)
RDW: 14.5 % (ref 11.5–15.5)
WBC: 8.6 10*3/uL (ref 4.0–10.5)
nRBC: 0 % (ref 0.0–0.2)

## 2020-12-02 LAB — RESP PANEL BY RT-PCR (FLU A&B, COVID) ARPGX2
Influenza A by PCR: NEGATIVE
Influenza B by PCR: NEGATIVE
SARS Coronavirus 2 by RT PCR: NEGATIVE

## 2020-12-02 LAB — BRAIN NATRIURETIC PEPTIDE: B Natriuretic Peptide: 285.7 pg/mL — ABNORMAL HIGH (ref 0.0–100.0)

## 2020-12-02 MED ORDER — LEVOTHYROXINE SODIUM 175 MCG PO TABS
175.0000 ug | ORAL_TABLET | Freq: Every day | ORAL | Status: DC
  Administered 2020-12-03 – 2020-12-12 (×9): 175 ug via ORAL
  Filled 2020-12-02 (×12): qty 1

## 2020-12-02 MED ORDER — PREDNISONE 20 MG PO TABS
40.0000 mg | ORAL_TABLET | Freq: Every day | ORAL | Status: AC
Start: 2020-12-04 — End: 2020-12-07
  Administered 2020-12-04 – 2020-12-07 (×4): 40 mg via ORAL
  Filled 2020-12-02 (×4): qty 2

## 2020-12-02 MED ORDER — ONDANSETRON HCL 4 MG/2ML IJ SOLN: 4.0000 mg | Freq: Four times a day (QID) | INTRAMUSCULAR | Status: DC | PRN

## 2020-12-02 MED ORDER — IBUPROFEN 400 MG PO TABS
400.0000 mg | ORAL_TABLET | Freq: Four times a day (QID) | ORAL | Status: DC | PRN
  Administered 2020-12-03 – 2020-12-11 (×6): 400 mg via ORAL
  Filled 2020-12-02 (×6): qty 1

## 2020-12-02 MED ORDER — LISINOPRIL 10 MG PO TABS
10.0000 mg | ORAL_TABLET | Freq: Every day | ORAL | Status: DC
  Administered 2020-12-03 – 2020-12-12 (×9): 10 mg via ORAL
  Filled 2020-12-02 (×10): qty 1

## 2020-12-02 MED ORDER — CITALOPRAM HYDROBROMIDE 20 MG PO TABS
40.0000 mg | ORAL_TABLET | Freq: Every day | ORAL | Status: DC
  Administered 2020-12-03 – 2020-12-12 (×10): 40 mg via ORAL
  Filled 2020-12-02 (×10): qty 2

## 2020-12-02 MED ORDER — IPRATROPIUM-ALBUTEROL 0.5-2.5 (3) MG/3ML IN SOLN
3.0000 mL | Freq: Four times a day (QID) | RESPIRATORY_TRACT | Status: DC
  Administered 2020-12-02 – 2020-12-03 (×4): 3 mL via RESPIRATORY_TRACT
  Filled 2020-12-02 (×5): qty 3

## 2020-12-02 MED ORDER — ONDANSETRON HCL 4 MG PO TABS
4.0000 mg | ORAL_TABLET | Freq: Four times a day (QID) | ORAL | Status: DC | PRN
  Administered 2020-12-05: 04:00:00 4 mg via ORAL
  Filled 2020-12-02: qty 1

## 2020-12-02 MED ORDER — FLUTICASONE FUROATE-VILANTEROL 200-25 MCG/INH IN AEPB: 1.0000 | INHALATION_SPRAY | Freq: Every day | RESPIRATORY_TRACT | Status: DC

## 2020-12-02 MED ORDER — FLUTICASONE FUROATE-VILANTEROL 200-25 MCG/ACT IN AEPB
1.0000 | INHALATION_SPRAY | Freq: Every day | RESPIRATORY_TRACT | Status: DC
  Administered 2020-12-03 – 2020-12-12 (×10): 1 via RESPIRATORY_TRACT
  Filled 2020-12-02: qty 28

## 2020-12-02 MED ORDER — CARVEDILOL 3.125 MG PO TABS
3.1250 mg | ORAL_TABLET | Freq: Two times a day (BID) | ORAL | Status: DC
  Administered 2020-12-03 – 2020-12-12 (×19): 3.125 mg via ORAL
  Filled 2020-12-02 (×18): qty 1

## 2020-12-02 MED ORDER — SODIUM CHLORIDE 0.9% FLUSH
3.0000 mL | Freq: Two times a day (BID) | INTRAVENOUS | Status: DC
  Administered 2020-12-02 – 2020-12-10 (×17): 3 mL via INTRAVENOUS

## 2020-12-02 MED ORDER — ALBUTEROL SULFATE (2.5 MG/3ML) 0.083% IN NEBU
15.0000 mg | INHALATION_SOLUTION | Freq: Once | RESPIRATORY_TRACT | Status: AC
  Administered 2020-12-02: 15 mg via RESPIRATORY_TRACT
  Filled 2020-12-02 (×2): qty 18

## 2020-12-02 MED ORDER — POLYETHYLENE GLYCOL 3350 17 G PO PACK: 17.0000 g | PACK | Freq: Every day | ORAL | Status: DC | PRN

## 2020-12-02 MED ORDER — CLONAZEPAM 0.5 MG PO TABS
0.5000 mg | ORAL_TABLET | Freq: Three times a day (TID) | ORAL | Status: DC | PRN
  Administered 2020-12-02: 1 mg via ORAL
  Administered 2020-12-03 (×2): 0.5 mg via ORAL
  Administered 2020-12-04 – 2020-12-07 (×7): 1 mg via ORAL
  Administered 2020-12-08: 12:00:00 0.5 mg via ORAL
  Administered 2020-12-08 – 2020-12-11 (×5): 1 mg via ORAL
  Filled 2020-12-02: qty 1
  Filled 2020-12-02 (×4): qty 2
  Filled 2020-12-02: qty 1
  Filled 2020-12-02 (×9): qty 2
  Filled 2020-12-02: qty 1

## 2020-12-02 MED ORDER — DILTIAZEM HCL ER COATED BEADS 120 MG PO CP24
120.0000 mg | ORAL_CAPSULE | Freq: Every day | ORAL | Status: DC
  Administered 2020-12-03 – 2020-12-12 (×10): 120 mg via ORAL
  Filled 2020-12-02 (×11): qty 1

## 2020-12-02 MED ORDER — SODIUM CHLORIDE 0.9 % IV SOLN
1.0000 g | INTRAVENOUS | Status: AC
  Administered 2020-12-02 – 2020-12-06 (×5): 1 g via INTRAVENOUS
  Filled 2020-12-02 (×3): qty 1
  Filled 2020-12-02: qty 10
  Filled 2020-12-02: qty 1

## 2020-12-02 MED ORDER — AZITHROMYCIN 500 MG PO TABS
500.0000 mg | ORAL_TABLET | Freq: Once | ORAL | Status: AC
  Administered 2020-12-02: 500 mg via ORAL
  Filled 2020-12-02: qty 1

## 2020-12-02 MED ORDER — TRAZODONE HCL 50 MG PO TABS
50.0000 mg | ORAL_TABLET | Freq: Every evening | ORAL | Status: DC | PRN
  Administered 2020-12-03: 50 mg via ORAL
  Filled 2020-12-02: qty 1

## 2020-12-02 MED ORDER — METHYLPREDNISOLONE SODIUM SUCC 125 MG IJ SOLR
125.0000 mg | Freq: Two times a day (BID) | INTRAMUSCULAR | Status: AC
  Administered 2020-12-02 – 2020-12-03 (×2): 125 mg via INTRAVENOUS
  Filled 2020-12-02 (×2): qty 2

## 2020-12-02 MED ORDER — BUSPIRONE HCL 15 MG PO TABS
7.5000 mg | ORAL_TABLET | Freq: Three times a day (TID) | ORAL | Status: DC
  Administered 2020-12-02 – 2020-12-12 (×28): 7.5 mg via ORAL
  Filled 2020-12-02 (×24): qty 1
  Filled 2020-12-02: qty 2
  Filled 2020-12-02 (×7): qty 1

## 2020-12-02 MED ORDER — ENOXAPARIN SODIUM 40 MG/0.4ML IJ SOSY
40.0000 mg | PREFILLED_SYRINGE | INTRAMUSCULAR | Status: DC
  Administered 2020-12-02 – 2020-12-11 (×10): 40 mg via SUBCUTANEOUS
  Filled 2020-12-02 (×10): qty 0.4

## 2020-12-02 MED ORDER — MORPHINE SULFATE (PF) 2 MG/ML IV SOLN
2.0000 mg | INTRAVENOUS | Status: DC | PRN
Start: 2020-12-02 — End: 2020-12-12
  Administered 2020-12-05: 12:00:00 2 mg via INTRAVENOUS
  Filled 2020-12-02: qty 1

## 2020-12-02 NOTE — ED Notes (Signed)
Pt. O2 sat 89%, this RN titrated pt's O2 up to 4 L [place].

## 2020-12-02 NOTE — ED Provider Notes (Signed)
[facility]  ____________________________________________   Event Date/Time   First MD Initiated Contact with Patient 12/02/20 1611     (approximate)  I have reviewed the triage vital signs and the nursing notes.   HISTORY  Chief Complaint Shortness of Breath (Pt. To ED via EMS for SOB x2 days. Pt. Smokes 2ppd. Denies n/v/d, or fevers.)    HPI [name] is a 70 y.o. female with past medical history of COPD not on home oxygen, anxiety, hypothyroidism, hypertension who presents with shortness of breath.  History somewhat limited secondary to patient's respiratory distress and being on BiPAP.  She has had symptoms for about several days.  Worse over the last 2 days.  She has had cough that is intermittently productive of green sputum.  Denies hemoptysis.  She denies chest pain.  Denies nausea, vomiting or diarrhea.         Past Medical History:  Diagnosis Date   Anxiety    Arthritis    Chronic back pain    COPD (chronic obstructive pulmonary disease) (HCC)    Depression    GERD (gastroesophageal reflux disease)    Hypertension    Hypothyroidism    Pneumonia    RECENT X 3   PONV (postoperative nausea and vomiting)    Psoriasis    Thyroid disease     Patient Active Problem List   Diagnosis Date Noted   Acute hypoxemic respiratory failure ([place]) 12/02/2020   Stage 3a chronic kidney disease ([place]) 08/01/2020   Psoriasis 07/26/2020   Class 2 severe obesity due to excess calories with serious comorbidity and body mass index (BMI) of 37.0 to 37.9 in adult [facility]) 07/26/2020   Thoracic aortic atherosclerosis ([place]) 02/12/2017   Chronic hip pain 05/07/2016   Insomnia 01/10/2016   COPD (chronic obstructive pulmonary disease) with chronic bronchitis ([place]) 12/17/2015   Bilateral carotid artery stenosis 08/22/2015   Atherosclerotic peripheral vascular disease with intermittent claudication ([place]) 08/04/2015   Benign essential HTN 08/04/2015   Mixed  hyperlipidemia 08/04/2015   Panic disorder without agoraphobia with moderate panic attacks 07/01/2015   Prediabetes 06/09/2015   Clinical depression 08/19/2014   Adult hypothyroidism 08/19/2014   Anxiety disorder 06/23/2014   Degeneration of intervertebral disc of lumbosacral region 03/22/2008    Past Surgical History:  Procedure Laterality Date   ABDOMINAL HYSTERECTOMY     heavy bleeding   bladder stimulator     CARPAL TUNNEL RELEASE     CATARACT EXTRACTION W/PHACO Right 03/07/2015   Procedure: CATARACT EXTRACTION PHACO AND INTRAOCULAR LENS PLACEMENT ([place]);  Surgeon: Estill Cotta, MD;  Location: [facility] ORS;  Service: Ophthalmology;  Laterality: Right;  [name] 01:14 AP% 26.4 CDE 33.04 fluid pack lot # [id number]   CATARACT EXTRACTION W/PHACO Left 03/21/2015   Procedure: CATARACT EXTRACTION PHACO AND INTRAOCULAR LENS PLACEMENT (I[facility]);  Surgeon: Estill Cotta, MD;  Location: [facility] ORS;  Service: Ophthalmology;  Laterality: Left;  [name] 01:21 AP% 25.6 CDE 39.78 fluid pack lot # [id number]   HAND SURGERY Right    INTERSTIM IMPLANT REMOVAL N/A 09/07/2015   Procedure: REMOVAL OF INTERSTIM IMPLANT;  Surgeon: Bjorn Loser, MD;  Location: [facility] ORS;  Service: Urology;  Laterality: N/A;   OOPHORECTOMY Right    ovarian cyst    Prior to Admission medications   Medication Sig Start Date End Date Taking? Authorizing Provider  albuterol (VENTOLIN HFA) 108 (90 Base) MCG/ACT inhaler INHALE 2 PUFFS BY MOUTH INTO THE LUNGS EVERY 6 HOURS AS NEEDED FOR WHEEZING OR SHORTNESS  10/23/20  Yes Baity, Coralie Keens, NP  busPIRone (BUSPAR) 7.5 MG tablet TAKE ONE TABLET BY MOUTH THREE TIMES DAILY 11/15/20  Yes Jearld Fenton, NP  carvedilol (COREG) 3.125 MG tablet TAKE ONE TABLET BY MOUTH TWICE DAILY TAKE WITH A MEAL 09/22/20  Yes Karamalegos, Devonne Doughty, DO  citalopram (CELEXA) 40 MG tablet Take 1 tablet (40 mg total) by mouth daily. 08/24/20  Yes Bacigalupo, Dionne Bucy, MD  clonazePAM (KLONOPIN) 1 MG tablet TAKE ONE  TABLET BY MOUTH EVERY MORNING, ONE-HALF TABLET IN THE AFTERNOON & ONE TABLET AT BEDTIME 10/19/20  Yes Baity, Coralie Keens, NP  diltiazem (CARDIZEM CD) 120 MG 24 hr capsule TAKE ONE CAPSULE BY MOUTH ONCE DAILY 09/22/20  Yes Karamalegos, Devonne Doughty, DO  fluticasone furoate-vilanterol (BREO ELLIPTA) 200-25 MCG/INH AEPB Inhale 1 puff into the lungs daily. 08/02/20  Yes Baity, Coralie Keens, NP  Guselkumab (TREMFYA [facility]) Inject into the skin every 30 (thirty) days.   Yes [provider]  levothyroxine (SYNTHROID) 175 MCG tablet Take 1 tablet (175 mcg total) by mouth daily. 09/23/20  Yes Baity, Coralie Keens, NP  lisinopril (ZESTRIL) 10 MG tablet TAKE ONE TABLET BY MOUTH ONCE DAILY 10/23/20  Yes Jearld Fenton, NP  acetaminophen (TYLENOL) 500 MG tablet Take 500 mg by mouth every 6 (six) hours as needed for moderate pain.     [provider]  furosemide (LASIX) 20 MG tablet TAKE ONE TABLET BY MOUTH ONCE DAILY Patient not taking: No sig reported 10/23/20   Jearld Fenton, NP  hydrOXYzine (ATARAX/VISTARIL) 25 MG tablet TAKE ONE TABLET BY MOUTH AT BEDTIME Patient not taking: No sig reported 11/18/19   Malfi, Lupita Raider, [place]. Devices (QUAD CANE/SMALL BASE) MISC 1 Device by Does not apply route daily. 06/26/17   Mikey College, NP  pravastatin (PRAVACHOL) 10 MG tablet Take 1 tablet (10 mg total) by mouth daily. Patient not taking: No sig reported 08/04/20   Jearld Fenton, NP    Allergies Ciprofloxacin, Oxycodone, Oxycodone-acetaminophen, Oxycodone-acetaminophen, and Sulfa antibiotics  Family History  Problem Relation Age of Onset   Heart disease Mother    Breast cancer Mother    Heart attack Mother    COPD Father    Stroke Paternal Uncle    Kidney disease Sister    Breast cancer Cousin    Breast cancer Cousin    Heart attack Maternal Aunt    Stroke Paternal Grandfather    Kidney cancer Neg Hx    Bladder Cancer Neg Hx     Social History Social History   Tobacco Use   Smoking  status: Every Day    Packs/day: 1.00    Years: 35.00    Pack years: 35.00    Types: Cigarettes    Start date: 04/19/2018   Smokeless tobacco: Never   Tobacco comments:    Previously smoked 2 ppd.   Vaping Use   Vaping Use: Never used  Substance Use Topics   Alcohol use: No   Drug use: No    Review of Systems   Review of Systems  Constitutional:  Negative for appetite change, chills and fever.  Respiratory:  Positive for cough and shortness of breath. Negative for chest tightness.   Cardiovascular:  Negative for chest pain.  Gastrointestinal:  Negative for diarrhea, nausea and vomiting.  Genitourinary:  Negative for dysuria.  All other systems reviewed and are negative.  Physical Exam Updated Vital Signs BP (!) 101/43   Pulse (!) 57  Resp 11   Ht 5\' 3"  (1.6 m)   Wt 91 kg   SpO2 99%   BMI 35.54 kg/m   Physical Exam Vitals and nursing note reviewed.  Constitutional:      General: She is not in acute distress.    Appearance: Normal appearance.  HENT:     Head: Normocephalic and atraumatic.  Eyes:     General: No scleral icterus.    Conjunctiva/sclera: Conjunctivae normal.  Cardiovascular:     Rate and Rhythm: Normal rate and regular rhythm.  Pulmonary:     Effort: Respiratory distress present.     Breath sounds: No stridor.     Comments: Pt w/ frequent cough, increased WOB, breath sounds diminished  Abdominal:     Palpations: Abdomen is soft. There is no mass.     Tenderness: There is no guarding.  Musculoskeletal:        General: No deformity or signs of injury.     Cervical back: Normal range of motion.  Skin:    General: Skin is dry.     Coloration: Skin is not jaundiced or pale.     Comments: Erythematous intertrigo under the breasts  Neurological:     General: No focal deficit present.     Mental Status: She is alert and oriented to person, place, and time. Mental status is at baseline.  Psychiatric:        Mood and Affect: Mood normal.         Behavior: Behavior normal.     LABS (all labs ordered are listed, but only abnormal results are displayed)  Labs Reviewed  CBC WITH DIFFERENTIAL/PLATELET - Abnormal; Notable for the following components:      Result Value   RBC 5.49 (*)    Hemoglobin 15.7 (*)    HCT 52.4 (*)    All other components within normal limits  BRAIN NATRIURETIC PEPTIDE - Abnormal; Notable for the following components:   B Natriuretic Peptide 285.7 (*)    All other components within normal limits  COMPREHENSIVE METABOLIC PANEL - Abnormal; Notable for the following components:   Chloride 92 (*)    CO2 41 (*)    Glucose, Bld 163 (*)    Calcium 8.8 (*)    Albumin 3.3 (*)    AST 14 (*)    All other components within normal limits  BLOOD GAS, VENOUS - Abnormal; Notable for the following components:   pCO2, Ven 80 (*)    pO2, Ven <31.0 (*)    Bicarbonate 38.5 (*)    Acid-Base Excess 8.3 (*)    All other components within normal limits  RESP PANEL BY RT-PCR (FLU A&B, COVID) ARPGX2  RESPIRATORY PANEL BY PCR  BASIC METABOLIC PANEL  CBC  TROPONIN I (HIGH SENSITIVITY)   ____________________________________________  EKG  NSR, nml axis, nml intervals, no acute ischemic changes NSR, nml axis, nml intervals, no acute ischemic changes  ____________________________________________  RADIOLOGY I, Madelin Headings, personally viewed and evaluated these images (plain radiographs) as part of my medical decision making, as well as reviewing the written report by the radiologist.  ED MD interpretation:  I reviewed the CXR which does not show any acute cardiopulmonary process      ____________________________________________   PROCEDURES  Procedure(s) performed (including Critical Care):  .Critical Care Performed by: Rada Hay, MD Authorized by: Rada Hay, MD   Critical care provider statement:    Critical care time (minutes):  30   Critical care  was necessary to treat or prevent  imminent or life-threatening deterioration of the following conditions:  Respiratory failure   Critical care was time spent personally by me on the following activities:  Development of treatment plan with patient or surrogate, discussions with consultants, evaluation of patient's response to treatment, examination of patient, ordering and performing treatments and interventions, ordering and review of laboratory studies, ordering and review of radiographic studies, pulse oximetry, re-evaluation of patient's condition and review of old charts   I assumed direction of critical care for this patient from another provider in my specialty: no     Care discussed with: admitting provider     ____________________________________________   INITIAL IMPRESSION / [place] / ED COURSE     Patient is a 70 year old female with a history of COPD not on home oxygen who presents with respiratory distress.  Per EMS her initial end-tidal CO2 was in the 80s and her room air saturation was in the 70s.  She was placed on BiPAP with improvement in her work of breathing.  Was given Solu-Medrol and duo nebs.  On arrival to the ED patient's work-up breathing seems to have improved, she is using accessory muscles but is able to speak in full sentences.  Breath sounds sounds diminished throughout.  She does have some slight lower extremity edema but does not appear grossly volume overloaded.  Her chest x-ray does not show any acute change.  It is negative.  BNP slightly elevated to 80.  Her bicarb is 41 consistent with chronic respiratory failure.  Venous blood gas was initially ordered but not sent and given her clinical improvement do not feel that it is necessary.  She was given albuterol 15 mg in the ED.  She was able to be taken off BiPAP and placed on 4 L nasal cannula with sats around 94%.  On repeat lung exam she does have significant wheezing.  Will require admission for COPD exacerbation.  We will treat with  azithro given her purulent sputum production.      ____________________________________________   FINAL CLINICAL IMPRESSION(S) / ED DIAGNOSES  Final diagnoses:  Acute respiratory failure with hypoxia [facility])     ED Discharge Orders     None        Note:  This document was prepared using Dragon voice recognition software and may include unintentional dictation errors.    Rada Hay, MD 12/02/20 1949

## 2020-12-02 NOTE — H&P (Signed)
[facility] History and Physical  [name] LEX:[id number] DOB: 17-Mar-1950 DOA: 12/02/2020  Referring physician: Dr. Starleen Blue PCP: Jearld Fenton, NP   Chief Complaint: shortness of breath  HPI: [name] is a 70 y.o. female with hx of COPD not on home oxygen, hypertension, hyperlipidemia, PAD, hypothyroidism, anxiety with panic disorder, morbid obesity, who presents with shortness of breath.  On my history patient reports she has not felt well for about the past week.  She has become increasingly short of breath and started to have increased sputum production above her normal baseline.  In addition to sputum has become much thicker and different from her usual sputum.  She denies missing any medications recently, denies any sick contacts, denies any travel.  She denies chest pain.  She endorses continuing to smoke but says she is committed to quitting.  Patient arrived to the ED via EMS, per their report her O2 saturations were 70% upon their arrival. In the ED per ED provider signout patient was initially saturating in the 80s and struggling to breathe and was placed on BiPAP.  A VBG that was obtained showed a pH of 7.29, PCO2 of 80 and a bicarbonate of 38.  She subsequently improved greatly and was able to be weaned to 4 L nasal cannula.  Subsequent lab work-up showed CMP notable for CO2 of 41, glucose 163, otherwise unremarkable.  CBC was also unremarkable.  Troponin was normal, BNP was elevated at 285.  COVID and influenza PCR was negative, portable chest x-ray showed no infiltrates.  EKG was unremarkable.  She is admitted for further management.  Review of Systems:  Pertinent positives and negative per HPI, all others reviewed and negative  Past Medical History:  Diagnosis Date   Anxiety    Arthritis    Chronic back pain    COPD (chronic obstructive pulmonary disease) (HCC)    Depression    GERD (gastroesophageal reflux disease)    Hypertension    Hypothyroidism     Pneumonia    RECENT X 3   PONV (postoperative nausea and vomiting)    Psoriasis    Thyroid disease    Past Surgical History:  Procedure Laterality Date   ABDOMINAL HYSTERECTOMY     heavy bleeding   bladder stimulator     CARPAL TUNNEL RELEASE     CATARACT EXTRACTION W/PHACO Right 03/07/2015   Procedure: CATARACT EXTRACTION PHACO AND INTRAOCULAR LENS PLACEMENT ([place]);  Surgeon: Estill Cotta, MD;  Location: [facility] ORS;  Service: Ophthalmology;  Laterality: Right;  [name] 01:14 AP% 26.4 CDE 33.04 fluid pack lot # [id number]   CATARACT EXTRACTION W/PHACO Left 03/21/2015   Procedure: CATARACT EXTRACTION PHACO AND INTRAOCULAR LENS PLACEMENT ([facility]);  Surgeon: Estill Cotta, MD;  Location: [facility] ORS;  Service: Ophthalmology;  Laterality: Left;  [name] 01:21 AP% 25.6 CDE 39.78 fluid pack lot # [id number]   HAND SURGERY Right    INTERSTIM IMPLANT REMOVAL N/A 09/07/2015   Procedure: REMOVAL OF INTERSTIM IMPLANT;  Surgeon: Bjorn Loser, MD;  Location: [facility] ORS;  Service: Urology;  Laterality: N/A;   OOPHORECTOMY Right    ovarian cyst   Social History:  reports that she has been smoking cigarettes. She started smoking about 2 years ago. She has a 35.00 pack-year smoking history. She has never used smokeless tobacco. She reports that she does not drink alcohol and does not use drugs.  Allergies  Allergen Reactions   Ciprofloxacin Hives and Diarrhea   Oxycodone Diarrhea   Oxycodone-Acetaminophen Diarrhea  Oxycodone-Acetaminophen Diarrhea   Sulfa Antibiotics Rash    Throat, mouth Throat, mouth    Family History  Problem Relation Age of Onset   Heart disease Mother    Breast cancer Mother    Heart attack Mother    COPD Father    Stroke Paternal Uncle    Kidney disease Sister    Breast cancer Cousin    Breast cancer Cousin    Heart attack Maternal Aunt    Stroke Paternal Grandfather    Kidney cancer Neg Hx    Bladder Cancer Neg Hx      Prior to Admission medications   Medication Sig  Start Date End Date Taking? Authorizing Provider  acetaminophen (TYLENOL) 500 MG tablet Take 500 mg by mouth every 6 (six) hours as needed for moderate pain.     [provider]  albuterol (VENTOLIN HFA) 108 (90 Base) MCG/ACT inhaler INHALE 2 PUFFS BY MOUTH INTO THE LUNGS EVERY 6 HOURS AS NEEDED FOR WHEEZING OR SHORTNESS 10/23/20   Baity, Coralie Keens, NP  busPIRone (BUSPAR) 7.5 MG tablet TAKE ONE TABLET BY MOUTH THREE TIMES DAILY 11/15/20   Jearld Fenton, NP  carvedilol (COREG) 3.125 MG tablet TAKE ONE TABLET BY MOUTH TWICE DAILY TAKE WITH A MEAL 09/22/20   Karamalegos, Devonne Doughty, DO  citalopram (CELEXA) 40 MG tablet Take 1 tablet (40 mg total) by mouth daily. 08/24/20   Bacigalupo, Dionne Bucy, MD  clonazePAM (KLONOPIN) 1 MG tablet TAKE ONE TABLET BY MOUTH EVERY MORNING, ONE-HALF TABLET IN THE AFTERNOON & ONE TABLET AT BEDTIME 10/19/20   Jearld Fenton, NP  diltiazem (CARDIZEM CD) 120 MG 24 hr capsule TAKE ONE CAPSULE BY MOUTH ONCE DAILY 09/22/20   Par[place] Ranger, Devonne Doughty, DO  fluticasone furoate-vilanterol (BREO ELLIPTA) 200-25 MCG/INH AEPB Inhale 1 puff into the lungs daily. 08/02/20   Jearld Fenton, NP  furosemide (LASIX) 20 MG tablet TAKE ONE TABLET BY MOUTH ONCE DAILY 10/23/20   Jearld Fenton, NP  Guselkumab [facility]) Inject into the skin every 30 (thirty) days.    [provider]  hydrOXYzine (ATARAX/VISTARIL) 25 MG tablet TAKE ONE TABLET BY MOUTH AT BEDTIME 11/18/19   Malfi, Lupita Raider, FNP  levothyroxine (SYNTHROID) 175 MCG tablet Take 1 tablet (175 mcg total) by mouth daily. 09/23/20   Jearld Fenton, NP  lisinopril (ZESTRIL) 10 MG tablet TAKE ONE TABLET BY MOUTH ONCE DAILY 10/23/20   Jearld Fenton, NP  Misc. Devices (QUAD CANE/SMALL BASE) MISC 1 Device by Does not apply route daily. 06/26/17   Mikey College, NP  pravastatin (PRAVACHOL) 10 MG tablet Take 1 tablet (10 mg total) by mouth daily. 08/04/20   Jearld Fenton, NP   Physical Exam: Vitals:   12/02/20 1612  12/02/20 1630 12/02/20 1645  BP:  (!) 101/43   Pulse:  (!) 59 (!) 57  Resp:  11 11  SpO2:  98% 99%  Weight: 91 kg    Height: 5\' 3"  (1.6 m)      Wt Readings from Last 3 Encounters:  12/02/20 91 kg  07/25/20 95.5 kg  04/12/20 93 kg     General:  Appears calm, mildly ill.  Obese. Eyes: PERRL, normal lids, irises & conjunctiva ENT: grossly normal hearing, lips & tongue Neck: no masses Cardiovascular: RRR, no m/r/g. No LE edema. Telemetry: SR, no arrhythmias  Respiratory: Mildly increased work of breathing.  Poor air movement throughout.  Very faint inspiratory wheezes, diffuse expiratory wheezes and rhonchi. Abdomen:  soft, ntnd Skin: no rash or induration seen on limited exam Musculoskeletal: grossly normal tone BUE/BLE Psychiatric: grossly normal mood and affect, speech fluent and appropriate Neurologic: grossly non-focal.          Labs on Admission:  Basic Metabolic Panel: Recent Labs  Lab 12/02/20 1612  NA 141  K 3.5  CL 92*  CO2 41*  GLUCOSE 163*  BUN 14  CREATININE 0.97  CALCIUM 8.8*   Liver Function Tests: Recent Labs  Lab 12/02/20 1612  AST 14*  ALT 9  ALKPHOS 90  BILITOT 0.8  PROT 6.7  ALBUMIN 3.3*   No results for input(s): LIPASE, AMYLASE in the last 168 hours. No results for input(s): AMMONIA in the last 168 hours. CBC: Recent Labs  Lab 12/02/20 1612  WBC 8.6  NEUTROABS 5.3  HGB 15.7*  HCT 52.4*  MCV 95.4  PLT 225   Cardiac Enzymes: No results for input(s): CKTOTAL, CKMB, CKMBINDEX, TROPONINI in the last 168 hours.  BNP (last 3 results) Recent Labs    07/28/20 1004 12/02/20 1612  BNP 45 285.7*    ProBNP (last 3 results) No results for input(s): PROBNP in the last 8760 hours.  CBG: No results for input(s): GLUCAP in the last 168 hours.  Radiological Exams on Admission: DG Chest Port 1 View  Result Date: 12/02/2020 CLINICAL DATA:  SOB x 2 days, history of COPD, hypertension, smoker EXAM: PORTABLE CHEST 1 VIEW COMPARISON:   12/02/17 FINDINGS: The heart and mediastinal contours are unchanged. Aortic calcifications. No focal consolidation. No pulmonary edema. No pleural effusion. No pneumothorax. No acute osseous abnormality. IMPRESSION: No active disease. Electronically Signed   By: Iven Finn M.D.   On: 12/02/2020 16:52    EKG: Independently reviewed.  Sinus rhythm, normal axis, no ischemic changes, overall unremarkable EKG.  Compared to prior there is no significant change.  Assessment/Plan Active Problems:   Anxiety disorder   Adult hypothyroidism   Panic disorder without agoraphobia with moderate panic attacks   Atherosclerotic peripheral vascular disease with intermittent claudication (HCC)   Benign essential HTN   COPD (chronic obstructive pulmonary disease) with chronic bronchitis (HCC)   Psoriasis   Class 2 severe obesity due to excess calories with serious comorbidity and body mass index (BMI) of 37.0 to 37.9 in adult [facility])   Acute hypoxemic respiratory failure (HCC)   [name] is a 70 y.o. female with hx of COPD not on home oxygen, hypertension, hyperlipidemia, PAD, hypothyroidism, anxiety with panic disorder, morbid obesity, who presents with shortness of breath and found to be in acute COPD exacerbation with hypoxemic respiratory failure.  #Acute hypoxemic respiratory failure #COPD exacerbation #Tobacco use disorder Patient presenting with poor air movement, diffuse wheezing, and hypoxemia consistent with COPD exacerbation.  No clear precipitating factor beyond ongoing tobacco use.  Low suspicion for cardiac etiology. - Methylprednisolone 125 mg every 12 for 2 doses followed by p.o. prednisone 40 mg daily for total of 5 days - Ceftriaxone 1 g IV every 24 hours x5 days - DuoNebs scheduled every 6 hours - Continue home Breo Ellipta - Obtain respiratory PCR  #Chronic medical problems Anxiety-continue home BuSpar, Klonopin, citalopram  Hypertension-continue home carvedilol, diltiazem,  lisinopril  Hypothyroidism-continue Synthroid   Code Status: DNR/DNI, confirmed with patient DVT Prophylaxis: Lovenox Family Communication: Son updated at bedside Disposition Plan: Inpatient, MedSurg  Time spent: 68 min  Clarnce Flock MD/MPH [facility]  Note:  This document was prepared using Systems analyst and may  include unintentional dictation errors.

## 2020-12-02 NOTE — ED Triage Notes (Signed)
Pt. To ED via EMS for SOB x2 days. Pt. Smokes 2ppd. Denies n/v/d, or fevers. Medic reports pt was 70% on RA on scene.

## 2020-12-02 NOTE — ED Notes (Signed)
Dinner tray to bedside, pt. Repositioned to eat, sitting up.

## 2020-12-02 NOTE — ED Notes (Signed)
Critical Blood Gas result Venous blood gas, pCO280                         pO2, venous   <31.0  Dr. Starleen Blue notified.

## 2020-12-03 ENCOUNTER — Encounter: Payer: Self-pay | Admitting: Family Medicine

## 2020-12-03 DIAGNOSIS — J449 Chronic obstructive pulmonary disease, unspecified: Secondary | ICD-10-CM

## 2020-12-03 LAB — BASIC METABOLIC PANEL
Anion gap: 7 (ref 5–15)
BUN: 14 mg/dL (ref 8–23)
CO2: 38 mmol/L — ABNORMAL HIGH (ref 22–32)
Calcium: 8.8 mg/dL — ABNORMAL LOW (ref 8.9–10.3)
Chloride: 93 mmol/L — ABNORMAL LOW (ref 98–111)
Creatinine, Ser: 0.79 mg/dL (ref 0.44–1.00)
GFR, Estimated: >60 mL/min (ref >60)
Glucose, Bld: 171 mg/dL — ABNORMAL HIGH (ref 70–99)
Potassium: 3.6 mmol/L (ref 3.5–5.1)
Sodium: 138 mmol/L (ref 135–145)

## 2020-12-03 LAB — RESPIRATORY PANEL BY PCR

## 2020-12-03 LAB — CBC
HCT: 46.9 % — ABNORMAL HIGH (ref 36.0–46.0)
Hemoglobin: 14.6 g/dL (ref 12.0–15.0)
MCH: 29.1 pg (ref 26.0–34.0)
MCHC: 31.1 g/dL (ref 30.0–36.0)
MCV: 93.4 fL (ref 80.0–100.0)
Platelets: 210 10*3/uL (ref 150–400)
RBC: 5.02 MIL/uL (ref 3.87–5.11)
RDW: 14.3 % (ref 11.5–15.5)
WBC: 7.4 10*3/uL (ref 4.0–10.5)
nRBC: 0 % (ref 0.0–0.2)

## 2020-12-03 MED ORDER — NYSTATIN 100000 UNIT/GM EX POWD
Freq: Two times a day (BID) | CUTANEOUS | Status: AC
  Filled 2020-12-03 (×2): qty 15

## 2020-12-03 NOTE — Progress Notes (Signed)
Progress Note    [name]  PNT:[id number] DOB: Sep 05, 1950  DOA: 12/02/2020 PCP: Jearld Fenton, NP      Brief Narrative:    Medical records reviewed and are as summarized below:  [name] is a 70 y.o. female with medical history significant for morbid obesity, COPD, hypertension, hyperlipidemia, PAD, hypothyroidism, anxiety with panic disorder, who presented to the hospital with shortness of breath, productive cough with increased sputum production and wheezing.  Reportedly, her oxygen saturation was in the 70s.  VBG showed pH 7.29, PCO2 80 and PO2 was less than 31.  She was admitted to the hospital for acute exacerbation of COPD and acute hypoxemic and hypercapnic respiratory failure.    Assessment/Plan:   Active Problems:   Anxiety disorder   Adult hypothyroidism   Panic disorder without agoraphobia with moderate panic attacks   Atherosclerotic peripheral vascular disease with intermittent claudication (HCC)   Benign essential HTN   COPD (chronic obstructive pulmonary disease) with chronic bronchitis (HCC)   Psoriasis   Class 2 severe obesity due to excess calories with serious comorbidity and body mass index (BMI) of 37.0 to 37.9 in adult [facility])   Acute hypoxemic respiratory failure (HCC)   Body mass index is 35.54 kg/m.   COPD exacerbation: Continue steroids, bronchodilators and antibiotics.  Acute hypoxemic and hypercapnic respiratory failure, suspect underlying chronic hypercapnic respiratory failure: Continue 4 L/min oxygen via nasal cannula and taper off as able.  Tobacco use disorder: Counseled to quit smoking cigarettes.  Other comorbidities include PVD, anxiety, hypertension, hypothyroidism  Diet Order             Diet heart healthy/carb modified Room service appropriate? Yes; Fluid consistency: Thin  Diet effective now                      Consultants: None  Procedures: None    Medications:    busPIRone  7.5 mg Oral TID    carvedilol  3.125 mg Oral BID WC   citalopram  40 mg Oral Daily   diltiazem  120 mg Oral Daily   enoxaparin (LOVENOX) injection  40 mg Subcutaneous Q24H   fluticasone furoate-vilanterol  1 puff Inhalation Daily   ipratropium-albuterol  3 mL Nebulization Q6H   levothyroxine  175 mcg Oral Daily   lisinopril  10 mg Oral Daily   [START ON 12/04/2020] predniSONE  40 mg Oral Q breakfast   sodium chloride flush  3 mL Intravenous Q12H   Continuous Infusions:  cefTRIAXone (ROCEPHIN)  IV Stopped (12/02/20 2024)     Anti-infectives (From admission, onward)    Start     Dose/Rate Route Frequency Ordered Stop   12/02/20 1845  cefTRIAXone (ROCEPHIN) 1 g in sodium chloride 0.9 % 100 mL IVPB        1 g 200 mL/hr over 30 Minutes Intravenous Every 24 hours 12/02/20 1838 12/07/20 1844   12/02/20 1730  azithromycin (ZITHROMAX) tablet 500 mg        500 mg Oral  Once 12/02/20 1726 12/02/20 1956              Family Communication/Anticipated D/C date and plan/Code Status   DVT prophylaxis: enoxaparin (LOVENOX) injection 40 mg Start: 12/02/20 2200     Code Status: DNR  Family Communication: None Disposition Plan: Plan to discharge home in 2 to 3 days   Status is: Inpatient  Remains inpatient appropriate because: Shortness of breath, hypoxia  Subjective:   C/o productive cough, shortness of breath and wheezing  Objective:    Vitals:   12/03/20 0252 12/03/20 0628 12/03/20 0700 12/03/20 0720  BP:  (!) 145/76 114/65 114/65  Pulse:  76 72 72  Resp:  18 20 20   Temp:  97.8 F (36.6 C) 98.2 F (36.8 C) 98.2 F (36.8 C)  TempSrc:  Oral Oral Oral  SpO2: 92% 95% 97%   Weight:      Height:       No data found.   Intake/Output Summary (Last 24 hours) at 12/03/2020 1022 Last data filed at 12/02/2020 2357 Gross per 24 hour  Intake 110 ml  Output --  Net 110 ml   Filed Weights   12/02/20 1612  Weight: 91 kg    Exam:  GEN: NAD SKIN: Psoriatic rash on  lower extremities and back, erythema under breasts EYES: No pallor or icterus ENT: MMM CV: RRR PULM: Decreased air entry bilaterally, bilateral expiratory wheezing, no rales heard  ABD: soft, obese, NT, +BS CNS: AAO x 3, non focal EXT: No edema or tenderness        Data Reviewed:   I have personally reviewed following labs and imaging studies:  Labs: Labs show the following:   Basic Metabolic Panel: Recent Labs  Lab 12/02/20 1612 12/03/20 0607  NA 141 138  K 3.5 3.6  CL 92* 93*  CO2 41* 38*  GLUCOSE 163* 171*  BUN 14 14  CREATININE 0.97 0.79  CALCIUM 8.8* 8.8*   GFR Estimated Creatinine Clearance: 69 mL/min (by C-G formula based on SCr of 0.79 mg/dL). Liver Function Tests: Recent Labs  Lab 12/02/20 1612  AST 14*  ALT 9  ALKPHOS 90  BILITOT 0.8  PROT 6.7  ALBUMIN 3.3*   No results for input(s): LIPASE, AMYLASE in the last 168 hours. No results for input(s): AMMONIA in the last 168 hours. Coagulation profile No results for input(s): INR, PROTIME in the last 168 hours.  CBC: Recent Labs  Lab 12/02/20 1612 12/03/20 0607  WBC 8.6 7.4  NEUTROABS 5.3  --   HGB 15.7* 14.6  HCT 52.4* 46.9*  MCV 95.4 93.4  PLT 225 210   Cardiac Enzymes: No results for input(s): CKTOTAL, CKMB, CKMBINDEX, TROPONINI in the last 168 hours. BNP (last 3 results) No results for input(s): PROBNP in the last 8760 hours. CBG: No results for input(s): GLUCAP in the last 168 hours. D-Dimer: No results for input(s): DDIMER in the last 72 hours. Hgb A1c: No results for input(s): HGBA1C in the last 72 hours. Lipid Profile: No results for input(s): CHOL, HDL, LDLCALC, TRIG, CHOLHDL, LDLDIRECT in the last 72 hours. Thyroid function studies: No results for input(s): TSH, T4TOTAL, T3FREE, THYROIDAB in the last 72 hours.  Invalid input(s): FREET3 Anemia work up: No results for input(s): VITAMINB12, FOLATE, FERRITIN, TIBC, IRON, RETICCTPCT in the last 72 hours. Sepsis Labs: Recent  Labs  Lab 12/02/20 1612 12/03/20 0607  WBC 8.6 7.4    Microbiology Recent Results (from the past 240 hour(s))  Resp Panel by RT-PCR (Flu A&B, Covid) Nasopharyngeal Swab     Status: None   Collection Time: 12/02/20  4:14 PM   Specimen: Nasopharyngeal Swab; Nasopharyngeal(NP) swabs in vial transport medium  Result Value Ref Range Status   SARS Coronavirus 2 by RT PCR NEGATIVE NEGATIVE Final    Comment: (NOTE) SARS-CoV-2 target nucleic acids are NOT DETECTED.  The SARS-CoV-2 RNA is generally detectable in upper respiratory specimens during the acute  phase of infection. The lowest concentration of SARS-CoV-2 viral copies this assay can detect is 138 copies/mL. A negative result does not preclude SARS-Cov-2 infection and should not be used as the sole basis for treatment or other patient management decisions. A negative result may occur with  improper specimen collection/handling, submission of specimen other than nasopharyngeal swab, presence of viral mutation(s) within the areas targeted by this assay, and inadequate number of viral copies(<138 copies/mL). A negative result must be combined with clinical observations, patient history, and epidemiological information. The expected result is Negative.  Fact Sheet for Patients:  EntrepreneurPulse.com.au  Fact Sheet for Healthcare Providers:  IncredibleEmployment.be  This test is no t yet approved or cleared by the [place] FDA and  has been authorized for detection and/or diagnosis of SARS-CoV-2 by FDA under an Emergency Use Authorization (EUA). This EUA will remain  in effect (meaning this test can be used) for the duration of the COVID-19 declaration under Section 564(b)(1) of the Act, 21 U.S.C.section 360bbb-3(b)(1), unless the authorization is terminated  or revoked sooner.       Influenza A by PCR NEGATIVE NEGATIVE Final   Influenza B by PCR NEGATIVE NEGATIVE Final    Comment:  (NOTE) The Xpert Xpress SARS-CoV-2/FLU/RSV plus assay is intended as an aid in the diagnosis of influenza from Nasopharyngeal swab specimens and should not be used as a sole basis for treatment. Nasal washings and aspirates are unacceptable for Xpert Xpress SARS-CoV-2/FLU/RSV testing.  Fact Sheet for Patients: EntrepreneurPulse.com.au  Fact Sheet for Healthcare Providers: IncredibleEmployment.be  This test is not yet approved or cleared by the [place] FDA and has been authorized for detection and/or diagnosis of SARS-CoV-2 by FDA under an Emergency Use Authorization (EUA). This EUA will remain in effect (meaning this test can be used) for the duration of the COVID-19 declaration under Section 564(b)(1) of the Act, 21 U.S.C. section 360bbb-3(b)(1), unless the authorization is terminated or revoked.  Performed at [facility], [address] [place], [place] [address]   Respiratory (~20 pathogens) panel by PCR     Status: None   Collection Time: 12/02/20  9:22 PM   Specimen: Nasopharyngeal Swab; Respiratory  Result Value Ref Range Status   Adenovirus NOT DETECTED NOT DETECTED Final   Coronavirus 229E NOT DETECTED NOT DETECTED Final    Comment: (NOTE) The Coronavirus on the Respiratory Panel, DOES NOT test for the novel  Coronavirus (2019 nCoV)    Coronavirus HKU1 NOT DETECTED NOT DETECTED Final   Coronavirus NL63 NOT DETECTED NOT DETECTED Final   Coronavirus OC43 NOT DETECTED NOT DETECTED Final   Metapneumovirus NOT DETECTED NOT DETECTED Final   Rhinovirus / Enterovirus NOT DETECTED NOT DETECTED Final   Influenza A NOT DETECTED NOT DETECTED Final   Influenza B NOT DETECTED NOT DETECTED Final   Parainfluenza Virus 1 NOT DETECTED NOT DETECTED Final   Parainfluenza Virus 2 NOT DETECTED NOT DETECTED Final   Parainfluenza Virus 3 NOT DETECTED NOT DETECTED Final   Parainfluenza Virus 4 NOT DETECTED NOT DETECTED Final    Respiratory Syncytial Virus NOT DETECTED NOT DETECTED Final   Bordetella pertussis NOT DETECTED NOT DETECTED Final   Bordetella Parapertussis NOT DETECTED NOT DETECTED Final   Chlamydophila pneumoniae NOT DETECTED NOT DETECTED Final   Mycoplasma pneumoniae NOT DETECTED NOT DETECTED Final    Comment: Performed at [facility], [place]. [address] [place], [place] [address]    Procedures and diagnostic studies:  DG Chest [facility] [address]  Result Date: 12/02/2020 CLINICAL DATA:  SOB x 2 days, history of COPD, hypertension, smoker EXAM: PORTABLE CHEST 1 VIEW COMPARISON:  12/02/17 FINDINGS: The heart and mediastinal contours are unchanged. Aortic calcifications. No focal consolidation. No pulmonary edema. No pleural effusion. No pneumothorax. No acute osseous abnormality. IMPRESSION: No active disease. Electronically Signed   By: Iven Finn M.D.   On: 12/02/2020 16:52               LOS: 1 day   Korbyn Chopin  [facility]   Pager on [email or web address]. If 7PM-7AM, please contact night-coverage at [email or web address]     12/03/2020, 10:22 AM

## 2020-12-03 NOTE — Progress Notes (Signed)
   12/03/20 1112  Vitals  Temp 98.9 F (37.2 C)  Temp Source Oral  BP 117/60  MAP (mmHg) 78  BP Location Right Arm  BP Method Automatic  Patient Position (if appropriate) Lying  Pulse Rate 73  Pulse Rate Source Monitor  Resp 19   Per MD okay to give scheduled lisinopril and diltiazem.

## 2020-12-03 NOTE — ED Notes (Signed)
Request made for transport to the floor ?

## 2020-12-04 MED ORDER — TRAZODONE HCL 50 MG PO TABS
25.0000 mg | ORAL_TABLET | Freq: Every evening | ORAL | Status: DC | PRN
  Administered 2020-12-04 – 2020-12-08 (×4): 25 mg via ORAL
  Filled 2020-12-04 (×4): qty 1

## 2020-12-04 MED ORDER — GUAIFENESIN-DM 100-10 MG/5ML PO SYRP
5.0000 mL | ORAL_SOLUTION | ORAL | Status: DC | PRN
  Administered 2020-12-04 – 2020-12-08 (×8): 5 mL via ORAL
  Filled 2020-12-04 (×9): qty 5

## 2020-12-04 MED ORDER — IPRATROPIUM-ALBUTEROL 0.5-2.5 (3) MG/3ML IN SOLN
3.0000 mL | Freq: Three times a day (TID) | RESPIRATORY_TRACT | Status: DC
  Administered 2020-12-04 – 2020-12-06 (×6): 3 mL via RESPIRATORY_TRACT
  Filled 2020-12-04 (×7): qty 3

## 2020-12-04 MED ORDER — BENZONATATE 100 MG PO CAPS
100.0000 mg | ORAL_CAPSULE | Freq: Three times a day (TID) | ORAL | Status: DC | PRN
  Administered 2020-12-04 – 2020-12-09 (×6): 100 mg via ORAL
  Filled 2020-12-04 (×6): qty 1

## 2020-12-04 MED ORDER — IPRATROPIUM-ALBUTEROL 0.5-2.5 (3) MG/3ML IN SOLN
3.0000 mL | Freq: Four times a day (QID) | RESPIRATORY_TRACT | Status: DC | PRN
  Administered 2020-12-04 – 2020-12-08 (×3): 3 mL via RESPIRATORY_TRACT
  Filled 2020-12-04 (×3): qty 3

## 2020-12-04 NOTE — Plan of Care (Signed)
Patient stated that she remember taking TRAZODONE approximately 3 years ago and she became very "loopy" and "out of her mind" on the medication and do not want to take it anymore.  Instructed pt that I would notify the doctor that she (patient) wants medication discontinued and this nurse will put trazodone on her allergy list so that medication will not be prescribed for her.    Currently-patient is alert, oriented x4 -no changes from baseline - no adverse reaction noted from taking trazodone. Bed Alarm in use, side rails x4 up per patient's request. Call bell closeby, door opened.

## 2020-12-04 NOTE — Progress Notes (Addendum)
Progress Note    VALEDA [name]  LKT:[id number] DOB: 03/21/1950  DOA: 12/02/2020 PCP: Jearld Fenton, NP      Brief Narrative:    Medical records reviewed and are as summarized below:  [name] is a 70 y.o. female with medical history significant for morbid obesity, COPD, hypertension, hyperlipidemia, PAD, hypothyroidism, anxiety with panic disorder, who presented to the hospital with shortness of breath, productive cough with increased sputum production and wheezing.  Reportedly, her oxygen saturation was in the 70s.  VBG showed pH 7.29, PCO2 80 and PO2 was less than 31.  She was admitted to the hospital for acute exacerbation of COPD and acute hypoxemic and hypercapnic respiratory failure.    Assessment/Plan:   Principal Problem:   COPD with acute exacerbation (HCC) Active Problems:   Anxiety disorder   Adult hypothyroidism   Panic disorder without agoraphobia with moderate panic attacks   Atherosclerotic peripheral vascular disease with intermittent claudication (HCC)   Benign essential HTN   Psoriasis   Class 2 severe obesity due to excess calories with serious comorbidity and body mass index (BMI) of 37.0 to 37.9 in adult [facility])   Acute hypoxemic respiratory failure (HCC)   Body mass index is 35.54 kg/m.   COPD exacerbation: Continue steroids, antibiotics and bronchodilators.    Acute hypoxemic and hypercapnic respiratory failure, suspect underlying chronic hypercapnic respiratory failure: She remains on 4 L/min oxygen via nasal cannula.  Taper off oxygen as able.    Tobacco use disorder: Counseled to quit smoking cigarettes.  Other comorbidities include PVD, anxiety, hypertension, hypothyroidism  Diet Order             Diet heart healthy/carb modified Room service appropriate? Yes; Fluid consistency: Thin  Diet effective now                      Consultants: None  Procedures: None    Medications:    busPIRone  7.5 mg Oral TID    carvedilol  3.125 mg Oral BID WC   citalopram  40 mg Oral Daily   diltiazem  120 mg Oral Daily   enoxaparin (LOVENOX) injection  40 mg Subcutaneous Q24H   fluticasone furoate-vilanterol  1 puff Inhalation Daily   ipratropium-albuterol  3 mL Nebulization TID   levothyroxine  175 mcg Oral Daily   lisinopril  10 mg Oral Daily   nystatin   Topical BID   predniSONE  40 mg Oral Q breakfast   sodium chloride flush  3 mL Intravenous Q12H   Continuous Infusions:  cefTRIAXone (ROCEPHIN)  IV Stopped (12/03/20 1948)     Anti-infectives (From admission, onward)    Start     Dose/Rate Route Frequency Ordered Stop   12/02/20 1845  cefTRIAXone (ROCEPHIN) 1 g in sodium chloride 0.9 % 100 mL IVPB        1 g 200 mL/hr over 30 Minutes Intravenous Every 24 hours 12/02/20 1838 12/07/20 1844   12/02/20 1730  azithromycin (ZITHROMAX) tablet 500 mg        500 mg Oral  Once 12/02/20 1726 12/02/20 1956              Family Communication/Anticipated D/C date and plan/Code Status   DVT prophylaxis: enoxaparin (LOVENOX) injection 40 mg Start: 12/02/20 2200     Code Status: DNR  Family Communication: None Disposition Plan: Plan to discharge home in 1 to 2 days   Status is: Inpatient  Remains inpatient appropriate  because: Shortness of breath, hypoxia           Subjective:   Interval events noted.  She still has trouble breathing.  She has a productive cough and she's still wheezing.  Objective:    Vitals:   12/03/20 2100 12/04/20 0036 12/04/20 0445 12/04/20 0901  BP:  (!) 152/86 138/66 127/64  Pulse:  84 78 80  Resp:  16 16 14   Temp:  97.8 F (36.6 C) (!) 97.5 F (36.4 C) 97.6 F (36.4 C)  TempSrc:  Oral Oral   SpO2: 93% 98% 92% 97%  Weight:      Height:       No data found.   Intake/Output Summary (Last 24 hours) at 12/04/2020 1120 Last data filed at 12/04/2020 1044 Gross per 24 hour  Intake 694 ml  Output 150 ml  Net 544 ml   Filed Weights   12/02/20 1612   Weight: 91 kg    Exam:  GEN: NAD SKIN: Psoriatic rash on the back and lower extremities.  Erythema under bilateral breasts (improving after application of nystatin powder). EYES: No pallor or icterus ENT: MMM CV: RRR PULM: Bilateral expiratory wheezing, decreased air entry bilaterally.  No rales heard ABD: soft, obese, NT, +BS CNS: AAO x 3, non focal EXT: No edema or tenderness       Data Reviewed:   I have personally reviewed following labs and imaging studies:  Labs: Labs show the following:   Basic Metabolic Panel: Recent Labs  Lab 12/02/20 1612 12/03/20 0607  NA 141 138  K 3.5 3.6  CL 92* 93*  CO2 41* 38*  GLUCOSE 163* 171*  BUN 14 14  CREATININE 0.97 0.79  CALCIUM 8.8* 8.8*   GFR Estimated Creatinine Clearance: 69 mL/min (by C-G formula based on SCr of 0.79 mg/dL). Liver Function Tests: Recent Labs  Lab 12/02/20 1612  AST 14*  ALT 9  ALKPHOS 90  BILITOT 0.8  PROT 6.7  ALBUMIN 3.3*   No results for input(s): LIPASE, AMYLASE in the last 168 hours. No results for input(s): AMMONIA in the last 168 hours. Coagulation profile No results for input(s): INR, PROTIME in the last 168 hours.  CBC: Recent Labs  Lab 12/02/20 1612 12/03/20 0607  WBC 8.6 7.4  NEUTROABS 5.3  --   HGB 15.7* 14.6  HCT 52.4* 46.9*  MCV 95.4 93.4  PLT 225 210   Cardiac Enzymes: No results for input(s): CKTOTAL, CKMB, CKMBINDEX, TROPONINI in the last 168 hours. BNP (last 3 results) No results for input(s): PROBNP in the last 8760 hours. CBG: No results for input(s): GLUCAP in the last 168 hours. D-Dimer: No results for input(s): DDIMER in the last 72 hours. Hgb A1c: No results for input(s): HGBA1C in the last 72 hours. Lipid Profile: No results for input(s): CHOL, HDL, LDLCALC, TRIG, CHOLHDL, LDLDIRECT in the last 72 hours. Thyroid function studies: No results for input(s): TSH, T4TOTAL, T3FREE, THYROIDAB in the last 72 hours.  Invalid input(s): FREET3 Anemia  work up: No results for input(s): VITAMINB12, FOLATE, FERRITIN, TIBC, IRON, RETICCTPCT in the last 72 hours. Sepsis Labs: Recent Labs  Lab 12/02/20 1612 12/03/20 0607  WBC 8.6 7.4    Microbiology Recent Results (from the past 240 hour(s))  Resp Panel by RT-PCR (Flu A&B, Covid) Nasopharyngeal Swab     Status: None   Collection Time: 12/02/20  4:14 PM   Specimen: Nasopharyngeal Swab; Nasopharyngeal(NP) swabs in vial transport medium  Result Value Ref Range Status  SARS Coronavirus 2 by RT PCR NEGATIVE NEGATIVE Final    Comment: (NOTE) SARS-CoV-2 target nucleic acids are NOT DETECTED.  The SARS-CoV-2 RNA is generally detectable in upper respiratory specimens during the acute phase of infection. The lowest concentration of SARS-CoV-2 viral copies this assay can detect is 138 copies/mL. A negative result does not preclude SARS-Cov-2 infection and should not be used as the sole basis for treatment or other patient management decisions. A negative result may occur with  improper specimen collection/handling, submission of specimen other than nasopharyngeal swab, presence of viral mutation(s) within the areas targeted by this assay, and inadequate number of viral copies(<138 copies/mL). A negative result must be combined with clinical observations, patient history, and epidemiological information. The expected result is Negative.  Fact Sheet for Patients:  EntrepreneurPulse.com.au  Fact Sheet for Healthcare Providers:  IncredibleEmployment.be  This test is no t yet approved or cleared by the [place] FDA and  has been authorized for detection and/or diagnosis of SARS-CoV-2 by FDA under an Emergency Use Authorization (EUA). This EUA will remain  in effect (meaning this test can be used) for the duration of the COVID-19 declaration under Section 564(b)(1) of the Act, 21 U.S.C.section 360bbb-3(b)(1), unless the authorization is terminated  or  revoked sooner.       Influenza A by PCR NEGATIVE NEGATIVE Final   Influenza B by PCR NEGATIVE NEGATIVE Final    Comment: (NOTE) The Xpert Xpress SARS-CoV-2/FLU/RSV plus assay is intended as an aid in the diagnosis of influenza from Nasopharyngeal swab specimens and should not be used as a sole basis for treatment. Nasal washings and aspirates are unacceptable for Xpert Xpress SARS-CoV-2/FLU/RSV testing.  Fact Sheet for Patients: EntrepreneurPulse.com.au  Fact Sheet for Healthcare Providers: IncredibleEmployment.be  This test is not yet approved or cleared by the [place] FDA and has been authorized for detection and/or diagnosis of SARS-CoV-2 by FDA under an Emergency Use Authorization (EUA). This EUA will remain in effect (meaning this test can be used) for the duration of the COVID-19 declaration under Section 564(b)(1) of the Act, 21 U.S.C. section 360bbb-3(b)(1), unless the authorization is terminated or revoked.  Performed at [facility], [address] [place], [place] [address]   Respiratory (~20 pathogens) panel by PCR     Status: None   Collection Time: 12/02/20  9:22 PM   Specimen: Nasopharyngeal Swab; Respiratory  Result Value Ref Range Status   Adenovirus NOT DETECTED NOT DETECTED Final   Coronavirus 229E NOT DETECTED NOT DETECTED Final    Comment: (NOTE) The Coronavirus on the Respiratory Panel, DOES NOT test for the novel  Coronavirus (2019 nCoV)    Coronavirus HKU1 NOT DETECTED NOT DETECTED Final   Coronavirus NL63 NOT DETECTED NOT DETECTED Final   Coronavirus OC43 NOT DETECTED NOT DETECTED Final   Metapneumovirus NOT DETECTED NOT DETECTED Final   Rhinovirus / Enterovirus NOT DETECTED NOT DETECTED Final   Influenza A NOT DETECTED NOT DETECTED Final   Influenza B NOT DETECTED NOT DETECTED Final   Parainfluenza Virus 1 NOT DETECTED NOT DETECTED Final   Parainfluenza Virus 2 NOT DETECTED NOT DETECTED  Final   Parainfluenza Virus 3 NOT DETECTED NOT DETECTED Final   Parainfluenza Virus 4 NOT DETECTED NOT DETECTED Final   Respiratory Syncytial Virus NOT DETECTED NOT DETECTED Final   Bordetella pertussis NOT DETECTED NOT DETECTED Final   Bordetella Parapertussis NOT DETECTED NOT DETECTED Final   Chlamydophila pneumoniae NOT DETECTED NOT DETECTED Final   Mycoplasma pneumoniae NOT  DETECTED NOT DETECTED Final    Comment: Performed at [facility], [place] [address], [place], [place] [address]    Procedures and diagnostic studies:  DG Chest Port 1 View  Result Date: 12/02/2020 CLINICAL DATA:  SOB x 2 days, history of COPD, hypertension, smoker EXAM: PORTABLE CHEST 1 VIEW COMPARISON:  12/02/17 FINDINGS: The heart and mediastinal contours are unchanged. Aortic calcifications. No focal consolidation. No pulmonary edema. No pleural effusion. No pneumothorax. No acute osseous abnormality. IMPRESSION: No active disease. Electronically Signed   By: Iven Finn M.D.   On: 12/02/2020 16:52               LOS: 2 days   Arrietty Dercole  [facility]   Pager on [email or web address]. If 7PM-7AM, please contact night-coverage at [email or web address]     12/04/2020, 11:20 AM

## 2020-12-05 NOTE — Progress Notes (Signed)
Patient o2 saturations on room air while ambulating was 86% , patient came up to 93% while ambulating on 3L of o2.

## 2020-12-05 NOTE — Plan of Care (Signed)

## 2020-12-05 NOTE — Progress Notes (Addendum)
Progress Note    CARYN GIENGER  KDX:[id number] DOB: [birth year]  DOA: 12/02/2020 PCP: Jearld Fenton, NP      Brief Narrative:    Medical records reviewed and are as summarized below:  [name] is a 70 y.o. female with medical history significant for morbid obesity, COPD, hypertension, hyperlipidemia, PAD, hypothyroidism, anxiety with panic disorder, who presented to the hospital with shortness of breath, productive cough with increased sputum production and wheezing.  Reportedly, her oxygen saturation was in the 70s.  VBG showed pH 7.29, PCO2 80 and PO2 was less than 31.  She was admitted to the hospital for acute exacerbation of COPD and acute hypoxemic and hypercapnic respiratory failure.    Assessment/Plan:   Principal Problem:   COPD with acute exacerbation (HCC) Active Problems:   Anxiety disorder   Adult hypothyroidism   Panic disorder without agoraphobia with moderate panic attacks   Atherosclerotic peripheral vascular disease with intermittent claudication (HCC)   Benign essential HTN   Psoriasis   Class 2 severe obesity due to excess calories with serious comorbidity and body mass index (BMI) of 37.0 to 37.9 in adult [facility])   Acute hypoxemic respiratory failure (HCC)   Body mass index is 35.54 kg/m.   COPD exacerbation: Continue IV Rocephin, steroids and bronchodilators.      Acute hypoxemic and hypercapnic respiratory failure, chronic hypoxemic and hypercapnic respiratory failure: Oxygen saturation dropped to 86 while ambulating on room air was 86% and it came up to 93% on 3 L/min oxygen via nasal cannula.  She will likely need home oxygen.  Tobacco use disorder: Counseled to quit smoking cigarettes.  Other comorbidities include PVD, anxiety, hypertension, hypothyroidism  Diet Order             Diet heart healthy/carb modified Room service appropriate? Yes; Fluid consistency: Thin  Diet effective now                       Consultants: None  Procedures: None    Medications:    busPIRone  7.5 mg Oral TID   carvedilol  3.125 mg Oral BID WC   citalopram  40 mg Oral Daily   diltiazem  120 mg Oral Daily   enoxaparin (LOVENOX) injection  40 mg Subcutaneous Q24H   fluticasone furoate-vilanterol  1 puff Inhalation Daily   ipratropium-albuterol  3 mL Nebulization TID   levothyroxine  175 mcg Oral Daily   lisinopril  10 mg Oral Daily   nystatin   Topical BID   predniSONE  40 mg Oral Q breakfast   sodium chloride flush  3 mL Intravenous Q12H   Continuous Infusions:  cefTRIAXone (ROCEPHIN)  IV 1 g (12/04/20 1813)     Anti-infectives (From admission, onward)    Start     Dose/Rate Route Frequency Ordered Stop   12/02/20 1845  cefTRIAXone (ROCEPHIN) 1 g in sodium chloride 0.9 % 100 mL IVPB        1 g 200 mL/hr over 30 Minutes Intravenous Every 24 hours 12/02/20 1838 12/07/20 1844   12/02/20 1730  azithromycin (ZITHROMAX) tablet 500 mg        500 mg Oral  Once 12/02/20 1726 12/02/20 1956              Family Communication/Anticipated D/C date and plan/Code Status   DVT prophylaxis: enoxaparin (LOVENOX) injection 40 mg Start: 12/02/20 2200     Code Status: DNR  Family Communication: None Disposition  Plan: Plan to discharge home in 1 to 2 days   Status is: Inpatient  Remains inpatient appropriate because: Shortness of breath, hypoxia           Subjective:   She complains of generalized weakness, cough and difficulty breathing  Objective:    Vitals:   12/05/20 1522 12/05/20 1528 12/05/20 1533 12/05/20 1612  BP:    (!) 113/49  Pulse:    63  Resp:    20  Temp:    97.7 F (36.5 C)  TempSrc:    Oral  SpO2: 91% (!) 85% 92% 93%  Weight:      Height:       No data found.   Intake/Output Summary (Last 24 hours) at 12/05/2020 1709 Last data filed at 12/05/2020 1448 Gross per 24 hour  Intake 240 ml  Output 1250 ml  Net -1010 ml   Filed Weights    12/02/20 1612  Weight: 91 kg    Exam:  GEN: NAD SKIN: Psoriatic rash on the back and lower extremities.  Erythema under bilateral breasts is improving. EYES: No pallor or icterus ENT: MMM CV: RRR PULM: Decreased air entry bilaterally, occasional bilateral wheezing ABD: soft, obese, NT, +BS CNS: AAO x 3, non focal EXT: No edema or tenderness  I       Data Reviewed:   I have personally reviewed following labs and imaging studies:  Labs: Labs show the following:   Basic Metabolic Panel: Recent Labs  Lab 12/02/20 1612 12/03/20 0607  NA 141 138  K 3.5 3.6  CL 92* 93*  CO2 41* 38*  GLUCOSE 163* 171*  BUN 14 14  CREATININE 0.97 0.79  CALCIUM 8.8* 8.8*   GFR Estimated Creatinine Clearance: 69 mL/min (by C-G formula based on SCr of 0.79 mg/dL). Liver Function Tests: Recent Labs  Lab 12/02/20 1612  AST 14*  ALT 9  ALKPHOS 90  BILITOT 0.8  PROT 6.7  ALBUMIN 3.3*   No results for input(s): LIPASE, AMYLASE in the last 168 hours. No results for input(s): AMMONIA in the last 168 hours. Coagulation profile No results for input(s): INR, PROTIME in the last 168 hours.  CBC: Recent Labs  Lab 12/02/20 1612 12/03/20 0607  WBC 8.6 7.4  NEUTROABS 5.3  --   HGB 15.7* 14.6  HCT 52.4* 46.9*  MCV 95.4 93.4  PLT 225 210   Cardiac Enzymes: No results for input(s): CKTOTAL, CKMB, CKMBINDEX, TROPONINI in the last 168 hours. BNP (last 3 results) No results for input(s): PROBNP in the last 8760 hours. CBG: No results for input(s): GLUCAP in the last 168 hours. D-Dimer: No results for input(s): DDIMER in the last 72 hours. Hgb A1c: No results for input(s): HGBA1C in the last 72 hours. Lipid Profile: No results for input(s): CHOL, HDL, LDLCALC, TRIG, CHOLHDL, LDLDIRECT in the last 72 hours. Thyroid function studies: No results for input(s): TSH, T4TOTAL, T3FREE, THYROIDAB in the last 72 hours.  Invalid input(s): FREET3 Anemia work up: No results for input(s):  VITAMINB12, FOLATE, FERRITIN, TIBC, IRON, RETICCTPCT in the last 72 hours. Sepsis Labs: Recent Labs  Lab 12/02/20 1612 12/03/20 0607  WBC 8.6 7.4    Microbiology Recent Results (from the past 240 hour(s))  Resp Panel by RT-PCR (Flu A&B, Covid) Nasopharyngeal Swab     Status: None   Collection Time: 12/02/20  4:14 PM   Specimen: Nasopharyngeal Swab; Nasopharyngeal(NP) swabs in vial transport medium  Result Value Ref Range Status   SARS Coronavirus  2 by RT PCR NEGATIVE NEGATIVE Final    Comment: (NOTE) SARS-CoV-2 target nucleic acids are NOT DETECTED.  The SARS-CoV-2 RNA is generally detectable in upper respiratory specimens during the acute phase of infection. The lowest concentration of SARS-CoV-2 viral copies this assay can detect is 138 copies/mL. A negative result does not preclude SARS-Cov-2 infection and should not be used as the sole basis for treatment or other patient management decisions. A negative result may occur with  improper specimen collection/handling, submission of specimen other than nasopharyngeal swab, presence of viral mutation(s) within the areas targeted by this assay, and inadequate number of viral copies(<138 copies/mL). A negative result must be combined with clinical observations, patient history, and epidemiological information. The expected result is Negative.  Fact Sheet for Patients:  EntrepreneurPulse.com.au  Fact Sheet for Healthcare Providers:  IncredibleEmployment.be  This test is no t yet approved or cleared by the [place] FDA and  has been authorized for detection and/or diagnosis of SARS-CoV-2 by FDA under an Emergency Use Authorization (EUA). This EUA will remain  in effect (meaning this test can be used) for the duration of the COVID-19 declaration under Section 564(b)(1) of the Act, 21 U.S.C.section 360bbb-3(b)(1), unless the authorization is terminated  or revoked sooner.        Influenza A by PCR NEGATIVE NEGATIVE Final   Influenza B by PCR NEGATIVE NEGATIVE Final    Comment: (NOTE) The Xpert Xpress SARS-CoV-2/FLU/RSV plus assay is intended as an aid in the diagnosis of influenza from Nasopharyngeal swab specimens and should not be used as a sole basis for treatment. Nasal washings and aspirates are unacceptable for Xpert Xpress SARS-CoV-2/FLU/RSV testing.  Fact Sheet for Patients: EntrepreneurPulse.com.au  Fact Sheet for Healthcare Providers: IncredibleEmployment.be  This test is not yet approved or cleared by the [place] FDA and has been authorized for detection and/or diagnosis of SARS-CoV-2 by FDA under an Emergency Use Authorization (EUA). This EUA will remain in effect (meaning this test can be used) for the duration of the COVID-19 declaration under Section 564(b)(1) of the Act, 21 U.S.C. section 360bbb-3(b)(1), unless the authorization is terminated or revoked.  Performed at [facility], [address] [place], [place] [address]   Respiratory (~20 pathogens) panel by PCR     Status: None   Collection Time: 12/02/20  9:22 PM   Specimen: Nasopharyngeal Swab; Respiratory  Result Value Ref Range Status   Adenovirus NOT DETECTED NOT DETECTED Final   Coronavirus 229E NOT DETECTED NOT DETECTED Final    Comment: (NOTE) The Coronavirus on the Respiratory Panel, DOES NOT test for the novel  Coronavirus (2019 nCoV)    Coronavirus HKU1 NOT DETECTED NOT DETECTED Final   Coronavirus NL63 NOT DETECTED NOT DETECTED Final   Coronavirus OC43 NOT DETECTED NOT DETECTED Final   Metapneumovirus NOT DETECTED NOT DETECTED Final   Rhinovirus / Enterovirus NOT DETECTED NOT DETECTED Final   Influenza A NOT DETECTED NOT DETECTED Final   Influenza B NOT DETECTED NOT DETECTED Final   Parainfluenza Virus 1 NOT DETECTED NOT DETECTED Final   Parainfluenza Virus 2 NOT DETECTED NOT DETECTED Final   Parainfluenza Virus 3  NOT DETECTED NOT DETECTED Final   Parainfluenza Virus 4 NOT DETECTED NOT DETECTED Final   Respiratory Syncytial Virus NOT DETECTED NOT DETECTED Final   Bordetella pertussis NOT DETECTED NOT DETECTED Final   Bordetella Parapertussis NOT DETECTED NOT DETECTED Final   Chlamydophila pneumoniae NOT DETECTED NOT DETECTED Final   Mycoplasma pneumoniae NOT DETECTED NOT  DETECTED Final    Comment: Performed at [facility], [place] [address], [place], [place] [address]    Procedures and diagnostic studies:  No results found.             LOS: 3 days   Phillipa Morden  [facility]   Pager on [email or web address]. If 7PM-7AM, please contact night-coverage at [email or web address]     12/05/2020, 5:09 PM

## 2020-12-06 ENCOUNTER — Inpatient Hospital Stay: Payer: PPO

## 2020-12-06 MED ORDER — IPRATROPIUM-ALBUTEROL 0.5-2.5 (3) MG/3ML IN SOLN
3.0000 mL | Freq: Two times a day (BID) | RESPIRATORY_TRACT | Status: DC
  Administered 2020-12-06 – 2020-12-12 (×9): 3 mL via RESPIRATORY_TRACT
  Filled 2020-12-06 (×12): qty 3

## 2020-12-06 NOTE — Progress Notes (Signed)
SATURATION QUALIFICATIONS: (This note is used to comply with regulatory documentation for home oxygen)  Patient Saturations on Room Air at Rest = 85%   

## 2020-12-06 NOTE — Progress Notes (Addendum)
Progress Note    [name]  QVZ:[id number] DOB: [birth year]  DOA: 12/02/2020 PCP: Jearld Fenton, NP      Brief Narrative:    Medical records reviewed and are as summarized below:  [name] is a 70 y.o. female with medical history significant for morbid obesity, COPD, hypertension, hyperlipidemia, PAD, hypothyroidism, anxiety with panic disorder, who presented to the hospital with shortness of breath, productive cough with increased sputum production and wheezing.  Reportedly, her oxygen saturation was in the 70s.  VBG showed pH 7.29, PCO2 80 and PO2 was less than 31.  She was admitted to the hospital for acute exacerbation of COPD and acute hypoxemic and hypercapnic respiratory failure.    Assessment/Plan:   Principal Problem:   COPD with acute exacerbation (HCC) Active Problems:   Anxiety disorder   Adult hypothyroidism   Panic disorder without agoraphobia with moderate panic attacks   Atherosclerotic peripheral vascular disease with intermittent claudication (HCC)   Benign essential HTN   Psoriasis   Class 2 severe obesity due to excess calories with serious comorbidity and body mass index (BMI) of 37.0 to 37.9 in adult [facility])   Acute hypoxemic respiratory failure (HCC)   Body mass index is 35.54 kg/m.   COPD exacerbation: Continue bronchodilators, prednisone and IV Rocephin.  Repeat chest x-ray today because of worsening symptoms  Acute hypoxemic and hypercapnic respiratory failure, chronic hypoxemic and hypercapnic respiratory failure: Oxygen saturation dropped to 85% on room air at rest.  Oxygen requirement has gone up again from 3 to 4 L/min oxygen via nasal cannula.  Tobacco use disorder: Counseled to quit smoking cigarettes.  Other comorbidities include PVD, anxiety, hypertension, hypothyroidism  She does not feel well enough to go home today.  Diet Order             Diet heart healthy/carb modified Room service appropriate? Yes; Fluid  consistency: Thin  Diet effective now                      Consultants: None  Procedures: None    Medications:    busPIRone  7.5 mg Oral TID   carvedilol  3.125 mg Oral BID WC   citalopram  40 mg Oral Daily   diltiazem  120 mg Oral Daily   enoxaparin (LOVENOX) injection  40 mg Subcutaneous Q24H   fluticasone furoate-vilanterol  1 puff Inhalation Daily   ipratropium-albuterol  3 mL Nebulization BID   levothyroxine  175 mcg Oral Daily   lisinopril  10 mg Oral Daily   nystatin   Topical BID   predniSONE  40 mg Oral Q breakfast   sodium chloride flush  3 mL Intravenous Q12H   Continuous Infusions:  cefTRIAXone (ROCEPHIN)  IV Stopped (12/05/20 1903)     Anti-infectives (From admission, onward)    Start     Dose/Rate Route Frequency Ordered Stop   12/02/20 1845  cefTRIAXone (ROCEPHIN) 1 g in sodium chloride 0.9 % 100 mL IVPB        1 g 200 mL/hr over 30 Minutes Intravenous Every 24 hours 12/02/20 1838 12/07/20 1844   12/02/20 1730  azithromycin (ZITHROMAX) tablet 500 mg        500 mg Oral  Once 12/02/20 1726 12/02/20 1956              Family Communication/Anticipated D/C date and plan/Code Status   DVT prophylaxis: enoxaparin (LOVENOX) injection 40 mg Start: 12/02/20 2200  Code Status: DNR  Family Communication: None Disposition Plan: Plan to discharge home in 1 to 2 days   Status is: Inpatient  Remains inpatient appropriate because: Shortness of breath, hypoxia           Subjective:   Interval events noted.  She complains of productive cough and shortness of breath.  Estill Bamberg, RN,  attempted to walk with her but became very short of breath when she got up to the side of the bed and oxygen saturation dropped to 85% on room air.  Objective:    Vitals:   12/06/20 0534 12/06/20 0826 12/06/20 1045 12/06/20 1047  BP: (!) 142/64 134/66    Pulse: 78 78    Resp: 18 19    Temp: 97.8 F (36.6 C) 98.1 F (36.7 C)    TempSrc: Oral Oral     SpO2: 93% 90% (!) 85% 90%  Weight:      Height:       No data found.   Intake/Output Summary (Last 24 hours) at 12/06/2020 1205 Last data filed at 12/06/2020 1020 Gross per 24 hour  Intake 680 ml  Output 800 ml  Net -120 ml   Filed Weights   12/02/20 1612  Weight: 91 kg    Exam:  GEN: NAD SKIN: Psoriatic rash on the back, elbows and lower extremities. EYES: No pallor or icterus ENT: MMM CV: RRR PULM: Decreased air entry bilaterally.  Bibasilar rales.  No wheezing heard ABD: soft, obese, NT, +BS CNS: AAO x 3, non focal EXT: No edema or tenderness          Data Reviewed:   I have personally reviewed following labs and imaging studies:  Labs: Labs show the following:   Basic Metabolic Panel: Recent Labs  Lab 12/02/20 1612 12/03/20 0607  NA 141 138  K 3.5 3.6  CL 92* 93*  CO2 41* 38*  GLUCOSE 163* 171*  BUN 14 14  CREATININE 0.97 0.79  CALCIUM 8.8* 8.8*   GFR Estimated Creatinine Clearance: 69 mL/min (by C-G formula based on SCr of 0.79 mg/dL). Liver Function Tests: Recent Labs  Lab 12/02/20 1612  AST 14*  ALT 9  ALKPHOS 90  BILITOT 0.8  PROT 6.7  ALBUMIN 3.3*   No results for input(s): LIPASE, AMYLASE in the last 168 hours. No results for input(s): AMMONIA in the last 168 hours. Coagulation profile No results for input(s): INR, PROTIME in the last 168 hours.  CBC: Recent Labs  Lab 12/02/20 1612 12/03/20 0607  WBC 8.6 7.4  NEUTROABS 5.3  --   HGB 15.7* 14.6  HCT 52.4* 46.9*  MCV 95.4 93.4  PLT 225 210   Cardiac Enzymes: No results for input(s): CKTOTAL, CKMB, CKMBINDEX, TROPONINI in the last 168 hours. BNP (last 3 results) No results for input(s): PROBNP in the last 8760 hours. CBG: No results for input(s): GLUCAP in the last 168 hours. D-Dimer: No results for input(s): DDIMER in the last 72 hours. Hgb A1c: No results for input(s): HGBA1C in the last 72 hours. Lipid Profile: No results for input(s): CHOL, HDL, LDLCALC,  TRIG, CHOLHDL, LDLDIRECT in the last 72 hours. Thyroid function studies: No results for input(s): TSH, T4TOTAL, T3FREE, THYROIDAB in the last 72 hours.  Invalid input(s): FREET3 Anemia work up: No results for input(s): VITAMINB12, FOLATE, FERRITIN, TIBC, IRON, RETICCTPCT in the last 72 hours. Sepsis Labs: Recent Labs  Lab 12/02/20 1612 12/03/20 0607  WBC 8.6 7.4    Microbiology Recent Results (from the  past 240 hour(s))  Resp Panel by RT-PCR (Flu A&B, Covid) Nasopharyngeal Swab     Status: None   Collection Time: 12/02/20  4:14 PM   Specimen: Nasopharyngeal Swab; Nasopharyngeal(NP) swabs in vial transport medium  Result Value Ref Range Status   SARS Coronavirus 2 by RT PCR NEGATIVE NEGATIVE Final    Comment: (NOTE) SARS-CoV-2 target nucleic acids are NOT DETECTED.  The SARS-CoV-2 RNA is generally detectable in upper respiratory specimens during the acute phase of infection. The lowest concentration of SARS-CoV-2 viral copies this assay can detect is 138 copies/mL. A negative result does not preclude SARS-Cov-2 infection and should not be used as the sole basis for treatment or other patient management decisions. A negative result may occur with  improper specimen collection/handling, submission of specimen other than nasopharyngeal swab, presence of viral mutation(s) within the areas targeted by this assay, and inadequate number of viral copies(<138 copies/mL). A negative result must be combined with clinical observations, patient history, and epidemiological information. The expected result is Negative.  Fact Sheet for Patients:  EntrepreneurPulse.com.au  Fact Sheet for Healthcare Providers:  IncredibleEmployment.be  This test is no t yet approved or cleared by the [place] FDA and  has been authorized for detection and/or diagnosis of SARS-CoV-2 by FDA under an Emergency Use Authorization (EUA). This EUA will remain  in effect  (meaning this test can be used) for the duration of the COVID-19 declaration under Section 564(b)(1) of the Act, 21 U.S.C.section 360bbb-3(b)(1), unless the authorization is terminated  or revoked sooner.       Influenza A by PCR NEGATIVE NEGATIVE Final   Influenza B by PCR NEGATIVE NEGATIVE Final    Comment: (NOTE) The Xpert Xpress SARS-CoV-2/FLU/RSV plus assay is intended as an aid in the diagnosis of influenza from Nasopharyngeal swab specimens and should not be used as a sole basis for treatment. Nasal washings and aspirates are unacceptable for Xpert Xpress SARS-CoV-2/FLU/RSV testing.  Fact Sheet for Patients: EntrepreneurPulse.com.au  Fact Sheet for Healthcare Providers: IncredibleEmployment.be  This test is not yet approved or cleared by the [place] FDA and has been authorized for detection and/or diagnosis of SARS-CoV-2 by FDA under an Emergency Use Authorization (EUA). This EUA will remain in effect (meaning this test can be used) for the duration of the COVID-19 declaration under Section 564(b)(1) of the Act, 21 U.S.C. section 360bbb-3(b)(1), unless the authorization is terminated or revoked.  Performed at [facility], [address] [place], [place] [address]   Respiratory (~20 pathogens) panel by PCR     Status: None   Collection Time: 12/02/20  9:22 PM   Specimen: Nasopharyngeal Swab; Respiratory  Result Value Ref Range Status   Adenovirus NOT DETECTED NOT DETECTED Final   Coronavirus 229E NOT DETECTED NOT DETECTED Final    Comment: (NOTE) The Coronavirus on the Respiratory Panel, DOES NOT test for the novel  Coronavirus (2019 nCoV)    Coronavirus HKU1 NOT DETECTED NOT DETECTED Final   Coronavirus NL63 NOT DETECTED NOT DETECTED Final   Coronavirus OC43 NOT DETECTED NOT DETECTED Final   Metapneumovirus NOT DETECTED NOT DETECTED Final   Rhinovirus / Enterovirus NOT DETECTED NOT DETECTED Final   Influenza A  NOT DETECTED NOT DETECTED Final   Influenza B NOT DETECTED NOT DETECTED Final   Parainfluenza Virus 1 NOT DETECTED NOT DETECTED Final   Parainfluenza Virus 2 NOT DETECTED NOT DETECTED Final   Parainfluenza Virus 3 NOT DETECTED NOT DETECTED Final   Parainfluenza Virus 4 NOT  DETECTED NOT DETECTED Final   Respiratory Syncytial Virus NOT DETECTED NOT DETECTED Final   Bordetella pertussis NOT DETECTED NOT DETECTED Final   Bordetella Parapertussis NOT DETECTED NOT DETECTED Final   Chlamydophila pneumoniae NOT DETECTED NOT DETECTED Final   Mycoplasma pneumoniae NOT DETECTED NOT DETECTED Final    Comment: Performed at [facility], [place] [address], [place], [place] [address]    Procedures and diagnostic studies:  No results found.             LOS: 4 days   Denay Pleitez  [facility]   Pager on [email or web address]. If 7PM-7AM, please contact night-coverage at [email or web address]     12/06/2020, 12:05 PM

## 2020-12-07 ENCOUNTER — Inpatient Hospital Stay: Payer: PPO

## 2020-12-07 MED ORDER — PREDNISONE 20 MG PO TABS
40.0000 mg | ORAL_TABLET | Freq: Every day | ORAL | Status: AC
  Administered 2020-12-08 – 2020-12-11 (×4): 40 mg via ORAL
  Filled 2020-12-07 (×4): qty 2

## 2020-12-07 MED ORDER — IOHEXOL 350 MG/ML SOLN
100.0000 mL | Freq: Once | INTRAVENOUS | Status: AC | PRN
  Administered 2020-12-07: 100 mL via INTRAVENOUS

## 2020-12-07 NOTE — Progress Notes (Signed)
PROGRESS NOTE    [name]  LHT:[id number] DOB: 01/30/1951 DOA: 12/02/2020 PCP: Jearld Fenton, NP    Brief Narrative:  70 year old with history of morbid obesity, COPD, hypertension, hyperlipidemia, hypothyroidism and anxiety and panic disorder presented to the hospital with shortness of breath, productive cough and wheezing.  Does have history of COPD and chronic bronchitis.  Not on home oxygen.  On arrival, 70% on room air.  Admitted with COPD exacerbation.   Assessment & Plan:   Principal Problem:   COPD with acute exacerbation ([place]) Active Problems:   Anxiety disorder   Adult hypothyroidism   Panic disorder without agoraphobia with moderate panic attacks   Atherosclerotic peripheral vascular disease with intermittent claudication (HCC)   Benign essential HTN   Psoriasis   Class 2 severe obesity due to excess calories with serious comorbidity and body mass index (BMI) of 37.0 to 37.9 in adult [facility])   Acute hypoxemic respiratory failure (HCC)  COPD with acute exacerbation, acute hypoxemic respiratory failure with suspected underlying chronic hypoxemia: Presented with oxygen 70% on room air.  Today she is 85% on room air at rest. Still with significant symptoms. Aggressive bronchodilator therapy, extended prednisone course, inhalational steroids, scheduled and as needed bronchodilators, deep breathing exercises, incentive spirometry, chest physiotherapy and respiratory therapy consult. Antibiotics due to severity of symptoms. Supplemental oxygen to keep saturations more than 92%. Evaluate for ambulatory oxygen need.  She will need oxygen to go home. No adequate response for the last 5 days, will check CT angiogram today. Mobilize with PT OT today.  Patient has not been out of bed for the last 5 days in the hospital.  Smoker: Counseled to quit.  On nicotine patch.  Chronic medical issues including anxiety, hypertension, hypothyroidism Remained stable on home  medications.  PT OT.  CT angiogram of the chest.  Mobility.  Home oxygen arrangements.   DVT prophylaxis: enoxaparin (LOVENOX) injection 40 mg Start: 12/02/20 2200   Code Status: DNR Family Communication: None Disposition Plan: Status is: Inpatient  Remains inpatient appropriate because: Still with significant wheezing and shortness of breath.         Consultants:  None  Procedures:  None  Antimicrobials:  None   Subjective: Patient seen and examined.  Fairly asymptomatic at rest but difficulty with mobility.  She work with PT OT and was found very deconditioned.  Some improvement in breathing, however very uncomfortable on mobility.  Objective: Vitals:   12/06/20 2022 12/07/20 0546 12/07/20 0729 12/07/20 1223  BP:  (!) 128/59 130/60 128/64  Pulse:  67 66 69  Resp:  20 20 18   Temp:  (!) 97.5 F (36.4 C) 97.7 F (36.5 C) 98.2 F (36.8 C)  TempSrc:   Oral Oral  SpO2: 92% 96% (!) 89% 92%  Weight:      Height:        Intake/Output Summary (Last 24 hours) at 12/07/2020 1316 Last data filed at 12/06/2020 1550 Gross per 24 hour  Intake 240 ml  Output 500 ml  Net -260 ml   Filed Weights   12/02/20 1612  Weight: 91 kg    Examination:  General exam: Appears calm and comfortable at rest.  On 3 L oxygen. Wheezing with chest tightness on mobility. Respiratory system: Clear to auscultation.  Bilateral poor air entry. Cardiovascular system: S1 & S2 heard, RRR. No JVD, murmurs, rubs, gallops or clicks. No pedal edema. Gastrointestinal system: Abdomen is nondistended, soft and nontender. No organomegaly or masses felt. Normal bowel  sounds heard. Central nervous system: Alert and oriented. No focal neurological deficits. Extremities: Symmetric 5 x 5 power. Skin: No rashes, lesions or ulcers Psychiatry: Judgement and insight appear normal. Mood & affect appropriate.     Data Reviewed: I have personally reviewed following labs and imaging studies  CBC: Recent  Labs  Lab 12/02/20 1612 12/03/20 0607  WBC 8.6 7.4  NEUTROABS 5.3  --   HGB 15.7* 14.6  HCT 52.4* 46.9*  MCV 95.4 93.4  PLT 225 867   Basic Metabolic Panel: Recent Labs  Lab 12/02/20 1612 12/03/20 0607  NA 141 138  K 3.5 3.6  CL 92* 93*  CO2 41* 38*  GLUCOSE 163* 171*  BUN 14 14  CREATININE 0.97 0.79  CALCIUM 8.8* 8.8*   GFR: Estimated Creatinine Clearance: 69 mL/min (by C-G formula based on SCr of 0.79 mg/dL). Liver Function Tests: Recent Labs  Lab 12/02/20 1612  AST 14*  ALT 9  ALKPHOS 90  BILITOT 0.8  PROT 6.7  ALBUMIN 3.3*   No results for input(s): LIPASE, AMYLASE in the last 168 hours. No results for input(s): AMMONIA in the last 168 hours. Coagulation Profile: No results for input(s): INR, PROTIME in the last 168 hours. Cardiac Enzymes: No results for input(s): CKTOTAL, CKMB, CKMBINDEX, TROPONINI in the last 168 hours. BNP (last 3 results) No results for input(s): PROBNP in the last 8760 hours. HbA1C: No results for input(s): HGBA1C in the last 72 hours. CBG: No results for input(s): GLUCAP in the last 168 hours. Lipid Profile: No results for input(s): CHOL, HDL, LDLCALC, TRIG, CHOLHDL, LDLDIRECT in the last 72 hours. Thyroid Function Tests: No results for input(s): TSH, T4TOTAL, FREET4, T3FREE, THYROIDAB in the last 72 hours. Anemia Panel: No results for input(s): VITAMINB12, FOLATE, FERRITIN, TIBC, IRON, RETICCTPCT in the last 72 hours. Sepsis Labs: No results for input(s): PROCALCITON, LATICACIDVEN in the last 168 hours.  Recent Results (from the past 240 hour(s))  Resp Panel by RT-PCR (Flu A&B, Covid) Nasopharyngeal Swab     Status: None   Collection Time: 12/02/20  4:14 PM   Specimen: Nasopharyngeal Swab; Nasopharyngeal(NP) swabs in vial transport medium  Result Value Ref Range Status   SARS Coronavirus 2 by RT PCR NEGATIVE NEGATIVE Final    Comment: (NOTE) SARS-CoV-2 target nucleic acids are NOT DETECTED.  The SARS-CoV-2 RNA is  generally detectable in upper respiratory specimens during the acute phase of infection. The lowest concentration of SARS-CoV-2 viral copies this assay can detect is 138 copies/mL. A negative result does not preclude SARS-Cov-2 infection and should not be used as the sole basis for treatment or other patient management decisions. A negative result may occur with  improper specimen collection/handling, submission of specimen other than nasopharyngeal swab, presence of viral mutation(s) within the areas targeted by this assay, and inadequate number of viral copies(<138 copies/mL). A negative result must be combined with clinical observations, patient history, and epidemiological information. The expected result is Negative.  Fact Sheet for Patients:  EntrepreneurPulse.com.au  Fact Sheet for Healthcare Providers:  IncredibleEmployment.be  This test is no t yet approved or cleared by the [place] FDA and  has been authorized for detection and/or diagnosis of SARS-CoV-2 by FDA under an Emergency Use Authorization (EUA). This EUA will remain  in effect (meaning this test can be used) for the duration of the COVID-19 declaration under Section 564(b)(1) of the Act, 21 U.S.C.section 360bbb-3(b)(1), unless the authorization is terminated  or revoked sooner.  Influenza A by PCR NEGATIVE NEGATIVE Final   Influenza B by PCR NEGATIVE NEGATIVE Final    Comment: (NOTE) The Xpert Xpress SARS-CoV-2/FLU/RSV plus assay is intended as an aid in the diagnosis of influenza from Nasopharyngeal swab specimens and should not be used as a sole basis for treatment. Nasal washings and aspirates are unacceptable for Xpert Xpress SARS-CoV-2/FLU/RSV testing.  Fact Sheet for Patients: EntrepreneurPulse.com.au  Fact Sheet for Healthcare Providers: IncredibleEmployment.be  This test is not yet approved or cleared by the [place] FDA and has been authorized for detection and/or diagnosis of SARS-CoV-2 by FDA under an Emergency Use Authorization (EUA). This EUA will remain in effect (meaning this test can be used) for the duration of the COVID-19 declaration under Section 564(b)(1) of the Act, 21 U.S.C. section 360bbb-3(b)(1), unless the authorization is terminated or revoked.  Performed at [facility], [address] [place], [place] [address]   Respiratory (~20 pathogens) panel by PCR     Status: None   Collection Time: 12/02/20  9:22 PM   Specimen: Nasopharyngeal Swab; Respiratory  Result Value Ref Range Status   Adenovirus NOT DETECTED NOT DETECTED Final   Coronavirus 229E NOT DETECTED NOT DETECTED Final    Comment: (NOTE) The Coronavirus on the Respiratory Panel, DOES NOT test for the novel  Coronavirus (2019 nCoV)    Coronavirus HKU1 NOT DETECTED NOT DETECTED Final   Coronavirus NL63 NOT DETECTED NOT DETECTED Final   Coronavirus OC43 NOT DETECTED NOT DETECTED Final   Metapneumovirus NOT DETECTED NOT DETECTED Final   Rhinovirus / Enterovirus NOT DETECTED NOT DETECTED Final   Influenza A NOT DETECTED NOT DETECTED Final   Influenza B NOT DETECTED NOT DETECTED Final   Parainfluenza Virus 1 NOT DETECTED NOT DETECTED Final   Parainfluenza Virus 2 NOT DETECTED NOT DETECTED Final   Parainfluenza Virus 3 NOT DETECTED NOT DETECTED Final   Parainfluenza Virus 4 NOT DETECTED NOT DETECTED Final   Respiratory Syncytial Virus NOT DETECTED NOT DETECTED Final   Bordetella pertussis NOT DETECTED NOT DETECTED Final   Bordetella Parapertussis NOT DETECTED NOT DETECTED Final   Chlamydophila pneumoniae NOT DETECTED NOT DETECTED Final   Mycoplasma pneumoniae NOT DETECTED NOT DETECTED Final    Comment: Performed at [facility], [place]. [address] [place],  [address]         Radiology Studies: DG Chest Port 1 View  Result Date: 12/06/2020 CLINICAL DATA:  Short of breath.  History of  COPD and hypertension. EXAM: PORTABLE CHEST 1 VIEW COMPARISON:  12/02/2020. FINDINGS: Mild opacity noted at both lung bases, increased from the prior study, consistent with atelectasis. Remainder of the lungs is clear. No definite pleural effusion.  No evidence of a pneumothorax. Cardiac silhouette is normal in size. IMPRESSION: 1. Mild lung base opacities developing since the prior study consistent with atelectasis. No other change. No convincing pneumonia and no evidence of pulmonary edema. Electronically Signed   By: Lajean Manes M.D.   On: 12/06/2020 14:00        Scheduled Meds:  busPIRone  7.5 mg Oral TID   carvedilol  3.125 mg Oral BID WC   citalopram  40 mg Oral Daily   diltiazem  120 mg Oral Daily   enoxaparin (LOVENOX) injection  40 mg Subcutaneous Q24H   fluticasone furoate-vilanterol  1 puff Inhalation Daily   ipratropium-albuterol  3 mL Nebulization BID   levothyroxine  175 mcg Oral Daily   lisinopril  10 mg Oral Daily  nystatin   Topical BID   [START ON 12/08/2020] predniSONE  40 mg Oral Q breakfast   sodium chloride flush  3 mL Intravenous Q12H   Continuous Infusions:   LOS: 5 days    Time spent: 32 minutes    Barb Merino, MD [facility] Pager [phone]

## 2020-12-07 NOTE — Evaluation (Signed)
Occupational Therapy Evaluation Patient Details Name: [name] MRN: [id number] DOB: [birth year] Today's Date: 12/07/2020   History of Present Illness 70 y.o. female with medical history significant for morbid obesity, COPD, hypertension, hyperlipidemia, PAD, hypothyroidism, anxiety with panic disorder, who presented to the hospital with shortness of breath, productive cough with increased sputum production and wheezing. She was admitted to the hospital for acute exacerbation of COPD and acute hypoxemic and hypercapnic respiratory failure.   Clinical Impression   Patient presenting with decreased Ind in self care, balance, functional mobility/transfer, endurance, and safety awareness. Patient reports living at home in an apartment alone with use of rollator for functional mobility. Patient currently on 3 Ls O2 via [place] and pt reports no use at baseline. Her son is able to bring her groceries and take her to appointments as needed.Pt does fatigue early with functional tasks. She needs mod A initially to stand from bed but all other transfers are min A with use of RW. Pt transfers to [facility] for transfer practice and needs to take seated rest break before returning back to sleep. Patient will benefit from acute OT to increase overall independence in the areas of ADLs, functional mobility, and safety awareness in order to safely discharge to next venue of care.       Recommendations for follow up therapy are one component of a multi-disciplinary discharge planning process, led by the attending physician.  Recommendations may be updated based on patient status, additional functional criteria and insurance authorization.   Follow Up Recommendations  Skilled nursing-short term rehab (<3 hours/day)    Assistance Recommended at Discharge Frequent or constant Supervision/Assistance  Functional Status Assessment  Patient has had a recent decline in their functional status and demonstrates the ability to make  significant improvements in function in a reasonable and predictable amount of time.  Equipment Recommendations  BSC       Precautions / Restrictions Precautions Precautions: Fall      Mobility Bed Mobility Overal bed mobility: Needs Assistance Bed Mobility: Supine to Sit;Sit to Supine     Supine to sit: Min assist Sit to supine: Min assist   General bed mobility comments: for trunk support    Transfers Overall transfer level: Needs assistance Equipment used: Rolling walker (2 wheels) Transfers: Sit to/from Stand Sit to Stand: Min assist;Mod assist           General transfer comment: mod A to stand from bed initially but progressing to min A for sit <>stand with cuing for technique and hand placement      Balance Overall balance assessment: Needs assistance Sitting-balance support: Feet supported;Bilateral upper extremity supported Sitting balance-Leahy Scale: Good     Standing balance support: During functional activity;Reliant on assistive device for balance Standing balance-Leahy Scale: Fair                             ADL either performed or assessed with clinical judgement   ADL Overall ADL's : Needs assistance/impaired                         Toilet Transfer: Minimal assistance;BSC;Standard walker;Stand-pivot   Toileting- Clothing Manipulation and Hygiene: Minimal assistance;Sit to/from stand       Functional mobility during ADLs: Min guard;Minimal assistance;Rolling walker (2 wheels)       Vision Patient Visual Report: No change from baseline  Pertinent Vitals/Pain Pain Assessment: No/denies pain     Hand Dominance Right   Extremity/Trunk Assessment Upper Extremity Assessment Upper Extremity Assessment: Generalized weakness   Lower Extremity Assessment Lower Extremity Assessment: Generalized weakness       Communication Communication Communication: No difficulties   Cognition Arousal/Alertness:  Awake/alert Behavior During Therapy: WFL for tasks assessed/performed Overall Cognitive Status: Within Functional Limits for tasks assessed                                 General Comments: A &Ox4. Pt is very pleasant and cooperative overall.                Home Living Family/patient expects to be discharged to:: Private residence Living Arrangements: Alone Available Help at Discharge: Family;Available PRN/intermittently Type of Home: Apartment Home Access: Level entry     Home Layout: One level     Bathroom Shower/Tub: Teacher, early years/pre: Standard     Home Equipment: Rollator (4 wheels)          Prior Functioning/Environment Prior Level of Function : Independent/Modified Independent             Mobility Comments: Pt reports use of rollator for functional mobility at home. ADLs Comments: Pt reports being independent with self care at baseline. Her son would bring groceries and assist with taking her to appointments. Pt does not use O2 at baseline and reports things have been more difficulty for her recently.        OT Problem List: Decreased strength;Decreased activity tolerance;Impaired balance (sitting and/or standing);Decreased safety awareness;Cardiopulmonary status limiting activity;Decreased knowledge of use of DME or AE      OT Treatment/Interventions: Self-care/ADL training;Energy conservation;Balance training;Modalities;Therapeutic exercise;Manual therapy;Therapeutic activities;Patient/family education    OT Goals(Current goals can be found in the care plan section) Acute Rehab OT Goals Patient Stated Goal: to get better OT Goal Formulation: With patient Time For Goal Achievement: 12/21/20 Potential to Achieve Goals: Good ADL Goals Pt Will Perform Grooming: (P) with supervision Pt Will Perform Lower Body Dressing: (P) with supervision Pt Will Transfer to Toilet: (P) with supervision Pt Will Perform Toileting - Clothing  Manipulation and hygiene: (P) with supervision  OT Frequency: Min 2X/week   Barriers to D/C: Decreased caregiver support             AM-PAC OT "6 Clicks" Daily Activity     Outcome Measure Help from another person eating meals?: None Help from another person taking care of personal grooming?: None Help from another person toileting, which includes using toliet, bedpan, or urinal?: A Little Help from another person bathing (including washing, rinsing, drying)?: A Little Help from another person to put on and taking off regular upper body clothing?: None Help from another person to put on and taking off regular lower body clothing?: A Little 6 Click Score: 21   End of Session Equipment Utilized During Treatment: Rolling walker (2 wheels);Oxygen Nurse Communication: Mobility status  Activity Tolerance: Patient tolerated treatment well Patient left: in bed;with call bell/phone within reach;with bed alarm set  OT Visit Diagnosis: Unsteadiness on feet (R26.81);Muscle weakness (generalized) (M62.81);History of falling (Z91.81)                Time: 1937-9024 OT Time Calculation (min): 20 min Charges:  OT General Charges $OT Visit: 1 Visit OT Evaluation $OT Eval Moderate Complexity: 1 Mod OT Treatments $Self Care/Home Management : 8-22 mins  Darleen Crocker, MS, OTR/L , CBIS ascom [phone]  12/07/20, 1:13 PM

## 2020-12-07 NOTE — Progress Notes (Signed)
PT Cancellation Note  Patient Details Name: [name] MRN: [id number] DOB: [birth year]   Cancelled Treatment:    Reason Eval/Treat Not Completed: Patient at procedure or test/unavailable   Lieutenant Diego PT, DPT 2:19 PM,12/07/20

## 2020-12-07 NOTE — Progress Notes (Signed)
To whom it may concern:  The above named patient will require a short term nursing home stay- anticipated 30 days or less- for strengthening and rehabilitation.  Plan is for return home.

## 2020-12-07 NOTE — TOC Initial Note (Signed)
Transition of Care [facility]) - Initial/Assessment Note    Patient Details  Name: [name] MRN: [id number] Date of Birth: 21-Feb-1950  Transition of [facility]) CM/SW Contact:    Shelbie Hutching, RN Phone Number: 12/07/2020, 2:07 PM  Clinical Narrative:                 Patient admitted to the hospital with COPD exacerbation requiring acute O2 at 4 L.  Patient will need oxygen at discharge.  RNCM met with patient at the bedside today.  Patient is from home where she lives alone but her son lives in an apartment under hers.  Patient has a cane.  Her family provides transportation for her.  PT and OT recommend SNF for rehab and patient agrees, she prefers [facility].  Bed search started.  Patient does not want [facility] or [facility].    Expected Discharge Plan: Skilled Nursing Facility Barriers to Discharge: Continued Medical Work up   Patient Goals and CMS Choice Patient states their goals for this hospitalization and ongoing recovery are:: Patient wants to go to SNF for rehab CMS Medicare.gov Compare Post Acute Care list provided to:: Patient Choice offered to / list presented to : Patient  Expected Discharge Plan and Services Expected Discharge Plan: [place]   Discharge Planning Services: CM Consult Post Acute Care Choice: [place] Living arrangements for the past 2 months: Apartment                 DME Arranged: N/A DME Agency: NA       HH Arranged: NA [place] Agency: NA        Prior Living Arrangements/Services Living arrangements for the past 2 months: Apartment Lives with:: Self Patient language and need for interpreter reviewed:: Yes Do you feel safe going back to the place where you live?: Yes      Need for Family Participation in Patient Care: Yes (Comment) Care giver support system in place?: Yes (comment) (son) Current home services: DME (cane) Criminal Activity/Legal Involvement Pertinent to Current  Situation/Hospitalization: No - Comment as needed  Activities of Daily Living Home Assistive Devices/Equipment: Dentures (specify type), Eyeglasses, Walker (specify type), Cane (specify quad or straight) ADL Screening (condition at time of admission) Patient's cognitive ability adequate to safely complete daily activities?: Yes Is the patient deaf or have difficulty hearing?: No Does the patient have difficulty seeing, even when wearing glasses/contacts?: No Does the patient have difficulty concentrating, remembering, or making decisions?: No Patient able to express need for assistance with ADLs?: Yes Does the patient have difficulty dressing or bathing?: No Independently performs ADLs?: Yes (appropriate for developmental age) Does the patient have difficulty walking or climbing stairs?: Yes Weakness of Legs: Both Weakness of Arms/Hands: None  Permission Sought/Granted Permission sought to share information with : Case Manager, Family Supports, Customer service manager Permission granted to share information with : Yes, Verbal Permission Granted  Share Information with NAME: Kimm Sider  Permission granted to share info w AGENCY: SNF's  Permission granted to share info w Relationship: son  Permission granted to share info w Contact Information: [phone]  Emotional Assessment Appearance:: Appears stated age Attitude/Demeanor/Rapport: Engaged Affect (typically observed): Accepting Orientation: : Oriented to Self, Oriented to Place, Oriented to  Time, Oriented to Situation Alcohol / Substance Use: Not Applicable Psych Involvement: No (comment)  Admission diagnosis:  Acute respiratory failure with hypoxia ([place]) [J96.01] Acute hypoxemic respiratory failure ([place]) [J96.01] Patient Active Problem List   Diagnosis  Date Noted   Acute hypoxemic respiratory failure ([place]) 12/02/2020   Stage 3a chronic kidney disease ([place]) 08/01/2020   Psoriasis 07/26/2020   Class 2 severe  obesity due to excess calories with serious comorbidity and body mass index (BMI) of 37.0 to 37.9 in adult [facility]) 07/26/2020   COPD with acute exacerbation ([place]) 10/28/2017   Thoracic aortic atherosclerosis ([place]) 02/12/2017   Chronic hip pain 05/07/2016   Insomnia 01/10/2016   Bilateral carotid artery stenosis 08/22/2015   Atherosclerotic peripheral vascular disease with intermittent claudication ([place]) 08/04/2015   Benign essential HTN 08/04/2015   Mixed hyperlipidemia 08/04/2015   Panic disorder without agoraphobia with moderate panic attacks 07/01/2015   Prediabetes 06/09/2015   Clinical depression 08/19/2014   Adult hypothyroidism 08/19/2014   Anxiety disorder 06/23/2014   Degeneration of intervertebral disc of lumbosacral region 03/22/2008   PCP:  Jearld Fenton, NP Pharmacy:   [facility], [place], [employer] [place] [address] Phone: [phone] Fax: [place], [place] [address] [place] [address] Phone: [phone] Fax: [phone]     Social Determinants of Health (SDOH) Interventions    Readmission Risk Interventions No flowsheet data found.

## 2020-12-07 NOTE — NC FL2 (Signed)
[place] LEVEL OF CARE SCREENING TOOL     IDENTIFICATION  Patient Name: [name] Birthdate: 06-13-1950 Sex: female Admission Date (Current Location): 12/02/2020  [facility] and [place] Number:  Engineering geologist and Address:  [facility], [address] [place], [place] [address]      Provider Number: [id number]  Attending Physician Name and Address:  Barb Merino, MD  Relative Name and Phone Number:  Emonnie Cannady (son) [phone]    Current Level of Care: Hospital Recommended Level of Care: [place] Prior Approval Number:    Date Approved/Denied:   PASRR Number: pending  Discharge Plan: SNF    Current Diagnoses: Patient Active Problem List   Diagnosis Date Noted   Acute hypoxemic respiratory failure ([place]) 12/02/2020   Stage 3a chronic kidney disease ([place]) 08/01/2020   Psoriasis 07/26/2020   Class 2 severe obesity due to excess calories with serious comorbidity and body mass index (BMI) of 37.0 to 37.9 in adult [facility]) 07/26/2020   COPD with acute exacerbation ([place]) 10/28/2017   Thoracic aortic atherosclerosis ([place]) 02/12/2017   Chronic hip pain 05/07/2016   Insomnia 01/10/2016   Bilateral carotid artery stenosis 08/22/2015   Atherosclerotic peripheral vascular disease with intermittent claudication (HCC) 08/04/2015   Benign essential HTN 08/04/2015   Mixed hyperlipidemia 08/04/2015   Panic disorder without agoraphobia with moderate panic attacks 07/01/2015   Prediabetes 06/09/2015   Clinical depression 08/19/2014   Adult hypothyroidism 08/19/2014   Anxiety disorder 06/23/2014   Degeneration of intervertebral disc of lumbosacral region 03/22/2008    Orientation RESPIRATION BLADDER Height & Weight     Self, Time, Situation, Place  Normal ([place] 4L) Continent Weight: 91 kg Height:  5\' 3"  (160 cm)  BEHAVIORAL SYMPTOMS/MOOD NEUROLOGICAL BOWEL NUTRITION STATUS      Continent Diet  AMBULATORY STATUS  COMMUNICATION OF NEEDS Skin   Limited Assist Verbally Normal                       Personal Care Assistance Level of Assistance  Bathing, Feeding, Dressing Bathing Assistance: Limited assistance Feeding assistance: Independent Dressing Assistance: Limited assistance     Functional Limitations Info  Sight, Hearing, Speech Sight Info: Adequate Hearing Info: Adequate Speech Info: Adequate    SPECIAL CARE FACTORS FREQUENCY  PT (By licensed PT), OT (By licensed OT)     PT Frequency: 5 times per week OT Frequency: 5 times per week            Contractures Contractures Info: Not present    Additional Factors Info  Code Status, Allergies Code Status Info: DNR Allergies Info: Cipro, mucinex, oxycodone, oxycodone-acetaminophen, sulfa           Current Medications (12/07/2020):  This is the current hospital active medication list Current Facility-Administered Medications  Medication Dose Route Frequency Provider Last Rate Last Admin   benzonatate (TESSALON) capsule 100 mg  100 mg Oral TID PRN Jennye Boroughs, MD   100 mg at 12/07/20 0749   busPIRone (BUSPAR) tablet 7.5 mg  7.5 mg Oral TID Clarnce Flock, MD   7.5 mg at 12/07/20 0748   carvedilol (COREG) tablet 3.125 mg  3.125 mg Oral BID WC Clarnce Flock, MD   3.125 mg at 12/07/20 0749   citalopram (CELEXA) tablet 40 mg  40 mg Oral Daily Clarnce Flock, MD   40 mg at 12/07/20 0749   clonazePAM (KLONOPIN) tablet 0.5-1 mg  0.5-1 mg Oral TID  PRN Clarnce Flock, MD   1 mg at 12/07/20 0225   diltiazem (CARDIZEM CD) 24 hr capsule 120 mg  120 mg Oral Daily Clarnce Flock, MD   120 mg at 12/07/20 0749   enoxaparin (LOVENOX) injection 40 mg  40 mg Subcutaneous Q24H Clarnce Flock, MD   40 mg at 12/06/20 2012   fluticasone furoate-vilanterol (BREO ELLIPTA) 200-25 MCG/ACT 1 puff  1 puff Inhalation Daily Vira Blanco, RPH   1 puff at 12/07/20 0748   guaiFENesin-dextromethorphan (ROBITUSSIN DM) 100-10 MG/5ML  syrup 5 mL  5 mL Oral Q4H PRN Jennye Boroughs, MD   5 mL at 12/07/20 0749   ibuprofen (ADVIL) tablet 400 mg  400 mg Oral Q6H PRN Clarnce Flock, MD   400 mg at 12/06/20 1813   ipratropium-albuterol (DUONEB) 0.5-2.5 (3) MG/3ML nebulizer solution 3 mL  3 mL Nebulization Q6H PRN Jennye Boroughs, MD   3 mL at 12/06/20 1219   ipratropium-albuterol (DUONEB) 0.5-2.5 (3) MG/3ML nebulizer solution 3 mL  3 mL Nebulization BID Jennye Boroughs, MD   3 mL at 12/07/20 0754   levothyroxine (SYNTHROID) tablet 175 mcg  175 mcg Oral Daily Clarnce Flock, MD   175 mcg at 12/07/20 0448   lisinopril (ZESTRIL) tablet 10 mg  10 mg Oral Daily Clarnce Flock, MD   10 mg at 12/07/20 0749   morphine 2 MG/ML injection 2 mg  2 mg Intravenous Q2H PRN Clarnce Flock, MD   2 mg at 12/05/20 1139   nystatin (MYCOSTATIN/NYSTOP) topical powder   Topical BID Jennye Boroughs, MD   Given at 12/07/20 0749   ondansetron (ZOFRAN) tablet 4 mg  4 mg Oral Q6H PRN Clarnce Flock, MD   4 mg at 12/05/20 0408   Or   ondansetron (ZOFRAN) injection 4 mg  4 mg Intravenous Q6H PRN Clarnce Flock, MD       polyethylene glycol (MIRALAX / GLYCOLAX) packet 17 g  17 g Oral Daily PRN Clarnce Flock, MD       Derrill Memo ON 12/08/2020] predniSONE (DELTASONE) tablet 40 mg  40 mg Oral Q breakfast Barb Merino, MD       sodium chloride flush (NS) 0.9 % injection 3 mL  3 mL Intravenous Q12H Clarnce Flock, MD   3 mL at 12/07/20 0750   traZODone (DESYREL) tablet 25 mg  25 mg Oral QHS PRN Sharion Settler, NP   25 mg at 12/06/20 2011     Discharge Medications: Please see discharge summary for a list of discharge medications.  Relevant Imaging Results:  Relevant Lab Results:   Additional Information SSN:  [id number]  Shelbie Hutching, RN

## 2020-12-08 MED ORDER — GUAIFENESIN-DM 100-10 MG/5ML PO SYRP
10.0000 mL | ORAL_SOLUTION | Freq: Four times a day (QID) | ORAL | Status: DC
  Administered 2020-12-08 – 2020-12-12 (×14): 10 mL via ORAL
  Filled 2020-12-08 (×12): qty 10

## 2020-12-08 NOTE — Evaluation (Signed)
Physical Therapy Evaluation Patient Details Name: [name] MRN: [id number] DOB: Nov 11, 1950 Today's Date: 12/08/2020  History of Present Illness  70 y.o. female with medical history significant for morbid obesity, COPD, hypertension, hyperlipidemia, PAD, hypothyroidism, anxiety with panic disorder, who presented to the hospital with shortness of breath, productive cough with increased sputum production and wheezing. She was admitted to the hospital for acute exacerbation of COPD and acute hypoxemic and hypercapnic respiratory failure.  Clinical Impression  Pt alert, sitting on BSC with nursing. Pt notes PLOF as MOD-I w/ rollator and her son provides assistance for driving. Pt agreeable to transfer to recliner but denies further mobility secondary to fatigue. Pt ambulates with RW, CGA for safety and performed x3 STS with rest breaks, CGA. Pt is limited in endurance and functional mobility necessary for returning home safely and is unable to ambulate household distances at this time. Current discharge recommendations are SNF w/ intermittent supervision. Skilled PT intervention is indicated to address deficits in function, mobility, and to return to PLOF as able.       Recommendations for follow up therapy are one component of a multi-disciplinary discharge planning process, led by the attending physician.  Recommendations may be updated based on patient status, additional functional criteria and insurance authorization.  Follow Up Recommendations Skilled nursing-short term rehab (<3 hours/day)    Assistance Recommended at Discharge Intermittent Supervision/Assistance  Functional Status Assessment Patient has had a recent decline in their functional status and demonstrates the ability to make significant improvements in function in a reasonable and predictable amount of time.  Equipment Recommendations  Other (comment) (TBD next venue of care)    Recommendations for Other Services        Precautions / Restrictions Precautions Precautions: Fall Restrictions Weight Bearing Restrictions: No      Mobility  Bed Mobility               General bed mobility comments: Pt seated on BSC beginning of session, returned to recliner    Transfers Overall transfer level: Needs assistance Equipment used: Rolling walker (2 wheels) Transfers: Sit to/from Stand Sit to Stand: Min guard           General transfer comment: STS x 3 w/ cues for hand placement x 1    Ambulation/Gait Ambulation/Gait assistance: Min guard Gait Distance (Feet): 20 Feet Assistive device: Rolling walker (2 wheels) Gait Pattern/deviations: Step-through pattern     General Gait Details: Pt limited by dyspnea, fatigue; pt able to [facility] around obstacles in room without LOB  Stairs            Wheelchair Mobility    Modified Rankin (Stroke Patients Only)       Balance Overall balance assessment: Needs assistance Sitting-balance support: Feet supported;Bilateral upper extremity supported Sitting balance-Leahy Scale: Good     Standing balance support: Bilateral upper extremity supported;During functional activity Standing balance-Leahy Scale: Fair Standing balance comment: Able to stand statically without BUE support                             Pertinent Vitals/Pain Pain Assessment: No/denies pain    Home Living Family/patient expects to be discharged to:: Private residence Living Arrangements: Alone Available Help at Discharge: Family;Available PRN/intermittently Type of Home: Apartment Home Access: Level entry       Home Layout: One level Home Equipment: Rollator (4 wheels)      Prior Function Prior Level of Function : Independent/Modified Independent  Mobility Comments: Rollator for household distances ADLs Comments: MOD I for ADLs, son brings groceries and drives her to appointments; currently does not utilize O2 at home     Hand  Dominance   Dominant Hand: Right    Extremity/Trunk Assessment   Upper Extremity Assessment Upper Extremity Assessment: Generalized weakness    Lower Extremity Assessment Lower Extremity Assessment: Generalized weakness       Communication   Communication: No difficulties  Cognition Arousal/Alertness: Awake/alert Behavior During Therapy: WFL for tasks assessed/performed Overall Cognitive Status: Within Functional Limits for tasks assessed                                 General Comments: AOx4        General Comments General comments (skin integrity, edema, etc.): 3L [place] at SpO2 > 94%    Exercises     Assessment/Plan    PT Assessment Patient needs continued PT services  PT Problem List Decreased strength;Decreased range of motion;Decreased activity tolerance;Decreased balance;Decreased mobility       PT Treatment Interventions Gait training;Stair training;Balance training;Functional mobility training;Therapeutic activities;Therapeutic exercise    PT Goals (Current goals can be found in the Care Plan section)  Acute Rehab PT Goals Patient Stated Goal: To get stronger PT Goal Formulation: With patient Time For Goal Achievement: 12/22/20 Potential to Achieve Goals: Good    Frequency Min 2X/week   Barriers to discharge        Co-evaluation               AM-PAC PT "6 Clicks" Mobility  Outcome Measure Help needed turning from your back to your side while in a flat bed without using bedrails?: None Help needed moving from lying on your back to sitting on the side of a flat bed without using bedrails?: A Little Help needed moving to and from a bed to a chair (including a wheelchair)?: A Little Help needed standing up from a chair using your arms (e.g., wheelchair or bedside chair)?: A Little Help needed to walk in hospital room?: A Lot Help needed climbing 3-5 steps with a railing? : A Lot 6 Click Score: 17    End of Session Equipment  Utilized During Treatment: Gait belt;Oxygen Activity Tolerance: Patient limited by fatigue Patient left: in chair;with call bell/phone within reach;with chair alarm set;with nursing/sitter in room Nurse Communication: Mobility status PT Visit Diagnosis: Other abnormalities of gait and mobility (R26.89);Muscle weakness (generalized) (M62.81)    Time: 9509-3267 PT Time Calculation (min) (ACUTE ONLY): 29 min   Charges:             [employer], SPT

## 2020-12-08 NOTE — TOC Progression Note (Signed)
Transition of Care [facility]) - Progression Note    Patient Details  Name: [name] MRN: [id number] Date of Birth: [birth year]  Transition of Care [facility]) CM/SW Contact  Shelbie Hutching, RN Phone Number: 12/08/2020, 1:58 PM  Clinical Narrative:    Patient chooses [facility] for short term rehab.  Magda Paganini over at [facility] does not think that they will have a bed available until Monday.  [employer] called to start insurance auth for SNF and EMS.     Expected Discharge Plan: [place] Barriers to Discharge: Continued Medical Work up  Expected Discharge Plan and Services Expected Discharge Plan: [place]   Discharge Planning Services: CM Consult Post Acute Care Choice: [employer] Living arrangements for the past 2 months: Apartment                 DME Arranged: N/A DME Agency: NA       HH Arranged: NA HH Agency: NA         Social Determinants of Health (SDOH) Interventions    Readmission Risk Interventions No flowsheet data found.

## 2020-12-08 NOTE — Progress Notes (Signed)
Occupational Therapy Treatment Patient Details Name: [name] MRN: [id number] DOB: 01-21-1951 Today's Date: 12/08/2020   History of present illness 70 y.o. female with medical history significant for morbid obesity, COPD, hypertension, hyperlipidemia, PAD, hypothyroidism, anxiety with panic disorder, who presented to the hospital with shortness of breath, productive cough with increased sputum production and wheezing. She was admitted to the hospital for acute exacerbation of COPD and acute hypoxemic and hypercapnic respiratory failure.   OT comments  Upon entering the room, pt supine in bed with no c/o pain and agreeable to OT intervention. Pt performing bed mobility with min A to EOB for trunk control. Pt requesting assistance to wash hair. Pt seated on EOB, feet unsupported and pt maintaining balance. OT assisted pt with washing hair and pt combing hair and applying lotion to B UEs and LEs without LOB. Pt does continue to fatigue quickly. OT began discussion of energy conservation with education to continue next session. Pt returning to bed and repositioning self without physical assistance. Bed alarm activated and all needs within reach.    Recommendations for follow up therapy are one component of a multi-disciplinary discharge planning process, led by the attending physician.  Recommendations may be updated based on patient status, additional functional criteria and insurance authorization.    Follow Up Recommendations  Skilled nursing-short term rehab (<3 hours/day)    Assistance Recommended at Discharge Frequent or constant Supervision/Assistance  Equipment Recommendations  BSC       Precautions / Restrictions Precautions Precautions: Fall Restrictions Weight Bearing Restrictions: No       Mobility Bed Mobility Overal bed mobility: Needs Assistance Bed Mobility: Supine to Sit;Sit to Supine     Supine to sit: Min assist Sit to supine: Supervision   General bed mobility  comments: with cuing for technique    Transfers Overall transfer level: Needs assistance Equipment used: Rolling walker (2 wheels) Transfers: Sit to/from Stand Sit to Stand: Min guard           General transfer comment: STS x 3 w/ cues for hand placement x 1     Balance Overall balance assessment: Needs assistance Sitting-balance support: Feet supported;Bilateral upper extremity supported Sitting balance-Leahy Scale: Good     Standing balance support: Bilateral upper extremity supported;During functional activity Standing balance-Leahy Scale: Fair Standing balance comment: Able to stand statically without BUE support                           ADL either performed or assessed with clinical judgement   ADL Overall ADL's : Needs assistance/impaired     Grooming: Brushing hair;Supervision/safety;Set up;Sitting Grooming Details (indicate cue type and reason): Pt sitting EOB to wash hair, comb hair, and apply lotion with set up a to obtain needed items                                     Vision Patient Visual Report: No change from baseline            Cognition Arousal/Alertness: Awake/alert Behavior During Therapy: WFL for tasks assessed/performed Overall Cognitive Status: Within Functional Limits for tasks assessed                                 General Comments: AOx4  General Comments 3L [place] at SpO2 > 94%    Pertinent Vitals/ Pain       Pain Assessment: No/denies pain  Home Living Family/patient expects to be discharged to:: Private residence Living Arrangements: Alone Available Help at Discharge: Family;Available PRN/intermittently Type of Home: Apartment Home Access: Level entry     Home Layout: One level     Bathroom Shower/Tub: Teacher, early years/pre: Standard     Home Equipment: Rollator (4 wheels)              Frequency  Min 2X/week        Progress Toward  Goals  OT Goals(current goals can now be found in the care plan section)  Progress towards OT goals: Progressing toward goals  Acute Rehab OT Goals Patient Stated Goal: to get better OT Goal Formulation: With patient Time For Goal Achievement: 12/21/20 Potential to Achieve Goals: Good  Plan Discharge plan remains appropriate;Frequency remains appropriate       AM-PAC OT "6 Clicks" Daily Activity     Outcome Measure   Help from another person eating meals?: None Help from another person taking care of personal grooming?: None Help from another person toileting, which includes using toliet, bedpan, or urinal?: A Little Help from another person bathing (including washing, rinsing, drying)?: A Little Help from another person to put on and taking off regular upper body clothing?: None Help from another person to put on and taking off regular lower body clothing?: A Little 6 Click Score: 21    End of Session Equipment Utilized During Treatment: Oxygen  OT Visit Diagnosis: Unsteadiness on feet (R26.81);Muscle weakness (generalized) (M62.81);History of falling (Z91.81)   Activity Tolerance Patient tolerated treatment well   Patient Left in bed;with call bell/phone within reach;with bed alarm set   Nurse Communication Mobility status        Time: 1561-5379 OT Time Calculation (min): 26 min  Charges: OT General Charges $OT Visit: 1 Visit OT Treatments $Self Care/Home Management : 23-37 mins  Darleen Crocker, MS, OTR/L , CBIS ascom [phone]  12/08/20, 4:28 PM

## 2020-12-08 NOTE — NC FL2 (Signed)
[place] LEVEL OF CARE SCREENING TOOL     IDENTIFICATION  Patient Name: [name] Birthdate: 26-Nov-1950 Sex: female Admission Date (Current Location): 12/02/2020  [facility] and [place] Number:  Engineering geologist and Address:  [facility], [address] [place], [place] [address]      Provider Number: [id number]  Attending Physician Name and Address:  Shelly Coss, MD  Relative Name and Phone Number:  Daylene Vandenbosch (son) [phone]    Current Level of Care: Hospital Recommended Level of Care: [place] Prior Approval Number:    Date Approved/Denied:   PASRR Number: [id number]  Discharge Plan: SNF    Current Diagnoses: Patient Active Problem List   Diagnosis Date Noted   Acute hypoxemic respiratory failure ([place]) 12/02/2020   Stage 3a chronic kidney disease ([place]) 08/01/2020   Psoriasis 07/26/2020   Class 2 severe obesity due to excess calories with serious comorbidity and body mass index (BMI) of 37.0 to 37.9 in adult [facility]) 07/26/2020   COPD with acute exacerbation ([place]) 10/28/2017   Thoracic aortic atherosclerosis ([place]) 02/12/2017   Chronic hip pain 05/07/2016   Insomnia 01/10/2016   Bilateral carotid artery stenosis 08/22/2015   Atherosclerotic peripheral vascular disease with intermittent claudication (HCC) 08/04/2015   Benign essential HTN 08/04/2015   Mixed hyperlipidemia 08/04/2015   Panic disorder without agoraphobia with moderate panic attacks 07/01/2015   Prediabetes 06/09/2015   Clinical depression 08/19/2014   Adult hypothyroidism 08/19/2014   Anxiety disorder 06/23/2014   Degeneration of intervertebral disc of lumbosacral region 03/22/2008    Orientation RESPIRATION BLADDER Height & Weight     Self, Time, Situation, Place  O2 Continent Weight: 91 kg Height:  5\' 3"  (160 cm)  BEHAVIORAL SYMPTOMS/MOOD NEUROLOGICAL BOWEL NUTRITION STATUS      Continent Diet  AMBULATORY STATUS  COMMUNICATION OF NEEDS Skin   Limited Assist Verbally Normal                       Personal Care Assistance Level of Assistance  Bathing, Feeding, Dressing Bathing Assistance: Limited assistance Feeding assistance: Independent Dressing Assistance: Limited assistance     Functional Limitations Info  Sight, Hearing, Speech Sight Info: Adequate Hearing Info: Adequate Speech Info: Adequate    SPECIAL CARE FACTORS FREQUENCY  PT (By licensed PT), OT (By licensed OT)     PT Frequency: 5 times per week OT Frequency: 5 times per week            Contractures Contractures Info: Not present    Additional Factors Info  Code Status, Allergies Code Status Info: DNR Allergies Info: Cipro, mucinex, oxycodone, oxycodone-acetaminophen, sulfa           Current Medications (12/08/2020):  This is the current hospital active medication list Current Facility-Administered Medications  Medication Dose Route Frequency Provider Last Rate Last Admin   benzonatate (TESSALON) capsule 100 mg  100 mg Oral TID PRN Jennye Boroughs, MD   100 mg at 12/07/20 2029   busPIRone (BUSPAR) tablet 7.5 mg  7.5 mg Oral TID Clarnce Flock, MD   7.5 mg at 12/08/20 0900   carvedilol (COREG) tablet 3.125 mg  3.125 mg Oral BID WC Clarnce Flock, MD   3.125 mg at 12/08/20 0900   citalopram (CELEXA) tablet 40 mg  40 mg Oral Daily Clarnce Flock, MD   40 mg at 12/08/20 0900   clonazePAM (KLONOPIN) tablet 0.5-1 mg  0.5-1 mg Oral TID PRN Dione Plover,  Annice Needy, MD   0.5 mg at 12/08/20 1211   diltiazem (CARDIZEM CD) 24 hr capsule 120 mg  120 mg Oral Daily Clarnce Flock, MD   120 mg at 12/08/20 0900   enoxaparin (LOVENOX) injection 40 mg  40 mg Subcutaneous Q24H Clarnce Flock, MD   40 mg at 12/07/20 2028   fluticasone furoate-vilanterol (BREO ELLIPTA) 200-25 MCG/ACT 1 puff  1 puff Inhalation Daily Vira Blanco, RPH   1 puff at 12/08/20 0901   guaiFENesin-dextromethorphan (ROBITUSSIN DM) 100-10 MG/5ML  syrup 10 mL  10 mL Oral Q6H Adhikari, Amrit, MD   10 mL at 12/08/20 1033   ibuprofen (ADVIL) tablet 400 mg  400 mg Oral Q6H PRN Clarnce Flock, MD   400 mg at 12/08/20 1210   ipratropium-albuterol (DUONEB) 0.5-2.5 (3) MG/3ML nebulizer solution 3 mL  3 mL Nebulization Q6H PRN Jennye Boroughs, MD   3 mL at 12/08/20 0545   ipratropium-albuterol (DUONEB) 0.5-2.5 (3) MG/3ML nebulizer solution 3 mL  3 mL Nebulization BID Jennye Boroughs, MD   3 mL at 12/08/20 0732   levothyroxine (SYNTHROID) tablet 175 mcg  175 mcg Oral Daily Clarnce Flock, MD   175 mcg at 12/08/20 0536   lisinopril (ZESTRIL) tablet 10 mg  10 mg Oral Daily Clarnce Flock, MD   10 mg at 12/08/20 0900   morphine 2 MG/ML injection 2 mg  2 mg Intravenous Q2H PRN Clarnce Flock, MD   2 mg at 12/05/20 1139   nystatin (MYCOSTATIN/NYSTOP) topical powder   Topical BID Jennye Boroughs, MD   Given at 12/08/20 0901   ondansetron (ZOFRAN) tablet 4 mg  4 mg Oral Q6H PRN Clarnce Flock, MD   4 mg at 12/05/20 0408   Or   ondansetron (ZOFRAN) injection 4 mg  4 mg Intravenous Q6H PRN Clarnce Flock, MD       polyethylene glycol (MIRALAX / GLYCOLAX) packet 17 g  17 g Oral Daily PRN Clarnce Flock, MD       predniSONE (DELTASONE) tablet 40 mg  40 mg Oral Q breakfast Barb Merino, MD   40 mg at 12/08/20 0900   sodium chloride flush (NS) 0.9 % injection 3 mL  3 mL Intravenous Q12H Clarnce Flock, MD   3 mL at 12/08/20 0901   traZODone (DESYREL) tablet 25 mg  25 mg Oral QHS PRN Sharion Settler, NP   25 mg at 12/07/20 2225     Discharge Medications: Please see discharge summary for a list of discharge medications.  Relevant Imaging Results:  Relevant Lab Results:   Additional Information SSN:  [id number]  Shelbie Hutching, RN

## 2020-12-08 NOTE — Progress Notes (Signed)
PROGRESS NOTE    [name]  XTG:[id number] DOB: [birth year] DOA: 12/02/2020 PCP: Jearld Fenton, NP   Chief Complain: Shortness of breath, wheezing  Brief Narrative: Patient is a 70 year old female with history of obesity, COPD, hypertension, hyperlipidemia, hypothyroidism, anxiety who presented with shortness of breath, cough, wheezing.  She was admitted for COPD exacerbation management.  Respiratory status has improved.  PT/OT recommending skilled nursing facility on discharge.  Waiting for bed, medically stable for discharge.  Assessment & Plan:   Principal Problem:   COPD with acute exacerbation ([place]) Active Problems:   Anxiety disorder   Adult hypothyroidism   Panic disorder without agoraphobia with moderate panic attacks   Atherosclerotic peripheral vascular disease with intermittent claudication (HCC)   Benign essential HTN   Psoriasis   Class 2 severe obesity due to excess calories with serious comorbidity and body mass index (BMI) of 37.0 to 37.9 in adult [facility])   Acute hypoxemic respiratory failure (HCC)   COPD exacerbation: Not on oxygen at home.  Hypoxic on room air with 70% saturation.  Currently on 3 L of oxygen per minute.  She might need oxygen on discharge. Steroids has been changed to oral.  Continue bronchodilator treatment.  Continue incentive spirometry.  Wheezings have resolved.  Smoker: Counseled to quit.  Nicotine patch  Debility/deconditioning :PT/OT recommending skilled nursing facility on discharge  Other chronic medical issues: anxiety/hypertension/hypothyroidism.  Stable on current home medications          DVT prophylaxis:Lovenox Code Status: DNR Family Communication: None at bedside Status is: Inpatient        Consultants: None  Procedures:None  Antimicrobials:  Anti-infectives (From admission, onward)    Start     Dose/Rate Route Frequency Ordered Stop   12/02/20 1845  cefTRIAXone (ROCEPHIN) 1 g in sodium chloride 0.9 % 100  mL IVPB        1 g 200 mL/hr over 30 Minutes Intravenous Every 24 hours 12/02/20 1838 12/06/20 1721   12/02/20 1730  azithromycin (ZITHROMAX) tablet 500 mg        500 mg Oral  Once 12/02/20 1726 12/02/20 1956       Subjective:  Patient seen and examined at the bedside this morning.  Hemodynamically stable.  She says she just could not sleep last night because of cough.  Wheezings have resolved.  Currently on 3 days of oxygen per minute   Objective: Vitals:   12/07/20 2001 12/08/20 0511 12/08/20 0748 12/08/20 0858  BP:  133/66 137/75 (!) 143/59  Pulse:  70 68 73  Resp:  19 16   Temp:  98.1 F (36.7 C) 98.1 F (36.7 C)   TempSrc:   Oral   SpO2: 92% 93% 97% 94%  Weight:      Height:        Intake/Output Summary (Last 24 hours) at 12/08/2020 0950 Last data filed at 12/08/2020 0653 Gross per 24 hour  Intake --  Output 290 ml  Net -290 ml   Filed Weights   12/02/20 1612  Weight: 91 kg    Examination:  General exam: Overall comfortable, not in distress,obese HEENT: PERRL Respiratory system: Diminished air entry bilaterally, no wheezes or crackles  Cardiovascular system: S1 & S2 heard, RRR.  Gastrointestinal system: Abdomen is nondistended, soft and nontender. Central nervous system: Alert and oriented Extremities: No edema, no clubbing ,no cyanosis Skin: No rashes, no ulcers,no icterus     Data Reviewed: I have personally reviewed following labs and imaging studies  CBC: Recent Labs  Lab 12/02/20 1612 12/03/20 0607  WBC 8.6 7.4  NEUTROABS 5.3  --   HGB 15.7* 14.6  HCT 52.4* 46.9*  MCV 95.4 93.4  PLT 225 286   Basic Metabolic Panel: Recent Labs  Lab 12/02/20 1612 12/03/20 0607  NA 141 138  K 3.5 3.6  CL 92* 93*  CO2 41* 38*  GLUCOSE 163* 171*  BUN 14 14  CREATININE 0.97 0.79  CALCIUM 8.8* 8.8*   GFR: Estimated Creatinine Clearance: 69 mL/min (by C-G formula based on SCr of 0.79 mg/dL). Liver Function Tests: Recent Labs  Lab 12/02/20 1612   AST 14*  ALT 9  ALKPHOS 90  BILITOT 0.8  PROT 6.7  ALBUMIN 3.3*   No results for input(s): LIPASE, AMYLASE in the last 168 hours. No results for input(s): AMMONIA in the last 168 hours. Coagulation Profile: No results for input(s): INR, PROTIME in the last 168 hours. Cardiac Enzymes: No results for input(s): CKTOTAL, CKMB, CKMBINDEX, TROPONINI in the last 168 hours. BNP (last 3 results) No results for input(s): PROBNP in the last 8760 hours. HbA1C: No results for input(s): HGBA1C in the last 72 hours. CBG: No results for input(s): GLUCAP in the last 168 hours. Lipid Profile: No results for input(s): CHOL, HDL, LDLCALC, TRIG, CHOLHDL, LDLDIRECT in the last 72 hours. Thyroid Function Tests: No results for input(s): TSH, T4TOTAL, FREET4, T3FREE, THYROIDAB in the last 72 hours. Anemia Panel: No results for input(s): VITAMINB12, FOLATE, FERRITIN, TIBC, IRON, RETICCTPCT in the last 72 hours. Sepsis Labs: No results for input(s): PROCALCITON, LATICACIDVEN in the last 168 hours.  Recent Results (from the past 240 hour(s))  Resp Panel by RT-PCR (Flu A&B, Covid) Nasopharyngeal Swab     Status: None   Collection Time: 12/02/20  4:14 PM   Specimen: Nasopharyngeal Swab; Nasopharyngeal(NP) swabs in vial transport medium  Result Value Ref Range Status   SARS Coronavirus 2 by RT PCR NEGATIVE NEGATIVE Final    Comment: (NOTE) SARS-CoV-2 target nucleic acids are NOT DETECTED.  The SARS-CoV-2 RNA is generally detectable in upper respiratory specimens during the acute phase of infection. The lowest concentration of SARS-CoV-2 viral copies this assay can detect is 138 copies/mL. A negative result does not preclude SARS-Cov-2 infection and should not be used as the sole basis for treatment or other patient management decisions. A negative result may occur with  improper specimen collection/handling, submission of specimen other than nasopharyngeal swab, presence of viral mutation(s) within  the areas targeted by this assay, and inadequate number of viral copies(<138 copies/mL). A negative result must be combined with clinical observations, patient history, and epidemiological information. The expected result is Negative.  Fact Sheet for Patients:  EntrepreneurPulse.com.au  Fact Sheet for Healthcare Providers:  IncredibleEmployment.be  This test is no t yet approved or cleared by the [place] FDA and  has been authorized for detection and/or diagnosis of SARS-CoV-2 by FDA under an Emergency Use Authorization (EUA). This EUA will remain  in effect (meaning this test can be used) for the duration of the COVID-19 declaration under Section 564(b)(1) of the Act, 21 U.S.C.section 360bbb-3(b)(1), unless the authorization is terminated  or revoked sooner.       Influenza A by PCR NEGATIVE NEGATIVE Final   Influenza B by PCR NEGATIVE NEGATIVE Final    Comment: (NOTE) The Xpert Xpress SARS-CoV-2/FLU/RSV plus assay is intended as an aid in the diagnosis of influenza from Nasopharyngeal swab specimens and should not be used as a sole  basis for treatment. Nasal washings and aspirates are unacceptable for Xpert Xpress SARS-CoV-2/FLU/RSV testing.  Fact Sheet for Patients: EntrepreneurPulse.com.au  Fact Sheet for Healthcare Providers: IncredibleEmployment.be  This test is not yet approved or cleared by the [place] FDA and has been authorized for detection and/or diagnosis of SARS-CoV-2 by FDA under an Emergency Use Authorization (EUA). This EUA will remain in effect (meaning this test can be used) for the duration of the COVID-19 declaration under Section 564(b)(1) of the Act, 21 U.S.C. section 360bbb-3(b)(1), unless the authorization is terminated or revoked.  Performed at [facility], [address] [place], [place] [address]   Respiratory (~20 pathogens) panel by PCR      Status: None   Collection Time: 12/02/20  9:22 PM   Specimen: Nasopharyngeal Swab; Respiratory  Result Value Ref Range Status   Adenovirus NOT DETECTED NOT DETECTED Final   Coronavirus 229E NOT DETECTED NOT DETECTED Final    Comment: (NOTE) The Coronavirus on the Respiratory Panel, DOES NOT test for the novel  Coronavirus (2019 nCoV)    Coronavirus HKU1 NOT DETECTED NOT DETECTED Final   Coronavirus NL63 NOT DETECTED NOT DETECTED Final   Coronavirus OC43 NOT DETECTED NOT DETECTED Final   Metapneumovirus NOT DETECTED NOT DETECTED Final   Rhinovirus / Enterovirus NOT DETECTED NOT DETECTED Final   Influenza A NOT DETECTED NOT DETECTED Final   Influenza B NOT DETECTED NOT DETECTED Final   Parainfluenza Virus 1 NOT DETECTED NOT DETECTED Final   Parainfluenza Virus 2 NOT DETECTED NOT DETECTED Final   Parainfluenza Virus 3 NOT DETECTED NOT DETECTED Final   Parainfluenza Virus 4 NOT DETECTED NOT DETECTED Final   Respiratory Syncytial Virus NOT DETECTED NOT DETECTED Final   Bordetella pertussis NOT DETECTED NOT DETECTED Final   Bordetella Parapertussis NOT DETECTED NOT DETECTED Final   Chlamydophila pneumoniae NOT DETECTED NOT DETECTED Final   Mycoplasma pneumoniae NOT DETECTED NOT DETECTED Final    Comment: Performed at [facility], [place]. [address], [place], [place] [address]         Radiology Studies: CT Angio Chest Pulmonary Embolism (PE) W or WO Contrast  Result Date: 12/07/2020 CLINICAL DATA:  PE suspected, shortness of breath, productive cough EXAM: CT ANGIOGRAPHY CHEST WITH CONTRAST TECHNIQUE: Multidetector CT imaging of the chest was performed using the standard protocol during bolus administration of intravenous contrast. Multiplanar CT image reconstructions and MIPs were obtained to evaluate the vascular anatomy. CONTRAST:  130mL OMNIPAQUE IOHEXOL 350 MG/ML SOLN COMPARISON:  None. FINDINGS: Cardiovascular: Satisfactory opacification of the pulmonary arteries to the segmental  level. No evidence of pulmonary embolism. Normal heart size. Three-vessel coronary artery calcifications. No pericardial effusion. Aortic atherosclerosis. Mediastinum/Nodes: No enlarged mediastinal, hilar, or axillary lymph nodes. Thyroid gland, trachea, and esophagus demonstrate no significant findings. Lungs/Pleura: Mild-to-moderate centrilobular and paraseptal emphysema. Diffuse bilateral bronchial wall thickening. Small bilateral pleural effusions and associated atelectasis or consolidation. Upper Abdomen: No acute abnormality. Somewhat coarse, nodular contour of the liver in the included upper abdomen. Musculoskeletal: No chest wall abnormality. No acute or significant osseous findings. Review of the MIP images confirms the above findings. IMPRESSION: 1. Negative examination for pulmonary embolism. 2. Small bilateral pleural effusions and associated atelectasis or consolidation. 3. Emphysema and diffuse bilateral bronchial wall thickening. 4. Coronary artery disease. 5. Somewhat coarse, nodular contour of the liver in the included upper abdomen, suggestive of cirrhosis. Aortic Atherosclerosis (ICD10-I70.0) and Emphysema (ICD10-J43.9). Electronically Signed   By: Delanna Ahmadi M.D.   On: 12/07/2020  14:26   DG Chest Port 1 View  Result Date: 12/06/2020 CLINICAL DATA:  Short of breath.  History of COPD and hypertension. EXAM: PORTABLE CHEST 1 VIEW COMPARISON:  12/02/2020. FINDINGS: Mild opacity noted at both lung bases, increased from the prior study, consistent with atelectasis. Remainder of the lungs is clear. No definite pleural effusion.  No evidence of a pneumothorax. Cardiac silhouette is normal in size. IMPRESSION: 1. Mild lung base opacities developing since the prior study consistent with atelectasis. No other change. No convincing pneumonia and no evidence of pulmonary edema. Electronically Signed   By: Lajean Manes M.D.   On: 12/06/2020 14:00        Scheduled Meds:  busPIRone  7.5 mg Oral  TID   carvedilol  3.125 mg Oral BID WC   citalopram  40 mg Oral Daily   diltiazem  120 mg Oral Daily   enoxaparin (LOVENOX) injection  40 mg Subcutaneous Q24H   fluticasone furoate-vilanterol  1 puff Inhalation Daily   ipratropium-albuterol  3 mL Nebulization BID   levothyroxine  175 mcg Oral Daily   lisinopril  10 mg Oral Daily   nystatin   Topical BID   predniSONE  40 mg Oral Q breakfast   sodium chloride flush  3 mL Intravenous Q12H   Continuous Infusions:   LOS: 6 days    Time spent: 25 mins.More than 50% of that time was spent in counseling and/or coordination of care.      Shelly Coss, MD [facility] P11/04/2020, 9:50 AM

## 2020-12-09 LAB — CREATININE, SERUM
Creatinine, Ser: 0.62 mg/dL (ref 0.44–1.00)
GFR, Estimated: >60 mL/min (ref >60)

## 2020-12-09 MED ORDER — POLYETHYLENE GLYCOL 3350 17 G PO PACK
17.0000 g | PACK | Freq: Every day | ORAL | Status: DC
  Administered 2020-12-09: 15:00:00 17 g via ORAL
  Filled 2020-12-09 (×3): qty 1

## 2020-12-09 MED ORDER — SENNOSIDES-DOCUSATE SODIUM 8.6-50 MG PO TABS: 1.0000 | ORAL_TABLET | Freq: Two times a day (BID) | ORAL | Status: DC | PRN

## 2020-12-09 MED ORDER — ZOLPIDEM TARTRATE 5 MG PO TABS
5.0000 mg | ORAL_TABLET | Freq: Every evening | ORAL | Status: DC | PRN
  Administered 2020-12-09 – 2020-12-11 (×3): 5 mg via ORAL
  Filled 2020-12-09 (×3): qty 1

## 2020-12-09 NOTE — Progress Notes (Signed)
Pt states she does not want to take the trazodone she has ordered prn for sleep anymore. She states it causes her to hear and see things that are not there.

## 2020-12-09 NOTE — Progress Notes (Signed)
PROGRESS NOTE    TAMURA LASKY  VZC:[id number] DOB: [birth year] DOA: 12/02/2020 PCP: Jearld Fenton, NP   Chief Complain: Shortness of breath, wheezing  Brief Narrative: Patient is a 70 year old female with history of obesity, COPD, hypertension, hyperlipidemia, hypothyroidism, anxiety who presented with shortness of breath, cough, wheezing.  She was admitted for COPD exacerbation management.  Respiratory status has improved.  PT/OT recommending skilled nursing facility on discharge.  Waiting for bed, medically stable for discharge.  Assessment & Plan:   Principal Problem:   COPD with acute exacerbation ([place]) Active Problems:   Anxiety disorder   Adult hypothyroidism   Panic disorder without agoraphobia with moderate panic attacks   Atherosclerotic peripheral vascular disease with intermittent claudication (HCC)   Benign essential HTN   Psoriasis   Class 2 severe obesity due to excess calories with serious comorbidity and body mass index (BMI) of 37.0 to 37.9 in adult [facility])   Acute hypoxemic respiratory failure (HCC)   COPD exacerbation: Not on oxygen at home.  Hypoxic on room air with 70% saturation on presentation  Currently on 2-3 L of oxygen per minute.  She might need oxygen on discharge. Steroids has been changed to oral.  Continue bronchodilator treatment.  Continue incentive spirometry.  Wheezings have resolved.  Smoker: Counseled to quit.  Nicotine patch  Debility/deconditioning :PT/OT recommending skilled nursing facility on discharge  Other chronic medical issues: anxiety/hypertension/hypothyroidism.  Stable on current home medications  Insomnia: Trazodone not helping.  We can try Ambien 5 mg nightly as needed.          DVT prophylaxis:Lovenox Code Status: DNR Family Communication: None at bedside Status is: Inpatient        Consultants: None  Procedures:None  Antimicrobials:  Anti-infectives (From admission, onward)    Start     Dose/Rate Route  Frequency Ordered Stop   12/02/20 1845  cefTRIAXone (ROCEPHIN) 1 g in sodium chloride 0.9 % 100 mL IVPB        1 g 200 mL/hr over 30 Minutes Intravenous Every 24 hours 12/02/20 1838 12/06/20 1721   12/02/20 1730  azithromycin (ZITHROMAX) tablet 500 mg        500 mg Oral  Once 12/02/20 1726 12/02/20 1956       Subjective:  Patient seen and examined the bedside this morning.  Hemodynamically stable.  Comfortable.  She states she slept little bit last night but is still struggling with sleep.  No wheezing auscultated on examination.  Has some cough   Objective: Vitals:   12/09/20 0602 12/09/20 0712 12/09/20 0731 12/09/20 1120  BP: (!) 147/74  137/65 (!) 142/73  Pulse: 65  69 72  Resp: 16  18 16   Temp: 97.9 F (36.6 C)  (!) 97.5 F (36.4 C) 98.2 F (36.8 C)  TempSrc:   Oral Oral  SpO2: 98% 93% 99% 96%  Weight:      Height:       No intake or output data in the 24 hours ending 12/09/20 1137  Filed Weights   12/02/20 1612  Weight: 91 kg    Examination:  General exam: Overall comfortable, not in distress HEENT: PERRL Respiratory system: Bilateral diminished air sounds, no wheezes or crackles  Cardiovascular system: S1 & S2 heard, RRR.  Gastrointestinal system: Abdomen is nondistended, soft and nontender. Central nervous system: Alert and oriented Extremities: No edema, no clubbing ,no cyanosis Skin: No rashes, no ulcers,no icterus     Data Reviewed: I have personally reviewed following labs and  imaging studies  CBC: Recent Labs  Lab 12/02/20 1612 12/03/20 0607  WBC 8.6 7.4  NEUTROABS 5.3  --   HGB 15.7* 14.6  HCT 52.4* 46.9*  MCV 95.4 93.4  PLT 225 308   Basic Metabolic Panel: Recent Labs  Lab 12/02/20 1612 12/03/20 0607 12/09/20 0701  NA 141 138  --   K 3.5 3.6  --   CL 92* 93*  --   CO2 41* 38*  --   GLUCOSE 163* 171*  --   BUN 14 14  --   CREATININE 0.97 0.79 0.62  CALCIUM 8.8* 8.8*  --    GFR: Estimated Creatinine Clearance: 69 mL/min (by  C-G formula based on SCr of 0.62 mg/dL). Liver Function Tests: Recent Labs  Lab 12/02/20 1612  AST 14*  ALT 9  ALKPHOS 90  BILITOT 0.8  PROT 6.7  ALBUMIN 3.3*   No results for input(s): LIPASE, AMYLASE in the last 168 hours. No results for input(s): AMMONIA in the last 168 hours. Coagulation Profile: No results for input(s): INR, PROTIME in the last 168 hours. Cardiac Enzymes: No results for input(s): CKTOTAL, CKMB, CKMBINDEX, TROPONINI in the last 168 hours. BNP (last 3 results) No results for input(s): PROBNP in the last 8760 hours. HbA1C: No results for input(s): HGBA1C in the last 72 hours. CBG: No results for input(s): GLUCAP in the last 168 hours. Lipid Profile: No results for input(s): CHOL, HDL, LDLCALC, TRIG, CHOLHDL, LDLDIRECT in the last 72 hours. Thyroid Function Tests: No results for input(s): TSH, T4TOTAL, FREET4, T3FREE, THYROIDAB in the last 72 hours. Anemia Panel: No results for input(s): VITAMINB12, FOLATE, FERRITIN, TIBC, IRON, RETICCTPCT in the last 72 hours. Sepsis Labs: No results for input(s): PROCALCITON, LATICACIDVEN in the last 168 hours.  Recent Results (from the past 240 hour(s))  Resp Panel by RT-PCR (Flu A&B, Covid) Nasopharyngeal Swab     Status: None   Collection Time: 12/02/20  4:14 PM   Specimen: Nasopharyngeal Swab; Nasopharyngeal(NP) swabs in vial transport medium  Result Value Ref Range Status   SARS Coronavirus 2 by RT PCR NEGATIVE NEGATIVE Final    Comment: (NOTE) SARS-CoV-2 target nucleic acids are NOT DETECTED.  The SARS-CoV-2 RNA is generally detectable in upper respiratory specimens during the acute phase of infection. The lowest concentration of SARS-CoV-2 viral copies this assay can detect is 138 copies/mL. A negative result does not preclude SARS-Cov-2 infection and should not be used as the sole basis for treatment or other patient management decisions. A negative result may occur with  improper specimen  collection/handling, submission of specimen other than nasopharyngeal swab, presence of viral mutation(s) within the areas targeted by this assay, and inadequate number of viral copies(<138 copies/mL). A negative result must be combined with clinical observations, patient history, and epidemiological information. The expected result is Negative.  Fact Sheet for Patients:  EntrepreneurPulse.com.au  Fact Sheet for Healthcare Providers:  IncredibleEmployment.be  This test is no t yet approved or cleared by the [place] FDA and  has been authorized for detection and/or diagnosis of SARS-CoV-2 by FDA under an Emergency Use Authorization (EUA). This EUA will remain  in effect (meaning this test can be used) for the duration of the COVID-19 declaration under Section 564(b)(1) of the Act, 21 U.S.C.section 360bbb-3(b)(1), unless the authorization is terminated  or revoked sooner.       Influenza A by PCR NEGATIVE NEGATIVE Final   Influenza B by PCR NEGATIVE NEGATIVE Final    Comment: (NOTE) The  Xpert Xpress SARS-CoV-2/FLU/RSV plus assay is intended as an aid in the diagnosis of influenza from Nasopharyngeal swab specimens and should not be used as a sole basis for treatment. Nasal washings and aspirates are unacceptable for Xpert Xpress SARS-CoV-2/FLU/RSV testing.  Fact Sheet for Patients: EntrepreneurPulse.com.au  Fact Sheet for Healthcare Providers: IncredibleEmployment.be  This test is not yet approved or cleared by the [place] FDA and has been authorized for detection and/or diagnosis of SARS-CoV-2 by FDA under an Emergency Use Authorization (EUA). This EUA will remain in effect (meaning this test can be used) for the duration of the COVID-19 declaration under Section 564(b)(1) of the Act, 21 U.S.C. section 360bbb-3(b)(1), unless the authorization is terminated or revoked.  Performed at [facility], [address] [place], [place] [address]   Respiratory (~20 pathogens) panel by PCR     Status: None   Collection Time: 12/02/20  9:22 PM   Specimen: Nasopharyngeal Swab; Respiratory  Result Value Ref Range Status   Adenovirus NOT DETECTED NOT DETECTED Final   Coronavirus 229E NOT DETECTED NOT DETECTED Final    Comment: (NOTE) The Coronavirus on the Respiratory Panel, DOES NOT test for the novel  Coronavirus (2019 nCoV)    Coronavirus HKU1 NOT DETECTED NOT DETECTED Final   Coronavirus NL63 NOT DETECTED NOT DETECTED Final   Coronavirus OC43 NOT DETECTED NOT DETECTED Final   Metapneumovirus NOT DETECTED NOT DETECTED Final   Rhinovirus / Enterovirus NOT DETECTED NOT DETECTED Final   Influenza A NOT DETECTED NOT DETECTED Final   Influenza B NOT DETECTED NOT DETECTED Final   Parainfluenza Virus 1 NOT DETECTED NOT DETECTED Final   Parainfluenza Virus 2 NOT DETECTED NOT DETECTED Final   Parainfluenza Virus 3 NOT DETECTED NOT DETECTED Final   Parainfluenza Virus 4 NOT DETECTED NOT DETECTED Final   Respiratory Syncytial Virus NOT DETECTED NOT DETECTED Final   Bordetella pertussis NOT DETECTED NOT DETECTED Final   Bordetella Parapertussis NOT DETECTED NOT DETECTED Final   Chlamydophila pneumoniae NOT DETECTED NOT DETECTED Final   Mycoplasma pneumoniae NOT DETECTED NOT DETECTED Final    Comment: Performed at [facility], [place]. [address], [place], [place] [address]         Radiology Studies: CT Angio Chest Pulmonary Embolism (PE) W or WO Contrast  Result Date: 12/07/2020 CLINICAL DATA:  PE suspected, shortness of breath, productive cough EXAM: CT ANGIOGRAPHY CHEST WITH CONTRAST TECHNIQUE: Multidetector CT imaging of the chest was performed using the standard protocol during bolus administration of intravenous contrast. Multiplanar CT image reconstructions and MIPs were obtained to evaluate the vascular anatomy. CONTRAST:  139mL OMNIPAQUE IOHEXOL 350 MG/ML SOLN  COMPARISON:  None. FINDINGS: Cardiovascular: Satisfactory opacification of the pulmonary arteries to the segmental level. No evidence of pulmonary embolism. Normal heart size. Three-vessel coronary artery calcifications. No pericardial effusion. Aortic atherosclerosis. Mediastinum/Nodes: No enlarged mediastinal, hilar, or axillary lymph nodes. Thyroid gland, trachea, and esophagus demonstrate no significant findings. Lungs/Pleura: Mild-to-moderate centrilobular and paraseptal emphysema. Diffuse bilateral bronchial wall thickening. Small bilateral pleural effusions and associated atelectasis or consolidation. Upper Abdomen: No acute abnormality. Somewhat coarse, nodular contour of the liver in the included upper abdomen. Musculoskeletal: No chest wall abnormality. No acute or significant osseous findings. Review of the MIP images confirms the above findings. IMPRESSION: 1. Negative examination for pulmonary embolism. 2. Small bilateral pleural effusions and associated atelectasis or consolidation. 3. Emphysema and diffuse bilateral bronchial wall thickening. 4. Coronary artery disease. 5. Somewhat coarse, nodular contour of the liver  in the included upper abdomen, suggestive of cirrhosis. Aortic Atherosclerosis (ICD10-I70.0) and Emphysema (ICD10-J43.9). Electronically Signed   By: Delanna Ahmadi M.D.   On: 12/07/2020 14:26        Scheduled Meds:  busPIRone  7.5 mg Oral TID   carvedilol  3.125 mg Oral BID WC   citalopram  40 mg Oral Daily   diltiazem  120 mg Oral Daily   enoxaparin (LOVENOX) injection  40 mg Subcutaneous Q24H   fluticasone furoate-vilanterol  1 puff Inhalation Daily   guaiFENesin-dextromethorphan  10 mL Oral Q6H   ipratropium-albuterol  3 mL Nebulization BID   levothyroxine  175 mcg Oral Daily   lisinopril  10 mg Oral Daily   nystatin   Topical BID   predniSONE  40 mg Oral Q breakfast   sodium chloride flush  3 mL Intravenous Q12H   Continuous Infusions:   LOS: 7 days    Time  spent: 25 mins.More than 50% of that time was spent in counseling and/or coordination of care.      Shelly Coss, MD [facility] P11/05/2020, 11:37 AM

## 2020-12-09 NOTE — TOC Progression Note (Signed)
Transition of Care [facility]) - Progression Note    Patient Details  Name: [name] MRN: [id number] Date of Birth: December 08, 1950  Transition of Care [facility]) CM/SW Contact  Shelbie Hutching, RN Phone Number: 12/09/2020, 2:39 PM  Clinical Narrative:    Insurance authorization for SNF approved, approval # [id number].  Authorization for EMS has been denied, patient will need alternative transportation to facility.   Expected Discharge Plan: [place] Barriers to Discharge: Continued Medical Work up  Expected Discharge Plan and Services Expected Discharge Plan: [place]   Discharge Planning Services: CM Consult Post Acute Care Choice: [place] Living arrangements for the past 2 months: Apartment                 DME Arranged: N/A DME Agency: NA       HH Arranged: NA HH Agency: NA         Social Determinants of Health (SDOH) Interventions    Readmission Risk Interventions No flowsheet data found.

## 2020-12-10 NOTE — Progress Notes (Signed)
PROGRESS NOTE    [name]  SWN:[id number] DOB: August 29, 1950 DOA: 12/02/2020 PCP: Jearld Fenton, NP   Chief Complain: Shortness of breath, wheezing  Brief Narrative: Patient is a 70 year old female with history of obesity, COPD, hypertension, hyperlipidemia, hypothyroidism, anxiety who presented with shortness of breath, cough, wheezing.  She was admitted for COPD exacerbation management.  Respiratory status has improved.  PT/OT recommending skilled nursing facility on discharge.  Waiting for bed, medically stable for discharge.  Assessment & Plan:   Principal Problem:   COPD with acute exacerbation ([place]) Active Problems:   Anxiety disorder   Adult hypothyroidism   Panic disorder without agoraphobia with moderate panic attacks   Atherosclerotic peripheral vascular disease with intermittent claudication (HCC)   Benign essential HTN   Psoriasis   Class 2 severe obesity due to excess calories with serious comorbidity and body mass index (BMI) of 37.0 to 37.9 in adult [facility])   Acute hypoxemic respiratory failure (HCC)   COPD exacerbation: Not on oxygen at home.  Hypoxic on room air with 70% saturation on presentation  Currently on 2-3 L of oxygen per minute.  She might need oxygen on discharge.We are trying to wean the oxygen. Steroids has been changed to oral.  Continue bronchodilator treatment.  Continue incentive spirometry.  Wheezings have resolved.  Smoker: Counseled to quit.  Nicotine patch  Debility/deconditioning :PT/OT recommending skilled nursing facility on discharge  Other chronic medical issues: anxiety/hypertension/hypothyroidism.  Stable on current home medications  Insomnia: Trazodone not helping.  Ordered Ambien 5 mg nightly as needed.          DVT prophylaxis:Lovenox Code Status: DNR Family Communication: None at bedside Status is: Inpatient        Consultants: None  Procedures:None  Antimicrobials:  Anti-infectives (From admission, onward)     Start     Dose/Rate Route Frequency Ordered Stop   12/02/20 1845  cefTRIAXone (ROCEPHIN) 1 g in sodium chloride 0.9 % 100 mL IVPB        1 g 200 mL/hr over 30 Minutes Intravenous Every 24 hours 12/02/20 1838 12/06/20 1721   12/02/20 1730  azithromycin (ZITHROMAX) tablet 500 mg        500 mg Oral  Once 12/02/20 1726 12/02/20 1956       Subjective:  Patient seen and examined at the bedside this morning.  Hemodynamically stable.  Overall comfortable.  She had a good sleep last night.  She still has some dry cough   Objective: Vitals:   12/09/20 1120 12/09/20 1556 12/09/20 2029 12/10/20 0912  BP: (!) 142/73 (!) 158/86 127/60 131/64  Pulse: 72 69 64 61  Resp: 16 18 16 14   Temp: 98.2 F (36.8 C) 98.5 F (36.9 C) 97.9 F (36.6 C) 98 F (36.7 C)  TempSrc: Oral Oral    SpO2: 96% 97% 96% 94%  Weight:      Height:        Intake/Output Summary (Last 24 hours) at 12/10/2020 1109 Last data filed at 12/10/2020 1038 Gross per 24 hour  Intake 240 ml  Output 800 ml  Net -560 ml    Filed Weights   12/02/20 1612  Weight: 91 kg    Examination:  General exam: Overall comfortable, not in distress, pleasant female HEENT: PERRL Respiratory system:  no wheezes or crackles , diminished air sounds bilaterally Cardiovascular system: S1 & S2 heard, RRR.  Gastrointestinal system: Abdomen is nondistended, soft and nontender. Central nervous system: Alert and oriented Extremities: No edema, no  clubbing ,no cyanosis Skin: No rashes, no ulcers,no icterus       Data Reviewed: I have personally reviewed following labs and imaging studies  CBC: No results for input(s): WBC, NEUTROABS, HGB, HCT, MCV, PLT in the last 168 hours.  Basic Metabolic Panel: Recent Labs  Lab 12/09/20 0701  CREATININE 0.62   GFR: Estimated Creatinine Clearance: 69 mL/min (by C-G formula based on SCr of 0.62 mg/dL). Liver Function Tests: No results for input(s): AST, ALT, ALKPHOS, BILITOT, PROT, ALBUMIN in  the last 168 hours.  No results for input(s): LIPASE, AMYLASE in the last 168 hours. No results for input(s): AMMONIA in the last 168 hours. Coagulation Profile: No results for input(s): INR, PROTIME in the last 168 hours. Cardiac Enzymes: No results for input(s): CKTOTAL, CKMB, CKMBINDEX, TROPONINI in the last 168 hours. BNP (last 3 results) No results for input(s): PROBNP in the last 8760 hours. HbA1C: No results for input(s): HGBA1C in the last 72 hours. CBG: No results for input(s): GLUCAP in the last 168 hours. Lipid Profile: No results for input(s): CHOL, HDL, LDLCALC, TRIG, CHOLHDL, LDLDIRECT in the last 72 hours. Thyroid Function Tests: No results for input(s): TSH, T4TOTAL, FREET4, T3FREE, THYROIDAB in the last 72 hours. Anemia Panel: No results for input(s): VITAMINB12, FOLATE, FERRITIN, TIBC, IRON, RETICCTPCT in the last 72 hours. Sepsis Labs: No results for input(s): PROCALCITON, LATICACIDVEN in the last 168 hours.  Recent Results (from the past 240 hour(s))  Resp Panel by RT-PCR (Flu A&B, Covid) Nasopharyngeal Swab     Status: None   Collection Time: 12/02/20  4:14 PM   Specimen: Nasopharyngeal Swab; Nasopharyngeal(NP) swabs in vial transport medium  Result Value Ref Range Status   SARS Coronavirus 2 by RT PCR NEGATIVE NEGATIVE Final    Comment: (NOTE) SARS-CoV-2 target nucleic acids are NOT DETECTED.  The SARS-CoV-2 RNA is generally detectable in upper respiratory specimens during the acute phase of infection. The lowest concentration of SARS-CoV-2 viral copies this assay can detect is 138 copies/mL. A negative result does not preclude SARS-Cov-2 infection and should not be used as the sole basis for treatment or other patient management decisions. A negative result may occur with  improper specimen collection/handling, submission of specimen other than nasopharyngeal swab, presence of viral mutation(s) within the areas targeted by this assay, and inadequate  number of viral copies(<138 copies/mL). A negative result must be combined with clinical observations, patient history, and epidemiological information. The expected result is Negative.  Fact Sheet for Patients:  EntrepreneurPulse.com.au  Fact Sheet for Healthcare Providers:  IncredibleEmployment.be  This test is no t yet approved or cleared by the [place] FDA and  has been authorized for detection and/or diagnosis of SARS-CoV-2 by FDA under an Emergency Use Authorization (EUA). This EUA will remain  in effect (meaning this test can be used) for the duration of the COVID-19 declaration under Section 564(b)(1) of the Act, 21 U.S.C.section 360bbb-3(b)(1), unless the authorization is terminated  or revoked sooner.       Influenza A by PCR NEGATIVE NEGATIVE Final   Influenza B by PCR NEGATIVE NEGATIVE Final    Comment: (NOTE) The Xpert Xpress SARS-CoV-2/FLU/RSV plus assay is intended as an aid in the diagnosis of influenza from Nasopharyngeal swab specimens and should not be used as a sole basis for treatment. Nasal washings and aspirates are unacceptable for Xpert Xpress SARS-CoV-2/FLU/RSV testing.  Fact Sheet for Patients: EntrepreneurPulse.com.au  Fact Sheet for Healthcare Providers: IncredibleEmployment.be  This test is not yet approved  or cleared by the [place] and has been authorized for detection and/or diagnosis of SARS-CoV-2 by FDA under an Emergency Use Authorization (EUA). This EUA will remain in effect (meaning this test can be used) for the duration of the COVID-19 declaration under Section 564(b)(1) of the Act, 21 U.S.C. section 360bbb-3(b)(1), unless the authorization is terminated or revoked.  Performed at [facility], [address] [place], [place] [address]   Respiratory (~20 pathogens) panel by PCR     Status: None   Collection Time: 12/02/20  9:22 PM    Specimen: Nasopharyngeal Swab; Respiratory  Result Value Ref Range Status   Adenovirus NOT DETECTED NOT DETECTED Final   Coronavirus 229E NOT DETECTED NOT DETECTED Final    Comment: (NOTE) The Coronavirus on the Respiratory Panel, DOES NOT test for the novel  Coronavirus (2019 nCoV)    Coronavirus HKU1 NOT DETECTED NOT DETECTED Final   Coronavirus NL63 NOT DETECTED NOT DETECTED Final   Coronavirus OC43 NOT DETECTED NOT DETECTED Final   Metapneumovirus NOT DETECTED NOT DETECTED Final   Rhinovirus / Enterovirus NOT DETECTED NOT DETECTED Final   Influenza A NOT DETECTED NOT DETECTED Final   Influenza B NOT DETECTED NOT DETECTED Final   Parainfluenza Virus 1 NOT DETECTED NOT DETECTED Final   Parainfluenza Virus 2 NOT DETECTED NOT DETECTED Final   Parainfluenza Virus 3 NOT DETECTED NOT DETECTED Final   Parainfluenza Virus 4 NOT DETECTED NOT DETECTED Final   Respiratory Syncytial Virus NOT DETECTED NOT DETECTED Final   Bordetella pertussis NOT DETECTED NOT DETECTED Final   Bordetella Parapertussis NOT DETECTED NOT DETECTED Final   Chlamydophila pneumoniae NOT DETECTED NOT DETECTED Final   Mycoplasma pneumoniae NOT DETECTED NOT DETECTED Final    Comment: Performed at [facility], [place]. [address], [place], [place] [address]         Radiology Studies: No results found.      Scheduled Meds:  busPIRone  7.5 mg Oral TID   carvedilol  3.125 mg Oral BID WC   citalopram  40 mg Oral Daily   diltiazem  120 mg Oral Daily   enoxaparin (LOVENOX) injection  40 mg Subcutaneous Q24H   fluticasone furoate-vilanterol  1 puff Inhalation Daily   guaiFENesin-dextromethorphan  10 mL Oral Q6H   ipratropium-albuterol  3 mL Nebulization BID   levothyroxine  175 mcg Oral Daily   lisinopril  10 mg Oral Daily   polyethylene glycol  17 g Oral Daily   predniSONE  40 mg Oral Q breakfast   sodium chloride flush  3 mL Intravenous Q12H   Continuous Infusions:   LOS: 8 days    Time spent: 25  mins.More than 50% of that time was spent in counseling and/or coordination of care.      Shelly Coss, MD [facility] P11/06/2020, 11:09 AM

## 2020-12-11 NOTE — Progress Notes (Signed)
PROGRESS NOTE    VESNA KABLE  HKV:[id number] DOB: [birth year] DOA: 12/02/2020 PCP: Jearld Fenton, NP   Chief Complain: Shortness of breath, wheezing  Brief Narrative: Patient is a 70 year old female with history of obesity, COPD, hypertension, hyperlipidemia, hypothyroidism, anxiety who presented with shortness of breath, cough, wheezing.  She was admitted for COPD exacerbation management.  Respiratory status has improved.  PT/OT recommending skilled nursing facility on discharge.  Waiting for bed, medically stable for discharge.  Assessment & Plan:   Principal Problem:   COPD with acute exacerbation ([place]) Active Problems:   Anxiety disorder   Adult hypothyroidism   Panic disorder without agoraphobia with moderate panic attacks   Atherosclerotic peripheral vascular disease with intermittent claudication (HCC)   Benign essential HTN   Psoriasis   Class 2 severe obesity due to excess calories with serious comorbidity and body mass index (BMI) of 37.0 to 37.9 in adult [facility])   Acute hypoxemic respiratory failure (HCC)   COPD exacerbation: Not on oxygen at home.  Hypoxic on room air with 70% saturation on presentation  Currently on 2-3 L of oxygen per minute.  She might need oxygen on discharge.We are trying to wean the oxygen. Steroids has been changed to oral,she finished the course.  Continue bronchodilator treatment.  Continue incentive spirometry.  Wheezings have resolved.  Smoker: Counseled to quit.  Nicotine patch  Debility/deconditioning :PT/OT recommending skilled nursing facility on discharge  Other chronic medical issues: anxiety/hypertension/hypothyroidism.  Stable on current home medications  Insomnia: Trazodone not helping.  Ordered Ambien 5 mg nightly as needed.          DVT prophylaxis:Lovenox Code Status: DNR Family Communication: None at bedside Status is: Inpatient        Consultants: None  Procedures:None  Antimicrobials:  Anti-infectives  (From admission, onward)    Start     Dose/Rate Route Frequency Ordered Stop   12/02/20 1845  cefTRIAXone (ROCEPHIN) 1 g in sodium chloride 0.9 % 100 mL IVPB        1 g 200 mL/hr over 30 Minutes Intravenous Every 24 hours 12/02/20 1838 12/06/20 1721   12/02/20 1730  azithromycin (ZITHROMAX) tablet 500 mg        500 mg Oral  Once 12/02/20 1726 12/02/20 1956       Subjective:  Patient seen and examined the bedside this morning.  Hemodynamically stable and comfortable.  Had a good sleep last night.  Denies any new complaints   Objective: Vitals:   12/10/20 1840 12/10/20 2148 12/11/20 0047 12/11/20 0500  BP: (!) 124/58 115/66 135/60 (!) 144/75  Pulse: 64 64 69 66  Resp: 18 18 18 16   Temp: 98.4 F (36.9 C) 98.3 F (36.8 C) 98.7 F (37.1 C) (!) 97.5 F (36.4 C)  TempSrc:  Oral Oral Oral  SpO2: 97% 95% 91% 96%  Weight:      Height:        Intake/Output Summary (Last 24 hours) at 12/11/2020 5643 Last data filed at 12/10/2020 1857 Gross per 24 hour  Intake 960 ml  Output --  Net 960 ml    Filed Weights   12/02/20 1612  Weight: 91 kg    Examination:  General exam: Overall comfortable, not in distress, pleasant elderly female HEENT: PERRL Respiratory system:  no wheezes or crackles , mildly diminished air sounds bilaterally Cardiovascular system: S1 & S2 heard, RRR.  Gastrointestinal system: Abdomen is nondistended, soft and nontender. Central nervous system: Alert and oriented Extremities: No edema, no  clubbing ,no cyanosis Skin: No rashes, no ulcers,no icterus       Data Reviewed: I have personally reviewed following labs and imaging studies  CBC: No results for input(s): WBC, NEUTROABS, HGB, HCT, MCV, PLT in the last 168 hours.  Basic Metabolic Panel: Recent Labs  Lab 12/09/20 0701  CREATININE 0.62   GFR: Estimated Creatinine Clearance: 69 mL/min (by C-G formula based on SCr of 0.62 mg/dL). Liver Function Tests: No results for input(s): AST, ALT,  ALKPHOS, BILITOT, PROT, ALBUMIN in the last 168 hours.  No results for input(s): LIPASE, AMYLASE in the last 168 hours. No results for input(s): AMMONIA in the last 168 hours. Coagulation Profile: No results for input(s): INR, PROTIME in the last 168 hours. Cardiac Enzymes: No results for input(s): CKTOTAL, CKMB, CKMBINDEX, TROPONINI in the last 168 hours. BNP (last 3 results) No results for input(s): PROBNP in the last 8760 hours. HbA1C: No results for input(s): HGBA1C in the last 72 hours. CBG: No results for input(s): GLUCAP in the last 168 hours. Lipid Profile: No results for input(s): CHOL, HDL, LDLCALC, TRIG, CHOLHDL, LDLDIRECT in the last 72 hours. Thyroid Function Tests: No results for input(s): TSH, T4TOTAL, FREET4, T3FREE, THYROIDAB in the last 72 hours. Anemia Panel: No results for input(s): VITAMINB12, FOLATE, FERRITIN, TIBC, IRON, RETICCTPCT in the last 72 hours. Sepsis Labs: No results for input(s): PROCALCITON, LATICACIDVEN in the last 168 hours.  Recent Results (from the past 240 hour(s))  Resp Panel by RT-PCR (Flu A&B, Covid) Nasopharyngeal Swab     Status: None   Collection Time: 12/02/20  4:14 PM   Specimen: Nasopharyngeal Swab; Nasopharyngeal(NP) swabs in vial transport medium  Result Value Ref Range Status   SARS Coronavirus 2 by RT PCR NEGATIVE NEGATIVE Final    Comment: (NOTE) SARS-CoV-2 target nucleic acids are NOT DETECTED.  The SARS-CoV-2 RNA is generally detectable in upper respiratory specimens during the acute phase of infection. The lowest concentration of SARS-CoV-2 viral copies this assay can detect is 138 copies/mL. A negative result does not preclude SARS-Cov-2 infection and should not be used as the sole basis for treatment or other patient management decisions. A negative result may occur with  improper specimen collection/handling, submission of specimen other than nasopharyngeal swab, presence of viral mutation(s) within the areas targeted  by this assay, and inadequate number of viral copies(<138 copies/mL). A negative result must be combined with clinical observations, patient history, and epidemiological information. The expected result is Negative.  Fact Sheet for Patients:  EntrepreneurPulse.com.au  Fact Sheet for Healthcare Providers:  IncredibleEmployment.be  This test is no t yet approved or cleared by the [place] FDA and  has been authorized for detection and/or diagnosis of SARS-CoV-2 by FDA under an Emergency Use Authorization (EUA). This EUA will remain  in effect (meaning this test can be used) for the duration of the COVID-19 declaration under Section 564(b)(1) of the Act, 21 U.S.C.section 360bbb-3(b)(1), unless the authorization is terminated  or revoked sooner.       Influenza A by PCR NEGATIVE NEGATIVE Final   Influenza B by PCR NEGATIVE NEGATIVE Final    Comment: (NOTE) The Xpert Xpress SARS-CoV-2/FLU/RSV plus assay is intended as an aid in the diagnosis of influenza from Nasopharyngeal swab specimens and should not be used as a sole basis for treatment. Nasal washings and aspirates are unacceptable for Xpert Xpress SARS-CoV-2/FLU/RSV testing.  Fact Sheet for Patients: EntrepreneurPulse.com.au  Fact Sheet for Healthcare Providers: IncredibleEmployment.be  This test is not yet approved  or cleared by the [place] and has been authorized for detection and/or diagnosis of SARS-CoV-2 by FDA under an Emergency Use Authorization (EUA). This EUA will remain in effect (meaning this test can be used) for the duration of the COVID-19 declaration under Section 564(b)(1) of the Act, 21 U.S.C. section 360bbb-3(b)(1), unless the authorization is terminated or revoked.  Performed at [facility], [address] [place], [place] [address]   Respiratory (~20 pathogens) panel by PCR     Status: None   Collection  Time: 12/02/20  9:22 PM   Specimen: Nasopharyngeal Swab; Respiratory  Result Value Ref Range Status   Adenovirus NOT DETECTED NOT DETECTED Final   Coronavirus 229E NOT DETECTED NOT DETECTED Final    Comment: (NOTE) The Coronavirus on the Respiratory Panel, DOES NOT test for the novel  Coronavirus (2019 nCoV)    Coronavirus HKU1 NOT DETECTED NOT DETECTED Final   Coronavirus NL63 NOT DETECTED NOT DETECTED Final   Coronavirus OC43 NOT DETECTED NOT DETECTED Final   Metapneumovirus NOT DETECTED NOT DETECTED Final   Rhinovirus / Enterovirus NOT DETECTED NOT DETECTED Final   Influenza A NOT DETECTED NOT DETECTED Final   Influenza B NOT DETECTED NOT DETECTED Final   Parainfluenza Virus 1 NOT DETECTED NOT DETECTED Final   Parainfluenza Virus 2 NOT DETECTED NOT DETECTED Final   Parainfluenza Virus 3 NOT DETECTED NOT DETECTED Final   Parainfluenza Virus 4 NOT DETECTED NOT DETECTED Final   Respiratory Syncytial Virus NOT DETECTED NOT DETECTED Final   Bordetella pertussis NOT DETECTED NOT DETECTED Final   Bordetella Parapertussis NOT DETECTED NOT DETECTED Final   Chlamydophila pneumoniae NOT DETECTED NOT DETECTED Final   Mycoplasma pneumoniae NOT DETECTED NOT DETECTED Final    Comment: Performed at [facility], [place]. [address], [place], [place] [address]         Radiology Studies: No results found.      Scheduled Meds:  busPIRone  7.5 mg Oral TID   carvedilol  3.125 mg Oral BID WC   citalopram  40 mg Oral Daily   diltiazem  120 mg Oral Daily   enoxaparin (LOVENOX) injection  40 mg Subcutaneous Q24H   fluticasone furoate-vilanterol  1 puff Inhalation Daily   guaiFENesin-dextromethorphan  10 mL Oral Q6H   ipratropium-albuterol  3 mL Nebulization BID   levothyroxine  175 mcg Oral Daily   lisinopril  10 mg Oral Daily   polyethylene glycol  17 g Oral Daily   predniSONE  40 mg Oral Q breakfast   sodium chloride flush  3 mL Intravenous Q12H   Continuous Infusions:   LOS: 9  days    Time spent: 25 mins.More than 50% of that time was spent in counseling and/or coordination of care.      Shelly Coss, MD [facility] P11/07/2020, 7:28 AM

## 2020-12-11 NOTE — TOC Progression Note (Signed)
Transition of Care [facility]) - Progression Note    Patient Details  Name: [name] MRN: [id number] Date of Birth: [birth year]  [facility]) CM/SW Contact  Izola Price, RN Phone Number: 12/11/2020, 3:17 PM  Clinical Narrative:  Patient is due to discharge to [facility] if bed available Monday 12/12/20. Patient's status has improved but family support/presence to help patient is not at safe discharge level after discussion with son, who them also revisited this with patient as well. No one available during the day or night and patient lives alone. Home oxygen will also be in place with increased fall risk tubings. Patient does not feel she is safe to be at home and wishes to proceed with STR as planned. Updated provider. Simmie Davies RN CM      Expected Discharge Plan: Skilled Nursing Facility Barriers to Discharge: Continued Medical Work up  Expected Discharge Plan and Services Expected Discharge Plan: [place]   Discharge Planning Services: CM Consult Post Acute Care Choice: [place] Living arrangements for the past 2 months: Apartment                 DME Arranged: N/A DME Agency: NA       HH Arranged: NA HH Agency: NA         Social Determinants of Health (SDOH) Interventions    Readmission Risk Interventions No flowsheet data found.

## 2020-12-11 NOTE — Progress Notes (Signed)
Physical Therapy Note  Oxygen Qualification   Patient Details Name: [name] MRN: [id number] DOB: 1950/02/24 Today's Date: 12/11/2020    SpO2 on room air at rest = 89% SpO2 on room air while ambulating = 86% SpO2 on 3 liters of O2 while ambulating = 93%  Pt desaturates on room air with mobility and requires supplemental oxygen to maintain a safe saturation level.     2:11 PM, 12/11/20 Etta Grandchild, PT, DPT Physical Therapist - [facility]  [phone] [facility])

## 2020-12-11 NOTE — Progress Notes (Signed)
Physical Therapy Treatment Patient Details Name: [name] MRN: [id number] DOB: 1950-10-22 Today's Date: 12/11/2020   History of Present Illness [name] is a 70 y.o. female with medical history significant for morbid obesity, COPD, hypertension, hyperlipidemia, PAD, hypothyroidism, anxiety with panic disorder, who presented to the hospital with shortness of breath, productive cough with increased sputum production and wheezing. She was admitted to the hospital for acute exacerbation of COPD and acute hypoxemic and hypercapnic respiratory failure.    PT Comments    Pt in bed upon entry agreeable to session.  Patient reports feeling much improved this date, denies any frank DOE this date.  Patient agreeable to PT session anxious to demonstrate her improvements and walking tolerance.  Patient on 4 L/min as received, ambulates 120 feet with RW on 4L per min, has no frank dyspnea, then repeats this same effort on 3 L/min on.  Both efforts patient has a terminal saturation of 93% SPO2.  Patient left on toilet with call bell nearby, then Pryor Curia returns for third AMB effort on room air to establish qualification for O2 (see prior note).  Discussed potentially updating PT recommendations for home with [facility] PT instead of [facility] at discharge.  Patient reports high confidence in being able to return to home independently reports confidence in being able to access her living environment safely with a walker.  Author contacted patient's son at her request as well as attending physician [facility] and RN to give updates on patient's status and PT DC recs.  Patient requires no physical assistance in session.  Patient has no frank L OB in session.  Patient has no frank dyspnea in session, although she does desaturate to 86% after AMB 120 feet on RA.     Recommendations for follow up therapy are one component of a multi-disciplinary discharge planning process, led by the attending physician.  Recommendations may be  updated based on patient status, additional functional criteria and insurance authorization.  Follow Up Recommendations  Home health PT     Assistance Recommended at Discharge Set up Supervision/Assistance  Equipment Recommendations  Other (comment) (Supplemental O2)    Recommendations for Other Services       Precautions / Restrictions Precautions Precautions: Fall Restrictions Weight Bearing Restrictions: No     Mobility  Bed Mobility Overal bed mobility: Modified Independent Bed Mobility: Supine to Sit;Sit to Supine     Supine to sit: HOB elevated Sit to supine: HOB elevated        Transfers Overall transfer level: Modified independent Equipment used: Rolling walker (2 wheels) Transfers: Sit to/from Stand                  Ambulation/Gait Ambulation/Gait assistance: Supervision Gait Distance (Feet): 120 Feet Assistive device: Rolling walker (2 wheels) Gait Pattern/deviations: WFL(Within Functional Limits) Gait velocity: 0.34m/s   General Gait Details: performed twice with a seated recovery interval, no frank dyspnea either time, 4L and 3L respectively; then a third 153ft effort on room air for O2 quaification.   Stairs             Wheelchair Mobility    Modified Rankin (Stroke Patients Only)       Balance                                            Cognition Arousal/Alertness: Awake/alert Behavior During Therapy: WFL for  tasks assessed/performed Overall Cognitive Status: Within Functional Limits for tasks assessed                                 General Comments: AOx4        Exercises      General Comments        Pertinent Vitals/Pain Pain Assessment: No/denies pain    Home Living                          Prior Function            PT Goals (current goals can now be found in the care plan section) Acute Rehab PT Goals Patient Stated Goal: To get stronger PT Goal  Formulation: With patient Time For Goal Achievement: 12/22/20 Potential to Achieve Goals: Good Progress towards PT goals: Progressing toward goals    Frequency    Min 2X/week      PT Plan Discharge plan needs to be updated;Equipment recommendations need to be updated    Co-evaluation              AM-PAC PT "6 Clicks" Mobility   Outcome Measure  Help needed turning from your back to your side while in a flat bed without using bedrails?: None Help needed moving from lying on your back to sitting on the side of a flat bed without using bedrails?: None Help needed moving to and from a bed to a chair (including a wheelchair)?: None Help needed standing up from a chair using your arms (e.g., wheelchair or bedside chair)?: None Help needed to walk in hospital room?: A Little Help needed climbing 3-5 steps with a railing? : A Little 6 Click Score: 22    End of Session Equipment Utilized During Treatment: Gait belt;Oxygen Activity Tolerance: Patient tolerated treatment well;No increased pain Patient left: in bed;with call bell/phone within reach Nurse Communication: Mobility status PT Visit Diagnosis: Other abnormalities of gait and mobility (R26.89);Muscle weakness (generalized) (M62.81)     Time: 7737-3668 PT Time Calculation (min) (ACUTE ONLY): 36 min  Charges:  $Therapeutic Activity: 23-37 mins                    2:36 PM, 12/11/20 Etta Grandchild, PT, DPT Physical Therapist - [facility]  [phone] ([place])    McCleary C 12/11/2020, 2:31 PM

## 2020-12-12 DIAGNOSIS — J449 Chronic obstructive pulmonary disease, unspecified: Secondary | ICD-10-CM | POA: Diagnosis not present

## 2020-12-12 DIAGNOSIS — G4730 Sleep apnea, unspecified: Secondary | ICD-10-CM | POA: Diagnosis not present

## 2020-12-12 DIAGNOSIS — K219 Gastro-esophageal reflux disease without esophagitis: Secondary | ICD-10-CM | POA: Diagnosis not present

## 2020-12-12 DIAGNOSIS — N1831 Chronic kidney disease, stage 3a: Secondary | ICD-10-CM | POA: Diagnosis not present

## 2020-12-12 DIAGNOSIS — L409 Psoriasis, unspecified: Secondary | ICD-10-CM | POA: Diagnosis not present

## 2020-12-12 DIAGNOSIS — J441 Chronic obstructive pulmonary disease with (acute) exacerbation: Secondary | ICD-10-CM | POA: Diagnosis not present

## 2020-12-12 DIAGNOSIS — R5381 Other malaise: Secondary | ICD-10-CM | POA: Diagnosis not present

## 2020-12-12 DIAGNOSIS — E662 Morbid (severe) obesity with alveolar hypoventilation: Secondary | ICD-10-CM | POA: Diagnosis not present

## 2020-12-12 DIAGNOSIS — R531 Weakness: Secondary | ICD-10-CM | POA: Diagnosis not present

## 2020-12-12 DIAGNOSIS — Z9981 Dependence on supplemental oxygen: Secondary | ICD-10-CM | POA: Diagnosis not present

## 2020-12-12 DIAGNOSIS — I120 Hypertensive chronic kidney disease with stage 5 chronic kidney disease or end stage renal disease: Secondary | ICD-10-CM | POA: Diagnosis not present

## 2020-12-12 DIAGNOSIS — F17210 Nicotine dependence, cigarettes, uncomplicated: Secondary | ICD-10-CM | POA: Diagnosis not present

## 2020-12-12 DIAGNOSIS — R0609 Other forms of dyspnea: Secondary | ICD-10-CM | POA: Diagnosis not present

## 2020-12-12 DIAGNOSIS — Z7401 Bed confinement status: Secondary | ICD-10-CM | POA: Diagnosis not present

## 2020-12-12 DIAGNOSIS — E039 Hypothyroidism, unspecified: Secondary | ICD-10-CM | POA: Diagnosis not present

## 2020-12-12 DIAGNOSIS — Z6837 Body mass index (BMI) 37.0-37.9, adult: Secondary | ICD-10-CM | POA: Diagnosis not present

## 2020-12-12 DIAGNOSIS — F410 Panic disorder [episodic paroxysmal anxiety] without agoraphobia: Secondary | ICD-10-CM | POA: Diagnosis not present

## 2020-12-12 DIAGNOSIS — E7800 Pure hypercholesterolemia, unspecified: Secondary | ICD-10-CM | POA: Diagnosis not present

## 2020-12-12 DIAGNOSIS — G4700 Insomnia, unspecified: Secondary | ICD-10-CM | POA: Diagnosis not present

## 2020-12-12 DIAGNOSIS — M1990 Unspecified osteoarthritis, unspecified site: Secondary | ICD-10-CM | POA: Diagnosis not present

## 2020-12-12 DIAGNOSIS — F321 Major depressive disorder, single episode, moderate: Secondary | ICD-10-CM | POA: Diagnosis not present

## 2020-12-12 DIAGNOSIS — J9601 Acute respiratory failure with hypoxia: Secondary | ICD-10-CM | POA: Diagnosis not present

## 2020-12-12 DIAGNOSIS — F419 Anxiety disorder, unspecified: Secondary | ICD-10-CM | POA: Diagnosis not present

## 2020-12-12 DIAGNOSIS — R942 Abnormal results of pulmonary function studies: Secondary | ICD-10-CM | POA: Diagnosis not present

## 2020-12-12 DIAGNOSIS — J9611 Chronic respiratory failure with hypoxia: Secondary | ICD-10-CM | POA: Diagnosis not present

## 2020-12-12 DIAGNOSIS — M62838 Other muscle spasm: Secondary | ICD-10-CM | POA: Diagnosis not present

## 2020-12-12 DIAGNOSIS — I739 Peripheral vascular disease, unspecified: Secondary | ICD-10-CM | POA: Diagnosis not present

## 2020-12-12 LAB — RESP PANEL BY RT-PCR (FLU A&B, COVID) ARPGX2
Influenza A by PCR: NEGATIVE
Influenza B by PCR: NEGATIVE
SARS Coronavirus 2 by RT PCR: NEGATIVE

## 2020-12-12 MED ORDER — CLONAZEPAM 1 MG PO TABS: 0.5000 mg | ORAL_TABLET | Freq: Three times a day (TID) | ORAL | 0 refills | Status: DC | PRN

## 2020-12-12 MED ORDER — IPRATROPIUM-ALBUTEROL 0.5-2.5 (3) MG/3ML IN SOLN: 3.0000 mL | Freq: Four times a day (QID) | RESPIRATORY_TRACT | Status: DC | PRN

## 2020-12-12 NOTE — Discharge Summary (Signed)
Physician Discharge Summary  [name] SHF:[id number] DOB: 1950/08/26 DOA: 12/02/2020  PCP: Jearld Fenton, NP  Admit date: 12/02/2020 Discharge date: 12/12/2020  Admitted From: Home Disposition:  Home  Discharge Condition:Stable CODE STATUS:DNR Diet recommendation: Heart Healthy    Brief/Interim Summary:  Patient is a 70 year old female with history of obesity, COPD, hypertension, hyperlipidemia, hypothyroidism, anxiety who presented with shortness of breath, cough, wheezing.  She was admitted for COPD exacerbation management.  She was initially started on steroids, she completed the course.  Respiratory status has improved.  PT/OT recommending skilled nursing facility on discharge.  She qualified for home oxygen.  Medically stable for discharge.  Following problems were addressed during her hospitalization:  COPD exacerbation: Not on oxygen at home.  Hypoxic on room air with 70% saturation on presentation  Currently on 2-3 L of oxygen per minute. Steroids has been changed to oral,she finished the course.    Wheezings have resolved.  She qualified for home oxygen   Smoker: Counseled to quit.     Debility/deconditioning :PT/OT recommending skilled nursing facility on discharge   Other chronic medical issues: anxiety/hypertension/hypothyroidism.  Stable on current home medications     Discharge Diagnoses:  Principal Problem:   COPD with acute exacerbation ([place]) Active Problems:   Anxiety disorder   Adult hypothyroidism   Panic disorder without agoraphobia with moderate panic attacks   Atherosclerotic peripheral vascular disease with intermittent claudication (HCC)   Benign essential HTN   Psoriasis   Class 2 severe obesity due to excess calories with serious comorbidity and body mass index (BMI) of 37.0 to 37.9 in adult [facility])   Acute hypoxemic respiratory failure [facility])    Discharge Instructions  Discharge Instructions     Diet - low sodium heart healthy   Complete by:  As directed    Discharge instructions   Complete by: As directed    1)Please take your medications as instructed 2)Follow up with pulmonology as an outpatient   Increase activity slowly   Complete by: As directed       Allergies as of 12/12/2020       Reactions   Ciprofloxacin Hives, Diarrhea   Mucinex [guaifenesin Er] Nausea Only   Per pt   Oxycodone Diarrhea   Oxycodone-acetaminophen Diarrhea   Oxycodone-acetaminophen Diarrhea   Sulfa Antibiotics Rash   Throat, mouth Throat, mouth        Medication List     STOP taking these medications    furosemide 20 MG tablet Commonly known as: LASIX   hydrOXYzine 25 MG tablet Commonly known as: ATARAX/VISTARIL       TAKE these medications    acetaminophen 500 MG tablet Commonly known as: TYLENOL Take 500 mg by mouth every 6 (six) hours as needed for moderate pain.   albuterol 108 (90 Base) MCG/ACT inhaler Commonly known as: VENTOLIN HFA INHALE 2 PUFFS BY MOUTH INTO THE LUNGS EVERY 6 HOURS AS NEEDED FOR WHEEZING OR SHORTNESS   busPIRone 7.5 MG tablet Commonly known as: BUSPAR TAKE ONE TABLET BY MOUTH THREE TIMES DAILY   carvedilol 3.125 MG tablet Commonly known as: COREG TAKE ONE TABLET BY MOUTH TWICE DAILY TAKE WITH A MEAL   citalopram 40 MG tablet Commonly known as: CELEXA Take 1 tablet (40 mg total) by mouth daily.   clonazePAM 1 MG tablet Commonly known as: KLONOPIN Take 0.5 tablets (0.5 mg total) by mouth 3 (three) times daily as needed for anxiety. TAKE ONE TABLET BY MOUTH EVERY MORNING, ONE-HALF TABLET IN  THE AFTERNOON & ONE TABLET AT BEDTIME What changed:  how much to take how to take this when to take this reasons to take this   diltiazem 120 MG 24 hr capsule Commonly known as: CARDIZEM CD TAKE ONE CAPSULE BY MOUTH ONCE DAILY   fluticasone furoate-vilanterol 200-25 MCG/INH Aepb Commonly known as: Breo Ellipta Inhale 1 puff into the lungs daily.   ipratropium-albuterol 0.5-2.5 (3) MG/3ML  Soln Commonly known as: DUONEB Take 3 mLs by nebulization every 6 (six) hours as needed.   levothyroxine 175 MCG tablet Commonly known as: SYNTHROID Take 1 tablet (175 mcg total) by mouth daily.   lisinopril 10 MG tablet Commonly known as: ZESTRIL TAKE ONE TABLET BY MOUTH ONCE DAILY   pravastatin 10 MG tablet Commonly known as: PRAVACHOL Take 1 tablet (10 mg total) by mouth daily.   Quad Cane/Small Base Misc 1 Device by Does not apply route daily.   TREMFYA [place] Inject into the skin every 30 (thirty) days.               Durable Medical Equipment  (From admission, onward)           Start     Ordered   12/11/20 1420  For home use only DME oxygen  Once       Question Answer Comment  Length of Need Lifetime   Mode or (Route) Nasal cannula   Liters per Minute 3   Frequency Continuous (stationary and portable oxygen unit needed)   Oxygen delivery system Gas      12/11/20 1419            Contact information for after-discharge care     [facility] Preferred SNF .   Service: Skilled Nursing Contact information: [place] [address] [phone]                    Allergies  Allergen Reactions   Ciprofloxacin Hives and Diarrhea   Mucinex [Guaifenesin Er] Nausea Only    Per pt   Oxycodone Diarrhea   Oxycodone-Acetaminophen Diarrhea   Oxycodone-Acetaminophen Diarrhea   Sulfa Antibiotics Rash    Throat, mouth Throat, mouth    Consultations: None   Procedures/Studies: CT Angio Chest Pulmonary Embolism (PE) W or WO Contrast  Result Date: 12/07/2020 CLINICAL DATA:  PE suspected, shortness of breath, productive cough EXAM: CT ANGIOGRAPHY CHEST WITH CONTRAST TECHNIQUE: Multidetector CT imaging of the chest was performed using the standard protocol during bolus administration of intravenous contrast. Multiplanar CT image  reconstructions and MIPs were obtained to evaluate the vascular anatomy. CONTRAST:  141mL OMNIPAQUE IOHEXOL 350 MG/ML SOLN COMPARISON:  None. FINDINGS: Cardiovascular: Satisfactory opacification of the pulmonary arteries to the segmental level. No evidence of pulmonary embolism. Normal heart size. Three-vessel coronary artery calcifications. No pericardial effusion. Aortic atherosclerosis. Mediastinum/Nodes: No enlarged mediastinal, hilar, or axillary lymph nodes. Thyroid gland, trachea, and esophagus demonstrate no significant findings. Lungs/Pleura: Mild-to-moderate centrilobular and paraseptal emphysema. Diffuse bilateral bronchial wall thickening. Small bilateral pleural effusions and associated atelectasis or consolidation. Upper Abdomen: No acute abnormality. Somewhat coarse, nodular contour of the liver in the included upper abdomen. Musculoskeletal: No chest wall abnormality. No acute or significant osseous findings. Review of the MIP images confirms the above findings. IMPRESSION: 1. Negative examination for pulmonary embolism. 2. Small bilateral pleural effusions and associated atelectasis or consolidation. 3. Emphysema and diffuse bilateral bronchial wall thickening.  4. Coronary artery disease. 5. Somewhat coarse, nodular contour of the liver in the included upper abdomen, suggestive of cirrhosis. Aortic Atherosclerosis (ICD10-I70.0) and Emphysema (ICD10-J43.9). Electronically Signed   By: Delanna Ahmadi M.D.   On: 12/07/2020 14:26   DG Chest Port 1 View  Result Date: 12/06/2020 CLINICAL DATA:  Short of breath.  History of COPD and hypertension. EXAM: PORTABLE CHEST 1 VIEW COMPARISON:  12/02/2020. FINDINGS: Mild opacity noted at both lung bases, increased from the prior study, consistent with atelectasis. Remainder of the lungs is clear. No definite pleural effusion.  No evidence of a pneumothorax. Cardiac silhouette is normal in size. IMPRESSION: 1. Mild lung base opacities developing since the prior  study consistent with atelectasis. No other change. No convincing pneumonia and no evidence of pulmonary edema. Electronically Signed   By: Lajean Manes M.D.   On: 12/06/2020 14:00   DG Chest Port 1 View  Result Date: 12/02/2020 CLINICAL DATA:  SOB x 2 days, history of COPD, hypertension, smoker EXAM: PORTABLE CHEST 1 VIEW COMPARISON:  12/02/17 FINDINGS: The heart and mediastinal contours are unchanged. Aortic calcifications. No focal consolidation. No pulmonary edema. No pleural effusion. No pneumothorax. No acute osseous abnormality. IMPRESSION: No active disease. Electronically Signed   By: Iven Finn M.D.   On: 12/02/2020 16:52      Subjective:  Patient seen and examined at the bedside this morning.  Hemodynamically stable for discharge.  I called and discussed about the discharge planning with the son on phone   Discharge Exam: Vitals:   12/12/20 0734 12/12/20 0900  BP: 123/61   Pulse: 67   Resp: 18   Temp: 98.1 F (36.7 C)   SpO2: 97% 95%   Vitals:   12/12/20 0023 12/12/20 0532 12/12/20 0734 12/12/20 0900  BP: (!) 160/74 (!) 148/70 123/61   Pulse: 71 65 67   Resp:  16 18   Temp: 97.8 F (36.6 C) 98.4 F (36.9 C) 98.1 F (36.7 C)   TempSrc:  Oral Oral   SpO2: 95% 99% 97% 95%  Weight:      Height:        General: Pt is alert, awake, not in acute distress Cardiovascular: RRR, S1/S2 +, no rubs, no gallops Respiratory: CTA bilaterally, no wheezing, no rhonchi Abdominal: Soft, NT, ND, bowel sounds + Extremities: no edema, no cyanosis    The results of significant diagnostics from this hospitalization (including imaging, microbiology, ancillary and laboratory) are listed below for reference.     Microbiology: Recent Results (from the past 240 hour(s))  Resp Panel by RT-PCR (Flu A&B, Covid) Nasopharyngeal Swab     Status: None   Collection Time: 12/02/20  4:14 PM   Specimen: Nasopharyngeal Swab; Nasopharyngeal(NP) swabs in vial transport medium  Result Value  Ref Range Status   SARS Coronavirus 2 by RT PCR NEGATIVE NEGATIVE Final    Comment: (NOTE) SARS-CoV-2 target nucleic acids are NOT DETECTED.  The SARS-CoV-2 RNA is generally detectable in upper respiratory specimens during the acute phase of infection. The lowest concentration of SARS-CoV-2 viral copies this assay can detect is 138 copies/mL. A negative result does not preclude SARS-Cov-2 infection and should not be used as the sole basis for treatment or other patient management decisions. A negative result may occur with  improper specimen collection/handling, submission of specimen other than nasopharyngeal swab, presence of viral mutation(s) within the areas targeted by this assay, and inadequate number of viral copies(<138 copies/mL). A negative result must be combined with  clinical observations, patient history, and epidemiological information. The expected result is Negative.  Fact Sheet for Patients:  EntrepreneurPulse.com.au  Fact Sheet for Healthcare Providers:  IncredibleEmployment.be  This test is no t yet approved or cleared by the [place] FDA and  has been authorized for detection and/or diagnosis of SARS-CoV-2 by FDA under an Emergency Use Authorization (EUA). This EUA will remain  in effect (meaning this test can be used) for the duration of the COVID-19 declaration under Section 564(b)(1) of the Act, 21 U.S.C.section 360bbb-3(b)(1), unless the authorization is terminated  or revoked sooner.       Influenza A by PCR NEGATIVE NEGATIVE Final   Influenza B by PCR NEGATIVE NEGATIVE Final    Comment: (NOTE) The Xpert Xpress SARS-CoV-2/FLU/RSV plus assay is intended as an aid in the diagnosis of influenza from Nasopharyngeal swab specimens and should not be used as a sole basis for treatment. Nasal washings and aspirates are unacceptable for Xpert Xpress SARS-CoV-2/FLU/RSV testing.  Fact Sheet for  Patients: EntrepreneurPulse.com.au  Fact Sheet for Healthcare Providers: IncredibleEmployment.be  This test is not yet approved or cleared by the [place] FDA and has been authorized for detection and/or diagnosis of SARS-CoV-2 by FDA under an Emergency Use Authorization (EUA). This EUA will remain in effect (meaning this test can be used) for the duration of the COVID-19 declaration under Section 564(b)(1) of the Act, 21 U.S.C. section 360bbb-3(b)(1), unless the authorization is terminated or revoked.  Performed at [facility], [address] [place], [place] [address]   Respiratory (~20 pathogens) panel by PCR     Status: None   Collection Time: 12/02/20  9:22 PM   Specimen: Nasopharyngeal Swab; Respiratory  Result Value Ref Range Status   Adenovirus NOT DETECTED NOT DETECTED Final   Coronavirus 229E NOT DETECTED NOT DETECTED Final    Comment: (NOTE) The Coronavirus on the Respiratory Panel, DOES NOT test for the novel  Coronavirus (2019 nCoV)    Coronavirus HKU1 NOT DETECTED NOT DETECTED Final   Coronavirus NL63 NOT DETECTED NOT DETECTED Final   Coronavirus OC43 NOT DETECTED NOT DETECTED Final   Metapneumovirus NOT DETECTED NOT DETECTED Final   Rhinovirus / Enterovirus NOT DETECTED NOT DETECTED Final   Influenza A NOT DETECTED NOT DETECTED Final   Influenza B NOT DETECTED NOT DETECTED Final   Parainfluenza Virus 1 NOT DETECTED NOT DETECTED Final   Parainfluenza Virus 2 NOT DETECTED NOT DETECTED Final   Parainfluenza Virus 3 NOT DETECTED NOT DETECTED Final   Parainfluenza Virus 4 NOT DETECTED NOT DETECTED Final   Respiratory Syncytial Virus NOT DETECTED NOT DETECTED Final   Bordetella pertussis NOT DETECTED NOT DETECTED Final   Bordetella Parapertussis NOT DETECTED NOT DETECTED Final   Chlamydophila pneumoniae NOT DETECTED NOT DETECTED Final   Mycoplasma pneumoniae NOT DETECTED NOT DETECTED Final    Comment: Performed at  [facility], [place]. [address] [place], [place] [address]     Labs: BNP (last 3 results) Recent Labs    07/28/20 1004 12/02/20 1612  BNP 45 503.5*   Basic Metabolic Panel: Recent Labs  Lab 12/09/20 0701  CREATININE 0.62   Liver Function Tests: No results for input(s): AST, ALT, ALKPHOS, BILITOT, PROT, ALBUMIN in the last 168 hours. No results for input(s): LIPASE, AMYLASE in the last 168 hours. No results for input(s): AMMONIA in the last 168 hours. CBC: No results for input(s): WBC, NEUTROABS, HGB, HCT, MCV, PLT in the last 168 hours. Cardiac Enzymes: No results  for input(s): CKTOTAL, CKMB, CKMBINDEX, TROPONINI in the last 168 hours. BNP: Invalid input(s): POCBNP CBG: No results for input(s): GLUCAP in the last 168 hours. D-Dimer No results for input(s): DDIMER in the last 72 hours. Hgb A1c No results for input(s): HGBA1C in the last 72 hours. Lipid Profile No results for input(s): CHOL, HDL, LDLCALC, TRIG, CHOLHDL, LDLDIRECT in the last 72 hours. Thyroid function studies No results for input(s): TSH, T4TOTAL, T3FREE, THYROIDAB in the last 72 hours.  Invalid input(s): FREET3 Anemia work up No results for input(s): VITAMINB12, FOLATE, FERRITIN, TIBC, IRON, RETICCTPCT in the last 72 hours. Urinalysis    Component Value Date/Time   COLORURINE YELLOW (A) 12/30/2017 0649   APPEARANCEUR CLEAR (A) 12/30/2017 0649   APPEARANCEUR Clear 07/26/2016 1445   LABSPEC 1.013 12/30/2017 0649   LABSPEC 1.012 10/10/2011 1219   PHURINE 6.0 12/30/2017 0649   GLUCOSEU NEGATIVE 12/30/2017 0649   GLUCOSEU Negative 10/10/2011 1219   HGBUR NEGATIVE 12/30/2017 0649   BILIRUBINUR negative 07/10/2019 0934   BILIRUBINUR Negative 07/26/2016 1445   BILIRUBINUR Negative 10/10/2011 1219   KETONESUR NEGATIVE 12/30/2017 0649   PROTEINUR Negative 07/10/2019 0934   PROTEINUR NEGATIVE 12/30/2017 0649   UROBILINOGEN 0.2 07/10/2019 0934   NITRITE negative 07/10/2019 0934   NITRITE NEGATIVE  12/30/2017 0649   LEUKOCYTESUR Small (1+) (A) 07/10/2019 0934   LEUKOCYTESUR 3+ (A) 07/26/2016 1445   LEUKOCYTESUR 2+ 10/10/2011 1219   Sepsis Labs Invalid input(s): PROCALCITONIN,  WBC,  LACTICIDVEN Microbiology Recent Results (from the past 240 hour(s))  Resp Panel by RT-PCR (Flu A&B, Covid) Nasopharyngeal Swab     Status: None   Collection Time: 12/02/20  4:14 PM   Specimen: Nasopharyngeal Swab; Nasopharyngeal(NP) swabs in vial transport medium  Result Value Ref Range Status   SARS Coronavirus 2 by RT PCR NEGATIVE NEGATIVE Final    Comment: (NOTE) SARS-CoV-2 target nucleic acids are NOT DETECTED.  The SARS-CoV-2 RNA is generally detectable in upper respiratory specimens during the acute phase of infection. The lowest concentration of SARS-CoV-2 viral copies this assay can detect is 138 copies/mL. A negative result does not preclude SARS-Cov-2 infection and should not be used as the sole basis for treatment or other patient management decisions. A negative result may occur with  improper specimen collection/handling, submission of specimen other than nasopharyngeal swab, presence of viral mutation(s) within the areas targeted by this assay, and inadequate number of viral copies(<138 copies/mL). A negative result must be combined with clinical observations, patient history, and epidemiological information. The expected result is Negative.  Fact Sheet for Patients:  EntrepreneurPulse.com.au  Fact Sheet for Healthcare Providers:  IncredibleEmployment.be  This test is no t yet approved or cleared by the [place] FDA and  has been authorized for detection and/or diagnosis of SARS-CoV-2 by FDA under an Emergency Use Authorization (EUA). This EUA will remain  in effect (meaning this test can be used) for the duration of the COVID-19 declaration under Section 564(b)(1) of the Act, 21 U.S.C.section 360bbb-3(b)(1), unless the authorization is  terminated  or revoked sooner.       Influenza A by PCR NEGATIVE NEGATIVE Final   Influenza B by PCR NEGATIVE NEGATIVE Final    Comment: (NOTE) The Xpert Xpress SARS-CoV-2/FLU/RSV plus assay is intended as an aid in the diagnosis of influenza from Nasopharyngeal swab specimens and should not be used as a sole basis for treatment. Nasal washings and aspirates are unacceptable for Xpert Xpress SARS-CoV-2/FLU/RSV testing.  Fact Sheet for Patients: EntrepreneurPulse.com.au  Fact  Sheet for Healthcare Providers: IncredibleEmployment.be  This test is not yet approved or cleared by the [place] and has been authorized for detection and/or diagnosis of SARS-CoV-2 by FDA under an Emergency Use Authorization (EUA). This EUA will remain in effect (meaning this test can be used) for the duration of the COVID-19 declaration under Section 564(b)(1) of the Act, 21 U.S.C. section 360bbb-3(b)(1), unless the authorization is terminated or revoked.  Performed at [facility], [address] [place], [place] [address]   Respiratory (~20 pathogens) panel by PCR     Status: None   Collection Time: 12/02/20  9:22 PM   Specimen: Nasopharyngeal Swab; Respiratory  Result Value Ref Range Status   Adenovirus NOT DETECTED NOT DETECTED Final   Coronavirus 229E NOT DETECTED NOT DETECTED Final    Comment: (NOTE) The Coronavirus on the Respiratory Panel, DOES NOT test for the novel  Coronavirus (2019 nCoV)    Coronavirus HKU1 NOT DETECTED NOT DETECTED Final   Coronavirus NL63 NOT DETECTED NOT DETECTED Final   Coronavirus OC43 NOT DETECTED NOT DETECTED Final   Metapneumovirus NOT DETECTED NOT DETECTED Final   Rhinovirus / Enterovirus NOT DETECTED NOT DETECTED Final   Influenza A NOT DETECTED NOT DETECTED Final   Influenza B NOT DETECTED NOT DETECTED Final   Parainfluenza Virus 1 NOT DETECTED NOT DETECTED Final   Parainfluenza Virus 2 NOT DETECTED  NOT DETECTED Final   Parainfluenza Virus 3 NOT DETECTED NOT DETECTED Final   Parainfluenza Virus 4 NOT DETECTED NOT DETECTED Final   Respiratory Syncytial Virus NOT DETECTED NOT DETECTED Final   Bordetella pertussis NOT DETECTED NOT DETECTED Final   Bordetella Parapertussis NOT DETECTED NOT DETECTED Final   Chlamydophila pneumoniae NOT DETECTED NOT DETECTED Final   Mycoplasma pneumoniae NOT DETECTED NOT DETECTED Final    Comment: Performed at [facility], [place]. [address], [place], [place] [address]    Please note: You were cared for by a hospitalist during your hospital stay. Once you are discharged, your primary care physician will handle any further medical issues. Please note that NO REFILLS for any discharge medications will be authorized once you are discharged, as it is imperative that you return to your primary care physician (or establish a relationship with a primary care physician if you do not have one) for your post hospital discharge needs so that they can reassess your need for medications and monitor your lab values.    Time coordinating discharge: 40 minutes  SIGNED:   Shelly Coss, MD  [facility] 12/12/2020, 10:32 AM Pager [phone]  If 7PM-7AM, please contact night-coverage [email or web address] Password [email or web address]

## 2020-12-12 NOTE — TOC Transition Note (Signed)
Transition of Care [facility]) - CM/SW Discharge Note   Patient Details  Name: SANTIAGO GRAF MRN: [id number] Date of Birth: June 03, 1950  [facility] CM/SW Contact:  Kerin Salen, RN Phone Number: 12/12/2020, 12:24 PM   Clinical Narrative:  Patient to discharge via [facility] to [place] [facility] room 502. Left message for family. Nurse to call report to [phone]. TOC barriers resolved.     Final next level of care: Skilled Nursing Facility Barriers to Discharge: Barriers Resolved   Patient Goals and CMS Choice Patient states their goals for this hospitalization and ongoing recovery are:: Patient wants to go to SNF for rehab CMS Medicare.gov Compare Post Acute Care list provided to:: Patient Choice offered to / list presented to : Patient  Discharge Placement                Patient to be transferred to facility by: [place] Name of family member notified: Left message Patient and family notified of of transfer: 12/12/20  Discharge Plan and Services   Discharge Planning Services: CM Consult Post Acute Care Choice: [place]          DME Arranged: N/A DME Agency: NA       HH Arranged: NA HH Agency: NA        Social Determinants of Health (SDOH) Interventions     Readmission Risk Interventions No flowsheet data found.

## 2020-12-12 NOTE — Progress Notes (Signed)
Pt d/c to [facility] via EMS. No IV to remove. VSS. All paperwork sent with pt. EMS not able to take pt suitcase. Suitcase picked up by family.

## 2020-12-12 NOTE — Care Management Important Message (Signed)
Important Message  Patient Details  Name: [name] MRN: [id number] Date of Birth: July 19, 1950   Medicare Important Message Given:  Yes     [name] 12/12/2020, 10:56 AM

## 2020-12-12 NOTE — Progress Notes (Signed)
Occupational Therapy Treatment Patient Details Name: [name] MRN: [id number] DOB: [birth year] Today's Date: 12/12/2020   History of present illness [name] is a 70 y.o. female with medical history significant for morbid obesity, COPD, hypertension, hyperlipidemia, PAD, hypothyroidism, anxiety with panic disorder, who presented to the hospital with shortness of breath, productive cough with increased sputum production and wheezing. She was admitted to the hospital for acute exacerbation of COPD and acute hypoxemic and hypercapnic respiratory failure.   OT comments  Upon entering the room, pt supine in bed and agreeable to OT intervention. Pt declines self care tasks this session and requests to get out of her room. Pt performs bed mobility without assistance and dons B shoes with use of figure four position with set up A. Pt ambulates with RW with supervision progressing to Mod I level 350' without LOB. Pt picking up items from floor and obtaining items from RN station while managing RW without issue. OT discussed recommendation being changed to [facility] based on pt's progress. Pt reports at baseline her son comes over each day to feed her cat and change litter. Pt continues to be hesitant about returning home this session. All needs within reach and pt continues to benefit from OT intervention.    Recommendations for follow up therapy are one component of a multi-disciplinary discharge planning process, led by the attending physician.  Recommendations may be updated based on patient status, additional functional criteria and insurance authorization.    Follow Up Recommendations  Home health OT    Assistance Recommended at Discharge Intermittent Supervision/Assistance  Equipment Recommendations  BSC/3in1       Precautions / Restrictions Precautions Precautions: Fall       Mobility Bed Mobility Overal bed mobility: Modified Independent Bed Mobility: Supine to Sit;Sit to Supine      Supine to sit: HOB elevated Sit to supine: HOB elevated        Transfers Overall transfer level: Modified independent Equipment used: Rolling walker (2 wheels) Transfers: Sit to/from Stand Sit to Stand: Modified independent (Device/Increase time)                 Balance Overall balance assessment: Needs assistance Sitting-balance support: Feet supported;Bilateral upper extremity supported Sitting balance-Leahy Scale: Normal     Standing balance support: Bilateral upper extremity supported;During functional activity Standing balance-Leahy Scale: Good                             ADL either performed or assessed with clinical judgement   ADL Overall ADL's : Needs assistance/impaired                     Lower Body Dressing: Set up;Sitting/lateral leans               Functional mobility during ADLs: Modified independent;Supervision/safety;Rolling walker (2 wheels)      Extremity/Trunk Assessment Upper Extremity Assessment Upper Extremity Assessment: Overall WFL for tasks assessed;Generalized weakness   Lower Extremity Assessment Lower Extremity Assessment: Overall WFL for tasks assessed;Generalized weakness        Vision Patient Visual Report: No change from baseline            Cognition Arousal/Alertness: Awake/alert Behavior During Therapy: WFL for tasks assessed/performed Overall Cognitive Status: Within Functional Limits for tasks assessed  General Comments: AOx4                     Pertinent Vitals/ Pain       Pain Assessment: No/denies pain         Frequency  Min 2X/week        Progress Toward Goals  OT Goals(current goals can now be found in the care plan section)  Progress towards OT goals: Progressing toward goals  Acute Rehab OT Goals Patient Stated Goal: to get better OT Goal Formulation: With patient Time For Goal Achievement: 12/21/20 Potential to  Achieve Goals: Good  Plan Discharge plan remains appropriate;Frequency remains appropriate       AM-PAC OT "6 Clicks" Daily Activity     Outcome Measure     Help from another person taking care of personal grooming?: None Help from another person toileting, which includes using toliet, bedpan, or urinal?: None Help from another person bathing (including washing, rinsing, drying)?: None Help from another person to put on and taking off regular upper body clothing?: None Help from another person to put on and taking off regular lower body clothing?: None 6 Click Score: 20    End of Session Equipment Utilized During Treatment: Oxygen (2Ls)  OT Visit Diagnosis: Unsteadiness on feet (R26.81);Muscle weakness (generalized) (M62.81);History of falling (Z91.81)   Activity Tolerance Patient tolerated treatment well   Patient Left in bed;with call bell/phone within reach;with bed alarm set   Nurse Communication Mobility status        Time: 3524-8185 OT Time Calculation (min): 27 min  Charges: OT General Charges $OT Visit: 1 Visit OT Treatments $Therapeutic Activity: 23-37 mins  Darleen Crocker, MS, OTR/L , CBIS ascom [phone]  12/12/20, 12:19 PM

## 2020-12-13 DIAGNOSIS — Z6837 Body mass index (BMI) 37.0-37.9, adult: Secondary | ICD-10-CM | POA: Diagnosis not present

## 2020-12-13 DIAGNOSIS — J441 Chronic obstructive pulmonary disease with (acute) exacerbation: Secondary | ICD-10-CM | POA: Insufficient documentation

## 2020-12-13 DIAGNOSIS — F419 Anxiety disorder, unspecified: Secondary | ICD-10-CM | POA: Insufficient documentation

## 2020-12-13 DIAGNOSIS — J9611 Chronic respiratory failure with hypoxia: Secondary | ICD-10-CM | POA: Insufficient documentation

## 2020-12-13 DIAGNOSIS — E662 Morbid (severe) obesity with alveolar hypoventilation: Secondary | ICD-10-CM | POA: Diagnosis not present

## 2020-12-20 DIAGNOSIS — J449 Chronic obstructive pulmonary disease, unspecified: Secondary | ICD-10-CM | POA: Diagnosis not present

## 2020-12-20 DIAGNOSIS — G4730 Sleep apnea, unspecified: Secondary | ICD-10-CM | POA: Diagnosis not present

## 2020-12-20 DIAGNOSIS — R0609 Other forms of dyspnea: Secondary | ICD-10-CM | POA: Diagnosis not present

## 2020-12-20 DIAGNOSIS — R942 Abnormal results of pulmonary function studies: Secondary | ICD-10-CM | POA: Diagnosis not present

## 2020-12-21 ENCOUNTER — Other Ambulatory Visit: Payer: Self-pay | Admitting: Internal Medicine

## 2020-12-21 ENCOUNTER — Other Ambulatory Visit: Payer: Self-pay | Admitting: Family Medicine

## 2020-12-21 DIAGNOSIS — I10 Essential (primary) hypertension: Secondary | ICD-10-CM

## 2020-12-21 DIAGNOSIS — F410 Panic disorder [episodic paroxysmal anxiety] without agoraphobia: Secondary | ICD-10-CM

## 2020-12-21 NOTE — Telephone Encounter (Signed)
Requested Prescriptions  Pending Prescriptions Disp Refills  . clonazePAM (KLONOPIN) 1 MG tablet [Pharmacy Med Name: clonazepam 1 mg tablet] 75 tablet 0    Sig: TAKE ONE TABLET BY MOUTH IN THE MORNING, 1/2 TABLET IN THE AFTERNOON & ONE TABLET AT BEDTIME     Not Delegated - Psychiatry:  Anxiolytics/Hypnotics Failed - 12/21/2020  4:16 PM      Failed - This refill cannot be delegated      Failed - Urine Drug Screen completed in last 360 days      Passed - Valid encounter within last 6 months    Recent Outpatient Visits          4 months ago Peripheral edema   [facility] [place], Coralie Keens, NP   8 months ago COPD (chronic obstructive pulmonary disease) with chronic bronchitis ([place])   [facility] Olin Hauser, DO   1 year ago Right arm pain   [facility], Lupita Raider, FNP   1 year ago Adult hypothyroidism   [facility], Lupita Raider, FNP   1 year ago Need for Streptococcus pneumoniae vaccination   [facility], Lupita Raider, FNP      Future Appointments            In 3 months [facility], [place]            . lisinopril (ZESTRIL) 10 MG tablet [Pharmacy Med Name: lisinopril 10 mg tablet] 30 tablet 0    Sig: TAKE ONE TABLET BY MOUTH ONCE DAILY     Cardiovascular:  ACE Inhibitors Passed - 12/21/2020  4:16 PM      Passed - Cr in normal range and within 180 days    Creat  Date Value Ref Range Status  07/28/2020 1.00 (H) 0.60 - 0.93 mg/dL Final    Comment:    For patients >66 years of age, the reference limit for Creatinine is approximately 13% higher for people identified as African-American. .    Creatinine, Ser  Date Value Ref Range Status  12/09/2020 0.62 0.44 - 1.00 mg/dL Final         Passed - K in normal range and within 180 days    Potassium  Date Value Ref Range Status  12/03/2020 3.6 3.5 - 5.1 mmol/L Final  02/02/2013 3.7 3.5 - 5.1 mmol/L Final          Passed - Patient is not pregnant      Passed - Last BP in normal range    BP Readings from Last 1 Encounters:  12/12/20 120/63         Passed - Valid encounter within last 6 months    Recent Outpatient Visits          4 months ago Peripheral edema   [facility], [place] W, NP   8 months ago COPD (chronic obstructive pulmonary disease) with chronic bronchitis ([place])   [facility] Olin [facility], DO   1 year ago Right arm pain   [facility], Lupita Raider, FNP   1 year ago Adult hypothyroidism   [facility], Lupita Raider, FNP   1 year ago Need for Streptococcus pneumoniae vaccination   [facility], Lupita Raider, FNP      Future Appointments            In 3 months [place] busPIRone (BUSPAR) 7.5 MG tablet [Pharmacy Med Name: buspirone 7.5 mg tablet] 90 tablet 0    Sig: TAKE ONE TABLET BY MOUTH THREE TIMES DAILY     Psychiatry: Anxiolytics/Hypnotics - Non-controlled Passed - 12/21/2020  4:16 PM      Passed - Valid encounter within last 6 months    Recent Outpatient Visits          4 months ago Peripheral edema   [facility] [place], [place] W, NP   8 months ago COPD (chronic obstructive pulmonary disease) with chronic bronchitis ([place])   [facility] O[facility], DO   1 year ago Right arm pain   [facility], Lupita Raider, FNP   1 year ago Adult hypothyroidism   [facility], Lupita Raider, FNP   1 year ago Need for Streptococcus pneumoniae vaccination   [facility], Lupita Raider, FNP      Future Appointments            In 3 months [facility]

## 2020-12-21 NOTE — Telephone Encounter (Signed)
Requested Prescriptions  Pending Prescriptions Disp Refills  . diltiazem (CARDIZEM CD) 120 MG 24 hr capsule [Pharmacy Med Name: diltiazem CD 120 mg capsule,extended release 24 hr] 90 capsule 0    Sig: TAKE ONE CAPSULE BY MOUTH ONCE DAILY     Cardiovascular:  Calcium Channel Blockers Passed - 12/21/2020  4:16 PM      Passed - Last BP in normal range    BP Readings from Last 1 Encounters:  12/12/20 120/63         Passed - Valid encounter within last 6 months    Recent Outpatient Visits          4 months ago Peripheral edema   [facility] [place], [place], NP   8 months ago COPD (chronic obstructive pulmonary disease) with chronic bronchitis ([place])   [facility], DO   1 year ago Right arm pain   [place], FNP   1 year ago Adult hypothyroidism   [facility], Lupita Raider, FNP   1 year ago Need for Streptococcus pneumoniae vaccination   [facility], Lupita Raider, FNP      Future Appointments            In 3 months [facility]            . carvedilol (COREG) 3.125 MG tablet [Pharmacy Med Name: carvedilol 3.125 mg tablet] 180 tablet 0    Sig: TAKE ONE TABLET BY MOUTH TWICE DAILY TAKE WITH A MEAL     Cardiovascular:  Beta Blockers Passed - 12/21/2020  4:16 PM      Passed - Last BP in normal range    BP Readings from Last 1 Encounters:  12/12/20 120/63         Passed - Last Heart Rate in normal range    Pulse Readings from Last 1 Encounters:  12/12/20 68         Passed - Valid encounter within last 6 months    Recent Outpatient Visits          4 months ago Peripheral edema   [facility] [place], Coralie Keens, NP   8 months ago COPD (chronic obstructive pulmonary disease) with chronic bronchitis ([place])   [facility]lin [facility]er, DO   1 year ago Right arm pain   [facility], Lupita Raider, FNP   1 year ago Adult hypothyroidism   [facility], Lupita Raider, FNP   1 year ago Need for Streptococcus pneumoniae vaccination   [facility], Lupita Raider, FNP      Future Appointments            In 3 months [facility]

## 2020-12-22 NOTE — Telephone Encounter (Signed)
Requested medication (s) are due for refill today:   Provider to review it's from another provider  Requested medication (s) are on the active medication list:   Yes  Future visit scheduled:   Yes   Last ordered: 12/12/2020 #15, 0 refills  Non delegated refill  Duplicate request sent from pharmacy   Requested Prescriptions  Pending Prescriptions Disp Refills   clonazePAM ([place]) 1 MG tablet [Pharmacy Med Name: clonazepam 1 mg tablet] 75 tablet 0    Sig: TAKE ONE TABLET BY MOUTH IN THE MORNING, 1/2 TABLET IN THE AFTERNOON & ONE TABLET AT BEDTIME     Not Delegated - Psychiatry:  Anxiolytics/Hypnotics Failed - 12/21/2020  4:16 PM      Failed - This refill cannot be delegated      Failed - Urine Drug Screen completed in last 360 days      Passed - Valid encounter within last 6 months    Recent Outpatient Visits           5 months ago Peripheral edema   [facility] [place], Coralie Keens, NP   8 months ago COPD (chronic obstructive pulmonary disease) with chronic bronchitis ([place])   [facility], DO   1 year ago Right arm pain   [facility], Lupita Raider, FNP   1 year ago Adult hypothyroidism   [facility], Lupita Raider, FNP   1 year ago Need for Streptococcus pneumoniae vaccination   [facility], Lupita Raider, FNP       Future Appointments             In 3 months [facility]             Signed Prescriptions Disp Refills   lisinopril (ZESTRIL) 10 MG tablet 90 tablet 0    Sig: TAKE ONE TABLET BY MOUTH ONCE DAILY     Cardiovascular:  ACE Inhibitors Passed - 12/21/2020  4:16 PM      Passed - Cr in normal range and within 180 days    Creat  Date Value Ref Range Status  07/28/2020 1.00 (H) 0.60 - 0.93 mg/dL Final    Comment:    For patients >55 years of age, the reference limit for Creatinine is approximately 13% higher for people identified  as African-American. .    Creatinine, Ser  Date Value Ref Range Status  12/09/2020 0.62 0.44 - 1.00 mg/dL Final          Passed - K in normal range and within 180 days    Potassium  Date Value Ref Range Status  12/03/2020 3.6 3.5 - 5.1 mmol/L Final  02/02/2013 3.7 3.5 - 5.1 mmol/L Final          Passed - Patient is not pregnant      Passed - Last BP in normal range    BP Readings from Last 1 Encounters:  12/12/20 120/63          Passed - Valid encounter within last 6 months    Recent Outpatient Visits           5 months ago Peripheral edema   [facility] [place], Coralie Keens, NP   8 months ago COPD (chronic obstructive pulmonary disease) with chronic bronchitis ([place])   [place], DO   1 year ago Right arm pain   [facility]  [place], Lupita Raider, FNP   1 year ago Adult hypothyroidism   [facility], Lupita Raider, FNP   1 year ago Need for Streptococcus pneumoniae vaccination   [facility], Lupita Raider, FNP       Future Appointments             In 3 months [facility], [place]              busPIRone (BUSPAR) 7.5 MG tablet 90 tablet 0    Sig: TAKE ONE TABLET BY MOUTH THREE TIMES DAILY     Psychiatry: Anxiolytics/Hypnotics - Non-controlled Passed - 12/21/2020  4:16 PM      Passed - Valid encounter within last 6 months    Recent Outpatient Visits           5 months ago Peripheral edema   [facility], [place] W, NP   8 months ago COPD (chronic obstructive pulmonary disease) with chronic bronchitis ([place])   [place], DO   1 year ago Right arm pain   [facility], Lupita Raider, FNP   1 year ago Adult hypothyroidism   [facility], Lupita Raider, FNP   1 year ago Need for Streptococcus pneumoniae vaccination   [facility],  Lupita Raider, FNP       Future Appointments             In 3 months [facility]

## 2021-01-05 DIAGNOSIS — L400 Psoriasis vulgaris: Secondary | ICD-10-CM | POA: Diagnosis not present

## 2021-01-05 DIAGNOSIS — D485 Neoplasm of uncertain behavior of skin: Secondary | ICD-10-CM | POA: Diagnosis not present

## 2021-01-05 DIAGNOSIS — B078 Other viral warts: Secondary | ICD-10-CM | POA: Diagnosis not present

## 2021-01-05 DIAGNOSIS — Z79899 Other long term (current) drug therapy: Secondary | ICD-10-CM | POA: Diagnosis not present

## 2021-01-05 DIAGNOSIS — Z7689 Persons encountering health services in other specified circumstances: Secondary | ICD-10-CM | POA: Diagnosis not present

## 2021-01-06 DIAGNOSIS — Z7689 Persons encountering health services in other specified circumstances: Secondary | ICD-10-CM | POA: Diagnosis not present

## 2021-01-09 LAB — BLOOD GAS, VENOUS
Acid-Base Excess: 8.3 mmol/L — ABNORMAL HIGH (ref 0.0–2.0)
Bicarbonate: 38.5 mmol/L — ABNORMAL HIGH (ref 20.0–28.0)
Patient temperature: 37
pCO2, Ven: 80 mmHg (ref 44.0–60.0)
pH, Ven: 7.29 (ref 7.250–7.430)
pO2, Ven: <31 mmHg — CL (ref 32.0–45.0)

## 2021-01-11 ENCOUNTER — Ambulatory Visit (INDEPENDENT_AMBULATORY_CARE_PROVIDER_SITE_OTHER): Payer: PPO | Admitting: Internal Medicine

## 2021-01-11 ENCOUNTER — Encounter: Payer: Self-pay | Admitting: Internal Medicine

## 2021-01-11 ENCOUNTER — Other Ambulatory Visit: Payer: Self-pay

## 2021-01-11 VITALS — BP 119/45 | HR 80 | Temp 97.7°F | Resp 18 | Ht 63.0 in | Wt 196.8 lb

## 2021-01-11 DIAGNOSIS — J069 Acute upper respiratory infection, unspecified: Secondary | ICD-10-CM | POA: Diagnosis not present

## 2021-01-11 DIAGNOSIS — J410 Simple chronic bronchitis: Secondary | ICD-10-CM | POA: Diagnosis not present

## 2021-01-11 NOTE — Progress Notes (Signed)
Subjective:    Patient ID: [name], female    DOB: Feb 22, 1950, 70 y.o.   MRN: [id number]  HPI  Pt presents to the clinic today with c/o runny nose and cough. This started 3 days ago. She is blowing clear mucous out of her nose. The cough is mostly non productive. She denies headache, nasal congestion, ear pain, sore throat, shortness of breath, nausea, vomiting or diarrhea. She denies fever, chills or body aches. She has a history of COPD with CHRF, managed on Breao and Albuterol.  She has not had sick contacts that she is aware of.  She has not had her flu shot this year.  She did not get any COVID vaccines.  Review of Systems     Past Medical History:  Diagnosis Date   Anxiety    Arthritis    Chronic back pain    COPD (chronic obstructive pulmonary disease) (HCC)    Depression    GERD (gastroesophageal reflux disease)    Hypertension    Hypothyroidism    Pneumonia    RECENT X 3   PONV (postoperative nausea and vomiting)    Psoriasis    Thyroid disease     Current Outpatient Medications  Medication Sig Dispense Refill   acetaminophen (TYLENOL) 500 MG tablet Take 500 mg by mouth every 6 (six) hours as needed for moderate pain.      albuterol (VENTOLIN HFA) 108 (90 Base) MCG/ACT inhaler INHALE 2 PUFFS BY MOUTH INTO THE LUNGS EVERY 6 HOURS AS NEEDED FOR WHEEZING OR SHORTNESS 8.5 g 2   busPIRone (BUSPAR) 7.5 MG tablet TAKE ONE TABLET BY MOUTH THREE TIMES DAILY 90 tablet 0   carvedilol (COREG) 3.125 MG tablet TAKE ONE TABLET BY MOUTH TWICE DAILY TAKE WITH A MEAL 180 tablet 0   citalopram (CELEXA) 40 MG tablet Take 1 tablet (40 mg total) by mouth daily. 90 tablet 0   clonazePAM (KLONOPIN) 1 MG tablet Take 0.5 tablets (0.5 mg total) by mouth 3 (three) times daily as needed for anxiety. TAKE ONE TABLET BY MOUTH EVERY MORNING, ONE-HALF TABLET IN THE AFTERNOON & ONE TABLET AT BEDTIME 15 tablet 0   diltiazem (CARDIZEM CD) 120 MG 24 hr capsule TAKE ONE CAPSULE BY MOUTH ONCE DAILY 90  capsule 0   fluticasone furoate-vilanterol (BREO ELLIPTA) 200-25 MCG/INH AEPB Inhale 1 puff into the lungs daily. 60 each 2   levothyroxine (SYNTHROID) 175 MCG tablet Take 1 tablet (175 mcg total) by mouth daily. 90 tablet 2   lisinopril (ZESTRIL) 10 MG tablet TAKE ONE TABLET BY MOUTH ONCE DAILY 90 tablet 0   Misc. Devices (QUAD CANE/SMALL BASE) MISC 1 Device by Does not apply route daily. 1 each 0   pravastatin (PRAVACHOL) 10 MG tablet Take 1 tablet (10 mg total) by mouth daily. 90 tablet 0   Guselkumab (TREMFYA [place]) Inject into the skin every 30 (thirty) days. (Patient not taking: Reported on 01/11/2021)     ipratropium-albuterol (DUONEB) 0.5-2.5 (3) MG/3ML SOLN Take 3 mLs by nebulization every 6 (six) hours as needed. (Patient not taking: Reported on 01/11/2021) 360 mL    No current facility-administered medications for this visit.    Allergies  Allergen Reactions   Ciprofloxacin Hives and Diarrhea   Mucinex [Guaifenesin Er] Nausea Only    Per pt   Oxycodone Diarrhea   Oxycodone-Acetaminophen Diarrhea   Oxycodone-Acetaminophen Diarrhea   Sulfa Antibiotics Rash    Throat, mouth Throat, mouth    Family History  Problem Relation  Age of Onset   Heart disease Mother    Breast cancer Mother    Heart attack Mother    COPD Father    Stroke Paternal Uncle    Kidney disease Sister    Breast cancer Cousin    Breast cancer Cousin    Heart attack Maternal Aunt    Stroke Paternal Grandfather    Kidney cancer Neg Hx    Bladder Cancer Neg Hx     Social History   Socioeconomic History   Marital status: Widowed    Spouse name: Not on file   Number of children: Not on file   Years of education: Not on file   Highest education level: Not on file  Occupational History   Occupation: retired  Tobacco Use   Smoking status: Every Day    Packs/day: 1.00    Years: 35.00    Pack years: 35.00    Types: Cigarettes    Start date: 04/19/2018   Smokeless tobacco: Never   Tobacco comments:     Previously smoked 2 ppd.   Vaping Use   Vaping Use: Never used  Substance and Sexual Activity   Alcohol use: No   Drug use: No   Sexual activity: Not Currently    Birth control/protection: Surgical  Other Topics Concern   Not on file  Social History Narrative   Not on file   Social Determinants of Health   Financial Resource Strain: Low Risk    Difficulty of Paying Living Expenses: Not hard at all  Food Insecurity: No Food Insecurity   Worried About Charity fundraiser in the Last Year: Never true   [place] in the Last Year: Never true  Transportation Needs: No Transportation Needs   Lack of Transportation (Medical): No   Lack of Transportation (Non-Medical): No  Physical Activity: Inactive   Days of Exercise per Week: 0 days   Minutes of Exercise per Session: 0 min  Stress: No Stress Concern Present   Feeling of Stress : Not at all  Social Connections: Not on file  Intimate Partner Violence: Not on file     Constitutional: Denies fever, malaise, fatigue, headache or abrupt weight changes.  HEENT: Pt reports runny nose. Denies eye pain, eye redness, ear pain, ringing in the ears, wax buildup, runny nose, nasal congestion, bloody nose, or sore throat. Respiratory: Pt reports cough. Denies difficulty breathing, shortness of breath, or sputum production.   Cardiovascular: Denies chest pain, chest tightness, palpitations or swelling in the hands or feet.  Gastrointestinal: Denies abdominal pain, bloating, constipation, diarrhea or blood in the stool.   No other specific complaints in a complete review of systems (except as listed in HPI above).  Objective:   Physical Exam  BP (!) 119/45 (BP Location: Right Arm, Patient Position: Sitting, Cuff Size: Normal)   Pulse 80   Temp 97.7 F (36.5 C) (Temporal)   Resp 18   Ht 5\' 3"  (1.6 m)   Wt 196 lb 12.8 oz (89.3 kg)   SpO2 95%   BMI 34.86 kg/m   Wt Readings from Last 3 Encounters:  12/02/20 200 lb 9.9 oz (91 kg)   07/25/20 210 lb 9.6 oz (95.5 kg)  04/12/20 205 lb (93 kg)    General: Appears her stated age, obese, in NAD. HEENT: Head: normal shape and size; Eyes: sclera white and EOMs intact; Neck: No adenopathy noted. Cardiovascular: Normal rate and rhythm.  Pulmonary/Chest: Normal effort and positive vesicular breath sounds.  No respiratory distress. No wheezes, rales or ronchi noted.  Neurological: Alert and oriented.   BMET    Component Value Date/Time   NA 138 12/03/2020 0607   NA 142 06/24/2015 1102   NA 135 (L) 02/02/2013 1021   K 3.6 12/03/2020 0607   K 3.7 02/02/2013 1021   CL 93 (L) 12/03/2020 0607   CL 103 02/02/2013 1021   CO2 38 (H) 12/03/2020 0607   CO2 30 02/02/2013 1021   GLUCOSE 171 (H) 12/03/2020 0607   GLUCOSE 152 (H) 02/02/2013 1021   BUN 14 12/03/2020 0607   BUN 12 06/24/2015 1102   BUN 8 02/02/2013 1021   CREATININE 0.62 12/09/2020 0701   CREATININE 1.00 (H) 07/28/2020 1004   CALCIUM 8.8 (L) 12/03/2020 0607   CALCIUM 9.9 02/02/2013 1021   GFRNONAA >60 12/09/2020 0701   GFRNONAA 72 12/03/2019 0947   GFRAA 84 12/03/2019 0947    Lipid Panel     Component Value Date/Time   CHOL 197 07/28/2020 1004   CHOL 205 (H) 02/03/2015 1022   TRIG 205 (H) 07/28/2020 1004   HDL 41 (L) 07/28/2020 1004   HDL 36 (L) 02/03/2015 1022   CHOLHDL 4.8 07/28/2020 1004   LDLCALC 124 (H) 07/28/2020 1004    CBC    Component Value Date/Time   WBC 7.4 12/03/2020 0607   RBC 5.02 12/03/2020 0607   HGB 14.6 12/03/2020 0607   HGB 15.6 06/24/2015 1102   HCT 46.9 (H) 12/03/2020 0607   HCT 46.6 06/24/2015 1102   PLT 210 12/03/2020 0607   PLT 307 06/24/2015 1102   MCV 93.4 12/03/2020 0607   MCV 93 06/24/2015 1102   MCV 90 02/02/2013 1021   MCH 29.1 12/03/2020 0607   MCHC 31.1 12/03/2020 0607   RDW 14.3 12/03/2020 0607   RDW 13.3 06/24/2015 1102   RDW 13.2 02/02/2013 1021   LYMPHSABS 2.2 12/02/2020 1612   LYMPHSABS 2.3 06/24/2015 1102   MONOABS 0.8 12/02/2020 1612   EOSABS  0.2 12/02/2020 1612   EOSABS 0.3 06/24/2015 1102   BASOSABS 0.1 12/02/2020 1612   BASOSABS 0.1 06/24/2015 1102    Hgb A1C Lab Results  Component Value Date   HGBA1C 6.1 (H) 07/28/2020           Assessment & Plan:   Viral URI with Cough:  Encourage rest and fluids Start Allegra 180 mg 2 times daily for 3 days then daily thereafter until you are feeling better Can take Delsym OTC as needed for cough  Return precautions discussed  [name], NP This visit occurred during the SARS-CoV-2 public health emergency.  Safety protocols were in place, including screening questions prior to the visit, additional usage of staff PPE, and extensive cleaning of exam room while observing appropriate contact time as indicated for disinfecting solutions.

## 2021-01-11 NOTE — Patient Instructions (Signed)

## 2021-01-17 ENCOUNTER — Ambulatory Visit: Payer: PPO | Admitting: Internal Medicine

## 2021-01-18 ENCOUNTER — Telehealth: Payer: Self-pay

## 2021-01-18 NOTE — Telephone Encounter (Signed)
Copied from [place] [phone]. Topic: General - Other >> Jan 13, 2021  4:13 PM McGill, Nelva Bush wrote: Home Health Verbal Orders - Caller/Agency: [facility] Number: [phone] Requesting OT/PT/Skilled Nursing/Social Work/Speech Therapy: PT Frequency: pt declined PT

## 2021-01-18 NOTE — Telephone Encounter (Signed)
noted 

## 2021-01-23 ENCOUNTER — Other Ambulatory Visit: Payer: Self-pay | Admitting: Internal Medicine

## 2021-01-23 DIAGNOSIS — F410 Panic disorder [episodic paroxysmal anxiety] without agoraphobia: Secondary | ICD-10-CM

## 2021-01-23 NOTE — Telephone Encounter (Signed)
Pt called in to follow up on the status of her Rx refill request below, please advise.

## 2021-01-23 NOTE — Telephone Encounter (Signed)
Requested medication (s) are due for refill today: NO  Requested medication (s) are on the active medication list: NO, dose/signature inconsistent  Last refill:  11/19/20  Future visit scheduled: tomorrow, 01/24/21  Notes to clinic:  prescriber not in this practice, dose and signature inconsistent with current dose, not delegated, appt tomorrow, 01/24/21, please assess.   Requested Prescriptions  Pending Prescriptions Disp Refills   clonazePAM (KLONOPIN) 1 MG tablet [Pharmacy Med Name: clonazepam 1 mg tablet] 75 tablet 0    Sig: TAKE ONE TABLET BY MOUTH IN THE MORNING, 1/2 TABLET IN THE AFTERNOON & ONE TABLET AT BEDTIME     Not Delegated - Psychiatry:  Anxiolytics/Hypnotics Failed - 01/23/2021  1:40 PM      Failed - This refill cannot be delegated      Failed - Urine Drug Screen completed in last 360 days      Passed - Valid encounter within last 6 months    Recent Outpatient Visits           1 week ago Viral URI with cough   [facility] [place], Coralie Keens, NP   6 months ago Peripheral edema   [facility] [place], [place], NP   9 months ago COPD (chronic obstructive pulmonary disease) with chronic bronchitis ([place])   [facility] [place] Hauser, DO   1 year ago Right arm pain   [facility], Lupita Raider, FNP   1 year ago Adult hypothyroidism   [facility], Lupita Raider, FNP       Future Appointments             In 2 months [facility], [facility]

## 2021-01-24 ENCOUNTER — Encounter: Payer: Self-pay | Admitting: Internal Medicine

## 2021-01-24 ENCOUNTER — Ambulatory Visit (INDEPENDENT_AMBULATORY_CARE_PROVIDER_SITE_OTHER): Payer: PPO | Admitting: Internal Medicine

## 2021-01-24 DIAGNOSIS — Z6837 Body mass index (BMI) 37.0-37.9, adult: Secondary | ICD-10-CM

## 2021-01-24 DIAGNOSIS — I700 Atherosclerosis of aorta: Secondary | ICD-10-CM | POA: Diagnosis not present

## 2021-01-24 DIAGNOSIS — I6523 Occlusion and stenosis of bilateral carotid arteries: Secondary | ICD-10-CM

## 2021-01-24 DIAGNOSIS — L409 Psoriasis, unspecified: Secondary | ICD-10-CM | POA: Diagnosis not present

## 2021-01-24 DIAGNOSIS — N1831 Chronic kidney disease, stage 3a: Secondary | ICD-10-CM | POA: Diagnosis not present

## 2021-01-24 DIAGNOSIS — F5105 Insomnia due to other mental disorder: Secondary | ICD-10-CM

## 2021-01-24 DIAGNOSIS — F410 Panic disorder [episodic paroxysmal anxiety] without agoraphobia: Secondary | ICD-10-CM | POA: Diagnosis not present

## 2021-01-24 DIAGNOSIS — M159 Polyosteoarthritis, unspecified: Secondary | ICD-10-CM

## 2021-01-24 DIAGNOSIS — R7303 Prediabetes: Secondary | ICD-10-CM | POA: Diagnosis not present

## 2021-01-24 DIAGNOSIS — E039 Hypothyroidism, unspecified: Secondary | ICD-10-CM | POA: Diagnosis not present

## 2021-01-24 DIAGNOSIS — I70219 Atherosclerosis of native arteries of extremities with intermittent claudication, unspecified extremity: Secondary | ICD-10-CM

## 2021-01-24 DIAGNOSIS — F32A Depression, unspecified: Secondary | ICD-10-CM

## 2021-01-24 DIAGNOSIS — E782 Mixed hyperlipidemia: Secondary | ICD-10-CM | POA: Diagnosis not present

## 2021-01-24 DIAGNOSIS — I10 Essential (primary) hypertension: Secondary | ICD-10-CM

## 2021-01-24 DIAGNOSIS — F419 Anxiety disorder, unspecified: Secondary | ICD-10-CM | POA: Diagnosis not present

## 2021-01-24 DIAGNOSIS — J432 Centrilobular emphysema: Secondary | ICD-10-CM | POA: Diagnosis not present

## 2021-01-24 DIAGNOSIS — F99 Mental disorder, not otherwise specified: Secondary | ICD-10-CM

## 2021-01-24 MED ORDER — FLUTICASONE FUROATE-VILANTEROL 100-25 MCG/ACT IN AEPB: 1.0000 | INHALATION_SPRAY | Freq: Every day | RESPIRATORY_TRACT | 11 refills | Status: DC

## 2021-01-24 NOTE — Progress Notes (Signed)
Virtual Visit via Telephone Note  I connected with [name] on 01/24/21 at  4:00 PM EST by telephone and verified that I am speaking with the correct person using two identifiers.  Location: Patient: Home Provider: Office  Person's participating in this telephone visit: [name], NP and [name].   I discussed the limitations, risks, security and privacy concerns of performing an evaluation and management service by telephone and the availability of in person appointments. I also discussed with the patient that there may be a patient responsible charge related to this service. The patient expressed understanding and agreed to proceed.   History of Present Illness:  Pt due for follow up of chronic conditions.  HTN: Her BP at home runs 120/70. She is taking Carvedilol, Diltiazem and Lisinopril as prescribed. ECG from 11/2020 reviewed.  HLD with Carotid/Thoracic Atherosclerosis, PAD with Claudication: Her last LDL was 124 ,triglycerides 205, 07/2020. She denies myalgias on Pravastatin. She foes not consume a low fat diet. She follows with vascular.  CKD 3: Her las creatinine was 0.62, GFR > 60, 12/2020. She is on Lisinopril for renal protection. She does not follow with nephrology.  COPD: She reports chronic cough and shortness of breath. She is taking Breo and Albuterol as prescribed. There are no PFT's on file. She has quit smoking. She does not follow with pulmonology.  OA: Mainly in her back and hips. She takes Tylenol OTC with minimal relief of symptoms. She does not follow with orthopedics.  Depression, Anxiety and Panic Attacks: Chronic, managed on Citalopram, Buspirone, Hydroxyzine and Clonazepam. She is not seeing a therapist or psychiatry. She denies SI/HI.  Insomnia: She has difficulty staying asleep. She take Clonazepam at bedtime with good relief of symptoms. There is no sleep study on file.  Prediabetes: Her last A1C was 6.1%, 07/2020. She is not taking any oral  diabetic medication at this time. She does not check her sugars.  Hypothyroidism: She denies any issues on her current dose of Levothyroxine. She does not follow with endocrinology.  Psoriasis: Managed on Tremfya. She follows with dermatology.    Past Medical History:  Diagnosis Date   Anxiety    Arthritis    Chronic back pain    COPD (chronic obstructive pulmonary disease) (HCC)    Depression    GERD (gastroesophageal reflux disease)    Hypertension    Hypothyroidism    Pneumonia    RECENT X 3   PONV (postoperative nausea and vomiting)    Psoriasis    Thyroid disease     Current Outpatient Medications  Medication Sig Dispense Refill   acetaminophen (TYLENOL) 500 MG tablet Take 500 mg by mouth every 6 (six) hours as needed for moderate pain.      albuterol (VENTOLIN HFA) 108 (90 Base) MCG/ACT inhaler INHALE 2 PUFFS BY MOUTH INTO THE LUNGS EVERY 6 HOURS AS NEEDED FOR WHEEZING OR SHORTNESS 8.5 g 2   busPIRone (BUSPAR) 7.5 MG tablet TAKE ONE TABLET BY MOUTH THREE TIMES DAILY 90 tablet 0   carvedilol (COREG) 3.125 MG tablet TAKE ONE TABLET BY MOUTH TWICE DAILY TAKE WITH A MEAL 180 tablet 0   citalopram (CELEXA) 40 MG tablet Take 1 tablet (40 mg total) by mouth daily. 90 tablet 0   clonazePAM (KLONOPIN) 1 MG tablet TAKE ONE TABLET BY MOUTH IN THE MORNING, 1/2 TABLET IN THE AFTERNOON & ONE TABLET AT BEDTIME 75 tablet 0   diltiazem (CARDIZEM CD) 120 MG 24 hr capsule TAKE ONE CAPSULE  BY MOUTH ONCE DAILY 90 capsule 0   fluticasone furoate-vilanterol (BREO ELLIPTA) 200-25 MCG/INH AEPB Inhale 1 puff into the lungs daily. 60 each 2   Guselkumab (TREMFYA [place]) Inject into the skin every 30 (thirty) days. (Patient not taking: Reported on 01/11/2021)     ipratropium-albuterol (DUONEB) 0.5-2.5 (3) MG/3ML SOLN Take 3 mLs by nebulization every 6 (six) hours as needed. (Patient not taking: Reported on 01/11/2021) 360 mL    levothyroxine (SYNTHROID) 175 MCG tablet Take 1 tablet (175 mcg total) by mouth  daily. 90 tablet 2   lisinopril (ZESTRIL) 10 MG tablet TAKE ONE TABLET BY MOUTH ONCE DAILY 90 tablet 0   Misc. Devices (QUAD CANE/SMALL BASE) MISC 1 Device by Does not apply route daily. 1 each 0   pravastatin (PRAVACHOL) 10 MG tablet Take 1 tablet (10 mg total) by mouth daily. 90 tablet 0   No current facility-administered medications for this visit.    Allergies  Allergen Reactions   Ciprofloxacin Hives and Diarrhea   Mucinex [Guaifenesin Er] Nausea Only    Per pt   Oxycodone Diarrhea   Oxycodone-Acetaminophen Diarrhea   Oxycodone-Acetaminophen Diarrhea   Sulfa Antibiotics Rash    Throat, mouth Throat, mouth    Family History  Problem Relation Age of Onset   Heart disease Mother    Breast cancer Mother    Heart attack Mother    COPD Father    Stroke Paternal Uncle    Kidney disease Sister    Breast cancer Cousin    Breast cancer Cousin    Heart attack Maternal Aunt    Stroke Paternal Grandfather    Kidney cancer Neg Hx    Bladder Cancer Neg Hx     Social History   Socioeconomic History   Marital status: Widowed    Spouse name: Not on file   Number of children: Not on file   Years of education: Not on file   Highest education level: Not on file  Occupational History   Occupation: retired  Tobacco Use   Smoking status: Former    Packs/day: 1.00    Years: 35.00    Pack years: 35.00    Types: Cigarettes    Start date: 04/19/2018    Quit date: 11/16/2020    Years since quitting: 0.1   Smokeless tobacco: Never   Tobacco comments:    Previously smoked 2 ppd.   Vaping Use   Vaping Use: Never used  Substance and Sexual Activity   Alcohol use: No   Drug use: No   Sexual activity: Not Currently    Birth control/protection: Surgical  Other Topics Concern   Not on file  Social History Narrative   Not on file   Social Determinants of Health   Financial Resource Strain: Low Risk    Difficulty of Paying Living Expenses: Not hard at all  Food Insecurity: No  Food Insecurity   Worried About Charity fundraiser in the Last Year: Never true   [place] in the Last Year: Never true  Transportation Needs: No Transportation Needs   Lack of Transportation (Medical): No   Lack of Transportation (Non-Medical): No  Physical Activity: Inactive   Days of Exercise per Week: 0 days   Minutes of Exercise per Session: 0 min  Stress: No Stress Concern Present   Feeling of Stress : Not at all  Social Connections: Not on file  Intimate Partner Violence: Not on file     Constitutional: Denies fever,  malaise, fatigue, headache or abrupt weight changes.  HEENT: Denies eye pain, eye redness, ear pain, ringing in the ears, wax buildup, runny nose, nasal congestion, bloody nose, or sore throat. Respiratory: Pt reports chronic cough and shortness of breath. Denies difficulty breathing, or sputum production.   Cardiovascular: Denies chest pain, chest tightness, palpitations or swelling in the hands or feet.  Gastrointestinal: Denies abdominal pain, bloating, constipation, diarrhea or blood in the stool.  GU: Denies urgency, frequency, pain with urination, burning sensation, blood in urine, odor or discharge. Musculoskeletal: Pt reports chronic joint pain. Denies decrease in range of motion, difficulty with gait, muscle pain or joint swelling.  Skin: Pt reports psoriasis. Denies lesions or ulcercations.  Neurological: Pt reports insomnia. Denies dizziness, difficulty with memory, difficulty with speech or problems with balance and coordination.  Psych: Pt has a history of anxiety and depression. Denies  SI/HI.  No other specific complaints in a complete review of systems (except as listed in HPI above).  Observations/Objective:   Wt Readings from Last 3 Encounters:  01/11/21 196 lb 12.8 oz (89.3 kg)  12/02/20 200 lb 9.9 oz (91 kg)  07/25/20 210 lb 9.6 oz (95.5 kg)    General: In NAD. Pulmonary/Chest: Normal effort. No respiratory distress.  Neuro:  Alert and oriented. Psychiatric: Mood. Behavior is normal. Judgment and thought content normal.     BMET    Component Value Date/Time   NA 138 12/03/2020 0607   NA 142 06/24/2015 1102   NA 135 (L) 02/02/2013 1021   K 3.6 12/03/2020 0607   K 3.7 02/02/2013 1021   CL 93 (L) 12/03/2020 0607   CL 103 02/02/2013 1021   CO2 38 (H) 12/03/2020 0607   CO2 30 02/02/2013 1021   GLUCOSE 171 (H) 12/03/2020 0607   GLUCOSE 152 (H) 02/02/2013 1021   BUN 14 12/03/2020 0607   BUN 12 06/24/2015 1102   BUN 8 02/02/2013 1021   CREATININE 0.62 12/09/2020 0701   CREATININE 1.00 (H) 07/28/2020 1004   CALCIUM 8.8 (L) 12/03/2020 0607   CALCIUM 9.9 02/02/2013 1021   GFRNONAA >60 12/09/2020 0701   GFRNONAA 72 12/03/2019 0947   GFRAA 84 12/03/2019 0947    Lipid Panel     Component Value Date/Time   CHOL 197 07/28/2020 1004   CHOL 205 (H) 02/03/2015 1022   TRIG 205 (H) 07/28/2020 1004   HDL 41 (L) 07/28/2020 1004   HDL 36 (L) 02/03/2015 1022   CHOLHDL 4.8 07/28/2020 1004   LDLCALC 124 (H) 07/28/2020 1004    CBC    Component Value Date/Time   WBC 7.4 12/03/2020 0607   RBC 5.02 12/03/2020 0607   HGB 14.6 12/03/2020 0607   HGB 15.6 06/24/2015 1102   HCT 46.9 (H) 12/03/2020 0607   HCT 46.6 06/24/2015 1102   PLT 210 12/03/2020 0607   PLT 307 06/24/2015 1102   MCV 93.4 12/03/2020 0607   MCV 93 06/24/2015 1102   MCV 90 02/02/2013 1021   MCH 29.1 12/03/2020 0607   MCHC 31.1 12/03/2020 0607   RDW 14.3 12/03/2020 0607   RDW 13.3 06/24/2015 1102   RDW 13.2 02/02/2013 1021   LYMPHSABS 2.2 12/02/2020 1612   LYMPHSABS 2.3 06/24/2015 1102   MONOABS 0.8 12/02/2020 1612   EOSABS 0.2 12/02/2020 1612   EOSABS 0.3 06/24/2015 1102   BASOSABS 0.1 12/02/2020 1612   BASOSABS 0.1 06/24/2015 1102    Hgb A1C Lab Results  Component Value Date   HGBA1C 6.1 (H) 07/28/2020  Assessment and Plan:   Follow Up Instructions:    I discussed the assessment and treatment plan with the  patient. The patient was provided an opportunity to ask questions and all were answered. The patient agreed with the plan and demonstrated an understanding of the instructions.   The patient was advised to call back or seek an in-person evaluation if the symptoms worsen or if the condition fails to improve as anticipated.  I provided 13.26 minutes of non-face-to-face time during this encounter.   [name], NP

## 2021-01-24 NOTE — Assessment & Plan Note (Signed)
Will check CMET and lipid profile with annual exam Encouraged her to consume a low fat diet Continue Pravastatin, Carvedilol, Lisinopril and Diltiazem

## 2021-01-24 NOTE — Assessment & Plan Note (Signed)
Controlled on Diltiazem, Lisinopril and Carvedilol Reinforced DASH diet and exercise for weight loss Will monitor

## 2021-01-24 NOTE — Assessment & Plan Note (Signed)
Continue Breo and Albuterol Congratulated her on smoking cessation

## 2021-01-24 NOTE — Assessment & Plan Note (Signed)
Will check CMET and lipid profile with annual exam Encouraged her to consume a low fat diet Continue Pravastatin

## 2021-01-24 NOTE — Assessment & Plan Note (Signed)
Encouraged diet and exercise for weight loss ?

## 2021-01-24 NOTE — Assessment & Plan Note (Signed)
Continue Tylenol OTC as needed 

## 2021-01-24 NOTE — Assessment & Plan Note (Signed)
Continue Lisinopril for renal protection

## 2021-01-24 NOTE — Assessment & Plan Note (Signed)
Managed on Tremfya She will continue to follow with dermatology.

## 2021-01-24 NOTE — Assessment & Plan Note (Signed)
Will check A1C with annual exam Encouraged low carb diet and exercise for weight loss

## 2021-01-24 NOTE — Assessment & Plan Note (Signed)
Continue Citalopram, Buspirone, Hydroxyzine and Clonazepam Support offered

## 2021-01-24 NOTE — Assessment & Plan Note (Signed)
Will check TSH and Free T4 at annual exam Continue current dose of Levothyroxine at this time

## 2021-01-24 NOTE — Patient Instructions (Signed)
Heart-Healthy Eating Plan Heart-healthy meal planning includes: Eating less unhealthy fats. Eating more healthy fats. Making other changes in your diet. Talk with your doctor or a diet specialist (dietitian) to create an eating plan that is right for you. What is my plan? Your doctor may recommend an eating plan that includes: Total fat: ______% or less of total calories a day. Saturated fat: ______% or less of total calories a day. Cholesterol: less than _________mg a day. What are tips for following this plan? Cooking Avoid frying your food. Try to bake, boil, grill, or broil it instead. You can also reduce fat by: Removing the skin from poultry. Removing all visible fats from meats. Steaming vegetables in water or broth. Meal planning  At meals, divide your plate into four equal parts: Fill one-half of your plate with vegetables and green salads. Fill one-fourth of your plate with whole grains. Fill one-fourth of your plate with lean protein foods. Eat 4-5 servings of vegetables per day. A serving of vegetables is: 1 cup of raw or cooked vegetables. 2 cups of raw leafy greens. Eat 4-5 servings of fruit per day. A serving of fruit is: 1 medium whole fruit.  cup of dried fruit.  cup of fresh, frozen, or canned fruit.  cup of 100% fruit juice. Eat more foods that have soluble fiber. These are apples, broccoli, carrots, beans, peas, and barley. Try to get 20-30 g of fiber per day. Eat 4-5 servings of nuts, legumes, and seeds per week: 1 serving of dried beans or legumes equals  cup after being cooked. 1 serving of nuts is  cup. 1 serving of seeds equals 1 tablespoon. General information Eat more home-cooked food. Eat less restaurant, buffet, and fast food. Limit or avoid alcohol. Limit foods that are high in starch and sugar. Avoid fried foods. Lose weight if you are overweight. Keep track of how much salt (sodium) you eat. This is important if you have high blood  pressure. Ask your doctor to tell you more about this. Try to add vegetarian meals each week. Fats Choose healthy fats. These include olive oil and canola oil, flaxseeds, walnuts, almonds, and seeds. Eat more omega-3 fats. These include salmon, mackerel, sardines, tuna, flaxseed oil, and ground flaxseeds. Try to eat fish at least 2 times each week. Check food labels. Avoid foods with trans fats or high amounts of saturated fat. Limit saturated fats. These are often found in animal products, such as meats, butter, and cream. These are also found in plant foods, such as palm oil, palm kernel oil, and coconut oil. Avoid foods with partially hydrogenated oils in them. These have trans fats. Examples are stick margarine, some tub margarines, cookies, crackers, and other baked goods. What foods can I eat? Fruits All fresh, canned (in natural juice), or frozen fruits. Vegetables Fresh or frozen vegetables (raw, steamed, roasted, or grilled). Green salads. Grains Most grains. Choose whole wheat and whole grains most of the time. Rice and pasta, including brown rice and pastas made with whole wheat. Meats and other proteins Lean, well-trimmed beef, veal, pork, and lamb. Chicken and turkey without skin. All fish and shellfish. Wild duck, rabbit, pheasant, and venison. Egg whites or low-cholesterol egg substitutes. Dried beans, peas, lentils, and tofu. Seeds and most nuts. Dairy Low-fat or nonfat cheeses, including ricotta and mozzarella. Skim or 1% milk that is liquid, powdered, or evaporated. Buttermilk that is made with low-fat milk. Nonfat or low-fat yogurt. Fats and oils Non-hydrogenated (trans-free) margarines. Vegetable oils, including   soybean, sesame, sunflower, olive, peanut, safflower, corn, canola, and cottonseed. Salad dressings or mayonnaise made with a vegetable oil. Beverages Mineral water. Coffee and tea. Diet carbonated beverages. Sweets and desserts Sherbet, gelatin, and fruit ice.  Small amounts of dark chocolate. Limit all sweets and desserts. Seasonings and condiments All seasonings and condiments. The items listed above may not be a complete list of foods and drinks you can eat. Contact a dietitian for more options. What foods should I avoid? Fruits Canned fruit in heavy syrup. Fruit in cream or butter sauce. Fried fruit. Limit coconut. Vegetables Vegetables cooked in cheese, cream, or butter sauce. Fried vegetables. Grains Breads that are made with saturated or trans fats, oils, or whole milk. Croissants. Sweet rolls. Donuts. High-fat crackers, such as cheese crackers. Meats and other proteins Fatty meats, such as hot dogs, ribs, sausage, bacon, rib-eye roast or steak. High-fat deli meats, such as salami and bologna. Caviar. Domestic duck and goose. Organ meats, such as liver. Dairy Cream, sour cream, cream cheese, and creamed cottage cheese. Whole-milk cheeses. Whole or 2% milk that is liquid, evaporated, or condensed. Whole buttermilk. Cream sauce or high-fat cheese sauce. Yogurt that is made from whole milk. Fats and oils Meat fat, or shortening. Cocoa butter, hydrogenated oils, palm oil, coconut oil, palm kernel oil. Solid fats and shortenings, including bacon fat, salt pork, lard, and butter. Nondairy cream substitutes. Salad dressings with cheese or sour cream. Beverages Regular sodas and juice drinks with added sugar. Sweets and desserts Frosting. Pudding. Cookies. Cakes. Pies. Milk chocolate or white chocolate. Buttered syrups. Full-fat ice cream or ice cream drinks. The items listed above may not be a complete list of foods and drinks to avoid. Contact a dietitian for more information. Summary Heart-healthy meal planning includes eating less unhealthy fats, eating more healthy fats, and making other changes in your diet. Eat a balanced diet. This includes fruits and vegetables, low-fat or nonfat dairy, lean protein, nuts and legumes, whole grains, and  heart-healthy oils and fats. This information is not intended to replace advice given to you by your health care provider. Make sure you discuss any questions you have with your health care provider. Document Revised: 06/02/2020 Document Reviewed: 06/02/2020 Elsevier Patient Education  2022 Elsevier Inc.  

## 2021-01-24 NOTE — Assessment & Plan Note (Signed)
Continue Clonazepam for sleep

## 2021-02-09 ENCOUNTER — Other Ambulatory Visit: Payer: Self-pay | Admitting: Internal Medicine

## 2021-02-09 DIAGNOSIS — F410 Panic disorder [episodic paroxysmal anxiety] without agoraphobia: Secondary | ICD-10-CM

## 2021-02-10 NOTE — Telephone Encounter (Signed)
Requested medication (s) are due for refill today: yes  Requested medication (s) are on the active medication list: yes  Last refill:  01/05/21  Future visit scheduled: no, was asked to reschedule in January and there is no upcoming visit, please assess.  Notes to clinic:  does pass protocol of visit in Dec, however, asked to return in one month, please assess.   Requested Prescriptions  Pending Prescriptions Disp Refills   busPIRone (BUSPAR) 7.5 MG tablet [Pharmacy Med Name: buspirone 7.5 mg tablet] 90 tablet 0    Sig: TAKE ONE TABLET BY MOUTH THREE TIMES DAILY     Psychiatry: Anxiolytics/Hypnotics - Non-controlled Passed - 02/09/2021  4:58 PM      Passed - Valid encounter within last 6 months    Recent Outpatient Visits           2 weeks ago Benign essential HTN   [facility] [place], Coralie Keens, NP   1 month ago Viral URI with cough   [facility] [place], Coralie Keens, NP   6 months ago Peripheral edema   [facility] [place], [place], NP   10 months ago COPD (chronic obstructive pulmonary disease) with chronic bronchitis [facility])   [place], DO   1 year ago Right arm pain   [facility], Lupita Raider, FNP       Future Appointments             In 2 months [facility], [facility]

## 2021-02-15 ENCOUNTER — Other Ambulatory Visit: Payer: Self-pay | Admitting: Internal Medicine

## 2021-02-15 DIAGNOSIS — F410 Panic disorder [episodic paroxysmal anxiety] without agoraphobia: Secondary | ICD-10-CM

## 2021-02-15 DIAGNOSIS — F3341 Major depressive disorder, recurrent, in partial remission: Secondary | ICD-10-CM

## 2021-02-15 MED ORDER — CITALOPRAM HYDROBROMIDE 40 MG PO TABS: 40.0000 mg | ORAL_TABLET | Freq: Every day | ORAL | 0 refills | Status: DC

## 2021-02-15 NOTE — Telephone Encounter (Signed)
Requested Prescriptions  Pending Prescriptions Disp Refills   citalopram (CELEXA) 40 MG tablet 90 tablet 0    Sig: Take 1 tablet (40 mg total) by mouth daily.     Psychiatry:  Antidepressants - SSRI Passed - 02/15/2021 10:33 AM      Passed - Completed PHQ-2 or PHQ-9 in the last 360 days      Passed - Valid encounter within last 6 months    Recent Outpatient Visits          3 weeks ago Benign essential HTN   [facility] [place], Coralie Keens, NP   1 month ago Viral URI with cough   [facility] [place], Coralie Keens, NP   6 months ago Peripheral edema   [facility] [place], [place], NP   10 months ago COPD (chronic obstructive pulmonary disease) with chronic bronchitis [facility])   [place], DO   1 year ago Right arm pain   [facility], Lupita Raider, FNP      Future Appointments            In 2 months [facility], [facility]

## 2021-02-15 NOTE — Telephone Encounter (Signed)
Medication Refill - Medication:  citalopram (CELEXA) 40 MG tablet  Has the patient contacted their pharmacy? Yes.   Contact PCP  Preferred Pharmacy (with phone number or street name):  [place], [place] - [address]  [place], [place] [address]  Phone:  [phone]  Fax:  [phone]   Has the patient been seen for an appointment in the last year OR does the patient have an upcoming appointment? Yes.    Agent: Please be advised that RX refills may take up to 3 business days. We ask that you follow-up with your pharmacy.

## 2021-02-15 NOTE — Telephone Encounter (Signed)
Requested Prescriptions  Pending Prescriptions Disp Refills   busPIRone (BUSPAR) 7.5 MG tablet [Pharmacy Med Name: buspirone 7.5 mg tablet] 90 tablet 0    Sig: TAKE ONE TABLET BY MOUTH THREE TIMES DAILY     Psychiatry: Anxiolytics/Hypnotics - Non-controlled Passed - 02/15/2021  4:17 PM      Passed - Valid encounter within last 6 months    Recent Outpatient Visits          3 weeks ago Benign essential HTN   [facility] [place], Coralie Keens, NP   1 month ago Viral URI with cough   [facility] [place], Coralie Keens, NP   6 months ago Peripheral edema   [facility] [place], [place], NP   10 months ago COPD (chronic obstructive pulmonary disease) with chronic bronchitis [facility])   , DO   1 year ago Right arm pain   [facility], Lupita Raider, FNP      Future Appointments            In 2 months [facility], [facility]

## 2021-02-16 ENCOUNTER — Other Ambulatory Visit: Payer: Self-pay

## 2021-02-16 DIAGNOSIS — F410 Panic disorder [episodic paroxysmal anxiety] without agoraphobia: Secondary | ICD-10-CM

## 2021-02-16 DIAGNOSIS — F3341 Major depressive disorder, recurrent, in partial remission: Secondary | ICD-10-CM

## 2021-02-20 ENCOUNTER — Other Ambulatory Visit: Payer: Self-pay | Admitting: Internal Medicine

## 2021-02-20 DIAGNOSIS — F410 Panic disorder [episodic paroxysmal anxiety] without agoraphobia: Secondary | ICD-10-CM

## 2021-02-20 NOTE — Telephone Encounter (Signed)
Requested medication (s) are due for refill today:   Provider to review  Requested medication (s) are on the active medication list:   Yes  Future visit scheduled:   Yes   Last ordered: 01/24/2021 #75, 0 refills  Non delegated refill   Requested Prescriptions  Pending Prescriptions Disp Refills   clonazePAM (KLONOPIN) 1 MG tablet [Pharmacy Med Name: clonazepam 1 mg tablet] 75 tablet 0    Sig: TAKE ONE TABLET BY MOUTH IN THE MORNING, ONE-HALF TABLET IN THE AFTERNOON AND TAKE ONE TABLET BY MOUTH AT BEDTIME     Not Delegated - Psychiatry:  Anxiolytics/Hypnotics Failed - 02/20/2021 10:09 AM      Failed - This refill cannot be delegated      Failed - Urine Drug Screen completed in last 360 days      Passed - Valid encounter within last 6 months    Recent Outpatient Visits           3 weeks ago Benign essential HTN   [place], Coralie Keens, NP   1 month ago Viral URI with cough   [facility] [place], Coralie Keens, NP   7 months ago Peripheral edema   [facility] [place], [place], NP   10 months ago COPD (chronic obstructive pulmonary disease) with chronic bronchitis ([place])   [place], DO   1 year ago Right arm pain   [facility], Lupita Raider, FNP       Future Appointments             In 2 months [facility], [facility]

## 2021-03-10 ENCOUNTER — Other Ambulatory Visit: Payer: Self-pay | Admitting: Internal Medicine

## 2021-03-10 DIAGNOSIS — Z1231 Encounter for screening mammogram for malignant neoplasm of breast: Secondary | ICD-10-CM

## 2021-03-12 ENCOUNTER — Other Ambulatory Visit: Payer: Self-pay | Admitting: Internal Medicine

## 2021-03-12 DIAGNOSIS — F410 Panic disorder [episodic paroxysmal anxiety] without agoraphobia: Secondary | ICD-10-CM

## 2021-03-13 NOTE — Telephone Encounter (Signed)
Requested medication (s) are due for refill today: NO, requesting early  Requested medication (s) are on the active medication list: yes  Last refill:  02/20/21 #75 with 0 RF  Future visit scheduled: 04/21/21  Notes to clinic:  This medication can not be delegated, requesting early, please assess.   Requested Prescriptions  Pending Prescriptions Disp Refills   clonazePAM (KLONOPIN) 1 MG tablet [Pharmacy Med Name: clonazepam 1 mg tablet] 75 tablet 0    Sig: TAKE ONE TABLET BY MOUTH IN THE MORNING, ONE-HALF TABLET IN THE AFTERNOON AND TAKE ONE TABLET BY MOUTH AT BEDTIME     Not Delegated - Psychiatry: Anxiolytics/Hypnotics 2 Failed - 03/12/2021  8:01 AM      Failed - This refill cannot be delegated      Failed - Urine Drug Screen completed in last 360 days      Passed - Patient is not pregnant      Passed - Valid encounter within last 6 months    Recent Outpatient Visits           1 month ago Benign essential HTN   [place], Coralie Keens, NP   2 months ago Viral URI with cough   [facility], Coralie Keens, NP   7 months ago Peripheral edema   [facility] [place], [place], NP   11 months ago COPD (chronic obstructive pulmonary disease) with chronic bronchitis ([place])   [facility] Olin Hauser, DO   1 year ago Right arm pain   [facility], Lupita Raider, FNP       Future Appointments             In 1 month [facility], [facility]

## 2021-03-17 ENCOUNTER — Other Ambulatory Visit: Payer: Self-pay | Admitting: Internal Medicine

## 2021-03-17 DIAGNOSIS — F410 Panic disorder [episodic paroxysmal anxiety] without agoraphobia: Secondary | ICD-10-CM

## 2021-03-17 NOTE — Telephone Encounter (Signed)
Requested Prescriptions  Pending Prescriptions Disp Refills   busPIRone (BUSPAR) 7.5 MG tablet [Pharmacy Med Name: buspirone 7.5 mg tablet] 90 tablet 0    Sig: TAKE ONE TABLET BY MOUTH THREE TIMES DAILY     Psychiatry: Anxiolytics/Hypnotics - Non-controlled Passed - 03/17/2021  9:38 AM      Passed - Valid encounter within last 12 months    Recent Outpatient Visits          1 month ago Benign essential HTN   [facility] [place], Coralie Keens, NP   2 months ago Viral URI with cough   [facility] [place], Coralie Keens, NP   7 months ago Peripheral edema   [facility] [place], [place], NP   11 months ago COPD (chronic obstructive pulmonary disease) with chronic bronchitis ([place])   [place], DO   1 year ago Right arm pain   [facility], Lupita Raider, FNP      Future Appointments            In 1 month [facility], [facility]

## 2021-04-18 ENCOUNTER — Ambulatory Visit: Payer: PPO

## 2021-04-18 ENCOUNTER — Other Ambulatory Visit: Payer: Self-pay | Admitting: Internal Medicine

## 2021-04-18 DIAGNOSIS — F410 Panic disorder [episodic paroxysmal anxiety] without agoraphobia: Secondary | ICD-10-CM

## 2021-04-18 DIAGNOSIS — I10 Essential (primary) hypertension: Secondary | ICD-10-CM

## 2021-04-18 MED ORDER — CARVEDILOL 3.125 MG PO TABS
ORAL_TABLET | ORAL | 0 refills | Status: DC
Start: 2021-04-18 — End: 2021-07-18

## 2021-04-18 MED ORDER — BUSPIRONE HCL 7.5 MG PO TABS
7.5000 mg | ORAL_TABLET | Freq: Three times a day (TID) | ORAL | 0 refills | Status: DC
Start: 2021-04-18 — End: 2021-05-16

## 2021-04-18 NOTE — Telephone Encounter (Signed)
Requested Prescriptions  ?Pending Prescriptions Disp Refills  ?? busPIRone (BUSPAR) 7.5 MG tablet 90 tablet 0  ?  Sig: Take 1 tablet (7.5 mg total) by mouth 3 (three) times daily.  ?  ? Psychiatry: Anxiolytics/Hypnotics - Non-controlled Passed - 04/18/2021  9:57 AM  ?  ?  Passed - Valid encounter within last 12 months  ?  Recent Outpatient Visits   ?      ? 2 months ago Benign essential HTN  ? [facility] [place], [place], NP  ? 3 months ago Viral URI with cough  ? [facility] [place], [place] W, NP  ? 8 months ago Peripheral edema  ? [facility] [place], [place] W, NP  ? 1 year ago COPD (chronic obstructive pulmonary disease) with chronic bronchitis ([place])  ? [place], DO  ? 1 year ago Right arm pain  ? [facility], Lupita Raider, FNP  ?  ?  ?Future Appointments   ?        ? In 2 days [place], Coralie Keens, NP [facility], [place]  ? In 3 days  [facility], [facility]  ?  ? ?  ?  ?  ?? carvedilol (COREG) 3.125 MG tablet 180 tablet 0  ?  ? Cardiovascular: Beta Blockers 3 Failed - 04/18/2021  9:57 AM  ?  ?  Failed - AST in normal range and within 360 days  ?  AST  ?Date Value Ref Range Status  ?12/02/2020 14 (L) 15 - 41 U/L Final  ?   ?  ?  Failed - Last BP in normal range  ?  BP Readings from Last 1 Encounters:  ?01/11/21 (!) 119/45  ?   ?  ?  Passed - Cr in normal range and within 360 days  ?  Creat  ?Date Value Ref Range Status  ?07/28/2020 1.00 (H) 0.60 - 0.93 mg/dL Final  ?  Comment:  ?  For patients >51 years of age, the reference limit ?for Creatinine is approximately 13% higher for people ?identified as African-American. ?. ?  ? ?Creatinine, Ser  ?Date Value Ref Range Status  ?12/09/2020 0.62 0.44 - 1.00 mg/dL Final  ?   ?  ?  Passed - ALT in normal range and within 360 days  ?  ALT  ?Date Value Ref Range Status  ?12/02/2020 9 0 - 44 U/L Final  ?   ?  ?  Passed - Last Heart Rate in normal range  ?   Pulse Readings from Last 1 Encounters:  ?01/11/21 80  ?   ?  ?  Passed - Valid encounter within last 6 months  ?  Recent Outpatient Visits   ?      ? 2 months ago Benign essential HTN  ? [facility] [place], [place], NP  ? 3 months ago Viral URI with cough  ? [facility] [place], [place] W, NP  ? 8 months ago Peripheral edema  ? [facility] [place], [place] W, NP  ? 1 year ago COPD (chronic obstructive pulmonary disease) with chronic bronchitis ([place])  ? [place], DO  ? 1 year ago Right arm pain  ? [facility], Lupita Raider, FNP  ?  ?  ?Future Appointments   ?        ? In 2 days  Jearld Fenton, NP [facility], [place]  ? In 3 days  [facility], [place]  ?  ? ?  ?  ?  ? ?

## 2021-04-18 NOTE — Telephone Encounter (Signed)
Copied from [place] [id number]. Topic: Quick Communication - Rx Refill/Question ?>> Apr 18, 2021  8:10 AM Leward Quan A wrote: ?Medication: busPIRone (BUSPAR) 7.5 MG tablet, carvedilol (COREG) 3.125 MG tablet ? ?Has the patient contacted their pharmacy? No. Will be new pharmacy  ?(Agent: If no, request that the patient contact the pharmacy for the refill. If patient does not wish to contact the pharmacy document the reason why and proceed with request.) ?(Agent: If yes, when and what did the pharmacy advise?) ? ?Preferred Pharmacy (with phone number or street name): [id number] - [place], [place]  ?Phone:  [phone] ?Fax:  [phone] ? ? ? ?Has the patient been seen for an appointment in the last year OR does the patient have an upcoming appointment? Yes.   ? ?Agent: Please be advised that RX refills may take up to 3 business days. We ask that you follow-up with your pharmacy. ?

## 2021-04-20 ENCOUNTER — Ambulatory Visit (INDEPENDENT_AMBULATORY_CARE_PROVIDER_SITE_OTHER): Payer: PPO | Admitting: Internal Medicine

## 2021-04-20 ENCOUNTER — Other Ambulatory Visit: Payer: Self-pay

## 2021-04-20 ENCOUNTER — Encounter: Payer: Self-pay | Admitting: Internal Medicine

## 2021-04-20 VITALS — BP 126/70 | HR 92 | Temp 97.7°F | Ht 63.0 in | Wt 200.0 lb

## 2021-04-20 DIAGNOSIS — Z1211 Encounter for screening for malignant neoplasm of colon: Secondary | ICD-10-CM | POA: Diagnosis not present

## 2021-04-20 DIAGNOSIS — Z1231 Encounter for screening mammogram for malignant neoplasm of breast: Secondary | ICD-10-CM

## 2021-04-20 DIAGNOSIS — R7303 Prediabetes: Secondary | ICD-10-CM

## 2021-04-20 DIAGNOSIS — Z780 Asymptomatic menopausal state: Secondary | ICD-10-CM

## 2021-04-20 DIAGNOSIS — Z0001 Encounter for general adult medical examination with abnormal findings: Secondary | ICD-10-CM | POA: Diagnosis not present

## 2021-04-20 DIAGNOSIS — Z6835 Body mass index (BMI) 35.0-35.9, adult: Secondary | ICD-10-CM

## 2021-04-20 DIAGNOSIS — F410 Panic disorder [episodic paroxysmal anxiety] without agoraphobia: Secondary | ICD-10-CM | POA: Diagnosis not present

## 2021-04-20 DIAGNOSIS — E039 Hypothyroidism, unspecified: Secondary | ICD-10-CM | POA: Diagnosis not present

## 2021-04-20 MED ORDER — CLONAZEPAM 1 MG PO TABS: ORAL_TABLET | ORAL | 0 refills | Status: DC

## 2021-04-20 NOTE — Assessment & Plan Note (Signed)
Encouraged diet and exercise for weight loss ?

## 2021-04-20 NOTE — Patient Instructions (Signed)
Health Maintenance for Postmenopausal Women ?Menopause is a normal process in which your ability to get pregnant comes to an end. This process happens slowly over many months or years, usually between the ages of 48 and 55. Menopause is complete when you have missed your menstrual period for 12 months. ?It is important to talk with your health care provider about some of the most common conditions that affect women after menopause (postmenopausal women). These include heart disease, cancer, and bone loss (osteoporosis). Adopting a healthy lifestyle and getting preventive care can help to promote your health and wellness. The actions you take can also lower your chances of developing some of these common conditions. ?What are the signs and symptoms of menopause? ?During menopause, you may have the following symptoms: ?Hot flashes. These can be moderate or severe. ?Night sweats. ?Decrease in sex drive. ?Mood swings. ?Headaches. ?Tiredness (fatigue). ?Irritability. ?Memory problems. ?Problems falling asleep or staying asleep. ?Talk with your health care provider about treatment options for your symptoms. ?Do I need hormone replacement therapy? ?Hormone replacement therapy is effective in treating symptoms that are caused by menopause, such as hot flashes and night sweats. ?Hormone replacement carries certain risks, especially as you become older. If you are thinking about using estrogen or estrogen with progestin, discuss the benefits and risks with your health care provider. ?How can I reduce my risk for heart disease and stroke? ?The risk of heart disease, heart attack, and stroke increases as you age. One of the causes may be a change in the body's hormones during menopause. This can affect how your body uses dietary fats, triglycerides, and cholesterol. Heart attack and stroke are medical emergencies. There are many things that you can do to help prevent heart disease and stroke. ?Watch your blood pressure ?High  blood pressure causes heart disease and increases the risk of stroke. This is more likely to develop in people who have high blood pressure readings or are overweight. ?Have your blood pressure checked: ?Every 3-5 years if you are 18-39 years of age. ?Every year if you are 40 years old or older. ?Eat a healthy diet ? ?Eat a diet that includes plenty of vegetables, fruits, low-fat dairy products, and lean protein. ?Do not eat a lot of foods that are high in solid fats, added sugars, or sodium. ?Get regular exercise ?Get regular exercise. This is one of the most important things you can do for your health. Most adults should: ?Try to exercise for at least 150 minutes each week. The exercise should increase your heart rate and make you sweat (moderate-intensity exercise). ?Try to do strengthening exercises at least twice each week. Do these in addition to the moderate-intensity exercise. ?Spend less time sitting. Even light physical activity can be beneficial. ?Other tips ?Work with your health care provider to achieve or maintain a healthy weight. ?Do not use any products that contain nicotine or tobacco. These products include cigarettes, chewing tobacco, and vaping devices, such as e-cigarettes. If you need help quitting, ask your health care provider. ?Know your numbers. Ask your health care provider to check your cholesterol and your blood sugar (glucose). Continue to have your blood tested as directed by your health care provider. ?Do I need screening for cancer? ?Depending on your health history and family history, you may need to have cancer screenings at different stages of your life. This may include screening for: ?Breast cancer. ?Cervical cancer. ?Lung cancer. ?Colorectal cancer. ?What is my risk for osteoporosis? ?After menopause, you may be   at increased risk for osteoporosis. Osteoporosis is a condition in which bone destruction happens more quickly than new bone creation. To help prevent osteoporosis or  the bone fractures that can happen because of osteoporosis, you may take the following actions: ?If you are 19-50 years old, get at least 1,000 mg of calcium and at least 600 international units (IU) of vitamin D per day. ?If you are older than age 50 but younger than age 70, get at least 1,200 mg of calcium and at least 600 international units (IU) of vitamin D per day. ?If you are older than age 70, get at least 1,200 mg of calcium and at least 800 international units (IU) of vitamin D per day. ?Smoking and drinking excessive alcohol increase the risk of osteoporosis. Eat foods that are rich in calcium and vitamin D, and do weight-bearing exercises several times each week as directed by your health care provider. ?How does menopause affect my mental health? ?Depression may occur at any age, but it is more common as you become older. Common symptoms of depression include: ?Feeling depressed. ?Changes in sleep patterns. ?Changes in appetite or eating patterns. ?Feeling an overall lack of motivation or enjoyment of activities that you previously enjoyed. ?Frequent crying spells. ?Talk with your health care provider if you think that you are experiencing any of these symptoms. ?General instructions ?See your health care provider for regular wellness exams and vaccines. This may include: ?Scheduling regular health, dental, and eye exams. ?Getting and maintaining your vaccines. These include: ?Influenza vaccine. Get this vaccine each year before the flu season begins. ?Pneumonia vaccine. ?Shingles vaccine. ?Tetanus, diphtheria, and pertussis (Tdap) booster vaccine. ?Your health care provider may also recommend other immunizations. ?Tell your health care provider if you have ever been abused or do not feel safe at home. ?Summary ?Menopause is a normal process in which your ability to get pregnant comes to an end. ?This condition causes hot flashes, night sweats, decreased interest in sex, mood swings, headaches, or lack  of sleep. ?Treatment for this condition may include hormone replacement therapy. ?Take actions to keep yourself healthy, including exercising regularly, eating a healthy diet, watching your weight, and checking your blood pressure and blood sugar levels. ?Get screened for cancer and depression. Make sure that you are up to date with all your vaccines. ?This information is not intended to replace advice given to you by your health care provider. Make sure you discuss any questions you have with your health care provider. ?Document Revised: 06/13/2020 Document Reviewed: 06/13/2020 ?Elsevier Patient Education ? 2022 Elsevier Inc. ? ?

## 2021-04-20 NOTE — Progress Notes (Signed)
Subjective:    Patient ID: [name], female    DOB: 09-27-1950, 71 y.o.   MRN: [id number]  HPI  Patient presents to clinic today for her annual exam.  Flu: 11/2019 Tetanus: unsure COVID: never Pneumovax: 04/2019 Prevnar: 05/2017 Shingrix: never Pap smear: no longer screening Mammogram: 08/2016 Bone density: never Colon screening: never Vision screening: as needed Dentist: as needed dentures  Diet: She does eat some meat. She consumes fruits and veggies. She tries to avoid fried foods. She drinks mostly sweet tea, sprite. Exercise: None  Review of Systems     Past Medical History:  Diagnosis Date   Anxiety    Arthritis    Chronic back pain    COPD (chronic obstructive pulmonary disease) (HCC)    Depression    GERD (gastroesophageal reflux disease)    Hypertension    Hypothyroidism    Pneumonia    RECENT X 3   PONV (postoperative nausea and vomiting)    Psoriasis    Thyroid disease     Current Outpatient Medications  Medication Sig Dispense Refill   acetaminophen (TYLENOL) 500 MG tablet Take 500 mg by mouth every 6 (six) hours as needed for moderate pain.      albuterol (VENTOLIN HFA) 108 (90 Base) MCG/ACT inhaler INHALE 2 PUFFS BY MOUTH INTO THE LUNGS EVERY 6 HOURS AS NEEDED FOR WHEEZING OR SHORTNESS 8.5 g 2   busPIRone (BUSPAR) 7.5 MG tablet Take 1 tablet (7.5 mg total) by mouth 3 (three) times daily. 90 tablet 0   carvedilol (COREG) 3.125 MG tablet TAKE ONE TABLET BY MOUTH TWICE DAILY TAKE WITH A MEAL 180 tablet 0   citalopram (CELEXA) 40 MG tablet Take 1 tablet (40 mg total) by mouth daily. 90 tablet 0   clonazePAM (KLONOPIN) 1 MG tablet TAKE ONE TABLET BY MOUTH IN THE MORNING, ONE-HALF TABLET IN THE AFTERNOON AND TAKE ONE TABLET BY MOUTH AT BEDTIME 75 tablet 0   diltiazem (CARDIZEM CD) 120 MG 24 hr capsule TAKE ONE CAPSULE BY MOUTH ONCE DAILY 90 capsule 0   fluticasone furoate-vilanterol (BREO ELLIPTA) 100-25 MCG/ACT AEPB Inhale 1 puff into the lungs daily. 1  each 11   Guselkumab (TREMFYA Bode) Inject into the skin every 30 (thirty) days. (Patient not taking: Reported on 01/11/2021)     ipratropium-albuterol (DUONEB) 0.5-2.5 (3) MG/3ML SOLN Take 3 mLs by nebulization every 6 (six) hours as needed. (Patient not taking: Reported on 01/11/2021) 360 mL    levothyroxine (SYNTHROID) 175 MCG tablet Take 1 tablet (175 mcg total) by mouth daily. 90 tablet 2   lisinopril (ZESTRIL) 10 MG tablet TAKE ONE TABLET BY MOUTH ONCE DAILY 90 tablet 0   Misc. Devices (QUAD CANE/SMALL BASE) MISC 1 Device by Does not apply route daily. 1 each 0   pravastatin (PRAVACHOL) 10 MG tablet Take 1 tablet (10 mg total) by mouth daily. 90 tablet 0   No current facility-administered medications for this visit.    Allergies  Allergen Reactions   Ciprofloxacin Hives and Diarrhea   Mucinex [Guaifenesin Er] Nausea Only    Per pt   Oxycodone Diarrhea   Oxycodone-Acetaminophen Diarrhea   Oxycodone-Acetaminophen Diarrhea   Sulfa Antibiotics Rash    Throat, mouth Throat, mouth    Family History  Problem Relation Age of Onset   Heart disease Mother    Breast cancer Mother    Heart attack Mother    COPD Father    Stroke Paternal Uncle    Kidney disease Sister  Breast cancer Cousin    Breast cancer Cousin    Heart attack Maternal Aunt    Stroke Paternal Grandfather    Kidney cancer Neg Hx    Bladder Cancer Neg Hx     Social History   Socioeconomic History   Marital status: Widowed    Spouse name: Not on file   Number of children: Not on file   Years of education: Not on file   Highest education level: Not on file  Occupational History   Occupation: retired  Tobacco Use   Smoking status: Former    Packs/day: 1.00    Years: 35.00    Pack years: 35.00    Types: Cigarettes    Start date: 04/19/2018    Quit date: 11/16/2020    Years since quitting: 0.4   Smokeless tobacco: Never   Tobacco comments:    Previously smoked 2 ppd.   Vaping Use   Vaping Use: Never  used  Substance and Sexual Activity   Alcohol use: No   Drug use: No   Sexual activity: Not Currently    Birth control/protection: Surgical  Other Topics Concern   Not on file  Social History Narrative   Not on file   Social Determinants of Health   Financial Resource Strain: Not on file  Food Insecurity: Not on file  Transportation Needs: Not on file  Physical Activity: Not on file  Stress: Not on file  Social Connections: Not on file  Intimate Partner Violence: Not on file     Constitutional: Denies fever, malaise, fatigue, headache or abrupt weight changes.  HEENT: Denies eye pain, eye redness, ear pain, ringing in the ears, wax buildup, runny nose, nasal congestion, bloody nose, or sore throat. Respiratory: Pt reports intermittent cough and shortness of breath. Denies difficulty breathing or sputum production.   Cardiovascular: Denies chest pain, chest tightness, palpitations or swelling in the hands or feet.  Gastrointestinal: Denies abdominal pain, bloating, constipation, diarrhea or blood in the stool.  GU: Denies urgency, frequency, pain with urination, burning sensation, blood in urine, odor or discharge. Musculoskeletal: Denies decrease in range of motion, difficulty with gait, muscle pain or joint pain and swelling.  Skin: Denies redness, rashes, lesions or ulcercations.  Neurological: Patient reports insomnia.  Denies dizziness, difficulty with memory, difficulty with speech or problems with balance and coordination.  Psych: Patient has a history of anxiety and depression.  Denies SI/HI.  No other specific complaints in a complete review of systems (except as listed in HPI above).  Objective:   Physical Exam BP 126/70 (BP Location: Right Arm, Patient Position: Sitting, Cuff Size: Large)   Pulse 92   Temp 97.7 F (36.5 C) (Temporal)   Ht 5\' 3"  (1.6 m)   Wt 200 lb (90.7 kg)   SpO2 92%   BMI 35.43 kg/m   Wt Readings from Last 3 Encounters:  01/11/21 196 lb  12.8 oz (89.3 kg)  12/02/20 200 lb 9.9 oz (91 kg)  07/25/20 210 lb 9.6 oz (95.5 kg)    General: Appears her stated age, obese, in NAD. Skin: Warm, dry and intact. HEENT: Head: normal shape and size; Eyes: sclera white and EOMs intact;  Neck:  Neck supple, trachea midline. No masses, lumps or thyromegaly present.  Cardiovascular: Normal rate and rhythm. S1,S2 noted.  No murmur, rubs or gallops noted. No JVD or BLE edema. No carotid bruits noted. Pulmonary/Chest: Normal effort and positive vesicular breath sounds. No respiratory distress. No wheezes, rales or ronchi  noted.  Abdomen: Soft and nontender. Normal bowel sounds. No distention or masses noted.  Musculoskeletal: Strength 5/5 BUE/BLE. No difficulty with gait.  Neurological: Alert and oriented. Cranial nerves II-XII grossly intact. Coordination normal.  Psychiatric: Mood and affect normal. Behavior is normal. Judgment and thought content normal.     BMET    Component Value Date/Time   NA 138 12/03/2020 0607   NA 142 06/24/2015 1102   NA 135 (L) 02/02/2013 1021   K 3.6 12/03/2020 0607   K 3.7 02/02/2013 1021   CL 93 (L) 12/03/2020 0607   CL 103 02/02/2013 1021   CO2 38 (H) 12/03/2020 0607   CO2 30 02/02/2013 1021   GLUCOSE 171 (H) 12/03/2020 0607   GLUCOSE 152 (H) 02/02/2013 1021   BUN 14 12/03/2020 0607   BUN 12 06/24/2015 1102   BUN 8 02/02/2013 1021   CREATININE 0.62 12/09/2020 0701   CREATININE 1.00 (H) 07/28/2020 1004   CALCIUM 8.8 (L) 12/03/2020 0607   CALCIUM 9.9 02/02/2013 1021   GFRNONAA >60 12/09/2020 0701   GFRNONAA 72 12/03/2019 0947   GFRAA 84 12/03/2019 0947    Lipid Panel     Component Value Date/Time   CHOL 197 07/28/2020 1004   CHOL 205 (H) 02/03/2015 1022   TRIG 205 (H) 07/28/2020 1004   HDL 41 (L) 07/28/2020 1004   HDL 36 (L) 02/03/2015 1022   CHOLHDL 4.8 07/28/2020 1004   LDLCALC 124 (H) 07/28/2020 1004    CBC    Component Value Date/Time   WBC 7.4 12/03/2020 0607   RBC 5.02  12/03/2020 0607   HGB 14.6 12/03/2020 0607   HGB 15.6 06/24/2015 1102   HCT 46.9 (H) 12/03/2020 0607   HCT 46.6 06/24/2015 1102   PLT 210 12/03/2020 0607   PLT 307 06/24/2015 1102   MCV 93.4 12/03/2020 0607   MCV 93 06/24/2015 1102   MCV 90 02/02/2013 1021   MCH 29.1 12/03/2020 0607   MCHC 31.1 12/03/2020 0607   RDW 14.3 12/03/2020 0607   RDW 13.3 06/24/2015 1102   RDW 13.2 02/02/2013 1021   LYMPHSABS 2.2 12/02/2020 1612   LYMPHSABS 2.3 06/24/2015 1102   MONOABS 0.8 12/02/2020 1612   EOSABS 0.2 12/02/2020 1612   EOSABS 0.3 06/24/2015 1102   BASOSABS 0.1 12/02/2020 1612   BASOSABS 0.1 06/24/2015 1102    Hgb A1C Lab Results  Component Value Date   HGBA1C 6.1 (H) 07/28/2020          Assessment & Plan:   Preventative Health Maintenance:  Encouraged her to get a flu shot in the fall She declines tetanus today for financial reasons, advise her if she gets bit or cut, she wil need to have this done. Pneumovax and Prevnar UTD Encouraged her to get her COVID-vaccine Discussed Shingrix, she will get this at the pharmacy if she would like She never wants to screen for cervical cancer Mammogram and bone density ordered-she will call to schedule Cologuard ordered Encouraged her to consume a balanced diet and exercise regimen Advised her to see an eye doctor and dentist annually We will check CBC, c-Met, TSH, free T4, lipid and A1c today  RTC in 6 months, follow-up chronic conditions Nicki Reaper, NP This visit occurred during the SARS-CoV-2 public health emergency.  Safety protocols were in place, including screening questions prior to the visit, additional usage of staff PPE, and extensive cleaning of exam room while observing appropriate contact time as indicated for disinfecting solutions.

## 2021-04-21 ENCOUNTER — Other Ambulatory Visit: Payer: Self-pay

## 2021-04-21 ENCOUNTER — Ambulatory Visit (INDEPENDENT_AMBULATORY_CARE_PROVIDER_SITE_OTHER): Payer: PPO

## 2021-04-21 VITALS — Ht 63.0 in | Wt 200.0 lb

## 2021-04-21 DIAGNOSIS — Z1211 Encounter for screening for malignant neoplasm of colon: Secondary | ICD-10-CM

## 2021-04-21 DIAGNOSIS — Z0000 Encounter for general adult medical examination without abnormal findings: Secondary | ICD-10-CM | POA: Diagnosis not present

## 2021-04-21 LAB — T4, FREE: Free T4: 1.4 ng/dL (ref 0.8–1.8)

## 2021-04-21 LAB — COMPLETE METABOLIC PANEL WITH GFR
AG Ratio: 1.6 (calc) (ref 1.0–2.5)
ALT: 14 U/L (ref 6–29)
AST: 18 U/L (ref 10–35)
Albumin: 4.2 g/dL (ref 3.6–5.1)
Alkaline phosphatase (APISO): 93 U/L (ref 37–153)
BUN: 13 mg/dL (ref 7–25)
CO2: 31 mmol/L (ref 20–32)
Calcium: 9.8 mg/dL (ref 8.6–10.4)
Chloride: 100 mmol/L (ref 98–110)
Creat: 0.91 mg/dL (ref 0.60–1.00)
Globulin: 2.7 g/dL (calc) (ref 1.9–3.7)
Glucose, Bld: 130 mg/dL (ref 65–139)
Potassium: 4.1 mmol/L (ref 3.5–5.3)
Sodium: 142 mmol/L (ref 135–146)
Total Bilirubin: 0.4 mg/dL (ref 0.2–1.2)
Total Protein: 6.9 g/dL (ref 6.1–8.1)
eGFR: 67 mL/min/{1.73_m2} (ref >=60)

## 2021-04-21 LAB — TSH: TSH: 7.69 mIU/L — ABNORMAL HIGH (ref 0.40–4.50)

## 2021-04-21 LAB — CBC
HCT: 47.9 % — ABNORMAL HIGH (ref 35.0–45.0)
Hemoglobin: 15.4 g/dL (ref 11.7–15.5)
MCH: 30.2 pg (ref 27.0–33.0)
MCHC: 32.2 g/dL (ref 32.0–36.0)
MCV: 93.9 fL (ref 80.0–100.0)
MPV: 10.8 fL (ref 7.5–12.5)
Platelets: 255 10*3/uL (ref 140–400)
RBC: 5.1 10*6/uL (ref 3.80–5.10)
RDW: 11.8 % (ref 11.0–15.0)
WBC: 8.8 10*3/uL (ref 3.8–10.8)

## 2021-04-21 LAB — LIPID PANEL
Cholesterol: 220 mg/dL — ABNORMAL HIGH (ref <200)
HDL: 42 mg/dL — ABNORMAL LOW (ref >=50)
LDL Cholesterol (Calc): 139 mg/dL (calc) — ABNORMAL HIGH
Non-HDL Cholesterol (Calc): 178 mg/dL (calc) — ABNORMAL HIGH (ref <130)
Total CHOL/HDL Ratio: 5.2 (calc) — ABNORMAL HIGH (ref <5.0)
Triglycerides: 242 mg/dL — ABNORMAL HIGH (ref <150)

## 2021-04-21 LAB — HEMOGLOBIN A1C
Hgb A1c MFr Bld: 6.4 % of total Hgb — ABNORMAL HIGH (ref <5.7)
Mean Plasma Glucose: 137 mg/dL
eAG (mmol/L): 7.6 mmol/L

## 2021-04-21 MED ORDER — ROSUVASTATIN CALCIUM 10 MG PO TABS: 10.0000 mg | ORAL_TABLET | Freq: Every day | ORAL | 0 refills | Status: DC

## 2021-04-21 MED ORDER — LEVOTHYROXINE SODIUM 200 MCG PO TABS: 200.0000 ug | ORAL_TABLET | Freq: Every day | ORAL | 0 refills | Status: DC

## 2021-04-21 NOTE — Progress Notes (Signed)
?Virtual Visit via Telephone Note ? ?I connected with  [name] on 04/21/21 at  9:00 AM EDT by telephone and verified that I am speaking with the correct person using two identifiers. ? ?Location: ?Patient: home ?Provider: [facility] [place] ?Persons participating in the virtual visit: patient/Nurse Health Advisor ?  ?I discussed the limitations, risks, security and privacy concerns of performing an evaluation and management service by telephone and the availability of in person appointments. The patient expressed understanding and agreed to proceed. ? ?Interactive audio and video telecommunications were attempted between this nurse and patient, however failed, due to patient having technical difficulties OR patient did not have access to video capability.  We continued and completed visit with audio only. ? ?Some vital signs may be absent or patient reported.  ? ?Dionisio David, LPN ? ?Subjective:  ? [name] is a 71 y.o. female who presents for Medicare Annual (Subsequent) preventive examination. ? ?Review of Systems    ? ?  ? ?   ?Objective:  ?  ?Today's Vitals  ? 04/21/21 0901  ?Weight: 200 lb (90.7 kg)  ?Height: '5\' 3"'$  (1.6 m)  ? ?Body mass index is 35.43 kg/m?. ? ?Advanced Directives 12/02/2020 04/12/2020 04/07/2019 12/30/2017 12/02/2017 12/01/2017 11/30/2017  ?Does Patient Have a Medical Advance Directive? No;Yes Yes Yes Yes (No Data) No (No Data)  ?Type of Industrial/product designer of [place] - - -  ?Does patient want to make changes to medical advance directive? No - Patient declined - - - - - -  ?Copy of [place] in Chart? No - copy requested No - copy requested No - copy requested - - - -  ?Would patient like information on creating a medical advance directive? - - - - - Yes (Inpatient - patient requests chaplain consult to create a medical advance directive) -  ? ? ?Current Medications  (verified) ?Outpatient Encounter Medications as of 04/21/2021  ?Medication Sig  ? [DISCONTINUED] diltiazem (TIAZAC) 120 MG 24 hr capsule Take 1 capsule by mouth daily.  ? acetaminophen (TYLENOL) 500 MG tablet Take 500 mg by mouth every 6 (six) hours as needed for moderate pain.   ? albuterol (VENTOLIN HFA) 108 (90 Base) MCG/ACT inhaler INHALE 2 PUFFS BY MOUTH INTO THE LUNGS EVERY 6 HOURS AS NEEDED FOR WHEEZING OR SHORTNESS  ? busPIRone (BUSPAR) 7.5 MG tablet Take 1 tablet (7.5 mg total) by mouth 3 (three) times daily.  ? carvedilol (COREG) 3.125 MG tablet TAKE ONE TABLET BY MOUTH TWICE DAILY TAKE WITH A MEAL  ? citalopram (CELEXA) 40 MG tablet Take 1 tablet (40 mg total) by mouth daily.  ? clobetasol cream (TEMOVATE) 7.98 % Apply 1 application. topically 2 (two) times daily.  ? diltiazem (CARDIZEM CD) 120 MG 24 hr capsule TAKE ONE CAPSULE BY MOUTH ONCE DAILY  ? fluticasone furoate-vilanterol (BREO ELLIPTA) 100-25 MCG/ACT AEPB Inhale 1 puff into the lungs daily.  ? hydrOXYzine (ATARAX) 25 MG tablet Take 25 mg by mouth at bedtime.  ? ipratropium-albuterol (DUONEB) 0.5-2.5 (3) MG/3ML SOLN Take 3 mLs by nebulization every 6 (six) hours as needed. (Patient not taking: Reported on 01/11/2021)  ? levothyroxine (SYNTHROID) 175 MCG tablet Take 1 tablet (175 mcg total) by mouth daily.  ? lisinopril (ZESTRIL) 10 MG tablet TAKE ONE TABLET BY MOUTH ONCE DAILY  ? Misc. Devices (QUAD CANE/SMALL BASE) MISC 1 Device by Does not apply route daily.  ?  pravastatin (PRAVACHOL) 10 MG tablet Take 1 tablet (10 mg total) by mouth daily.  ? Vitamin D, Ergocalciferol, (DRISDOL) 1.25 MG (50000 UNIT) CAPS capsule Take 50,000 Units by mouth 2 (two) times a week.  ? [DISCONTINUED] clonazePAM (KLONOPIN) 1 MG tablet TAKE ONE TABLET BY MOUTH IN THE MORNING, ONE-HALF TABLET IN THE AFTERNOON AND TAKE ONE TABLET BY MOUTH AT BEDTIME  ? [DISCONTINUED] Guselkumab (TREMFYA [place]) Inject into the skin every 30 (thirty) days. (Patient not taking: Reported on  04/20/2021)  ? ?No facility-administered encounter medications on file as of 04/21/2021.  ? ? ?Allergies (verified) ?Ciprofloxacin, Mucinex [guaifenesin er], Oxycodone, Oxycodone-acetaminophen, Oxycodone-acetaminophen, and Sulfa antibiotics  ? ?History: ?Past Medical History:  ?Diagnosis Date  ? Anxiety   ? Arthritis   ? Chronic back pain   ? COPD (chronic obstructive pulmonary disease) ([place])   ? Depression   ? GERD (gastroesophageal reflux disease)   ? Hypertension   ? Hypothyroidism   ? Pneumonia   ? RECENT X 3  ? PONV (postoperative nausea and vomiting)   ? Psoriasis   ? Thyroid disease   ? ?Past Surgical History:  ?Procedure Laterality Date  ? ABDOMINAL HYSTERECTOMY    ? heavy bleeding  ? bladder stimulator    ? CARPAL TUNNEL RELEASE    ? CATARACT EXTRACTION W/PHACO Right 03/07/2015  ? Procedure: CATARACT EXTRACTION PHACO AND INTRAOCULAR LENS PLACEMENT (IOC);  Surgeon: Estill Cotta, MD;  Location: [facility] ORS;  Service: Ophthalmology;  Laterality: Right;  [name] 01:14 ?AP% 26.4 ?CDE 33.04 ?fluid pack lot # [id number]  ? CATARACT EXTRACTION W/PHACO Left 03/21/2015  ? Procedure: CATARACT EXTRACTION PHACO AND INTRAOCULAR LENS PLACEMENT (IOC);  Surgeon: Estill Cotta, MD;  Location: [facility] ORS;  Service: Ophthalmology;  Laterality: Left;  [name] 01:21 ?AP% 25.6 ?CDE 39.78 ?fluid pack lot # [id number]  ? HAND SURGERY Right   ? INTERSTIM IMPLANT REMOVAL N/A 09/07/2015  ? Procedure: REMOVAL OF INTERSTIM IMPLANT;  Surgeon: Bjorn Loser, MD;  Location: [facility] ORS;  Service: Urology;  Laterality: N/A;  ? OOPHORECTOMY Right   ? ovarian cyst  ? ?Family History  ?Problem Relation Age of Onset  ? Heart disease Mother   ? Breast cancer Mother   ? Heart attack Mother   ? COPD Father   ? Stroke Paternal Uncle   ? Kidney disease Sister   ? Breast cancer Cousin   ? Breast cancer Cousin   ? Heart attack Maternal Aunt   ? Stroke Paternal Grandfather   ? Kidney cancer Neg Hx   ? Bladder Cancer Neg Hx   ? ?Social History  ? ?Socioeconomic History   ? Marital status: Widowed  ?  Spouse name: Not on file  ? Number of children: Not on file  ? Years of education: Not on file  ? Highest education level: Not on file  ?Occupational History  ? Occupation: retired  ?Tobacco Use  ? Smoking status: Former  ?  Packs/day: 1.00  ?  Years: 35.00  ?  Pack years: 35.00  ?  Types: Cigarettes  ?  Start date: 04/19/2018  ?  Quit date: 11/16/2020  ?  Years since quitting: 0.4  ? Smokeless tobacco: Never  ? Tobacco comments:  ?  Previously smoked 2 ppd.   ?Vaping Use  ? Vaping Use: Never used  ?Substance and Sexual Activity  ? Alcohol use: No  ? Drug use: No  ? Sexual activity: Not Currently  ?  Birth control/protection: Surgical  ?Other Topics Concern  ? Not  on file  ?Social History Narrative  ? Not on file  ? ?Social Determinants of Health  ? ?Financial Resource Strain: Not on file  ?Food Insecurity: Not on file  ?Transportation Needs: Not on file  ?Physical Activity: Not on file  ?Stress: Not on file  ?Social Connections: Not on file  ? ? ?Tobacco Counseling ?Counseling given: Not Answered ?Tobacco comments: Previously smoked 2 ppd.  ? ? ?Clinical Intake: ? ?Pre-visit preparation completed: Yes ? ?Pain : No/denies pain ? ?  ? ?Nutritional Risks: None ?Diabetes: No ? ?How often do you need to have someone help you when you read instructions, pamphlets, or other written materials from your doctor or pharmacy?: 1 - Never ? ?Diabetic?no ? ?Interpreter Needed?: No ? ?Information entered by :: Kirke Shaggy, LPN ? ? ?Activities of Daily Living ?In your present state of health, do you have any difficulty performing the following activities: 04/20/2021 12/03/2020  ?Hearing? N N  ?Vision? N N  ?Difficulty concentrating or making decisions? N N  ?Walking or climbing stairs? Y Y  ?Dressing or bathing? N N  ?Doing errands, shopping? Y Y  ?Comment - states that son helps  ?Some recent data might be hidden  ? ? ?Patient Care Team: ?Jearld Fenton, NP as PCP - General (Internal  Medicine) ?Estill Cotta, MD as Consulting Physician (Ophthalmology) ?Ree Edman, MD as Consulting Physician (Dermatology) ?Corey Skains, MD as Consulting Physician (Cardiology) ?Minor, Dalbert Garnet, RN (Inactive) as C

## 2021-04-21 NOTE — Patient Instructions (Addendum)
[name] , ?Thank you for taking time to come for your Medicare Wellness Visit. I appreciate your ongoing commitment to your health goals. Please review the following plan we discussed and let me know if I can assist you in the future.  ? ?Screening recommendations/referrals: ?Colonoscopy: cologuard referral sent ?Mammogram: MD sent referral ?Bone Density: MD sent referral ?Recommended yearly ophthalmology/optometry visit for glaucoma screening and checkup ?Recommended yearly dental visit for hygiene and checkup ? ?Vaccinations: ?Influenza vaccine: 12/03/19, due ?Pneumococcal vaccine: 04/08/19 ?Tdap vaccine: 02/06/11, due ?Shingles vaccine: n/d   ?Covid-19:n/d ? ?Advanced directives: no ? ?Conditions/risks identified: no ? ?Next appointment: Follow up in one year for your annual wellness visit - 04/27/22 @ 9am by phone ? ? ?Preventive Care 72 Years and Older, Female ?Preventive care refers to lifestyle choices and visits with your health care provider that can promote health and wellness. ?What does preventive care include? ?A yearly physical exam. This is also called an annual well check. ?Dental exams once or twice a year. ?Routine eye exams. Ask your health care provider how often you should have your eyes checked. ?Personal lifestyle choices, including: ?Daily care of your teeth and gums. ?Regular physical activity. ?Eating a healthy diet. ?Avoiding tobacco and drug use. ?Limiting alcohol use. ?Practicing safe sex. ?Taking low-dose aspirin every day. ?Taking vitamin and mineral supplements as recommended by your health care provider. ?What happens during an annual well check? ?The services and screenings done by your health care provider during your annual well check will depend on your age, overall health, lifestyle risk factors, and family history of disease. ?Counseling  ?Your health care provider may ask you questions about your: ?Alcohol use. ?Tobacco use. ?Drug use. ?Emotional well-being. ?Home and relationship  well-being. ?Sexual activity. ?Eating habits. ?History of falls. ?Memory and ability to understand (cognition). ?Work and work Statistician. ?Reproductive health. ?Screening  ?You may have the following tests or measurements: ?Height, weight, and BMI. ?Blood pressure. ?Lipid and cholesterol levels. These may be checked every 5 years, or more frequently if you are over 51 years old. ?Skin check. ?Lung cancer screening. You may have this screening every year starting at age 65 if you have a 30-pack-year history of smoking and currently smoke or have quit within the past 15 years. ?Fecal occult blood test (FOBT) of the stool. You may have this test every year starting at age 24. ?Flexible sigmoidoscopy or colonoscopy. You may have a sigmoidoscopy every 5 years or a colonoscopy every 10 years starting at age 75. ?Hepatitis C blood test. ?Hepatitis B blood test. ?Sexually transmitted disease (STD) testing. ?Diabetes screening. This is done by checking your blood sugar (glucose) after you have not eaten for a while (fasting). You may have this done every 1-3 years. ?Bone density scan. This is done to screen for osteoporosis. You may have this done starting at age 24. ?Mammogram. This may be done every 1-2 years. Talk to your health care provider about how often you should have regular mammograms. ?Talk with your health care provider about your test results, treatment options, and if necessary, the need for more tests. ?Vaccines  ?Your health care provider may recommend certain vaccines, such as: ?Influenza vaccine. This is recommended every year. ?Tetanus, diphtheria, and acellular pertussis (Tdap, Td) vaccine. You may need a Td booster every 10 years. ?Zoster vaccine. You may need this after age 12. ?Pneumococcal 13-valent conjugate (PCV13) vaccine. One dose is recommended after age 17. ?Pneumococcal polysaccharide (PPSV23) vaccine. One dose is recommended after age  62. ?Talk to your health care provider about which  screenings and vaccines you need and how often you need them. ?This information is not intended to replace advice given to you by your health care provider. Make sure you discuss any questions you have with your health care provider. ?Document Released: 02/18/2015 Document Revised: 10/12/2015 Document Reviewed: 11/23/2014 ?Elsevier Interactive Patient Education ? 2017 [place]. ? ?Fall Prevention in the Home ?Falls can cause injuries. They can happen to people of all ages. There are many things you can do to make your home safe and to help prevent falls. ?What can I do on the outside of my home? ?Regularly fix the edges of walkways and driveways and fix any cracks. ?Remove anything that might make you trip as you walk through a door, such as a raised step or threshold. ?Trim any bushes or trees on the path to your home. ?Use bright outdoor lighting. ?Clear any walking paths of anything that might make someone trip, such as rocks or tools. ?Regularly check to see if handrails are loose or broken. Make sure that both sides of any steps have handrails. ?Any raised decks and porches should have guardrails on the edges. ?Have any leaves, snow, or ice cleared regularly. ?Use sand or salt on walking paths during winter. ?Clean up any spills in your garage right away. This includes oil or grease spills. ?What can I do in the bathroom? ?Use night lights. ?Install grab bars by the toilet and in the tub and shower. Do not use towel bars as grab bars. ?Use non-skid mats or decals in the tub or shower. ?If you need to sit down in the shower, use a plastic, non-slip stool. ?Keep the floor dry. Clean up any water that spills on the floor as soon as it happens. ?Remove soap buildup in the tub or shower regularly. ?Attach bath mats securely with double-sided non-slip rug tape. ?Do not have throw rugs and other things on the floor that can make you trip. ?What can I do in the bedroom? ?Use night lights. ?Make sure that you have a  light by your bed that is easy to reach. ?Do not use any sheets or blankets that are too big for your bed. They should not hang down onto the floor. ?Have a firm chair that has side arms. You can use this for support while you get dressed. ?Do not have throw rugs and other things on the floor that can make you trip. ?What can I do in the kitchen? ?Clean up any spills right away. ?Avoid walking on wet floors. ?Keep items that you use a lot in easy-to-reach places. ?If you need to reach something above you, use a strong step stool that has a grab bar. ?Keep electrical cords out of the way. ?Do not use floor polish or wax that makes floors slippery. If you must use wax, use non-skid floor wax. ?Do not have throw rugs and other things on the floor that can make you trip. ?What can I do with my stairs? ?Do not leave any items on the stairs. ?Make sure that there are handrails on both sides of the stairs and use them. Fix handrails that are broken or loose. Make sure that handrails are as long as the stairways. ?Check any carpeting to make sure that it is firmly attached to the stairs. Fix any carpet that is loose or worn. ?Avoid having throw rugs at the top or bottom of the stairs. If you do have  throw rugs, attach them to the floor with carpet tape. ?Make sure that you have a light switch at the top of the stairs and the bottom of the stairs. If you do not have them, ask someone to add them for you. ?What else can I do to help prevent falls? ?Wear shoes that: ?Do not have high heels. ?Have rubber bottoms. ?Are comfortable and fit you well. ?Are closed at the toe. Do not wear sandals. ?If you use a stepladder: ?Make sure that it is fully opened. Do not climb a closed stepladder. ?Make sure that both sides of the stepladder are locked into place. ?Ask someone to hold it for you, if possible. ?Clearly mark and make sure that you can see: ?Any grab bars or handrails. ?First and last steps. ?Where the edge of each step  is. ?Use tools that help you move around (mobility aids) if they are needed. These include: ?Canes. ?Walkers. ?Scooters. ?Crutches. ?Turn on the lights when you go into a dark area. Replace any light bulbs as

## 2021-05-01 ENCOUNTER — Other Ambulatory Visit: Payer: Self-pay | Admitting: Internal Medicine

## 2021-05-01 DIAGNOSIS — I10 Essential (primary) hypertension: Secondary | ICD-10-CM

## 2021-05-03 NOTE — Telephone Encounter (Signed)
Requested Prescriptions  ?Pending Prescriptions Disp Refills  ?? diltiazem (CARDIZEM CD) 120 MG 24 hr capsule [Pharmacy Med Name: DILTIAZEM CD '120MG'$  CAPSULES (24 HR)] 90 capsule 0  ?  Sig: TAKE ONE CAPSULE BY MOUTH ONCE DAILY.  ?  ? Cardiovascular: Calcium Channel Blockers 3 Passed - 05/01/2021 11:45 AM  ?  ?  Passed - ALT in normal range and within 360 days  ?  ALT  ?Date Value Ref Range Status  ?04/20/2021 14 6 - 29 U/L Final  ?   ?  ?  Passed - AST in normal range and within 360 days  ?  AST  ?Date Value Ref Range Status  ?04/20/2021 18 10 - 35 U/L Final  ?   ?  ?  Passed - Cr in normal range and within 360 days  ?  Creat  ?Date Value Ref Range Status  ?04/20/2021 0.91 0.60 - 1.00 mg/dL Final  ?   ?  ?  Passed - Last BP in normal range  ?  BP Readings from Last 1 Encounters:  ?04/20/21 126/70  ?   ?  ?  Passed - Last Heart Rate in normal range  ?  Pulse Readings from Last 1 Encounters:  ?04/20/21 92  ?   ?  ?  Passed - Valid encounter within last 6 months  ?  Recent Outpatient Visits   ?      ? 1 week ago Screening for colon cancer  ? [facility] [place], [place] W, NP  ? 3 months ago Benign essential HTN  ? [facility] [place], [place] W, NP  ? 3 months ago Viral URI with cough  ? [facility] [place], [place] W, NP  ? 9 months ago Peripheral edema  ? [facility] [place], [place] W, NP  ? 1 year ago COPD (chronic obstructive pulmonary disease) with chronic bronchitis ([place])  ? [place], DO  ?  ?  ? ?  ?  ?  ? ? ?

## 2021-05-05 ENCOUNTER — Other Ambulatory Visit: Payer: Self-pay | Admitting: Internal Medicine

## 2021-05-05 DIAGNOSIS — I10 Essential (primary) hypertension: Secondary | ICD-10-CM

## 2021-05-05 MED ORDER — LISINOPRIL 10 MG PO TABS: 10.0000 mg | ORAL_TABLET | Freq: Every day | ORAL | 0 refills | Status: DC

## 2021-05-05 NOTE — Telephone Encounter (Signed)
Medication Refill - Medication:  ?lisinopril (ZESTRIL) 10 MG tablet ? ?Has the patient contacted their pharmacy? Yes.   ?Contact PCP ? ?Preferred Pharmacy (with phone number or street name):  ?[id number] Lorina [address]on, [place] AT [place]  ?[address] [place], [place] [address]  ?Phone:  [phone]  Fax:  [phone]  ? ?Has the patient been seen for an appointment in the last year OR does the patient have an upcoming appointment? Yes.   ? ?Agent: Please be advised that RX refills may take up to 3 business days. We ask that you follow-up with your pharmacy. ?

## 2021-05-05 NOTE — Telephone Encounter (Signed)
Requested Prescriptions  ?Pending Prescriptions Disp Refills  ?? lisinopril (ZESTRIL) 10 MG tablet 90 tablet 0  ?  Sig: Take 1 tablet (10 mg total) by mouth daily.  ?  ? Cardiovascular:  ACE Inhibitors Passed - 05/05/2021  1:19 PM  ?  ?  Passed - Cr in normal range and within 180 days  ?  Creat  ?Date Value Ref Range Status  ?04/20/2021 0.91 0.60 - 1.00 mg/dL Final  ?   ?  ?  Passed - K in normal range and within 180 days  ?  Potassium  ?Date Value Ref Range Status  ?04/20/2021 4.1 3.5 - 5.3 mmol/L Final  ?02/02/2013 3.7 3.5 - 5.1 mmol/L Final  ?   ?  ?  Passed - Patient is not pregnant  ?  ?  Passed - Last BP in normal range  ?  BP Readings from Last 1 Encounters:  ?04/20/21 126/70  ?   ?  ?  Passed - Valid encounter within last 6 months  ?  Recent Outpatient Visits   ?      ? 2 weeks ago Screening for colon cancer  ? [facility] [place], [place] W, NP  ? 3 months ago Benign essential HTN  ? [facility] [place], [place] W, NP  ? 3 months ago Viral URI with cough  ? [facility] [place], [place] W, NP  ? 9 months ago Peripheral edema  ? [facility] [place], [place] W, NP  ? 1 year ago COPD (chronic obstructive pulmonary disease) with chronic bronchitis ([place])  ? [place], DO  ?  ?  ? ?  ?  ?  ? ?

## 2021-05-15 ENCOUNTER — Other Ambulatory Visit: Payer: Self-pay | Admitting: Internal Medicine

## 2021-05-15 DIAGNOSIS — F410 Panic disorder [episodic paroxysmal anxiety] without agoraphobia: Secondary | ICD-10-CM

## 2021-05-16 ENCOUNTER — Other Ambulatory Visit: Payer: Self-pay | Admitting: Internal Medicine

## 2021-05-16 DIAGNOSIS — F410 Panic disorder [episodic paroxysmal anxiety] without agoraphobia: Secondary | ICD-10-CM

## 2021-05-16 NOTE — Telephone Encounter (Signed)
Requested Prescriptions  ?Pending Prescriptions Disp Refills  ?? busPIRone (BUSPAR) 7.5 MG tablet [Pharmacy Med Name: BUSPIRONE 7.'5MG'$  TABLETS] 90 tablet 0  ?  Sig: TAKE 1 TABLET(7.5 MG) BY MOUTH THREE TIMES DAILY  ?  ? Psychiatry: Anxiolytics/Hypnotics - Non-controlled Passed - 05/15/2021 10:18 AM  ?  ?  Passed - Valid encounter within last 12 months  ?  Recent Outpatient Visits   ?      ? 3 weeks ago Screening for colon cancer  ? [facility] [place], [place] W, NP  ? 3 months ago Benign essential HTN  ? [facility] [place], [place] W, NP  ? 4 months ago Viral URI with cough  ? [facility] [place], [place] W, NP  ? 9 months ago Peripheral edema  ? [facility] [place], [place] W, NP  ? 1 year ago COPD (chronic obstructive pulmonary disease) with chronic bronchitis ([place])  ? [place], DO  ?  ?  ? ?  ?  ?  ? ? ?

## 2021-05-16 NOTE — Telephone Encounter (Signed)
Medication Refill - Medication:  ?clonazePAM (KLONOPIN) 1 MG tablet  ? ?Has the patient contacted their pharmacy? Yes.   ?Contact PCP ? ?Preferred Pharmacy (with phone number or street name):  ? ?[facility] #[id number] Lorina Rabon, [place] AT [place]  ?[address] [place], [place] [address]  ?Phone:  [phone]  Fax:  [phone] ? ?Has the patient been seen for an appointment in the last year OR does the patient have an upcoming appointment? Yes.   ? ?Agent: Please be advised that RX refills may take up to 3 business days. We ask that you follow-up with your pharmacy. ?

## 2021-05-17 MED ORDER — CLONAZEPAM 1 MG PO TABS: ORAL_TABLET | ORAL | 0 refills | Status: DC

## 2021-05-17 NOTE — Telephone Encounter (Signed)
Requested medication (s) are due for refill today: Yes ? ?Requested medication (s) are on the active medication list: Yes ? ?Last refill:  04/20/21 ? ?Future visit scheduled: Yes ? ?Notes to clinic:  See request. ? ? ? ?Requested Prescriptions  ?Pending Prescriptions Disp Refills  ? clonazePAM (KLONOPIN) 1 MG tablet 75 tablet 0  ?  Sig: TAKE ONE TABLET BY MOUTH IN THE MORNING, ONE-HALF TABLET IN THE AFTERNOON AND TAKE ONE TABLET BY MOUTH AT BEDTIME  ?  ? Not Delegated - Psychiatry: Anxiolytics/Hypnotics 2 Failed - 05/16/2021 12:26 PM  ?  ?  Failed - This refill cannot be delegated  ?  ?  Failed - Urine Drug Screen completed in last 360 days  ?  ?  Passed - Patient is not pregnant  ?  ?  Passed - Valid encounter within last 6 months  ?  Recent Outpatient Visits   ? ?      ? 3 weeks ago Screening for colon cancer  ? [facility] [place], [place], NP  ? 3 months ago Benign essential HTN  ? [facility] [place], [place] W, NP  ? 4 months ago Viral URI with cough  ? [facility] [place], [place] W, NP  ? 9 months ago Peripheral edema  ? [facility] [place], [place] W, NP  ? 1 year ago COPD (chronic obstructive pulmonary disease) with chronic bronchitis ([place])  ? [place], DO  ? ?  ?  ?Future Appointments   ? ?        ? In 5 days Baity, Coralie Keens, NP [facility], [place]  ? ?  ? ?  ?  ?  ? ?

## 2021-05-20 ENCOUNTER — Other Ambulatory Visit: Payer: Self-pay | Admitting: Internal Medicine

## 2021-05-22 ENCOUNTER — Ambulatory Visit: Payer: PPO | Admitting: Internal Medicine

## 2021-05-22 ENCOUNTER — Other Ambulatory Visit: Payer: Self-pay

## 2021-05-22 DIAGNOSIS — E039 Hypothyroidism, unspecified: Secondary | ICD-10-CM

## 2021-05-22 NOTE — Telephone Encounter (Signed)
Requested Prescriptions  ?Pending Prescriptions Disp Refills  ?? levothyroxine (SYNTHROID) 200 MCG tablet [Pharmacy Med Name: LEVOTHYROXINE 0.'2MG'$  (200MCG) TAB] 30 tablet 0  ?  Sig: TAKE 1 TABLET(200 MCG) BY MOUTH DAILY  ?  ? Endocrinology:  Hypothyroid Agents Failed - 05/20/2021  3:34 AM  ?  ?  Failed - TSH in normal range and within 360 days  ?  TSH  ?Date Value Ref Range Status  ?04/20/2021 7.69 (H) 0.40 - 4.50 mIU/L Final  ?   ?  ?  Passed - Valid encounter within last 12 months  ?  Recent Outpatient Visits   ?      ? 1 month ago Screening for colon cancer  ? [facility] [place], [place] W, NP  ? 3 months ago Benign essential HTN  ? [facility] [place], [place] W, NP  ? 4 months ago Viral URI with cough  ? [facility] [place], [place] W, NP  ? 10 months ago Peripheral edema  ? [facility] [place], [place] W, NP  ? 1 year ago COPD (chronic obstructive pulmonary disease) with chronic bronchitis ([place])  ? [place], DO  ?  ?  ?Future Appointments   ?        ? Today Jearld Fenton, NP [facility], [place]  ?  ? ?  ?  ?  ? ? ?

## 2021-05-23 LAB — TSH: TSH: 0.56 mIU/L (ref 0.40–4.50)

## 2021-05-31 ENCOUNTER — Other Ambulatory Visit: Payer: Self-pay | Admitting: Internal Medicine

## 2021-05-31 DIAGNOSIS — F3341 Major depressive disorder, recurrent, in partial remission: Secondary | ICD-10-CM

## 2021-05-31 DIAGNOSIS — F410 Panic disorder [episodic paroxysmal anxiety] without agoraphobia: Secondary | ICD-10-CM

## 2021-06-01 NOTE — Telephone Encounter (Signed)
Requested Prescriptions  ?Pending Prescriptions Disp Refills  ?? citalopram (CELEXA) 40 MG tablet [Pharmacy Med Name: CITALOPRAM '40MG'$  TABLETS] 90 tablet 0  ?  Sig: TAKE ONE TABLET BY MOUTH ONCE DAILY  ?  ? Psychiatry:  Antidepressants - SSRI Passed - 05/31/2021  9:10 AM  ?  ?  Passed - Completed PHQ-2 or PHQ-9 in the last 360 days  ?  ?  Passed - Valid encounter within last 6 months  ?  Recent Outpatient Visits   ?      ? 1 month ago Screening for colon cancer  ? [facility] [place], [place], NP  ? 4 months ago Benign essential HTN  ? [facility] [place], [place] W, NP  ? 4 months ago Viral URI with cough  ? [facility] [place], [place] W, NP  ? 10 months ago Peripheral edema  ? [facility] [place], [place], NP  ? 1 year ago COPD (chronic obstructive pulmonary disease) with chronic bronchitis ([place])  ? [place], DO  ?  ?  ?Future Appointments   ?        ? In 1 month Baity, Coralie Keens, NP [facility], [place]  ?  ? ?  ?  ?  ? ?

## 2021-06-13 ENCOUNTER — Other Ambulatory Visit: Payer: Self-pay | Admitting: Internal Medicine

## 2021-06-13 DIAGNOSIS — F410 Panic disorder [episodic paroxysmal anxiety] without agoraphobia: Secondary | ICD-10-CM

## 2021-06-13 NOTE — Telephone Encounter (Signed)
Pt called in to request a refill for clonazePAM (KLONOPIN) 1 MG tablet .  ? ?Pharmacy:  ?[id number] [address], [address] Phone:  [phone]  ?Fax:  [phone]  ?  ? ? ?Future visit: 07/25/21 ? ? ?

## 2021-06-14 NOTE — Telephone Encounter (Signed)
Requested medication (s) are due for refill today:   Provider to review ? ?Requested medication (s) are on the active medication list:   Yes ? ?Future visit scheduled:   Yes ? ? ?Last ordered: 05/17/2021 #75, 0 refills ? ?Returned because it's a non delegated refill  ? ?Requested Prescriptions  ?Pending Prescriptions Disp Refills  ? clonazePAM (KLONOPIN) 1 MG tablet 75 tablet 0  ?  Sig: TAKE ONE TABLET BY MOUTH IN THE MORNING, ONE-HALF TABLET IN THE AFTERNOON AND TAKE ONE TABLET BY MOUTH AT BEDTIME  ?  ? Not Delegated - Psychiatry: Anxiolytics/Hypnotics 2 Failed - 06/13/2021 12:40 PM  ?  ?  Failed - This refill cannot be delegated  ?  ?  Failed - Urine Drug Screen completed in last 360 days  ?  ?  Passed - Patient is not pregnant  ?  ?  Passed - Valid encounter within last 6 months  ?  Recent Outpatient Visits   ? ?      ? 1 month ago Screening for colon cancer  ? [facility] [place], [place], NP  ? 4 months ago Benign essential HTN  ? [facility], [place] W, NP  ? 5 months ago Viral URI with cough  ? [facility] [place], [place] W, NP  ? 10 months ago Peripheral edema  ? [facility] [place], [place] W, NP  ? 1 year ago COPD (chronic obstructive pulmonary disease) with chronic bronchitis ([place])  ? [place], DO  ? ?  ?  ?Future Appointments   ? ?        ? In 1 month Baity, Coralie Keens, NP [facility], [place]  ? ?  ? ? ?  ?  ?  ? ?

## 2021-06-15 MED ORDER — CLONAZEPAM 1 MG PO TABS: ORAL_TABLET | ORAL | 0 refills | Status: DC

## 2021-06-16 ENCOUNTER — Other Ambulatory Visit: Payer: Self-pay | Admitting: Internal Medicine

## 2021-06-16 DIAGNOSIS — F410 Panic disorder [episodic paroxysmal anxiety] without agoraphobia: Secondary | ICD-10-CM

## 2021-06-16 NOTE — Telephone Encounter (Signed)
Requested Prescriptions  ?Pending Prescriptions Disp Refills  ?? busPIRone (BUSPAR) 7.5 MG tablet [Pharmacy Med Name: BUSPIRONE 7.'5MG'$  TABLETS] 90 tablet 0  ?  Sig: TAKE 1 TABLET(7.5 MG) BY MOUTH THREE TIMES DAILY  ?  ? Psychiatry: Anxiolytics/Hypnotics - Non-controlled Passed - 06/16/2021  8:10 AM  ?  ?  Passed - Valid encounter within last 12 months  ?  Recent Outpatient Visits   ?      ? 1 month ago Screening for colon cancer  ? [facility] [place], [place] W, NP  ? 4 months ago Benign essential HTN  ? [facility] [place], [place] W, NP  ? 5 months ago Viral URI with cough  ? [facility] [place], [place] W, NP  ? 10 months ago Peripheral edema  ? [facility] [place], [place] W, NP  ? 1 year ago COPD (chronic obstructive pulmonary disease) with chronic bronchitis ([place])  ? [place], DO  ?  ?  ?Future Appointments   ?        ? In 1 month Baity, Coralie Keens, NP [facility], [place]  ?  ? ?  ?  ?  ? ?

## 2021-06-26 ENCOUNTER — Other Ambulatory Visit: Payer: Self-pay | Admitting: Internal Medicine

## 2021-06-26 DIAGNOSIS — F410 Panic disorder [episodic paroxysmal anxiety] without agoraphobia: Secondary | ICD-10-CM

## 2021-06-27 NOTE — Telephone Encounter (Signed)
Refilled 06/16/2021 #90 0 refills. Requested Prescriptions  Pending Prescriptions Disp Refills  . busPIRone (BUSPAR) 7.5 MG tablet [Pharmacy Med Name: BUSPIRONE 7.'5MG'$  TABLETS] 90 tablet 0    Sig: TAKE 1 TABLET(7.5 MG) BY MOUTH THREE TIMES DAILY     Psychiatry: Anxiolytics/Hypnotics - Non-controlled Passed - 06/26/2021  6:26 AM      Passed - Valid encounter within last 12 months    Recent Outpatient Visits          2 months ago Screening for colon cancer   [facility] [place], Coralie Keens, NP   5 months ago Benign essential HTN   [facility] [place], Coralie Keens, NP   5 months ago Viral URI with cough   [facility] [place], Coralie Keens, NP   11 months ago Peripheral edema   [facility] [place], [place] W, NP   1 year ago COPD (chronic obstructive pulmonary disease) with chronic bronchitis ([place])   [facility] Olin Hauser, DO      Future Appointments            In 4 weeks Baity, Coralie Keens, NP [facility]

## 2021-07-12 ENCOUNTER — Other Ambulatory Visit: Payer: Self-pay | Admitting: Internal Medicine

## 2021-07-12 DIAGNOSIS — F410 Panic disorder [episodic paroxysmal anxiety] without agoraphobia: Secondary | ICD-10-CM

## 2021-07-12 NOTE — Telephone Encounter (Signed)
Requested medication (s) are due for refill today - yes  Requested medication (s) are on the active medication list -yes  Future visit scheduled -yes  Last refill: 06/15/21 #75  Notes to clinic: non delegated Rx  Requested Prescriptions  Pending Prescriptions Disp Refills   clonazePAM (KLONOPIN) 1 MG tablet [Pharmacy Med Name: CLONAZEPAM '1MG'$  TABLETS] 75 tablet     Sig: TAKE 1 TABLET BY MOUTH IN THE MORNING, 1/2 TABLET IN THE AFTERNOON, AND 1 TABLET AT BEDTIME     Not Delegated - Psychiatry: Anxiolytics/Hypnotics 2 Failed - 07/12/2021  9:28 AM      Failed - This refill cannot be delegated      Failed - Urine Drug Screen completed in last 360 days      Passed - Patient is not pregnant      Passed - Valid encounter within last 6 months    Recent Outpatient Visits           2 months ago Screening for colon cancer   [facility] [place], Coralie Keens, NP   5 months ago Benign essential HTN   [facility] [place], Coralie Keens, NP   6 months ago Viral URI with cough   [facility] [place], Coralie Keens, NP   11 months ago Peripheral edema   [facility] [place], [place] W, NP   1 year ago COPD (chronic obstructive pulmonary disease) with chronic bronchitis ([place])   [place], Devonne Doughty, DO       Future Appointments             In 1 week Baity, Coralie Keens, NP [facility]                Requested Prescriptions  Pending Prescriptions Disp Refills   clonazePAM (KLONOPIN) 1 MG tablet [Pharmacy Med Name: CLONAZEPAM '1MG'$  TABLETS] 75 tablet     Sig: TAKE 1 TABLET BY MOUTH IN THE MORNING, 1/2 TABLET IN THE AFTERNOON, AND 1 TABLET AT BEDTIME     Not Delegated - Psychiatry: Anxiolytics/Hypnotics 2 Failed - 07/12/2021  9:28 AM      Failed - This refill cannot be delegated      Failed - Urine Drug Screen completed in last 360 days      Passed - Patient is not pregnant      Passed - Valid  encounter within last 6 months    Recent Outpatient Visits           2 months ago Screening for colon cancer   [place], Coralie Keens, NP   5 months ago Benign essential HTN   [facility] [place], Coralie Keens, NP   6 months ago Viral URI with cough   [facility] [place], Coralie Keens, NP   11 months ago Peripheral edema   [facility] [place], [place] W, NP   1 year ago COPD (chronic obstructive pulmonary disease) with chronic bronchitis ([place])   [facility] Olin Hauser, DO       Future Appointments             In 1 week Baity, Coralie Keens, NP [facility], [facility]

## 2021-07-17 ENCOUNTER — Other Ambulatory Visit: Payer: Self-pay | Admitting: Internal Medicine

## 2021-07-17 DIAGNOSIS — I10 Essential (primary) hypertension: Secondary | ICD-10-CM

## 2021-07-17 DIAGNOSIS — F410 Panic disorder [episodic paroxysmal anxiety] without agoraphobia: Secondary | ICD-10-CM

## 2021-07-18 NOTE — Telephone Encounter (Signed)
Requested Prescriptions  Pending Prescriptions Disp Refills  . rosuvastatin (CRESTOR) 10 MG tablet [Pharmacy Med Name: ROSUVASTATIN '10MG'$  TABLETS] 90 tablet 2    Sig: TAKE 1 TABLET(10 MG) BY MOUTH DAILY     Cardiovascular:  Antilipid - Statins 2 Failed - 07/17/2021  8:05 AM      Failed - Lipid Panel in normal range within the last 12 months    Cholesterol, Total  Date Value Ref Range Status  02/03/2015 205 (H) 100 - 199 mg/dL Final   Cholesterol  Date Value Ref Range Status  04/20/2021 220 (H) <200 mg/dL Final   LDL Cholesterol (Calc)  Date Value Ref Range Status  04/20/2021 139 (H) mg/dL (calc) Final    Comment:    Reference range: <100 . Desirable range <100 mg/dL for primary prevention;   <70 mg/dL for patients with CHD or diabetic patients  with > or = 2 CHD risk factors. Marland Kitchen LDL-C is now calculated using the Martin-Hopkins  calculation, which is a validated novel method providing  better accuracy than the Friedewald equation in the  estimation of LDL-C.  Cresenciano Genre et al. Annamaria Helling. 9924;268(34): 2061-2068  (http://education.Quest[employer].com/faq/FAQ164)    HDL  Date Value Ref Range Status  04/20/2021 42 (L) > OR = 50 mg/dL Final  02/03/2015 36 (L) >39 mg/dL Final   Triglycerides  Date Value Ref Range Status  04/20/2021 242 (H) <150 mg/dL Final    Comment:    . If a non-fasting specimen was collected, consider repeat triglyceride testing on a fasting specimen if clinically indicated.  Yates Decamp et al. J. of Clin. Lipidol. 1962;2:297-989. Marland Kitchen          Passed - Cr in normal range and within 360 days    Creat  Date Value Ref Range Status  04/20/2021 0.91 0.60 - 1.00 mg/dL Final         Passed - Patient is not pregnant      Passed - Valid encounter within last 12 months    Recent Outpatient Visits          2 months ago Screening for colon cancer   [facility] [place], Coralie Keens, NP   5 months ago Benign essential HTN   [facility]  [place], Coralie Keens, NP   6 months ago Viral URI with cough   [facility] [place], Coralie Keens, NP   11 months ago Peripheral edema   [facility] [place], [place] W, NP   1 year ago COPD (chronic obstructive pulmonary disease) with chronic bronchitis ([place])   [place], DO      Future Appointments            In 1 week Garnette Gunner, Coralie Keens, NP [facility]           . carvedilol (COREG) 3.125 MG tablet [Pharmacy Med Name: CARVEDILOL 3.'125MG'$  TABLETS] 180 tablet 0    Sig: TAKE 1 TABLET BY MOUTH TWICE DAILY WITH A MEAL     Cardiovascular: Beta Blockers 3 Passed - 07/17/2021  8:05 AM      Passed - Cr in normal range and within 360 days    Creat  Date Value Ref Range Status  04/20/2021 0.91 0.60 - 1.00 mg/dL Final         Passed - AST in normal range and within 360 days    AST  Date Value Ref Range Status  04/20/2021 18 10 -  35 U/L Final         Passed - ALT in normal range and within 360 days    ALT  Date Value Ref Range Status  04/20/2021 14 6 - 29 U/L Final         Passed - Last BP in normal range    BP Readings from Last 1 Encounters:  04/20/21 126/70         Passed - Last Heart Rate in normal range    Pulse Readings from Last 1 Encounters:  04/20/21 92         Passed - Valid encounter within last 6 months    Recent Outpatient Visits          2 months ago Screening for colon cancer   [facility] [place], Coralie Keens, NP   5 months ago Benign essential HTN   [facility] [place], Coralie Keens, NP   6 months ago Viral URI with cough   [facility] [place], Coralie Keens, NP   11 months ago Peripheral edema   [facility] [place], [place] W, NP   1 year ago COPD (chronic obstructive pulmonary disease) with chronic bronchitis ([place])   [place], Devonne Doughty, DO      Future Appointments            In 1 week Garnette Gunner,  Coralie Keens, NP [facility]           . busPIRone (BUSPAR) 7.5 MG tablet [Pharmacy Med Name: BUSPIRONE 7.'5MG'$  TABLETS] 90 tablet 2    Sig: TAKE 1 TABLET(7.5 MG) BY MOUTH THREE TIMES DAILY     Psychiatry: Anxiolytics/Hypnotics - Non-controlled Passed - 07/17/2021  8:05 AM      Passed - Valid encounter within last 12 months    Recent Outpatient Visits          2 months ago Screening for colon cancer   [facility] [place], Coralie Keens, NP   5 months ago Benign essential HTN   [facility] [place], Coralie Keens, NP   6 months ago Viral URI with cough   [facility] [place], Coralie Keens, NP   11 months ago Peripheral edema   [facility] [place], [place] W, NP   1 year ago COPD (chronic obstructive pulmonary disease) with chronic bronchitis ([place])   [place], Devonne Doughty, DO      Future Appointments            In 1 week Baity, Coralie Keens, NP [facility]

## 2021-07-25 ENCOUNTER — Encounter: Payer: Self-pay | Admitting: Internal Medicine

## 2021-07-25 ENCOUNTER — Ambulatory Visit (INDEPENDENT_AMBULATORY_CARE_PROVIDER_SITE_OTHER): Payer: PPO | Admitting: Internal Medicine

## 2021-07-25 VITALS — BP 124/68 | HR 71 | Temp 96.8°F | Wt 202.0 lb

## 2021-07-25 DIAGNOSIS — M150 Primary generalized (osteo)arthritis: Secondary | ICD-10-CM

## 2021-07-25 DIAGNOSIS — F419 Anxiety disorder, unspecified: Secondary | ICD-10-CM

## 2021-07-25 DIAGNOSIS — E039 Hypothyroidism, unspecified: Secondary | ICD-10-CM

## 2021-07-25 DIAGNOSIS — J432 Centrilobular emphysema: Secondary | ICD-10-CM

## 2021-07-25 DIAGNOSIS — I10 Essential (primary) hypertension: Secondary | ICD-10-CM

## 2021-07-25 DIAGNOSIS — K219 Gastro-esophageal reflux disease without esophagitis: Secondary | ICD-10-CM

## 2021-07-25 DIAGNOSIS — E782 Mixed hyperlipidemia: Secondary | ICD-10-CM | POA: Diagnosis not present

## 2021-07-25 DIAGNOSIS — I70219 Atherosclerosis of native arteries of extremities with intermittent claudication, unspecified extremity: Secondary | ICD-10-CM

## 2021-07-25 DIAGNOSIS — M159 Polyosteoarthritis, unspecified: Secondary | ICD-10-CM

## 2021-07-25 DIAGNOSIS — R7303 Prediabetes: Secondary | ICD-10-CM

## 2021-07-25 DIAGNOSIS — F99 Mental disorder, not otherwise specified: Secondary | ICD-10-CM

## 2021-07-25 DIAGNOSIS — F410 Panic disorder [episodic paroxysmal anxiety] without agoraphobia: Secondary | ICD-10-CM

## 2021-07-25 DIAGNOSIS — I6523 Occlusion and stenosis of bilateral carotid arteries: Secondary | ICD-10-CM

## 2021-07-25 DIAGNOSIS — I700 Atherosclerosis of aorta: Secondary | ICD-10-CM

## 2021-07-25 DIAGNOSIS — L409 Psoriasis, unspecified: Secondary | ICD-10-CM

## 2021-07-25 DIAGNOSIS — F5105 Insomnia due to other mental disorder: Secondary | ICD-10-CM

## 2021-07-25 DIAGNOSIS — Z79899 Other long term (current) drug therapy: Secondary | ICD-10-CM

## 2021-07-25 DIAGNOSIS — F32A Depression, unspecified: Secondary | ICD-10-CM

## 2021-07-25 DIAGNOSIS — F3341 Major depressive disorder, recurrent, in partial remission: Secondary | ICD-10-CM

## 2021-07-25 MED ORDER — ASPIRIN 81 MG PO TBEC: 81.0000 mg | DELAYED_RELEASE_TABLET | Freq: Every day | ORAL | 1 refills | Status: DC

## 2021-07-25 NOTE — Assessment & Plan Note (Signed)
Stable on citalopram, buspirone, hydroxyzine and clonazepam CSA today Support offered

## 2021-07-25 NOTE — Assessment & Plan Note (Signed)
CMET and lipid profile today Encouraged her to consume a low fat diet Continue Rosuvastatin 

## 2021-07-25 NOTE — Assessment & Plan Note (Signed)
Controlled on Carvedilol, Diltiazem and Lisinopril Reinforced DASH diet and exercise for weight loss CMET today

## 2021-07-25 NOTE — Assessment & Plan Note (Signed)
Continue clonazepam as needed CSA and UDS today 

## 2021-07-25 NOTE — Assessment & Plan Note (Signed)
CMET and lipid profile today Encouraged her to consume a low fat diet Continue Rosuvastatin, Carvedilol, Lisinopril and

## 2021-07-25 NOTE — Progress Notes (Signed)
Subjective:    Patient ID: [name], female    DOB: 1950/09/07, 70 y.o.   MRN: [id number]  HPI  Patient presents to clinic today for 53-monthfollow-up of chronic conditions.  HTN: Her BP today is 124/68.  She is taking Carvedilol, Diltiazem and Lisinopril as prescribed.  ECG from 11/2020 reviewed.  HLD with Carotid/Thoracic Atherosclerosis, PAD with Claudication: Her last LDL was 139, triglycerides 242, 04/2021.  She denies myalgias on Rosuvastatin. She is not currently taking Aspirin. She does not consume a low-fat diet.  She follows with vascular.  COPD: She reports chronic cough and shortness of breath.  She is taking Breo and Albuterol as prescribed.  There are no PFTs on file.  She does not follow with pulmonology.  She no longer smokes.  OA: Mainly in her back and hips.  She takes Tylenol OTC with some relief of symptoms.  She does not follow with orthopedics.  Depression, Anxiety and Panic Attacks: Chronic, managed on Citalopram, Buspirone, Hydroxyzine and Clonazepam.  She is not seeing a therapist or psychiatry.  She denies SI/HI.  GERD: Currently not an issue.  She is not taking any medications for this at this time  Insomnia: She has difficulty staying asleep.  She takes Clonazepam at bedtime with good relief of symptoms.  There is no sleep study on file.  Prediabetes: Her last A1c was 6.4%, 04/2021.  She is not taking any oral diabetic medication at this time.  She does not check her sugars.  Hypothyroidism: She denies any issues on her current dose of Levothyroxine which was adjusted after her last visit.  She does not follow with endocrinology.  Psoriasis: Managed on Tremfya.  She follows with dermatology.  Review of Systems     Past Medical History:  Diagnosis Date   Anxiety    Arthritis    Chronic back pain    COPD (chronic obstructive pulmonary disease) (HCC)    Depression    GERD (gastroesophageal reflux disease)    Hypertension    Hypothyroidism     Pneumonia    RECENT X 3   PONV (postoperative nausea and vomiting)    Psoriasis    Thyroid disease     Current Outpatient Medications  Medication Sig Dispense Refill   acetaminophen (TYLENOL) 500 MG tablet Take 500 mg by mouth every 6 (six) hours as needed for moderate pain.      albuterol (VENTOLIN HFA) 108 (90 Base) MCG/ACT inhaler INHALE 2 PUFFS BY MOUTH INTO THE LUNGS EVERY 6 HOURS AS NEEDED FOR WHEEZING OR SHORTNESS 8.5 g 2   busPIRone (BUSPAR) 7.5 MG tablet TAKE 1 TABLET(7.5 MG) BY MOUTH THREE TIMES DAILY 90 tablet 2   carvedilol (COREG) 3.125 MG tablet TAKE 1 TABLET BY MOUTH TWICE DAILY WITH A MEAL 180 tablet 0   citalopram (CELEXA) 40 MG tablet TAKE ONE TABLET BY MOUTH ONCE DAILY 90 tablet 0   clobetasol cream (TEMOVATE) 02.53% Apply 1 application. topically 2 (two) times daily.     clonazePAM (KLONOPIN) 1 MG tablet TAKE 1 TABLET BY MOUTH IN THE MORNING, 1/2 TABLET IN THE AFTERNOON, AND 1 TABLET AT BEDTIME 75 tablet 0   diltiazem (CARDIZEM CD) 120 MG 24 hr capsule TAKE ONE CAPSULE BY MOUTH ONCE DAILY. 90 capsule 0   fluticasone furoate-vilanterol (BREO ELLIPTA) 100-25 MCG/ACT AEPB Inhale 1 puff into the lungs daily. 1 each 11   hydrOXYzine (ATARAX) 25 MG tablet Take 25 mg by mouth at bedtime.  ipratropium-albuterol (DUONEB) 0.5-2.5 (3) MG/3ML SOLN Take 3 mLs by nebulization every 6 (six) hours as needed. 360 mL    levothyroxine (SYNTHROID) 200 MCG tablet TAKE 1 TABLET(200 MCG) BY MOUTH DAILY 90 tablet 1   lisinopril (ZESTRIL) 10 MG tablet Take 1 tablet (10 mg total) by mouth daily. 90 tablet 0   Misc. Devices (QUAD CANE/SMALL BASE) MISC 1 Device by Does not apply route daily. 1 each 0   rosuvastatin (CRESTOR) 10 MG tablet TAKE 1 TABLET(10 MG) BY MOUTH DAILY 90 tablet 2   Vitamin D, Ergocalciferol, (DRISDOL) 1.25 MG (50000 UNIT) CAPS capsule Take 50,000 Units by mouth 2 (two) times a week.     No current facility-administered medications for this visit.    Allergies  Allergen  Reactions   Ciprofloxacin Hives and Diarrhea   Mucinex [Guaifenesin Er] Nausea Only    Per pt   Oxycodone Diarrhea   Oxycodone-Acetaminophen Diarrhea   Oxycodone-Acetaminophen Diarrhea   Sulfa Antibiotics Rash    Throat, mouth Throat, mouth    Family History  Problem Relation Age of Onset   Heart disease Mother    Breast cancer Mother    Heart attack Mother    COPD Father    Stroke Paternal Uncle    Kidney disease Sister    Breast cancer Cousin    Breast cancer Cousin    Heart attack Maternal Aunt    Stroke Paternal Grandfather    Kidney cancer Neg Hx    Bladder Cancer Neg Hx     Social History   Socioeconomic History   Marital status: Widowed    Spouse name: Not on file   Number of children: Not on file   Years of education: Not on file   Highest education level: Not on file  Occupational History   Occupation: retired  Tobacco Use   Smoking status: Former    Packs/day: 1.00    Years: 35.00    Total pack years: 35.00    Types: Cigarettes    Start date: 04/19/2018    Quit date: 11/16/2020    Years since quitting: 0.6   Smokeless tobacco: Never   Tobacco comments:    Previously smoked 2 ppd.   Vaping Use   Vaping Use: Never used  Substance and Sexual Activity   Alcohol use: No   Drug use: No   Sexual activity: Not Currently    Birth control/protection: Surgical  Other Topics Concern   Not on file  Social History Narrative   Not on file   Social Determinants of Health   Financial Resource Strain: Low Risk  (04/21/2021)   Overall Financial Resource Strain (CARDIA)    Difficulty of Paying Living Expenses: Not hard at all  Food Insecurity: No Food Insecurity (04/21/2021)   Hunger Vital Sign    Worried About Running Out of Food in the Last Year: Never true    Ran Out of Food in the Last Year: Never true  Transportation Needs: No Transportation Needs (04/21/2021)   PRAPARE - Hydrologist (Medical): No    Lack of Transportation  (Non-Medical): No  Physical Activity: Insufficiently Active (04/21/2021)   Exercise Vital Sign    Days of Exercise per Week: 2 days    Minutes of Exercise per Session: 30 min  Stress: No Stress Concern Present (04/21/2021)   [place]    Feeling of Stress : Only a little  Social Connections: Unknown (04/21/2021)  Social Connection and Isolation Panel [NHANES]    Frequency of Communication with Friends and Family: More than three times a week    Frequency of Social Gatherings with Friends and Family: Three times a week    Attends Religious Services: Never    Active Member of Clubs or Organizations: No    Attends Archivist Meetings: Never    Marital Status: Not on file  Intimate Partner Violence: Not At Risk (04/21/2021)   Humiliation, Afraid, Rape, and Kick questionnaire    Fear of Current or Ex-Partner: No    Emotionally Abused: No    Physically Abused: No    Sexually Abused: No     Constitutional: Denies fever, malaise, fatigue, headache or abrupt weight changes.  HEENT: Denies eye pain, eye redness, ear pain, ringing in the ears, wax buildup, runny nose, nasal congestion, bloody nose, or sore throat. Respiratory: Patient reports chronic cough and shortness of breath.  Denies difficulty breathing, or sputum production.   Cardiovascular: Denies chest pain, chest tightness, palpitations or swelling in the hands or feet.  Gastrointestinal: Denies abdominal pain, bloating, constipation, diarrhea or blood in the stool.  GU: Denies urgency, frequency, pain with urination, burning sensation, blood in urine, odor or discharge. Musculoskeletal: Patient reports joint pain.  Denies decrease in range of motion, difficulty with gait, muscle pain or joint swelling.  Skin: Denies redness, rashes, lesions or ulcercations.  Neurological: Patient reports insomnia.  Denies dizziness, difficulty with memory, difficulty with  speech or problems with balance and coordination.  Psych: Patient has a history of anxiety and depression.  Denies SI/HI.  No other specific complaints in a complete review of systems (except as listed in HPI above).  Objective:   Physical Exam  BP 124/68 (BP Location: Left Arm, Patient Position: Sitting, Cuff Size: Large)   Pulse 71   Temp (!) 96.8 F (36 C) (Temporal)   Wt 202 lb (91.6 kg)   SpO2 96%   BMI 35.78 kg/m   Wt Readings from Last 3 Encounters:  04/21/21 200 lb (90.7 kg)  04/20/21 200 lb (90.7 kg)  01/11/21 196 lb 12.8 oz (89.3 kg)    General: Appears her stated age, obese, in NAD. Skin: Warm, dry and intact.  HEENT: Head: normal shape and size; Eyes: sclera white, no icterus, conjunctiva pink, PERRLA and EOMs intact;  Neck:  Neck supple, trachea midline. No masses, lumps or thyromegaly present.  Cardiovascular: Normal rate and rhythm. S1,S2 noted.  No murmur, rubs or gallops noted. No JVD or BLE edema. No carotid bruits noted. Pulmonary/Chest: Normal effort and positive vesicular breath sounds. No respiratory distress. No wheezes, rales or ronchi noted.  Abdomen: Soft and nontender. Normal bowel sounds.  Musculoskeletal: No difficulty with gait.  Neurological: Alert and oriented.  Psychiatric: Mood and affect normal. Behavior is normal. Judgment and thought content normal.     BMET    Component Value Date/Time   NA 142 04/20/2021 1449   NA 142 06/24/2015 1102   NA 135 (L) 02/02/2013 1021   K 4.1 04/20/2021 1449   K 3.7 02/02/2013 1021   CL 100 04/20/2021 1449   CL 103 02/02/2013 1021   CO2 31 04/20/2021 1449   CO2 30 02/02/2013 1021   GLUCOSE 130 04/20/2021 1449   GLUCOSE 152 (H) 02/02/2013 1021   BUN 13 04/20/2021 1449   BUN 12 06/24/2015 1102   BUN 8 02/02/2013 1021   CREATININE 0.91 04/20/2021 1449   CALCIUM 9.8 04/20/2021 1449  CALCIUM 9.9 02/02/2013 1021   GFRNONAA >60 12/09/2020 0701   GFRNONAA 72 12/03/2019 0947   GFRAA 84 12/03/2019 0947     Lipid Panel     Component Value Date/Time   CHOL 220 (H) 04/20/2021 1449   CHOL 205 (H) 02/03/2015 1022   TRIG 242 (H) 04/20/2021 1449   HDL 42 (L) 04/20/2021 1449   HDL 36 (L) 02/03/2015 1022   CHOLHDL 5.2 (H) 04/20/2021 1449   LDLCALC 139 (H) 04/20/2021 1449    CBC    Component Value Date/Time   WBC 8.8 04/20/2021 1449   RBC 5.10 04/20/2021 1449   HGB 15.4 04/20/2021 1449   HGB 15.6 06/24/2015 1102   HCT 47.9 (H) 04/20/2021 1449   HCT 46.6 06/24/2015 1102   PLT 255 04/20/2021 1449   PLT 307 06/24/2015 1102   MCV 93.9 04/20/2021 1449   MCV 93 06/24/2015 1102   MCV 90 02/02/2013 1021   MCH 30.2 04/20/2021 1449   MCHC 32.2 04/20/2021 1449   RDW 11.8 04/20/2021 1449   RDW 13.3 06/24/2015 1102   RDW 13.2 02/02/2013 1021   LYMPHSABS 2.2 12/02/2020 1612   LYMPHSABS 2.3 06/24/2015 1102   MONOABS 0.8 12/02/2020 1612   EOSABS 0.2 12/02/2020 1612   EOSABS 0.3 06/24/2015 1102   BASOSABS 0.1 12/02/2020 1612   BASOSABS 0.1 06/24/2015 1102    Hgb A1C Lab Results  Component Value Date   HGBA1C 6.4 (H) 04/20/2021            Assessment & Plan:     [name], NP

## 2021-07-25 NOTE — Assessment & Plan Note (Signed)
Currently not an issue °We will monitor °

## 2021-07-25 NOTE — Assessment & Plan Note (Signed)
Continue Brea and Albuterol Will monitor

## 2021-07-25 NOTE — Assessment & Plan Note (Signed)
Continue Tylenol OTC as needed Encourage weight loss as this can help reduce joint pain

## 2021-07-25 NOTE — Patient Instructions (Signed)

## 2021-07-25 NOTE — Assessment & Plan Note (Signed)
CMET and lipid profile today Encouraged her to consume a low fat diet Continue Rosuvastatin, Carvedilol, Lisinopril and ASA

## 2021-07-25 NOTE — Assessment & Plan Note (Signed)
A1C today Encouraged low carb diet and exercise for weight loss 

## 2021-07-25 NOTE — Assessment & Plan Note (Signed)
TSH and free T4 today We will adjust Levothyroxine if needed based on labs 

## 2021-07-25 NOTE — Assessment & Plan Note (Signed)
Continue Tremfya as prescribed by dermatology

## 2021-07-26 ENCOUNTER — Other Ambulatory Visit: Payer: Self-pay | Admitting: Internal Medicine

## 2021-07-26 DIAGNOSIS — I10 Essential (primary) hypertension: Secondary | ICD-10-CM

## 2021-07-26 LAB — COMPLETE METABOLIC PANEL WITH GFR
AG Ratio: 1.5 (calc) (ref 1.0–2.5)
ALT: 11 U/L (ref 6–29)
AST: 16 U/L (ref 10–35)
Albumin: 3.8 g/dL (ref 3.6–5.1)
Alkaline phosphatase (APISO): 81 U/L (ref 37–153)
BUN: 15 mg/dL (ref 7–25)
CO2: 34 mmol/L — ABNORMAL HIGH (ref 20–32)
Calcium: 9.6 mg/dL (ref 8.6–10.4)
Chloride: 99 mmol/L (ref 98–110)
Creat: 0.91 mg/dL (ref 0.60–1.00)
Globulin: 2.5 g/dL (calc) (ref 1.9–3.7)
Glucose, Bld: 141 mg/dL — ABNORMAL HIGH (ref 65–139)
Potassium: 4.4 mmol/L (ref 3.5–5.3)
Sodium: 142 mmol/L (ref 135–146)
Total Bilirubin: 0.6 mg/dL (ref 0.2–1.2)
Total Protein: 6.3 g/dL (ref 6.1–8.1)
eGFR: 67 mL/min/{1.73_m2} (ref >=60)

## 2021-07-26 LAB — CBC
HCT: 48.6 % — ABNORMAL HIGH (ref 35.0–45.0)
Hemoglobin: 15.5 g/dL (ref 11.7–15.5)
MCH: 30.1 pg (ref 27.0–33.0)
MCHC: 31.9 g/dL — ABNORMAL LOW (ref 32.0–36.0)
MCV: 94.4 fL (ref 80.0–100.0)
MPV: 11.1 fL (ref 7.5–12.5)
Platelets: 227 10*3/uL (ref 140–400)
RBC: 5.15 10*6/uL — ABNORMAL HIGH (ref 3.80–5.10)
RDW: 11.9 % (ref 11.0–15.0)
WBC: 8 10*3/uL (ref 3.8–10.8)

## 2021-07-26 LAB — TSH: TSH: 3.8 mIU/L (ref 0.40–4.50)

## 2021-07-26 LAB — LIPID PANEL
Cholesterol: 115 mg/dL (ref <200)
HDL: 43 mg/dL — ABNORMAL LOW (ref >=50)
LDL Cholesterol (Calc): 47 mg/dL (calc)
Non-HDL Cholesterol (Calc): 72 mg/dL (calc) (ref <130)
Total CHOL/HDL Ratio: 2.7 (calc) (ref <5.0)
Triglycerides: 174 mg/dL — ABNORMAL HIGH (ref <150)

## 2021-07-26 LAB — T4, FREE: Free T4: 1.3 ng/dL (ref 0.8–1.8)

## 2021-07-26 LAB — HEMOGLOBIN A1C
Hgb A1c MFr Bld: 6.6 % of total Hgb — ABNORMAL HIGH (ref <5.7)
Mean Plasma Glucose: 143 mg/dL
eAG (mmol/L): 7.9 mmol/L

## 2021-07-26 NOTE — Telephone Encounter (Signed)
Medication was refilled 07/18/21 by PCP for 180, 0 refills. This is a duplicate request, will refuse this request.  Requested Prescriptions  Pending Prescriptions Disp Refills  . carvedilol (COREG) 3.125 MG tablet [Pharmacy Med Name: CARVEDILOL 3.'125MG'$  TABLETS] 180 tablet 0    Sig: TAKE 1 TABLET BY MOUTH TWICE DAILY WITH A MEAL     Cardiovascular: Beta Blockers 3 Passed - 07/26/2021  6:28 AM      Passed - Cr in normal range and within 360 days    Creat  Date Value Ref Range Status  07/25/2021 0.91 0.60 - 1.00 mg/dL Final         Passed - AST in normal range and within 360 days    AST  Date Value Ref Range Status  07/25/2021 16 10 - 35 U/L Final         Passed - ALT in normal range and within 360 days    ALT  Date Value Ref Range Status  07/25/2021 11 6 - 29 U/L Final         Passed - Last BP in normal range    BP Readings from Last 1 Encounters:  07/25/21 124/68         Passed - Last Heart Rate in normal range    Pulse Readings from Last 1 Encounters:  07/25/21 71         Passed - Valid encounter within last 6 months    Recent Outpatient Visits          Yesterday Acquired hypothyroidism   [facility], Coralie Keens, NP   3 months ago Screening for colon cancer   [facility] [place], Coralie Keens, NP   6 months ago Benign essential HTN   [facility] [place], Coralie Keens, NP   6 months ago Viral URI with cough   [facility] [place], Coralie Keens, NP   1 year ago Peripheral edema   [facility] [place], Coralie Keens, NP             Signed Prescriptions Disp Refills   diltiazem (CARDIZEM CD) 120 MG 24 hr capsule 90 capsule 0    Sig: TAKE 1 CAPSULE BY MOUTH EVERY DAY     Cardiovascular: Calcium Channel Blockers 3 Passed - 07/26/2021  6:28 AM      Passed - ALT in normal range and within 360 days    ALT  Date Value Ref Range Status  07/25/2021 11 6 - 29 U/L Final         Passed - AST in normal range and  within 360 days    AST  Date Value Ref Range Status  07/25/2021 16 10 - 35 U/L Final         Passed - Cr in normal range and within 360 days    Creat  Date Value Ref Range Status  07/25/2021 0.91 0.60 - 1.00 mg/dL Final         Passed - Last BP in normal range    BP Readings from Last 1 Encounters:  07/25/21 124/68         Passed - Last Heart Rate in normal range    Pulse Readings from Last 1 Encounters:  07/25/21 71         Passed - Valid encounter within last 6 months    Recent Outpatient Visits          Yesterday Acquired hypothyroidism  [facility] [place], [place] W, NP   3 months ago Screening for colon cancer   [facility] [place], Coralie Keens, NP   6 months ago Benign essential HTN   [facility] [place], [place], NP   6 months ago Viral URI with cough   [facility] [place], [place], NP   1 year ago Peripheral edema   [facility] [place], [place] W, NP              lisinopril (ZESTRIL) 10 MG tablet 90 tablet 0    Sig: TAKE 1 TABLET(10 MG) BY MOUTH DAILY     Cardiovascular:  ACE Inhibitors Passed - 07/26/2021  6:28 AM      Passed - Cr in normal range and within 180 days    Creat  Date Value Ref Range Status  07/25/2021 0.91 0.60 - 1.00 mg/dL Final         Passed - K in normal range and within 180 days    Potassium  Date Value Ref Range Status  07/25/2021 4.4 3.5 - 5.3 mmol/L Final  02/02/2013 3.7 3.5 - 5.1 mmol/L Final         Passed - Patient is not pregnant      Passed - Last BP in normal range    BP Readings from Last 1 Encounters:  07/25/21 124/68         Passed - Valid encounter within last 6 months    Recent Outpatient Visits          Yesterday Acquired hypothyroidism   [place], Coralie Keens, NP   3 months ago Screening for colon cancer   [place], NP   6 months ago Benign essential HTN   [facility] [place], Coralie Keens, NP   6 months ago Viral URI with cough   [facility] [place], Coralie Keens, NP   1 year ago Peripheral edema   [facility] [place], Coralie Keens, NP

## 2021-07-26 NOTE — Telephone Encounter (Signed)
Requested Prescriptions  Pending Prescriptions Disp Refills  . diltiazem (CARDIZEM CD) 120 MG 24 hr capsule [Pharmacy Med Name: DILTIAZEM CD '120MG'$  CAPSULES (24 HR)] 90 capsule 0    Sig: TAKE 1 CAPSULE BY MOUTH EVERY DAY     Cardiovascular: Calcium Channel Blockers 3 Passed - 07/26/2021  6:28 AM      Passed - ALT in normal range and within 360 days    ALT  Date Value Ref Range Status  07/25/2021 11 6 - 29 U/L Final         Passed - AST in normal range and within 360 days    AST  Date Value Ref Range Status  07/25/2021 16 10 - 35 U/L Final         Passed - Cr in normal range and within 360 days    Creat  Date Value Ref Range Status  07/25/2021 0.91 0.60 - 1.00 mg/dL Final         Passed - Last BP in normal range    BP Readings from Last 1 Encounters:  07/25/21 124/68         Passed - Last Heart Rate in normal range    Pulse Readings from Last 1 Encounters:  07/25/21 71         Passed - Valid encounter within last 6 months    Recent Outpatient Visits          Yesterday Acquired hypothyroidism   [facility] [place], Coralie Keens, NP   3 months ago Screening for colon cancer   [facility] [place], Coralie Keens, NP   6 months ago Benign essential HTN   [facility] [place], [place], NP   6 months ago Viral URI with cough   [facility] [place], [place], NP   1 year ago Peripheral edema   [facility] [place], [place] W, NP             . lisinopril (ZESTRIL) 10 MG tablet [Pharmacy Med Name: LISINOPRIL '10MG'$  TABLETS] 90 tablet 0    Sig: TAKE 1 TABLET(10 MG) BY MOUTH DAILY     Cardiovascular:  ACE Inhibitors Passed - 07/26/2021  6:28 AM      Passed - Cr in normal range and within 180 days    Creat  Date Value Ref Range Status  07/25/2021 0.91 0.60 - 1.00 mg/dL Final         Passed - K in normal range and within 180 days    Potassium  Date Value Ref Range Status  07/25/2021 4.4 3.5 - 5.3 mmol/L Final   02/02/2013 3.7 3.5 - 5.1 mmol/L Final         Passed - Patient is not pregnant      Passed - Last BP in normal range    BP Readings from Last 1 Encounters:  07/25/21 124/68         Passed - Valid encounter within last 6 months    Recent Outpatient Visits          Yesterday Acquired hypothyroidism   [place], Coralie Keens, NP   3 months ago Screening for colon cancer   [facility] [place], Coralie Keens, NP   6 months ago Benign essential HTN   [facility] [place], Coralie Keens, NP   6 months ago Viral URI with cough   [facility] [place], Coralie Keens, [place]  1 year ago Peripheral edema   [facility] [place], [place] W, NP             . carvedilol (COREG) 3.125 MG tablet [Pharmacy Med Name: CARVEDILOL 3.'125MG'$  TABLETS] 180 tablet 0    Sig: TAKE 1 TABLET BY MOUTH TWICE DAILY WITH A MEAL     Cardiovascular: Beta Blockers 3 Passed - 07/26/2021  6:28 AM      Passed - Cr in normal range and within 360 days    Creat  Date Value Ref Range Status  07/25/2021 0.91 0.60 - 1.00 mg/dL Final         Passed - AST in normal range and within 360 days    AST  Date Value Ref Range Status  07/25/2021 16 10 - 35 U/L Final         Passed - ALT in normal range and within 360 days    ALT  Date Value Ref Range Status  07/25/2021 11 6 - 29 U/L Final         Passed - Last BP in normal range    BP Readings from Last 1 Encounters:  07/25/21 124/68         Passed - Last Heart Rate in normal range    Pulse Readings from Last 1 Encounters:  07/25/21 71         Passed - Valid encounter within last 6 months    Recent Outpatient Visits          Yesterday Acquired hypothyroidism   [place], Coralie Keens, NP   3 months ago Screening for colon cancer   [place], NP   6 months ago Benign essential HTN   [facility] [place], Coralie Keens, NP   6 months ago  Viral URI with cough   [facility] [place], Coralie Keens, NP   1 year ago Peripheral edema   [facility] [place], Coralie Keens, NP

## 2021-07-27 LAB — DRUG MONITORING, PANEL 8 WITH CONFIRMATION, URINE
6 Acetylmorphine: NEGATIVE ng/mL (ref <10)
Alcohol Metabolites: NEGATIVE ng/mL (ref <500)
Alphahydroxyalprazolam: NEGATIVE ng/mL (ref <25)
Alphahydroxymidazolam: NEGATIVE ng/mL (ref <50)
Alphahydroxytriazolam: NEGATIVE ng/mL (ref <50)
Aminoclonazepam: 405 ng/mL — ABNORMAL HIGH (ref <25)
Amphetamines: NEGATIVE ng/mL (ref <500)
Benzodiazepines: POSITIVE ng/mL — AB (ref <100)
Buprenorphine, Urine: NEGATIVE ng/mL (ref <5)
Cocaine Metabolite: NEGATIVE ng/mL (ref <150)
Creatinine: 32.4 mg/dL (ref >=20.0)
Hydroxyethylflurazepam: NEGATIVE ng/mL (ref <50)
Lorazepam: NEGATIVE ng/mL (ref <50)
MDMA: NEGATIVE ng/mL (ref <500)
Marijuana Metabolite: NEGATIVE ng/mL (ref <20)
Nordiazepam: NEGATIVE ng/mL (ref <50)
Opiates: NEGATIVE ng/mL (ref <100)
Oxazepam: NEGATIVE ng/mL (ref <50)
Oxidant: NEGATIVE ug/mL (ref <200)
Oxycodone: NEGATIVE ng/mL (ref <100)
Temazepam: NEGATIVE ng/mL (ref <50)
pH: 7 (ref 4.5–9.0)

## 2021-07-27 LAB — DM TEMPLATE

## 2021-08-09 ENCOUNTER — Other Ambulatory Visit: Payer: Self-pay | Admitting: Internal Medicine

## 2021-08-09 DIAGNOSIS — F410 Panic disorder [episodic paroxysmal anxiety] without agoraphobia: Secondary | ICD-10-CM

## 2021-08-09 NOTE — Telephone Encounter (Signed)
Requested medication (s) are due for refill today - yes  Requested medication (s) are on the active medication list -yes  Future visit scheduled -no  Last refill: 07/13/21 #75  Notes to clinic: non delegated Rx  Requested Prescriptions  Pending Prescriptions Disp Refills   clonazePAM (KLONOPIN) 1 MG tablet [Pharmacy Med Name: CLONAZEPAM '1MG'$  TABLETS] 75 tablet     Sig: TAKE 1 TABLET BY MOUTH IN THE MORNING, 1/2 TABLET IN THE AFTERNOON, AND 1 TABLET AT BEDTIME     Not Delegated - Psychiatry: Anxiolytics/Hypnotics 2 Failed - 08/09/2021  1:10 PM      Failed - This refill cannot be delegated      Failed - Urine Drug Screen completed in last 360 days      Passed - Patient is not pregnant      Passed - Valid encounter within last 6 months    Recent Outpatient Visits           2 weeks ago Acquired hypothyroidism   [place], Coralie Keens, NP   3 months ago Screening for colon cancer   [facility] [place], Coralie Keens, NP   6 months ago Benign essential HTN   [facility] [place], Coralie Keens, NP   7 months ago Viral URI with cough   [facility] [place], Coralie Keens, NP   1 year ago Peripheral edema   [facility] [place], Coralie Keens, NP                 Requested Prescriptions  Pending Prescriptions Disp Refills   clonazePAM (KLONOPIN) 1 MG tablet [Pharmacy Med Name: CLONAZEPAM '1MG'$  TABLETS] 75 tablet     Sig: TAKE 1 TABLET BY MOUTH IN THE MORNING, 1/2 TABLET IN THE AFTERNOON, AND 1 TABLET AT BEDTIME     Not Delegated - Psychiatry: Anxiolytics/Hypnotics 2 Failed - 08/09/2021  1:10 PM      Failed - This refill cannot be delegated      Failed - Urine Drug Screen completed in last 360 days      Passed - Patient is not pregnant      Passed - Valid encounter within last 6 months    Recent Outpatient Visits           2 weeks ago Acquired hypothyroidism   [employer], Coralie Keens, NP   3  months ago Screening for colon cancer   [place], NP   6 months ago Benign essential HTN   [facility] [place], Coralie Keens, NP   7 months ago Viral URI with cough   [facility], Coralie Keens, NP   1 year ago Peripheral edema   [facility] [place], Coralie Keens, NP

## 2021-08-11 ENCOUNTER — Other Ambulatory Visit: Payer: Self-pay | Admitting: Internal Medicine

## 2021-08-11 DIAGNOSIS — J42 Unspecified chronic bronchitis: Secondary | ICD-10-CM

## 2021-08-11 NOTE — Telephone Encounter (Signed)
Requested Prescriptions  Pending Prescriptions Disp Refills  . albuterol (VENTOLIN HFA) 108 (90 Base) MCG/ACT inhaler [Pharmacy Med Name: ALBUTEROL HFA INH (200 PUFFS) 8.5GM] 8.5 g 2    Sig: INHALE 2 PUFFS BY MOUTH EVERY 6 HOURS AS NEEDED FOR WHEEZING OR SHORTNESS OF BREATH     Pulmonology:  Beta Agonists 2 Passed - 08/11/2021 10:14 AM      Passed - Last BP in normal range    BP Readings from Last 1 Encounters:  07/25/21 124/68         Passed - Last Heart Rate in normal range    Pulse Readings from Last 1 Encounters:  07/25/21 71         Passed - Valid encounter within last 12 months    Recent Outpatient Visits          2 weeks ago Acquired hypothyroidism   [place], Coralie Keens, NP   3 months ago Screening for colon cancer   [place], NP   6 months ago Benign essential HTN   [facility] [place], Coralie Keens, NP   7 months ago Viral URI with cough   [facility] [place], Coralie Keens, NP   1 year ago Peripheral edema   [facility] [place], Coralie Keens, NP

## 2021-08-31 ENCOUNTER — Other Ambulatory Visit: Payer: Self-pay | Admitting: Internal Medicine

## 2021-08-31 DIAGNOSIS — I10 Essential (primary) hypertension: Secondary | ICD-10-CM

## 2021-09-01 NOTE — Telephone Encounter (Signed)
Requested Prescriptions  Pending Prescriptions Disp Refills  . diltiazem (CARDIZEM CD) 120 MG 24 hr capsule [Pharmacy Med Name: DILTIAZEM CD '120MG'$  CAPSULES (24 HR)] 90 capsule 0    Sig: TAKE 1 CAPSULE BY MOUTH EVERY DAY     Cardiovascular: Calcium Channel Blockers 3 Passed - 08/31/2021  1:51 PM      Passed - ALT in normal range and within 360 days    ALT  Date Value Ref Range Status  07/25/2021 11 6 - 29 U/L Final         Passed - AST in normal range and within 360 days    AST  Date Value Ref Range Status  07/25/2021 16 10 - 35 U/L Final         Passed - Cr in normal range and within 360 days    Creat  Date Value Ref Range Status  07/25/2021 0.91 0.60 - 1.00 mg/dL Final         Passed - Last BP in normal range    BP Readings from Last 1 Encounters:  07/25/21 124/68         Passed - Last Heart Rate in normal range    Pulse Readings from Last 1 Encounters:  07/25/21 71         Passed - Valid encounter within last 6 months    Recent Outpatient Visits          1 month ago Acquired hypothyroidism   [place], Coralie Keens, NP   4 months ago Screening for colon cancer   [place], NP   7 months ago Benign essential HTN   [facility] [place], Coralie Keens, NP   7 months ago Viral URI with cough   [facility] [place], Coralie Keens, NP   1 year ago Peripheral edema   [facility] [place], Coralie Keens, NP

## 2021-09-05 ENCOUNTER — Other Ambulatory Visit: Payer: Self-pay | Admitting: Internal Medicine

## 2021-09-05 DIAGNOSIS — F410 Panic disorder [episodic paroxysmal anxiety] without agoraphobia: Secondary | ICD-10-CM

## 2021-09-06 NOTE — Telephone Encounter (Signed)
Requested medication (s) are due for refill today: yes  Requested medication (s) are on the active medication list: yes  Last refill:  08/10/21 #75/0  Future visit scheduled: no  Notes to clinic:  Unable to refill per protocol, cannot delegate.    Requested Prescriptions  Pending Prescriptions Disp Refills   clonazePAM (KLONOPIN) 1 MG tablet [Pharmacy Med Name: CLONAZEPAM '1MG'$  TABLETS] 75 tablet     Sig: TAKE 1 TABLET BY MOUTH IN THE MORNING, 1/2 TABLET IN THE AFTERNOON, AND 1 TABLET AT BEDTIME     Not Delegated - Psychiatry: Anxiolytics/Hypnotics 2 Failed - 09/05/2021  8:15 AM      Failed - This refill cannot be delegated      Failed - Urine Drug Screen completed in last 360 days      Passed - Patient is not pregnant      Passed - Valid encounter within last 6 months    Recent Outpatient Visits           1 month ago Acquired hypothyroidism   [place], Coralie Keens, NP   4 months ago Screening for colon cancer   [place], NP   7 months ago Benign essential HTN   [facility] [place], Coralie Keens, NP   7 months ago Viral URI with cough   [facility] [place], Coralie Keens, NP   1 year ago Peripheral edema   [facility] [place], Coralie Keens, NP

## 2021-09-11 ENCOUNTER — Other Ambulatory Visit: Payer: Self-pay | Admitting: Internal Medicine

## 2021-09-11 DIAGNOSIS — F410 Panic disorder [episodic paroxysmal anxiety] without agoraphobia: Secondary | ICD-10-CM

## 2021-09-11 DIAGNOSIS — F3341 Major depressive disorder, recurrent, in partial remission: Secondary | ICD-10-CM

## 2021-09-12 NOTE — Telephone Encounter (Signed)
Requested Prescriptions  Pending Prescriptions Disp Refills  . citalopram (CELEXA) 40 MG tablet [Pharmacy Med Name: CITALOPRAM '40MG'$  TABLETS] 90 tablet 0    Sig: TAKE 1 TABLET BY MOUTH EVERY DAY     Psychiatry:  Antidepressants - SSRI Passed - 09/11/2021  6:23 AM      Passed - Completed PHQ-2 or PHQ-9 in the last 360 days      Passed - Valid encounter within last 6 months    Recent Outpatient Visits          1 month ago Acquired hypothyroidism   [facility] [place], Coralie Keens, NP   4 months ago Screening for colon cancer   [facility] [place], Coralie Keens, NP   7 months ago Benign essential HTN   [facility] [place], Coralie Keens, NP   8 months ago Viral URI with cough   [facility] [place], Coralie Keens, NP   1 year ago Peripheral edema   [facility] [place], Coralie Keens, NP             . levothyroxine (SYNTHROID) 200 MCG tablet [Pharmacy Med Name: LEVOTHYROXINE 0.'2MG'$  (200MCG) TAB] 90 tablet 1    Sig: TAKE 1 TABLET(200 MCG) BY MOUTH DAILY     Endocrinology:  Hypothyroid Agents Passed - 09/11/2021  6:23 AM      Passed - TSH in normal range and within 360 days    TSH  Date Value Ref Range Status  07/25/2021 3.80 0.40 - 4.50 mIU/L Final         Passed - Valid encounter within last 12 months    Recent Outpatient Visits          1 month ago Acquired hypothyroidism   [facility] [place], Coralie Keens, NP   4 months ago Screening for colon cancer   [place], NP   7 months ago Benign essential HTN   [facility] [place], Coralie Keens, NP   8 months ago Viral URI with cough   [facility] [place], Coralie Keens, NP   1 year ago Peripheral edema   [facility] [place], Coralie Keens, NP

## 2021-10-04 ENCOUNTER — Other Ambulatory Visit: Payer: Self-pay | Admitting: Internal Medicine

## 2021-10-04 DIAGNOSIS — F410 Panic disorder [episodic paroxysmal anxiety] without agoraphobia: Secondary | ICD-10-CM

## 2021-10-04 NOTE — Telephone Encounter (Signed)
Requested medication (s) are due for refill today: no  Requested medication (s) are on the active medication list: yes  Last refill:  09/07/21 #75   Future visit scheduled: no  Notes to clinic:  med not delegated to NT to RF   Requested Prescriptions  Pending Prescriptions Disp Refills   clonazePAM (KLONOPIN) 1 MG tablet [Pharmacy Med Name: CLONAZEPAM '1MG'$  TABLETS] 75 tablet     Sig: TAKE 1 TABLET BY MOUTH EVERY MORNING, 1/2 TABLET EVERY AFTERNOON, AND 1 TABLET EVERY NIGHT AT BEDTIME.     Not Delegated - Psychiatry: Anxiolytics/Hypnotics 2 Failed - 10/04/2021 10:08 AM      Failed - This refill cannot be delegated      Failed - Urine Drug Screen completed in last 360 days      Passed - Patient is not pregnant      Passed - Valid encounter within last 6 months    Recent Outpatient Visits           2 months ago Acquired hypothyroidism   [place], Coralie Keens, NP   5 months ago Screening for colon cancer   [place], NP   8 months ago Benign essential HTN   [facility] [place], Coralie Keens, NP   8 months ago Viral URI with cough   [facility] [place], Coralie Keens, NP   1 year ago Peripheral edema   [facility] [place], Coralie Keens, NP

## 2021-10-11 ENCOUNTER — Other Ambulatory Visit: Payer: Self-pay | Admitting: Internal Medicine

## 2021-10-11 DIAGNOSIS — I10 Essential (primary) hypertension: Secondary | ICD-10-CM

## 2021-10-12 NOTE — Telephone Encounter (Signed)
Requested Prescriptions  Pending Prescriptions Disp Refills  . lisinopril (ZESTRIL) 10 MG tablet [Pharmacy Med Name: LISINOPRIL '10MG'$  TABLETS] 90 tablet 0    Sig: TAKE 1 TABLET(10 MG) BY MOUTH DAILY     Cardiovascular:  ACE Inhibitors Passed - 10/11/2021  6:21 AM      Passed - Cr in normal range and within 180 days    Creat  Date Value Ref Range Status  07/25/2021 0.91 0.60 - 1.00 mg/dL Final         Passed - K in normal range and within 180 days    Potassium  Date Value Ref Range Status  07/25/2021 4.4 3.5 - 5.3 mmol/L Final  02/02/2013 3.7 3.5 - 5.1 mmol/L Final         Passed - Patient is not pregnant      Passed - Last BP in normal range    BP Readings from Last 1 Encounters:  07/25/21 124/68         Passed - Valid encounter within last 6 months    Recent Outpatient Visits          2 months ago Acquired hypothyroidism   [place], Coralie Keens, NP   5 months ago Screening for colon cancer   [place], NP   8 months ago Benign essential HTN   [facility] [place], Coralie Keens, NP   9 months ago Viral URI with cough   [facility] [place], Coralie Keens, NP   1 year ago Peripheral edema   [facility] [place], Coralie Keens, NP

## 2021-10-18 ENCOUNTER — Other Ambulatory Visit: Payer: Self-pay | Admitting: Internal Medicine

## 2021-10-18 DIAGNOSIS — I10 Essential (primary) hypertension: Secondary | ICD-10-CM

## 2021-10-19 NOTE — Telephone Encounter (Signed)
Requested Prescriptions  Pending Prescriptions Disp Refills  . carvedilol (COREG) 3.125 MG tablet [Pharmacy Med Name: CARVEDILOL 3.'125MG'$  TABLETS] 180 tablet 0    Sig: TAKE 1 TABLET BY MOUTH TWICE DAILY WITH A MEAL     Cardiovascular: Beta Blockers 3 Passed - 10/18/2021  9:04 AM      Passed - Cr in normal range and within 360 days    Creat  Date Value Ref Range Status  07/25/2021 0.91 0.60 - 1.00 mg/dL Final         Passed - AST in normal range and within 360 days    AST  Date Value Ref Range Status  07/25/2021 16 10 - 35 U/L Final         Passed - ALT in normal range and within 360 days    ALT  Date Value Ref Range Status  07/25/2021 11 6 - 29 U/L Final         Passed - Last BP in normal range    BP Readings from Last 1 Encounters:  07/25/21 124/68         Passed - Last Heart Rate in normal range    Pulse Readings from Last 1 Encounters:  07/25/21 71         Passed - Valid encounter within last 6 months    Recent Outpatient Visits          2 months ago Acquired hypothyroidism   [place], Coralie Keens, NP   6 months ago Screening for colon cancer   [place], NP   8 months ago Benign essential HTN   [facility] [place], Coralie Keens, NP   9 months ago Viral URI with cough   [facility] [place], Coralie Keens, NP   1 year ago Peripheral edema   [facility] [place], Coralie Keens, NP

## 2021-11-01 ENCOUNTER — Other Ambulatory Visit: Payer: Self-pay | Admitting: Internal Medicine

## 2021-11-01 DIAGNOSIS — F410 Panic disorder [episodic paroxysmal anxiety] without agoraphobia: Secondary | ICD-10-CM

## 2021-11-01 NOTE — Telephone Encounter (Signed)
Requested medication (s) are due for refill today: yes  Requested medication (s) are on the active medication list: yes  Last refill:  10/04/21 #75/0  Future visit scheduled: no  Notes to clinic:  Unable to refill per protocol, cannot delegate.    Requested Prescriptions  Pending Prescriptions Disp Refills   clonazePAM (KLONOPIN) 1 MG tablet [Pharmacy Med Name: CLONAZEPAM '1MG'$  TABLETS] 75 tablet     Sig: TAKE 1 TABLET BY MOUTH EVERY MORNING, 1/2 TABLET EVERY AFTERNOON, AND 1 TABLET EVERY NIGHT AT BEDTIME.     Not Delegated - Psychiatry: Anxiolytics/Hypnotics 2 Failed - 11/01/2021  8:29 AM      Failed - This refill cannot be delegated      Failed - Urine Drug Screen completed in last 360 days      Passed - Patient is not pregnant      Passed - Valid encounter within last 6 months    Recent Outpatient Visits           3 months ago Acquired hypothyroidism   [place], Coralie Keens, NP   6 months ago Screening for colon cancer   [place], NP   9 months ago Benign essential HTN   [facility] [place], Coralie Keens, NP   9 months ago Viral URI with cough   [facility] [place], Coralie Keens, NP   1 year ago Peripheral edema   [facility] [place], Coralie Keens, NP

## 2021-12-03 ENCOUNTER — Other Ambulatory Visit: Payer: Self-pay | Admitting: Internal Medicine

## 2021-12-03 DIAGNOSIS — F410 Panic disorder [episodic paroxysmal anxiety] without agoraphobia: Secondary | ICD-10-CM

## 2021-12-04 ENCOUNTER — Other Ambulatory Visit: Payer: Self-pay | Admitting: Internal Medicine

## 2021-12-04 DIAGNOSIS — F410 Panic disorder [episodic paroxysmal anxiety] without agoraphobia: Secondary | ICD-10-CM

## 2021-12-04 NOTE — Telephone Encounter (Signed)
Medication Refill - Medication: clonazePAM (KLONOPIN) 1 MG tablet [[id number]]   Has the patient contacted their pharmacy? Yes.   (Agent: If no, request that the patient contact the pharmacy for the refill. If patient does not wish to contact the pharmacy document the reason why and proceed with request.) (Agent: If yes, when and what did the pharmacy advise?)  Preferred Pharmacy (with phone number or street name):  [id number] [address], [place]  [place] [address]  Phone: [phone] Fax: [phone]  Hours: Not open 24 hours   Has the patient been seen for an appointment in the last year OR does the patient have an upcoming appointment? Yes.    Agent: Please be advised that RX refills may take up to 3 business days. We ask that you follow-up with your pharmacy.

## 2021-12-04 NOTE — Telephone Encounter (Signed)
Requested medication (s) are due for refill today: yes  Requested medication (s) are on the active medication list: yes  Last refill:  11/01/21  Future visit scheduled: yes  Notes to clinic:  Unable to refill per protocol, cannot delegate.      Requested Prescriptions  Pending Prescriptions Disp Refills   clonazePAM (KLONOPIN) 1 MG tablet [Pharmacy Med Name: CLONAZEPAM '1MG'$  TABLETS] 75 tablet     Sig: TAKE 1 TABLET BY MOUTH EVERY MORNING, 1/2 TABLET EVERY AFTERNOON AND 1 TABLET EVERY NIGHT AT BEDTIME     Not Delegated - Psychiatry: Anxiolytics/Hypnotics 2 Failed - 12/03/2021  3:59 PM      Failed - This refill cannot be delegated      Failed - Urine Drug Screen completed in last 360 days      Passed - Patient is not pregnant      Passed - Valid encounter within last 6 months    Recent Outpatient Visits           4 months ago Acquired hypothyroidism   [place], Coralie Keens, NP   7 months ago Screening for colon cancer   [facility] [place], Coralie Keens, NP   10 months ago Benign essential HTN   [facility] [place], Coralie Keens, NP   10 months ago Viral URI with cough   [facility] [place], Coralie Keens, NP   1 year ago Peripheral edema   [facility] [place], Coralie Keens, NP

## 2021-12-05 MED ORDER — CLONAZEPAM 1 MG PO TABS: ORAL_TABLET | ORAL | 0 refills | Status: DC

## 2021-12-05 NOTE — Telephone Encounter (Signed)
Requested medication (s) are due for refill today: yes  Requested medication (s) are on the active medication list: yes    Last refill: 11/01/21  #75  0 refills  Future visit scheduled no  Notes to clinic:  Not delegated, please review. Thank you.  Requested Prescriptions  Pending Prescriptions Disp Refills   clonazePAM (KLONOPIN) 1 MG tablet 75 tablet 0     Not Delegated - Psychiatry: Anxiolytics/Hypnotics 2 Failed - 12/04/2021 10:19 AM      Failed - This refill cannot be delegated      Failed - Urine Drug Screen completed in last 360 days      Passed - Patient is not pregnant      Passed - Valid encounter within last 6 months    Recent Outpatient Visits           4 months ago Acquired hypothyroidism   [place], Coralie Keens, NP   7 months ago Screening for colon cancer   [place], NP   10 months ago Benign essential HTN   [facility] [place], Coralie Keens, NP   10 months ago Viral URI with cough   [facility] [place], Coralie Keens, NP   1 year ago Peripheral edema   [facility] [place], Coralie Keens, NP

## 2021-12-10 ENCOUNTER — Other Ambulatory Visit: Payer: Self-pay | Admitting: Internal Medicine

## 2021-12-10 DIAGNOSIS — F410 Panic disorder [episodic paroxysmal anxiety] without agoraphobia: Secondary | ICD-10-CM

## 2021-12-10 DIAGNOSIS — F3341 Major depressive disorder, recurrent, in partial remission: Secondary | ICD-10-CM

## 2021-12-11 NOTE — Telephone Encounter (Signed)
Requested Prescriptions  Pending Prescriptions Disp Refills   citalopram (CELEXA) 40 MG tablet [Pharmacy Med Name: CITALOPRAM '40MG'$  TABLETS] 90 tablet 0    Sig: TAKE 1 TABLET BY MOUTH EVERY DAY     Psychiatry:  Antidepressants - SSRI Passed - 12/10/2021  6:26 AM      Passed - Completed PHQ-2 or PHQ-9 in the last 360 days      Passed - Valid encounter within last 6 months    Recent Outpatient Visits           4 months ago Acquired hypothyroidism   [place], Coralie Keens, NP   7 months ago Screening for colon cancer   [place], NP   10 months ago Benign essential HTN   [facility] [place], Coralie Keens, NP   11 months ago Viral URI with cough   [facility] [place], Coralie Keens, NP   1 year ago Peripheral edema   [facility] [place], Coralie Keens, NP

## 2022-01-02 ENCOUNTER — Other Ambulatory Visit: Payer: Self-pay | Admitting: Internal Medicine

## 2022-01-02 DIAGNOSIS — F410 Panic disorder [episodic paroxysmal anxiety] without agoraphobia: Secondary | ICD-10-CM

## 2022-01-02 NOTE — Telephone Encounter (Signed)
Requested medication (s) are due for refill today: yes  Requested medication (s) are on the active medication list: yes  Last refill:  12/05/21  Future visit scheduled: yes  Notes to clinic:  Unable to refill per protocol, cannot delegate.      Requested Prescriptions  Pending Prescriptions Disp Refills   clonazePAM (KLONOPIN) 1 MG tablet [Pharmacy Med Name: CLONAZEPAM '1MG'$  TABLETS] 75 tablet     Sig: TAKE 1 TABLET BY MOUTH EVERY MORNING, 1/2 TABLET EVERY AFTERNOON AND 1 TABLET EVERY NIGHT AT BEDTIME     Not Delegated - Psychiatry: Anxiolytics/Hypnotics 2 Failed - 01/02/2022  8:05 AM      Failed - This refill cannot be delegated      Failed - Urine Drug Screen completed in last 360 days      Passed - Patient is not pregnant      Passed - Valid encounter within last 6 months    Recent Outpatient Visits           5 months ago Acquired hypothyroidism   [place], Coralie Keens, NP   8 months ago Screening for colon cancer   [facility] [place], Coralie Keens, NP   11 months ago Benign essential HTN   [facility] [place], Coralie Keens, NP   11 months ago Viral URI with cough   [facility] [place], [facility], NP   1 year ago Peripheral edema   [facility] [place], Coralie Keens, NP

## 2022-01-09 ENCOUNTER — Other Ambulatory Visit: Payer: Self-pay | Admitting: Internal Medicine

## 2022-01-09 DIAGNOSIS — I10 Essential (primary) hypertension: Secondary | ICD-10-CM

## 2022-01-09 NOTE — Telephone Encounter (Signed)
Requested Prescriptions  Pending Prescriptions Disp Refills   lisinopril (ZESTRIL) 10 MG tablet [Pharmacy Med Name: LISINOPRIL '10MG'$  TABLETS] 90 tablet 0    Sig: TAKE 1 TABLET(10 MG) BY MOUTH DAILY     Cardiovascular:  ACE Inhibitors Passed - 01/09/2022  6:24 AM      Passed - Cr in normal range and within 180 days    Creat  Date Value Ref Range Status  07/25/2021 0.91 0.60 - 1.00 mg/dL Final         Passed - K in normal range and within 180 days    Potassium  Date Value Ref Range Status  07/25/2021 4.4 3.5 - 5.3 mmol/L Final  02/02/2013 3.7 3.5 - 5.1 mmol/L Final         Passed - Patient is not pregnant      Passed - Last BP in normal range    BP Readings from Last 1 Encounters:  07/25/21 124/68         Passed - Valid encounter within last 6 months    Recent Outpatient Visits           5 months ago Acquired hypothyroidism   [place], Coralie Keens, NP   8 months ago Screening for colon cancer   [facility] [place], Coralie Keens, NP   11 months ago Benign essential HTN   [facility] [place], Coralie Keens, NP   12 months ago Viral URI with cough   [facility] [place], [place], NP   1 year ago Peripheral edema   [facility] [place], Coralie Keens, NP               carvedilol (COREG) 3.125 MG tablet [Pharmacy Med Name: CARVEDILOL 3.'125MG'$  TABLETS] 180 tablet 0    Sig: TAKE 1 TABLET BY MOUTH TWICE DAILY WITH A MEAL     Cardiovascular: Beta Blockers 3 Passed - 01/09/2022  6:24 AM      Passed - Cr in normal range and within 360 days    Creat  Date Value Ref Range Status  07/25/2021 0.91 0.60 - 1.00 mg/dL Final         Passed - AST in normal range and within 360 days    AST  Date Value Ref Range Status  07/25/2021 16 10 - 35 U/L Final         Passed - ALT in normal range and within 360 days    ALT  Date Value Ref Range Status  07/25/2021 11 6 - 29 U/L Final         Passed - Last BP in normal  range    BP Readings from Last 1 Encounters:  07/25/21 124/68         Passed - Last Heart Rate in normal range    Pulse Readings from Last 1 Encounters:  07/25/21 71         Passed - Valid encounter within last 6 months    Recent Outpatient Visits           5 months ago Acquired hypothyroidism   [place], Coralie Keens, NP   8 months ago Screening for colon cancer   [facility] [place], Coralie Keens, NP   11 months ago Benign essential HTN   [facility] [place], Coralie Keens, NP   12 months ago Viral URI with cough   [place],  Coralie Keens, NP   1 year ago Peripheral edema   [facility] [place], Coralie Keens, [place]

## 2022-01-30 ENCOUNTER — Other Ambulatory Visit: Payer: Self-pay | Admitting: Internal Medicine

## 2022-01-30 DIAGNOSIS — F410 Panic disorder [episodic paroxysmal anxiety] without agoraphobia: Secondary | ICD-10-CM

## 2022-02-01 NOTE — Telephone Encounter (Signed)
Requested Prescriptions  Pending Prescriptions Disp Refills   ASPIRIN LOW DOSE 81 MG tablet [Pharmacy Med Name: ASPIRIN 81MG EC LOW DOSE TABLETS] 90 tablet 1    Sig: TAKE 1 TABLET(81 MG) BY MOUTH DAILY. SWALLOW WHOLE     Analgesics:  NSAIDS - aspirin Passed - 01/30/2022  8:10 AM      Passed - Cr in normal range and within 360 days    Creat  Date Value Ref Range Status  07/25/2021 0.91 0.60 - 1.00 mg/dL Final         Passed - eGFR is 10 or above and within 360 days    GFR, Est African American  Date Value Ref Range Status  12/03/2019 84 > OR = 60 mL/min/1.67m Final   GFR, Est Non African American  Date Value Ref Range Status  12/03/2019 72 > OR = 60 mL/min/1.728mFinal   GFR, Estimated  Date Value Ref Range Status  12/09/2020 >60 >60 mL/min Final    Comment:    (NOTE) Calculated using the CKD-EPI Creatinine Equation (2021) Performed at [facility] [place] [id number]  eGFR  Date Value Ref Range Status  07/25/2021 67 > OR = 60 mL/min/1.7358minal    Comment:    The eGFR is based on the CKD-EPI 2021 equation. To calculate  the new eGFR from a previous Creatinine or Cystatin C result, go to https://www.kidney.org/professionals/ kdoqi/gfr%5Fcalculator          Passed - Patient is not pregnant      Passed - Valid encounter within last 12 months    Recent Outpatient Visits           6 months ago Acquired hypothyroidism   [facility]   9 months ago Screening for colon cancer   [facility]   1 year ago Benign essential HTN   [facility]   1 year ago Viral URI with cough   [facility]   1 year ago Peripheral edema   [facility]               rosuvastatin (CRESTOR) 10 MG tablet [Pharmacy Med Name: ROSUVASTATIN 10MG TABLETS] 90 tablet 1    Sig:  TAKE 1 TABLET(10 MG) BY MOUTH DAILY     Cardiovascular:  Antilipid - Statins 2 Failed - 01/30/2022  8:10 AM      Failed - Lipid Panel in normal range within the last 12 months    Cholesterol, Total  Date Value Ref Range Status  02/03/2015 205 (H) 100 - 199 mg/dL Final   Cholesterol  Date Value Ref Range Status  07/25/2021 115 <200 mg/dL Final   LDL Cholesterol (Calc)  Date Value Ref Range Status  07/25/2021 47 mg/dL (calc) Final    Comment:    Reference range: <100 . Desirable range <100 mg/dL for primary prevention;   <70 mg/dL for patients with CHD or diabetic patients  with > or = 2 CHD risk factors. . LMarland KitchenL-C is now calculated using the Martin-Hopkins  calculation, which is a validated novel method providing  better accuracy than the Friedewald equation in the  estimation of LDL-C.  MarCresenciano Genre al. JAMAnnamaria [id number];470([id number]  (http://education.[employer].com/faq/FAQ164)    HDL  Date Value Ref Range Status  07/25/2021 43 (L) > OR = 50  mg/dL Final  02/03/2015 36 (L) >39 mg/dL Final   Triglycerides  Date Value Ref Range Status  07/25/2021 174 (H) <150 mg/dL Final         Passed - Cr in normal range and within 360 days    Creat  Date Value Ref Range Status  07/25/2021 0.91 0.60 - 1.00 mg/dL Final         Passed - Patient is not pregnant      Passed - Valid encounter within last 12 months    Recent Outpatient Visits           6 months ago Acquired hypothyroidism   [place], NP   9 months ago Screening for colon cancer   [facility] [place], Coralie Keens, NP   1 year ago Benign essential HTN   [facility] [place], Coralie Keens, NP   1 year ago Viral URI with cough   [facility] [place], Coralie Keens, NP   1 year ago Peripheral edema   [place], NP               clonazePAM (KLONOPIN) 1 MG tablet [Pharmacy Med Name: CLONAZEPAM 1MG TABLETS] 75  tablet     Sig: TAKE 1 TABLET BY MOUTH EVERY MORNING, 1/2 TABLET EVERY AFTERNOON, AND 1 TABLET EVERY NIGHT AT BEDTIME.     Not Delegated - Psychiatry: Anxiolytics/Hypnotics 2 Failed - 01/30/2022  8:10 AM      Failed - This refill cannot be delegated      Failed - Urine Drug Screen completed in last 360 days      Failed - Valid encounter within last 6 months    Recent Outpatient Visits           6 months ago Acquired hypothyroidism   [place], Coralie Keens, NP   9 months ago Screening for colon cancer   [facility] [place], Coralie Keens, NP   1 year ago Benign essential HTN   [facility] [place], Coralie Keens, NP   1 year ago Viral URI with cough   [facility] [place], Coralie Keens, NP   1 year ago Peripheral edema   [place], [place]              Passed - Patient is not pregnant

## 2022-02-01 NOTE — Telephone Encounter (Signed)
Requested medication (s) are due for refill today: Yes  Requested medication (s) are on the active medication list: Yes  Last refill:  01/04/22  Future visit scheduled:Yes  Notes to clinic:  Unable to refill per protocol, cannot delegate.      Requested Prescriptions  Pending Prescriptions Disp Refills   clonazePAM (KLONOPIN) 1 MG tablet [Pharmacy Med Name: CLONAZEPAM 1MG TABLETS] 75 tablet     Sig: TAKE 1 TABLET BY MOUTH EVERY MORNING, 1/2 TABLET EVERY AFTERNOON, AND 1 TABLET EVERY NIGHT AT BEDTIME.     Not Delegated - Psychiatry: Anxiolytics/Hypnotics 2 Failed - 01/30/2022  8:10 AM      Failed - This refill cannot be delegated      Failed - Urine Drug Screen completed in last 360 days      Failed - Valid encounter within last 6 months    Recent Outpatient Visits           6 months ago Acquired hypothyroidism   [facility] [place], Coralie Keens, NP   9 months ago Screening for colon cancer   [facility] [place], Coralie Keens, NP   1 year ago Benign essential HTN   [facility] [place], Coralie Keens, NP   1 year ago Viral URI with cough   [facility] [place], Coralie Keens, NP   1 year ago Peripheral edema   [place], [place]              Passed - Patient is not pregnant      Signed Prescriptions Disp Refills   ASPIRIN LOW DOSE 81 MG tablet 90 tablet 1    Sig: TAKE 1 TABLET(81 MG) BY MOUTH DAILY. SWALLOW WHOLE     Analgesics:  NSAIDS - aspirin Passed - 01/30/2022  8:10 AM      Passed - Cr in normal range and within 360 days    Creat  Date Value Ref Range Status  07/25/2021 0.91 0.60 - 1.00 mg/dL Final         Passed - eGFR is 10 or above and within 360 days    GFR, Est African American  Date Value Ref Range Status  12/03/2019 84 > OR = 60 mL/min/1.18m Final   GFR, Est Non African American  Date Value Ref Range Status  12/03/2019 72 > OR = 60 mL/min/1.744mFinal   GFR, Estimated   Date Value Ref Range Status  12/09/2020 >60 >60 mL/min Final    Comment:    (NOTE) Calculated using the CKD-EPI Creatinine Equation (2021) Performed at [facility] [place] [id number]  eGFR  Date Value Ref Range Status  07/25/2021 67 > OR = 60 mL/min/1.7368minal    Comment:    The eGFR is based on the CKD-EPI 2021 equation. To calculate  the new eGFR from a previous Creatinine or Cystatin C result, go to https://www.kidney.org/professionals/ kdoqi/gfr%5Fcalculator          Passed - Patient is not pregnant      Passed - Valid encounter within last 12 months    Recent Outpatient Visits           6 months ago Acquired hyp[facility]   [facility]   9 months ago [facility] for colon cancer   [facility]   1 year ago Benign essential HTN   [place]  [facility] [place], Coralie Keens, NP   1 year ago Viral URI with cough   [facility] [place], [place], NP   1 year ago Peripheral edema   [facility] [place]               rosuvastatin (CRESTOR) 10 MG tablet 90 tablet 1    Sig: TAKE 1 TABLET(10 MG) BY MOUTH DAILY     Cardiovascular:  Antilipid - Statins 2 Failed - 01/30/2022  8:10 AM      Failed - Lipid Panel in normal range within the last 12 months    Cholesterol, Total  Date Value Ref Range Status  02/03/2015 205 (H) 100 - 199 mg/dL Final   Cholesterol  Date Value Ref Range Status  07/25/2021 115 <200 mg/dL Final   LDL Cholesterol (Calc)  Date Value Ref Range Status  07/25/2021 47 mg/dL (calc) Final    Comment:    Reference range: <100 . Desirable range <100 mg/dL for primary prevention;   <70 mg/dL for patients with CHD or diabetic patients  with > or = 2 CHD risk factors. Marland Kitchen LDL-C is now calculated using the Martin-Hopkins  calculation, which is a validated novel method providing  better accuracy than  the Friedewald equation in the  estimation of LDL-C.  Cresenciano Genre et al. Annamaria Helling. 7341;937(90): 2061-2068  (http://education.Quest[employer].com/faq/FAQ164)    HDL  Date Value Ref Range Status  07/25/2021 43 (L) > OR = 50 mg/dL Final  02/03/2015 36 (L) >39 mg/dL Final   Triglycerides  Date Value Ref Range Status  07/25/2021 174 (H) <150 mg/dL Final         Passed - Cr in normal range and within 360 days    Creat  Date Value Ref Range Status  07/25/2021 0.91 0.60 - 1.00 mg/dL Final         Passed - Patient is not pregnant      Passed - Valid encounter within last 12 months    Recent Outpatient Visits           6 months ago Acquired hypothyroidism   [place], Coralie Keens, NP   9 months ago Screening for colon cancer   [facility] [place], Coralie Keens, NP   1 year ago Benign essential HTN   [facility] [place], Coralie Keens, NP   1 year ago Viral URI with cough   [facility] [place], Coralie Keens, NP   1 year ago Peripheral edema   [facility] [place], Coralie Keens, NP

## 2022-03-01 ENCOUNTER — Other Ambulatory Visit: Payer: Self-pay | Admitting: Internal Medicine

## 2022-03-01 DIAGNOSIS — F410 Panic disorder [episodic paroxysmal anxiety] without agoraphobia: Secondary | ICD-10-CM

## 2022-03-01 NOTE — Telephone Encounter (Signed)
Requested medication (s) are due for refill today: yes  Requested medication (s) are on the active medication list: yes  Last refill:  02/02/22 #75/0  Future visit scheduled: no  Notes to clinic:  Unable to refill per protocol, cannot delegate.    Requested Prescriptions  Pending Prescriptions Disp Refills   clonazePAM (KLONOPIN) 1 MG tablet [Pharmacy Med Name: CLONAZEPAM '1MG'$  TABLETS] 75 tablet     Sig: TAKE 1 TABLET BY MOUTH EVERY MORNING, 1/2 TABLET EVERY AFTERNOON, AND 1 TABLET EVERY NIGHT AT BEDTIME.     Not Delegated - Psychiatry: Anxiolytics/Hypnotics 2 Failed - 03/01/2022  9:08 AM      Failed - This refill cannot be delegated      Failed - Urine Drug Screen completed in last 360 days      Failed - Valid encounter within last 6 months    Recent Outpatient Visits           7 months ago Acquired hypothyroidism   [facility] [place], Coralie Keens, NP   10 months ago Screening for colon cancer   [facility] [place], Coralie Keens, NP   1 year ago Benign essential HTN   [facility] [place], Coralie Keens, NP   1 year ago Viral URI with cough   [facility] [place], Coralie Keens, NP   1 year ago Peripheral edema   [place], [place]              Passed - Patient is not pregnant

## 2022-03-05 ENCOUNTER — Ambulatory Visit (INDEPENDENT_AMBULATORY_CARE_PROVIDER_SITE_OTHER): Payer: PPO | Admitting: Internal Medicine

## 2022-03-05 ENCOUNTER — Encounter: Payer: Self-pay | Admitting: Internal Medicine

## 2022-03-05 VITALS — BP 134/68 | HR 77 | Temp 96.8°F | Wt 201.0 lb

## 2022-03-05 DIAGNOSIS — I70219 Atherosclerosis of native arteries of extremities with intermittent claudication, unspecified extremity: Secondary | ICD-10-CM

## 2022-03-05 DIAGNOSIS — J432 Centrilobular emphysema: Secondary | ICD-10-CM

## 2022-03-05 DIAGNOSIS — F32A Depression, unspecified: Secondary | ICD-10-CM

## 2022-03-05 DIAGNOSIS — E1165 Type 2 diabetes mellitus with hyperglycemia: Secondary | ICD-10-CM

## 2022-03-05 DIAGNOSIS — E66812 Obesity, class 2: Secondary | ICD-10-CM

## 2022-03-05 DIAGNOSIS — M159 Polyosteoarthritis, unspecified: Secondary | ICD-10-CM

## 2022-03-05 DIAGNOSIS — E782 Mixed hyperlipidemia: Secondary | ICD-10-CM | POA: Diagnosis not present

## 2022-03-05 DIAGNOSIS — Z6835 Body mass index (BMI) 35.0-35.9, adult: Secondary | ICD-10-CM

## 2022-03-05 DIAGNOSIS — I10 Essential (primary) hypertension: Secondary | ICD-10-CM

## 2022-03-05 DIAGNOSIS — F99 Mental disorder, not otherwise specified: Secondary | ICD-10-CM

## 2022-03-05 DIAGNOSIS — F410 Panic disorder [episodic paroxysmal anxiety] without agoraphobia: Secondary | ICD-10-CM

## 2022-03-05 DIAGNOSIS — K219 Gastro-esophageal reflux disease without esophagitis: Secondary | ICD-10-CM

## 2022-03-05 DIAGNOSIS — L409 Psoriasis, unspecified: Secondary | ICD-10-CM

## 2022-03-05 DIAGNOSIS — M150 Primary generalized (osteo)arthritis: Secondary | ICD-10-CM

## 2022-03-05 DIAGNOSIS — I700 Atherosclerosis of aorta: Secondary | ICD-10-CM

## 2022-03-05 DIAGNOSIS — F5105 Insomnia due to other mental disorder: Secondary | ICD-10-CM

## 2022-03-05 DIAGNOSIS — I6523 Occlusion and stenosis of bilateral carotid arteries: Secondary | ICD-10-CM

## 2022-03-05 DIAGNOSIS — E039 Hypothyroidism, unspecified: Secondary | ICD-10-CM | POA: Diagnosis not present

## 2022-03-05 DIAGNOSIS — F419 Anxiety disorder, unspecified: Secondary | ICD-10-CM

## 2022-03-05 LAB — POCT GLYCOSYLATED HEMOGLOBIN (HGB A1C): HbA1c, POC (controlled diabetic range): 6.3 % (ref 0.0–7.0)

## 2022-03-05 MED ORDER — CITALOPRAM HYDROBROMIDE 40 MG PO TABS: 40.0000 mg | ORAL_TABLET | Freq: Every day | ORAL | 1 refills | Status: DC

## 2022-03-05 MED ORDER — CLONAZEPAM 1 MG PO TABS: ORAL_TABLET | ORAL | 0 refills | Status: DC

## 2022-03-05 NOTE — Assessment & Plan Note (Signed)
Continue Breo and albuterol

## 2022-03-05 NOTE — Assessment & Plan Note (Signed)
POCT A1c 6.3% Encourage low-carb diet and exercise for weight loss Encouraged routine eye exam Encouraged routine foot exam She declines flu shot Pneumonia vaccines UTD She declines COVID

## 2022-03-05 NOTE — Assessment & Plan Note (Signed)
Encourage weight loss as this can help to reduce reflux symptoms Okay to take Tums OTC as needed

## 2022-03-05 NOTE — Assessment & Plan Note (Signed)
C-Met and lipid profile today  Encouraged her to consume a low-fat diet Continue rosuvastatin 

## 2022-03-05 NOTE — Patient Instructions (Signed)

## 2022-03-05 NOTE — Assessment & Plan Note (Signed)
Continue citalopram, buspirone, hydroxyzine and clonazepam

## 2022-03-05 NOTE — Assessment & Plan Note (Signed)
Continue Tremfya and clobetasol She will continue to follow with dermatology

## 2022-03-05 NOTE — Assessment & Plan Note (Signed)
TSH and free T4 today We will adjust levothyroxine if needed based on labs 

## 2022-03-05 NOTE — Assessment & Plan Note (Signed)
C-Met and lipid profile today Encouraged her to consume a low-fat diet Continue rosuvastatin and aspirin

## 2022-03-05 NOTE — Addendum Note (Signed)
Addended by: Jearld Fenton on: 03/05/2022 12:34 PM   Modules accepted: Orders

## 2022-03-05 NOTE — Assessment & Plan Note (Signed)
Continue clonazepam at bedtime

## 2022-03-05 NOTE — Assessment & Plan Note (Signed)
Encourage weight loss as this can help reduce joint pain Continue Tylenol OTC as needed

## 2022-03-05 NOTE — Assessment & Plan Note (Signed)
Continue carvedilol, diltiazem and lisinopril Reinforced DASH diet and exercise for weight loss C-Met today

## 2022-03-05 NOTE — Assessment & Plan Note (Signed)
Encourage diet and exercise for weight loss 

## 2022-03-05 NOTE — Progress Notes (Signed)
Subjective:    Patient ID: [name], female    DOB: [birth year], 72 y.o.   MRN: [id number]  HPI  Patient presents to clinic today for follow-up of chronic conditions.  HTN: Her BP today is 134/68.  She is taking Carvedilol, Diltiazem and Lisinopril as prescribed.  ECG from 11/2020 reviewed.  HLD with Carotid/Thoracic Atherosclerosis, PAD with claudication: Her last LDL was 47, triglycerides 174, 07/2021.  She denies myalgias on Rosuvastatin.  She is taking Aspirin as prescribed.  She does not consume low-fat diet.  She follows with vascular.  COPD: She reports chronic cough and shortness of breath.  She is taking Breo and Albuterol as prescribed.  There are no PFTs on file.  She no longer smokes.  She does not follow with pulmonology.  OA: Mainly in her back and hips.  She takes Tylenol OTC with some relief of symptoms.  She does not follow with orthopedics.  Depression, Anxiety and Panic Attacks: Chronic, managed on Citalopram, Buspirone, Hydroxyzine and Clonazepam.  She is not currently seeing a therapist or psychiatrist.  She denies SI/HI.  GERD: Currently not an issue.  She is not taking any medications for this.  There is no upper GI on file.  Insomnia: She has difficulty staying asleep.  She takes Clonazepam at bedtime with good relief of symptoms.  There is no sleep study on file.  DM2: Her last A1c was 6.6%, 07/2021.  She is not taking any oral diabetic medication at this time. She has not seen an eye doctor in the last year. She does not check her sugars. Flu 11/2019. Pneumovax 04/2019. Prevnar 05/2017. Covid never.  Hypothyroidism: She denies any issues on her current dose of Levothyroxine.  She does not follow with endocrinology.  Psoriasis: Managed with Tremfya and Clobetasol.  She follows with dermatology.  Review of Systems     Past Medical History:  Diagnosis Date   Anxiety    Arthritis    Chronic back pain    COPD (chronic obstructive pulmonary disease) (HCC)     Depression    GERD (gastroesophageal reflux disease)    Hypertension    Hypothyroidism    Pneumonia    RECENT X 3   PONV (postoperative nausea and vomiting)    Psoriasis    Thyroid disease     Current Outpatient Medications  Medication Sig Dispense Refill   acetaminophen (TYLENOL) 500 MG tablet Take 500 mg by mouth every 6 (six) hours as needed for moderate pain.      albuterol (VENTOLIN HFA) 108 (90 Base) MCG/ACT inhaler INHALE 2 PUFFS BY MOUTH EVERY 6 HOURS AS NEEDED FOR WHEEZING OR SHORTNESS OF BREATH 8.5 g 2   ASPIRIN LOW DOSE 81 MG tablet TAKE 1 TABLET(81 MG) BY MOUTH DAILY. SWALLOW WHOLE 90 tablet 1   busPIRone (BUSPAR) 7.5 MG tablet TAKE 1 TABLET(7.5 MG) BY MOUTH THREE TIMES DAILY 90 tablet 2   carvedilol (COREG) 3.125 MG tablet TAKE 1 TABLET BY MOUTH TWICE DAILY WITH A MEAL 180 tablet 0   citalopram (CELEXA) 40 MG tablet TAKE 1 TABLET BY MOUTH EVERY DAY 90 tablet 0   clobetasol cream (TEMOVATE) 2.11 % Apply 1 application. topically 2 (two) times daily.     clonazePAM (KLONOPIN) 1 MG tablet TAKE 1 TABLET BY MOUTH EVERY MORNING, 1/2 TABLET EVERY AFTERNOON, AND 1 TABLET EVERY NIGHT AT BEDTIME. 75 tablet 0   diltiazem (CARDIZEM CD) 120 MG 24 hr capsule TAKE 1 CAPSULE BY MOUTH EVERY DAY 90  capsule 1   fluticasone furoate-vilanterol (BREO ELLIPTA) 100-25 MCG/ACT AEPB Inhale 1 puff into the lungs daily. 1 each 11   hydrOXYzine (ATARAX) 25 MG tablet Take 25 mg by mouth at bedtime.     ipratropium-albuterol (DUONEB) 0.5-2.5 (3) MG/3ML SOLN Take 3 mLs by nebulization every 6 (six) hours as needed. 360 mL    levothyroxine (SYNTHROID) 200 MCG tablet TAKE 1 TABLET(200 MCG) BY MOUTH DAILY 90 tablet 0   lisinopril (ZESTRIL) 10 MG tablet TAKE 1 TABLET(10 MG) BY MOUTH DAILY 90 tablet 0   Misc. Devices (QUAD CANE/SMALL BASE) MISC 1 Device by Does not apply route daily. 1 each 0   rosuvastatin (CRESTOR) 10 MG tablet TAKE 1 TABLET(10 MG) BY MOUTH DAILY 90 tablet 1   Vitamin D, Ergocalciferol,  (DRISDOL) 1.25 MG (50000 UNIT) CAPS capsule Take 50,000 Units by mouth 2 (two) times a week.     No current facility-administered medications for this visit.    Allergies  Allergen Reactions   Ciprofloxacin Hives and Diarrhea   Mucinex [Guaifenesin Er] Nausea Only    Per pt   Oxycodone Diarrhea   Oxycodone-Acetaminophen Diarrhea   Oxycodone-Acetaminophen Diarrhea   Sulfa Antibiotics Rash    Throat, mouth Throat, mouth    Family History  Problem Relation Age of Onset   Heart disease Mother    Breast cancer Mother    Heart attack Mother    COPD Father    Stroke Paternal Uncle    Kidney disease Sister    Breast cancer Cousin    Breast cancer Cousin    Heart attack Maternal Aunt    Stroke Paternal Grandfather    Kidney cancer Neg Hx    Bladder Cancer Neg Hx     Social History   Socioeconomic History   Marital status: Widowed    Spouse name: Not on file   Number of children: Not on file   Years of education: Not on file   Highest education level: Not on file  Occupational History   Occupation: retired  Tobacco Use   Smoking status: Former    Packs/day: 1.00    Years: 35.00    Total pack years: 35.00    Types: Cigarettes    Start date: 04/19/2018    Quit date: 11/16/2020    Years since quitting: 1.2   Smokeless tobacco: Never   Tobacco comments:    Previously smoked 2 ppd.   Vaping Use   Vaping Use: Never used  Substance and Sexual Activity   Alcohol use: No   Drug use: No   Sexual activity: Not Currently    Birth control/protection: Surgical  Other Topics Concern   Not on file  Social History Narrative   Not on file   Social Determinants of Health   Financial Resource Strain: Low Risk  (04/21/2021)   Overall Financial Resource Strain (CARDIA)    Difficulty of Paying Living Expenses: Not hard at all  Food Insecurity: No Food Insecurity (04/21/2021)   Hunger Vital Sign    Worried About Running Out of Food in the Last Year: Never true    Ran Out of  Food in the Last Year: Never true  Transportation Needs: No Transportation Needs (04/21/2021)   PRAPARE - Hydrologist (Medical): No    Lack of Transportation (Non-Medical): No  Physical Activity: Insufficiently Active (04/21/2021)   Exercise Vital Sign    Days of Exercise per Week: 2 days    Minutes of Exercise  per Session: 30 min  Stress: No Stress Concern Present (04/21/2021)   [place]    Feeling of Stress : Only a little  Social Connections: Unknown (04/21/2021)   Social Connection and Isolation Panel [NHANES]    Frequency of Communication with Friends and Family: More than three times a week    Frequency of Social Gatherings with Friends and Family: Three times a week    Attends Religious Services: Never    Active Member of Clubs or Organizations: No    Attends Archivist Meetings: Never    Marital Status: Not on file  Intimate Partner Violence: Not At Risk (04/21/2021)   Humiliation, Afraid, Rape, and Kick questionnaire    Fear of Current or Ex-Partner: No    Emotionally Abused: No    Physically Abused: No    Sexually Abused: No     Constitutional: Denies fever, malaise, fatigue, headache or abrupt weight changes.  HEENT: Denies eye pain, eye redness, ear pain, ringing in the ears, wax buildup, runny nose, nasal congestion, bloody nose, or sore throat. Respiratory: Denies difficulty breathing, shortness of breath, cough or sputum production.   Cardiovascular: Denies chest pain, chest tightness, palpitations or swelling in the hands or feet.  Gastrointestinal: Denies abdominal pain, bloating, constipation, diarrhea or blood in the stool.  GU: Denies urgency, frequency, pain with urination, burning sensation, blood in urine, odor or discharge. Musculoskeletal: Patient reports joint pain.  Denies decrease in range of motion, difficulty with gait, muscle pain or joint swelling.   Skin: Denies redness, rashes, lesions or ulcercations.  Neurological: Patient reports insomnia.  Denies dizziness, difficulty with memory, difficulty with speech or problems with balance and coordination.  Psych: Patient has a history of anxiety and depression.  Denies SI/HI.  No other specific complaints in a complete review of systems (except as listed in HPI above).  Objective:   Physical Exam BP 134/68 (BP Location: Right Arm, Patient Position: Sitting, Cuff Size: Normal)   Pulse 77   Temp (!) 96.8 F (36 C) (Temporal)   Wt 201 lb (91.2 kg)   SpO2 95%   BMI 35.61 kg/m   Wt Readings from Last 3 Encounters:  07/25/21 202 lb (91.6 kg)  04/21/21 200 lb (90.7 kg)  04/20/21 200 lb (90.7 kg)    General: Appears t her stated age, obese, in NAD. Skin: Warm, dry and intact. No ulcerations noted. HEENT: Head: normal shape and size; Eyes: sclera white, no icterus, conjunctiva pink, PERRLA and EOMs intact;  Neck:  Neck supple, trachea midline. No masses, lumps or thyromegaly present.  Cardiovascular: Normal rate and rhythm. S1,S2 noted.  No murmur, rubs or gallops noted. No JVD or BLE edema. No carotid bruits noted. Pulmonary/Chest: Normal effort and positive vesicular breath sounds. No respiratory distress. No wheezes, rales or ronchi noted.  Abdomen: Soft and nontender. Normal bowel sounds. Musculoskeletal: No difficulty with gait.  Neurological: Alert and oriented. Coordination normal.  Psychiatric: Mood and affect normal.  Anxious appearing.  Judgment and thought content normal.    BMET    Component Value Date/Time   NA 142 07/25/2021 1019   NA 142 06/24/2015 1102   NA 135 (L) 02/02/2013 1021   K 4.4 07/25/2021 1019   K 3.7 02/02/2013 1021   CL 99 07/25/2021 1019   CL 103 02/02/2013 1021   CO2 34 (H) 07/25/2021 1019   CO2 30 02/02/2013 1021   GLUCOSE 141 (H) 07/25/2021 1019  GLUCOSE 152 (H) 02/02/2013 1021   BUN 15 07/25/2021 1019   BUN 12 06/24/2015 1102   BUN 8  02/02/2013 1021   CREATININE 0.91 07/25/2021 1019   CALCIUM 9.6 07/25/2021 1019   CALCIUM 9.9 02/02/2013 1021   GFRNONAA >60 12/09/2020 0701   GFRNONAA 72 12/03/2019 0947   GFRAA 84 12/03/2019 0947    Lipid Panel     Component Value Date/Time   CHOL 115 07/25/2021 1019   CHOL 205 (H) 02/03/2015 1022   TRIG 174 (H) 07/25/2021 1019   HDL 43 (L) 07/25/2021 1019   HDL 36 (L) 02/03/2015 1022   CHOLHDL 2.7 07/25/2021 1019   LDLCALC 47 07/25/2021 1019    CBC    Component Value Date/Time   WBC 8.0 07/25/2021 1019   RBC 5.15 (H) 07/25/2021 1019   HGB 15.5 07/25/2021 1019   HGB 15.6 06/24/2015 1102   HCT 48.6 (H) 07/25/2021 1019   HCT 46.6 06/24/2015 1102   PLT 227 07/25/2021 1019   PLT 307 06/24/2015 1102   MCV 94.4 07/25/2021 1019   MCV 93 06/24/2015 1102   MCV 90 02/02/2013 1021   MCH 30.1 07/25/2021 1019   MCHC 31.9 (L) 07/25/2021 1019   RDW 11.9 07/25/2021 1019   RDW 13.3 06/24/2015 1102   RDW 13.2 02/02/2013 1021   LYMPHSABS 2.2 12/02/2020 1612   LYMPHSABS 2.3 06/24/2015 1102   MONOABS 0.8 12/02/2020 1612   EOSABS 0.2 12/02/2020 1612   EOSABS 0.3 06/24/2015 1102   BASOSABS 0.1 12/02/2020 1612   BASOSABS 0.1 06/24/2015 1102    Hgb A1C Lab Results  Component Value Date   HGBA1C 6.6 (H) 07/25/2021            Assessment & Plan:      RTC in 6 months for your annual exam [name], NP

## 2022-03-06 ENCOUNTER — Other Ambulatory Visit: Payer: Self-pay

## 2022-03-06 DIAGNOSIS — E039 Hypothyroidism, unspecified: Secondary | ICD-10-CM

## 2022-03-06 DIAGNOSIS — E782 Mixed hyperlipidemia: Secondary | ICD-10-CM

## 2022-03-06 LAB — COMPLETE METABOLIC PANEL WITH GFR
AG Ratio: 1.7 (calc) (ref 1.0–2.5)
ALT: 12 U/L (ref 6–29)
AST: 13 U/L (ref 10–35)
Albumin: 4 g/dL (ref 3.6–5.1)
Alkaline phosphatase (APISO): 86 U/L (ref 37–153)
BUN: 13 mg/dL (ref 7–25)
CO2: 33 mmol/L — ABNORMAL HIGH (ref 20–32)
Calcium: 9.5 mg/dL (ref 8.6–10.4)
Chloride: 100 mmol/L (ref 98–110)
Creat: 0.66 mg/dL (ref 0.60–1.00)
Globulin: 2.3 g/dL (calc) (ref 1.9–3.7)
Glucose, Bld: 122 mg/dL — ABNORMAL HIGH (ref 65–99)
Potassium: 4.2 mmol/L (ref 3.5–5.3)
Sodium: 142 mmol/L (ref 135–146)
Total Bilirubin: 0.4 mg/dL (ref 0.2–1.2)
Total Protein: 6.3 g/dL (ref 6.1–8.1)
eGFR: 93 mL/min/{1.73_m2} (ref >=60)

## 2022-03-06 LAB — CBC
HCT: 45.9 % — ABNORMAL HIGH (ref 35.0–45.0)
Hemoglobin: 15.1 g/dL (ref 11.7–15.5)
MCH: 29.9 pg (ref 27.0–33.0)
MCHC: 32.9 g/dL (ref 32.0–36.0)
MCV: 90.9 fL (ref 80.0–100.0)
MPV: 11.1 fL (ref 7.5–12.5)
Platelets: 229 10*3/uL (ref 140–400)
RBC: 5.05 10*6/uL (ref 3.80–5.10)
RDW: 11.5 % (ref 11.0–15.0)
WBC: 8.6 10*3/uL (ref 3.8–10.8)

## 2022-03-06 LAB — LIPID PANEL
Cholesterol: 107 mg/dL (ref <200)
HDL: 40 mg/dL — ABNORMAL LOW (ref >=50)
LDL Cholesterol (Calc): 41 mg/dL (calc)
Non-HDL Cholesterol (Calc): 67 mg/dL (calc) (ref <130)
Total CHOL/HDL Ratio: 2.7 (calc) (ref <5.0)
Triglycerides: 188 mg/dL — ABNORMAL HIGH (ref <150)

## 2022-03-06 LAB — T4, FREE: Free T4: 1.7 ng/dL (ref 0.8–1.8)

## 2022-03-06 LAB — MICROALBUMIN / CREATININE URINE RATIO
Creatinine, Urine: 76 mg/dL (ref 20–275)
Microalb Creat Ratio: 8 mcg/mg creat (ref <30)
Microalb, Ur: 0.6 mg/dL

## 2022-03-06 LAB — TSH: TSH: 0.06 mIU/L — ABNORMAL LOW (ref 0.40–4.50)

## 2022-03-06 MED ORDER — FENOFIBRATE 145 MG PO TABS: 145.0000 mg | ORAL_TABLET | Freq: Every day | ORAL | 1 refills | Status: DC

## 2022-03-06 MED ORDER — LEVOTHYROXINE SODIUM 175 MCG PO TABS: 175.0000 ug | ORAL_TABLET | Freq: Every day | ORAL | 1 refills | Status: DC

## 2022-03-06 NOTE — Telephone Encounter (Signed)
I have sent levothyroxine and fenofibrate to pharmacy.  Please have her schedule lab only appointment in 2 months to repeat lipids and TSH

## 2022-03-06 NOTE — Telephone Encounter (Signed)
-----  Message from Jearld Fenton, NP sent at 03/06/2022  9:01 AM EST ----- Blood counts are normal.  Liver and kidney function is normal.  TSH is very low.  We need to decrease her levothyroxine to 175 mcg daily.  Please let me know if she is agreeable with this and I will send this in.  Cholesterol looks okay but triglycerides remain elevated and HDL is low.  Would she be willing to add fenofibrate in addition to her rosuvastatin to help improve triglycerides?.  She should consume a low saturated fat, low carb diet and exercise for weight loss.

## 2022-03-06 NOTE — Telephone Encounter (Signed)
Pt advised.  She agreed to start fenofibrate and change to dose of Levothyroxine.  Please send to [place]

## 2022-04-02 ENCOUNTER — Other Ambulatory Visit: Payer: Self-pay | Admitting: Internal Medicine

## 2022-04-02 DIAGNOSIS — F410 Panic disorder [episodic paroxysmal anxiety] without agoraphobia: Secondary | ICD-10-CM

## 2022-04-02 NOTE — Telephone Encounter (Signed)
Requested medication (s) are due for refill today -yes  Requested medication (s) are on the active medication list -yes  Future visit scheduled -no  Last refill: 03/05/22 #75  Notes to clinic: non delegated Rx  Requested Prescriptions  Pending Prescriptions Disp Refills   clonazePAM (KLONOPIN) 1 MG tablet [Pharmacy Med Name: CLONAZEPAM '1MG'$  TABLETS] 75 tablet     Sig: TAKE 1 TABLET BY MOUTH EVERY MORNING, 1/2 TABLET EVERY AFTERNOON, AND 1 TABLET EVERY NIGHT AT BEDTIME.     Not Delegated - Psychiatry: Anxiolytics/Hypnotics 2 Failed - 04/02/2022  8:29 AM      Failed - This refill cannot be delegated      Failed - Urine Drug Screen completed in last 360 days      Passed - Patient is not pregnant      Passed - Valid encounter within last 6 months    Recent Outpatient Visits           4 weeks ago Mixed hyperlipidemia   [facility] [place], Coralie Keens, NP   8 months ago Acquired hypothyroidism   [facility] [place], Coralie Keens, NP   11 months ago Screening for colon cancer   [facility] [place], Coralie Keens, NP   1 year ago Benign essential HTN   [facility] [place], Coralie Keens, NP   1 year ago Viral URI with cough   [facility] [place], Coralie Keens, NP                 Requested Prescriptions  Pending Prescriptions Disp Refills   clonazePAM (KLONOPIN) 1 MG tablet [Pharmacy Med Name: CLONAZEPAM '1MG'$  TABLETS] 75 tablet     Sig: TAKE 1 TABLET BY MOUTH EVERY MORNING, 1/2 TABLET EVERY AFTERNOON, AND 1 TABLET EVERY NIGHT AT BEDTIME.     Not Delegated - Psychiatry: Anxiolytics/Hypnotics 2 Failed - 04/02/2022  8:29 AM      Failed - This refill cannot be delegated      Failed - Urine Drug Screen completed in last 360 days      Passed - Patient is not pregnant      Passed - Valid encounter within last 6 months    Recent Outpatient Visits           4 weeks  ago Mixed hyperlipidemia   [place], Coralie Keens, NP   8 months ago Acquired hypothyroidism   [facility] [place], Coralie Keens, NP   11 months ago Screening for colon cancer   [facility] [place], Coralie Keens, NP   1 year ago Benign essential HTN   [facility] [place], Coralie Keens, NP   1 year ago Viral URI with cough   [facility] [place], Coralie Keens, [place]

## 2022-04-12 ENCOUNTER — Emergency Department: Payer: PPO

## 2022-04-12 ENCOUNTER — Inpatient Hospital Stay
Admission: EM | Admit: 2022-04-12 | Discharge: 2022-04-16 | DRG: 190 | Disposition: A | Discharge status: Home / Self Care | Payer: PPO | Attending: Osteopathic Medicine | Admitting: Osteopathic Medicine

## 2022-04-12 ENCOUNTER — Other Ambulatory Visit: Payer: Self-pay

## 2022-04-12 DIAGNOSIS — Z882 Allergy status to sulfonamides status: Secondary | ICD-10-CM

## 2022-04-12 DIAGNOSIS — M549 Dorsalgia, unspecified: Secondary | ICD-10-CM | POA: Diagnosis present

## 2022-04-12 DIAGNOSIS — Z7982 Long term (current) use of aspirin: Secondary | ICD-10-CM | POA: Diagnosis not present

## 2022-04-12 DIAGNOSIS — Z1152 Encounter for screening for COVID-19: Secondary | ICD-10-CM | POA: Diagnosis not present

## 2022-04-12 DIAGNOSIS — J441 Chronic obstructive pulmonary disease with (acute) exacerbation: Principal | ICD-10-CM | POA: Diagnosis present

## 2022-04-12 DIAGNOSIS — E119 Type 2 diabetes mellitus without complications: Secondary | ICD-10-CM | POA: Diagnosis present

## 2022-04-12 DIAGNOSIS — G8929 Other chronic pain: Secondary | ICD-10-CM | POA: Diagnosis present

## 2022-04-12 DIAGNOSIS — J189 Pneumonia, unspecified organism: Secondary | ICD-10-CM | POA: Diagnosis present

## 2022-04-12 DIAGNOSIS — Z90710 Acquired absence of both cervix and uterus: Secondary | ICD-10-CM | POA: Diagnosis not present

## 2022-04-12 DIAGNOSIS — Z961 Presence of intraocular lens: Secondary | ICD-10-CM | POA: Diagnosis present

## 2022-04-12 DIAGNOSIS — F32A Depression, unspecified: Secondary | ICD-10-CM | POA: Diagnosis present

## 2022-04-12 DIAGNOSIS — Z885 Allergy status to narcotic agent status: Secondary | ICD-10-CM

## 2022-04-12 DIAGNOSIS — J42 Unspecified chronic bronchitis: Secondary | ICD-10-CM

## 2022-04-12 DIAGNOSIS — J432 Centrilobular emphysema: Secondary | ICD-10-CM

## 2022-04-12 DIAGNOSIS — F419 Anxiety disorder, unspecified: Secondary | ICD-10-CM | POA: Diagnosis present

## 2022-04-12 DIAGNOSIS — F17210 Nicotine dependence, cigarettes, uncomplicated: Secondary | ICD-10-CM | POA: Diagnosis present

## 2022-04-12 DIAGNOSIS — M1990 Unspecified osteoarthritis, unspecified site: Secondary | ICD-10-CM | POA: Diagnosis present

## 2022-04-12 DIAGNOSIS — Z7951 Long term (current) use of inhaled steroids: Secondary | ICD-10-CM

## 2022-04-12 DIAGNOSIS — J9601 Acute respiratory failure with hypoxia: Secondary | ICD-10-CM | POA: Diagnosis present

## 2022-04-12 DIAGNOSIS — Z79890 Hormone replacement therapy (postmenopausal): Secondary | ICD-10-CM

## 2022-04-12 DIAGNOSIS — E039 Hypothyroidism, unspecified: Secondary | ICD-10-CM | POA: Diagnosis present

## 2022-04-12 DIAGNOSIS — Z881 Allergy status to other antibiotic agents status: Secondary | ICD-10-CM | POA: Diagnosis not present

## 2022-04-12 DIAGNOSIS — Z79899 Other long term (current) drug therapy: Secondary | ICD-10-CM

## 2022-04-12 DIAGNOSIS — J440 Chronic obstructive pulmonary disease with acute lower respiratory infection: Secondary | ICD-10-CM | POA: Diagnosis present

## 2022-04-12 DIAGNOSIS — I10 Essential (primary) hypertension: Secondary | ICD-10-CM | POA: Diagnosis present

## 2022-04-12 DIAGNOSIS — Z90722 Acquired absence of ovaries, bilateral: Secondary | ICD-10-CM

## 2022-04-12 DIAGNOSIS — K219 Gastro-esophageal reflux disease without esophagitis: Secondary | ICD-10-CM | POA: Diagnosis present

## 2022-04-12 LAB — CBC WITH DIFFERENTIAL/PLATELET
Abs Immature Granulocytes: 0.05 10*3/uL (ref 0.00–0.07)
Basophils Absolute: 0 10*3/uL (ref 0.0–0.1)
Basophils Relative: 0 %
Eosinophils Absolute: 0.5 10*3/uL (ref 0.0–0.5)
Eosinophils Relative: 5 %
HCT: 50.5 % — ABNORMAL HIGH (ref 36.0–46.0)
Hemoglobin: 15.8 g/dL — ABNORMAL HIGH (ref 12.0–15.0)
Immature Granulocytes: 1 %
Lymphocytes Relative: 22 %
Lymphs Abs: 2.4 10*3/uL (ref 0.7–4.0)
MCH: 29.5 pg (ref 26.0–34.0)
MCHC: 31.3 g/dL (ref 30.0–36.0)
MCV: 94.4 fL (ref 80.0–100.0)
Monocytes Absolute: 1.1 10*3/uL — ABNORMAL HIGH (ref 0.1–1.0)
Monocytes Relative: 10 %
Neutro Abs: 6.9 10*3/uL (ref 1.7–7.7)
Neutrophils Relative %: 62 %
Platelets: 308 10*3/uL (ref 150–400)
RBC: 5.35 MIL/uL — ABNORMAL HIGH (ref 3.87–5.11)
RDW: 12.6 % (ref 11.5–15.5)
WBC: 10.9 10*3/uL — ABNORMAL HIGH (ref 4.0–10.5)
nRBC: 0.2 % (ref 0.0–0.2)

## 2022-04-12 LAB — BASIC METABOLIC PANEL
Anion gap: 7 (ref 5–15)
BUN: 31 mg/dL — ABNORMAL HIGH (ref 8–23)
CO2: 34 mmol/L — ABNORMAL HIGH (ref 22–32)
Calcium: 8.6 mg/dL — ABNORMAL LOW (ref 8.9–10.3)
Chloride: 93 mmol/L — ABNORMAL LOW (ref 98–111)
Creatinine, Ser: 0.73 mg/dL (ref 0.44–1.00)
GFR, Estimated: >60 mL/min (ref >60)
Glucose, Bld: 151 mg/dL — ABNORMAL HIGH (ref 70–99)
Potassium: 3.9 mmol/L (ref 3.5–5.1)
Sodium: 134 mmol/L — ABNORMAL LOW (ref 135–145)

## 2022-04-12 LAB — RESP PANEL BY RT-PCR (RSV, FLU A&B, COVID)  RVPGX2
Influenza A by PCR: NEGATIVE
Influenza B by PCR: NEGATIVE
Resp Syncytial Virus by PCR: NEGATIVE
SARS Coronavirus 2 by RT PCR: NEGATIVE

## 2022-04-12 MED ORDER — SODIUM CHLORIDE 0.9 % IV SOLN
2.0000 g | INTRAVENOUS | Status: DC
  Administered 2022-04-13 – 2022-04-15 (×3): 2 g via INTRAVENOUS
  Filled 2022-04-12: qty 2
  Filled 2022-04-12: qty 20
  Filled 2022-04-12 (×2): qty 2

## 2022-04-12 MED ORDER — SODIUM CHLORIDE 0.9 % IV SOLN
500.0000 mg | INTRAVENOUS | Status: DC
  Administered 2022-04-13: 500 mg via INTRAVENOUS
  Filled 2022-04-12: qty 500

## 2022-04-12 MED ORDER — BENZONATATE 100 MG PO CAPS
200.0000 mg | ORAL_CAPSULE | Freq: Two times a day (BID) | ORAL | Status: DC | PRN
  Administered 2022-04-13 – 2022-04-15 (×3): 200 mg via ORAL
  Filled 2022-04-12 (×3): qty 2

## 2022-04-12 MED ORDER — ACETAMINOPHEN 650 MG RE SUPP: 650.0000 mg | Freq: Four times a day (QID) | RECTAL | Status: DC | PRN

## 2022-04-12 MED ORDER — CARVEDILOL 6.25 MG PO TABS
3.1250 mg | ORAL_TABLET | Freq: Two times a day (BID) | ORAL | Status: DC
  Administered 2022-04-13 – 2022-04-16 (×6): 3.125 mg via ORAL
  Filled 2022-04-12 (×7): qty 1

## 2022-04-12 MED ORDER — DILTIAZEM HCL ER COATED BEADS 120 MG PO CP24
120.0000 mg | ORAL_CAPSULE | Freq: Every day | ORAL | Status: DC
  Administered 2022-04-13 – 2022-04-16 (×4): 120 mg via ORAL
  Filled 2022-04-12 (×4): qty 1

## 2022-04-12 MED ORDER — MAGNESIUM SULFATE 2 GM/50ML IV SOLN
2.0000 g | INTRAVENOUS | Status: AC
  Administered 2022-04-12: 2 g via INTRAVENOUS
  Filled 2022-04-12: qty 50

## 2022-04-12 MED ORDER — PREDNISONE 20 MG PO TABS
40.0000 mg | ORAL_TABLET | Freq: Every day | ORAL | Status: DC
  Administered 2022-04-14 – 2022-04-16 (×3): 40 mg via ORAL
  Filled 2022-04-12 (×3): qty 2

## 2022-04-12 MED ORDER — ACETAMINOPHEN 325 MG PO TABS: 650.0000 mg | ORAL_TABLET | Freq: Four times a day (QID) | ORAL | Status: DC | PRN

## 2022-04-12 MED ORDER — ASPIRIN 81 MG PO TBEC
81.0000 mg | DELAYED_RELEASE_TABLET | Freq: Every day | ORAL | Status: DC
  Administered 2022-04-13 – 2022-04-16 (×4): 81 mg via ORAL
  Filled 2022-04-12 (×4): qty 1

## 2022-04-12 MED ORDER — ONDANSETRON HCL 4 MG/2ML IJ SOLN: 4.0000 mg | Freq: Four times a day (QID) | INTRAMUSCULAR | Status: DC | PRN

## 2022-04-12 MED ORDER — ONDANSETRON HCL 4 MG PO TABS: 4.0000 mg | ORAL_TABLET | Freq: Four times a day (QID) | ORAL | Status: DC | PRN

## 2022-04-12 MED ORDER — IPRATROPIUM-ALBUTEROL 0.5-2.5 (3) MG/3ML IN SOLN
3.0000 mL | Freq: Four times a day (QID) | RESPIRATORY_TRACT | Status: DC
  Administered 2022-04-13 – 2022-04-14 (×6): 3 mL via RESPIRATORY_TRACT
  Filled 2022-04-12 (×6): qty 3

## 2022-04-12 MED ORDER — CITALOPRAM HYDROBROMIDE 20 MG PO TABS
40.0000 mg | ORAL_TABLET | Freq: Every day | ORAL | Status: DC
  Administered 2022-04-13 – 2022-04-16 (×4): 40 mg via ORAL
  Filled 2022-04-12 (×4): qty 2

## 2022-04-12 MED ORDER — ALBUTEROL SULFATE (2.5 MG/3ML) 0.083% IN NEBU
2.5000 mg | INHALATION_SOLUTION | RESPIRATORY_TRACT | Status: DC | PRN
  Administered 2022-04-13: 2.5 mg via RESPIRATORY_TRACT
  Filled 2022-04-12: qty 3

## 2022-04-12 MED ORDER — BUSPIRONE HCL 5 MG PO TABS
7.5000 mg | ORAL_TABLET | Freq: Three times a day (TID) | ORAL | Status: DC
  Filled 2022-04-12 (×10): qty 1.5

## 2022-04-12 MED ORDER — LISINOPRIL 10 MG PO TABS
10.0000 mg | ORAL_TABLET | Freq: Every day | ORAL | Status: DC
  Administered 2022-04-13 – 2022-04-16 (×4): 10 mg via ORAL
  Filled 2022-04-12 (×4): qty 1

## 2022-04-12 MED ORDER — LEVOTHYROXINE SODIUM 175 MCG PO TABS
175.0000 ug | ORAL_TABLET | Freq: Every day | ORAL | Status: DC
  Administered 2022-04-13 – 2022-04-16 (×4): 175 ug via ORAL
  Filled 2022-04-12: qty 1
  Filled 2022-04-12 (×2): qty 4
  Filled 2022-04-12: qty 1

## 2022-04-12 MED ORDER — ROSUVASTATIN CALCIUM 10 MG PO TABS
10.0000 mg | ORAL_TABLET | Freq: Every day | ORAL | Status: DC
  Administered 2022-04-13 – 2022-04-16 (×4): 10 mg via ORAL
  Filled 2022-04-12 (×4): qty 1

## 2022-04-12 MED ORDER — FENOFIBRATE 160 MG PO TABS
160.0000 mg | ORAL_TABLET | Freq: Every day | ORAL | Status: DC
  Administered 2022-04-13 – 2022-04-16 (×4): 160 mg via ORAL
  Filled 2022-04-12 (×4): qty 1

## 2022-04-12 MED ORDER — SODIUM CHLORIDE 0.9 % IV SOLN
500.0000 mg | Freq: Once | INTRAVENOUS | Status: AC
  Administered 2022-04-12: 500 mg via INTRAVENOUS
  Filled 2022-04-12: qty 5

## 2022-04-12 MED ORDER — METHYLPREDNISOLONE SODIUM SUCC 125 MG IJ SOLR
125.0000 mg | INTRAMUSCULAR | Status: AC
  Administered 2022-04-12: 125 mg via INTRAVENOUS
  Filled 2022-04-12: qty 2

## 2022-04-12 MED ORDER — GUAIFENESIN ER 600 MG PO TB12: 600.0000 mg | ORAL_TABLET | Freq: Two times a day (BID) | ORAL | Status: DC

## 2022-04-12 MED ORDER — ENOXAPARIN SODIUM 40 MG/0.4ML IJ SOSY
40.0000 mg | PREFILLED_SYRINGE | INTRAMUSCULAR | Status: DC
  Administered 2022-04-12: 40 mg via SUBCUTANEOUS
  Filled 2022-04-12: qty 0.4

## 2022-04-12 MED ORDER — METHYLPREDNISOLONE SODIUM SUCC 40 MG IJ SOLR
40.0000 mg | Freq: Two times a day (BID) | INTRAMUSCULAR | Status: AC
  Administered 2022-04-12 – 2022-04-13 (×2): 40 mg via INTRAVENOUS
  Filled 2022-04-12 (×2): qty 1

## 2022-04-12 MED ORDER — IPRATROPIUM-ALBUTEROL 0.5-2.5 (3) MG/3ML IN SOLN
3.0000 mL | Freq: Once | RESPIRATORY_TRACT | Status: AC
  Administered 2022-04-12: 3 mL via RESPIRATORY_TRACT
  Filled 2022-04-12: qty 3

## 2022-04-12 MED ORDER — SODIUM CHLORIDE 0.9 % IV SOLN
2.0000 g | Freq: Once | INTRAVENOUS | Status: AC
  Administered 2022-04-12: 2 g via INTRAVENOUS
  Filled 2022-04-12: qty 20

## 2022-04-12 NOTE — ED Notes (Signed)
BMP hemolyzed per lab, recollect sent.

## 2022-04-12 NOTE — Assessment & Plan Note (Signed)
Continue levothyroxine 

## 2022-04-12 NOTE — ED Notes (Signed)
Transport has arrived to take patient to [facility]

## 2022-04-12 NOTE — H&P (Signed)
History and Physical    Patient: [name] [id number] DOB: 11/21/1950 DOA: 04/12/2022 DOS: the patient was seen and examined on 04/12/2022 PCP: Jearld Fenton, NP  Patient coming from: Home  Chief Complaint:  Chief Complaint  Patient presents with   Shortness of Breath    HPI: [name] is a 72 y.o. female with medical history significant for , HTN, hypothyroidism, , anxiety and depression, who presents to the ED with shortness of breath which has been worsening progressively over the past 3 days.  She has been using her inhalers without adequate relief.  She denies chest pain, fever or chills.  She presented by EMS who recorded an SpO2 of 79% on room air improving to 100% after treatments administered and route. ED course and data review: Upon arrival O2 sat 88% requiring 2 to 3 L to maintain sats in the mid 90s.  Vitals otherwise unremarkable.  Respiratory viral panel negative.  WBC 10,900,.  EKG personally viewed and interpreted shows sinus rhythm at 64.  Chest x-ray showed ill-defined heterogeneous bibasilar opacities may be atelectasis or pneumonia and chronic bronchial thickening. Patient treated with DuoNeb, methylprednisolone and magnesium sulfate.  Started on Rocephin and azithromycin.  Hospitalist consulted for admission for COPD, CAP and acute respiratory failure.   Review of Systems: As mentioned in the history of present illness. All other systems reviewed and are negative.  Past Medical History:  Diagnosis Date   Anxiety    Arthritis    Chronic back pain    COPD (chronic obstructive pulmonary disease) (HCC)    Depression    GERD (gastroesophageal reflux disease)    Hypertension    Hypothyroidism    Pneumonia    RECENT X 3   PONV (postoperative nausea and vomiting)    Psoriasis    Thyroid disease    Past Surgical History:  Procedure Laterality Date   ABDOMINAL HYSTERECTOMY     heavy bleeding   bladder stimulator     CARPAL TUNNEL RELEASE     CATARACT  EXTRACTION W/PHACO Right 03/07/2015   Procedure: CATARACT EXTRACTION PHACO AND INTRAOCULAR LENS PLACEMENT ([place]);  Surgeon: Estill Cotta, MD;  Location: [facility] ORS;  Service: Ophthalmology;  Laterality: Right;  [name] 01:14 AP% 26.4 CDE 33.04 fluid pack lot # [id number]   CATARACT EXTRACTION W/PHACO Left 03/21/2015   Procedure: CATARACT EXTRACTION PHACO AND INTRAOCULAR LENS PLACEMENT ([facility]);  Surgeon: Estill Cotta, MD;  Location: [facility] ORS;  Service: Ophthalmology;  Laterality: Left;  [name] 01:21 AP% 25.6 CDE 39.78 fluid pack lot # [id number]   HAND SURGERY Right    INTERSTIM IMPLANT REMOVAL N/A 09/07/2015   Procedure: REMOVAL OF INTERSTIM IMPLANT;  Surgeon: Bjorn Loser, MD;  Location: [facility] ORS;  Service: Urology;  Laterality: N/A;   OOPHORECTOMY Right    ovarian cyst   Social History:  reports that she has been smoking cigarettes. She started smoking about 3 years ago. She has a 35.00 pack-year smoking history. She has never used smokeless tobacco. She reports that she does not drink alcohol and does not use drugs.  Allergies  Allergen Reactions   Ciprofloxacin Hives and Diarrhea   Mucinex [Guaifenesin Er] Nausea Only    Per pt   Oxycodone Diarrhea   Oxycodone-Acetaminophen Diarrhea   Oxycodone-Acetaminophen Diarrhea   Sulfa Antibiotics Rash    Throat, mouth Throat, mouth    Family History  Problem Relation Age of Onset   Heart disease Mother    Breast cancer Mother    Heart  attack Mother    COPD Father    Stroke Paternal Uncle    Kidney disease Sister    Breast cancer Cousin    Breast cancer Cousin    Heart attack Maternal Aunt    Stroke Paternal Grandfather    Kidney cancer Neg Hx    Bladder Cancer Neg Hx     Prior to Admission medications   Medication Sig Start Date End Date Taking? Authorizing Provider  fenofibrate (TRICOR) 145 MG tablet Take 1 tablet (145 mg total) by mouth daily. 03/06/22   Jearld Fenton, NP  levothyroxine (SYNTHROID) 175 MCG tablet Take 1 tablet  (175 mcg total) by mouth daily. 03/06/22   Jearld Fenton, NP  predniSONE (STERAPRED UNI-PAK 21 TAB) 10 MG (21) TBPK tablet See admin instructions. follow package directions 04/06/22  Yes [provider]  acetaminophen (TYLENOL) 500 MG tablet Take 500 mg by mouth every 6 (six) hours as needed for moderate pain.     [provider]  albuterol (VENTOLIN HFA) 108 (90 Base) MCG/ACT inhaler INHALE 2 PUFFS BY MOUTH EVERY 6 HOURS AS NEEDED FOR WHEEZING OR SHORTNESS OF BREATH 08/11/21   Baity, Coralie Keens, NP  ASPIRIN LOW DOSE 81 MG tablet TAKE 1 TABLET(81 MG) BY MOUTH DAILY. SWALLOW WHOLE 02/01/22   Jearld Fenton, NP  busPIRone (BUSPAR) 7.5 MG tablet TAKE 1 TABLET(7.5 MG) BY MOUTH THREE TIMES DAILY 07/18/21   Jearld Fenton, NP  carvedilol (COREG) 3.125 MG tablet TAKE 1 TABLET BY MOUTH TWICE DAILY WITH A MEAL 01/09/22   Jearld Fenton, NP  citalopram (CELEXA) 40 MG tablet Take 1 tablet (40 mg total) by mouth daily. 03/05/22   Jearld Fenton, NP  clobetasol cream (TEMOVATE) [id number] % Apply 1 application. topically 2 (two) times daily. 01/24/21   [provider]  clonazePAM (KLONOPIN) 1 MG tablet TAKE 1 TABLET BY MOUTH EVERY MORNING, 1/2 TABLET EVERY AFTERNOON AND 1 TABLET EVERY NIGHT AT BEDTIME 04/03/22   Jearld Fenton, NP  diltiazem (CARDIZEM CD) 120 MG 24 hr capsule TAKE 1 CAPSULE BY MOUTH EVERY DAY 09/01/21   Jearld Fenton, NP  fluticasone furoate-vilanterol (BREO ELLIPTA) 100-25 MCG/ACT AEPB Inhale 1 puff into the lungs daily. 01/24/21   Jearld Fenton, NP  hydrOXYzine (ATARAX) 25 MG tablet Take 25 mg by mouth at bedtime. 01/24/21   [provider]  ipratropium-albuterol (DUONEB) 0.5-2.5 (3) MG/3ML SOLN Take 3 mLs by nebulization every 6 (six) hours as needed. 12/12/20   Shelly Coss, MD  lisinopril (ZESTRIL) 10 MG tablet TAKE 1 TABLET(10 MG) BY MOUTH DAILY 01/09/22   Jearld Fenton, NP  Misc. Devices (QUAD CANE/SMALL BASE) MISC 1 Device by Does not apply route daily.  06/26/17   Mikey College, NP  rosuvastatin (CRESTOR) 10 MG tablet TAKE 1 TABLET(10 MG) BY MOUTH DAILY 02/01/22   Jearld Fenton, NP  Vitamin D, Ergocalciferol, (DRISDOL) 1.25 MG (50000 UNIT) CAPS capsule Take 50,000 Units by mouth 2 (two) times a week. 03/16/21   [provider]    Physical Exam: Vitals:   04/12/22 1844 04/12/22 1858 04/12/22 1900 04/12/22 1925  BP:   (!) 137/53 (!) 141/56  Pulse:   69 66  Resp:   16 15  Temp:    98.4 F (36.9 C)  TempSrc:    Oral  SpO2: 91% 93%  94%  Weight:      Height:       Physical Exam Vitals and nursing note  reviewed.  Constitutional:      General: She is not in acute distress. HENT:     Head: Normocephalic and atraumatic.  Cardiovascular:     Rate and Rhythm: Normal rate and regular rhythm.     Heart sounds: Normal heart sounds.  Pulmonary:     Effort: Pulmonary effort is normal.     Breath sounds: Wheezing and rhonchi present.  Abdominal:     Palpations: Abdomen is soft.     Tenderness: There is no abdominal tenderness.  Neurological:     Mental Status: Mental status is at baseline.     Labs on Admission: I have personally reviewed following labs and imaging studies  CBC: Recent Labs  Lab 04/12/22 1630  WBC 10.9*  NEUTROABS 6.9  HGB 15.8*  HCT 50.5*  MCV 94.4  PLT [id number]   Basic Metabolic Panel: Recent Labs  Lab 04/12/22 1730  NA 134*  K 3.9  CL 93*  CO2 34*  GLUCOSE 151*  BUN 31*  CREATININE 0.73  CALCIUM 8.6*   GFR: Estimated Creatinine Clearance: 67.2 mL/min (by C-G formula based on SCr of 0.73 mg/dL). Liver Function Tests: No results for input(s): "AST", "ALT", "ALKPHOS", "BILITOT", "PROT", "ALBUMIN" in the last 168 hours. No results for input(s): "LIPASE", "AMYLASE" in the last 168 hours. No results for input(s): "AMMONIA" in the last 168 hours. Coagulation Profile: No results for input(s): "INR", "PROTIME" in the last 168 hours. Cardiac Enzymes: No results for input(s): "CKTOTAL",  "CKMB", "CKMBINDEX", "TROPONINI" in the last 168 hours. BNP (last 3 results) No results for input(s): "PROBNP" in the last 8760 hours. HbA1C: No results for input(s): "HGBA1C" in the last 72 hours. CBG: No results for input(s): "GLUCAP" in the last 168 hours. Lipid Profile: No results for input(s): "CHOL", "HDL", "LDLCALC", "TRIG", "CHOLHDL", "LDLDIRECT" in the last 72 hours. Thyroid Function Tests: No results for input(s): "TSH", "T4TOTAL", "FREET4", "T3FREE", "THYROIDAB" in the last 72 hours. Anemia Panel: No results for input(s): "VITAMINB12", "FOLATE", "FERRITIN", "TIBC", "IRON", "RETICCTPCT" in the last 72 hours. Urine analysis:    Component Value Date/Time   COLORURINE YELLOW (A) 12/30/2017 0649   APPEARANCEUR CLEAR (A) 12/30/2017 0649   APPEARANCEUR Clear 07/26/2016 1445   LABSPEC 1.013 12/30/2017 0649   LABSPEC 1.012 10/10/2011 1219   PHURINE 6.0 12/30/2017 0649   GLUCOSEU NEGATIVE 12/30/2017 0649   GLUCOSEU Negative 10/10/2011 1219   HGBUR NEGATIVE 12/30/2017 0649   BILIRUBINUR negative 07/10/2019 0934   BILIRUBINUR Negative 07/26/2016 1445   BILIRUBINUR Negative 10/10/2011 [place] 12/30/2017 0649   PROTEINUR Negative 07/10/2019 0934   PROTEINUR NEGATIVE 12/30/2017 0649   UROBILINOGEN 0.2 07/10/2019 0934   NITRITE negative 07/10/2019 0934   NITRITE NEGATIVE 12/30/2017 0649   LEUKOCYTESUR Small (1+) (A) 07/10/2019 0934   LEUKOCYTESUR 3+ (A) 07/26/2016 1445   LEUKOCYTESUR 2+ 10/10/2011 1219    Radiological Exams on Admission: DG Chest Portable 1 View  Result Date: 04/12/2022 CLINICAL DATA:  Shortness of breath. EXAM: PORTABLE CHEST 1 VIEW COMPARISON:  12/06/2020, CT 12/07/2020 FINDINGS: Normal heart size with stable mediastinal contours. Aortic atherosclerosis. Chronic peribronchial thickening. Ill-defined heterogeneous bibasilar opacities. No pneumothorax or large pleural effusion. No acute osseous abnormalities on this portable view. IMPRESSION: 1.  Ill-defined heterogeneous bibasilar opacities may be atelectasis or pneumonia. 2. Chronic bronchial thickening. Electronically Signed   By: Keith Rake M.D.   On: 04/12/2022 16:45     Data Reviewed: Relevant notes from primary care and specialist visits, past discharge summaries as  available in EHR, including Care Everywhere. Prior diagnostic testing as pertinent to current admission diagnoses Updated medications and problem lists for reconciliation ED course, including vitals, labs, imaging, treatment and response to treatment Triage notes, nursing and pharmacy notes and ED provider's notes Notable results as noted in HPI   Assessment and Plan: * COPD with acute exacerbation ([place]) CAP Acute respiratory failure with hypoxia Scheduled and as needed nebulized bronchodilator treatment Systemic steroids Rocephin and azithromycin Supplemental O2 to keep sats over 92%  Hypothyroidism Continue levothyroxine  Benign essential HTN Continue lisinopril carvedilol and diltiazem  Anxiety and depression Continue citalopram and buspirone, clonazepam and Atarax pending verification        DVT prophylaxis: Lovenox  Consults: none  Advance Care Planning:   Code Status: Prior   Family Communication: none  Disposition Plan: Back to previous home environment  Severity of Illness: The appropriate patient status for this patient is INPATIENT. Inpatient status is judged to be reasonable and necessary in order to provide the required intensity of service to ensure the patient's safety. The patient's presenting symptoms, physical exam findings, and initial radiographic and laboratory data in the context of their chronic comorbidities is felt to place them at high risk for further clinical deterioration. Furthermore, it is not anticipated that the patient will be medically stable for discharge from the hospital within 2 midnights of admission.   * I certify that at the point of admission  it is my clinical judgment that the patient will require inpatient hospital care spanning beyond 2 midnights from the point of admission due to high intensity of service, high risk for further deterioration and high frequency of surveillance required.*  Author: Athena Masse, MD 04/12/2022 7:54 PM  For on call review [email or web address].

## 2022-04-12 NOTE — Assessment & Plan Note (Signed)
Continue lisinopril carvedilol and diltiazem

## 2022-04-12 NOTE — Assessment & Plan Note (Signed)
CAP Acute respiratory failure with hypoxia Scheduled and as needed nebulized bronchodilator treatment Systemic steroids Rocephin and azithromycin Supplemental O2 to keep sats over 92%

## 2022-04-12 NOTE — Assessment & Plan Note (Signed)
Continue citalopram and buspirone, clonazepam and Atarax pending verification

## 2022-04-12 NOTE — ED Triage Notes (Signed)
Pt arrives by [facility] for shortness of breath at home that has gotten worse over trhe past 3 days. Pt has hx of copd. Received duoneb x3 and albuterol prior to arrival. Initial SPO2 79% on room air, 100% after treatments and 97% upon arrival.

## 2022-04-12 NOTE — ED Provider Notes (Signed)
[facility] Provider Note    Event Date/Time   First MD Initiated Contact with Patient 04/12/22 1620     (approximate)   History   Chief Complaint: Shortness of Breath   HPI  [name] is a 72 y.o. female with a history of hypertension, diabetes, COPD who comes the ED complaining of shortness of breath worsening over the past 3 days, associated with nonproductive cough.  EMS was called and they report her initial oxygen saturation was 79% on room air.  She had a total of 2 DuoNebs and an albuterol neb prior to arrival in the emergency department, requiring 4 L nasal cannula to maintain adequate oxygenation still.  Patient denies any chest pain or other pain complaints, no fever.     Physical Exam   Triage Vital Signs: ED Triage Vitals [04/12/22 1618]  Enc Vitals Group     BP      Pulse      Resp      Temp      Temp src      SpO2 (!) 88 %     Weight      Height      Head Circumference      Peak Flow      Pain Score      Pain Loc      Pain Edu?      Excl. in [place]?     Most recent vital signs: Vitals:   04/12/22 1830 04/12/22 1844  BP: 138/60   Pulse: (!) 59   Resp: (!) 21   Temp:    SpO2:  91%    General: Awake, no distress.  CV:  Good peripheral perfusion.  Regular rate and rhythm Resp:  Normal effort.  Diffuse expiratory wheezing and prolonged expiratory phase.  No focal crackles. Abd:  No distention.  Soft nontender Other:  No lower extremity edema or calf tenderness   ED Results / Procedures / Treatments   Labs (all labs ordered are listed, but only abnormal results are displayed) Labs Reviewed  CBC WITH DIFFERENTIAL/PLATELET - Abnormal; Notable for the following components:      Result Value   WBC 10.9 (*)    RBC 5.35 (*)    Hemoglobin 15.8 (*)    HCT 50.5 (*)    Monocytes Absolute 1.1 (*)    All other components within normal limits  BASIC METABOLIC PANEL - Abnormal; Notable for the following components:   Sodium 134  (*)    Chloride 93 (*)    CO2 34 (*)    Glucose, Bld 151 (*)    BUN 31 (*)    Calcium 8.6 (*)    All other components within normal limits  RESP PANEL BY RT-PCR (RSV, FLU A&B, COVID)  [id number]     EKG Interpreted by me Sinus rhythm rate of 64.  Normal axis intervals QRS ST segments and T waves.   RADIOLOGY Chest x-ray interpreted by me, shows basilar haziness.  Radiology report reviewed noting possible pneumonia   PROCEDURES:  Procedures   MEDICATIONS ORDERED IN ED: Medications  cefTRIAXone (ROCEPHIN) 2 g in sodium chloride 0.9 % 100 mL IVPB (has no administration in time range)  azithromycin (ZITHROMAX) 500 mg in sodium chloride 0.9 % 250 mL IVPB (has no administration in time range)  methylPREDNISolone sodium succinate (SOLU-MEDROL) 125 mg/2 mL injection 125 mg (125 mg Intravenous Given 04/12/22 1652)  magnesium sulfate IVPB 2 g 50 mL (0 g Intravenous  Stopped 04/12/22 1804)     IMPRESSION / MDM / ASSESSMENT AND PLAN / ED COURSE  I reviewed the triage vital signs and the nursing notes.  DDx: COPD, pneumonia, pleural effusion, pulmonary edema, viral illness/COVID/flu  Patient's presentation is most consistent with acute presentation with potential threat to life or bodily function.  Patient presents with hypoxic respiratory failure with clinically apparent COPD exacerbation.  Chest x-ray concerning for pneumonia.  Not septic.  Will give Solu-Medrol and IV magnesium and reassess symptoms.  ----------------------------------------- 6:52 PM on 04/12/2022 ----------------------------------------- Still very wheezy and constricted.  Feels mildly improved but still short of breath.  Still requiring 2 L nasal cannula oxygen to maintain normal oxygen saturation.  Chest x-ray suggestive of pneumonia.  Will start Rocephin and azithromycin and plan to admit for further management.  She is not septic.       FINAL CLINICAL IMPRESSION(S) / ED DIAGNOSES   Final diagnoses:  Acute  respiratory failure with hypoxia (HCC)  COPD exacerbation ([place])  Community acquired pneumonia, unspecified laterality     Rx / DC Orders   ED Discharge Orders     None        Note:  This document was prepared using Dragon voice recognition software and may include unintentional dictation errors.   Carrie Mew, MD 04/12/22 [phone]

## 2022-04-12 NOTE — ED Notes (Signed)
EKG handed to [place], MD

## 2022-04-13 ENCOUNTER — Encounter: Payer: Self-pay | Admitting: Internal Medicine

## 2022-04-13 DIAGNOSIS — J441 Chronic obstructive pulmonary disease with (acute) exacerbation: Secondary | ICD-10-CM | POA: Diagnosis not present

## 2022-04-13 MED ORDER — ENOXAPARIN SODIUM 60 MG/0.6ML IJ SOSY
0.5000 mg/kg | PREFILLED_SYRINGE | INTRAMUSCULAR | Status: DC
  Administered 2022-04-13 – 2022-04-15 (×3): 45 mg via SUBCUTANEOUS
  Filled 2022-04-13 (×3): qty 0.6

## 2022-04-13 MED ORDER — HYDROXYZINE HCL 25 MG PO TABS
25.0000 mg | ORAL_TABLET | Freq: Every day | ORAL | Status: DC
  Administered 2022-04-14 – 2022-04-15 (×2): 25 mg via ORAL
  Filled 2022-04-13 (×3): qty 1

## 2022-04-13 MED ORDER — SODIUM CHLORIDE 0.9 % IV SOLN: INTRAVENOUS | Status: DC | PRN

## 2022-04-13 MED ORDER — VITAMIN D (ERGOCALCIFEROL) 1.25 MG (50000 UNIT) PO CAPS
50000.0000 [IU] | ORAL_CAPSULE | ORAL | Status: DC
  Administered 2022-04-16: 50000 [IU] via ORAL
  Filled 2022-04-13: qty 1

## 2022-04-13 MED ORDER — CLONAZEPAM 0.5 MG PO TABS
0.5000 mg | ORAL_TABLET | Freq: Three times a day (TID) | ORAL | Status: DC | PRN
  Administered 2022-04-13: 0.5 mg via ORAL
  Administered 2022-04-13: 1 mg via ORAL
  Administered 2022-04-14: 0.5 mg via ORAL
  Administered 2022-04-14 – 2022-04-16 (×4): 1 mg via ORAL
  Filled 2022-04-13 (×4): qty 2
  Filled 2022-04-13 (×2): qty 1
  Filled 2022-04-13: qty 2

## 2022-04-13 NOTE — Progress Notes (Signed)
PROGRESS NOTE    [name]   [id number] DOB: [birth year]  DOA: 04/12/2022 Date of Service: 04/13/22 PCP: Jearld Fenton, NP     Brief Narrative / Hospital Course:  [name] is a 72 y.o. female with medical history significant for , HTN, hypothyroidism, , anxiety and depression, who presented to the ED 04/12/2022 with shortness of breath which has been worsening progressively over the past 3 days. She presented by EMS who recorded an SpO2 of 79% on room air improving to 100% after treatments administered and route.  03/07: O2 sat 88% requiring 2 to 3 L to maintain sats in the mid 90s. Respiratory viral panel negative. Chest x-ray showed ill-defined heterogeneous bibasilar opacities may be atelectasis or pneumonia and chronic bronchial thickening. DuoNeb, methylprednisolone and magnesium sulfate. Started on Rocephin and azithromycin. Admission for COPD, CAP and acute respiratory failure.  03/08: remains on 2L [place]  Consultants:  none  Procedures: none      ASSESSMENT & PLAN:   Principal Problem:   COPD with acute exacerbation ([place]) Active Problems:   CAP (community acquired pneumonia)   Acute respiratory failure with hypoxia (H[facility])   Anxiety and depression   Benign essential HTN   Hypothyroidism   COPD with acute exacerbation ([place]) CAP, sepsis RULED OUT  Acute respiratory failure with hypoxia Scheduled and as needed nebulized bronchodilator treatment Systemic steroids Rocephin and azithromycin Supplemental O2 to keep sats over 92%   Hypothyroidism Continue levothyroxine   Benign essential HTN Continue lisinopril carvedilol and diltiazem   Anxiety and depression Continue citalopram and buspirone, clonazepam     DVT prophylaxis: lovenox Pertinent IV fluids/nutrition: no conitnuous IV fluids  Central lines / invasive devices: none  Code Status: FULL CODE   Current Admission Status: inpatient   TOC needs / Dispo plan: anticipate d/c home  Barriers to  discharge / significant pending items: clinical improvement, wean O2             Subjective / Brief ROS:  Patient reports SOB, coughing Denies CP Pain controlled.  Denies new weakness.  Tolerating diet.  Reports no concerns w/ urination/defecation.   Family Communication: none at this time     Objective Findings:  Vitals:   04/12/22 2119 04/13/22 0131 04/13/22 0447 04/13/22 0756  BP: 121/64  (!) 158/82 (!) 158/70  Pulse: 66  80 76  Resp: 20   16  Temp: 97.8 F (36.6 C)  97.8 F (36.6 C) 97.9 F (36.6 C)  TempSrc: Oral  Oral Oral  SpO2: 93% 90% 96% 94%  Weight:      Height:        Intake/Output Summary (Last 24 hours) at 04/13/2022 1227 Last data filed at 04/13/2022 1025 Gross per 24 hour  Intake 240 ml  Output --  Net 240 ml   Filed Weights   04/12/22 1744  Weight: 91.5 kg    Examination:  Physical Exam       Scheduled Medications:   aspirin EC  81 mg Oral Daily   busPIRone  7.5 mg Oral TID   carvedilol  3.125 mg Oral BID WC   citalopram  40 mg Oral Daily   diltiazem  120 mg Oral Daily   enoxaparin (LOVENOX) injection  40 mg Subcutaneous Q24H   fenofibrate  160 mg Oral Daily   hydrOXYzine  25 mg Oral QHS   ipratropium-albuterol  3 mL Nebulization Q6H   levothyroxine  175 mcg Oral Q0600   lisinopril  10 mg  Oral Daily   [START ON 04/14/2022] predniSONE  40 mg Oral Q breakfast   rosuvastatin  10 mg Oral Daily   [START ON 04/16/2022] Vitamin D (Ergocalciferol)  50,000 Units Oral Once per day on Mon Thu    Continuous Infusions:  azithromycin     cefTRIAXone (ROCEPHIN)  IV      PRN Medications:  acetaminophen **OR** acetaminophen, albuterol, benzonatate, clonazePAM, ondansetron **OR** ondansetron (ZOFRAN) IV  Antimicrobials from admission:  Anti-infectives (From admission, onward)    Start     Dose/Rate Route Frequency Ordered Stop   04/13/22 2200  cefTRIAXone (ROCEPHIN) 2 g in sodium chloride 0.9 % 100 mL IVPB        2 g 200 mL/hr over  30 Minutes Intravenous Every 24 hours 04/12/22 2001 04/17/22 2159   04/13/22 2200  azithromycin (ZITHROMAX) 500 mg in sodium chloride 0.9 % 250 mL IVPB        500 mg 250 mL/hr over 60 Minutes Intravenous Every 24 hours 04/12/22 2001 04/17/22 2159   04/12/22 1900  cefTRIAXone (ROCEPHIN) 2 g in sodium chloride 0.9 % 100 mL IVPB        2 g 200 mL/hr over 30 Minutes Intravenous  Once 04/12/22 1851 04/12/22 1929   04/12/22 1900  azithromycin (ZITHROMAX) 500 mg in sodium chloride 0.9 % 250 mL IVPB        500 mg 250 mL/hr over 60 Minutes Intravenous  Once 04/12/22 1851 04/12/22 2040           Data Reviewed:  I have personally reviewed the following...  CBC: Recent Labs  Lab 04/12/22 1630  WBC 10.9*  NEUTROABS 6.9  HGB 15.8*  HCT 50.5*  MCV 94.4  PLT [id number]   Basic Metabolic Panel: Recent Labs  Lab 04/12/22 1730  NA 134*  K 3.9  CL 93*  CO2 34*  GLUCOSE 151*  BUN 31*  CREATININE 0.73  CALCIUM 8.6*   GFR: Estimated Creatinine Clearance: 67.2 mL/min (by C-G formula based on SCr of 0.73 mg/dL). Liver Function Tests: No results for input(s): "AST", "ALT", "ALKPHOS", "BILITOT", "PROT", "ALBUMIN" in the last 168 hours. No results for input(s): "LIPASE", "AMYLASE" in the last 168 hours. No results for input(s): "AMMONIA" in the last 168 hours. Coagulation Profile: No results for input(s): "INR", "PROTIME" in the last 168 hours. Cardiac Enzymes: No results for input(s): "CKTOTAL", "CKMB", "CKMBINDEX", "TROPONINI" in the last 168 hours. BNP (last 3 results) No results for input(s): "PROBNP" in the last 8760 hours. HbA1C: No results for input(s): "HGBA1C" in the last 72 hours. CBG: No results for input(s): "GLUCAP" in the last 168 hours. Lipid Profile: No results for input(s): "CHOL", "HDL", "LDLCALC", "TRIG", "CHOLHDL", "LDLDIRECT" in the last 72 hours. Thyroid Function Tests: No results for input(s): "TSH", "T4TOTAL", "FREET4", "T3FREE", "THYROIDAB" in the last 72  hours. Anemia Panel: No results for input(s): "VITAMINB12", "FOLATE", "FERRITIN", "TIBC", "IRON", "RETICCTPCT" in the last 72 hours. Most Recent Urinalysis On File:     Component Value Date/Time   COLORURINE YELLOW (A) 12/30/2017 0649   APPEARANCEUR CLEAR (A) 12/30/2017 0649   APPEARANCEUR Clear 07/26/2016 1445   LABSPEC 1.013 12/30/2017 0649   LABSPEC 1.012 10/10/2011 1219   PHURINE 6.0 12/30/2017 [place] 12/30/2017 0649   GLUCOSEU Negative 10/10/2011 1219   HGBUR NEGATIVE 12/30/2017 0649   BILIRUBINUR negative 07/10/2019 0934   BILIRUBINUR Negative 07/26/2016 1445   BILIRUBINUR Negative 10/10/2011 [place] 12/30/2017 0649   PROTEINUR Negative 07/10/2019  [place] 12/30/2017 0649   UROBILINOGEN 0.2 07/10/2019 0934   NITRITE negative 07/10/2019 0934   NITRITE NEGATIVE 12/30/2017 [id number]   LEUKOCYTESUR Small (1+) (A) 07/10/2019 0934   LEUKOCYTESUR 3+ (A) 07/26/2016 1445   LEUKOCYTESUR 2+ 10/10/2011 1219   Sepsis Labs: '@LABRCNTIP'$ (procalcitonin:4,lacticidven:4) Microbiology: Recent Results (from the past 240 hour(s))  Resp panel by RT-PCR (RSV, Flu A&B, Covid) Anterior Nasal Swab     Status: None   Collection Time: 04/12/22  5:01 PM   Specimen: Anterior Nasal Swab  Result Value Ref Range Status   SARS Coronavirus 2 by RT PCR NEGATIVE NEGATIVE Final    Comment: (NOTE) SARS-CoV-2 target nucleic acids are NOT DETECTED.  The SARS-CoV-2 RNA is generally detectable in upper respiratory specimens during the acute phase of infection. The lowest concentration of SARS-CoV-2 viral copies this assay can detect is 138 copies/mL. A negative result does not preclude SARS-Cov-2 infection and should not be used as the sole basis for treatment or other patient management decisions. A negative result may occur with  improper specimen collection/handling, submission of specimen other than nasopharyngeal swab, presence of viral mutation(s) within  the areas targeted by this assay, and inadequate number of viral copies(<138 copies/mL). A negative result must be combined with clinical observations, patient history, and epidemiological information. The expected result is Negative.  Fact Sheet for Patients:  EntrepreneurPulse.com.au  Fact Sheet for Healthcare Providers:  IncredibleEmployment.be  This test is no t yet approved or cleared by the [place] FDA and  has been authorized for detection and/or diagnosis of SARS-CoV-2 by FDA under an Emergency Use Authorization (EUA). This EUA will remain  in effect (meaning this test can be used) for the duration of the COVID-19 declaration under Section 564(b)(1) of the Act, 21 U.S.C.section 360bbb-3(b)(1), unless the authorization is terminated  or revoked sooner.       Influenza A by PCR NEGATIVE NEGATIVE Final   Influenza B by PCR NEGATIVE NEGATIVE Final    Comment: (NOTE) The Xpert Xpress SARS-CoV-2/FLU/RSV plus assay is intended as an aid in the diagnosis of influenza from Nasopharyngeal swab specimens and should not be used as a sole basis for treatment. Nasal washings and aspirates are unacceptable for Xpert Xpress SARS-CoV-2/FLU/RSV testing.  Fact Sheet for Patients: EntrepreneurPulse.com.au  Fact Sheet for Healthcare Providers: IncredibleEmployment.be  This test is not yet approved or cleared by the [place] FDA and has been authorized for detection and/or diagnosis of SARS-CoV-2 by FDA under an Emergency Use Authorization (EUA). This EUA will remain in effect (meaning this test can be used) for the duration of the COVID-19 declaration under Section 564(b)(1) of the Act, 21 U.S.C. section 360bbb-3(b)(1), unless the authorization is terminated or revoked.     Resp Syncytial Virus by PCR NEGATIVE NEGATIVE Final    Comment: (NOTE) Fact Sheet for  Patients: EntrepreneurPulse.com.au  Fact Sheet for Healthcare Providers: IncredibleEmployment.be  This test is not yet approved or cleared by the [place] FDA and has been authorized for detection and/or diagnosis of SARS-CoV-2 by FDA under an Emergency Use Authorization (EUA). This EUA will remain in effect (meaning this test can be used) for the duration of the COVID-19 declaration under Section 564(b)(1) of the Act, 21 U.S.C. section 360bbb-3(b)(1), unless the authorization is terminated or revoked.  Performed at [facility], [address], [place], [place] [address]       Radiology Studies last 3 days: DG Chest Portable 1 View  Result Date: 04/12/2022 CLINICAL DATA:  Shortness of breath. EXAM: PORTABLE CHEST 1 VIEW COMPARISON:  12/06/2020, CT 12/07/2020 FINDINGS: Normal heart size with stable mediastinal contours. Aortic atherosclerosis. Chronic peribronchial thickening. Ill-defined heterogeneous bibasilar opacities. No pneumothorax or large pleural effusion. No acute osseous abnormalities on this portable view. IMPRESSION: 1. Ill-defined heterogeneous bibasilar opacities may be atelectasis or pneumonia. 2. Chronic bronchial thickening. Electronically Signed   By: Keith Rake M.D.   On: 04/12/2022 16:45             LOS: 1 day      Emeterio Reeve, DO [facility] 04/13/2022, 12:27 PM    Dictation software may have been used to generate the above note. Typos may occur and escape review in typed/dictated notes. Please contact Dr Sheppard Coil directly for clarity if needed.  Staff may message me via secure chat in [place]  but this may not receive an immediate response,  please page me for urgent matters!  If 7PM-7AM, please contact night coverage [email or web address]

## 2022-04-13 NOTE — Hospital Course (Addendum)
[name] is a 72 y.o. female with medical history significant for , HTN, hypothyroidism, , anxiety and depression, who presented to the ED 04/12/2022 with shortness of breath which has been worsening progressively over the past 3 days. She presented by EMS who recorded an SpO2 of 79% on room air improving to 100% after treatments administered and route.  03/07: O2 sat 88% requiring 2 to 3 L to maintain sats in the mid 90s. Respiratory viral panel negative. Chest x-ray showed ill-defined heterogeneous bibasilar opacities may be atelectasis or pneumonia and chronic bronchial thickening. DuoNeb, methylprednisolone and magnesium sulfate. Started on Rocephin and azithromycin. Admission for COPD, CAP and acute respiratory failure.  03/08: remains on 2L [place]. Reports SOB. DIffuse coarse breath sounds and wheezing.  03/09: lung sounds somewhat improved, down to 1L O2 at rest. Trial ambulation today.  03/10: lung sounds improvement but significant desaturation on ambulation, will trial another walk test tomorrow   Consultants:  none  Procedures: none      ASSESSMENT & PLAN:   Principal Problem:   COPD with acute exacerbation ([place]) Active Problems:   CAP (community acquired pneumonia)   Acute respiratory failure with hypoxia ([place])   Anxiety and depression   Benign essential HTN   Hypothyroidism   COPD with acute exacerbation ([place]) CAP, sepsis RULED OUT  Acute respiratory failure with hypoxia Scheduled and as needed nebulized bronchodilator treatment Systemic steroids Rocephin and azithromycin Supplemental O2 to keep sats over 92% Trial ambulating tomorrow - if doing well without O2 anticipate discharge hom, if needing O2 on ambulation can get set up w/ home O2, if severe SOB/desat will hold off on discharge     Hypothyroidism Continue levothyroxine   Benign essential HTN Continue lisinopril carvedilol and diltiazem   Anxiety and depression Continue citalopram and buspirone,  clonazepam     DVT prophylaxis: lovenox Pertinent IV fluids/nutrition: no conitnuous IV fluids  Central lines / invasive devices: none  Code Status: FULL CODE   Current Admission Status: inpatient   TOC needs / Dispo plan: anticipate d/c home  Barriers to discharge / significant pending items: clinical improvement, wean O2

## 2022-04-13 NOTE — Progress Notes (Signed)
Anticoagulation monitoring(Lovenox):  72yo  F ordered Lovenox 40 mg Q24h    Filed Weights   04/12/22 1744  Weight: 91.5 kg (201 lb 12.8 oz)   BMI 35.7   Lab Results  Component Value Date   CREATININE 0.73 04/12/2022   CREATININE 0.66 03/05/2022   CREATININE 0.91 07/25/2021   Estimated Creatinine Clearance: 67.2 mL/min (by C-G formula based on SCr of 0.73 mg/dL). Hemoglobin & Hematocrit     Component Value Date/Time   HGB 15.8 (H) 04/12/2022 1630   HGB 15.6 06/24/2015 1102   HCT 50.5 (H) 04/12/2022 1630   HCT 46.6 06/24/2015 1102     Per Protocol for Patient with estCrcl > 30 ml/min and BMI > 30, will transition to Lovenox 0.5 mg/kg Q24h       Chinita Greenland PharmD Clinical Pharmacist 04/13/2022

## 2022-04-14 DIAGNOSIS — J441 Chronic obstructive pulmonary disease with (acute) exacerbation: Secondary | ICD-10-CM | POA: Diagnosis not present

## 2022-04-14 MED ORDER — IPRATROPIUM-ALBUTEROL 0.5-2.5 (3) MG/3ML IN SOLN
3.0000 mL | Freq: Three times a day (TID) | RESPIRATORY_TRACT | Status: DC
  Administered 2022-04-14 – 2022-04-16 (×7): 3 mL via RESPIRATORY_TRACT
  Filled 2022-04-14 (×7): qty 3

## 2022-04-14 MED ORDER — AZITHROMYCIN 250 MG PO TABS
500.0000 mg | ORAL_TABLET | Freq: Every day | ORAL | Status: DC
  Administered 2022-04-14 – 2022-04-15 (×2): 500 mg via ORAL
  Filled 2022-04-14 (×2): qty 2

## 2022-04-14 NOTE — Plan of Care (Signed)

## 2022-04-14 NOTE — Progress Notes (Signed)
PHARMACIST - PHYSICIAN COMMUNICATION  CONCERNING: Antibiotic IV to Oral Route Change Policy  RECOMMENDATION: This patient is receiving azithromycin by the intravenous route.  Based on criteria approved by the Pharmacy and Therapeutics Committee, the antibiotic(s) is/are being converted to the equivalent oral dose form(s).   DESCRIPTION: These criteria include: Patient being treated for a respiratory tract infection, urinary tract infection, cellulitis or clostridium difficile associated diarrhea if on metronidazole The patient is not neutropenic and does not exhibit a GI malabsorption state The patient is eating (either orally or via tube) and/or has been taking other orally administered medications for a least 24 hours The patient is improving clinically and has a Tmax < 100.5  If you have questions about this conversion, please contact the [place], PharmD, BCPS Clinical Pharmacist 04/14/2022 10:55 AM

## 2022-04-14 NOTE — Progress Notes (Signed)
PROGRESS NOTE    [name]   [id number] DOB: [birth year]  DOA: 04/12/2022 Date of Service: 04/14/22 PCP: Jearld Fenton, NP     Brief Narrative / Hospital Course:   [name] is a 72 y.o. female with medical history significant for , HTN, hypothyroidism, , anxiety and depression, who presented to the ED 04/12/2022 with shortness of breath which has been worsening progressively over the past 3 days. She presented by EMS who recorded an SpO2 of 79% on room air improving to 100% after treatments administered and route.  03/07: O2 sat 88% requiring 2 to 3 L to maintain sats in the mid 90s. Respiratory viral panel negative. Chest x-ray showed ill-defined heterogeneous bibasilar opacities may be atelectasis or pneumonia and chronic bronchial thickening. DuoNeb, methylprednisolone and magnesium sulfate. Started on Rocephin and azithromycin. Admission for COPD, CAP and acute respiratory failure.  03/08: remains on 2L [place]. Reports SOB. DIffuse coarse breath sounds and wheezing.  03/09: lung sounds somewhat improved, down to 1L O2 at rest. Trial ambulation today.   Consultants:  none  Procedures: none      ASSESSMENT & PLAN:   Principal Problem:   COPD with acute exacerbation ([place]) Active Problems:   CAP (community acquired pneumonia)   Acute respiratory failure with hypoxia (H[facility])   Anxiety and depression   Benign essential HTN   Hypothyroidism   COPD with acute exacerbation ([place]) CAP, sepsis RULED OUT  Acute respiratory failure with hypoxia Scheduled and as needed nebulized bronchodilator treatment Systemic steroids Rocephin and azithromycin Supplemental O2 to keep sats over 92% Trial ambulating today    Hypothyroidism Continue levothyroxine   Benign essential HTN Continue lisinopril carvedilol and diltiazem   Anxiety and depression Continue citalopram and buspirone, clonazepam     DVT prophylaxis: lovenox Pertinent IV fluids/nutrition: no conitnuous IV  fluids  Central lines / invasive devices: none  Code Status: FULL CODE   Current Admission Status: inpatient   TOC needs / Dispo plan: anticipate d/c home  Barriers to discharge / significant pending items: clinical improvement, wean O2             Subjective / Brief ROS:  Patient reports SOB, coughing are a bit better compared to yesterday  Denies CP Pain controlled.  Denies new weakness.  Tolerating diet.  Reports no concerns w/ urination/defecation.   Family Communication: none at this time     Objective Findings:  Vitals:   04/14/22 0203 04/14/22 0458 04/14/22 0730 04/14/22 0848  BP:  (!) 150/67  (!) 133/59  Pulse:  70 66 69  Resp:  '16 18 18  '$ Temp:  97.9 F (36.6 C)  98.3 F (36.8 C)  TempSrc:  Oral    SpO2: 93% 96% 95% 95%  Weight:      Height:        Intake/Output Summary (Last 24 hours) at 04/14/2022 1244 Last data filed at 04/13/2022 1451 Gross per 24 hour  Intake 120 ml  Output --  Net 120 ml   Filed Weights   04/12/22 1744  Weight: 91.5 kg    Examination:  Physical Exam Constitutional:      General: She is not in acute distress.    Appearance: She is obese.  Cardiovascular:     Rate and Rhythm: Normal rate and regular rhythm.  Pulmonary:     Effort: Pulmonary effort is normal. No tachypnea or accessory muscle usage.     Breath sounds: Decreased breath sounds, wheezing and rhonchi present.  Skin:    General: Skin is warm and dry.  Neurological:     General: No focal deficit present.     Mental Status: She is alert.  Psychiatric:        Mood and Affect: Mood normal.        Behavior: Behavior normal.          Scheduled Medications:   aspirin EC  81 mg Oral Daily   azithromycin  500 mg Oral Daily   busPIRone  7.5 mg Oral TID   carvedilol  3.125 mg Oral BID WC   citalopram  40 mg Oral Daily   diltiazem  120 mg Oral Daily   enoxaparin (LOVENOX) injection  0.5 mg/kg Subcutaneous Q24H   fenofibrate  160 mg Oral Daily    hydrOXYzine  25 mg Oral QHS   ipratropium-albuterol  3 mL Nebulization TID   levothyroxine  175 mcg Oral Q0600   lisinopril  10 mg Oral Daily   predniSONE  40 mg Oral Q breakfast   rosuvastatin  10 mg Oral Daily   [START ON 04/16/2022] Vitamin D (Ergocalciferol)  50,000 Units Oral Once per day on Mon Thu    Continuous Infusions:  sodium chloride 10 mL/hr at 04/13/22 2145   cefTRIAXone (ROCEPHIN)  IV 2 g (04/13/22 2147)    PRN Medications:  sodium chloride, acetaminophen **OR** acetaminophen, albuterol, benzonatate, clonazePAM, ondansetron **OR** ondansetron (ZOFRAN) IV  Antimicrobials from admission:  Anti-infectives (From admission, onward)    Start     Dose/Rate Route Frequency Ordered Stop   04/14/22 2200  azithromycin (ZITHROMAX) tablet 500 mg        500 mg Oral Daily 04/14/22 1054 04/17/22 2159   04/13/22 2200  cefTRIAXone (ROCEPHIN) 2 g in sodium chloride 0.9 % 100 mL IVPB        2 g 200 mL/hr over 30 Minutes Intravenous Every 24 hours 04/12/22 2001 04/17/22 2159   04/13/22 2200  azithromycin (ZITHROMAX) 500 mg in sodium chloride 0.9 % 250 mL IVPB  Status:  Discontinued        500 mg 250 mL/hr over 60 Minutes Intravenous Every 24 hours 04/12/22 2001 04/14/22 1054   04/12/22 1900  cefTRIAXone (ROCEPHIN) 2 g in sodium chloride 0.9 % 100 mL IVPB        2 g 200 mL/hr over 30 Minutes Intravenous  Once 04/12/22 1851 04/12/22 1929   04/12/22 1900  azithromycin (ZITHROMAX) 500 mg in sodium chloride 0.9 % 250 mL IVPB        500 mg 250 mL/hr over 60 Minutes Intravenous  Once 04/12/22 1851 04/12/22 2040           Data Reviewed:  I have personally reviewed the following...  CBC: Recent Labs  Lab 04/12/22 1630  WBC 10.9*  NEUTROABS 6.9  HGB 15.8*  HCT 50.5*  MCV 94.4  PLT [id number]   Basic Metabolic Panel: Recent Labs  Lab 04/12/22 1730  NA 134*  K 3.9  CL 93*  CO2 34*  GLUCOSE 151*  BUN 31*  CREATININE 0.73  CALCIUM 8.6*   GFR: Estimated Creatinine Clearance:  67.2 mL/min (by C-G formula based on SCr of 0.73 mg/dL). Liver Function Tests: No results for input(s): "AST", "ALT", "ALKPHOS", "BILITOT", "PROT", "ALBUMIN" in the last 168 hours. No results for input(s): "LIPASE", "AMYLASE" in the last 168 hours. No results for input(s): "AMMONIA" in the last 168 hours. Coagulation Profile: No results for input(s): "INR", "PROTIME" in the last 168 hours. Cardiac Enzymes:  No results for input(s): "CKTOTAL", "CKMB", "CKMBINDEX", "TROPONINI" in the last 168 hours. BNP (last 3 results) No results for input(s): "PROBNP" in the last 8760 hours. HbA1C: No results for input(s): "HGBA1C" in the last 72 hours. CBG: No results for input(s): "GLUCAP" in the last 168 hours. Lipid Profile: No results for input(s): "CHOL", "HDL", "LDLCALC", "TRIG", "CHOLHDL", "LDLDIRECT" in the last 72 hours. Thyroid Function Tests: No results for input(s): "TSH", "T4TOTAL", "FREET4", "T3FREE", "THYROIDAB" in the last 72 hours. Anemia Panel: No results for input(s): "VITAMINB12", "FOLATE", "FERRITIN", "TIBC", "IRON", "RETICCTPCT" in the last 72 hours. Most Recent Urinalysis On File:   Sepsis Labs: '@LABRCNTIP'$ (procalcitonin:4,lacticidven:4) Microbiology: Recent Results (from the past 240 hour(s))  Resp panel by RT-PCR (RSV, Flu A&B, Covid) Anterior Nasal Swab     Status: None   Collection Time: 04/12/22  5:01 PM   Specimen: Anterior Nasal Swab  Result Value Ref Range Status   SARS Coronavirus 2 by RT PCR NEGATIVE NEGATIVE Final    Comment: (NOTE) SARS-CoV-2 target nucleic acids are NOT DETECTED.  The SARS-CoV-2 RNA is generally detectable in upper respiratory specimens during the acute phase of infection. The lowest concentration of SARS-CoV-2 viral copies this assay can detect is 138 copies/mL. A negative result does not preclude SARS-Cov-2 infection and should not be used as the sole basis for treatment or other patient management decisions. A negative result may occur  with  improper specimen collection/handling, submission of specimen other than nasopharyngeal swab, presence of viral mutation(s) within the areas targeted by this assay, and inadequate number of viral copies(<138 copies/mL). A negative result must be combined with clinical observations, patient history, and epidemiological information. The expected result is Negative.  Fact Sheet for Patients:  EntrepreneurPulse.com.au  Fact Sheet for Healthcare Providers:  IncredibleEmployment.be  This test is no t yet approved or cleared by the [place] FDA and  has been authorized for detection and/or diagnosis of SARS-CoV-2 by FDA under an Emergency Use Authorization (EUA). This EUA will remain  in effect (meaning this test can be used) for the duration of the COVID-19 declaration under Section 564(b)(1) of the Act, 21 U.S.C.section 360bbb-3(b)(1), unless the authorization is terminated  or revoked sooner.       Influenza A by PCR NEGATIVE NEGATIVE Final   Influenza B by PCR NEGATIVE NEGATIVE Final    Comment: (NOTE) The Xpert Xpress SARS-CoV-2/FLU/RSV plus assay is intended as an aid in the diagnosis of influenza from Nasopharyngeal swab specimens and should not be used as a sole basis for treatment. Nasal washings and aspirates are unacceptable for Xpert Xpress SARS-CoV-2/FLU/RSV testing.  Fact Sheet for Patients: EntrepreneurPulse.com.au  Fact Sheet for Healthcare Providers: IncredibleEmployment.be  This test is not yet approved or cleared by the [place] FDA and has been authorized for detection and/or diagnosis of SARS-CoV-2 by FDA under an Emergency Use Authorization (EUA). This EUA will remain in effect (meaning this test can be used) for the duration of the COVID-19 declaration under Section 564(b)(1) of the Act, 21 U.S.C. section 360bbb-3(b)(1), unless the authorization is terminated  or revoked.     Resp Syncytial Virus by PCR NEGATIVE NEGATIVE Final    Comment: (NOTE) Fact Sheet for Patients: EntrepreneurPulse.com.au  Fact Sheet for Healthcare Providers: IncredibleEmployment.be  This test is not yet approved or cleared by the [place] FDA and has been authorized for detection and/or diagnosis of SARS-CoV-2 by FDA under an Emergency Use Authorization (EUA). This EUA will remain in effect (meaning this test can be used)  for the duration of the COVID-19 declaration under Section 564(b)(1) of the Act, 21 U.S.C. section 360bbb-3(b)(1), unless the authorization is terminated or revoked.  Performed at [facility], [address], [place], [place] [address]       Radiology Studies last 3 days: DG Chest Portable 1 View  Result Date: 04/12/2022 CLINICAL DATA:  Shortness of breath. EXAM: PORTABLE CHEST 1 VIEW COMPARISON:  12/06/2020, CT 12/07/2020 FINDINGS: Normal heart size with stable mediastinal contours. Aortic atherosclerosis. Chronic peribronchial thickening. Ill-defined heterogeneous bibasilar opacities. No pneumothorax or large pleural effusion. No acute osseous abnormalities on this portable view. IMPRESSION: 1. Ill-defined heterogeneous bibasilar opacities may be atelectasis or pneumonia. 2. Chronic bronchial thickening. Electronically Signed   By: Keith Rake M.D.   On: 04/12/2022 16:45             LOS: 2 days      Emeterio Reeve, DO [facility] 04/14/2022, 12:44 PM    Dictation software may have been used to generate the above note. Typos may occur and escape review in typed/dictated notes. Please contact Dr Sheppard Coil directly for clarity if needed.  Staff may message me via secure chat in [place]  but this may not receive an immediate response,  please page me for urgent matters!  If 7PM-7AM, please contact night coverage [email or web address]

## 2022-04-15 DIAGNOSIS — J441 Chronic obstructive pulmonary disease with (acute) exacerbation: Secondary | ICD-10-CM | POA: Diagnosis not present

## 2022-04-15 LAB — CBC
HCT: 49.4 % — ABNORMAL HIGH (ref 36.0–46.0)
Hemoglobin: 15.1 g/dL — ABNORMAL HIGH (ref 12.0–15.0)
MCH: 29.3 pg (ref 26.0–34.0)
MCHC: 30.6 g/dL (ref 30.0–36.0)
MCV: 95.7 fL (ref 80.0–100.0)
Platelets: 219 10*3/uL (ref 150–400)
RBC: 5.16 MIL/uL — ABNORMAL HIGH (ref 3.87–5.11)
RDW: 11.9 % (ref 11.5–15.5)
WBC: 11.9 10*3/uL — ABNORMAL HIGH (ref 4.0–10.5)
nRBC: 0 % (ref 0.0–0.2)

## 2022-04-15 LAB — BASIC METABOLIC PANEL
Anion gap: 6 (ref 5–15)
BUN: 33 mg/dL — ABNORMAL HIGH (ref 8–23)
CO2: 36 mmol/L — ABNORMAL HIGH (ref 22–32)
Calcium: 8.8 mg/dL — ABNORMAL LOW (ref 8.9–10.3)
Chloride: 96 mmol/L — ABNORMAL LOW (ref 98–111)
Creatinine, Ser: 0.8 mg/dL (ref 0.44–1.00)
GFR, Estimated: >60 mL/min (ref >60)
Glucose, Bld: 134 mg/dL — ABNORMAL HIGH (ref 70–99)
Potassium: 4.3 mmol/L (ref 3.5–5.1)
Sodium: 138 mmol/L (ref 135–145)

## 2022-04-15 NOTE — Progress Notes (Signed)
PROGRESS NOTE    [name]   [id number] DOB: [birth year]  DOA: 04/12/2022 Date of Service: 04/15/22 PCP: Jearld Fenton, NP     Brief Narrative / Hospital Course:   [name] is a 72 y.o. female with medical history significant for , HTN, hypothyroidism, , anxiety and depression, who presented to the ED 04/12/2022 with shortness of breath which has been worsening progressively over the past 3 days. She presented by EMS who recorded an SpO2 of 79% on room air improving to 100% after treatments administered and route.  03/07: O2 sat 88% requiring 2 to 3 L to maintain sats in the mid 90s. Respiratory viral panel negative. Chest x-ray showed ill-defined heterogeneous bibasilar opacities may be atelectasis or pneumonia and chronic bronchial thickening. DuoNeb, methylprednisolone and magnesium sulfate. Started on Rocephin and azithromycin. Admission for COPD, CAP and acute respiratory failure.  03/08: remains on 2L [place]. Reports SOB. DIffuse coarse breath sounds and wheezing.  03/09: lung sounds somewhat improved, down to 1L O2 at rest. Trial ambulation today.  03/10: lung sounds improvement but significant desaturation on ambulation, will trial another walk test tomorrow   Consultants:  none  Procedures: none      ASSESSMENT & PLAN:   Principal Problem:   COPD with acute exacerbation ([place]) Active Problems:   CAP (community acquired pneumonia)   Acute respiratory failure with hypoxia ([place])   Anxiety and depression   Benign essential HTN   Hypothyroidism   COPD with acute exacerbation ([place]) CAP, sepsis RULED OUT  Acute respiratory failure with hypoxia Scheduled and as needed nebulized bronchodilator treatment Systemic steroids Rocephin and azithromycin Supplemental O2 to keep sats over 92% Trial ambulating tomorrow - if doing well without O2 anticipate discharge hom, if needing O2 on ambulation can get set up w/ home O2, if severe SOB/desat will hold off on discharge      Hypothyroidism Continue levothyroxine   Benign essential HTN Continue lisinopril carvedilol and diltiazem   Anxiety and depression Continue citalopram and buspirone, clonazepam     DVT prophylaxis: lovenox Pertinent IV fluids/nutrition: no conitnuous IV fluids  Central lines / invasive devices: none  Code Status: FULL CODE   Current Admission Status: inpatient   TOC needs / Dispo plan: anticipate d/c home  Barriers to discharge / significant pending items: clinical improvement, wean O2             Subjective / Brief ROS:  Patient reports SOB is better today   Denies CP Pain controlled.  Denies new weakness.  Tolerating diet.  Reports no concerns w/ urination/defecation.   Family Communication: none at this time     Objective Findings:  Vitals:   04/15/22 1152 04/15/22 1153 04/15/22 1155 04/15/22 1457  BP:      Pulse:    66  Resp:    18  Temp:      TempSrc:      SpO2: (!) 86% 90% 94% 93%  Weight:      Height:        Intake/Output Summary (Last 24 hours) at 04/15/2022 1544 Last data filed at 04/15/2022 1421 Gross per 24 hour  Intake 582.79 ml  Output 0 ml  Net 582.79 ml   Filed Weights   04/12/22 1744  Weight: 91.5 kg    Examination:  Physical Exam Constitutional:      General: She is not in acute distress.    Appearance: She is obese.  Cardiovascular:     Rate and  Rhythm: Normal rate and regular rhythm.  Pulmonary:     Effort: Pulmonary effort is normal. No tachypnea or accessory muscle usage.     Breath sounds: Decreased breath sounds and wheezing present. No rhonchi.  Skin:    General: Skin is warm and dry.  Neurological:     General: No focal deficit present.     Mental Status: She is alert.  Psychiatric:        Mood and Affect: Mood normal.        Behavior: Behavior normal.          Scheduled Medications:   aspirin EC  81 mg Oral Daily   azithromycin  500 mg Oral Daily   busPIRone  7.5 mg Oral TID   carvedilol   3.125 mg Oral BID WC   citalopram  40 mg Oral Daily   diltiazem  120 mg Oral Daily   enoxaparin (LOVENOX) injection  0.5 mg/kg Subcutaneous Q24H   fenofibrate  160 mg Oral Daily   hydrOXYzine  25 mg Oral QHS   ipratropium-albuterol  3 mL Nebulization TID   levothyroxine  175 mcg Oral Q0600   lisinopril  10 mg Oral Daily   predniSONE  40 mg Oral Q breakfast   rosuvastatin  10 mg Oral Daily   [START ON 04/16/2022] Vitamin D (Ergocalciferol)  50,000 Units Oral Once per day on Mon Thu    Continuous Infusions:  sodium chloride Stopped (04/13/22 2330)   cefTRIAXone (ROCEPHIN)  IV Stopped (04/14/22 2152)    PRN Medications:  sodium chloride, acetaminophen **OR** acetaminophen, albuterol, benzonatate, clonazePAM, ondansetron **OR** ondansetron (ZOFRAN) IV  Antimicrobials from admission:  Anti-infectives (From admission, onward)    Start     Dose/Rate Route Frequency Ordered Stop   04/14/22 2200  azithromycin (ZITHROMAX) tablet 500 mg        500 mg Oral Daily 04/14/22 1054 04/17/22 2159   04/13/22 2200  cefTRIAXone (ROCEPHIN) 2 g in sodium chloride 0.9 % 100 mL IVPB        2 g 200 mL/hr over 30 Minutes Intravenous Every 24 hours 04/12/22 2001 04/17/22 2159   04/13/22 2200  azithromycin (ZITHROMAX) 500 mg in sodium chloride 0.9 % 250 mL IVPB  Status:  Discontinued        500 mg 250 mL/hr over 60 Minutes Intravenous Every 24 hours 04/12/22 2001 04/14/22 1054   04/12/22 1900  cefTRIAXone (ROCEPHIN) 2 g in sodium chloride 0.9 % 100 mL IVPB        2 g 200 mL/hr over 30 Minutes Intravenous  Once 04/12/22 1851 04/12/22 1929   04/12/22 1900  azithromycin (ZITHROMAX) 500 mg in sodium chloride 0.9 % 250 mL IVPB        500 mg 250 mL/hr over 60 Minutes Intravenous  Once 04/12/22 1851 04/12/22 2040           Data Reviewed:  I have personally reviewed the following...  CBC: Recent Labs  Lab 04/12/22 1630 04/15/22 0600  WBC 10.9* 11.9*  NEUTROABS 6.9  --   HGB 15.8* 15.1*  HCT 50.5*  49.4*  MCV 94.4 95.7  PLT 308 [id number]   Basic Metabolic Panel: Recent Labs  Lab 04/12/22 1730 04/15/22 0600  NA 134* 138  K 3.9 4.3  CL 93* 96*  CO2 34* 36*  GLUCOSE 151* 134*  BUN 31* 33*  CREATININE 0.73 0.80  CALCIUM 8.6* 8.8*   GFR: Estimated Creatinine Clearance: 67.2 mL/min (by C-G formula based on SCr of 0.8  mg/dL). Liver Function Tests: No results for input(s): "AST", "ALT", "ALKPHOS", "BILITOT", "PROT", "ALBUMIN" in the last 168 hours. No results for input(s): "LIPASE", "AMYLASE" in the last 168 hours. No results for input(s): "AMMONIA" in the last 168 hours. Coagulation Profile: No results for input(s): "INR", "PROTIME" in the last 168 hours. Cardiac Enzymes: No results for input(s): "CKTOTAL", "CKMB", "CKMBINDEX", "TROPONINI" in the last 168 hours. BNP (last 3 results) No results for input(s): "PROBNP" in the last 8760 hours. HbA1C: No results for input(s): "HGBA1C" in the last 72 hours. CBG: No results for input(s): "GLUCAP" in the last 168 hours. Lipid Profile: No results for input(s): "CHOL", "HDL", "LDLCALC", "TRIG", "CHOLHDL", "LDLDIRECT" in the last 72 hours. Thyroid Function Tests: No results for input(s): "TSH", "T4TOTAL", "FREET4", "T3FREE", "THYROIDAB" in the last 72 hours. Anemia Panel: No results for input(s): "VITAMINB12", "FOLATE", "FERRITIN", "TIBC", "IRON", "RETICCTPCT" in the last 72 hours. Most Recent Urinalysis On File:   Sepsis Labs: '@LABRCNTIP'$ (procalcitonin:4,lacticidven:4) Microbiology: Recent Results (from the past 240 hour(s))  Resp panel by RT-PCR (RSV, Flu A&B, Covid) Anterior Nasal Swab     Status: None   Collection Time: 04/12/22  5:01 PM   Specimen: Anterior Nasal Swab  Result Value Ref Range Status   SARS Coronavirus 2 by RT PCR NEGATIVE NEGATIVE Final    Comment: (NOTE) SARS-CoV-2 target nucleic acids are NOT DETECTED.  The SARS-CoV-2 RNA is generally detectable in upper respiratory specimens during the acute phase of  infection. The lowest concentration of SARS-CoV-2 viral copies this assay can detect is 138 copies/mL. A negative result does not preclude SARS-Cov-2 infection and should not be used as the sole basis for treatment or other patient management decisions. A negative result may occur with  improper specimen collection/handling, submission of specimen other than nasopharyngeal swab, presence of viral mutation(s) within the areas targeted by this assay, and inadequate number of viral copies(<138 copies/mL). A negative result must be combined with clinical observations, patient history, and epidemiological information. The expected result is Negative.  Fact Sheet for Patients:  EntrepreneurPulse.com.au  Fact Sheet for Healthcare Providers:  IncredibleEmployment.be  This test is no t yet approved or cleared by the [place] FDA and  has been authorized for detection and/or diagnosis of SARS-CoV-2 by FDA under an Emergency Use Authorization (EUA). This EUA will remain  in effect (meaning this test can be used) for the duration of the COVID-19 declaration under Section 564(b)(1) of the Act, 21 U.S.C.section 360bbb-3(b)(1), unless the authorization is terminated  or revoked sooner.       Influenza A by PCR NEGATIVE NEGATIVE Final   Influenza B by PCR NEGATIVE NEGATIVE Final    Comment: (NOTE) The Xpert Xpress SARS-CoV-2/FLU/RSV plus assay is intended as an aid in the diagnosis of influenza from Nasopharyngeal swab specimens and should not be used as a sole basis for treatment. Nasal washings and aspirates are unacceptable for Xpert Xpress SARS-CoV-2/FLU/RSV testing.  Fact Sheet for Patients: EntrepreneurPulse.com.au  Fact Sheet for Healthcare Providers: IncredibleEmployment.be  This test is not yet approved or cleared by the [place] FDA and has been authorized for detection and/or diagnosis of SARS-CoV-2  by FDA under an Emergency Use Authorization (EUA). This EUA will remain in effect (meaning this test can be used) for the duration of the COVID-19 declaration under Section 564(b)(1) of the Act, 21 U.S.C. section 360bbb-3(b)(1), unless the authorization is terminated or revoked.     Resp Syncytial Virus by PCR NEGATIVE NEGATIVE Final    Comment: (NOTE) Fact  Sheet for Patients: EntrepreneurPulse.com.au  Fact Sheet for Healthcare Providers: IncredibleEmployment.be  This test is not yet approved or cleared by the [place] FDA and has been authorized for detection and/or diagnosis of SARS-CoV-2 by FDA under an Emergency Use Authorization (EUA). This EUA will remain in effect (meaning this test can be used) for the duration of the COVID-19 declaration under Section 564(b)(1) of the Act, 21 U.S.C. section 360bbb-3(b)(1), unless the authorization is terminated or revoked.  Performed at [facility], [address], [place], [place] [address]       Radiology Studies last 3 days: DG Chest Portable 1 View  Result Date: 04/12/2022 CLINICAL DATA:  Shortness of breath. EXAM: PORTABLE CHEST 1 VIEW COMPARISON:  12/06/2020, CT 12/07/2020 FINDINGS: Normal heart size with stable mediastinal contours. Aortic atherosclerosis. Chronic peribronchial thickening. Ill-defined heterogeneous bibasilar opacities. No pneumothorax or large pleural effusion. No acute osseous abnormalities on this portable view. IMPRESSION: 1. Ill-defined heterogeneous bibasilar opacities may be atelectasis or pneumonia. 2. Chronic bronchial thickening. Electronically Signed   By: Keith Rake M.D.   On: 04/12/2022 16:45             LOS: 3 days      Emeterio Reeve, DO [facility] 04/15/2022, 3:44 PM    Dictation software may have been used to generate the above note. Typos may occur and escape review in typed/dictated notes. Please contact Dr  Sheppard Coil directly for clarity if needed.  Staff may message me via secure chat in [place]  but this may not receive an immediate response,  please page me for urgent matters!  If 7PM-7AM, please contact night coverage [email or web address]

## 2022-04-16 MED ORDER — AZITHROMYCIN 250 MG PO TABS: 250.0000 mg | ORAL_TABLET | Freq: Every day | ORAL | 0 refills | Status: AC

## 2022-04-16 MED ORDER — IPRATROPIUM-ALBUTEROL 0.5-2.5 (3) MG/3ML IN SOLN: 3.0000 mL | Freq: Four times a day (QID) | RESPIRATORY_TRACT | 0 refills | Status: DC | PRN

## 2022-04-16 MED ORDER — PREDNISONE 20 MG PO TABS: 40.0000 mg | ORAL_TABLET | Freq: Every day | ORAL | 0 refills | Status: AC

## 2022-04-16 MED ORDER — ALBUTEROL SULFATE HFA 108 (90 BASE) MCG/ACT IN AERS: INHALATION_SPRAY | RESPIRATORY_TRACT | 2 refills | Status: DC

## 2022-04-16 MED ORDER — FLUTICASONE FUROATE-VILANTEROL 100-25 MCG/ACT IN AEPB: 1.0000 | INHALATION_SPRAY | Freq: Every day | RESPIRATORY_TRACT | 0 refills | Status: DC

## 2022-04-16 MED ORDER — AMOXICILLIN-POT CLAVULANATE 500-125 MG PO TABS: 1.0000 | ORAL_TABLET | Freq: Two times a day (BID) | ORAL | 0 refills | Status: AC

## 2022-04-16 NOTE — Care Management Important Message (Signed)
Important Message  Patient Details  Name: [name] MRN: [id number] Date of Birth: 1950/06/10   Medicare Important Message Given:  Yes     Dannette Barbara 04/16/2022, 10:57 AM

## 2022-04-16 NOTE — TOC Transition Note (Signed)
Transition of Care [facility]) - CM/SW Discharge Note   Patient Details  Name: ANAVEY BRULL MRN: [id number] Date of Birth: 1950-10-17  Transition of Care [facility]) CM/SW Contact:  Beverly Sessions, RN Phone Number: 04/16/2022, 4:47 PM   Clinical Narrative:     Nebulizer ordered Order sent to Cyril Mourning with [employer] Son to pick up at retail store          Patient Goals and CMS Choice      Discharge Placement                         Discharge Plan and Services Additional resources added to the After Visit Summary for                                       Social Determinants of Health (SDOH) Interventions SDOH Screenings   Food Insecurity: No Food Insecurity (04/13/2022)  Housing: Low Risk  (04/12/2022)  Transportation Needs: No Transportation Needs (04/12/2022)  Utilities: Not At Risk (04/12/2022)  Alcohol Screen: Low Risk  (04/20/2021)  Depression (PHQ2-9): Medium Risk (03/05/2022)  Financial Resource Strain: Low Risk  (04/21/2021)  Physical Activity: Insufficiently Active (04/21/2021)  Social Connections: Unknown (04/21/2021)  Stress: No Stress Concern Present (04/21/2021)  Tobacco Use: High Risk (04/13/2022)     Readmission Risk Interventions     No data to display

## 2022-04-16 NOTE — Progress Notes (Signed)
Mobility Specialist - Progress Note   04/16/22 1138  Mobility  Activity Ambulated independently in hallway  Level of Assistance Modified independent, requires aide device or extra time  Assistive Device Front wheel walker  Distance Ambulated (ft) 160 ft  Activity Response Tolerated well  $Mobility charge 1 Mobility   Nurse requested Mobility Specialist to perform oxygen saturation test with pt which includes removing pt from oxygen both at rest and while ambulating.  Below are the results from that testing.     Patient Saturations on Room Air at Rest = spO2 93%  Patient Saturations on Room Air while Ambulating = sp02 86% .  Rested and performed pursed lip breathing for 1 minute with sp02 at 87%.  Patient Saturations on 1 Liter of oxygen while Ambulating = sp02 90%  At end of testing pt left in room on 1 Liter of oxygen.  Reported results to nurse.    Pt supine upon entry, utilizing [place] 1L. MS removed Pt from [place] for O2 sat test, O2 >90% on RA. Pt completed bed mob and donned shoes ModI. Pt STS to RW and amb ModI, extra time needed to complete task. Approximately 20 ft into amb Pt O2 desat to 86% with some SOB present throughout activity. Pt placed on 1L O2 for remainder of amb, O2 >88%. Pt left supine with alarm set and needs within reach. RN notified.   Candie Mile Mobility Specialist 04/16/22 11:46 AM

## 2022-04-16 NOTE — TOC Initial Note (Signed)
Transition of Care [facility]) - Initial/Assessment Note    Patient Details  Name: [name] MRN: [id number] Date of Birth: [birth year]  Transition of Care [facility]) CM/SW Contact:    Beverly Sessions, RN Phone Number: 04/16/2022, 1:59 PM  Clinical Narrative:                  Patient to discharge today with home O2 Referral made to [facility] with [employer] Portable O2 delivered to room Patient and MD confirmed no indication for home health services at discharge  Patient states that her son is to transport       Patient Goals and CMS Choice            Expected Discharge Plan and Services         Expected Discharge Date: 04/16/22                                    Prior Living Arrangements/Services                       Activities of Daily Living Home Assistive Devices/Equipment: Kasandra Knudsen (specify quad or straight), Walker (specify type) ADL Screening (condition at time of admission) Patient's cognitive ability adequate to safely complete daily activities?: Yes Is the patient deaf or have difficulty hearing?: Yes Does the patient have difficulty seeing, even when wearing glasses/contacts?: No Does the patient have difficulty concentrating, remembering, or making decisions?: No Patient able to express need for assistance with ADLs?: Yes Does the patient have difficulty dressing or bathing?: No Independently performs ADLs?: Yes (appropriate for developmental age) Communication: Independent Dressing (OT): Independent Grooming: Independent Feeding: Independent Bathing: Independent Toileting: Independent In/Out Bed: Independent Walks in Home: Independent with device (comment) (cane or walker) Does the patient have difficulty walking or climbing stairs?: Yes Weakness of Legs: Left (bursitis in left hip) Weakness of Arms/Hands: None  Permission Sought/Granted                  Emotional Assessment              Admission diagnosis:  COPD exacerbation  ([place]) [J44.1] Acute respiratory failure with hypoxia ([place]) [J96.01] COPD with acute exacerbation ([place]) [J44.1] Community acquired pneumonia, unspecified laterality [J18.9] Patient Active Problem List   Diagnosis Date Noted   COPD with acute exacerbation ([place]) 04/12/2022   Acute respiratory failure with hypoxia ([place]) 04/12/2022   CAP (community acquired pneumonia) 04/12/2022   Psoriasis 07/26/2020   Class 2 obesity due to excess calories with body mass index (BMI) of 35.0 to 35.9 in adult 07/26/2020   COPD (chronic obstructive pulmonary disease) ([place]) 10/28/2017   Thoracic aortic atherosclerosis ([place]) 02/12/2017   Osteoarthritis 05/07/2016   Insomnia 01/10/2016   Bilateral carotid artery stenosis 08/22/2015   Benign essential HTN 08/04/2015   Mixed hyperlipidemia 08/04/2015   Atherosclerotic peripheral vascular disease with intermittent claudication ([place]) 08/04/2015   Panic disorder without agoraphobia with moderate panic attacks 07/01/2015   DM (diabetes mellitus), type 2 ([place]) 06/09/2015   Anxiety and depression 06/23/2014   Chronic, continuous use of opioids 07/28/2013   Esophageal reflux 07/01/2009   Hypothyroidism 01/11/2009   PCP:  Jearld Fenton, NP Pharmacy:   [facility] [id number] Lorina Rabon, [place] - [address] AT [place] [address] Phone: [phone] Fax: [phone]  Social Determinants of Health (SDOH) Social History: SDOH Screenings   Food Insecurity: No Food Insecurity (04/13/2022)  Housing: Low Risk  (04/12/2022)  Transportation Needs: No Transportation Needs (04/12/2022)  Utilities: Not At Risk (04/12/2022)  Alcohol Screen: Low Risk  (04/20/2021)  Depression (PHQ2-9): Medium Risk (03/05/2022)  Financial Resource Strain: Low Risk  (04/21/2021)  Physical Activity: Insufficiently Active (04/21/2021)  Social Connections: Unknown (04/21/2021)  Stress: No Stress Concern Present (04/21/2021)  Tobacco  Use: High Risk (04/13/2022)   SDOH Interventions:     Readmission Risk Interventions     No data to display

## 2022-04-16 NOTE — Discharge Summary (Signed)
Physician Discharge Summary   Patient: [name] MRN: [id number]  DOB: August 03, 1950   Admit:     Date of Admission: 04/12/2022 Admitted from: home   Discharge: Date of discharge: 04/16/22 Disposition: Home Condition at discharge: good  CODE STATUS: FULL CODE     Discharge Physician: Emeterio Reeve, DO [facility]     PCP: Jearld Fenton, NP  Recommendations for Outpatient Follow-up:  Follow up with PCP Jearld Fenton, NP in 1-2 weeks Please obtain labs/tests: CBC, BMP in 1-2 weeks Please follow up on the following pending results: none Home O2 to maintain or taper off PCP AND OTHER OUTPATIENT PROVIDERS: SEE BELOW FOR SPECIFIC DISCHARGE INSTRUCTIONS PRINTED FOR PATIENT IN ADDITION TO GENERIC AVS PATIENT INFO    Discharge Instructions     Ambulatory Referral for Lung Cancer Scre   Complete by: As directed    Diet - low sodium heart healthy   Complete by: As directed    Increase activity slowly   Complete by: As directed          Discharge Diagnoses: Principal Problem:   COPD with acute exacerbation ([place]) Active Problems:   CAP (community acquired pneumonia)   Acute respiratory failure with hypoxia ([place])   Anxiety and depression   Benign essential HTN   Hypothyroidism       Hospital Course: [name] is a 72 y.o. female with medical history significant for , HTN, hypothyroidism, , anxiety and depression, who presented to the ED 04/12/2022 with shortness of breath which has been worsening progressively over the past 3 days. She presented by EMS who recorded an SpO2 of 79% on room air improving to 100% after treatments administered and route.  03/07: O2 sat 88% requiring 2 to 3 L to maintain sats in the mid 90s. Respiratory viral panel negative. Chest x-ray showed ill-defined heterogeneous bibasilar opacities may be atelectasis or pneumonia and chronic bronchial thickening. DuoNeb, methylprednisolone and magnesium sulfate. Started on Rocephin  and azithromycin. Admission for COPD, CAP and acute respiratory failure.  03/08: remains on 2L [place]. Reports SOB. DIffuse coarse breath sounds and wheezing.  03/09: lung sounds somewhat improved, down to 1L O2 at rest. Trial ambulation today.  03/10: lung sounds improvement but significant desaturation on ambulation, will trial another walk test tomorrow  03/11: saturating better, lung sounds much improved, desat mild on ambulation but improve w/ O2, d/c home on O2 to f/u w/ PCP   Consultants:  none  Procedures: none      ASSESSMENT & PLAN:   COPD with acute exacerbation (HCC) CAP, sepsis RULED OUT  Acute respiratory failure with hypoxia Scheduled and as needed nebulized bronchodilator treatment Systemic steroids --> 1 more day rx sent  Rocephin and azithromycin --> azithro + augmentin to finish 2 more days  Supplemental O2 to keep sats over 92% Follow outpatient to maintain or d/c O2    Hypothyroidism Continue levothyroxine   Benign essential HTN Continue lisinopril carvedilol and diltiazem   Anxiety and depression Continue citalopram and buspirone, clonazepam               Discharge Instructions  Allergies as of 04/16/2022       Reactions   Ciprofloxacin Hives, Diarrhea   Mucinex [guaifenesin Er] Nausea Only   Per pt   Oxycodone Diarrhea   Oxycodone-acetaminophen Diarrhea   Oxycodone-acetaminophen Diarrhea   Sulfa Antibiotics Rash   Throat, mouth Throat, mouth        Medication List  TAKE these medications    acetaminophen 500 MG tablet Commonly known as: TYLENOL Take 500 mg by mouth every 6 (six) hours as needed for moderate pain.   albuterol 108 (90 Base) MCG/ACT inhaler Commonly known as: VENTOLIN HFA INHALE 2 PUFFS BY MOUTH EVERY 6 HOURS AS NEEDED FOR WHEEZING OR SHORTNESS OF BREATH   amoxicillin-clavulanate 500-125 MG tablet Commonly known as: AUGMENTIN Take 1 tablet by mouth 2 (two) times daily for 2 days. Start taking on:  April 17, 2022   Aspirin Low Dose 81 MG tablet Generic drug: aspirin EC TAKE 1 TABLET(81 MG) BY MOUTH DAILY. SWALLOW WHOLE   azithromycin 250 MG tablet Commonly known as: ZITHROMAX Take 1 tablet (250 mg total) by mouth daily for 2 days. Start taking on: April 17, 2022   carvedilol 3.125 MG tablet Commonly known as: COREG TAKE 1 TABLET BY MOUTH TWICE DAILY WITH A MEAL   citalopram 40 MG tablet Commonly known as: CELEXA Take 1 tablet (40 mg total) by mouth daily.   clobetasol cream 0.05 % Commonly known as: TEMOVATE Apply 1 application. topically 2 (two) times daily.   clonazePAM 1 MG tablet Commonly known as: KLONOPIN TAKE 1 TABLET BY MOUTH EVERY MORNING, 1/2 TABLET EVERY AFTERNOON AND 1 TABLET EVERY NIGHT AT BEDTIME   diltiazem 120 MG 24 hr capsule Commonly known as: CARDIZEM CD TAKE 1 CAPSULE BY MOUTH EVERY DAY   fenofibrate 145 MG tablet Commonly known as: Tricor Take 1 tablet (145 mg total) by mouth daily.   fluticasone furoate-vilanterol 100-25 MCG/ACT Aepb Commonly known as: BREO ELLIPTA Inhale 1 puff into the lungs daily.   hydrOXYzine 25 MG tablet Commonly known as: ATARAX Take 25 mg by mouth at bedtime.   ipratropium-albuterol 0.5-2.5 (3) MG/3ML Soln Commonly known as: DUONEB Take 3 mLs by nebulization every 6 (six) hours as needed (wheezing / shortness of breath). What changed: reasons to take this   levothyroxine 175 MCG tablet Commonly known as: SYNTHROID Take 1 tablet (175 mcg total) by mouth daily.   lisinopril 10 MG tablet Commonly known as: ZESTRIL TAKE 1 TABLET(10 MG) BY MOUTH DAILY   predniSONE 20 MG tablet Commonly known as: DELTASONE Take 2 tablets (40 mg total) by mouth daily with breakfast for 1 day. Start taking on: April 17, 2022   [facility] Misc 1 Device by Does not apply route daily.   rosuvastatin 10 MG tablet Commonly known as: CRESTOR TAKE 1 TABLET(10 MG) BY MOUTH DAILY   Vitamin D (Ergocalciferol) 1.25 MG  (50000 UNIT) Caps capsule Commonly known as: DRISDOL Take 50,000 Units by mouth 2 (two) times a week.               Durable Medical Equipment  (From admission, onward)           Start     Ordered   04/16/22 1325  For home use only DME oxygen  Once       Question Answer Comment  Length of Need 6 Months   Mode or (Route) Nasal cannula   Liters per Minute 2   Oxygen delivery system Gas      04/16/22 1325             Follow-up Information     Jearld Fenton, NP. Schedule an appointment as soon as possible for a visit.   Specialties: Internal Medicine, Emergency Medicine Contact information: [address] [place] [address] [phone]  Allergies  Allergen Reactions   Ciprofloxacin Hives and Diarrhea   Mucinex [Guaifenesin Er] Nausea Only    Per pt   Oxycodone Diarrhea   Oxycodone-Acetaminophen Diarrhea   Oxycodone-Acetaminophen Diarrhea   Sulfa Antibiotics Rash    Throat, mouth Throat, mouth     Subjective: t reports breathing better today, mild cough, mild DOE   Discharge Exam: BP (!) 156/63 (BP Location: Left Arm)   Pulse 71   Temp 97.7 F (36.5 C) (Oral)   Resp 18   Ht '5\' 3"'$  (1.6 m)   Wt 91.5 kg   SpO2 91%   BMI 35.75 kg/m  General: Pt is alert, awake, not in acute distress Cardiovascular: RRR, S1/S2 +, no rubs, no gallops Respiratory: CTA bilaterally but diminished breath sounds all fields, no wheezing, no rhonchi Abdominal: Soft, NT, ND, bowel sounds + Extremities: no edema, no cyanosis     The results of significant diagnostics from this hospitalization (including imaging, microbiology, ancillary and laboratory) are listed below for reference.     Microbiology: Recent Results (from the past 240 hour(s))  Resp panel by RT-PCR (RSV, Flu A&B, Covid) Anterior Nasal Swab     Status: None   Collection Time: 04/12/22  5:01 PM   Specimen: Anterior Nasal Swab  Result Value Ref Range Status   SARS Coronavirus 2  by RT PCR NEGATIVE NEGATIVE Final    Comment: (NOTE) SARS-CoV-2 target nucleic acids are NOT DETECTED.  The SARS-CoV-2 RNA is generally detectable in upper respiratory specimens during the acute phase of infection. The lowest concentration of SARS-CoV-2 viral copies this assay can detect is 138 copies/mL. A negative result does not preclude SARS-Cov-2 infection and should not be used as the sole basis for treatment or other patient management decisions. A negative result may occur with  improper specimen collection/handling, submission of specimen other than nasopharyngeal swab, presence of viral mutation(s) within the areas targeted by this assay, and inadequate number of viral copies(<138 copies/mL). A negative result must be combined with clinical observations, patient history, and epidemiological information. The expected result is Negative.  Fact Sheet for Patients:  EntrepreneurPulse.com.au  Fact Sheet for Healthcare Providers:  IncredibleEmployment.be  This test is no t yet approved or cleared by the [place] FDA and  has been authorized for detection and/or diagnosis of SARS-CoV-2 by FDA under an Emergency Use Authorization (EUA). This EUA will remain  in effect (meaning this test can be used) for the duration of the COVID-19 declaration under Section 564(b)(1) of the Act, 21 U.S.C.section 360bbb-3(b)(1), unless the authorization is terminated  or revoked sooner.       Influenza A by PCR NEGATIVE NEGATIVE Final   Influenza B by PCR NEGATIVE NEGATIVE Final    Comment: (NOTE) The Xpert Xpress SARS-CoV-2/FLU/RSV plus assay is intended as an aid in the diagnosis of influenza from Nasopharyngeal swab specimens and should not be used as a sole basis for treatment. Nasal washings and aspirates are unacceptable for Xpert Xpress SARS-CoV-2/FLU/RSV testing.  Fact Sheet for Patients: EntrepreneurPulse.com.au  Fact  Sheet for Healthcare Providers: IncredibleEmployment.be  This test is not yet approved or cleared by the [place] FDA and has been authorized for detection and/or diagnosis of SARS-CoV-2 by FDA under an Emergency Use Authorization (EUA). This EUA will remain in effect (meaning this test can be used) for the duration of the COVID-19 declaration under Section 564(b)(1) of the Act, 21 U.S.C. section 360bbb-3(b)(1), unless the authorization is terminated or revoked.     Resp  Syncytial Virus by PCR NEGATIVE NEGATIVE Final    Comment: (NOTE) Fact Sheet for Patients: EntrepreneurPulse.com.au  Fact Sheet for Healthcare Providers: IncredibleEmployment.be  This test is not yet approved or cleared by the [place] FDA and has been authorized for detection and/or diagnosis of SARS-CoV-2 by FDA under an Emergency Use Authorization (EUA). This EUA will remain in effect (meaning this test can be used) for the duration of the COVID-19 declaration under Section 564(b)(1) of the Act, 21 U.S.C. section 360bbb-3(b)(1), unless the authorization is terminated or revoked.  Performed at [facility], [address] [place], [place] [address]      Labs: BNP (last 3 results) No results for input(s): "BNP" in the last 8760 hours. Basic Metabolic Panel: Recent Labs  Lab 04/12/22 1730 04/15/22 0600  NA 134* 138  K 3.9 4.3  CL 93* 96*  CO2 34* 36*  GLUCOSE 151* 134*  BUN 31* 33*  CREATININE 0.73 0.80  CALCIUM 8.6* 8.8*   Liver Function Tests: No results for input(s): "AST", "ALT", "ALKPHOS", "BILITOT", "PROT", "ALBUMIN" in the last 168 hours. No results for input(s): "LIPASE", "AMYLASE" in the last 168 hours. No results for input(s): "AMMONIA" in the last 168 hours. CBC: Recent Labs  Lab 04/12/22 1630 04/15/22 0600  WBC 10.9* 11.9*  NEUTROABS 6.9  --   HGB 15.8* 15.1*  HCT 50.5* 49.4*  MCV 94.4 95.7  PLT 308  219   Cardiac Enzymes: No results for input(s): "CKTOTAL", "CKMB", "CKMBINDEX", "TROPONINI" in the last 168 hours. BNP: Invalid input(s): "POCBNP" CBG: No results for input(s): "GLUCAP" in the last 168 hours. D-Dimer No results for input(s): "DDIMER" in the last 72 hours. Hgb A1c No results for input(s): "HGBA1C" in the last 72 hours. Lipid Profile No results for input(s): "CHOL", "HDL", "LDLCALC", "TRIG", "CHOLHDL", "LDLDIRECT" in the last 72 hours. Thyroid function studies No results for input(s): "TSH", "T4TOTAL", "T3FREE", "THYROIDAB" in the last 72 hours.  Invalid input(s): "FREET3" Anemia work up No results for input(s): "VITAMINB12", "FOLATE", "FERRITIN", "TIBC", "IRON", "RETICCTPCT" in the last 72 hours. Urinalysis    Component Value Date/Time   COLORURINE YELLOW (A) 12/30/2017 0649   APPEARANCEUR CLEAR (A) 12/30/2017 0649   APPEARANCEUR Clear 07/26/2016 1445   LABSPEC 1.013 12/30/2017 0649   LABSPEC 1.012 10/10/2011 1219   PHURINE 6.0 12/30/2017 0649   GLUCOSEU NEGATIVE 12/30/2017 0649   GLUCOSEU Negative 10/10/2011 1219   HGBUR NEGATIVE 12/30/2017 0649   BILIRUBINUR negative 07/10/2019 0934   BILIRUBINUR Negative 07/26/2016 1445   BILIRUBINUR Negative 10/10/2011 [place] 12/30/2017 0649   PROTEINUR Negative 07/10/2019 0934   PROTEINUR NEGATIVE 12/30/2017 0649   UROBILINOGEN 0.2 07/10/2019 0934   NITRITE negative 07/10/2019 0934   NITRITE NEGATIVE 12/30/2017 0649   LEUKOCYTESUR Small (1+) (A) 07/10/2019 0934   LEUKOCYTESUR 3+ (A) 07/26/2016 1445   LEUKOCYTESUR 2+ 10/10/2011 1219   Sepsis Labs Recent Labs  Lab 04/12/22 1630 04/15/22 0600  WBC 10.9* 11.9*   Microbiology Recent Results (from the past 240 hour(s))  Resp panel by RT-PCR (RSV, Flu A&B, Covid) Anterior Nasal Swab     Status: None   Collection Time: 04/12/22  5:01 PM   Specimen: Anterior Nasal Swab  Result Value Ref Range Status   SARS Coronavirus 2 by RT PCR NEGATIVE  NEGATIVE Final    Comment: (NOTE) SARS-CoV-2 target nucleic acids are NOT DETECTED.  The SARS-CoV-2 RNA is generally detectable in upper respiratory specimens during the acute phase of infection. The lowest  concentration of SARS-CoV-2 viral copies this assay can detect is 138 copies/mL. A negative result does not preclude SARS-Cov-2 infection and should not be used as the sole basis for treatment or other patient management decisions. A negative result may occur with  improper specimen collection/handling, submission of specimen other than nasopharyngeal swab, presence of viral mutation(s) within the areas targeted by this assay, and inadequate number of viral copies(<138 copies/mL). A negative result must be combined with clinical observations, patient history, and epidemiological information. The expected result is Negative.  Fact Sheet for Patients:  EntrepreneurPulse.com.au  Fact Sheet for Healthcare Providers:  IncredibleEmployment.be  This test is no t yet approved or cleared by the [place] FDA and  has been authorized for detection and/or diagnosis of SARS-CoV-2 by FDA under an Emergency Use Authorization (EUA). This EUA will remain  in effect (meaning this test can be used) for the duration of the COVID-19 declaration under Section 564(b)(1) of the Act, 21 U.S.C.section 360bbb-3(b)(1), unless the authorization is terminated  or revoked sooner.       Influenza A by PCR NEGATIVE NEGATIVE Final   Influenza B by PCR NEGATIVE NEGATIVE Final    Comment: (NOTE) The Xpert Xpress SARS-CoV-2/FLU/RSV plus assay is intended as an aid in the diagnosis of influenza from Nasopharyngeal swab specimens and should not be used as a sole basis for treatment. Nasal washings and aspirates are unacceptable for Xpert Xpress SARS-CoV-2/FLU/RSV testing.  Fact Sheet for Patients: EntrepreneurPulse.com.au  Fact Sheet for Healthcare  Providers: IncredibleEmployment.be  This test is not yet approved or cleared by the [place] FDA and has been authorized for detection and/or diagnosis of SARS-CoV-2 by FDA under an Emergency Use Authorization (EUA). This EUA will remain in effect (meaning this test can be used) for the duration of the COVID-19 declaration under Section 564(b)(1) of the Act, 21 U.S.C. section 360bbb-3(b)(1), unless the authorization is terminated or revoked.     Resp Syncytial Virus by PCR NEGATIVE NEGATIVE Final    Comment: (NOTE) Fact Sheet for Patients: EntrepreneurPulse.com.au  Fact Sheet for Healthcare Providers: IncredibleEmployment.be  This test is not yet approved or cleared by the [place] FDA and has been authorized for detection and/or diagnosis of SARS-CoV-2 by FDA under an Emergency Use Authorization (EUA). This EUA will remain in effect (meaning this test can be used) for the duration of the COVID-19 declaration under Section 564(b)(1) of the Act, 21 U.S.C. section 360bbb-3(b)(1), unless the authorization is terminated or revoked.  Performed at [facility], [address], [place], [place] [address]    Imaging DG Chest Portable 1 View  Result Date: 04/12/2022 CLINICAL DATA:  Shortness of breath. EXAM: PORTABLE CHEST 1 VIEW COMPARISON:  12/06/2020, CT 12/07/2020 FINDINGS: Normal heart size with stable mediastinal contours. Aortic atherosclerosis. Chronic peribronchial thickening. Ill-defined heterogeneous bibasilar opacities. No pneumothorax or large pleural effusion. No acute osseous abnormalities on this portable view. IMPRESSION: 1. Ill-defined heterogeneous bibasilar opacities may be atelectasis or pneumonia. 2. Chronic bronchial thickening. Electronically Signed   By: Keith Rake M.D.   On: 04/12/2022 16:45      Time coordinating discharge: over 30 minutes  SIGNED:  Emeterio Reeve DO [facility]

## 2022-04-16 NOTE — Plan of Care (Signed)
?  Problem: Activity: ?Goal: Ability to tolerate increased activity will improve ?Outcome: Progressing ?Goal: Will verbalize the importance of balancing activity with adequate rest periods ?Outcome: Progressing ?  ?Problem: Respiratory: ?Goal: Ability to maintain a clear airway will improve ?Outcome: Progressing ?  ?

## 2022-04-16 NOTE — Progress Notes (Signed)
Patient Saturations on Room Air at Rest = 93%  Patient Saturations on [employer] while Ambulating = 86-87%  Patient Saturations on 1 Liters of oxygen while Ambulating = 93%  Fuller Mandril, RN

## 2022-04-17 ENCOUNTER — Telehealth: Payer: Self-pay | Admitting: *Deleted

## 2022-04-17 ENCOUNTER — Telehealth: Payer: Self-pay

## 2022-04-17 NOTE — Transitions of Care (Post Inpatient/ED Visit) (Signed)
   04/17/2022  Name: NIHARIKA SAVINO MRN: [id number] DOB: 08-04-1950  Today's TOC FU Call Status: Today's TOC FU Call Status:: Successful TOC FU Call Competed TOC FU Call Complete Date: 04/17/22  Transition Care Management Follow-up Telephone Call Date of Discharge: 04/16/22 Discharge Facility: [facility]) Type of Discharge: Inpatient Admission Primary Inpatient Discharge Diagnosis:: COPD with acute exacerbation How have you been since you were released from the hospital?: Better Any questions or concerns?: No  Items Reviewed: Did you receive and understand the discharge instructions provided?: Yes Medications obtained and verified?: Yes (Medications Reviewed) Any new allergies since your discharge?: No Dietary orders reviewed?: Yes Do you have support at home?: Yes  Home Care and Equipment/Supplies: [place] Ordered?: No Any new equipment or medical supplies ordered?: Yes Name of Medical supply agency?: [employer] - nebulizer and oxygen Were you able to get the equipment/medical supplies?: Yes Do you have any questions related to the use of the equipment/supplies?: No  Functional Questionnaire: Do you need assistance with bathing/showering or dressing?: No Do you need assistance with meal preparation?: No Do you need assistance with eating?: No Do you have difficulty maintaining continence: No Do you need assistance with getting out of bed/getting out of a chair/moving?: No Do you have difficulty managing or taking your medications?: No  Folllow up appointments reviewed: PCP Follow-up appointment confirmed?: Yes Date of PCP follow-up appointment?: 04/23/22 Follow-up Provider: [name] NP [facility] Follow-up appointment confirmed?: No Do you need transportation to your follow-up appointment?: No Do you understand care options if your condition(s) worsen?: Yes-patient verbalized understanding    Guadalupe Guerra  LPN [place] Direct Dial [phone]

## 2022-04-17 NOTE — Telephone Encounter (Signed)
Will discuss at upcoming appt.

## 2022-04-17 NOTE — Transitions of Care (Post Inpatient/ED Visit) (Signed)
   04/17/2022  Name: [name] MRN: [id number] DOB: 1950-06-01  Today's TOC FU Call Status: Today's TOC FU Call Status:: Successful TOC FU Call Competed TOC FU Call Complete Date: 04/17/22  Transition Care Management Follow-up Telephone Call Date of Discharge: 04/16/22 Discharge Facility: [facility]) Type of Discharge: Inpatient Admission Primary Inpatient Discharge Diagnosis:: COPD with exacerbation How have you been since you were released from the hospital?: Better Any questions or concerns?: Yes Patient Questions/Concerns:: Patient oxygen stopped working and she is waiting on a delivery Patient Questions/Concerns Addressed: Other: (RN contacted [employer] oxygen[employer]) made them aware they needs to get her oxygen out there Asap because hers has malfunction that they bought to her yesterday.)  Items Reviewed: Did you receive and understand the discharge instructions provided?: Yes Medications obtained and verified?: Yes (Medications Reviewed) Any new allergies since your discharge?: No Dietary orders reviewed?: Yes Type of Diet Ordered:: low sodium heart healthy Do you have support at home?: Yes People in Home: child(ren), adult Name of Support/Comfort Primary Source: [name]  Home Care and Equipment/Supplies: [place] Ordered?: No Any new equipment or medical supplies ordered?: Yes Name of Medical supply agency?: [employer] (nebulizer and oxygen.) Were you able to get the equipment/medical supplies?: Yes (the oxygen was delivered yesterday but malfunctioned. They are to bring anoother set up today.  [name] is to pick up the nebulizer) Do you have any questions related to the use of the equipment/supplies?: Yes What questions do you have?: Oxgen quit working/ needing replacement  Functional Questionnaire: Do you need assistance with bathing/showering or dressing?: No Do you need assistance with meal preparation?: No Do you need  assistance with eating?: No Do you have difficulty maintaining continence: No Do you need assistance with getting out of bed/getting out of a chair/moving?: No Do you have difficulty managing or taking your medications?: No  Folllow up appointments reviewed: PCP Follow-up appointment confirmed?: Yes Date of PCP follow-up appointment?: 04/23/22 Follow-up Provider: [name] NP [facility] Follow-up appointment confirmed?: NA Do you need transportation to your follow-up appointment?: No Do you understand care options if your condition(s) worsen?: Yes-patient verbalized understanding TOC Interventions Today    Flowsheet Row Most Recent Value  TOC Interventions   TOC Interventions Discussed/Reviewed TOC Interventions Discussed, TOC Interventions Reviewed, Contact DME company for patient use of equipment  [Patient stated that she is being foloowed by [employer] nurse practitioner that comes out every 3 months. RN contacted DME company regarding oxygen]      Interventions Today    Flowsheet Row Most Recent Value  Chronic Disease   Chronic disease during today's visit Chronic Obstructive Pulmonary Disease (COPD)  General Interventions   General Interventions Discussed/Reviewed General Interventions Discussed, General Interventions Reviewed, Doctor Visits  Doctor Visits Discussed/Reviewed Doctor Visits Discussed, Doctor Visits Reviewed         [place] [facility] [phone]

## 2022-04-21 ENCOUNTER — Other Ambulatory Visit: Payer: Self-pay | Admitting: Internal Medicine

## 2022-04-21 DIAGNOSIS — I10 Essential (primary) hypertension: Secondary | ICD-10-CM

## 2022-04-23 ENCOUNTER — Inpatient Hospital Stay: Payer: PPO | Admitting: Internal Medicine

## 2022-04-23 NOTE — Telephone Encounter (Signed)
Requested Prescriptions  Pending Prescriptions Disp Refills   lisinopril (ZESTRIL) 10 MG tablet [Pharmacy Med Name: LISINOPRIL 10MG  TABLETS] 90 tablet 0    Sig: TAKE 1 TABLET(10 MG) BY MOUTH DAILY     Cardiovascular:  ACE Inhibitors Failed - 04/21/2022 11:34 AM      Failed - Last BP in normal range    BP Readings from Last 1 Encounters:  04/16/22 (!) 156/63         Passed - Cr in normal range and within 180 days    Creat  Date Value Ref Range Status  03/05/2022 0.66 0.60 - 1.00 mg/dL Final   Creatinine, Ser  Date Value Ref Range Status  04/15/2022 0.80 0.44 - 1.00 mg/dL Final   Creatinine, Urine  Date Value Ref Range Status  03/05/2022 76 20 - 275 mg/dL Final         Passed - K in normal range and within 180 days    Potassium  Date Value Ref Range Status  04/15/2022 4.3 3.5 - 5.1 mmol/L Final  02/02/2013 3.7 3.5 - 5.1 mmol/L Final         Passed - Patient is not pregnant      Passed - Valid encounter within last 6 months    Recent Outpatient Visits           1 month ago Mixed hyperlipidemia   [place], Coralie Keens, NP   9 months ago Acquired hypothyroidism   [facility] [place], Coralie Keens, NP   1 year ago Screening for colon cancer   [place], Coralie Keens, NP   1 year ago Benign essential HTN   [facility] [place], [place] W, NP   1 year ago Viral URI with cough   [facility] [place], Coralie Keens, NP       Future Appointments             In 3 days [place], Coralie Keens, NP [facility]             carvedilol (COREG) 3.125 MG tablet [Pharmacy Med Name: CARVEDILOL 3.125MG  TABLETS] 180 tablet 0    Sig: TAKE 1 TABLET BY MOUTH TWICE DAILY WITH A MEAL     Cardiovascular: Beta Blockers 3 Failed - 04/21/2022 11:34 AM      Failed - Last BP in normal range    BP Readings from Last 1 Encounters:   04/16/22 (!) 156/63         Passed - Cr in normal range and within 360 days    Creat  Date Value Ref Range Status  03/05/2022 0.66 0.60 - 1.00 mg/dL Final   Creatinine, Ser  Date Value Ref Range Status  04/15/2022 0.80 0.44 - 1.00 mg/dL Final   Creatinine, Urine  Date Value Ref Range Status  03/05/2022 76 20 - 275 mg/dL Final         Passed - AST in normal range and within 360 days    AST  Date Value Ref Range Status  03/05/2022 13 10 - 35 U/L Final         Passed - ALT in normal range and within 360 days    ALT  Date Value Ref Range Status  03/05/2022 12 6 - 29 U/L Final         Passed - Last Heart Rate in  normal range    Pulse Readings from Last 1 Encounters:  04/16/22 71         Passed - Valid encounter within last 6 months    Recent Outpatient Visits           1 month ago Mixed hyperlipidemia   [facility] [place], Coralie Keens, NP   9 months ago Acquired hypothyroidism   [facility] [place], Coralie Keens, NP   1 year ago Screening for colon cancer   [facility] [place], Coralie Keens, NP   1 year ago Benign essential HTN   [facility] [place], Coralie Keens, NP   1 year ago Viral URI with cough   [facility] [place], Coralie Keens, NP       Future Appointments             In 3 days [place], Coralie Keens, NP [facility], [facility]

## 2022-04-26 ENCOUNTER — Ambulatory Visit (INDEPENDENT_AMBULATORY_CARE_PROVIDER_SITE_OTHER): Payer: PPO | Admitting: Internal Medicine

## 2022-04-26 ENCOUNTER — Encounter: Payer: Self-pay | Admitting: Internal Medicine

## 2022-04-26 VITALS — BP 134/72 | HR 86 | Temp 96.9°F | Wt 197.0 lb

## 2022-04-26 DIAGNOSIS — J9601 Acute respiratory failure with hypoxia: Secondary | ICD-10-CM | POA: Diagnosis not present

## 2022-04-26 DIAGNOSIS — J441 Chronic obstructive pulmonary disease with (acute) exacerbation: Secondary | ICD-10-CM | POA: Diagnosis not present

## 2022-04-26 NOTE — Patient Instructions (Signed)

## 2022-04-26 NOTE — Progress Notes (Signed)
Subjective:    Patient ID: [name], female    DOB: 20-Jul-1950, 72 y.o.   MRN: [id number]  HPI  Patient presents to the clinic today for TCM hospital follow-up.  She presented to the ER 04/12/2022 with complaint of shortness of breath.  She has a history of COPD, managed on Breztri and Albuterol.  Her initial saturations were 79% on room air.  Her respiratory virus panel was negative.  She was started on oxygen at 2 to 3 L which kept her O2 sats around 88%.  Chest x-ray was concerning for possible infiltrate versus bronchitis with atelectasis.  She was treated with Methylprednisone, Magnesium, DuoNebs, Rocephin and Azithromycin.  She was discharged on 04/16/2022 with supplemental oxygen and advised to finish out her steroids and antibiotics.  Since that time, she reports she is feeling much better.  She has an intermittent cough that is typically nonproductive.  She denies shortness of breath.  She reports that she wears oxygen 24/7 at home and is currently on 1 L.  She reports she has not tried to wean this because she feels so much better when she is wearing it.  She is not currently wearing her oxygen at this appointment and her O2 sats are 97% on room air.  She has not made her follow-up appointment with pulmonology yet.  She reports she quit smoking 2 weeks ago.  Review of Systems     Past Medical History:  Diagnosis Date   Anxiety    Arthritis    Chronic back pain    COPD (chronic obstructive pulmonary disease) (HCC)    Depression    GERD (gastroesophageal reflux disease)    Hypertension    Hypothyroidism    Pneumonia    RECENT X 3   PONV (postoperative nausea and vomiting)    Psoriasis    Thyroid disease     Current Outpatient Medications  Medication Sig Dispense Refill   acetaminophen (TYLENOL) 500 MG tablet Take 500 mg by mouth every 6 (six) hours as needed for moderate pain.      albuterol (VENTOLIN HFA) 108 (90 Base) MCG/ACT inhaler INHALE 2 PUFFS BY MOUTH EVERY 6 HOURS AS  NEEDED FOR WHEEZING OR SHORTNESS OF BREATH 8.5 g 2   ASPIRIN LOW DOSE 81 MG tablet TAKE 1 TABLET(81 MG) BY MOUTH DAILY. SWALLOW WHOLE 90 tablet 1   carvedilol (COREG) 3.125 MG tablet TAKE 1 TABLET BY MOUTH TWICE DAILY WITH A MEAL 180 tablet 0   citalopram (CELEXA) 40 MG tablet Take 1 tablet (40 mg total) by mouth daily. 90 tablet 1   clobetasol cream (TEMOVATE) [id number] % Apply 1 application. topically 2 (two) times daily.     clonazePAM (KLONOPIN) 1 MG tablet TAKE 1 TABLET BY MOUTH EVERY MORNING, 1/2 TABLET EVERY AFTERNOON AND 1 TABLET EVERY NIGHT AT BEDTIME 75 tablet 0   diltiazem (CARDIZEM CD) 120 MG 24 hr capsule TAKE 1 CAPSULE BY MOUTH EVERY DAY 90 capsule 1   fenofibrate (TRICOR) 145 MG tablet Take 1 tablet (145 mg total) by mouth daily. 90 tablet 1   fluticasone furoate-vilanterol (BREO ELLIPTA) 100-25 MCG/ACT AEPB Inhale 1 puff into the lungs daily. 1 each 0   hydrOXYzine (ATARAX) 25 MG tablet Take 25 mg by mouth at bedtime.     ipratropium-albuterol (DUONEB) 0.5-2.5 (3) MG/3ML SOLN Take 3 mLs by nebulization every 6 (six) hours as needed (wheezing / shortness of breath). 60 mL 0   levothyroxine (SYNTHROID) 175 MCG tablet Take 1  tablet (175 mcg total) by mouth daily. 90 tablet 1   lisinopril (ZESTRIL) 10 MG tablet TAKE 1 TABLET(10 MG) BY MOUTH DAILY 90 tablet 0   Misc. Devices (QUAD CANE/SMALL BASE) MISC 1 Device by Does not apply route daily. (Patient not taking: Reported on 04/17/2022) 1 each 0   rosuvastatin (CRESTOR) 10 MG tablet TAKE 1 TABLET(10 MG) BY MOUTH DAILY 90 tablet 1   Vitamin D, Ergocalciferol, (DRISDOL) 1.25 MG (50000 UNIT) CAPS capsule Take 50,000 Units by mouth 2 (two) times a week.     No current facility-administered medications for this visit.    Allergies  Allergen Reactions   Ciprofloxacin Hives and Diarrhea   Mucinex [Guaifenesin Er] Nausea Only    Per pt   Oxycodone Diarrhea   Oxycodone-Acetaminophen Diarrhea   Oxycodone-Acetaminophen Diarrhea   Sulfa  Antibiotics Rash    Throat, mouth Throat, mouth    Family History  Problem Relation Age of Onset   Heart disease Mother    Breast cancer Mother    Heart attack Mother    COPD Father    Stroke Paternal Uncle    Kidney disease Sister    Breast cancer Cousin    Breast cancer Cousin    Heart attack Maternal Aunt    Stroke Paternal Grandfather    Kidney cancer Neg Hx    Bladder Cancer Neg Hx     Social History   Socioeconomic History   Marital status: Widowed    Spouse name: Not on file   Number of children: Not on file   Years of education: Not on file   Highest education level: Not on file  Occupational History   Occupation: retired  Tobacco Use   Smoking status: Every Day    Packs/day: 1.00    Years: 35.00    Additional pack years: 0.00    Total pack years: 35.00    Types: Cigarettes    Start date: 04/19/2018    Last attempt to quit: 11/16/2020    Years since quitting: 1.4   Smokeless tobacco: Never   Tobacco comments:    Previously smoked 2 ppd.   Vaping Use   Vaping Use: Never used  Substance and Sexual Activity   Alcohol use: No   Drug use: No   Sexual activity: Not Currently    Birth control/protection: Surgical  Other Topics Concern   Not on file  Social History Narrative   Not on file   Social Determinants of Health   Financial Resource Strain: Low Risk  (04/21/2021)   Overall Financial Resource Strain (CARDIA)    Difficulty of Paying Living Expenses: Not hard at all  Food Insecurity: No Food Insecurity (04/13/2022)   Hunger Vital Sign    Worried About Running Out of Food in the Last Year: Never true    Ran Out of Food in the Last Year: Never true  Transportation Needs: No Transportation Needs (04/12/2022)   PRAPARE - Hydrologist (Medical): No    Lack of Transportation (Non-Medical): No  Physical Activity: Insufficiently Active (04/21/2021)   Exercise Vital Sign    Days of Exercise per Week: 2 days    Minutes of  Exercise per Session: 30 min  Stress: No Stress Concern Present (04/21/2021)   [place]    Feeling of Stress : Only a little  Social Connections: Unknown (04/21/2021)   Social Connection and Isolation Panel [NHANES]    Frequency of  Communication with Friends and Family: More than three times a week    Frequency of Social Gatherings with Friends and Family: Three times a week    Attends Religious Services: Never    Active Member of Clubs or Organizations: No    Attends Archivist Meetings: Never    Marital Status: Not on file  Intimate Partner Violence: Not At Risk (04/12/2022)   Humiliation, Afraid, Rape, and Kick questionnaire    Fear of Current or Ex-Partner: No    Emotionally Abused: No    Physically Abused: No    Sexually Abused: No     Constitutional: Denies fever, malaise, fatigue, headache or abrupt weight changes.  HEENT: Denies eye pain, eye redness, ear pain, ringing in the ears, wax buildup, runny nose, nasal congestion, bloody nose, or sore throat. Respiratory: Patient reports intermittent cough.  Denies difficulty breathing, shortness of breath, cough or sputum production.   Cardiovascular: Denies chest pain, chest tightness, palpitations or swelling in the hands or feet.  Musculoskeletal: Patient reports joint pain, difficulty with gait.  Denies decrease in range of motion, muscle pain or joint swelling.  Skin: Denies redness, rashes, lesions or ulcercations.  Neurological: Patient reports insomnia.  Denies dizziness, difficulty with memory, difficulty with speech or problems with balance and coordination.   No other specific complaints in a complete review of systems (except as listed in HPI above).  Objective:   Physical Exam   BP 134/72 (BP Location: Left Arm, Patient Position: Sitting, Cuff Size: Normal)   Pulse 86   Temp (!) 96.9 F (36.1 C) (Temporal)   Wt 197 lb (89.4 kg)   SpO2 97%    BMI 34.90 kg/m   Wt Readings from Last 3 Encounters:  04/12/22 201 lb 12.8 oz (91.5 kg)  03/05/22 201 lb (91.2 kg)  07/25/21 202 lb (91.6 kg)    General: Appears her stated age, obese, chronically ill-appearing, in NAD. Skin: Warm, dry and intact.  HEENT: Head: normal shape and size; Eyes: sclera white, no icterus, conjunctiva pink, PERRLA and EOMs intact;  Cardiovascular: Normal rate and rhythm. S1,S2 noted.  No murmur, rubs or gallops noted. No JVD or BLE edema.  Pulmonary/Chest: Normal effort and diminished breath sounds. No respiratory distress. No wheezes, rales or ronchi noted.  Musculoskeletal: Gait slow and steady with use of 4-prong cane. Neurological: Alert and oriented. Coordination normal.      BMET    Component Value Date/Time   NA 138 04/15/2022 0600   NA 142 06/24/2015 1102   NA 135 (L) 02/02/2013 1021   K 4.3 04/15/2022 0600   K 3.7 02/02/2013 1021   CL 96 (L) 04/15/2022 0600   CL 103 02/02/2013 1021   CO2 36 (H) 04/15/2022 0600   CO2 30 02/02/2013 1021   GLUCOSE 134 (H) 04/15/2022 0600   GLUCOSE 152 (H) 02/02/2013 1021   BUN 33 (H) 04/15/2022 0600   BUN 12 06/24/2015 1102   BUN 8 02/02/2013 1021   CREATININE 0.80 04/15/2022 0600   CREATININE 0.66 03/05/2022 1150   CALCIUM 8.8 (L) 04/15/2022 0600   CALCIUM 9.9 02/02/2013 1021   GFRNONAA >60 04/15/2022 0600   GFRNONAA 72 12/03/2019 0947   GFRAA 84 12/03/2019 0947    Lipid Panel     Component Value Date/Time   CHOL 107 03/05/2022 1150   CHOL 205 (H) 02/03/2015 1022   TRIG 188 (H) 03/05/2022 1150   HDL 40 (L) 03/05/2022 1150   HDL 36 (L) 02/03/2015 1022  CHOLHDL 2.7 03/05/2022 1150   LDLCALC 41 03/05/2022 1150    CBC    Component Value Date/Time   WBC 11.9 (H) 04/15/2022 0600   RBC 5.16 (H) 04/15/2022 0600   HGB 15.1 (H) 04/15/2022 0600   HGB 15.6 06/24/2015 1102   HCT 49.4 (H) 04/15/2022 0600   HCT 46.6 06/24/2015 1102   PLT 219 04/15/2022 0600   PLT 307 06/24/2015 1102   MCV 95.7  04/15/2022 0600   MCV 93 06/24/2015 1102   MCV 90 02/02/2013 1021   MCH 29.3 04/15/2022 0600   MCHC 30.6 04/15/2022 0600   RDW 11.9 04/15/2022 0600   RDW 13.3 06/24/2015 1102   RDW 13.2 02/02/2013 1021   LYMPHSABS 2.4 04/12/2022 1630   LYMPHSABS 2.3 06/24/2015 1102   MONOABS 1.1 (H) 04/12/2022 1630   EOSABS 0.5 04/12/2022 1630   EOSABS 0.3 06/24/2015 1102   BASOSABS 0.0 04/12/2022 1630   BASOSABS 0.1 06/24/2015 1102    Hgb A1C Lab Results  Component Value Date   HGBA1C 6.3 03/05/2022           Assessment & Plan:   [facility] follow-up for COPD Exacerbation:  Hospital notes, labs and imaging reviewed It is too soon for repeat chest x-ray to check for resolution of pneumonia She has finished her antibiotics and steroids Encouraged her to try to wean oxygen however she reports they told her she would be on it long term CBC and c-Met today Follow-up with pulmonology-advised her she needs to schedule this appointment  RTC in 4 months for annual exam [name], NP

## 2022-04-27 ENCOUNTER — Ambulatory Visit (INDEPENDENT_AMBULATORY_CARE_PROVIDER_SITE_OTHER): Payer: PPO

## 2022-04-27 VITALS — Ht 63.0 in | Wt 197.0 lb

## 2022-04-27 DIAGNOSIS — Z0000 Encounter for general adult medical examination without abnormal findings: Secondary | ICD-10-CM

## 2022-04-27 LAB — CBC
HCT: 45.7 % — ABNORMAL HIGH (ref 35.0–45.0)
Hemoglobin: 14.5 g/dL (ref 11.7–15.5)
MCH: 29.5 pg (ref 27.0–33.0)
MCHC: 31.7 g/dL — ABNORMAL LOW (ref 32.0–36.0)
MCV: 92.9 fL (ref 80.0–100.0)
MPV: 11.4 fL (ref 7.5–12.5)
Platelets: 199 10*3/uL (ref 140–400)
RBC: 4.92 10*6/uL (ref 3.80–5.10)
RDW: 11.4 % (ref 11.0–15.0)
WBC: 9.7 10*3/uL (ref 3.8–10.8)

## 2022-04-27 LAB — COMPLETE METABOLIC PANEL WITH GFR
AG Ratio: 1.7 (calc) (ref 1.0–2.5)
ALT: 23 U/L (ref 6–29)
AST: 21 U/L (ref 10–35)
Albumin: 3.7 g/dL (ref 3.6–5.1)
Alkaline phosphatase (APISO): 76 U/L (ref 37–153)
BUN: 18 mg/dL (ref 7–25)
CO2: 33 mmol/L — ABNORMAL HIGH (ref 20–32)
Calcium: 9.6 mg/dL (ref 8.6–10.4)
Chloride: 99 mmol/L (ref 98–110)
Creat: 0.91 mg/dL (ref 0.60–1.00)
Globulin: 2.2 g/dL (calc) (ref 1.9–3.7)
Glucose, Bld: 130 mg/dL — ABNORMAL HIGH (ref 65–99)
Potassium: 4.7 mmol/L (ref 3.5–5.3)
Sodium: 144 mmol/L (ref 135–146)
Total Bilirubin: 0.4 mg/dL (ref 0.2–1.2)
Total Protein: 5.9 g/dL — ABNORMAL LOW (ref 6.1–8.1)
eGFR: 67 mL/min/{1.73_m2} (ref >=60)

## 2022-04-27 NOTE — Patient Instructions (Signed)
[name] , Thank you for taking time to come for your Medicare Wellness Visit. I appreciate your ongoing commitment to your health goals. Please review the following plan we discussed and let me know if I can assist you in the future.   These are the goals we discussed:  Goals      Cut out extra servings     DIET - EAT MORE FRUITS AND VEGETABLES     Increase water intake     Recommend increasing water intake to 4 glasses of water a day.     Patient Stated     04/12/2020, no goals     Quit Smoking     Smoking cessation discussed        This is a list of the screening recommended for you and due dates:  Health Maintenance  Topic Date Due   Eye exam for diabetics  Never done   DTaP/Tdap/Td vaccine (1 - Tdap) Never done   Zoster (Shingles) Vaccine (1 of 2) Never done   Cologuard (Stool DNA test)  Never done   Mammogram  08/31/2018   Screening for Lung Cancer  12/07/2021   Flu Shot  05/06/2022*   COVID-19 Vaccine (1) 05/12/2022*   Hemoglobin A1C  09/03/2022   Yearly kidney health urinalysis for diabetes  03/06/2023   Complete foot exam   03/06/2023   Yearly kidney function blood test for diabetes  04/26/2023   Medicare Annual Wellness Visit  04/27/2023   Pneumonia Vaccine  Completed   DEXA scan (bone density measurement)  Completed   Hepatitis C Screening: USPSTF Recommendation to screen - Ages 11-79 yo.  Completed   HPV Vaccine  Aged Out  *Topic was postponed. The date shown is not the original due date.    Advanced directives: no  Conditions/risks identified: none  Next appointment: Follow up in one year for your annual wellness visit 05/02/23 @ 3:00 pm by phone   Preventive Care 65 Years and Older, Female Preventive care refers to lifestyle choices and visits with your health care provider that can promote health and wellness. What does preventive care include? A yearly physical exam. This is also called an annual well check. Dental exams once or twice a  year. Routine eye exams. Ask your health care provider how often you should have your eyes checked. Personal lifestyle choices, including: Daily care of your teeth and gums. Regular physical activity. Eating a healthy diet. Avoiding tobacco and drug use. Limiting alcohol use. Practicing safe sex. Taking low-dose aspirin every day. Taking vitamin and mineral supplements as recommended by your health care provider. What happens during an annual well check? The services and screenings done by your health care provider during your annual well check will depend on your age, overall health, lifestyle risk factors, and family history of disease. Counseling  Your health care provider may ask you questions about your: Alcohol use. Tobacco use. Drug use. Emotional well-being. Home and relationship well-being. Sexual activity. Eating habits. History of falls. Memory and ability to understand (cognition). Work and work Statistician. Reproductive health. Screening  You may have the following tests or measurements: Height, weight, and BMI. Blood pressure. Lipid and cholesterol levels. These may be checked every 5 years, or more frequently if you are over 66 years old. Skin check. Lung cancer screening. You may have this screening every year starting at age 58 if you have a 30-pack-year history of smoking and currently smoke or have quit within the past 15 years. Fecal  occult blood test (FOBT) of the stool. You may have this test every year starting at age 63. Flexible sigmoidoscopy or colonoscopy. You may have a sigmoidoscopy every 5 years or a colonoscopy every 10 years starting at age 42. Hepatitis C blood test. Hepatitis B blood test. Sexually transmitted disease (STD) testing. Diabetes screening. This is done by checking your blood sugar (glucose) after you have not eaten for a while (fasting). You may have this done every 1-3 years. Bone density scan. This is done to screen for  osteoporosis. You may have this done starting at age 76. Mammogram. This may be done every 1-2 years. Talk to your health care provider about how often you should have regular mammograms. Talk with your health care provider about your test results, treatment options, and if necessary, the need for more tests. Vaccines  Your health care provider may recommend certain vaccines, such as: Influenza vaccine. This is recommended every year. Tetanus, diphtheria, and acellular pertussis (Tdap, Td) vaccine. You may need a Td booster every 10 years. Zoster vaccine. You may need this after age 9. Pneumococcal 13-valent conjugate (PCV13) vaccine. One dose is recommended after age 9. Pneumococcal polysaccharide (PPSV23) vaccine. One dose is recommended after age 4. Talk to your health care provider about which screenings and vaccines you need and how often you need them. This information is not intended to replace advice given to you by your health care provider. Make sure you discuss any questions you have with your health care provider. Document Released: 02/18/2015 Document Revised: 10/12/2015 Document Reviewed: 11/23/2014 Elsevier Interactive Patient Education  2017 [place] Prevention in the Home Falls can cause injuries. They can happen to people of all ages. There are many things you can do to make your home safe and to help prevent falls. What can I do on the outside of my home? Regularly fix the edges of walkways and driveways and fix any cracks. Remove anything that might make you trip as you walk through a door, such as a raised step or threshold. Trim any bushes or trees on the path to your home. Use bright outdoor lighting. Clear any walking paths of anything that might make someone trip, such as rocks or tools. Regularly check to see if handrails are loose or broken. Make sure that both sides of any steps have handrails. Any raised decks and porches should have guardrails on  the edges. Have any leaves, snow, or ice cleared regularly. Use sand or salt on walking paths during winter. Clean up any spills in your garage right away. This includes oil or grease spills. What can I do in the bathroom? Use night lights. Install grab bars by the toilet and in the tub and shower. Do not use towel bars as grab bars. Use non-skid mats or decals in the tub or shower. If you need to sit down in the shower, use a plastic, non-slip stool. Keep the floor dry. Clean up any water that spills on the floor as soon as it happens. Remove soap buildup in the tub or shower regularly. Attach bath mats securely with double-sided non-slip rug tape. Do not have throw rugs and other things on the floor that can make you trip. What can I do in the bedroom? Use night lights. Make sure that you have a light by your bed that is easy to reach. Do not use any sheets or blankets that are too big for your bed. They should not hang down onto the  floor. Have a firm chair that has side arms. You can use this for support while you get dressed. Do not have throw rugs and other things on the floor that can make you trip. What can I do in the kitchen? Clean up any spills right away. Avoid walking on wet floors. Keep items that you use a lot in easy-to-reach places. If you need to reach something above you, use a strong step stool that has a grab bar. Keep electrical cords out of the way. Do not use floor polish or wax that makes floors slippery. If you must use wax, use non-skid floor wax. Do not have throw rugs and other things on the floor that can make you trip. What can I do with my stairs? Do not leave any items on the stairs. Make sure that there are handrails on both sides of the stairs and use them. Fix handrails that are broken or loose. Make sure that handrails are as long as the stairways. Check any carpeting to make sure that it is firmly attached to the stairs. Fix any carpet that is loose  or worn. Avoid having throw rugs at the top or bottom of the stairs. If you do have throw rugs, attach them to the floor with carpet tape. Make sure that you have a light switch at the top of the stairs and the bottom of the stairs. If you do not have them, ask someone to add them for you. What else can I do to help prevent falls? Wear shoes that: Do not have high heels. Have rubber bottoms. Are comfortable and fit you well. Are closed at the toe. Do not wear sandals. If you use a stepladder: Make sure that it is fully opened. Do not climb a closed stepladder. Make sure that both sides of the stepladder are locked into place. Ask someone to hold it for you, if possible. Clearly mark and make sure that you can see: Any grab bars or handrails. First and last steps. Where the edge of each step is. Use tools that help you move around (mobility aids) if they are needed. These include: Canes. Walkers. Scooters. Crutches. Turn on the lights when you go into a dark area. Replace any light bulbs as soon as they burn out. Set up your furniture so you have a clear path. Avoid moving your furniture around. If any of your floors are uneven, fix them. If there are any pets around you, be aware of where they are. Review your medicines with your doctor. Some medicines can make you feel dizzy. This can increase your chance of falling. Ask your doctor what other things that you can do to help prevent falls. This information is not intended to replace advice given to you by your health care provider. Make sure you discuss any questions you have with your health care provider. Document Released: 11/18/2008 Document Revised: 06/30/2015 Document Reviewed: 02/26/2014 Elsevier Interactive Patient Education  2017 [employer].

## 2022-04-27 NOTE — Progress Notes (Signed)
I connected with  [name] on 04/27/22 by a audio enabled telemedicine application and verified that I am speaking with the correct person using two identifiers.  Patient Location: Home  Provider Location: Office/Clinic  I discussed the limitations of evaluation and management by telemedicine. The patient expressed understanding and agreed to proceed.  Subjective:   [name] is a 72 y.o. female who presents for Medicare Annual (Subsequent) preventive examination.  Review of Systems     Cardiac Risk Factors include: advanced age (>45men, >15 women);hypertension;smoking/ tobacco exposure;sedentary lifestyle;dyslipidemia     Objective:    There were no vitals filed for this visit. There is no height or weight on file to calculate BMI.     04/27/2022    8:50 AM 04/12/2022    9:20 PM 04/12/2022    4:22 PM 04/21/2021    9:08 AM 12/02/2020    4:13 PM 04/12/2020    9:03 AM 04/07/2019    9:20 AM  Advanced Directives  Does Patient Have a Medical Advance Directive? No No No No No;Yes Yes Yes  Type of Tax inspector of [employer] Power of Attorney  Does patient want to make changes to medical advance directive?     No - Patient declined    Copy of [place] in Chart?     No - copy requested No - copy requested No - copy requested  Would patient like information on creating a medical advance directive? No - Patient declined No - Patient declined  No - Patient declined       Current Medications (verified) Outpatient Encounter Medications as of 04/27/2022  Medication Sig   acetaminophen (TYLENOL) 500 MG tablet Take 500 mg by mouth every 6 (six) hours as needed for moderate pain.    albuterol (VENTOLIN HFA) 108 (90 Base) MCG/ACT inhaler INHALE 2 PUFFS BY MOUTH EVERY 6 HOURS AS NEEDED FOR WHEEZING OR SHORTNESS OF BREATH   ASPIRIN LOW DOSE 81 MG tablet TAKE 1 TABLET(81 MG) BY MOUTH DAILY. SWALLOW WHOLE    carvedilol (COREG) 3.125 MG tablet TAKE 1 TABLET BY MOUTH TWICE DAILY WITH A MEAL   citalopram (CELEXA) 40 MG tablet Take 1 tablet (40 mg total) by mouth daily.   clobetasol cream (TEMOVATE) [id number] % Apply 1 application. topically 2 (two) times daily.   clonazePAM (KLONOPIN) 1 MG tablet TAKE 1 TABLET BY MOUTH EVERY MORNING, 1/2 TABLET EVERY AFTERNOON AND 1 TABLET EVERY NIGHT AT BEDTIME   diltiazem (CARDIZEM CD) 120 MG 24 hr capsule TAKE 1 CAPSULE BY MOUTH EVERY DAY   fenofibrate (TRICOR) 145 MG tablet Take 1 tablet (145 mg total) by mouth daily.   fluticasone furoate-vilanterol (BREO ELLIPTA) 100-25 MCG/ACT AEPB Inhale 1 puff into the lungs daily.   hydrOXYzine (ATARAX) 25 MG tablet Take 25 mg by mouth at bedtime.   ipratropium-albuterol (DUONEB) 0.5-2.5 (3) MG/3ML SOLN Take 3 mLs by nebulization every 6 (six) hours as needed (wheezing / shortness of breath).   levothyroxine (SYNTHROID) 175 MCG tablet Take 1 tablet (175 mcg total) by mouth daily.   lisinopril (ZESTRIL) 10 MG tablet TAKE 1 TABLET(10 MG) BY MOUTH DAILY   Misc. Devices (QUAD CANE/SMALL BASE) MISC 1 Device by Does not apply route daily.   rosuvastatin (CRESTOR) 10 MG tablet TAKE 1 TABLET(10 MG) BY MOUTH DAILY   Vitamin D, Ergocalciferol, (DRISDOL) 1.25 MG (50000 UNIT) CAPS capsule Take 50,000 Units by mouth 2 (two)  times a week.   No facility-administered encounter medications on file as of 04/27/2022.    Allergies (verified) Ciprofloxacin, Mucinex [guaifenesin er], Oxycodone, Oxycodone-acetaminophen, Oxycodone-acetaminophen, and Sulfa antibiotics   History: Past Medical History:  Diagnosis Date   Anxiety    Arthritis    Chronic back pain    COPD (chronic obstructive pulmonary disease) (HCC)    Depression    GERD (gastroesophageal reflux disease)    Hypertension    Hypothyroidism    Pneumonia    RECENT X 3   PONV (postoperative nausea and vomiting)    Psoriasis    Thyroid disease    Past Surgical History:  Procedure  Laterality Date   ABDOMINAL HYSTERECTOMY     heavy bleeding   bladder stimulator     CARPAL TUNNEL RELEASE     CATARACT EXTRACTION W/PHACO Right 03/07/2015   Procedure: CATARACT EXTRACTION PHACO AND INTRAOCULAR LENS PLACEMENT ([place]);  Surgeon: Estill Cotta, MD;  Location: [facility] ORS;  Service: Ophthalmology;  Laterality: Right;  [name] 01:14 AP% 26.4 CDE 33.04 fluid pack lot # [id number]   CATARACT EXTRACTION W/PHACO Left 03/21/2015   Procedure: CATARACT EXTRACTION PHACO AND INTRAOCULAR LENS PLACEMENT ([facility]);  Surgeon: Estill Cotta, MD;  Location: [facility] ORS;  Service: Ophthalmology;  Laterality: Left;  [name] 01:21 AP% 25.6 CDE 39.78 fluid pack lot # [id number]   HAND SURGERY Right    INTERSTIM IMPLANT REMOVAL N/A 09/07/2015   Procedure: REMOVAL OF INTERSTIM IMPLANT;  Surgeon: Bjorn Loser, MD;  Location: [facility] ORS;  Service: Urology;  Laterality: N/A;   OOPHORECTOMY Right    ovarian cyst   Family History  Problem Relation Age of Onset   Heart disease Mother    Breast cancer Mother    Heart attack Mother    COPD Father    Stroke Paternal Uncle    Kidney disease Sister    Breast cancer Cousin    Breast cancer Cousin    Heart attack Maternal Aunt    Stroke Paternal Grandfather    Kidney cancer Neg Hx    Bladder Cancer Neg Hx    Social History   Socioeconomic History   Marital status: Widowed    Spouse name: Not on file   Number of children: Not on file   Years of education: Not on file   Highest education level: Not on file  Occupational History   Occupation: retired  Tobacco Use   Smoking status: Former    Packs/day: 1.00    Years: 35.00    Additional pack years: 0.00    Total pack years: 35.00    Types: Cigarettes    Start date: 04/19/2018    Quit date: 11/16/2020    Years since quitting: 1.4   Smokeless tobacco: Never   Tobacco comments:    Previously smoked 2 ppd.   Vaping Use   Vaping Use: Never used  Substance and Sexual Activity   Alcohol use: No   Drug use:  No   Sexual activity: Not Currently    Birth control/protection: Surgical  Other Topics Concern   Not on file  Social History Narrative   Not on file   Social Determinants of Health   Financial Resource Strain: Low Risk  (04/27/2022)   Overall Financial Resource Strain (CARDIA)    Difficulty of Paying Living Expenses: Not hard at all  Food Insecurity: No Food Insecurity (04/27/2022)   Hunger Vital Sign    Worried About Running Out of Food in the Last Year: Never true  Ran Out of Food in the Last Year: Never true  Transportation Needs: No Transportation Needs (04/27/2022)   PRAPARE - Hydrologist (Medical): No    Lack of Transportation (Non-Medical): No  Physical Activity: Inactive (04/27/2022)   Exercise Vital Sign    Days of Exercise per Week: 0 days    Minutes of Exercise per Session: 0 min  Stress: No Stress Concern Present (04/27/2022)   [place]    Feeling of Stress : Only a little  Social Connections: Moderately Isolated (04/27/2022)   Social Connection and Isolation Panel [NHANES]    Frequency of Communication with Friends and Family: More than three times a week    Frequency of Social Gatherings with Friends and Family: Three times a week    Attends Religious Services: More than 4 times per year    Active Member of Clubs or Organizations: No    Attends Archivist Meetings: Never    Marital Status: Widowed    Tobacco Counseling Counseling given: Not Answered Tobacco comments: Previously smoked 2 ppd.    Clinical Intake:  Pre-visit preparation completed: Yes  Pain : No/denies pain     Nutritional Risks: None Diabetes: No  How often do you need to have someone help you when you read instructions, pamphlets, or other written materials from your doctor or pharmacy?: 1 - Never  Diabetic?yes Nutrition Risk Assessment:  Has the patient had any N/V/D within  the last 2 months?  No  Does the patient have any non-healing wounds?  No  Has the patient had any unintentional weight loss or weight gain?  No   Diabetes:  Is the patient diabetic?  Yes  If diabetic, was a CBG obtained today?  No  Did the patient bring in their glucometer from home?  No  How often do you monitor your CBG's? never.   Financial Strains and Diabetes Management:  Are you having any financial strains with the device, your supplies or your medication? No .  Does the patient want to be seen by Chronic Care Management for management of their diabetes?  No  Would the patient like to be referred to a Nutritionist or for Diabetic Management?  No   Diabetic Exams:  Diabetic Eye Exam: Completed no. Overdue for diabetic eye exam. Pt has been advised about the importance in completing this exam.  Diabetic Foot Exam: Completed 03/05/22. Pt has been advised about the importance in completing this exam.   Interpreter Needed?: No  Information entered by :: Kirke Shaggy, LPN   Activities of Daily Living    04/27/2022    8:51 AM 04/26/2022    1:40 PM  In your present state of health, do you have any difficulty performing the following activities:  Hearing? 1 1  Vision? 1 1  Difficulty concentrating or making decisions? 0 0  Walking or climbing stairs? 1 1  Dressing or bathing? 0 0  Doing errands, shopping? 1 1  Preparing Food and eating ? N   Using the Toilet? N   In the past six months, have you accidently leaked urine? N   Do you have problems with loss of bowel control? N   Managing your Medications? N   Managing your Finances? N   Housekeeping or managing your Housekeeping? N     Patient Care Team: Jearld Fenton, NP as PCP - General (Internal Medicine) Estill Cotta, MD as Consulting Physician (Ophthalmology)  Ree Edman, MD as Consulting Physician (Dermatology) Corey Skains, MD as Consulting Physician (Cardiology) Minor, Dalbert Garnet, RN (Inactive)  as Case Manager  Indicate any recent Medical Services you may have received from other than [facility] providers in the past year (date may be approximate).     Assessment:   This is a routine wellness examination for Jesselle.  Hearing/Vision screen Hearing Screening - Comments:: No aids Vision Screening - Comments:: [facility]- [facility]  Dietary issues and exercise activities discussed: Current Exercise Habits: The patient does not participate in regular exercise at present   Goals Addressed             This Visit's Progress    Cut out extra servings         Depression Screen    04/27/2022    8:48 AM 04/26/2022    1:40 PM 03/05/2022   12:32 PM 07/25/2021   10:18 AM 04/21/2021    9:06 AM 04/20/2021    2:50 PM 04/20/2021    2:49 PM  PHQ 2/9 Scores  PHQ - 2 Score 1 3 2  0 0 1 2  PHQ- 9 Score 3 5 9 1 2 3 2     Fall Risk    04/27/2022    8:50 AM 04/26/2022    1:40 PM 03/05/2022   12:32 PM 04/21/2021    9:09 AM 04/20/2021    2:49 PM  [place] in the past year? 0 0 0 0 0  Number falls in past yr: 0   0 0  Injury with Fall? 0 0 0 0 0  Risk for fall due to : Impaired mobility Impaired balance/gait No Fall Risks No Fall Risks No Fall Risks  Follow up Falls prevention discussed;Falls evaluation completed   Falls evaluation completed Falls evaluation completed    FALL RISK PREVENTION PERTAINING TO THE HOME:  Any stairs in or around the home? No  If so, are there any without handrails? No  Home free of loose throw rugs in walkways, pet beds, electrical cords, etc? Yes  Adequate lighting in your home to reduce risk of falls? Yes   ASSISTIVE DEVICES UTILIZED TO PREVENT FALLS:  Life alert? No  Use of a cane, walker or w/c? Yes - cane when goes out, walker in house Grab bars in the bathroom? No  Shower chair or bench in shower? No  Elevated toilet seat or a handicapped toilet? No    Cognitive Function:        04/27/2022    8:59 AM 04/12/2020    9:10 AM 05/14/2017   10:13  AM 05/07/2016    1:47 PM  6CIT Screen  What Year? 0 points 0 points 0 points 0 points  What month? 0 points 0 points 0 points 0 points  What time? 0 points 0 points 0 points 0 points  Count back from 20 0 points 0 points 0 points 0 points  Months in reverse 0 points 0 points 0 points 0 points  Repeat phrase 0 points 2 points 0 points 4 points  Total Score 0 points 2 points 0 points 4 points    Immunizations Immunization History  Administered Date(s) Administered   Fluad Quad(high Dose 65+) 12/03/2019   Influenza, High Dose Seasonal PF 01/07/2015, 11/14/2015, 11/06/2016, 11/20/2017   Influenza, Seasonal, Injecte, Preservative Fre 10/27/2008, 12/13/2010, 11/05/2012   Influenza,inj,Quad PF,6+ Mos 10/26/2013   Influenza-Unspecified 10/06/2013   Pneumococcal Conjugate-13 05/14/2017   Pneumococcal Polysaccharide-23 11/25/2013, 04/08/2019  TDAP status: Due, Education has been provided regarding the importance of this vaccine. Advised may receive this vaccine at local pharmacy or Health Dept. Aware to provide a copy of the vaccination record if obtained from local pharmacy or Health Dept. Verbalized acceptance and understanding.  Flu Vaccine status: Declined, Education has been provided regarding the importance of this vaccine but patient still declined. Advised may receive this vaccine at local pharmacy or Health Dept. Aware to provide a copy of the vaccination record if obtained from local pharmacy or Health Dept. Verbalized acceptance and understanding.  Pneumococcal vaccine status: Up to date  Covid-19 vaccine status: Declined, Education has been provided regarding the importance of this vaccine but patient still declined. Advised may receive this vaccine at local pharmacy or Health Dept.or vaccine clinic. Aware to provide a copy of the vaccination record if obtained from local pharmacy or Health Dept. Verbalized acceptance and understanding.  Qualifies for Shingles Vaccine? Yes    Zostavax completed No   Shingrix Completed?: No.    Education has been provided regarding the importance of this vaccine. Patient has been advised to call insurance company to determine out of pocket expense if they have not yet received this vaccine. Advised may also receive vaccine at local pharmacy or Health Dept. Verbalized acceptance and understanding.  Screening Tests Health Maintenance  Topic Date Due   OPHTHALMOLOGY EXAM  Never done   DTaP/Tdap/Td (1 - Tdap) Never done   Zoster Vaccines- Shingrix (1 of 2) Never done   Fecal DNA (Cologuard)  Never done   MAMMOGRAM  08/31/2018   Lung Cancer Screening  12/07/2021   INFLUENZA VACCINE  05/06/2022 (Originally 09/05/2021)   COVID-19 Vaccine (1) 05/12/2022 (Originally 02/26/1955)   HEMOGLOBIN A1C  09/03/2022   Diabetic kidney evaluation - Urine ACR  03/06/2023   FOOT EXAM  03/06/2023   Diabetic kidney evaluation - eGFR measurement  04/26/2023   Medicare Annual Wellness (AWV)  04/27/2023   Pneumonia Vaccine 55+ Years old  Completed   DEXA SCAN  Completed   Hepatitis C Screening  Completed   HPV VACCINES  Aged Out    Health Maintenance  Health Maintenance Due  Topic Date Due   OPHTHALMOLOGY EXAM  Never done   DTaP/Tdap/Td (1 - Tdap) Never done   Zoster Vaccines- Shingrix (1 of 2) Never done   Fecal DNA (Cologuard)  Never done   MAMMOGRAM  08/31/2018   Lung Cancer Screening  12/07/2021    Declined referral for colonoscopy  Declined referral for mammogram and BDS   Lung Cancer Screening: (Low Dose CT Chest recommended if Age 29-80 years, 30 pack-year currently smoking OR have quit w/in 15years.) does qualify.   Lung Cancer Screening Referral: ordered 04/16/22  Additional Screening:  Hepatitis C Screening: does qualify; Completed 12/31/18  Vision Screening: Recommended annual ophthalmology exams for early detection of glaucoma and other disorders of the eye. Is the patient up to date with their annual eye exam?  Yes  Who  is the provider or what is the name of the office in which the patient attends annual eye exams? [place] If pt is not established with a provider, would they like to be referred to a provider to establish care? No .   Dental Screening: Recommended annual dental exams for proper oral hygiene  Community Resource Referral / Chronic Care Management: CRR required this visit?  No   CCM required this visit?  No      Plan:     I  have personally reviewed and noted the following in the patient's chart:   Medical and social history Use of alcohol, tobacco or illicit drugs  Current medications and supplements including opioid prescriptions. Patient is not currently taking opioid prescriptions. Functional ability and status Nutritional status Physical activity Advanced directives List of other physicians Hospitalizations, surgeries, and ER visits in previous 12 months Vitals Screenings to include cognitive, depression, and falls Referrals and appointments  In addition, I have reviewed and discussed with patient certain preventive protocols, quality metrics, and best practice recommendations. A written personalized care plan for preventive services as well as general preventive health recommendations were provided to patient.     Dionisio David, LPN   [id number]   Nurse Notes: none

## 2022-05-01 ENCOUNTER — Other Ambulatory Visit: Payer: Self-pay | Admitting: Internal Medicine

## 2022-05-01 DIAGNOSIS — F410 Panic disorder [episodic paroxysmal anxiety] without agoraphobia: Secondary | ICD-10-CM

## 2022-05-01 NOTE — Telephone Encounter (Signed)
Medication Refill - Medication: clonazePAM (KLONOPIN) 1 MG tablet   Has the patient contacted their pharmacy? Yes.     Preferred Pharmacy (with phone number or street name):  [facility] [id number], [place] Phone: [phone]  Fax: [phone]     Has the patient been seen for an appointment in the last year OR does the patient have an upcoming appointment? Yes.    Please assist patient further as she only has one day left

## 2022-05-02 ENCOUNTER — Telehealth: Payer: Self-pay | Admitting: Internal Medicine

## 2022-05-02 NOTE — Telephone Encounter (Signed)
Copied from [place] [phone]. Topic: General - Other >> May 02, 2022 10:22 AM Everette C wrote: Reason for CRM: Medication Refill - Medication: clonazePAM (KLONOPIN) 1 MG tablet [id number]  Has the patient contacted their pharmacy? Yes.   (Agent: If no, request that the patient contact the pharmacy for the refill. If patient does not wish to contact the pharmacy document the reason why and proceed with request.) (Agent: If yes, when and what did the pharmacy advise?)  Preferred Pharmacy (with phone number or street name): [facility] STORE [id number], [place] [address] Phone: [phone] Fax: [phone] Hours: Not open 24 hours   Has the patient been seen for an appointment in the last year OR does the patient have an upcoming appointment? Yes.    Agent: Please be advised that RX refills may take up to 3 business days. We ask that you follow-up with your pharmacy.

## 2022-05-02 NOTE — Telephone Encounter (Signed)
Duplicate request, already requested by pharmacy on 05/01/22, routed to provider 05/02/22 waiting approval.

## 2022-05-02 NOTE — Telephone Encounter (Signed)
Requested medication (s) are due for refill today:Yes  Requested medication (s) are on the active medication list: Yes  Last refill:  04/03/22  Future visit scheduled: Yes  Notes to clinic:  Unable to refill per protocol, cannot delegate.      Requested Prescriptions  Pending Prescriptions Disp Refills   clonazePAM (KLONOPIN) 1 MG tablet [Pharmacy Med Name: CLONAZEPAM 1MG  TABLETS] 75 tablet     Sig: TAKE 1 TABLET BY MOUTH EVERY MORNING, 1/2 TABLET EVERY AFTERNOON AND 1 TABLET EVERY NIGHT AT BEDTIME     Not Delegated - Psychiatry: Anxiolytics/Hypnotics 2 Failed - 05/01/2022  4:35 PM      Failed - This refill cannot be delegated      Failed - Urine Drug Screen completed in last 360 days      Passed - Patient is not pregnant      Passed - Valid encounter within last 6 months    Recent Outpatient Visits           6 days ago COPD exacerbation [facility])   [facility] [place], Coralie Keens, NP   1 month ago Mixed hyperlipidemia   [facility] [place], Coralie Keens, NP   9 months ago Acquired hypothyroidism   [facility] [place], Coralie Keens, NP   1 year ago Screening for colon cancer   [facility] [place], Coralie Keens, NP   1 year ago Benign essential HTN   [facility] [place], Coralie Keens, NP       Future Appointments             In 3 months Baity, Coralie Keens, NP [facility], [facility]

## 2022-05-02 NOTE — Telephone Encounter (Signed)
Requested by pharmacy on 04/30/25 and routed to provider on 05/02/22 for approval, duplicate request.

## 2022-05-10 ENCOUNTER — Other Ambulatory Visit: Payer: Self-pay | Admitting: Internal Medicine

## 2022-05-10 DIAGNOSIS — I10 Essential (primary) hypertension: Secondary | ICD-10-CM

## 2022-05-10 NOTE — Telephone Encounter (Signed)
Requested Prescriptions  Pending Prescriptions Disp Refills   diltiazem (CARDIZEM CD) 120 MG 24 hr capsule [Pharmacy Med Name: DILTIAZEM CD 120MG  CAPSULES (24 HR)] 90 capsule 0    Sig: TAKE 1 CAPSULE BY MOUTH EVERY DAY     Cardiovascular: Calcium Channel Blockers 3 Passed - 05/10/2022  9:28 AM      Passed - ALT in normal range and within 360 days    ALT  Date Value Ref Range Status  04/26/2022 23 6 - 29 U/L Final         Passed - AST in normal range and within 360 days    AST  Date Value Ref Range Status  04/26/2022 21 10 - 35 U/L Final         Passed - Cr in normal range and within 360 days    Creat  Date Value Ref Range Status  04/26/2022 0.91 0.60 - 1.00 mg/dL Final   Creatinine, Urine  Date Value Ref Range Status  03/05/2022 76 20 - 275 mg/dL Final         Passed - Last BP in normal range    BP Readings from Last 1 Encounters:  04/26/22 134/72         Passed - Last Heart Rate in normal range    Pulse Readings from Last 1 Encounters:  04/26/22 86         Passed - Valid encounter within last 6 months    Recent Outpatient Visits           2 weeks ago COPD exacerbation [facility])   [facility] [place], Coralie Keens, NP   2 months ago Mixed hyperlipidemia   [facility] [place], Coralie Keens, NP   9 months ago Acquired hypothyroidism   [facility] [place], Coralie Keens, NP   1 year ago Screening for colon cancer   [facility] [place], Coralie Keens, NP   1 year ago Benign essential HTN   [facility] [place], Coralie Keens, NP       Future Appointments             In 3 months Baity, Coralie Keens, NP [facility], [facility]

## 2022-05-29 ENCOUNTER — Other Ambulatory Visit: Payer: Self-pay | Admitting: Internal Medicine

## 2022-05-29 DIAGNOSIS — F410 Panic disorder [episodic paroxysmal anxiety] without agoraphobia: Secondary | ICD-10-CM

## 2022-05-29 NOTE — Telephone Encounter (Signed)
Requested medication (s) are due for refill today: due 06/03/22  Requested medication (s) are on the active medication list: yes    Last refill: 05/03/22 #75  0 refills  Future visit scheduled yes 08/22/22  Notes to clinic:Not delegated, please review. Thank you.  Requested Prescriptions  Pending Prescriptions Disp Refills   clonazePAM (KLONOPIN) 1 MG tablet [Pharmacy Med Name: CLONAZEPAM  TABLETS] 75 tablet     Sig: TAKE 1 TABLET BY MOUTH EVERY MORNING, 1/2 TABLET EVERY AFTERNOON AND 1 TABLET EVERY NIGHT AT BEDTIME     Not Delegated - Psychiatry: Anxiolytics/Hypnotics 2 Failed - 05/29/2022 10:24 AM      Failed - This refill cannot be delegated      Failed - Urine Drug Screen completed in last 360 days      Passed - Patient is not pregnant      Passed - Valid encounter within last 6 months    Recent Outpatient Visits           1 month ago COPD exacerbation [facility])   [place] [facility] [place], Salvadore Oxford, NP   2 months ago Mixed hyperlipidemia   [place] [facility] [place], Salvadore Oxford, NP   10 months ago Acquired hypothyroidism   [place] [facility] [place], Salvadore Oxford, NP   1 year ago Screening for colon cancer   [place] [facility] [place], Salvadore Oxford, NP   1 year ago Benign essential HTN   [place] [facility] [place], Salvadore Oxford, NP       Future Appointments             In 2 months Baity, Salvadore Oxford, NP [facility], [facility]

## 2022-06-01 ENCOUNTER — Telehealth: Payer: Self-pay | Admitting: Internal Medicine

## 2022-06-01 DIAGNOSIS — F410 Panic disorder [episodic paroxysmal anxiety] without agoraphobia: Secondary | ICD-10-CM

## 2022-06-01 MED ORDER — CLONAZEPAM 1 MG PO TABS: ORAL_TABLET | ORAL | 0 refills | Status: DC

## 2022-06-01 NOTE — Telephone Encounter (Signed)
Pt is calling in because [facility] doesn't have the clonazePAM (KLONOPIN) 1 MG tablet [[id number]] in stock. Pt is requesting the prescription be sent to [facility] [address] [place], [place], [place] [address]. Please follow up with pt.

## 2022-06-01 NOTE — Telephone Encounter (Signed)
RX sent to pharmacy as requested °

## 2022-06-01 NOTE — Addendum Note (Signed)
Addended by: Lorre Munroe on: 06/01/2022 11:18 AM   Modules accepted: Orders

## 2022-06-08 ENCOUNTER — Telehealth: Payer: Self-pay

## 2022-06-08 DIAGNOSIS — J432 Centrilobular emphysema: Secondary | ICD-10-CM

## 2022-06-08 NOTE — Telephone Encounter (Signed)
Copied from CRM [id number]. Topic: Referral - Request for Referral >> Jun 08, 2022 10:23 AM Dondra Prader A wrote: Has patient seen PCP for this complaint? Yes.   *If NO, is insurance requiring patient see PCP for this issue before PCP can refer them? Referral for which specialty: pulmonology Preferred provider/office: would like for PCP to refer pt Reason for referral: [facility] Nurse Practitioner states that pt has COPD and was wanting her referred to a Pulmonologist

## 2022-06-08 NOTE — Telephone Encounter (Signed)
Pt advised.  She stated that [facility] requested a new referral because she has not been seen since 2022.    Thanks,   -Vernona Rieger

## 2022-06-08 NOTE — Telephone Encounter (Signed)
She is already established with Dr. Meredeth Ide at [facility].  She needs to call their office and schedule a follow-up appointment.

## 2022-06-11 NOTE — Telephone Encounter (Signed)
Referral placed.

## 2022-06-11 NOTE — Addendum Note (Signed)
Addended by: Lorre Munroe on: 06/11/2022 08:44 AM   Modules accepted: Orders

## 2022-07-09 ENCOUNTER — Telehealth: Payer: Self-pay

## 2022-07-09 NOTE — Telephone Encounter (Unsigned)
Copied from CRM [id number]. Topic: Referral - Question >> Jul 09, 2022 12:43 PM Pincus Sanes wrote: Has patient seen PCP for this complaint? Yes.  Pls resend to [facility] sent 5/6 office did not receive per

## 2022-07-26 ENCOUNTER — Other Ambulatory Visit: Payer: Self-pay | Admitting: Internal Medicine

## 2022-07-26 DIAGNOSIS — F410 Panic disorder [episodic paroxysmal anxiety] without agoraphobia: Secondary | ICD-10-CM

## 2022-07-26 NOTE — Telephone Encounter (Signed)
Requested medication (s) are due for refill today: yes  Requested medication (s) are on the active medication list: yes    Last refill: 06/01/22  #75  0 refills  Future visit scheduled yes 08/22/22  Notes to clinic:Not delegated, please review.  Requested Prescriptions  Pending Prescriptions Disp Refills   clonazePAM (KLONOPIN) 1 MG tablet [Pharmacy Med Name: CLONAZEPAM 1MG  TABLETS] 75 tablet     Sig: TAKE 1 TABLET BY MOUTH EVERY MORNING, 1/2 TABLET EVERY AFTERNOON, AND 1 TABLET EVERY NIGHT AT BEDTIME.     Not Delegated - Psychiatry: Anxiolytics/Hypnotics 2 Failed - 07/26/2022  9:35 AM      Failed - This refill cannot be delegated      Failed - Urine Drug Screen completed in last 360 days      Passed - Patient is not pregnant      Passed - Valid encounter within last 6 months    Recent Outpatient Visits           3 months ago COPD exacerbation [facility])   [place] [facility] [place], Salvadore Oxford, NP   4 months ago Mixed hyperlipidemia   [place] [facility] [place], Salvadore Oxford, NP   1 year ago Acquired hypothyroidism   [facility] [place], Salvadore Oxford, NP   1 year ago Screening for colon cancer   [place] [facility] [place], Salvadore Oxford, NP   1 year ago Benign essential HTN   [place] [facility] [place], Salvadore Oxford, NP       Future Appointments             In 3 weeks Sam[place] Si, Salvadore Oxford, NP [facility], [facility]

## 2022-08-08 ENCOUNTER — Other Ambulatory Visit: Payer: Self-pay | Admitting: Internal Medicine

## 2022-08-08 DIAGNOSIS — I10 Essential (primary) hypertension: Secondary | ICD-10-CM

## 2022-08-08 NOTE — Telephone Encounter (Signed)
Requested Prescriptions  Pending Prescriptions Disp Refills   carvedilol (COREG) 3.125 MG tablet [Pharmacy Med Name: CARVEDILOL 3.125MG  TABLETS] 180 tablet 0    Sig: TAKE 1 TABLET BY MOUTH TWICE DAILY WITH A MEAL     Cardiovascular: Beta Blockers 3 Passed - 08/08/2022 10:02 AM      Passed - Cr in normal range and within 360 days    Creat  Date Value Ref Range Status  04/26/2022 0.91 0.60 - 1.00 mg/dL Final   Creatinine, Urine  Date Value Ref Range Status  03/05/2022 76 20 - 275 mg/dL Final         Passed - AST in normal range and within 360 days    AST  Date Value Ref Range Status  04/26/2022 21 10 - 35 U/L Final         Passed - ALT in normal range and within 360 days    ALT  Date Value Ref Range Status  04/26/2022 23 6 - 29 U/L Final         Passed - Last BP in normal range    BP Readings from Last 1 Encounters:  04/26/22 134/72         Passed - Last Heart Rate in normal range    Pulse Readings from Last 1 Encounters:  04/26/22 86         Passed - Valid encounter within last 6 months    Recent Outpatient Visits           3 months ago COPD exacerbation (HCC)   [place] [facility] [place], Salvadore Oxford, NP   5 months ago Mixed hyperlipidemia   [place] [facility] [place], [place], NP   1 year ago Acquired hypothyroidism   [place] [facility] [place], [place], NP   1 year ago Screening for colon cancer   [place] [facility] [place], Salvadore Oxford, NP   1 year ago Benign essential HTN   [place] [facility] [place], Salvadore Oxford, NP       Future Appointments             In 2 weeks Sam[place] Si, Salvadore Oxford, NP [place] [facility], [facility]             lisinopril (ZESTRIL) 10 MG tablet [Pharmacy Med Name: LISINOPRIL 10MG  TABLETS] 90 tablet 0    Sig: TAKE 1 TABLET(10 MG) BY MOUTH DAILY     Cardiovascular:  ACE Inhibitors Passed - 08/08/2022 10:02 AM      Passed - Cr in  normal range and within 180 days    Creat  Date Value Ref Range Status  04/26/2022 0.91 0.60 - 1.00 mg/dL Final   Creatinine, Urine  Date Value Ref Range Status  03/05/2022 76 20 - 275 mg/dL Final         Passed - K in normal range and within 180 days    Potassium  Date Value Ref Range Status  04/26/2022 4.7 3.5 - 5.3 mmol/L Final  02/02/2013 3.7 3.5 - 5.1 mmol/L Final         Passed - Patient is not pregnant      Passed - Last BP in normal range    BP Readings from Last 1 Encounters:  04/26/22 134/72         Passed - Valid encounter within last 6 months    Recent Outpatient Visits  3 months ago COPD exacerbation [facility])   [place] [facility] [place], Salvadore Oxford, NP   5 months ago Mixed hyperlipidemia   [facility] [place], Salvadore Oxford, NP   1 year ago Acquired hypothyroidism   [place] [facility] [place], Salvadore Oxford, NP   1 year ago Screening for colon cancer   [place] [facility] [place], Salvadore Oxford, NP   1 year ago Benign essential HTN   [place] [facility] [place], Salvadore Oxford, NP       Future Appointments             In 2 weeks Sampson Si, Salvadore Oxford, NP [place] [facility], [facility]             ASPIRIN LOW DOSE 81 MG tablet [Pharmacy Med Name: ASPIRIN 81MG  EC LOW DOSE  TABLETS] 90 tablet 0    Sig: TAKE 1 TABLET(81 MG) BY MOUTH DAILY. SWALLOW WHOLE     Analgesics:  NSAIDS - aspirin Passed - 08/08/2022 10:02 AM      Passed - Cr in normal range and within 360 days    Creat  Date Value Ref Range Status  04/26/2022 0.91 0.60 - 1.00 mg/dL Final   Creatinine, Urine  Date Value Ref Range Status  03/05/2022 76 20 - 275 mg/dL Final         Passed - eGFR is 10 or above and within 360 days    GFR, Est African American  Date Value Ref Range Status  12/03/2019 84 > OR = 60 mL/min/1.21m2 Final   GFR, Est Non African American  Date Value Ref Range Status   12/03/2019 72 > OR = 60 mL/min/1.52m2 Final   GFR, Estimated  Date Value Ref Range Status  04/15/2022 >60 >60 mL/min Final    Comment:    (NOTE) Calculated using the CKD-EPI Creatinine Equation (2021)    eGFR  Date Value Ref Range Status  04/26/2022 67 > OR = 60 mL/min/1.8m2 Final         Passed - Patient is not pregnant      Passed - Valid encounter within last 12 months    Recent Outpatient Visits           3 months ago COPD exacerbation (HCC)   [place] [facility] [place], Salvadore Oxford, NP   5 months ago Mixed hyperlipidemia   [place] [facility] [place], Salvadore Oxford, NP   1 year ago Acquired hypothyroidism   [place] [facility] [place], Salvadore Oxford, NP   1 year ago Screening for colon cancer   [place] [facility] [place], Salvadore Oxford, NP   1 year ago Benign essential HTN   [place] [facility] [place], Salvadore Oxford, NP       Future Appointments             In 2 weeks Sampson Si, Salvadore Oxford, NP [facility], [facility]             diltiazem (CARDIZEM CD) 120 MG 24 hr capsule [Pharmacy Med Name: DILTIAZEM CD 120MG  CAPSULES (24 HR)] 90 capsule 0    Sig: TAKE 1 CAPSULE BY MOUTH EVERY DAY     Cardiovascular: Calcium Channel Blockers 3 Passed - 08/08/2022 10:02 AM      Passed - ALT in normal range and within 360 days  ALT  Date Value Ref Range Status  04/26/2022 23 6 - 29 U/L Final         Passed - AST in normal range and within 360 days    AST  Date Value Ref Range Status  04/26/2022 21 10 - 35 U/L Final         Passed - Cr in normal range and within 360 days    Creat  Date Value Ref Range Status  04/26/2022 0.91 0.60 - 1.00 mg/dL Final   Creatinine, Urine  Date Value Ref Range Status  03/05/2022 76 20 - 275 mg/dL Final         Passed - Last BP in normal range    BP Readings from Last 1 Encounters:  04/26/22 134/72         Passed - Last Heart Rate in  normal range    Pulse Readings from Last 1 Encounters:  04/26/22 86         Passed - Valid encounter within last 6 months    Recent Outpatient Visits           3 months ago COPD exacerbation [facility])   [place] [place], Salvadore Oxford, NP   5 months ago Mixed hyperlipidemia   [place] [facility] [place], Salvadore Oxford, NP   1 year ago Acquired hypothyroidism   [place] [facility] [place], Salvadore Oxford, NP   1 year ago Screening for colon cancer   [place] [facility] [place], Salvadore Oxford, NP   1 year ago Benign essential HTN   [place] [facility] [place], Salvadore Oxford, NP       Future Appointments             In 2 weeks Sam[place] Si, Salvadore Oxford, NP [place] [facility], [facility]

## 2022-08-17 ENCOUNTER — Encounter: Payer: Self-pay | Admitting: Emergency Medicine

## 2022-08-22 ENCOUNTER — Encounter: Payer: PPO | Admitting: Internal Medicine

## 2022-08-24 ENCOUNTER — Other Ambulatory Visit: Payer: Self-pay | Admitting: Internal Medicine

## 2022-08-24 ENCOUNTER — Telehealth: Payer: Self-pay | Admitting: Internal Medicine

## 2022-08-24 DIAGNOSIS — F410 Panic disorder [episodic paroxysmal anxiety] without agoraphobia: Secondary | ICD-10-CM

## 2022-08-24 NOTE — Telephone Encounter (Signed)
Medication Refill - Medication: clonazePAM (KLONOPIN) 1 MG tablet [[id number]]   Has the patient contacted their pharmacy? Yes.     (Agent: If yes, when and what did the pharmacy advise?) Contact PCP   Preferred Pharmacy (with phone number or street name): [id number] - [place] - [address]   Has the patient been seen for an appointment in the last year OR does the patient have an upcoming appointment? Yes.    Agent: Please be advised that RX refills may take up to 3 business days. We ask that you follow-up with your pharmacy.

## 2022-08-24 NOTE — Telephone Encounter (Signed)
Already requested from the pharmacy in a refill encounter today, routed to the office for provider to review.

## 2022-08-24 NOTE — Telephone Encounter (Signed)
Requested medication (s) are due for refill today: Yes  Requested medication (s) are on the active medication list: Yes  Last refill:  07/26/22  Future visit scheduled: Yes  Notes to clinic:  Unable to refill per protocol, cannot delegate.      Requested Prescriptions  Pending Prescriptions Disp Refills   clonazePAM (KLONOPIN) 1 MG tablet [Pharmacy Med Name: CLONAZEPAM 1MG  TABLETS] 75 tablet     Sig: TAKE 1 TABLET BY MOUTH EVERY MORNING, 1/2 TABLET BY MOUTH EVERY AFTERNOON AND 1 TABLET EVERY NIGHT AT BEDTIME     Not Delegated - Psychiatry: Anxiolytics/Hypnotics 2 Failed - 08/24/2022 12:53 PM      Failed - This refill cannot be delegated      Failed - Urine Drug Screen completed in last 360 days      Passed - Patient is not pregnant      Passed - Valid encounter within last 6 months    Recent Outpatient Visits           4 months ago COPD exacerbation [facility])   [place] [facility] [place], Salvadore Oxford, NP   5 months ago Mixed hyperlipidemia   [place] [facility], Salvadore Oxford, NP   1 year ago Acquired hypothyroidism   [place] [facility] [place], Salvadore Oxford, NP   1 year ago Screening for colon cancer   [place] [facility] [place], Salvadore Oxford, NP   1 year ago Benign essential HTN   [place] [facility] [place], Salvadore Oxford, NP       Future Appointments             In 4 days [place], Salvadore Oxford, NP  [facility]

## 2022-08-28 ENCOUNTER — Ambulatory Visit (INDEPENDENT_AMBULATORY_CARE_PROVIDER_SITE_OTHER): Payer: PPO | Admitting: Internal Medicine

## 2022-08-28 ENCOUNTER — Telehealth: Payer: Self-pay

## 2022-08-28 ENCOUNTER — Encounter: Payer: Self-pay | Admitting: Internal Medicine

## 2022-08-28 VITALS — BP 134/68 | HR 68 | Ht 63.0 in | Wt 193.0 lb

## 2022-08-28 DIAGNOSIS — Z5181 Encounter for therapeutic drug level monitoring: Secondary | ICD-10-CM

## 2022-08-28 DIAGNOSIS — E039 Hypothyroidism, unspecified: Secondary | ICD-10-CM

## 2022-08-28 DIAGNOSIS — Z0001 Encounter for general adult medical examination with abnormal findings: Secondary | ICD-10-CM | POA: Diagnosis not present

## 2022-08-28 DIAGNOSIS — E1165 Type 2 diabetes mellitus with hyperglycemia: Secondary | ICD-10-CM | POA: Diagnosis not present

## 2022-08-28 DIAGNOSIS — R300 Dysuria: Secondary | ICD-10-CM

## 2022-08-28 DIAGNOSIS — E782 Mixed hyperlipidemia: Secondary | ICD-10-CM | POA: Diagnosis not present

## 2022-08-28 DIAGNOSIS — Z780 Asymptomatic menopausal state: Secondary | ICD-10-CM

## 2022-08-28 DIAGNOSIS — E6609 Other obesity due to excess calories: Secondary | ICD-10-CM

## 2022-08-28 DIAGNOSIS — Z6834 Body mass index (BMI) 34.0-34.9, adult: Secondary | ICD-10-CM

## 2022-08-28 DIAGNOSIS — Z1211 Encounter for screening for malignant neoplasm of colon: Secondary | ICD-10-CM

## 2022-08-28 DIAGNOSIS — Z1231 Encounter for screening mammogram for malignant neoplasm of breast: Secondary | ICD-10-CM

## 2022-08-28 LAB — POCT URINALYSIS DIPSTICK
Bilirubin, UA: NEGATIVE
Blood, UA: NEGATIVE
Glucose, UA: NEGATIVE
Ketones, UA: NEGATIVE
Leukocytes, UA: NEGATIVE
Nitrite, UA: NEGATIVE
Protein, UA: NEGATIVE
Spec Grav, UA: 1.02 (ref 1.010–1.025)
Urobilinogen, UA: 0.2 E.U./dL
pH, UA: 5 (ref 5.0–8.0)

## 2022-08-28 LAB — CBC: MPV: 10.9 fL (ref 7.5–12.5)

## 2022-08-28 NOTE — Assessment & Plan Note (Signed)
Encourage diet and exercise for weight loss 

## 2022-08-28 NOTE — Progress Notes (Signed)
Subjective:    Patient ID: [name], female    DOB: 02-23-1950, 72 y.o.   MRN: [id number]  HPI  Patient presents to clinic today for her annual exam.  Flu: 11/2021 Tetanus: >10 years ago COVID: Never Pneumovax: 04/2019 Prevnar: 05/2017 Shingrix: Never Pap smear: Hysterectomy Mammogram: 08/2016 Bone density: 09/2013 Colon screening: never Vision screening: as needed Dentist: as needed, dentures  Diet: She does eat meat. She consumes fruits and veggies. She tries to avoid fried foods. She drinks mostly water, soda, tea Exercise: None   Review of Systems     Past Medical History:  Diagnosis Date   Anxiety    Arthritis    Chronic back pain    COPD (chronic obstructive pulmonary disease) (HCC)    Depression    GERD (gastroesophageal reflux disease)    Hypertension    Hypothyroidism    Pneumonia    RECENT X 3   PONV (postoperative nausea and vomiting)    Psoriasis    Thyroid disease     Current Outpatient Medications  Medication Sig Dispense Refill   acetaminophen (TYLENOL) 500 MG tablet Take 500 mg by mouth every 6 (six) hours as needed for moderate pain.      albuterol (VENTOLIN HFA) 108 (90 Base) MCG/ACT inhaler INHALE 2 PUFFS BY MOUTH EVERY 6 HOURS AS NEEDED FOR WHEEZING OR SHORTNESS OF BREATH 8.5 g 2   aspirin EC (ASPIRIN LOW DOSE) 81 MG tablet TAKE 1 TABLET(81 MG) BY MOUTH DAILY. SWALLOW WHOLE 90 tablet 0   carvedilol (COREG) 3.125 MG tablet TAKE 1 TABLET BY MOUTH TWICE DAILY WITH A MEAL 180 tablet 0   citalopram (CELEXA) 40 MG tablet Take 1 tablet (40 mg total) by mouth daily. 90 tablet 1   clobetasol cream (TEMOVATE) 0.05 % Apply 1 application. topically 2 (two) times daily.     clonazePAM (KLONOPIN) 1 MG tablet TAKE 1 TABLET BY MOUTH EVERY MORNING, 1/2 TABLET BY MOUTH EVERY AFTERNOON AND 1 TABLET EVERY NIGHT AT BEDTIME 75 tablet 0   diltiazem (CARDIZEM CD) 120 MG 24 hr capsule TAKE 1 CAPSULE BY MOUTH EVERY DAY 90 capsule 0   fenofibrate (TRICOR) 145 MG  tablet Take 1 tablet (145 mg total) by mouth daily. 90 tablet 1   fluticasone furoate-vilanterol (BREO ELLIPTA) 100-25 MCG/ACT AEPB Inhale 1 puff into the lungs daily. 1 each 0   hydrOXYzine (ATARAX) 25 MG tablet Take 25 mg by mouth at bedtime.     ipratropium-albuterol (DUONEB) 0.5-2.5 (3) MG/3ML SOLN Take 3 mLs by nebulization every 6 (six) hours as needed (wheezing / shortness of breath). 60 mL 0   levothyroxine (SYNTHROID) 175 MCG tablet Take 1 tablet (175 mcg total) by mouth daily. 90 tablet 1   lisinopril (ZESTRIL) 10 MG tablet TAKE 1 TABLET(10 MG) BY MOUTH DAILY 90 tablet 0   Misc. Devices (QUAD CANE/SMALL BASE) MISC 1 Device by Does not apply route daily. 1 each 0   rosuvastatin (CRESTOR) 10 MG tablet TAKE 1 TABLET(10 MG) BY MOUTH DAILY 90 tablet 1   Vitamin D, Ergocalciferol, (DRISDOL) 1.25 MG (50000 UNIT) CAPS capsule Take 50,000 Units by mouth 2 (two) times a week.     No current facility-administered medications for this visit.    Allergies  Allergen Reactions   Ciprofloxacin Hives and Diarrhea   Mucinex [Guaifenesin Er] Nausea Only    Per pt   Oxycodone Diarrhea   Oxycodone-Acetaminophen Diarrhea   Oxycodone-Acetaminophen Diarrhea   Sulfa Antibiotics Rash  Throat, mouth Throat, mouth    Family History  Problem Relation Age of Onset   Heart disease Mother    Breast cancer Mother    Heart attack Mother    COPD Father    Stroke Paternal Uncle    Kidney disease Sister    Breast cancer Cousin    Breast cancer Cousin    Heart attack Maternal Aunt    Stroke Paternal Grandfather    Kidney cancer Neg Hx    Bladder Cancer Neg Hx     Social History   Socioeconomic History   Marital status: Widowed    Spouse name: Not on file   Number of children: Not on file   Years of education: Not on file   Highest education level: Not on file  Occupational History   Occupation: retired  Tobacco Use   Smoking status: Former    Current packs/day: 0.00    Average packs/day:  1 pack/day for 35.0 years (35.0 ttl pk-yrs)    Types: Cigarettes    Start date: 04/19/2018    Quit date: 11/16/2020    Years since quitting: 1.7   Smokeless tobacco: Never   Tobacco comments:    Previously smoked 2 ppd.   Vaping Use   Vaping status: Never Used  Substance and Sexual Activity   Alcohol use: No   Drug use: No   Sexual activity: Not Currently    Birth control/protection: Surgical  Other Topics Concern   Not on file  Social History Narrative   Not on file   Social Determinants of Health   Financial Resource Strain: Low Risk  (04/27/2022)   Overall Financial Resource Strain (CARDIA)    Difficulty of Paying Living Expenses: Not hard at all  Food Insecurity: No Food Insecurity (04/27/2022)   Hunger Vital Sign    Worried About Running Out of Food in the Last Year: Never true    Ran Out of Food in the Last Year: Never true  Transportation Needs: No Transportation Needs (04/27/2022)   PRAPARE - Transportation    Lack of Transportation (Medical): No    Lack of Transportation (Non-Medical): No  Physical Activity: Inactive (04/27/2022)   Exercise Vital Sign    Days of Exercise per Week: 0 days    Minutes of Exercise per Session: 0 min  Stress: No Stress Concern Present (04/27/2022)   [employer] of Occupational Health - Occupational Stress Questionnaire    Feeling of Stress : Only a little  Social Connections: Moderately Isolated (04/27/2022)   Social Connection and Isolation Panel [NHANES]    Frequency of Communication with Friends and Family: More than three times a week    Frequency of Social Gatherings with Friends and Family: Three times a week    Attends Religious Services: More than 4 times per year    Active Member of Clubs or Organizations: No    Attends Banker Meetings: Never    Marital Status: Widowed  Intimate Partner Violence: Not At Risk (04/27/2022)   Humiliation, Afraid, Rape, and Kick questionnaire    Fear of Current or Ex-Partner: No     Emotionally Abused: No    Physically Abused: No    Sexually Abused: No     Constitutional: Denies fever, malaise, fatigue, headache or abrupt weight changes.  HEENT: Denies eye pain, eye redness, ear pain, ringing in the ears, wax buildup, runny nose, nasal congestion, bloody nose, or sore throat. Respiratory: Denies difficulty breathing, shortness of breath, cough or sputum production.  Cardiovascular: Denies chest pain, chest tightness, palpitations or swelling in the hands or feet.  Gastrointestinal: Denies abdominal pain, bloating, constipation, diarrhea or blood in the stool.  GU: Pt reports dysuria. Denies urgency, frequency, burning sensation, blood in urine, odor or discharge. Musculoskeletal: Patient reports joint pain, right side low back pain.  Denies decrease in range of motion, difficulty with gait, muscle pain or joint swelling.  Skin: Denies redness, rashes, lesions or ulcercations.  Neurological: Patient reports insomnia.  Denies dizziness, difficulty with memory, difficulty with speech or problems with balance and coordination.  Psych: Patient has a history of anxiety and depression.  Denies SI/HI.  No other specific complaints in a complete review of systems (except as listed in HPI above).  Objective:   Physical Exam BP 134/68   Pulse 68   Ht 5\' 3"  (1.6 m)   Wt 193 lb (87.5 kg)   SpO2 95%   BMI 34.19 kg/m   Wt Readings from Last 3 Encounters:  04/27/22 197 lb (89.4 kg)  04/26/22 197 lb (89.4 kg)  04/12/22 201 lb 12.8 oz (91.5 kg)    General: Appears her stated age, obese, in NAD. Skin: Warm, dry and intact. Psoriasis noted of BLE.  No ulcerations noted. HEENT: Head: normal shape and size; Eyes: sclera white, no icterus, conjunctiva pink, PERRLA and EOMs intact;  Neck:  Neck supple, trachea midline. No masses, lumps or thyromegaly present.  Cardiovascular: Normal rate and rhythm. S1,S2 noted.  No murmur, rubs or gallops noted. No JVD or BLE edema. No  carotid bruits noted. Pulmonary/Chest: Normal effort and positive vesicular breath sounds. No respiratory distress. No wheezes, rales or ronchi noted.  Abdomen: Soft and nontender. Normal bowel sounds.  Musculoskeletal: Strength 5/5 BUE/BLE. No difficulty with gait.  Neurological: Alert and oriented. Cranial nerves II-XII grossly intact. Coordination normal.  Psychiatric: Mood and affect normal. Behavior is normal. Judgment and thought content normal.     BMET    Component Value Date/Time   NA 144 04/26/2022 1338   NA 142 06/24/2015 1102   NA 135 (L) 02/02/2013 1021   K 4.7 04/26/2022 1338   K 3.7 02/02/2013 1021   CL 99 04/26/2022 1338   CL 103 02/02/2013 1021   CO2 33 (H) 04/26/2022 1338   CO2 30 02/02/2013 1021   GLUCOSE 130 (H) 04/26/2022 1338   GLUCOSE 152 (H) 02/02/2013 1021   BUN 18 04/26/2022 1338   BUN 12 06/24/2015 1102   BUN 8 02/02/2013 1021   CREATININE 0.91 04/26/2022 1338   CALCIUM 9.6 04/26/2022 1338   CALCIUM 9.9 02/02/2013 1021   GFRNONAA >60 04/15/2022 0600   GFRNONAA 72 12/03/2019 0947   GFRAA 84 12/03/2019 0947    Lipid Panel     Component Value Date/Time   CHOL 107 03/05/2022 1150   CHOL 205 (H) 02/03/2015 1022   TRIG 188 (H) 03/05/2022 1150   HDL 40 (L) 03/05/2022 1150   HDL 36 (L) 02/03/2015 1022   CHOLHDL 2.7 03/05/2022 1150   LDLCALC 41 03/05/2022 1150    CBC    Component Value Date/Time   WBC 9.7 04/26/2022 1338   RBC 4.92 04/26/2022 1338   HGB 14.5 04/26/2022 1338   HGB 15.6 06/24/2015 1102   HCT 45.7 (H) 04/26/2022 1338   HCT 46.6 06/24/2015 1102   PLT 199 04/26/2022 1338   PLT 307 06/24/2015 1102   MCV 92.9 04/26/2022 1338   MCV 93 06/24/2015 1102   MCV 90 02/02/2013 1021  MCH 29.5 04/26/2022 1338   MCHC 31.7 (L) 04/26/2022 1338   RDW 11.4 04/26/2022 1338   RDW 13.3 06/24/2015 1102   RDW 13.2 02/02/2013 1021   LYMPHSABS 2.4 04/12/2022 1630   LYMPHSABS 2.3 06/24/2015 1102   MONOABS 1.1 (H) 04/12/2022 1630   EOSABS 0.5  04/12/2022 1630   EOSABS 0.3 06/24/2015 1102   BASOSABS 0.0 04/12/2022 1630   BASOSABS 0.1 06/24/2015 1102    Hgb A1C Lab Results  Component Value Date   HGBA1C 6.3 03/05/2022           Assessment & Plan:   Preventative health maintenance:  Encouraged her to get a flu shot in the fall She declines tetanus for financial reasons, advised if she gets bit or cut to go get this done Encouraged her to get her COVID-vaccine Pneumovax and Prevnar 13 UTD Discussed Shingrix vaccine, she will check coverage with her insurance company and schedule visit if she would like to have this done She no longer wants to screen for cervical cancer Mammogram and bone density ordered-she will call to schedule Cologuard ordered-encouraged her to complete this Encouraged her to consume a balanced diet and exercise regimen Advised her to see an eye doctor and dentist annually We will check CBC, c-Met, TSH, free T4, lipid, A1c today  Dysuria:  Urinalysis: negative Push fluids   RTC in 6 months, follow-up chronic conditions Nicki Reaper, NP

## 2022-08-28 NOTE — Telephone Encounter (Signed)
-----   Message from [facility] sent at 08/28/2022  3:40 PM EDT ----- No evidence of infection on recent urinalysis

## 2022-08-28 NOTE — Telephone Encounter (Signed)
Patient aware of results.

## 2022-08-28 NOTE — Patient Instructions (Signed)
Health Maintenance for Postmenopausal Women Menopause is a normal process in which your ability to get pregnant comes to an end. This process happens slowly over many months or years, usually between the ages of 48 and 55. Menopause is complete when you have missed your menstrual period for 12 months. It is important to talk with your health care provider about some of the most common conditions that affect women after menopause (postmenopausal women). These include heart disease, cancer, and bone loss (osteoporosis). Adopting a healthy lifestyle and getting preventive care can help to promote your health and wellness. The actions you take can also lower your chances of developing some of these common conditions. What are the signs and symptoms of menopause? During menopause, you may have the following symptoms: Hot flashes. These can be moderate or severe. Night sweats. Decrease in sex drive. Mood swings. Headaches. Tiredness (fatigue). Irritability. Memory problems. Problems falling asleep or staying asleep. Talk with your health care provider about treatment options for your symptoms. Do I need hormone replacement therapy? Hormone replacement therapy is effective in treating symptoms that are caused by menopause, such as hot flashes and night sweats. Hormone replacement carries certain risks, especially as you become older. If you are thinking about using estrogen or estrogen with progestin, discuss the benefits and risks with your health care provider. How can I reduce my risk for heart disease and stroke? The risk of heart disease, heart attack, and stroke increases as you age. One of the causes may be a change in the body's hormones during menopause. This can affect how your body uses dietary fats, triglycerides, and cholesterol. Heart attack and stroke are medical emergencies. There are many things that you can do to help prevent heart disease and stroke. Watch your blood pressure High  blood pressure causes heart disease and increases the risk of stroke. This is more likely to develop in people who have high blood pressure readings or are overweight. Have your blood pressure checked: Every 3-5 years if you are 18-39 years of age. Every year if you are 40 years old or older. Eat a healthy diet  Eat a diet that includes plenty of vegetables, fruits, low-fat dairy products, and lean protein. Do not eat a lot of foods that are high in solid fats, added sugars, or sodium. Get regular exercise Get regular exercise. This is one of the most important things you can do for your health. Most adults should: Try to exercise for at least 150 minutes each week. The exercise should increase your heart rate and make you sweat (moderate-intensity exercise). Try to do strengthening exercises at least twice each week. Do these in addition to the moderate-intensity exercise. Spend less time sitting. Even light physical activity can be beneficial. Other tips Work with your health care provider to achieve or maintain a healthy weight. Do not use any products that contain nicotine or tobacco. These products include cigarettes, chewing tobacco, and vaping devices, such as e-cigarettes. If you need help quitting, ask your health care provider. Know your numbers. Ask your health care provider to check your cholesterol and your blood sugar (glucose). Continue to have your blood tested as directed by your health care provider. Do I need screening for cancer? Depending on your health history and family history, you may need to have cancer screenings at different stages of your life. This may include screening for: Breast cancer. Cervical cancer. Lung cancer. Colorectal cancer. What is my risk for osteoporosis? After menopause, you may be   at increased risk for osteoporosis. Osteoporosis is a condition in which bone destruction happens more quickly than new bone creation. To help prevent osteoporosis or  the bone fractures that can happen because of osteoporosis, you may take the following actions: If you are 19-50 years old, get at least 1,000 mg of calcium and at least 600 international units (IU) of vitamin D per day. If you are older than age 50 but younger than age 70, get at least 1,200 mg of calcium and at least 600 international units (IU) of vitamin D per day. If you are older than age 70, get at least 1,200 mg of calcium and at least 800 international units (IU) of vitamin D per day. Smoking and drinking excessive alcohol increase the risk of osteoporosis. Eat foods that are rich in calcium and vitamin D, and do weight-bearing exercises several times each week as directed by your health care provider. How does menopause affect my mental health? Depression may occur at any age, but it is more common as you become older. Common symptoms of depression include: Feeling depressed. Changes in sleep patterns. Changes in appetite or eating patterns. Feeling an overall lack of motivation or enjoyment of activities that you previously enjoyed. Frequent crying spells. Talk with your health care provider if you think that you are experiencing any of these symptoms. General instructions See your health care provider for regular wellness exams and vaccines. This may include: Scheduling regular health, dental, and eye exams. Getting and maintaining your vaccines. These include: Influenza vaccine. Get this vaccine each year before the flu season begins. Pneumonia vaccine. Shingles vaccine. Tetanus, diphtheria, and pertussis (Tdap) booster vaccine. Your health care provider may also recommend other immunizations. Tell your health care provider if you have ever been abused or do not feel safe at home. Summary Menopause is a normal process in which your ability to get pregnant comes to an end. This condition causes hot flashes, night sweats, decreased interest in sex, mood swings, headaches, or lack  of sleep. Treatment for this condition may include hormone replacement therapy. Take actions to keep yourself healthy, including exercising regularly, eating a healthy diet, watching your weight, and checking your blood pressure and blood sugar levels. Get screened for cancer and depression. Make sure that you are up to date with all your vaccines. This information is not intended to replace advice given to you by your health care provider. Make sure you discuss any questions you have with your health care provider. Document Revised: 06/13/2020 Document Reviewed: 06/13/2020 Elsevier Patient Education  2024 Elsevier Inc.  

## 2022-08-29 LAB — COMPLETE METABOLIC PANEL WITH GFR
ALT: 11 U/L (ref 6–29)
AST: 11 U/L (ref 10–35)
Albumin: 4 g/dL (ref 3.6–5.1)
Alkaline phosphatase (APISO): 77 U/L (ref 37–153)
BUN/Creatinine Ratio: 23 (calc) — ABNORMAL HIGH (ref 6–22)
BUN: 23 mg/dL (ref 7–25)
Calcium: 9.8 mg/dL (ref 8.6–10.4)
Creat: 1.02 mg/dL — ABNORMAL HIGH (ref 0.60–1.00)
Globulin: 2.5 g/dL (calc) (ref 1.9–3.7)
Glucose, Bld: 120 mg/dL (ref 65–139)
Sodium: 141 mmol/L (ref 135–146)

## 2022-08-29 LAB — LIPID PANEL
Cholesterol: 117 mg/dL (ref <200)
LDL Cholesterol (Calc): 48 mg/dL (calc)
Non-HDL Cholesterol (Calc): 75 mg/dL (calc) (ref <130)
Triglycerides: 197 mg/dL — ABNORMAL HIGH (ref <150)

## 2022-08-29 LAB — HEMOGLOBIN A1C
Hgb A1c MFr Bld: 6.9 % of total Hgb — ABNORMAL HIGH (ref <5.7)
Mean Plasma Glucose: 151 mg/dL

## 2022-08-29 LAB — CBC
HCT: 41.4 % (ref 35.0–45.0)
MCH: 29.3 pg (ref 27.0–33.0)
MCHC: 31.6 g/dL — ABNORMAL LOW (ref 32.0–36.0)
MCV: 92.6 fL (ref 80.0–100.0)
RDW: 11.3 % (ref 11.0–15.0)
WBC: 8.3 10*3/uL (ref 3.8–10.8)

## 2022-08-29 LAB — T4, FREE: Free T4: 2.1 ng/dL — ABNORMAL HIGH (ref 0.8–1.8)

## 2022-08-30 ENCOUNTER — Telehealth: Payer: Self-pay

## 2022-08-30 DIAGNOSIS — E039 Hypothyroidism, unspecified: Secondary | ICD-10-CM

## 2022-08-30 DIAGNOSIS — E782 Mixed hyperlipidemia: Secondary | ICD-10-CM

## 2022-08-30 LAB — HEMOGLOBIN A1C: eAG (mmol/L): 8.4 mmol/L

## 2022-08-30 LAB — LIPID PANEL
HDL: 42 mg/dL — ABNORMAL LOW (ref >=50)
Total CHOL/HDL Ratio: 2.8 (calc) (ref <5.0)

## 2022-08-30 LAB — DRUG MONITORING, PANEL 8 WITH CONFIRMATION, URINE
6 Acetylmorphine: NEGATIVE ng/mL (ref <10)
Alcohol Metabolites: NEGATIVE ng/mL (ref <500)
Alphahydroxyalprazolam: NEGATIVE ng/mL (ref <25)
Alphahydroxymidazolam: NEGATIVE ng/mL (ref <50)
Alphahydroxytriazolam: NEGATIVE ng/mL (ref <50)
Aminoclonazepam: 1145 ng/mL — ABNORMAL HIGH (ref <25)
Amphetamines: NEGATIVE ng/mL (ref <500)
Benzodiazepines: POSITIVE ng/mL — AB (ref <100)
Cocaine Metabolite: NEGATIVE ng/mL (ref <150)
Hydroxyethylflurazepam: NEGATIVE ng/mL (ref <50)
MDA: NEGATIVE ng/mL (ref <200)
MDMA: NEGATIVE ng/mL (ref <200)
MDMA: NEGATIVE ng/mL (ref <500)
Marijuana Metabolite: NEGATIVE ng/mL (ref <20)
Nordiazepam: NEGATIVE ng/mL (ref <50)
Opiates: NEGATIVE ng/mL (ref <100)
Oxazepam: NEGATIVE ng/mL (ref <50)
Oxidant: NEGATIVE ug/mL (ref <200)
Oxycodone: NEGATIVE ng/mL (ref <100)
pH: 6.5 (ref 4.5–9.0)

## 2022-08-30 LAB — COMPLETE METABOLIC PANEL WITH GFR
AG Ratio: 1.6 (calc) (ref 1.0–2.5)
CO2: 33 mmol/L — ABNORMAL HIGH (ref 20–32)
Chloride: 101 mmol/L (ref 98–110)
Potassium: 4.4 mmol/L (ref 3.5–5.3)
Total Bilirubin: 0.3 mg/dL (ref 0.2–1.2)
Total Protein: 6.5 g/dL (ref 6.1–8.1)
eGFR: 58 mL/min/{1.73_m2} — ABNORMAL LOW (ref >=60)

## 2022-08-30 LAB — CBC
Hemoglobin: 13.1 g/dL (ref 11.7–15.5)
Platelets: 250 10*3/uL (ref 140–400)
RBC: 4.47 10*6/uL (ref 3.80–5.10)

## 2022-08-30 LAB — TSH: TSH: 0.04 mIU/L — ABNORMAL LOW (ref 0.40–4.50)

## 2022-08-30 MED ORDER — LEVOTHYROXINE SODIUM 125 MCG PO TABS: 125.0000 ug | ORAL_TABLET | Freq: Every day | ORAL | 3 refills | Status: DC

## 2022-08-30 MED ORDER — ROSUVASTATIN CALCIUM 20 MG PO TABS: 20.0000 mg | ORAL_TABLET | Freq: Every day | ORAL | 3 refills | Status: DC

## 2022-08-30 NOTE — Telephone Encounter (Signed)
Patient aware of results and agrees to medication changes.  Prescriptions sent.

## 2022-08-30 NOTE — Telephone Encounter (Signed)
-----   Message from [facility] sent at 08/29/2022 12:05 PM EDT ----- Blood counts are normal.  Kidney function is slightly decreased.  It is important that she consume 48 to 64 ounces of water daily and avoid anti-inflammatories OTC.  This is something we will monitor.  Liver function is normal.  TSH is low which means she is taking too much thyroid medication.  We need to decrease her levothyroxine to 125 mcg daily.  Please let me know if she was agreeable with this and I will send this in.  She will need lab only appointment in 1 month to repeat TSH.  Cholesterol looks good but triglycerides remain elevated.  Would she be willing to increase rosuvastatin to 20 mg daily as well as continue fenofibrate?  Consume a low saturated fat diet.  A1c up slightly to 6.9%, she needs to decrease her carbohydrate and sugar intake to avoid having to start diabetic medication.

## 2022-08-30 NOTE — Addendum Note (Signed)
Addended by: Lorre Munroe on: 08/30/2022 11:02 AM   Modules accepted: Orders

## 2022-09-19 ENCOUNTER — Other Ambulatory Visit: Payer: Self-pay | Admitting: Internal Medicine

## 2022-09-19 DIAGNOSIS — F410 Panic disorder [episodic paroxysmal anxiety] without agoraphobia: Secondary | ICD-10-CM

## 2022-09-20 ENCOUNTER — Other Ambulatory Visit: Payer: Self-pay | Admitting: Internal Medicine

## 2022-09-20 DIAGNOSIS — F410 Panic disorder [episodic paroxysmal anxiety] without agoraphobia: Secondary | ICD-10-CM

## 2022-09-20 NOTE — Telephone Encounter (Signed)
Requested Prescriptions  Pending Prescriptions Disp Refills   levothyroxine (SYNTHROID) 175 MCG tablet [Pharmacy Med Name: LEVOTHYROXINE 0.175MG  ( ) TABS] 90 tablet 1    Sig: TAKE 1 TABLET(175 MCG) BY MOUTH DAILY     Endocrinology:  Hypothyroid Agents Failed - 09/19/2022 10:06 AM      Failed - TSH in normal range and within 360 days    TSH  Date Value Ref Range Status  08/28/2022 0.04 (L) 0.40 - 4.50 mIU/L Final         Passed - Valid encounter within last 12 months    Recent Outpatient Visits           3 weeks ago Encounter for general adult medical examination with abnormal findings   [place] [facility] [place], Salvadore Oxford, NP   4 months ago COPD exacerbation [facility])   [place] [facility] [place], Salvadore Oxford, NP   6 months ago Mixed hyperlipidemia   [place] [facility] [place], Salvadore Oxford, NP   1 year ago Acquired hypothyroidism   [place] [facility] [place], Salvadore Oxford, NP   1 year ago Screening for colon cancer   [place] [place], Salvadore Oxford, NP       Future Appointments             In 5 months Baity, Salvadore Oxford, NP [facility]             fenofibrate (TRICOR) 145 MG tablet [Pharmacy Med Name: FENOFIBRATE 145MG  TABLETS] 90 tablet 0    Sig: TAKE 1 TABLET(145 MG) BY MOUTH DAILY     Cardiovascular:  Antilipid - Fibric Acid Derivatives Failed - 09/19/2022 10:06 AM      Failed - Lipid Panel in normal range within the last 12 months    Cholesterol, Total  Date Value Ref Range Status  02/03/2015 205 (H) 100 - 199 mg/dL Final   Cholesterol  Date Value Ref Range Status  08/28/2022 117 <200 mg/dL Final   LDL Cholesterol (Calc)  Date Value Ref Range Status  08/28/2022 48 mg/dL (calc) Final    Comment:    Reference range: <100 . Desirable range <100 mg/dL for primary prevention;   <70 mg/dL for patients with CHD or diabetic patients  with >  or = 2 CHD risk factors. Marland Kitchen LDL-C is now calculated using the Martin-Hopkins  calculation, which is a validated novel method providing  better accuracy than the Friedewald equation in the  estimation of LDL-C.  Horald Pollen et al. Lenox Ahr. 6644;034(74): 2061-2068  (http://education.[facility].com/faq/FAQ164)    HDL  Date Value Ref Range Status  08/28/2022 42 (L) > OR = 50 mg/dL Final  25/95/6387 36 (L) >39 mg/dL Final   Triglycerides  Date Value Ref Range Status  08/28/2022 197 (H) <150 mg/dL Final         Passed - ALT in normal range and within 360 days    ALT  Date Value Ref Range Status  08/28/2022 11 6 - 29 U/L Final         Passed - AST in normal range and within 360 days    AST  Date Value Ref Range Status  08/28/2022 11 10 - 35 U/L Final         Passed - Cr in normal range and within 360 days    Creat  Date Value Ref Range Status  08/28/2022 1.02 (H) 0.60 -  1.00 mg/dL Final   Creatinine, Urine  Date Value Ref Range Status  03/05/2022 76 20 - 275 mg/dL Final         Passed - HGB in normal range and within 360 days    Hemoglobin  Date Value Ref Range Status  08/28/2022 13.1 11.7 - 15.5 g/dL Final  62/13/0865 78.4 11.1 - 15.9 g/dL Final         Passed - HCT in normal range and within 360 days    HCT  Date Value Ref Range Status  08/28/2022 41.4 35.0 - 45.0 % Final   Hematocrit  Date Value Ref Range Status  06/24/2015 46.6 34.0 - 46.6 % Final         Passed - PLT in normal range and within 360 days    Platelets  Date Value Ref Range Status  08/28/2022 250 140 - 400 Thousand/uL Final  06/24/2015 307 150 - 379 x10E3/uL Final         Passed - WBC in normal range and within 360 days    WBC  Date Value Ref Range Status  08/28/2022 8.3 3.8 - 10.8 Thousand/uL Final         Passed - eGFR is 30 or above and within 360 days    GFR, Est African American  Date Value Ref Range Status  12/03/2019 84 > OR = 60 mL/min/1.40m2 Final   GFR, Est Non African  American  Date Value Ref Range Status  12/03/2019 72 > OR = 60 mL/min/1.98m2 Final   GFR, Estimated  Date Value Ref Range Status  04/15/2022 >60 >60 mL/min Final    Comment:    (NOTE) Calculated using the CKD-EPI Creatinine Equation (2021)    eGFR  Date Value Ref Range Status  08/28/2022 58 (L) > OR = 60 mL/min/1.71m2 Final         Passed - Valid encounter within last 12 months    Recent Outpatient Visits           3 weeks ago Encounter for general adult medical examination with abnormal findings   [place] [facility] [place], Salvadore Oxford, NP   4 months ago COPD exacerbation [facility])   [facility] [place], Salvadore Oxford, NP   6 months ago Mixed hyperlipidemia   [place] [facility] [place], Salvadore Oxford, NP   1 year ago Acquired hypothyroidism   [facility] [place], Salvadore Oxford, NP   1 year ago Screening for colon cancer   [place] [facility] [place], Salvadore Oxford, NP       Future Appointments             In 5 months Baity, Salvadore Oxford, NP [facility], [facility]             citalopram (CELEXA) 40 MG tablet [Pharmacy Med Name: CITALOPRAM 40MG  TABLETS] 90 tablet 0    Sig: TAKE 1 TABLET(40 MG) BY MOUTH DAILY     Psychiatry:  Antidepressants - SSRI Passed - 09/19/2022 10:06 AM      Passed - Completed PHQ-2 or PHQ-9 in the last 360 days      Passed - Valid encounter within last 6 months    Recent Outpatient Visits           3 weeks ago Encounter for general adult medical examination with abnormal findings   [place] [facility] [place], [place]  W, NP   4 months ago COPD exacerbation [facility])   [place] [place], Salvadore Oxford, NP   6 months ago Mixed hyperlipidemia   [place] [facility] [place], Salvadore Oxford, NP   1 year ago Acquired hypothyroidism   [place] [facility] [place], Salvadore Oxford, NP    1 year ago Screening for colon cancer   [place] [facility] [place], Salvadore Oxford, NP       Future Appointments             In 5 months Baity, Salvadore Oxford, NP  [facility] [place], [facility]

## 2022-09-21 NOTE — Telephone Encounter (Signed)
Requested medication (s) are due for refill today: Clonazepam - yes  Requested medication (s) are on the active medication list: Yes  Last refill:  08/24/22  Future visit scheduled: Yes  Notes to clinic:  Clonazepam not delegated. Other medications refilled 09/20/22.    Requested Prescriptions  Pending Prescriptions Disp Refills   clonazePAM (KLONOPIN) 1 MG tablet [Pharmacy Med Name: CLONAZEPAM 1MG  TABLETS] 75 tablet     Sig: TAKE 1 TABLET BY MOUTH EVERY MORNING, 1/2 TABLET EVERY AFTERNOON, AND 1 TABLET EVERY NIGHT AT BEDTIME.     Not Delegated - Psychiatry: Anxiolytics/Hypnotics 2 Failed - 09/20/2022  8:23 AM      Failed - This refill cannot be delegated      Failed - Urine Drug Screen completed in last 360 days      Passed - Patient is not pregnant      Passed - Valid encounter within last 6 months    Recent Outpatient Visits           3 weeks ago Encounter for general adult medical examination with abnormal findings   [place] [facility] [place], Salvadore Oxford, NP   4 months ago [facility] exacerbation [facility])   [place] [facility] [place], Salvadore Oxford, NP   6 months ago Mixed hyperlipidemia   [place] [facility] [place], Salvadore Oxford, NP   1 year ago Acquired hypothyroidism   [place] [facility] [place], Salvadore Oxford, NP   1 year ago Screening for colon cancer   [place] [facility] [place], Salvadore Oxford, NP       Future Appointments             In 5 months Baity, Salvadore Oxford, NP [place] [facility], [facility]             citalopram (CELEXA) 40 MG tablet [Pharmacy Med Name: CITALOPRAM 40MG  TABLETS] 90 tablet 1    Sig: TAKE 1 TABLET(40 MG) BY MOUTH DAILY     Psychiatry:  Antidepressants - SSRI Passed - 09/20/2022  8:23 AM      Passed - Completed PHQ-2 or PHQ-9 in the last 360 days      Passed - Valid encounter within last 6 months    Recent Outpatient Visits           3 weeks ago  Encounter for general adult medical examination with abnormal findings   [place] [facility] [place], Salvadore Oxford, NP   4 months ago COPD exacerbation [facility])   [place] [facility] [place], Salvadore Oxford, NP   6 months ago Mixed hyperlipidemia   [place] [facility] [place], Salvadore Oxford, NP   1 year ago Acquired hypothyroidism   [place] [facility] [place], Salvadore Oxford, NP   1 year ago Screening for colon cancer   [place] [facility] [place], Salvadore Oxford, NP       Future Appointments             In 5 months Baity, Salvadore Oxford, NP  [facility], [facility]             fenofibrate (TRICOR) 145 MG tablet [Pharmacy Med Name: FENOFIBRATE 145MG  TABLETS] 90 tablet 1    Sig: TAKE 1 TABLET(145 MG) BY MOUTH DAILY     Cardiovascular:  Antilipid - Fibric Acid Derivatives Failed - 09/20/2022  8:23  AM      Failed - Lipid Panel in normal range within the last 12 months    Cholesterol, Total  Date Value Ref Range Status  02/03/2015 205 (H) 100 - 199 mg/dL Final   Cholesterol  Date Value Ref Range Status  08/28/2022 117 <200 mg/dL Final   LDL Cholesterol (Calc)  Date Value Ref Range Status  08/28/2022 48 mg/dL (calc) Final    Comment:    Reference range: <100 . Desirable range <100 mg/dL for primary prevention;   <70 mg/dL for patients with CHD or diabetic patients  with > or = 2 CHD risk factors. Marland Kitchen LDL-C is now calculated using the Martin-Hopkins  calculation, which is a validated novel method providing  better accuracy than the Friedewald equation in the  estimation of LDL-C.  Horald Pollen et al. Lenox Ahr. 4696;295(28): 2061-2068  (http://education.[facility].com/faq/FAQ164)    HDL  Date Value Ref Range Status  08/28/2022 42 (L) > OR = 50 mg/dL Final  41/32/4401 36 (L) >39 mg/dL Final   Triglycerides  Date Value Ref Range Status  08/28/2022 197 (H) <150 mg/dL Final         Passed -  ALT in normal range and within 360 days    ALT  Date Value Ref Range Status  08/28/2022 11 6 - 29 U/L Final         Passed - AST in normal range and within 360 days    AST  Date Value Ref Range Status  08/28/2022 11 10 - 35 U/L Final         Passed - Cr in normal range and within 360 days    Creat  Date Value Ref Range Status  08/28/2022 1.02 (H) 0.60 - 1.00 mg/dL Final   Creatinine, Urine  Date Value Ref Range Status  03/05/2022 76 20 - 275 mg/dL Final         Passed - HGB in normal range and within 360 days    Hemoglobin  Date Value Ref Range Status  08/28/2022 13.1 11.7 - 15.5 g/dL Final  02/72/5366 44.0 11.1 - 15.9 g/dL Final         Passed - HCT in normal range and within 360 days    HCT  Date Value Ref Range Status  08/28/2022 41.4 35.0 - 45.0 % Final   Hematocrit  Date Value Ref Range Status  06/24/2015 46.6 34.0 - 46.6 % Final         Passed - PLT in normal range and within 360 days    Platelets  Date Value Ref Range Status  08/28/2022 250 140 - 400 Thousand/uL Final  06/24/2015 307 150 - 379 x10E3/uL Final         Passed - WBC in normal range and within 360 days    WBC  Date Value Ref Range Status  08/28/2022 8.3 3.8 - 10.8 Thousand/uL Final         Passed - eGFR is 30 or above and within 360 days    GFR, Est African American  Date Value Ref Range Status  12/03/2019 84 > OR = 60 mL/min/1.102m2 Final   GFR, Est Non African American  Date Value Ref Range Status  12/03/2019 72 > OR = 60 mL/min/1.78m2 Final   GFR, Estimated  Date Value Ref Range Status  04/15/2022 >60 >60 mL/min Final    Comment:    (NOTE) Calculated using the CKD-EPI Creatinine Equation (2021)    eGFR  Date Value Ref Range Status  08/28/2022 58 (L) > OR = 60 mL/min/1.68m2 Final         Passed - Valid encounter within last 12 months    Recent Outpatient Visits           3 weeks ago Encounter for general adult medical examination with abnormal findings   [place]  [facility] [place], Salvadore Oxford, NP   4 months ago COPD exacerbation [facility])   [place] [facility] [place], Salvadore Oxford, NP   6 months ago Mixed hyperlipidemia   [place] [facility] [place], Salvadore Oxford, NP   1 year ago Acquired hypothyroidism   [place] [facility] [place], Salvadore Oxford, NP   1 year ago Screening for colon cancer   [place] [place], Salvadore Oxford, NP       Future Appointments             In 5 months Baity, Salvadore Oxford, NP [facility], [facility]

## 2022-10-01 ENCOUNTER — Other Ambulatory Visit: Payer: PPO

## 2022-10-01 DIAGNOSIS — E039 Hypothyroidism, unspecified: Secondary | ICD-10-CM

## 2022-10-02 LAB — TSH: TSH: 0.08 m[IU]/L — ABNORMAL LOW (ref 0.40–4.50)

## 2022-10-03 ENCOUNTER — Other Ambulatory Visit: Payer: Self-pay

## 2022-10-03 NOTE — Telephone Encounter (Signed)
-----   Message from [facility] sent at 10/02/2022  7:39 AM EDT ----- TSH remains low.  I would like to decrease her levothyroxine to 100 mcg daily.  Please let me know if she is agreeable with this.

## 2022-10-03 NOTE — Telephone Encounter (Signed)
Pt advised she agreed to change to levothyroxine daily.   Thanks,   -Vernona Rieger

## 2022-10-04 MED ORDER — LEVOTHYROXINE SODIUM 100 MCG PO TABS: 100.0000 ug | ORAL_TABLET | Freq: Every day | ORAL | 1 refills | Status: DC

## 2022-10-18 ENCOUNTER — Other Ambulatory Visit: Payer: Self-pay | Admitting: Internal Medicine

## 2022-10-18 DIAGNOSIS — F410 Panic disorder [episodic paroxysmal anxiety] without agoraphobia: Secondary | ICD-10-CM

## 2022-10-18 NOTE — Telephone Encounter (Signed)
Requested medication (s) are due for refill today:   Provider to review  Requested medication (s) are on the active medication list:   Yes  Future visit scheduled:   Yes 02/28/2023   Last ordered: 09/21/2022 #75, 0 refills  Non delegated refill   Requested Prescriptions  Pending Prescriptions Disp Refills   clonazePAM (KLONOPIN) 1 MG tablet [Pharmacy Med Name: CLONAZEPAM 1MG  TABLETS] 75 tablet     Sig: TAKE 1 TABLET BY MOUTH EVERY MORNING, 1/2 TABLET EVERY AFTERNOON, AND 1 TABLET EVERY NIGHT AT BEDTIME.     Not Delegated - Psychiatry: Anxiolytics/Hypnotics 2 Failed - 10/18/2022  8:14 AM      Failed - This refill cannot be delegated      Failed - Urine Drug Screen completed in last 360 days      Passed - Patient is not pregnant      Passed - Valid encounter within last 6 months    Recent Outpatient Visits           1 month ago Encounter for general adult medical examination with abnormal findings   [place] [facility] [place], Salvadore Oxford, NP   5 months ago COPD exacerbation [facility])   [place] [facility] [place], Salvadore Oxford, NP   7 months ago Mixed hyperlipidemia   [place] [place], Salvadore Oxford, NP   1 year ago Acquired hypothyroidism   [place] [facility] [place], Salvadore Oxford, NP   1 year ago Screening for colon cancer   [place] [facility] [place], Salvadore Oxford, NP       Future Appointments             In 4 months Baity, Salvadore Oxford, NP [facility], [facility]

## 2022-11-03 ENCOUNTER — Other Ambulatory Visit: Payer: Self-pay | Admitting: Internal Medicine

## 2022-11-03 DIAGNOSIS — I10 Essential (primary) hypertension: Secondary | ICD-10-CM

## 2022-11-05 NOTE — Telephone Encounter (Signed)
Requested Prescriptions  Pending Prescriptions Disp Refills   lisinopril (ZESTRIL) 10 MG tablet [Pharmacy Med Name: LISINOPRIL 10MG  TABLETS] 90 tablet 0    Sig: TAKE 1 TABLET(10 MG) BY MOUTH DAILY     Cardiovascular:  ACE Inhibitors Passed - 11/03/2022  6:27 AM      Passed - Cr in normal range and within 180 days    Creat  Date Value Ref Range Status  08/28/2022 1.02 (H) 0.60 - 1.00 mg/dL Final   Creatinine, Urine  Date Value Ref Range Status  03/05/2022 76 20 - 275 mg/dL Final         Passed - K in normal range and within 180 days    Potassium  Date Value Ref Range Status  08/28/2022 4.4 3.5 - 5.3 mmol/L Final  02/02/2013 3.7 3.5 - 5.1 mmol/L Final         Passed - Patient is not pregnant      Passed - Last BP in normal range    BP Readings from Last 1 Encounters:  08/28/22 134/68         Passed - Valid encounter within last 6 months    Recent Outpatient Visits           2 months ago Encounter for general adult medical examination with abnormal findings   [place] [facility] [place], [place] W, NP   6 months ago COPD exacerbation [facility])   [facility] [place], Salvadore Oxford, NP   8 months ago Mixed hyperlipidemia   [place] [facility] [place], [place] W, NP   1 year ago Acquired hypothyroidism   [place] [facility] [place], [place] W, NP   1 year ago Screening for colon cancer   [place] [facility] [place], Salvadore Oxford, NP       Future Appointments             In 3 months Baity, Salvadore Oxford, NP  [facility]             carvedilol (COREG) 3.125 MG tablet [Pharmacy Med Name: CARVEDILOL 3.125MG  TABLETS] 180 tablet 0    Sig: TAKE 1 TABLET BY MOUTH TWICE DAILY WITH A MEAL     Cardiovascular: Beta Blockers 3 Passed - 11/03/2022  6:27 AM      Passed - Cr in normal range and within 360 days    Creat  Date Value Ref Range Status  08/28/2022 1.02 (H)  0.60 - 1.00 mg/dL Final   Creatinine, Urine  Date Value Ref Range Status  03/05/2022 76 20 - 275 mg/dL Final         Passed - AST in normal range and within 360 days    AST  Date Value Ref Range Status  08/28/2022 11 10 - 35 U/L Final         Passed - ALT in normal range and within 360 days    ALT  Date Value Ref Range Status  08/28/2022 11 6 - 29 U/L Final         Passed - Last BP in normal range    BP Readings from Last 1 Encounters:  08/28/22 134/68         Passed - Last Heart Rate in normal range    Pulse Readings from Last 1 Encounters:  08/28/22 68         Passed - Valid encounter within last 6  months    Recent Outpatient Visits           2 months ago Encounter for general adult medical examination with abnormal findings   [facility] [place], Salvadore Oxford, NP   6 months ago COPD exacerbation [facility])   [place] [facility] [place], Salvadore Oxford, NP   8 months ago Mixed hyperlipidemia   [facility] [place], [place] W, NP   1 year ago Acquired hypothyroidism   [place] [place], [place], NP   1 year ago Screening for colon cancer   [place] [facility] [place], Salvadore Oxford, NP       Future Appointments             In 3 months [place], Salvadore Oxford, NP [place] [facility]             diltiazem (CARDIZEM CD) 120 MG 24 hr capsule [Pharmacy Med Name: DILTIAZEM CD 120MG  CAPSULES (24 HR)] 90 capsule 0    Sig: TAKE 1 CAPSULE BY MOUTH EVERY DAY     Cardiovascular: Calcium Channel Blockers 3 Passed - 11/03/2022  6:27 AM      Passed - ALT in normal range and within 360 days    ALT  Date Value Ref Range Status  08/28/2022 11 6 - 29 U/L Final         Passed - AST in normal range and within 360 days    AST  Date Value Ref Range Status  08/28/2022 11 10 - 35 U/L Final         Passed - Cr in normal range and within 360 days    Creat   Date Value Ref Range Status  08/28/2022 1.02 (H) 0.60 - 1.00 mg/dL Final   Creatinine, Urine  Date Value Ref Range Status  03/05/2022 76 20 - 275 mg/dL Final         Passed - Last BP in normal range    BP Readings from Last 1 Encounters:  08/28/22 134/68         Passed - Last Heart Rate in normal range    Pulse Readings from Last 1 Encounters:  08/28/22 68         Passed - Valid encounter within last 6 months    Recent Outpatient Visits           2 months ago Encounter for general adult medical examination with abnormal findings   [facility] [place], Salvadore Oxford, NP   6 months ago COPD exacerbation [facility])   [place] [facility] [place], Salvadore Oxford, NP   8 months ago Mixed hyperlipidemia   [place] [facility] [employer], Salvadore Oxford, NP   1 year ago Acquired hypothyroidism   [place] [place], Salvadore Oxford, NP   1 year ago Screening for colon cancer   [place] [facility] [place], Salvadore Oxford, NP       Future Appointments             In 3 months Baity, Salvadore Oxford, NP  [facility]            Refused Prescriptions Disp Refills   levothyroxine (SYNTHROID) 200 MCG tablet [Pharmacy Med Name: LEVOTHYROXINE 0.2MG  ( ) TAB] 90 tablet     Sig: TAKE 1 TABLET(200  MCG) BY MOUTH DAILY     Endocrinology:  Hypothyroid Agents Failed - 11/03/2022  6:27 AM      Failed - TSH in normal range and within 360 days    TSH  Date Value Ref Range Status  10/01/2022 0.08 (L) 0.40 - 4.50 mIU/L Final         Passed - Valid encounter within last 12 months    Recent Outpatient Visits           2 months ago Encounter for general adult medical examination with abnormal findings   [place] [facility] [place], Salvadore Oxford, NP   6 months ago COPD exacerbation [facility])   [place] [facility] [place], Salvadore Oxford, NP   8 months ago Mixed  hyperlipidemia   [place] [facility] [place], Salvadore Oxford, NP   1 year ago Acquired hypothyroidism   [place] [facility], Salvad[facility] Oxford, NP   1 year ago Screening for colon cancer   [place] [facility] [place], Salvadore Oxford, NP       Future Appointments             In 3 months Baity, Salvadore Oxford, NP [place] [facility], [facility]

## 2022-11-07 ENCOUNTER — Other Ambulatory Visit: Payer: Self-pay | Admitting: Internal Medicine

## 2022-11-07 NOTE — Telephone Encounter (Signed)
Requested Prescriptions  Pending Prescriptions Disp Refills   ASPIRIN LOW DOSE 81 MG tablet [Pharmacy Med Name: ASPIRIN 81MG  EC LOW DOSETABLETS] 90 tablet 0    Sig: TAKE 1 TABLET(81 MG) BY MOUTH DAILY. SWALLOW WHOLE     Analgesics:  NSAIDS - aspirin Passed - 11/07/2022  9:12 AM      Passed - Cr in normal range and within 360 days    Creat  Date Value Ref Range Status  08/28/2022 1.02 (H) 0.60 - 1.00 mg/dL Final   Creatinine, Urine  Date Value Ref Range Status  03/05/2022 76 20 - 275 mg/dL Final         Passed - eGFR is 10 or above and within 360 days    GFR, Est African American  Date Value Ref Range Status  12/03/2019 84 > OR = 60 mL/min/1.21m2 Final   GFR, Est Non African American  Date Value Ref Range Status  12/03/2019 72 > OR = 60 mL/min/1.20m2 Final   GFR, Estimated  Date Value Ref Range Status  04/15/2022 >60 >60 mL/min Final    Comment:    (NOTE) Calculated using the CKD-EPI Creatinine Equation (2021)    eGFR  Date Value Ref Range Status  08/28/2022 58 (L) > OR = 60 mL/min/1.107m2 Final         Passed - Patient is not pregnant      Passed - Valid encounter within last 12 months    Recent Outpatient Visits           2 months ago Encounter for general adult medical examination with abnormal findings   [place] [facility] [place], Salvadore Oxford, NP   6 months ago COPD exacerbation [facility])   [place] [place], Salvadore Oxford, NP   8 months ago Mixed hyperlipidemia   [place] [facility] [place], Salvadore Oxford, NP   1 year ago Acquired hypothyroidism   [place] [facility] [place], Salvadore Oxford, NP   1 year ago Screening for colon cancer   [place] [facility] [place], Salvadore Oxford, NP       Future Appointments             In 3 months Baity, Salvadore Oxford, NP [place] [facility]

## 2022-11-15 ENCOUNTER — Other Ambulatory Visit: Payer: Self-pay | Admitting: Internal Medicine

## 2022-11-15 DIAGNOSIS — F410 Panic disorder [episodic paroxysmal anxiety] without agoraphobia: Secondary | ICD-10-CM

## 2022-11-15 NOTE — Telephone Encounter (Signed)
Requested medication (s) are due for refill today: yes  Requested medication (s) are on the active medication list: yes  Last refill:  10/18/22  Future visit scheduled: yes  Notes to clinic:  Unable to refill per protocol, cannot delegate.      Requested Prescriptions  Pending Prescriptions Disp Refills   clonazePAM (KLONOPIN) 1 MG tablet [Pharmacy Med Name: CLONAZEPAM 1MG  TABLETS] 75 tablet     Sig: TAKE 1 TABLET BY MOUTH EVERY MORNING, 1/2 TABLET EVERY AFTERNOON, AND 1 EVERY NIGHT AT BEDTIME     Not Delegated - Psychiatry: Anxiolytics/Hypnotics 2 Failed - 11/15/2022 10:46 AM      Failed - This refill cannot be delegated      Failed - Urine Drug Screen completed in last 360 days      Passed - Patient is not pregnant      Passed - Valid encounter within last 6 months    Recent Outpatient Visits           2 months ago Encounter for general adult medical examination with abnormal findings   [place] [facility] [place], Salvadore Oxford, NP   6 months ago COPD exacerbation [facility])   [place] [facility] [place], Salvadore Oxford, NP   8 months ago Mixed hyperlipidemia   [place] [facility] [place], Salvadore Oxford, NP   1 year ago Acquired hypothyroidism   [place] [facility] [place], Salvadore Oxford, NP   1 year ago Screening for colon cancer   [place] [facility] [place], Salvadore Oxford, NP       Future Appointments             In 3 months Baity, Salvadore Oxford, NP  [facility]

## 2022-11-21 ENCOUNTER — Other Ambulatory Visit: Payer: PPO

## 2022-12-13 ENCOUNTER — Other Ambulatory Visit: Payer: Self-pay | Admitting: Internal Medicine

## 2022-12-13 DIAGNOSIS — F410 Panic disorder [episodic paroxysmal anxiety] without agoraphobia: Secondary | ICD-10-CM

## 2022-12-13 NOTE — Telephone Encounter (Signed)
Requested medications are due for refill today.  yes  Requested medications are on the active medications list.  yes  Last refill. 11/15/2022 #75 0 rf  Future visit scheduled.   yes  Notes to clinic.  Refill not delegated.    Requested Prescriptions  Pending Prescriptions Disp Refills   clonazePAM (KLONOPIN) 1 MG tablet [Pharmacy Med Name: CLONAZEPAM 1MG  TABLETS] 75 tablet     Sig: TAKE 1 TABLET BY MOUTH EVERY MORNING, 1/2 TABLET EVERY AFTERNOON AND 1 TABLET EVERY NIGHT AT BEDTIME     Not Delegated - Psychiatry: Anxiolytics/Hypnotics 2 Failed - 12/13/2022  8:13 AM      Failed - This refill cannot be delegated      Failed - Urine Drug Screen completed in last 360 days      Passed - Patient is not pregnant      Passed - Valid encounter within last 6 months    Recent Outpatient Visits           3 months ago Encounter for general adult medical examination with abnormal findings   [place] [facility] [place] [place], Salvadore Oxford, NP   7 months ago COPD exacerbation [facility])   [place] [facility] [place], Salvadore Oxford, NP   9 months ago Mixed hyperlipidemia   [place] [facility] [place], Salvadore Oxford, NP   1 year ago Acquired hypothyroidism   [place] [facility] [place], Salvadore Oxford, NP   1 year ago Screening for colon cancer   [facility] [place], Salvadore Oxford, NP       Future Appointments             In 2 months Baity, Salvadore Oxford, NP [place] [facility]            Signed Prescriptions Disp Refills   citalopram (CELEXA) 40 MG tablet 90 tablet 0    Sig: TAKE 1 TABLET(40 MG) BY MOUTH DAILY     Psychiatry:  Antidepressants - SSRI Passed - 12/13/2022  8:13 AM      Passed - Completed PHQ-2 or PHQ-9 in the last 360 days      Passed - Valid encounter within last 6 months    Recent Outpatient Visits           3 months ago Encounter for general adult medical examination with  abnormal findings   [place] [facility] [place], Salvadore Oxford, NP   7 months ago COPD exacerbation [facility])   [place] [facility] [place], Salvadore Oxford, NP   9 months ago Mixed hyperlipidemia   [place] [facility] [place], Salvadore Oxford, NP   1 year ago Acquired hypothyroidism   [facility] [place], Salvadore Oxford, NP   1 year ago Screening for colon cancer   [place] [facility] [place], Salvadore Oxford, NP       Future Appointments             In 2 months Baity, Salvadore Oxford, NP [place] [facility], [facility]

## 2022-12-13 NOTE — Telephone Encounter (Signed)
Requested Prescriptions  Pending Prescriptions Disp Refills   citalopram (CELEXA) 40 MG tablet [Pharmacy Med Name: CITALOPRAM 40MG  TABLETS] 90 tablet 0    Sig: TAKE 1 TABLET(40 MG) BY MOUTH DAILY     Psychiatry:  Antidepressants - SSRI Passed - 12/13/2022  8:13 AM      Passed - Completed PHQ-2 or PHQ-9 in the last 360 days      Passed - Valid encounter within last 6 months    Recent Outpatient Visits           3 months ago Encounter for general adult medical examination with abnormal findings   [place] [facility] [place], Salvadore Oxford, NP   7 months ago COPD exacerbation [facility])   [place] [facility] [place], Salvadore Oxford, NP   9 months ago Mixed hyperlipidemia   [place] [facility] [place], Salvadore Oxford, NP   1 year ago Acquired hypothyroidism   [place] [facility] [place], Salvadore Oxford, NP   1 year ago Screening for colon cancer   [place] [facility] [place], Salvadore Oxford, NP       Future Appointments             In 2 months Baity, Salvadore Oxford, NP [place] [facility], [facility]             clonazePAM (KLONOPIN) 1 MG tablet [Pharmacy Med Name: CLONAZEPAM 1MG  TABLETS] 75 tablet     Sig: TAKE 1 TABLET BY MOUTH EVERY MORNING, 1/2 TABLET EVERY AFTERNOON AND 1 TABLET EVERY NIGHT AT BEDTIME     Not Delegated - Psychiatry: Anxiolytics/Hypnotics 2 Failed - 12/13/2022  8:13 AM      Failed - This refill cannot be delegated      Failed - Urine Drug Screen completed in last 360 days      Passed - Patient is not pregnant      Passed - Valid encounter within last 6 months    Recent Outpatient Visits           3 months ago Encounter for general adult medical examination with abnormal findings   [place] [facility] [place], Salvadore Oxford, NP   7 months ago COPD exacerbation [facility])   [place] [facility], Salvadore Oxford, NP   9 months ago Mixed hyperlipidemia   [facility] [place], Salvadore Oxford, NP   1 year ago Acquired hypothyroidism   [place] [facility] [place], Salvadore Oxford, NP   1 year ago Screening for colon cancer   [place] [facility] [place], Salvadore Oxford, NP       Future Appointments             In 2 months Baity, Salvadore Oxford, NP  [facility], [facility]

## 2022-12-19 ENCOUNTER — Emergency Department: Payer: PPO

## 2022-12-19 ENCOUNTER — Other Ambulatory Visit: Payer: Self-pay

## 2022-12-19 ENCOUNTER — Emergency Department
Admission: EM | Admit: 2022-12-19 | Discharge: 2022-12-19 | Disposition: A | Discharge status: Home / Self Care | Payer: PPO | Attending: Emergency Medicine | Admitting: Emergency Medicine

## 2022-12-19 ENCOUNTER — Encounter: Payer: Self-pay | Admitting: Emergency Medicine

## 2022-12-19 DIAGNOSIS — J449 Chronic obstructive pulmonary disease, unspecified: Secondary | ICD-10-CM | POA: Diagnosis not present

## 2022-12-19 DIAGNOSIS — M25552 Pain in left hip: Secondary | ICD-10-CM | POA: Insufficient documentation

## 2022-12-19 DIAGNOSIS — M79652 Pain in left thigh: Secondary | ICD-10-CM | POA: Insufficient documentation

## 2022-12-19 DIAGNOSIS — W010XXA Fall on same level from slipping, tripping and stumbling without subsequent striking against object, initial encounter: Secondary | ICD-10-CM | POA: Insufficient documentation

## 2022-12-19 MED ORDER — BENZONATATE 100 MG PO CAPS
100.0000 mg | ORAL_CAPSULE | Freq: Once | ORAL | Status: AC
  Administered 2022-12-19: 100 mg via ORAL
  Filled 2022-12-19: qty 1

## 2022-12-19 MED ORDER — HYDROCODONE-ACETAMINOPHEN 5-325 MG PO TABS
1.0000 | ORAL_TABLET | Freq: Once | ORAL | Status: AC
  Administered 2022-12-19: 1 via ORAL
  Filled 2022-12-19: qty 1

## 2022-12-19 MED ORDER — HYDROCODONE-ACETAMINOPHEN 5-325 MG PO TABS: 1.0000 | ORAL_TABLET | Freq: Four times a day (QID) | ORAL | 0 refills | Status: DC | PRN

## 2022-12-19 NOTE — ED Triage Notes (Signed)
Patient to ED via [facility] from home for Left hip and ankle pain. Started after a mechanical fall last week- pt unsure of which day. Denies hitting head or blood thinners. Has been able to ambulate at home with walker like normal. Has appointment scheduled with ortho on Tuesday.

## 2022-12-19 NOTE — ED Notes (Signed)
Patient changed into hospital gown for MRI. Patient belongings placed in belongings bag.

## 2022-12-19 NOTE — Discharge Instructions (Signed)
Please seek medical attention for any high fevers, chest pain, shortness of breath, change in behavior, persistent vomiting, bloody stool or any other new or concerning symptoms.  

## 2022-12-19 NOTE — ED Provider Notes (Signed)
[facility] Provider Note    Event Date/Time   First MD Initiated Contact with Patient 12/19/22 1332     (approximate)   History   Fall   HPI  [name] is a 72 y.o. female  copd, uses walker at baseline  Fall ~ 7 days ago.  Patient was at home, she leaned over to put water in her cats bowl when she stumbled forward landing on her left thigh and hip area.  She was able to crawl herself back up but has been able to stay at home, so less movement than typical because any movement causes pain over her left hip area.  She reports it sort of shoots from her hip area down towards her left knee and towards her left foot.  Denies that she has isolated pain in the ankle or knee, but states it is more like over her left hip points towards her left upper thigh region.  No head strike no neck injury.  Denies any injury to the chest abdomen pelvis.  Her breathing is normal.  She still been able to conduct daily living activities, but it is painful when she moves left hip.  She has been taking Aleve without relief  She does advise a history of allergy to oxycodone but reports she is taken hydrocodone in the past with good effect     Physical Exam   Triage Vital Signs: ED Triage Vitals [12/19/22 1259]  Encounter Vitals Group     BP 106/75     Systolic BP Percentile      Diastolic BP Percentile      Pulse Rate 77     Resp 18     Temp 98.9 F (37.2 C)     Temp Source Oral     SpO2 91 %     Weight 200 lb (90.7 kg)     Height 5\' 3"  (1.6 m)     Head Circumference      Peak Flow      Pain Score 10     Pain Loc      Pain Education      Exclude from Growth Chart     Most recent vital signs: Vitals:   12/19/22 1259  BP: 106/75  Pulse: 77  Resp: 18  Temp: 98.9 F (37.2 C)  SpO2: 91%     General: Awake, no distress.  Normocephalic atraumatic.  Pleasant CV:  Good peripheral perfusion.  Normal tones and rate Resp:  Normal effort.  Clear  bilateral Abd:  No distention.  Soft nontender Other:  Atraumatic right lower extremity.  Left lower extremity no obvious trauma to inspection.  Denies tenderness to palpation of the foot ankle shin left foot but approximately the mid femur up to the level of the left hip she isolates majority of pain over trochanteric region to palpation of the left hip.  There is no obvious bruising bleeding or open injury.  She reports mild discomfort in her lower back but advises that is chronic in nature and not new.  Warm well-perfused left foot.  Strong palpable dorsalis pedis and posterior tibial pulses.   ED Results / Procedures / Treatments   Labs (all labs ordered are listed, but only abnormal results are displayed) Labs Reviewed - No data to display   EKG     RADIOLOGY  X-ray of the left foot and ankle as well as the left hip interpreted by me as negative for obvious fracture.  Questionable if there might be subtle deformity along the left acetabular region, but no certain fracture.  Await radiology read (have been somewhat delayed recently due to staffing)  I have ordered MRI of the left hip     PROCEDURES:  Critical Care performed: No  Procedures   MEDICATIONS ORDERED IN ED: Medications  HYDROcodone-acetaminophen (NORCO/VICODIN) 5-325 MG per tablet 1 tablet (1 tablet Oral Given 12/19/22 1451)     IMPRESSION / MDM / ASSESSMENT AND PLAN / ED COURSE  I reviewed the triage vital signs and the nursing notes.                              Differential diagnosis includes, but is not limited to, injuries suffered from a fall, including possible subtle or nondisplaced hip fracture, pelvic fracture, or other injury to the left lower extremity.  Imaging of the left lower extremity as of 3 PM reassuring.  No obvious fractures to noted but given the location and localization of pain in the left hip joint region I have ordered MRI of the left hip to further evaluate and exclude fracture.   She otherwise denies any other acute illness she advises that she simply fell while feeding her cat which seems of a possible history.  There is no evidence of trauma or high risk for head trauma/use of anticoagulant.  Fully awake alert oriented with normal hemodynamics  Ongoing care assigned to oncoming partner Dr. Derrill Kay, at 320 pm.  Plan of care is to follow-up on pain reassessment after hydrocodone as well as pending x-ray of the left knee and MRI of the left hip.  If these are negative, patient pain well-controlled suspect likely able to return home with short course prescription for pain medication.  She does have in-home nursing that checks on her regularly as well, has been able to keep up with her activities of daily living  Patient's presentation is most consistent with acute complicated illness / injury requiring diagnostic workup.          FINAL CLINICAL IMPRESSION(S) / ED DIAGNOSES   Final diagnoses:  Left thigh pain     Rx / DC Orders   ED Discharge Orders     None        Note:  This document was prepared using Dragon voice recognition software and may include unintentional dictation errors.   Sharyn Creamer, MD 12/19/22 1536

## 2022-12-19 NOTE — ED Provider Notes (Signed)
MRI without concern for acute osseous abnormality. When I went to discuss this finding with the patient and family they stated they were also concerned about the patient having chest congestion. She has had pneumonias in the past. On my exam there was some diffuse wheezing but no rhonchi. CXR was obtained which did not show any pneumonia. Will plan on discharging.    Phineas Semen, MD 12/19/22 2121

## 2023-01-02 ENCOUNTER — Emergency Department: Payer: PPO

## 2023-01-02 ENCOUNTER — Other Ambulatory Visit: Payer: Self-pay

## 2023-01-02 ENCOUNTER — Inpatient Hospital Stay
Admission: EM | Admit: 2023-01-02 | Discharge: 2023-01-09 | DRG: 193 | Disposition: A | Discharge status: Skilled Nursing Facility | Payer: PPO | Attending: Student | Admitting: Student

## 2023-01-02 DIAGNOSIS — Z79890 Hormone replacement therapy (postmenopausal): Secondary | ICD-10-CM

## 2023-01-02 DIAGNOSIS — Z90710 Acquired absence of both cervix and uterus: Secondary | ICD-10-CM

## 2023-01-02 DIAGNOSIS — F410 Panic disorder [episodic paroxysmal anxiety] without agoraphobia: Secondary | ICD-10-CM

## 2023-01-02 DIAGNOSIS — J440 Chronic obstructive pulmonary disease with acute lower respiratory infection: Secondary | ICD-10-CM | POA: Diagnosis present

## 2023-01-02 DIAGNOSIS — J189 Pneumonia, unspecified organism: Secondary | ICD-10-CM

## 2023-01-02 DIAGNOSIS — N390 Urinary tract infection, site not specified: Secondary | ICD-10-CM

## 2023-01-02 DIAGNOSIS — Z882 Allergy status to sulfonamides status: Secondary | ICD-10-CM

## 2023-01-02 DIAGNOSIS — M549 Dorsalgia, unspecified: Secondary | ICD-10-CM | POA: Diagnosis present

## 2023-01-02 DIAGNOSIS — Z79899 Other long term (current) drug therapy: Secondary | ICD-10-CM

## 2023-01-02 DIAGNOSIS — B9620 Unspecified Escherichia coli [E. coli] as the cause of diseases classified elsewhere: Secondary | ICD-10-CM | POA: Diagnosis present

## 2023-01-02 DIAGNOSIS — K219 Gastro-esophageal reflux disease without esophagitis: Secondary | ICD-10-CM | POA: Diagnosis present

## 2023-01-02 DIAGNOSIS — L409 Psoriasis, unspecified: Secondary | ICD-10-CM | POA: Diagnosis present

## 2023-01-02 DIAGNOSIS — Z7951 Long term (current) use of inhaled steroids: Secondary | ICD-10-CM

## 2023-01-02 DIAGNOSIS — E119 Type 2 diabetes mellitus without complications: Secondary | ICD-10-CM

## 2023-01-02 DIAGNOSIS — I10 Essential (primary) hypertension: Secondary | ICD-10-CM | POA: Diagnosis present

## 2023-01-02 DIAGNOSIS — E860 Dehydration: Secondary | ICD-10-CM | POA: Diagnosis present

## 2023-01-02 DIAGNOSIS — Z1152 Encounter for screening for COVID-19: Secondary | ICD-10-CM

## 2023-01-02 DIAGNOSIS — N179 Acute kidney failure, unspecified: Secondary | ICD-10-CM

## 2023-01-02 DIAGNOSIS — J441 Chronic obstructive pulmonary disease with (acute) exacerbation: Secondary | ICD-10-CM | POA: Diagnosis present

## 2023-01-02 DIAGNOSIS — E66812 Obesity, class 2: Secondary | ICD-10-CM | POA: Diagnosis present

## 2023-01-02 DIAGNOSIS — Z841 Family history of disorders of kidney and ureter: Secondary | ICD-10-CM

## 2023-01-02 DIAGNOSIS — Z823 Family history of stroke: Secondary | ICD-10-CM

## 2023-01-02 DIAGNOSIS — Z8249 Family history of ischemic heart disease and other diseases of the circulatory system: Secondary | ICD-10-CM

## 2023-01-02 DIAGNOSIS — Z23 Encounter for immunization: Secondary | ICD-10-CM

## 2023-01-02 DIAGNOSIS — Z7982 Long term (current) use of aspirin: Secondary | ICD-10-CM

## 2023-01-02 DIAGNOSIS — M519 Unspecified thoracic, thoracolumbar and lumbosacral intervertebral disc disorder: Secondary | ICD-10-CM | POA: Diagnosis present

## 2023-01-02 DIAGNOSIS — Z825 Family history of asthma and other chronic lower respiratory diseases: Secondary | ICD-10-CM

## 2023-01-02 DIAGNOSIS — E039 Hypothyroidism, unspecified: Secondary | ICD-10-CM | POA: Diagnosis present

## 2023-01-02 DIAGNOSIS — E785 Hyperlipidemia, unspecified: Secondary | ICD-10-CM | POA: Diagnosis present

## 2023-01-02 DIAGNOSIS — Z87891 Personal history of nicotine dependence: Secondary | ICD-10-CM

## 2023-01-02 DIAGNOSIS — J9601 Acute respiratory failure with hypoxia: Principal | ICD-10-CM

## 2023-01-02 DIAGNOSIS — M1990 Unspecified osteoarthritis, unspecified site: Secondary | ICD-10-CM | POA: Diagnosis present

## 2023-01-02 DIAGNOSIS — F419 Anxiety disorder, unspecified: Secondary | ICD-10-CM | POA: Diagnosis present

## 2023-01-02 DIAGNOSIS — F32A Depression, unspecified: Secondary | ICD-10-CM | POA: Diagnosis present

## 2023-01-02 DIAGNOSIS — Z885 Allergy status to narcotic agent status: Secondary | ICD-10-CM

## 2023-01-02 DIAGNOSIS — G8929 Other chronic pain: Secondary | ICD-10-CM | POA: Diagnosis present

## 2023-01-02 DIAGNOSIS — J13 Pneumonia due to Streptococcus pneumoniae: Principal | ICD-10-CM | POA: Diagnosis present

## 2023-01-02 DIAGNOSIS — R296 Repeated falls: Secondary | ICD-10-CM | POA: Diagnosis present

## 2023-01-02 DIAGNOSIS — Z888 Allergy status to other drugs, medicaments and biological substances status: Secondary | ICD-10-CM

## 2023-01-02 DIAGNOSIS — E1151 Type 2 diabetes mellitus with diabetic peripheral angiopathy without gangrene: Secondary | ICD-10-CM | POA: Diagnosis present

## 2023-01-02 DIAGNOSIS — R509 Fever, unspecified: Secondary | ICD-10-CM | POA: Diagnosis not present

## 2023-01-02 DIAGNOSIS — Z881 Allergy status to other antibiotic agents status: Secondary | ICD-10-CM

## 2023-01-02 DIAGNOSIS — Z6835 Body mass index (BMI) 35.0-35.9, adult: Secondary | ICD-10-CM

## 2023-01-02 DIAGNOSIS — Z803 Family history of malignant neoplasm of breast: Secondary | ICD-10-CM

## 2023-01-02 LAB — COMPREHENSIVE METABOLIC PANEL
ALT: 14 U/L (ref 0–44)
AST: 29 U/L (ref 15–41)
Albumin: 3.1 g/dL — ABNORMAL LOW (ref 3.5–5.0)
Alkaline Phosphatase: 58 U/L (ref 38–126)
Anion gap: 10 (ref 5–15)
BUN: 29 mg/dL — ABNORMAL HIGH (ref 8–23)
CO2: 24 mmol/L (ref 22–32)
Calcium: 8.4 mg/dL — ABNORMAL LOW (ref 8.9–10.3)
Chloride: 97 mmol/L — ABNORMAL LOW (ref 98–111)
Creatinine, Ser: 1.36 mg/dL — ABNORMAL HIGH (ref 0.44–1.00)
GFR, Estimated: 41 mL/min — ABNORMAL LOW (ref >60)
Glucose, Bld: 224 mg/dL — ABNORMAL HIGH (ref 70–99)
Potassium: 3.8 mmol/L (ref 3.5–5.1)
Sodium: 131 mmol/L — ABNORMAL LOW (ref 135–145)
Total Bilirubin: 0.9 mg/dL (ref <1.2)
Total Protein: 6.9 g/dL (ref 6.5–8.1)

## 2023-01-02 LAB — CBC WITH DIFFERENTIAL/PLATELET
Abs Immature Granulocytes: 0.04 10*3/uL (ref 0.00–0.07)
Basophils Absolute: 0.1 10*3/uL (ref 0.0–0.1)
Basophils Relative: 1 %
Eosinophils Absolute: 0.1 10*3/uL (ref 0.0–0.5)
Eosinophils Relative: 1 %
HCT: 38.2 % (ref 36.0–46.0)
Hemoglobin: 12.2 g/dL (ref 12.0–15.0)
Immature Granulocytes: 0 %
Lymphocytes Relative: 10 %
Lymphs Abs: 0.9 10*3/uL (ref 0.7–4.0)
MCH: 29.5 pg (ref 26.0–34.0)
MCHC: 31.9 g/dL (ref 30.0–36.0)
MCV: 92.3 fL (ref 80.0–100.0)
Monocytes Absolute: 0.9 10*3/uL (ref 0.1–1.0)
Monocytes Relative: 10 %
Neutro Abs: 7.3 10*3/uL (ref 1.7–7.7)
Neutrophils Relative %: 78 %
Platelets: 219 10*3/uL (ref 150–400)
RBC: 4.14 MIL/uL (ref 3.87–5.11)
RDW: 13.2 % (ref 11.5–15.5)
WBC: 9.2 10*3/uL (ref 4.0–10.5)
nRBC: 0 % (ref 0.0–0.2)

## 2023-01-02 LAB — PROTIME-INR
INR: 1.2 (ref 0.8–1.2)
Prothrombin Time: 15.1 s (ref 11.4–15.2)

## 2023-01-02 LAB — LACTIC ACID, PLASMA: Lactic Acid, Venous: 1.6 mmol/L (ref 0.5–1.9)

## 2023-01-02 NOTE — ED Triage Notes (Signed)
First Nurse Note;  Pt via [facility] from home. Pt c/o weakness. Pt has UTI symptoms and yeast infection under the L breast. Pt has a hx of COPD, EMS report rhonci and wheezing. EMS gave 1 neb, 4 mg of Zofran, and 125 mg of Solu-Medrol. Pt is A&Ox4 and NAD 99.8 oral  268 CBG with no hx of DM  85 HR  88% on RA, EMS reports placed on 2L [place]

## 2023-01-02 NOTE — ED Triage Notes (Addendum)
Pt. To ED via EMS for vomiting, fever x2 days, and cough/ congestion. Pt. States her home health RN saw pt. Today and gave her antibiotic for UTI. Pt. Alert and oriented in triage. Pt. Took 1000mg  tylenol at 1600 today.

## 2023-01-03 DIAGNOSIS — I10 Essential (primary) hypertension: Secondary | ICD-10-CM | POA: Diagnosis present

## 2023-01-03 DIAGNOSIS — E119 Type 2 diabetes mellitus without complications: Secondary | ICD-10-CM

## 2023-01-03 DIAGNOSIS — M519 Unspecified thoracic, thoracolumbar and lumbosacral intervertebral disc disorder: Secondary | ICD-10-CM | POA: Diagnosis present

## 2023-01-03 DIAGNOSIS — Z1152 Encounter for screening for COVID-19: Secondary | ICD-10-CM | POA: Diagnosis not present

## 2023-01-03 DIAGNOSIS — J9601 Acute respiratory failure with hypoxia: Secondary | ICD-10-CM | POA: Diagnosis present

## 2023-01-03 DIAGNOSIS — M1990 Unspecified osteoarthritis, unspecified site: Secondary | ICD-10-CM | POA: Diagnosis present

## 2023-01-03 DIAGNOSIS — J440 Chronic obstructive pulmonary disease with acute lower respiratory infection: Secondary | ICD-10-CM | POA: Diagnosis present

## 2023-01-03 DIAGNOSIS — F419 Anxiety disorder, unspecified: Secondary | ICD-10-CM

## 2023-01-03 DIAGNOSIS — N179 Acute kidney failure, unspecified: Secondary | ICD-10-CM | POA: Diagnosis present

## 2023-01-03 DIAGNOSIS — G8929 Other chronic pain: Secondary | ICD-10-CM | POA: Diagnosis present

## 2023-01-03 DIAGNOSIS — E785 Hyperlipidemia, unspecified: Secondary | ICD-10-CM | POA: Diagnosis present

## 2023-01-03 DIAGNOSIS — B9620 Unspecified Escherichia coli [E. coli] as the cause of diseases classified elsewhere: Secondary | ICD-10-CM | POA: Diagnosis present

## 2023-01-03 DIAGNOSIS — E860 Dehydration: Secondary | ICD-10-CM | POA: Diagnosis present

## 2023-01-03 DIAGNOSIS — E1151 Type 2 diabetes mellitus with diabetic peripheral angiopathy without gangrene: Secondary | ICD-10-CM | POA: Diagnosis present

## 2023-01-03 DIAGNOSIS — N390 Urinary tract infection, site not specified: Secondary | ICD-10-CM | POA: Diagnosis present

## 2023-01-03 DIAGNOSIS — K219 Gastro-esophageal reflux disease without esophagitis: Secondary | ICD-10-CM | POA: Diagnosis present

## 2023-01-03 DIAGNOSIS — Z6835 Body mass index (BMI) 35.0-35.9, adult: Secondary | ICD-10-CM | POA: Diagnosis not present

## 2023-01-03 DIAGNOSIS — E039 Hypothyroidism, unspecified: Secondary | ICD-10-CM

## 2023-01-03 DIAGNOSIS — F32A Depression, unspecified: Secondary | ICD-10-CM | POA: Diagnosis present

## 2023-01-03 DIAGNOSIS — R509 Fever, unspecified: Secondary | ICD-10-CM | POA: Diagnosis present

## 2023-01-03 DIAGNOSIS — N3001 Acute cystitis with hematuria: Secondary | ICD-10-CM

## 2023-01-03 DIAGNOSIS — J441 Chronic obstructive pulmonary disease with (acute) exacerbation: Secondary | ICD-10-CM | POA: Diagnosis present

## 2023-01-03 DIAGNOSIS — J189 Pneumonia, unspecified organism: Secondary | ICD-10-CM | POA: Diagnosis not present

## 2023-01-03 DIAGNOSIS — M549 Dorsalgia, unspecified: Secondary | ICD-10-CM | POA: Diagnosis present

## 2023-01-03 DIAGNOSIS — L409 Psoriasis, unspecified: Secondary | ICD-10-CM | POA: Diagnosis present

## 2023-01-03 DIAGNOSIS — J13 Pneumonia due to Streptococcus pneumoniae: Secondary | ICD-10-CM | POA: Diagnosis present

## 2023-01-03 DIAGNOSIS — Z23 Encounter for immunization: Secondary | ICD-10-CM | POA: Diagnosis present

## 2023-01-03 DIAGNOSIS — E66812 Obesity, class 2: Secondary | ICD-10-CM | POA: Diagnosis present

## 2023-01-03 LAB — RESPIRATORY PANEL BY PCR

## 2023-01-03 LAB — RESP PANEL BY RT-PCR (RSV, FLU A&B, COVID)  RVPGX2
Influenza A by PCR: NEGATIVE
Influenza B by PCR: NEGATIVE
Resp Syncytial Virus by PCR: NEGATIVE
SARS Coronavirus 2 by RT PCR: NEGATIVE

## 2023-01-03 LAB — URINALYSIS, COMPLETE (UACMP) WITH MICROSCOPIC
Bilirubin Urine: NEGATIVE
Glucose, UA: NEGATIVE mg/dL
Ketones, ur: NEGATIVE mg/dL
Nitrite: NEGATIVE
Protein, ur: 100 mg/dL — AB
Specific Gravity, Urine: 1.018 (ref 1.005–1.030)
WBC, UA: >50 WBC/hpf (ref 0–5)
pH: 5 (ref 5.0–8.0)

## 2023-01-03 LAB — CBG MONITORING, ED
Glucose-Capillary: 254 mg/dL — ABNORMAL HIGH (ref 70–99)
Glucose-Capillary: 223 mg/dL — ABNORMAL HIGH (ref 70–99)
Glucose-Capillary: 158 mg/dL — ABNORMAL HIGH (ref 70–99)

## 2023-01-03 LAB — PROCALCITONIN: Procalcitonin: 0.88 ng/mL

## 2023-01-03 LAB — LACTIC ACID, PLASMA
Lactic Acid, Venous: 1.9 mmol/L (ref 0.5–1.9)
Lactic Acid, Venous: 1.7 mmol/L (ref 0.5–1.9)

## 2023-01-03 MED ORDER — LEVOTHYROXINE SODIUM 100 MCG PO TABS
100.0000 ug | ORAL_TABLET | Freq: Every day | ORAL | Status: DC
  Administered 2023-01-04 – 2023-01-09 (×6): 100 ug via ORAL
  Filled 2023-01-03 (×3): qty 1
  Filled 2023-01-03: qty 2
  Filled 2023-01-03 (×2): qty 1

## 2023-01-03 MED ORDER — FLUTICASONE FUROATE-VILANTEROL 100-25 MCG/ACT IN AEPB
1.0000 | INHALATION_SPRAY | Freq: Every day | RESPIRATORY_TRACT | Status: DC
  Administered 2023-01-03 – 2023-01-09 (×7): 1 via RESPIRATORY_TRACT
  Filled 2023-01-03: qty 28

## 2023-01-03 MED ORDER — ACETAMINOPHEN 650 MG RE SUPP: 650.0000 mg | Freq: Four times a day (QID) | RECTAL | Status: DC | PRN

## 2023-01-03 MED ORDER — ENOXAPARIN SODIUM 60 MG/0.6ML IJ SOSY
45.0000 mg | PREFILLED_SYRINGE | INTRAMUSCULAR | Status: DC
  Administered 2023-01-03 – 2023-01-09 (×7): 45 mg via SUBCUTANEOUS
  Filled 2023-01-03 (×7): qty 0.6

## 2023-01-03 MED ORDER — ROSUVASTATIN CALCIUM 10 MG PO TABS
20.0000 mg | ORAL_TABLET | Freq: Every day | ORAL | Status: DC
  Administered 2023-01-03 – 2023-01-09 (×7): 20 mg via ORAL
  Filled 2023-01-03 (×4): qty 2
  Filled 2023-01-03 (×2): qty 1
  Filled 2023-01-03 (×2): qty 2

## 2023-01-03 MED ORDER — ACETAMINOPHEN 325 MG PO TABS
650.0000 mg | ORAL_TABLET | Freq: Four times a day (QID) | ORAL | Status: DC | PRN
  Administered 2023-01-03 – 2023-01-06 (×3): 650 mg via ORAL
  Filled 2023-01-03 (×3): qty 2

## 2023-01-03 MED ORDER — SODIUM CHLORIDE 0.9 % IV SOLN
1.0000 g | Freq: Once | INTRAVENOUS | Status: AC
  Administered 2023-01-03: 1 g via INTRAVENOUS
  Filled 2023-01-03: qty 10

## 2023-01-03 MED ORDER — ACETAMINOPHEN 500 MG PO TABS
1000.0000 mg | ORAL_TABLET | Freq: Once | ORAL | Status: DC
  Filled 2023-01-03: qty 2

## 2023-01-03 MED ORDER — DOXEPIN HCL 50 MG PO CAPS
50.0000 mg | ORAL_CAPSULE | Freq: Every day | ORAL | Status: DC
  Administered 2023-01-03 – 2023-01-08 (×6): 50 mg via ORAL
  Filled 2023-01-03 (×7): qty 1

## 2023-01-03 MED ORDER — CITALOPRAM HYDROBROMIDE 20 MG PO TABS
40.0000 mg | ORAL_TABLET | Freq: Every day | ORAL | Status: DC
  Administered 2023-01-03 – 2023-01-09 (×7): 40 mg via ORAL
  Filled 2023-01-03 (×7): qty 2

## 2023-01-03 MED ORDER — SODIUM CHLORIDE 0.9 % IV SOLN
2.0000 g | INTRAVENOUS | Status: AC
  Administered 2023-01-03 – 2023-01-06 (×4): 2 g via INTRAVENOUS
  Filled 2023-01-03 (×4): qty 20

## 2023-01-03 MED ORDER — PREDNISONE 20 MG PO TABS
40.0000 mg | ORAL_TABLET | Freq: Every day | ORAL | Status: AC
  Administered 2023-01-03 – 2023-01-07 (×5): 40 mg via ORAL
  Filled 2023-01-03 (×5): qty 2

## 2023-01-03 MED ORDER — SODIUM CHLORIDE 0.9 % IV SOLN
500.0000 mg | INTRAVENOUS | Status: DC
  Administered 2023-01-03: 500 mg via INTRAVENOUS
  Filled 2023-01-03: qty 5

## 2023-01-03 MED ORDER — FENOFIBRATE 160 MG PO TABS
160.0000 mg | ORAL_TABLET | Freq: Every day | ORAL | Status: DC
  Administered 2023-01-03 – 2023-01-09 (×7): 160 mg via ORAL
  Filled 2023-01-03 (×8): qty 1

## 2023-01-03 MED ORDER — ONDANSETRON HCL 4 MG/2ML IJ SOLN
4.0000 mg | Freq: Four times a day (QID) | INTRAMUSCULAR | Status: DC | PRN
  Administered 2023-01-09: 4 mg via INTRAVENOUS
  Filled 2023-01-03: qty 2

## 2023-01-03 MED ORDER — CLONAZEPAM 1 MG PO TABS
1.0000 mg | ORAL_TABLET | Freq: Three times a day (TID) | ORAL | Status: DC
  Administered 2023-01-03 – 2023-01-09 (×19): 1 mg via ORAL
  Filled 2023-01-03: qty 1
  Filled 2023-01-03: qty 2
  Filled 2023-01-03 (×10): qty 1
  Filled 2023-01-03: qty 2
  Filled 2023-01-03 (×6): qty 1

## 2023-01-03 MED ORDER — SODIUM CHLORIDE 0.9% FLUSH
3.0000 mL | Freq: Two times a day (BID) | INTRAVENOUS | Status: DC
  Administered 2023-01-03 – 2023-01-09 (×12): 3 mL via INTRAVENOUS

## 2023-01-03 MED ORDER — LACTATED RINGERS IV BOLUS
1000.0000 mL | Freq: Once | INTRAVENOUS | Status: AC
  Administered 2023-01-03: 1000 mL via INTRAVENOUS

## 2023-01-03 MED ORDER — PANTOPRAZOLE SODIUM 20 MG PO TBEC
20.0000 mg | DELAYED_RELEASE_TABLET | Freq: Every day | ORAL | Status: DC
  Administered 2023-01-03 – 2023-01-09 (×7): 20 mg via ORAL
  Filled 2023-01-03 (×8): qty 1

## 2023-01-03 MED ORDER — SODIUM CHLORIDE 0.9 % IV SOLN: 2.0000 g | INTRAVENOUS | Status: DC

## 2023-01-03 MED ORDER — ONDANSETRON HCL 4 MG PO TABS: 4.0000 mg | ORAL_TABLET | Freq: Four times a day (QID) | ORAL | Status: DC | PRN

## 2023-01-03 MED ORDER — ONDANSETRON HCL 4 MG/2ML IJ SOLN
4.0000 mg | INTRAMUSCULAR | Status: DC
  Filled 2023-01-03: qty 2

## 2023-01-03 MED ORDER — ASPIRIN 81 MG PO TBEC
81.0000 mg | DELAYED_RELEASE_TABLET | Freq: Every day | ORAL | Status: DC
  Administered 2023-01-03 – 2023-01-09 (×7): 81 mg via ORAL
  Filled 2023-01-03 (×7): qty 1

## 2023-01-03 MED ORDER — PREDNISONE 20 MG PO TABS: 40.0000 mg | ORAL_TABLET | Freq: Every day | ORAL | Status: DC

## 2023-01-03 MED ORDER — INSULIN ASPART 100 UNIT/ML IJ SOLN
0.0000 [IU] | Freq: Three times a day (TID) | INTRAMUSCULAR | Status: DC
  Administered 2023-01-03: 5 [IU] via SUBCUTANEOUS
  Administered 2023-01-03: 8 [IU] via SUBCUTANEOUS
  Administered 2023-01-04: 2 [IU] via SUBCUTANEOUS
  Administered 2023-01-04: 3 [IU] via SUBCUTANEOUS
  Administered 2023-01-04: 2 [IU] via SUBCUTANEOUS
  Administered 2023-01-05 – 2023-01-06 (×2): 5 [IU] via SUBCUTANEOUS
  Administered 2023-01-07: 8 [IU] via SUBCUTANEOUS
  Filled 2023-01-03 (×9): qty 1

## 2023-01-03 MED ORDER — SODIUM CHLORIDE 0.9 % IV SOLN
500.0000 mg | Freq: Once | INTRAVENOUS | Status: AC
  Administered 2023-01-03: 500 mg via INTRAVENOUS
  Filled 2023-01-03: qty 5

## 2023-01-03 MED ORDER — IPRATROPIUM-ALBUTEROL 0.5-2.5 (3) MG/3ML IN SOLN
3.0000 mL | Freq: Four times a day (QID) | RESPIRATORY_TRACT | Status: DC
  Administered 2023-01-03 – 2023-01-04 (×5): 3 mL via RESPIRATORY_TRACT
  Filled 2023-01-03 (×5): qty 3

## 2023-01-03 MED ORDER — LACTATED RINGERS IV SOLN: INTRAVENOUS | Status: AC

## 2023-01-03 NOTE — Assessment & Plan Note (Signed)
Patient endorsing dysuria and suprapubic abdominal pain.  Urinalysis consistent with UTI.  - Continue Rocephin - Urine culture pending

## 2023-01-03 NOTE — Assessment & Plan Note (Signed)
-   Continue home Synthroid

## 2023-01-03 NOTE — Assessment & Plan Note (Signed)
-   Home antihypertensives in the setting of borderline low blood pressure due to poor p.o. intake and dehydration

## 2023-01-03 NOTE — Assessment & Plan Note (Signed)
Patient presenting with increased nonproductive cough, shortness of breath and was found to be hypoxic.  Likely multifactorial in the setting of community-acquired pneumonia and COPD exacerbation  - Management of CAP and COPD as noted below - Continue supplemental oxygen to maintain oxygen saturation above 88% - Wean as tolerated

## 2023-01-03 NOTE — H&P (Addendum)
History and Physical    Patient: [name] YQM:[id number] DOB: 1950/06/02 DOA: 01/02/2023 DOS: the patient was seen and examined on 01/03/2023 PCP: Lorre Munroe, NP  Patient coming from: Home  Chief Complaint:  Chief Complaint  Patient presents with   Emesis    Pt. To ED via EMS for vomiting, fever x2 days, and cough/ congestion. Pt. States her home health RN saw pt. Today and gave her antibiotic for UTI. Pt. Alert and oriented in triage.   HPI: [name] is a 72 y.o. female with medical history significant of COPD, hypertension, hyperlipidemia, psoriasis, PAD with intermittent claudication, type 2 diabetes, hypothyroidism, GERD, who presents to the ED due to nausea and vomiting.  Mrs. Golay states that for the last few days, she has been experiencing fever, nausea with nonbilious vomiting, abdominal pain and pain with urination.  She notes that her abdominal symptoms started first, but then afterwards she began to experience a nonproductive cough with worsening shortness of breath.  She notes that this feels like her typical COPD.  She denies any chest pain or lower extremity swelling.  ED course: On arrival to the ED, patient was normotensive at 91/72 with heart rate of 82.  She was febrile at 102.8.  She was saturating at 92% on room air but subsequently desaturated requiring 2 L of supplemental oxygen.  Initial workup notable for normal CBC, glucose of 224, BUN 29, creatinine 1.36, with GFR 41.  Lactic acid normal x 2.  COVID-19, influenza and RSV PCR negative.  Urinalysis with moderate hematuria, large leukocytes, rare bacteria.  Chest x-ray was obtained that demonstrated mild right lower lobe infiltrate versus atelectasis.  Patient started on azithromycin, ceftriaxone, and IV fluids.  She had already received DuoNeb and Solu-Medrol with EMS.  [facility] contacted for admission.   Review of Systems: As mentioned in the history of present illness. All other systems reviewed and are  negative.  Past Medical History:  Diagnosis Date   Anxiety    Arthritis    Chronic back pain    COPD (chronic obstructive pulmonary disease) (HCC)    Depression    GERD (gastroesophageal reflux disease)    Hypertension    Hypothyroidism    Pneumonia    RECENT X 3   PONV (postoperative nausea and vomiting)    Psoriasis    Thyroid disease    Past Surgical History:  Procedure Laterality Date   ABDOMINAL HYSTERECTOMY     heavy bleeding   bladder stimulator     CARPAL TUNNEL RELEASE     CATARACT EXTRACTION W/PHACO Right 03/07/2015   Procedure: CATARACT EXTRACTION PHACO AND INTRAOCULAR LENS PLACEMENT (IOC);  Surgeon: Sallee Lange, MD;  Location: [facility] ORS;  Service: Ophthalmology;  Laterality: Right;  [name] 01:14 AP% 26.4 CDE 33.04 fluid pack lot # [id number]   CATARACT EXTRACTION W/PHACO Left 03/21/2015   Procedure: CATARACT EXTRACTION PHACO AND INTRAOCULAR LENS PLACEMENT (IOC);  Surgeon: Sallee Lange, MD;  Location: [facility] ORS;  Service: Ophthalmology;  Laterality: Left;  [name] 01:21 AP% 25.6 CDE 39.78 fluid pack lot # [id number]   HAND SURGERY Right    INTERSTIM IMPLANT REMOVAL N/A 09/07/2015   Procedure: REMOVAL OF INTERSTIM IMPLANT;  Surgeon: Alfredo Martinez, MD;  Location: [facility] ORS;  Service: Urology;  Laterality: N/A;   OOPHORECTOMY Right    ovarian cyst   Social History:  reports that she quit smoking about 2 years ago. Her smoking use included cigarettes. She started smoking about 4 years ago. She  has a 35 pack-year smoking history. She has never used smokeless tobacco. She reports that she does not drink alcohol and does not use drugs.  Allergies  Allergen Reactions   Ciprofloxacin Hives and Diarrhea   Mucinex [Guaifenesin Er] Nausea Only    Per pt   Oxycodone Diarrhea   Oxycodone-Acetaminophen Diarrhea   Oxycodone-Acetaminophen Diarrhea   Sulfa Antibiotics Rash    Throat, mouth Throat, mouth    Family History  Problem Relation Age of Onset   Heart disease  Mother    Breast cancer Mother    Heart attack Mother    COPD Father    Stroke Paternal Uncle    Kidney disease Sister    Breast cancer Cousin    Breast cancer Cousin    Heart attack Maternal Aunt    Stroke Paternal Grandfather    Kidney cancer Neg Hx    Bladder Cancer Neg Hx     Prior to Admission medications   Medication Sig Start Date End Date Taking? Authorizing Provider  acetaminophen (TYLENOL) 500 MG tablet Take 500 mg by mouth every 6 (six) hours as needed for moderate pain.     [provider]  albuterol (VENTOLIN HFA) 108 (90 Base) MCG/ACT inhaler INHALE 2 PUFFS BY MOUTH EVERY 6 HOURS AS NEEDED FOR WHEEZING OR SHORTNESS OF BREATH 04/16/22   Sunnie Nielsen, DO  aspirin EC (ASPIRIN LOW DOSE) 81 MG tablet TAKE 1 TABLET(81 MG) BY MOUTH DAILY. SWALLOW WHOLE 11/07/22   Lorre Munroe, NP  carvedilol (COREG) 3.125 MG tablet TAKE 1 TABLET BY MOUTH TWICE DAILY WITH A MEAL 11/05/22   Lorre Munroe, NP  citalopram (CELEXA) 40 MG tablet TAKE 1 TABLET(40 MG) BY MOUTH DAILY 12/13/22   Lorre Munroe, NP  clobetasol cream (TEMOVATE) 0.05 % Apply 1 application. topically 2 (two) times daily. 01/24/21   [provider]  clonazePAM (KLONOPIN) 1 MG tablet TAKE 1 TABLET BY MOUTH EVERY MORNING, 1/2 TABLET EVERY AFTERNOON AND 1 TABLET EVERY NIGHT AT BEDTIME 12/14/22   Lorre Munroe, NP  diltiazem (CARDIZEM CD) 120 MG 24 hr capsule TAKE 1 CAPSULE BY MOUTH EVERY DAY 11/05/22   Lorre Munroe, NP  fenofibrate (TRICOR) 145 MG tablet TAKE 1 TABLET(145 MG) BY MOUTH DAILY 09/20/22   Lorre Munroe, NP  fluticasone furoate-vilanterol (BREO ELLIPTA) 100-25 MCG/ACT AEPB Inhale 1 puff into the lungs daily. 04/16/22   Sunnie Nielsen, DO  HYDROcodone-acetaminophen (NORCO/VICODIN) 5-325 MG tablet Take 1 tablet by mouth every 6 (six) hours as needed for severe pain (pain score 7-10). 12/19/22 12/19/23  Phineas Semen, MD  hydrOXYzine (ATARAX) 25 MG tablet Take 25 mg by mouth at bedtime.  01/24/21   [provider]  ipratropium-albuterol (DUONEB) 0.5-2.5 (3) MG/3ML SOLN Take 3 mLs by nebulization every 6 (six) hours as needed (wheezing / shortness of breath). 04/16/22   Sunnie Nielsen, DO  levothyroxine (SYNTHROID) 100 MCG tablet Take 1 tablet (100 mcg total) by mouth daily. 10/04/22   Lorre Munroe, NP  lisinopril (ZESTRIL) 10 MG tablet TAKE 1 TABLET(10 MG) BY MOUTH DAILY 11/05/22   Lorre Munroe, NP  Misc. Devices (QUAD CANE/SMALL BASE) MISC 1 Device by Does not apply route daily. 06/26/17   Galen Manila, NP  rosuvastatin (CRESTOR) 20 MG tablet Take 1 tablet (20 mg total) by mouth daily. 08/30/22   Lorre Munroe, NP  Vitamin D, Ergocalciferol, (DRISDOL) 1.25 MG (50000 UNIT) CAPS capsule Take 50,000 Units by mouth 2 (two) times  a week. 03/16/21   [provider]    Physical Exam: Vitals:   01/03/23 0700 01/03/23 1033 01/03/23 1045 01/03/23 1108  BP: (!) 82/72  (!) 107/48   Pulse: 65  66   Resp: 18  19   Temp:  97.6 F (36.4 C)    TempSrc:  Oral    SpO2:    100%  Weight:      Height:       Physical Exam Vitals and nursing note reviewed.  Constitutional:      General: She is not in acute distress.    Appearance: She is obese.  HENT:     Head: Normocephalic and atraumatic.  Eyes:     Conjunctiva/sclera: Conjunctivae normal.     Pupils: Pupils are equal, round, and reactive to light.  Cardiovascular:     Rate and Rhythm: Normal rate and regular rhythm.     Heart sounds: No murmur heard. Pulmonary:     Effort: Pulmonary effort is normal. No respiratory distress.     Breath sounds: Wheezing (Throughout) and rhonchi (Throughout) present.  Abdominal:     General: Bowel sounds are normal. There is no distension.     Palpations: Abdomen is soft.     Tenderness: There is abdominal tenderness (Suprapubic). There is no guarding.  Musculoskeletal:     Right lower leg: No edema.     Left lower leg: No edema.  Skin:    General: Skin is warm  and dry.  Neurological:     General: No focal deficit present.     Mental Status: She is alert and oriented to person, place, and time. Mental status is at baseline.  Psychiatric:        Mood and Affect: Mood normal.        Behavior: Behavior normal.    Data Reviewed: CBC with WBC of 9.2, hemoglobin of 12.2, and platelets of 219 CMP with sodium of 131, potassium 3.8, bicarb 24, glucose 224, BUN 29, creat 1.36, AST 29, ALT 14 and GFR 41 Lactic acid 1.6 and then 1.9 Urinalysis with moderate hematuria, large leukocytes, rare bacteria, hyaline casts, mucus and WBC clumps COVID #19, RSV and influenza PCR negative  DG Chest 2 View  Result Date: 01/02/2023 CLINICAL DATA:  Cough and fever for 2 days.  Sepsis. EXAM: CHEST - 2 VIEW COMPARISON:  12/19/2022 FINDINGS: The heart size and mediastinal contours are within normal limits. Mild infiltrate or atelectasis is seen in the posterior right lower lobe. No pleural effusion. IMPRESSION: Mild right lower lobe infiltrate or atelectasis. Electronically Signed   By: Danae Orleans M.D.   On: 01/02/2023 18:48    Results are pending, will review when available.  Assessment and Plan:  * Acute respiratory failure with hypoxia [facility]) Patient presenting with increased nonproductive cough, shortness of breath and was found to be hypoxic.  Likely multifactorial in the setting of community-acquired pneumonia and COPD exacerbation  - Management of CAP and COPD as noted below - Continue supplemental oxygen to maintain oxygen saturation above 88% - Wean as tolerated  CAP (community acquired pneumonia) Chest x-ray notable for right lower lobe opacity in the setting of pulmonary symptoms consistent with CAP.  - Continue Ceftriaxone and azithromycin - RVP and procalcitonin pending - Strep pneumo and Legionella urinary antigens pending  COPD with acute exacerbation (HCC) - S/p Solu-Medrol 125 mg once - Start prednisone 40 mg to complete a 5-day course -  DuoNebs every 6 hours - Continue home bronchodilators  UTI (urinary tract infection) Patient endorsing dysuria and suprapubic abdominal pain.  Urinalysis consistent with UTI.  - Continue Rocephin - Urine culture pending  AKI (acute kidney injury) (HCC) In the setting of poor p.o. intake, nausea with vomiting and underlying illness.  - IV fluids as ordered - Repeat BMP tomorrow - Avoid nephrotoxic agents - Bladder scan  Hypothyroidism - Continue home Synthroid  Benign essential HTN - Home antihypertensives in the setting of borderline low blood pressure due to poor p.o. intake and dehydration  DM (diabetes mellitus), type 2 (HCC) - Hold home regimen - SSI, moderate  Anxiety and depression - Continue home citalopram, Klonopin, doxepin  Advance Care Planning:   Code Status: Full Code   Consults: None  Family Communication: No family at bedside  Severity of Illness: The appropriate patient status for this patient is INPATIENT. Inpatient status is judged to be reasonable and necessary in order to provide the required intensity of service to ensure the patient's safety. The patient's presenting symptoms, physical exam findings, and initial radiographic and laboratory data in the context of their chronic comorbidities is felt to place them at high risk for further clinical deterioration. Furthermore, it is not anticipated that the patient will be medically stable for discharge from the hospital within 2 midnights of admission.   * I certify that at the point of admission it is my clinical judgment that the patient will require inpatient hospital care spanning beyond 2 midnights from the point of admission due to high intensity of service, high risk for further deterioration and high frequency of surveillance required.*  Author: Verdene Lennert, MD 01/03/2023 11:40 AM  For on call review [email or web address].

## 2023-01-03 NOTE — Assessment & Plan Note (Signed)
-   Continue home citalopram, Klonopin, doxepin

## 2023-01-03 NOTE — ED Provider Notes (Addendum)
[facility] Provider Note    Event Date/Time   First MD Initiated Contact with Patient 01/02/23 2335     (approximate)   History   Emesis (Pt. To ED via EMS for vomiting, fever x2 days, and cough/ congestion. Pt. States her home health RN saw pt. Today and gave her antibiotic for UTI. Pt. Alert and oriented in triage.)   HPI [name] is a 72 y.o. female who presents for evaluation of several days of general malaise, fever times about 2 days, some nausea and vomiting, and worsening cough and shortness of breath.  She has a home health nurse that came out today and started the patient on some antibiotics for possible urinary tract infection because she has been having dysuria and some suprapubic pain as well.  However the patient got worse tonight, generalized weakness, could not walk by herself, and had a fever so her son called 911.  The patient was noted to be 88% on room air and said that she felt better after she was placed on 2 L of oxygen by nasal cannula.  EMS provided 125 mg of Solu-Medrol and 1 DuoNeb prior to arrival.  She says she is feeling a little bit better now but is still very weak and occasionally has chills.     Physical Exam   Triage Vital Signs: ED Triage Vitals  Encounter Vitals Group     BP 01/02/23 1745 91/72     Systolic BP Percentile --      Diastolic BP Percentile --      Pulse Rate 01/02/23 1745 82     Resp 01/02/23 1745 18     Temp 01/02/23 1745 (!) 102.8 F (39.3 C)     Temp Source 01/02/23 1745 Oral     SpO2 01/02/23 1745 92 %     Weight 01/02/23 1750 90.7 kg (199 lb 15.3 oz)     Height 01/02/23 1750 1.6 m (5\' 3" )     Head Circumference --      Peak Flow --      Pain Score 01/02/23 1749 8     Pain Loc --      Pain Education --      Exclude from Growth Chart --     Most recent vital signs: Vitals:   01/02/23 1859 01/03/23 0117  BP:  107/67  Pulse:  75  Resp:  18  Temp: (!) 100.8 F (38.2 C) 98.4 F (36.9  C)  SpO2:  100%    General: Awake, alert, communicative. CV:  Good peripheral perfusion.  Regular rate and rhythm. Resp:  Normal effort. Speaking easily and comfortably, no accessory muscle usage nor intercostal retractions.  No coarse breath sounds or wheezing at this time status post nebulizer treatment. Abd:  No distention.  Obese, no tenderness to palpation including in the suprapubic region.   ED Results / Procedures / Treatments   Labs (all labs ordered are listed, but only abnormal results are displayed) Labs Reviewed  COMPREHENSIVE METABOLIC PANEL - Abnormal; Notable for the following components:      Result Value   Sodium 131 (*)    Chloride 97 (*)    Glucose, Bld 224 (*)    BUN 29 (*)    Creatinine, Ser 1.36 (*)    Calcium 8.4 (*)    Albumin 3.1 (*)    GFR, Estimated 41 (*)    All other components within normal limits  URINALYSIS, COMPLETE (UACMP) WITH MICROSCOPIC -  Abnormal; Notable for the following components:   Color, Urine AMBER (*)    APPearance CLOUDY (*)    Hgb urine dipstick MODERATE (*)    Protein, ur 100 (*)    Leukocytes,Ua LARGE (*)    Bacteria, UA RARE (*)    All other components within normal limits  RESP PANEL BY RT-PCR (RSV, FLU A&B, COVID)  [id number]  CULTURE, BLOOD (ROUTINE X 2)  CULTURE, BLOOD (ROUTINE X 2)  URINE CULTURE  LACTIC ACID, PLASMA  LACTIC ACID, PLASMA  CBC WITH DIFFERENTIAL/PLATELET  PROTIME-INR     RADIOLOGY I viewed and interpreted the patient's two-view chest x-ray and there appears to be a right lower lobe pneumonia.  Radiologist identified it as pneumonia versus atelectasis.   PROCEDURES:  Critical Care performed: No  .1-3 Lead EKG Interpretation  Performed by: Loleta Rose, MD Authorized by: Loleta Rose, MD     Interpretation: normal     ECG rate:  85   ECG rate assessment: normal     Rhythm: sinus rhythm     Ectopy: none     Conduction: normal   .Critical Care  Performed by: Loleta Rose,  MD Authorized by: Loleta Rose, MD   Critical care provider statement:    Critical care time (minutes):  45   Critical care time was exclusive of:  Separately billable procedures and treating other patients   Critical care was necessary to treat or prevent imminent or life-threatening deterioration of the following conditions:  Respiratory failure   Critical care was time spent personally by me on the following activities:  Development of treatment plan with patient or surrogate, evaluation of patient's response to treatment, examination of patient, obtaining history from patient or surrogate, ordering and performing treatments and interventions, ordering and review of laboratory studies, ordering and review of radiographic studies, pulse oximetry, re-evaluation of patient's condition and review of old charts     IMPRESSION / MDM / ASSESSMENT AND PLAN / ED COURSE  I reviewed the triage vital signs and the nursing notes.                              Differential diagnosis includes, but is not limited to, community-acquired pneumonia, UTI, sepsis, COPD exacerbation.  Patient's presentation is most consistent with acute presentation with potential threat to life or bodily function.  Labs/studies ordered: 2 view chest x-ray, urinalysis, CBC with differential, blood cultures x 2, lactic acid, CMP, urine culture, respiratory viral panel  Interventions/Medications given:  Medications  acetaminophen (TYLENOL) tablet 1,000 mg (has no administration in time range)  ondansetron (ZOFRAN) injection 4 mg (has no administration in time range)  cefTRIAXone (ROCEPHIN) 1 g in sodium chloride 0.9 % 100 mL IVPB (1 g Intravenous New Bag/Given 01/03/23 0118)  azithromycin (ZITHROMAX) 500 mg in sodium chloride 0.9 % 250 mL IVPB (500 mg Intravenous New Bag/Given 01/03/23 0119)  lactated ringers bolus 1,000 mL (1,000 mLs Intravenous New Bag/Given 01/03/23 0118)    (Note:  hospital course my include  additional interventions and/or labs/studies not listed above.)   Patient is febrile and has a low blood pressure.  I looked back through her notes and she never has particular high blood pressure but this is a bit lower than usual.  Technically she does not meet SIRS criteria but I am concerned about sepsis given the mild hypotension and her fever.  Her lab work shows no leukocytosis and a normal lactic acid which  is reassuring, but her CMP shows an acute kidney injury with a creatinine of 1.36 and some mild hyponatremia.  2 view chest x-ray suggest right lower lobe pneumonia which is consistent with her hypoxia reported prehospital.  Then I evaluated her on room air and she dropped down to 89% with a good waveform at rest.  I put her back on 2 L of oxygen.  The patient is on the cardiac monitor to evaluate for evidence of arrhythmia and/or significant heart rate changes.  I ordered ceftriaxone 1 g IV and azithromycin 500 mg IV for empiric treatment of community-acquired pneumonia.  Also ordered LR 1 L IV bolus.  Urinalysis is pending.  Anticipate admission given acute respiratory failure with hypoxia, pneumonia, and probable UTI.  Clinical Course as of 01/03/23 0220  Thu Jan 03, 2023  0204 Urinalysis, Complete w Microscopic -Urine, Catheterized(!) Urinalysis is positive for UTI.  Urine culture is pending.  Patient has already received ceftriaxone [CF]  0204 Blood pressure improved with fluids. [CF]  0205 Consulting the hospitalist for admission for right lower lobe pneumonia, acute respiratory failure with hypoxia, UTI, and generalized weakness with inability to ambulate. [CF]    Clinical Course User Index [CF] Loleta Rose, MD     FINAL CLINICAL IMPRESSION(S) / ED DIAGNOSES   Final diagnoses:  Acute respiratory failure with hypoxia (HCC)  Urinary tract infection without hematuria, site unspecified  Pneumonia of right lower lobe due to infectious organism     Rx / DC Orders   ED  Discharge Orders     None        Note:  This document was prepared using Dragon voice recognition software and may include unintentional dictation errors.   Loleta Rose, MD 01/03/23 1517    Loleta Rose, MD 01/03/23 [phone]

## 2023-01-03 NOTE — Assessment & Plan Note (Signed)
-   Hold home regimen - SSI, moderate

## 2023-01-03 NOTE — Assessment & Plan Note (Signed)
Chest x-ray notable for right lower lobe opacity in the setting of pulmonary symptoms consistent with CAP.  - Continue Ceftriaxone and azithromycin - RVP and procalcitonin pending - Strep pneumo and Legionella urinary antigens pending

## 2023-01-03 NOTE — Assessment & Plan Note (Signed)
-   S/p Solu-Medrol 125 mg once - Start prednisone 40 mg to complete a 5-day course - DuoNebs every 6 hours - Continue home bronchodilators

## 2023-01-03 NOTE — Assessment & Plan Note (Addendum)
In the setting of poor p.o. intake, nausea with vomiting and underlying illness.  - IV fluids as ordered - Repeat BMP tomorrow - Avoid nephrotoxic agents - Bladder scan

## 2023-01-03 NOTE — Progress Notes (Signed)
PHARMACIST - PHYSICIAN COMMUNICATION  CONCERNING:  Enoxaparin (Lovenox) for DVT Prophylaxis   RECOMMENDATION: Patient was prescribed enoxaprin 40mg  q24 hours for VTE prophylaxis.   Filed Weights   01/02/23 1750  Weight: 90.7 kg (199 lb 15.3 oz)    Body mass index is 35.42 kg/m.  Estimated Creatinine Clearance: 39.4 mL/min (A) (by C-G formula based on SCr of 1.36 mg/dL (H)).   Based on [facility] policy patient is candidate for enoxaparin 0.5mg /kg TBW SQ every 24 hours based on BMI being >30.   DESCRIPTION: Pharmacy has adjusted enoxaparin dose per [facility] policy.  Patient is now receiving enoxaparin 45 mg every 24 hours   Effie Shy, PharmD Pharmacy Resident  01/03/2023 9:44 AM

## 2023-01-04 ENCOUNTER — Encounter: Payer: Self-pay | Admitting: Internal Medicine

## 2023-01-04 DIAGNOSIS — J9601 Acute respiratory failure with hypoxia: Secondary | ICD-10-CM | POA: Diagnosis not present

## 2023-01-04 LAB — CBC WITH DIFFERENTIAL/PLATELET
Abs Immature Granulocytes: 0.05 10*3/uL (ref 0.00–0.07)
Basophils Absolute: 0 10*3/uL (ref 0.0–0.1)
Basophils Relative: 0 %
Eosinophils Absolute: 0 10*3/uL (ref 0.0–0.5)
Eosinophils Relative: 0 %
HCT: 35.7 % — ABNORMAL LOW (ref 36.0–46.0)
Hemoglobin: 11 g/dL — ABNORMAL LOW (ref 12.0–15.0)
Immature Granulocytes: 1 %
Lymphocytes Relative: 11 %
Lymphs Abs: 1 10*3/uL (ref 0.7–4.0)
MCH: 29.3 pg (ref 26.0–34.0)
MCHC: 30.8 g/dL (ref 30.0–36.0)
MCV: 94.9 fL (ref 80.0–100.0)
Monocytes Absolute: 0.7 10*3/uL (ref 0.1–1.0)
Monocytes Relative: 7 %
Neutro Abs: 8 10*3/uL — ABNORMAL HIGH (ref 1.7–7.7)
Neutrophils Relative %: 81 %
Platelets: 211 10*3/uL (ref 150–400)
RBC: 3.76 MIL/uL — ABNORMAL LOW (ref 3.87–5.11)
RDW: 13 % (ref 11.5–15.5)
WBC: 9.8 10*3/uL (ref 4.0–10.5)
nRBC: 0 % (ref 0.0–0.2)

## 2023-01-04 LAB — BASIC METABOLIC PANEL
Anion gap: 7 (ref 5–15)
BUN: 35 mg/dL — ABNORMAL HIGH (ref 8–23)
CO2: 29 mmol/L (ref 22–32)
Calcium: 8.5 mg/dL — ABNORMAL LOW (ref 8.9–10.3)
Chloride: 103 mmol/L (ref 98–111)
Creatinine, Ser: 1.15 mg/dL — ABNORMAL HIGH (ref 0.44–1.00)
GFR, Estimated: 50 mL/min — ABNORMAL LOW (ref >60)
Glucose, Bld: 166 mg/dL — ABNORMAL HIGH (ref 70–99)
Potassium: 3.8 mmol/L (ref 3.5–5.1)
Sodium: 138 mmol/L (ref 135–145)

## 2023-01-04 LAB — GLUCOSE, CAPILLARY
Glucose-Capillary: 123 mg/dL — ABNORMAL HIGH (ref 70–99)
Glucose-Capillary: 190 mg/dL — ABNORMAL HIGH (ref 70–99)
Glucose-Capillary: 175 mg/dL — ABNORMAL HIGH (ref 70–99)

## 2023-01-04 LAB — CBG MONITORING, ED: Glucose-Capillary: 123 mg/dL — ABNORMAL HIGH (ref 70–99)

## 2023-01-04 LAB — STREP PNEUMONIAE URINARY ANTIGEN: Strep Pneumo Urinary Antigen: POSITIVE — AB

## 2023-01-04 MED ORDER — AZITHROMYCIN 250 MG PO TABS: 500.0000 mg | ORAL_TABLET | Freq: Every day | ORAL | Status: DC

## 2023-01-04 MED ORDER — INFLUENZA VAC A&B SURF ANT ADJ 0.5 ML IM SUSY
0.5000 mL | PREFILLED_SYRINGE | INTRAMUSCULAR | Status: AC
  Administered 2023-01-05: 0.5 mL via INTRAMUSCULAR
  Filled 2023-01-04: qty 0.5

## 2023-01-04 MED ORDER — IPRATROPIUM-ALBUTEROL 0.5-2.5 (3) MG/3ML IN SOLN
3.0000 mL | Freq: Three times a day (TID) | RESPIRATORY_TRACT | Status: DC
  Administered 2023-01-04 – 2023-01-05 (×2): 3 mL via RESPIRATORY_TRACT
  Filled 2023-01-04 (×2): qty 3

## 2023-01-04 NOTE — ED Notes (Signed)
Informed RN Pam via chat/ pt has bed assigned

## 2023-01-04 NOTE — Plan of Care (Signed)
  Problem: Fluid Volume: Goal: Ability to maintain a balanced intake and output will improve Outcome: Progressing   Problem: Metabolic: Goal: Ability to maintain appropriate glucose levels will improve Outcome: Progressing   Problem: Nutritional: Goal: Maintenance of adequate nutrition will improve Outcome: Progressing   

## 2023-01-04 NOTE — Progress Notes (Signed)
  PROGRESS NOTE    [name]  HYQ:[id number] DOB: 03-19-1950 DOA: 01/02/2023 PCP: Lorre Munroe, NP  212A/212A-AA  LOS: 1 day   Brief hospital course:   Assessment & Plan: [name] is a 72 y.o. female with medical history significant of COPD, hypertension, hyperlipidemia, psoriasis, PAD with intermittent claudication, type 2 diabetes, hypothyroidism, GERD, who presents to the ED due to nausea and vomiting.   [name] states that for the last few days, she has been experiencing fever, nausea with nonbilious vomiting, abdominal pain and pain with urination.  She notes that her abdominal symptoms started first, but then afterwards she began to experience a nonproductive cough with worsening shortness of breath.   * Acute respiratory failure with hypoxia [facility]) Patient presenting with increased nonproductive cough, shortness of breath and was found to be hypoxic.  Likely multifactorial in the setting of community-acquired pneumonia and COPD exacerbation - Management of CAP and COPD as noted below --Continue supplemental O2 to keep sats >=90%, wean as tolerated  Strep pneumo PNA Chest x-ray notable for right lower lobe opacity in the setting of pulmonary symptoms consistent with CAP.  Strep pneumo antigen pos. --cont ceftriaxone --d/c azithromycin  COPD with acute exacerbation (HCC) - S/p Solu-Medrol 125 mg once - cont prednisone 40 mg daily - DuoNebs every 6 hours - Continue home bronchodilators  UTI (urinary tract infection) Patient endorsing dysuria and suprapubic abdominal pain.  Urinalysis consistent with UTI. --cont ceftriaxone pending urine cx  AKI (acute kidney injury) (HCC) In the setting of poor p.o. intake, nausea with vomiting and underlying illness. --Cr 1.36 on presentation  Hypothyroidism - Continue home Synthroid  Benign essential HTN --BP intermittently soft  DM (diabetes mellitus), type 2 (HCC) - Hold home regimen - SSI, moderate  Anxiety and  depression - Continue home citalopram, Klonopin, doxepin   DVT prophylaxis: Lovenox SQ Code Status: Full code  Family Communication:  Level of care: Telemetry Medical Dispo:   The patient is from: home Anticipated d/c is to: home Anticipated d/c date is: 2-3 days   Subjective and Interval History:  Pt reported still having some dysuria but improved.  No dyspnea, no sputum production.   Objective: Vitals:   01/04/23 0900 01/04/23 0927 01/04/23 1349 01/04/23 1806  BP:  (!) 147/59  (!) 115/48  Pulse: 75 73  72  Resp: 11 17    Temp:  (!) 97.5 F (36.4 C)  98.1 F (36.7 C)  TempSrc:      SpO2: 97% 100% 96% 96%  Weight:      Height:        Intake/Output Summary (Last 24 hours) at 01/04/2023 1926 Last data filed at 01/03/2023 2350 Gross per 24 hour  Intake 186.14 ml  Output --  Net 186.14 ml   Filed Weights   01/02/23 1750  Weight: 90.7 kg    Examination:   Constitutional: NAD, AAOx3 HEENT: conjunctivae and lids normal, EOMI CV: No cyanosis.   RESP: normal respiratory effort Neuro: II - XII grossly intact.   Psych: Normal mood and affect.  Appropriate judgement and reason   Data Reviewed: I have personally reviewed labs and imaging studies  Time spent: 50 minutes  Darlin Priestly, MD [facility] If 7PM-7AM, please contact night-coverage 01/04/2023, 7:26 PM

## 2023-01-05 ENCOUNTER — Inpatient Hospital Stay: Payer: PPO

## 2023-01-05 DIAGNOSIS — J9601 Acute respiratory failure with hypoxia: Secondary | ICD-10-CM | POA: Diagnosis not present

## 2023-01-05 LAB — BASIC METABOLIC PANEL
Anion gap: 7 (ref 5–15)
BUN: 32 mg/dL — ABNORMAL HIGH (ref 8–23)
CO2: 32 mmol/L (ref 22–32)
Calcium: 8.7 mg/dL — ABNORMAL LOW (ref 8.9–10.3)
Chloride: 101 mmol/L (ref 98–111)
Creatinine, Ser: 1.1 mg/dL — ABNORMAL HIGH (ref 0.44–1.00)
GFR, Estimated: 53 mL/min — ABNORMAL LOW (ref >60)
Glucose, Bld: 118 mg/dL — ABNORMAL HIGH (ref 70–99)
Potassium: 4.6 mmol/L (ref 3.5–5.1)
Sodium: 140 mmol/L (ref 135–145)

## 2023-01-05 LAB — GLUCOSE, CAPILLARY
Glucose-Capillary: 103 mg/dL — ABNORMAL HIGH (ref 70–99)
Glucose-Capillary: 221 mg/dL — ABNORMAL HIGH (ref 70–99)
Glucose-Capillary: 146 mg/dL — ABNORMAL HIGH (ref 70–99)

## 2023-01-05 LAB — URINE CULTURE: Culture: 50000 — AB

## 2023-01-05 LAB — MAGNESIUM: Magnesium: 2.5 mg/dL — ABNORMAL HIGH (ref 1.7–2.4)

## 2023-01-05 MED ORDER — IPRATROPIUM-ALBUTEROL 0.5-2.5 (3) MG/3ML IN SOLN
3.0000 mL | Freq: Two times a day (BID) | RESPIRATORY_TRACT | Status: DC
  Administered 2023-01-05 – 2023-01-09 (×7): 3 mL via RESPIRATORY_TRACT
  Filled 2023-01-05 (×8): qty 3

## 2023-01-05 MED ORDER — BENZONATATE 100 MG PO CAPS
100.0000 mg | ORAL_CAPSULE | Freq: Three times a day (TID) | ORAL | Status: DC | PRN
  Administered 2023-01-05 – 2023-01-07 (×3): 100 mg via ORAL
  Filled 2023-01-05 (×3): qty 1

## 2023-01-05 NOTE — TOC Initial Note (Signed)
Transition of Care [facility]) - Initial/Assessment Note    Patient Details  Name: [name] MRN: [id number] Date of Birth: 02/01/1951  Transition of Care [facility]) CM/SW Contact:    Liliana Cline, LCSW Phone Number: 01/05/2023, 12:34 PM  Clinical Narrative:                 CSW met with patient at bedside to complete readmission risk assessment. Patient is from home alone. Cousin provides transport. PCP is [employer] Pharmacy is [facility] on [address] Patient states she has a Engineer, civil (consulting) through [employer] who comes out once every 3 months. Patient has a RW and cane at home. Patient denies TOC needs at this time, TOC will continue to follow during admission.  Expected Discharge Plan: Home/Self Care Barriers to Discharge: Continued Medical Work up   Patient Goals and CMS Choice Patient states their goals for this hospitalization and ongoing recovery are:: to return home          Expected Discharge Plan and Services       Living arrangements for the past 2 months: Single Family Home                                      Prior Living Arrangements/Services Living arrangements for the past 2 months: Single Family Home Lives with:: Self Patient language and need for interpreter reviewed:: Yes Do you feel safe going back to the place where you live?: Yes      Need for Family Participation in Patient Care: Yes (Comment) Care giver support system in place?: Yes (comment) Current home services: DME Criminal Activity/Legal Involvement Pertinent to Current Situation/Hospitalization: No - Comment as needed  Activities of Daily Living   ADL Screening (condition at time of admission) Independently performs ADLs?: Yes (appropriate for developmental age) Is the patient deaf or have difficulty hearing?: No Does the patient have difficulty seeing, even when wearing glasses/contacts?: No Does the patient have difficulty concentrating, remembering, or making  decisions?: Yes  Permission Sought/Granted Permission sought to share information with : Facility Industrial/product designer granted to share information with : Yes, Verbal Permission Granted     Permission granted to share info w AGENCY: as needed for DC planning        Emotional Assessment       Orientation: : Oriented to Self, Oriented to Place, Oriented to  Time, Oriented to Situation Alcohol / Substance Use: Not Applicable Psych Involvement: No (comment)  Admission diagnosis:  Acute respiratory failure with hypoxia (HCC) [J96.01] Urinary tract infection without hematuria, site unspecified [N39.0] Pneumonia of right lower lobe due to infectious organism [J18.9] Patient Active Problem List   Diagnosis Date Noted   Acute respiratory failure with hypoxia (HCC) 01/03/2023   AKI (acute kidney injury) (HCC) 01/03/2023   COPD with acute exacerbation (HCC) 04/12/2022   CAP (community acquired pneumonia) 04/12/2022   Psoriasis 07/26/2020   Class 1 obesity due to excess calories with serious comorbidity and body mass index (BMI) of 34.0 to 34.9 in adult 07/26/2020   COPD (chronic obstructive pulmonary disease) (HCC) 10/28/2017   Thoracic aortic atherosclerosis (HCC) 02/12/2017   Osteoarthritis 05/07/2016   Insomnia 01/10/2016   Bilateral carotid artery stenosis 08/22/2015   Benign essential HTN 08/04/2015   Mixed hyperlipidemia 08/04/2015   Atherosclerotic peripheral vascular disease with intermittent claudication (HCC) 08/04/2015   Panic disorder without agoraphobia with moderate panic attacks  07/01/2015   DM (diabetes mellitus), type 2 (HCC) 06/09/2015   Anxiety and depression 06/23/2014   Chronic, continuous use of opioids 07/28/2013   UTI (urinary tract infection) 05/06/2012   Esophageal reflux 07/01/2009   Hypothyroidism 01/11/2009   PCP:  Lorre Munroe, NP Pharmacy:   [id number] [place], [place] - [address] AT [facility] [address] [place] [address] Phone: [phone] Fax: [phone]  [facility] [id number] [place], [place] - [address] [place] [address] Phone: [phone] Fax: [phone]     Social Determinants of Health (SDOH) Social History: SDOH Screenings   Food Insecurity: No Food Insecurity (01/04/2023)  Housing: Low Risk  (01/04/2023)  Transportation Needs: No Transportation Needs (01/04/2023)  Utilities: Not At Risk (01/04/2023)  Alcohol Screen: Low Risk  (08/28/2022)  Depression (PHQ2-9): Medium Risk (08/28/2022)  Financial Resource Strain: Low Risk  (04/27/2022)  Physical Activity: Inactive (04/27/2022)  Social Connections: Moderately Isolated (04/27/2022)  Stress: No Stress Concern Present (04/27/2022)  Tobacco Use: Medium Risk (01/04/2023)   SDOH Interventions:     Readmission Risk Interventions    01/05/2023   12:33 PM  Readmission Risk Prevention Plan  Transportation Screening Complete  PCP or Specialist Appt within 3-5 Days Complete  HRI or Home Care Consult Complete  Social Work Consult for Recovery Care Planning/Counseling Complete  Palliative Care Screening Not Applicable  Medication Review Oceanographer) Complete

## 2023-01-05 NOTE — Plan of Care (Signed)
  Problem: Coping: Goal: Ability to adjust to condition or change in health will improve Outcome: Progressing   Problem: Fluid Volume: Goal: Ability to maintain a balanced intake and output will improve Outcome: Progressing

## 2023-01-05 NOTE — Plan of Care (Signed)

## 2023-01-05 NOTE — Progress Notes (Signed)
  PROGRESS NOTE    [name]  BMW:[id number] DOB: April 14, 1950 DOA: 01/02/2023 PCP: Lorre Munroe, NP  212A/212A-AA  LOS: 2 days   Brief hospital course:   Assessment & Plan: [name] is a 72 y.o. female with medical history significant of COPD, hypertension, PAD with intermittent claudication, type 2 diabetes, hypothyroidism, who presented to Jill ED due to nausea and vomiting.   [name] states that for Jill last few days, she has been experiencing fever, nausea with nonbilious vomiting, abdominal pain and pain with urination.  She notes that her abdominal symptoms started first, but then afterwards she began to experience a nonproductive cough with worsening shortness of breath.   * Acute respiratory failure with hypoxia [facility]) [name] presenting with increased nonproductive cough, shortness of breath and was found to be hypoxic.  Likely multifactorial in Jill setting of community-acquired pneumonia and COPD exacerbation - Management of CAP and COPD as noted below --Continue supplemental O2 to keep sats >=90%, wean as tolerated  Strep pneumo PNA Chest x-ray notable for right lower lobe opacity in Jill setting of pulmonary symptoms consistent with CAP.  Strep pneumo antigen pos.  Azithromycin d/c'ed. --cont ceftriaxone  COPD with acute exacerbation (HCC) - S/p Solu-Medrol 125 mg once --cont prednisone 40 mg daily - DuoNebs scheduled - Continue home bronchodilators  UTI (urinary tract infection) 2/2 E coli [name] endorsing dysuria and suprapubic abdominal pain.  Urinalysis consistent with UTI. --cont ceftriaxone  AKI (acute kidney injury) (HCC) In Jill setting of poor p.o. intake, nausea with vomiting and underlying illness. --Cr 1.36 on presentation, trending down  Hypothyroidism - Continue home Synthroid  Benign essential HTN --BP intermittently soft --hold home BP meds for now  DM (diabetes mellitus), type 2 (HCC) - Hold home regimen - SSI, moderate  Anxiety and  depression - Continue home citalopram, Klonopin, doxepin  Left foot numbness --[name] reported decreased sensation over left foot --LLE ABI   DVT prophylaxis: Lovenox SQ Code Status: Full code  Family Communication: cousin (care giver) updated at bedside today Level of care: Telemetry Medical Dispo:   [name] is from: home Anticipated d/c is to: home Anticipated d/c date is: 1-2 days.  On IV abx for strep pneumo PNA   Subjective and Interval History:  [name] reported dysuria improved.  Had some cough.   Objective: Vitals:   01/05/23 0722 01/05/23 0848 01/05/23 1657 01/05/23 2031  BP:  115/61 (!) 141/61 136/65  Pulse:  81 73 66  Resp:  18 18 16   Temp:  (!) 97.5 F (36.4 C) 98.8 F (37.1 C) (!) 97.5 F (36.4 C)  TempSrc:    Oral  SpO2: 97% 95% 93% 97%  Weight:      Height:        Intake/Output Summary (Last 24 hours) at 01/05/2023 2039 Last data filed at 01/05/2023 0246 Gross per 24 hour  Intake 100 ml  Output --  Net 100 ml   Filed Weights   01/02/23 1750  Weight: 90.7 kg    Examination:   Constitutional: NAD, AAOx3 HEENT: conjunctivae and lids normal, EOMI CV: No cyanosis.   RESP: normal respiratory effort Neuro: II - XII grossly intact.   Psych: Normal mood and affect.  Appropriate judgement and reason   Data Reviewed: I have personally reviewed labs and imaging studies  Time spent: 50 minutes  Darlin Priestly, MD [facility] If 7PM-7AM, please contact night-coverage 01/05/2023, 8:39 PM

## 2023-01-06 ENCOUNTER — Inpatient Hospital Stay: Payer: PPO

## 2023-01-06 DIAGNOSIS — J9601 Acute respiratory failure with hypoxia: Secondary | ICD-10-CM | POA: Diagnosis not present

## 2023-01-06 LAB — GLUCOSE, CAPILLARY
Glucose-Capillary: 118 mg/dL — ABNORMAL HIGH (ref 70–99)
Glucose-Capillary: 85 mg/dL (ref 70–99)
Glucose-Capillary: 96 mg/dL (ref 70–99)
Glucose-Capillary: 239 mg/dL — ABNORMAL HIGH (ref 70–99)
Glucose-Capillary: 113 mg/dL — ABNORMAL HIGH (ref 70–99)

## 2023-01-06 MED ORDER — CARVEDILOL 6.25 MG PO TABS
3.1250 mg | ORAL_TABLET | Freq: Two times a day (BID) | ORAL | Status: DC
Start: 2023-01-06 — End: 2023-01-09
  Administered 2023-01-06 – 2023-01-09 (×7): 3.125 mg via ORAL
  Filled 2023-01-06 (×7): qty 1

## 2023-01-06 MED ORDER — AMOXICILLIN-POT CLAVULANATE 875-125 MG PO TABS
1.0000 | ORAL_TABLET | Freq: Two times a day (BID) | ORAL | Status: AC
  Administered 2023-01-07 – 2023-01-08 (×2): 1 via ORAL
  Filled 2023-01-06 (×2): qty 1

## 2023-01-06 NOTE — Hospital Course (Addendum)
HPI: [name] is a 72 y.o. female with medical history significant of COPD, hypertension, PAD with intermittent claudication, type 2 diabetes, hypothyroidism, who presented to the ED due to nausea and vomiting. Assoc w/ fever, abdominal pain and pain with urination. She notes that her abdominal symptoms started first, but then afterwards she began to experience a nonproductive cough with worsening shortness of breath.    Hospital course / significant events:  11/28: admitted to hospitalist service for CAP, COPD, AKI 11/29-11/30: improving, still needign O2 12/01: PT/OT recs for SNF. O2 requirement about the same 12/02: breathing stable/improved. SNF placement pending.   Consultants:  none  Procedures/Surgeries: none      ASSESSMENT & PLAN:   Acute respiratory failure with hypoxia Likely multifactorial in the setting of community-acquired pneumonia and COPD exacerbation Management of CAP and COPD as noted below Continue supplemental O2 to keep sats >=90%, wean as tolerated   Strep pneumo PNA Chest x-ray notable for RLL opacity in the setting of pulmonary symptoms consistent with CAP.  Strep pneumo antigen pos cont ceftriaxone   COPD with acute exacerbation  S/p Solu-Medrol 125 mg once cont prednisone 40 mg daily DuoNebs scheduled Continue home bronchodilators   UTI (urinary tract infection) 2/2 E coli Patient endorsing dysuria and suprapubic abdominal pain.   Urinalysis consistent with UTI. cont ceftriaxone   LLE numbness associated w/ low back pain - chronic  Lumbar disc disease XR lumbar spine abn - MRI confirms neural compression worst at L4/L5 Discussed w/ Dr Katrinka Blazing neurosurgery - he will have his office set up outpatinet follow up  PT/OT as tolerated at SNF  AKI (acute kidney injury) improved  In the setting of poor p.o. intake, nausea with vomiting and underlying illness. Continue hydration Monitor BMP   Hypothyroidism Continue home Synthroid   Benign  essential HTN BP intermittently soft but improving home BP meds held (COreg, Cardizem, Losartan), will restart Coreg today   DM (diabetes mellitus), type 2  Hold home regimen SSI, moderate   Anxiety and depression Continue home citalopram, Klonopin, doxepin      obesity based on BMI: Body mass index is 35.42 kg/m.  Underweight - under 18.5  normal weight - 18.5 to 24.9 overweight - 25 to 29.9 obese - 30 or more   DVT prophylaxis: lovenox IV fluids: no continuous IV fluids  Nutrition: cardiac/carb diet Central lines / invasive devices: none  Code Status: FULL CODE ACP documentation reviewed: 01/06/23 and none on file in [facility]  [facility] needs: SNF rehab placement Barriers to dispo / significant pending items: placement

## 2023-01-06 NOTE — Progress Notes (Signed)
PROGRESS NOTE    [name]   QQV:[id number] DOB: 1950/02/14  DOA: 01/02/2023 Date of Service: 01/06/23 which is hospital day 3  PCP: Lorre Munroe, NP    HPI: [name] is a 72 y.o. female with medical history significant of COPD, hypertension, PAD with intermittent claudication, type 2 diabetes, hypothyroidism, who presented to the ED due to nausea and vomiting. Assoc w/ fever, abdominal pain and pain with urination. She notes that her abdominal symptoms started first, but then afterwards she began to experience a nonproductive cough with worsening shortness of breath.    Hospital course / significant events:  11/28: admitted to hospitalist service for CAP, COPD, AKI 11/29-11/30: improving, still needign O2 12/01: PT/OT recs for SNF  Consultants:  none  Procedures/Surgeries: none      ASSESSMENT & PLAN:   Acute respiratory failure with hypoxia Likely multifactorial in the setting of community-acquired pneumonia and COPD exacerbation Management of CAP and COPD as noted below Continue supplemental O2 to keep sats >=90%, wean as tolerated   Strep pneumo PNA Chest x-ray notable for RLL opacity in the setting of pulmonary symptoms consistent with CAP.  Strep pneumo antigen pos cont ceftriaxone   COPD with acute exacerbation  S/p Solu-Medrol 125 mg once cont prednisone 40 mg daily DuoNebs scheduled Continue home bronchodilators   UTI (urinary tract infection) 2/2 E coli Patient endorsing dysuria and suprapubic abdominal pain.   Urinalysis consistent with UTI. cont ceftriaxone   LLE numbness associated w/ low back pain - chronic  XR lumbar spine Suspect may need lumbar MRI, possible outpatient follow-up nerve conduction   AKI (acute kidney injury) improved  In the setting of poor p.o. intake, nausea with vomiting and underlying illness. Continue hydration Monitor BMP   Hypothyroidism Continue home Synthroid   Benign essential HTN BP intermittently soft  but improving home BP meds held (COreg, Cardizem, Losartan), will restart Coreg today   DM (diabetes mellitus), type 2  Hold home regimen SSI, moderate   Anxiety and depression Continue home citalopram, Klonopin, doxepin      obesity based on BMI: Body mass index is 35.42 kg/m.  Underweight - under 18.5  normal weight - 18.5 to 24.9 overweight - 25 to 29.9 obese - 30 or more   DVT prophylaxis: lovenox IV fluids: no continuous IV fluids  Nutrition: cardiac/carb diet Central lines / invasive devices: none  Code Status: FULL CODE ACP documentation reviewed: 01/06/23 and none on file in [facility]  TOC needs: SNF rehab placement Barriers to dispo / significant pending items: placement              Subjective / Brief ROS:  Patient reports numbness in LLE and lower back pain have been ongoing for months, no significant worsening Denies CP/SOB.  Pain controlled.  Denies new weakness.  Tolerating diet.  Reports no concerns w/ urination/defecation.   Family Communication: none at this time     Objective Findings:  Vitals:   01/05/23 2031 01/05/23 2040 01/06/23 0408 01/06/23 0813  BP: 136/65  (!) 145/59 (!) 153/77  Pulse: 66  71 73  Resp: 16  16   Temp: (!) 97.5 F (36.4 C)  97.8 F (36.6 C) (!) 97.4 F (36.3 C)  TempSrc: Oral  Oral   SpO2: 97% 97% 96% 96%  Weight:      Height:        Intake/Output Summary (Last 24 hours) at 01/06/2023 1433 Last data filed at 01/06/2023 0956 Gross per 24  hour  Intake 103 ml  Output 400 ml  Net -297 ml   Filed Weights   01/02/23 1750  Weight: 90.7 kg    Examination:  Physical Exam Constitutional:      General: She is not in acute distress.    Appearance: She is obese.  Cardiovascular:     Rate and Rhythm: Normal rate and regular rhythm.     Heart sounds: No murmur heard. Pulmonary:     Effort: Pulmonary effort is normal.     Breath sounds: Normal breath sounds.  Abdominal:     Palpations: Abdomen is soft.   Musculoskeletal:     Right lower leg: No edema.     Left lower leg: No edema.     Comments: (+)straight leg raise LLE elicits pain in lumbar spine and hamstring area   Skin:    General: Skin is warm and dry.  Neurological:     Mental Status: She is alert.     Comments: Reports reduced sensatino lateral LLE distal to knee, strength is normal           Scheduled Medications:   [START ON 01/07/2023] amoxicillin-clavulanate  1 tablet Oral Q12H   aspirin EC  81 mg Oral Daily   carvedilol  3.125 mg Oral BID WC   citalopram  40 mg Oral Daily   clonazePAM  1 mg Oral TID   doxepin  50 mg Oral QHS   enoxaparin (LOVENOX) injection  45 mg Subcutaneous Q24H   fenofibrate  160 mg Oral Daily   fluticasone furoate-vilanterol  1 puff Inhalation Daily   insulin aspart  0-15 Units Subcutaneous TID WC   ipratropium-albuterol  3 mL Nebulization BID   levothyroxine  100 mcg Oral Q0600   pantoprazole  20 mg Oral Daily   predniSONE  40 mg Oral Q breakfast   rosuvastatin  20 mg Oral Daily   sodium chloride flush  3 mL Intravenous Q12H    Continuous Infusions:  cefTRIAXone (ROCEPHIN)  IV Stopped (01/06/23 0841)    PRN Medications:  acetaminophen **OR** acetaminophen, benzonatate, ondansetron **OR** ondansetron (ZOFRAN) IV  Antimicrobials from admission:  Anti-infectives (From admission, onward)    Start     Dose/Rate Route Frequency Ordered Stop   01/07/23 2200  amoxicillin-clavulanate (AUGMENTIN) 875-125 MG per tablet 1 tablet        1 tablet Oral Every 12 hours 01/06/23 0812 01/08/23 2159   01/04/23 2200  azithromycin (ZITHROMAX) tablet 500 mg  Status:  Discontinued        500 mg Oral Daily at bedtime 01/04/23 0753 01/04/23 1930   01/04/23 0000  cefTRIAXone (ROCEPHIN) 2 g in sodium chloride 0.9 % 100 mL IVPB  Status:  Discontinued        2 g 200 mL/hr over 30 Minutes Intravenous Every 24 hours 01/03/23 0354 01/03/23 0905   01/03/23 2200  cefTRIAXone (ROCEPHIN) 2 g in sodium chloride  0.9 % 100 mL IVPB        2 g 200 mL/hr over 30 Minutes Intravenous Every 24 hours 01/03/23 0905 01/06/23 2359   01/03/23 2200  azithromycin (ZITHROMAX) 500 mg in sodium chloride 0.9 % 250 mL IVPB  Status:  Discontinued        500 mg 250 mL/hr over 60 Minutes Intravenous Every 24 hours 01/03/23 0905 01/04/23 0753   01/03/23 0400  cefTRIAXone (ROCEPHIN) 1 g in sodium chloride 0.9 % 100 mL IVPB        1 g 200 mL/hr over  30 Minutes Intravenous  Once 01/03/23 0359 01/03/23 0540   01/03/23 0100  cefTRIAXone (ROCEPHIN) 1 g in sodium chloride 0.9 % 100 mL IVPB        1 g 200 mL/hr over 30 Minutes Intravenous  Once 01/03/23 0055 01/03/23 0150   01/03/23 0100  azithromycin (ZITHROMAX) 500 mg in sodium chloride 0.9 % 250 mL IVPB        500 mg 250 mL/hr over 60 Minutes Intravenous  Once 01/03/23 0055 01/03/23 0220           Data Reviewed:  I have personally reviewed the following...  CBC: Recent Labs  Lab 01/02/23 1753 01/04/23 0611  WBC 9.2 9.8  NEUTROABS 7.3 8.0*  HGB 12.2 11.0*  HCT 38.2 35.7*  MCV 92.3 94.9  PLT 219 211   Basic Metabolic Panel: Recent Labs  Lab 01/02/23 1753 01/04/23 0611 01/05/23 0459  NA 131* 138 140  K 3.8 3.8 4.6  CL 97* 103 101  CO2 24 29 32  GLUCOSE 224* 166* 118*  BUN 29* 35* 32*  CREATININE 1.36* 1.15* 1.10*  CALCIUM 8.4* 8.5* 8.7*  MG  --   --  2.5*   GFR: Estimated Creatinine Clearance: 48.7 mL/min (A) (by C-G formula based on SCr of 1.1 mg/dL (H)). Liver Function Tests: Recent Labs  Lab 01/02/23 1753  AST 29  ALT 14  ALKPHOS 58  BILITOT 0.9  PROT 6.9  ALBUMIN 3.1*   No results for input(s): "LIPASE", "AMYLASE" in the last 168 hours. No results for input(s): "AMMONIA" in the last 168 hours. Coagulation Profile: Recent Labs  Lab 01/02/23 1753  INR 1.2   Cardiac Enzymes: No results for input(s): "CKTOTAL", "CKMB", "CKMBINDEX", "TROPONINI" in the last 168 hours. BNP (last 3 results) No results for input(s): "PROBNP" in the  last 8760 hours. HbA1C: No results for input(s): "HGBA1C" in the last 72 hours. CBG: Recent Labs  Lab 01/05/23 1140 01/05/23 1659 01/05/23 2125 01/06/23 0814 01/06/23 1208  GLUCAP 103* 221* 146* 85 96   Lipid Profile: No results for input(s): "CHOL", "HDL", "LDLCALC", "TRIG", "CHOLHDL", "LDLDIRECT" in the last 72 hours. Thyroid Function Tests: No results for input(s): "TSH", "T4TOTAL", "FREET4", "T3FREE", "THYROIDAB" in the last 72 hours. Anemia Panel: No results for input(s): "VITAMINB12", "FOLATE", "FERRITIN", "TIBC", "IRON", "RETICCTPCT" in the last 72 hours. Most Recent Urinalysis On File:     Component Value Date/Time   COLORURINE AMBER (A) 01/03/2023 0053   APPEARANCEUR CLOUDY (A) 01/03/2023 0053   APPEARANCEUR Clear 07/26/2016 1445   LABSPEC 1.018 01/03/2023 0053   LABSPEC 1.012 10/10/2011 1219   PHURINE 5.0 01/03/2023 0053   GLUCOSEU NEGATIVE 01/03/2023 0053   GLUCOSEU Negative 10/10/2011 1219   HGBUR MODERATE (A) 01/03/2023 0053   BILIRUBINUR NEGATIVE 01/03/2023 0053   BILIRUBINUR Negative 08/28/2022 1537   BILIRUBINUR Negative 07/26/2016 1445   BILIRUBINUR Negative 10/10/2011 1219   KETONESUR NEGATIVE 01/03/2023 0053   PROTEINUR 100 (A) 01/03/2023 0053   UROBILINOGEN 0.2 08/28/2022 1537   NITRITE NEGATIVE 01/03/2023 0053   LEUKOCYTESUR LARGE (A) 01/03/2023 0053   LEUKOCYTESUR 2+ 10/10/2011 1219   Sepsis Labs: @LABRCNTIP (procalcitonin:4,lacticidven:4) Microbiology: Recent Results (from the past 240 hour(s))  Culture, blood (Routine x 2)     Status: None (Preliminary result)   Collection Time: 01/02/23  5:53 PM   Specimen: BLOOD  Result Value Ref Range Status   Specimen Description BLOOD BLOOD LEFT ARM  Final   Special Requests   Final    BOTTLES DRAWN AEROBIC AND  ANAEROBIC Blood Culture adequate volume   Culture   Final    NO GROWTH 4 DAYS Performed at [facility], [address] [place], [place] [address]    Report Status PENDING   Incomplete  Culture, blood (Routine x 2)     Status: None (Preliminary result)   Collection Time: 01/02/23  6:00 PM   Specimen: BLOOD  Result Value Ref Range Status   Specimen Description BLOOD BLOOD RIGHT ARM  Final   Special Requests   Final    BOTTLES DRAWN AEROBIC AND ANAEROBIC Blood Culture adequate volume   Culture   Final    NO GROWTH 4 DAYS Performed at [facility], [address], [place], [place] [address]    Report Status PENDING  Incomplete  Resp panel by RT-PCR (RSV, Flu A&B, Covid) Anterior Nasal Swab     Status: None   Collection Time: 01/03/23 12:53 AM   Specimen: Anterior Nasal Swab  Result Value Ref Range Status   SARS Coronavirus 2 by RT PCR NEGATIVE NEGATIVE Final    Comment: (NOTE) SARS-CoV-2 target nucleic acids are NOT DETECTED.  The SARS-CoV-2 RNA is generally detectable in upper respiratory specimens during the acute phase of infection. The lowest concentration of SARS-CoV-2 viral copies this assay can detect is 138 copies/mL. A negative result does not preclude SARS-Cov-2 infection and should not be used as the sole basis for treatment or other patient management decisions. A negative result may occur with  improper specimen collection/handling, submission of specimen other than nasopharyngeal swab, presence of viral mutation(s) within the areas targeted by this assay, and inadequate number of viral copies(<138 copies/mL). A negative result must be combined with clinical observations, patient history, and epidemiological information. The expected result is Negative.  Fact Sheet for Patients:  BloggerCourse.com  Fact Sheet for Healthcare Providers:  SeriousBroker.it  This test is no t yet approved or cleared by the [place] FDA and  has been authorized for detection and/or diagnosis of SARS-CoV-2 by FDA under an Emergency Use Authorization (EUA). This EUA will remain  in effect  (meaning this test can be used) for the duration of the COVID-19 declaration under Section 564(b)(1) of the Act, 21 U.S.C.section 360bbb-3(b)(1), unless the authorization is terminated  or revoked sooner.       Influenza A by PCR NEGATIVE NEGATIVE Final   Influenza B by PCR NEGATIVE NEGATIVE Final    Comment: (NOTE) The Xpert Xpress SARS-CoV-2/FLU/RSV plus assay is intended as an aid in the diagnosis of influenza from Nasopharyngeal swab specimens and should not be used as a sole basis for treatment. Nasal washings and aspirates are unacceptable for Xpert Xpress SARS-CoV-2/FLU/RSV testing.  Fact Sheet for Patients: BloggerCourse.com  Fact Sheet for Healthcare Providers: SeriousBroker.it  This test is not yet approved or cleared by the [place] FDA and has been authorized for detection and/or diagnosis of SARS-CoV-2 by FDA under an Emergency Use Authorization (EUA). This EUA will remain in effect (meaning this test can be used) for the duration of the COVID-19 declaration under Section 564(b)(1) of the Act, 21 U.S.C. section 360bbb-3(b)(1), unless the authorization is terminated or revoked.     Resp Syncytial Virus by PCR NEGATIVE NEGATIVE Final    Comment: (NOTE) Fact Sheet for Patients: BloggerCourse.com  Fact Sheet for Healthcare Providers: SeriousBroker.it  This test is not yet approved or cleared by the [place] FDA and has been authorized for detection and/or diagnosis of SARS-CoV-2 by FDA under an Emergency Use Authorization (  EUA). This EUA will remain in effect (meaning this test can be used) for the duration of the COVID-19 declaration under Section 564(b)(1) of the Act, 21 U.S.C. section 360bbb-3(b)(1), unless the authorization is terminated or revoked.  Performed at [facility], [address] [place], [place] [address]   Urine Culture      Status: Abnormal   Collection Time: 01/03/23 12:53 AM   Specimen: Urine, Random  Result Value Ref Range Status   Specimen Description   Final    URINE, RANDOM Performed at [facility], [address] [place], [place] [address]    Special Requests   Final    NONE Performed at [facility], [address] [place], [place] [address]    Culture 50,000 COLONIES/mL ESCHERICHIA COLI (A)  Final   Report Status 01/05/2023 FINAL  Final   Organism ID, Bacteria ESCHERICHIA COLI (A)  Final      Susceptibility   Escherichia coli - MIC*    AMPICILLIN <=2 SENSITIVE Sensitive     CEFAZOLIN <=4 SENSITIVE Sensitive     CEFEPIME <=0.12 SENSITIVE Sensitive     CEFTRIAXONE <=0.25 SENSITIVE Sensitive     CIPROFLOXACIN <=0.25 SENSITIVE Sensitive     GENTAMICIN <=1 SENSITIVE Sensitive     IMIPENEM <=0.25 SENSITIVE Sensitive     NITROFURANTOIN <=16 SENSITIVE Sensitive     TRIMETH/SULFA <=20 SENSITIVE Sensitive     AMPICILLIN/SULBACTAM <=2 SENSITIVE Sensitive     PIP/TAZO <=4 SENSITIVE Sensitive ug/mL    * 50,000 COLONIES/mL ESCHERICHIA COLI  Respiratory (~20 pathogens) panel by PCR     Status: None   Collection Time: 01/03/23 10:40 AM   Specimen: Nasopharyngeal Swab; Respiratory  Result Value Ref Range Status   Adenovirus NOT DETECTED NOT DETECTED Final   Coronavirus 229E NOT DETECTED NOT DETECTED Final    Comment: (NOTE) The Coronavirus on the Respiratory Panel, DOES NOT test for the novel  Coronavirus (2019 nCoV)    Coronavirus HKU1 NOT DETECTED NOT DETECTED Final   Coronavirus NL63 NOT DETECTED NOT DETECTED Final   Coronavirus OC43 NOT DETECTED NOT DETECTED Final   Metapneumovirus NOT DETECTED NOT DETECTED Final   Rhinovirus / Enterovirus NOT DETECTED NOT DETECTED Final   Influenza A NOT DETECTED NOT DETECTED Final   Influenza B NOT DETECTED NOT DETECTED Final   Parainfluenza Virus 1 NOT DETECTED NOT DETECTED Final   Parainfluenza Virus 2 NOT DETECTED NOT DETECTED Final    Parainfluenza Virus 3 NOT DETECTED NOT DETECTED Final   Parainfluenza Virus 4 NOT DETECTED NOT DETECTED Final   Respiratory Syncytial Virus NOT DETECTED NOT DETECTED Final   Bordetella pertussis NOT DETECTED NOT DETECTED Final   Bordetella Parapertussis NOT DETECTED NOT DETECTED Final   Chlamydophila pneumoniae NOT DETECTED NOT DETECTED Final   Mycoplasma pneumoniae NOT DETECTED NOT DETECTED Final    Comment: Performed at [facility], [address], [place], [place] [address]      Radiology Studies last 3 days: US ARTERIAL ABI (SCREENING LOWER EXTREMITY)  Result Date: 01/06/2023 CLINICAL DATA:  Left foot pain Former smoker Hypertension Hyperlipidemia EXAM: NONINVASIVE PHYSIOLOGIC VASCULAR STUDY OF BILATERAL LOWER EXTREMITIES TECHNIQUE: Evaluation of both lower extremities were performed at rest, including calculation of ankle-brachial indices with single level pressure measurements and doppler recording. COMPARISON:  None available. FINDINGS: Right ABI:  0.85 Left ABI:  1.00 Right Lower Extremity:  Normal arterial waveforms at the ankle. Left Lower Extremity:  Normal arterial waveforms at the ankle. 0.8-0.89 Mild PAD  IMPRESSION: 1. Mild right lower extremity peripheral arterial disease. 2. No significant abnormality of the left lower extremity. Electronically Signed   By: Acquanetta Belling M.D.   On: 01/06/2023 06:47   DG Chest 2 View  Result Date: 01/02/2023 CLINICAL DATA:  Cough and fever for 2 days.  Sepsis. EXAM: CHEST - 2 VIEW COMPARISON:  12/19/2022 FINDINGS: The heart size and mediastinal contours are within normal limits. Mild infiltrate or atelectasis is seen in the posterior right lower lobe. No pleural effusion. IMPRESSION: Mild right lower lobe infiltrate or atelectasis. Electronically Signed   By: Danae Orleans M.D.   On: 01/02/2023 18:48         Sunnie Nielsen, DO [facility] 01/06/2023, 2:33 PM    Dictation software may have been used to generate the above  note. Typos may occur and escape review in typed/dictated notes. Please contact Dr Lyn Hollingshead directly for clarity if needed.  Staff may message me via secure chat in Epic  but this may not receive an immediate response,  please page me for urgent matters!  If 7PM-7AM, please contact night coverage [email or web address]

## 2023-01-06 NOTE — NC FL2 (Signed)
[place] MEDICAID FL2 LEVEL OF CARE FORM     IDENTIFICATION  Patient Name: [name] Birthdate: 1950/05/23 Sex: female Admission Date (Current Location): 01/02/2023  [facility] and [place] Number:  Chiropodist and Address:  [facility], [address] [place], [place] [address]      Provider Number: [id number]  Attending Physician Name and Address:  Sunnie Nielsen, DO  Relative Name and Phone Number:  Godby,William (Son)  [phone] [facility])    Current Level of Care: Hospital Recommended Level of Care: Skilled Nursing Facility Prior Approval Number:    Date Approved/Denied:   PASRR Number: [id number]  Discharge Plan:      Current Diagnoses: Patient Active Problem List   Diagnosis Date Noted   Acute respiratory failure with hypoxia (HCC) 01/03/2023   AKI (acute kidney injury) (HCC) 01/03/2023   COPD with acute exacerbation (HCC) 04/12/2022   CAP (community acquired pneumonia) 04/12/2022   Psoriasis 07/26/2020   Class 1 obesity due to excess calories with serious comorbidity and body mass index (BMI) of 34.0 to 34.9 in adult 07/26/2020   COPD (chronic obstructive pulmonary disease) (HCC) 10/28/2017   Thoracic aortic atherosclerosis (HCC) 02/12/2017   Osteoarthritis 05/07/2016   Insomnia 01/10/2016   Bilateral carotid artery stenosis 08/22/2015   Benign essential HTN 08/04/2015   Mixed hyperlipidemia 08/04/2015   Atherosclerotic peripheral vascular disease with intermittent claudication (HCC) 08/04/2015   Panic disorder without agoraphobia with moderate panic attacks 07/01/2015   DM (diabetes mellitus), type 2 (HCC) 06/09/2015   Anxiety and depression 06/23/2014   Chronic, continuous use of opioids 07/28/2013   UTI (urinary tract infection) 05/06/2012   Esophageal reflux 07/01/2009   Hypothyroidism 01/11/2009    Orientation RESPIRATION BLADDER Height & Weight     Self, Time, Situation, Place  O2 Incontinent Weight: 199  lb 15.3 oz (90.7 kg) Height:  5\' 3"  (160 cm)  BEHAVIORAL SYMPTOMS/MOOD NEUROLOGICAL BOWEL NUTRITION STATUS        Diet (heart healthy/carb modified)  AMBULATORY STATUS COMMUNICATION OF NEEDS Skin   Limited Assist Verbally Other (Comment) (redness)                       Personal Care Assistance Level of Assistance  Bathing, Feeding, Dressing Bathing Assistance: Limited assistance Feeding assistance: Independent Dressing Assistance: Limited assistance     Functional Limitations Info             SPECIAL CARE FACTORS FREQUENCY  PT (By licensed PT), OT (By licensed OT)     PT Frequency: 5 times per week OT Frequency: 5 times per week            Contractures      Additional Factors Info  Code Status, Allergies Code Status Info: full Allergies Info: Ciprofloxacin, Mucinex (Guaifenesin Er), Oxycodone, Oxycodone-acetaminophen, Oxycodone-acetaminophen, Sulfa Antibiotics           Current Medications (01/06/2023):  This is the current hospital active medication list Current Facility-Administered Medications  Medication Dose Route Frequency Provider Last Rate Last Admin   acetaminophen (TYLENOL) tablet 650 mg  650 mg Oral Q6H PRN Verdene Lennert, MD   650 mg at 01/06/23 1316   Or   acetaminophen (TYLENOL) suppository 650 mg  650 mg Rectal Q6H PRN Verdene Lennert, MD       [START ON 01/07/2023] amoxicillin-clavulanate (AUGMENTIN) 875-125 MG per tablet 1 tablet  1 tablet Oral Q12H Sunnie Nielsen, DO       aspirin EC tablet  81 mg  81 mg Oral Daily Verdene Lennert, MD   81 mg at 01/06/23 0951   benzonatate (TESSALON) capsule 100 mg  100 mg Oral TID PRN Darlin Priestly, MD   100 mg at 01/05/23 2247   carvedilol (COREG) tablet 3.125 mg  3.125 mg Oral BID WC Sunnie Nielsen, DO   3.125 mg at 01/06/23 1327   cefTRIAXone (ROCEPHIN) 2 g in sodium chloride 0.9 % 100 mL IVPB  2 g Intravenous Q24H Sunnie Nielsen, DO   Stopped at 01/06/23 0841   citalopram (CELEXA) tablet 40 mg   40 mg Oral Daily Verdene Lennert, MD   40 mg at 01/06/23 0951   clonazePAM (KLONOPIN) tablet 1 mg  1 mg Oral TID Verdene Lennert, MD   1 mg at 01/06/23 0951   doxepin (SINEQUAN) capsule 50 mg  50 mg Oral QHS Verdene Lennert, MD   50 mg at 01/05/23 2212   enoxaparin (LOVENOX) injection 45 mg  45 mg Subcutaneous Q24H Verdene Lennert, MD   45 mg at 01/06/23 0951   fenofibrate tablet 160 mg  160 mg Oral Daily Verdene Lennert, MD   160 mg at 01/06/23 0951   fluticasone furoate-vilanterol (BREO ELLIPTA) 100-25 MCG/ACT 1 puff  1 puff Inhalation Daily Verdene Lennert, MD   1 puff at 01/06/23 0952   insulin aspart (novoLOG) injection 0-15 Units  0-15 Units Subcutaneous TID WC Verdene Lennert, MD   5 Units at 01/05/23 1759   ipratropium-albuterol (DUONEB) 0.5-2.5 (3) MG/3ML nebulizer solution 3 mL  3 mL Nebulization BID Darlin Priestly, MD   3 mL at 01/05/23 2040   levothyroxine (SYNTHROID) tablet 100 mcg  100 mcg Oral Q0600 Verdene Lennert, MD   100 mcg at 01/06/23 0521   ondansetron (ZOFRAN) tablet 4 mg  4 mg Oral Q6H PRN Verdene Lennert, MD       Or   ondansetron (ZOFRAN) injection 4 mg  4 mg Intravenous Q6H PRN Verdene Lennert, MD       pantoprazole (PROTONIX) EC tablet 20 mg  20 mg Oral Daily Verdene Lennert, MD   20 mg at 01/06/23 0951   predniSONE (DELTASONE) tablet 40 mg  40 mg Oral Q breakfast Verdene Lennert, MD   40 mg at 01/06/23 0951   rosuvastatin (CRESTOR) tablet 20 mg  20 mg Oral Daily Verdene Lennert, MD   20 mg at 01/06/23 0951   sodium chloride flush (NS) 0.9 % injection 3 mL  3 mL Intravenous Q12H Verdene Lennert, MD   3 mL at 01/06/23 1610     Discharge Medications: Please see discharge summary for a list of discharge medications.  Relevant Imaging Results:  Relevant Lab Results:   Additional Information SS #: [id number]  Shaleta Ruacho E Adyan Palau, LCSW

## 2023-01-06 NOTE — TOC Progression Note (Signed)
Transition of Care [facility]) - Progression Note    Patient Details  Name: [name] MRN: [id number] Date of Birth: 1950/10/14  Transition of Care [facility]) CM/SW Contact  Liliana Cline, LCSW Phone Number: 01/06/2023, 2:23 PM  Clinical Narrative:    Spoke with patient regarding SNF recommendation. Patient agreeable, prefers [facility] as she has been there in the past and it is close to her home.  SNF work up started.   Expected Discharge Plan: Home/Self Care Barriers to Discharge: Continued Medical Work up  Expected Discharge Plan and Services       Living arrangements for the past 2 months: Single Family Home                                       Social Determinants of Health (SDOH) Interventions SDOH Screenings   Food Insecurity: No Food Insecurity (01/04/2023)  Housing: Low Risk  (01/04/2023)  Transportation Needs: No Transportation Needs (01/04/2023)  Utilities: Not At Risk (01/04/2023)  Alcohol Screen: Low Risk  (08/28/2022)  Depression (PHQ2-9): Medium Risk (08/28/2022)  Financial Resource Strain: Low Risk  (04/27/2022)  Physical Activity: Inactive (04/27/2022)  Social Connections: Moderately Isolated (04/27/2022)  Stress: No Stress Concern Present (04/27/2022)  Tobacco Use: Medium Risk (01/04/2023)    Readmission Risk Interventions    01/05/2023   12:33 PM  Readmission Risk Prevention Plan  Transportation Screening Complete  PCP or Specialist Appt within 3-5 Days Complete  HRI or Home Care Consult Complete  Social Work Consult for Recovery Care Planning/Counseling Complete  Palliative Care Screening Not Applicable  Medication Review Ocean[facility]) Complete

## 2023-01-06 NOTE — Evaluation (Signed)
Occupational Therapy Evaluation Patient Details Name: [name] MRN: [id number] DOB: 11/19/1950 Today's Date: 01/06/2023   History of Present Illness 72 y.o. female with medical history significant of COPD, hypertension, PAD with intermittent claudication, type 2 diabetes, hypothyroidism, who presented to the ED due to nausea and vomiting.   Clinical Impression   Pt was seen for OT evaluation this date. Prior to hospital admission, pt was living alone with her son nearby and generally independent with basic ADL, light meal prep, and son and cousin provide transportation and groceries. Pt presents to acute OT demonstrating impaired ADL performance and functional mobility 2/2 decreased strength, activity tolerance, balance, LLE pain, and need for acute O2 at all times (See OT problem list for additional functional deficits). Pt currently requires MOD A for bed mobility, MIN A for ADL transfers and lateral steps EOB, and MAX A for LB ADL. Supine SpO2 99% on 2L, removed at rest dropped to 88%, sitting EOB on RA dropped further to 85%. 2L reapplied and improved with time/PLB to 94%, 92% with standing and taking side steps. Left on 2L. MD aware. Pt would benefit from skilled OT services to address noted impairments and functional limitations (see below for any additional details) in order to maximize safety and independence while minimizing falls risk and caregiver burden.     If plan is discharge home, recommend the following: A lot of help with walking and/or transfers;A lot of help with bathing/dressing/bathroom;Assistance with cooking/housework;Assist for transportation;Help with stairs or ramp for entrance    Functional Status Assessment  Patient has had a recent decline in their functional status and demonstrates the ability to make significant improvements in function in a reasonable and predictable amount of time.  Equipment Recommendations  Other (comment) (2WW)    Recommendations for Other  Services       Precautions / Restrictions Precautions Precautions: Fall Precaution Comments: watch O2 Restrictions Weight Bearing Restrictions: No      Mobility Bed Mobility Overal bed mobility: Needs Assistance Bed Mobility: Supine to Sit, Sit to Supine     Supine to sit: Mod assist, Used rails, HOB elevated Sit to supine: Min assist        Transfers Overall transfer level: Needs assistance Equipment used: Rolling walker (2 wheels) Transfers: Sit to/from Stand Sit to Stand: Min assist                  Balance Overall balance assessment: Needs assistance Sitting-balance support: Single extremity supported, No upper extremity supported, Feet supported Sitting balance-Leahy Scale: Fair     Standing balance support: Bilateral upper extremity supported, Reliant on assistive device for balance Standing balance-Leahy Scale: Poor                             ADL either performed or assessed with clinical judgement   ADL Overall ADL's : Needs assistance/impaired                                       General ADL Comments: Pt required MAX A for donning socks (reports she typically doesn't wear socks), MIN A for ADL transfers with RW, and anticipate need for MIN A for LB bathing.     Vision         Perception         Praxis  Pertinent Vitals/Pain Pain Assessment Pain Assessment: 0-10 Pain Score: 9  Pain Location: L ankle, radiating to L hip Pain Descriptors / Indicators: Burning, Sharp, Shooting, Radiating Pain Intervention(s): Limited activity within patient's tolerance, Monitored during session, Repositioned, Patient requesting pain meds-RN notified     Extremity/Trunk Assessment Upper Extremity Assessment Upper Extremity Assessment: Generalized weakness   Lower Extremity Assessment Lower Extremity Assessment: Generalized weakness (LLE pain limited)       Communication Communication Communication: No  apparent difficulties   Cognition Arousal: Alert Behavior During Therapy: WFL for tasks assessed/performed Overall Cognitive Status: Within Functional Limits for tasks assessed                                       General Comments  Supine SpO2 99% on 2L, removed at rest dropped to 88%, sitting EOB on RA dropped further to 85%. 2L reapplied and improved with time/PLB to 94%, 92% with standing and taking side steps. Left on 2L. MD aware.    Exercises Other Exercises Other Exercises: Pt educated in PLB to support breath recovery   Shoulder Instructions      Home Living Family/patient expects to be discharged to:: Private residence Living Arrangements: Alone Available Help at Discharge: Family;Available PRN/intermittently (son) Type of Home: Apartment Home Access: Level entry     Home Layout: One level     Bathroom Shower/Tub: Chief Strategy Officer: Handicapped height (has BSC over top)     Home Equipment: BSC/3in1;Rollator (4 wheels);Cane - single point          Prior Functioning/Environment Prior Level of Function : Needs assist             Mobility Comments: rollator for household distances, endorses 4-5 falls in past 37mo ADLs Comments: Mod indep with ADL, slip on shoes, light meal prep, only wearing 2L at night.        OT Problem List: Decreased strength;Pain;Cardiopulmonary status limiting activity;Decreased activity tolerance;Impaired balance (sitting and/or standing);Decreased knowledge of use of DME or AE      OT Treatment/Interventions: Self-care/ADL training;Therapeutic exercise;Therapeutic activities;DME and/or AE instruction;Energy conservation;Patient/family education;Balance training    OT Goals(Current goals can be found in the care plan section) Acute Rehab OT Goals Patient Stated Goal: get better then go home OT Goal Formulation: With patient Time For Goal Achievement: 01/20/23 Potential to Achieve Goals: Good ADL  Goals Pt Will Perform Lower Body Dressing: with modified independence;sit to/from stand Pt Will Transfer to Toilet: with modified independence;ambulating (LRAD) Pt Will Perform Toileting - Clothing Manipulation and hygiene: with modified independence Additional ADL Goal #1: Pt will utilize learned ECS to support ADL/mobility participation with PRN VC to initiate, 3/3 opportunities.  OT Frequency: Min 1X/week    Co-evaluation              AM-PAC OT "6 Clicks" Daily Activity     Outcome Measure Help from another person eating meals?: None Help from another person taking care of personal grooming?: A Little Help from another person toileting, which includes using toliet, bedpan, or urinal?: A Little Help from another person bathing (including washing, rinsing, drying)?: A Lot Help from another person to put on and taking off regular upper body clothing?: A Little Help from another person to put on and taking off regular lower body clothing?: A Lot 6 Click Score: 17   End of Session Equipment Utilized During Treatment: Rolling walker (2  wheels);Oxygen Nurse Communication: Mobility status  Activity Tolerance: Patient tolerated treatment well Patient left: in bed;with call bell/phone within reach;with bed alarm set  OT Visit Diagnosis: Repeated falls (R29.6);Muscle weakness (generalized) (M62.81);Other abnormalities of gait and mobility (R26.89)                Time: 1610-9604 OT Time Calculation (min): 20 min Charges:  OT General Charges $OT Visit: 1 Visit OT Evaluation $OT Eval Moderate Complexity: 1 Mod OT Treatments $Self Care/Home Management : 8-22 mins  Arman Filter., MPH, MS, OTR/L ascom [phone] 01/06/23, 1:32 PM

## 2023-01-06 NOTE — Progress Notes (Signed)
Physical Therapy Evaluation Patient Details Name: [name] MRN: [id number] DOB: [birth year] Today's Date: 01/06/2023  History of Present Illness  Pt is a 72 y.o. female with medical history significant of COPD, hypertension, PAD with intermittent claudication, type 2 diabetes, hypothyroidism, who presented to the ED due to nausea and vomiting. Pt found to have ARF and PNA.  Clinical Impression  Pt presented supine in bed with HOB elevated, awake and willing to participate in therapy session. Prior to admission, pt reported that she ambulates with use of a rollator at all times and is independent with ADLs. Pt admits to "too many to count" when asked about falls over the past six months. She also stated that she has very poor balance. At the time of evaluation, pt required min-mod A for bed mobility, min A for transfers and was only able to take 4-5 very small side steps at EOB with RW and CGA for safety. Pt with L LE/foot numbness and weakness, unable to clear the floor when attempting to step. At the time, PT recommending pt d/c to SNF for short-term rehab prior to returning home alone with intermittent family support of two sons. Pt was agreeable to SNF. Pt would continue to benefit from skilled physical therapy services at this time while admitted and after d/c to address the below listed limitations in order to improve overall safety and independence with functional mobility.         If plan is discharge home, recommend the following: A little help with walking and/or transfers;A little help with bathing/dressing/bathroom;Assistance with cooking/housework;Assist for transportation;Help with stairs or ramp for entrance   Can travel by private vehicle   No    Equipment Recommendations Other (comment) (defer to next venue of care)  Recommendations for Other Services       Functional Status Assessment Patient has had a recent decline in their functional status and demonstrates the ability to  make significant improvements in function in a reasonable and predictable amount of time.     Precautions / Restrictions Precautions Precautions: Fall Precaution Comments: watch O2 Restrictions Weight Bearing Restrictions: No      Mobility  Bed Mobility Overal bed mobility: Needs Assistance Bed Mobility: Supine to Sit, Sit to Supine     Supine to sit: Mod assist, Used rails, HOB elevated Sit to supine: Min assist   General bed mobility comments: mod A for trunk elevation and management of L LE off of and back onto bed with min A    Transfers Overall transfer level: Needs assistance Equipment used: Rolling walker (2 wheels) Transfers: Sit to/from Stand Sit to Stand: Min assist           General transfer comment: min A for stability with transitional movement    Ambulation/Gait               General Gait Details: pt only able to take 4-5 very small side steps at EOB towards the [facility] with RW and CGA with verbal cueing, pt with great difficulty with L foot movement and unable to clear off of the floor  Stairs            Wheelchair Mobility     Tilt Bed    Modified Rankin (Stroke Patients Only)       Balance Overall balance assessment: Needs assistance Sitting-balance support: Feet supported Sitting balance-Leahy Scale: Fair     Standing balance support: Bilateral upper extremity supported, Reliant on assistive device for balance Standing balance-Leahy Scale:  Poor                               Pertinent Vitals/Pain Pain Assessment Pain Assessment: Faces Faces Pain Scale: Hurts even more Pain Location: L ankle, radiating to L hip Pain Descriptors / Indicators: Burning, Sharp, Shooting, Radiating Pain Intervention(s): Monitored during session, Repositioned    Home Living Family/patient expects to be discharged to:: Private residence Living Arrangements: Alone Available Help at Discharge: Family;Available PRN/intermittently  (son) Type of Home: Apartment Home Access: Level entry       Home Layout: One level Home Equipment: BSC/3in1;Rollator (4 wheels);Cane - single point      Prior Function Prior Level of Function : Needs assist             Mobility Comments: rollator for household distances, endorses 4-5 falls in past 23mo ADLs Comments: Mod indep with ADL, slip on shoes, light meal prep, only wearing 2L at night.     Extremity/Trunk Assessment   Upper Extremity Assessment Upper Extremity Assessment: Generalized weakness    Lower Extremity Assessment Lower Extremity Assessment: LLE deficits/detail LLE Deficits / Details: pt with reports of numbness and unable to lift foot off of the floor when attempting to ambulate. She was able to slide the L foot to take side steps at EOB       Communication   Communication Communication: No apparent difficulties  Cognition Arousal: Alert Behavior During Therapy: WFL for tasks assessed/performed Overall Cognitive Status: Within Functional Limits for tasks assessed                                          General Comments General comments (skin integrity, edema, etc.): Supine SpO2 99% on 2L, removed at rest dropped to 88%, sitting EOB on RA dropped further to 85%. 2L reapplied and improved with time/PLB to 94%, 92% with standing and taking side steps. Left on 2L. MD aware.    Exercises     Assessment/Plan    PT Assessment Patient needs continued PT services  PT Problem List Decreased strength;Decreased range of motion;Decreased activity tolerance;Decreased balance;Decreased mobility;Decreased coordination;Decreased knowledge of use of DME;Decreased safety awareness;Decreased knowledge of precautions;Pain       PT Treatment Interventions DME instruction;Gait training;Functional mobility training;Stair training;Therapeutic activities;Therapeutic exercise;Balance training;Neuromuscular re-education;Patient/family education    PT  Goals (Current goals can be found in the Care Plan section)  Acute Rehab PT Goals Patient Stated Goal: to get stronger before returning home PT Goal Formulation: With patient Time For Goal Achievement: 01/20/23 Potential to Achieve Goals: Good    Frequency Min 1X/week     Co-evaluation               AM-PAC PT "6 Clicks" Mobility  Outcome Measure Help needed turning from your back to your side while in a flat bed without using bedrails?: A Little Help needed moving from lying on your back to sitting on the side of a flat bed without using bedrails?: A Lot Help needed moving to and from a bed to a chair (including a wheelchair)?: A Lot Help needed standing up from a chair using your arms (e.g., wheelchair or bedside chair)?: A Little Help needed to walk in hospital room?: A Lot Help needed climbing 3-5 steps with a railing? : Total 6 Click Score: 13    End of Session  Equipment Utilized During Treatment: Oxygen Activity Tolerance: Patient limited by fatigue Patient left: in bed;with call bell/phone within reach;with bed alarm set Nurse Communication: Mobility status PT Visit Diagnosis: Other abnormalities of gait and mobility (R26.89)    Time: 1101-1120 PT Time Calculation (min) (ACUTE ONLY): 19 min   Charges:   PT Evaluation $PT Eval Moderate Complexity: 1 Mod   PT General Charges $$ ACUTE PT VISIT: 1 Visit         Arletta Bale, DPT  Acute Rehabilitation Services Office [phone]   Alessandra Bevels Beni Turrell 01/06/2023, 1:33 PM

## 2023-01-07 ENCOUNTER — Telehealth: Payer: Self-pay | Admitting: Neurosurgery

## 2023-01-07 ENCOUNTER — Inpatient Hospital Stay: Payer: PPO

## 2023-01-07 DIAGNOSIS — J9601 Acute respiratory failure with hypoxia: Secondary | ICD-10-CM | POA: Diagnosis not present

## 2023-01-07 LAB — CBC
HCT: 37.1 % (ref 36.0–46.0)
Hemoglobin: 11.5 g/dL — ABNORMAL LOW (ref 12.0–15.0)
MCH: 29.5 pg (ref 26.0–34.0)
MCHC: 31 g/dL (ref 30.0–36.0)
MCV: 95.1 fL (ref 80.0–100.0)
Platelets: 269 10*3/uL (ref 150–400)
RBC: 3.9 MIL/uL (ref 3.87–5.11)
RDW: 12.8 % (ref 11.5–15.5)
WBC: 9.6 10*3/uL (ref 4.0–10.5)
nRBC: 0 % (ref 0.0–0.2)

## 2023-01-07 LAB — BASIC METABOLIC PANEL
Anion gap: 8 (ref 5–15)
BUN: 28 mg/dL — ABNORMAL HIGH (ref 8–23)
CO2: 33 mmol/L — ABNORMAL HIGH (ref 22–32)
Calcium: 8.5 mg/dL — ABNORMAL LOW (ref 8.9–10.3)
Chloride: 98 mmol/L (ref 98–111)
Creatinine, Ser: 0.87 mg/dL (ref 0.44–1.00)
GFR, Estimated: >60 mL/min (ref >60)
Glucose, Bld: 122 mg/dL — ABNORMAL HIGH (ref 70–99)
Potassium: 4.2 mmol/L (ref 3.5–5.1)
Sodium: 139 mmol/L (ref 135–145)

## 2023-01-07 LAB — GLUCOSE, CAPILLARY
Glucose-Capillary: 76 mg/dL (ref 70–99)
Glucose-Capillary: 99 mg/dL (ref 70–99)
Glucose-Capillary: 277 mg/dL — ABNORMAL HIGH (ref 70–99)
Glucose-Capillary: 130 mg/dL — ABNORMAL HIGH (ref 70–99)

## 2023-01-07 LAB — CULTURE, BLOOD (ROUTINE X 2)
Culture: NO GROWTH
Culture: NO GROWTH
Special Requests: ADEQUATE
Special Requests: ADEQUATE

## 2023-01-07 LAB — LEGIONELLA PNEUMOPHILA SEROGP 1 UR AG: L. pneumophila Serogp 1 Ur Ag: NEGATIVE

## 2023-01-07 LAB — MAGNESIUM: Magnesium: 2.3 mg/dL (ref 1.7–2.4)

## 2023-01-07 NOTE — Telephone Encounter (Signed)
-----   Message from Lovenia Kim sent at 01/07/2023 12:01 PM EST ----- Regarding: new patient visit needed with flex ex lumbar Thanks team!

## 2023-01-07 NOTE — Plan of Care (Signed)
  Problem: Education: Goal: Ability to describe self-care measures that may prevent or decrease complications (Diabetes Survival Skills Education) will improve Outcome: Progressing Goal: Individualized Educational Video(s) Outcome: Progressing   Problem: Coping: Goal: Ability to adjust to condition or change in health will improve Outcome: Progressing   Problem: Fluid Volume: Goal: Ability to maintain a balanced intake and output will improve Outcome: Progressing   Problem: Health Behavior/Discharge Planning: Goal: Ability to identify and utilize available resources and services will improve Outcome: Progressing Goal: Ability to manage health-related needs will improve Outcome: Progressing   Problem: Metabolic: Goal: Ability to maintain appropriate glucose levels will improve Outcome: Progressing   Problem: Nutritional: Goal: Maintenance of adequate nutrition will improve Outcome: Progressing Goal: Progress toward achieving an optimal weight will improve Outcome: Progressing   Problem: Skin Integrity: Goal: Risk for impaired skin integrity will decrease Outcome: Progressing   Problem: Tissue Perfusion: Goal: Adequacy of tissue perfusion will improve Outcome: Progressing   Problem: Education: Goal: Knowledge of General Education information will improve Description: Including pain rating scale, medication(s)/side effects and non-pharmacologic comfort measures Outcome: Progressing   Problem: Health Behavior/Discharge Planning: Goal: Ability to manage health-related needs will improve Outcome: Progressing   Problem: Clinical Measurements: Goal: Ability to maintain clinical measurements within normal limits will improve Outcome: Progressing Goal: Will remain free from infection Outcome: Progressing Goal: Diagnostic test results will improve Outcome: Progressing Goal: Respiratory complications will improve Outcome: Progressing Goal: Cardiovascular complication will  be avoided Outcome: Progressing   Problem: Activity: Goal: Risk for activity intolerance will decrease Outcome: Progressing   Problem: Nutrition: Goal: Adequate nutrition will be maintained Outcome: Progressing   Problem: Coping: Goal: Level of anxiety will decrease Outcome: Progressing   Problem: Elimination: Goal: Will not experience complications related to bowel motility Outcome: Progressing Goal: Will not experience complications related to urinary retention Outcome: Progressing   Problem: Pain Management: Goal: General experience of comfort will improve Outcome: Progressing   Problem: Safety: Goal: Ability to remain free from injury will improve Outcome: Progressing   Problem: Skin Integrity: Goal: Risk for impaired skin integrity will decrease Outcome: Progressing   Problem: Education: Goal: Knowledge of disease or condition will improve Outcome: Progressing Goal: Knowledge of the prescribed therapeutic regimen will improve Outcome: Progressing Goal: Individualized Educational Video(s) Outcome: Progressing   Problem: Activity: Goal: Ability to tolerate increased activity will improve Outcome: Progressing Goal: Will verbalize the importance of balancing activity with adequate rest periods Outcome: Progressing   Problem: Respiratory: Goal: Ability to maintain a clear airway will improve Outcome: Progressing Goal: Levels of oxygenation will improve Outcome: Progressing Goal: Ability to maintain adequate ventilation will improve Outcome: Progressing   Problem: Activity: Goal: Ability to tolerate increased activity will improve Outcome: Progressing   Problem: Clinical Measurements: Goal: Ability to maintain a body temperature in the normal range will improve Outcome: Progressing   Problem: Respiratory: Goal: Ability to maintain adequate ventilation will improve Outcome: Progressing Goal: Ability to maintain a clear airway will improve Outcome:  Progressing

## 2023-01-07 NOTE — Progress Notes (Signed)
PROGRESS NOTE    [name]   LKG:[id number] DOB: 20-May-1950  DOA: 01/02/2023 Date of Service: 01/07/23 which is hospital day 4  PCP: Lorre Munroe, NP    HPI: [name] is a 72 y.o. female with medical history significant of COPD, hypertension, PAD with intermittent claudication, type 2 diabetes, hypothyroidism, who presented to the ED due to nausea and vomiting. Assoc w/ fever, abdominal pain and pain with urination. She notes that her abdominal symptoms started first, but then afterwards she began to experience a nonproductive cough with worsening shortness of breath.    Hospital course / significant events:  11/28: admitted to hospitalist service for CAP, COPD, AKI 11/29-11/30: improving, still needign O2 12/01: PT/OT recs for SNF. O2 requirement about the same 12/02: breathing stable/improved. SNF placement pending.   Consultants:  none  Procedures/Surgeries: none      ASSESSMENT & PLAN:   Acute respiratory failure with hypoxia Likely multifactorial in the setting of community-acquired pneumonia and COPD exacerbation Management of CAP and COPD as noted below Continue supplemental O2 to keep sats >=90%, wean as tolerated   Strep pneumo PNA Chest x-ray notable for RLL opacity in the setting of pulmonary symptoms consistent with CAP.  Strep pneumo antigen pos cont ceftriaxone   COPD with acute exacerbation  S/p Solu-Medrol 125 mg once cont prednisone 40 mg daily DuoNebs scheduled Continue home bronchodilators   UTI (urinary tract infection) 2/2 E coli Patient endorsing dysuria and suprapubic abdominal pain.   Urinalysis consistent with UTI. cont ceftriaxone   LLE numbness associated w/ low back pain - chronic  Lumbar disc disease XR lumbar spine abn - MRI confirms neural compression worst at L4/L5 Discussed w/ Dr Katrinka Blazing neurosurgery - he will have his office set up outpatinet follow up  PT/OT as tolerated at SNF  AKI (acute kidney injury) improved  In  the setting of poor p.o. intake, nausea with vomiting and underlying illness. Continue hydration Monitor BMP   Hypothyroidism Continue home Synthroid   Benign essential HTN BP intermittently soft but improving home BP meds held (COreg, Cardizem, Losartan), will restart Coreg today   DM (diabetes mellitus), type 2  Hold home regimen SSI, moderate   Anxiety and depression Continue home citalopram, Klonopin, doxepin      obesity based on BMI: Body mass index is 35.42 kg/m.  Underweight - under 18.5  normal weight - 18.5 to 24.9 overweight - 25 to 29.9 obese - 30 or more   DVT prophylaxis: lovenox IV fluids: no continuous IV fluids  Nutrition: cardiac/carb diet Central lines / invasive devices: none  Code Status: FULL CODE ACP documentation reviewed: 01/06/23 and none on file in [facility]  TOC needs: SNF rehab placement Barriers to dispo / significant pending items: placement              Subjective / Brief ROS:  Patient reports numbness in LLE and lower back pain about the same  Denies CP/SOB.  Pain controlled.  Denies new weakness.  Tolerating diet.  Reports no concerns w/ urination/defecation.   Family Communication: son at bedside on rounds    Objective Findings:  Vitals:   01/06/23 2016 01/07/23 0332 01/07/23 0741 01/07/23 1014  BP:  (!) 155/72 (!) 157/74 (!) 157/74  Pulse:  67 62 62  Resp:  18 18   Temp:  (!) 97.4 F (36.3 C) 97.7 F (36.5 C)   TempSrc:  Oral Oral   SpO2: 96% 94% 98%   Weight:  Height:       No intake or output data in the 24 hours ending 01/07/23 1218  Filed Weights   01/02/23 1750  Weight: 90.7 kg    Examination:  Physical Exam Constitutional:      General: She is not in acute distress.    Appearance: She is obese.  Cardiovascular:     Rate and Rhythm: Normal rate and regular rhythm.     Heart sounds: No murmur heard. Pulmonary:     Effort: Pulmonary effort is normal.     Breath sounds: Normal breath  sounds.  Abdominal:     Palpations: Abdomen is soft.  Musculoskeletal:     Right lower leg: No edema.     Left lower leg: No edema.     Comments: (+)straight leg raise LLE elicits pain in lumbar spine and hamstring area   Skin:    General: Skin is warm and dry.  Neurological:     Mental Status: She is alert.     Comments: Reports reduced sensatino lateral LLE distal to knee, strength is normal           Scheduled Medications:   amoxicillin-clavulanate  1 tablet Oral Q12H   aspirin EC  81 mg Oral Daily   carvedilol  3.125 mg Oral BID WC   citalopram  40 mg Oral Daily   clonazePAM  1 mg Oral TID   doxepin  50 mg Oral QHS   enoxaparin (LOVENOX) injection  45 mg Subcutaneous Q24H   fenofibrate  160 mg Oral Daily   fluticasone furoate-vilanterol  1 puff Inhalation Daily   insulin aspart  0-15 Units Subcutaneous TID WC   ipratropium-albuterol  3 mL Nebulization BID   levothyroxine  100 mcg Oral Q0600   pantoprazole  20 mg Oral Daily   rosuvastatin  20 mg Oral Daily   sodium chloride flush  3 mL Intravenous Q12H    Continuous Infusions:    PRN Medications:  acetaminophen **OR** acetaminophen, benzonatate, ondansetron **OR** ondansetron (ZOFRAN) IV  Antimicrobials from admission:  Anti-infectives (From admission, onward)    Start     Dose/Rate Route Frequency Ordered Stop   01/07/23 2200  amoxicillin-clavulanate (AUGMENTIN) 875-125 MG per tablet 1 tablet        1 tablet Oral Every 12 hours 01/06/23 0812 01/08/23 2159   01/04/23 2200  azithromycin (ZITHROMAX) tablet 500 mg  Status:  Discontinued        500 mg Oral Daily at bedtime 01/04/23 0753 01/04/23 1930   01/04/23 0000  cefTRIAXone (ROCEPHIN) 2 g in sodium chloride 0.9 % 100 mL IVPB  Status:  Discontinued        2 g 200 mL/hr over 30 Minutes Intravenous Every 24 hours 01/03/23 0354 01/03/23 0905   01/03/23 2200  cefTRIAXone (ROCEPHIN) 2 g in sodium chloride 0.9 % 100 mL IVPB        2 g 200 mL/hr over 30 Minutes  Intravenous Every 24 hours 01/03/23 0905 01/07/23 1015   01/03/23 2200  azithromycin (ZITHROMAX) 500 mg in sodium chloride 0.9 % 250 mL IVPB  Status:  Discontinued        500 mg 250 mL/hr over 60 Minutes Intravenous Every 24 hours 01/03/23 0905 01/04/23 0753   01/03/23 0400  cefTRIAXone (ROCEPHIN) 1 g in sodium chloride 0.9 % 100 mL IVPB        1 g 200 mL/hr over 30 Minutes Intravenous  Once 01/03/23 0359 01/03/23 0540   01/03/23 0100  cefTRIAXone (  ROCEPHIN) 1 g in sodium chloride 0.9 % 100 mL IVPB        1 g 200 mL/hr over 30 Minutes Intravenous  Once 01/03/23 0055 01/03/23 0150   01/03/23 0100  azithromycin (ZITHROMAX) 500 mg in sodium chloride 0.9 % 250 mL IVPB        500 mg 250 mL/hr over 60 Minutes Intravenous  Once 01/03/23 0055 01/03/23 0220           Data Reviewed:  I have personally reviewed the following...  CBC: Recent Labs  Lab 01/02/23 1753 01/04/23 0611 01/07/23 0405  WBC 9.2 9.8 9.6  NEUTROABS 7.3 8.0*  --   HGB 12.2 11.0* 11.5*  HCT 38.2 35.7* 37.1  MCV 92.3 94.9 95.1  PLT 219 211 269   Basic Metabolic Panel: Recent Labs  Lab 01/02/23 1753 01/04/23 0611 01/05/23 0459 01/07/23 0405  NA 131* 138 140 139  K 3.8 3.8 4.6 4.2  CL 97* 103 101 98  CO2 24 29 32 33*  GLUCOSE 224* 166* 118* 122*  BUN 29* 35* 32* 28*  CREATININE 1.36* 1.15* 1.10* 0.87  CALCIUM 8.4* 8.5* 8.7* 8.5*  MG  --   --  2.5* 2.3   GFR: Estimated Creatinine Clearance: 61.6 mL/min (by C-G formula based on SCr of 0.87 mg/dL). Liver Function Tests: Recent Labs  Lab 01/02/23 1753  AST 29  ALT 14  ALKPHOS 58  BILITOT 0.9  PROT 6.9  ALBUMIN 3.1*   No results for input(s): "LIPASE", "AMYLASE" in the last 168 hours. No results for input(s): "AMMONIA" in the last 168 hours. Coagulation Profile: Recent Labs  Lab 01/02/23 1753  INR 1.2   Cardiac Enzymes: No results for input(s): "CKTOTAL", "CKMB", "CKMBINDEX", "TROPONINI" in the last 168 hours. BNP (last 3 results) No  results for input(s): "PROBNP" in the last 8760 hours. HbA1C: No results for input(s): "HGBA1C" in the last 72 hours. CBG: Recent Labs  Lab 01/06/23 1208 01/06/23 1713 01/06/23 2243 01/07/23 0742 01/07/23 1151  GLUCAP 96 239* 113* 76 99   Lipid Profile: No results for input(s): "CHOL", "HDL", "LDLCALC", "TRIG", "CHOLHDL", "LDLDIRECT" in the last 72 hours. Thyroid Function Tests: No results for input(s): "TSH", "T4TOTAL", "FREET4", "T3FREE", "THYROIDAB" in the last 72 hours. Anemia Panel: No results for input(s): "VITAMINB12", "FOLATE", "FERRITIN", "TIBC", "IRON", "RETICCTPCT" in the last 72 hours. Most Recent Urinalysis On File:     Component Value Date/Time   COLORURINE AMBER (A) 01/03/2023 0053   APPEARANCEUR CLOUDY (A) 01/03/2023 0053   APPEARANCEUR Clear 07/26/2016 1445   LABSPEC 1.018 01/03/2023 0053   LABSPEC 1.012 10/10/2011 1219   PHURINE 5.0 01/03/2023 0053   GLUCOSEU NEGATIVE 01/03/2023 0053   GLUCOSEU Negative 10/10/2011 1219   HGBUR MODERATE (A) 01/03/2023 0053   BILIRUBINUR NEGATIVE 01/03/2023 0053   BILIRUBINUR Negative 08/28/2022 1537   BILIRUBINUR Negative 07/26/2016 1445   BILIRUBINUR Negative 10/10/2011 1219   KETONESUR NEGATIVE 01/03/2023 0053   PROTEINUR 100 (A) 01/03/2023 0053   UROBILINOGEN 0.2 08/28/2022 1537   NITRITE NEGATIVE 01/03/2023 0053   LEUKOCYTESUR LARGE (A) 01/03/2023 0053   LEUKOCYTESUR 2+ 10/10/2011 1219   Sepsis Labs: @LABRCNTIP (procalcitonin:4,lacticidven:4) Microbiology: Recent Results (from the past 240 hour(s))  Culture, blood (Routine x 2)     Status: None   Collection Time: 01/02/23  5:53 PM   Specimen: BLOOD  Result Value Ref Range Status   Specimen Description BLOOD BLOOD LEFT ARM  Final   Special Requests   Final    BOTTLES DRAWN  AEROBIC AND ANAEROBIC Blood Culture adequate volume   Culture   Final    NO GROWTH 5 DAYS Performed at [facility], [address] [place], [place] [address]    Report Status  01/07/2023 FINAL  Final  Culture, blood (Routine x 2)     Status: None   Collection Time: 01/02/23  6:00 PM   Specimen: BLOOD  Result Value Ref Range Status   Specimen Description BLOOD BLOOD RIGHT ARM  Final   Special Requests   Final    BOTTLES DRAWN AEROBIC AND ANAEROBIC Blood Culture adequate volume   Culture   Final    NO GROWTH 5 DAYS Performed at [facility], [address], [place], [place] [address]    Report Status 01/07/2023 FINAL  Final  Resp panel by RT-PCR (RSV, Flu A&B, Covid) Anterior Nasal Swab     Status: None   Collection Time: 01/03/23 12:53 AM   Specimen: Anterior Nasal Swab  Result Value Ref Range Status   SARS Coronavirus 2 by RT PCR NEGATIVE NEGATIVE Final    Comment: (NOTE) SARS-CoV-2 target nucleic acids are NOT DETECTED.  The SARS-CoV-2 RNA is generally detectable in upper respiratory specimens during the acute phase of infection. The lowest concentration of SARS-CoV-2 viral copies this assay can detect is 138 copies/mL. A negative result does not preclude SARS-Cov-2 infection and should not be used as the sole basis for treatment or other patient management decisions. A negative result may occur with  improper specimen collection/handling, submission of specimen other than nasopharyngeal swab, presence of viral mutation(s) within the areas targeted by this assay, and inadequate number of viral copies(<138 copies/mL). A negative result must be combined with clinical observations, patient history, and epidemiological information. The expected result is Negative.  Fact Sheet for Patients:  BloggerCourse.com  Fact Sheet for Healthcare Providers:  SeriousBroker.it  This test is no t yet approved or cleared by the [place] FDA and  has been authorized for detection and/or diagnosis of SARS-CoV-2 by FDA under an Emergency Use Authorization (EUA). This EUA will remain  in effect (meaning  this test can be used) for the duration of the COVID-19 declaration under Section 564(b)(1) of the Act, 21 U.S.C.section 360bbb-3(b)(1), unless the authorization is terminated  or revoked sooner.       Influenza A by PCR NEGATIVE NEGATIVE Final   Influenza B by PCR NEGATIVE NEGATIVE Final    Comment: (NOTE) The Xpert Xpress SARS-CoV-2/FLU/RSV plus assay is intended as an aid in the diagnosis of influenza from Nasopharyngeal swab specimens and should not be used as a sole basis for treatment. Nasal washings and aspirates are unacceptable for Xpert Xpress SARS-CoV-2/FLU/RSV testing.  Fact Sheet for Patients: BloggerCourse.com  Fact Sheet for Healthcare Providers: SeriousBroker.it  This test is not yet approved or cleared by the [place] FDA and has been authorized for detection and/or diagnosis of SARS-CoV-2 by FDA under an Emergency Use Authorization (EUA). This EUA will remain in effect (meaning this test can be used) for the duration of the COVID-19 declaration under Section 564(b)(1) of the Act, 21 U.S.C. section 360bbb-3(b)(1), unless the authorization is terminated or revoked.     Resp Syncytial Virus by PCR NEGATIVE NEGATIVE Final    Comment: (NOTE) Fact Sheet for Patients: BloggerCourse.com  Fact Sheet for Healthcare Providers: SeriousBroker.it  This test is not yet approved or cleared by the [place] FDA and has been authorized for detection and/or diagnosis of SARS-CoV-2 by FDA under an Emergency  Use Authorization (EUA). This EUA will remain in effect (meaning this test can be used) for the duration of the COVID-19 declaration under Section 564(b)(1) of the Act, 21 U.S.C. section 360bbb-3(b)(1), unless the authorization is terminated or revoked.  Performed at [facility], [address] [place], [place] [address]   Urine Culture     Status:  Abnormal   Collection Time: 01/03/23 12:53 AM   Specimen: Urine, Random  Result Value Ref Range Status   Specimen Description   Final    URINE, RANDOM Performed at [facility], [address] [place], [place] [address]    Special Requests   Final    NONE Performed at [facility], [address] [place], [place] [address]    Culture 50,000 COLONIES/mL ESCHERICHIA COLI (A)  Final   Report Status 01/05/2023 FINAL  Final   Organism ID, Bacteria ESCHERICHIA COLI (A)  Final      Susceptibility   Escherichia coli - MIC*    AMPICILLIN <=2 SENSITIVE Sensitive     CEFAZOLIN <=4 SENSITIVE Sensitive     CEFEPIME <=0.12 SENSITIVE Sensitive     CEFTRIAXONE <=0.25 SENSITIVE Sensitive     CIPROFLOXACIN <=0.25 SENSITIVE Sensitive     GENTAMICIN <=1 SENSITIVE Sensitive     IMIPENEM <=0.25 SENSITIVE Sensitive     NITROFURANTOIN <=16 SENSITIVE Sensitive     TRIMETH/SULFA <=20 SENSITIVE Sensitive     AMPICILLIN/SULBACTAM <=2 SENSITIVE Sensitive     PIP/TAZO <=4 SENSITIVE Sensitive ug/mL    * 50,000 COLONIES/mL ESCHERICHIA COLI  Respiratory (~20 pathogens) panel by PCR     Status: None   Collection Time: 01/03/23 10:40 AM   Specimen: Nasopharyngeal Swab; Respiratory  Result Value Ref Range Status   Adenovirus NOT DETECTED NOT DETECTED Final   Coronavirus 229E NOT DETECTED NOT DETECTED Final    Comment: (NOTE) The Coronavirus on the Respiratory Panel, DOES NOT test for the novel  Coronavirus (2019 nCoV)    Coronavirus HKU1 NOT DETECTED NOT DETECTED Final   Coronavirus NL63 NOT DETECTED NOT DETECTED Final   Coronavirus OC43 NOT DETECTED NOT DETECTED Final   Metapneumovirus NOT DETECTED NOT DETECTED Final   Rhinovirus / Enterovirus NOT DETECTED NOT DETECTED Final   Influenza A NOT DETECTED NOT DETECTED Final   Influenza B NOT DETECTED NOT DETECTED Final   Parainfluenza Virus 1 NOT DETECTED NOT DETECTED Final   Parainfluenza Virus 2 NOT DETECTED NOT DETECTED Final    Parainfluenza Virus 3 NOT DETECTED NOT DETECTED Final   Parainfluenza Virus 4 NOT DETECTED NOT DETECTED Final   Respiratory Syncytial Virus NOT DETECTED NOT DETECTED Final   Bordetella pertussis NOT DETECTED NOT DETECTED Final   Bordetella Parapertussis NOT DETECTED NOT DETECTED Final   Chlamydophila pneumoniae NOT DETECTED NOT DETECTED Final   Mycoplasma pneumoniae NOT DETECTED NOT DETECTED Final    Comment: Performed at [facility], [address], [place], [place] [address]      Radiology Studies last 3 days: MR LUMBAR SPINE WO CONTRAST  Result Date: 01/07/2023 CLINICAL DATA:  Provided history: Myelopathy, acute, lumbar spine. Low back pain, spondyloarthropathy suspected, x-ray done. EXAM: MRI LUMBAR SPINE WITHOUT CONTRAST TECHNIQUE: Multiplanar, multisequence MR imaging of the lumbar spine was performed. No intravenous contrast was administered. COMPARISON:  Lumbar spine radiographs 01/06/2023. Lumbar spine MRI 08/19/2006. FINDINGS: Segmentation: 5 lumbar vertebrae. The caudal most well-formed intervertebral disc space is designated L5-S1. Alignment: 4 mm L2-L3 grade 1 retrolisthesis. Slight L3-L4 grade 1 retrolisthesis. Slight grade  1 anterolisthesis at L5-S1. Vertebrae: No lumbar vertebral compression fracture. Multilevel degenerative endplate irregularity. Mild degenerative plate edema at G9-F6 and L4-L5. Conus medullaris and cauda equina: Conus extends to the L2 level. No signal abnormality identified within the visualized distal spinal cord. Paraspinal and other soft tissues: 1.7 cm left adrenal nodule, unchanged from the prior lumbar spine MRI of 08/19/2006 and benign. No follow-up imaging recommended. No acute finding within included portions of the abdomen/retroperitoneum. Atrophy of the lumbar paraspinal musculature Disc levels: Unless otherwise stated, the level by level findings below have not significantly changed from the prior MRI of 08/19/2006. Progressive multilevel disc  degeneration. Disc degeneration is greatest at L1-L2 (moderate to advanced), L2-L3 (moderate to advanced) and L3-L4 (moderate). T11-T12: This level is imaged in the sagittal plane only. Disc bulge. Facet arthrosis and ligamentum flavum hypertrophy. Mild spinal canal narrowing. Mild right neural foraminal narrowing. T12-L1: Disc bulge. Facet arthrosis. The disc bulge mildly effaces the ventral thecal sac. No significant foraminal stenosis. L1-L2: A disc bulge mildly effaces the ventral thecal sac. No significant foraminal stenosis. L2-L3: 4 mm grade 1 retrolisthesis, new from the prior MRI. Disc bulge. Facet and ligamentum flavum hypertrophy, progressed. Mild spinal canal narrowing, progressed. Mild relative inferior neural foraminal narrowing, new on the right. L3-L4: Slight grade 1 retrolisthesis, new. Disc bulge. Advanced facet arthrosis with ligamentum flavum hypertrophy, progressed. Progressive moderate-to-severe central canal stenosis. Mild bilateral neural foraminal narrowing. L4-L5: Interval posterior decompression surgery. Progressive disc bulge. New large superimposed left subarticular disc extrusion with caudal migration to the lower L5 vertebral body level (for instance as seen on series 9, image 9). Advanced facet arthropathy. The disc extrusion contributes to severe left subarticular/lateral recess stenosis, encroaching upon descending left-sided nerve roots (most notably the descending left L5 nerve root). Moderate to moderately severe central canal stenosis, progressed. Moderate bilateral neural foraminal narrowing (greater on the left), progressed. L5-S1: Interval posterior decompression surgery. Slight grade 1 anterolisthesis. Disc bulge. Advanced facet arthrosis, progressed. Right subarticular stenosis with potential to affect the descending right S1 nerve root. Moderate central canal stenosis, slightly progressed. Mild relative bilateral neural foraminal narrowing, progressed. IMPRESSION: 1.  Comparison is made to the prior lumbar spine MRI of 08/19/2006. 2. Spondylosis at the lumbar and visualized lower thoracic levels as outlined within the body of the report. 3. At L4-L5, there has been interval posterior decompression surgery. Progressive disc bulge. New large superimposed left subarticular disc extrusion with caudal migration to the lower L5 vertebral body level. Advanced facet arthropathy. The disc extrusion contributes to severe left subarticular/lateral recess stenosis, encroaching upon descending left-sided nerve roots (most notably the descending left L5 nerve root). Moderate to moderately severe central canal stenosis, progressed. Moderate bilateral neural foraminal narrowing (greater on the left), progressed. 4. At L3-L4, there is progressive multifactorial moderate-to-severe central canal stenosis. Mild bilateral neural foraminal narrowing. 5. At L5-S1, there is multifactorial right subarticular stenosis with potential to affect the descending right S1 nerve root. Moderate central canal stenosis, progressed. Mild bilateral neural foraminal narrowing, progressed. 6. No more than mild spinal canal or neural foraminal narrowing at the remaining levels. 7. Disc degeneration is greatest at L1-L2 (moderate-to-advanced), L2-L3 (moderate) and L3-L4 (moderate). 8. Mild degenerative endplate edema at O1-H0 and L4-L5. 9. Grade 1 spondylolisthesis at L2-L3, L3-L4 and L5-S1. Electronically Signed   By: Jackey Loge D.O.   On: 01/07/2023 10:47   DG Lumbar Spine 2-3 Views  Result Date: 01/06/2023 CLINICAL DATA:  [id number] Low back pain [id number] 8821 Sciatica 8821 EXAM:  LUMBAR SPINE - 2-3 VIEW COMPARISON:  CT lumbar spine 12/02/2015 FINDINGS: Limited evaluation due to overlapping osseous structures and overlying soft tissues. Multilevel severe degenerative changes of the spine. Radiographic findings suggestive of multilevel severe osseous neural foraminal stenosis. There is no evidence of lumbar spine  fracture. Alignment is normal. Multilevel intervertebral disc space vacuum phenomenon. Atherosclerotic plaque. Stool throughout the colon. IMPRESSION: 1. No acute displaced fracture or traumatic listhesis of the lumbar spine. 2. Multilevel severe degenerative changes of the spine. Radiographic findings suggestive of multilevel severe osseous neural foraminal stenosis. Limited evaluation due to overlapping osseous structures and overlying soft tissues. 3. Stool throughout the colon-correlate for constipation. 4.  Aortic Atherosclerosis (ICD10-I70.0). Electronically Signed   By: Tish Frederickson M.D.   On: 01/06/2023 19:47   US ARTERIAL ABI (SCREENING LOWER EXTREMITY)  Result Date: 01/06/2023 CLINICAL DATA:  Left foot pain Former smoker Hypertension Hyperlipidemia EXAM: NONINVASIVE PHYSIOLOGIC VASCULAR STUDY OF BILATERAL LOWER EXTREMITIES TECHNIQUE: Evaluation of both lower extremities were performed at rest, including calculation of ankle-brachial indices with single level pressure measurements and doppler recording. COMPARISON:  None available. FINDINGS: Right ABI:  0.85 Left ABI:  1.00 Right Lower Extremity:  Normal arterial waveforms at the ankle. Left Lower Extremity:  Normal arterial waveforms at the ankle. 0.8-0.89 Mild PAD IMPRESSION: 1. Mild right lower extremity peripheral arterial disease. 2. No significant abnormality of the left lower extremity. Electronically Signed   By: Acquanetta Belling M.D.   On: 01/06/2023 06:47         Sunnie Nielsen, DO [facility] 01/07/2023, 12:18 PM    Dictation software may have been used to generate the above note. Typos may occur and escape review in typed/dictated notes. Please contact Dr Lyn Hollingshead directly for clarity if needed.  Staff may message me via secure chat in Epic  but this may not receive an immediate response,  please page me for urgent matters!  If 7PM-7AM, please contact night coverage [email or web address]

## 2023-01-07 NOTE — TOC Progression Note (Addendum)
Transition of Care [facility]) - Progression Note    Patient Details  Name: [name] MRN: [id number] Date of Birth: 04/03/1950  Transition of Care [facility]) CM/SW Contact  Liliana Cline, LCSW Phone Number: 01/07/2023, 9:57 AM  Clinical Narrative:    Asked Tiffany at [facility] to review the referral.  12:25- Per DO, patient medically ready for SNF. Called [facility] to start auth, left VM requesting a return call.  2:05- Spoke with Judeth Cornfield from [facility] to start auth for SNF [employer]) and [employer].   Expected Discharge Plan: Home/Self Care Barriers to Discharge: Continued Medical Work up  Expected Discharge Plan and Services       Living arrangements for the past 2 months: Single Family Home                                       Social Determinants of Health (SDOH) Interventions SDOH Screenings   Food Insecurity: No Food Insecurity (01/04/2023)  Housing: Low Risk  (01/04/2023)  Transportation Needs: No Transportation Needs (01/04/2023)  Utilities: Not At Risk (01/04/2023)  Alcohol Screen: Low Risk  (08/28/2022)  Depression (PHQ2-9): Medium Risk (08/28/2022)  Financial Resource Strain: Low Risk  (04/27/2022)  Physical Activity: Inactive (04/27/2022)  Social Connections: Moderately Isolated (04/27/2022)  Stress: No Stress Concern Present (04/27/2022)  Tobacco Use: Medium Risk (01/04/2023)    Readmission Risk Interventions    01/05/2023   12:33 PM  Readmission Risk Prevention Plan  Transportation Screening Complete  PCP or Specialist Appt within 3-5 Days Complete  HRI or Home Care Consult Complete  Social Work Consult for Recovery Care Planning/Counseling Complete  Palliative Care Screening Not Applicable  Medication Review Oceanographer) Complete

## 2023-01-07 NOTE — Care Management Important Message (Signed)
Important Message  Patient Details  Name: [name] MRN: [id number] Date of Birth: 1950-10-28   Important Message Given:  Yes - Medicare IM     Verita Schneiders Finnian Husted 01/07/2023, 2:41 PM

## 2023-01-07 NOTE — Telephone Encounter (Signed)
Patient is still admitted, will contact once discharged.

## 2023-01-08 LAB — GLUCOSE, CAPILLARY
Glucose-Capillary: 64 mg/dL — ABNORMAL LOW (ref 70–99)
Glucose-Capillary: 108 mg/dL — ABNORMAL HIGH (ref 70–99)
Glucose-Capillary: 93 mg/dL (ref 70–99)
Glucose-Capillary: 132 mg/dL — ABNORMAL HIGH (ref 70–99)
Glucose-Capillary: 166 mg/dL — ABNORMAL HIGH (ref 70–99)

## 2023-01-08 NOTE — Progress Notes (Signed)
Physical Therapy Treatment Patient Details Name: [name] MRN: [id number] DOB: 10/16/1950 Today's Date: 01/08/2023   History of Present Illness Pt is a 72 y.o. female with medical history significant of COPD, hypertension, PAD with intermittent claudication, type 2 diabetes, hypothyroidism, who presented to the ED due to nausea and vomiting. Pt found to have ARF and PNA.    PT Comments  Pt received in bed on 2L of O2 [place] and agreed to PT session. Pt reported sensing continued numbness in left foot. Pt performed bed mobility MinA for LLE management, STS with the use of RW (2wheels) CGA, and amb ~37ft CGA prior to ending session in bed due to fatigue. Pt did not experience s/sx relative to dizziness with any of the mobility performed in today's session. Vitals remained appropriate on 2L of O2 with all mobility, reading 95-98% SpO2 on 2L of O2 [place]. Pt tolerated Tx well and will continue to benefit from skilled PT sessions to improve strength, endurance, functional mobility, and activity tolerance to maximize safety/return to PLOF following D/C.    If plan is discharge home, recommend the following: A little help with walking and/or transfers;A little help with bathing/dressing/bathroom;Assistance with cooking/housework;Assist for transportation;Help with stairs or ramp for entrance   Can travel by private vehicle     No  Equipment Recommendations  Other (comment) (TBD at next facility)    Recommendations for Other Services       Precautions / Restrictions Precautions Precautions: Fall Precaution Comments: watch O2 Restrictions Weight Bearing Restrictions: No     Mobility  Bed Mobility Overal bed mobility: Needs Assistance Bed Mobility: Supine to Sit, Sit to Supine     Supine to sit: Min assist, HOB elevated, Used rails Sit to supine: Min assist, HOB elevated, Used rails   General bed mobility comments: Pt performed bed mobility MinA for LLE management.    Transfers Overall  transfer level: Needs assistance Equipment used: Rolling walker (2 wheels) Transfers: Sit to/from Stand Sit to Stand: Contact guard assist           General transfer comment: Pt performed STS with the use of RW (2wheels) CGA. Pt did not report any s/sx relative to dizziness    Ambulation/Gait Ambulation/Gait assistance: Contact guard assist Gait Distance (Feet): 40 Feet Assistive device: Rolling walker (2 wheels) Gait Pattern/deviations: Step-to pattern Gait velocity: decreased     General Gait Details: Pt amb with the use of RW (2wheels) CGA. Pt reported sensing continued numbness and difficulty lifting her foot off of the ground when taking a step with left foot   Stairs             Wheelchair Mobility     Tilt Bed    Modified Rankin (Stroke Patients Only)       Balance Overall balance assessment: Needs assistance Sitting-balance support: Feet supported Sitting balance-Leahy Scale: Fair     Standing balance support: Bilateral upper extremity supported, Reliant on assistive device for balance Standing balance-Leahy Scale: Fair                              Cognition Arousal: Alert Behavior During Therapy: WFL for tasks assessed/performed Overall Cognitive Status: Within Functional Limits for tasks assessed                                 General Comments: AOx4. Pt pleasant and  willing to participate in PT session        Exercises      General Comments        Pertinent Vitals/Pain Pain Assessment Pain Assessment: Faces Faces Pain Scale: Hurts a little bit Pain Location: Left foot Pain Descriptors / Indicators: Tingling, Numbness Pain Intervention(s): Monitored during session    Home Living                          Prior Function            PT Goals (current goals can now be found in the care plan section) Acute Rehab PT Goals Patient Stated Goal: to get stronger before returning home PT Goal  Formulation: With patient Time For Goal Achievement: 01/20/23 Potential to Achieve Goals: Good Progress towards PT goals: Progressing toward goals    Frequency    Min 1X/week      PT Plan      Co-evaluation              AM-PAC PT "6 Clicks" Mobility   Outcome Measure  Help needed turning from your back to your side while in a flat bed without using bedrails?: A Lot Help needed moving from lying on your back to sitting on the side of a flat bed without using bedrails?: A Lot Help needed moving to and from a bed to a chair (including a wheelchair)?: A Little Help needed standing up from a chair using your arms (e.g., wheelchair or bedside chair)?: A Little Help needed to walk in hospital room?: A Little Help needed climbing 3-5 steps with a railing? : A Lot 6 Click Score: 15    End of Session Equipment Utilized During Treatment: Oxygen;Gait belt Activity Tolerance: Patient limited by fatigue;Patient tolerated treatment well Patient left: in bed;with call bell/phone within reach;with bed alarm set Nurse Communication: Mobility status PT Visit Diagnosis: Other abnormalities of gait and mobility (R26.89)     Time: 6387-5643 PT Time Calculation (min) (ACUTE ONLY): 16 min  Charges:    $Gait Training: 8-22 mins PT General Charges $$ ACUTE PT VISIT: 1 Visit                     Jamille Fisher [place] Howard SPT, LAT, ATC  Kiva Norland Sauvignon-Howard 01/08/2023, 4:45 PM

## 2023-01-08 NOTE — TOC Progression Note (Signed)
Transition of Care [facility]) - Progression Note    Patient Details  Name: VERONA SINKFIELD MRN: [id number] Date of Birth: October 04, 1950  Transition [facility] Care [facility]) CM/SW Contact  Chapman Fitch, RN Phone Number: 01/08/2023, 3:46 PM  Clinical Narrative:      Per Meagan with TOC approved for SNF [phone] and EMS [phone].   Notified that [employer] admissions office is closed to.  Message sent to Tiffany at [facility] to confirm.  No response.  MD notified   Expected Discharge Plan: Home/Self Care Barriers to Discharge: Continued Medical Work up  Expected Discharge Plan and Services       Living arrangements for the past 2 months: Single Family Home                                       Social Determinants of Health (SDOH) Interventions SDOH Screenings   Food Insecurity: No Food Insecurity (01/04/2023)  Housing: Low Risk  (01/04/2023)  Transportation Needs: No Transportation Needs (01/04/2023)  Utilities: Not At Risk (01/04/2023)  Alcohol Screen: Low Risk  (08/28/2022)  Depression (PHQ2-9): Medium Risk (08/28/2022)  Financial Resource Strain: Low Risk  (04/27/2022)  Physical Activity: Inactive (04/27/2022)  Social Connections: Moderately Isolated (04/27/2022)  Stress: No Stress Concern Present (04/27/2022)  Tobacco Use: Medium Risk (01/04/2023)    Readmission Risk Interventions    01/05/2023   12:33 PM  Readmission Risk Prevention Plan  Transportation Screening Complete  PCP or Specialist Appt within 3-5 Days Complete  HRI or Home Care Consult Complete  Social Work Consult for Recovery Care Planning/Counseling Complete  Palliative Care Screening Not Applicable  Medication Review Oceanographer) Complete

## 2023-01-08 NOTE — Progress Notes (Addendum)
PROGRESS NOTE    [name]   ZOX:[id number] DOB: [birth year]  DOA: 01/02/2023 Date of Service: 01/08/23 which is hospital day 5  PCP: Lorre Munroe, NP    HPI: [name] is a 72 y.o. female with medical history significant of COPD, hypertension, PAD with intermittent claudication, type 2 diabetes, hypothyroidism, who presented to the ED due to nausea and vomiting. Assoc w/ fever, abdominal pain and pain with urination. She notes that her abdominal symptoms started first, but then afterwards she began to experience a nonproductive cough with worsening shortness of breath.    Hospital course / significant events:  11/28: admitted to hospitalist service for CAP, COPD, AKI 11/29-11/30: improving, still needign O2 12/01: PT/OT recs for SNF. O2 requirement about the same 12/02-12/03: breathing stable/improved. SNF placement pending.   Consultants:  none  Procedures/Surgeries: none      ASSESSMENT & PLAN:   Acute respiratory failure with hypoxia Likely multifactorial in the setting of community-acquired pneumonia and COPD exacerbation Management of CAP and COPD as noted below Continue supplemental O2 to keep sats >=90%, wean as tolerated   Strep pneumo PNA Chest x-ray notable for RLL opacity in the setting of pulmonary symptoms consistent with CAP.  Strep pneumo antigen pos cont ceftriaxone   COPD with acute exacerbation  S/p Solu-Medrol 125 mg once cont prednisone 40 mg daily DuoNebs scheduled Continue home bronchodilators   UTI (urinary tract infection) 2/2 E coli Patient endorsing dysuria and suprapubic abdominal pain.   Urinalysis consistent with UTI. Completed Ceftriaxone    LLE numbness associated w/ low back pain - chronic  Lumbar disc disease XR lumbar spine abn - MRI confirms neural compression worst at L4/L5 Discussed w/ Dr Katrinka Blazing neurosurgery - he will have his office set up outpatinet follow up  PT/OT as tolerated at SNF  AKI (acute kidney injury)  resolved  In the setting of poor p.o. intake, nausea with vomiting and underlying illness. Continue hydration Monitor BMP periodically   Hypothyroidism Continue home Synthroid   Benign essential HTN BP intermittently soft but improving home BP meds held (COreg, Cardizem, Losartan), will restart Coreg today   DM (diabetes mellitus), type 2 well controlled No home meds, d/c SSI here  Follow outpatient   Anxiety and depression Continue home citalopram, Klonopin, doxepin      obesity based on BMI: Body mass index is 35.42 kg/m.  Underweight - under 18.5  normal weight - 18.5 to 24.9 overweight - 25 to 29.9 obese - 30 or more   DVT prophylaxis: lovenox IV fluids: no continuous IV fluids  Nutrition: cardiac/carb diet Central lines / invasive devices: none  Code Status: FULL CODE ACP documentation reviewed: 01/06/23 and none on file in [facility]  [facility] needs: SNF rehab placement Barriers to dispo / significant pending items: placement - [facility] is approved but apparently admissions office was closed today so could not accept patient, hopefully can d/c tomorrow              Subjective / Brief ROS:  Patient reports no concerns   Denies CP/SOB.  Pain controlled.  Denies new weakness.  Tolerating diet.  Reports no concerns w/ urination/defecation.   Family Communication: none at this time     Objective Findings:  Vitals:   01/07/23 1706 01/07/23 1949 01/08/23 0415 01/08/23 0756  BP: (!) 145/72 136/64 (!) 140/93 (!) 167/65  Pulse: 61 (!) 58 63 (!) 58  Resp: 18 20 20 16   Temp: 98.6 F (37 C) 97.7  F (36.5 C) 97.7 F (36.5 C) 97.6 F (36.4 C)  TempSrc:  Oral Oral Oral  SpO2: 98% 96% 92% 93%  Weight:      Height:       No intake or output data in the 24 hours ending 01/08/23 1607  Filed Weights   01/02/23 1750  Weight: 90.7 kg    Examination:  Physical Exam Constitutional:      General: She is not in acute distress.    Appearance: She is obese.   Cardiovascular:     Rate and Rhythm: Normal rate and regular rhythm.     Heart sounds: No murmur heard. Pulmonary:     Effort: Pulmonary effort is normal.     Breath sounds: Normal breath sounds.  Abdominal:     Palpations: Abdomen is soft.  Musculoskeletal:     Right lower leg: No edema.     Left lower leg: No edema.     Comments: (+)straight leg raise LLE elicits pain in lumbar spine and hamstring area   Skin:    General: Skin is warm and dry.  Neurological:     Mental Status: She is alert.     Comments: Reports reduced sensatino lateral LLE distal to knee, strength is normal           Scheduled Medications:   aspirin EC  81 mg Oral Daily   carvedilol  3.125 mg Oral BID WC   citalopram  40 mg Oral Daily   clonazePAM  1 mg Oral TID   doxepin  50 mg Oral QHS   enoxaparin (LOVENOX) injection  45 mg Subcutaneous Q24H   fenofibrate  160 mg Oral Daily   fluticasone furoate-vilanterol  1 puff Inhalation Daily   ipratropium-albuterol  3 mL Nebulization BID   levothyroxine  100 mcg Oral Q0600   pantoprazole  20 mg Oral Daily   rosuvastatin  20 mg Oral Daily   sodium chloride flush  3 mL Intravenous Q12H    Continuous Infusions:    PRN Medications:  acetaminophen **OR** acetaminophen, benzonatate, ondansetron **OR** ondansetron (ZOFRAN) IV  Antimicrobials from admission:  Anti-infectives (From admission, onward)    Start     Dose/Rate Route Frequency Ordered Stop   01/07/23 2200  amoxicillin-clavulanate (AUGMENTIN) 875-125 MG per tablet 1 tablet        1 tablet Oral Every 12 hours 01/06/23 0812 01/08/23 0854   01/04/23 2200  azithromycin (ZITHROMAX) tablet 500 mg  Status:  Discontinued        500 mg Oral Daily at bedtime 01/04/23 0753 01/04/23 1930   01/04/23 0000  cefTRIAXone (ROCEPHIN) 2 g in sodium chloride 0.9 % 100 mL IVPB  Status:  Discontinued        2 g 200 mL/hr over 30 Minutes Intravenous Every 24 hours 01/03/23 0354 01/03/23 0905   01/03/23 2200   cefTRIAXone (ROCEPHIN) 2 g in sodium chloride 0.9 % 100 mL IVPB        2 g 200 mL/hr over 30 Minutes Intravenous Every 24 hours 01/03/23 0905 01/07/23 1015   01/03/23 2200  azithromycin (ZITHROMAX) 500 mg in sodium chloride 0.9 % 250 mL IVPB  Status:  Discontinued        500 mg 250 mL/hr over 60 Minutes Intravenous Every 24 hours 01/03/23 0905 01/04/23 0753   01/03/23 0400  cefTRIAXone (ROCEPHIN) 1 g in sodium chloride 0.9 % 100 mL IVPB        1 g 200 mL/hr over 30 Minutes Intravenous  Once 01/03/23 0359 01/03/23 0540   01/03/23 0100  cefTRIAXone (ROCEPHIN) 1 g in sodium chloride 0.9 % 100 mL IVPB        1 g 200 mL/hr over 30 Minutes Intravenous  Once 01/03/23 0055 01/03/23 0150   01/03/23 0100  azithromycin (ZITHROMAX) 500 mg in sodium chloride 0.9 % 250 mL IVPB        500 mg 250 mL/hr over 60 Minutes Intravenous  Once 01/03/23 0055 01/03/23 0220           Data Reviewed:  I have personally reviewed the following...  CBC: Recent Labs  Lab 01/02/23 1753 01/04/23 0611 01/07/23 0405  WBC 9.2 9.8 9.6  NEUTROABS 7.3 8.0*  --   HGB 12.2 11.0* 11.5*  HCT 38.2 35.7* 37.1  MCV 92.3 94.9 95.1  PLT 219 211 269   Basic Metabolic Panel: Recent Labs  Lab 01/02/23 1753 01/04/23 0611 01/05/23 0459 01/07/23 0405  NA 131* 138 140 139  K 3.8 3.8 4.6 4.2  CL 97* 103 101 98  CO2 24 29 32 33*  GLUCOSE 224* 166* 118* 122*  BUN 29* 35* 32* 28*  CREATININE 1.36* 1.15* 1.10* 0.87  CALCIUM 8.4* 8.5* 8.7* 8.5*  MG  --   --  2.5* 2.3   GFR: Estimated Creatinine Clearance: 61.6 mL/min (by C-G formula based on SCr of 0.87 mg/dL). Liver Function Tests: Recent Labs  Lab 01/02/23 1753  AST 29  ALT 14  ALKPHOS 58  BILITOT 0.9  PROT 6.9  ALBUMIN 3.1*   No results for input(s): "LIPASE", "AMYLASE" in the last 168 hours. No results for input(s): "AMMONIA" in the last 168 hours. Coagulation Profile: Recent Labs  Lab 01/02/23 1753  INR 1.2   Cardiac Enzymes: No results for  input(s): "CKTOTAL", "CKMB", "CKMBINDEX", "TROPONINI" in the last 168 hours. BNP (last 3 results) No results for input(s): "PROBNP" in the last 8760 hours. HbA1C: No results for input(s): "HGBA1C" in the last 72 hours. CBG: Recent Labs  Lab 01/07/23 1702 01/07/23 2108 01/08/23 0757 01/08/23 0839 01/08/23 1141  GLUCAP 277* 130* 64* 108* 93   Lipid Profile: No results for input(s): "CHOL", "HDL", "LDLCALC", "TRIG", "CHOLHDL", "LDLDIRECT" in the last 72 hours. Thyroid Function Tests: No results for input(s): "TSH", "T4TOTAL", "FREET4", "T3FREE", "THYROIDAB" in the last 72 hours. Anemia Panel: No results for input(s): "VITAMINB12", "FOLATE", "FERRITIN", "TIBC", "IRON", "RETICCTPCT" in the last 72 hours. Most Recent Urinalysis On File:     Component Value Date/Time   COLORURINE AMBER (A) 01/03/2023 0053   APPEARANCEUR CLOUDY (A) 01/03/2023 0053   APPEARANCEUR Clear 07/26/2016 1445   LABSPEC 1.018 01/03/2023 0053   LABSPEC 1.012 10/10/2011 1219   PHURINE 5.0 01/03/2023 0053   GLUCOSEU NEGATIVE 01/03/2023 0053   GLUCOSEU Negative 10/10/2011 1219   HGBUR MODERATE (A) 01/03/2023 0053   BILIRUBINUR NEGATIVE 01/03/2023 0053   BILIRUBINUR Negative 08/28/2022 1537   BILIRUBINUR Negative 07/26/2016 1445   BILIRUBINUR Negative 10/10/2011 1219   KETONESUR NEGATIVE 01/03/2023 0053   PROTEINUR 100 (A) 01/03/2023 0053   UROBILINOGEN 0.2 08/28/2022 1537   NITRITE NEGATIVE 01/03/2023 0053   LEUKOCYTESUR LARGE (A) 01/03/2023 0053   LEUKOCYTESUR 2+ 10/10/2011 1219   Sepsis Labs: @LABRCNTIP (procalcitonin:4,lacticidven:4) Microbiology: Recent Results (from the past 240 hour(s))  Culture, blood (Routine x 2)     Status: None   Collection Time: 01/02/23  5:53 PM   Specimen: BLOOD  Result Value Ref Range Status   Specimen Description BLOOD BLOOD LEFT ARM  Final  Special Requests   Final    BOTTLES DRAWN AEROBIC AND ANAEROBIC Blood Culture adequate volume   Culture   Final    NO GROWTH 5  DAYS Performed at [facility], [address] [place], [place] [address]    Report Status 01/07/2023 FINAL  Final  Culture, blood (Routine x 2)     Status: None   Collection Time: 01/02/23  6:00 PM   Specimen: BLOOD  Result Value Ref Range Status   Specimen Description BLOOD BLOOD RIGHT ARM  Final   Special Requests   Final    BOTTLES DRAWN AEROBIC AND ANAEROBIC Blood Culture adequate volume   Culture   Final    NO GROWTH 5 DAYS Performed at [facility], [address] [place], [place] [address]    Report Status 01/07/2023 FINAL  Final  Resp panel by RT-PCR (RSV, Flu A&B, Covid) Anterior Nasal Swab     Status: None   Collection Time: 01/03/23 12:53 AM   Specimen: Anterior Nasal Swab  Result Value Ref Range Status   SARS Coronavirus 2 by RT PCR NEGATIVE NEGATIVE Final    Comment: (NOTE) SARS-CoV-2 target nucleic acids are NOT DETECTED.  The SARS-CoV-2 RNA is generally detectable in upper respiratory specimens during the acute phase of infection. The lowest concentration of SARS-CoV-2 viral copies this assay can detect is 138 copies/mL. A negative result does not preclude SARS-Cov-2 infection and should not be used as the sole basis for treatment or other patient management decisions. A negative result may occur with  improper specimen collection/handling, submission of specimen other than nasopharyngeal swab, presence of viral mutation(s) within the areas targeted by this assay, and inadequate number of viral copies(<138 copies/mL). A negative result must be combined with clinical observations, patient history, and epidemiological information. The expected result is Negative.  Fact Sheet for Patients:  BloggerCourse.com  Fact Sheet for Healthcare Providers:  SeriousBroker.it  This test is no t yet approved or cleared by the [place] FDA and  has been authorized for detection and/or diagnosis of  SARS-CoV-2 by FDA under an Emergency Use Authorization (EUA). This EUA will remain  in effect (meaning this test can be used) for the duration of the COVID-19 declaration under Section 564(b)(1) of the Act, 21 U.S.C.section 360bbb-3(b)(1), unless the authorization is terminated  or revoked sooner.       Influenza A by PCR NEGATIVE NEGATIVE Final   Influenza B by PCR NEGATIVE NEGATIVE Final    Comment: (NOTE) The Xpert Xpress SARS-CoV-2/FLU/RSV plus assay is intended as an aid in the diagnosis of influenza from Nasopharyngeal swab specimens and should not be used as a sole basis for treatment. Nasal washings and aspirates are unacceptable for Xpert Xpress SARS-CoV-2/FLU/RSV testing.  Fact Sheet for Patients: BloggerCourse.com  Fact Sheet for Healthcare Providers: SeriousBroker.it  This test is not yet approved or cleared by the [place] FDA and has been authorized for detection and/or diagnosis of SARS-CoV-2 by FDA under an Emergency Use Authorization (EUA). This EUA will remain in effect (meaning this test can be used) for the duration of the COVID-19 declaration under Section 564(b)(1) of the Act, 21 U.S.C. section 360bbb-3(b)(1), unless the authorization is terminated or revoked.     Resp Syncytial Virus by PCR NEGATIVE NEGATIVE Final    Comment: (NOTE) Fact Sheet for Patients: BloggerCourse.com  Fact Sheet for Healthcare Providers: SeriousBroker.it  This test is not yet approved or cleared by the [place] and has been authorized for  detection and/or diagnosis of SARS-CoV-2 by FDA under an Emergency Use Authorization (EUA). This EUA will remain in effect (meaning this test can be used) for the duration of the COVID-19 declaration under Section 564(b)(1) of the Act, 21 U.S.C. section 360bbb-3(b)(1), unless the authorization is terminated  or revoked.  Performed at [facility], [address] [place], [place] [address]   Urine Culture     Status: Abnormal   Collection Time: 01/03/23 12:53 AM   Specimen: Urine, Random  Result Value Ref Range Status   Specimen Description   Final    URINE, RANDOM Performed at [facility], [address] [place], [place] [address]    Special Requests   Final    NONE Performed at [facility], [address] [place], [place] [address]    Culture 50,000 COLONIES/mL ESCHERICHIA COLI (A)  Final   Report Status 01/05/2023 FINAL  Final   Organism ID, Bacteria ESCHERICHIA COLI (A)  Final      Susceptibility   Escherichia coli - MIC*    AMPICILLIN <=2 SENSITIVE Sensitive     CEFAZOLIN <=4 SENSITIVE Sensitive     CEFEPIME <=0.12 SENSITIVE Sensitive     CEFTRIAXONE <=0.25 SENSITIVE Sensitive     CIPROFLOXACIN <=0.25 SENSITIVE Sensitive     GENTAMICIN <=1 SENSITIVE Sensitive     IMIPENEM <=0.25 SENSITIVE Sensitive     NITROFURANTOIN <=16 SENSITIVE Sensitive     TRIMETH/SULFA <=20 SENSITIVE Sensitive     AMPICILLIN/SULBACTAM <=2 SENSITIVE Sensitive     PIP/TAZO <=4 SENSITIVE Sensitive ug/mL    * 50,000 COLONIES/mL ESCHERICHIA COLI  Respiratory (~20 pathogens) panel by PCR     Status: None   Collection Time: 01/03/23 10:40 AM   Specimen: Nasopharyngeal Swab; Respiratory  Result Value Ref Range Status   Adenovirus NOT DETECTED NOT DETECTED Final   Coronavirus 229E NOT DETECTED NOT DETECTED Final    Comment: (NOTE) The Coronavirus on the Respiratory Panel, DOES NOT test for the novel  Coronavirus (2019 nCoV)    Coronavirus HKU1 NOT DETECTED NOT DETECTED Final   Coronavirus NL63 NOT DETECTED NOT DETECTED Final   Coronavirus OC43 NOT DETECTED NOT DETECTED Final   Metapneumovirus NOT DETECTED NOT DETECTED Final   Rhinovirus / Enterovirus NOT DETECTED NOT DETECTED Final   Influenza A NOT DETECTED NOT DETECTED Final   Influenza B NOT DETECTED NOT DETECTED  Final   Parainfluenza Virus 1 NOT DETECTED NOT DETECTED Final   Parainfluenza Virus 2 NOT DETECTED NOT DETECTED Final   Parainfluenza Virus 3 NOT DETECTED NOT DETECTED Final   Parainfluenza Virus 4 NOT DETECTED NOT DETECTED Final   Respiratory Syncytial Virus NOT DETECTED NOT DETECTED Final   Bordetella pertussis NOT DETECTED NOT DETECTED Final   Bordetella Parapertussis NOT DETECTED NOT DETECTED Final   Chlamydophila pneumoniae NOT DETECTED NOT DETECTED Final   Mycoplasma pneumoniae NOT DETECTED NOT DETECTED Final    Comment: Performed at [facility], [address], [place], [place] [address]      Radiology Studies last 3 days: MR LUMBAR SPINE WO CONTRAST  Result Date: 01/07/2023 CLINICAL DATA:  Provided history: Myelopathy, acute, lumbar spine. Low back pain, spondyloarthropathy suspected, x-ray done. EXAM: MRI LUMBAR SPINE WITHOUT CONTRAST TECHNIQUE: Multiplanar, multisequence MR imaging of the lumbar spine was performed. No intravenous contrast was administered. COMPARISON:  Lumbar spine radiographs 01/06/2023. Lumbar spine MRI 08/19/2006. FINDINGS: Segmentation: 5 lumbar vertebrae. The caudal most well-formed intervertebral disc space is designated L5-S1. Alignment: 4 mm L2-L3  grade 1 retrolisthesis. Slight L3-L4 grade 1 retrolisthesis. Slight grade 1 anterolisthesis at L5-S1. Vertebrae: No lumbar vertebral compression fracture. Multilevel degenerative endplate irregularity. Mild degenerative plate edema at V5-I4 and L4-L5. Conus medullaris and cauda equina: Conus extends to the L2 level. No signal abnormality identified within the visualized distal spinal cord. Paraspinal and other soft tissues: 1.7 cm left adrenal nodule, unchanged from the prior lumbar spine MRI of 08/19/2006 and benign. No follow-up imaging recommended. No acute finding within included portions of the abdomen/retroperitoneum. Atrophy of the lumbar paraspinal musculature Disc levels: Unless otherwise stated, the  level by level findings below have not significantly changed from the prior MRI of 08/19/2006. Progressive multilevel disc degeneration. Disc degeneration is greatest at L1-L2 (moderate to advanced), L2-L3 (moderate to advanced) and L3-L4 (moderate). T11-T12: This level is imaged in the sagittal plane only. Disc bulge. Facet arthrosis and ligamentum flavum hypertrophy. Mild spinal canal narrowing. Mild right neural foraminal narrowing. T12-L1: Disc bulge. Facet arthrosis. The disc bulge mildly effaces the ventral thecal sac. No significant foraminal stenosis. L1-L2: A disc bulge mildly effaces the ventral thecal sac. No significant foraminal stenosis. L2-L3: 4 mm grade 1 retrolisthesis, new from the prior MRI. Disc bulge. Facet and ligamentum flavum hypertrophy, progressed. Mild spinal canal narrowing, progressed. Mild relative inferior neural foraminal narrowing, new on the right. L3-L4: Slight grade 1 retrolisthesis, new. Disc bulge. Advanced facet arthrosis with ligamentum flavum hypertrophy, progressed. Progressive moderate-to-severe central canal stenosis. Mild bilateral neural foraminal narrowing. L4-L5: Interval posterior decompression surgery. Progressive disc bulge. New large superimposed left subarticular disc extrusion with caudal migration to the lower L5 vertebral body level (for instance as seen on series 9, image 9). Advanced facet arthropathy. The disc extrusion contributes to severe left subarticular/lateral recess stenosis, encroaching upon descending left-sided nerve roots (most notably the descending left L5 nerve root). Moderate to moderately severe central canal stenosis, progressed. Moderate bilateral neural foraminal narrowing (greater on the left), progressed. L5-S1: Interval posterior decompression surgery. Slight grade 1 anterolisthesis. Disc bulge. Advanced facet arthrosis, progressed. Right subarticular stenosis with potential to affect the descending right S1 nerve root. Moderate  central canal stenosis, slightly progressed. Mild relative bilateral neural foraminal narrowing, progressed. IMPRESSION: 1. Comparison is made to the prior lumbar spine MRI of 08/19/2006. 2. Spondylosis at the lumbar and visualized lower thoracic levels as outlined within the body of the report. 3. At L4-L5, there has been interval posterior decompression surgery. Progressive disc bulge. New large superimposed left subarticular disc extrusion with caudal migration to the lower L5 vertebral body level. Advanced facet arthropathy. The disc extrusion contributes to severe left subarticular/lateral recess stenosis, encroaching upon descending left-sided nerve roots (most notably the descending left L5 nerve root). Moderate to moderately severe central canal stenosis, progressed. Moderate bilateral neural foraminal narrowing (greater on the left), progressed. 4. At L3-L4, there is progressive multifactorial moderate-to-severe central canal stenosis. Mild bilateral neural foraminal narrowing. 5. At L5-S1, there is multifactorial right subarticular stenosis with potential to affect the descending right S1 nerve root. Moderate central canal stenosis, progressed. Mild bilateral neural foraminal narrowing, progressed. 6. No more than mild spinal canal or neural foraminal narrowing at the remaining levels. 7. Disc degeneration is greatest at L1-L2 (moderate-to-advanced), L2-L3 (moderate) and L3-L4 (moderate). 8. Mild degenerative endplate edema at P3-I9 and L4-L5. 9. Grade 1 spondylolisthesis at L2-L3, L3-L4 and L5-S1. Electronically Signed   By: Jackey Loge D.O.   On: 01/07/2023 10:47   DG Lumbar Spine 2-3 Views  Result Date: 01/06/2023 CLINICAL DATA:  16109 Low back pain [id number] 8821 Sciatica 8821 EXAM: LUMBAR SPINE - 2-3 VIEW COMPARISON:  CT lumbar spine 12/02/2015 FINDINGS: Limited evaluation due to overlapping osseous structures and overlying soft tissues. Multilevel severe degenerative changes of the spine.  Radiographic findings suggestive of multilevel severe osseous neural foraminal stenosis. There is no evidence of lumbar spine fracture. Alignment is normal. Multilevel intervertebral disc space vacuum phenomenon. Atherosclerotic plaque. Stool throughout the colon. IMPRESSION: 1. No acute displaced fracture or traumatic listhesis of the lumbar spine. 2. Multilevel severe degenerative changes of the spine. Radiographic findings suggestive of multilevel severe osseous neural foraminal stenosis. Limited evaluation due to overlapping osseous structures and overlying soft tissues. 3. Stool throughout the colon-correlate for constipation. 4.  Aortic Atherosclerosis (ICD10-I70.0). Electronically Signed   By: Tish Frederickson M.D.   On: 01/06/2023 19:47   US ARTERIAL ABI (SCREENING LOWER EXTREMITY)  Result Date: 01/06/2023 CLINICAL DATA:  Left foot pain Former smoker Hypertension Hyperlipidemia EXAM: NONINVASIVE PHYSIOLOGIC VASCULAR STUDY OF BILATERAL LOWER EXTREMITIES TECHNIQUE: Evaluation of both lower extremities were performed at rest, including calculation of ankle-brachial indices with single level pressure measurements and doppler recording. COMPARISON:  None available. FINDINGS: Right ABI:  0.85 Left ABI:  1.00 Right Lower Extremity:  Normal arterial waveforms at the ankle. Left Lower Extremity:  Normal arterial waveforms at the ankle. 0.8-0.89 Mild PAD IMPRESSION: 1. Mild right lower extremity peripheral arterial disease. 2. No significant abnormality of the left lower extremity. Electronically Signed   By: Acquanetta Belling M.D.   On: 01/06/2023 06:47         Sunnie Nielsen, DO [facility] 01/08/2023, 4:07 PM    Dictation software may have been used to generate the above note. Typos may occur and escape review in typed/dictated notes. Please contact Dr Lyn Hollingshead directly for clarity if needed.  Staff may message me via secure chat in Epic  but this may not receive an immediate response,   please page me for urgent matters!  If 7PM-7AM, please contact night coverage [email or web address]

## 2023-01-08 NOTE — Plan of Care (Signed)
Patient continues on 2L [place]. Insulin dc'ed today d/t hypoglycemia. Planning for dc to [facility] for rehab.  Problem: Education: Goal: Ability to describe self-care measures that may prevent or decrease complications (Diabetes Survival Skills Education) will improve Outcome: Progressing Goal: Individualized Educational Video(s) Outcome: Progressing   Problem: Coping: Goal: Ability to adjust to condition or change in health will improve Outcome: Progressing   Problem: Fluid Volume: Goal: Ability to maintain a balanced intake and output will improve Outcome: Progressing   Problem: Health Behavior/Discharge Planning: Goal: Ability to identify and utilize available resources and services will improve Outcome: Progressing Goal: Ability to manage health-related needs will improve Outcome: Progressing   Problem: Metabolic: Goal: Ability to maintain appropriate glucose levels will improve Outcome: Progressing   Problem: Nutritional: Goal: Maintenance of adequate nutrition will improve Outcome: Progressing Goal: Progress toward achieving an optimal weight will improve Outcome: Progressing   Problem: Skin Integrity: Goal: Risk for impaired skin integrity will decrease Outcome: Progressing   Problem: Tissue Perfusion: Goal: Adequacy of tissue perfusion will improve Outcome: Progressing   Problem: Education: Goal: Knowledge of General Education information will improve Description: Including pain rating scale, medication(s)/side effects and non-pharmacologic comfort measures Outcome: Progressing   Problem: Health Behavior/Discharge Planning: Goal: Ability to manage health-related needs will improve Outcome: Progressing   Problem: Clinical Measurements: Goal: Ability to maintain clinical measurements within normal limits will improve Outcome: Progressing Goal: Will remain free from infection Outcome: Progressing Goal: Diagnostic test results will improve Outcome:  Progressing Goal: Respiratory complications will improve Outcome: Progressing Goal: Cardiovascular complication will be avoided Outcome: Progressing   Problem: Activity: Goal: Risk for activity intolerance will decrease Outcome: Progressing   Problem: Nutrition: Goal: Adequate nutrition will be maintained Outcome: Progressing   Problem: Coping: Goal: Level of anxiety will decrease Outcome: Progressing   Problem: Elimination: Goal: Will not experience complications related to bowel motility Outcome: Progressing Goal: Will not experience complications related to urinary retention Outcome: Progressing   Problem: Pain Management: Goal: General experience of comfort will improve Outcome: Progressing   Problem: Safety: Goal: Ability to remain free from injury will improve Outcome: Progressing   Problem: Skin Integrity: Goal: Risk for impaired skin integrity will decrease Outcome: Progressing   Problem: Education: Goal: Knowledge of disease or condition will improve Outcome: Progressing Goal: Knowledge of the prescribed therapeutic regimen will improve Outcome: Progressing Goal: Individualized Educational Video(s) Outcome: Progressing   Problem: Activity: Goal: Ability to tolerate increased activity will improve Outcome: Progressing Goal: Will verbalize the importance of balancing activity with adequate rest periods Outcome: Progressing   Problem: Respiratory: Goal: Ability to maintain a clear airway will improve Outcome: Progressing Goal: Levels of oxygenation will improve Outcome: Progressing Goal: Ability to maintain adequate ventilation will improve Outcome: Progressing   Problem: Activity: Goal: Ability to tolerate increased activity will improve Outcome: Progressing   Problem: Clinical Measurements: Goal: Ability to maintain a body temperature in the normal range will improve Outcome: Progressing   Problem: Respiratory: Goal: Ability to maintain  adequate ventilation will improve Outcome: Progressing Goal: Ability to maintain a clear airway will improve Outcome: Progressing

## 2023-01-09 DIAGNOSIS — J9601 Acute respiratory failure with hypoxia: Secondary | ICD-10-CM | POA: Diagnosis not present

## 2023-01-09 LAB — CBC
HCT: 40.8 % (ref 36.0–46.0)
Hemoglobin: 12.8 g/dL (ref 12.0–15.0)
MCH: 29.6 pg (ref 26.0–34.0)
MCHC: 31.4 g/dL (ref 30.0–36.0)
MCV: 94.4 fL (ref 80.0–100.0)
Platelets: 330 10*3/uL (ref 150–400)
RBC: 4.32 MIL/uL (ref 3.87–5.11)
RDW: 13.2 % (ref 11.5–15.5)
WBC: 9 10*3/uL (ref 4.0–10.5)
nRBC: 0 % (ref 0.0–0.2)

## 2023-01-09 LAB — BASIC METABOLIC PANEL
Anion gap: 9 (ref 5–15)
BUN: 32 mg/dL — ABNORMAL HIGH (ref 8–23)
CO2: 36 mmol/L — ABNORMAL HIGH (ref 22–32)
Calcium: 8.7 mg/dL — ABNORMAL LOW (ref 8.9–10.3)
Chloride: 96 mmol/L — ABNORMAL LOW (ref 98–111)
Creatinine, Ser: 1.25 mg/dL — ABNORMAL HIGH (ref 0.44–1.00)
GFR, Estimated: 46 mL/min — ABNORMAL LOW (ref >60)
Glucose, Bld: 96 mg/dL (ref 70–99)
Potassium: 4.3 mmol/L (ref 3.5–5.1)
Sodium: 141 mmol/L (ref 135–145)

## 2023-01-09 LAB — MAGNESIUM: Magnesium: 2.3 mg/dL (ref 1.7–2.4)

## 2023-01-09 LAB — GLUCOSE, CAPILLARY
Glucose-Capillary: 83 mg/dL (ref 70–99)
Glucose-Capillary: 127 mg/dL — ABNORMAL HIGH (ref 70–99)

## 2023-01-09 MED ORDER — HYDROCODONE-ACETAMINOPHEN 5-325 MG PO TABS: 1.0000 | ORAL_TABLET | Freq: Four times a day (QID) | ORAL | 0 refills | Status: DC | PRN

## 2023-01-09 MED ORDER — CLONAZEPAM 1 MG PO TABS: ORAL_TABLET | ORAL | 0 refills | Status: DC

## 2023-01-09 NOTE — Progress Notes (Signed)
Attempted to call [facility], unable to reach staff to give report

## 2023-01-09 NOTE — Plan of Care (Signed)
  Problem: Education: Goal: Ability to describe self-care measures that may prevent or decrease complications (Diabetes Survival Skills Education) will improve Outcome: Progressing Goal: Individualized Educational Video(s) Outcome: Progressing   Problem: Coping: Goal: Ability to adjust to condition or change in health will improve Outcome: Progressing   Problem: Fluid Volume: Goal: Ability to maintain a balanced intake and output will improve Outcome: Progressing   Problem: Health Behavior/Discharge Planning: Goal: Ability to identify and utilize available resources and services will improve Outcome: Progressing Goal: Ability to manage health-related needs will improve Outcome: Progressing   Problem: Metabolic: Goal: Ability to maintain appropriate glucose levels will improve Outcome: Progressing   Problem: Nutritional: Goal: Maintenance of adequate nutrition will improve Outcome: Progressing Goal: Progress toward achieving an optimal weight will improve Outcome: Progressing   Problem: Skin Integrity: Goal: Risk for impaired skin integrity will decrease Outcome: Progressing   Problem: Health Behavior/Discharge Planning: Goal: Ability to manage health-related needs will improve Outcome: Progressing   Problem: Clinical Measurements: Goal: Ability to maintain clinical measurements within normal limits will improve Outcome: Progressing Goal: Will remain free from infection Outcome: Progressing Goal: Diagnostic test results will improve Outcome: Progressing Goal: Respiratory complications will improve Outcome: Progressing Goal: Cardiovascular complication will be avoided Outcome: Progressing   Problem: Activity: Goal: Risk for activity intolerance will decrease Outcome: Progressing   Problem: Nutrition: Goal: Adequate nutrition will be maintained Outcome: Progressing   Problem: Coping: Goal: Level of anxiety will decrease Outcome: Progressing   Problem: Pain  Management: Goal: General experience of comfort will improve Outcome: Progressing   Problem: Safety: Goal: Ability to remain free from injury will improve Outcome: Progressing   Problem: Skin Integrity: Goal: Risk for impaired skin integrity will decrease Outcome: Progressing   Problem: Activity: Goal: Ability to tolerate increased activity will improve Outcome: Progressing Goal: Will verbalize the importance of balancing activity with adequate rest periods Outcome: Progressing   Problem: Respiratory: Goal: Ability to maintain a clear airway will improve Outcome: Progressing Goal: Levels of oxygenation will improve Outcome: Progressing Goal: Ability to maintain adequate ventilation will improve Outcome: Progressing   Problem: Clinical Measurements: Goal: Ability to maintain a body temperature in the normal range will improve Outcome: Progressing

## 2023-01-09 NOTE — Discharge Summary (Signed)
[facility] Discharge Summary   Patient: [name] ION:[id number]  PCP: Lorre Munroe, NP  Date of admission: 01/02/2023   Date of discharge:  01/09/2023     Discharge Diagnoses:  Principal Problem:   Acute respiratory failure with hypoxia (HCC) Active Problems:   CAP (community acquired pneumonia)   COPD with acute exacerbation (HCC)   UTI (urinary tract infection)   AKI (acute kidney injury) (HCC)   Anxiety and depression   DM (diabetes mellitus), type 2 (HCC)   Benign essential HTN   Hypothyroidism   Admitted From: Home Disposition:  SNF   Recommendations for Outpatient Follow-up:  Follow-up with PCP, patient should be seen by an MD in 1 to 2 days, monitor BP and titrate medications accordingly. Please check orthostatics at the SNF facility as patient is having frequent falls Follow-up with neurosurgery as an outpatient for lumbar spine stenosis. Follow up LABS/TEST: CXR after 4 weeks for resolution of pneumonia   Contact information for after-discharge care     Destination     [facility] Preferred SNF .   Service: Skilled Nursing Contact information: [id number] [place] [address] [phone]                    Diet recommendation: Cardiac and Carb modified diet  Activity: The patient is advised to gradually reintroduce usual activities, as tolerated  Discharge Condition: stable  Code Status: Full code   History of present illness: As per the H and P dictated on admission Hospital Course:  [name] is a 72 y.o. female with medical history significant of COPD, hypertension, PAD with intermittent claudication, type 2 diabetes, hypothyroidism, who presented to the ED due to nausea and vomiting. Assoc w/ fever, abdominal pain and pain with urination. She notes that her abdominal symptoms started first, but then afterwards she began to experience a  nonproductive cough with worsening shortness of breath.   Hospital course / significant events:  11/28: admitted to hospitalist service for CAP, COPD, AKI 11/29-11/30: improving, still needign O2 12/01: PT/OT recs for SNF. O2 requirement about the same 12/02-12/03: breathing stable/improved. SNF placement pending.   ASSESSMENT & PLAN:   # Acute respiratory failure with hypoxia. Resolved  Likely multifactorial in the setting of community-acquired pneumonia and COPD exacerbation Management of CAP and COPD as noted below supplemental O2 weaned off and currently saturating well on room air.  # Strep pneumo PNA: Chest x-ray notable for RLL opacity in the setting of pulmonary symptoms consistent with CAP.  Strep pneumo antigen pos. S/p ceftriaxone for 5 days, completed course.  Repeat x-ray chest after 4 weeks for resolution of pneumonia # COPD with acute exacerbation: S/p Solu-Medrol 125 mg once and prednisone 40 mg daily. S/p DuoNebs  and Continue home bronchodilators # UTI (urinary tract infection) 2/2 E coli: Patient endorsing dysuria and suprapubic abdominal pain.  Urinalysis consistent with UTI. Completed Ceftriaxone  # LLE numbness associated w/ low back pain - chronic  Lumbar disc disease XR lumbar spine abn - MRI confirms neural compression worst at L4/L5 Discussed w/ Dr Katrinka Blazing neurosurgery - he will have his office set up outpatinet follow up  PT/OT as tolerated at SNF # AKI (acute kidney injury) resolved  In the setting of poor p.o. intake, nausea with vomiting and underlying illness. Continue oral hydration # Hypothyroidism: Continue home Synthroid # Benign essential HTN: BP intermittently soft but improving.  Home medications were held temporarily, and then resumed when blood pressure was high.  Continue to monitor BP and titrate medications accordingly. # DM (diabetes mellitus), type 2 well controlled: No home meds, d/c SSI here. Follow outpatient # Anxiety and depression: Continue  home citalopram, Klonopin, doxepin # obesity class II BMI slightly above 35  Body mass index is 35.42 kg/m.  Nutrition Interventions: Calorie restricted diet and daily exercise advised to lose body weight.  Lifestyle modification discussed.   Pain control  - [employer] Controlled Substance Reporting System database could not be reviewed, because website was not working -Prescription given Norco 10 tablets and Klonopin 10 tablets for dc to SNF. - Patient was instructed, not to drive, operate heavy machinery, perform activities at heights, swimming or participation in water activities or provide baby sitting services while on Pain, Sleep and Anxiety Medications; until her outpatient Physician has advised to do so again.  - Also recommended to not to take more than prescribed Pain, Sleep and Anxiety Medications.  Patient was seen by physical therapy, who recommended Therapy, SNF placement, which was arranged. On the day of the discharge the patient's vitals were stable, and no other acute medical condition were reported by patient. the patient was felt safe to be discharge at SNF with Therapy.  Consultants: d/w neurosurgery, need to follow as an outpatient Procedures: None  Discharge Exam: General: Appear in no distress, no Rash; Oral Mucosa Clear, moist. Cardiovascular: S1 and S2 Present, no Murmur, Respiratory: normal respiratory effort, Bilateral Air entry present and no Crackles, no wheezes Abdomen: Bowel Sound present, Soft and no tenderness, no hernia Extremities: no Pedal edema, no calf tenderness Neurology: alert and oriented to time, place, and person.  Left foot numbness affect appropriate.  Filed Weights   01/02/23 1750  Weight: 90.7 kg   Vitals:   01/09/23 0427 01/09/23 0806  BP: (!) 150/66 (!) 145/68  Pulse: 63 63  Resp: 19 18  Temp: 98.2 F (36.8 C) 98.6 F (37 C)  SpO2: 93% 95%    DISCHARGE MEDICATION: Allergies as of 01/09/2023       Reactions    Ciprofloxacin Hives, Diarrhea   Mucinex [guaifenesin Er] Nausea Only   Per pt   Oxycodone Diarrhea   Oxycodone-acetaminophen Diarrhea   Oxycodone-acetaminophen Diarrhea   Sulfa Antibiotics Rash   Throat, mouth Throat, mouth        Medication List     STOP taking these medications    clobetasol cream 0.05 % Commonly known as: TEMOVATE       TAKE these medications    acetaminophen 500 MG tablet Commonly known as: TYLENOL Take 500 mg by mouth every 6 (six) hours as needed for moderate pain.   albuterol 108 (90 Base) MCG/ACT inhaler Commonly known as: VENTOLIN HFA INHALE 2 PUFFS BY MOUTH EVERY 6 HOURS AS NEEDED FOR WHEEZING OR SHORTNESS OF BREATH   Aspirin Low Dose 81 MG tablet Generic drug: aspirin EC TAKE 1 TABLET(81 MG) BY MOUTH DAILY. SWALLOW WHOLE   carvedilol 3.125 MG tablet Commonly known as: COREG TAKE 1 TABLET BY MOUTH TWICE DAILY WITH A MEAL   citalopram 40 MG tablet Commonly known as: CELEXA TAKE 1 TABLET(40 MG) BY MOUTH DAILY   clonazePAM 1 MG tablet Commonly known as: KLONOPIN TAKE 1 TABLET BY MOUTH EVERY MORNING, 1/2 TABLET EVERY AFTERNOON AND 1 TABLET EVERY NIGHT AT BEDTIME   diltiazem 120 MG 24 hr capsule Commonly known as: CARDIZEM CD TAKE 1 CAPSULE BY MOUTH EVERY  DAY   doxepin 25 MG capsule Commonly known as: SINEQUAN Take 50 mg by mouth at bedtime.   fenofibrate 145 MG tablet Commonly known as: TRICOR TAKE 1 TABLET(145 MG) BY MOUTH DAILY   fluticasone furoate-vilanterol 100-25 MCG/ACT Aepb Commonly known as: BREO ELLIPTA Inhale 1 puff into the lungs daily.   HYDROcodone-acetaminophen 5-325 MG tablet Commonly known as: NORCO/VICODIN Take 1 tablet by mouth every 6 (six) hours as needed for severe pain (pain score 7-10).   hydrOXYzine 25 MG tablet Commonly known as: ATARAX Take 25 mg by mouth at bedtime.   ipratropium-albuterol 0.5-2.5 (3) MG/3ML Soln Commonly known as: DUONEB Take 3 mLs by nebulization every 6 (six) hours as  needed (wheezing / shortness of breath).   levothyroxine 100 MCG tablet Commonly known as: SYNTHROID Take 1 tablet (100 mcg total) by mouth daily.   lisinopril 10 MG tablet Commonly known as: ZESTRIL TAKE 1 TABLET(10 MG) BY MOUTH DAILY   pantoprazole 40 MG tablet Commonly known as: PROTONIX SMARTSIG:1 Tablet(s) By Mouth Every Evening   Quad Cane/Small Base Misc 1 Device by Does not apply route daily.   rosuvastatin 20 MG tablet Commonly known as: Crestor Take 1 tablet (20 mg total) by mouth daily.   Vitamin D (Ergocalciferol) 1.25 MG (50000 UNIT) Caps capsule Commonly known as: DRISDOL Take 50,000 Units by mouth 2 (two) times a week.       Allergies  Allergen Reactions   Ciprofloxacin Hives and Diarrhea   Mucinex [Guaifenesin Er] Nausea Only    Per pt   Oxycodone Diarrhea   Oxycodone-Acetaminophen Diarrhea   Oxycodone-Acetaminophen Diarrhea   Sulfa Antibiotics Rash    Throat, mouth Throat, mouth   Discharge Instructions     Call MD for:   Complete by: As directed    Frequent falls, headache or dizziness, new neurological changes.   Call MD for:  difficulty breathing, headache or visual disturbances   Complete by: As directed    Call MD for:  extreme fatigue   Complete by: As directed    Call MD for:  persistant dizziness or light-headedness   Complete by: As directed    Call MD for:  persistant nausea and vomiting   Complete by: As directed    Call MD for:  severe uncontrolled pain   Complete by: As directed    Call MD for:  temperature >100.4   Complete by: As directed    Diet - low sodium heart healthy   Complete by: As directed    Discharge instructions   Complete by: As directed    Follow-up with PCP, patient should be seen by an MD in 1 to 2 days, monitor BP and titrate medications accordingly. Please check orthostatics at the SNF facility as patient is having frequent falls Follow-up with neurosurgery as an outpatient for lumbar spine stenosis.    Increase activity slowly   Complete by: As directed        The results of significant diagnostics from this hospitalization (including imaging, microbiology, ancillary and laboratory) are listed below for reference.    Significant Diagnostic Studies: MR LUMBAR SPINE WO CONTRAST  Result Date: 01/07/2023 CLINICAL DATA:  Provided history: Myelopathy, acute, lumbar spine. Low back pain, spondyloarthropathy suspected, x-ray done. EXAM: MRI LUMBAR SPINE WITHOUT CONTRAST TECHNIQUE: Multiplanar, multisequence MR imaging of the lumbar spine was performed. No intravenous contrast was administered. COMPARISON:  Lumbar spine radiographs 01/06/2023. Lumbar spine MRI 08/19/2006. FINDINGS: Segmentation: 5 lumbar vertebrae. The caudal most well-formed intervertebral disc space  is designated L5-S1. Alignment: 4 mm L2-L3 grade 1 retrolisthesis. Slight L3-L4 grade 1 retrolisthesis. Slight grade 1 anterolisthesis at L5-S1. Vertebrae: No lumbar vertebral compression fracture. Multilevel degenerative endplate irregularity. Mild degenerative plate edema at Z6-X0 and L4-L5. Conus medullaris and cauda equina: Conus extends to the L2 level. No signal abnormality identified within the visualized distal spinal cord. Paraspinal and other soft tissues: 1.7 cm left adrenal nodule, unchanged from the prior lumbar spine MRI of 08/19/2006 and benign. No follow-up imaging recommended. No acute finding within included portions of the abdomen/retroperitoneum. Atrophy of the lumbar paraspinal musculature Disc levels: Unless otherwise stated, the level by level findings below have not significantly changed from the prior MRI of 08/19/2006. Progressive multilevel disc degeneration. Disc degeneration is greatest at L1-L2 (moderate to advanced), L2-L3 (moderate to advanced) and L3-L4 (moderate). T11-T12: This level is imaged in the sagittal plane only. Disc bulge. Facet arthrosis and ligamentum flavum hypertrophy. Mild spinal canal narrowing.  Mild right neural foraminal narrowing. T12-L1: Disc bulge. Facet arthrosis. The disc bulge mildly effaces the ventral thecal sac. No significant foraminal stenosis. L1-L2: A disc bulge mildly effaces the ventral thecal sac. No significant foraminal stenosis. L2-L3: 4 mm grade 1 retrolisthesis, new from the prior MRI. Disc bulge. Facet and ligamentum flavum hypertrophy, progressed. Mild spinal canal narrowing, progressed. Mild relative inferior neural foraminal narrowing, new on the right. L3-L4: Slight grade 1 retrolisthesis, new. Disc bulge. Advanced facet arthrosis with ligamentum flavum hypertrophy, progressed. Progressive moderate-to-severe central canal stenosis. Mild bilateral neural foraminal narrowing. L4-L5: Interval posterior decompression surgery. Progressive disc bulge. New large superimposed left subarticular disc extrusion with caudal migration to the lower L5 vertebral body level (for instance as seen on series 9, image 9). Advanced facet arthropathy. The disc extrusion contributes to severe left subarticular/lateral recess stenosis, encroaching upon descending left-sided nerve roots (most notably the descending left L5 nerve root). Moderate to moderately severe central canal stenosis, progressed. Moderate bilateral neural foraminal narrowing (greater on the left), progressed. L5-S1: Interval posterior decompression surgery. Slight grade 1 anterolisthesis. Disc bulge. Advanced facet arthrosis, progressed. Right subarticular stenosis with potential to affect the descending right S1 nerve root. Moderate central canal stenosis, slightly progressed. Mild relative bilateral neural foraminal narrowing, progressed. IMPRESSION: 1. Comparison is made to the prior lumbar spine MRI of 08/19/2006. 2. Spondylosis at the lumbar and visualized lower thoracic levels as outlined within the body of the report. 3. At L4-L5, there has been interval posterior decompression surgery. Progressive disc bulge. New large  superimposed left subarticular disc extrusion with caudal migration to the lower L5 vertebral body level. Advanced facet arthropathy. The disc extrusion contributes to severe left subarticular/lateral recess stenosis, encroaching upon descending left-sided nerve roots (most notably the descending left L5 nerve root). Moderate to moderately severe central canal stenosis, progressed. Moderate bilateral neural foraminal narrowing (greater on the left), progressed. 4. At L3-L4, there is progressive multifactorial moderate-to-severe central canal stenosis. Mild bilateral neural foraminal narrowing. 5. At L5-S1, there is multifactorial right subarticular stenosis with potential to affect the descending right S1 nerve root. Moderate central canal stenosis, progressed. Mild bilateral neural foraminal narrowing, progressed. 6. No more than mild spinal canal or neural foraminal narrowing at the remaining levels. 7. Disc degeneration is greatest at L1-L2 (moderate-to-advanced), L2-L3 (moderate) and L3-L4 (moderate). 8. Mild degenerative endplate edema at R6-E4 and L4-L5. 9. Grade 1 spondylolisthesis at L2-L3, L3-L4 and L5-S1. Electronically Signed   By: Jackey Loge D.O.   On: 01/07/2023 10:47   DG Lumbar Spine 2-3  Views  Result Date: 01/06/2023 CLINICAL DATA:  [address] Low back pain [address] 8821 Sciatica 8821 EXAM: LUMBAR SPINE - 2-3 VIEW COMPARISON:  CT lumbar spine 12/02/2015 FINDINGS: Limited evaluation due to overlapping osseous structures and overlying soft tissues. Multilevel severe degenerative changes of the spine. Radiographic findings suggestive of multilevel severe osseous neural foraminal stenosis. There is no evidence of lumbar spine fracture. Alignment is normal. Multilevel intervertebral disc space vacuum phenomenon. Atherosclerotic plaque. Stool throughout the colon. IMPRESSION: 1. No acute displaced fracture or traumatic listhesis of the lumbar spine. 2. Multilevel severe degenerative changes of the spine.  Radiographic findings suggestive of multilevel severe osseous neural foraminal stenosis. Limited evaluation due to overlapping osseous structures and overlying soft tissues. 3. Stool throughout the colon-correlate for constipation. 4.  Aortic Atherosclerosis (ICD10-I70.0). Electronically Signed   By: Tish Frederickson M.D.   On: 01/06/2023 19:47   US ARTERIAL ABI (SCREENING LOWER EXTREMITY)  Result Date: 01/06/2023 CLINICAL DATA:  Left foot pain Former smoker Hypertension Hyperlipidemia EXAM: NONINVASIVE PHYSIOLOGIC VASCULAR STUDY OF BILATERAL LOWER EXTREMITIES TECHNIQUE: Evaluation of both lower extremities were performed at rest, including calculation of ankle-brachial indices with single level pressure measurements and doppler recording. COMPARISON:  None available. FINDINGS: Right ABI:  0.85 Left ABI:  1.00 Right Lower Extremity:  Normal arterial waveforms at the ankle. Left Lower Extremity:  Normal arterial waveforms at the ankle. 0.8-0.89 Mild PAD IMPRESSION: 1. Mild right lower extremity peripheral arterial disease. 2. No significant abnormality of the left lower extremity. Electronically Signed   By: Acquanetta Belling M.D.   On: 01/06/2023 06:47   DG Chest 2 View  Result Date: 01/02/2023 CLINICAL DATA:  Cough and fever for 2 days.  Sepsis. EXAM: CHEST - 2 VIEW COMPARISON:  12/19/2022 FINDINGS: The heart size and mediastinal contours are within normal limits. Mild infiltrate or atelectasis is seen in the posterior right lower lobe. No pleural effusion. IMPRESSION: Mild right lower lobe infiltrate or atelectasis. Electronically Signed   By: Danae Orleans M.D.   On: 01/02/2023 18:48   DG Chest 2 View  Result Date: 12/19/2022 CLINICAL DATA:  Shortness of breath EXAM: CHEST - 2 VIEW COMPARISON:  Chest x-ray 04/12/2022 FINDINGS: The heart size and mediastinal contours are within normal limits. Both lungs are clear. The visualized skeletal structures are unremarkable. IMPRESSION: No active cardiopulmonary  disease. Electronically Signed   By: Darliss Cheney M.D.   On: 12/19/2022 21:05   MR HIP LEFT WO CONTRAST  Result Date: 12/19/2022 CLINICAL DATA:  Left hip pain after mechanical fall EXAM: MR OF THE LEFT HIP WITHOUT CONTRAST TECHNIQUE: Multiplanar, multisequence MR imaging was performed. No intravenous contrast was administered. COMPARISON:  X-ray 12/19/2022 FINDINGS: Bones: No acute fracture. No dislocation. No femoral head avascular necrosis. Bony pelvis intact without diastasis. SI joints and pubic symphysis within normal limits. Bone marrow edema centered at the L4-L5 disc is presumably discogenic. Elsewhere, no bone marrow edema. No marrow replacing bone lesion. Articular cartilage and labrum Articular cartilage: Mild diffuse chondral thinning and surface irregularity. No subchondral marrow signal changes. Labrum:  Grossly intact.  No paralabral cyst. Joint or bursal effusion Joint effusion:  None. Bursae: No abnormal bursal fluid collection. Muscles and tendons Muscles and tendons: The gluteal, hamstring, iliopsoas, rectus femoris, and adductor tendons appear intact without tear or significant tendinosis. Normal muscle bulk and signal intensity without edema, atrophy, or fatty infiltration. Other findings Miscellaneous: No soft tissue edema or fluid collection. No inguinal lymphadenopathy. Simple left adnexal cyst measuring up to 4.8  cm in size. Sigmoid diverticulosis. IMPRESSION: 1. No acute osseous abnormality of the left hip. 2. Mild degenerative changes of the left hip. 3. Endplate bone marrow edema centered at the L4-L5 disc is presumably discogenic. 4. Simple left adnexal cyst measuring up to 4.8 cm in size. No follow up imaging recommended. Note: This recommendation does not apply to premenarchal patients and to those with increased risk (genetic, family history, elevated tumor markers or other high-risk factors) of ovarian cancer. Reference: JACR 2020 Feb; 17(2):248-254 Electronically Signed   By:  Duanne Guess D.O.   On: 12/19/2022 18:56   DG Ankle Complete Left  Result Date: 12/19/2022 CLINICAL DATA:  fall; pain, possible in the L hip joint. eval knee and mid femur EXAM: LEFT ANKLE COMPLETE - 3+ VIEW; LEFT KNEE - 1-2 VIEW COMPARISON:  None Available. FINDINGS: No acute fracture or dislocation. No aggressive osseous lesion. There are degenerative changes of the knee joint in the form of mildly reduced medial tibio-femoral compartment joint space, tibial spiking and osteophytosis. Ankle mortise is intact. Calcaneal spur noted along the Achilles tendon and Plantar aponeurosis attachment sites. No knee effusion or focal soft tissue swelling. No radiopaque foreign bodies. IMPRESSION: *No acute osseous abnormality of the left knee or ankle. Electronically Signed   By: Jules Schick M.D.   On: 12/19/2022 16:25   DG Knee 2 Views Left  Result Date: 12/19/2022 CLINICAL DATA:  fall; pain, possible in the L hip joint. eval knee and mid femur EXAM: LEFT ANKLE COMPLETE - 3+ VIEW; LEFT KNEE - 1-2 VIEW COMPARISON:  None Available. FINDINGS: No acute fracture or dislocation. No aggressive osseous lesion. There are degenerative changes of the knee joint in the form of mildly reduced medial tibio-femoral compartment joint space, tibial spiking and osteophytosis. Ankle mortise is intact. Calcaneal spur noted along the Achilles tendon and Plantar aponeurosis attachment sites. No knee effusion or focal soft tissue swelling. No radiopaque foreign bodies. IMPRESSION: *No acute osseous abnormality of the left knee or ankle. Electronically Signed   By: Jules Schick M.D.   On: 12/19/2022 16:25   DG Hip Unilat W or Wo Pelvis 2-3 Views Left  Result Date: 12/19/2022 CLINICAL DATA:  Fall.  Left hip pain. EXAM: DG HIP (WITH OR WITHOUT PELVIS) 2-3V LEFT COMPARISON:  05/08/2016. FINDINGS: Pelvis is intact with normal and symmetric sacroiliac joints. No acute fracture or dislocation. No aggressive osseous lesion.  Visualized sacral arcuate lines are unremarkable. Unremarkable symphysis pubis. There are mild degenerative changes of the right hip joint with mild joint space narrowing and osteophytosis of the superior acetabulum. No significant degenerative changes of the left hip joint. No radiopaque foreign bodies. IMPRESSION: *No acute osseous abnormality of the pelvis. Electronically Signed   By: Jules Schick M.D.   On: 12/19/2022 16:24    Microbiology: Recent Results (from the past 240 hour(s))  Culture, blood (Routine x 2)     Status: None   Collection Time: 01/02/23  5:53 PM   Specimen: BLOOD  Result Value Ref Range Status   Specimen Description BLOOD BLOOD LEFT ARM  Final   Special Requests   Final    BOTTLES DRAWN AEROBIC AND ANAEROBIC Blood Culture adequate volume   Culture   Final    NO GROWTH 5 DAYS Performed at [facility], [address], [place], [place] [address]    Report Status 01/07/2023 FINAL  Final  Culture, blood (Routine x 2)     Status: None   Collection Time: 01/02/23  6:00 PM   Specimen: BLOOD  Result Value Ref Range Status   Specimen Description BLOOD BLOOD RIGHT ARM  Final   Special Requests   Final    BOTTLES DRAWN AEROBIC AND ANAEROBIC Blood Culture adequate volume   Culture   Final    NO GROWTH 5 DAYS Performed at [facility], [address], [place], [place] [address]    Report Status 01/07/2023 FINAL  Final  Resp panel by RT-PCR (RSV, Flu A&B, Covid) Anterior Nasal Swab     Status: None   Collection Time: 01/03/23 12:53 AM   Specimen: Anterior Nasal Swab  Result Value Ref Range Status   SARS Coronavirus 2 by RT PCR NEGATIVE NEGATIVE Final    Comment: (NOTE) SARS-CoV-2 target nucleic acids are NOT DETECTED.  The SARS-CoV-2 RNA is generally detectable in upper respiratory specimens during the acute phase of infection. The lowest concentration of SARS-CoV-2 viral copies this assay can detect is 138 copies/mL. A negative result does not  preclude SARS-Cov-2 infection and should not be used as the sole basis for treatment or other patient management decisions. A negative result may occur with  improper specimen collection/handling, submission of specimen other than nasopharyngeal swab, presence of viral mutation(s) within the areas targeted by this assay, and inadequate number of viral copies(<138 copies/mL). A negative result must be combined with clinical observations, patient history, and epidemiological information. The expected result is Negative.  Fact Sheet for Patients:  BloggerCourse.com  Fact Sheet for Healthcare Providers:  SeriousBroker.it  This test is no t yet approved or cleared by the [place] FDA and  has been authorized for detection and/or diagnosis of SARS-CoV-2 by FDA under an Emergency Use Authorization (EUA). This EUA will remain  in effect (meaning this test can be used) for the duration of the COVID-19 declaration under Section 564(b)(1) of the Act, 21 U.S.C.section 360bbb-3(b)(1), unless the authorization is terminated  or revoked sooner.       Influenza A by PCR NEGATIVE NEGATIVE Final   Influenza B by PCR NEGATIVE NEGATIVE Final    Comment: (NOTE) The Xpert Xpress SARS-CoV-2/FLU/RSV plus assay is intended as an aid in the diagnosis of influenza from Nasopharyngeal swab specimens and should not be used as a sole basis for treatment. Nasal washings and aspirates are unacceptable for Xpert Xpress SARS-CoV-2/FLU/RSV testing.  Fact Sheet for Patients: BloggerCourse.com  Fact Sheet for Healthcare Providers: SeriousBroker.it  This test is not yet approved or cleared by the [place] FDA and has been authorized for detection and/or diagnosis of SARS-CoV-2 by FDA under an Emergency Use Authorization (EUA). This EUA will remain in effect (meaning this test can be used) for the  duration of the COVID-19 declaration under Section 564(b)(1) of the Act, 21 U.S.C. section 360bbb-3(b)(1), unless the authorization is terminated or revoked.     Resp Syncytial Virus by PCR NEGATIVE NEGATIVE Final    Comment: (NOTE) Fact Sheet for Patients: BloggerCourse.com  Fact Sheet for Healthcare Providers: SeriousBroker.it  This test is not yet approved or cleared by the [place] FDA and has been authorized for detection and/or diagnosis of SARS-CoV-2 by FDA under an Emergency Use Authorization (EUA). This EUA will remain in effect (meaning this test can be used) for the duration of the COVID-19 declaration under Section 564(b)(1) of the Act, 21 U.S.C. section 360bbb-3(b)(1), unless the authorization is terminated or revoked.  Performed at [facility], [address] [place], [place] [address]   Urine Culture  Status: Abnormal   Collection Time: 01/03/23 12:53 AM   Specimen: Urine, Random  Result Value Ref Range Status   Specimen Description   Final    URINE, RANDOM Performed at [facility], [address] [place], [place] [address]    Special Requests   Final    NONE Performed at [facility], [address] [place], [place] [address]    Culture 50,000 COLONIES/mL ESCHERICHIA COLI (A)  Final   Report Status 01/05/2023 FINAL  Final   Organism ID, Bacteria ESCHERICHIA COLI (A)  Final      Susceptibility   Escherichia coli - MIC*    AMPICILLIN <=2 SENSITIVE Sensitive     CEFAZOLIN <=4 SENSITIVE Sensitive     CEFEPIME <=0.12 SENSITIVE Sensitive     CEFTRIAXONE <=0.25 SENSITIVE Sensitive     CIPROFLOXACIN <=0.25 SENSITIVE Sensitive     GENTAMICIN <=1 SENSITIVE Sensitive     IMIPENEM <=0.25 SENSITIVE Sensitive     NITROFURANTOIN <=16 SENSITIVE Sensitive     TRIMETH/SULFA <=20 SENSITIVE Sensitive     AMPICILLIN/SULBACTAM <=2 SENSITIVE Sensitive     PIP/TAZO <=4 SENSITIVE  Sensitive ug/mL    * 50,000 COLONIES/mL ESCHERICHIA COLI  Respiratory (~20 pathogens) panel by PCR     Status: None   Collection Time: 01/03/23 10:40 AM   Specimen: Nasopharyngeal Swab; Respiratory  Result Value Ref Range Status   Adenovirus NOT DETECTED NOT DETECTED Final   Coronavirus 229E NOT DETECTED NOT DETECTED Final    Comment: (NOTE) The Coronavirus on the Respiratory Panel, DOES NOT test for the novel  Coronavirus (2019 nCoV)    Coronavirus HKU1 NOT DETECTED NOT DETECTED Final   Coronavirus NL63 NOT DETECTED NOT DETECTED Final   Coronavirus OC43 NOT DETECTED NOT DETECTED Final   Metapneumovirus NOT DETECTED NOT DETECTED Final   Rhinovirus / Enterovirus NOT DETECTED NOT DETECTED Final   Influenza A NOT DETECTED NOT DETECTED Final   Influenza B NOT DETECTED NOT DETECTED Final   Parainfluenza Virus 1 NOT DETECTED NOT DETECTED Final   Parainfluenza Virus 2 NOT DETECTED NOT DETECTED Final   Parainfluenza Virus 3 NOT DETECTED NOT DETECTED Final   Parainfluenza Virus 4 NOT DETECTED NOT DETECTED Final   Respiratory Syncytial Virus NOT DETECTED NOT DETECTED Final   Bordetella pertussis NOT DETECTED NOT DETECTED Final   Bordetella Parapertussis NOT DETECTED NOT DETECTED Final   Chlamydophila pneumoniae NOT DETECTED NOT DETECTED Final   Mycoplasma pneumoniae NOT DETECTED NOT DETECTED Final    Comment: Performed at [facility], [address] [place], [place] [address]     Labs: CBC: Recent Labs  Lab 01/02/23 1753 01/04/23 0611 01/07/23 0405 01/09/23 0508  WBC 9.2 9.8 9.6 9.0  NEUTROABS 7.3 8.0*  --   --   HGB 12.2 11.0* 11.5* 12.8  HCT 38.2 35.7* 37.1 40.8  MCV 92.3 94.9 95.1 94.4  PLT 219 211 269 330   Basic Metabolic Panel: Recent Labs  Lab 01/02/23 1753 01/04/23 0611 01/05/23 0459 01/07/23 0405 01/09/23 0508  NA 131* 138 140 139 141  K 3.8 3.8 4.6 4.2 4.3  CL 97* 103 101 98 96*  CO2 24 29 32 33* 36*  GLUCOSE 224* 166* 118* 122* 96  BUN 29* 35* 32*  28* 32*  CREATININE 1.36* 1.15* 1.10* 0.87 1.25*  CALCIUM 8.4* 8.5* 8.7* 8.5* 8.7*  MG  --   --  2.5* 2.3 2.3   Liver Function Tests: Recent Labs  Lab 01/02/23 1753  AST 29  ALT 14  ALKPHOS 58  BILITOT 0.9  PROT 6.9  ALBUMIN 3.1*   No results for input(s): "LIPASE", "AMYLASE" in the last 168 hours. No results for input(s): "AMMONIA" in the last 168 hours. Cardiac Enzymes: No results for input(s): "CKTOTAL", "CKMB", "CKMBINDEX", "TROPONINI" in the last 168 hours. BNP (last 3 results) No results for input(s): "BNP" in the last 8760 hours. CBG: Recent Labs  Lab 01/08/23 1141 01/08/23 1610 01/08/23 2123 01/09/23 0804 01/09/23 1128  GLUCAP 93 132* 166* 83 127*    Time spent: 35 minutes  Signed:  Gillis Santa  [facility] 01/09/2023 1:36 PM

## 2023-01-09 NOTE — TOC Transition Note (Signed)
Transition of Care [facility]) - CM/SW Discharge Note   Patient Details  Name: [name] MRN: [id number] Date of Birth: April 02, 1950  Transition of Care [facility] CM/SW Contact:  Margarito Liner, LCSW Phone Number: 01/09/2023, 2:29 PM   Clinical Narrative:   Patient has orders to discharge to [facility]. RN will call report to [phone] (Room 505). EMS transport set up for 3:30. No further concerns. CSW signing off.  Final next level of care: Skilled Nursing Facility Barriers to Discharge: Barriers Resolved   Patient Goals and CMS Choice   Choice offered to / list presented to : Patient  Discharge Placement     Existing PASRR number confirmed : 01/06/23          Patient chooses bed at: [facility] Patient to be transferred to facility by: EMS Name of family member notified: Herbie Drape. Patient gave permission to leave him a detailed voicemail. Patient and family notified of of transfer: 01/09/23  Discharge Plan and Services Additional resources added to the After Visit Summary for                                       Social Determinants of Health (SDOH) Interventions SDOH Screenings   Food Insecurity: No Food Insecurity (01/04/2023)  Housing: Low Risk  (01/04/2023)  Transportation Needs: No Transportation Needs (01/04/2023)  Utilities: Not At Risk (01/04/2023)  Alcohol Screen: Low Risk  (08/28/2022)  Depression (PHQ2-9): Medium Risk (08/28/2022)  Financial Resource Strain: Low Risk  (04/27/2022)  Physical Activity: Inactive (04/27/2022)  Social Connections: Moderately Isolated (04/27/2022)  Stress: No Stress Concern Present (04/27/2022)  Tobacco Use: Medium Risk (01/04/2023)     Readmission Risk Interventions    01/05/2023   12:33 PM  Readmission Risk Prevention Plan  Transportation Screening Complete  PCP or Specialist Appt within 3-5 Days Complete  HRI or Home Care Consult Complete  Social Work Consult for Recovery Care  Planning/Counseling Complete  Palliative Care Screening Not Applicable  Medication Review Oceanographer) Complete

## 2023-01-09 NOTE — Plan of Care (Signed)
IV removed, discharge instructions reviewed and patient discharged via EMS to [facility].  Unable to call report

## 2023-01-09 NOTE — TOC Progression Note (Addendum)
Transition of Care [facility]) - Progression Note    Patient Details  Name: [name] MRN: [id number] Date of Birth: Feb 06, 1950  [facility] CM/SW Contact  Margarito Liner, LCSW Phone Number: 01/09/2023, 10:40 AM  Clinical Narrative:  CSW left message for [facility] admissions coordinator to see if they can accept patient today.   12:01 pm: [facility] is checking on bed availability for today.  1:13 pm: SNF can take her today. CSW asked MD to have dc summary in before 2:00.  Expected Discharge Plan: Home/Self Care Barriers to Discharge: Continued Medical Work up  Expected Discharge Plan and Services       Living arrangements for the past 2 months: Single Family Home                                       Social Determinants of Health (SDOH) Interventions SDOH Screenings   Food Insecurity: No Food Insecurity (01/04/2023)  Housing: Low Risk  (01/04/2023)  Transportation Needs: No Transportation Needs (01/04/2023)  Utilities: Not At Risk (01/04/2023)  Alcohol Screen: Low Risk  (08/28/2022)  Depression (PHQ2-9): Medium Risk (08/28/2022)  Financial Resource Strain: Low Risk  (04/27/2022)  Physical Activity: Inactive (04/27/2022)  Social Connections: Moderately Isolated (04/27/2022)  Stress: No Stress Concern Present (04/27/2022)  Tobacco Use: Medium Risk (01/04/2023)    Readmission Risk Interventions    01/05/2023   12:33 PM  Readmission Risk Prevention Plan  Transportation Screening Complete  PCP or Specialist Appt within 3-5 Days Complete  HRI or Home Care Consult Complete  Social Work Consult for Recovery Care Planning/Counseling Complete  Palliative Care Screening Not Applicable  Medication Review Oceanographer) Complete

## 2023-01-10 ENCOUNTER — Other Ambulatory Visit: Payer: Self-pay

## 2023-01-10 ENCOUNTER — Emergency Department
Admission: EM | Admit: 2023-01-10 | Discharge: 2023-01-10 | Disposition: A | Discharge status: Home / Self Care | Payer: PPO | Attending: Emergency Medicine | Admitting: Emergency Medicine

## 2023-01-10 ENCOUNTER — Emergency Department: Payer: PPO

## 2023-01-10 DIAGNOSIS — Z79899 Other long term (current) drug therapy: Secondary | ICD-10-CM | POA: Insufficient documentation

## 2023-01-10 DIAGNOSIS — R112 Nausea with vomiting, unspecified: Secondary | ICD-10-CM | POA: Insufficient documentation

## 2023-01-10 DIAGNOSIS — Z1152 Encounter for screening for COVID-19: Secondary | ICD-10-CM | POA: Insufficient documentation

## 2023-01-10 DIAGNOSIS — K5730 Diverticulosis of large intestine without perforation or abscess without bleeding: Secondary | ICD-10-CM | POA: Diagnosis not present

## 2023-01-10 DIAGNOSIS — E039 Hypothyroidism, unspecified: Secondary | ICD-10-CM | POA: Diagnosis not present

## 2023-01-10 DIAGNOSIS — E119 Type 2 diabetes mellitus without complications: Secondary | ICD-10-CM | POA: Insufficient documentation

## 2023-01-10 DIAGNOSIS — J449 Chronic obstructive pulmonary disease, unspecified: Secondary | ICD-10-CM | POA: Insufficient documentation

## 2023-01-10 DIAGNOSIS — R197 Diarrhea, unspecified: Secondary | ICD-10-CM | POA: Insufficient documentation

## 2023-01-10 DIAGNOSIS — Z7982 Long term (current) use of aspirin: Secondary | ICD-10-CM | POA: Insufficient documentation

## 2023-01-10 DIAGNOSIS — I10 Essential (primary) hypertension: Secondary | ICD-10-CM | POA: Insufficient documentation

## 2023-01-10 LAB — CBC WITH DIFFERENTIAL/PLATELET
Abs Immature Granulocytes: 0.21 10*3/uL — ABNORMAL HIGH (ref 0.00–0.07)
Basophils Absolute: 0 10*3/uL (ref 0.0–0.1)
Basophils Relative: 0 %
Eosinophils Absolute: 0.5 10*3/uL (ref 0.0–0.5)
Eosinophils Relative: 4 %
HCT: 46.1 % — ABNORMAL HIGH (ref 36.0–46.0)
Hemoglobin: 14.4 g/dL (ref 12.0–15.0)
Immature Granulocytes: 2 %
Lymphocytes Relative: 5 %
Lymphs Abs: 0.6 10*3/uL — ABNORMAL LOW (ref 0.7–4.0)
MCH: 29.5 pg (ref 26.0–34.0)
MCHC: 31.2 g/dL (ref 30.0–36.0)
MCV: 94.5 fL (ref 80.0–100.0)
Monocytes Absolute: 0.6 10*3/uL (ref 0.1–1.0)
Monocytes Relative: 6 %
Neutro Abs: 9.1 10*3/uL — ABNORMAL HIGH (ref 1.7–7.7)
Neutrophils Relative %: 83 %
Platelets: 349 10*3/uL (ref 150–400)
RBC: 4.88 MIL/uL (ref 3.87–5.11)
RDW: 13.3 % (ref 11.5–15.5)
WBC: 11 10*3/uL — ABNORMAL HIGH (ref 4.0–10.5)
nRBC: 0 % (ref 0.0–0.2)

## 2023-01-10 LAB — URINALYSIS, ROUTINE W REFLEX MICROSCOPIC
Bacteria, UA: NONE SEEN
Bilirubin Urine: NEGATIVE
Glucose, UA: NEGATIVE mg/dL
Ketones, ur: NEGATIVE mg/dL
Leukocytes,Ua: NEGATIVE
Nitrite: NEGATIVE
Protein, ur: NEGATIVE mg/dL
Specific Gravity, Urine: 1.018 (ref 1.005–1.030)
pH: 6 (ref 5.0–8.0)

## 2023-01-10 LAB — RESP PANEL BY RT-PCR (RSV, FLU A&B, COVID)  RVPGX2
Influenza A by PCR: NEGATIVE
Influenza B by PCR: NEGATIVE
Resp Syncytial Virus by PCR: NEGATIVE
SARS Coronavirus 2 by RT PCR: NEGATIVE

## 2023-01-10 LAB — COMPREHENSIVE METABOLIC PANEL
ALT: 22 U/L (ref 0–44)
AST: 21 U/L (ref 15–41)
Albumin: 3 g/dL — ABNORMAL LOW (ref 3.5–5.0)
Alkaline Phosphatase: 63 U/L (ref 38–126)
Anion gap: 11 (ref 5–15)
BUN: 31 mg/dL — ABNORMAL HIGH (ref 8–23)
CO2: 29 mmol/L (ref 22–32)
Calcium: 8.5 mg/dL — ABNORMAL LOW (ref 8.9–10.3)
Chloride: 96 mmol/L — ABNORMAL LOW (ref 98–111)
Creatinine, Ser: 1.18 mg/dL — ABNORMAL HIGH (ref 0.44–1.00)
GFR, Estimated: 49 mL/min — ABNORMAL LOW (ref >60)
Glucose, Bld: 173 mg/dL — ABNORMAL HIGH (ref 70–99)
Potassium: 4 mmol/L (ref 3.5–5.1)
Sodium: 136 mmol/L (ref 135–145)
Total Bilirubin: 0.5 mg/dL (ref <1.2)
Total Protein: 6.4 g/dL — ABNORMAL LOW (ref 6.5–8.1)

## 2023-01-10 LAB — LIPASE, BLOOD: Lipase: 31 U/L (ref 11–51)

## 2023-01-10 LAB — TROPONIN I (HIGH SENSITIVITY)
Troponin I (High Sensitivity): 19 ng/L — ABNORMAL HIGH (ref <18)
Troponin I (High Sensitivity): 28 ng/L — ABNORMAL HIGH (ref <18)

## 2023-01-10 MED ORDER — IOHEXOL 300 MG/ML  SOLN
100.0000 mL | Freq: Once | INTRAMUSCULAR | Status: AC | PRN
Start: 2023-01-10 — End: 2023-01-10
  Administered 2023-01-10: 100 mL via INTRAVENOUS

## 2023-01-10 MED ORDER — SODIUM CHLORIDE 0.9 % IV BOLUS
500.0000 mL | Freq: Once | INTRAVENOUS | Status: AC
Start: 2023-01-10 — End: 2023-01-10
  Administered 2023-01-10: 500 mL via INTRAVENOUS

## 2023-01-10 MED ORDER — ONDANSETRON 4 MG PO TBDP: 4.0000 mg | ORAL_TABLET | Freq: Three times a day (TID) | ORAL | 0 refills | Status: DC | PRN

## 2023-01-10 MED ORDER — ONDANSETRON HCL 4 MG/2ML IJ SOLN
4.0000 mg | Freq: Once | INTRAMUSCULAR | Status: AC
  Administered 2023-01-10: 4 mg via INTRAVENOUS
  Filled 2023-01-10: qty 2

## 2023-01-10 NOTE — ED Notes (Signed)
Attempted to call report to nursing home, unable to get a hold of staff

## 2023-01-10 NOTE — Discharge Instructions (Signed)
You may take Zofran as needed for nausea or vomiting.  Clear liquid diet x 12 hours, then bland diet x 3 days, then slowly advance diet as tolerated.  Return to the ER for recurrent or worsening symptoms, persistent vomiting, difficulty breathing or other concerns.

## 2023-01-10 NOTE — ED Triage Notes (Signed)
Patient sent over from [facility] complaints of nausea vomiting and diarrhea

## 2023-01-10 NOTE — ED Notes (Signed)
Report given to [place] at [facility]

## 2023-01-10 NOTE — ED Provider Notes (Signed)
[facility] Provider Note    Event Date/Time   First MD Initiated Contact with Patient 01/10/23 0041     (approximate)   History   Nausea, Emesis, and Diarrhea   HPI  [name] is a 72 y.o. female to the ED via EMS from [facility] with a chief complaint of nausea/vomiting/diarrhea x 1 day.  Patient with recent hospitalization for respiratory failure/pneumonia.  Endorses abdominal cramps.  Denies fever/chills, chest pain, shortness of breath, dizziness.     Past Medical History   Past Medical History:  Diagnosis Date   Anxiety    Arthritis    Chronic back pain    COPD (chronic obstructive pulmonary disease) (HCC)    Depression    GERD (gastroesophageal reflux disease)    Hypertension    Hypothyroidism    Pneumonia    RECENT X 3   PONV (postoperative nausea and vomiting)    Psoriasis    Thyroid disease      Active Problem List   Patient Active Problem List   Diagnosis Date Noted   Acute respiratory failure with hypoxia (HCC) 01/03/2023   AKI (acute kidney injury) (HCC) 01/03/2023   COPD with acute exacerbation (HCC) 04/12/2022   CAP (community acquired pneumonia) 04/12/2022   Psoriasis 07/26/2020   Class 1 obesity due to excess calories with serious comorbidity and body mass index (BMI) of 34.0 to 34.9 in adult 07/26/2020   COPD (chronic obstructive pulmonary disease) (HCC) 10/28/2017   Thoracic aortic atherosclerosis (HCC) 02/12/2017   Osteoarthritis 05/07/2016   Insomnia 01/10/2016   Bilateral carotid artery stenosis 08/22/2015   Benign essential HTN 08/04/2015   Mixed hyperlipidemia 08/04/2015   Atherosclerotic peripheral vascular disease with intermittent claudication (HCC) 08/04/2015   Panic disorder without agoraphobia with moderate panic attacks 07/01/2015   DM (diabetes mellitus), type 2 (HCC) 06/09/2015   Anxiety and depression 06/23/2014   Chronic, continuous use of opioids 07/28/2013   UTI (urinary tract infection)  05/06/2012   Esophageal reflux 07/01/2009   Hypothyroidism 01/11/2009     Past Surgical History   Past Surgical History:  Procedure Laterality Date   ABDOMINAL HYSTERECTOMY     heavy bleeding   bladder stimulator     CARPAL TUNNEL RELEASE     CATARACT EXTRACTION W/PHACO Right 03/07/2015   Procedure: CATARACT EXTRACTION PHACO AND INTRAOCULAR LENS PLACEMENT (IOC);  Surgeon: Sallee Lange, MD;  Location: [facility] ORS;  Service: Ophthalmology;  Laterality: Right;  [name] 01:14 AP% 26.4 CDE 33.04 fluid pack lot # [id number]   CATARACT EXTRACTION W/PHACO Left 03/21/2015   Procedure: CATARACT EXTRACTION PHACO AND INTRAOCULAR LENS PLACEMENT (IOC);  Surgeon: Sallee Lange, MD;  Location: [facility] ORS;  Service: Ophthalmology;  Laterality: Left;  [name] 01:21 AP% 25.6 CDE 39.78 fluid pack lot # [id number]   HAND SURGERY Right    INTERSTIM IMPLANT REMOVAL N/A 09/07/2015   Procedure: REMOVAL OF INTERSTIM IMPLANT;  Surgeon: Alfredo Martinez, MD;  Location: [facility] ORS;  Service: Urology;  Laterality: N/A;   OOPHORECTOMY Right    ovarian cyst     Home Medications   Prior to Admission medications   Medication Sig Start Date End Date Taking? Authorizing Provider  acetaminophen (TYLENOL) 500 MG tablet Take 500 mg by mouth every 6 (six) hours as needed for moderate pain.     [provider]  albuterol (VENTOLIN HFA) 108 (90 Base) MCG/ACT inhaler INHALE 2 PUFFS BY MOUTH EVERY 6 HOURS AS NEEDED FOR WHEEZING OR SHORTNESS OF BREATH 04/16/22  Sunnie Nielsen, DO  aspirin EC (ASPIRIN LOW DOSE) 81 MG tablet TAKE 1 TABLET(81 MG) BY MOUTH DAILY. SWALLOW WHOLE 11/07/22   Lorre Munroe, NP  carvedilol (COREG) 3.125 MG tablet TAKE 1 TABLET BY MOUTH TWICE DAILY WITH A MEAL 11/05/22   Lorre Munroe, NP  citalopram (CELEXA) 40 MG tablet TAKE 1 TABLET(40 MG) BY MOUTH DAILY 12/13/22   Baity, Salvadore Oxford, NP  clonazePAM (KLONOPIN) 1 MG tablet TAKE 1 TABLET BY MOUTH EVERY MORNING, 1/2 TABLET EVERY AFTERNOON AND 1 TABLET  EVERY NIGHT AT BEDTIME 01/09/23   Gillis Santa, MD  diltiazem (CARDIZEM CD) 120 MG 24 hr capsule TAKE 1 CAPSULE BY MOUTH EVERY DAY 11/05/22   Lorre Munroe, NP  doxepin (SINEQUAN) 25 MG capsule Take 50 mg by mouth at bedtime. 11/01/22   [provider]  fenofibrate (TRICOR) 145 MG tablet TAKE 1 TABLET(145 MG) BY MOUTH DAILY 09/20/22   Lorre Munroe, NP  fluticasone furoate-vilanterol (BREO ELLIPTA) 100-25 MCG/ACT AEPB Inhale 1 puff into the lungs daily. 04/16/22   Sunnie Nielsen, DO  HYDROcodone-acetaminophen (NORCO/VICODIN) 5-325 MG tablet Take 1 tablet by mouth every 6 (six) hours as needed for severe pain (pain score 7-10). 01/09/23 01/09/24  Gillis Santa, MD  hydrOXYzine (ATARAX) 25 MG tablet Take 25 mg by mouth at bedtime. 01/24/21   [provider]  ipratropium-albuterol (DUONEB) 0.5-2.5 (3) MG/3ML SOLN Take 3 mLs by nebulization every 6 (six) hours as needed (wheezing / shortness of breath). 04/16/22   Sunnie Nielsen, DO  levothyroxine (SYNTHROID) 100 MCG tablet Take 1 tablet (100 mcg total) by mouth daily. 10/04/22   Lorre Munroe, NP  lisinopril (ZESTRIL) 10 MG tablet TAKE 1 TABLET(10 MG) BY MOUTH DAILY 11/05/22   Lorre Munroe, NP  Misc. Devices (QUAD CANE/SMALL BASE) MISC 1 Device by Does not apply route daily. 06/26/17   Galen Manila, NP  pantoprazole (PROTONIX) 40 MG tablet SMARTSIG:1 Tablet(s) By Mouth Every Evening 11/30/22   [provider]  rosuvastatin (CRESTOR) 20 MG tablet Take 1 tablet (20 mg total) by mouth daily. 08/30/22   Lorre Munroe, NP  Vitamin D, Ergocalciferol, (DRISDOL) 1.25 MG (50000 UNIT) CAPS capsule Take 50,000 Units by mouth 2 (two) times a week. Patient not taking: Reported on 01/03/2023 03/16/21   [provider]     Allergies  Ciprofloxacin, Mucinex [guaifenesin er], Oxycodone, Oxycodone-acetaminophen, Oxycodone-acetaminophen, and Sulfa antibiotics   Family History   Family History  Problem Relation  Age of Onset   Heart disease Mother    Breast cancer Mother    Heart attack Mother    COPD Father    Stroke Paternal Uncle    Kidney disease Sister    Breast cancer Cousin    Breast cancer Cousin    Heart attack Maternal Aunt    Stroke Paternal Grandfather    Kidney cancer Neg Hx    Bladder Cancer Neg Hx      Physical Exam  Triage Vital Signs: ED Triage Vitals  Encounter Vitals Group     BP      Systolic BP Percentile      Diastolic BP Percentile      Pulse      Resp      Temp      Temp src      SpO2      Weight      Height      Head Circumference      Peak Flow  Pain Score      Pain Loc      Pain Education      Exclude from Growth Chart     Updated Vital Signs: BP (!) 133/54 (BP Location: Left Arm)   Pulse 76   Temp 98.8 F (37.1 C) (Oral)   Resp 17   Ht 5\' 3"  (1.6 m)   Wt 90.7 kg   SpO2 99%   BMI 35.42 kg/m    General: Awake, mild distress.  CV:  RRR.  Good peripheral perfusion.  Resp:  Normal effort.  Diminished, otherwise CTAB. Abd:  Obese, nontender.  No distention.  Other:  No truncal vesicles.   ED Results / Procedures / Treatments  Labs (all labs ordered are listed, but only abnormal results are displayed) Labs Reviewed  CBC WITH DIFFERENTIAL/PLATELET - Abnormal; Notable for the following components:      Result Value   WBC 11.0 (*)    HCT 46.1 (*)    Neutro Abs 9.1 (*)    Lymphs Abs 0.6 (*)    Abs Immature Granulocytes 0.21 (*)    All other components within normal limits  COMPREHENSIVE METABOLIC PANEL - Abnormal; Notable for the following components:   Chloride 96 (*)    Glucose, Bld 173 (*)    BUN 31 (*)    Creatinine, Ser 1.18 (*)    Calcium 8.5 (*)    Total Protein 6.4 (*)    Albumin 3.0 (*)    GFR, Estimated 49 (*)    All other components within normal limits  URINALYSIS, ROUTINE W REFLEX MICROSCOPIC - Abnormal; Notable for the following components:   Color, Urine YELLOW (*)    APPearance CLEAR (*)    Hgb urine  dipstick MODERATE (*)    All other components within normal limits  TROPONIN I (HIGH SENSITIVITY) - Abnormal; Notable for the following components:   Troponin I (High Sensitivity) 19 (*)    All other components within normal limits  TROPONIN I (HIGH SENSITIVITY) - Abnormal; Notable for the following components:   Troponin I (High Sensitivity) 28 (*)    All other components within normal limits  RESP PANEL BY RT-PCR (RSV, FLU A&B, COVID)  RVPGX2  C DIFFICILE QUICK SCREEN W PCR REFLEX    GASTROINTESTINAL PANEL BY PCR, STOOL (REPLACES STOOL CULTURE)  LIPASE, BLOOD     EKG  ED ECG REPORT I, Barrie Sigmund J, the attending physician, personally viewed and interpreted this ECG.   Date: 01/10/2023  EKG Time: 0119  Rate: 79  Rhythm: normal sinus rhythm  Axis: Normal  Intervals:none  ST&T Change: Nonspecific    RADIOLOGY I have independently visualized and interpreted patient's imaging study as well as noted the radiology interpretation:  Chest x-ray: Stable from prior  CT abdomen/pelvis: No acute intra-abdominal abnormality  Official radiology report(s): CT ABDOMEN PELVIS W CONTRAST  Result Date: 01/10/2023 CLINICAL DATA:  72 year old female with abdominal pain, nausea vomiting and diarrhea. EXAM: CT ABDOMEN AND PELVIS WITH CONTRAST TECHNIQUE: Multidetector CT imaging of the abdomen and pelvis was performed using the standard protocol following bolus administration of intravenous contrast. RADIATION DOSE REDUCTION: This exam was performed according to the departmental dose-optimization program which includes automated exposure control, adjustment of the mA and/or kV according to patient size and/or use of iterative reconstruction technique. CONTRAST:  OMNIPAQUE IOHEXOL 300 MG/ML  SOLN COMPARISON:  CTA chest 12/27/2020. FINDINGS: Lower chest: Chronic small pleural effusions in the costophrenic angles and atelectasis, not significantly changed from 2022. No  pericardial effusion.  Hepatobiliary: Negative liver and gallbladder. Pancreas: Negative. Spleen: Negative. Adrenals/Urinary Tract: 2.2 cm left adrenal nodule with density of 83 Hounsfield units. Contralateral right adrenal gland is normal. Nonobstructed kidneys with bilateral nephrolithiasis or nephrocalcinosis and some bilateral renal cortical scarring (coronal image 73). Both ureters appear normal to the bladder. No renal inflammation. Decompressed urinary bladder. Incidental pelvic phleboliths. Stomach/Bowel: Nondilated large bowel. Mild retained stool. Diverticulosis from the splenic flexure intermittently to the sigmoid colon, with no areas of active inflammation identified. Ventral abdominal, umbilical mesenteric fat containing hernia is 6.5 cm diameter (sagittal image 69) and this appears to be herniated transverse mesocolon. No herniated bowel. No regional inflammation. Small ventral abdominal wall probable subcutaneous injection sites on series 2, images 51 and 53. The cecum is on a lax mesentery located in the anterior right abdomen on series 2, image 47. Decompressed terminal ileum. Diminutive or absent appendix. No large bowel inflammation. Nondilated small bowel with many loops containing fluid. Gas distended stomach. Duodenum appears normal. No free air, free fluid, or mesenteric inflammation identified. Vascular/Lymphatic: Aortoiliac calcified atherosclerosis. Major arterial structures in the abdomen and pelvis are patent. No lymphadenopathy. Portal venous system is patent. There are prominent portal venous or mesenteric venous collaterals in the right perirectal space on series 2, image 76 which appear to communicate with the IMV. But no other significant varices identified. Reproductive: 5.2 cm simple fluid density left ovarian cyst (series 2, image 67). Uterus is surgically absent. Right ovary is diminutive or absent. Other: No pelvis free fluid. Musculoskeletal: Widespread advanced spinal disc degeneration with  multilevel vacuum disc. Lumbar facet degeneration. Chronic L4-L5 and L5-S1 midline laminectomy. No acute osseous abnormality identified. IMPRESSION: 1. No evidence of bowel obstruction or inflammation. Transverse mesocolon containing umbilical hernia appears non incarcerated. Diverticulosis in the distal large bowel without active inflammation. 2. Two incidental findings: - Left adrenal mass measuring 2.2 cm, probable benign adenoma. Recommend adrenal washout CT or chemical shift MRI. JACR 2017 Aug; 14(8):1038-44, JCAT 2016 Mar-Apr; 40(2):194-200, Urol J 2006 Spring; 3(2):71-4. - Left ovarian simple-appearing cyst measuring 5.2 cm. Recommend follow-up pelvic ultrasound in 6-12 months. Reference: JACR 2020 Feb;17(2):248-254. 3. Chronic small pleural effusions and lower lobe atelectasis. 4. Aortic Atherosclerosis (ICD10-I70.0). Electronically Signed   By: Odessa Fleming M.D.   On: 01/10/2023 04:10   DG Chest Port 1 View  Result Date: 01/10/2023 CLINICAL DATA:  Nausea, vomiting and diarrhea. EXAM: PORTABLE CHEST 1 VIEW COMPARISON:  January 02, 2023 FINDINGS: The cardiac silhouette is mildly enlarged and unchanged in size. There is marked severity calcification of the aortic arch. There is mild elevation of the left hemidiaphragm. Low lung volumes are also seen with mild, stable right infrahilar atelectasis and/or infiltrate. No pleural effusion or pneumothorax is identified. The visualized skeletal structures are unremarkable. IMPRESSION: Low lung volumes with mild, stable right infrahilar atelectasis and/or infiltrate. Electronically Signed   By: Aram Candela M.D.   On: 01/10/2023 01:48     PROCEDURES:  Critical Care performed: No  .1-3 Lead EKG Interpretation  Performed by: Irean Hong, MD Authorized by: Irean Hong, MD     Interpretation: normal     ECG rate:  85   ECG rate assessment: normal     Rhythm: sinus rhythm     Ectopy: none     Conduction: normal   Comments:     Patient placed on  cardiac monitor to evaluate for arrhythmias    MEDICATIONS ORDERED IN ED: Medications  sodium chloride 0.9 %  bolus 500 mL (0 mLs Intravenous Stopped 01/10/23 0229)  ondansetron (ZOFRAN) injection 4 mg (4 mg Intravenous Given 01/10/23 0229)  iohexol (OMNIPAQUE) 300 MG/ML solution 100 mL (100 mLs Intravenous Contrast Given 01/10/23 0158)     IMPRESSION / MDM / ASSESSMENT AND PLAN / ED COURSE  I reviewed the triage vital signs and the nursing notes.                             72 year old female presenting with nausea/vomiting/diarrhea. Differential diagnosis includes, but is not limited to, ovarian cyst, ovarian torsion, acute appendicitis, diverticulitis, C. difficile infection, urinary tract infection/pyelonephritis, endometriosis, bowel obstruction, colitis, renal colic, gastroenteritis, hernia, etc. I personally reviewed patient's records and note her recent hospitalization 11/27 - 01/09/2023 for acute respiratory failure, pneumonia.  Patient's presentation is most consistent with acute presentation with potential threat to life or bodily function.  The patient is on the cardiac monitor to evaluate for evidence of arrhythmia and/or significant heart rate changes.  Will obtain lab work, UA, CT abdomen/pelvis.  Administer judicious IV fluids and reassess.  Clinical Course as of 01/10/23 0515  Thu Jan 10, 2023  1610 Updated patient on unremarkable CT results.  She is feeling better.  Tolerating clear liquids without emesis.  Minimally elevated and clinically insignificant troponins likely secondary to distress as patient denies ever having chest pain or shortness of breath.  Will discharge back to facility with as needed Zofran.  Strict return precautions given.  Patient verbalizes understanding and agrees with plan of care. [JS]    Clinical Course User Index [JS] Irean Hong, MD     FINAL CLINICAL IMPRESSION(S) / ED DIAGNOSES   Final diagnoses:  Nausea vomiting and diarrhea      Rx / DC Orders   ED Discharge Orders     None        Note:  This document was prepared using Dragon voice recognition software and may include unintentional dictation errors.   Irean Hong, MD 01/10/23 [phone]

## 2023-01-10 NOTE — ED Notes (Signed)
[facility] called to transport patient back to [facility] spoke with [facility]

## 2023-01-10 NOTE — ED Notes (Signed)
Attempted to call report at [phone] and 0550 no answer at [facility]

## 2023-01-11 NOTE — Telephone Encounter (Signed)
[facility] confirmed appt for 02/04/23 at 10am with 9am xrays

## 2023-01-28 ENCOUNTER — Other Ambulatory Visit: Payer: Self-pay | Admitting: Internal Medicine

## 2023-01-28 DIAGNOSIS — F410 Panic disorder [episodic paroxysmal anxiety] without agoraphobia: Secondary | ICD-10-CM

## 2023-01-28 MED ORDER — CLONAZEPAM 1 MG PO TABS: ORAL_TABLET | ORAL | 0 refills | Status: DC

## 2023-01-28 NOTE — Telephone Encounter (Signed)
Medication Refill -  Most Recent Primary Care Visit:  Provider: [facility]  Department: [facility]  Visit Type: LAB  Date: 10/01/2022  Medication: clonazePAM (KLONOPIN) 1 MG tablet [[id number]]   Has the patient contacted their pharmacy? Yes (Agent: If no, request that the patient contact the pharmacy for the refill. If patient does not wish to contact the pharmacy document the reason why and proceed with request.) (Agent: If yes, when and what did the pharmacy advise?) [facility] #[id number] [place], [place] - [address] AT [facility] [address] Phone: [phone]  Fax: [phone]      Is this the correct pharmacy for this prescription? Yes If no, delete pharmacy and type the correct one.  This is the patient's preferred pharmacy:   Has the prescription been filled recently? No  Is the patient out of the medication? Yes  Has the patient been seen for an appointment in the last year OR does the patient have an upcoming appointment? No  Can we respond through MyChart? No  Agent: Please be advised that Rx refills may take up to 3 business days. We ask that you follow-up with your pharmacy.

## 2023-01-29 ENCOUNTER — Telehealth: Payer: Self-pay | Admitting: Internal Medicine

## 2023-01-29 NOTE — Telephone Encounter (Signed)
Requested medication (s) are due for refill today - no  Requested medication (s) are on the active medication list -yes  Future visit scheduled -yes  Last refill: 01/28/23 #75  Notes to clinic: duplicate request-non delegated Rx  Requested Prescriptions  Pending Prescriptions Disp Refills   clonazePAM (KLONOPIN) 1 MG tablet [Pharmacy Med Name: CLONAZEPAM 1MG  TABLETS] 75 tablet     Sig: TAKE 1 TABLET BY MOUTH EVERY MORNING, 1/2 TABLET EVERY AFTERNOON AND 1 TABLET EVERY NIGHT AT BEDTIME     Not Delegated - Psychiatry: Anxiolytics/Hypnotics 2 Failed - 01/29/2023  8:30 AM      Failed - This refill cannot be delegated      Failed - Urine Drug Screen completed in last 360 days      Passed - Patient is not pregnant      Passed - Valid encounter within last 6 months    Recent Outpatient Visits           5 months ago Encounter for general adult medical examination with abnormal findings   [place] [facility] [place], Salvadore Oxford, NP   9 months ago COPD exacerbation [facility])   [place] [facility] [place], Salvadore Oxford, NP   11 months ago Mixed hyperlipidemia   [place] [facility] [place], Salvadore Oxford, NP   1 year ago Acquired hypothyroidism   [place] [facility] [place], Salvadore Oxford, NP   1 year ago Screening for colon cancer   [place] [facility] [place], Salvadore Oxford, NP       Future Appointments             In 1 month Baity, Salvadore Oxford, NP [place] [facility], [facility]               Requested Prescriptions  Pending Prescriptions Disp Refills   clonazePAM (KLONOPIN) 1 MG tablet [Pharmacy Med Name: CLONAZEPAM 1MG  TABLETS] 75 tablet     Sig: TAKE 1 TABLET BY MOUTH EVERY MORNING, 1/2 TABLET EVERY AFTERNOON AND 1 TABLET EVERY NIGHT AT BEDTIME     Not Delegated - Psychiatry: Anxiolytics/Hypnotics 2 Failed - 01/29/2023  8:30 AM      Failed - This refill cannot be delegated      Failed -  Urine Drug Screen completed in last 360 days      Passed - Patient is not pregnant      Passed - Valid encounter within last 6 months    Recent Outpatient Visits           5 months ago Encounter for general adult medical examination with abnormal findings   [place] [facility] [place], Salvadore Oxford, NP   9 months ago COPD exacerbation [facility])   [place] [facility] [place], Salvadore Oxford, NP   11 months ago Mixed hyperlipidemia   [place] [facility] [place], Salvadore Oxford, NP   1 year ago Acquired hypothyroidism   [place] [facility] [place], Salvadore Oxford, NP   1 year ago Screening for colon cancer   [place] [facility] [place], Salvadore Oxford, NP       Future Appointments             In 1 month Baity, Salvadore Oxford, NP  [facility], [facility]

## 2023-01-29 NOTE — Telephone Encounter (Signed)
Pt called regarding refill but the Rx for clonazePAM (KLONOPIN) 1 MG tablet shows as print.

## 2023-01-31 ENCOUNTER — Other Ambulatory Visit: Payer: Self-pay | Admitting: Internal Medicine

## 2023-01-31 DIAGNOSIS — F410 Panic disorder [episodic paroxysmal anxiety] without agoraphobia: Secondary | ICD-10-CM

## 2023-01-31 NOTE — Telephone Encounter (Addendum)
Patient states she was hospitalized and the doctor prescribed a short supply. Patient states she was transferred to [facility] for 2 weeks and the doctor prescribed the medication. Patient just came home from [facility] on 01/26/2023 and is completley out. Patient would like a follow up call regarding the status.     [facility] #[id number] [place] Rough, [place] - [address] AT [facility] OF [address] Phone: [phone]  Fax: [phone]

## 2023-01-31 NOTE — Telephone Encounter (Signed)
Medication Refill -  Most Recent Primary Care Visit:  Provider: [facility]  Department: [facility]  Visit Type: LAB  Date: 10/01/2022  Medication: \clonazePAM (KLONOPIN) 1 MG tablet [[id number]]   Has the patient contacted their pharmacy? Yes (Agent: If no, request that the patient contact the pharmacy for the refill. If patient does not wish to contact the pharmacy document the reason why and proceed with request.) (Agent: If yes, when and what did the pharmacy advise?)  Is this the correct pharmacy for this prescription? Yes If no, delete pharmacy and type the correct one.  This is the patient's preferred pharmacy:  [facility] #[id number] [place]oles Rough, [place] - [address] AT [facility] OF [address] & [address] [place] [address] Phone: [phone] Fax: [phone]    Has the prescription been filled recently? No  Is the patient out of the medication? Yes  Has the patient been seen for an appointment in the last year OR does the patient have an upcoming appointment? Yes  Can we respond through MyChart? No  Agent: Please be advised that Rx refills may take up to 3 business days. We ask that you follow-up with your pharmacy.

## 2023-01-31 NOTE — Telephone Encounter (Signed)
Prescription was taken care of on 01/28/2023.

## 2023-02-01 ENCOUNTER — Other Ambulatory Visit: Payer: Self-pay

## 2023-02-01 ENCOUNTER — Other Ambulatory Visit: Payer: Self-pay | Admitting: Internal Medicine

## 2023-02-01 ENCOUNTER — Telehealth: Payer: Self-pay | Admitting: Internal Medicine

## 2023-02-01 DIAGNOSIS — F410 Panic disorder [episodic paroxysmal anxiety] without agoraphobia: Secondary | ICD-10-CM

## 2023-02-01 DIAGNOSIS — M48061 Spinal stenosis, lumbar region without neurogenic claudication: Secondary | ICD-10-CM

## 2023-02-01 MED ORDER — CLONAZEPAM 1 MG PO TABS: ORAL_TABLET | ORAL | 0 refills | Status: DC

## 2023-02-01 NOTE — Progress Notes (Unsigned)
Referring Physician:  No referring provider defined for this encounter.  Primary Physician:  Jill Munroe, NP  History of Present Illness: 02/04/2023 [name] is here today with a chief complaint of low back pain that goes into the left leg and into the foot x 3 months.  She states after a fall approximately 3 months ago she had new sharp shooting pain extending from her back down her left leg into her foot.  She states the top of her foot is completely numb and she has continued pins-and-needles feeling at the bottom of her foot.  This is associated with weakness and she has had 4 falls within the past month.  She complains in addition of muscle spasms the outside of her left leg.  She has been using Tylenol for pain primarily which she states does very little for her pain.  She rates her pain at a 8/10 which is made worse by walking and activity.  She is unable to get comfortable when laying down and is yet to find a position that is helpful.  She is undergone the rehab and is continuing home health PT. she had a previous lumbar surgery approximately 5 years ago.  The symptoms are causing a significant impact on the patient's life.   Review of Systems:  A 10 point review of systems is negative, except for the pertinent positives and negatives detailed in the HPI.  Past Medical History: Past Medical History:  Diagnosis Date   Anxiety    Arthritis    Chronic back pain    COPD (chronic obstructive pulmonary disease) (HCC)    Depression    GERD (gastroesophageal reflux disease)    Hypertension    Hypothyroidism    Pneumonia    RECENT X 3   PONV (postoperative nausea and vomiting)    Psoriasis    Thyroid disease     Past Surgical History: Past Surgical History:  Procedure Laterality Date   ABDOMINAL HYSTERECTOMY     heavy bleeding   bladder stimulator     CARPAL TUNNEL RELEASE     CATARACT EXTRACTION W/PHACO Right 03/07/2015   Procedure: CATARACT EXTRACTION PHACO AND  INTRAOCULAR LENS PLACEMENT (IOC);  Surgeon: Sallee Lange, MD;  Location: [facility];  Service: Ophthalmology;  Laterality: Right;  [name] 01:14 AP% 26.4 CDE 33.04 fluid pack lot # [id number]   CATARACT EXTRACTION W/PHACO Left 03/21/2015   Procedure: CATARACT EXTRACTION PHACO AND INTRAOCULAR LENS PLACEMENT (IOC);  Surgeon: Sallee Lange, MD;  Location: [facility];  Service: Ophthalmology;  Laterality: Left;  [name] 01:21 AP% 25.6 CDE 39.78 fluid pack lot # [id number]   HAND SURGERY Right    INTERSTIM IMPLANT REMOVAL N/A 09/07/2015   Procedure: REMOVAL OF INTERSTIM IMPLANT;  Surgeon: Alfredo Martinez, MD;  Location: [facility];  Service: Urology;  Laterality: N/A;   OOPHORECTOMY Right    ovarian cyst    Allergies: Allergies as of 02/04/2023 - Review Complete 02/04/2023  Allergen Reaction Noted   Ciprofloxacin Hives and Diarrhea 06/23/2014   Gabapentin Other (See Comments) 02/04/2023   Mucinex [guaifenesin er] Nausea Only 12/04/2020   Oxycodone Diarrhea 06/23/2014   Oxycodone-acetaminophen Diarrhea 09/16/2014   Oxycodone-acetaminophen Palpitations 08/16/2015   Sulfa antibiotics Rash 07/01/2015    Medications: Outpatient Encounter Medications as of 02/04/2023  Medication Sig   acetaminophen (TYLENOL) 500 MG tablet Take 500 mg by mouth every 6 (six) hours as needed for moderate pain.    albuterol (VENTOLIN HFA) 108 (90 Base) MCG/ACT inhaler INHALE 2  PUFFS BY MOUTH EVERY 6 HOURS AS NEEDED FOR WHEEZING OR SHORTNESS OF BREATH   aspirin EC (ASPIRIN LOW DOSE) 81 MG tablet TAKE 1 TABLET(81 MG) BY MOUTH DAILY. SWALLOW WHOLE   carvedilol (COREG) 3.125 MG tablet TAKE 1 TABLET BY MOUTH TWICE DAILY WITH A MEAL   citalopram (CELEXA) 40 MG tablet TAKE 1 TABLET(40 MG) BY MOUTH DAILY   clonazePAM (KLONOPIN) 1 MG tablet TAKE 1 TABLET BY MOUTH EVERY MORNING, 1/2 TABLET EVERY AFTERNOON AND 1 TABLET EVERY NIGHT AT BEDTIME   diltiazem (CARDIZEM CD) 120 MG 24 hr capsule TAKE 1 CAPSULE BY MOUTH EVERY DAY   doxepin  (SINEQUAN) 25 MG capsule Take 50 mg by mouth at bedtime.   fenofibrate (TRICOR) 145 MG tablet TAKE 1 TABLET(145 MG) BY MOUTH DAILY   fluticasone furoate-vilanterol (BREO ELLIPTA) 100-25 MCG/ACT AEPB Inhale 1 puff into the lungs daily.   hydrOXYzine (ATARAX) 25 MG tablet Take 25 mg by mouth at bedtime.   ipratropium-albuterol (DUONEB) 0.5-2.5 (3) MG/3ML SOLN Take 3 mLs by nebulization every 6 (six) hours as needed (wheezing / shortness of breath).   levothyroxine (SYNTHROID) 100 MCG tablet Take 1 tablet (100 mcg total) by mouth daily.   lisinopril (ZESTRIL) 10 MG tablet TAKE 1 TABLET(10 MG) BY MOUTH DAILY   Misc. Devices (QUAD CANE/SMALL BASE) MISC 1 Device by Does not apply route daily.   pantoprazole (PROTONIX) 40 MG tablet SMARTSIG:1 Tablet(s) By Mouth Every Evening   rosuvastatin (CRESTOR) 20 MG tablet Take 1 tablet (20 mg total) by mouth daily.   Vitamin D, Ergocalciferol, (DRISDOL) 1.25 MG (50000 UNIT) CAPS capsule Take 50,000 Units by mouth 2 (two) times a week.   [DISCONTINUED] clonazePAM (KLONOPIN) 1 MG tablet TAKE 1 TABLET BY MOUTH EVERY MORNING, 1/2 TABLET EVERY AFTERNOON AND 1 TABLET EVERY NIGHT AT BEDTIME   [DISCONTINUED] clonazePAM (KLONOPIN) 1 MG tablet TAKE 1 TABLET BY MOUTH EVERY MORNING, 1/2 TABLET EVERY AFTERNOON AND 1 TABLET EVERY NIGHT AT BEDTIME   [DISCONTINUED] HYDROcodone-acetaminophen (NORCO/VICODIN) 5-325 MG tablet Take 1 tablet by mouth every 6 (six) hours as needed for severe pain (pain score 7-10). (Patient not taking: Reported on 02/04/2023)   [DISCONTINUED] ondansetron (ZOFRAN-ODT) 4 MG disintegrating tablet Take 1 tablet (4 mg total) by mouth every 8 (eight) hours as needed for nausea or vomiting. (Patient not taking: Reported on 02/04/2023)   No facility-administered encounter medications on file as of 02/04/2023.    Social History: Social History   Tobacco Use   Smoking status: Former    Current packs/day: 0.00    Average packs/day: 1 pack/day for 35.0 years  (35.0 ttl pk-yrs)    Types: Cigarettes    Start date: 04/19/2018    Quit date: 11/16/2020    Years since quitting: 2.2   Smokeless tobacco: Never   Tobacco comments:    Previously smoked 2 ppd.   Vaping Use   Vaping status: Never Used  Substance Use Topics   Alcohol use: No   Drug use: No    Family Medical History: Family History  Problem Relation Age of Onset   Heart disease Mother    Breast cancer Mother    Heart attack Mother    COPD Father    Stroke Paternal Uncle    Kidney disease Sister    Breast cancer Cousin    Breast cancer Cousin    Heart attack Maternal Aunt    Stroke Paternal Grandfather    Kidney cancer Neg Hx    Bladder Cancer Neg Hx  Physical Examination: @VITALWITHPAIN @  General: Patient is well developed, well nourished, calm, collected, and in no apparent distress. Attention to examination is appropriate.  Psychiatric: Patient is non-anxious.  Head:  Pupils equal, round, and reactive to light.  ENT:  Oral mucosa appears well hydrated.  Neck:   Supple.    Respiratory: Patient is breathing without any difficulty.  Extremities: No edema.  Vascular: Palpable dorsal pedal pulses.  Skin:   On exposed skin, there are no abnormal skin lesions.  NEUROLOGICAL:     Awake, alert, oriented to person, place, and time.  Speech is clear and fluent. Fund of knowledge is appropriate.   Cranial Nerves: Pupils equal round and reactive to light.  Facial tone is symmetric.    Palpation of spine: lumbar paraspinals non tender.     +SLR of LLE   Side Iliopsoas Quads Hamstring PF DF EHL  R 5 5 5 5 5 5   L 5 5 5  4+ 4 4   Decreased sensation to left lower extremity compared to the right lower extremity, in particular to the foot.  Worse on the dorsal aspect compared to the plantar. 2+ patellar reflexes bilaterally.  1+ right Achilles reflex.  Absent left Achilles.  Hoffman's is absent.  Clonus is not present.  Toes are down-going.   Gait is abnormal.   Patient walks with a rollator. No difficulty with tandem gait.   No evidence of dysmetria noted.  Medical Decision Making  Imaging: MRI Lumbar Spine 01/2023: IMPRESSION: 1. Comparison is made to the prior lumbar spine MRI of 08/19/2006. 2. Spondylosis at the lumbar and visualized lower thoracic levels as outlined within the body of the report. 3. At L4-L5, there has been interval posterior decompression surgery. Progressive disc bulge. New large superimposed left subarticular disc extrusion with caudal migration to the lower L5 vertebral body level. Advanced facet arthropathy. The disc extrusion contributes to severe left subarticular/lateral recess stenosis, encroaching upon descending left-sided nerve roots (most notably the descending left L5 nerve root). Moderate to moderately severe central canal stenosis, progressed. Moderate bilateral neural foraminal narrowing (greater on the left), progressed. 4. At L3-L4, there is progressive multifactorial moderate-to-severe central canal stenosis. Mild bilateral neural foraminal narrowing. 5. At L5-S1, there is multifactorial right subarticular stenosis with potential to affect the descending right S1 nerve root. Moderate central canal stenosis, progressed. Mild bilateral neural foraminal narrowing, progressed. 6. No more than mild spinal canal or neural foraminal narrowing at the remaining levels. 7. Disc degeneration is greatest at L1-L2 (moderate-to-advanced), L2-L3 (moderate) and L3-L4 (moderate). 8. Mild degenerative endplate edema at Z6-X0 and L4-L5. 9. Grade 1 spondylolisthesis at L2-L3, L3-L4 and L5-S1  X-ray lumbar spine 4 view:  No acute fracture noted, but patient with severe degenerative changes of the spine.  No dynamic listhesis noted on flexion and extension.   I have personally reviewed the images and agree with the above interpretation.  Assessment and Plan: Ms. Harbach is a pleasant 72 y.o. female is here today  with a chief complaint of low back pain that goes into the left leg and into the foot x 3 months.  She states after a fall approximately 3 months ago she had new sharp shooting pain extending from her back down her left leg into her foot.  She states the top of her foot is completely numb and she has continued pins-and-needles feeling at the bottom of her foot.  This is associated with weakness and she has had 4 falls within the past month.  She complains in addition of muscle spasms the outside of her left leg.  She has been using Tylenol for pain primarily which she states does very little for her pain.  She rates her pain at a 8/10 which is made worse by walking and activity.  She is unable to get comfortable when laying down and is yet to find a position that is helpful.  She is undergone the rehab and is continuing home health PT. She has had a previous lumbar surgery approximately 5 years ago.  On examination patient has plantarflexion, dorsiflexion, and EHL weakness in the left lower extremity.  Positive straight leg raise.  Normoreflexic with the exception of absent Achilles reflex.  No clonus noted.  Gait is abnormal, patient walks with rollator for support.  MRI shows progressive disc bulge at L4-5 with a new large superimposed left disc extrusion with caudal migration at the level of L5 causing severe left lateral recess stenosis.  Moderate to severe central canal stenosis that has progressed as well as moderate to severe central canal stenosis at L3-4 and bilateral neuroforaminal narrowing and stenosis seen at L5-S1.  No dynamic listhesis was seen on flexion-extension x-rays.  Pleasure to see patient in clinic today.  We discussed her case at length.  Due to patient's weakness despite therapy as well as significant multilevel lumbar stenosis on her MRI and continued numbness and tingling; plan will be for L3-4 laminectomy, L4-5 laminectomy and discectomy on the left side, and L5-S1 laminectomy.  The  risks and benefits related to surgery were discussed at length with the patient and she would like to continue with the procedure.    Thank you for involving me in the care of this patient.   \  Joan Flores, PA-C Dept. of Neurosurgery     Addendum: I saw this patient after they were evaluated by Joan Flores, PA-C.  We discussed her symptoms including significant left lower extremity radiculopathy.  This is causing weakness in her left lower extremity and severe pain.  She has a positive straight leg raise.  She has a decreased Achilles reflex.  Her MRI shows severe stenosis secondary to a disc herniation at L4-5 with caudal migration.  She also has adjacent stenosis at L3-4.  She has had previous lumbar spine surgery and has recurrent stenosis at those levels.  We feel like she would benefit from a lumbar decompression.  The major disc herniation is at L4-5 but with its caudal migration will likely need to approach it both from L4-5 and below where she has had her previous surgery.  Given her significant stenosis above we will plan for an L3-S1 laminectomy with a discectomy at L4-5.  Risks and benefits were discussed with the patient.  She like to go forward with surgery.  Lovenia Kim, MD [facility] in [place]

## 2023-02-01 NOTE — Telephone Encounter (Signed)
Lori with [facility] called to say pt has some drug interactions.  Albuterol with Carvedilol  Fluticasone nose spray  Ipratrotium  Citalopram with hydroxyzine and with prilosec  Cb@  [phone]

## 2023-02-01 NOTE — Telephone Encounter (Signed)
Noted, continue current medications.

## 2023-02-01 NOTE — H&P (View-Only) (Signed)
 Referring Physician:  No referring provider defined for this encounter.  Primary Physician:  Lorre Munroe, NP  History of Present Illness: 02/04/2023 [name] is here today with a chief complaint of low back pain that goes into the left leg and into the foot x 3 months.  She states after a fall approximately 3 months ago she had new sharp shooting pain extending from her back down her left leg into her foot.  She states the top of her foot is completely numb and she has continued pins-and-needles feeling at the bottom of her foot.  This is associated with weakness and she has had 4 falls within the past month.  She complains in addition of muscle spasms the outside of her left leg.  She has been using Tylenol for pain primarily which she states does very little for her pain.  She rates her pain at a 8/10 which is made worse by walking and activity.  She is unable to get comfortable when laying down and is yet to find a position that is helpful.  She is undergone the rehab and is continuing home health PT. she had a previous lumbar surgery approximately 5 years ago.  The symptoms are causing a significant impact on the patient's life.   Review of Systems:  A 10 point review of systems is negative, except for the pertinent positives and negatives detailed in the HPI.  Past Medical History: Past Medical History:  Diagnosis Date   Anxiety    Arthritis    Chronic back pain    COPD (chronic obstructive pulmonary disease) (HCC)    Depression    GERD (gastroesophageal reflux disease)    Hypertension    Hypothyroidism    Pneumonia    RECENT X 3   PONV (postoperative nausea and vomiting)    Psoriasis    Thyroid disease     Past Surgical History: Past Surgical History:  Procedure Laterality Date   ABDOMINAL HYSTERECTOMY     heavy bleeding   bladder stimulator     CARPAL TUNNEL RELEASE     CATARACT EXTRACTION W/PHACO Right 03/07/2015   Procedure: CATARACT EXTRACTION PHACO AND  INTRAOCULAR LENS PLACEMENT (IOC);  Surgeon: Sallee Lange, MD;  Location: [facility] ORS;  Service: Ophthalmology;  Laterality: Right;  [name] 01:14 AP% 26.4 CDE 33.04 fluid pack lot # [id number]   CATARACT EXTRACTION W/PHACO Left 03/21/2015   Procedure: CATARACT EXTRACTION PHACO AND INTRAOCULAR LENS PLACEMENT (IOC);  Surgeon: Sallee Lange, MD;  Location: [facility] ORS;  Service: Ophthalmology;  Laterality: Left;  [name] 01:21 AP% 25.6 CDE 39.78 fluid pack lot # [id number]   HAND SURGERY Right    INTERSTIM IMPLANT REMOVAL N/A 09/07/2015   Procedure: REMOVAL OF INTERSTIM IMPLANT;  Surgeon: Alfredo Martinez, MD;  Location: [facility] ORS;  Service: Urology;  Laterality: N/A;   OOPHORECTOMY Right    ovarian cyst    Allergies: Allergies as of 02/04/2023 - Review Complete 02/04/2023  Allergen Reaction Noted   Ciprofloxacin Hives and Diarrhea 06/23/2014   Gabapentin Other (See Comments) 02/04/2023   Mucinex [guaifenesin er] Nausea Only 12/04/2020   Oxycodone Diarrhea 06/23/2014   Oxycodone-acetaminophen Diarrhea 09/16/2014   Oxycodone-acetaminophen Palpitations 08/16/2015   Sulfa antibiotics Rash 07/01/2015    Medications: Outpatient Encounter Medications as of 02/04/2023  Medication Sig   acetaminophen (TYLENOL) 500 MG tablet Take 500 mg by mouth every 6 (six) hours as needed for moderate pain.    albuterol (VENTOLIN HFA) 108 (90 Base) MCG/ACT inhaler INHALE 2  PUFFS BY MOUTH EVERY 6 HOURS AS NEEDED FOR WHEEZING OR SHORTNESS OF BREATH   aspirin EC (ASPIRIN LOW DOSE) 81 MG tablet TAKE 1 TABLET(81 MG) BY MOUTH DAILY. SWALLOW WHOLE   carvedilol (COREG) 3.125 MG tablet TAKE 1 TABLET BY MOUTH TWICE DAILY WITH A MEAL   citalopram (CELEXA) 40 MG tablet TAKE 1 TABLET(40 MG) BY MOUTH DAILY   clonazePAM (KLONOPIN) 1 MG tablet TAKE 1 TABLET BY MOUTH EVERY MORNING, 1/2 TABLET EVERY AFTERNOON AND 1 TABLET EVERY NIGHT AT BEDTIME   diltiazem (CARDIZEM CD) 120 MG 24 hr capsule TAKE 1 CAPSULE BY MOUTH EVERY DAY   doxepin  (SINEQUAN) 25 MG capsule Take 50 mg by mouth at bedtime.   fenofibrate (TRICOR) 145 MG tablet TAKE 1 TABLET(145 MG) BY MOUTH DAILY   fluticasone furoate-vilanterol (BREO ELLIPTA) 100-25 MCG/ACT AEPB Inhale 1 puff into the lungs daily.   hydrOXYzine (ATARAX) 25 MG tablet Take 25 mg by mouth at bedtime.   ipratropium-albuterol (DUONEB) 0.5-2.5 (3) MG/3ML SOLN Take 3 mLs by nebulization every 6 (six) hours as needed (wheezing / shortness of breath).   levothyroxine (SYNTHROID) 100 MCG tablet Take 1 tablet (100 mcg total) by mouth daily.   lisinopril (ZESTRIL) 10 MG tablet TAKE 1 TABLET(10 MG) BY MOUTH DAILY   Misc. Devices (QUAD CANE/SMALL BASE) MISC 1 Device by Does not apply route daily.   pantoprazole (PROTONIX) 40 MG tablet SMARTSIG:1 Tablet(s) By Mouth Every Evening   rosuvastatin (CRESTOR) 20 MG tablet Take 1 tablet (20 mg total) by mouth daily.   Vitamin D, Ergocalciferol, (DRISDOL) 1.25 MG (50000 UNIT) CAPS capsule Take 50,000 Units by mouth 2 (two) times a week.   [DISCONTINUED] clonazePAM (KLONOPIN) 1 MG tablet TAKE 1 TABLET BY MOUTH EVERY MORNING, 1/2 TABLET EVERY AFTERNOON AND 1 TABLET EVERY NIGHT AT BEDTIME   [DISCONTINUED] clonazePAM (KLONOPIN) 1 MG tablet TAKE 1 TABLET BY MOUTH EVERY MORNING, 1/2 TABLET EVERY AFTERNOON AND 1 TABLET EVERY NIGHT AT BEDTIME   [DISCONTINUED] HYDROcodone-acetaminophen (NORCO/VICODIN) 5-325 MG tablet Take 1 tablet by mouth every 6 (six) hours as needed for severe pain (pain score 7-10). (Patient not taking: Reported on 02/04/2023)   [DISCONTINUED] ondansetron (ZOFRAN-ODT) 4 MG disintegrating tablet Take 1 tablet (4 mg total) by mouth every 8 (eight) hours as needed for nausea or vomiting. (Patient not taking: Reported on 02/04/2023)   No facility-administered encounter medications on file as of 02/04/2023.    Social History: Social History   Tobacco Use   Smoking status: Former    Current packs/day: 0.00    Average packs/day: 1 pack/day for 35.0 years  (35.0 ttl pk-yrs)    Types: Cigarettes    Start date: 04/19/2018    Quit date: 11/16/2020    Years since quitting: 2.2   Smokeless tobacco: Never   Tobacco comments:    Previously smoked 2 ppd.   Vaping Use   Vaping status: Never Used  Substance Use Topics   Alcohol use: No   Drug use: No    Family Medical History: Family History  Problem Relation Age of Onset   Heart disease Mother    Breast cancer Mother    Heart attack Mother    COPD Father    Stroke Paternal Uncle    Kidney disease Sister    Breast cancer Cousin    Breast cancer Cousin    Heart attack Maternal Aunt    Stroke Paternal Grandfather    Kidney cancer Neg Hx    Bladder Cancer Neg Hx  Physical Examination: @VITALWITHPAIN @  General: Patient is well developed, well nourished, calm, collected, and in no apparent distress. Attention to examination is appropriate.  Psychiatric: Patient is non-anxious.  Head:  Pupils equal, round, and reactive to light.  ENT:  Oral mucosa appears well hydrated.  Neck:   Supple.    Respiratory: Patient is breathing without any difficulty.  Extremities: No edema.  Vascular: Palpable dorsal pedal pulses.  Skin:   On exposed skin, there are no abnormal skin lesions.  NEUROLOGICAL:     Awake, alert, oriented to person, place, and time.  Speech is clear and fluent. Fund of knowledge is appropriate.   Cranial Nerves: Pupils equal round and reactive to light.  Facial tone is symmetric.    Palpation of spine: lumbar paraspinals non tender.     +SLR of LLE   Side Iliopsoas Quads Hamstring PF DF EHL  R 5 5 5 5 5 5   L 5 5 5  4+ 4 4   Decreased sensation to left lower extremity compared to the right lower extremity, in particular to the foot.  Worse on the dorsal aspect compared to the plantar. 2+ patellar reflexes bilaterally.  1+ right Achilles reflex.  Absent left Achilles.  Hoffman's is absent.  Clonus is not present.  Toes are down-going.   Gait is abnormal.   Patient walks with a rollator. No difficulty with tandem gait.   No evidence of dysmetria noted.  Medical Decision Making  Imaging: MRI Lumbar Spine 01/2023: IMPRESSION: 1. Comparison is made to the prior lumbar spine MRI of 08/19/2006. 2. Spondylosis at the lumbar and visualized lower thoracic levels as outlined within the body of the report. 3. At L4-L5, there has been interval posterior decompression surgery. Progressive disc bulge. New large superimposed left subarticular disc extrusion with caudal migration to the lower L5 vertebral body level. Advanced facet arthropathy. The disc extrusion contributes to severe left subarticular/lateral recess stenosis, encroaching upon descending left-sided nerve roots (most notably the descending left L5 nerve root). Moderate to moderately severe central canal stenosis, progressed. Moderate bilateral neural foraminal narrowing (greater on the left), progressed. 4. At L3-L4, there is progressive multifactorial moderate-to-severe central canal stenosis. Mild bilateral neural foraminal narrowing. 5. At L5-S1, there is multifactorial right subarticular stenosis with potential to affect the descending right S1 nerve root. Moderate central canal stenosis, progressed. Mild bilateral neural foraminal narrowing, progressed. 6. No more than mild spinal canal or neural foraminal narrowing at the remaining levels. 7. Disc degeneration is greatest at L1-L2 (moderate-to-advanced), L2-L3 (moderate) and L3-L4 (moderate). 8. Mild degenerative endplate edema at Z6-X0 and L4-L5. 9. Grade 1 spondylolisthesis at L2-L3, L3-L4 and L5-S1  X-ray lumbar spine 4 view:  No acute fracture noted, but patient with severe degenerative changes of the spine.  No dynamic listhesis noted on flexion and extension.   I have personally reviewed the images and agree with the above interpretation.  Assessment and Plan: Ms. Harbach is a pleasant 72 y.o. female is here today  with a chief complaint of low back pain that goes into the left leg and into the foot x 3 months.  She states after a fall approximately 3 months ago she had new sharp shooting pain extending from her back down her left leg into her foot.  She states the top of her foot is completely numb and she has continued pins-and-needles feeling at the bottom of her foot.  This is associated with weakness and she has had 4 falls within the past month.  She complains in addition of muscle spasms the outside of her left leg.  She has been using Tylenol for pain primarily which she states does very little for her pain.  She rates her pain at a 8/10 which is made worse by walking and activity.  She is unable to get comfortable when laying down and is yet to find a position that is helpful.  She is undergone the rehab and is continuing home health PT. She has had a previous lumbar surgery approximately 5 years ago.  On examination patient has plantarflexion, dorsiflexion, and EHL weakness in the left lower extremity.  Positive straight leg raise.  Normoreflexic with the exception of absent Achilles reflex.  No clonus noted.  Gait is abnormal, patient walks with rollator for support.  MRI shows progressive disc bulge at L4-5 with a new large superimposed left disc extrusion with caudal migration at the level of L5 causing severe left lateral recess stenosis.  Moderate to severe central canal stenosis that has progressed as well as moderate to severe central canal stenosis at L3-4 and bilateral neuroforaminal narrowing and stenosis seen at L5-S1.  No dynamic listhesis was seen on flexion-extension x-rays.  Pleasure to see patient in clinic today.  We discussed her case at length.  Due to patient's weakness despite therapy as well as significant multilevel lumbar stenosis on her MRI and continued numbness and tingling; plan will be for L3-4 laminectomy, L4-5 laminectomy and discectomy on the left side, and L5-S1 laminectomy.  The  risks and benefits related to surgery were discussed at length with the patient and she would like to continue with the procedure.    Thank you for involving me in the care of this patient.   \  Joan Flores, PA-C Dept. of Neurosurgery     Addendum: I saw this patient after they were evaluated by Joan Flores, PA-C.  We discussed her symptoms including significant left lower extremity radiculopathy.  This is causing weakness in her left lower extremity and severe pain.  She has a positive straight leg raise.  She has a decreased Achilles reflex.  Her MRI shows severe stenosis secondary to a disc herniation at L4-5 with caudal migration.  She also has adjacent stenosis at L3-4.  She has had previous lumbar spine surgery and has recurrent stenosis at those levels.  We feel like she would benefit from a lumbar decompression.  The major disc herniation is at L4-5 but with its caudal migration will likely need to approach it both from L4-5 and below where she has had her previous surgery.  Given her significant stenosis above we will plan for an L3-S1 laminectomy with a discectomy at L4-5.  Risks and benefits were discussed with the patient.  She like to go forward with surgery.  Lovenia Kim, MD [facility] in [place]

## 2023-02-01 NOTE — Telephone Encounter (Signed)
Spoke with Lawson Fiscal, Patient is to continue medications.  Verbal understanding/order.

## 2023-02-03 ENCOUNTER — Other Ambulatory Visit: Payer: Self-pay | Admitting: Internal Medicine

## 2023-02-03 DIAGNOSIS — I10 Essential (primary) hypertension: Secondary | ICD-10-CM

## 2023-02-04 ENCOUNTER — Other Ambulatory Visit: Payer: Self-pay

## 2023-02-04 ENCOUNTER — Ambulatory Visit
Admission: RE | Admit: 2023-02-04 | Discharge: 2023-02-04 | Disposition: A | Discharge status: Home / Self Care | Payer: PPO | Source: Ambulatory Visit | Attending: Physician Assistant | Admitting: Physician Assistant

## 2023-02-04 ENCOUNTER — Ambulatory Visit: Payer: PPO | Admitting: Physician Assistant

## 2023-02-04 ENCOUNTER — Ambulatory Visit
Admission: RE | Admit: 2023-02-04 | Discharge: 2023-02-04 | Disposition: A | Discharge status: Home / Self Care | Payer: PPO | Attending: Physician Assistant | Admitting: Physician Assistant

## 2023-02-04 VITALS — BP 124/78 | Ht 63.0 in | Wt 194.0 lb

## 2023-02-04 DIAGNOSIS — W19XXXA Unspecified fall, initial encounter: Secondary | ICD-10-CM

## 2023-02-04 DIAGNOSIS — R29898 Other symptoms and signs involving the musculoskeletal system: Secondary | ICD-10-CM | POA: Diagnosis not present

## 2023-02-04 DIAGNOSIS — Z01818 Encounter for other preprocedural examination: Secondary | ICD-10-CM

## 2023-02-04 DIAGNOSIS — R202 Paresthesia of skin: Secondary | ICD-10-CM

## 2023-02-04 DIAGNOSIS — M5106 Intervertebral disc disorders with myelopathy, lumbar region: Secondary | ICD-10-CM

## 2023-02-04 DIAGNOSIS — M5126 Other intervertebral disc displacement, lumbar region: Secondary | ICD-10-CM

## 2023-02-04 DIAGNOSIS — R200 Anesthesia of skin: Secondary | ICD-10-CM | POA: Insufficient documentation

## 2023-02-04 DIAGNOSIS — M48061 Spinal stenosis, lumbar region without neurogenic claudication: Secondary | ICD-10-CM | POA: Insufficient documentation

## 2023-02-04 DIAGNOSIS — M48062 Spinal stenosis, lumbar region with neurogenic claudication: Secondary | ICD-10-CM | POA: Diagnosis not present

## 2023-02-04 DIAGNOSIS — R2681 Unsteadiness on feet: Secondary | ICD-10-CM | POA: Diagnosis not present

## 2023-02-04 NOTE — Patient Instructions (Addendum)
Please see below for information in regards to your upcoming surgery:   Planned surgery: L3-4 laminectomy, L4-5 laminectomy and discectomy of left side, L5-S1 laminectomy   Surgery date: 02/28/23 at [facility] (Medical Mall: [address], [place], [place] [address]) - you will find out your arrival time the business day before your surgery.   Pre-op appointment at [facility] Pre-admit Testing: we will call you with a date/time for this. If you are scheduled for an in person appointment, Pre-admit Testing is located on the first floor of the [facility] building, [address], [address]. Please bring all prescriptions in the original prescription bottles to your appointment. During this appointment, they will advise you which medications you can take the morning of surgery, and which medications you will need to hold for surgery. Labs (such as blood work, EKG) may be done at your pre-op appointment. You are not required to fast for these labs. Should you need to change your pre-op appointment, please call Pre-admit testing at [phone].     Blood thinners:   Aspirin:   stop aspirin 7 days prior, resume aspirin 14 days after     Surgical clearance: we will send a clearance form to Nicki Reaper, NP. They may wish to see you in their office prior to signing the clearance form. If so, they may call you to schedule an appointment.      Common restrictions after surgery: No bending, lifting, or twisting ("BLT"). Avoid lifting objects heavier than 10 pounds for the first 6 weeks after surgery. Where possible, avoid household activities that involve lifting, bending, reaching, pushing, or pulling such as laundry, vacuuming, grocery shopping, and childcare. Try to arrange for help from friends and family for these activities while you heal. Do not drive while taking prescription pain medication. Weeks 6 through 12 after surgery: avoid lifting more than 25  pounds.     How to contact us:  If you have any questions/concerns before or after surgery, you can reach [name] at [phone], or you can send a mychart message. We can be reached by phone or mychart 8am-4pm, Monday-Friday.  *Please note: Calls after 4pm are forwarded to a third party answering service. Mychart messages are not routinely monitored during evenings, weekends, and holidays. Please call our office to contact the answering service for urgent concerns during non-business hours.    Appointments/FMLA & disability paperwork: Joycelyn Rua, & Flonnie Hailstone Registered Nurse/Surgery scheduler: Royston Cowper Medical Assistants: Nash Mantis Physician Assistants: Joan Flores, PA-C, Manning Charity, PA-C & Drake Leach, PA-C Surgeons: Venetia Night, MD & Ernestine Mcmurray, MD

## 2023-02-05 NOTE — Telephone Encounter (Signed)
 Requested Prescriptions  Pending Prescriptions Disp Refills   rosuvastatin  (CRESTOR ) 10 MG tablet [Pharmacy Med Name: ROSUVASTATIN  10MG  TABLETS] 90 tablet 1    Sig: TAKE 1 TABLET(10 MG) BY MOUTH DAILY     Cardiovascular:  Antilipid - Statins 2 Failed - 02/05/2023  4:35 PM      Failed - Cr in normal range and within 360 days    Creat  Date Value Ref Range Status  08/28/2022 1.02 (H) 0.60 - 1.00 mg/dL Final   Creatinine, Ser  Date Value Ref Range Status  01/10/2023 1.18 (H) 0.44 - 1.00 mg/dL Final   Creatinine, Urine  Date Value Ref Range Status  03/05/2022 76 20 - 275 mg/dL Final         Failed - Lipid Panel in normal range within the last 12 months    Cholesterol, Total  Date Value Ref Range Status  02/03/2015 205 (H) 100 - 199 mg/dL Final   Cholesterol  Date Value Ref Range Status  08/28/2022 117 <200 mg/dL Final   LDL Cholesterol (Calc)  Date Value Ref Range Status  08/28/2022 48 mg/dL (calc) Final    Comment:    Reference range: <100 . Desirable range <100 mg/dL for primary prevention;   <70 mg/dL for patients with CHD or diabetic patients  with > or = 2 CHD risk factors. SABRA LDL-C is now calculated using the Martin-Hopkins  calculation, which is a validated novel method providing  better accuracy than the Friedewald equation in the  estimation of LDL-C.  Gladis APPLETHWAITE et al. SANDREA. 7986;689(80): 2061-2068  (http://education.[employer].com/faq/FAQ164)    HDL  Date Value Ref Range Status  08/28/2022 42 (L) > OR = 50 mg/dL Final  87/70/7983 36 (L) >39 mg/dL Final   Triglycerides  Date Value Ref Range Status  08/28/2022 197 (H) <150 mg/dL Final         Passed - Patient is not pregnant      Passed - Valid encounter within last 12 months    Recent Outpatient Visits           5 months ago Encounter for general adult medical examination with abnormal findings   [facility] [place], Angeline ORN, NP   9 months ago COPD exacerbation  [facility])   [place] [facility] [place], Angeline ORN, NP   11 months ago Mixed hyperlipidemia   [place] [facility] [place], Angeline ORN, NP   1 year ago Acquired hypothyroidism   [place] [facility] [place], Angeline ORN, NP   1 year ago Screening for colon cancer   [place] [facility] [place], Angeline ORN, NP       Future Appointments             In 3 weeks [place], Angeline ORN, NP [place] [facility]             citalopram  (CELEXA ) 40 MG tablet [Pharmacy Med Name: CITALOPRAM  40MG  TABLETS] 90 tablet 0    Sig: TAKE 1 TABLET(40 MG) BY MOUTH DAILY     Psychiatry:  Antidepressants - SSRI Passed - 02/05/2023  4:35 PM      Passed - Completed PHQ-2 or PHQ-9 in the last 360 days      Passed - Valid encounter within last 6 months    Recent Outpatient Visits           5 months ago Encounter for general adult medical  examination with abnormal findings   [place] [facility] [place], Angeline ORN, NP   9 months ago COPD exacerbation [facility])   [place] [facility] Antonette Angeline ORN, NP   11 months ago Mixed hyperlipidemia   [place] [facility] [place], Angeline ORN, NP   1 year ago Acquired hypothyroidism   [place] [facility] [place], Angeline ORN, NP   1 year ago Screening for colon cancer   [place] [facility] [place], Angeline ORN, NP       Future Appointments             In 3 weeks Antonette, Angeline ORN, NP  [facility], [facility]

## 2023-02-07 NOTE — Telephone Encounter (Signed)
 Cr. In date.  Requested Prescriptions  Pending Prescriptions Disp Refills   lisinopril  (ZESTRIL ) 10 MG tablet [Pharmacy Med Name: LISINOPRIL  10MG  TABLETS] 90 tablet 0    Sig: TAKE 1 TABLET(10 MG) BY MOUTH DAILY     Cardiovascular:  ACE Inhibitors Failed - 02/07/2023  1:30 PM      Failed - Cr in normal range and within 180 days    Creat  Date Value Ref Range Status  08/28/2022 1.02 (H) 0.60 - 1.00 mg/dL Final   Creatinine, Ser  Date Value Ref Range Status  01/10/2023 1.18 (H) 0.44 - 1.00 mg/dL Final   Creatinine, Urine  Date Value Ref Range Status  03/05/2022 76 20 - 275 mg/dL Final         Passed - K in normal range and within 180 days    Potassium  Date Value Ref Range Status  01/10/2023 4.0 3.5 - 5.1 mmol/L Final  02/02/2013 3.7 3.5 - 5.1 mmol/L Final         Passed - Patient is not pregnant      Passed - Last BP in normal range    BP Readings from Last 1 Encounters:  02/04/23 124/78         Passed - Valid encounter within last 6 months    Recent Outpatient Visits           5 months ago Encounter for general adult medical examination with abnormal findings   [facility] [place], Angeline ORN, NP   9 months ago COPD exacerbation [facility])   [facility] [place], Angeline ORN, NP   11 months ago Mixed hyperlipidemia   [place] [facility] [place], [place] W, NP   1 year ago Acquired hypothyroidism   [place] [facility] [place], [place] W, NP   1 year ago Screening for colon cancer   [place] [facility] [place], Angeline ORN, NP       Future Appointments             In 3 weeks Antonette, Angeline ORN, NP [place] [facility]             carvedilol  (COREG ) 3.125 MG tablet [Pharmacy Med Name: CARVEDILOL  3.125MG  TABLETS] 180 tablet 0    Sig: TAKE 1 TABLET BY MOUTH TWICE DAILY WITH A MEAL     Cardiovascular: Beta Blockers 3 Failed - 02/07/2023  1:30 PM       Failed - Cr in normal range and within 360 days    Creat  Date Value Ref Range Status  08/28/2022 1.02 (H) 0.60 - 1.00 mg/dL Final   Creatinine, Ser  Date Value Ref Range Status  01/10/2023 1.18 (H) 0.44 - 1.00 mg/dL Final   Creatinine, Urine  Date Value Ref Range Status  03/05/2022 76 20 - 275 mg/dL Final         Passed - AST in normal range and within 360 days    AST  Date Value Ref Range Status  01/10/2023 21 15 - 41 U/L Final         Passed - ALT in normal range and within 360 days    ALT  Date Value Ref Range Status  01/10/2023 22 0 - 44 U/L Final         Passed - Last BP in normal range    BP Readings from Last 1 Encounters:  02/04/23 124/78  Passed - Last Heart Rate in normal range    Pulse Readings from Last 1 Encounters:  01/10/23 76         Passed - Valid encounter within last 6 months    Recent Outpatient Visits           5 months ago Encounter for general adult medical examination with abnormal findings   [place] [facility] [place], Angeline ORN, NP   9 months ago COPD exacerbation [facility])   [place] [facility] [place], Angeline ORN, NP   11 months ago Mixed hyperlipidemia   [facility] [place], [place] W, NP   1 year ago Acquired hypothyroidism   [facility] [place], [place], NP   1 year ago Screening for colon cancer   [place] [facility] [place], Angeline ORN, NP       Future Appointments             In 3 weeks Antonette, Angeline ORN, NP [place] [facility]             diltiazem  (CARDIZEM  CD) 120 MG 24 hr capsule [Pharmacy Med Name: DILTIAZEM  CD 120MG  CAPSULES (24 HR)] 90 capsule 0    Sig: TAKE 1 CAPSULE BY MOUTH EVERY DAY     Cardiovascular: Calcium  Channel Blockers 3 Failed - 02/07/2023  1:30 PM      Failed - Cr in normal range and within 360 days    Creat  Date Value Ref Range Status  08/28/2022 1.02 (H) 0.60 -  1.00 mg/dL Final   Creatinine, Ser  Date Value Ref Range Status  01/10/2023 1.18 (H) 0.44 - 1.00 mg/dL Final   Creatinine, Urine  Date Value Ref Range Status  03/05/2022 76 20 - 275 mg/dL Final         Passed - ALT in normal range and within 360 days    ALT  Date Value Ref Range Status  01/10/2023 22 0 - 44 U/L Final         Passed - AST in normal range and within 360 days    AST  Date Value Ref Range Status  01/10/2023 21 15 - 41 U/L Final         Passed - Last BP in normal range    BP Readings from Last 1 Encounters:  02/04/23 124/78         Passed - Last Heart Rate in normal range    Pulse Readings from Last 1 Encounters:  01/10/23 76         Passed - Valid encounter within last 6 months    Recent Outpatient Visits           5 months ago Encounter for general adult medical examination with abnormal findings   [place] [facility] [place], Angeline ORN, NP   9 months ago COPD exacerbation [facility])   [place] [facility] [place], Angeline ORN, NP   11 months ago Mixed hyperlipidemia   [facility] [place], Angeline ORN, NP   1 year ago Acquired hypothyroidism   [facility] [place], Angeline ORN, NP   1 year ago Screening for colon cancer    [facility] [place], Angeline ORN, NP       Future Appointments  In 3 weeks Baity, Angeline ORN, NP [place] [facility]

## 2023-02-08 ENCOUNTER — Ambulatory Visit (INDEPENDENT_AMBULATORY_CARE_PROVIDER_SITE_OTHER): Payer: PPO | Admitting: Internal Medicine

## 2023-02-08 ENCOUNTER — Encounter: Payer: Self-pay | Admitting: Internal Medicine

## 2023-02-08 ENCOUNTER — Other Ambulatory Visit: Payer: Self-pay

## 2023-02-08 VITALS — BP 110/68 | Ht 63.0 in | Wt 202.0 lb

## 2023-02-08 DIAGNOSIS — E119 Type 2 diabetes mellitus without complications: Secondary | ICD-10-CM | POA: Diagnosis not present

## 2023-02-08 DIAGNOSIS — M48062 Spinal stenosis, lumbar region with neurogenic claudication: Secondary | ICD-10-CM

## 2023-02-08 DIAGNOSIS — Z23 Encounter for immunization: Secondary | ICD-10-CM

## 2023-02-08 DIAGNOSIS — J432 Centrilobular emphysema: Secondary | ICD-10-CM | POA: Diagnosis not present

## 2023-02-08 DIAGNOSIS — Z01818 Encounter for other preprocedural examination: Secondary | ICD-10-CM

## 2023-02-08 LAB — POCT GLYCOSYLATED HEMOGLOBIN (HGB A1C): Hemoglobin A1C: 6.4 % — AB (ref 4.0–5.6)

## 2023-02-08 MED ORDER — FENOFIBRATE 145 MG PO TABS: 145.0000 mg | ORAL_TABLET | Freq: Every day | ORAL | 0 refills | Status: DC

## 2023-02-08 NOTE — Telephone Encounter (Signed)
 Copied from CRM [id number]. Topic: General - Other >> Feb 08, 2023  2:27 PM Rosaria BRAVO wrote: Reason for CRM: Lauren RN from Neurosurgery in [place] called to confirm whether fax was received for medical clearance. It was sent on 02/05/2023. Please advise   Best contact: [phone]

## 2023-02-08 NOTE — Progress Notes (Signed)
 Subjective:    Patient ID: [name], female    DOB: 03-30-1950, 73 y.o.   MRN: [id number]  HPI  Patient presents to clinic today for preoperative screening.  She plans to have a L3-4 laminectomy, L4-5 laminectomy and discectomy of left side, L5-S1 laminectomy under general anesthesia for spinal stenosis by Dr. Claudene at [facility] health neurosurgery.  She has a history of COPD, HTN, hypothyroidism and DM2.   Review of Systems     Past Medical History:  Diagnosis Date   Anxiety    Arthritis    Chronic back pain    COPD (chronic obstructive pulmonary disease) (HCC)    Depression    GERD (gastroesophageal reflux disease)    Hypertension    Hypothyroidism    Pneumonia    RECENT X 3   PONV (postoperative nausea and vomiting)    Psoriasis    Thyroid  disease     Current Outpatient Medications  Medication Sig Dispense Refill   acetaminophen  (TYLENOL ) 500 MG tablet Take 500 mg by mouth every 6 (six) hours as needed for moderate pain.      albuterol  (VENTOLIN  HFA) 108 (90 Base) MCG/ACT inhaler INHALE 2 PUFFS BY MOUTH EVERY 6 HOURS AS NEEDED FOR WHEEZING OR SHORTNESS OF BREATH 8.5 g 2   aspirin  EC (ASPIRIN  LOW DOSE) 81 MG tablet TAKE 1 TABLET(81 MG) BY MOUTH DAILY. SWALLOW WHOLE 90 tablet 1   carvedilol  (COREG ) 3.125 MG tablet TAKE 1 TABLET BY MOUTH TWICE DAILY WITH A MEAL 180 tablet 0   citalopram  (CELEXA ) 40 MG tablet TAKE 1 TABLET(40 MG) BY MOUTH DAILY 90 tablet 0   clonazePAM  (KLONOPIN ) 1 MG tablet TAKE 1 TABLET BY MOUTH EVERY MORNING, 1/2 TABLET EVERY AFTERNOON AND 1 TABLET EVERY NIGHT AT BEDTIME 75 tablet 0   diltiazem  (CARDIZEM  CD) 120 MG 24 hr capsule TAKE 1 CAPSULE BY MOUTH EVERY DAY 90 capsule 0   doxepin  (SINEQUAN ) 25 MG capsule Take 50 mg by mouth at bedtime.     fenofibrate  (TRICOR ) 145 MG tablet TAKE 1 TABLET(145 MG) BY MOUTH DAILY 90 tablet 0   fluticasone  furoate-vilanterol (BREO ELLIPTA ) 100-25 MCG/ACT AEPB Inhale 1 puff into the lungs daily. 1 each 0   hydrOXYzine   (ATARAX ) 25 MG tablet Take 25 mg by mouth at bedtime.     ipratropium-albuterol  (DUONEB) 0.5-2.5 (3) MG/3ML SOLN Take 3 mLs by nebulization every 6 (six) hours as needed (wheezing / shortness of breath). 60 mL 0   levothyroxine  (SYNTHROID ) 100 MCG tablet Take 1 tablet (100 mcg total) by mouth daily. 90 tablet 1   lisinopril  (ZESTRIL ) 10 MG tablet TAKE 1 TABLET(10 MG) BY MOUTH DAILY 90 tablet 0   Misc. Devices (QUAD CANE/SMALL BASE) MISC 1 Device by Does not apply route daily. 1 each 0   pantoprazole  (PROTONIX ) 40 MG tablet SMARTSIG:1 Tablet(s) By Mouth Every Evening     rosuvastatin  (CRESTOR ) 20 MG tablet Take 1 tablet (20 mg total) by mouth daily. 90 tablet 3   Vitamin D , Ergocalciferol , (DRISDOL ) 1.25 MG (50000 UNIT) CAPS capsule Take 50,000 Units by mouth 2 (two) times a week.     No current facility-administered medications for this visit.    Allergies  Allergen Reactions   Ciprofloxacin Hives and Diarrhea   Gabapentin  Other (See Comments)    hallucination   Mucinex  [Guaifenesin  Er] Nausea Only    Per pt   Oxycodone  Diarrhea   Oxycodone -Acetaminophen  Diarrhea   Oxycodone -Acetaminophen  Palpitations   Sulfa Antibiotics Rash  Throat, mouth Throat, mouth    Family History  Problem Relation Age of Onset   Heart disease Mother    Breast cancer Mother    Heart attack Mother    COPD Father    Stroke Paternal Uncle    Kidney disease Sister    Breast cancer Cousin    Breast cancer Cousin    Heart attack Maternal Aunt    Stroke Paternal Grandfather    Kidney cancer Neg Hx    Bladder Cancer Neg Hx     Social History   Socioeconomic History   Marital status: Widowed    Spouse name: Not on file   Number of children: Not on file   Years of education: Not on file   Highest education level: Not on file  Occupational History   Occupation: retired  Tobacco Use   Smoking status: Former    Current packs/day: 0.00    Average packs/day: 1 pack/day for 35.0 years (35.0 ttl  pk-yrs)    Types: Cigarettes    Start date: 04/19/2018    Quit date: 11/16/2020    Years since quitting: 2.2   Smokeless tobacco: Never   Tobacco comments:    Previously smoked 2 ppd.   Vaping Use   Vaping status: Never Used  Substance and Sexual Activity   Alcohol  use: No   Drug use: No   Sexual activity: Not Currently    Birth control/protection: Surgical  Other Topics Concern   Not on file  Social History Narrative   Not on file   Social Drivers of Health   Financial Resource Strain: Low Risk  (04/27/2022)   Overall Financial Resource Strain (CARDIA)    Difficulty of Paying Living Expenses: Not hard at all  Food Insecurity: No Food Insecurity (01/04/2023)   Hunger Vital Sign    Worried About Running Out of Food in the Last Year: Never true    Ran Out of Food in the Last Year: Never true  Transportation Needs: No Transportation Needs (01/04/2023)   PRAPARE - Administrator, Civil Service (Medical): No    Lack of Transportation (Non-Medical): No  Physical Activity: Inactive (04/27/2022)   Exercise Vital Sign    Days of Exercise per Week: 0 days    Minutes of Exercise per Session: 0 min  Stress: No Stress Concern Present (04/27/2022)   [employer] of Occupational Health - Occupational Stress Questionnaire    Feeling of Stress : Only a little  Social Connections: Moderately Isolated (04/27/2022)   Social Connection and Isolation Panel [NHANES]    Frequency of Communication with Friends and Family: More than three times a week    Frequency of Social Gatherings with Friends and Family: Three times a week    Attends Religious Services: More than 4 times per year    Active Member of Clubs or Organizations: No    Attends Banker Meetings: Never    Marital Status: Widowed  Intimate Partner Violence: Not At Risk (01/04/2023)   Humiliation, Afraid, Rape, and Kick questionnaire    Fear of Current or Ex-Partner: No    Emotionally Abused: No     Physically Abused: No    Sexually Abused: No     Constitutional: Denies fever, malaise, fatigue, headache or abrupt weight changes.  Respiratory: Denies difficulty breathing, shortness of breath, cough or sputum production.   Cardiovascular: Denies chest pain, chest tightness, palpitations or swelling in the hands or feet.  Gastrointestinal: Denies abdominal pain, bloating, constipation,  diarrhea or blood in the stool.  GU: Denies urgency, frequency, dysuria, burning sensation, blood in urine, odor or discharge. Musculoskeletal: Patient reports joint pain, right side low back pain, difficulty with gait.  Denies decrease in range of motion, muscle pain or joint swelling.  Skin: Denies redness, rashes, lesions or ulcercations.  Neurological: Patient reports insomnia, paresthesia of left foot, difficulty with balance.  Denies dizziness, difficulty with memory, difficulty with speech or problems with coordination.  Psych: Patient has a history of anxiety and depression.  Denies SI/HI.  No other specific complaints in a complete review of systems (except as listed in HPI above).  Objective:   Physical Exam BP 110/68 (BP Location: Left Arm, Patient Position: Sitting, Cuff Size: Large)   Ht 5' 3 (1.6 m)   Wt 202 lb (91.6 kg)   BMI 35.78 kg/m    Wt Readings from Last 3 Encounters:  02/04/23 194 lb (88 kg)  01/10/23 199 lb 15.3 oz (90.7 kg)  01/02/23 199 lb 15.3 oz (90.7 kg)    General: Appears her stated age, obese, in NAD. Cardiovascular: Normal rate and rhythm. S1,S2 noted.  No murmur, rubs or gallops noted. No JVD or BLE edema. Pulmonary/Chest: Normal effort and positive vesicular breath sounds. No respiratory distress. No wheezes, rales or ronchi noted.   Musculoskeletal: Decreased flexion, extension, rotation and lateral bending of the spine. Pain with palpation over the lumbar spine and bilateral SI joints. Strength 3/5 RLE, 2/5 LLE. She is able to stand on tiptoes but not heels.  She has difficulty getting from a sitting to a standing position. Gait slow and steady with use of rolling walker.  Neurological: Alert and oriented.     BMET    Component Value Date/Time   NA 136 01/10/2023 0108   NA 142 06/24/2015 1102   NA 135 (L) 02/02/2013 1021   K 4.0 01/10/2023 0108   K 3.7 02/02/2013 1021   CL 96 (L) 01/10/2023 0108   CL 103 02/02/2013 1021   CO2 29 01/10/2023 0108   CO2 30 02/02/2013 1021   GLUCOSE 173 (H) 01/10/2023 0108   GLUCOSE 152 (H) 02/02/2013 1021   BUN 31 (H) 01/10/2023 0108   BUN 12 06/24/2015 1102   BUN 8 02/02/2013 1021   CREATININE 1.18 (H) 01/10/2023 0108   CREATININE 1.02 (H) 08/28/2022 1445   CALCIUM  8.5 (L) 01/10/2023 0108   CALCIUM  9.9 02/02/2013 1021   GFRNONAA 49 (L) 01/10/2023 0108   GFRNONAA 72 12/03/2019 0947   GFRAA 84 12/03/2019 0947    Lipid Panel     Component Value Date/Time   CHOL 117 08/28/2022 1445   CHOL 205 (H) 02/03/2015 1022   TRIG 197 (H) 08/28/2022 1445   HDL 42 (L) 08/28/2022 1445   HDL 36 (L) 02/03/2015 1022   CHOLHDL 2.8 08/28/2022 1445   LDLCALC 48 08/28/2022 1445    CBC    Component Value Date/Time   WBC 11.0 (H) 01/10/2023 0108   RBC 4.88 01/10/2023 0108   HGB 14.4 01/10/2023 0108   HGB 15.6 06/24/2015 1102   HCT 46.1 (H) 01/10/2023 0108   HCT 46.6 06/24/2015 1102   PLT 349 01/10/2023 0108   PLT 307 06/24/2015 1102   MCV 94.5 01/10/2023 0108   MCV 93 06/24/2015 1102   MCV 90 02/02/2013 1021   MCH 29.5 01/10/2023 0108   MCHC 31.2 01/10/2023 0108   RDW 13.3 01/10/2023 0108   RDW 13.3 06/24/2015 1102   RDW 13.2 02/02/2013 1021  LYMPHSABS 0.6 (L) 01/10/2023 0108   LYMPHSABS 2.3 06/24/2015 1102   MONOABS 0.6 01/10/2023 0108   EOSABS 0.5 01/10/2023 0108   EOSABS 0.3 06/24/2015 1102   BASOSABS 0.0 01/10/2023 0108   BASOSABS 0.1 06/24/2015 1102    Hgb A1C Lab Results  Component Value Date   HGBA1C 6.9 (H) 08/28/2022           Assessment & Plan:   Preoperative screening for  lumbar spinal stenosis:  Recent ecg reviewed Will check CBC, CMET, A1C and PT/INR Once labs are reviewed, will fax form back to neurosurgeons office  RTC in 1 month for follow-up of chronic conditions Angeline Laura, NP

## 2023-02-08 NOTE — Patient Instructions (Signed)
Spinal Stenosis  Spinal stenosis happens when the spinal canal gets smaller. The spinal canal is the space between the bones of your spine (vertebrae). As the canal gets smaller, the nerves that pass that part of the spine are pressed. This causes pain, weakness, or loss of feeling (numbness) in your arms or legs. Spinal stenosis can affect your neck, upper back, or lower back. What are the causes? This condition is caused by parts of bone that push into your spinal canal. This problem may be present at birth. If it occurs after birth, the cause may be: Breakdown of bones of your spine. This normally starts between 16 and 80 years of age. Injury to your spine. Any surgeries you have had to your spine. Tumors in your spine. A buildup of calcium in your spine. What increases the risk? You are more likely to develop this condition if: You are older than age 88. You were born with a problem in your spine, such as a curved spine (scoliosis). You have arthritis. This is disease of your joints. What are the signs or symptoms? Common symptoms of this condition include: Pain in your neck or back. The pain may be worse when you stand or walk. Problems with your legs or arms. A leg or arm may lose feeling, tingle, or turn hot or cold. This can happen in one arm or leg, or both. Pain that goes from your butt down to your lower leg (sciatica). This can happen in one or both legs. Falling a lot. Foot drop. This is when you have trouble lifting the front part of your foot and it drags on the ground when you walk. Severe symptoms of this condition include: Problems pooping (having a bowel movement) or peeing (urinating). Trouble having sex. Loss of feeling in your leg. Being unable to walk. Symptoms may come on slowly and get worse over time. Sometimes there are no symptoms. How is this treated? To treat pain and manage symptoms, you may be asked to: Practice sitting and standing up straight. This is  good posture. Do exercises. Lose weight, if needed. Take medicines or get shots. Wear a corset or a brace. This supports your back. In some cases, you may need to have surgery. Follow these instructions at home: Managing pain, stiffness, and swelling  Stand and sit up straight. If you have a brace or a corset, wear it as told by your doctor. Keep a healthy weight. Talk with your doctor if you need help losing weight. If told, put heat on the affected area. Do this as often as told by your doctor. Use the heat source that your doctor recommends, such as a moist heat pack or a heating pad. Place a towel between your skin and the heat source. Leave the heat on for 20-30 minutes. If your skin turns bright red, take off the heat right away to prevent burns. The risk of burns is higher if you cannot feel pain, heat, or cold. Activity Do exercises as told by your doctor. Do not do anything that causes pain. Ask your doctor what activities are safe for you. You may have to avoid lifting. Ask your doctor how much you can lift. Return to your normal activities when your doctor says that it is safe. General instructions Take over-the-counter and prescription medicines only as told by your doctor. Do not smoke or use any products that contain nicotine or tobacco. If you need help quitting, ask your doctor. Eat a healthy diet. Eat  a lot of: Fruits. Vegetables. Whole grains. Low-fat (lean) protein. Where to find more information [employer] of Arthritis and Musculoskeletal and Skin Diseases: www.niams.http://www.myers.net/ Contact a doctor if: Your symptoms do not get better. Your symptoms get worse. You have a fever. Get help right away if: You have new pain or very bad pain in your neck or upper back. You have very bad pain, and medicine does not help. You have a very bad headache. You are dizzy. You do not see well. You vomit or feel like you may vomit. You have these things in your back or  legs: New or worse loss of feeling. New or worse tingling. You cannot control when you poop or pee. Your arm or leg: Hurts or swells. Turns red. Feels warm. These symptoms may be an emergency. Get help right away. Call 911. Do not wait to see if the symptoms will go away. Do not drive yourself to the hospital. Summary Spinal stenosis happens when the space between the bones of the spine gets smaller. This condition may be present at birth. Spinal stenosis can cause pain in the neck, back, legs, or butt. Treatment for this condition focuses on lessening your pain and other symptoms. This information is not intended to replace advice given to you by your health care provider. Make sure you discuss any questions you have with your health care provider. Document Revised: 05/01/2021 Document Reviewed: 04/18/2021 Elsevier Patient Education  2024 [employer].

## 2023-02-14 ENCOUNTER — Other Ambulatory Visit: Payer: Self-pay

## 2023-02-14 ENCOUNTER — Encounter
Admission: RE | Admit: 2023-02-14 | Discharge: 2023-02-14 | Disposition: A | Discharge status: Home / Self Care | Payer: PPO | Source: Ambulatory Visit | Attending: Neurosurgery | Admitting: Neurosurgery

## 2023-02-14 VITALS — BP 159/65 | HR 82 | Temp 97.7°F | Resp 18

## 2023-02-14 DIAGNOSIS — M48062 Spinal stenosis, lumbar region with neurogenic claudication: Secondary | ICD-10-CM

## 2023-02-14 DIAGNOSIS — M48061 Spinal stenosis, lumbar region without neurogenic claudication: Secondary | ICD-10-CM | POA: Insufficient documentation

## 2023-02-14 DIAGNOSIS — Z01812 Encounter for preprocedural laboratory examination: Secondary | ICD-10-CM | POA: Diagnosis present

## 2023-02-14 DIAGNOSIS — Z01818 Encounter for other preprocedural examination: Secondary | ICD-10-CM

## 2023-02-14 HISTORY — DX: Myoneural disorder, unspecified: G70.9

## 2023-02-14 LAB — URINALYSIS, COMPLETE (UACMP) WITH MICROSCOPIC
Bilirubin Urine: NEGATIVE
Glucose, UA: NEGATIVE mg/dL
Hgb urine dipstick: NEGATIVE
Ketones, ur: NEGATIVE mg/dL
Nitrite: NEGATIVE
Protein, ur: NEGATIVE mg/dL
Specific Gravity, Urine: 1.012 (ref 1.005–1.030)
pH: 6 (ref 5.0–8.0)

## 2023-02-14 LAB — TYPE AND SCREEN
ABO/RH(D): O POS
Antibody Screen: NEGATIVE

## 2023-02-14 LAB — SURGICAL PCR SCREEN
MRSA, PCR: NEGATIVE
Staphylococcus aureus: NEGATIVE

## 2023-02-14 NOTE — Patient Instructions (Addendum)
 Your procedure is scheduled on: Jan 23 /2025  Report to the Registration Desk on the 1st floor of the [facility]. To find out your arrival time, please call [phone] between 1PM - 3PM on: Jan 22/ 2025  If your arrival time is 6:00 am, do not arrive before that time as the [facility] entrance doors do not open until 6:00 am.  REMEMBER: Instructions that are not followed completely may result in serious medical risk, up to and including death; or upon the discretion of your surgeon and anesthesiologist your surgery may need to be rescheduled.  Do not eat food after midnight the night before surgery.  No gum chewing or hard candies.  You may however, drink CLEAR liquids up to 2 hours before you are scheduled to arrive for your surgery. Do not drink anything within 2 hours of your scheduled arrival time.  Clear liquids include: - water   - apple juice without pulp - gatorade (not RED colors) - black coffee or tea (Do NOT add milk or creamers to the coffee or tea) Do NOT drink anything that is not on this list.      **Follow recommendations regarding stopping blood thinners.**  Aspirin :   stop aspirin  7 days prior to surgery.    Last dose will be Feb 20, 2023 per your Doctor.    Continue taking all of your other prescription medications up until the day of surgery.  ON THE DAY OF SURGERY ONLY TAKE THESE MEDICATIONS WITH SIPS OF WATER :  levothyroxine  (SYNTHROID ) pantoprazole  (PROTONIX ) rosuvastatin  (CRESTOR ) fenofibrate  (TRICOR  diltiazem  (CARDIZEM  CD) carvedilol  (COREG )  Use  fluticasone  furoate-vilanterol (BREO ELLIPTA ) at home  on the day of surgery as prescribed and bring  albuterol  to the hospital.   No Alcohol  for 24 hours before or after surgery.  No Smoking including e-cigarettes for 24 hours before surgery.  No chewable tobacco products for at least 6 hours before surgery.  No nicotine patches on the day of surgery.  Do not use any recreational drugs for  at least a week (preferably 2 weeks) before your surgery.  Please be advised that the combination of cocaine and anesthesia may have negative outcomes, up to and including death. If you test positive for cocaine, your surgery will be cancelled.  On the morning of surgery brush your teeth with toothpaste and water , you may rinse your mouth with mouthwash if you wish. Do not swallow any toothpaste or mouthwash.  Use CHG Soap or wipes as directed on instruction sheet.- provided for you   Do not wear jewelry, make-up, hairpins, clips or nail polish.  For welded (permanent) jewelry: bracelets, anklets, waist bands, etc.  Please have this removed prior to surgery.  If it is not removed, there is a chance that hospital personnel will need to cut it off on the day of surgery.  Do not wear lotions, powders, or perfumes.   Do not shave body hair from the neck down 48 hours before surgery.  Contact lenses, hearing aids and dentures may not be worn into surgery.  Do not bring valuables to the hospital. [facility] is not responsible for any missing/lost belongings or valuables.      Notify your doctor if there is any change in your medical condition (cold, fever, infection).  Wear comfortable clothing (specific to your surgery type) to the hospital.  After surgery, you can help prevent lung complications by doing breathing exercises.   If you are being discharged the day of surgery,  you will not be allowed to drive home. You will need a responsible individual to drive you home and stay with you for 24 hours after surgery.    Please call the Pre-admissions Testing Dept. at [phone] if you have any questions about these instructions.  Surgery Visitation Policy:  Patients having surgery or a procedure may have two visitors.  Children under the age of 61 must have an adult with them who is not the patient.  Inpatient Visitation:    Visiting hours are 7 a.m. to 8 p.m. Up to four  visitors are allowed at one time in a patient room. The visitors may rotate out with other people during the day.  One visitor age 28 or older may stay with the patient overnight and must be in the room by 8 p.m.     Pre-operative 5 CHG Bath Instructions   You can play a key role in reducing the risk of infection after surgery. Your skin needs to be as free of germs as possible. You can reduce the number of germs on your skin by washing with CHG (chlorhexidine  gluconate) soap before surgery. CHG is an antiseptic soap that kills germs and continues to kill germs even after washing.   DO NOT use if you have an allergy to chlorhexidine /CHG or antibacterial soaps. If your skin becomes reddened or irritated, stop using the CHG and notify one of our RNs at [phone].   Please shower with the CHG soap starting 4 days before surgery using the following schedule:     Please keep in mind the following:  DO NOT shave, including legs and underarms, starting the day of your first shower.   You may shave your face at any point before/day of surgery.  Place clean sheets on your bed the day you start using CHG soap. Use a clean washcloth (not used since being washed) for each shower. DO NOT sleep with pets once you start using the CHG.   CHG Shower Instructions:  If you choose to wash your hair and private area, wash first with your normal shampoo/soap.  After you use shampoo/soap, rinse your hair and body thoroughly to remove shampoo/soap residue.  Turn the water  OFF and apply about 3 tablespoons (45 ml) of CHG soap to a CLEAN washcloth.  Apply CHG soap ONLY FROM YOUR NECK DOWN TO YOUR TOES (washing for 3-5 minutes)  DO NOT use CHG soap on face, private areas, open wounds, or sores.  Pay special attention to the area where your surgery is being performed.  If you are having back surgery, having someone wash your back for you may be helpful. Wait 2 minutes after CHG soap is applied, then you may  rinse off the CHG soap.  Pat dry with a clean towel  Put on clean clothes/pajamas   If you choose to wear lotion, please use ONLY the CHG-compatible lotions on the back of this paper.     Additional instructions for the day of surgery: DO NOT APPLY any lotions, deodorants, cologne, or perfumes.   Put on clean/comfortable clothes.  Brush your teeth.  Ask your nurse before applying any prescription medications to the skin.      CHG Compatible Lotions   Aveeno Moisturizing lotion  Cetaphil Moisturizing Cream  Cetaphil Moisturizing Lotion  Clairol Herbal Essence Moisturizing Lotion, Dry Skin  Clairol Herbal Essence Moisturizing Lotion, Extra Dry Skin  Clairol Herbal Essence Moisturizing Lotion, Normal Skin  Curel Age Defying Therapeutic Moisturizing Lotion with Alpha  Hydroxy  Curel Extreme Care Body Lotion  Curel Soothing Hands Moisturizing Hand Lotion  Curel Therapeutic Moisturizing Cream, Fragrance-Free  Curel Therapeutic Moisturizing Lotion, Fragrance-Free  Curel Therapeutic Moisturizing Lotion, Original Formula  Eucerin Daily Replenishing Lotion  Eucerin Dry Skin Therapy Plus Alpha Hydroxy Crme  Eucerin Dry Skin Therapy Plus Alpha Hydroxy Lotion  Eucerin Original Crme  Eucerin Original Lotion  Eucerin Plus Crme Eucerin Plus Lotion  Eucerin TriLipid Replenishing Lotion  Keri Anti-Bacterial Hand Lotion  Keri Deep Conditioning Original Lotion Dry Skin Formula Softly Scented  Keri Deep Conditioning Original Lotion, Fragrance Free Sensitive Skin Formula  Keri Lotion Fast Absorbing Fragrance Free Sensitive Skin Formula  Keri Lotion Fast Absorbing Softly Scented Dry Skin Formula  Keri Original Lotion  Keri Skin Renewal Lotion Keri Silky Smooth Lotion  Keri Silky Smooth Sensitive Skin Lotion  Nivea Body Creamy Conditioning Oil  Nivea Body Extra Enriched Teacher, Adult Education Moisturizing Lotion Nivea Crme  Nivea Skin Firming Lotion   NutraDerm 30 Skin Lotion  NutraDerm Skin Lotion  NutraDerm Therapeutic Skin Cream  NutraDerm Therapeutic Skin Lotion  ProShield Protective Hand Cream  Provon moisturizing lotion

## 2023-02-27 ENCOUNTER — Telehealth: Payer: Self-pay | Admitting: Internal Medicine

## 2023-02-27 NOTE — Anesthesia Preprocedure Evaluation (Signed)
Anesthesia Evaluation  Patient identified by MRN, date of birth, ID band Patient awake    Reviewed: Allergy & Precautions, H&P , NPO status , Patient's Chart, lab work & pertinent test results, reviewed documented beta blocker date and time   History of Anesthesia Complications (+) PONV and history of anesthetic complications  Airway Mallampati: II  TM Distance: >3 FB Neck ROM: full    Dental no notable dental hx.    Pulmonary COPD, Patient abstained from smoking., former smoker PNA discharged from hospital early December. Pt has home oxygen but does not use at home.    Pulmonary exam normal        Cardiovascular Exercise Tolerance: Poor hypertension, Pt. on home beta blockers and Pt. on medications + Peripheral Vascular Disease  Normal cardiovascular exam     Neuro/Psych  PSYCHIATRIC DISORDERS Anxiety     negative neurological ROS     GI/Hepatic Neg liver ROS,GERD  ,,  Endo/Other  diabetes, Well Controlled, Type 2Hypothyroidism    Renal/GU      Musculoskeletal   Abdominal  (+) + obese  Peds  Hematology negative hematology ROS (+)   Anesthesia Other Findings Past Medical History: No date: Anxiety No date: Arthritis No date: Chronic back pain No date: COPD (chronic obstructive pulmonary disease) (HCC) No date: Depression No date: GERD (gastroesophageal reflux disease) No date: Hypertension No date: Hypothyroidism No date: Neuromuscular disorder (HCC) No date: Pneumonia     Comment:  RECENT X 3 No date: PONV (postoperative nausea and vomiting) No date: Psoriasis No date: Thyroid disease  Past Surgical History: No date: ABDOMINAL HYSTERECTOMY     Comment:  heavy bleeding No date: bladder stimulator No date: CARPAL TUNNEL RELEASE 03/07/2015: CATARACT EXTRACTION W/PHACO; Right     Comment:  Procedure: CATARACT EXTRACTION PHACO AND INTRAOCULAR               LENS PLACEMENT (IOC);  Surgeon: Sallee Lange, MD;                Location: [facility] ORS;  Service: Ophthalmology;  Laterality:              Right;  [name] 01:14 AP% 26.4 CDE 33.04 fluid pack lot #               [id number] 03/21/2015: CATARACT EXTRACTION W/PHACO; Left     Comment:  Procedure: CATARACT EXTRACTION PHACO AND INTRAOCULAR               LENS PLACEMENT (IOC);  Surgeon: Sallee Lange, MD;                Location: [facility] ORS;  Service: Ophthalmology;  Laterality:              Left;  [name] 01:21 AP% 25.6 CDE 39.78 fluid pack lot #               [id number] No date: HAND SURGERY; Right 09/07/2015: INTERSTIM IMPLANT REMOVAL; N/A     Comment:  Procedure: REMOVAL OF INTERSTIM IMPLANT;  Surgeon: Alfredo Martinez, MD;  Location: [facility] ORS;  Service: Urology;                Laterality: N/A; No date: OOPHORECTOMY; Right     Comment:  ovarian cyst  BMI    Body Mass Index: 35.89 kg/m      Reproductive/Obstetrics negative OB ROS  Anesthesia Physical Anesthesia Plan  ASA: 3  Anesthesia Plan: General ETT   Post-op Pain Management:    Induction:   PONV Risk Score and Plan: 2 and Ondansetron, Dexamethasone and Midazolam  Airway Management Planned:   Additional Equipment:   Intra-op Plan:   Post-operative Plan: Extubation in OR  Informed Consent: I have reviewed the patients History and Physical, chart, labs and discussed the procedure including the risks, benefits and alternatives for the proposed anesthesia with the patient or authorized representative who has indicated his/her understanding and acceptance.     Dental Advisory Given  Plan Discussed with: CRNA and Surgeon  Anesthesia Plan Comments:         Anesthesia Quick Evaluation

## 2023-02-27 NOTE — Telephone Encounter (Signed)
Spoke with [name], explained to her the only thing we have in patients chart is a review only no signature needed. And if she could refax what is needed. She then proceeded to state that it has been refaxed today. I looked through faxes for after lunch it was not in the system. Asked her to refax what is needed. Confirmed fax number.

## 2023-02-27 NOTE — Telephone Encounter (Signed)
[name] with [facility] is calling in because she faxed over an order for a PT assessment. Order number: [id number] but she hasn't heard anything back and says it's now a month out of Medicare's compliance. [name] is requesting someone reach out to her regarding this fax.

## 2023-02-28 ENCOUNTER — Encounter
Admission: RE | Disposition: A | Discharge status: Home Health | Payer: Self-pay | Source: Home / Self Care | Attending: Neurosurgery

## 2023-02-28 ENCOUNTER — Ambulatory Visit: Payer: PPO | Admitting: Internal Medicine

## 2023-02-28 ENCOUNTER — Other Ambulatory Visit: Payer: Self-pay

## 2023-02-28 ENCOUNTER — Inpatient Hospital Stay: Payer: Self-pay | Admitting: Anesthesiology

## 2023-02-28 ENCOUNTER — Inpatient Hospital Stay: Payer: PPO

## 2023-02-28 ENCOUNTER — Inpatient Hospital Stay
Admission: RE | Admit: 2023-02-28 | Discharge: 2023-03-08 | DRG: 519 | Disposition: A | Discharge status: Home Health | Payer: PPO | Attending: Neurosurgery | Admitting: Neurosurgery

## 2023-02-28 ENCOUNTER — Encounter: Payer: Self-pay | Admitting: Neurosurgery

## 2023-02-28 ENCOUNTER — Inpatient Hospital Stay: Payer: PPO | Admitting: Urgent Care

## 2023-02-28 DIAGNOSIS — Z9181 History of falling: Secondary | ICD-10-CM | POA: Diagnosis not present

## 2023-02-28 DIAGNOSIS — Z885 Allergy status to narcotic agent status: Secondary | ICD-10-CM

## 2023-02-28 DIAGNOSIS — Z79899 Other long term (current) drug therapy: Secondary | ICD-10-CM | POA: Diagnosis not present

## 2023-02-28 DIAGNOSIS — M5106 Intervertebral disc disorders with myelopathy, lumbar region: Secondary | ICD-10-CM | POA: Diagnosis present

## 2023-02-28 DIAGNOSIS — M5416 Radiculopathy, lumbar region: Principal | ICD-10-CM | POA: Diagnosis present

## 2023-02-28 DIAGNOSIS — I959 Hypotension, unspecified: Secondary | ICD-10-CM | POA: Diagnosis not present

## 2023-02-28 DIAGNOSIS — Z841 Family history of disorders of kidney and ureter: Secondary | ICD-10-CM

## 2023-02-28 DIAGNOSIS — M5116 Intervertebral disc disorders with radiculopathy, lumbar region: Secondary | ICD-10-CM | POA: Diagnosis not present

## 2023-02-28 DIAGNOSIS — E039 Hypothyroidism, unspecified: Secondary | ICD-10-CM | POA: Diagnosis present

## 2023-02-28 DIAGNOSIS — Z7982 Long term (current) use of aspirin: Secondary | ICD-10-CM | POA: Diagnosis not present

## 2023-02-28 DIAGNOSIS — J449 Chronic obstructive pulmonary disease, unspecified: Secondary | ICD-10-CM | POA: Diagnosis present

## 2023-02-28 DIAGNOSIS — Z961 Presence of intraocular lens: Secondary | ICD-10-CM | POA: Diagnosis present

## 2023-02-28 DIAGNOSIS — Z888 Allergy status to other drugs, medicaments and biological substances status: Secondary | ICD-10-CM | POA: Diagnosis not present

## 2023-02-28 DIAGNOSIS — Z803 Family history of malignant neoplasm of breast: Secondary | ICD-10-CM

## 2023-02-28 DIAGNOSIS — Z881 Allergy status to other antibiotic agents status: Secondary | ICD-10-CM | POA: Diagnosis not present

## 2023-02-28 DIAGNOSIS — Z87891 Personal history of nicotine dependence: Secondary | ICD-10-CM

## 2023-02-28 DIAGNOSIS — R29898 Other symptoms and signs involving the musculoskeletal system: Secondary | ICD-10-CM

## 2023-02-28 DIAGNOSIS — M48061 Spinal stenosis, lumbar region without neurogenic claudication: Secondary | ICD-10-CM | POA: Diagnosis not present

## 2023-02-28 DIAGNOSIS — Z882 Allergy status to sulfonamides status: Secondary | ICD-10-CM | POA: Diagnosis not present

## 2023-02-28 DIAGNOSIS — Z90721 Acquired absence of ovaries, unilateral: Secondary | ICD-10-CM | POA: Diagnosis not present

## 2023-02-28 DIAGNOSIS — Z825 Family history of asthma and other chronic lower respiratory diseases: Secondary | ICD-10-CM

## 2023-02-28 DIAGNOSIS — Z8249 Family history of ischemic heart disease and other diseases of the circulatory system: Secondary | ICD-10-CM | POA: Diagnosis not present

## 2023-02-28 DIAGNOSIS — Z823 Family history of stroke: Secondary | ICD-10-CM

## 2023-02-28 DIAGNOSIS — M48062 Spinal stenosis, lumbar region with neurogenic claudication: Secondary | ICD-10-CM | POA: Diagnosis present

## 2023-02-28 DIAGNOSIS — Z9689 Presence of other specified functional implants: Secondary | ICD-10-CM | POA: Diagnosis not present

## 2023-02-28 DIAGNOSIS — I10 Essential (primary) hypertension: Secondary | ICD-10-CM | POA: Diagnosis present

## 2023-02-28 DIAGNOSIS — Z9842 Cataract extraction status, left eye: Secondary | ICD-10-CM

## 2023-02-28 DIAGNOSIS — M4726 Other spondylosis with radiculopathy, lumbar region: Secondary | ICD-10-CM | POA: Diagnosis not present

## 2023-02-28 DIAGNOSIS — Z90710 Acquired absence of both cervix and uterus: Secondary | ICD-10-CM

## 2023-02-28 DIAGNOSIS — Z01818 Encounter for other preprocedural examination: Secondary | ICD-10-CM

## 2023-02-28 DIAGNOSIS — F410 Panic disorder [episodic paroxysmal anxiety] without agoraphobia: Secondary | ICD-10-CM

## 2023-02-28 DIAGNOSIS — Z9841 Cataract extraction status, right eye: Secondary | ICD-10-CM | POA: Diagnosis not present

## 2023-02-28 HISTORY — PX: LUMBAR LAMINECTOMY/DECOMPRESSION MICRODISCECTOMY: SHX5026

## 2023-02-28 LAB — CBC
HCT: 37.1 % (ref 36.0–46.0)
Hemoglobin: 11.3 g/dL — ABNORMAL LOW (ref 12.0–15.0)
MCH: 29.6 pg (ref 26.0–34.0)
MCHC: 30.5 g/dL (ref 30.0–36.0)
MCV: 97.1 fL (ref 80.0–100.0)
Platelets: 220 10*3/uL (ref 150–400)
RBC: 3.82 MIL/uL — ABNORMAL LOW (ref 3.87–5.11)
RDW: 13.4 % (ref 11.5–15.5)
WBC: 10.7 10*3/uL — ABNORMAL HIGH (ref 4.0–10.5)
nRBC: 0 % (ref 0.0–0.2)

## 2023-02-28 LAB — CREATININE, SERUM
Creatinine, Ser: 1.21 mg/dL — ABNORMAL HIGH (ref 0.44–1.00)
GFR, Estimated: 47 mL/min — ABNORMAL LOW (ref >60)

## 2023-02-28 LAB — ABO/RH: ABO/RH(D): O POS

## 2023-02-28 SURGERY — LUMBAR LAMINECTOMY/DECOMPRESSION MICRODISCECTOMY 3 LEVELS
Anesthesia: General

## 2023-02-28 MED ORDER — FENTANYL CITRATE (PF) 100 MCG/2ML IJ SOLN
INTRAMUSCULAR | Status: AC
  Filled 2023-02-28: qty 2

## 2023-02-28 MED ORDER — METHYLPREDNISOLONE ACETATE 40 MG/ML IJ SUSP
INTRAMUSCULAR | Status: DC | PRN
  Administered 2023-02-28: 40 mg

## 2023-02-28 MED ORDER — CITALOPRAM HYDROBROMIDE 20 MG PO TABS
40.0000 mg | ORAL_TABLET | Freq: Every day | ORAL | Status: DC
  Administered 2023-03-01 – 2023-03-08 (×8): 40 mg via ORAL
  Filled 2023-02-28: qty 4
  Filled 2023-02-28: qty 2
  Filled 2023-02-28: qty 4
  Filled 2023-02-28 (×7): qty 2
  Filled 2023-02-28: qty 4
  Filled 2023-02-28: qty 2
  Filled 2023-02-28 (×3): qty 4

## 2023-02-28 MED ORDER — ACETAMINOPHEN 325 MG PO TABS
650.0000 mg | ORAL_TABLET | ORAL | Status: DC | PRN
  Administered 2023-03-01 – 2023-03-04 (×6): 650 mg via ORAL
  Filled 2023-02-28 (×7): qty 2

## 2023-02-28 MED ORDER — MIDAZOLAM HCL 2 MG/2ML IJ SOLN
INTRAMUSCULAR | Status: AC
  Filled 2023-02-28: qty 2

## 2023-02-28 MED ORDER — ONDANSETRON HCL 4 MG/2ML IJ SOLN
INTRAMUSCULAR | Status: DC | PRN
  Administered 2023-02-28: 4 mg via INTRAVENOUS

## 2023-02-28 MED ORDER — DEXAMETHASONE SODIUM PHOSPHATE 10 MG/ML IJ SOLN
INTRAMUSCULAR | Status: AC
  Filled 2023-02-28: qty 1

## 2023-02-28 MED ORDER — HYDROMORPHONE HCL 2 MG PO TABS
2.0000 mg | ORAL_TABLET | ORAL | Status: DC | PRN
  Filled 2023-02-28: qty 1

## 2023-02-28 MED ORDER — SENNOSIDES-DOCUSATE SODIUM 8.6-50 MG PO TABS: 1.0000 | ORAL_TABLET | Freq: Every evening | ORAL | Status: DC | PRN

## 2023-02-28 MED ORDER — BUPIVACAINE-EPINEPHRINE (PF) 0.5% -1:200000 IJ SOLN
INTRAMUSCULAR | Status: DC | PRN
  Administered 2023-02-28: 10 mL

## 2023-02-28 MED ORDER — SODIUM CHLORIDE (PF) 0.9 % IJ SOLN
INTRAMUSCULAR | Status: DC | PRN
  Administered 2023-02-28: 60 mL via INTRAMUSCULAR

## 2023-02-28 MED ORDER — SENNA 8.6 MG PO TABS
1.0000 | ORAL_TABLET | Freq: Two times a day (BID) | ORAL | Status: DC
  Administered 2023-02-28 – 2023-03-08 (×10): 8.6 mg via ORAL
  Filled 2023-02-28 (×15): qty 1

## 2023-02-28 MED ORDER — MIDAZOLAM HCL 2 MG/2ML IJ SOLN
INTRAMUSCULAR | Status: DC | PRN
  Administered 2023-02-28: 2 mg via INTRAVENOUS

## 2023-02-28 MED ORDER — ONDANSETRON HCL 4 MG PO TABS: 4.0000 mg | ORAL_TABLET | Freq: Four times a day (QID) | ORAL | Status: DC | PRN

## 2023-02-28 MED ORDER — VITAMIN D (ERGOCALCIFEROL) 1.25 MG (50000 UNIT) PO CAPS
50000.0000 [IU] | ORAL_CAPSULE | ORAL | Status: DC
  Administered 2023-03-04 – 2023-03-07 (×2): 50000 [IU] via ORAL
  Filled 2023-02-28 (×3): qty 1

## 2023-02-28 MED ORDER — SODIUM CHLORIDE FLUSH 0.9 % IV SOLN
INTRAVENOUS | Status: AC
  Filled 2023-02-28: qty 20

## 2023-02-28 MED ORDER — BUPIVACAINE-EPINEPHRINE (PF) 0.5% -1:200000 IJ SOLN
INTRAMUSCULAR | Status: AC
  Filled 2023-02-28: qty 10

## 2023-02-28 MED ORDER — FENTANYL CITRATE (PF) 100 MCG/2ML IJ SOLN
INTRAMUSCULAR | Status: DC | PRN
  Administered 2023-02-28: 25 ug via INTRAVENOUS
  Administered 2023-02-28: 75 ug via INTRAVENOUS

## 2023-02-28 MED ORDER — METHYLPREDNISOLONE ACETATE 40 MG/ML IJ SUSP
INTRAMUSCULAR | Status: AC
  Filled 2023-02-28: qty 1

## 2023-02-28 MED ORDER — CEFAZOLIN SODIUM-DEXTROSE 2-3 GM-%(50ML) IV SOLR
INTRAVENOUS | Status: DC | PRN
  Administered 2023-02-28: 2 g via INTRAVENOUS

## 2023-02-28 MED ORDER — TRAMADOL HCL 50 MG PO TABS
50.0000 mg | ORAL_TABLET | Freq: Once | ORAL | Status: AC | PRN
  Administered 2023-02-28: 50 mg via ORAL

## 2023-02-28 MED ORDER — CHLORHEXIDINE GLUCONATE 0.12 % MT SOLN
15.0000 mL | Freq: Once | OROMUCOSAL | Status: AC
  Administered 2023-02-28: 15 mL via OROMUCOSAL

## 2023-02-28 MED ORDER — BUPIVACAINE HCL (PF) 0.5 % IJ SOLN
INTRAMUSCULAR | Status: AC
  Filled 2023-02-28: qty 30

## 2023-02-28 MED ORDER — SODIUM CHLORIDE 0.9% FLUSH
3.0000 mL | Freq: Two times a day (BID) | INTRAVENOUS | Status: DC
  Administered 2023-02-28 – 2023-03-08 (×15): 3 mL via INTRAVENOUS

## 2023-02-28 MED ORDER — DROPERIDOL 2.5 MG/ML IJ SOLN
0.6250 mg | Freq: Once | INTRAMUSCULAR | Status: DC | PRN
Start: 2023-02-28 — End: 2023-02-28

## 2023-02-28 MED ORDER — KETAMINE HCL 50 MG/5ML IJ SOSY
PREFILLED_SYRINGE | INTRAMUSCULAR | Status: AC
  Filled 2023-02-28: qty 5

## 2023-02-28 MED ORDER — LIDOCAINE HCL (PF) 2 % IJ SOLN
INTRAMUSCULAR | Status: AC
  Filled 2023-02-28: qty 5

## 2023-02-28 MED ORDER — ROCURONIUM BROMIDE 100 MG/10ML IV SOLN
INTRAVENOUS | Status: DC | PRN
  Administered 2023-02-28: 40 mg via INTRAVENOUS

## 2023-02-28 MED ORDER — MENTHOL 3 MG MT LOZG: 1.0000 | LOZENGE | OROMUCOSAL | Status: DC | PRN

## 2023-02-28 MED ORDER — CEFAZOLIN IN SODIUM CHLORIDE 2-0.9 GM/100ML-% IV SOLN: 2.0000 g | Freq: Once | INTRAVENOUS | Status: DC

## 2023-02-28 MED ORDER — LIDOCAINE HCL (CARDIAC) PF 100 MG/5ML IV SOSY
PREFILLED_SYRINGE | INTRAVENOUS | Status: DC | PRN
  Administered 2023-02-28: 100 mg via INTRAVENOUS

## 2023-02-28 MED ORDER — ENOXAPARIN SODIUM 30 MG/0.3ML IJ SOSY
30.0000 mg | PREFILLED_SYRINGE | INTRAMUSCULAR | Status: DC
  Administered 2023-03-01 – 2023-03-07 (×7): 30 mg via SUBCUTANEOUS
  Filled 2023-02-28 (×7): qty 0.3

## 2023-02-28 MED ORDER — ACETAMINOPHEN 500 MG PO TABS
1000.0000 mg | ORAL_TABLET | Freq: Four times a day (QID) | ORAL | Status: AC
  Administered 2023-02-28 – 2023-03-01 (×3): 1000 mg via ORAL
  Filled 2023-02-28 (×3): qty 2

## 2023-02-28 MED ORDER — ORAL CARE MOUTH RINSE: 15.0000 mL | Freq: Once | OROMUCOSAL | Status: AC

## 2023-02-28 MED ORDER — GLYCOPYRROLATE 0.2 MG/ML IJ SOLN
INTRAMUSCULAR | Status: AC
  Filled 2023-02-28: qty 1

## 2023-02-28 MED ORDER — DEXAMETHASONE SODIUM PHOSPHATE 10 MG/ML IJ SOLN
INTRAMUSCULAR | Status: DC | PRN
  Administered 2023-02-28: 8 mg via INTRAVENOUS

## 2023-02-28 MED ORDER — ONDANSETRON HCL 4 MG/2ML IJ SOLN
INTRAMUSCULAR | Status: AC
  Filled 2023-02-28: qty 2

## 2023-02-28 MED ORDER — ACETAMINOPHEN 10 MG/ML IV SOLN: 1000.0000 mg | Freq: Once | INTRAVENOUS | Status: DC | PRN

## 2023-02-28 MED ORDER — 0.9 % SODIUM CHLORIDE (POUR BTL) OPTIME
TOPICAL | Status: DC | PRN
  Administered 2023-02-28: 500 mL

## 2023-02-28 MED ORDER — HYDROMORPHONE HCL 1 MG/ML IJ SOLN
1.0000 mg | INTRAMUSCULAR | Status: DC | PRN
  Administered 2023-02-28 – 2023-03-07 (×4): 1 mg via INTRAVENOUS
  Filled 2023-02-28 (×4): qty 1

## 2023-02-28 MED ORDER — CLONAZEPAM 1 MG PO TABS
1.0000 mg | ORAL_TABLET | Freq: Three times a day (TID) | ORAL | Status: DC | PRN
  Administered 2023-03-01 – 2023-03-08 (×8): 1 mg via ORAL
  Filled 2023-02-28 (×8): qty 1

## 2023-02-28 MED ORDER — PROPOFOL 10 MG/ML IV BOLUS
INTRAVENOUS | Status: DC | PRN
  Administered 2023-02-28: 80 mg via INTRAVENOUS

## 2023-02-28 MED ORDER — SEVOFLURANE IN SOLN
RESPIRATORY_TRACT | Status: AC
  Filled 2023-02-28: qty 250

## 2023-02-28 MED ORDER — BISACODYL 5 MG PO TBEC
5.0000 mg | DELAYED_RELEASE_TABLET | Freq: Every day | ORAL | Status: DC | PRN
  Administered 2023-03-04 – 2023-03-05 (×2): 5 mg via ORAL
  Filled 2023-02-28 (×2): qty 1

## 2023-02-28 MED ORDER — SODIUM CHLORIDE 0.9% FLUSH: 3.0000 mL | INTRAVENOUS | Status: DC | PRN

## 2023-02-28 MED ORDER — GLYCOPYRROLATE 0.2 MG/ML IJ SOLN
INTRAMUSCULAR | Status: DC | PRN
  Administered 2023-02-28: .2 mg via INTRAVENOUS

## 2023-02-28 MED ORDER — ACETAMINOPHEN 650 MG RE SUPP: 650.0000 mg | RECTAL | Status: DC | PRN

## 2023-02-28 MED ORDER — BUPIVACAINE LIPOSOME 1.3 % IJ SUSP
INTRAMUSCULAR | Status: AC
  Filled 2023-02-28: qty 20

## 2023-02-28 MED ORDER — ACETAMINOPHEN 500 MG PO TABS
500.0000 mg | ORAL_TABLET | Freq: Four times a day (QID) | ORAL | Status: DC | PRN
  Administered 2023-03-05: 500 mg via ORAL
  Filled 2023-02-28: qty 1

## 2023-02-28 MED ORDER — IPRATROPIUM-ALBUTEROL 0.5-2.5 (3) MG/3ML IN SOLN: 3.0000 mL | Freq: Four times a day (QID) | RESPIRATORY_TRACT | Status: DC | PRN

## 2023-02-28 MED ORDER — ROSUVASTATIN CALCIUM 10 MG PO TABS
20.0000 mg | ORAL_TABLET | Freq: Every day | ORAL | Status: DC
  Administered 2023-03-01 – 2023-03-08 (×8): 20 mg via ORAL
  Filled 2023-02-28 (×9): qty 2

## 2023-02-28 MED ORDER — LISINOPRIL 5 MG PO TABS
10.0000 mg | ORAL_TABLET | Freq: Every day | ORAL | Status: DC
  Administered 2023-03-01 – 2023-03-08 (×5): 10 mg via ORAL
  Filled 2023-02-28 (×8): qty 2

## 2023-02-28 MED ORDER — DOXEPIN HCL 50 MG PO CAPS
50.0000 mg | ORAL_CAPSULE | Freq: Every day | ORAL | Status: DC
  Administered 2023-02-28 – 2023-03-07 (×8): 50 mg via ORAL
  Filled 2023-02-28 (×10): qty 1

## 2023-02-28 MED ORDER — CHLORHEXIDINE GLUCONATE 0.12 % MT SOLN
OROMUCOSAL | Status: AC
  Filled 2023-02-28: qty 15

## 2023-02-28 MED ORDER — TRAMADOL HCL 50 MG PO TABS
ORAL_TABLET | ORAL | Status: AC
  Filled 2023-02-28: qty 1

## 2023-02-28 MED ORDER — LEVOTHYROXINE SODIUM 100 MCG PO TABS
100.0000 ug | ORAL_TABLET | Freq: Every day | ORAL | Status: DC
  Administered 2023-03-01 – 2023-03-08 (×8): 100 ug via ORAL
  Filled 2023-02-28: qty 1
  Filled 2023-02-28: qty 2
  Filled 2023-02-28 (×3): qty 1
  Filled 2023-02-28: qty 2
  Filled 2023-02-28: qty 1
  Filled 2023-02-28 (×4): qty 2
  Filled 2023-02-28 (×3): qty 1
  Filled 2023-02-28: qty 2

## 2023-02-28 MED ORDER — PROPOFOL 10 MG/ML IV BOLUS
INTRAVENOUS | Status: AC
  Filled 2023-02-28: qty 20

## 2023-02-28 MED ORDER — ALBUTEROL SULFATE (2.5 MG/3ML) 0.083% IN NEBU: 3.0000 mL | INHALATION_SOLUTION | Freq: Four times a day (QID) | RESPIRATORY_TRACT | Status: DC | PRN

## 2023-02-28 MED ORDER — CYCLOBENZAPRINE HCL 10 MG PO TABS
10.0000 mg | ORAL_TABLET | Freq: Three times a day (TID) | ORAL | Status: DC | PRN
  Administered 2023-03-02 – 2023-03-08 (×9): 10 mg via ORAL
  Filled 2023-02-28 (×9): qty 1

## 2023-02-28 MED ORDER — HYDROXYZINE HCL 25 MG PO TABS
25.0000 mg | ORAL_TABLET | Freq: Every day | ORAL | Status: DC
  Administered 2023-03-01 – 2023-03-07 (×7): 25 mg via ORAL
  Filled 2023-02-28 (×9): qty 1

## 2023-02-28 MED ORDER — MAGNESIUM CITRATE PO SOLN
1.0000 | Freq: Once | ORAL | Status: AC | PRN
  Administered 2023-03-05: 1 via ORAL
  Filled 2023-02-28: qty 296

## 2023-02-28 MED ORDER — ROCURONIUM BROMIDE 10 MG/ML (PF) SYRINGE
PREFILLED_SYRINGE | INTRAVENOUS | Status: AC
  Filled 2023-02-28: qty 10

## 2023-02-28 MED ORDER — PANTOPRAZOLE SODIUM 40 MG PO TBEC
40.0000 mg | DELAYED_RELEASE_TABLET | Freq: Every day | ORAL | Status: DC
  Administered 2023-03-01 – 2023-03-08 (×8): 40 mg via ORAL
  Filled 2023-02-28 (×8): qty 1

## 2023-02-28 MED ORDER — SODIUM CHLORIDE 0.9 % IV SOLN
250.0000 mL | INTRAVENOUS | Status: AC
  Administered 2023-02-28: 250 mL via INTRAVENOUS

## 2023-02-28 MED ORDER — SUGAMMADEX SODIUM 200 MG/2ML IV SOLN
INTRAVENOUS | Status: DC | PRN
  Administered 2023-02-28: 200 mg via INTRAVENOUS

## 2023-02-28 MED ORDER — ONDANSETRON HCL 4 MG/2ML IJ SOLN: 4.0000 mg | Freq: Four times a day (QID) | INTRAMUSCULAR | Status: DC | PRN

## 2023-02-28 MED ORDER — DILTIAZEM HCL ER COATED BEADS 120 MG PO CP24
120.0000 mg | ORAL_CAPSULE | Freq: Every day | ORAL | Status: DC
  Administered 2023-03-01 – 2023-03-08 (×7): 120 mg via ORAL
  Filled 2023-02-28 (×8): qty 1

## 2023-02-28 MED ORDER — CEFAZOLIN SODIUM-DEXTROSE 2-4 GM/100ML-% IV SOLN
2.0000 g | Freq: Four times a day (QID) | INTRAVENOUS | Status: AC
  Administered 2023-02-28 – 2023-03-01 (×2): 2 g via INTRAVENOUS
  Filled 2023-02-28 (×2): qty 100

## 2023-02-28 MED ORDER — DOCUSATE SODIUM 100 MG PO CAPS
100.0000 mg | ORAL_CAPSULE | Freq: Two times a day (BID) | ORAL | Status: DC
  Administered 2023-02-28 – 2023-03-08 (×10): 100 mg via ORAL
  Filled 2023-02-28 (×15): qty 1

## 2023-02-28 MED ORDER — PHENOL 1.4 % MT LIQD
1.0000 | OROMUCOSAL | Status: DC | PRN
  Filled 2023-02-28: qty 177

## 2023-02-28 MED ORDER — KETAMINE HCL 50 MG/5ML IJ SOSY
PREFILLED_SYRINGE | INTRAMUSCULAR | Status: DC | PRN
  Administered 2023-02-28: 10 mg via INTRAVENOUS

## 2023-02-28 MED ORDER — FENOFIBRATE 160 MG PO TABS
160.0000 mg | ORAL_TABLET | Freq: Every day | ORAL | Status: DC
  Administered 2023-03-01 – 2023-03-08 (×8): 160 mg via ORAL
  Filled 2023-02-28 (×8): qty 1

## 2023-02-28 MED ORDER — LACTATED RINGERS IV SOLN: INTRAVENOUS | Status: DC

## 2023-02-28 MED ORDER — FLUTICASONE FUROATE-VILANTEROL 100-25 MCG/ACT IN AEPB
1.0000 | INHALATION_SPRAY | Freq: Every day | RESPIRATORY_TRACT | Status: DC
  Administered 2023-03-01 – 2023-03-08 (×8): 1 via RESPIRATORY_TRACT
  Filled 2023-02-28: qty 28

## 2023-02-28 MED ORDER — CARVEDILOL 3.125 MG PO TABS
3.1250 mg | ORAL_TABLET | Freq: Two times a day (BID) | ORAL | Status: DC
  Administered 2023-03-01 – 2023-03-08 (×13): 3.125 mg via ORAL
  Filled 2023-02-28 (×15): qty 1

## 2023-02-28 MED ORDER — TRIAMCINOLONE ACETONIDE 0.1 % EX OINT
1.0000 | TOPICAL_OINTMENT | Freq: Two times a day (BID) | CUTANEOUS | Status: DC
  Administered 2023-02-28 – 2023-03-08 (×13): 1 via TOPICAL
  Filled 2023-02-28 (×3): qty 15

## 2023-02-28 MED ORDER — PHENYLEPHRINE 80 MCG/ML (10ML) SYRINGE FOR IV PUSH (FOR BLOOD PRESSURE SUPPORT)
PREFILLED_SYRINGE | INTRAVENOUS | Status: DC | PRN
  Administered 2023-02-28: 80 ug via INTRAVENOUS
  Administered 2023-02-28 (×3): 100 ug via INTRAVENOUS
  Administered 2023-02-28: 200 ug via INTRAVENOUS

## 2023-02-28 MED ORDER — FENTANYL CITRATE (PF) 100 MCG/2ML IJ SOLN
25.0000 ug | INTRAMUSCULAR | Status: DC | PRN
  Administered 2023-02-28 (×4): 25 ug via INTRAVENOUS

## 2023-02-28 MED ORDER — CEFAZOLIN SODIUM-DEXTROSE 2-4 GM/100ML-% IV SOLN
INTRAVENOUS | Status: AC
  Filled 2023-02-28: qty 100

## 2023-02-28 MED ORDER — CLOBETASOL PROPIONATE 0.05 % EX CREA: 1.0000 | TOPICAL_CREAM | CUTANEOUS | Status: DC | PRN

## 2023-02-28 MED ORDER — ALUM & MAG HYDROXIDE-SIMETH 200-200-20 MG/5ML PO SUSP: 30.0000 mL | Freq: Four times a day (QID) | ORAL | Status: DC | PRN

## 2023-02-28 SURGICAL SUPPLY — 37 items
BASIN KIT SINGLE STR (MISCELLANEOUS) ×1 IMPLANT
BRUSH SCRUB EZ 4% CHG (MISCELLANEOUS) ×1 IMPLANT
BUR NEURO DRILL SOFT 3.0X3.8M (BURR) ×1 IMPLANT
DERMABOND ADVANCED .7 DNX12 (GAUZE/BANDAGES/DRESSINGS) ×1 IMPLANT
DRAPE C-ARM XRAY 36X54 (DRAPES) ×2 IMPLANT
DRAPE LAPAROTOMY 100X77 ABD (DRAPES) ×1 IMPLANT
DRAPE MICROSCOPE SPINE 48X150 (DRAPES) ×1 IMPLANT
DRSG OPSITE POSTOP 3X4 (GAUZE/BANDAGES/DRESSINGS) ×1 IMPLANT
DRSG TEGADERM 4X4.75 (GAUZE/BANDAGES/DRESSINGS) ×1 IMPLANT
ELECT EZSTD 165MM 6.5IN (MISCELLANEOUS) ×1
ELECT REM PT RETURN 9FT ADLT (ELECTROSURGICAL) ×1
ELECTRODE EZSTD 165MM 6.5IN (MISCELLANEOUS) ×1 IMPLANT
ELECTRODE REM PT RTRN 9FT ADLT (ELECTROSURGICAL) ×1 IMPLANT
EVACUATOR 1/8 PVC DRAIN (DRAIN) IMPLANT
GAUZE SPONGE 2X2 STRL 8-PLY (GAUZE/BANDAGES/DRESSINGS) IMPLANT
GLOVE BIOGEL PI IND STRL 8 (GLOVE) ×2 IMPLANT
GLOVE SURG SYN 7.5 E (GLOVE) ×1
GLOVE SURG SYN 7.5 PF PI (GLOVE) ×1 IMPLANT
GOWN SRG XL LVL 3 NONREINFORCE (GOWNS) ×1 IMPLANT
GOWN STRL REUS W/ TWL LRG LVL3 (GOWN DISPOSABLE) ×1 IMPLANT
KIT PREVENA INCISION MGT 13 (CANNISTER) IMPLANT
KIT WILSON FRAME (KITS) ×1 IMPLANT
KNIFE BAYONET SHORT DISCETOMY (MISCELLANEOUS) IMPLANT
NDL SAFETY ECLIPSE 18X1.5 (NEEDLE) ×1 IMPLANT
NS IRRIG 500ML POUR BTL (IV SOLUTION) ×1 IMPLANT
PACK LAMINECTOMY ARMC (PACKS) ×1 IMPLANT
PAD ARMBOARD 7.5X6 YLW CONV (MISCELLANEOUS) ×2 IMPLANT
STAPLER SKIN PROX 35W (STAPLE) IMPLANT
SURGIFLO W/THROMBIN 8M KIT (HEMOSTASIS) ×1 IMPLANT
SUT ETHILON 3-0 FS-10 30 BLK (SUTURE) ×1
SUT STRATA 3-0 15 PS-2 (SUTURE) ×1 IMPLANT
SUT VIC AB 0 CT1 27XCR 8 STRN (SUTURE) ×1 IMPLANT
SUT VIC AB 2-0 CT1 18 (SUTURE) ×1 IMPLANT
SUTURE EHLN 3-0 FS-10 30 BLK (SUTURE) IMPLANT
SYR 30ML LL (SYRINGE) ×2 IMPLANT
SYR 3ML LL SCALE MARK (SYRINGE) ×1 IMPLANT
TRAP FLUID SMOKE EVACUATOR (MISCELLANEOUS) ×1 IMPLANT

## 2023-02-28 NOTE — Op Note (Signed)
Indications: 73 year old woman with a history of lumbar stenosis and claudication with radicular symptoms as well secondary to a disc herniation.  She had failed conservative therapy.  She had weakness she has had both home health therapy as well as physical therapy and has not had any improvement.  Given her lack of improvement while exhausting her conservative management we planned for decompression.   Findings: Severe compression of the neural elements secondary to facet hypertrophy and ligamentum flavum hypertrophy.  Well decompressed at the end of the procedure.  Calcified disc fragment at L4-5  Preoperative Diagnosis:  Spinal stenosis with neurogenic claudication, M48.02 Lumbar disc herniation with radiculopathy, M51.06 Right leg/foot weakness, R29.898  Postoperative Diagnosis: same  Procedure: 1.  Lumbar laminectomy L3-S1 2.  L4-5 discectomy, left sided approach  EBL: 250 ml IVF: see anesthesia record Drains: Hemovac drain Disposition: Extubated and Stable to PACU Complications: none  No foley catheter was placed.   Preoperative Note:   Risks of surgery discussed include: infection, bleeding, stroke, coma, death, paralysis, CSF leak, nerve/spinal cord injury, numbness, tingling, weakness, complex regional pain syndrome, recurrent stenosis and/or disc herniation, vascular injury, development of instability, neck/back pain, need for further surgery, persistent symptoms, development of deformity, and the risks of anesthesia. They understood these risks and have agreed to proceed.  Operative Note:   The patient was then brought from the preoperative center with intravenous access established.  The patient underwent general anesthesia and endotracheal tube intubation, then was rotated on the [facility] table where all pressure points were appropriately padded.  An incision was marked with flouroscopy. The skin was then thoroughly cleansed.  Perioperative antibiotic prophylaxis was  administered.  Sterile prep and drapes were then applied and a timeout was then observed.    Once this was complete, a midline incision was planned over the level of her previous incision.  The skin was opened with a 10 blade, the skin and subcutaneous tissues were then dissected with Bovie cautery.  We are able to identify the spinous processes in which we performed a subperiosteal dissection exposing the posterior medial elements of L4-S1.  We utilized a Cobb retractor as well as blunt dissection over the areas of previous intervention to avoid CSF leak.  Once we had adequate exposure we verified this with fluoroscopy.  We then turned our attention to the posterior decompression.  We performed a laminectomy with a high-speed drill starting at L3-4.  We removed the lamina of L3 with a high-speed drill and brought this down to the superior aspect of L4.  We then utilized the upgoing curette to dissect the ligamentum free from the underside of the lamina on the lateral aspects of this dissection.  We then performed the same procedure at L4-5 as well as L5 and S1 once we had good bony decompression we then turned our attention to the ligamentous decompression.  We resected the ligament between L3-4 and L5-S1.  We used this to find a plane with no dural adhesions that we were able to slowly work our way towards the area of previous intervention where all the scar tissue was noted.  Once the scar tissue was dissected off of the dura we are able to obtain a good central decompression bilaterally.  We expanded our decompression laterally to be able to access the lateral recesses which were also decompressed.  In order to do so we performed a partial medial facetectomies at each level.  We are able to feel out the foramen at L3-4 and  at L5-S1.  At L4-5 there is disc herniation so we came down on the left side of the dura and retracted the nerve root medially.  We are able to clearly identify the disc fragment, at  this point it was quite calcified.  We shaved down some of the disc fragment but there is no evidence of any extrusion or any free fragments.  We then verified this with intraoperative ultrasound.  The intraoperative ultrasound demonstrated a good decompression throughout with clear pulsatile nerve roots.  After decompression was done we verified that it went from L3 to the S1 pedicle.  We irrigated copiously.  We placed Depo-Medrol the nerve roots.  We placed a subfascial drain.  We then closed in multiple layers.  A sterile wound VAC was placed over top of the incision..  Patient was then rotated back to the preoperative bed awakened from anesthesia and taken to recovery all counts are correct in this case.  I performed the entire procedure with the assistance of Frisbie, PA-C, she helped with exposure, tissue retraction, tissue exposure, protection of the neural elements during decompression, I performed the critical aspects of the case myself.   Lovenia Kim, MD [facility]

## 2023-02-28 NOTE — Anesthesia Procedure Notes (Signed)
Procedure Name: Intubation Date/Time: 02/28/2023 12:50 PM  Performed by: Rich Brave, CRNAPre-anesthesia Checklist: Patient identified, Emergency Drugs available, Suction available, Patient being monitored and Timeout performed Patient Re-evaluated:Patient Re-evaluated prior to induction Oxygen Delivery Method: Circle system utilized Preoxygenation: Pre-oxygenation with 100% oxygen Induction Type: IV induction Ventilation: Mask ventilation without difficulty Laryngoscope Size: McGrath Grade View: Grade I Tube size: 6.5 mm Number of attempts: 1 Airway Equipment and Method: Stylet and Video-laryngoscopy Placement Confirmation: ETT inserted through vocal cords under direct vision, positive ETCO2 and breath sounds checked- equal and bilateral Secured at: 19 cm Tube secured with: Tape Dental Injury: Teeth and Oropharynx as per pre-operative assessment

## 2023-02-28 NOTE — Progress Notes (Deleted)
Subjective:    Patient ID: [name], female    DOB: 1950-12-06, 73 y.o.   MRN: [id number]  HPI  Patient presents to clinic today for follow-up of chronic conditions.  HTN: Her BP today is 134/68.  She is taking carvedilol, diltiazem and lisinopril as prescribed.  ECG from 01/2023 reviewed.  HLD with carotid/thoracic atherosclerosis, PAD with claudication: Her last LDL was 48, triglycerides 409, 08/2022.  She is not taking rosuvastatin as prescribed but is taking fenofibrate.  She is taking aspirin as prescribed.  She does not consume low-fat diet.  She follows with vascular.  COPD: She reports chronic cough and shortness of breath.  She is taking breo and albuterol as prescribed.  There are no PFTs on file.  She no longer smokes.  She does not follow with pulmonology.  CKD 3: Her last creatinine was 1.18, GFR 49, 01/2023.  She is on lisinopril for renal protection.  She does not follow with nephrology.  OA: Mainly in her back and hips.  She recently had surgery for lumbar spinal stenosis.  She takes tylenol OTC with some relief of symptoms.  She does not follow with orthopedics but does follow with neurosurgery.  Depression, anxiety and panic attacks: Chronic, managed on citalopram, hydroxyzine and clonazepam as prescribed but is no longer taking buspirone.  She is not currently seeing a therapist or psychiatrist.  She denies SI/HI.  GERD: Triggered by.  She denies breakthrough on pantoprazole.  There is no upper GI on file.  Insomnia: She has difficulty staying asleep.  She takes doxepin at bedtime with good relief of symptoms.  There is no sleep study on file.  DM2: Her last A1c was 6.4%, 02/2023.  She is not taking any oral diabetic medication at this time. She has not seen an eye doctor in the last year. She does not check her sugars. Flu 12/2022. Pneumovax 04/2019. Prevnar 02/2023. Covid never.  Hypothyroidism: She denies any issues on her current dose of levothyroxine.  She does not  follow with endocrinology.  Psoriasis: Managed with clobetasol.  She is no longer taking Tremfya.  She follows with dermatology.  Review of Systems     Past Medical History:  Diagnosis Date   Anxiety    Arthritis    Chronic back pain    COPD (chronic obstructive pulmonary disease) (HCC)    Depression    GERD (gastroesophageal reflux disease)    Hypertension    Hypothyroidism    Neuromuscular disorder (HCC)    Pneumonia    RECENT X 3   PONV (postoperative nausea and vomiting)    Psoriasis    Thyroid disease     Current Outpatient Medications  Medication Sig Dispense Refill   acetaminophen (TYLENOL) 500 MG tablet Take 500 mg by mouth every 6 (six) hours as needed for moderate pain.      albuterol (VENTOLIN HFA) 108 (90 Base) MCG/ACT inhaler INHALE 2 PUFFS BY MOUTH EVERY 6 HOURS AS NEEDED FOR WHEEZING OR SHORTNESS OF BREATH 8.5 g 2   aspirin EC (ASPIRIN LOW DOSE) 81 MG tablet TAKE 1 TABLET(81 MG) BY MOUTH DAILY. SWALLOW WHOLE 90 tablet 1   carvedilol (COREG) 3.125 MG tablet TAKE 1 TABLET BY MOUTH TWICE DAILY WITH A MEAL 180 tablet 0   citalopram (CELEXA) 40 MG tablet TAKE 1 TABLET(40 MG) BY MOUTH DAILY 90 tablet 0   clobetasol cream (TEMOVATE) 0.05 % Apply 1 Application topically as needed.     clonazePAM (KLONOPIN) 1 MG  tablet TAKE 1 TABLET BY MOUTH EVERY MORNING, 1/2 TABLET EVERY AFTERNOON AND 1 TABLET EVERY NIGHT AT BEDTIME 75 tablet 0   diltiazem (CARDIZEM CD) 120 MG 24 hr capsule TAKE 1 CAPSULE BY MOUTH EVERY DAY 90 capsule 0   doxepin (SINEQUAN) 25 MG capsule Take 50 mg by mouth at bedtime.     fenofibrate (TRICOR) 145 MG tablet Take 1 tablet (145 mg total) by mouth daily. 90 tablet 0   fluticasone furoate-vilanterol (BREO ELLIPTA) 100-25 MCG/ACT AEPB Inhale 1 puff into the lungs daily. 1 each 0   hydrOXYzine (ATARAX) 25 MG tablet Take 25 mg by mouth at bedtime.     ipratropium-albuterol (DUONEB) 0.5-2.5 (3) MG/3ML SOLN Take 3 mLs by nebulization every 6 (six) hours as  needed (wheezing / shortness of breath). 60 mL 0   levothyroxine (SYNTHROID) 100 MCG tablet Take 1 tablet (100 mcg total) by mouth daily. 90 tablet 1   lisinopril (ZESTRIL) 10 MG tablet TAKE 1 TABLET(10 MG) BY MOUTH DAILY 90 tablet 0   Misc. Devices (QUAD CANE/SMALL BASE) MISC 1 Device by Does not apply route daily. 1 each 0   pantoprazole (PROTONIX) 40 MG tablet SMARTSIG:1 Tablet(s) By Mouth Every Evening     rosuvastatin (CRESTOR) 20 MG tablet Take 1 tablet (20 mg total) by mouth daily. (Patient not taking: Reported on 02/14/2023) 90 tablet 3   triamcinolone ointment (KENALOG) 0.1 % Apply 1 Application topically as needed.     Vitamin D, Ergocalciferol, (DRISDOL) 1.25 MG (50000 UNIT) CAPS capsule Take 50,000 Units by mouth 2 (two) times a week.     No current facility-administered medications for this visit.    Allergies  Allergen Reactions   Ciprofloxacin Hives and Diarrhea   Gabapentin Other (See Comments)    hallucination   Mucinex [Guaifenesin Er] Nausea Only    Per pt   Oxycodone Diarrhea   Oxycodone-Acetaminophen Diarrhea   Trazodone And Nefazodone Other (See Comments)    Patient stated - "medication makes me "loopy" and "out of my mind"   Oxycodone-Acetaminophen Palpitations   Sulfa Antibiotics Rash    Throat, mouth Throat, mouth    Family History  Problem Relation Age of Onset   Heart disease Mother    Breast cancer Mother    Heart attack Mother    COPD Father    Stroke Paternal Uncle    Kidney disease Sister    Breast cancer Cousin    Breast cancer Cousin    Heart attack Maternal Aunt    Stroke Paternal Grandfather    Kidney cancer Neg Hx    Bladder Cancer Neg Hx     Social History   Socioeconomic History   Marital status: Widowed    Spouse name: Not on file   Number of children: Not on file   Years of education: Not on file   Highest education level: Not on file  Occupational History   Occupation: retired  Tobacco Use   Smoking status: Former     Current packs/day: 0.00    Average packs/day: 1 pack/day for 35.0 years (35.0 ttl pk-yrs)    Types: Cigarettes    Start date: 04/19/2018    Quit date: 11/16/2020    Years since quitting: 2.2   Smokeless tobacco: Never   Tobacco comments:    Previously smoked 2 ppd.   Vaping Use   Vaping status: Never Used  Substance and Sexual Activity   Alcohol use: No   Drug use: No   Sexual activity:  Not Currently    Birth control/protection: Surgical  Other Topics Concern   Not on file  Social History Narrative   Not on file   Social Drivers of Health   Financial Resource Strain: Low Risk  (04/27/2022)   Overall Financial Resource Strain (CARDIA)    Difficulty of Paying Living Expenses: Not hard at all  Food Insecurity: No Food Insecurity (01/04/2023)   Hunger Vital Sign    Worried About Running Out of Food in the Last Year: Never true    Ran Out of Food in the Last Year: Never true  Transportation Needs: No Transportation Needs (01/04/2023)   PRAPARE - Administrator, Civil Service (Medical): No    Lack of Transportation (Non-Medical): No  Physical Activity: Inactive (04/27/2022)   Exercise Vital Sign    Days of Exercise per Week: 0 days    Minutes of Exercise per Session: 0 min  Stress: No Stress Concern Present (04/27/2022)   [employer] of Occupational Health - Occupational Stress Questionnaire    Feeling of Stress : Only a little  Social Connections: Moderately Isolated (04/27/2022)   Social Connection and Isolation Panel [NHANES]    Frequency of Communication with Friends and Family: More than three times a week    Frequency of Social Gatherings with Friends and Family: Three times a week    Attends Religious Services: More than 4 times per year    Active Member of Clubs or Organizations: No    Attends Banker Meetings: Never    Marital Status: Widowed  Intimate Partner Violence: Not At Risk (01/04/2023)   Humiliation, Afraid, Rape, and Kick  questionnaire    Fear of Current or Ex-Partner: No    Emotionally Abused: No    Physically Abused: No    Sexually Abused: No     Constitutional: Denies fever, malaise, fatigue, headache or abrupt weight changes.  HEENT: Denies eye pain, eye redness, ear pain, ringing in the ears, wax buildup, runny nose, nasal congestion, bloody nose, or sore throat. Respiratory: Patient reports chronic cough and shortness of breath.  Denies difficulty breathing, or sputum production.   Cardiovascular: Denies chest pain, chest tightness, palpitations or swelling in the hands or feet.  Gastrointestinal: Denies abdominal pain, bloating, constipation, diarrhea or blood in the stool.  GU: Denies urgency, frequency, pain with urination, burning sensation, blood in urine, odor or discharge. Musculoskeletal: Patient reports joint pain.  Denies decrease in range of motion, difficulty with gait, muscle pain or joint swelling.  Skin: Denies redness, rashes, lesions or ulcercations.  Neurological: Patient reports insomnia.  Denies dizziness, difficulty with memory, difficulty with speech or problems with balance and coordination.  Psych: Patient has a history of anxiety and depression.  Denies SI/HI.  No other specific complaints in a complete review of systems (except as listed in HPI above).  Objective:   Physical Exam There were no vitals taken for this visit.  Wt Readings from Last 3 Encounters:  02/08/23 202 lb (91.6 kg)  02/04/23 194 lb (88 kg)  01/10/23 199 lb 15.3 oz (90.7 kg)    General: Appears t her stated age, obese, in NAD. Skin: Warm, dry and intact. No ulcerations noted. HEENT: Head: normal shape and size; Eyes: sclera white, no icterus, conjunctiva pink, PERRLA and EOMs intact;  Neck:  Neck supple, trachea midline. No masses, lumps or thyromegaly present.  Cardiovascular: Normal rate and rhythm. S1,S2 noted.  No murmur, rubs or gallops noted. No JVD or BLE edema.  No carotid bruits  noted. Pulmonary/Chest: Normal effort and positive vesicular breath sounds. No respiratory distress. No wheezes, rales or ronchi noted.  Abdomen: Soft and nontender. Normal bowel sounds. Musculoskeletal: No difficulty with gait.  Neurological: Alert and oriented. Coordination normal.  Psychiatric: Mood and affect normal.  Anxious appearing.  Judgment and thought content normal.    BMET    Component Value Date/Time   NA 136 01/10/2023 0108   NA 142 06/24/2015 1102   NA 135 (L) 02/02/2013 1021   K 4.0 01/10/2023 0108   K 3.7 02/02/2013 1021   CL 96 (L) 01/10/2023 0108   CL 103 02/02/2013 1021   CO2 29 01/10/2023 0108   CO2 30 02/02/2013 1021   GLUCOSE 173 (H) 01/10/2023 0108   GLUCOSE 152 (H) 02/02/2013 1021   BUN 31 (H) 01/10/2023 0108   BUN 12 06/24/2015 1102   BUN 8 02/02/2013 1021   CREATININE 1.18 (H) 01/10/2023 0108   CREATININE 1.02 (H) 08/28/2022 1445   CALCIUM 8.5 (L) 01/10/2023 0108   CALCIUM 9.9 02/02/2013 1021   GFRNONAA 49 (L) 01/10/2023 0108   GFRNONAA 72 12/03/2019 0947   GFRAA 84 12/03/2019 0947    Lipid Panel     Component Value Date/Time   CHOL 117 08/28/2022 1445   CHOL 205 (H) 02/03/2015 1022   TRIG 197 (H) 08/28/2022 1445   HDL 42 (L) 08/28/2022 1445   HDL 36 (L) 02/03/2015 1022   CHOLHDL 2.8 08/28/2022 1445   LDLCALC 48 08/28/2022 1445    CBC    Component Value Date/Time   WBC 11.0 (H) 01/10/2023 0108   RBC 4.88 01/10/2023 0108   HGB 14.4 01/10/2023 0108   HGB 15.6 06/24/2015 1102   HCT 46.1 (H) 01/10/2023 0108   HCT 46.6 06/24/2015 1102   PLT 349 01/10/2023 0108   PLT 307 06/24/2015 1102   MCV 94.5 01/10/2023 0108   MCV 93 06/24/2015 1102   MCV 90 02/02/2013 1021   MCH 29.5 01/10/2023 0108   MCHC 31.2 01/10/2023 0108   RDW 13.3 01/10/2023 0108   RDW 13.3 06/24/2015 1102   RDW 13.2 02/02/2013 1021   LYMPHSABS 0.6 (L) 01/10/2023 0108   LYMPHSABS 2.3 06/24/2015 1102   MONOABS 0.6 01/10/2023 0108   EOSABS 0.5 01/10/2023 0108    EOSABS 0.3 06/24/2015 1102   BASOSABS 0.0 01/10/2023 0108   BASOSABS 0.1 06/24/2015 1102    Hgb A1C Lab Results  Component Value Date   HGBA1C 6.4 (A) 02/08/2023            Assessment & Plan:      RTC in 6 months for your annual exam Nicki Reaper, NP

## 2023-02-28 NOTE — Interval H&P Note (Signed)
History and Physical Interval Note:  02/28/2023 12:08 PM  [name]  has presented today for surgery, with the diagnosis of M48.02 Spinal stenosis, lumbar region, with neurogenic claudication  M51.06 Lumbar disc herniation with myelopathy R29.898 Weakness of foot, left.  The various methods of treatment have been discussed with the patient and family. After consideration of risks, benefits and other options for treatment, the patient has consented to  Procedure(s): L3-4 LAMINECTOMY, L4-5 LAMINECTOMY AND DISCECTOMY OF LEFT SIDE, L5-S1 LAMINECTOMY (N/A) as a surgical intervention.  The patient's history has been reviewed, patient examined, no change in status, stable for surgery.  I have reviewed the patient's chart and labs.  Questions were answered to the patient's satisfaction.    Heart and lungs clear.  Lovenia Kim

## 2023-02-28 NOTE — Anesthesia Postprocedure Evaluation (Signed)
Anesthesia Post Note  Patient: LEBERTA WHOOLERY  Procedure(s) Performed: L3-4 LAMINECTOMY, L4-5 LAMINECTOMY AND DISCECTOMY OF LEFT SIDE, L5-S1 LAMINECTOMY  Patient location during evaluation: PACU Anesthesia Type: General Level of consciousness: awake and alert, oriented and patient cooperative Pain management: pain level controlled Vital Signs Assessment: post-procedure vital signs reviewed and stable Respiratory status: spontaneous breathing, nonlabored ventilation and respiratory function stable Cardiovascular status: blood pressure returned to baseline and stable Postop Assessment: adequate PO intake Anesthetic complications: no   No notable events documented.   Last Vitals:  Vitals:   02/28/23 1633 02/28/23 1700  BP: (!) 132/108 (!) 109/49  Pulse: 67 85  Resp: 18 12  Temp: 36.7 C   SpO2: 97% 93%    Last Pain:  Vitals:   02/28/23 1711  TempSrc:   PainSc: 6                  Reed Breech

## 2023-02-28 NOTE — Transfer of Care (Signed)
Immediate Anesthesia Transfer of Care Note  Patient: [name]  Procedure(s) Performed: L3-4 LAMINECTOMY, L4-5 LAMINECTOMY AND DISCECTOMY OF LEFT SIDE, L5-S1 LAMINECTOMY  Patient Location: PACU  Anesthesia Type:General  Level of Consciousness: drowsy  Airway & Oxygen Therapy: Patient Spontanous Breathing and Patient connected to face mask oxygen  Post-op Assessment: Report given to RN and Post -op Vital signs reviewed and stable  Post vital signs: Reviewed and stable  Last Vitals:  Vitals Value Taken Time  BP 110/63 02/28/23 1635  Temp 97.1   Pulse 84 02/28/23 1637  Resp 17 02/28/23 1637  SpO2 97 % 02/28/23 1637  Vitals shown include unfiled device data.  Last Pain:  Vitals:   02/28/23 1129  TempSrc: Temporal  PainSc: 9          Complications: No notable events documented.

## 2023-03-01 ENCOUNTER — Encounter: Payer: Self-pay | Admitting: Neurosurgery

## 2023-03-01 MED ORDER — SODIUM CHLORIDE 0.9 % IV BOLUS
1000.0000 mL | Freq: Once | INTRAVENOUS | Status: AC
  Administered 2023-03-01: 1000 mL via INTRAVENOUS

## 2023-03-01 MED ORDER — SODIUM CHLORIDE 0.9 % IV SOLN
250.0000 mL | INTRAVENOUS | Status: AC
  Administered 2023-03-01: 250 mL via INTRAVENOUS

## 2023-03-01 NOTE — Evaluation (Signed)
Physical Therapy Evaluation Patient Details Name: [name] MRN: [id number] DOB: 04/19/1950 Today's Date: 03/01/2023  History of Present Illness  Pt is a 73 y.o. female s/p L3-4 laminectomy, L4-5 laminectomy/discectomy L side, and L5-S1 laminectomy on 02/28/23. PMH significant for COPD, arthritis, GERD, HTN, pneumonia, carpal tunnel release, and PAD  Clinical Impression  Pt is a pleasant 73 year old female who was admitted for lumbar radiculopathy and is POD 1 s/p multiple laminectomy. Pt performs bed mobility with mod assist and transfers with min assist and RW. Pt demonstrates deficits with strength/pain/mobility. Pt with baseline stress incontinence, would benefit from purewick. Pt is currently not at baseline level. Would benefit from skilled PT to address above deficits and promote optimal return to PLOF. Pt will continue to receive skilled PT services while admitted and will defer to TOC/care team for updates regarding disposition planning.         If plan is discharge home, recommend the following: A lot of help with walking and/or transfers;A lot of help with bathing/dressing/bathroom;Help with stairs or ramp for entrance;Assist for transportation   Can travel by private vehicle   Yes    Equipment Recommendations  (TBD)  Recommendations for Other Services       Functional Status Assessment Patient has had a recent decline in their functional status and demonstrates the ability to make significant improvements in function in a reasonable and predictable amount of time.     Precautions / Restrictions Precautions Precautions: Fall;Back Precaution Booklet Issued: No Restrictions Weight Bearing Restrictions Per Provider Order: No      Mobility  Bed Mobility Overal bed mobility: Needs Assistance Bed Mobility: Supine to Sit     Supine to sit: Mod assist     General bed mobility comments: needs heavy assist including bed functions. Once seated, upright posture     Transfers Overall transfer level: Needs assistance Equipment used: Rolling walker (2 wheels) Transfers: Bed to chair/wheelchair/BSC, Sit to/from Stand Sit to Stand: Min assist   Step pivot transfers: Min assist       General transfer comment: follows commands, however limited by pain. Unable to truly ambulate further distance at this time    Ambulation/Gait               General Gait Details: unable  Stairs            Wheelchair Mobility     Tilt Bed    Modified Rankin (Stroke Patients Only)       Balance Overall balance assessment: History of Falls, Needs assistance Sitting-balance support: Feet supported, No upper extremity supported Sitting balance-Leahy Scale: Good     Standing balance support: Reliant on assistive device for balance, Bilateral upper extremity supported, During functional activity Standing balance-Leahy Scale: Fair                               Pertinent Vitals/Pain Pain Assessment Pain Assessment: 0-10 Pain Score: 9  Pain Location: LBP Pain Descriptors / Indicators: Discomfort, Dull, Grimacing, Aching Pain Intervention(s): Limited activity within patient's tolerance, Patient requesting pain meds-RN notified, Repositioned    Home Living Family/patient expects to be discharged to:: Private residence Living Arrangements: Alone Available Help at Discharge: Family;Available PRN/intermittently Type of Home: Apartment Home Access: Level entry       Home Layout: One level Home Equipment: BSC/3in1;Rollator (4 wheels);Cane - single point Additional Comments: has son who lives close by, but he works night shift  Prior Function Prior Level of Function : Needs assist             Mobility Comments: rollator for household distances, endorses 4-5 falls in past mo ADLs Comments: Mod indep with ADL, slip on shoes, light meal prep, only wearing 2L at night. Son assists with transporation t/f appts and grocery  stores     Extremity/Trunk Assessment   Upper Extremity Assessment Upper Extremity Assessment: Generalized weakness    Lower Extremity Assessment Lower Extremity Assessment: Generalized weakness (B LE grossly 3+/5)    Cervical / Trunk Assessment Cervical / Trunk Assessment: Back Surgery  Communication   Communication Communication: No apparent difficulties  Cognition Arousal: Alert Behavior During Therapy: WFL for tasks assessed/performed Overall Cognitive Status: Within Functional Limits for tasks assessed                                 General Comments: A&Ox4        General Comments General comments (skin integrity, edema, etc.): small wound vac and hemovac    Exercises Other Exercises Other Exercises: assisted in donning mesh panties and pad due to baseline incontience   Assessment/Plan    PT Assessment Patient needs continued PT services  PT Problem List Decreased strength;Decreased balance;Decreased mobility;Pain;Obesity;Decreased knowledge of use of DME;Decreased knowledge of precautions       PT Treatment Interventions DME instruction;Gait training;Therapeutic activities;Therapeutic exercise    PT Goals (Current goals can be found in the Care Plan section)  Acute Rehab PT Goals Patient Stated Goal: to go home PT Goal Formulation: With patient Time For Goal Achievement: 03/15/23 Potential to Achieve Goals: Good    Frequency 7X/week     Co-evaluation               AM-PAC PT "6 Clicks" Mobility  Outcome Measure Help needed turning from your back to your side while in a flat bed without using bedrails?: A Lot Help needed moving from lying on your back to sitting on the side of a flat bed without using bedrails?: A Lot Help needed moving to and from a bed to a chair (including a wheelchair)?: A Little Help needed standing up from a chair using your arms (e.g., wheelchair or bedside chair)?: A Little Help needed to walk in hospital  room?: A Lot Help needed climbing 3-5 steps with a railing? : Total 6 Click Score: 13    End of Session Equipment Utilized During Treatment: Gait belt;Oxygen Activity Tolerance: Patient tolerated treatment well Patient left: in chair;with chair alarm set Nurse Communication: Mobility status PT Visit Diagnosis: Repeated falls (R29.6);Muscle weakness (generalized) (M62.81);Difficulty in walking, not elsewhere classified (R26.2);Pain Pain - Right/Left:  (back) Pain - part of body:  (back)    Time: 4098-1191 PT Time Calculation (min) (ACUTE ONLY): 29 min   Charges:   PT Evaluation $PT Eval Moderate Complexity: 1 Mod PT Treatments $Therapeutic Activity: 8-22 mins PT General Charges $$ ACUTE PT VISIT: 1 Visit         Elizabeth Palau, PT, DPT, [facility] [phone]   Toniya Rozar 03/01/2023, 3:28 PM

## 2023-03-01 NOTE — Evaluation (Signed)
Occupational Therapy Evaluation Patient Details Name: [name] MRN: [id number] DOB: 02-Feb-1951 Today's Date: 03/01/2023   History of Present Illness Pt is a 73 y.o. female s/p L3-4 laminectomy, L4-5 laminectomy/discectomy L side, and L5-S1 laminectomy on 02/28/23. PMH significant for COPD, arthritis, GERD, HTN, pneumonia, carpal tunnel release, and PAD   Clinical Impression   Prior to hospital admission, pt was mod independent with 4WW for functional mobility, hx of multiple falls. Pt lives alone, son provides assist with transportation PRN. Pt currently requires minA to transfer from recliner > bed with 4WW, maxA for LB dressing to don/doff underwear, and maxA to perform log roll technique to return to R sidelying. Pt would benefit from additional AE instruction to improve independence in LB dressing and further enforce back precautions.   Pt would benefit from skilled OT services to address noted impairments and functional limitations (see below for any additional details) in order to maximize safety and independence while minimizing falls risk and caregiver burden. Patient will benefit from continued inpatient follow up therapy, <3 hours/day.       If plan is discharge home, recommend the following: A little help with walking and/or transfers;A lot of help with bathing/dressing/bathroom;Assist for transportation;Assistance with cooking/housework    Functional Status Assessment  Patient has had a recent decline in their functional status and demonstrates the ability to make significant improvements in function in a reasonable and predictable amount of time.  Equipment Recommendations  None recommended by OT    Recommendations for Other Services       Precautions / Restrictions Precautions Precautions: Fall;Back Restrictions Weight Bearing Restrictions Per Provider Order: No      Mobility Bed Mobility Overal bed mobility: Needs Assistance Bed Mobility: Sit to Sidelying          Sit to sidelying: Max assist General bed mobility comments: maxA to return to supine using logroll technique, assist for trunk and BLE    Transfers Overall transfer level: Needs assistance Equipment used: Rollator (4 wheels) Transfers: Bed to chair/wheelchair/BSC, Sit to/from Stand Sit to Stand: Min assist     Step pivot transfers: Min assist     General transfer comment: good adherence to back precautions      Balance Overall balance assessment: History of Falls, Needs assistance Sitting-balance support: Feet supported, No upper extremity supported Sitting balance-Leahy Scale: Good     Standing balance support: Reliant on assistive device for balance, Bilateral upper extremity supported, During functional activity Standing balance-Leahy Scale: Fair                             ADL either performed or assessed with clinical judgement   ADL Overall ADL's : Needs assistance/impaired Eating/Feeding: Modified independent;Sitting Eating/Feeding Details (indicate cue type and reason): pt finishing lunch in recliner on  OT arrival                 Lower Body Dressing: Minimal assistance;Sitting/lateral leans;Cueing for back precautions;Maximal assistance Lower Body Dressing Details (indicate cue type and reason): maxA to don/doff new underwear. pt unable to perform figure four, will need LB AE to perform independently Toilet Transfer: Minimal assistance;Rollator (4 wheels);Cueing for sequencing;Cueing for safety Toilet Transfer Details (indicate cue type and reason): minA to transfer from recliner to bed, cues for back precautions Toileting- Clothing Manipulation and Hygiene: Maximal assistance;Sit to/from stand Toileting - Clothing Manipulation Details (indicate cue type and reason): OT educates on pericare with lateral leans, pt return  demos     Functional mobility during ADLs: Minimal assistance;Cueing for sequencing;Cueing for safety;Rollator (4  wheels) General ADL Comments: Pt will benefit from AE education and training for independence in LB ADLs      Pertinent Vitals/Pain Pain Assessment Pain Assessment: 0-10 Pain Score: 9  Pain Location: LBP Pain Descriptors / Indicators: Discomfort, Dull, Grimacing, Aching Pain Intervention(s): Limited activity within patient's tolerance, Monitored during session, Repositioned     Extremity/Trunk Assessment Upper Extremity Assessment Upper Extremity Assessment: Generalized weakness;Right hand dominant   Lower Extremity Assessment Lower Extremity Assessment: Defer to PT evaluation   Cervical / Trunk Assessment Cervical / Trunk Assessment: Back Surgery   Communication Communication Communication: No apparent difficulties   Cognition Arousal: Alert Behavior During Therapy: WFL for tasks assessed/performed Overall Cognitive Status: Within Functional Limits for tasks assessed                                 General Comments: A&Ox4     General Comments  hemovac/wound vac intact start and end of session. no drainage through dressing.            Home Living Family/patient expects to be discharged to:: Private residence Living Arrangements: Alone Available Help at Discharge: Family;Available PRN/intermittently Type of Home: Apartment Home Access: Level entry     Home Layout: One level     Bathroom Shower/Tub: Chief Strategy Officer: Handicapped height (BSC over commode) Bathroom Accessibility: Yes How Accessible: Accessible via walker Home Equipment: BSC/3in1;Rollator (4 wheels);Cane - single point          Prior Functioning/Environment Prior Level of Function : Needs assist             Mobility Comments: rollator for household distances, endorses 4-5 falls in past mo ADLs Comments: Mod indep with ADL, slip on shoes, light meal prep, only wearing 2L at night. Son assists with transporation t/f appts and grocery stores        OT  Problem List: Pain;Obesity;Decreased strength;Decreased range of motion;Decreased activity tolerance;Impaired balance (sitting and/or standing);Decreased knowledge of precautions;Decreased knowledge of use of DME or AE      OT Treatment/Interventions: Self-care/ADL training;Neuromuscular education;Energy conservation;DME and/or AE instruction;Therapeutic activities;Patient/family education;Balance training;Therapeutic exercise    OT Goals(Current goals can be found in the care plan section) Acute Rehab OT Goals OT Goal Formulation: With patient Time For Goal Achievement: 03/15/23 Potential to Achieve Goals: Good  OT Frequency: Min 1X/week       AM-PAC OT "6 Clicks" Daily Activity     Outcome Measure Help from another person eating meals?: None Help from another person taking care of personal grooming?: None Help from another person toileting, which includes using toliet, bedpan, or urinal?: A Lot Help from another person bathing (including washing, rinsing, drying)?: A Lot Help from another person to put on and taking off regular upper body clothing?: A Little Help from another person to put on and taking off regular lower body clothing?: A Lot 6 Click Score: 17   End of Session Equipment Utilized During Treatment: Rolling walker (2 wheels);Gait belt;Oxygen Nurse Communication: Mobility status;Patient requests pain meds  Activity Tolerance: Patient tolerated treatment well;No increased pain Patient left: in bed;with call bell/phone within reach;with bed alarm set  OT Visit Diagnosis: Unsteadiness on feet (R26.81);History of falling (Z91.81);Other abnormalities of gait and mobility (R26.89);Repeated falls (R29.6);Pain Pain - Right/Left: Right Pain - part of body:  (back)  Time: 1310-1340 OT Time Calculation (min): 30 min Charges:  OT General Charges $OT Visit: 1 Visit OT Evaluation $OT Eval Moderate Complexity: 1 Mod OT Treatments $Self Care/Home Management : 8-22  mins Katilynn Sinkler L. Emalia Witkop, OTR/L  03/01/23, 2:01 PM

## 2023-03-01 NOTE — Progress Notes (Signed)
   Neurosurgery Progress Note  History: [name] is here for L3-4 laminectomy, L4-5 laminectomy and discectomy of left side, L5-S1 laminectomy.  She is doing well POD1. She states her pain is under control.   Physical Exam: Vitals:   03/01/23 0400 03/01/23 0816  BP:  124/63  Pulse:  98  Resp:  16  Temp: 98.9 F (37.2 C) 97.9 F (36.6 C)  SpO2:  90%    AA Ox3 CNI HV drain in place Prevena providing adequate suction  Strength:5/5 throughout, slight weakness remains in L PF 4+  Data:  Other tests/results:   Output by Drain (mL) 02/27/23 0701 - 02/27/23 1900 02/27/23 1901 - 02/28/23 0700 02/28/23 0701 - 02/28/23 1900 02/28/23 1901 - 03/01/23 0700 03/01/23 0701 - 03/01/23 1228  Closed System Drain 1 Posterior Back Accordion (Hemovac)   15 100   Negative Pressure Wound Therapy Back Mid    0      Assessment/Plan:  [name] POD 1 s/p L3-4 laminectomy, L4-5 laminectomy and discectomy of left side, L5-S1 laminectomy.  - mobilize - pain control - DVT prophylaxis - PTOT -Monitor HV drain output  Joan Flores PA-C Department of Neurosurgery

## 2023-03-01 NOTE — Plan of Care (Signed)
Problem: Education: Goal: Ability to verbalize activity precautions or restrictions will improve Outcome: Progressing Goal: Knowledge of the prescribed therapeutic regimen will improve Outcome: Progressing   Problem: Activity: Goal: Ability to avoid complications of mobility impairment will improve Outcome: Progressing Goal: Ability to tolerate increased activity will improve Outcome: Progressing Goal: Will remain free from falls Outcome: Progressing

## 2023-03-01 NOTE — Plan of Care (Signed)
Problem: Education: Goal: Knowledge of the prescribed therapeutic regimen will improve Outcome: Progressing Goal: Understanding of discharge needs will improve Outcome: Progressing   Problem: Activity: Goal: Ability to avoid complications of mobility impairment will improve Outcome: Progressing

## 2023-03-02 MED ORDER — TRAMADOL HCL 50 MG PO TABS
50.0000 mg | ORAL_TABLET | Freq: Four times a day (QID) | ORAL | Status: DC
  Administered 2023-03-02 – 2023-03-08 (×26): 50 mg via ORAL
  Filled 2023-03-02 (×26): qty 1

## 2023-03-02 NOTE — Progress Notes (Signed)
NT assisted patient to [facility], when patient got up NT noted Hemovac to be pulled out. MD notified. Prevena remains in place.

## 2023-03-02 NOTE — Plan of Care (Signed)
  Problem: Activity: Goal: Ability to tolerate increased activity will improve Outcome: Progressing Goal: Will remain free from falls Outcome: Progressing   Problem: Activity: Goal: Will remain free from falls Outcome: Progressing

## 2023-03-02 NOTE — Progress Notes (Signed)
Physical Therapy Treatment Patient Details Name: [name] MRN: [id number] DOB: 05/04/1950 Today's Date: 03/02/2023   History of Present Illness Pt is a 73 y.o. female s/p L3-4 laminectomy, L4-5 laminectomy/discectomy L side, and L5-S1 laminectomy on 02/28/23. PMH significant for COPD, arthritis, GERD, HTN, pneumonia, carpal tunnel release, and PAD    PT Comments  Pt is progressing, ambulating 10' with RW CGA, performing bed mobility (supine> sit ) with Min A, and transfers with Min A.  Pt continues to be limited with mobility reporting severe back pain and fatigues quickly. Pt's SPO2 during activity on RA was 93-96%, RN made aware. Continued PT will assist pt towards greater activity tolerance, standing balance, and LE strength for increased safety and independence with bed mobility.   If plan is discharge home, recommend the following: A lot of help with walking and/or transfers;A lot of help with bathing/dressing/bathroom;Help with stairs or ramp for entrance;Assist for transportation   Can travel by private vehicle     Yes  Equipment Recommendations  Other (comment) (TBD)    Recommendations for Other Services       Precautions / Restrictions Precautions Precautions: Fall;Back Precaution Booklet Issued: No Restrictions Weight Bearing Restrictions Per Provider Order: No     Mobility  Bed Mobility Overal bed mobility: Needs Assistance Bed Mobility: Supine to Sit     Supine to sit: Min assist, HOB elevated, Used rails          Transfers Overall transfer level: Needs assistance Equipment used: Rolling walker (2 wheels) Transfers: Bed to chair/wheelchair/BSC, Sit to/from Stand Sit to Stand: Min assist   Step pivot transfers: Min assist            Ambulation/Gait Ambulation/Gait assistance: Contact guard assist Gait Distance (Feet): 10 Feet (x2) Assistive device: Rolling walker (2 wheels) Gait Pattern/deviations: Step-through pattern, Decreased stride length        General Gait Details: started with side stepping along EOB, after seated rest, pt ambulated around bed to recliner on other side.   Stairs             Wheelchair Mobility     Tilt Bed    Modified Rankin (Stroke Patients Only)       Balance Overall balance assessment: History of Falls, Needs assistance Sitting-balance support: Feet supported, No upper extremity supported Sitting balance-Leahy Scale: Good     Standing balance support: Reliant on assistive device for balance, Bilateral upper extremity supported, During functional activity Standing balance-Leahy Scale: Fair                              Cognition Arousal: Alert Behavior During Therapy: WFL for tasks assessed/performed Overall Cognitive Status: Within Functional Limits for tasks assessed                                          Exercises      General Comments General comments (skin integrity, edema, etc.): small wound vac and hemovac      Pertinent Vitals/Pain Pain Assessment Pain Score: 8  Pain Location: LBP Pain Descriptors / Indicators: Discomfort, Dull, Grimacing, Aching Pain Intervention(s): Limited activity within patient's tolerance, Monitored during session, Premedicated before session    Home Living  Prior Function            PT Goals (current goals can now be found in the care plan section) Acute Rehab PT Goals Patient Stated Goal: to go home PT Goal Formulation: With patient Time For Goal Achievement: 03/15/23 Potential to Achieve Goals: Good Additional Goals Additional Goal #1: Pt will be able to perform bed mobility/transfers with supervision and safe technique in order to improve functional independence Progress towards PT goals: Progressing toward goals    Frequency    7X/week      PT Plan      Co-evaluation              AM-PAC PT "6 Clicks" Mobility   Outcome Measure  Help needed  turning from your back to your side while in a flat bed without using bedrails?: A Lot Help needed moving from lying on your back to sitting on the side of a flat bed without using bedrails?: A Lot Help needed moving to and from a bed to a chair (including a wheelchair)?: A Little Help needed standing up from a chair using your arms (e.g., wheelchair or bedside chair)?: A Little Help needed to walk in hospital room?: A Little Help needed climbing 3-5 steps with a railing? : Total 6 Click Score: 14    End of Session Equipment Utilized During Treatment: Gait belt Activity Tolerance: Patient tolerated treatment well Patient left: in chair;with chair alarm set Nurse Communication: Mobility status PT Visit Diagnosis: Repeated falls (R29.6);Muscle weakness (generalized) (M62.81);Difficulty in walking, not elsewhere classified (R26.2);Pain Pain - Right/Left:  (LBP) Pain - part of body:  (back)     Time: 1137-1203 PT Time Calculation (min) (ACUTE ONLY): 26 min  Charges:    $Therapeutic Activity: 23-37 mins PT General Charges $$ ACUTE PT VISIT: 1 Visit                    Hortencia Conradi, PTA  03/02/23, 12:23 PM

## 2023-03-02 NOTE — Progress Notes (Signed)
Neurosurgery Progress Note  History: [name] is here for L3-4 laminectomy, L4-5 laminectomy and discectomy of left side, L5-S1 laminectomy.  Interval 1/25: Patient complaining of back pain this morning. HV with continued output. She had some low blood pressure last night and pain meds were held. She is doing better with IV and po fluids this morning    Hospital Course: 1/24: She is doing well POD1. She states her pain is under control.   Physical Exam: Vitals:   03/02/23 0405 03/02/23 0744  BP: (!) 101/49 (!) 110/36  Pulse: 67 75  Resp: 18 16  Temp: 97.7 F (36.5 C) 97.8 F (36.6 C)  SpO2: 96% 97%    Awake, alert HV drain in place Prevena providing adequate suction  Strength:5/5 throughout lower extremities with 4+/5 in left PF  Data:  Other tests/results:   Output by Drain (mL) 02/28/23 0701 - 02/28/23 1900 02/28/23 1901 - 03/01/23 0700 03/01/23 0701 - 03/01/23 1900 03/01/23 1901 - 03/02/23 0700 03/02/23 0701 - 03/02/23 0932  Closed System Drain 1 Posterior Back Accordion (Hemovac) 15 100 60 50   Negative Pressure Wound Therapy Back Mid  0 0       Assessment/Plan:  [name] POD 2 s/p L3-4 laminectomy, L4-5 laminectomy and discectomy of left side, L5-S1 laminectomy.  - mobilize - pain control - DVT prophylaxis - PTOT -Monitor HV drain output (110 over night 1/25)   [name], MD Department of Neurosurgery

## 2023-03-02 NOTE — Plan of Care (Signed)
  Problem: Education: Goal: Ability to verbalize activity precautions or restrictions will improve Outcome: Progressing Goal: Knowledge of the prescribed therapeutic regimen will improve Outcome: Progressing   Problem: Activity: Goal: Ability to avoid complications of mobility impairment will improve Outcome: Progressing   Problem: Education: Goal: Knowledge of General Education information will improve Description: Including pain rating scale, medication(s)/side effects and non-pharmacologic comfort measures Outcome: Progressing   Problem: Activity: Goal: Risk for activity intolerance will decrease Outcome: Progressing   Problem: Nutrition: Goal: Adequate nutrition will be maintained Outcome: Progressing   Problem: Pain Managment: Goal: General experience of comfort will improve and/or be controlled Outcome: Progressing   Problem: Safety: Goal: Ability to remain free from injury will improve Outcome: Progressing

## 2023-03-02 NOTE — Plan of Care (Signed)

## 2023-03-03 NOTE — TOC Progression Note (Signed)
Transition of Care [facility]) - Progression Note    Patient Details  Name: [name] MRN: [id number] Date of Birth: [birth year]  [facility] of [facility] CM/SW Contact  Maree Krabbe, LCSW Phone Number: 03/03/2023, 1:51 PM  Clinical Narrative:    SW notified via PT that pt wants to go to [facility]. Work up complete and referral sent.        Expected Discharge Plan and Services                                               Social Determinants of Health (SDOH) Interventions SDOH Screenings   Food Insecurity: No Food Insecurity (03/01/2023)  Housing: Low Risk  (03/01/2023)  Transportation Needs: No Transportation Needs (03/01/2023)  Utilities: Not At Risk (03/01/2023)  Alcohol Screen: Low Risk  (08/28/2022)  Depression (PHQ2-9): Low Risk  (02/08/2023)  Financial Resource Strain: Low Risk  (04/27/2022)  Physical Activity: Inactive (04/27/2022)  Social Connections: Moderately Isolated (03/01/2023)  Stress: No Stress Concern Present (04/27/2022)  Tobacco Use: Medium Risk (02/28/2023)    Readmission Risk Interventions    01/05/2023   12:33 PM  Readmission Risk Prevention Plan  Transportation Screening Complete  PCP or Specialist Appt within 3-5 Days Complete  HRI or Home Care Consult Complete  Social Work Consult for Recovery Care Planning/Counseling Complete  Palliative Care Screening Not Applicable  Medication Review Oceanographer) Complete

## 2023-03-03 NOTE — [place] FL2 (Signed)
[place] MEDICAID [id number] LEVEL OF CARE FORM     IDENTIFICATION  Patient Name: [name] Birthdate: 01/16/1951 Sex: female Admission Date (Current Location): 02/28/2023  [facility] and [place] Number:  Chiropodist and Address:  [facility], [address], [place], [place] [address]      Provider Number: [id number]  Attending Physician Name and Address:  Lovenia Kim, MD  Relative Name and Phone Number:       Current Level of Care: Hospital Recommended Level of Care: Skilled Nursing Facility Prior Approval Number:    Date Approved/Denied:   PASRR Number: [id number]  Discharge Plan: SNF    Current Diagnoses: Patient Active Problem List   Diagnosis Date Noted   Lumbar radiculopathy 02/28/2023   Spinal stenosis, lumbar region, with neurogenic claudication 02/04/2023   Lumbar disc herniation with myelopathy 02/04/2023   Psoriasis 07/26/2020   Class 1 obesity due to excess calories with serious comorbidity and body mass index (BMI) of 34.0 to 34.9 in adult 07/26/2020   COPD (chronic obstructive pulmonary disease) (HCC) 10/28/2017   Thoracic aortic atherosclerosis (HCC) 02/12/2017   Osteoarthritis 05/07/2016   Insomnia 01/10/2016   Bilateral carotid artery stenosis 08/22/2015   Benign essential HTN 08/04/2015   Mixed hyperlipidemia 08/04/2015   Atherosclerotic peripheral vascular disease with intermittent claudication (HCC) 08/04/2015   Panic disorder without agoraphobia with moderate panic attacks 07/01/2015   DM (diabetes mellitus), type 2 (HCC) 06/09/2015   Anxiety and depression 06/23/2014   Esophageal reflux 07/01/2009   Hypothyroidism 01/11/2009    Orientation RESPIRATION BLADDER Height & Weight     Self, Time, Situation, Place  O2 (2L) Continent Weight: 202 lb 9.6 oz (91.9 kg) Height:  5\' 3"  (160 cm)  BEHAVIORAL SYMPTOMS/MOOD NEUROLOGICAL BOWEL NUTRITION STATUS      Continent Diet (heart healthy/carb modified)  AMBULATORY  STATUS COMMUNICATION OF NEEDS Skin   Independent Verbally Wound Vac (insicion on back)                       Personal Care Assistance Level of Assistance  Bathing, Feeding, Dressing Bathing Assistance: Independent Feeding assistance: Limited assistance Dressing Assistance: Independent     Functional Limitations Info  Sight, Hearing, Speech Sight Info: Adequate Hearing Info: Adequate Speech Info: Adequate    SPECIAL CARE FACTORS FREQUENCY  PT (By licensed PT), OT (By licensed OT)     PT Frequency: 5x OT Frequency: 5x            Contractures Contractures Info: Not present    Additional Factors Info  Code Status, Allergies Code Status Info: full code Allergies Info: Ciprofloxacin, Gabapentin, Mucinex (Guaifenesin Er), Oxycodone, Oxycodone-acetaminophen, Trazodone And Nefazodone, Oxycodone-acetaminophen, Sulfa Antibiotics           Current Medications (03/03/2023):  This is the current hospital active medication list Current Facility-Administered Medications  Medication Dose Route Frequency Provider Last Rate Last Admin   acetaminophen (TYLENOL) tablet 650 mg  650 mg Oral Q4H PRN Joan Flores, PA-C   650 mg at 03/03/23 1300   Or   acetaminophen (TYLENOL) suppository 650 mg  650 mg Rectal Q4H PRN Joan Flores, PA-C       acetaminophen (TYLENOL) tablet 500 mg  500 mg Oral Q6H PRN Joan Flores, PA-C       albuterol (PROVENTIL) (2.5 MG/3ML) 0.083% nebulizer solution 3 mL  3 mL Inhalation Q6H PRN Joan Flores, PA-C       alum & mag hydroxide-simeth (MAALOX/MYLANTA) [phone]  MG/5ML suspension 30 mL  30 mL Oral Q6H PRN Joan Flores, PA-C       bisacodyl (DULCOLAX) EC tablet 5 mg  5 mg Oral Daily PRN Frisbie, Brooke, PA-C       carvedilol (COREG) tablet 3.125 mg  3.125 mg Oral BID WC Joan Flores, PA-C   3.125 mg at 03/03/23 1027   citalopram (CELEXA) tablet 40 mg  40 mg Oral Daily Joan Flores, PA-C   40 mg at 03/03/23 2536   clobetasol cream  (TEMOVATE) 0.05 % 1 Application  1 Application Topical PRN Joan Flores, PA-C       clonazePAM Scarlette Calico) tablet 1 mg  1 mg Oral TID PRN Joan Flores, PA-C   1 mg at 03/02/23 1516   cyclobenzaprine (FLEXERIL) tablet 10 mg  10 mg Oral TID PRN Joan Flores, PA-C   10 mg at 03/03/23 1123   diltiazem (CARDIZEM CD) 24 hr capsule 120 mg  120 mg Oral Daily Joan Flores, PA-C   120 mg at 03/03/23 6440   docusate sodium (COLACE) capsule 100 mg  100 mg Oral BID Joan Flores, PA-C   100 mg at 03/03/23 3474   doxepin (SINEQUAN) capsule 50 mg  50 mg Oral QHS Frisbie, Brooke, PA-C   50 mg at 03/02/23 2144   enoxaparin (LOVENOX) injection 30 mg  30 mg Subcutaneous Q24H Frisbie, Brooke, PA-C   30 mg at 03/03/23 2595   fenofibrate tablet 160 mg  160 mg Oral Daily Joan Flores, PA-C   160 mg at 03/03/23 0906   fluticasone furoate-vilanterol (BREO ELLIPTA) 100-25 MCG/ACT 1 puff  1 puff Inhalation Daily Joan Flores, PA-C   1 puff at 03/03/23 6387   HYDROmorphone (DILAUDID) injection 1 mg  1 mg Intravenous Q3H PRN Joan Flores, PA-C   1 mg at 03/01/23 0445   hydrOXYzine (ATARAX) tablet 25 mg  25 mg Oral QHS Frisbie, Brooke, PA-C   25 mg at 03/02/23 2144   ipratropium-albuterol (DUONEB) 0.5-2.5 (3) MG/3ML nebulizer solution 3 mL  3 mL Nebulization Q6H PRN Frisbie, Brooke, PA-C       levothyroxine (SYNTHROID) tablet 100 mcg  100 mcg Oral Daily Joan Flores, PA-C   100 mcg at 03/03/23 5643   lisinopril (ZESTRIL) tablet 10 mg  10 mg Oral Daily Joan Flores, PA-C   10 mg at 03/03/23 3295   magnesium citrate solution 1 Bottle  1 Bottle Oral Once PRN Joan Flores, PA-C       menthol-cetylpyridinium (CEPACOL) lozenge 3 mg  1 lozenge Oral PRN Joan Flores, PA-C       Or   phenol (CHLORASEPTIC) mouth spray 1 spray  1 spray Mouth/Throat PRN Joan Flores, PA-C       ondansetron [facility]) tablet 4 mg  4 mg Oral Q6H PRN Joan Flores, PA-C       Or   ondansetron [facility]) injection 4 mg  4  mg Intravenous Q6H PRN Frisbie, Brooke, PA-C       pantoprazole (PROTONIX) EC tablet 40 mg  40 mg Oral Daily Joan Flores, PA-C   40 mg at 03/03/23 1884   rosuvastatin (CRESTOR) tablet 20 mg  20 mg Oral Daily Joan Flores, PA-C   20 mg at 03/03/23 1660   senna (SENOKOT) tablet 8.6 mg  1 tablet Oral BID Joan Flores, PA-C   8.6 mg at 03/03/23 6301   senna-docusate (Senokot-S) tablet 1 tablet  1 tablet Oral QHS PRN Joan Flores, PA-C       sodium chloride flush (  NS) 0.9 % injection 3 mL  3 mL Intravenous Q12H Joan Flores, PA-C   3 mL at 03/03/23 1027   sodium chloride flush (NS) 0.9 % injection 3 mL  3 mL Intravenous PRN Frisbie, Brooke, PA-C       traMADol Janean Sark) tablet 50 mg  50 mg Oral Q6H [name], MD   50 mg at 03/03/23 2536   triamcinolone ointment (KENALOG) 0.1 % 1 Application  1 Application Topical BID Joan Flores, PA-C   1 Application at 03/03/23 6440   Vitamin D (Ergocalciferol) (DRISDOL) 1.25 MG (50000 UNIT) capsule 50,000 Units  50,000 Units Oral Once per day on Monday Thursday Joan Flores, PA-C         Discharge Medications: Please see discharge summary for a list of discharge medications.  Relevant Imaging Results:  Relevant Lab Results:   Additional Information SSN:[id number]  Reuel Boom Luria Rosario, LCSW

## 2023-03-03 NOTE — Progress Notes (Signed)
Physical Therapy Treatment Patient Details Name: [name] MRN: [id number] DOB: 1950-07-21 Today's Date: 03/03/2023   History of Present Illness Pt is a 73 y.o. female s/p L3-4 laminectomy, L4-5 laminectomy/discectomy L side, and L5-S1 laminectomy on 02/28/23. PMH significant for COPD, arthritis, GERD, HTN, pneumonia, carpal tunnel release, and PAD    PT Comments  Pt received up in chair, received ms relaxer ~1 hour prior to session. Mod/MinA to stand from recliner to RW. Gait training with progression in distance of 35 ft with use of RW and CGA. Increased LBP in standing 6/10. Pt returned to chair to complete B LE strengthening exercises in sitting. Overall great progress and tolerance for PT this date. Pt however is not at fucntional baseline and will benefit from STR prior to returning home. Pt requesting [facility], case manager notified.    If plan is discharge home, recommend the following: Help with stairs or ramp for entrance;Assist for transportation;A little help with walking and/or transfers;A little help with bathing/dressing/bathroom;Assistance with cooking/housework   Can travel by private vehicle     Yes  Equipment Recommendations  Other (comment) (TBD at next level of care)    Recommendations for Other Services       Precautions / Restrictions Precautions Precautions: Fall;Back Precaution Booklet Issued: No (Educated pt) Restrictions Weight Bearing Restrictions Per Provider Order: No     Mobility  Bed Mobility               General bed mobility comments:  (NT pt in chair pre/post session)    Transfers Overall transfer level: Needs assistance Equipment used: Rolling walker (2 wheels) Transfers: Sit to/from Stand Sit to Stand: Min assist, Mod assist (from recliner)                Ambulation/Gait Ambulation/Gait assistance: Contact guard assist Gait Distance (Feet): 35 Feet Assistive device: Rolling walker (2 wheels) Gait  Pattern/deviations: Step-through pattern, Decreased stride length Gait velocity: decr     General Gait Details: Slow cautious gait, quick to fatigue, increased LBP in standing   Stairs             Wheelchair Mobility     Tilt Bed    Modified Rankin (Stroke Patients Only)       Balance Overall balance assessment: History of Falls, Needs assistance Sitting-balance support: Feet supported, No upper extremity supported Sitting balance-Leahy Scale: Good     Standing balance support: Reliant on assistive device for balance, Bilateral upper extremity supported, During functional activity Standing balance-Leahy Scale: Fair                              Cognition Arousal: Alert Behavior During Therapy: WFL for tasks assessed/performed Overall Cognitive Status: Within Functional Limits for tasks assessed                                 General Comments: A&Ox4        Exercises General Exercises - Lower Extremity Ankle Circles/Pumps: AROM, Both, 20 reps, Seated Long Arc Quad: AROM, Both, 10 reps, Seated Hip ABduction/ADduction: AROM, Both, 10 reps, Seated Hip Flexion/Marching: AROM, Both, 10 reps, Seated    General Comments General comments (skin integrity, edema, etc.): lower back portable wound vac intact      Pertinent Vitals/Pain Pain Assessment Pain Assessment: 0-10 Pain Score: 8  Pain Location: LBP Pain Descriptors / Indicators:  Discomfort, Dull, Grimacing, Aching Pain Intervention(s): Patient requesting pain meds-RN notified    Home Living                          Prior Function            PT Goals (current goals can now be found in the care plan section) Acute Rehab PT Goals Patient Stated Goal: to go home Progress towards PT goals: Progressing toward goals    Frequency    7X/week      PT Plan      Co-evaluation              AM-PAC PT "6 Clicks" Mobility   Outcome Measure  Help needed  turning from your back to your side while in a flat bed without using bedrails?: A Lot Help needed moving from lying on your back to sitting on the side of a flat bed without using bedrails?: A Lot Help needed moving to and from a bed to a chair (including a wheelchair)?: A Little Help needed standing up from a chair using your arms (e.g., wheelchair or bedside chair)?: A Little Help needed to walk in hospital room?: A Little Help needed climbing 3-5 steps with a railing? : Total 6 Click Score: 14    End of Session Equipment Utilized During Treatment: Gait belt Activity Tolerance: Patient tolerated treatment well Patient left: in chair;with chair alarm set Nurse Communication: Mobility status PT Visit Diagnosis: Repeated falls (R29.6);Muscle weakness (generalized) (M62.81);Difficulty in walking, not elsewhere classified (R26.2);Pain Pain - part of body:  (back)     Time: 9629-5284 PT Time Calculation (min) (ACUTE ONLY): 26 min  Charges:    $Gait Training: 8-22 mins $Therapeutic Exercise: 8-22 mins PT General Charges $$ ACUTE PT VISIT: 1 Visit                    Zadie Cleverly, PTA  Jannet Askew 03/03/2023, 12:58 PM

## 2023-03-03 NOTE — TOC Initial Note (Signed)
Transition of Care [facility]) - Initial/Assessment Note    Patient Details  Name: [name] MRN: [id number] Date of Birth: Nov 18, 1950  [facility]) CM/SW Contact:    Maree Krabbe, LCSW Phone Number: 03/03/2023, 2:06 PM  Clinical Narrative:  SW confirmed with pt that she would like to go to [facility]. Referral sent.                 Expected Discharge Plan: Skilled Nursing Facility Barriers to Discharge: Continued Medical Work up   Patient Goals and CMS Choice Patient states their goals for this hospitalization and ongoing recovery are:: to get better- pt is in pain   Choice offered to / list presented to : Patient      Expected Discharge Plan and Services In-house Referral: Clinical Social Work   Post Acute Care Choice: Skilled Nursing Facility Living arrangements for the past 2 months: Single Family Home                                      Prior Living Arrangements/Services Living arrangements for the past 2 months: Single Family Home Lives with:: Self Patient language and need for interpreter reviewed:: Yes Do you feel safe going back to the place where you live?: Yes      Need for Family Participation in Patient Care: Yes (Comment) Care giver support system in place?: Yes (comment)   Criminal Activity/Legal Involvement Pertinent to Current Situation/Hospitalization: No - Comment as needed  Activities of Daily Living   ADL Screening (condition at time of admission) Independently performs ADLs?: Yes (appropriate for developmental age) Is the patient deaf or have difficulty hearing?: No Does the patient have difficulty seeing, even when wearing glasses/contacts?: No Does the patient have difficulty concentrating, remembering, or making decisions?: No  Permission Sought/Granted Permission sought to share information with : Family Supports Permission granted to share information with : Yes, Verbal Permission Granted  Share Information with  NAME: Chrissie Noa  Permission granted to share info w AGENCY: [facility]  Permission granted to share info w Relationship: son     Emotional Assessment Appearance:: Appears stated age Attitude/Demeanor/Rapport: Engaged Affect (typically observed): Accepting Orientation: : Oriented to Self, Oriented to Place, Oriented to  Time, Oriented to Situation Alcohol / Substance Use: Not Applicable Psych Involvement: No (comment)  Admission diagnosis:  Lumbar radiculopathy [M54.16] Patient Active Problem List   Diagnosis Date Noted   Lumbar radiculopathy 02/28/2023   Spinal stenosis, lumbar region, with neurogenic claudication 02/04/2023   Lumbar disc herniation with myelopathy 02/04/2023   Psoriasis 07/26/2020   Class 1 obesity due to excess calories with serious comorbidity and body mass index (BMI) of 34.0 to 34.9 in adult 07/26/2020   COPD (chronic obstructive pulmonary disease) (HCC) 10/28/2017   Thoracic aortic atherosclerosis (HCC) 02/12/2017   Osteoarthritis 05/07/2016   Insomnia 01/10/2016   Bilateral carotid artery stenosis 08/22/2015   Benign essential HTN 08/04/2015   Mixed hyperlipidemia 08/04/2015   Atherosclerotic peripheral vascular disease with intermittent claudication (HCC) 08/04/2015   Panic disorder without agoraphobia with moderate panic attacks 07/01/2015   DM (diabetes mellitus), type 2 (HCC) 06/09/2015   Anxiety and depression 06/23/2014   Esophageal reflux 07/01/2009   Hypothyroidism 01/11/2009   PCP:  Lorre Munroe, NP Pharmacy:   [id number] - [place], [place] - [address] [place] [address] Phone: [phone] Fax: [phone]  [facility] [id number] [place], [place] - [address] [place] [address] Phone: [phone] Fax: [phone]     Social Drivers of Health (SDOH) Social History: SDOH Screenings   Food Insecurity: No Food  Insecurity (03/01/2023)  Housing: Low Risk  (03/01/2023)  Transportation Needs: No Transportation Needs (03/01/2023)  Utilities: Not At Risk (03/01/2023)  Alcohol Screen: Low Risk  (08/28/2022)  Depression (PHQ2-9): Low Risk  (02/08/2023)  Financial Resource Strain: Low Risk  (04/27/2022)  Physical Activity: Inactive (04/27/2022)  Social Connections: Moderately Isolated (03/01/2023)  Stress: No Stress Concern Present (04/27/2022)  Tobacco Use: Medium Risk (02/28/2023)   SDOH Interventions:     Readmission Risk Interventions    01/05/2023   12:33 PM  Readmission Risk Prevention Plan  Transportation Screening Complete  PCP or Specialist Appt within 3-5 Days Complete  HRI or Home Care Consult Complete  Social Work Consult for Recovery Care Planning/Counseling Complete  Palliative Care Screening Not Applicable  Medication Review Oceanographer) Complete

## 2023-03-03 NOTE — Progress Notes (Signed)
Neurosurgery Progress Note  History: [name] is here for L3-4 laminectomy, L4-5 laminectomy and discectomy of left side, L5-S1 laminectomy.  Interval 1/26: Patient is postop day 3 from her decompression.  Today she states the back pain is more tolerable and the medication is helping.  Her Hemovac was intermittently removed when she was standing yesterday.  Therapy has worked with her.  She is tolerating a diet.    Hospital Course: 1/24: She is doing well POD1. She states her pain is under control.  1/25: Patient complaining of back pain this morning. HV with continued output. She had some low blood pressure last night and pain meds were held. She is doing better with IV and po fluids this morning Physical Exam: Vitals:   03/03/23 0238 03/03/23 0827  BP: 112/64 134/87  Pulse: 76 76  Resp: 17 16  Temp: 97.9 F (36.6 C) 98.3 F (36.8 C)  SpO2: 95% 98%    Awake, alert HV drain in place Prevena providing adequate suction  Strength:5/5 throughout lower extremities with 4+/5 in left PF  Data:  Other tests/results:   Output by Drain (mL) 03/01/23 0701 - 03/01/23 1900 03/01/23 1901 - 03/02/23 0700 03/02/23 0701 - 03/02/23 1900 03/02/23 1901 - 03/03/23 0700 03/03/23 0701 - 03/03/23 1011  Closed System Drain 1 Posterior Back Accordion (Hemovac) 60 50 20    Negative Pressure Wound Therapy Back Mid 0  0       Assessment/Plan:  [name] POD 2 s/p L3-4 laminectomy, L4-5 laminectomy and discectomy of left side, L5-S1 laminectomy.  - mobilize - pain control with oral medications - DVT prophylaxis - PTOT -continue and will need evaluation for possible rehab placement -Hemovac removed 1/25  Jill Chris, MD Department of Neurosurgery

## 2023-03-04 MED ORDER — CLONAZEPAM 1 MG PO TABS: ORAL_TABLET | ORAL | 0 refills | Status: DC

## 2023-03-04 MED ORDER — CYCLOBENZAPRINE HCL 10 MG PO TABS: 10.0000 mg | ORAL_TABLET | Freq: Three times a day (TID) | ORAL | Status: DC | PRN

## 2023-03-04 MED ORDER — SENNA 8.6 MG PO TABS: 1.0000 | ORAL_TABLET | Freq: Two times a day (BID) | ORAL | Status: DC | PRN

## 2023-03-04 MED ORDER — TRAMADOL HCL 50 MG PO TABS: 50.0000 mg | ORAL_TABLET | Freq: Four times a day (QID) | ORAL | 0 refills | Status: DC

## 2023-03-04 NOTE — Discharge Summary (Cosign Needed Addendum)
Discharge Summary  Patient ID: [name] MRN: [id number] DOB/AGE: 1950-06-20 73 y.o.  Admit date: 02/28/2023 Discharge date: 03/08/2023  Admission Diagnoses: Spinal stenosis, lumbar region, with neurogenic claudication  M51.06 Lumbar disc herniation   Discharge Diagnoses:  Principal Problem:   Lumbar radiculopathy Active Problems:   Spinal stenosis, lumbar region, with neurogenic claudication   Lumbar disc herniation with myelopathy   Discharged Condition: good  Hospital Course:  [name] is a 73 y.o presenting with symptomatic lumbar stenosis with neurogenic claudication s/p L3-4 laminectomy, left L4-5 laminectomy and discectomy, and L5-S1 laminectomy. Her intraoperative course was uncomplicated. She was admitted for drain output monitoring, pain control, and therapy evaluation. Her postoperative course was complicated by some hypotension on POD2 which improved with holding her home blood pressure medications and IV fluids.  Her hemovac drain was dislodged accidentally on POD3 without further complications. She was seen and evaluated by therapy and deemed appropriate for discharge to SNF however in the process of obtaining a bed offer and insurance authorization, she was denied. She was discharged home with [facility] on POD 8 with prescriptions for Tramadol, Hydromorphone, Flexeril, Senna and a MDP  Consults: None  Significant Diagnostic Studies: NA  Treatments: surgery: as above. Please see separately dictated operative report for further details   Discharge Exam: Blood pressure 128/66, pulse 86, temperature 98 F (36.7 C), resp. rate 16, height 5\' 3"  (1.6 m), weight 91.9 kg, SpO2 93%. Awake, alert   Strength:5/5 throughout lower extremities with 4+/5 in left PF Incisions c/d/I with staples in place.  Disposition: Discharge disposition: 06-Home-Health Care Svc       Discharge Instructions     Incentive spirometry RT   Complete by: As directed    No wound care    Complete by: As directed       Allergies as of 03/08/2023       Reactions   Ciprofloxacin Hives, Diarrhea   Gabapentin Other (See Comments)   hallucination   Mucinex [guaifenesin Er] Nausea Only   Per pt   Oxycodone Diarrhea   Oxycodone-acetaminophen Diarrhea   Trazodone And Nefazodone Other (See Comments)   Patient stated - "medication makes me "loopy" and "out of my mind"   Oxycodone-acetaminophen Palpitations   Sulfa Antibiotics Rash   Throat, mouth Throat, mouth        Medication List     TAKE these medications    acetaminophen 500 MG tablet Commonly known as: TYLENOL Take 500 mg by mouth every 6 (six) hours as needed for moderate pain.   albuterol 108 (90 Base) MCG/ACT inhaler Commonly known as: VENTOLIN HFA INHALE 2 PUFFS BY MOUTH EVERY 6 HOURS AS NEEDED FOR WHEEZING OR SHORTNESS OF BREATH   Aspirin Low Dose 81 MG tablet Generic drug: aspirin EC TAKE 1 TABLET(81 MG) BY MOUTH DAILY. SWALLOW WHOLE   carvedilol 3.125 MG tablet Commonly known as: COREG TAKE 1 TABLET BY MOUTH TWICE DAILY WITH A MEAL   citalopram 40 MG tablet Commonly known as: CELEXA TAKE 1 TABLET(40 MG) BY MOUTH DAILY   clobetasol cream 0.05 % Commonly known as: TEMOVATE Apply 1 Application topically as needed.   clonazePAM 1 MG tablet Commonly known as: KLONOPIN TAKE 1 TABLET BY MOUTH EVERY MORNING, 1/2 TABLET EVERY AFTERNOON AND 1 TABLET EVERY NIGHT AT BEDTIME   cyclobenzaprine 10 MG tablet Commonly known as: FLEXERIL Take 1 tablet (10 mg total) by mouth 3 (three) times daily as needed for muscle spasms.   diltiazem 120 MG 24 hr  capsule Commonly known as: CARDIZEM CD TAKE 1 CAPSULE BY MOUTH EVERY DAY   doxepin 25 MG capsule Commonly known as: SINEQUAN Take 50 mg by mouth at bedtime.   fenofibrate 145 MG tablet Commonly known as: TRICOR Take 1 tablet (145 mg total) by mouth daily.   fluticasone furoate-vilanterol 100-25 MCG/ACT Aepb Commonly known as: BREO ELLIPTA Inhale 1  puff into the lungs daily.   HYDROmorphone 2 MG tablet Commonly known as: DILAUDID Take 0.5 tablets (1 mg total) by mouth 3 (three) times daily as needed for severe pain (pain score 7-10) (pain unrelieved by Tramadol).   hydrOXYzine 25 MG tablet Commonly known as: ATARAX Take 25 mg by mouth at bedtime.   ipratropium-albuterol 0.5-2.5 (3) MG/3ML Soln Commonly known as: DUONEB Take 3 mLs by nebulization every 6 (six) hours as needed (wheezing / shortness of breath).   levothyroxine 100 MCG tablet Commonly known as: SYNTHROID Take 1 tablet (100 mcg total) by mouth daily.   lisinopril 10 MG tablet Commonly known as: ZESTRIL TAKE 1 TABLET(10 MG) BY MOUTH DAILY   methylPREDNISolone 4 MG Tbpk tablet Commonly known as: MEDROL DOSEPAK Take 6 tablets (24 mg total) by mouth daily for 1 day, THEN 5 tablets (20 mg total) daily for 1 day, THEN 4 tablets (16 mg total) daily for 1 day, THEN 3 tablets (12 mg total) daily for 1 day, THEN 2 tablets (8 mg total) daily for 1 day, THEN 1 tablet (4 mg total) daily for 1 day. Start taking on: March 08, 2023   pantoprazole 40 MG tablet Commonly known as: PROTONIX SMARTSIG:1 Tablet(s) By Mouth Every Evening   Quad Cane/Small Base Misc 1 Device by Does not apply route daily.   rosuvastatin 20 MG tablet Commonly known as: Crestor Take 1 tablet (20 mg total) by mouth daily.   senna 8.6 MG Tabs tablet Commonly known as: SENOKOT Take 1 tablet (8.6 mg total) by mouth 2 (two) times daily as needed for mild constipation.   traMADol 50 MG tablet Commonly known as: ULTRAM Take 1 tablet (50 mg total) by mouth every 6 (six) hours.   triamcinolone ointment 0.1 % Commonly known as: KENALOG Apply 1 Application topically as needed.   Vitamin D (Ergocalciferol) 1.25 MG (50000 UNIT) Caps capsule Commonly known as: DRISDOL Take 50,000 Units by mouth 2 (two) times a week.         Follow-up Information     Joan Flores, [place]. Go on 03/13/2023.    Specialty: Physician Assistant Why: Appt @ 10 am Contact information: [address] [place], [place] [address] [place] [address] [phone]                 Signed: Susanne Borders 03/08/2023, 11:11 AM

## 2023-03-04 NOTE — Plan of Care (Signed)

## 2023-03-04 NOTE — Progress Notes (Signed)
Occupational Therapy Treatment Patient Details Name: [name] MRN: [id number] DOB: 09-26-1950 Today's Date: 03/04/2023   History of present illness Pt is a 73 y.o. female s/p L3-4 laminectomy, L4-5 laminectomy/discectomy L side, and L5-S1 laminectomy on 02/28/23. PMH significant for COPD, arthritis, GERD, HTN, pneumonia, carpal tunnel release, and PAD   OT comments  [name] was seen for OT treatment on this date. Upon arrival to room pt seated EOB, agreeable to tx. Pt requires SUPERVISION + RW for toilet t/f and hand washing standing sink side. MIN A don B socks. Educated on back precautions. Pt making good progress toward goals, will continue to follow POC. Discharge recommendation remains appropriate.       If plan is discharge home, recommend the following:  A little help with walking and/or transfers;A lot of help with bathing/dressing/bathroom;Assist for transportation;Assistance with cooking/housework   Equipment Recommendations  None recommended by OT    Recommendations for Other Services      Precautions / Restrictions Precautions Precautions: Fall;Back Restrictions Weight Bearing Restrictions Per Provider Order: No       Mobility Bed Mobility               General bed mobility comments: not tested    Transfers Overall transfer level: Needs assistance Equipment used: Rolling walker (2 wheels) Transfers: Sit to/from Stand Sit to Stand: Supervision                 Balance Overall balance assessment: History of Falls, Needs assistance Sitting-balance support: Feet supported, No upper extremity supported Sitting balance-Leahy Scale: Good     Standing balance support: No upper extremity supported, During functional activity Standing balance-Leahy Scale: Fair                             ADL either performed or assessed with clinical judgement   ADL Overall ADL's : Needs assistance/impaired                                        General ADL Comments: SUPERVISION + RW for toilet t/f and hand washing standing sink side. MIN A don B socks. Educated on back precautions.      Cognition Arousal: Alert Behavior During Therapy: WFL for tasks assessed/performed Overall Cognitive Status: Within Functional Limits for tasks assessed                                                     Pertinent Vitals/ Pain       Pain Assessment Pain Assessment: 0-10 Pain Score: 8  Pain Location: LBP Pain Descriptors / Indicators: Discomfort, Dull, Grimacing, Aching   Frequency  Min 1X/week        Progress Toward Goals  OT Goals(current goals can now be found in the care plan section)  Progress towards OT goals: Progressing toward goals  Acute Rehab OT Goals OT Goal Formulation: With patient Time For Goal Achievement: 03/15/23 Potential to Achieve Goals: Good ADL Goals Pt Will Perform Lower Body Bathing: sit to/from stand;sitting/lateral leans;with adaptive equipment;with min assist Pt Will Perform Lower Body Dressing: with min assist;with adaptive equipment;sit to/from stand;sitting/lateral leans Pt Will Transfer to Toilet: ambulating;bedside commode;grab bars;with min assist Pt Will  Perform Toileting - Clothing Manipulation and hygiene: with min assist;with adaptive equipment;sitting/lateral leans  Plan      Co-evaluation                 AM-PAC OT "6 Clicks" Daily Activity     Outcome Measure   Help from another person eating meals?: None Help from another person taking care of personal grooming?: None Help from another person toileting, which includes using toliet, bedpan, or urinal?: A Lot Help from another person bathing (including washing, rinsing, drying)?: A Lot Help from another person to put on and taking off regular upper body clothing?: A Little Help from another person to put on and taking off regular lower body clothing?: A Lot 6 Click Score: 17    End of Session     OT Visit Diagnosis: Unsteadiness on feet (R26.81);History of falling (Z91.81);Other abnormalities of gait and mobility (R26.89);Repeated falls (R29.6);Pain   Activity Tolerance Patient tolerated treatment well;No increased pain   Patient Left in bed;with call bell/phone within reach;with bed alarm set   Nurse Communication          Time: 1610-9604 OT Time Calculation (min): 9 min  Charges: OT General Charges $OT Visit: 1 Visit OT Treatments $Self Care/Home Management : 8-22 mins  Kathie Dike, M.S. OTR/L  03/04/23, 4:11 PM  ascom [phone]

## 2023-03-04 NOTE — Plan of Care (Signed)
Problem: Education: Goal: Ability to verbalize activity precautions or restrictions will improve 03/04/2023 1107 by Faustino Congress, RN Outcome: Progressing 03/04/2023 0713 by Faustino Congress, RN Outcome: Progressing Goal: Knowledge of the prescribed therapeutic regimen will improve 03/04/2023 1107 by Faustino Congress, RN Outcome: Progressing 03/04/2023 0713 by Faustino Congress, RN Outcome: Progressing Goal: Understanding of discharge needs will improve 03/04/2023 1107 by Faustino Congress, RN Outcome: Progressing 03/04/2023 0713 by Faustino Congress, RN Outcome: Progressing   Problem: Activity: Goal: Ability to avoid complications of mobility impairment will improve 03/04/2023 1107 by Faustino Congress, RN Outcome: Progressing 03/04/2023 0713 by Faustino Congress, RN Outcome: Progressing Goal: Ability to tolerate increased activity will improve 03/04/2023 1107 by Faustino Congress, RN Outcome: Progressing 03/04/2023 0713 by Faustino Congress, RN Outcome: Progressing Goal: Will remain free from falls 03/04/2023 1107 by Faustino Congress, RN Outcome: Progressing 03/04/2023 0713 by Faustino Congress, RN Outcome: Progressing   Problem: Bowel/Gastric: Goal: Gastrointestinal status for postoperative course will improve 03/04/2023 1107 by Faustino Congress, RN Outcome: Progressing 03/04/2023 0713 by Faustino Congress, RN Outcome: Progressing   Problem: Clinical Measurements: Goal: Ability to maintain clinical measurements within normal limits will improve 03/04/2023 1107 by Faustino Congress, RN Outcome: Progressing 03/04/2023 0713 by Faustino Congress, RN Outcome: Progressing Goal: Postoperative complications will be avoided or minimized 03/04/2023 1107 by Faustino Congress, RN Outcome: Progressing 03/04/2023 0713 by Faustino Congress, RN Outcome: Progressing Goal: Diagnostic test results will improve 03/04/2023 1107 by Faustino Congress, RN Outcome: Progressing 03/04/2023 0713 by Faustino Congress, RN Outcome: Progressing   Problem:  Pain Management: Goal: Pain level will decrease 03/04/2023 1107 by Faustino Congress, RN Outcome: Progressing 03/04/2023 0713 by Faustino Congress, RN Outcome: Progressing   Problem: Skin Integrity: Goal: Will show signs of wound healing 03/04/2023 1107 by Faustino Congress, RN Outcome: Progressing 03/04/2023 0713 by Faustino Congress, RN Outcome: Progressing   Problem: Health Behavior/Discharge Planning: Goal: Identification of resources available to assist in meeting health care needs will improve 03/04/2023 1107 by Faustino Congress, RN Outcome: Progressing 03/04/2023 0713 by Faustino Congress, RN Outcome: Progressing   Problem: Bladder/Genitourinary: Goal: Urinary functional status for postoperative course will improve 03/04/2023 1107 by Faustino Congress, RN Outcome: Progressing 03/04/2023 0713 by Faustino Congress, RN Outcome: Progressing   Problem: Education: Goal: Knowledge of General Education information will improve Description: Including pain rating scale, medication(s)/side effects and non-pharmacologic comfort measures 03/04/2023 1107 by Faustino Congress, RN Outcome: Progressing 03/04/2023 0713 by Faustino Congress, RN Outcome: Progressing   Problem: Health Behavior/Discharge Planning: Goal: Ability to manage health-related needs will improve 03/04/2023 1107 by Faustino Congress, RN Outcome: Progressing 03/04/2023 0713 by Faustino Congress, RN Outcome: Progressing   Problem: Clinical Measurements: Goal: Ability to maintain clinical measurements within normal limits will improve 03/04/2023 1107 by Faustino Congress, RN Outcome: Progressing 03/04/2023 0713 by Faustino Congress, RN Outcome: Progressing Goal: Will remain free from infection 03/04/2023 1107 by Faustino Congress, RN Outcome: Progressing 03/04/2023 0713 by Faustino Congress, RN Outcome: Progressing Goal: Diagnostic test results will improve 03/04/2023 1107 by Faustino Congress, RN Outcome: Progressing 03/04/2023 0713 by Faustino Congress, RN Outcome:  Progressing Goal: Respiratory complications will improve 03/04/2023 1107 by Faustino Congress, RN Outcome: Progressing 03/04/2023 0713 by Faustino Congress, RN Outcome: Progressing Goal: Cardiovascular complication will be avoided 03/04/2023 1107 by Faustino Congress, RN Outcome: Progressing  03/04/2023 0713 by Faustino Congress, RN Outcome: Progressing   Problem: Activity: Goal: Risk for activity intolerance will decrease 03/04/2023 1107 by Faustino Congress, RN Outcome: Progressing 03/04/2023 0713 by Faustino Congress, RN Outcome: Progressing   Problem: Nutrition: Goal: Adequate nutrition will be maintained 03/04/2023 1107 by Faustino Congress, RN Outcome: Progressing 03/04/2023 0713 by Faustino Congress, RN Outcome: Progressing   Problem: Coping: Goal: Level of anxiety will decrease 03/04/2023 1107 by Faustino Congress, RN Outcome: Progressing 03/04/2023 0713 by Faustino Congress, RN Outcome: Progressing   Problem: Elimination: Goal: Will not experience complications related to bowel motility 03/04/2023 1107 by Faustino Congress, RN Outcome: Progressing 03/04/2023 0713 by Faustino Congress, RN Outcome: Progressing Goal: Will not experience complications related to urinary retention 03/04/2023 1107 by Faustino Congress, RN Outcome: Progressing 03/04/2023 0713 by Faustino Congress, RN Outcome: Progressing   Problem: Pain Managment: Goal: General experience of comfort will improve and/or be controlled 03/04/2023 1107 by Faustino Congress, RN Outcome: Progressing 03/04/2023 0713 by Faustino Congress, RN Outcome: Progressing   Problem: Safety: Goal: Ability to remain free from injury will improve 03/04/2023 1107 by Faustino Congress, RN Outcome: Progressing 03/04/2023 0713 by Faustino Congress, RN Outcome: Progressing   Problem: Skin Integrity: Goal: Risk for impaired skin integrity will decrease 03/04/2023 1107 by Faustino Congress, RN Outcome: Progressing 03/04/2023 0713 by Faustino Congress, RN Outcome: Progressing

## 2023-03-04 NOTE — Progress Notes (Signed)
Neurosurgery Progress Note  History: [name] is here for L3-4 laminectomy, L4-5 laminectomy and discectomy of left side, L5-S1 laminectomy.  1/27 (POD4): pt doing well this morning with some continued numbness in her left foot.  Interval 1/26: Patient is postop day 3 from her decompression.  Today she states the back pain is more tolerable and the medication is helping.  Her Hemovac was intermittently removed when she was standing yesterday.  Therapy has worked with her.  She is tolerating a diet.   Hospital Course: 1/24: She is doing well POD1. She states her pain is under control.  1/25: Patient complaining of back pain this morning. HV with continued output. She had some low blood pressure last night and pain meds were held. She is doing better with IV and po fluids this morning Physical Exam: Vitals:   03/03/23 0827 03/03/23 1800  BP: 134/87 (!) 98/50  Pulse: 76 66  Resp: 16 16  Temp: 98.3 F (36.8 C) 97.8 F (36.6 C)  SpO2: 98% 100%    Awake, alert  Strength:5/5 throughout lower extremities with 4+/5 in left PF Incisions c/d/I with staples in place   Data:  Other tests/results:   Output by Drain (mL) 03/02/23 0701 - 03/02/23 1900 03/02/23 1901 - 03/03/23 0700 03/03/23 0701 - 03/03/23 1900 03/03/23 1901 - 03/04/23 0648  Closed System Drain 1 Posterior Back Accordion (Hemovac) 20     Negative Pressure Wound Therapy Back Mid 0        Assessment/Plan:  [name] POD 2 s/p L3-4 laminectomy, L4-5 laminectomy and discectomy of left side, L5-S1 laminectomy.  - mobilize - pain control with oral medications - DVT prophylaxis - PTOT; plans for d/c to [facility]  -Hemovac removed 1/25 - wound vac removed this morning and replaced with clean dressing  Manning Charity PA-C Department of Neurosurgery

## 2023-03-04 NOTE — Plan of Care (Signed)

## 2023-03-04 NOTE — Discharge Instructions (Signed)
Your surgeon has performed an operation on your lumbar spine (low back) to relieve pressure on one or more nerves. Many times, patients feel better immediately after surgery and can "overdo it." Even if you feel well, it is important that you follow these activity guidelines. If you do not let your back heal properly from the surgery, you can increase the chance of a disc herniation and/or return of your symptoms. The following are instructions to help in your recovery once you have been discharged from the hospital.  * It is ok to take NSAIDs after surgery.  Activity    No bending, lifting, or twisting ("BLT"). Avoid lifting objects heavier than 10 pounds (gallon milk jug).  Where possible, avoid household activities that involve lifting, bending, pushing, or pulling such as laundry, vacuuming, grocery shopping, and childcare. Try to arrange for help from friends and family for these activities while your back heals.  Increase physical activity slowly as tolerated.  Taking short walks is encouraged, but avoid strenuous exercise. Do not jog, run, bicycle, lift weights, or participate in any other exercises unless specifically allowed by your doctor. Avoid prolonged sitting, including car rides.  Talk to your doctor before resuming sexual activity.  You should not drive until cleared by your doctor.  Until released by your doctor, you should not return to work or school.  You should rest at home and let your body heal.   You may shower three days after your surgery.  After showering, lightly dab your incision dry. Do not take a tub bath or go swimming for 3 weeks, or until approved by your doctor at your follow-up appointment.  If you smoke, we strongly recommend that you quit.  Smoking has been proven to interfere with normal healing in your back and will dramatically reduce the success rate of your surgery. Please contact [facility] (800-QUIT-NOW) and use the resources at www.[facility].com for  assistance in stopping smoking.  Surgical Incision   If you have a dressing on your incision, you may remove it three days after your surgery. Keep your incision area clean and dry.  If you have staples or stitches on your incision, you should have a follow up scheduled for removal. If you do not have staples or stitches, you will have steri-strips (small pieces of surgical tape) or Dermabond glue. The steri-strips/glue should begin to peel away within about a week (it is fine if the steri-strips fall off before then). If the strips are still in place one week after your surgery, you may gently remove them.  Diet            You may return to your usual diet. Be sure to stay hydrated.  When to Contact us  Although your surgery and recovery will likely be uneventful, you may have some residual numbness, aches, and pains in your back and/or legs. This is normal and should improve in the next few weeks.  However, should you experience any of the following, contact us immediately: New numbness or weakness Pain that is progressively getting worse, and is not relieved by your pain medications or rest Bleeding, redness, swelling, pain, or drainage from surgical incision Chills or flu-like symptoms Fever greater than 101.0 F (38.3 C) Problems with bowel or bladder functions Difficulty breathing or shortness of breath Warmth, tenderness, or swelling in your calf  Contact Information How to contact us:  If you have any questions/concerns before or after surgery, you can reach [name] at [phone], or you can  send a [employer]. We can be reached by phone or mychart 8am-4pm, Monday-Friday.  *Please note: Calls after 4pm are forwarded to a third party answering service. Mychart messages are not routinely monitored during evenings, weekends, and holidays. Please call our office to contact the answering service for urgent concerns during non-business hours.

## 2023-03-04 NOTE — Progress Notes (Signed)
Physical Therapy Treatment Patient Details Name: [name] MRN: [id number] DOB: 17-Feb-1950 Today's Date: 03/04/2023   History of Present Illness Pt is a 73 y.o. female s/p L3-4 laminectomy, L4-5 laminectomy/discectomy L side, and L5-S1 laminectomy on 02/28/23. PMH significant for COPD, arthritis, GERD, HTN, pneumonia, carpal tunnel release, and PAD    PT Comments  Pt sat EOB for 30+ minutes while eating lunch. Good demonstration with functional progression this date increasing gait distance with RW to 150' with CGA. Pt able to maneuver self and RW in room around objects and safely turn in tight spaces to access BSC. Transfers have improved to primarily CGA with occasional MinA from lower surfaces. Overall she has made excellent progress and will continue to make gaines at [facility] prior to returning home. Continue PT per POC.    If plan is discharge home, recommend the following: Help with stairs or ramp for entrance;Assist for transportation;A little help with walking and/or transfers;A little help with bathing/dressing/bathroom;Assistance with cooking/housework   Can travel by private vehicle     Yes  Equipment Recommendations  Other (comment)    Recommendations for Other Services       Precautions / Restrictions Precautions Precautions: Fall;Back Precaution Booklet Issued: No Restrictions Weight Bearing Restrictions Per Provider Order: No     Mobility  Bed Mobility Overal bed mobility: Needs Assistance Bed Mobility: Supine to Sit     Supine to sit: Min assist, HOB elevated, Used rails   Sit to sidelying: Mod assist General bed mobility comments: Improving, however still relies heavily on upper body strength    Transfers Overall transfer level: Needs assistance Equipment used: Rolling walker (2 wheels) Transfers: Sit to/from Stand Sit to Stand: Min assist, Contact guard assist           General transfer comment: Pt able to raise from bed and BSC with CGA primarily to  RW    Ambulation/Gait Ambulation/Gait assistance: Contact guard assist Gait Distance (Feet): 150 Feet Assistive device: Rolling walker (2 wheels) Gait Pattern/deviations: Step-through pattern, Decreased stride length Gait velocity: decr     General Gait Details: Slow cautious gait, increased LBP in standing   Stairs             Wheelchair Mobility     Tilt Bed    Modified Rankin (Stroke Patients Only)       Balance Overall balance assessment: History of Falls, Needs assistance Sitting-balance support: Feet supported, No upper extremity supported Sitting balance-Leahy Scale: Good     Standing balance support: Reliant on assistive device for balance, Bilateral upper extremity supported, During functional activity Standing balance-Leahy Scale: Fair                              Cognition Arousal: Alert Behavior During Therapy: WFL for tasks assessed/performed Overall Cognitive Status: Within Functional Limits for tasks assessed                                 General Comments: A&Ox4        Exercises General Exercises - Lower Extremity Ankle Circles/Pumps: AROM, Both, 20 reps, Seated Long Arc Quad: AROM, Both, 10 reps, Seated Hip ABduction/ADduction: AROM, Both, 10 reps, Seated Hip Flexion/Marching: AROM, Both, 10 reps, Seated    General Comments General comments (skin integrity, edema, etc.): Hemovac removed and replaced with honeycomb dressing this am.  Pertinent Vitals/Pain Pain Assessment Pain Assessment: 0-10 Pain Score: 8  Pain Location: LBP Pain Descriptors / Indicators: Discomfort, Dull, Grimacing, Aching Pain Intervention(s): Premedicated before session, Patient requesting pain meds-RN notified    Home Living                          Prior Function            PT Goals (current goals can now be found in the care plan section) Acute Rehab PT Goals Patient Stated Goal: to go home Progress towards  PT goals: Progressing toward goals    Frequency    7X/week      PT Plan      Co-evaluation              AM-PAC PT "6 Clicks" Mobility   Outcome Measure  Help needed turning from your back to your side while in a flat bed without using bedrails?: A Lot Help needed moving from lying on your back to sitting on the side of a flat bed without using bedrails?: A Lot Help needed moving to and from a bed to a chair (including a wheelchair)?: A Little Help needed standing up from a chair using your arms (e.g., wheelchair or bedside chair)?: A Little Help needed to walk in hospital room?: A Little Help needed climbing 3-5 steps with a railing? : Total 6 Click Score: 14    End of Session Equipment Utilized During Treatment: Gait belt Activity Tolerance: Patient tolerated treatment well Patient left: in chair;with call bell/phone within reach;with chair alarm set Nurse Communication: Mobility status PT Visit Diagnosis: Repeated falls (R29.6);Muscle weakness (generalized) (M62.81);Difficulty in walking, not elsewhere classified (R26.2);Pain Pain - part of body:  (back)     Time: 1210-1232 PT Time Calculation (min) (ACUTE ONLY): 22 min  Charges:    $Gait Training: 8-22 mins $Therapeutic Exercise: 8-22 mins PT General Charges $$ ACUTE PT VISIT: 1 Visit                    Zadie Cleverly, PTA  Jannet Askew 03/04/2023, 1:04 PM

## 2023-03-05 NOTE — Progress Notes (Signed)
Neurosurgery Progress Note  History: [name] is here for L3-4 laminectomy, L4-5 laminectomy and discectomy of left side, L5-S1 laminectomy.  1/28 (POD5): Pt has some pain, but it is being managed by [name] medication. Overall doing well and was able to walk the hall with PT. 1/27 (POD4): pt doing well this morning with some continued numbness in [name] left foot.  Interval 1/26: Patient is postop day 3 from [name] decompression.  Today she states the back pain is more tolerable and the medication is helping.  [name] Hemovac was intermittently removed when she was standing yesterday.  Therapy has worked with [name].  She is tolerating a diet.   Hospital Course: 1/24: She is doing well POD1. She states [name] pain is under control.  1/25: Patient complaining of back pain this morning. HV with continued output. She had some low blood pressure last night and pain meds were held. She is doing better with IV and po fluids this morning Physical Exam: Vitals:   03/04/23 2354 03/05/23 0911  BP: (!) 90/45 (!) 134/53  Pulse: 73 81  Resp: 15 17  Temp: 97.9 F (36.6 C) 97.6 F (36.4 C)  SpO2: 93% 98%    Awake, alert  Strength:5/5 throughout lower extremities with 4+/5 in left PF Incisions c/d/I with staples in place. Honeycomb over incision site.   Data:  Other tests/results:   Output by Drain (mL) 03/03/23 0701 - 03/03/23 1900 03/03/23 1901 - 03/04/23 0700 03/04/23 0701 - 03/04/23 1900 03/04/23 1901 - 03/05/23 0700 03/05/23 0701 - 03/05/23 1027  Closed System Drain 1 Posterior Back Accordion (Hemovac)       Negative Pressure Wound Therapy Back Mid          Assessment/Plan:  [name] POD 5 s/p L3-4 laminectomy, L4-5 laminectomy and discectomy of left side, L5-S1 laminectomy.  - mobilize - pain control with oral medications - DVT prophylaxis - PTOT; plans for d/c to [facility] PA-C Department of Neurosurgery

## 2023-03-05 NOTE — Progress Notes (Signed)
Physical Therapy Treatment Patient Details Name: [name] MRN: [id number] DOB: 1950/04/16 Today's Date: 03/05/2023   History of Present Illness Pt is a 73 y.o. female s/p L3-4 laminectomy, L4-5 laminectomy/discectomy L side, and L5-S1 laminectomy on 02/28/23. PMH significant for COPD, arthritis, GERD, HTN, pneumonia, carpal tunnel release, and PAD    PT Comments  Log rolling instructions in and out of bed.  Stood and walks x 1 lap on unit and stops in bathroom to void.  Slow steady gait with heavy use of walker for support.  Opts to return to bed for an hour before getting up for lunch.  Progressing towards goals.   If plan is discharge home, recommend the following: Help with stairs or ramp for entrance;Assist for transportation;A little help with walking and/or transfers;A little help with bathing/dressing/bathroom;Assistance with cooking/housework   Can travel by private vehicle        Equipment Recommendations  Other (comment)    Recommendations for Other Services       Precautions / Restrictions Precautions Precautions: Fall;Back Precaution Booklet Issued: No Restrictions Weight Bearing Restrictions Per Provider Order: No     Mobility  Bed Mobility Overal bed mobility: Needs Assistance Bed Mobility: Supine to Sit, Sit to Supine     Supine to sit: Contact guard, HOB elevated, Used rails Sit to supine: Min assist, HOB elevated, Used rails     Patient Response: Cooperative  Transfers Overall transfer level: Needs assistance Equipment used: Rolling walker (2 wheels) Transfers: Sit to/from Stand Sit to Stand: Supervision                Ambulation/Gait Ambulation/Gait assistance: Contact guard assist Gait Distance (Feet): 160 Feet Assistive device: Rolling walker (2 wheels) Gait Pattern/deviations: Step-through pattern, Decreased stride length Gait velocity: decr     General Gait Details: Slow cautious gait, increased LBP in standing   Stairs              Wheelchair Mobility     Tilt Bed Tilt Bed Patient Response: Cooperative  Modified Rankin (Stroke Patients Only)       Balance Overall balance assessment: History of Falls, Needs assistance Sitting-balance support: Feet supported, No upper extremity supported Sitting balance-Leahy Scale: Good     Standing balance support: No upper extremity supported, During functional activity Standing balance-Leahy Scale: Fair                              Cognition Arousal: Alert Behavior During Therapy: WFL for tasks assessed/performed Overall Cognitive Status: Within Functional Limits for tasks assessed                                          Exercises Other Exercises Other Exercises: to bathroom to void    General Comments        Pertinent Vitals/Pain Pain Assessment Pain Assessment: Faces Faces Pain Scale: Hurts a little bit Pain Location: LBP Pain Descriptors / Indicators: Discomfort, Dull, Grimacing, Aching Pain Intervention(s): Limited activity within patient's tolerance, Monitored during session, Repositioned    Home Living                          Prior Function            PT Goals (current goals can now be found in the care plan  section) Progress towards PT goals: Progressing toward goals    Frequency    7X/week      PT Plan      Co-evaluation              AM-PAC PT "6 Clicks" Mobility   Outcome Measure  Help needed turning from your back to your side while in a flat bed without using bedrails?: A Little Help needed moving from lying on your back to sitting on the side of a flat bed without using bedrails?: A Little Help needed moving to and from a bed to a chair (including a wheelchair)?: A Little Help needed standing up from a chair using your arms (e.g., wheelchair or bedside chair)?: A Little Help needed to walk in hospital room?: A Little Help needed climbing 3-5 steps with a railing? :  A Lot 6 Click Score: 17    End of Session Equipment Utilized During Treatment: Gait belt Activity Tolerance: Patient tolerated treatment well Patient left: with call bell/phone within reach;in bed;with bed alarm set Nurse Communication: Mobility status PT Visit Diagnosis: Repeated falls (R29.6);Muscle weakness (generalized) (M62.81);Difficulty in walking, not elsewhere classified (R26.2);Pain     Time: 1610-9604 PT Time Calculation (min) (ACUTE ONLY): 12 min  Charges:    $Gait Training: 8-22 mins PT General Charges $$ ACUTE PT VISIT: 1 Visit                   Danielle Dess, PTA 03/05/23, 10:23 AM

## 2023-03-05 NOTE — Plan of Care (Signed)
Patient alert and oriented. No distress noted. Medicated as per orders and as needed for pain. Repositioned per patient request. Will continue to monitor.   Problem: Education: Goal: Ability to verbalize activity precautions or restrictions will improve Outcome: Progressing Goal: Knowledge of the prescribed therapeutic regimen will improve Outcome: Progressing Goal: Understanding of discharge needs will improve Outcome: Progressing   Problem: Activity: Goal: Ability to avoid complications of mobility impairment will improve Outcome: Progressing Goal: Ability to tolerate increased activity will improve Outcome: Progressing Goal: Will remain free from falls Outcome: Progressing   Problem: Bowel/Gastric: Goal: Gastrointestinal status for postoperative course will improve Outcome: Progressing   Problem: Clinical Measurements: Goal: Ability to maintain clinical measurements within normal limits will improve Outcome: Progressing Goal: Postoperative complications will be avoided or minimized Outcome: Progressing Goal: Diagnostic test results will improve Outcome: Progressing   Problem: Pain Management: Goal: Pain level will decrease Outcome: Progressing   Problem: Skin Integrity: Goal: Will show signs of wound healing Outcome: Progressing   Problem: Health Behavior/Discharge Planning: Goal: Identification of resources available to assist in meeting health care needs will improve Outcome: Progressing   Problem: Bladder/Genitourinary: Goal: Urinary functional status for postoperative course will improve Outcome: Progressing   Problem: Education: Goal: Knowledge of General Education information will improve Description: Including pain rating scale, medication(s)/side effects and non-pharmacologic comfort measures Outcome: Progressing   Problem: Health Behavior/Discharge Planning: Goal: Ability to manage health-related needs will improve Outcome: Progressing   Problem:  Clinical Measurements: Goal: Ability to maintain clinical measurements within normal limits will improve Outcome: Progressing Goal: Will remain free from infection Outcome: Progressing Goal: Diagnostic test results will improve Outcome: Progressing Goal: Respiratory complications will improve Outcome: Progressing Goal: Cardiovascular complication will be avoided Outcome: Progressing   Problem: Activity: Goal: Risk for activity intolerance will decrease Outcome: Progressing   Problem: Nutrition: Goal: Adequate nutrition will be maintained Outcome: Progressing   Problem: Coping: Goal: Level of anxiety will decrease Outcome: Progressing   Problem: Elimination: Goal: Will not experience complications related to bowel motility Outcome: Progressing Goal: Will not experience complications related to urinary retention Outcome: Progressing   Problem: Pain Managment: Goal: General experience of comfort will improve and/or be controlled Outcome: Progressing   Problem: Safety: Goal: Ability to remain free from injury will improve Outcome: Progressing   Problem: Skin Integrity: Goal: Risk for impaired skin integrity will decrease Outcome: Progressing

## 2023-03-05 NOTE — Plan of Care (Signed)

## 2023-03-05 NOTE — TOC Progression Note (Signed)
Transition of Care [facility]) - Progression Note    Patient Details  Name: [name] MRN: [id number] Date of Birth: 06/12/1950  [facility] CM/SW Contact  Garret Reddish, RN Phone Number: 03/05/2023, 3:45 PM  Clinical Narrative:    Chart reviewed.  Bed offer received from [facility].  I have asked [employer] to start SNF authorization for [facility].  TOC will continue to follow for discharge planning.     Expected Discharge Plan: Skilled Nursing Facility Barriers to Discharge: Continued Medical Work up  Expected Discharge Plan and Services In-house Referral: Clinical Social Work   Post Acute Care Choice: Skilled Nursing Facility Living arrangements for the past 2 months: Single Family Home                                       Social Determinants of Health (SDOH) Interventions SDOH Screenings   Food Insecurity: No Food Insecurity (03/01/2023)  Housing: Low Risk  (03/01/2023)  Transportation Needs: No Transportation Needs (03/01/2023)  Utilities: Not At Risk (03/01/2023)  Alcohol Screen: Low Risk  (08/28/2022)  Depression (PHQ2-9): Low Risk  (02/08/2023)  Financial Resource Strain: Low Risk  (04/27/2022)  Physical Activity: Inactive (04/27/2022)  Social Connections: Moderately Isolated (03/01/2023)  Stress: No Stress Concern Present (04/27/2022)  Tobacco Use: Medium Risk (02/28/2023)    Readmission Risk Interventions    01/05/2023   12:33 PM  Readmission Risk Prevention Plan  Transportation Screening Complete  PCP or Specialist Appt within 3-5 Days Complete  HRI or Home Care Consult Complete  Social Work Consult for Recovery Care Planning/Counseling Complete  Palliative Care Screening Not Applicable  Medication Review [employer]) Complete

## 2023-03-06 ENCOUNTER — Other Ambulatory Visit: Payer: Self-pay | Admitting: Internal Medicine

## 2023-03-06 DIAGNOSIS — F410 Panic disorder [episodic paroxysmal anxiety] without agoraphobia: Secondary | ICD-10-CM

## 2023-03-06 MED ORDER — ORAL CARE MOUTH RINSE: 15.0000 mL | OROMUCOSAL | Status: DC | PRN

## 2023-03-06 NOTE — Progress Notes (Signed)
Neurosurgery Progress Note  History: [name] is here for L3-4 laminectomy, L4-5 laminectomy and discectomy of left side, L5-S1 laminectomy.  1/29 (POD6): Continues to do well this morning without concerns.  1/28 (POD5): Pt has some pain, but it is being managed by her medication. Overall doing well and was able to walk the hall with PT. 1/27 (POD4): pt doing well this morning with some continued numbness in her left foot.  Interval 1/26: Patient is postop day 3 from her decompression.  Today she states the back pain is more tolerable and the medication is helping.  Her Hemovac was intermittently removed when she was standing yesterday.  Therapy has worked with her.  She is tolerating a diet.   Hospital Course: 1/24: She is doing well POD1. She states her pain is under control.  1/25: Patient complaining of back pain this morning. HV with continued output. She had some low blood pressure last night and pain meds were held. She is doing better with IV and po fluids this morning Physical Exam: Vitals:   03/06/23 0224 03/06/23 0814  BP: (!) 101/54 (!) 127/96  Pulse: 64 71  Resp:  16  Temp:  97.8 F (36.6 C)  SpO2: 93% 98%    Awake, alert  Strength:5/5 throughout lower extremities with 4+/5 in left PF Incisions c/d/I with staples in place. Honeycomb over incision site.   Data:  Other tests/results:   Output by Drain (mL) 03/04/23 0701 - 03/04/23 1900 03/04/23 1901 - 03/05/23 0700 03/05/23 0701 - 03/05/23 1900 03/05/23 1901 - 03/06/23 0700 03/06/23 0701 - 03/06/23 6010  Patient has no LDAs of requested type attached.     Assessment/Plan:  [name] POD 5 s/p L3-4 laminectomy, L4-5 laminectomy and discectomy of left side, L5-S1 laminectomy.  - mobilize - pain control with oral medications - DVT prophylaxis - PTOT; plans for d/c to [facility]. Had bed offer as of 1/28. Awaiting insurance authorization.   Manning Charity PA-C Department of Neurosurgery

## 2023-03-06 NOTE — Progress Notes (Signed)
Physical Therapy Treatment Patient Details Name: TATYANNA CRONK MRN: [id number] DOB: 1950-05-18 Today's Date: 03/06/2023   History of Present Illness Pt is a 73 y.o. female s/p L3-4 laminectomy, L4-5 laminectomy/discectomy L side, and L5-S1 laminectomy on 02/28/23. PMH significant for COPD, arthritis, GERD, HTN, pneumonia, carpal tunnel release, and PAD    PT Comments  Pt was sitting at EOB upon arrival. She is A and O x 4 and agreeable to session. Remains cooperative throughout. Pt was on rm air throughout session with sao2 dropping to 97% at its lowest. Pt was easily and safely able to stand and ambulate 2 laps in hallway. 1 lap with RW and 1 lap with rollator. Pt was only able to recall 1/3 precautions but after education was able to recall at conclusion of session. Acute PT will continue to follow and progress per current POC.     If plan is discharge home, recommend the following: Help with stairs or ramp for entrance;Assist for transportation;A little help with walking and/or transfers;A little help with bathing/dressing/bathroom;Assistance with cooking/housework     Equipment Recommendations   (Pt does not want RW." I like my rollator more.")       Precautions / Restrictions Precautions Precautions: Fall;Back Precaution Booklet Issued: No Precaution Comments: Pt was able to recall 1/3 precautions. educated pt on avoiding twisting and lifting anything heavier than 10 lb Restrictions Weight Bearing Restrictions Per Provider Order: No     Mobility  Bed Mobility  General bed mobility comments: pt was seated EOB pre/post session    Transfers Overall transfer level: Needs assistance Equipment used: Rolling walker (2 wheels), Rollator (4 wheels) Transfers: Sit to/from Stand Sit to Stand: Supervision  General transfer comment: no physical assistance to stand or sit    Ambulation/Gait Ambulation/Gait assistance: Supervision Gait Distance (Feet): 400 Feet Assistive device: Rolling  walker (2 wheels), Rollator (4 wheels) Gait Pattern/deviations: Step-through pattern Gait velocity: decr  General Gait Details: Pt was able to ambulate 2 laps around rn station. 1 with RW and 1 with rollator. vcs for posture correction only. no physical assistance. pt on rm air throughout session with sao2 > 97%   Balance Overall balance assessment: Modified Independent Sitting-balance support: Feet supported Sitting balance-Leahy Scale: Good     Standing balance support: Bilateral upper extremity supported, During functional activity, Reliant on assistive device for balance Standing balance-Leahy Scale: Good Standing balance comment: no LOB with BUE support, has used AD for several years now       Cognition Arousal: Alert Behavior During Therapy: WFL for tasks assessed/performed Overall Cognitive Status: Within Functional Limits for tasks assessed    General Comments: A&Ox4           General Comments General comments (skin integrity, edema, etc.): discussed DC disposition. pt still wants STR.      Pertinent Vitals/Pain Pain Assessment Pain Assessment: 0-10 Pain Score: 4  Pain Location: LLE Pain Descriptors / Indicators: Discomfort, Dull, Grimacing, Aching Pain Intervention(s): Limited activity within patient's tolerance, Monitored during session, Premedicated before session, Repositioned     PT Goals (current goals can now be found in the care plan section) Acute Rehab PT Goals Patient Stated Goal: rehab then home Progress towards PT goals: Progressing toward goals    Frequency    7X/week       AM-PAC PT "6 Clicks" Mobility   Outcome Measure  Help needed turning from your back to your side while in a flat bed without using bedrails?: A Little Help needed moving  from lying on your back to sitting on the side of a flat bed without using bedrails?: A Little Help needed moving to and from a bed to a chair (including a wheelchair)?: A Little Help needed standing  up from a chair using your arms (e.g., wheelchair or bedside chair)?: A Little Help needed to walk in hospital room?: A Little Help needed climbing 3-5 steps with a railing? : A Little 6 Click Score: 18    End of Session   Activity Tolerance: Patient tolerated treatment well Patient left: with call bell/phone within reach;in bed;with bed alarm set Nurse Communication: Mobility status PT Visit Diagnosis: Repeated falls (R29.6);Muscle weakness (generalized) (M62.81);Difficulty in walking, not elsewhere classified (R26.2);Pain     Time: 5784-6962 PT Time Calculation (min) (ACUTE ONLY): 10 min  Charges:    $Gait Training: 8-22 mins PT General Charges $$ ACUTE PT VISIT: 1 Visit                    Jetta Lout PTA 03/06/23, 10:06 AM

## 2023-03-06 NOTE — Plan of Care (Signed)
  Problem: Pain Management: Goal: Pain level will decrease Outcome: Progressing   Problem: Education: Goal: Knowledge of General Education information will improve Description: Including pain rating scale, medication(s)/side effects and non-pharmacologic comfort measures Outcome: Progressing   Problem: Health Behavior/Discharge Planning: Goal: Ability to manage health-related needs will improve Outcome: Progressing   Problem: Clinical Measurements: Goal: Ability to maintain clinical measurements within normal limits will improve Outcome: Progressing

## 2023-03-06 NOTE — Plan of Care (Signed)
  Problem: Education: Goal: Knowledge of the prescribed therapeutic regimen will improve Outcome: Progressing   Problem: Activity: Goal: Ability to tolerate increased activity will improve Outcome: Progressing   Problem: Pain Management: Goal: Pain level will decrease Outcome: Progressing   Problem: Skin Integrity: Goal: Will show signs of wound healing Outcome: Progressing

## 2023-03-06 NOTE — TOC Progression Note (Signed)
Transition of Care [facility]) - Progression Note    Patient Details  Name: [name] MRN: [id number] Date of Birth: 06/02/1950  Transition of Care [facility]) CM/SW Contact  Garret Reddish, RN Phone Number: 03/06/2023, 10:22 AM  Clinical Narrative:    Chart reviewed.  SNF authorization pending.  TOC will continue to follow for discharge planning.      Expected Discharge Plan: Skilled Nursing Facility Barriers to Discharge: Continued Medical Work up  Expected Discharge Plan and Services In-house Referral: Clinical Social Work   Post Acute Care Choice: Skilled Nursing Facility Living arrangements for the past 2 months: Single Family Home                                       Social Determinants of Health (SDOH) Interventions SDOH Screenings   Food Insecurity: No Food Insecurity (03/01/2023)  Housing: Low Risk  (03/01/2023)  Transportation Needs: No Transportation Needs (03/01/2023)  Utilities: Not At Risk (03/01/2023)  Alcohol Screen: Low Risk  (08/28/2022)  Depression (PHQ2-9): Low Risk  (02/08/2023)  Financial Resource Strain: Low Risk  (04/27/2022)  Physical Activity: Inactive (04/27/2022)  Social Connections: Moderately Isolated (03/01/2023)  Stress: No Stress Concern Present (04/27/2022)  Tobacco Use: Medium Risk (02/28/2023)    Readmission Risk Interventions    01/05/2023   12:33 PM  Readmission Risk Prevention Plan  Transportation Screening Complete  PCP or Specialist Appt within 3-5 Days Complete  HRI or Home Care Consult Complete  Social Work Consult for Recovery Care Planning/Counseling Complete  Palliative Care Screening Not Applicable  Medication Review Oceanographer) Complete

## 2023-03-07 LAB — CREATININE, SERUM
Creatinine, Ser: 1.12 mg/dL — ABNORMAL HIGH (ref 0.44–1.00)
GFR, Estimated: 52 mL/min — ABNORMAL LOW (ref >60)

## 2023-03-07 MED ORDER — ENOXAPARIN SODIUM 40 MG/0.4ML IJ SOSY
40.0000 mg | PREFILLED_SYRINGE | INTRAMUSCULAR | Status: DC
  Administered 2023-03-08: 40 mg via SUBCUTANEOUS
  Filled 2023-03-07: qty 0.4

## 2023-03-07 MED ORDER — HYDROMORPHONE HCL 2 MG PO TABS
1.0000 mg | ORAL_TABLET | Freq: Four times a day (QID) | ORAL | Status: DC | PRN
  Administered 2023-03-07: 1 mg via ORAL
  Filled 2023-03-07: qty 1

## 2023-03-07 MED ORDER — DEXAMETHASONE 4 MG PO TABS
2.0000 mg | ORAL_TABLET | Freq: Four times a day (QID) | ORAL | Status: AC
  Administered 2023-03-07 – 2023-03-08 (×4): 2 mg via ORAL
  Filled 2023-03-07 (×4): qty 0.5

## 2023-03-07 NOTE — Plan of Care (Signed)
  Problem: Education: Goal: Understanding of discharge needs will improve Outcome: Progressing   Problem: Activity: Goal: Ability to avoid complications of mobility impairment will improve Outcome: Progressing Goal: Will remain free from falls Outcome: Progressing   Problem: Clinical Measurements: Goal: Ability to maintain clinical measurements within normal limits will improve Outcome: Progressing

## 2023-03-07 NOTE — Plan of Care (Signed)
  Problem: Pain Managment: Goal: General experience of comfort will improve and/or be controlled Outcome: Progressing   Problem: Safety: Goal: Ability to remain free from injury will improve Outcome: Progressing   Problem: Skin Integrity: Goal: Risk for impaired skin integrity will decrease Outcome: Progressing

## 2023-03-07 NOTE — Telephone Encounter (Signed)
Requested medication (s) are due for refill today:   Provider to review  Requested medication (s) are on the active medication list:   Yes  Future visit scheduled:   Seen 3 wks ago   Last ordered: 03/04/2023 #30, 0 refills from hospital provider  Non delegated refill    Requested Prescriptions  Pending Prescriptions Disp Refills   clonazePAM (KLONOPIN) 1 MG tablet [Pharmacy Med Name: CLONAZEPAM 1MG  TABLETS] 75 tablet     Sig: TAKE 1 TABLET BY MOUTH EVERY MORNING, 1/2 TABLET EVERY AFTERNOON AND 1 TABLET EVERY NIGHT AT BEDTIME     Not Delegated - Psychiatry: Anxiolytics/Hypnotics 2 Failed - 03/07/2023  3:37 PM      Failed - This refill cannot be delegated      Failed - Urine Drug Screen completed in last 360 days      Failed - Valid encounter within last 6 months    Recent Outpatient Visits           3 weeks ago Preoperative clearance   [facility] [place], Salvadore Oxford, NP   6 months ago Encounter for general adult medical examination with abnormal findings   [place] [facility], Salvadore Oxford, NP   10 months ago COPD exacerbation [facility])   [place] [facility] [place], Salvadore Oxford, NP   1 year ago Mixed hyperlipidemia   [place] [facility] [place], Salvadore Oxford, NP   1 year ago Acquired hypothyroidism   [place] [facility] [place], Salvadore Oxford, [place]              Passed - Patient is not pregnant

## 2023-03-07 NOTE — Progress Notes (Signed)
Neurosurgery Progress Note  History: [name] is here for L3-4 laminectomy, L4-5 laminectomy and discectomy of left side, L5-S1 laminectomy.  1/30 (POD7): pt complaining of 2 episodes of shooting left anterior thigh and leg pain, once overnight and once again this morning similar to her pre-op pain  1/29 (POD6): Continues to do well this morning without concerns.  1/28 (POD5): Pt has some pain, but it is being managed by her medication. Overall doing well and was able to walk the hall with PT. 1/27 (POD4): pt doing well this morning with some continued numbness in her left foot.  Interval 1/26: Patient is postop day 3 from her decompression.  Today she states the back pain is more tolerable and the medication is helping.  Her Hemovac was intermittently removed when she was standing yesterday.  Therapy has worked with her.  She is tolerating a diet.   Hospital Course: 1/24: She is doing well POD1. She states her pain is under control.  1/25: Patient complaining of back pain this morning. HV with continued output. She had some low blood pressure last night and pain meds were held. She is doing better with IV and po fluids this morning Physical Exam: Vitals:   03/06/23 2334 03/07/23 0754  BP: (!) 93/45 112/63  Pulse: 68 72  Resp: 18 16  Temp: 97.7 F (36.5 C) 98.5 F (36.9 C)  SpO2: 94% 96%    Awake, alert  Strength:5/5 throughout lower extremities with 4+/5 in left PF Incisions c/d/I with staples in place. Honeycomb over incision site.   Data:  Other tests/results:   Output by Drain (mL) 03/05/23 0701 - 03/05/23 1900 03/05/23 1901 - 03/06/23 0700 03/06/23 0701 - 03/06/23 1900 03/06/23 1901 - 03/07/23 0700 03/07/23 0701 - 03/07/23 0831  Patient has no LDAs of requested type attached.     Assessment/Plan:  [name] POD 5 s/p L3-4 laminectomy, L4-5 laminectomy and discectomy of left side, L5-S1 laminectomy.  - mobilize - pain control. Have Dc'ed IV Dilaudid and added a  PRN dose of PO dilaudid as needed for severe pain  - DVT prophylaxis - PTOT; plans for d/c to [facility]. Had bed offer as of 1/28. Awaiting insurance authorization.   Manning Charity PA-C Department of Neurosurgery

## 2023-03-07 NOTE — Progress Notes (Signed)
Occupational Therapy Treatment Patient Details Name: [name] MRN: [id number] DOB: 19-Jun-1950 Today's Date: 03/07/2023   History of present illness Pt is a 73 y.o. female s/p L3-4 laminectomy, L4-5 laminectomy/discectomy L side, and L5-S1 laminectomy on 02/28/23. PMH significant for COPD, arthritis, GERD, HTN, pneumonia, carpal tunnel release, and PAD   OT comments  Pt. education was provided about A/E use for LE ADLs. Pt. required mod A to perform LE dressing 2/2 8-9/10 pain.   Pt. was able to recall post surgical precautions with cues. Reviewed Pt.'s home and bathroom set-up, as well as work simplification strategies for daily ADL, and IADL functioning. Pt. continues to benefit from OT services for ADL training, A/E training, and pt. education about home modification, and DME. OT discharge recommendations remain appropriate.      If plan is discharge home, recommend the following:      Equipment Recommendations  None recommended by OT    Recommendations for Other Services      Precautions / Restrictions Precautions Precautions: Fall Precaution Booklet Issued: No Restrictions Weight Bearing Restrictions Per Provider Order: No       Mobility Bed Mobility               General bed mobility comments: Upon arrival, Pt. was sitting up at the EOB with the HOB elevated.    Transfers   Equipment used: Rolling walker (2 wheels), Rollator (4 wheels) Transfers: Sit to/from Stand Sit to Stand: Supervision                 Balance                                           ADL either performed or assessed with clinical judgement   ADL Overall ADL's : Needs assistance/impaired                     Lower Body Dressing: Moderate assistance;With adaptive equipment                      Extremity/Trunk Assessment Upper Extremity Assessment Upper Extremity Assessment: Generalized weakness            Vision Patient Visual Report:  No change from baseline Additional Comments: No change from baseline   Perception     Praxis      Cognition Arousal: Alert Behavior During Therapy: WFL for tasks assessed/performed Overall Cognitive Status: Within Functional Limits for tasks assessed                                 General Comments: A&Ox4        Exercises      Shoulder Instructions       General Comments      Pertinent Vitals/ Pain       Pain Assessment Pain Assessment: 0-10 Pain Score: 8-9/10  Pain Location: LLE Pain Descriptors / Indicators: Discomfort, Aching Pain Intervention(s): Limited activity within patient's tolerance, Monitored during session, Repositioned  Home Living                                          Prior Functioning/Environment  Frequency  Min 1X/week        Progress Toward Goals  OT Goals(current goals can now be found in the care plan section)  Progress towards OT goals: Progressing toward goals  Acute Rehab OT Goals OT Goal Formulation: With patient Time For Goal Achievement: 03/15/23 Potential to Achieve Goals: Good  Plan      Co-evaluation                 AM-PAC OT "6 Clicks" Daily Activity     Outcome Measure   Help from another person eating meals?: None Help from another person taking care of personal grooming?: None Help from another person toileting, which includes using toliet, bedpan, or urinal?: A Lot Help from another person bathing (including washing, rinsing, drying)?: A Lot Help from another person to put on and taking off regular upper body clothing?: A Little Help from another person to put on and taking off regular lower body clothing?: A Lot 6 Click Score: 17    End of Session Equipment Utilized During Treatment: Rolling walker (2 wheels);Gait belt;Oxygen  OT Visit Diagnosis: Unsteadiness on feet (R26.81);History of falling (Z91.81);Other abnormalities of gait and mobility  (R26.89);Repeated falls (R29.6);Pain Pain - Right/Left: Left   Activity Tolerance Patient tolerated treatment well;No increased pain   Patient Left in bed;with call bell/phone within reach;with bed alarm set   Nurse Communication Mobility status;Patient requests pain meds        Time: 1610-9604 OT Time Calculation (min): 20 min  Charges: OT General Charges $OT Visit: 1 Visit OT Treatments $Self Care/Home Management : 8-22 mins  Olegario Messier, MS, OTR/L   Olegario Messier 03/07/2023, 4:38 PM

## 2023-03-07 NOTE — TOC Progression Note (Signed)
Transition of Care [facility]) - Progression Note    Patient Details  Name: [name] MRN: [id number] Date of Birth: [birth year]  Transition of Care [facility]) CM/SW Contact  Garret Reddish, RN Phone Number: 03/07/2023, 3:23 PM  Clinical Narrative:    Chart reviewed.  Insurance company has denied SNF authorization and ambulance transportation authorization.  [employer] has offered a Peer to Peer  with Medical Director Dr. Sudie Grumbling.  His contact number is [phone]. Peer to Peer deadline is tomorrow at 10 am.  Providers made aware.    TOC will continue to follow for discharge planning.     Expected Discharge Plan: Skilled Nursing Facility Barriers to Discharge: Continued Medical Work up  Expected Discharge Plan and Services In-house Referral: Clinical Social Work   Post Acute Care Choice: Skilled Nursing Facility Living arrangements for the past 2 months: Single Family Home                                       Social Determinants of Health (SDOH) Interventions SDOH Screenings   Food Insecurity: No Food Insecurity (03/01/2023)  Housing: Low Risk  (03/01/2023)  Transportation Needs: No Transportation Needs (03/01/2023)  Utilities: Not At Risk (03/01/2023)  Alcohol Screen: Low Risk  (08/28/2022)  Depression (PHQ2-9): Low Risk  (02/08/2023)  Financial Resource Strain: Low Risk  (04/27/2022)  Physical Activity: Inactive (04/27/2022)  Social Connections: Moderately Isolated (03/01/2023)  Stress: No Stress Concern Present (04/27/2022)  Tobacco Use: Medium Risk (02/28/2023)    Readmission Risk Interventions    01/05/2023   12:33 PM  Readmission Risk Prevention Plan  Transportation Screening Complete  PCP or Specialist Appt within 3-5 Days Complete  HRI or Home Care Consult Complete  Social Work Consult for Recovery Care Planning/Counseling Complete  Palliative Care Screening Not Applicable  Medication Review Oceanographer) Complete

## 2023-03-07 NOTE — Progress Notes (Signed)
Physical Therapy Treatment Patient Details Name: [name] MRN: [id number] DOB: 28-Dec-1950 Today's Date: 03/07/2023   History of Present Illness Pt is a 73 y.o. female s/p L3-4 laminectomy, L4-5 laminectomy/discectomy L side, and L5-S1 laminectomy on 02/28/23. PMH significant for COPD, arthritis, GERD, HTN, pneumonia, carpal tunnel release, and PAD    PT Comments  Pt was seated EOB pre/post session. No bed alarm pre session but was placed post per protocols. Pt remains A and O x 4. Extremely pleasant and cooperative. She was able to recall 2/3 precautions. Pt endorses severe LLE pain previous night and some during this session. Pt was able to stand and ambulate with rollator without LOB. Safely demonstrated abilities to ascend/descend stairs with CGA." I don't have stairs and haven't done stairs in years." Overall pt is progressing well. Recommend continued skilled PT at DC to maximize her independence and safety with all ADLs.     If plan is discharge home, recommend the following: A little help with walking and/or transfers;A little help with bathing/dressing/bathroom;Assistance with cooking/housework;Direct supervision/assist for medications management;Direct supervision/assist for financial management;Assist for transportation;Help with stairs or ramp for entrance;Supervision due to cognitive status     Equipment Recommendations  None recommended by PT       Precautions / Restrictions Precautions Precautions: Fall;Back Precaution Booklet Issued: No Precaution Comments: Pt was able to recall 2/3 precautions. Restrictions Weight Bearing Restrictions Per Provider Order: No     Mobility  Bed Mobility  General bed mobility comments: pt was seated EOB pre/post session. no bed alarm pre but was placed post    Transfers Overall transfer level: Needs assistance Equipment used: Rolling walker (2 wheels), Rollator (4 wheels) Transfers: Sit to/from Stand Sit to Stand: Supervision  General  transfer comment: no physical assistance to stand or sit    Ambulation/Gait Ambulation/Gait assistance: Supervision Gait Distance (Feet): 200 Feet Assistive device: Rollator (4 wheels) Gait Pattern/deviations: Step-through pattern  General Gait Details: pt demonstrated safe abilities to ambulate ~ 200 ft with Rollator. no LOB. pt does endorse increased LLE pain after wt bearing activity. elected not to have pain medications administered for it   Stairs Stairs: Yes Stairs assistance: Contact guard assist Stair Management: Two rails, Step to pattern, Forwards Number of Stairs: 4 General stair comments: Pt was able to safely ascend/descend 4 stair with VCs for technique. pt slightly anxious with stairs but overall tolerated well. " I haven't done stairs in years."   Balance Overall balance assessment: Modified Independent       Cognition Arousal: Alert Behavior During Therapy: WFL for tasks assessed/performed Overall Cognitive Status: Within Functional Limits for tasks assessed    General Comments: A&Ox4               Pertinent Vitals/Pain Pain Assessment Pain Assessment: 0-10 Pain Score: 6  Pain Location: LLE Pain Descriptors / Indicators: Discomfort, Dull, Grimacing, Aching Pain Intervention(s): Limited activity within patient's tolerance, Monitored during session, Repositioned     PT Goals (current goals can now be found in the care plan section) Acute Rehab PT Goals Patient Stated Goal: rehab then home Progress towards PT goals: Progressing toward goals    Frequency    7X/week       AM-PAC PT "6 Clicks" Mobility   Outcome Measure  Help needed turning from your back to your side while in a flat bed without using bedrails?: A Little Help needed moving from lying on your back to sitting on the side of a flat bed without  using bedrails?: A Little Help needed moving to and from a bed to a chair (including a wheelchair)?: A Little Help needed standing up from  a chair using your arms (e.g., wheelchair or bedside chair)?: A Little Help needed to walk in hospital room?: A Little Help needed climbing 3-5 steps with a railing? : A Little 6 Click Score: 18    End of Session   Activity Tolerance: Patient tolerated treatment well Patient left: with call bell/phone within reach;in bed;with bed alarm set Nurse Communication: Mobility status PT Visit Diagnosis: Repeated falls (R29.6);Muscle weakness (generalized) (M62.81);Difficulty in walking, not elsewhere classified (R26.2);Pain Pain - Right/Left: Left Pain - part of body: Leg     Time: 4098-1191 PT Time Calculation (min) (ACUTE ONLY): 26 min  Charges:    $Gait Training: 8-22 mins $Therapeutic Activity: 8-22 mins PT General Charges $$ ACUTE PT VISIT: 1 Visit                    Jetta Lout PTA 03/07/23, 8:58 AM

## 2023-03-08 ENCOUNTER — Other Ambulatory Visit: Payer: Self-pay

## 2023-03-08 ENCOUNTER — Other Ambulatory Visit: Payer: Self-pay | Admitting: Internal Medicine

## 2023-03-08 DIAGNOSIS — F410 Panic disorder [episodic paroxysmal anxiety] without agoraphobia: Secondary | ICD-10-CM

## 2023-03-08 MED ORDER — METHYLPREDNISOLONE 4 MG PO TBPK
ORAL_TABLET | ORAL | 0 refills | Status: AC
  Filled 2023-03-08: qty 21, 6d supply, fill #0

## 2023-03-08 MED ORDER — SENNA 8.6 MG PO TABS
1.0000 | ORAL_TABLET | Freq: Two times a day (BID) | ORAL | 0 refills | Status: DC | PRN
  Filled 2023-03-08: qty 100, 50d supply, fill #0

## 2023-03-08 MED ORDER — CYCLOBENZAPRINE HCL 10 MG PO TABS
10.0000 mg | ORAL_TABLET | Freq: Three times a day (TID) | ORAL | 0 refills | Status: DC | PRN
  Filled 2023-03-08: qty 90, 30d supply, fill #0

## 2023-03-08 MED ORDER — HYDROMORPHONE HCL 2 MG PO TABS
1.0000 mg | ORAL_TABLET | Freq: Three times a day (TID) | ORAL | 0 refills | Status: DC | PRN
  Filled 2023-03-08: qty 30, 20d supply, fill #0

## 2023-03-08 MED ORDER — TRAMADOL HCL 50 MG PO TABS
50.0000 mg | ORAL_TABLET | Freq: Four times a day (QID) | ORAL | 0 refills | Status: DC
  Filled 2023-03-08: qty 30, 8d supply, fill #0

## 2023-03-08 NOTE — Progress Notes (Signed)
Physical Therapy Treatment Patient Details Name: [name] MRN: [id number] DOB: 01/16/1951 Today's Date: 03/08/2023   History of Present Illness Pt is a 73 y.o. female s/p L3-4 laminectomy, L4-5 laminectomy/discectomy L side, and L5-S1 laminectomy on 02/28/23. PMH significant for COPD, arthritis, GERD, HTN, pneumonia, carpal tunnel release, and PAD    PT Comments  Pt was seated EOB upon arrival. She is  A and O x 4. Denied by insurance for STR however pt Is progressing well and feels safe to DC home today. Chartered loss adjuster discussed importance of routine mobility at home and what to expect form HH PT. Pt eager to get up and ambulate. Tolerated gait ~ 400 ft without LOB or safety concerns. Acute PT will continue to follow up until DC form acute hospital.    If plan is discharge home, recommend the following: A little help with walking and/or transfers;A little help with bathing/dressing/bathroom;Assistance with cooking/housework;Direct supervision/assist for medications management;Direct supervision/assist for financial management;Assist for transportation;Help with stairs or ramp for entrance;Supervision due to cognitive status     Equipment Recommendations  None recommended by PT       Precautions / Restrictions Precautions Precautions: Fall Precaution Booklet Issued: No Restrictions Weight Bearing Restrictions Per Provider Order: No     Mobility  Bed Mobility  General bed mobility comments: pt was seated EOB pre/post session. no bed alarm in place pre/post session    Transfers Overall transfer level: Modified independent Equipment used: Rolling walker (2 wheels) Transfers: Sit to/from Stand Sit to Stand: Supervision     Ambulation/Gait Ambulation/Gait assistance: Supervision Gait Distance (Feet): 400 Feet Assistive device: Rollator (4 wheels) Gait Pattern/deviations: WFL(Within Functional Limits) Gait velocity: decr  General Gait Details: pt was cued for posture correction only. no  LOB or safety concerns with ambulation with use of AD   Stairs  General stair comments: pt does not have stairs and stated she feels confident in her abilities. Did perform stairs well prior PT session    Balance Overall balance assessment: Modified Independent     Cognition Arousal: Alert Behavior During Therapy: WFL for tasks assessed/performed Overall Cognitive Status: Within Functional Limits for tasks assessed    General Comments: A&Ox4           General Comments General comments (skin integrity, edema, etc.): Reviewed HH expectations and importance of pt ambulating frequently at home      Pertinent Vitals/Pain Pain Assessment Pain Assessment: 0-10 Pain Score: 3  Pain Location: LLE Pain Descriptors / Indicators: Discomfort, Aching Pain Intervention(s): Limited activity within patient's tolerance, Premedicated before session, Monitored during session, Repositioned     PT Goals (current goals can now be found in the care plan section) Acute Rehab PT Goals Patient Stated Goal: home today Progress towards PT goals: Progressing toward goals    Frequency    7X/week       AM-PAC PT "6 Clicks" Mobility   Outcome Measure  Help needed turning from your back to your side while in a flat bed without using bedrails?: A Little Help needed moving from lying on your back to sitting on the side of a flat bed without using bedrails?: A Little Help needed moving to and from a bed to a chair (including a wheelchair)?: A Little Help needed standing up from a chair using your arms (e.g., wheelchair or bedside chair)?: A Little Help needed to walk in hospital room?: A Little Help needed climbing 3-5 steps with a railing? : A Little 6 Click Score: 18  End of Session   Activity Tolerance: Patient tolerated treatment well Patient left: with call bell/phone within reach;in bed Nurse Communication: Mobility status PT Visit Diagnosis: Repeated falls (R29.6);Muscle weakness  (generalized) (M62.81);Difficulty in walking, not elsewhere classified (R26.2);Pain Pain - Right/Left: Left Pain - part of body: Leg     Time: 0905-0920 PT Time Calculation (min) (ACUTE ONLY): 15 min  Charges:    $Gait Training: 8-22 mins PT General Charges $$ ACUTE PT VISIT: 1 Visit                     Jetta Lout PTA 03/08/23, 10:10 AM

## 2023-03-08 NOTE — Plan of Care (Signed)
  Problem: Bowel/Gastric: Goal: Gastrointestinal status for postoperative course will improve Outcome: Progressing   Problem: Clinical Measurements: Goal: Ability to maintain clinical measurements within normal limits will improve Outcome: Progressing Goal: Postoperative complications will be avoided or minimized Outcome: Progressing

## 2023-03-08 NOTE — TOC Transition Note (Signed)
Transition of Care [facility]) - Discharge Note   Patient Details  Name: [name] MRN: [id number] Date of Birth: [birth year]  Transition of Care [facility]) CM/SW Contact:  Garret Reddish, RN Phone Number: 03/08/2023, 10:12 AM   Clinical Narrative:    Chart reviewed.  Provider informs me that Peer to Peer was completed and patient was denied for SNF.    I have informed [name] of the above information.  I have spoken to her about Home Health services at home.  [name] was agreeable.  She does not have any home health preference.  I have asked Aertiva with [facility] to accept home health referral.  Adelina Mings will provide home health PT and OT for home health services.  Patient reports that she has supportive sons that will check on her post discharge. Patient reports that she has an [facility] that is over her commode and a rolling walker.    I have informed staff nurse of the above information.     Final next level of care: Home w Home Health Services Barriers to Discharge: No Barriers Identified   Patient Goals and CMS Choice Patient states their goals for this hospitalization and ongoing recovery are:: to get better- pt is in pain CMS Medicare.gov Compare Post Acute Care list provided to:: Patient Choice offered to / list presented to : Patient      Discharge Placement                    Patient and family notified of of transfer: 03/08/23  Discharge Plan and Services Additional resources added to the After Visit Summary for   In-house Referral: Clinical Social Work   Post Acute Care Choice: Skilled Nursing Facility                      [facility] Agency: [facility] Date HH Agency Contacted: 03/08/23 Time HH Agency Contacted: 1010 Representative spoke with at [facility] Agency: Educational psychologist  Social Drivers of Health (SDOH) Interventions SDOH Screenings   Food Insecurity: No Food Insecurity (03/01/2023)  Housing: Low Risk  (03/01/2023)  Transportation Needs: No  Transportation Needs (03/01/2023)  Utilities: Not At Risk (03/01/2023)  Alcohol Screen: Low Risk  (08/28/2022)  Depression (PHQ2-9): Low Risk  (02/08/2023)  Financial Resource Strain: Low Risk  (04/27/2022)  Physical Activity: Inactive (04/27/2022)  Social Connections: Moderately Isolated (03/01/2023)  Stress: No Stress Concern Present (04/27/2022)  Tobacco Use: Medium Risk (02/28/2023)     Readmission Risk Interventions    01/05/2023   12:33 PM  Readmission Risk Prevention Plan  Transportation Screening Complete  PCP or Specialist Appt within 3-5 Days Complete  HRI or Home Care Consult Complete  Social Work Consult for Recovery Care Planning/Counseling Complete  Palliative Care Screening Not Applicable  Medication Review [employer]) Complete

## 2023-03-08 NOTE — Progress Notes (Signed)
Patient given discharge instructions. Patient was wheeled out via wheelchair to sons car.  Of note, all personal belongs with with the patient and medications were delivered from pharmacy.

## 2023-03-08 NOTE — Progress Notes (Cosign Needed Addendum)
Neurosurgery Progress Note  History: [name] is here for L3-4 laminectomy, L4-5 laminectomy and discectomy of left side, L5-S1 laminectomy.  1/31 (POD8): Pt doing better this morning. Pain is well controlled on medications  1/30 (POD7): pt complaining of 2 episodes of shooting left anterior thigh and leg pain, once overnight and once again this morning similar to her pre-op pain  1/29 (POD6): Continues to do well this morning without concerns.  1/28 (POD5): Pt has some pain, but it is being managed by her medication. Overall doing well and was able to walk the hall with PT. 1/27 (POD4): pt doing well this morning with some continued numbness in her left foot.  Interval 1/26: Patient is postop day 3 from her decompression.  Today she states the back pain is more tolerable and the medication is helping.  Her Hemovac was intermittently removed when she was standing yesterday.  Therapy has worked with her.  She is tolerating a diet.   Hospital Course: 1/24: She is doing well POD1. She states her pain is under control.  1/25: Patient complaining of back pain this morning. HV with continued output. She had some low blood pressure last night and pain meds were held. She is doing better with IV and po fluids this morning Physical Exam: Vitals:   03/07/23 2250 03/08/23 0748  BP: (!) 91/43 128/66  Pulse: 68 86  Resp: 18 16  Temp: 98 F (36.7 C) 98 F (36.7 C)  SpO2: 90% 93%    Awake, alert  Strength:5/5 throughout lower extremities with 4+/5 in left PF Incisions c/d/I with staples in place.   Data:  Other tests/results:   Output by Drain (mL) 03/06/23 0701 - 03/06/23 1900 03/06/23 1901 - 03/07/23 0700 03/07/23 0701 - 03/07/23 1900 03/07/23 1901 - 03/08/23 0700 03/08/23 0701 - 03/08/23 1014  Patient has no LDAs of requested type attached.     Assessment/Plan:  [name] POD 5 s/p L3-4 laminectomy, L4-5 laminectomy and discectomy of left side, L5-S1 laminectomy.  - mobilize -  pain control. Have Dc'ed IV Dilaudid and added a PRN dose of PO dilaudid as needed for severe pain  - DVT prophylaxis - PTOT; pt denied for SNF. Will proceed with d/c home today with [facility]  Manning Charity PA-C Department of Neurosurgery

## 2023-03-08 NOTE — Telephone Encounter (Signed)
Requested medications are due for refill today.  no  Requested medications are on the active medications list.  yes  Last refill. 03/04/2023 #30 0 rf  Future visit scheduled.   no  Notes to clinic.  Refilled 03/03/2022 - but rx was printed. Refill/ refusal not delegated.    Requested Prescriptions  Pending Prescriptions Disp Refills   clonazePAM (KLONOPIN) 1 MG tablet [Pharmacy Med Name: CLONAZEPAM 1MG  TABLETS] 75 tablet     Sig: TAKE 1 TABLET BY MOUTH EVERY MORNING, 1/2 TABLET EVERY AFTERNOON AND 1 TABLET EVERY NIGHT AT BEDTIME     Not Delegated - Psychiatry: Anxiolytics/Hypnotics 2 Failed - 03/08/2023  5:57 PM      Failed - This refill cannot be delegated      Failed - Urine Drug Screen completed in last 360 days      Failed - Valid encounter within last 6 months    Recent Outpatient Visits           4 weeks ago Preoperative clearance   [facility] [place], Salvadore Oxford, NP   6 months ago Encounter for general adult medical examination with abnormal findings   [place] [facility] [place], Salvadore Oxford, NP   10 months ago COPD exacerbation [facility])   [facility] [place], Salvadore Oxford, NP   1 year ago Mixed hyperlipidemia   [facility] [place], Salvadore Oxford, NP   1 year ago Acquired hypothyroidism   [place] [facility] [place], Salvadore Oxford, [place]              Passed - Patient is not pregnant

## 2023-03-11 ENCOUNTER — Telehealth: Payer: Self-pay | Admitting: Internal Medicine

## 2023-03-11 ENCOUNTER — Telehealth: Payer: Self-pay | Admitting: *Deleted

## 2023-03-11 DIAGNOSIS — E66811 Obesity, class 1: Secondary | ICD-10-CM | POA: Diagnosis not present

## 2023-03-11 DIAGNOSIS — J449 Chronic obstructive pulmonary disease, unspecified: Secondary | ICD-10-CM | POA: Diagnosis not present

## 2023-03-11 DIAGNOSIS — F410 Panic disorder [episodic paroxysmal anxiety] without agoraphobia: Secondary | ICD-10-CM | POA: Diagnosis not present

## 2023-03-11 DIAGNOSIS — E039 Hypothyroidism, unspecified: Secondary | ICD-10-CM | POA: Diagnosis not present

## 2023-03-11 DIAGNOSIS — M5116 Intervertebral disc disorders with radiculopathy, lumbar region: Secondary | ICD-10-CM | POA: Diagnosis not present

## 2023-03-11 DIAGNOSIS — Z6834 Body mass index (BMI) 34.0-34.9, adult: Secondary | ICD-10-CM | POA: Diagnosis not present

## 2023-03-11 DIAGNOSIS — Z87891 Personal history of nicotine dependence: Secondary | ICD-10-CM | POA: Diagnosis not present

## 2023-03-11 DIAGNOSIS — M48061 Spinal stenosis, lumbar region without neurogenic claudication: Secondary | ICD-10-CM | POA: Diagnosis not present

## 2023-03-11 DIAGNOSIS — E119 Type 2 diabetes mellitus without complications: Secondary | ICD-10-CM | POA: Diagnosis not present

## 2023-03-11 DIAGNOSIS — Z4789 Encounter for other orthopedic aftercare: Secondary | ICD-10-CM | POA: Diagnosis not present

## 2023-03-11 DIAGNOSIS — L409 Psoriasis, unspecified: Secondary | ICD-10-CM | POA: Diagnosis not present

## 2023-03-11 DIAGNOSIS — Z556 Problems related to health literacy: Secondary | ICD-10-CM | POA: Diagnosis not present

## 2023-03-11 DIAGNOSIS — Z7982 Long term (current) use of aspirin: Secondary | ICD-10-CM | POA: Diagnosis not present

## 2023-03-11 DIAGNOSIS — I10 Essential (primary) hypertension: Secondary | ICD-10-CM | POA: Diagnosis not present

## 2023-03-11 DIAGNOSIS — M4726 Other spondylosis with radiculopathy, lumbar region: Secondary | ICD-10-CM | POA: Diagnosis not present

## 2023-03-11 DIAGNOSIS — Z7952 Long term (current) use of systemic steroids: Secondary | ICD-10-CM | POA: Diagnosis not present

## 2023-03-11 DIAGNOSIS — M1990 Unspecified osteoarthritis, unspecified site: Secondary | ICD-10-CM | POA: Diagnosis not present

## 2023-03-11 DIAGNOSIS — Z7951 Long term (current) use of inhaled steroids: Secondary | ICD-10-CM | POA: Diagnosis not present

## 2023-03-11 DIAGNOSIS — M4316 Spondylolisthesis, lumbar region: Secondary | ICD-10-CM | POA: Diagnosis not present

## 2023-03-11 DIAGNOSIS — I6523 Occlusion and stenosis of bilateral carotid arteries: Secondary | ICD-10-CM | POA: Diagnosis not present

## 2023-03-11 DIAGNOSIS — F32A Depression, unspecified: Secondary | ICD-10-CM | POA: Diagnosis not present

## 2023-03-11 DIAGNOSIS — I700 Atherosclerosis of aorta: Secondary | ICD-10-CM | POA: Diagnosis not present

## 2023-03-11 DIAGNOSIS — K219 Gastro-esophageal reflux disease without esophagitis: Secondary | ICD-10-CM | POA: Diagnosis not present

## 2023-03-11 DIAGNOSIS — Z9181 History of falling: Secondary | ICD-10-CM | POA: Diagnosis not present

## 2023-03-11 NOTE — Transitions of Care (Post Inpatient/ED Visit) (Signed)
03/11/2023  Name: [name] MRN: [id number] DOB: [birth year]  Today's TOC FU Call Status: Today's TOC FU Call Status:: Successful TOC FU Call Completed TOC FU Call Complete Date: 03/11/23 Patient's Name and Date of Birth confirmed.  Transition Care Management Follow-up Telephone Call Date of Discharge: 03/08/23 Discharge Facility: [facility]) Type of Discharge: Inpatient Admission Primary Inpatient Discharge Diagnosis:: Lumbar radiculopathy How have you been since you were released from the hospital?: Better Any questions or concerns?: No  Items Reviewed: Did you receive and understand the discharge instructions provided?: Yes Medications obtained,verified, and reconciled?: Yes (Medications Reviewed) Any new allergies since your discharge?: No Dietary orders reviewed?: No Do you have support at home?: Yes People in Home: alone Name of Support/Comfort Primary Source: william  Medications Reviewed Today: Medications Reviewed Today     Reviewed by Luella Cook, RN (Case Manager) on 03/11/23 at 1223  Med List Status: <None>   Medication Order Taking? Sig Documenting Provider Last Dose Status Informant  acetaminophen (TYLENOL) 500 MG tablet [id number] Yes Take 500 mg by mouth every 6 (six) hours as needed for moderate pain.  [provider] Taking Active Self, Pharmacy Records, Multiple Informants  albuterol (VENTOLIN HFA) 108 (90 Base) MCG/ACT inhaler [id number] Yes INHALE 2 PUFFS BY MOUTH EVERY 6 HOURS AS NEEDED FOR WHEEZING OR SHORTNESS OF Barbaraann Barthel, DO Taking Active Self, Pharmacy Records, Multiple Informants  aspirin EC (ASPIRIN LOW DOSE) 81 MG tablet [id number] Yes TAKE 1 TABLET(81 MG) BY MOUTH DAILY. SWALLOW WHOLE Baity, Salvadore Oxford, NP Taking Active Self, Pharmacy Records, Multiple Informants  carvedilol (COREG) 3.125 MG tablet [id number] Yes TAKE 1 TABLET BY MOUTH TWICE DAILY WITH A MEAL Baity, Salvadore Oxford, NP Taking Active    citalopram (CELEXA) 40 MG tablet [id number] Yes TAKE 1 TABLET(40 MG) BY MOUTH DAILY Baity, Salvadore Oxford, NP Taking Active Self, Pharmacy Records, Multiple Informants  clobetasol cream (TEMOVATE) 0.05 % [id number] Yes Apply 1 Application topically as needed. Pharmacy, MD Taking Active   clonazePAM (KLONOPIN) 1 MG tablet [id number] Yes TAKE 1 TABLET BY MOUTH EVERY MORNING, 1/2 TABLET EVERY AFTERNOON AND 1 TABLET EVERY NIGHT AT BEDTIME Susanne Borders, PA Taking Active   cyclobenzaprine (FLEXERIL) 10 MG tablet [id number] Yes Take 1 tablet (10 mg total) by mouth 3 (three) times daily as needed for muscle spasms. Susanne Borders, PA Taking Active   diltiazem [facility] CD) 120 MG 24 hr capsule [id number] Yes TAKE 1 CAPSULE BY MOUTH EVERY DAY Baity, Salvadore Oxford, NP Taking Active   doxepin (SINEQUAN) 25 MG capsule [id number] Yes Take 50 mg by mouth at bedtime. [provider] Taking Active Self, Pharmacy Records, Multiple Informants  fenofibrate (TRICOR) 145 MG tablet [id number] Yes Take 1 tablet (145 mg total) by mouth daily. Lorre Munroe, NP Taking Active   fluticasone furoate-vilanterol (BREO ELLIPTA) 100-25 MCG/ACT AEPB [id number] Yes Inhale 1 puff into the lungs daily. Sunnie Nielsen, DO Taking Active Self, Pharmacy Records, Multiple Informants  HYDROmorphone (DILAUDID) 2 MG tablet [id number] Yes Take 0.5 tablets (1 mg total) by mouth 3 (three) times daily as needed for severe pain (pain score 7-10) (pain unrelieved by Tramadol). Susanne Borders, PA Taking Active   hydrOXYzine (ATARAX) 25 MG tablet [id number] Yes Take 25 mg by mouth at bedtime. [provider] Taking Active Self, Pharmacy Records, Multiple Informants  ipratropium-albuterol (DUONEB) 0.5-2.5 (3) MG/3ML SOLN [id number] Yes Take 3 mLs by nebulization every 6 (six) hours as needed (wheezing / shortness of  breath). Sunnie Nielsen, DO Taking Active Self, Pharmacy Records, Multiple Informants  levothyroxine (SYNTHROID) 100 MCG  tablet [id number] Yes Take 1 tablet (100 mcg total) by mouth daily. Lorre Munroe, NP Taking Active Self, Pharmacy Records, Multiple Informants  lisinopril (ZESTRIL) 10 MG tablet [id number] Yes TAKE 1 TABLET(10 MG) BY MOUTH DAILY Baity, Salvadore Oxford, NP Taking Active   methylPREDNISolone (MEDROL DOSEPAK) 4 MG TBPK tablet [id number] Yes Take 6 tablets (24 mg total) by mouth daily for 1 day, THEN 5 tablets (20 mg total) daily for 1 day, THEN 4 tablets (16 mg total) daily for 1 day, THEN 3 tablets (12 mg total) daily for 1 day, THEN 2 tablets (8 mg total) daily for 1 day, THEN 1 tablet (4 mg total) daily for 1 day. Susanne Borders, PA Taking Active   Misc. Devices (QUAD CANE/SMALL BASE) MISC [id number] Yes 1 Device by Does not apply route daily. Galen Manila, NP Taking Active Self, Pharmacy Records, Multiple Informants  pantoprazole (PROTONIX) 40 MG tablet [id number] Yes SMARTSIG:1 Tablet(s) By Mouth Every Evening [provider] Taking Active Self, Pharmacy Records, Multiple Informants  rosuvastatin (CRESTOR) 20 MG tablet [id number] Yes Take 1 tablet (20 mg total) by mouth daily. Lorre Munroe, NP Taking Active Self, Pharmacy Records, Multiple Informants  senna (SENOKOT) 8.6 MG TABS tablet [id number] Yes Take 1 tablet (8.6 mg total) by mouth 2 (two) times daily as needed for mild constipation. Susanne Borders, PA Taking Active   traMADol Janean Sark) 50 MG tablet [id number] Yes Take 1 tablet (50 mg total) by mouth every 6 (six) hours. Susanne Borders, PA Taking Active   triamcinolone ointment (KENALOG) 0.1 % [id number] Yes Apply 1 Application topically as needed. Pharmacy, MD Taking Active   Vitamin D, Ergocalciferol, (DRISDOL) 1.25 MG (50000 UNIT) CAPS capsule [id number] Yes Take 50,000 Units by mouth 2 (two) times a week. [provider] Taking Active Self, Pharmacy Records, Multiple Informants  Med List Note Drinda Butts, Marena Chancy, CPhT 04/12/22 2059): Pt stated was on shot from  dermatologist, but last shot was Monday            Home Care and Equipment/Supplies: Were Home Health Services Ordered?: Yes Name of Home Health Agency:: [facility] Has Agency set up a time to come to your home?: Yes First Home Health Visit Date: 03/11/23 Any new equipment or medical supplies ordered?: NA  Functional Questionnaire: Do you need assistance with bathing/showering or dressing?: No Do you need assistance with meal preparation?: No Do you need assistance with eating?: No Do you have difficulty maintaining continence: No Do you need assistance with getting out of bed/getting out of a chair/moving?: No Do you have difficulty managing or taking your medications?: No  Follow up appointments reviewed: PCP Follow-up appointment confirmed?: NA Specialist Hospital Follow-up appointment confirmed?: Yes Date of Specialist follow-up appointment?: 03/08/23 Follow-Up Specialty Provider:: Nehemiah Settle [place] PA [id number]/ Ernestine Mcmurray 04/10/2023 Do you need transportation to your follow-up appointment?: No Do you understand care options if your condition(s) worsen?: Yes-patient verbalized understanding  SDOH Interventions Today    Flowsheet Row Most Recent Value  SDOH Interventions   Food Insecurity Interventions Intervention Not Indicated  Housing Interventions Intervention Not Indicated  Transportation Interventions Intervention Not Indicated, Patient Resources (Friends/Family)  Utilities Interventions Intervention Not Indicated      Interventions Today    Flowsheet Row Most Recent Value  Chronic Disease   Chronic disease during today's visit Other  [Lumbar radiculopathy]  General Interventions   General Interventions  Discussed/Reviewed General Interventions Discussed, General Interventions Reviewed, Doctor Visits  Doctor Visits Discussed/Reviewed Doctor Visits Discussed, Doctor Visits Reviewed, Specialist  PCP/Specialist Visits Compliance with follow-up visit  Exercise  Interventions   Exercise Discussed/Reviewed Exercise Discussed  [[facility] is starting PT today]  Education Interventions   Education Provided Provided Education  Provided Verbal Education On Other  [RN educated the patient the importance of using the incentive spirometry]  Pharmacy Interventions   Pharmacy Dicussed/Reviewed Pharmacy Topics Discussed, Pharmacy Topics Reviewed        Gean Maidens BSN RN [facility] Health Care Management Coordinator [email or web address] Direct Dial: [phone]  Fax: [phone] Website: [email or web address]

## 2023-03-11 NOTE — Telephone Encounter (Signed)
Patient called and advised on 03/04/23 she should've received a printed Rx when she was discharged from the hospital. She looked through her paperwork and didn't see a printed Rx. Advised I will send this to [facility] for approval. She asked would someone call and let her know if it's sent. Advised typically we don't call the patient that the pharmacy will let her know. She says she will call them tomorrow.

## 2023-03-11 NOTE — Telephone Encounter (Signed)
Pt is calling in checking on the status of her refill request for clonazePAM (KLONOPIN) 1 MG tablet [[id number]] , pt would like a call back regarding this prescription.

## 2023-03-12 MED ORDER — CLONAZEPAM 1 MG PO TABS: ORAL_TABLET | ORAL | 0 refills | Status: DC

## 2023-03-12 NOTE — Telephone Encounter (Signed)
 Patient advised.

## 2023-03-12 NOTE — Telephone Encounter (Signed)
 Refilled

## 2023-03-12 NOTE — Addendum Note (Signed)
Addended by: Lorre Munroe on: 03/12/2023 08:39 AM   Modules accepted: Orders

## 2023-03-13 ENCOUNTER — Ambulatory Visit: Payer: PPO | Admitting: Physician Assistant

## 2023-03-13 ENCOUNTER — Encounter: Payer: Self-pay | Admitting: Physician Assistant

## 2023-03-13 VITALS — BP 108/58 | Ht 63.0 in | Wt 202.0 lb

## 2023-03-13 DIAGNOSIS — M48062 Spinal stenosis, lumbar region with neurogenic claudication: Secondary | ICD-10-CM

## 2023-03-13 DIAGNOSIS — Z9889 Other specified postprocedural states: Secondary | ICD-10-CM

## 2023-03-13 NOTE — Progress Notes (Signed)
   REFERRING PHYSICIAN:  Antonette Angeline ORN, [address] [place],  [place] [address]  DOS: 02/28/23, L3-4 LAMINECTOMY, L4-5 LAMINECTOMY AND DISCECTOMY OF LEFT SIDE, L5-S1 LAMINECTOMY   HISTORY OF PRESENT ILLNESS: [name] is approximately 2 weeks status post L3-4 lami, L4-5 lami and discectomy of the left side and L5-S1 lami. she is doing well. Her pain has improved very much since surgery.  PHYSICAL EXAMINATION:  General: Patient is well developed, well nourished, calm, collected, and in no apparent distress.   NEUROLOGICAL:  General: In no acute distress.   Awake, alert, oriented to person, place, and time.  Pupils equal round and reactive to light.  Facial tone is symmetric.      Strength:            Side Iliopsoas Quads Hamstring PF DF EHL  R 5 5 5 5 5 5   L 5 5 5 5 5 5    Incision c/d/I. Staples removed today. Medihoney and dressing applied. Patient does have severe psoriasis overlapping parts of her incision.   ROS (Neurologic):  Negative except as noted above  IMAGING: No new imaging.  ASSESSMENT/PLAN:  [name] is doing well approximately  2 weeks status post L3-4 lami, L4-5 lami and discectomy of the left side and L5-S1 lami. she is doing well. Her pain has improved very much since surgery.Staples removed today. Medihoney and dressing applied. Patient does have severe psoriasis overlapping parts of her incision.She was counseled on appropriate wound care.   I have advised the patient to lift up to 10 pounds until 6 weeks after surgery, then increase up to 25 pounds until 12 weeks after surgery.  After 12 weeks post-op, the patient advised to increase activity as tolerated. Plan to see back in 4 weeks for a 6 week post-op appointment.  Advised to contact the office if any questions or concerns arise.  Lyle Decamp PA-C Department of neurosurgery

## 2023-03-15 ENCOUNTER — Telehealth: Payer: Self-pay

## 2023-03-15 NOTE — Telephone Encounter (Signed)
 noted

## 2023-03-15 NOTE — Telephone Encounter (Signed)
 Copied from CRM [id number]. Topic: General - Other >> Mar 14, 2023  4:46 PM Ethelle Herb L wrote: Reason for CRM: Esther with [facility] calling to report that pt declined OT evaluation.

## 2023-03-15 NOTE — Telephone Encounter (Signed)
 OK for verbal orders as requested.

## 2023-03-15 NOTE — Telephone Encounter (Signed)
 Copied from CRM [id number]. Topic: General - Other >> Mar 15, 2023 11:39 AM Travis F wrote: Reason for CRM: Home Health Verbal Orders - Caller/Agency: [facility]  Callback Number: [phone]-Dzrlmzi Line  Service Requested: Physical Therapy Frequency: 1 week 9  Any new concerns about the patient? Yes Several medication interactions flagged totaling 6

## 2023-03-15 NOTE — Telephone Encounter (Signed)
 LM on personal voicemail advising okay for verbal orders.

## 2023-03-20 DIAGNOSIS — J9610 Chronic respiratory failure, unspecified whether with hypoxia or hypercapnia: Secondary | ICD-10-CM | POA: Diagnosis not present

## 2023-03-20 DIAGNOSIS — J449 Chronic obstructive pulmonary disease, unspecified: Secondary | ICD-10-CM | POA: Diagnosis not present

## 2023-03-23 DIAGNOSIS — Z03818 Encounter for observation for suspected exposure to other biological agents ruled out: Secondary | ICD-10-CM | POA: Diagnosis not present

## 2023-03-23 DIAGNOSIS — M79672 Pain in left foot: Secondary | ICD-10-CM | POA: Diagnosis not present

## 2023-03-25 ENCOUNTER — Observation Stay: Payer: PPO

## 2023-03-25 ENCOUNTER — Encounter: Payer: Self-pay | Admitting: Emergency Medicine

## 2023-03-25 ENCOUNTER — Emergency Department: Payer: PPO

## 2023-03-25 ENCOUNTER — Other Ambulatory Visit: Payer: Self-pay

## 2023-03-25 ENCOUNTER — Inpatient Hospital Stay
Admission: EM | Admit: 2023-03-25 | Discharge: 2023-03-29 | DRG: 689 | Disposition: A | Discharge status: Skilled Nursing Facility | Payer: PPO | Attending: Internal Medicine | Admitting: Internal Medicine

## 2023-03-25 DIAGNOSIS — R4182 Altered mental status, unspecified: Principal | ICD-10-CM

## 2023-03-25 DIAGNOSIS — J9811 Atelectasis: Secondary | ICD-10-CM | POA: Diagnosis not present

## 2023-03-25 DIAGNOSIS — Z1152 Encounter for screening for COVID-19: Secondary | ICD-10-CM

## 2023-03-25 DIAGNOSIS — I4891 Unspecified atrial fibrillation: Secondary | ICD-10-CM | POA: Diagnosis present

## 2023-03-25 DIAGNOSIS — R935 Abnormal findings on diagnostic imaging of other abdominal regions, including retroperitoneum: Secondary | ICD-10-CM | POA: Diagnosis not present

## 2023-03-25 DIAGNOSIS — E669 Obesity, unspecified: Secondary | ICD-10-CM | POA: Diagnosis present

## 2023-03-25 DIAGNOSIS — Z6833 Body mass index (BMI) 33.0-33.9, adult: Secondary | ICD-10-CM

## 2023-03-25 DIAGNOSIS — F419 Anxiety disorder, unspecified: Secondary | ICD-10-CM | POA: Diagnosis present

## 2023-03-25 DIAGNOSIS — F32A Depression, unspecified: Secondary | ICD-10-CM | POA: Diagnosis present

## 2023-03-25 DIAGNOSIS — Z803 Family history of malignant neoplasm of breast: Secondary | ICD-10-CM

## 2023-03-25 DIAGNOSIS — J101 Influenza due to other identified influenza virus with other respiratory manifestations: Principal | ICD-10-CM | POA: Diagnosis present

## 2023-03-25 DIAGNOSIS — Z79899 Other long term (current) drug therapy: Secondary | ICD-10-CM

## 2023-03-25 DIAGNOSIS — M549 Dorsalgia, unspecified: Secondary | ICD-10-CM | POA: Diagnosis present

## 2023-03-25 DIAGNOSIS — R0602 Shortness of breath: Secondary | ICD-10-CM | POA: Diagnosis not present

## 2023-03-25 DIAGNOSIS — K5730 Diverticulosis of large intestine without perforation or abscess without bleeding: Secondary | ICD-10-CM | POA: Diagnosis not present

## 2023-03-25 DIAGNOSIS — Z841 Family history of disorders of kidney and ureter: Secondary | ICD-10-CM

## 2023-03-25 DIAGNOSIS — Z79620 Long term (current) use of immunosuppressive biologic: Secondary | ICD-10-CM

## 2023-03-25 DIAGNOSIS — T40425A Adverse effect of tramadol, initial encounter: Secondary | ICD-10-CM | POA: Diagnosis present

## 2023-03-25 DIAGNOSIS — Z7982 Long term (current) use of aspirin: Secondary | ICD-10-CM

## 2023-03-25 DIAGNOSIS — Z882 Allergy status to sulfonamides status: Secondary | ICD-10-CM

## 2023-03-25 DIAGNOSIS — Z881 Allergy status to other antibiotic agents status: Secondary | ICD-10-CM

## 2023-03-25 DIAGNOSIS — G9341 Metabolic encephalopathy: Secondary | ICD-10-CM | POA: Diagnosis present

## 2023-03-25 DIAGNOSIS — I10 Essential (primary) hypertension: Secondary | ICD-10-CM | POA: Diagnosis not present

## 2023-03-25 DIAGNOSIS — N3001 Acute cystitis with hematuria: Secondary | ICD-10-CM | POA: Diagnosis not present

## 2023-03-25 DIAGNOSIS — Z885 Allergy status to narcotic agent status: Secondary | ICD-10-CM

## 2023-03-25 DIAGNOSIS — T481X5A Adverse effect of skeletal muscle relaxants [neuromuscular blocking agents], initial encounter: Secondary | ICD-10-CM | POA: Diagnosis present

## 2023-03-25 DIAGNOSIS — E039 Hypothyroidism, unspecified: Secondary | ICD-10-CM | POA: Diagnosis present

## 2023-03-25 DIAGNOSIS — M79671 Pain in right foot: Secondary | ICD-10-CM | POA: Diagnosis present

## 2023-03-25 DIAGNOSIS — J449 Chronic obstructive pulmonary disease, unspecified: Secondary | ICD-10-CM | POA: Diagnosis present

## 2023-03-25 DIAGNOSIS — Z823 Family history of stroke: Secondary | ICD-10-CM

## 2023-03-25 DIAGNOSIS — E1151 Type 2 diabetes mellitus with diabetic peripheral angiopathy without gangrene: Secondary | ICD-10-CM | POA: Diagnosis present

## 2023-03-25 DIAGNOSIS — K219 Gastro-esophageal reflux disease without esophagitis: Secondary | ICD-10-CM | POA: Diagnosis present

## 2023-03-25 DIAGNOSIS — Z87891 Personal history of nicotine dependence: Secondary | ICD-10-CM

## 2023-03-25 DIAGNOSIS — E785 Hyperlipidemia, unspecified: Secondary | ICD-10-CM | POA: Diagnosis present

## 2023-03-25 DIAGNOSIS — G928 Other toxic encephalopathy: Secondary | ICD-10-CM | POA: Diagnosis present

## 2023-03-25 DIAGNOSIS — Z87440 Personal history of urinary (tract) infections: Secondary | ICD-10-CM

## 2023-03-25 DIAGNOSIS — Z79890 Hormone replacement therapy (postmenopausal): Secondary | ICD-10-CM

## 2023-03-25 DIAGNOSIS — Z888 Allergy status to other drugs, medicaments and biological substances status: Secondary | ICD-10-CM

## 2023-03-25 DIAGNOSIS — R0989 Other specified symptoms and signs involving the circulatory and respiratory systems: Secondary | ICD-10-CM | POA: Diagnosis not present

## 2023-03-25 DIAGNOSIS — M7989 Other specified soft tissue disorders: Secondary | ICD-10-CM | POA: Diagnosis not present

## 2023-03-25 DIAGNOSIS — L409 Psoriasis, unspecified: Secondary | ICD-10-CM | POA: Diagnosis present

## 2023-03-25 DIAGNOSIS — R296 Repeated falls: Secondary | ICD-10-CM | POA: Diagnosis present

## 2023-03-25 DIAGNOSIS — I700 Atherosclerosis of aorta: Secondary | ICD-10-CM | POA: Diagnosis not present

## 2023-03-25 DIAGNOSIS — Z7951 Long term (current) use of inhaled steroids: Secondary | ICD-10-CM

## 2023-03-25 DIAGNOSIS — F410 Panic disorder [episodic paroxysmal anxiety] without agoraphobia: Secondary | ICD-10-CM

## 2023-03-25 DIAGNOSIS — Z825 Family history of asthma and other chronic lower respiratory diseases: Secondary | ICD-10-CM

## 2023-03-25 DIAGNOSIS — G8929 Other chronic pain: Secondary | ICD-10-CM | POA: Diagnosis present

## 2023-03-25 DIAGNOSIS — M79605 Pain in left leg: Secondary | ICD-10-CM | POA: Diagnosis not present

## 2023-03-25 DIAGNOSIS — Z8249 Family history of ischemic heart disease and other diseases of the circulatory system: Secondary | ICD-10-CM

## 2023-03-25 LAB — COMPREHENSIVE METABOLIC PANEL
ALT: 16 U/L (ref 0–44)
AST: 21 U/L (ref 15–41)
Albumin: 3.7 g/dL (ref 3.5–5.0)
Alkaline Phosphatase: 86 U/L (ref 38–126)
Anion gap: 14 (ref 5–15)
BUN: 18 mg/dL (ref 8–23)
CO2: 28 mmol/L (ref 22–32)
Calcium: 9.4 mg/dL (ref 8.9–10.3)
Chloride: 99 mmol/L (ref 98–111)
Creatinine, Ser: 0.96 mg/dL (ref 0.44–1.00)
GFR, Estimated: >60 mL/min (ref >60)
Glucose, Bld: 156 mg/dL — ABNORMAL HIGH (ref 70–99)
Potassium: 3.7 mmol/L (ref 3.5–5.1)
Sodium: 141 mmol/L (ref 135–145)
Total Bilirubin: 1 mg/dL (ref 0.0–1.2)
Total Protein: 7 g/dL (ref 6.5–8.1)

## 2023-03-25 LAB — URINALYSIS, ROUTINE W REFLEX MICROSCOPIC
Bacteria, UA: NONE SEEN
Bilirubin Urine: NEGATIVE
Glucose, UA: NEGATIVE mg/dL
Hgb urine dipstick: NEGATIVE
Ketones, ur: NEGATIVE mg/dL
Nitrite: NEGATIVE
Protein, ur: NEGATIVE mg/dL
Specific Gravity, Urine: 1.038 — ABNORMAL HIGH (ref 1.005–1.030)
WBC, UA: >50 WBC/hpf (ref 0–5)
pH: 5 (ref 5.0–8.0)

## 2023-03-25 LAB — RESP PANEL BY RT-PCR (RSV, FLU A&B, COVID)  RVPGX2
Influenza A by PCR: POSITIVE — AB
Influenza B by PCR: NEGATIVE
Resp Syncytial Virus by PCR: NEGATIVE
SARS Coronavirus 2 by RT PCR: NEGATIVE

## 2023-03-25 LAB — CBC
HCT: 39 % (ref 36.0–46.0)
Hemoglobin: 12 g/dL (ref 12.0–15.0)
MCH: 29.2 pg (ref 26.0–34.0)
MCHC: 30.8 g/dL (ref 30.0–36.0)
MCV: 94.9 fL (ref 80.0–100.0)
Platelets: 286 10*3/uL (ref 150–400)
RBC: 4.11 MIL/uL (ref 3.87–5.11)
RDW: 14.1 % (ref 11.5–15.5)
WBC: 7.2 10*3/uL (ref 4.0–10.5)
nRBC: 0 % (ref 0.0–0.2)

## 2023-03-25 LAB — LIPASE, BLOOD: Lipase: 24 U/L (ref 11–51)

## 2023-03-25 LAB — ACETAMINOPHEN LEVEL: Acetaminophen (Tylenol), Serum: <10 ug/mL — ABNORMAL LOW (ref 10–30)

## 2023-03-25 LAB — AMMONIA: Ammonia: <10 umol/L (ref 9–35)

## 2023-03-25 LAB — SALICYLATE LEVEL: Salicylate Lvl: <7 mg/dL — ABNORMAL LOW (ref 7.0–30.0)

## 2023-03-25 MED ORDER — SODIUM CHLORIDE 0.9 % IV SOLN: INTRAVENOUS | Status: AC

## 2023-03-25 MED ORDER — FENOFIBRATE 160 MG PO TABS
160.0000 mg | ORAL_TABLET | Freq: Every day | ORAL | Status: DC
  Administered 2023-03-26 – 2023-03-29 (×4): 160 mg via ORAL
  Filled 2023-03-25 (×4): qty 1

## 2023-03-25 MED ORDER — PANTOPRAZOLE SODIUM 40 MG PO TBEC
40.0000 mg | DELAYED_RELEASE_TABLET | Freq: Every day | ORAL | Status: DC
  Administered 2023-03-26 – 2023-03-28 (×3): 40 mg via ORAL
  Filled 2023-03-25 (×3): qty 1

## 2023-03-25 MED ORDER — DOXEPIN HCL 50 MG PO CAPS
50.0000 mg | ORAL_CAPSULE | Freq: Every day | ORAL | Status: DC
  Administered 2023-03-25 – 2023-03-28 (×4): 50 mg via ORAL
  Filled 2023-03-25 (×6): qty 1

## 2023-03-25 MED ORDER — FENOFIBRATE 54 MG PO TABS: 54.0000 mg | ORAL_TABLET | Freq: Every day | ORAL | Status: DC

## 2023-03-25 MED ORDER — SODIUM CHLORIDE 0.9 % IV BOLUS
1000.0000 mL | Freq: Once | INTRAVENOUS | Status: AC
  Administered 2023-03-25: 1000 mL via INTRAVENOUS

## 2023-03-25 MED ORDER — OSELTAMIVIR PHOSPHATE 30 MG PO CAPS
30.0000 mg | ORAL_CAPSULE | Freq: Two times a day (BID) | ORAL | Status: DC
Start: 2023-03-26 — End: 2023-03-30
  Administered 2023-03-26 – 2023-03-29 (×7): 30 mg via ORAL
  Filled 2023-03-25 (×9): qty 1

## 2023-03-25 MED ORDER — ONDANSETRON HCL 4 MG PO TABS
4.0000 mg | ORAL_TABLET | Freq: Four times a day (QID) | ORAL | Status: DC | PRN
Start: 2023-03-25 — End: 2023-03-29

## 2023-03-25 MED ORDER — SENNA 8.6 MG PO TABS: 1.0000 | ORAL_TABLET | Freq: Two times a day (BID) | ORAL | Status: DC | PRN

## 2023-03-25 MED ORDER — ASPIRIN 81 MG PO TBEC
81.0000 mg | DELAYED_RELEASE_TABLET | Freq: Every day | ORAL | Status: DC
  Administered 2023-03-26 – 2023-03-29 (×4): 81 mg via ORAL
  Filled 2023-03-25 (×4): qty 1

## 2023-03-25 MED ORDER — SODIUM CHLORIDE 0.9% FLUSH
3.0000 mL | Freq: Two times a day (BID) | INTRAVENOUS | Status: DC
  Administered 2023-03-25 – 2023-03-29 (×7): 3 mL via INTRAVENOUS

## 2023-03-25 MED ORDER — OSELTAMIVIR PHOSPHATE 75 MG PO CAPS
75.0000 mg | ORAL_CAPSULE | Freq: Once | ORAL | Status: AC
  Administered 2023-03-25: 75 mg via ORAL
  Filled 2023-03-25: qty 1

## 2023-03-25 MED ORDER — CARVEDILOL 6.25 MG PO TABS
3.1250 mg | ORAL_TABLET | Freq: Two times a day (BID) | ORAL | Status: DC
  Administered 2023-03-26 – 2023-03-29 (×7): 3.125 mg via ORAL
  Filled 2023-03-25 (×7): qty 1

## 2023-03-25 MED ORDER — ONDANSETRON HCL 4 MG/2ML IJ SOLN
4.0000 mg | Freq: Four times a day (QID) | INTRAMUSCULAR | Status: DC | PRN
  Administered 2023-03-26: 4 mg via INTRAVENOUS
  Filled 2023-03-25: qty 2

## 2023-03-25 MED ORDER — SODIUM CHLORIDE 0.9 % IV SOLN
1.0000 g | Freq: Once | INTRAVENOUS | Status: AC
  Administered 2023-03-25: 1 g via INTRAVENOUS
  Filled 2023-03-25: qty 10

## 2023-03-25 MED ORDER — ROSUVASTATIN CALCIUM 10 MG PO TABS
20.0000 mg | ORAL_TABLET | Freq: Every day | ORAL | Status: DC
  Administered 2023-03-26 – 2023-03-29 (×4): 20 mg via ORAL
  Filled 2023-03-25 (×3): qty 2
  Filled 2023-03-25: qty 1

## 2023-03-25 MED ORDER — ACETAMINOPHEN 500 MG PO TABS
500.0000 mg | ORAL_TABLET | Freq: Four times a day (QID) | ORAL | Status: DC | PRN
  Filled 2023-03-25: qty 1

## 2023-03-25 MED ORDER — BENZONATATE 100 MG PO CAPS
200.0000 mg | ORAL_CAPSULE | Freq: Three times a day (TID) | ORAL | Status: DC | PRN
  Administered 2023-03-28: 200 mg via ORAL
  Filled 2023-03-25: qty 2

## 2023-03-25 MED ORDER — SODIUM CHLORIDE 0.9 % IV SOLN
1.0000 g | INTRAVENOUS | Status: DC
  Administered 2023-03-26: 1 g via INTRAVENOUS
  Filled 2023-03-25 (×2): qty 10

## 2023-03-25 MED ORDER — ENOXAPARIN SODIUM 40 MG/0.4ML IJ SOSY
40.0000 mg | PREFILLED_SYRINGE | INTRAMUSCULAR | Status: DC
  Administered 2023-03-25 – 2023-03-28 (×4): 40 mg via SUBCUTANEOUS
  Filled 2023-03-25 (×4): qty 0.4

## 2023-03-25 MED ORDER — DILTIAZEM HCL ER COATED BEADS 120 MG PO CP24
120.0000 mg | ORAL_CAPSULE | Freq: Every day | ORAL | Status: DC
  Administered 2023-03-26 – 2023-03-29 (×4): 120 mg via ORAL
  Filled 2023-03-25 (×4): qty 1

## 2023-03-25 MED ORDER — ALBUTEROL SULFATE (2.5 MG/3ML) 0.083% IN NEBU: 2.5000 mg | INHALATION_SOLUTION | RESPIRATORY_TRACT | Status: DC | PRN

## 2023-03-25 MED ORDER — LEVOTHYROXINE SODIUM 100 MCG PO TABS
100.0000 ug | ORAL_TABLET | Freq: Every day | ORAL | Status: DC
  Administered 2023-03-26 – 2023-03-29 (×4): 100 ug via ORAL
  Filled 2023-03-25: qty 1
  Filled 2023-03-25 (×2): qty 2
  Filled 2023-03-25: qty 1

## 2023-03-25 MED ORDER — IOHEXOL 300 MG/ML  SOLN
100.0000 mL | Freq: Once | INTRAMUSCULAR | Status: AC | PRN
  Administered 2023-03-25: 100 mL via INTRAVENOUS

## 2023-03-25 MED ORDER — FLUTICASONE FUROATE-VILANTEROL 100-25 MCG/ACT IN AEPB
1.0000 | INHALATION_SPRAY | Freq: Every day | RESPIRATORY_TRACT | Status: DC
  Administered 2023-03-26 – 2023-03-29 (×4): 1 via RESPIRATORY_TRACT
  Filled 2023-03-25 (×2): qty 28

## 2023-03-25 MED ORDER — CITALOPRAM HYDROBROMIDE 20 MG PO TABS
20.0000 mg | ORAL_TABLET | Freq: Every day | ORAL | Status: DC
  Administered 2023-03-26 – 2023-03-29 (×4): 20 mg via ORAL
  Filled 2023-03-25: qty 2
  Filled 2023-03-25 (×3): qty 1

## 2023-03-25 MED ORDER — OSELTAMIVIR PHOSPHATE 30 MG PO CAPS: 30.0000 mg | ORAL_CAPSULE | Freq: Two times a day (BID) | ORAL | Status: DC

## 2023-03-25 MED ORDER — VITAMIN D (ERGOCALCIFEROL) 1.25 MG (50000 UNIT) PO CAPS
50000.0000 [IU] | ORAL_CAPSULE | ORAL | Status: DC
  Administered 2023-03-28: 50000 [IU] via ORAL
  Filled 2023-03-25: qty 1

## 2023-03-25 NOTE — ED Notes (Signed)
Pt moved to room 32 cpod   report off to jazmin rn cpod nurse

## 2023-03-25 NOTE — ED Notes (Signed)
Pt alert  family with pt 

## 2023-03-25 NOTE — ED Notes (Signed)
Pt taken to restroom. Pt assisted up to toilet. Pt passed gas but no BM and did not urinate. Pt cleaned up and placed in clean underwear, pads and pants.

## 2023-03-25 NOTE — Progress Notes (Signed)
PHARMACY NOTE:  ANTIMICROBIAL RENAL DOSAGE ADJUSTMENT  Current antimicrobial regimen includes a mismatch between antimicrobial dosage and estimated renal function.  As per policy approved by the Pharmacy & Therapeutics and Medical Executive Committees, the antimicrobial dosage will be adjusted accordingly.  Current antimicrobial dosage: Tamiflu 30 mg PO BID x 10 doses  Indication: Flu treatment  Renal Function:  Estimated Creatinine Clearance: 53.1 mL/min (by C-G formula based on SCr of 0.96 mg/dL).    Antimicrobial dosage has been changed to:  Tamiflu 75 mg PO x 1 dose followed by 30 mg BID x 9 doses   Thank you for allowing pharmacy to be a part of this patient's care.  Tressie Ellis, [facility] 03/25/2023 8:59 PM

## 2023-03-25 NOTE — ED Triage Notes (Signed)
Pt in via [facility] from home, reports an episode this morning of confusion, thinking that she had plans w/ some old friends which are now deceased.  Last known well March 29, 2023 around 2100.  Patient is now A/Ox4, vitals WDL.    Per son, patient w/ recent back surgery x 3 weeks ago, has had multiple falls since.

## 2023-03-25 NOTE — ED Triage Notes (Signed)
Per EMS, Pt, from home, presents w/ AMS/increased confusion.  Pt lives alone and family was having a hard time contacting her. A &Ox3.  Vitals:  HR: 100 RR: 16 BP: 150/65 O2: 92% RA  CBG: 186

## 2023-03-25 NOTE — ED Provider Notes (Signed)
[facility] Provider Note    Event Date/Time   First MD Initiated Contact with Patient 03/25/23 1553     (approximate)   History   Altered Mental Status   HPI [name] is a 73 y.o. female with history of HTN, HLD, DM2, anxiety, COPD presenting today for concerns of altered mental status.  Patient is here with family members after they noted her having intermittent confusion at home as well as increasing weakness.  She had surgery on her lumbar spine approximately 3 weeks ago and lives alone at home right now.  They have noted her having intermittent confusion as well as difficulty taking care of herself.  No known history of dementia that they are aware of.  They do note she has depression and anxiety.  Patient states she has had several falls recently but denies any head injury or neck injury.  She denies pain symptoms anywhere at this time.  No nausea, vomiting, diarrhea, dysuria.  Does feel like she has to urinate frequently.  No constipation, diarrhea, chest pain, shortness of breath.  Has had prior pneumonias as well as UTIs in the past which have caused altered mental status.     Physical Exam   Triage Vital Signs: ED Triage Vitals  Encounter Vitals Group     BP 03/25/23 1044 (!) 149/75     Systolic BP Percentile --      Diastolic BP Percentile --      Pulse Rate 03/25/23 1044 95     Resp 03/25/23 1044 18     Temp 03/25/23 1044 97.6 F (36.4 C)     Temp Source 03/25/23 1044 Oral     SpO2 03/25/23 1044 98 %     Weight 03/25/23 1046 187 lb (84.8 kg)     Height 03/25/23 1046 5\' 3"  (1.6 m)     Head Circumference --      Peak Flow --      Pain Score 03/25/23 1045 8     Pain Loc --      Pain Education --      Exclude from Growth Chart --     Most recent vital signs: Vitals:   03/25/23 1930 03/25/23 2035  BP: (!) 141/81   Pulse: 98   Resp: (!) 29   Temp:  98.4 F (36.9 C)  SpO2: 100%    Physical Exam: I have reviewed the vital signs  and nursing notes. General: Awake, alert, no acute distress.  Nontoxic appearing. Head:  Atraumatic, normocephalic.   ENT:  EOM intact, PERRL. Oral mucosa is pink and moist with no lesions. Neck: Neck is supple with full range of motion, No meningeal signs. Cardiovascular:  RRR, No murmurs. Peripheral pulses palpable and equal bilaterally. Respiratory:  Symmetrical chest wall expansion.  No rhonchi, rales, or wheezes.  Good air movement throughout.  No use of accessory muscles.   Musculoskeletal:  No cyanosis or edema. Moving extremities with full ROM Abdomen:  Soft, distended abdomen with tenderness to palpation throughout the lower regions Neuro:  GCS 15, moving all four extremities, interacting appropriately. Speech clear.  Cranial nerves II through XII intact.  Sensation equal and intact throughout bilateral upper and lower extremities.  5 out of 5 strength throughout bilateral upper and lower extremities. Psych:  Calm, appropriate.   Skin:  Warm, dry, no rash.    ED Results / Procedures / Treatments   Labs (all labs ordered are listed, but only abnormal results are  displayed) Labs Reviewed  RESP PANEL BY RT-PCR (RSV, FLU A&B, COVID)  RVPGX2 - Abnormal; Notable for the following components:      Result Value   Influenza A by PCR POSITIVE (*)    All other components within normal limits  COMPREHENSIVE METABOLIC PANEL - Abnormal; Notable for the following components:   Glucose, Bld 156 (*)    All other components within normal limits  URINALYSIS, ROUTINE W REFLEX MICROSCOPIC - Abnormal; Notable for the following components:   Color, Urine YELLOW (*)    APPearance CLOUDY (*)    Specific Gravity, Urine 1.038 (*)    Leukocytes,Ua MODERATE (*)    All other components within normal limits  ACETAMINOPHEN LEVEL - Abnormal; Notable for the following components:   Acetaminophen (Tylenol), Serum <10 (*)    All other components within normal limits  SALICYLATE LEVEL - Abnormal; Notable for  the following components:   Salicylate Lvl <7.0 (*)    All other components within normal limits  CULTURE, BLOOD (ROUTINE X 2)  CULTURE, BLOOD (ROUTINE X 2)  URINE CULTURE  CBC  AMMONIA  LIPASE, BLOOD  BASIC METABOLIC PANEL  CBC     EKG My EKG interpretation: Rate of 96, normal sinus rhythm, normal axis, normal intervals.  No acute ST elevations or depressions   RADIOLOGY Independently interpreted chest x-ray, CT abdomen/pelvis, and ultrasound of left lower extremity with no obvious acute pathology   PROCEDURES:  Critical Care performed: No  Procedures   MEDICATIONS ORDERED IN ED: Medications  sodium chloride 0.9 % bolus 1,000 mL (has no administration in time range)  cefTRIAXone (ROCEPHIN) 1 g in sodium chloride 0.9 % 100 mL IVPB (has no administration in time range)  acetaminophen (TYLENOL) tablet 500 mg (has no administration in time range)  aspirin EC tablet 81 mg (has no administration in time range)  carvedilol (COREG) tablet 3.125 mg (has no administration in time range)  diltiazem (CARDIZEM CD) 24 hr capsule 120 mg (has no administration in time range)  fenofibrate tablet 54 mg (has no administration in time range)  rosuvastatin (CRESTOR) tablet 20 mg (has no administration in time range)  doxepin (SINEQUAN) capsule 50 mg (has no administration in time range)  citalopram (CELEXA) tablet 20 mg (has no administration in time range)  levothyroxine (SYNTHROID) tablet 100 mcg (has no administration in time range)  pantoprazole (PROTONIX) EC tablet 40 mg (has no administration in time range)  senna (SENOKOT) tablet 8.6 mg (has no administration in time range)  Vitamin D (Ergocalciferol) (DRISDOL) 1.25 MG (50000 UNIT) capsule 50,000 Units (has no administration in time range)  fluticasone furoate-vilanterol (BREO ELLIPTA) 100-25 MCG/ACT 1 puff (has no administration in time range)  enoxaparin (LOVENOX) injection 40 mg (has no administration in time range)  sodium  chloride flush (NS) 0.9 % injection 3 mL (has no administration in time range)  ondansetron (ZOFRAN) tablet 4 mg (has no administration in time range)    Or  ondansetron (ZOFRAN) injection 4 mg (has no administration in time range)  albuterol (PROVENTIL) (2.5 MG/3ML) 0.083% nebulizer solution 2.5 mg (has no administration in time range)  iohexol (OMNIPAQUE) 300 MG/ML solution 100 mL (100 mLs Intravenous Contrast Given 03/25/23 1719)     IMPRESSION / MDM / ASSESSMENT AND PLAN / ED COURSE  I reviewed the triage vital signs and the nursing notes.  Differential diagnosis includes, but is not limited to, UTI, pneumonia, viral URI, dehydration, electrolyte abnormality, diverticulitis, appendicitis, colitis, enteritis  Patient's presentation is most consistent with acute presentation with potential threat to life or bodily function.  Patient is a 73 year old female presenting today for concerns of altered mental status and weakness.  Exam most notable for tenderness in the lower abdominal region but vital signs otherwise stable.  Laboratory workup with CBC, CMP, ammonia, lipase, acetaminophen, salicylate all normal.  Noted some swelling in the left lower extremity and ultrasound was ordered which shows no evidence of a DVT.  CT head unremarkable.  CT abdomen/pelvis shows no acute intra-abdominal pathology.  Ultimately, patient had a positive influenza A test as well as an equivocal UA.  Given some of her lower abdominal tenderness and polyuria symptoms, will treat with ceftriaxone.  Given her altered mental status and profound weakness with multiple falls, will admit to hospitalist for further care.  The patient is on the cardiac monitor to evaluate for evidence of arrhythmia and/or significant heart rate changes. Clinical Course as of 03/25/23 2055  Mon Mar 25, 2023  1818 Urinalysis, Routine w reflex microscopic -Urine, Clean Catch(!) Dirty UA - equivocal UTI but patient  is having polyuria [DW]    Clinical Course User Index [DW] Janith Lima, MD     FINAL CLINICAL IMPRESSION(S) / ED DIAGNOSES   Final diagnoses:  Altered mental status, unspecified altered mental status type  Influenza A  Acute cystitis with hematuria     Rx / DC Orders   ED Discharge Orders     None        Note:  This document was prepared using Dragon voice recognition software and may include unintentional dictation errors.   Janith Lima, MD 03/25/23 [phone]

## 2023-03-25 NOTE — H&P (Signed)
History and Physical    Patient: [name] WUJ:[id number] DOB: 1950-10-30 DOA: 03/25/2023 DOS: the patient was seen and examined on 03/25/2023 PCP: Lorre Munroe, NP  Patient coming from: Home  Chief Complaint:  Chief Complaint  Patient presents with   Altered Mental Status   HPI: [name] is a 73 y.o. female with medical history significant of hypertension, hypothyroidism, COPD, GERD, arthritis, psoriasis on monoclonal antibody injections, anxiety disorder, peripheral vascular disease, status post recent laminectomy on 01/23.  Patient was brought in by family on account of frequent falls and generalized weakness. Patient was also reported to be altered in mental status from baseline.She reports visual hallucinations,increased forgetfulness and frequent falls.  At baseline, patient lives by herself.  Med rec reviewed shows patient was on tramadol and Flexeril, likely for pain optimization protocol.  Review of Systems: As mentioned in the history of present illness. All other systems reviewed and are negative. Past Medical History:  Diagnosis Date   Anxiety    Arthritis    Chronic back pain    COPD (chronic obstructive pulmonary disease) (HCC)    Depression    GERD (gastroesophageal reflux disease)    Hypertension    Hypothyroidism    Neuromuscular disorder (HCC)    Pneumonia    RECENT X 3   PONV (postoperative nausea and vomiting)    Psoriasis    Thyroid disease    Past Surgical History:  Procedure Laterality Date   ABDOMINAL HYSTERECTOMY     heavy bleeding   bladder stimulator     CARPAL TUNNEL RELEASE     CATARACT EXTRACTION W/PHACO Right 03/07/2015   Procedure: CATARACT EXTRACTION PHACO AND INTRAOCULAR LENS PLACEMENT (IOC);  Surgeon: Sallee Lange, MD;  Location: [facility] ORS;  Service: Ophthalmology;  Laterality: Right;  [name] 01:14 AP% 26.4 CDE 33.04 fluid pack lot # [id number]   CATARACT EXTRACTION W/PHACO Left 03/21/2015   Procedure: CATARACT EXTRACTION PHACO AND  INTRAOCULAR LENS PLACEMENT (IOC);  Surgeon: Sallee Lange, MD;  Location: [facility] ORS;  Service: Ophthalmology;  Laterality: Left;  [name] 01:21 AP% 25.6 CDE 39.78 fluid pack lot # [id number]   HAND SURGERY Right    INTERSTIM IMPLANT REMOVAL N/A 09/07/2015   Procedure: REMOVAL OF INTERSTIM IMPLANT;  Surgeon: Alfredo Martinez, MD;  Location: [facility] ORS;  Service: Urology;  Laterality: N/A;   LUMBAR LAMINECTOMY/DECOMPRESSION MICRODISCECTOMY N/A 02/28/2023   Procedure: L3-4 LAMINECTOMY, L4-5 LAMINECTOMY AND DISCECTOMY OF LEFT SIDE, L5-S1 LAMINECTOMY;  Surgeon: Lovenia Kim, MD;  Location: [facility] ORS;  Service: Neurosurgery;  Laterality: N/A;   OOPHORECTOMY Right    ovarian cyst   Social History:  reports that she quit smoking about 2 years ago. Her smoking use included cigarettes. She started smoking about 4 years ago. She has a 35 pack-year smoking history. She has never used smokeless tobacco. She reports that she does not drink alcohol and does not use drugs.  Allergies  Allergen Reactions   Ciprofloxacin Hives and Diarrhea   Gabapentin Other (See Comments)    Hallucination   Trazodone And Nefazodone Other (See Comments)    Altered Mental Status    Mucinex [Guaifenesin Er] Nausea Only   Oxycodone Diarrhea   Oxycodone-Acetaminophen Diarrhea   Oxycodone-Acetaminophen Palpitations   Sulfa Antibiotics Rash    Throat, mouth Throat, mouth    Family History  Problem Relation Age of Onset   Heart disease Mother    Breast cancer Mother    Heart attack Mother    COPD Father  Stroke Paternal Uncle    Kidney disease Sister    Breast cancer Cousin    Breast cancer Cousin    Heart attack Maternal Aunt    Stroke Paternal Grandfather    Kidney cancer Neg Hx    Bladder Cancer Neg Hx     Prior to Admission medications   Medication Sig Start Date End Date Taking? Authorizing Provider  doxycycline (VIBRAMYCIN) 100 MG capsule Take 1 capsule by mouth 2 (two) times daily. 03/23/23 03/30/23 Yes  [provider]  pantoprazole (PROTONIX) 40 MG tablet Take 1 tablet by mouth every evening. 11/30/22  Yes [provider]  acetaminophen (TYLENOL) 500 MG tablet Take 500 mg by mouth every 6 (six) hours as needed for moderate pain.     [provider]  albuterol (VENTOLIN HFA) 108 (90 Base) MCG/ACT inhaler INHALE 2 PUFFS BY MOUTH EVERY 6 HOURS AS NEEDED FOR WHEEZING OR SHORTNESS OF BREATH 04/16/22   Sunnie Nielsen, DO  aspirin EC (ASPIRIN LOW DOSE) 81 MG tablet TAKE 1 TABLET(81 MG) BY MOUTH DAILY. SWALLOW WHOLE 11/07/22   Lorre Munroe, NP  carvedilol (COREG) 3.125 MG tablet TAKE 1 TABLET BY MOUTH TWICE DAILY WITH A MEAL 02/07/23   Lorre Munroe, NP  citalopram (CELEXA) 40 MG tablet TAKE 1 TABLET(40 MG) BY MOUTH DAILY 12/13/22   Lorre Munroe, NP  clobetasol cream (TEMOVATE) 0.05 % Apply 1 Application topically as needed.    Pharmacy, MD  clonazePAM (KLONOPIN) 1 MG tablet TAKE 1 TABLET BY MOUTH EVERY MORNING, 1/2 TABLET EVERY AFTERNOON AND 1 TABLET EVERY NIGHT AT BEDTIME 03/12/23   Lorre Munroe, NP  cyclobenzaprine (FLEXERIL) 10 MG tablet Take 1 tablet (10 mg total) by mouth 3 (three) times daily as needed for muscle spasms. 03/08/23   Susanne Borders, PA  diltiazem (CARDIZEM CD) 120 MG 24 hr capsule TAKE 1 CAPSULE BY MOUTH EVERY DAY 02/07/23   Lorre Munroe, NP  doxepin (SINEQUAN) 25 MG capsule Take 50 mg by mouth at bedtime. 11/01/22   [provider]  fenofibrate (TRICOR) 145 MG tablet Take 1 tablet (145 mg total) by mouth daily. 02/08/23   Lorre Munroe, NP  fluticasone furoate-vilanterol (BREO ELLIPTA) 100-25 MCG/ACT AEPB Inhale 1 puff into the lungs daily. 04/16/22   Sunnie Nielsen, DO  Fluticasone-Umeclidin-Vilant (TRELEGY ELLIPTA) 100-62.5-25 MCG/ACT AEPB Inhale 1 puff into the lungs daily.    [provider]  hydrOXYzine (ATARAX) 25 MG tablet Take 25 mg by mouth at bedtime. 01/24/21   [provider]  ipratropium-albuterol  (DUONEB) 0.5-2.5 (3) MG/3ML SOLN Take 3 mLs by nebulization every 6 (six) hours as needed (wheezing / shortness of breath). 04/16/22   Sunnie Nielsen, DO  levothyroxine (SYNTHROID) 100 MCG tablet Take 1 tablet (100 mcg total) by mouth daily. 10/04/22   Lorre Munroe, NP  lisinopril (ZESTRIL) 10 MG tablet TAKE 1 TABLET(10 MG) BY MOUTH DAILY 02/07/23   Lorre Munroe, NP  Misc. Devices (QUAD CANE/SMALL BASE) MISC 1 Device by Does not apply route daily. 06/26/17   Galen Manila, NP  pantoprazole (PROTONIX) 40 MG tablet SMARTSIG:1 Tablet(s) By Mouth Every Evening 11/30/22   [provider]  rosuvastatin (CRESTOR) 20 MG tablet Take 1 tablet (20 mg total) by mouth daily. 08/30/22   Lorre Munroe, NP  senna (SENOKOT) 8.6 MG TABS tablet Take 1 tablet (8.6 mg total) by mouth 2 (two) times daily as needed for mild constipation. 03/08/23   Susanne Borders,  PA  traMADol (ULTRAM) 50 MG tablet Take 1 tablet (50 mg total) by mouth every 6 (six) hours. 03/08/23   Susanne Borders, PA  triamcinolone ointment (KENALOG) 0.1 % Apply 1 Application topically as needed.    Pharmacy, MD  Vitamin D, Ergocalciferol, (DRISDOL) 1.25 MG (50000 UNIT) CAPS capsule Take 50,000 Units by mouth 2 (two) times a week. 03/16/21   [provider]    Physical Exam: Vitals:   03/25/23 1046 03/25/23 1610 03/25/23 1611 03/25/23 1930  BP:  (!) 143/75  (!) 141/81  Pulse:  99  98  Resp:  16  (!) 29  Temp:   98.4 F (36.9 C)   TempSrc:   Oral   SpO2:  97%  100%  Weight: 84.8 kg     Height: 5\' 3"  (1.6 m)      Constitutional: Obese,ill appearing, mildly dehydrated in no acute distress Eyes: PERRL, EOMI, conjunctiva normal bilaterally HENT: Normocephalic, atraumatic,mucus membranes moist. Normal buccal mucosa. Normal tongue. No evidence of thrush. Normal oropharynx. Uvula midline. No postnasal drip. Neck: No cervical lymphadenopathy Respiratory: Diminished breath sounds to auscultation bilaterally, no  rales, no wheezing, no rhonchi Cardiovascular: Normal rate and rhythm, no murmurs, no gallops, no rubs GI: Nondistended skin: See MSK exam Musculoskeletal: Left foot: Diffuse dorsal swelling, diffuse tenderness over foot and ankle. No increased temperature. Violaceous/erythematous coloration, which improves with elevation. Pain with all range of motion of the ankle, patient states this is not new. Cap refill in toes approximately 2-3 seconds. PT 1+. Skin intact.   Calves symmetric, nontender, no edema, no palpable cord.  Neurologic: Alert & oriented x 3, CN III-XII grossly intact, no motor deficits, sensation grossly intact Psychiatric: Speech and behavior appropriate  Data Reviewed: Labs reviewed shows sodium 141, potassium 3.7, chloride 99, bicarb 28, glucose 156, creatinine 0.96, lipase 24, ammonia less than 10.  WBC 7.2, hemoglobin 12, hematocrit 39, platelet count 286, acetaminophen less than 10, salicylate less than 7.  Last A1c was 6.4.  Patient tested positive for influenza A.  Urinalysis was also significant for cloudy urine with moderate leukocytes esterase, greater than 50 WBC count.    Assessment and Plan:  73 year old female with multiple medical comorbidities that include COPD, anxiety disorder, chronic arthritis, status post recent laminectomy who is on Flexeril and tramadol for pain control.  Presents from home on account of altered mental status and frequent falls at home.  Patient incidentally found to have influenza A infection and abnormal urinalysis suggesting UTI.  Acute Metabolic encephalopathy: Likely medication induced.  Likely culprits include Flexeril and tramadol. CT head was unremarkable. Patient has been on medications since her discharge 3 weeks prior.  Acute infections including UTI and influenza may also be contributory.  Will hold off on Flexeril and tramadol for tonight and reevaluate in a.m.  Acute urinary tract infection: Likely acute cystitis.  Present on  admission Patient will be empirically covered with IV antibiotics with Rocephin.  Urine cultures pending.  Will tailor antibiotics accordingly pending culture and sensitivity.  Acute influenza A infection.  Patient will be treated with oseltamivir.  Onset of symptoms 2 days prior.  Chest x-ray has been ordered to rule out any brewing pneumonia  COPD: Not in any acute exacerbation at this time.  Bronchodilators as needed.  Continue with long-acting beta agonist/inhaled corticosteroids  Hypothyroidism, GERD: Continue with home medications.  Psoriasis: Chronic.  Patient is on monoclonal antibody injections.  High risk of immunocompromise  Status post laminectomy on 01/23-clinically stable.  Physical therapy and Occupational Therapy to work with patient to establish gait stability.  Right foot pain: Seen and recently treated by PCP. Xray per patient was reported to be unremarkable for fracture. Patient was treated for suspected gout flare.LLE Duplex was negative for DVT   Advance Care Planning:   Code Status: Full Code   Consults:   Family Communication:   Severity of Illness: The appropriate patient status for this patient is OBSERVATION. Observation status is judged to be reasonable and necessary in order to provide the required intensity of service to ensure the patient's safety. The patient's presenting symptoms, physical exam findings, and initial radiographic and laboratory data in the context of their medical condition is felt to place them at decreased risk for further clinical deterioration. Furthermore, it is anticipated that the patient will be medically stable for discharge from the hospital within 2 midnights of admission.   Author: Lilia Pro, MD 03/25/2023 8:23 PM  For on call review [email or web address].

## 2023-03-25 NOTE — ED Provider Triage Note (Signed)
Emergency Medicine Provider Triage Evaluation Note  [name] , a 73 y.o. female  was evaluated in triage.  Pt complains of altered mental status, left leg swelling, recent surgery.  Review of Systems  Positive:  Negative:   Physical Exam  There were no vitals taken for this visit. Gen:   Awake, no distress   Resp:  Normal effort  MSK:   Moves extremities without difficulty  Other:    Medical Decision Making  Medically screening exam initiated at 10:40 AM.  Appropriate orders placed.  [name] was informed that the remainder of the evaluation will be completed by another provider, this initial triage assessment does not replace that evaluation, and the importance of remaining in the ED until their evaluation is complete.  Last known well was yesterday   Faythe Ghee, PA-C 03/25/23 1041

## 2023-03-25 NOTE — ED Notes (Signed)
Pt up to bathroom with walker and assistance.

## 2023-03-25 NOTE — ED Notes (Addendum)
Md in with pt and family  pt alert and oriented.  Pt reports recent falls, weakness and some confusion.  Pt had recent back surgery.   Pt states she feels anxious and nervous at this time.  Pt also reports a headache.  No n/v/  denies chest pain of sob.  Pt denies abd pain.  Pt alert

## 2023-03-25 NOTE — ED Notes (Signed)
Dr wells in with pt and family

## 2023-03-25 NOTE — Progress Notes (Signed)
Attempted to help pt to [facility], Pt unable to stand and walk due to weakness.   0109 NP Jawo made aware that pt went into Afib RVR.    0150 EKG done, NP at bedside. Tylenol, a bolus of NS, and Cardizem IV push x1 ordered by provider.

## 2023-03-26 ENCOUNTER — Encounter: Payer: Self-pay | Admitting: Internal Medicine

## 2023-03-26 DIAGNOSIS — E1122 Type 2 diabetes mellitus with diabetic chronic kidney disease: Secondary | ICD-10-CM | POA: Diagnosis not present

## 2023-03-26 DIAGNOSIS — E1151 Type 2 diabetes mellitus with diabetic peripheral angiopathy without gangrene: Secondary | ICD-10-CM | POA: Diagnosis not present

## 2023-03-26 DIAGNOSIS — F419 Anxiety disorder, unspecified: Secondary | ICD-10-CM | POA: Diagnosis not present

## 2023-03-26 DIAGNOSIS — J09X2 Influenza due to identified novel influenza A virus with other respiratory manifestations: Secondary | ICD-10-CM | POA: Diagnosis not present

## 2023-03-26 DIAGNOSIS — I10 Essential (primary) hypertension: Secondary | ICD-10-CM | POA: Diagnosis not present

## 2023-03-26 DIAGNOSIS — Z7982 Long term (current) use of aspirin: Secondary | ICD-10-CM | POA: Diagnosis not present

## 2023-03-26 DIAGNOSIS — K219 Gastro-esophageal reflux disease without esophagitis: Secondary | ICD-10-CM | POA: Diagnosis not present

## 2023-03-26 DIAGNOSIS — I4891 Unspecified atrial fibrillation: Secondary | ICD-10-CM | POA: Diagnosis not present

## 2023-03-26 DIAGNOSIS — F32A Depression, unspecified: Secondary | ICD-10-CM | POA: Diagnosis not present

## 2023-03-26 DIAGNOSIS — J101 Influenza due to other identified influenza virus with other respiratory manifestations: Secondary | ICD-10-CM | POA: Diagnosis not present

## 2023-03-26 DIAGNOSIS — G9341 Metabolic encephalopathy: Secondary | ICD-10-CM | POA: Diagnosis not present

## 2023-03-26 DIAGNOSIS — F410 Panic disorder [episodic paroxysmal anxiety] without agoraphobia: Secondary | ICD-10-CM | POA: Diagnosis not present

## 2023-03-26 DIAGNOSIS — Z881 Allergy status to other antibiotic agents status: Secondary | ICD-10-CM | POA: Diagnosis not present

## 2023-03-26 DIAGNOSIS — E7800 Pure hypercholesterolemia, unspecified: Secondary | ICD-10-CM | POA: Diagnosis not present

## 2023-03-26 DIAGNOSIS — Z87891 Personal history of nicotine dependence: Secondary | ICD-10-CM | POA: Diagnosis not present

## 2023-03-26 DIAGNOSIS — Z8249 Family history of ischemic heart disease and other diseases of the circulatory system: Secondary | ICD-10-CM | POA: Diagnosis not present

## 2023-03-26 DIAGNOSIS — T40425A Adverse effect of tramadol, initial encounter: Secondary | ICD-10-CM | POA: Diagnosis not present

## 2023-03-26 DIAGNOSIS — E669 Obesity, unspecified: Secondary | ICD-10-CM | POA: Diagnosis not present

## 2023-03-26 DIAGNOSIS — Z1152 Encounter for screening for COVID-19: Secondary | ICD-10-CM | POA: Diagnosis not present

## 2023-03-26 DIAGNOSIS — M10071 Idiopathic gout, right ankle and foot: Secondary | ICD-10-CM | POA: Diagnosis not present

## 2023-03-26 DIAGNOSIS — G928 Other toxic encephalopathy: Secondary | ICD-10-CM | POA: Diagnosis not present

## 2023-03-26 DIAGNOSIS — E039 Hypothyroidism, unspecified: Secondary | ICD-10-CM | POA: Diagnosis not present

## 2023-03-26 DIAGNOSIS — M1990 Unspecified osteoarthritis, unspecified site: Secondary | ICD-10-CM | POA: Diagnosis not present

## 2023-03-26 DIAGNOSIS — G4700 Insomnia, unspecified: Secondary | ICD-10-CM | POA: Diagnosis not present

## 2023-03-26 DIAGNOSIS — M961 Postlaminectomy syndrome, not elsewhere classified: Secondary | ICD-10-CM | POA: Diagnosis not present

## 2023-03-26 DIAGNOSIS — J449 Chronic obstructive pulmonary disease, unspecified: Secondary | ICD-10-CM | POA: Diagnosis not present

## 2023-03-26 DIAGNOSIS — T481X5A Adverse effect of skeletal muscle relaxants [neuromuscular blocking agents], initial encounter: Secondary | ICD-10-CM | POA: Diagnosis not present

## 2023-03-26 DIAGNOSIS — Z79620 Long term (current) use of immunosuppressive biologic: Secondary | ICD-10-CM | POA: Diagnosis not present

## 2023-03-26 DIAGNOSIS — E559 Vitamin D deficiency, unspecified: Secondary | ICD-10-CM | POA: Diagnosis not present

## 2023-03-26 DIAGNOSIS — N1831 Chronic kidney disease, stage 3a: Secondary | ICD-10-CM | POA: Diagnosis not present

## 2023-03-26 DIAGNOSIS — F321 Major depressive disorder, single episode, moderate: Secondary | ICD-10-CM | POA: Diagnosis not present

## 2023-03-26 DIAGNOSIS — N3001 Acute cystitis with hematuria: Secondary | ICD-10-CM | POA: Diagnosis not present

## 2023-03-26 DIAGNOSIS — R296 Repeated falls: Secondary | ICD-10-CM | POA: Diagnosis not present

## 2023-03-26 DIAGNOSIS — L409 Psoriasis, unspecified: Secondary | ICD-10-CM | POA: Diagnosis not present

## 2023-03-26 DIAGNOSIS — M79671 Pain in right foot: Secondary | ICD-10-CM | POA: Diagnosis not present

## 2023-03-26 DIAGNOSIS — I120 Hypertensive chronic kidney disease with stage 5 chronic kidney disease or end stage renal disease: Secondary | ICD-10-CM | POA: Diagnosis not present

## 2023-03-26 DIAGNOSIS — Z79890 Hormone replacement therapy (postmenopausal): Secondary | ICD-10-CM | POA: Diagnosis not present

## 2023-03-26 DIAGNOSIS — N390 Urinary tract infection, site not specified: Secondary | ICD-10-CM | POA: Diagnosis not present

## 2023-03-26 DIAGNOSIS — E785 Hyperlipidemia, unspecified: Secondary | ICD-10-CM | POA: Diagnosis not present

## 2023-03-26 LAB — CBC
HCT: 35.5 % — ABNORMAL LOW (ref 36.0–46.0)
Hemoglobin: 10.9 g/dL — ABNORMAL LOW (ref 12.0–15.0)
MCH: 29.4 pg (ref 26.0–34.0)
MCHC: 30.7 g/dL (ref 30.0–36.0)
MCV: 95.7 fL (ref 80.0–100.0)
Platelets: 212 10*3/uL (ref 150–400)
RBC: 3.71 MIL/uL — ABNORMAL LOW (ref 3.87–5.11)
RDW: 14.2 % (ref 11.5–15.5)
WBC: 3.8 10*3/uL — ABNORMAL LOW (ref 4.0–10.5)
nRBC: 0 % (ref 0.0–0.2)

## 2023-03-26 LAB — BASIC METABOLIC PANEL
Anion gap: 11 (ref 5–15)
BUN: 12 mg/dL (ref 8–23)
CO2: 27 mmol/L (ref 22–32)
Calcium: 8.4 mg/dL — ABNORMAL LOW (ref 8.9–10.3)
Chloride: 104 mmol/L (ref 98–111)
Creatinine, Ser: 0.94 mg/dL (ref 0.44–1.00)
GFR, Estimated: >60 mL/min (ref >60)
Glucose, Bld: 131 mg/dL — ABNORMAL HIGH (ref 70–99)
Potassium: 3.3 mmol/L — ABNORMAL LOW (ref 3.5–5.1)
Sodium: 142 mmol/L (ref 135–145)

## 2023-03-26 MED ORDER — DILTIAZEM HCL 25 MG/5ML IV SOLN
INTRAVENOUS | Status: AC
  Administered 2023-03-26: 10 mg via INTRAVENOUS
  Filled 2023-03-26: qty 5

## 2023-03-26 MED ORDER — DILTIAZEM HCL 25 MG/5ML IV SOLN: 10.0000 mg | Freq: Once | INTRAVENOUS | Status: AC

## 2023-03-26 MED ORDER — SODIUM CHLORIDE 0.9 % IV BOLUS
1000.0000 mL | Freq: Once | INTRAVENOUS | Status: AC
  Administered 2023-03-26: 1000 mL via INTRAVENOUS

## 2023-03-26 MED ORDER — ACETAMINOPHEN 500 MG PO TABS
500.0000 mg | ORAL_TABLET | Freq: Four times a day (QID) | ORAL | Status: DC | PRN
  Administered 2023-03-26 – 2023-03-28 (×2): 500 mg via ORAL
  Filled 2023-03-26: qty 1

## 2023-03-26 NOTE — ED Notes (Signed)
Pt was attempting to get out of bed, this is the 3-4thx this shift. Assisted Pts back into bed, brief and chucks changed at this time with assistance

## 2023-03-26 NOTE — Progress Notes (Signed)
Progress Note   Patient: [name] NWG:[id number] DOB: [birth year] DOA: 03/25/2023     0 DOS: the patient was seen and examined on 03/26/2023   Brief hospital course:  [name] is a 73 y.o. female with medical history significant of hypertension, hypothyroidism, COPD, GERD, arthritis, psoriasis on monoclonal antibody injections, anxiety disorder, peripheral vascular disease, status post recent laminectomy on 01/23.  Patient was brought in by family on account of frequent falls and generalized weakness with altered mentation.  Patient currently being managed for acute metabolic encephalopathy due to UTI, influenza A as well as sedating medication.   Assessment and Plan:  Acute Metabolic encephalopathy: Likely medication induced as well as in the setting of UTI.  Likely culprits include Flexeril and tramadol.  CT head was unremarkable. Continue to hold Flexeril and tramadol    Acute urinary tract infection: Likely acute cystitis.   Continue current antibiotics Follow-up on culture results   Acute influenza A infection.   Continue Tamiflu Monitor respiratory function closely   COPD: Not in any acute exacerbation at this time.   Continue as needed nebulization   Hypothyroidism, GERD: Continue with home medications.   Psoriasis: Chronic.  Patient is on monoclonal antibody injections.  High risk of immunocompromise   Status post laminectomy on 01/23-clinically stable.   PT OT consulted   Right foot pain: Seen and recently treated by PCP. Xray per patient was reported to be unremarkable for fracture. Patient was treated for suspected gout flare.LLE Duplex was negative for DVT    Advance Care Planning:   Code Status: Full Code      Family Communication: Family at bedside today    Subjective:  Patient seen and examined at bedside this morning Denies nausea vomiting abdominal pain or chest pain Mental status improving however still not at baseline  Physical Exam: General:  Elderly female laying in bed in no acute distress Eyes: PERRL, EOMI, conjunctiva normal bilaterally HENT: Atraumatic normocephalic Neck: No cervical lymphadenopathy Respiratory: Decreased air entry bibasilarly Cardiovascular: Normal rate and rhythm, no murmurs, no gallops, no rubs GI: Nondistended skin: See MSK exam Musculoskeletal: Plaque psoriasis noted to bilateral lower extremity  Vitals:   03/26/23 0751 03/26/23 1006 03/26/23 1030 03/26/23 1400  BP:  127/73 96/82 137/89  Pulse:  90 92 82  Resp:   18 20  Temp: 97.6 F (36.4 C)     TempSrc: Oral     SpO2:   98% 99%  Weight:      Height:        Data Reviewed: I have reviewed patient's chest x-ray showing atelectasis but no significant infiltrate    Latest Ref Rng & Units 03/25/2023   10:52 AM 02/28/2023    6:05 PM 01/10/2023    1:08 AM  CBC  WBC 4.0 - 10.5 K/uL 7.2  10.7  11.0   Hemoglobin 12.0 - 15.0 g/dL 57.8  46.9  62.9   Hematocrit 36.0 - 46.0 % 39.0  37.1  46.1   Platelets 150 - 400 K/uL 286  220  349        Latest Ref Rng & Units 03/25/2023   10:52 AM 03/07/2023    4:30 AM 02/28/2023    6:05 PM  BMP  Glucose 70 - 99 mg/dL 528     BUN 8 - 23 mg/dL 18     Creatinine 4.13 - 1.00 mg/dL 2.44  0.10  2.72   Sodium 135 - 145 mmol/L 141     Potassium  3.5 - 5.1 mmol/L 3.7     Chloride 98 - 111 mmol/L 99     CO2 22 - 32 mmol/L 28     Calcium 8.9 - 10.3 mg/dL 9.4         Disposition: Hopefully home after medical stabilization and PT OT eval  Status is: Inpatient   Time spent: 55 minutes  Author: Loyce Dys, MD 03/26/2023 3:12 PM  For on call review [email or web address].

## 2023-03-26 NOTE — Progress Notes (Signed)
       CROSS COVER NOTE  NAME: [name] MRN: [id number] DOB : 1950/02/25    Date of Service   03/26/2023   HPI/Events of Note   Nurse paged because patient was noted to be tachycardic.  When this NP arrived at bedside it was noted that patient was febrile with an oral temperature of 99.9.  Patient is on oral Cardizem but it would seem that A-fib RVR has been precipitated by fever.  Interventions   - Diltiazem 10 mg administered. - Fever like driving HR; tylenol and bolus of NS administered. - Reevaluate in the AM and obtain cards consult as necessary.     Mykenna Viele Lamin Geradine Girt, MSN, APRN, AGACNP-BC [facility] [place] Pager: [phone]. Check Amion for Availability

## 2023-03-27 DIAGNOSIS — G9341 Metabolic encephalopathy: Secondary | ICD-10-CM | POA: Diagnosis not present

## 2023-03-27 LAB — CBC WITH DIFFERENTIAL/PLATELET
Abs Immature Granulocytes: 0.01 10*3/uL (ref 0.00–0.07)
Basophils Absolute: 0.1 10*3/uL (ref 0.0–0.1)
Basophils Relative: 1 %
Eosinophils Absolute: 0.3 10*3/uL (ref 0.0–0.5)
Eosinophils Relative: 7 %
HCT: 37.2 % (ref 36.0–46.0)
Hemoglobin: 11.2 g/dL — ABNORMAL LOW (ref 12.0–15.0)
Immature Granulocytes: 0 %
Lymphocytes Relative: 47 %
Lymphs Abs: 1.8 10*3/uL (ref 0.7–4.0)
MCH: 29 pg (ref 26.0–34.0)
MCHC: 30.1 g/dL (ref 30.0–36.0)
MCV: 96.4 fL (ref 80.0–100.0)
Monocytes Absolute: 0.5 10*3/uL (ref 0.1–1.0)
Monocytes Relative: 14 %
Neutro Abs: 1.2 10*3/uL — ABNORMAL LOW (ref 1.7–7.7)
Neutrophils Relative %: 31 %
Platelets: 231 10*3/uL (ref 150–400)
RBC: 3.86 MIL/uL — ABNORMAL LOW (ref 3.87–5.11)
RDW: 14.1 % (ref 11.5–15.5)
WBC: 3.8 10*3/uL — ABNORMAL LOW (ref 4.0–10.5)
nRBC: 0 % (ref 0.0–0.2)

## 2023-03-27 LAB — URINE CULTURE

## 2023-03-27 LAB — BASIC METABOLIC PANEL
Anion gap: 8 (ref 5–15)
BUN: 10 mg/dL (ref 8–23)
CO2: 27 mmol/L (ref 22–32)
Calcium: 8.5 mg/dL — ABNORMAL LOW (ref 8.9–10.3)
Chloride: 105 mmol/L (ref 98–111)
Creatinine, Ser: 0.87 mg/dL (ref 0.44–1.00)
GFR, Estimated: >60 mL/min (ref >60)
Glucose, Bld: 120 mg/dL — ABNORMAL HIGH (ref 70–99)
Potassium: 3.2 mmol/L — ABNORMAL LOW (ref 3.5–5.1)
Sodium: 140 mmol/L (ref 135–145)

## 2023-03-27 LAB — PROCALCITONIN: Procalcitonin: <0.1 ng/mL

## 2023-03-27 MED ORDER — POTASSIUM CHLORIDE CRYS ER 20 MEQ PO TBCR
40.0000 meq | EXTENDED_RELEASE_TABLET | Freq: Once | ORAL | Status: AC
  Administered 2023-03-27: 40 meq via ORAL
  Filled 2023-03-27: qty 2

## 2023-03-27 MED ORDER — SODIUM CHLORIDE 0.9 % IV SOLN
1.0000 g | INTRAVENOUS | Status: DC
  Administered 2023-03-27 – 2023-03-28 (×2): 1 g via INTRAVENOUS
  Filled 2023-03-27 (×3): qty 10

## 2023-03-27 NOTE — Plan of Care (Signed)
   Problem: Activity: Goal: Ability to tolerate increased activity will improve Outcome: Progressing

## 2023-03-27 NOTE — NC FL2 (Signed)
[place] MEDICAID FL2 LEVEL OF CARE FORM     IDENTIFICATION  Patient Name: [name] Birthdate: 1951/01/11 Sex: female Admission Date (Current Location): 03/25/2023  [facility] and [place] Number:  Chiropodist and Address:  [facility], [address] [place], [place] [address]      Provider Number: [id number]  Attending Physician Name and Address:  Tresa Moore, MD  Relative Name and Phone Number:  Llewellyn, Schoenberger)  [phone]    Current Level of Care: Hospital Recommended Level of Care: Skilled Nursing Facility Prior Approval Number:    Date Approved/Denied:   PASRR Number: [id number]  Discharge Plan: SNF    Current Diagnoses: Patient Active Problem List   Diagnosis Date Noted   Acute metabolic encephalopathy 03/25/2023   Lumbar radiculopathy 02/28/2023   Spinal stenosis, lumbar region, with neurogenic claudication 02/04/2023   Lumbar disc herniation with myelopathy 02/04/2023   Psoriasis 07/26/2020   Class 1 obesity due to excess calories with serious comorbidity and body mass index (BMI) of 34.0 to 34.9 in adult 07/26/2020   COPD (chronic obstructive pulmonary disease) (HCC) 10/28/2017   Thoracic aortic atherosclerosis (HCC) 02/12/2017   Osteoarthritis 05/07/2016   Insomnia 01/10/2016   Bilateral carotid artery stenosis 08/22/2015   Benign essential HTN 08/04/2015   Mixed hyperlipidemia 08/04/2015   Atherosclerotic peripheral vascular disease with intermittent claudication (HCC) 08/04/2015   Panic disorder without agoraphobia with moderate panic attacks 07/01/2015   DM (diabetes mellitus), type 2 (HCC) 06/09/2015   Anxiety and depression 06/23/2014   Esophageal reflux 07/01/2009   Hypothyroidism 01/11/2009    Orientation RESPIRATION BLADDER Height & Weight     Time, Place, Situation, Self  Normal Continent Weight: 187 lb (84.8 kg) Height:  5\' 3"  (160 cm)  BEHAVIORAL SYMPTOMS/MOOD NEUROLOGICAL BOWEL NUTRITION  STATUS      Continent    AMBULATORY STATUS COMMUNICATION OF NEEDS Skin   Limited Assist Verbally Normal                       Personal Care Assistance Level of Assistance  Bathing, Dressing, Feeding Bathing Assistance: Limited assistance Feeding assistance: Limited assistance Dressing Assistance: Limited assistance     Functional Limitations Info  Sight, Hearing, Speech Sight Info: Adequate Hearing Info: Adequate Speech Info: Adequate    SPECIAL CARE FACTORS FREQUENCY  PT (By licensed PT), OT (By licensed OT)     PT Frequency: 5 times a week OT Frequency: 5 times a week            Contractures Contractures Info: Not present    Additional Factors Info  Isolation Precautions Code Status Info: FULL Allergies Info: Ciprofloxacin, Gabapentin, Trazodone And Nefazodone, Trazodone, Mucinex (Guaifenesin Er), Oxycodone, Oxycodone-acetaminophen, Oxycodone-acetaminophen, Sulfa Antibiotics     Isolation Precautions Info: Droplet precautions     Current Medications (03/27/2023):  This is the current hospital active medication list Current Facility-Administered Medications  Medication Dose Route Frequency Provider Last Rate Last Admin   acetaminophen (TYLENOL) tablet 500 mg  500 mg Oral Q6H PRN Jawo, Modou L, NP   500 mg at 03/26/23 8469   albuterol (PROVENTIL) (2.5 MG/3ML) 0.083% nebulizer solution 2.5 mg  2.5 mg Nebulization Q2H PRN Acheampong, Genice Rouge, MD       aspirin EC tablet 81 mg  81 mg Oral Daily Acheampong, Genice Rouge, MD   81 mg at 03/27/23 6295   benzonatate (TESSALON) capsule 200 mg  200 mg Oral TID PRN Lilia Pro,  MD       carvedilol (COREG) tablet 3.125 mg  3.125 mg Oral BID WC Acheampong, Genice Rouge, MD   3.125 mg at 03/27/23 0827   cefTRIAXone (ROCEPHIN) 1 g in sodium chloride 0.9 % 100 mL IVPB  1 g Intravenous Q24H Lilia Pro, MD   Stopped at 03/26/23 2205   citalopram (CELEXA) tablet 20 mg  20 mg Oral Daily Otelia Sergeant, RPH   20 mg at 03/27/23  0827   diltiazem (CARDIZEM CD) 24 hr capsule 120 mg  120 mg Oral Daily Lilia Pro, MD   120 mg at 03/27/23 0828   doxepin (SINEQUAN) capsule 50 mg  50 mg Oral QHS Lilia Pro, MD   50 mg at 03/26/23 2238   enoxaparin (LOVENOX) injection 40 mg  40 mg Subcutaneous Q24H Lilia Pro, MD   40 mg at 03/26/23 2127   fenofibrate tablet 160 mg  160 mg Oral Daily Otelia Sergeant, RPH   160 mg at 03/27/23 0827   fluticasone furoate-vilanterol (BREO ELLIPTA) 100-25 MCG/ACT 1 puff  1 puff Inhalation Daily Acheampong, Genice Rouge, MD   1 puff at 03/27/23 0829   levothyroxine (SYNTHROID) tablet 100 mcg  100 mcg Oral Q0600 Lilia Pro, MD   100 mcg at 03/27/23 0636   ondansetron (ZOFRAN) tablet 4 mg  4 mg Oral Q6H PRN Lilia Pro, MD       Or   ondansetron [facility]) injection 4 mg  4 mg Intravenous Q6H PRN Lilia Pro, MD   4 mg at 03/26/23 5621   oseltamivir (TAMIFLU) capsule 30 mg  30 mg Oral BID Tressie Ellis, RPH   30 mg at 03/27/23 3086   pantoprazole (PROTONIX) EC tablet 40 mg  40 mg Oral QAC supper Lilia Pro, MD   40 mg at 03/26/23 1753   rosuvastatin (CRESTOR) tablet 20 mg  20 mg Oral Daily Acheampong, Genice Rouge, MD   20 mg at 03/27/23 0827   senna (SENOKOT) tablet 8.6 mg  1 tablet Oral BID PRN Acheampong, Genice Rouge, MD       sodium chloride flush (NS) 0.9 % injection 3 mL  3 mL Intravenous Q12H Lilia Pro, MD   3 mL at 03/27/23 0829   [START ON 03/28/2023] Vitamin D (Ergocalciferol) (DRISDOL) 1.25 MG (50000 UNIT) capsule 50,000 Units  50,000 Units Oral Once per day on Monday Thursday Acheampong, Genice Rouge, MD         Discharge Medications: Please see discharge summary for a list of discharge medications.  Relevant Imaging Results:  Relevant Lab Results:   Additional Information [id number]  Allena Katz, LCSW

## 2023-03-27 NOTE — Progress Notes (Signed)
PROGRESS NOTE    [name]  QVZ:[id number] DOB: 05-Aug-1950 DOA: 03/25/2023 PCP: Lorre Munroe, NP    Brief Narrative:    [name] is a 72 y.o. female with medical history significant of hypertension, hypothyroidism, COPD, GERD, arthritis, psoriasis on monoclonal antibody injections, anxiety disorder, peripheral vascular disease, status post recent laminectomy on 01/23.  Patient was brought in by family on account of frequent falls and generalized weakness with altered mentation.  Patient currently being managed for acute metabolic encephalopathy due to UTI, influenza A as well as sedating medication.    Assessment & Plan:   Principal Problem:   Acute metabolic encephalopathy  Acute Metabolic encephalopathy: Likely medication induced as well as in the setting of UTI.  Likely culprits include Flexeril and tramadol.  CT head was unremarkable. Continue to hold Flexeril and tramadol  Mental status improving   Acute urinary tract infection: Likely acute cystitis.  Urinalysis with contamination Plan: Complete 3-day course of Rocephin  Acute influenza A infection.   Continue Tamiflu Monitor respiratory function closely   COPD: Not in any acute exacerbation at this time.   Continue as needed nebulization   Hypothyroidism, GERD: Continue with home medications.   Psoriasis: Chronic.  Patient is on monoclonal antibody injections.  High risk of immunocompromise   Status post laminectomy on 01/23-clinically stable.   PT OT consulted Will need skilled nursing facility   Right foot pain: Seen and recently treated by PCP. Xray per patient was reported to be unremarkable for fracture. Patient was treated for suspected gout flare.LLE Duplex was negative for DVT   DVT prophylaxis: SQ lovenox Code Status: FULL Family Communication:None today Disposition Plan: Status is: Inpatient Remains inpatient appropriate because: Acute metabolic encephalopathy in the setting of polypharmacy  versus UTI.  Frequent falls.  Will need skilled nursing facility   Level of care: Med-Surg  Consultants:  None  Procedures:  None  Antimicrobials: Rocephin    Subjective: Seen and examined.  Resting in bed.  Sleepy otherwise stable.  Alert and oriented x 3.  Objective: Vitals:   03/26/23 1536 03/26/23 1753 03/26/23 2232 03/27/23 0830  BP:  (!) 148/64 131/67 (!) 137/59  Pulse:  79 80 75  Resp:  17 20 16   Temp: 98.8 F (37.1 C)  98.3 F (36.8 C) 97.7 F (36.5 C)  TempSrc: Oral     SpO2:  94% 96% 97%  Weight:      Height:        Intake/Output Summary (Last 24 hours) at 03/27/2023 1600 Last data filed at 03/27/2023 1500 Gross per 24 hour  Intake 1465.73 ml  Output 725 ml  Net 740.73 ml   Filed Weights   03/25/23 1046  Weight: 84.8 kg    Examination:  General exam: Appears calm and comfortable  Respiratory system: Clear to auscultation. Respiratory effort normal. Cardiovascular system: S1-S2, RRR, no murmurs, no pedal edema Gastrointestinal system: Soft, NT/ND, normal bowel sounds Central nervous system: Alert.  Oriented x 3.  No focal deficits Extremities: Symmetric 5 x 5 power. Skin: No rashes, lesions or ulcers Psychiatry: Judgement and insight appear normal. Mood & affect flattened.     Data Reviewed: I have personally reviewed following labs and imaging studies  CBC: Recent Labs  Lab 03/25/23 1052 03/26/23 1537 03/27/23 0903  WBC 7.2 3.8* 3.8*  NEUTROABS  --   --  1.2*  HGB 12.0 10.9* 11.2*  HCT 39.0 35.5* 37.2  MCV 94.9 95.7 96.4  PLT 286 212  231   Basic Metabolic Panel: Recent Labs  Lab 03/25/23 1052 03/26/23 1537 03/27/23 0903  NA 141 142 140  K 3.7 3.3* 3.2*  CL 99 104 105  CO2 28 27 27   GLUCOSE 156* 131* 120*  BUN 18 12 10   CREATININE 0.96 0.94 0.87  CALCIUM 9.4 8.4* 8.5*   GFR: Estimated Creatinine Clearance: 58.6 mL/min (by C-G formula based on SCr of 0.87 mg/dL). Liver Function Tests: Recent Labs  Lab 03/25/23 1052   AST 21  ALT 16  ALKPHOS 86  BILITOT 1.0  PROT 7.0  ALBUMIN 3.7   Recent Labs  Lab 03/25/23 1633  LIPASE 24   Recent Labs  Lab 03/25/23 1633  AMMONIA <10   Coagulation Profile: No results for input(s): "INR", "PROTIME" in the last 168 hours. Cardiac Enzymes: No results for input(s): "CKTOTAL", "CKMB", "CKMBINDEX", "TROPONINI" in the last 168 hours. BNP (last 3 results) No results for input(s): "PROBNP" in the last 8760 hours. HbA1C: No results for input(s): "HGBA1C" in the last 72 hours. CBG: No results for input(s): "GLUCAP" in the last 168 hours. Lipid Profile: No results for input(s): "CHOL", "HDL", "LDLCALC", "TRIG", "CHOLHDL", "LDLDIRECT" in the last 72 hours. Thyroid Function Tests: No results for input(s): "TSH", "T4TOTAL", "FREET4", "T3FREE", "THYROIDAB" in the last 72 hours. Anemia Panel: No results for input(s): "VITAMINB12", "FOLATE", "FERRITIN", "TIBC", "IRON", "RETICCTPCT" in the last 72 hours. Sepsis Labs: Recent Labs  Lab 03/27/23 0903  PROCALCITON <0.10    Recent Results (from the past 240 hours)  Resp panel by RT-PCR (RSV, Flu A&B, Covid) Anterior Nasal Swab     Status: Abnormal   Collection Time: 03/25/23  4:34 PM   Specimen: Anterior Nasal Swab  Result Value Ref Range Status   SARS Coronavirus 2 by RT PCR NEGATIVE NEGATIVE Final    Comment: (NOTE) SARS-CoV-2 target nucleic acids are NOT DETECTED.  The SARS-CoV-2 RNA is generally detectable in upper respiratory specimens during the acute phase of infection. The lowest concentration of SARS-CoV-2 viral copies this assay can detect is 138 copies/mL. A negative result does not preclude SARS-Cov-2 infection and should not be used as the sole basis for treatment or other patient management decisions. A negative result may occur with  improper specimen collection/handling, submission of specimen other than nasopharyngeal swab, presence of viral mutation(s) within the areas targeted by this assay,  and inadequate number of viral copies(<138 copies/mL). A negative result must be combined with clinical observations, patient history, and epidemiological information. The expected result is Negative.  Fact Sheet for Patients:  BloggerCourse.com  Fact Sheet for Healthcare Providers:  SeriousBroker.it  This test is no t yet approved or cleared by the [place] FDA and  has been authorized for detection and/or diagnosis of SARS-CoV-2 by FDA under an Emergency Use Authorization (EUA). This EUA will remain  in effect (meaning this test can be used) for the duration of the COVID-19 declaration under Section 564(b)(1) of the Act, 21 U.S.C.section 360bbb-3(b)(1), unless the authorization is terminated  or revoked sooner.       Influenza A by PCR POSITIVE (A) NEGATIVE Final   Influenza B by PCR NEGATIVE NEGATIVE Final    Comment: (NOTE) The Xpert Xpress SARS-CoV-2/FLU/RSV plus assay is intended as an aid in the diagnosis of influenza from Nasopharyngeal swab specimens and should not be used as a sole basis for treatment. Nasal washings and aspirates are unacceptable for Xpert Xpress SARS-CoV-2/FLU/RSV testing.  Fact Sheet for Patients: BloggerCourse.com  Fact Sheet for Healthcare  Providers: SeriousBroker.it  This test is not yet approved or cleared by the [place] and has been authorized for detection and/or diagnosis of SARS-CoV-2 by FDA under an Emergency Use Authorization (EUA). This EUA will remain in effect (meaning this test can be used) for the duration of the COVID-19 declaration under Section 564(b)(1) of the Act, 21 U.S.C. section 360bbb-3(b)(1), unless the authorization is terminated or revoked.     Resp Syncytial Virus by PCR NEGATIVE NEGATIVE Final    Comment: (NOTE) Fact Sheet for Patients: BloggerCourse.com  Fact Sheet for  Healthcare Providers: SeriousBroker.it  This test is not yet approved or cleared by the [place] FDA and has been authorized for detection and/or diagnosis of SARS-CoV-2 by FDA under an Emergency Use Authorization (EUA). This EUA will remain in effect (meaning this test can be used) for the duration of the COVID-19 declaration under Section 564(b)(1) of the Act, 21 U.S.C. section 360bbb-3(b)(1), unless the authorization is terminated or revoked.  Performed at [facility], [address], [place], [place] [address]   Urine Culture (for pregnant, neutropenic or urologic patients or patients with an indwelling urinary catheter)     Status: Abnormal   Collection Time: 03/25/23  4:34 PM   Specimen: Urine, Random  Result Value Ref Range Status   Specimen Description   Final    URINE, RANDOM Performed at [facility], [address] [place], [place] [address]    Special Requests   Final    NONE Performed at [facility], [address] [place], [place] [address]    Culture MULTIPLE SPECIES PRESENT, SUGGEST RECOLLECTION (A)  Final   Report Status 03/27/2023 FINAL  Final  Culture, blood (Routine X 2) w Reflex to ID Panel     Status: None (Preliminary result)   Collection Time: 03/26/23  9:13 AM   Specimen: BLOOD  Result Value Ref Range Status   Specimen Description BLOOD BLOOD LEFT HAND  Final   Special Requests   Final    BOTTLES DRAWN AEROBIC ONLY Blood Culture results may not be optimal due to an inadequate volume of blood received in culture bottles   Culture   Final    NO GROWTH < 24 HOURS Performed at [facility], [address] [place], [place] [address]    Report Status PENDING  Incomplete  Culture, blood (Routine X 2) w Reflex to ID Panel     Status: None (Preliminary result)   Collection Time: 03/26/23  9:18 AM   Specimen: BLOOD  Result Value Ref Range Status   Specimen Description BLOOD BLOOD  RIGHT HAND  Final   Special Requests   Final    BOTTLES DRAWN AEROBIC AND ANAEROBIC Blood Culture adequate volume   Culture   Final    NO GROWTH < 24 HOURS Performed at [facility], [address], [place], [place] [address]    Report Status PENDING  Incomplete         Radiology Studies: DG Chest 1 View Result Date: 03/25/2023 CLINICAL DATA:  Shortness of breath EXAM: CHEST  1 VIEW COMPARISON:  01/10/2023, 01/02/2023 FINDINGS: Low lung volumes. Streaky bibasilar atelectasis. Stable cardiomediastinal silhouette with aortic atherosclerosis. No pneumothorax IMPRESSION: Low lung volumes with streaky bibasilar atelectasis. Electronically Signed   By: Jasmine Pang M.D.   On: 03/25/2023 21:54   CT ABDOMEN PELVIS W CONTRAST Result Date: 03/25/2023 CLINICAL DATA:  Left lower quadrant abdominal pain EXAM: CT ABDOMEN AND PELVIS WITH CONTRAST TECHNIQUE:  Multidetector CT imaging of the abdomen and pelvis was performed using the standard protocol following bolus administration of intravenous contrast. RADIATION DOSE REDUCTION: This exam was performed according to the departmental dose-optimization program which includes automated exposure control, adjustment of the mA and/or kV according to patient size and/or use of iterative reconstruction technique. CONTRAST:  OMNIPAQUE IOHEXOL 300 MG/ML  SOLN COMPARISON:  01/10/2023 FINDINGS: Lower chest: No acute abnormality. Previously noted left pleural effusion has resolved. Extensive coronary artery calcification. Hepatobiliary: Mild hepatic steatosis. No enhancing intrahepatic mass. No intra or extrahepatic biliary ductal dilation. Gallbladder unremarkable. Pancreas: Unremarkable Spleen: Unremarkable Adrenals/Urinary Tract: 2 cm left adrenal nodule is stable since prior examination and measures 52 Hounsfield units on portal venous phase images and 29 Hounsfield units 4 minute delayed images and is technically indeterminate. This most likely  represents a benign adrenal adenoma and could be confirmed with dedicated adrenal mass protocol CT or MR imaging in year. The right adrenal gland is unremarkable. The kidneys are normal in size and position. 2 mm cortical calcification within the lower pole the right kidney is nonspecific, likely the result remote trauma or inflammation. The kidneys are otherwise unremarkable. The bladder is unremarkable. Stomach/Bowel: Mild sigmoid diverticulosis. Stomach, small bowel, and large bowel are otherwise unremarkable. Appendix normal. No evidence of obstruction or focal inflammation. No free intraperitoneal gas or fluid. Vascular/Lymphatic: Aortic atherosclerosis. No enlarged abdominal or pelvic lymph nodes. Reproductive: Status post hysterectomy. Stable 5.1 cm minimally complex cyst within the left ovary demonstrating a single thin noncalcified internal septation on MRI examination of 12/19/2022. No follow-up imaging is recommended for this lesion. Other: Moderate fat containing umbilical hernia. Musculoskeletal: Bilateral laminectomy and posterior decompression of L4 and L5 has been performed. Degenerative changes are seen throughout the visualized thoracolumbar spine. No acute bone abnormality. No lytic or blastic bone lesion. IMPRESSION: 1. No acute intra-abdominal pathology identified. No definite radiographic explanation for the patient's reported symptoms. 2. Extensive coronary artery calcification. 3. Mild hepatic steatosis. 4. Mild sigmoid diverticulosis without superimposed acute inflammatory change. 5. Stable 2 cm left adrenal nodule, technically indeterminate, but most likely representing a benign adrenal adenoma. This could be confirmed with dedicated adrenal mass protocol CT or MR imaging in year. 6. Moderate fat containing umbilical hernia. Aortic Atherosclerosis (ICD10-I70.0). Electronically Signed   By: Helyn Numbers M.D.   On: 03/25/2023 19:43        Scheduled Meds:  aspirin EC  81 mg Oral  Daily   carvedilol  3.125 mg Oral BID WC   citalopram  20 mg Oral Daily   diltiazem  120 mg Oral Daily   doxepin  50 mg Oral QHS   enoxaparin (LOVENOX) injection  40 mg Subcutaneous Q24H   fenofibrate  160 mg Oral Daily   fluticasone furoate-vilanterol  1 puff Inhalation Daily   levothyroxine  100 mcg Oral Q0600   oseltamivir  30 mg Oral BID   pantoprazole  40 mg Oral QAC supper   rosuvastatin  20 mg Oral Daily   sodium chloride flush  3 mL Intravenous Q12H   [START ON 03/28/2023] Vitamin D (Ergocalciferol)  50,000 Units Oral Once per day on Monday Thursday   Continuous Infusions:  cefTRIAXone (ROCEPHIN)  IV Stopped (03/26/23 2205)     LOS: 1 day     Tresa Moore, MD [facility]   If 7PM-7AM, please contact night-coverage  03/27/2023, 4:00 PM

## 2023-03-27 NOTE — Plan of Care (Signed)

## 2023-03-27 NOTE — Evaluation (Signed)
Physical Therapy Evaluation Patient Details Name: [name] MRN: [id number] DOB: 04/27/1950 Today's Date: 03/27/2023  History of Present Illness  [name] is a 73 y.o. female with medical history significant of hypertension, hypothyroidism, COPD, GERD, arthritis, psoriasis on monoclonal antibody injections, anxiety disorder, peripheral vascular disease, status post recent laminectomy on 02/28/23.  Patient was brought in by family on account of frequent falls and generalized weakness with altered mentation.  Patient currently being managed for acute metabolic encephalopathy due to UTI, influenza A as well as sedating medication.   Clinical Impression  Patient alert, oriented to self, place, year. Pt stated she was at home, per son via the OT she has had several falls at home since surgery, she reported using her rollator. She was able to perform sit <> stand with CGA and RW with cues for hand placement. She did have 3 LOB when attempting to don briefs, minA to maintain balance and safety. She ambulated ~25ft with RW and CGA.  Overall the patient demonstrated deficits (see "PT Problem List") that impede the patient's functional abilities, safety, and mobility and would benefit from skilled PT intervention.          If plan is discharge home, recommend the following: Assistance with cooking/housework;Direct supervision/assist for medications management;Direct supervision/assist for financial management;Assist for transportation;Help with stairs or ramp for entrance;Supervision due to cognitive status;A lot of help with bathing/dressing/bathroom;A little help with walking and/or transfers   Can travel by private vehicle   Yes    Equipment Recommendations None recommended by PT  Recommendations for Other Services       Functional Status Assessment Patient has had a recent decline in their functional status and demonstrates the ability to make significant improvements in function in a  reasonable and predictable amount of time.     Precautions / Restrictions Precautions Precautions: Fall Restrictions Weight Bearing Restrictions Per Provider Order: No      Mobility  Bed Mobility                    Transfers   Equipment used: Rolling walker (2 wheels) Transfers: Sit to/from Stand Sit to Stand: Contact guard assist                Ambulation/Gait Ambulation/Gait assistance: Contact guard assist Gait Distance (Feet): 30 Feet Assistive device: Rolling walker (2 wheels)            Stairs            Wheelchair Mobility     Tilt Bed    Modified Rankin (Stroke Patients Only)       Balance Overall balance assessment: Needs assistance Sitting-balance support: Feet supported Sitting balance-Leahy Scale: Fair Sitting balance - Comments: able to do some pericare in sitting, but unable to reach her feet to don briefs     Standing balance-Leahy Scale: Poor Standing balance comment: static standing fair, but any dynamic movement the pt required minA for steadying (during donning underwear pt with 3 LOB)                             Pertinent Vitals/Pain Pain Assessment Pain Assessment: No/denies pain    Home Living Family/patient expects to be discharged to:: Private residence Living Arrangements: Alone Available Help at Discharge: Family;Available PRN/intermittently Type of Home: Apartment Home Access: Level entry       Home Layout: One level Home Equipment: BSC/3in1;Rollator (4 wheels);Cane - single point  Additional Comments: has son who lives close by, but he works night shift    Prior Function Prior Level of Function : Needs assist             Mobility Comments: rollator for household distances ADLs Comments: Mod indep with ADL, slip on shoes, light meal prep, only wearing 2L at night. Son assists with transporation t/f appts and grocery stores     Extremity/Trunk Assessment   Upper Extremity  Assessment Upper Extremity Assessment: Generalized weakness    Lower Extremity Assessment Lower Extremity Assessment: Generalized weakness       Communication        Cognition Arousal: Alert Behavior During Therapy: WFL for tasks assessed/performed                           PT - Cognition Comments: pt oriented to self, year, location Following commands: Intact       Cueing Cueing Techniques: Verbal cues, Visual cues     General Comments      Exercises     Assessment/Plan    PT Assessment Patient needs continued PT services  PT Problem List Decreased strength;Decreased balance;Decreased mobility;Pain;Obesity;Decreased knowledge of use of DME;Decreased knowledge of precautions;Decreased activity tolerance       PT Treatment Interventions DME instruction;Gait training;Therapeutic activities;Therapeutic exercise    PT Goals (Current goals can be found in the Care Plan section)  Acute Rehab PT Goals Patient Stated Goal: to move better PT Goal Formulation: With patient Time For Goal Achievement: 04/10/23 Potential to Achieve Goals: Good    Frequency Min 1X/week     Co-evaluation               AM-PAC PT "6 Clicks" Mobility  Outcome Measure Help needed turning from your back to your side while in a flat bed without using bedrails?: A Little Help needed moving from lying on your back to sitting on the side of a flat bed without using bedrails?: A Little Help needed moving to and from a bed to a chair (including a wheelchair)?: A Little Help needed standing up from a chair using your arms (e.g., wheelchair or bedside chair)?: A Little Help needed to walk in hospital room?: A Little Help needed climbing 3-5 steps with a railing? : A Little 6 Click Score: 18    End of Session Equipment Utilized During Treatment: Gait belt Activity Tolerance: Patient tolerated treatment well Patient left: in chair;with call bell/phone within reach;with chair alarm  set Nurse Communication: Mobility status PT Visit Diagnosis: Repeated falls (R29.6);Muscle weakness (generalized) (M62.81);Difficulty in walking, not elsewhere classified (R26.2)    Time: 1610-9604 PT Time Calculation (min) (ACUTE ONLY): 16 min   Charges:   PT Evaluation $PT Eval Low Complexity: 1 Low PT Treatments $Therapeutic Activity: 8-22 mins PT General Charges $$ ACUTE PT VISIT: 1 Visit        Olga Coaster PT, DPT 11:40 AM,03/27/23

## 2023-03-27 NOTE — Evaluation (Signed)
Occupational Therapy Evaluation Patient Details Name: [name] MRN: [id number] DOB: December 28, 1950 Today's Date: 03/27/2023   History of Present Illness   Pt is a 73 year old female brought in by family on account of frequent falls and generalized weakness with altered mentation.  Patient currently being managed for acute metabolic encephalopathy due to UTI, influenza A as well as sedating medication.        PMH significant for hypertension, hypothyroidism, COPD, GERD, arthritis, psoriasis on monoclonal antibody injections, anxiety disorder, peripheral vascular disease, status post recent laminectomy on 01/23     Clinical Impressions Chart reviewed, pt greeted on bsc, agreeable to OT evaluation. Pt is alert and oriented x4, mild safety deficits noted with slightly increased time for processing. Pt reports she still feels slightly "off". PTA pt reports she has been performing ADL with MOD I, assist for IADL from son who lives close. Pt son on phone endorses pt has fallen 3x since surgery and they are concerned about future falls. Pt presents with deficits in strength, endurance, activity tolerance, balance, cognition affecting safe and optimal ADL completion. MIN A required for STS, MIN A for toileting, MAX A for LB dressing, amb in room with RW approx 20' with MIN A.Fair carry over of back precautions from surgery. Pt will benefit from OT to facilitate optimal Adl performance and return to PLOF. OT will follow acutely.      If plan is discharge home, recommend the following:   A little help with walking and/or transfers;Assist for transportation;Assistance with cooking/housework;A little help with bathing/dressing/bathroom     Functional Status Assessment   Patient has had a recent decline in their functional status and demonstrates the ability to make significant improvements in function in a reasonable and predictable amount of time.     Equipment Recommendations   Other (comment)  (defer)     Recommendations for Other Services         Precautions/Restrictions   Precautions Precautions: Fall;Back Recall of Precautions/Restrictions: Impaired Precaution/Restrictions Comments: status post recent laminectomy on 01/23     Mobility Bed Mobility               General bed mobility comments: NT on bsc prior to OT session, in chair post session    Transfers Overall transfer level: Needs assistance Equipment used: Rolling walker (2 wheels) Transfers: Sit to/from Stand Sit to Stand: Min assist                  Balance Overall balance assessment: Needs assistance Sitting-balance support: Feet supported Sitting balance-Leahy Scale: Fair     Standing balance support: Bilateral upper extremity supported, During functional activity, Reliant on assistive device for balance Standing balance-Leahy Scale: Poor                             ADL either performed or assessed with clinical judgement   ADL Overall ADL's : Needs assistance/impaired     Grooming: Wash/dry face;Sitting           Upper Body Dressing : Minimal assistance;Sitting   Lower Body Dressing: Maximal assistance;Sitting/lateral leans Lower Body Dressing Details (indicate cue type and reason): underwear, socks; trialed figure 4, pt reports she has been performing at home Toilet Transfer: Minimal assistance;Rolling walker (2 wheels);BSC/3in1 Toilet Transfer Details (indicate cue type and reason): intermittent vcs for technique Toileting- Clothing Manipulation and Hygiene: Minimal assistance;Sitting/lateral lean       Functional mobility during ADLs:  Minimal assistance;Rollator (4 wheels) (approx 20' in room with RW, frequent vcs for technique)       Vision Patient Visual Report: No change from baseline       Perception         Praxis         Pertinent Vitals/Pain Pain Assessment Pain Assessment: No/denies pain     Extremity/Trunk Assessment Upper  Extremity Assessment Upper Extremity Assessment: Generalized weakness   Lower Extremity Assessment Lower Extremity Assessment: Generalized weakness       Communication Communication Communication: No apparent difficulties   Cognition Arousal: Alert Behavior During Therapy: WFL for tasks assessed/performed Cognition: Cognition impaired         Attention impairment (select first level of impairment): Alternating attention Executive functioning impairment (select all impairments): Problem solving OT - Cognition Comments: pt reports she still feels a little "off"                 Following commands: Intact       Cueing  General Comments   Cueing Techniques: Verbal cues;Visual cues      Exercises Other Exercises Other Exercises: edu re: role of OT, role of rehab, discharge recommendations (discussed on phone with son as well)   Shoulder Instructions      Home Living Family/patient expects to be discharged to:: Private residence Living Arrangements: Alone Available Help at Discharge: Family;Available PRN/intermittently Type of Home: Apartment Home Access: Level entry     Home Layout: One level     Bathroom Shower/Tub: Chief Strategy Officer: Handicapped height Bathroom Accessibility: Yes How Accessible: Accessible via walker Home Equipment: BSC/3in1;Rollator (4 wheels);Cane - single point   Additional Comments: has son who lives close by, but he works night shift      Prior Functioning/Environment Prior Level of Function : Needs assist;History of Falls (last six months)             Mobility Comments: rollator for household distances; pt son reports she has fallen 3x since surgery ADLs Comments: pt reports MOD I for ADL, increased time; assist for IADLs as needed from son    OT Problem List: Pain;Obesity;Decreased strength;Decreased range of motion;Decreased activity tolerance;Impaired balance (sitting and/or standing);Decreased  knowledge of precautions;Decreased knowledge of use of DME or AE   OT Treatment/Interventions: Self-care/ADL training;Neuromuscular education;Energy conservation;DME and/or AE instruction;Therapeutic activities;Patient/family education;Balance training;Therapeutic exercise      OT Goals(Current goals can be found in the care plan section)   Acute Rehab OT Goals Patient Stated Goal: go home OT Goal Formulation: With patient/family Time For Goal Achievement: 04/10/23 Potential to Achieve Goals: Fair ADL Goals Pt Will Perform Grooming: with modified independence;sitting Pt Will Perform Lower Body Dressing: with contact guard assist;sitting/lateral leans;sit to/from stand Pt Will Transfer to Toilet: with supervision;ambulating Pt Will Perform Toileting - Clothing Manipulation and hygiene: sitting/lateral leans;with modified independence   OT Frequency:  Min 1X/week    Co-evaluation              AM-PAC OT "6 Clicks" Daily Activity     Outcome Measure Help from another person eating meals?: None Help from another person taking care of personal grooming?: None Help from another person toileting, which includes using toliet, bedpan, or urinal?: A Lot Help from another person bathing (including washing, rinsing, drying)?: A Lot Help from another person to put on and taking off regular upper body clothing?: A Little Help from another person to put on and taking off regular lower body clothing?: A  Lot 6 Click Score: 17   End of Session Equipment Utilized During Treatment: Rolling walker (2 wheels);Gait belt Nurse Communication: Mobility status  Activity Tolerance: Patient tolerated treatment well Patient left: with call bell/phone within reach;in chair;with chair alarm set  OT Visit Diagnosis: Unsteadiness on feet (R26.81);History of falling (Z91.81);Other abnormalities of gait and mobility (R26.89);Repeated falls (R29.6);Pain                Time: 4098-1191 OT Time Calculation  (min): 21 min Charges:  OT General Charges $OT Visit: 1 Visit OT Evaluation $OT Eval Moderate Complexity: 1 Mod  Oleta Mouse, OTD OTR/L  03/27/23, 12:16 PM

## 2023-03-28 DIAGNOSIS — G9341 Metabolic encephalopathy: Secondary | ICD-10-CM | POA: Diagnosis not present

## 2023-03-28 MED ORDER — CLONAZEPAM 0.5 MG PO TABS
0.5000 mg | ORAL_TABLET | Freq: Three times a day (TID) | ORAL | Status: DC
Start: 2023-03-28 — End: 2023-03-29
  Administered 2023-03-28 – 2023-03-29 (×3): 0.5 mg via ORAL
  Filled 2023-03-28 (×3): qty 1

## 2023-03-28 NOTE — TOC Progression Note (Signed)
Transition of Care [facility]) - Progression Note    Patient Details  Name: [name] MRN: [id number] Date of Birth: [birth year]  [facility] of [facility]) CM/SW Contact  Chapman Fitch, RN Phone Number: 03/28/2023, 12:44 PM  Clinical Narrative:     This TOC assumed care of patient on 2/20.  Per MD patient requests SNF at [facility] Confirmed with patient, and she would like to accept bed at [facility].  Accepted in [place], and notified Theodoro Grist at [facility]  VM left for HTA requesting return call to start authorization.  Patient states that son will be available to transport at discharge       Expected Discharge Plan and Services                                               Social Determinants of Health (SDOH) Interventions SDOH Screenings   Food Insecurity: No Food Insecurity (03/26/2023)  Housing: Low Risk  (03/26/2023)  Transportation Needs: No Transportation Needs (03/26/2023)  Utilities: Not At Risk (03/26/2023)  Alcohol Screen: Low Risk  (08/28/2022)  Depression (PHQ2-9): Low Risk  (02/08/2023)  Financial Resource Strain: Low Risk  (04/27/2022)  Physical Activity: Inactive (04/27/2022)  Social Connections: Moderately Isolated (03/26/2023)  Stress: No Stress Concern Present (04/27/2022)  Tobacco Use: Medium Risk (03/26/2023)    Readmission Risk Interventions    01/05/2023   12:33 PM  Readmission Risk Prevention Plan  Transportation Screening Complete  PCP or Specialist Appt within 3-5 Days Complete  HRI or Home Care Consult Complete  Social Work Consult for Recovery Care Planning/Counseling Complete  Palliative Care Screening Not Applicable  Medication Review Oceanographer) Complete

## 2023-03-28 NOTE — TOC Progression Note (Signed)
Transition of Care [facility]) - Progression Note    Patient Details  Name: [name] MRN: [id number] Date of Birth: [birth year]  [facility] of [facility]) CM/SW Contact  Chapman Fitch, RN Phone Number: 03/28/2023, 2:13 PM  Clinical Narrative:          Berkley Harvey started through HTA spoke with Tammy  Expected Discharge Plan and Services                                               Social Determinants of Health (SDOH) Interventions SDOH Screenings   Food Insecurity: No Food Insecurity (03/26/2023)  Housing: Low Risk  (03/26/2023)  Transportation Needs: No Transportation Needs (03/26/2023)  Utilities: Not At Risk (03/26/2023)  Alcohol Screen: Low Risk  (08/28/2022)  Depression (PHQ2-9): Low Risk  (02/08/2023)  Financial Resource Strain: Low Risk  (04/27/2022)  Physical Activity: Inactive (04/27/2022)  Social Connections: Moderately Isolated (03/26/2023)  Stress: No Stress Concern Present (04/27/2022)  Tobacco Use: Medium Risk (03/26/2023)    Readmission Risk Interventions    01/05/2023   12:33 PM  Readmission Risk Prevention Plan  Transportation Screening Complete  PCP or Specialist Appt within 3-5 Days Complete  HRI or Home Care Consult Complete  Social Work Consult for Recovery Care Planning/Counseling Complete  Palliative Care Screening Not Applicable  Medication Review Oceanographer) Complete

## 2023-03-28 NOTE — Plan of Care (Signed)

## 2023-03-28 NOTE — Progress Notes (Signed)
PROGRESS NOTE    [name]  TKZ:[id number] DOB: 28-Dec-1950 DOA: 03/25/2023 PCP: Lorre Munroe, NP    Brief Narrative:    [name] is a 73 y.o. female with medical history significant of hypertension, hypothyroidism, COPD, GERD, arthritis, psoriasis on monoclonal antibody injections, anxiety disorder, peripheral vascular disease, status post recent laminectomy on 01/23.  Patient was brought in by family on account of frequent falls and generalized weakness with altered mentation.  Patient currently being managed for acute metabolic encephalopathy due to UTI, influenza A as well as sedating medication.    Assessment & Plan:   Principal Problem:   Acute metabolic encephalopathy  Acute Metabolic encephalopathy: Likely medication induced as well as in the setting of UTI.  Likely culprits include Flexeril and tramadol.  CT head was unremarkable. Continue to hold Flexeril and tramadol  Mental status improving Can cautiously restart clonazepam   Acute urinary tract infection: Likely acute cystitis.  Urinalysis with contamination Plan: Complete 3-day course of Rocephin (now complete)  Acute influenza A infection.   Continue Tamiflu Monitor respiratory function closely   COPD: Not in any acute exacerbation at this time.   Continue as needed nebulization   Hypothyroidism, GERD: Continue with home medications.   Psoriasis: Chronic.  Patient is on monoclonal antibody injections.  High risk of immunocompromise   Status post laminectomy on 01/23-clinically stable.   PT OT consulted Will need skilled nursing facility Plan [facility]   Right foot pain: Seen and recently treated by PCP. Xray per patient was reported to be unremarkable for fracture. Patient was treated for suspected gout flare.LLE Duplex was negative for DVT   DVT prophylaxis: SQ lovenox Code Status: FULL Family Communication:None today Disposition Plan: Status is: Inpatient Remains inpatient  appropriate because: Acute metabolic encephalopathy in the setting of polypharmacy versus UTI.  Frequent falls.  Will need skilled nursing facility   Level of care: Med-Surg  Consultants:  None  Procedures:  None  Antimicrobials: Rocephin    Subjective: Seen and examined.  Mental status improved today.  Objective: Vitals:   03/27/23 1610 03/27/23 2237 03/28/23 0417 03/28/23 0758  BP: 132/67 (!) 117/44 125/61 129/63  Pulse: 81 75 77 76  Resp: 16 17 16 16   Temp: 98.4 F (36.9 C) 98.4 F (36.9 C) 98.4 F (36.9 C) 97.6 F (36.4 C)  TempSrc:   Oral Oral  SpO2: 91% 94% 92% 100%  Weight:      Height:        Intake/Output Summary (Last 24 hours) at 03/28/2023 1434 Last data filed at 03/28/2023 0900 Gross per 24 hour  Intake 240 ml  Output --  Net 240 ml   Filed Weights   03/25/23 1046  Weight: 84.8 kg    Examination:  General exam: NAD Respiratory system: Lungs clear, normal WOB, RA Cardiovascular system: S1-S2, RRR, no murmurs, no pedal edema Gastrointestinal system: Soft, NT/ND, normal bowel sounds Central nervous system: Alert.  Oriented x 3.  No focal deficits Extremities: Symmetric 5 x 5 power. Skin: No rashes, lesions or ulcers Psychiatry: Judgement and insight appear normal. Mood & affect flattened.     Data Reviewed: I have personally reviewed following labs and imaging studies  CBC: Recent Labs  Lab 03/25/23 1052 03/26/23 1537 03/27/23 0903  WBC 7.2 3.8* 3.8*  NEUTROABS  --   --  1.2*  HGB 12.0 10.9* 11.2*  HCT 39.0 35.5* 37.2  MCV 94.9 95.7 96.4  PLT 286 212 231  Basic Metabolic Panel: Recent Labs  Lab 03/25/23 1052 03/26/23 1537 03/27/23 0903  NA 141 142 140  K 3.7 3.3* 3.2*  CL 99 104 105  CO2 28 27 27   GLUCOSE 156* 131* 120*  BUN 18 12 10   CREATININE 0.96 0.94 0.87  CALCIUM 9.4 8.4* 8.5*   GFR: Estimated Creatinine Clearance: 58.6 mL/min (by C-G formula based on SCr of 0.87 mg/dL). Liver Function Tests: Recent Labs  Lab  03/25/23 1052  AST 21  ALT 16  ALKPHOS 86  BILITOT 1.0  PROT 7.0  ALBUMIN 3.7   Recent Labs  Lab 03/25/23 1633  LIPASE 24   Recent Labs  Lab 03/25/23 1633  AMMONIA <10   Coagulation Profile: No results for input(s): "INR", "PROTIME" in the last 168 hours. Cardiac Enzymes: No results for input(s): "CKTOTAL", "CKMB", "CKMBINDEX", "TROPONINI" in the last 168 hours. BNP (last 3 results) No results for input(s): "PROBNP" in the last 8760 hours. HbA1C: No results for input(s): "HGBA1C" in the last 72 hours. CBG: No results for input(s): "GLUCAP" in the last 168 hours. Lipid Profile: No results for input(s): "CHOL", "HDL", "LDLCALC", "TRIG", "CHOLHDL", "LDLDIRECT" in the last 72 hours. Thyroid Function Tests: No results for input(s): "TSH", "T4TOTAL", "FREET4", "T3FREE", "THYROIDAB" in the last 72 hours. Anemia Panel: No results for input(s): "VITAMINB12", "FOLATE", "FERRITIN", "TIBC", "IRON", "RETICCTPCT" in the last 72 hours. Sepsis Labs: Recent Labs  Lab 03/27/23 0903  PROCALCITON <0.10    Recent Results (from the past 240 hours)  Resp panel by RT-PCR (RSV, Flu A&B, Covid) Anterior Nasal Swab     Status: Abnormal   Collection Time: 03/25/23  4:34 PM   Specimen: Anterior Nasal Swab  Result Value Ref Range Status   SARS Coronavirus 2 by RT PCR NEGATIVE NEGATIVE Final    Comment: (NOTE) SARS-CoV-2 target nucleic acids are NOT DETECTED.  The SARS-CoV-2 RNA is generally detectable in upper respiratory specimens during the acute phase of infection. The lowest concentration of SARS-CoV-2 viral copies this assay can detect is 138 copies/mL. A negative result does not preclude SARS-Cov-2 infection and should not be used as the sole basis for treatment or other patient management decisions. A negative result may occur with  improper specimen collection/handling, submission of specimen other than nasopharyngeal swab, presence of viral mutation(s) within the areas targeted  by this assay, and inadequate number of viral copies(<138 copies/mL). A negative result must be combined with clinical observations, patient history, and epidemiological information. The expected result is Negative.  Fact Sheet for Patients:  BloggerCourse.com  Fact Sheet for Healthcare Providers:  SeriousBroker.it  This test is no t yet approved or cleared by the [place] FDA and  has been authorized for detection and/or diagnosis of SARS-CoV-2 by FDA under an Emergency Use Authorization (EUA). This EUA will remain  in effect (meaning this test can be used) for the duration of the COVID-19 declaration under Section 564(b)(1) of the Act, 21 U.S.C.section 360bbb-3(b)(1), unless the authorization is terminated  or revoked sooner.       Influenza A by PCR POSITIVE (A) NEGATIVE Final   Influenza B by PCR NEGATIVE NEGATIVE Final    Comment: (NOTE) The Xpert Xpress SARS-CoV-2/FLU/RSV plus assay is intended as an aid in the diagnosis of influenza from Nasopharyngeal swab specimens and should not be used as a sole basis for treatment. Nasal washings and aspirates are unacceptable for Xpert Xpress SARS-CoV-2/FLU/RSV testing.  Fact Sheet for Patients: BloggerCourse.com  Fact Sheet for Healthcare Providers: SeriousBroker.it  This test is not yet approved or cleared by the [place] and has been authorized for detection and/or diagnosis of SARS-CoV-2 by FDA under an Emergency Use Authorization (EUA). This EUA will remain in effect (meaning this test can be used) for the duration of the COVID-19 declaration under Section 564(b)(1) of the Act, 21 U.S.C. section 360bbb-3(b)(1), unless the authorization is terminated or revoked.     Resp Syncytial Virus by PCR NEGATIVE NEGATIVE Final    Comment: (NOTE) Fact Sheet for Patients: BloggerCourse.com  Fact  Sheet for Healthcare Providers: SeriousBroker.it  This test is not yet approved or cleared by the [place] FDA and has been authorized for detection and/or diagnosis of SARS-CoV-2 by FDA under an Emergency Use Authorization (EUA). This EUA will remain in effect (meaning this test can be used) for the duration of the COVID-19 declaration under Section 564(b)(1) of the Act, 21 U.S.C. section 360bbb-3(b)(1), unless the authorization is terminated or revoked.  Performed at [facility], [address], [place], [place] [address]   Urine Culture (for pregnant, neutropenic or urologic patients or patients with an indwelling urinary catheter)     Status: Abnormal   Collection Time: 03/25/23  4:34 PM   Specimen: Urine, Random  Result Value Ref Range Status   Specimen Description   Final    URINE, RANDOM Performed at [facility], [address] [place], [place] [address]    Special Requests   Final    NONE Performed at [facility], [address] [place], [place] [address]    Culture MULTIPLE SPECIES PRESENT, SUGGEST RECOLLECTION (A)  Final   Report Status 03/27/2023 FINAL  Final  Culture, blood (Routine X 2) w Reflex to ID Panel     Status: None (Preliminary result)   Collection Time: 03/26/23  9:13 AM   Specimen: BLOOD  Result Value Ref Range Status   Specimen Description BLOOD BLOOD LEFT HAND  Final   Special Requests   Final    BOTTLES DRAWN AEROBIC ONLY Blood Culture results may not be optimal due to an inadequate volume of blood received in culture bottles   Culture   Final    NO GROWTH 2 DAYS Performed at [facility], [address], [place], [place] [address]    Report Status PENDING  Incomplete  Culture, blood (Routine X 2) w Reflex to ID Panel     Status: None (Preliminary result)   Collection Time: 03/26/23  9:18 AM   Specimen: BLOOD  Result Value Ref Range Status   Specimen Description BLOOD  BLOOD RIGHT HAND  Final   Special Requests   Final    BOTTLES DRAWN AEROBIC AND ANAEROBIC Blood Culture adequate volume   Culture   Final    NO GROWTH 2 DAYS Performed at [facility], [address] [place], [place] [address]    Report Status PENDING  Incomplete         Radiology Studies: No results found.       Scheduled Meds:  aspirin EC  81 mg Oral Daily   carvedilol  3.125 mg Oral BID WC   citalopram  20 mg Oral Daily   clonazePAM  0.5 mg Oral TID   diltiazem  120 mg Oral Daily   doxepin  50 mg Oral QHS   enoxaparin (LOVENOX) injection  40 mg Subcutaneous Q24H   fenofibrate  160 mg Oral Daily   fluticasone furoate-vilanterol  1 puff Inhalation Daily   levothyroxine  100 mcg Oral Q0600   oseltamivir  30 mg Oral BID   pantoprazole  40 mg Oral QAC supper   rosuvastatin  20 mg Oral Daily   sodium chloride flush  3 mL Intravenous Q12H   Vitamin D (Ergocalciferol)  50,000 Units Oral Once per day on Monday Thursday   Continuous Infusions:  cefTRIAXone (ROCEPHIN)  IV 1 g (03/27/23 2113)     LOS: 2 days     Tresa Moore, MD [facility]   If 7PM-7AM, please contact night-coverage  03/28/2023, 2:34 PM

## 2023-03-28 NOTE — Progress Notes (Signed)
Physical Therapy Treatment Patient Details Name: [name] MRN: [id number] DOB: Jun 24, 1950 Today's Date: 03/28/2023   History of Present Illness Pt is a 73 year old female brought in by family on account of frequent falls and generalized weakness with altered mentation.  Patient currently being managed for acute metabolic encephalopathy due to UTI, influenza A as well as sedating medication.        PMH significant for hypertension, hypothyroidism, COPD, GERD, arthritis, psoriasis on monoclonal antibody injections, anxiety disorder, peripheral vascular disease, status post recent laminectomy on 01/23    PT Comments  Pt alert, agreeable to PT, denied pain. She was modI for bed mobility, and able to transfer and ambulate with supervision and RW. No LOB today, but pt exhibited decreased activity tolerance/endurance. The patient would benefit from further skilled PT intervention to continue to progress towards goals.    If plan is discharge home, recommend the following: Assistance with cooking/housework;Direct supervision/assist for medications management;Direct supervision/assist for financial management;Assist for transportation;Help with stairs or ramp for entrance;Supervision due to cognitive status;A lot of help with bathing/dressing/bathroom;A little help with walking and/or transfers   Can travel by private vehicle     Yes  Equipment Recommendations  None recommended by PT    Recommendations for Other Services       Precautions / Restrictions Precautions Precautions: Fall;Back Precaution Booklet Issued: No Recall of Precautions/Restrictions: Impaired Precaution/Restrictions Comments: status post recent laminectomy on 01/23 Restrictions Weight Bearing Restrictions Per Provider Order: No     Mobility  Bed Mobility Overal bed mobility: Modified Independent                  Transfers Overall transfer level: Needs assistance Equipment used: Rolling walker (2  wheels) Transfers: Sit to/from Stand Sit to Stand: Contact guard assist                Ambulation/Gait Ambulation/Gait assistance: Contact guard assist Gait Distance (Feet): 90 Feet Assistive device: Rolling walker (2 wheels)   Gait velocity: decr         Stairs             Wheelchair Mobility     Tilt Bed    Modified Rankin (Stroke Patients Only)       Balance Overall balance assessment: Needs assistance Sitting-balance support: Feet supported Sitting balance-Leahy Scale: Fair       Standing balance-Leahy Scale: Fair Standing balance comment: static standing fair, pt with BUE support throughout                            Communication    Cognition Arousal: Alert Behavior During Therapy: WFL for tasks assessed/performed   PT - Cognitive impairments: No apparent impairments                       PT - Cognition Comments: pt oriented to self, year, location        Cueing    Exercises      General Comments        Pertinent Vitals/Pain Pain Assessment Pain Assessment: No/denies pain    Home Living                          Prior Function            PT Goals (current goals can now be found in the care plan section) Progress towards PT goals: Progressing  toward goals    Frequency    Min 1X/week      PT Plan      Co-evaluation              AM-PAC PT "6 Clicks" Mobility   Outcome Measure  Help needed turning from your back to your side while in a flat bed without using bedrails?: A Little Help needed moving from lying on your back to sitting on the side of a flat bed without using bedrails?: A Little Help needed moving to and from a bed to a chair (including a wheelchair)?: A Little Help needed standing up from a chair using your arms (e.g., wheelchair or bedside chair)?: A Little Help needed to walk in hospital room?: A Little Help needed climbing 3-5 steps with a railing? : A Little 6  Click Score: 18    End of Session Equipment Utilized During Treatment: Gait belt Activity Tolerance: Patient tolerated treatment well Patient left: with call bell/phone within reach;in bed;with bed alarm set Nurse Communication: Mobility status PT Visit Diagnosis: Repeated falls (R29.6);Muscle weakness (generalized) (M62.81);Difficulty in walking, not elsewhere classified (R26.2)     Time: 1610-9604 PT Time Calculation (min) (ACUTE ONLY): 9 min  Charges:    $Therapeutic Activity: 8-22 mins PT General Charges $$ ACUTE PT VISIT: 1 Visit                    Olga Coaster PT, DPT 3:32 PM,03/28/23

## 2023-03-28 NOTE — Progress Notes (Signed)
Occupational Therapy Treatment Patient Details Name: [name] MRN: [id number] DOB: 03-Jul-1950 Today's Date: 03/28/2023   History of present illness Pt is a 73 year old female brought in by family on account of frequent falls and generalized weakness with altered mentation.  Patient currently being managed for acute metabolic encephalopathy due to UTI, influenza A as well as sedating medication.        PMH significant for hypertension, hypothyroidism, COPD, GERD, arthritis, psoriasis on monoclonal antibody injections, anxiety disorder, peripheral vascular disease, status post recent laminectomy on 01/23   OT comments  Pt. reports feeling encouraged that she was able to make it to the nurse's station when walking with therapy this afternoon.  Pt. reports 7/10 Headache pain. Pt. was able to identify spinal precautions following recent laminectomy surgery. Pt. requires maxA LE ADLs. Pt. education was provided about A/E use for LE ADLs. Reviewed Pt. daily home routines, as well as anticipated home needs including DME. Pt. Continues to benefit from OT services for ADL training, A/E training, UE there. Ex. and Pt./caregiver education about home modification, and DME. OT discharge recommendations remain appropriate.      If plan is discharge home, recommend the following:  A little help with walking and/or transfers;Assist for transportation;Assistance with cooking/housework;A little help with bathing/dressing/bathroom   Equipment Recommendations       Recommendations for Other Services      Precautions / Restrictions Precautions Precautions: Fall Precaution Booklet Issued: No Recall of Precautions/Restrictions: Impaired Restrictions Weight Bearing Restrictions Per Provider Order: No       Mobility Bed Mobility Overal bed mobility: Modified Independent                  Transfers Overall transfer level: Needs assistance Equipment used: Rolling walker (2 wheels) Transfers: Sit  to/from Stand Sit to Stand: Contact guard assist                 Balance                                           ADL either performed or assessed with clinical judgement   ADL                       Lower Body Dressing: Maximal assistance                      Extremity/Trunk Assessment Upper Extremity Assessment Upper Extremity Assessment: Generalized weakness   Lower Extremity Assessment Lower Extremity Assessment: Generalized weakness   Cervical / Trunk Assessment Cervical / Trunk Assessment: Back Surgery    Vision Patient Visual Report: No change from baseline     Perception     Praxis     Communication Communication Communication: No apparent difficulties   Cognition Arousal: Alert Behavior During Therapy: WFL for tasks assessed/performed                                 Following commands: Intact        Cueing   Cueing Techniques: Verbal cues, Visual cues  Exercises      Shoulder Instructions       General Comments      Pertinent Vitals/ Pain       Pain Assessment Pain Assessment: 0-10 Pain Score: 7  Pain Location: headache Pain Descriptors / Indicators: Discomfort, Aching Pain Intervention(s): Premedicated before session, Limited activity within patient's tolerance, Monitored during session  Home Living                                          Prior Functioning/Environment              Frequency  Min 1X/week        Progress Toward Goals  OT Goals(current goals can now be found in the care plan section)  Progress towards OT goals: Progressing toward goals  Acute Rehab OT Goals Patient Stated Goal: To go home OT Goal Formulation: With patient/family Time For Goal Achievement: 04/10/23 Potential to Achieve Goals: Fair  Plan      Co-evaluation                 AM-PAC OT "6 Clicks" Daily Activity     Outcome Measure   Help from another person  eating meals?: None Help from another person taking care of personal grooming?: None Help from another person toileting, which includes using toliet, bedpan, or urinal?: A Lot Help from another person bathing (including washing, rinsing, drying)?: A Lot Help from another person to put on and taking off regular upper body clothing?: A Little Help from another person to put on and taking off regular lower body clothing?: A Lot 6 Click Score: 17    End of Session    OT Visit Diagnosis: Unsteadiness on feet (R26.81);History of falling (Z91.81);Other abnormalities of gait and mobility (R26.89);Repeated falls (R29.6);Pain Pain - Right/Left: Left   Activity Tolerance Patient tolerated treatment well   Patient Left with call bell/phone within reach;in chair;with chair alarm set   Nurse Communication Mobility status        Time: 1610-9604 OT Time Calculation (min): 25 min  Charges: OT General Charges $OT Visit: 1 Visit OT Treatments $Self Care/Home Management : 23-37 mins  Olegario Messier, MS, OTR/L  Olegario Messier 03/28/2023, 3:46 PM

## 2023-03-29 DIAGNOSIS — M79672 Pain in left foot: Secondary | ICD-10-CM | POA: Diagnosis not present

## 2023-03-29 DIAGNOSIS — F410 Panic disorder [episodic paroxysmal anxiety] without agoraphobia: Secondary | ICD-10-CM | POA: Diagnosis not present

## 2023-03-29 DIAGNOSIS — N1831 Chronic kidney disease, stage 3a: Secondary | ICD-10-CM | POA: Diagnosis not present

## 2023-03-29 DIAGNOSIS — I120 Hypertensive chronic kidney disease with stage 5 chronic kidney disease or end stage renal disease: Secondary | ICD-10-CM | POA: Diagnosis not present

## 2023-03-29 DIAGNOSIS — F321 Major depressive disorder, single episode, moderate: Secondary | ICD-10-CM | POA: Diagnosis not present

## 2023-03-29 DIAGNOSIS — N390 Urinary tract infection, site not specified: Secondary | ICD-10-CM | POA: Diagnosis not present

## 2023-03-29 DIAGNOSIS — G2581 Restless legs syndrome: Secondary | ICD-10-CM | POA: Diagnosis not present

## 2023-03-29 DIAGNOSIS — F330 Major depressive disorder, recurrent, mild: Secondary | ICD-10-CM | POA: Diagnosis not present

## 2023-03-29 DIAGNOSIS — J101 Influenza due to other identified influenza virus with other respiratory manifestations: Secondary | ICD-10-CM | POA: Diagnosis not present

## 2023-03-29 DIAGNOSIS — E559 Vitamin D deficiency, unspecified: Secondary | ICD-10-CM | POA: Diagnosis not present

## 2023-03-29 DIAGNOSIS — E1122 Type 2 diabetes mellitus with diabetic chronic kidney disease: Secondary | ICD-10-CM | POA: Diagnosis not present

## 2023-03-29 DIAGNOSIS — M79671 Pain in right foot: Secondary | ICD-10-CM | POA: Diagnosis not present

## 2023-03-29 DIAGNOSIS — M961 Postlaminectomy syndrome, not elsewhere classified: Secondary | ICD-10-CM | POA: Diagnosis not present

## 2023-03-29 DIAGNOSIS — J09X2 Influenza due to identified novel influenza A virus with other respiratory manifestations: Secondary | ICD-10-CM | POA: Diagnosis not present

## 2023-03-29 DIAGNOSIS — Z7982 Long term (current) use of aspirin: Secondary | ICD-10-CM | POA: Diagnosis not present

## 2023-03-29 DIAGNOSIS — E039 Hypothyroidism, unspecified: Secondary | ICD-10-CM | POA: Diagnosis not present

## 2023-03-29 DIAGNOSIS — G4700 Insomnia, unspecified: Secondary | ICD-10-CM | POA: Diagnosis not present

## 2023-03-29 DIAGNOSIS — R1310 Dysphagia, unspecified: Secondary | ICD-10-CM | POA: Diagnosis not present

## 2023-03-29 DIAGNOSIS — M10071 Idiopathic gout, right ankle and foot: Secondary | ICD-10-CM | POA: Diagnosis not present

## 2023-03-29 DIAGNOSIS — G9341 Metabolic encephalopathy: Secondary | ICD-10-CM | POA: Diagnosis not present

## 2023-03-29 DIAGNOSIS — L409 Psoriasis, unspecified: Secondary | ICD-10-CM | POA: Diagnosis not present

## 2023-03-29 DIAGNOSIS — N183 Chronic kidney disease, stage 3 unspecified: Secondary | ICD-10-CM | POA: Diagnosis not present

## 2023-03-29 DIAGNOSIS — E1151 Type 2 diabetes mellitus with diabetic peripheral angiopathy without gangrene: Secondary | ICD-10-CM | POA: Diagnosis not present

## 2023-03-29 DIAGNOSIS — M1990 Unspecified osteoarthritis, unspecified site: Secondary | ICD-10-CM | POA: Diagnosis not present

## 2023-03-29 DIAGNOSIS — K219 Gastro-esophageal reflux disease without esophagitis: Secondary | ICD-10-CM | POA: Diagnosis not present

## 2023-03-29 DIAGNOSIS — J449 Chronic obstructive pulmonary disease, unspecified: Secondary | ICD-10-CM | POA: Diagnosis not present

## 2023-03-29 DIAGNOSIS — E7800 Pure hypercholesterolemia, unspecified: Secondary | ICD-10-CM | POA: Diagnosis not present

## 2023-03-29 DIAGNOSIS — I129 Hypertensive chronic kidney disease with stage 1 through stage 4 chronic kidney disease, or unspecified chronic kidney disease: Secondary | ICD-10-CM | POA: Diagnosis not present

## 2023-03-29 MED ORDER — CITALOPRAM HYDROBROMIDE 20 MG PO TABS: 20.0000 mg | ORAL_TABLET | Freq: Every day | ORAL | Status: DC

## 2023-03-29 MED ORDER — POLYETHYLENE GLYCOL 3350 17 G PO PACK
17.0000 g | PACK | Freq: Every day | ORAL | Status: DC
  Administered 2023-03-29: 17 g via ORAL
  Filled 2023-03-29: qty 1

## 2023-03-29 MED ORDER — OSELTAMIVIR PHOSPHATE 30 MG PO CAPS: 30.0000 mg | ORAL_CAPSULE | Freq: Two times a day (BID) | ORAL | Status: AC

## 2023-03-29 MED ORDER — CLONAZEPAM 1 MG PO TABS: ORAL_TABLET | ORAL | 0 refills | Status: DC

## 2023-03-29 MED ORDER — BENZONATATE 200 MG PO CAPS: 200.0000 mg | ORAL_CAPSULE | Freq: Three times a day (TID) | ORAL | Status: DC | PRN

## 2023-03-29 NOTE — Discharge Summary (Signed)
Physician Discharge Summary  [name] NWG:[id number] DOB: October 04, 1950 DOA: 03/25/2023  PCP: Lorre Munroe, NP  Admit date: 03/25/2023 Discharge date: 03/29/2023  Admitted From: Home Disposition:  SNF  Recommendations for Outpatient Follow-up:  Follow up with PCP in 1-2 weeks   Home Health:No Equipment/Devices:None   Discharge Condition:Stable  CODE STATUS:FULL  Diet recommendation: heart healthy  Brief/Interim Summary:    [name] is a 73 y.o. female with medical history significant of hypertension, hypothyroidism, COPD, GERD, arthritis, psoriasis on monoclonal antibody injections, anxiety disorder, peripheral vascular disease, status post recent laminectomy on 01/23.  Patient was brought in by family on account of frequent falls and generalized weakness with altered mentation.  Patient currently being managed for acute metabolic encephalopathy due to UTI, influenza A as well as sedating medication.     Discharge Diagnoses:  Principal Problem:   Acute metabolic encephalopathy  Acute Metabolic encephalopathy: Likely medication induced as well as in the setting of UTI.  Likely culprits include Flexeril and tramadol.  Plan: Cautiously restart flexeril and clonazepam  Acute urinary tract infection:  Likely acute cystitis.  Urinalysis with contamination Plan: Complete 3-day course of Rocephin (now complete)   Acute influenza A infection.   Additional 3 days tamiflu prescribed on dc   COPD: Not in any acute exacerbation at this time.   Continue as needed nebulization   Hypothyroidism, GERD: Continue with home medications.   Psoriasis: Chronic.  Patient is on monoclonal antibody injections.  High risk of immunocompromise   Status post laminectomy on 01/23-clinically stable.   PT OT consulted Will need skilled nursing facility Plan for [facility] Insurance authorization initiated Bed avail on 2/21   Right foot pain: Seen and recently treated by PCP. Xray per  patient was reported to be unremarkable for fracture. Patient was treated for suspected gout flare.LLE Duplex was negative for DVT  Discharge Instructions  Discharge Instructions     Diet - low sodium heart healthy   Complete by: As directed    Increase activity slowly   Complete by: As directed       Allergies as of 03/29/2023       Reactions   Ciprofloxacin Hives, Diarrhea   Gabapentin Other (See Comments)   Hallucination   Trazodone And Nefazodone Other (See Comments)   Altered Mental Status   Trazodone Other (See Comments)   Mucinex [guaifenesin Er] Nausea Only   Oxycodone Diarrhea   Oxycodone-acetaminophen Diarrhea   Oxycodone-acetaminophen Palpitations   Sulfa Antibiotics Rash   Throat, mouth Throat, mouth        Medication List     STOP taking these medications    clobetasol cream 0.05 % Commonly known as: TEMOVATE   doxycycline 100 MG capsule Commonly known as: VIBRAMYCIN   fluticasone furoate-vilanterol 100-25 MCG/ACT Aepb Commonly known as: BREO ELLIPTA   traMADol 50 MG tablet Commonly known as: ULTRAM   Trelegy Ellipta 100-62.5-25 MCG/ACT Aepb Generic drug: Fluticasone-Umeclidin-Vilant   Vitamin D (Ergocalciferol) 1.25 MG (50000 UNIT) Caps capsule Commonly known as: DRISDOL       TAKE these medications    acetaminophen 500 MG tablet Commonly known as: TYLENOL Take 500 mg by mouth every 6 (six) hours as needed for moderate pain.   albuterol 108 (90 Base) MCG/ACT inhaler Commonly known as: VENTOLIN HFA INHALE 2 PUFFS BY MOUTH EVERY 6 HOURS AS NEEDED FOR WHEEZING OR SHORTNESS OF BREATH   Aspirin Low Dose 81 MG tablet Generic drug: aspirin EC TAKE 1 TABLET(81 MG)  BY MOUTH DAILY. SWALLOW WHOLE   benzonatate 200 MG capsule Commonly known as: TESSALON Take 1 capsule (200 mg total) by mouth 3 (three) times daily as needed for cough.   carvedilol 3.125 MG tablet Commonly known as: COREG TAKE 1 TABLET BY MOUTH TWICE DAILY WITH A MEAL    citalopram 20 MG tablet Commonly known as: CELEXA Take 1 tablet (20 mg total) by mouth daily. Start taking on: March 30, 2023 What changed:  medication strength See the new instructions.   clonazePAM 1 MG tablet Commonly known as: KLONOPIN TAKE 1 TABLET BY MOUTH EVERY MORNING, 1/2 TABLET EVERY AFTERNOON AND 1 TABLET EVERY NIGHT AT BEDTIME.  SNF use only What changed: additional instructions   cyclobenzaprine 10 MG tablet Commonly known as: FLEXERIL Take 1 tablet (10 mg total) by mouth 3 (three) times daily as needed for muscle spasms.   diltiazem 120 MG 24 hr capsule Commonly known as: CARDIZEM CD TAKE 1 CAPSULE BY MOUTH EVERY DAY   doxepin 25 MG capsule Commonly known as: SINEQUAN Take 50 mg by mouth at bedtime.   fenofibrate 145 MG tablet Commonly known as: TRICOR Take 1 tablet (145 mg total) by mouth daily.   hydrOXYzine 25 MG tablet Commonly known as: ATARAX Take 25 mg by mouth at bedtime.   ipratropium-albuterol 0.5-2.5 (3) MG/3ML Soln Commonly known as: DUONEB Take 3 mLs by nebulization every 6 (six) hours as needed (wheezing / shortness of breath).   levothyroxine 100 MCG tablet Commonly known as: SYNTHROID Take 1 tablet (100 mcg total) by mouth daily.   lisinopril 10 MG tablet Commonly known as: ZESTRIL TAKE 1 TABLET(10 MG) BY MOUTH DAILY   oseltamivir 30 MG capsule Commonly known as: TAMIFLU Take 1 capsule (30 mg total) by mouth 2 (two) times daily for 3 days.   pantoprazole 40 MG tablet Commonly known as: PROTONIX SMARTSIG:1 Tablet(s) By Mouth Every Evening   Quad Cane/Small Base Misc 1 Device by Does not apply route daily.   rosuvastatin 20 MG tablet Commonly known as: Crestor Take 1 tablet (20 mg total) by mouth daily.   senna 8.6 MG Tabs tablet Commonly known as: SENOKOT Take 1 tablet (8.6 mg total) by mouth 2 (two) times daily as needed for mild constipation.   triamcinolone ointment 0.1 % Commonly known as: KENALOG Apply 1  Application topically as needed.        Contact information for after-discharge care     Destination     [facility] Preferred SNF .   Service: Skilled Nursing Contact information: [address] [place] [address] [phone]                    Allergies  Allergen Reactions   Ciprofloxacin Hives and Diarrhea   Gabapentin Other (See Comments)    Hallucination   Trazodone And Nefazodone Other (See Comments)    Altered Mental Status    Trazodone Other (See Comments)   Mucinex [Guaifenesin Er] Nausea Only   Oxycodone Diarrhea   Oxycodone-Acetaminophen Diarrhea   Oxycodone-Acetaminophen Palpitations   Sulfa Antibiotics Rash    Throat, mouth Throat, mouth    Consultations: None   Procedures/Studies: DG Chest 1 View Result Date: 03/25/2023 CLINICAL DATA:  Shortness of breath EXAM: CHEST  1 VIEW COMPARISON:  01/10/2023, 01/02/2023 FINDINGS: Low lung volumes. Streaky bibasilar atelectasis. Stable cardiomediastinal silhouette with aortic atherosclerosis. No pneumothorax IMPRESSION: Low lung volumes with streaky bibasilar atelectasis. Electronically  Signed   By: Jasmine Pang M.D.   On: 03/25/2023 21:54   CT ABDOMEN PELVIS W CONTRAST Result Date: 03/25/2023 CLINICAL DATA:  Left lower quadrant abdominal pain EXAM: CT ABDOMEN AND PELVIS WITH CONTRAST TECHNIQUE: Multidetector CT imaging of the abdomen and pelvis was performed using the standard protocol following bolus administration of intravenous contrast. RADIATION DOSE REDUCTION: This exam was performed according to the departmental dose-optimization program which includes automated exposure control, adjustment of the mA and/or kV according to patient size and/or use of iterative reconstruction technique. CONTRAST:  OMNIPAQUE IOHEXOL 300 MG/ML  SOLN COMPARISON:  01/10/2023 FINDINGS: Lower chest: No acute abnormality.  Previously noted left pleural effusion has resolved. Extensive coronary artery calcification. Hepatobiliary: Mild hepatic steatosis. No enhancing intrahepatic mass. No intra or extrahepatic biliary ductal dilation. Gallbladder unremarkable. Pancreas: Unremarkable Spleen: Unremarkable Adrenals/Urinary Tract: 2 cm left adrenal nodule is stable since prior examination and measures 52 Hounsfield units on portal venous phase images and 29 Hounsfield units 4 minute delayed images and is technically indeterminate. This most likely represents a benign adrenal adenoma and could be confirmed with dedicated adrenal mass protocol CT or MR imaging in year. The right adrenal gland is unremarkable. The kidneys are normal in size and position. 2 mm cortical calcification within the lower pole the right kidney is nonspecific, likely the result remote trauma or inflammation. The kidneys are otherwise unremarkable. The bladder is unremarkable. Stomach/Bowel: Mild sigmoid diverticulosis. Stomach, small bowel, and large bowel are otherwise unremarkable. Appendix normal. No evidence of obstruction or focal inflammation. No free intraperitoneal gas or fluid. Vascular/Lymphatic: Aortic atherosclerosis. No enlarged abdominal or pelvic lymph nodes. Reproductive: Status post hysterectomy. Stable 5.1 cm minimally complex cyst within the left ovary demonstrating a single thin noncalcified internal septation on MRI examination of 12/19/2022. No follow-up imaging is recommended for this lesion. Other: Moderate fat containing umbilical hernia. Musculoskeletal: Bilateral laminectomy and posterior decompression of L4 and L5 has been performed. Degenerative changes are seen throughout the visualized thoracolumbar spine. No acute bone abnormality. No lytic or blastic bone lesion. IMPRESSION: 1. No acute intra-abdominal pathology identified. No definite radiographic explanation for the patient's reported symptoms. 2. Extensive coronary artery  calcification. 3. Mild hepatic steatosis. 4. Mild sigmoid diverticulosis without superimposed acute inflammatory change. 5. Stable 2 cm left adrenal nodule, technically indeterminate, but most likely representing a benign adrenal adenoma. This could be confirmed with dedicated adrenal mass protocol CT or MR imaging in year. 6. Moderate fat containing umbilical hernia. Aortic Atherosclerosis (ICD10-I70.0). Electronically Signed   By: Helyn Numbers M.D.   On: 03/25/2023 19:43   CT Head Wo Contrast Result Date: 03/25/2023 CLINICAL DATA:  Mental status change, unknown cause. EXAM: CT HEAD WITHOUT CONTRAST TECHNIQUE: Contiguous axial images were obtained from the base of the skull through the vertex without intravenous contrast. RADIATION DOSE REDUCTION: This exam was performed according to the departmental dose-optimization program which includes automated exposure control, adjustment of the mA and/or kV according to patient size and/or use of iterative reconstruction technique. COMPARISON:  Head CT 12/01/2017 FINDINGS: Brain: There is no evidence of an acute infarct, intracranial hemorrhage, mass, midline shift, or extra-axial fluid collection. Mild cerebral atrophy is within normal limits for age. Vascular: Calcified atherosclerosis at the skull base. No hyperdense vessel. Skull: No acute fracture or suspicious osseous lesion. Sinuses/Orbits: Chronic right maxillary sinusitis with complete sinus opacification. Clear mastoid air cells. Bilateral cataract extraction. Other: None. IMPRESSION: No evidence of acute intracranial abnormality. Electronically Signed   By: Freida Busman  Mosetta Putt M.D.   On: 03/25/2023 14:44   US Venous Img Lower Unilateral Left Result Date: 03/25/2023 CLINICAL DATA:  LLE swelling, pain; recent surgery EXAM: LEFT LOWER EXTREMITY VENOUS DOPPLER ULTRASOUND TECHNIQUE: Gray-scale sonography with compression, as well as color and duplex ultrasound, were performed to evaluate the deep venous system(s)  from the level of the common femoral vein through the popliteal and proximal calf veins. COMPARISON:  CT AP, 01/10/2023. FINDINGS: VENOUS Normal compressibility of the common femoral, superficial femoral, and popliteal veins, as well as the visualized calf veins. Visualized portions of profunda femoral vein and great saphenous vein unremarkable. No filling defects to suggest DVT on grayscale or color Doppler imaging. Doppler waveforms show normal direction of venous flow, normal respiratory plasticity and response to augmentation. Limited views of the contralateral common femoral vein are unremarkable. OTHER No evidence of superficial thrombophlebitis or abnormal fluid collection. Limitations: none IMPRESSION: No evidence of femoropopliteal DVT or superficial thrombophlebitis within the LEFT lower extremity. Roanna Banning, MD [facility] Radiology Electronically Signed   By: Roanna Banning M.D.   On: 03/25/2023 12:37   DG Lumbar Spine 2-3 Views Result Date: 02/28/2023 CLINICAL DATA:  L3-5 laminectomy and discectomy. EXAM: LUMBAR SPINE - 2-3 VIEW COMPARISON:  Lumbar spine radiographs 02/04/2023. Abdominopelvic CT 01/10/2023. FINDINGS: C-arm fluoroscopy was provided in the operating room without the presence of a radiologist.1.2 seconds fluoroscopy time. 0.62 mGy air kerma. Two C-arm fluoroscopic images were obtained intraoperatively and are submitted for post operative interpretation. These images demonstrate posterior localization at the L4 and L5 levels with multiple posterior surgical instruments. Please see intraoperative findings for further detail. IMPRESSION: Intraoperative fluoroscopic guidance for lumbar surgery. Electronically Signed   By: Carey Bullocks M.D.   On: 02/28/2023 18:57   DG C-Arm 1-60 Min-No Report Result Date: 02/28/2023 Fluoroscopy was utilized by the requesting physician.  No radiographic interpretation.   DG C-Arm 1-60 Min-No  Report Result Date: 02/28/2023 Fluoroscopy was utilized by the requesting physician.  No radiographic interpretation.   DG C-Arm 1-60 Min-No Report Result Date: 02/28/2023 Fluoroscopy was utilized by the requesting physician.  No radiographic interpretation.      Subjective: Seen and examined on day of dc.  Stable, appropriate for dispo to SNF  Discharge Exam: Vitals:   03/29/23 0406 03/29/23 0742  BP: (!) 117/57 (!) 156/68  Pulse: 73 86  Resp: 16 17  Temp: 97.9 F (36.6 C) 97.7 F (36.5 C)  SpO2: 92% 93%   Vitals:   03/28/23 1630 03/28/23 2009 03/29/23 0406 03/29/23 0742  BP: 130/65 (!) 120/42 (!) 117/57 (!) 156/68  Pulse: 76 74 73 86  Resp: 16 16 16 17   Temp: 97.8 F (36.6 C) 98.4 F (36.9 C) 97.9 F (36.6 C) 97.7 F (36.5 C)  TempSrc:  Oral Oral Oral  SpO2: 100% 96% 92% 93%  Weight:      Height:        General: Pt is alert, awake, not in acute distress Cardiovascular: RRR, S1/S2 +, no rubs, no gallops Respiratory: CTA bilaterally, no wheezing, no rhonchi Abdominal: Soft, NT, ND, bowel sounds + Extremities: no edema, no cyanosis    The results of significant diagnostics from this hospitalization (including imaging, microbiology, ancillary and laboratory) are listed below for reference.     Microbiology: Recent Results (from the past 240 hours)  Resp panel by RT-PCR (RSV, Flu A&B, Covid) Anterior Nasal Swab     Status: Abnormal   Collection Time: 03/25/23  4:34  PM   Specimen: Anterior Nasal Swab  Result Value Ref Range Status   SARS Coronavirus 2 by RT PCR NEGATIVE NEGATIVE Final    Comment: (NOTE) SARS-CoV-2 target nucleic acids are NOT DETECTED.  The SARS-CoV-2 RNA is generally detectable in upper respiratory specimens during the acute phase of infection. The lowest concentration of SARS-CoV-2 viral copies this assay can detect is 138 copies/mL. A negative result does not preclude SARS-Cov-2 infection and should not be used as the sole basis for  treatment or other patient management decisions. A negative result may occur with  improper specimen collection/handling, submission of specimen other than nasopharyngeal swab, presence of viral mutation(s) within the areas targeted by this assay, and inadequate number of viral copies(<138 copies/mL). A negative result must be combined with clinical observations, patient history, and epidemiological information. The expected result is Negative.  Fact Sheet for Patients:  BloggerCourse.com  Fact Sheet for Healthcare Providers:  SeriousBroker.it  This test is no t yet approved or cleared by the [place] FDA and  has been authorized for detection and/or diagnosis of SARS-CoV-2 by FDA under an Emergency Use Authorization (EUA). This EUA will remain  in effect (meaning this test can be used) for the duration of the COVID-19 declaration under Section 564(b)(1) of the Act, 21 U.S.C.section 360bbb-3(b)(1), unless the authorization is terminated  or revoked sooner.       Influenza A by PCR POSITIVE (A) NEGATIVE Final   Influenza B by PCR NEGATIVE NEGATIVE Final    Comment: (NOTE) The Xpert Xpress SARS-CoV-2/FLU/RSV plus assay is intended as an aid in the diagnosis of influenza from Nasopharyngeal swab specimens and should not be used as a sole basis for treatment. Nasal washings and aspirates are unacceptable for Xpert Xpress SARS-CoV-2/FLU/RSV testing.  Fact Sheet for Patients: BloggerCourse.com  Fact Sheet for Healthcare Providers: SeriousBroker.it  This test is not yet approved or cleared by the [place] FDA and has been authorized for detection and/or diagnosis of SARS-CoV-2 by FDA under an Emergency Use Authorization (EUA). This EUA will remain in effect (meaning this test can be used) for the duration of the COVID-19 declaration under Section 564(b)(1) of the Act, 21  U.S.C. section 360bbb-3(b)(1), unless the authorization is terminated or revoked.     Resp Syncytial Virus by PCR NEGATIVE NEGATIVE Final    Comment: (NOTE) Fact Sheet for Patients: BloggerCourse.com  Fact Sheet for Healthcare Providers: SeriousBroker.it  This test is not yet approved or cleared by the [place] FDA and has been authorized for detection and/or diagnosis of SARS-CoV-2 by FDA under an Emergency Use Authorization (EUA). This EUA will remain in effect (meaning this test can be used) for the duration of the COVID-19 declaration under Section 564(b)(1) of the Act, 21 U.S.C. section 360bbb-3(b)(1), unless the authorization is terminated or revoked.  Performed at [facility], [address], [place], [place] [address]   Urine Culture (for pregnant, neutropenic or urologic patients or patients with an indwelling urinary catheter)     Status: Abnormal   Collection Time: 03/25/23  4:34 PM   Specimen: Urine, Random  Result Value Ref Range Status   Specimen Description   Final    URINE, RANDOM Performed at [facility], [address], [place], [place] [address]    Special Requests   Final    NONE Performed at [facility], [address], [place], [place] [address]    Culture MULTIPLE SPECIES PRESENT, SUGGEST RECOLLECTION (A)  Final   Report  Status 03/27/2023 FINAL  Final  Culture, blood (Routine X 2) w Reflex to ID Panel     Status: None (Preliminary result)   Collection Time: 03/26/23  9:13 AM   Specimen: BLOOD  Result Value Ref Range Status   Specimen Description BLOOD BLOOD LEFT HAND  Final   Special Requests   Final    BOTTLES DRAWN AEROBIC ONLY Blood Culture results may not be optimal due to an inadequate volume of blood received in culture bottles   Culture   Final    NO GROWTH 3 DAYS Performed at [facility], [address], [place], [place] [address]    Report  Status PENDING  Incomplete  Culture, blood (Routine X 2) w Reflex to ID Panel     Status: None (Preliminary result)   Collection Time: 03/26/23  9:18 AM   Specimen: BLOOD  Result Value Ref Range Status   Specimen Description BLOOD BLOOD RIGHT HAND  Final   Special Requests   Final    BOTTLES DRAWN AEROBIC AND ANAEROBIC Blood Culture adequate volume   Culture   Final    NO GROWTH 3 DAYS Performed at [facility], [address] [place], [place] [address]    Report Status PENDING  Incomplete     Labs: BNP (last 3 results) No results for input(s): "BNP" in the last 8760 hours. Basic Metabolic Panel: Recent Labs  Lab 03/25/23 1052 03/26/23 1537 03/27/23 0903  NA 141 142 140  K 3.7 3.3* 3.2*  CL 99 104 105  CO2 28 27 27   GLUCOSE 156* 131* 120*  BUN 18 12 10   CREATININE 0.96 0.94 0.87  CALCIUM 9.4 8.4* 8.5*   Liver Function Tests: Recent Labs  Lab 03/25/23 1052  AST 21  ALT 16  ALKPHOS 86  BILITOT 1.0  PROT 7.0  ALBUMIN 3.7   Recent Labs  Lab 03/25/23 1633  LIPASE 24   Recent Labs  Lab 03/25/23 1633  AMMONIA <10   CBC: Recent Labs  Lab 03/25/23 1052 03/26/23 1537 03/27/23 0903  WBC 7.2 3.8* 3.8*  NEUTROABS  --   --  1.2*  HGB 12.0 10.9* 11.2*  HCT 39.0 35.5* 37.2  MCV 94.9 95.7 96.4  PLT 286 212 231   Cardiac Enzymes: No results for input(s): "CKTOTAL", "CKMB", "CKMBINDEX", "TROPONINI" in the last 168 hours. BNP: Invalid input(s): "POCBNP" CBG: No results for input(s): "GLUCAP" in the last 168 hours. D-Dimer No results for input(s): "DDIMER" in the last 72 hours. Hgb A1c No results for input(s): "HGBA1C" in the last 72 hours. Lipid Profile No results for input(s): "CHOL", "HDL", "LDLCALC", "TRIG", "CHOLHDL", "LDLDIRECT" in the last 72 hours. Thyroid function studies No results for input(s): "TSH", "T4TOTAL", "T3FREE", "THYROIDAB" in the last 72 hours.  Invalid input(s): "FREET3" Anemia work up No results for input(s):  "VITAMINB12", "FOLATE", "FERRITIN", "TIBC", "IRON", "RETICCTPCT" in the last 72 hours. Urinalysis    Component Value Date/Time   COLORURINE YELLOW (A) 03/25/2023 1634   APPEARANCEUR CLOUDY (A) 03/25/2023 1634   APPEARANCEUR Clear 07/26/2016 1445   LABSPEC 1.038 (H) 03/25/2023 1634   LABSPEC 1.012 10/10/2011 1219   PHURINE 5.0 03/25/2023 1634   GLUCOSEU NEGATIVE 03/25/2023 1634   GLUCOSEU Negative 10/10/2011 1219   HGBUR NEGATIVE 03/25/2023 1634   BILIRUBINUR NEGATIVE 03/25/2023 1634   BILIRUBINUR Negative 08/28/2022 1537   BILIRUBINUR Negative 07/26/2016 1445   BILIRUBINUR Negative 10/10/2011 1219   KETONESUR NEGATIVE 03/25/2023 1634   PROTEINUR NEGATIVE 03/25/2023 1634  UROBILINOGEN 0.2 08/28/2022 1537   NITRITE NEGATIVE 03/25/2023 1634   LEUKOCYTESUR MODERATE (A) 03/25/2023 1634   LEUKOCYTESUR 2+ 10/10/2011 1219   Sepsis Labs Recent Labs  Lab 03/25/23 1052 03/26/23 1537 03/27/23 0903  WBC 7.2 3.8* 3.8*   Microbiology Recent Results (from the past 240 hours)  Resp panel by RT-PCR (RSV, Flu A&B, Covid) Anterior Nasal Swab     Status: Abnormal   Collection Time: 03/25/23  4:34 PM   Specimen: Anterior Nasal Swab  Result Value Ref Range Status   SARS Coronavirus 2 by RT PCR NEGATIVE NEGATIVE Final    Comment: (NOTE) SARS-CoV-2 target nucleic acids are NOT DETECTED.  The SARS-CoV-2 RNA is generally detectable in upper respiratory specimens during the acute phase of infection. The lowest concentration of SARS-CoV-2 viral copies this assay can detect is 138 copies/mL. A negative result does not preclude SARS-Cov-2 infection and should not be used as the sole basis for treatment or other patient management decisions. A negative result may occur with  improper specimen collection/handling, submission of specimen other than nasopharyngeal swab, presence of viral mutation(s) within the areas targeted by this assay, and inadequate number of viral copies(<138 copies/mL). A  negative result must be combined with clinical observations, patient history, and epidemiological information. The expected result is Negative.  Fact Sheet for Patients:  BloggerCourse.com  Fact Sheet for Healthcare Providers:  SeriousBroker.it  This test is no t yet approved or cleared by the [place] FDA and  has been authorized for detection and/or diagnosis of SARS-CoV-2 by FDA under an Emergency Use Authorization (EUA). This EUA will remain  in effect (meaning this test can be used) for the duration of the COVID-19 declaration under Section 564(b)(1) of the Act, 21 U.S.C.section 360bbb-3(b)(1), unless the authorization is terminated  or revoked sooner.       Influenza A by PCR POSITIVE (A) NEGATIVE Final   Influenza B by PCR NEGATIVE NEGATIVE Final    Comment: (NOTE) The Xpert Xpress SARS-CoV-2/FLU/RSV plus assay is intended as an aid in the diagnosis of influenza from Nasopharyngeal swab specimens and should not be used as a sole basis for treatment. Nasal washings and aspirates are unacceptable for Xpert Xpress SARS-CoV-2/FLU/RSV testing.  Fact Sheet for Patients: BloggerCourse.com  Fact Sheet for Healthcare Providers: SeriousBroker.it  This test is not yet approved or cleared by the [place] FDA and has been authorized for detection and/or diagnosis of SARS-CoV-2 by FDA under an Emergency Use Authorization (EUA). This EUA will remain in effect (meaning this test can be used) for the duration of the COVID-19 declaration under Section 564(b)(1) of the Act, 21 U.S.C. section 360bbb-3(b)(1), unless the authorization is terminated or revoked.     Resp Syncytial Virus by PCR NEGATIVE NEGATIVE Final    Comment: (NOTE) Fact Sheet for Patients: BloggerCourse.com  Fact Sheet for Healthcare  Providers: SeriousBroker.it  This test is not yet approved or cleared by the [place] FDA and has been authorized for detection and/or diagnosis of SARS-CoV-2 by FDA under an Emergency Use Authorization (EUA). This EUA will remain in effect (meaning this test can be used) for the duration of the COVID-19 declaration under Section 564(b)(1) of the Act, 21 U.S.C. section 360bbb-3(b)(1), unless the authorization is terminated or revoked.  Performed at [facility], [address], [place], [place] [address]   Urine Culture (for pregnant, neutropenic or urologic patients or patients with an indwelling urinary catheter)     Status: Abnormal   Collection Time: 03/25/23  4:34 PM   Specimen: Urine, Random  Result Value Ref Range Status   Specimen Description   Final    URINE, RANDOM Performed at [facility], [address] [place], [place] [address]    Special Requests   Final    NONE Performed at [facility], [address] [place], [place] [address]    Culture MULTIPLE SPECIES PRESENT, SUGGEST RECOLLECTION (A)  Final   Report Status 03/27/2023 FINAL  Final  Culture, blood (Routine X 2) w Reflex to ID Panel     Status: None (Preliminary result)   Collection Time: 03/26/23  9:13 AM   Specimen: BLOOD  Result Value Ref Range Status   Specimen Description BLOOD BLOOD LEFT HAND  Final   Special Requests   Final    BOTTLES DRAWN AEROBIC ONLY Blood Culture results may not be optimal due to an inadequate volume of blood received in culture bottles   Culture   Final    NO GROWTH 3 DAYS Performed at [facility], [address] [place], [place] [address]    Report Status PENDING  Incomplete  Culture, blood (Routine X 2) w Reflex to ID Panel     Status: None (Preliminary result)   Collection Time: 03/26/23  9:18 AM   Specimen: BLOOD  Result Value Ref Range Status   Specimen Description BLOOD BLOOD RIGHT HAND  Final    Special Requests   Final    BOTTLES DRAWN AEROBIC AND ANAEROBIC Blood Culture adequate volume   Culture   Final    NO GROWTH 3 DAYS Performed at [facility], [address], [place], [place] [address]    Report Status PENDING  Incomplete     Time coordinating discharge: Over 30 minutes  SIGNED:   Tresa Moore, MD  [facility] 03/29/2023, 2:29 PM Pager   If 7PM-7AM, please contact night-coverage

## 2023-03-29 NOTE — Progress Notes (Signed)
PROGRESS NOTE    [name]  YNW:[id number] DOB: April 27, 1950 DOA: 03/25/2023 PCP: Lorre Munroe, NP    Brief Narrative:    [name] is a 73 y.o. female with medical history significant of hypertension, hypothyroidism, COPD, GERD, arthritis, psoriasis on monoclonal antibody injections, anxiety disorder, peripheral vascular disease, status post recent laminectomy on 01/23.  Patient was brought in by family on account of frequent falls and generalized weakness with altered mentation.  Patient currently being managed for acute metabolic encephalopathy due to UTI, influenza A as well as sedating medication.    Assessment & Plan:   Principal Problem:   Acute metabolic encephalopathy  Acute Metabolic encephalopathy: Likely medication induced as well as in the setting of UTI.  Likely culprits include Flexeril and tramadol.  CT head was unremarkable. Continue to hold Flexeril and tramadol  Mental status improving Restart clonazepam 2/20   Acute urinary tract infection: Likely acute cystitis.  Urinalysis with contamination Plan: Complete 3-day course of Rocephin (now complete)  Acute influenza A infection.   Completed course of Tamiflu   COPD: Not in any acute exacerbation at this time.   Continue as needed nebulization   Hypothyroidism, GERD: Continue with home medications.   Psoriasis: Chronic.  Patient is on monoclonal antibody injections.  High risk of immunocompromise   Status post laminectomy on 01/23-clinically stable.   PT OT consulted Will need skilled nursing facility Plan for [facility] Insurance authorization initiated   Right foot pain: Seen and recently treated by PCP. Xray per patient was reported to be unremarkable for fracture. Patient was treated for suspected gout flare.LLE Duplex was negative for DVT   DVT prophylaxis: SQ lovenox Code Status: FULL Family Communication:None today Disposition Plan: Status is: Inpatient Remains inpatient appropriate  because: Acute metabolic encephalopathy in the setting of polypharmacy versus UTI.  Frequent falls.  Will need skilled nursing facility.  Medically stable for discharge.   Level of care: Med-Surg  Consultants:  None  Procedures:  None  Antimicrobials:     Subjective: Examined.  No complaints.  Feels well this morning.  Objective: Vitals:   03/28/23 1630 03/28/23 2009 03/29/23 0406 03/29/23 0742  BP: 130/65 (!) 120/42 (!) 117/57 (!) 156/68  Pulse: 76 74 73 86  Resp: 16 16 16 17   Temp: 97.8 F (36.6 C) 98.4 F (36.9 C) 97.9 F (36.6 C) 97.7 F (36.5 C)  TempSrc:  Oral Oral Oral  SpO2: 100% 96% 92% 93%  Weight:      Height:        Intake/Output Summary (Last 24 hours) at 03/29/2023 1126 Last data filed at 03/29/2023 1051 Gross per 24 hour  Intake 340 ml  Output --  Net 340 ml   Filed Weights   03/25/23 1046  Weight: 84.8 kg    Examination:  General exam: No acute distress Respiratory system: Lungs clear.  Normal work of breathing.  Room air Cardiovascular system: S1-S2, RRR, no murmurs, no pedal edema Gastrointestinal system: Soft, NT/ND, normal bowel sounds Central nervous system: Alert.  Oriented x 3.  No focal deficits Extremities: Symmetric 5 x 5 power. Skin: No rashes, lesions or ulcers Psychiatry: Judgement and insight appear normal. Mood & affect flattened.     Data Reviewed: I have personally reviewed following labs and imaging studies  CBC: Recent Labs  Lab 03/25/23 1052 03/26/23 1537 03/27/23 0903  WBC 7.2 3.8* 3.8*  NEUTROABS  --   --  1.2*  HGB 12.0 10.9* 11.2*  HCT  39.0 35.5* 37.2  MCV 94.9 95.7 96.4  PLT 286 212 231   Basic Metabolic Panel: Recent Labs  Lab 03/25/23 1052 03/26/23 1537 03/27/23 0903  NA 141 142 140  K 3.7 3.3* 3.2*  CL 99 104 105  CO2 28 27 27   GLUCOSE 156* 131* 120*  BUN 18 12 10   CREATININE 0.96 0.94 0.87  CALCIUM 9.4 8.4* 8.5*   GFR: Estimated Creatinine Clearance: 58.6 mL/min (by C-G formula based  on SCr of 0.87 mg/dL). Liver Function Tests: Recent Labs  Lab 03/25/23 1052  AST 21  ALT 16  ALKPHOS 86  BILITOT 1.0  PROT 7.0  ALBUMIN 3.7   Recent Labs  Lab 03/25/23 1633  LIPASE 24   Recent Labs  Lab 03/25/23 1633  AMMONIA <10   Coagulation Profile: No results for input(s): "INR", "PROTIME" in the last 168 hours. Cardiac Enzymes: No results for input(s): "CKTOTAL", "CKMB", "CKMBINDEX", "TROPONINI" in the last 168 hours. BNP (last 3 results) No results for input(s): "PROBNP" in the last 8760 hours. HbA1C: No results for input(s): "HGBA1C" in the last 72 hours. CBG: No results for input(s): "GLUCAP" in the last 168 hours. Lipid Profile: No results for input(s): "CHOL", "HDL", "LDLCALC", "TRIG", "CHOLHDL", "LDLDIRECT" in the last 72 hours. Thyroid Function Tests: No results for input(s): "TSH", "T4TOTAL", "FREET4", "T3FREE", "THYROIDAB" in the last 72 hours. Anemia Panel: No results for input(s): "VITAMINB12", "FOLATE", "FERRITIN", "TIBC", "IRON", "RETICCTPCT" in the last 72 hours. Sepsis Labs: Recent Labs  Lab 03/27/23 0903  PROCALCITON <0.10    Recent Results (from the past 240 hours)  Resp panel by RT-PCR (RSV, Flu A&B, Covid) Anterior Nasal Swab     Status: Abnormal   Collection Time: 03/25/23  4:34 PM   Specimen: Anterior Nasal Swab  Result Value Ref Range Status   SARS Coronavirus 2 by RT PCR NEGATIVE NEGATIVE Final    Comment: (NOTE) SARS-CoV-2 target nucleic acids are NOT DETECTED.  The SARS-CoV-2 RNA is generally detectable in upper respiratory specimens during the acute phase of infection. The lowest concentration of SARS-CoV-2 viral copies this assay can detect is 138 copies/mL. A negative result does not preclude SARS-Cov-2 infection and should not be used as the sole basis for treatment or other patient management decisions. A negative result may occur with  improper specimen collection/handling, submission of specimen other than  nasopharyngeal swab, presence of viral mutation(s) within the areas targeted by this assay, and inadequate number of viral copies(<138 copies/mL). A negative result must be combined with clinical observations, patient history, and epidemiological information. The expected result is Negative.  Fact Sheet for Patients:  BloggerCourse.com  Fact Sheet for Healthcare Providers:  SeriousBroker.it  This test is no t yet approved or cleared by the [place] FDA and  has been authorized for detection and/or diagnosis of SARS-CoV-2 by FDA under an Emergency Use Authorization (EUA). This EUA will remain  in effect (meaning this test can be used) for the duration of the COVID-19 declaration under Section 564(b)(1) of the Act, 21 U.S.C.section 360bbb-3(b)(1), unless the authorization is terminated  or revoked sooner.       Influenza A by PCR POSITIVE (A) NEGATIVE Final   Influenza B by PCR NEGATIVE NEGATIVE Final    Comment: (NOTE) The Xpert Xpress SARS-CoV-2/FLU/RSV plus assay is intended as an aid in the diagnosis of influenza from Nasopharyngeal swab specimens and should not be used as a sole basis for treatment. Nasal washings and aspirates are unacceptable for Xpert Xpress SARS-CoV-2/FLU/RSV  testing.  Fact Sheet for Patients: BloggerCourse.com  Fact Sheet for Healthcare Providers: SeriousBroker.it  This test is not yet approved or cleared by the [place] FDA and has been authorized for detection and/or diagnosis of SARS-CoV-2 by FDA under an Emergency Use Authorization (EUA). This EUA will remain in effect (meaning this test can be used) for the duration of the COVID-19 declaration under Section 564(b)(1) of the Act, 21 U.S.C. section 360bbb-3(b)(1), unless the authorization is terminated or revoked.     Resp Syncytial Virus by PCR NEGATIVE NEGATIVE Final    Comment:  (NOTE) Fact Sheet for Patients: BloggerCourse.com  Fact Sheet for Healthcare Providers: SeriousBroker.it  This test is not yet approved or cleared by the [place] FDA and has been authorized for detection and/or diagnosis of SARS-CoV-2 by FDA under an Emergency Use Authorization (EUA). This EUA will remain in effect (meaning this test can be used) for the duration of the COVID-19 declaration under Section 564(b)(1) of the Act, 21 U.S.C. section 360bbb-3(b)(1), unless the authorization is terminated or revoked.  Performed at [facility], [address] [place], [place] [address]   Urine Culture (for pregnant, neutropenic or urologic patients or patients with an indwelling urinary catheter)     Status: Abnormal   Collection Time: 03/25/23  4:34 PM   Specimen: Urine, Random  Result Value Ref Range Status   Specimen Description   Final    URINE, RANDOM Performed at [facility], [address] [place], [place] [address]    Special Requests   Final    NONE Performed at [facility], [address] [place], [place] [address]    Culture MULTIPLE SPECIES PRESENT, SUGGEST RECOLLECTION (A)  Final   Report Status 03/27/2023 FINAL  Final  Culture, blood (Routine X 2) w Reflex to ID Panel     Status: None (Preliminary result)   Collection Time: 03/26/23  9:13 AM   Specimen: BLOOD  Result Value Ref Range Status   Specimen Description BLOOD BLOOD LEFT HAND  Final   Special Requests   Final    BOTTLES DRAWN AEROBIC ONLY Blood Culture results may not be optimal due to an inadequate volume of blood received in culture bottles   Culture   Final    NO GROWTH 3 DAYS Performed at [facility], [address] [place], [place] [address]    Report Status PENDING  Incomplete  Culture, blood (Routine X 2) w Reflex to ID Panel     Status: None (Preliminary result)   Collection Time: 03/26/23  9:18 AM    Specimen: BLOOD  Result Value Ref Range Status   Specimen Description BLOOD BLOOD RIGHT HAND  Final   Special Requests   Final    BOTTLES DRAWN AEROBIC AND ANAEROBIC Blood Culture adequate volume   Culture   Final    NO GROWTH 3 DAYS Performed at [facility], [address] [place], [place] [address]    Report Status PENDING  Incomplete         Radiology Studies: No results found.       Scheduled Meds:  aspirin EC  81 mg Oral Daily   carvedilol  3.125 mg Oral BID WC   citalopram  20 mg Oral Daily   clonazePAM  0.5 mg Oral TID   diltiazem  120 mg Oral Daily   doxepin  50 mg Oral QHS   enoxaparin (LOVENOX) injection  40 mg Subcutaneous Q24H   fenofibrate  160  mg Oral Daily   fluticasone furoate-vilanterol  1 puff Inhalation Daily   levothyroxine  100 mcg Oral Q0600   oseltamivir  30 mg Oral BID   pantoprazole  40 mg Oral QAC supper   polyethylene glycol  17 g Oral Daily   rosuvastatin  20 mg Oral Daily   sodium chloride flush  3 mL Intravenous Q12H   Vitamin D (Ergocalciferol)  50,000 Units Oral Once per day on Monday Thursday   Continuous Infusions:  cefTRIAXone (ROCEPHIN)  IV 1 g (03/28/23 2022)     LOS: 3 days     Tresa Moore, MD [facility]   If 7PM-7AM, please contact night-coverage  03/29/2023, 11:26 AM

## 2023-03-29 NOTE — Progress Notes (Signed)
Occupational Therapy Treatment Patient Details Name: [name] MRN: [id number] DOB: [birth year] Today's Date: 03/29/2023   History of present illness Pt is a 73 year old female brought in by family on account of frequent falls and generalized weakness with altered mentation.  Patient currently being managed for acute metabolic encephalopathy due to UTI, influenza A as well as sedating medication.        PMH significant for hypertension, hypothyroidism, COPD, GERD, arthritis, psoriasis on monoclonal antibody injections, anxiety disorder, peripheral vascular disease, status post recent laminectomy on 01/23   OT comments  Pt seen for OT tx. Pt pleasant, agreeable, and able to follow simple commands this date. Pt required set up and increased time/effort to don socks seated EOB using modified figure 4 technique. Pt continues to require increased assist for mobility and ADL.       If plan is discharge home, recommend the following:  A little help with walking and/or transfers;Assist for transportation;Assistance with cooking/housework;A little help with bathing/dressing/bathroom   Equipment Recommendations  Other (comment) (defer)    Recommendations for Other Services      Precautions / Restrictions Precautions Precautions: Fall Precaution Booklet Issued: No Precaution/Restrictions Comments: status post recent laminectomy on 01/23 Restrictions Weight Bearing Restrictions Per Provider Order: No       Mobility Bed Mobility Overal bed mobility: Modified Independent Bed Mobility: Supine to Sit                Transfers Overall transfer level: Needs assistance Equipment used: Rolling walker (2 wheels) Transfers: Sit to/from Stand, Bed to chair/wheelchair/BSC Sit to Stand: Contact guard assist     Step pivot transfers: Contact guard assist           Balance Overall balance assessment: Needs assistance Sitting-balance support: Feet supported Sitting balance-Leahy Scale:  Fair     Standing balance support: Bilateral upper extremity supported, During functional activity, Reliant on assistive device for balance Standing balance-Leahy Scale: Fair                             ADL either performed or assessed with clinical judgement   ADL Overall ADL's : Needs assistance/impaired                     Lower Body Dressing: Sit to/from stand;Minimal assistance Lower Body Dressing Details (indicate cue type and reason): Pt able to don socks using modified figure 4 technique seated EOB - no direct assist required but did need increased time/effort to complete                    Extremity/Trunk Assessment              Vision       Perception     Praxis     Communication     Cognition Arousal: Alert Behavior During Therapy: WFL for tasks assessed/performed Cognition: No apparent impairments                               Following commands: Intact        Cueing   Cueing Techniques: Verbal cues  Exercises Other Exercises Other Exercises: Pt edu in home/routines modifications, falls prevention, AE/DME To improve safety/indep with ADL/IADL    Shoulder Instructions       General Comments VSS on RA    Pertinent Vitals/ Pain  Pain Assessment Pain Assessment: No/denies pain  Home Living                                          Prior Functioning/Environment              Frequency  Min 1X/week        Progress Toward Goals  OT Goals(current goals can now be found in the care plan section)  Progress towards OT goals: Progressing toward goals  Acute Rehab OT Goals Patient Stated Goal: go home OT Goal Formulation: With patient/family Time For Goal Achievement: 04/10/23 Potential to Achieve Goals: Fair  Plan      Co-evaluation                 AM-PAC OT "6 Clicks" Daily Activity     Outcome Measure   Help from another person eating meals?: None Help  from another person taking care of personal grooming?: None Help from another person toileting, which includes using toliet, bedpan, or urinal?: A Lot Help from another person bathing (including washing, rinsing, drying)?: A Lot Help from another person to put on and taking off regular upper body clothing?: A Little Help from another person to put on and taking off regular lower body clothing?: A Little 6 Click Score: 18    End of Session Equipment Utilized During Treatment: Rolling walker (2 wheels)  OT Visit Diagnosis: Unsteadiness on feet (R26.81);History of falling (Z91.81);Other abnormalities of gait and mobility (R26.89);Repeated falls (R29.6)   Activity Tolerance Patient tolerated treatment well   Patient Left in chair;with call bell/phone within reach   Nurse Communication          Time: 6578-4696 OT Time Calculation (min): 19 min  Charges: OT General Charges $OT Visit: 1 Visit OT Treatments $Self Care/Home Management : 8-22 mins  Arman Filter., MPH, MS, OTR/L ascom [phone] 03/29/23, 11:15 AM

## 2023-03-29 NOTE — TOC Transition Note (Signed)
Transition of Care [facility]) - Discharge Note   Patient Details  Name: [name] MRN: [id number] Date of Birth: [birth year]  [facility] CM/SW Contact:  Chapman Fitch, RN Phone Number: 03/29/2023, 3:12 PM   Clinical Narrative:     [name] received from [facility] for [facility]  Patient will DC to: [facility] Anticipated DC date: .03/29/23  Family notified: son at bedside Transport by: son   Per MD patient ready for DC to . RN, patient, patient's family, and facility notified of DC. Discharge Summary sent to facility. RN given number for report. DC packet on chart. TOC signing off.         Patient Goals and CMS Choice            Discharge Placement                       Discharge Plan and Services Additional resources added to the After Visit Summary for                                       Social Drivers of Health (SDOH) Interventions SDOH Screenings   Food Insecurity: No Food Insecurity (03/26/2023)  Housing: Low Risk  (03/26/2023)  Transportation Needs: No Transportation Needs (03/26/2023)  Utilities: Not At Risk (03/26/2023)  Alcohol Screen: Low Risk  (08/28/2022)  Depression (PHQ2-9): Low Risk  (02/08/2023)  Financial Resource Strain: Low Risk  (04/27/2022)  Physical Activity: Inactive (04/27/2022)  Social Connections: Moderately Isolated (03/26/2023)  Stress: No Stress Concern Present (04/27/2022)  Tobacco Use: Medium Risk (03/26/2023)     Readmission Risk Interventions    01/05/2023   12:33 PM  Readmission Risk Prevention Plan  Transportation Screening Complete  PCP or Specialist Appt within 3-5 Days Complete  HRI or Home Care Consult Complete  Social Work Consult for Recovery Care Planning/Counseling Complete  Palliative Care Screening Not Applicable  Medication Review [employer]) Complete

## 2023-03-29 NOTE — Plan of Care (Signed)

## 2023-03-31 LAB — CULTURE, BLOOD (ROUTINE X 2)
Culture: NO GROWTH
Culture: NO GROWTH
Special Requests: ADEQUATE

## 2023-04-01 DIAGNOSIS — I129 Hypertensive chronic kidney disease with stage 1 through stage 4 chronic kidney disease, or unspecified chronic kidney disease: Secondary | ICD-10-CM | POA: Diagnosis not present

## 2023-04-01 DIAGNOSIS — K219 Gastro-esophageal reflux disease without esophagitis: Secondary | ICD-10-CM | POA: Diagnosis not present

## 2023-04-01 DIAGNOSIS — E039 Hypothyroidism, unspecified: Secondary | ICD-10-CM | POA: Diagnosis not present

## 2023-04-01 DIAGNOSIS — J449 Chronic obstructive pulmonary disease, unspecified: Secondary | ICD-10-CM | POA: Diagnosis not present

## 2023-04-01 DIAGNOSIS — J101 Influenza due to other identified influenza virus with other respiratory manifestations: Secondary | ICD-10-CM | POA: Diagnosis not present

## 2023-04-01 DIAGNOSIS — N390 Urinary tract infection, site not specified: Secondary | ICD-10-CM | POA: Diagnosis not present

## 2023-04-01 DIAGNOSIS — N183 Chronic kidney disease, stage 3 unspecified: Secondary | ICD-10-CM | POA: Diagnosis not present

## 2023-04-01 DIAGNOSIS — E7800 Pure hypercholesterolemia, unspecified: Secondary | ICD-10-CM | POA: Diagnosis not present

## 2023-04-01 DIAGNOSIS — G2581 Restless legs syndrome: Secondary | ICD-10-CM | POA: Diagnosis not present

## 2023-04-01 DIAGNOSIS — L409 Psoriasis, unspecified: Secondary | ICD-10-CM | POA: Diagnosis not present

## 2023-04-01 DIAGNOSIS — M79671 Pain in right foot: Secondary | ICD-10-CM | POA: Diagnosis not present

## 2023-04-01 DIAGNOSIS — G9341 Metabolic encephalopathy: Secondary | ICD-10-CM | POA: Diagnosis not present

## 2023-04-03 DIAGNOSIS — N183 Chronic kidney disease, stage 3 unspecified: Secondary | ICD-10-CM | POA: Diagnosis not present

## 2023-04-03 DIAGNOSIS — J449 Chronic obstructive pulmonary disease, unspecified: Secondary | ICD-10-CM | POA: Diagnosis not present

## 2023-04-03 DIAGNOSIS — N390 Urinary tract infection, site not specified: Secondary | ICD-10-CM | POA: Diagnosis not present

## 2023-04-03 DIAGNOSIS — M79671 Pain in right foot: Secondary | ICD-10-CM | POA: Diagnosis not present

## 2023-04-03 DIAGNOSIS — G2581 Restless legs syndrome: Secondary | ICD-10-CM | POA: Diagnosis not present

## 2023-04-03 DIAGNOSIS — J101 Influenza due to other identified influenza virus with other respiratory manifestations: Secondary | ICD-10-CM | POA: Diagnosis not present

## 2023-04-03 DIAGNOSIS — I129 Hypertensive chronic kidney disease with stage 1 through stage 4 chronic kidney disease, or unspecified chronic kidney disease: Secondary | ICD-10-CM | POA: Diagnosis not present

## 2023-04-03 DIAGNOSIS — K219 Gastro-esophageal reflux disease without esophagitis: Secondary | ICD-10-CM | POA: Diagnosis not present

## 2023-04-03 DIAGNOSIS — R1310 Dysphagia, unspecified: Secondary | ICD-10-CM | POA: Diagnosis not present

## 2023-04-03 DIAGNOSIS — E039 Hypothyroidism, unspecified: Secondary | ICD-10-CM | POA: Diagnosis not present

## 2023-04-04 DIAGNOSIS — M79671 Pain in right foot: Secondary | ICD-10-CM | POA: Diagnosis not present

## 2023-04-04 DIAGNOSIS — J101 Influenza due to other identified influenza virus with other respiratory manifestations: Secondary | ICD-10-CM | POA: Diagnosis not present

## 2023-04-04 DIAGNOSIS — E039 Hypothyroidism, unspecified: Secondary | ICD-10-CM | POA: Diagnosis not present

## 2023-04-04 DIAGNOSIS — G2581 Restless legs syndrome: Secondary | ICD-10-CM | POA: Diagnosis not present

## 2023-04-04 DIAGNOSIS — I129 Hypertensive chronic kidney disease with stage 1 through stage 4 chronic kidney disease, or unspecified chronic kidney disease: Secondary | ICD-10-CM | POA: Diagnosis not present

## 2023-04-04 DIAGNOSIS — R1310 Dysphagia, unspecified: Secondary | ICD-10-CM | POA: Diagnosis not present

## 2023-04-04 DIAGNOSIS — N390 Urinary tract infection, site not specified: Secondary | ICD-10-CM | POA: Diagnosis not present

## 2023-04-04 DIAGNOSIS — K219 Gastro-esophageal reflux disease without esophagitis: Secondary | ICD-10-CM | POA: Diagnosis not present

## 2023-04-04 DIAGNOSIS — N183 Chronic kidney disease, stage 3 unspecified: Secondary | ICD-10-CM | POA: Diagnosis not present

## 2023-04-04 DIAGNOSIS — G9341 Metabolic encephalopathy: Secondary | ICD-10-CM | POA: Diagnosis not present

## 2023-04-04 DIAGNOSIS — F330 Major depressive disorder, recurrent, mild: Secondary | ICD-10-CM | POA: Diagnosis not present

## 2023-04-04 DIAGNOSIS — J449 Chronic obstructive pulmonary disease, unspecified: Secondary | ICD-10-CM | POA: Diagnosis not present

## 2023-04-05 DIAGNOSIS — M79672 Pain in left foot: Secondary | ICD-10-CM | POA: Diagnosis not present

## 2023-04-05 DIAGNOSIS — G2581 Restless legs syndrome: Secondary | ICD-10-CM | POA: Diagnosis not present

## 2023-04-05 DIAGNOSIS — I129 Hypertensive chronic kidney disease with stage 1 through stage 4 chronic kidney disease, or unspecified chronic kidney disease: Secondary | ICD-10-CM | POA: Diagnosis not present

## 2023-04-05 DIAGNOSIS — J101 Influenza due to other identified influenza virus with other respiratory manifestations: Secondary | ICD-10-CM | POA: Diagnosis not present

## 2023-04-05 DIAGNOSIS — R1310 Dysphagia, unspecified: Secondary | ICD-10-CM | POA: Diagnosis not present

## 2023-04-05 DIAGNOSIS — N390 Urinary tract infection, site not specified: Secondary | ICD-10-CM | POA: Diagnosis not present

## 2023-04-05 DIAGNOSIS — J449 Chronic obstructive pulmonary disease, unspecified: Secondary | ICD-10-CM | POA: Diagnosis not present

## 2023-04-05 DIAGNOSIS — K219 Gastro-esophageal reflux disease without esophagitis: Secondary | ICD-10-CM | POA: Diagnosis not present

## 2023-04-05 DIAGNOSIS — N183 Chronic kidney disease, stage 3 unspecified: Secondary | ICD-10-CM | POA: Diagnosis not present

## 2023-04-05 DIAGNOSIS — L409 Psoriasis, unspecified: Secondary | ICD-10-CM | POA: Diagnosis not present

## 2023-04-08 DIAGNOSIS — M79671 Pain in right foot: Secondary | ICD-10-CM | POA: Diagnosis not present

## 2023-04-08 DIAGNOSIS — F330 Major depressive disorder, recurrent, mild: Secondary | ICD-10-CM | POA: Diagnosis not present

## 2023-04-08 DIAGNOSIS — I129 Hypertensive chronic kidney disease with stage 1 through stage 4 chronic kidney disease, or unspecified chronic kidney disease: Secondary | ICD-10-CM | POA: Diagnosis not present

## 2023-04-08 DIAGNOSIS — L409 Psoriasis, unspecified: Secondary | ICD-10-CM | POA: Diagnosis not present

## 2023-04-08 DIAGNOSIS — G9341 Metabolic encephalopathy: Secondary | ICD-10-CM | POA: Diagnosis not present

## 2023-04-08 DIAGNOSIS — K219 Gastro-esophageal reflux disease without esophagitis: Secondary | ICD-10-CM | POA: Diagnosis not present

## 2023-04-08 DIAGNOSIS — N183 Chronic kidney disease, stage 3 unspecified: Secondary | ICD-10-CM | POA: Diagnosis not present

## 2023-04-08 DIAGNOSIS — M79672 Pain in left foot: Secondary | ICD-10-CM | POA: Diagnosis not present

## 2023-04-08 DIAGNOSIS — G2581 Restless legs syndrome: Secondary | ICD-10-CM | POA: Diagnosis not present

## 2023-04-08 DIAGNOSIS — N390 Urinary tract infection, site not specified: Secondary | ICD-10-CM | POA: Diagnosis not present

## 2023-04-08 DIAGNOSIS — J449 Chronic obstructive pulmonary disease, unspecified: Secondary | ICD-10-CM | POA: Diagnosis not present

## 2023-04-08 DIAGNOSIS — J101 Influenza due to other identified influenza virus with other respiratory manifestations: Secondary | ICD-10-CM | POA: Diagnosis not present

## 2023-04-09 ENCOUNTER — Ambulatory Visit: Admitting: Internal Medicine

## 2023-04-09 ENCOUNTER — Telehealth: Payer: Self-pay

## 2023-04-09 NOTE — Telephone Encounter (Signed)
 Will discuss at upcoming appointment

## 2023-04-09 NOTE — Transitions of Care (Post Inpatient/ED Visit) (Signed)
 04/09/2023  Name: [name] MRN: [id number] DOB: Feb 06, 1950  Today's TOC FU Call Status: Today's TOC FU Call Status:: Successful TOC FU Call Completed TOC FU Call Complete Date: 04/09/23 Patient's Name and Date of Birth confirmed.  Transition Care Management Follow-up Telephone Call Date of Discharge: 04/08/23 Discharge Facility: Other ([facility]) Name of Other ([facility]) Discharge Facility: [facility] Type of Discharge: Inpatient Admission Primary Inpatient Discharge Diagnosis:: encephalopathy, UTI How have you been since you were released from the hospital?: Better Any questions or concerns?: No  Items Reviewed: Did you receive and understand the discharge instructions provided?: Yes Medications obtained,verified, and reconciled?: Yes (Medications Reviewed) Any new allergies since your discharge?: No Dietary orders reviewed?: Yes Do you have support at home?: No  Medications Reviewed Today: Medications Reviewed Today     Reviewed by Karena Addison, LPN (Licensed Practical Nurse) on 04/09/23 at 1115  Med List Status: <None>   Medication Order Taking? Sig Documenting Provider Last Dose Status Informant  acetaminophen (TYLENOL) 500 MG tablet [id number] No Take 500 mg by mouth every 6 (six) hours as needed for moderate pain.  [provider] Taking Active Self, Pharmacy Records, Multiple Informants  albuterol (VENTOLIN HFA) 108 (90 Base) MCG/ACT inhaler [id number] No INHALE 2 PUFFS BY MOUTH EVERY 6 HOURS AS NEEDED FOR WHEEZING OR SHORTNESS OF Barbaraann Barthel, DO Taking Active Self, Pharmacy Records, Multiple Informants           Med Note Clarene Reamer, MARIA   Tue Mar 26, 2023 10:56 AM) prn  aspirin EC (ASPIRIN LOW DOSE) 81 MG tablet [id number] No TAKE 1 TABLET(81 MG) BY MOUTH DAILY. SWALLOW WHOLE Baity, Salvadore Oxford, NP Past Week Active Self, Pharmacy Records, Multiple Informants  benzonatate (TESSALON) 200 MG capsule [id number]  Take 1 capsule (200 mg total) by mouth 3  (three) times daily as needed for cough. Tresa Moore, MD  Active   carvedilol (COREG) 3.125 MG tablet [id number] No TAKE 1 TABLET BY MOUTH TWICE DAILY WITH A MEAL Baity, Salvadore Oxford, NP Past Week Active   citalopram (CELEXA) 20 MG tablet [id number]  Take 1 tablet (20 mg total) by mouth daily. Tresa Moore, MD  Active   clonazePAM (KLONOPIN) 1 MG tablet [id number]  TAKE 1 TABLET BY MOUTH EVERY MORNING, 1/2 TABLET EVERY AFTERNOON AND 1 TABLET EVERY NIGHT AT BEDTIME.  SNF use only Georgeann Oppenheim, Sudheer B, MD  Active   cyclobenzaprine (FLEXERIL) 10 MG tablet [id number] No Take 1 tablet (10 mg total) by mouth 3 (three) times daily as needed for muscle spasms. Susanne Borders, PA Taking Active   diltiazem [facility] CD) 120 MG 24 hr capsule [id number] No TAKE 1 CAPSULE BY MOUTH EVERY DAY Baity, Salvadore Oxford, NP Past Week Active   doxepin (SINEQUAN) 25 MG capsule [id number] No Take 50 mg by mouth at bedtime. [provider] Past Week Active Self, Pharmacy Records, Multiple Informants  fenofibrate (TRICOR) 145 MG tablet [id number] No Take 1 tablet (145 mg total) by mouth daily. Lorre Munroe, NP Past Week Active   hydrOXYzine (ATARAX) 25 MG tablet [id number] No Take 25 mg by mouth at bedtime. [provider] Taking Active Self, Pharmacy Records, Multiple Informants           Med Note Clarene Reamer, MARIA   Tue Mar 26, 2023 11:16 AM) prn  ipratropium-albuterol (DUONEB) 0.5-2.5 (3) MG/3ML SOLN [id number] No Take 3 mLs by nebulization every 6 (six) hours as needed (wheezing / shortness of breath). Sunnie Nielsen, DO Taking Active  Self, Pharmacy Records, Multiple Informants           Med Note Clarene Reamer, MARIA   Tue Mar 26, 2023 11:10 AM) prn  levothyroxine (SYNTHROID) 100 MCG tablet [id number] No Take 1 tablet (100 mcg total) by mouth daily. Lorre Munroe, NP Past Week Active Self, Pharmacy Records, Multiple Informants  lisinopril (ZESTRIL) 10 MG tablet [id number] No TAKE 1 TABLET(10 MG) BY MOUTH  DAILY Baity, Salvadore Oxford, NP Past Week Active   Misc. Devices (QUAD CANE/SMALL BASE) MISC [id number] No 1 Device by Does not apply route daily. Galen Manila, NP Taking Active Self, Pharmacy Records, Multiple Informants  pantoprazole (PROTONIX) 40 MG tablet [id number] No SMARTSIG:1 Tablet(s) By Mouth Every Evening [provider] Taking Active Self, Pharmacy Records, Multiple Informants           Med Note Clarene Reamer, MARIA   Tue Mar 26, 2023 11:03 AM) prn  rosuvastatin (CRESTOR) 20 MG tablet [id number] No Take 1 tablet (20 mg total) by mouth daily. Lorre Munroe, NP Past Week Active Self, Pharmacy Records, Multiple Informants  senna (SENOKOT) 8.6 MG TABS tablet [id number] No Take 1 tablet (8.6 mg total) by mouth 2 (two) times daily as needed for mild constipation. Susanne Borders, PA Taking Active   triamcinolone ointment (KENALOG) 0.1 % [id number]  Apply 1 Application topically as needed. Pharmacy, MD  Active            Med Note Clarene Reamer, MARIA   Tue Mar 26, 2023 11:05 AM) prn            Home Care and Equipment/Supplies: Were Home Health Services Ordered?: Yes Name of Home Health Agency:: unknown Has Agency set up a time to come to your home?: No Any new equipment or medical supplies ordered?: NA  Functional Questionnaire: Do you need assistance with bathing/showering or dressing?: No Do you need assistance with meal preparation?: No Do you need assistance with eating?: No Do you have difficulty maintaining continence: No Do you need assistance with getting out of bed/getting out of a chair/moving?: No Do you have difficulty managing or taking your medications?: No  Follow up appointments reviewed: PCP Follow-up appointment confirmed?: Yes Date of PCP follow-up appointment?: 04/12/23 Follow-up Provider: [facility] Follow-up appointment confirmed?: NA Do you need transportation to your follow-up appointment?: No Do you understand care options if your  condition(s) worsen?: Yes-patient verbalized understanding    SIGNATURE Karena Addison, LPN [facility] Nurse Health Advisor Direct Dial [phone]

## 2023-04-10 ENCOUNTER — Encounter: Payer: PPO | Admitting: Neurosurgery

## 2023-04-12 ENCOUNTER — Ambulatory Visit: Admitting: Internal Medicine

## 2023-04-15 ENCOUNTER — Telehealth: Payer: Self-pay

## 2023-04-15 ENCOUNTER — Encounter: Admitting: Neurosurgery

## 2023-04-15 DIAGNOSIS — Z122 Encounter for screening for malignant neoplasm of respiratory organs: Secondary | ICD-10-CM

## 2023-04-15 DIAGNOSIS — Z87891 Personal history of nicotine dependence: Secondary | ICD-10-CM

## 2023-04-15 NOTE — Telephone Encounter (Signed)
.  Lung Cancer Screening Narrative/Criteria Questionnaire (Cigarette Smokers Only- No Cigars/Pipes/vapes)   [name]   SDMV:05/01/2023 at 1:00 pm with [facility]        October 25, 1950   LDCT: 05/03/2023 at 2:00 pm at [facility]    73 y.o.   Phone: [phone]  Lung Screening Narrative (confirm age 52-77 yrs Medicare / 50-80 yrs Private pay insurance)   Insurance information:HTA   Referring Provider:Baity, NP   This screening involves an initial phone call with a team member from our program. It is called a shared decision making visit. The initial meeting is required by  insurance and Medicare to make sure you understand the program. This appointment takes about 15-20 minutes to complete. You will complete the screening scan at your scheduled date/time.  This scan takes about 5-10 minutes to complete. You can eat and drink normally before and after the scan.  Criteria questions for Lung Cancer Screening:   Are you a current or former smoker? Former Age began smoking: 14   If you are a former smoker, what year did you quit smoking? Quit 2024 (within 15 yrs)   To calculate your smoking history, I need an accurate estimate of how many packs of cigarettes you smoked per day and for how many years. (Not just the number of PPD you are now smoking)   Years smoking 58 x Packs per day 2 = Pack years 116   (at least 20 pack yrs)   (Make sure they understand that we need to know how much they have smoked in the past, not just the number of PPD they are smoking now)  Do you have a personal history of cancer?  No    Do you have a family history of cancer? No  Are you coughing up blood?  No  Have you had unexplained weight loss of 15 lbs or more in the last 6 months? No  It looks like you meet all criteria.  When would be a good time for [name] to schedule you for this screening?   Additional information: N/A

## 2023-04-17 ENCOUNTER — Inpatient Hospital Stay
Admission: EM | Admit: 2023-04-17 | Discharge: 2023-04-23 | DRG: 189 | Disposition: A | Discharge status: Home Health | Attending: Student | Admitting: Student

## 2023-04-17 ENCOUNTER — Encounter: Payer: Self-pay | Admitting: Emergency Medicine

## 2023-04-17 ENCOUNTER — Other Ambulatory Visit: Payer: Self-pay

## 2023-04-17 ENCOUNTER — Ambulatory Visit: Admitting: Internal Medicine

## 2023-04-17 ENCOUNTER — Emergency Department

## 2023-04-17 DIAGNOSIS — E876 Hypokalemia: Secondary | ICD-10-CM | POA: Diagnosis not present

## 2023-04-17 DIAGNOSIS — L409 Psoriasis, unspecified: Secondary | ICD-10-CM | POA: Diagnosis not present

## 2023-04-17 DIAGNOSIS — E669 Obesity, unspecified: Secondary | ICD-10-CM | POA: Diagnosis not present

## 2023-04-17 DIAGNOSIS — R600 Localized edema: Secondary | ICD-10-CM | POA: Diagnosis not present

## 2023-04-17 DIAGNOSIS — I951 Orthostatic hypotension: Secondary | ICD-10-CM | POA: Diagnosis present

## 2023-04-17 DIAGNOSIS — T502X5A Adverse effect of carbonic-anhydrase inhibitors, benzothiadiazides and other diuretics, initial encounter: Secondary | ICD-10-CM | POA: Diagnosis not present

## 2023-04-17 DIAGNOSIS — E1169 Type 2 diabetes mellitus with other specified complication: Secondary | ICD-10-CM | POA: Diagnosis present

## 2023-04-17 DIAGNOSIS — F0394 Unspecified dementia, unspecified severity, with anxiety: Secondary | ICD-10-CM | POA: Diagnosis present

## 2023-04-17 DIAGNOSIS — S79912A Unspecified injury of left hip, initial encounter: Secondary | ICD-10-CM | POA: Diagnosis present

## 2023-04-17 DIAGNOSIS — Z79899 Other long term (current) drug therapy: Secondary | ICD-10-CM

## 2023-04-17 DIAGNOSIS — J121 Respiratory syncytial virus pneumonia: Secondary | ICD-10-CM | POA: Diagnosis not present

## 2023-04-17 DIAGNOSIS — E1151 Type 2 diabetes mellitus with diabetic peripheral angiopathy without gangrene: Secondary | ICD-10-CM | POA: Diagnosis not present

## 2023-04-17 DIAGNOSIS — E1165 Type 2 diabetes mellitus with hyperglycemia: Secondary | ICD-10-CM | POA: Diagnosis not present

## 2023-04-17 DIAGNOSIS — E039 Hypothyroidism, unspecified: Secondary | ICD-10-CM | POA: Diagnosis present

## 2023-04-17 DIAGNOSIS — Z1152 Encounter for screening for COVID-19: Secondary | ICD-10-CM

## 2023-04-17 DIAGNOSIS — Z90710 Acquired absence of both cervix and uterus: Secondary | ICD-10-CM

## 2023-04-17 DIAGNOSIS — D649 Anemia, unspecified: Secondary | ICD-10-CM | POA: Diagnosis not present

## 2023-04-17 DIAGNOSIS — M25572 Pain in left ankle and joints of left foot: Secondary | ICD-10-CM | POA: Diagnosis not present

## 2023-04-17 DIAGNOSIS — Z79890 Hormone replacement therapy (postmenopausal): Secondary | ICD-10-CM

## 2023-04-17 DIAGNOSIS — T380X5A Adverse effect of glucocorticoids and synthetic analogues, initial encounter: Secondary | ICD-10-CM | POA: Diagnosis not present

## 2023-04-17 DIAGNOSIS — R296 Repeated falls: Secondary | ICD-10-CM | POA: Diagnosis not present

## 2023-04-17 DIAGNOSIS — I10 Essential (primary) hypertension: Secondary | ICD-10-CM | POA: Diagnosis not present

## 2023-04-17 DIAGNOSIS — Z79620 Long term (current) use of immunosuppressive biologic: Secondary | ICD-10-CM

## 2023-04-17 DIAGNOSIS — E782 Mixed hyperlipidemia: Secondary | ICD-10-CM | POA: Diagnosis present

## 2023-04-17 DIAGNOSIS — M160 Bilateral primary osteoarthritis of hip: Secondary | ICD-10-CM | POA: Diagnosis not present

## 2023-04-17 DIAGNOSIS — Z881 Allergy status to other antibiotic agents status: Secondary | ICD-10-CM

## 2023-04-17 DIAGNOSIS — R531 Weakness: Secondary | ICD-10-CM | POA: Diagnosis not present

## 2023-04-17 DIAGNOSIS — Z841 Family history of disorders of kidney and ureter: Secondary | ICD-10-CM

## 2023-04-17 DIAGNOSIS — M549 Dorsalgia, unspecified: Secondary | ICD-10-CM | POA: Diagnosis present

## 2023-04-17 DIAGNOSIS — Z823 Family history of stroke: Secondary | ICD-10-CM

## 2023-04-17 DIAGNOSIS — I5031 Acute diastolic (congestive) heart failure: Secondary | ICD-10-CM | POA: Diagnosis not present

## 2023-04-17 DIAGNOSIS — E119 Type 2 diabetes mellitus without complications: Secondary | ICD-10-CM

## 2023-04-17 DIAGNOSIS — B974 Respiratory syncytial virus as the cause of diseases classified elsewhere: Secondary | ICD-10-CM | POA: Diagnosis present

## 2023-04-17 DIAGNOSIS — G9340 Encephalopathy, unspecified: Secondary | ICD-10-CM | POA: Insufficient documentation

## 2023-04-17 DIAGNOSIS — K219 Gastro-esophageal reflux disease without esophagitis: Secondary | ICD-10-CM | POA: Diagnosis present

## 2023-04-17 DIAGNOSIS — Z825 Family history of asthma and other chronic lower respiratory diseases: Secondary | ICD-10-CM

## 2023-04-17 DIAGNOSIS — G8929 Other chronic pain: Secondary | ICD-10-CM | POA: Diagnosis present

## 2023-04-17 DIAGNOSIS — F0393 Unspecified dementia, unspecified severity, with mood disturbance: Secondary | ICD-10-CM | POA: Diagnosis present

## 2023-04-17 DIAGNOSIS — Z885 Allergy status to narcotic agent status: Secondary | ICD-10-CM

## 2023-04-17 DIAGNOSIS — I959 Hypotension, unspecified: Secondary | ICD-10-CM | POA: Diagnosis not present

## 2023-04-17 DIAGNOSIS — Z882 Allergy status to sulfonamides status: Secondary | ICD-10-CM

## 2023-04-17 DIAGNOSIS — Z8701 Personal history of pneumonia (recurrent): Secondary | ICD-10-CM

## 2023-04-17 DIAGNOSIS — G9341 Metabolic encephalopathy: Secondary | ICD-10-CM | POA: Diagnosis not present

## 2023-04-17 DIAGNOSIS — Z9181 History of falling: Secondary | ICD-10-CM

## 2023-04-17 DIAGNOSIS — F32A Depression, unspecified: Secondary | ICD-10-CM | POA: Diagnosis present

## 2023-04-17 DIAGNOSIS — J9601 Acute respiratory failure with hypoxia: Secondary | ICD-10-CM | POA: Diagnosis not present

## 2023-04-17 DIAGNOSIS — Z888 Allergy status to other drugs, medicaments and biological substances status: Secondary | ICD-10-CM

## 2023-04-17 DIAGNOSIS — Z6832 Body mass index (BMI) 32.0-32.9, adult: Secondary | ICD-10-CM

## 2023-04-17 DIAGNOSIS — J9610 Chronic respiratory failure, unspecified whether with hypoxia or hypercapnia: Secondary | ICD-10-CM | POA: Diagnosis not present

## 2023-04-17 DIAGNOSIS — J441 Chronic obstructive pulmonary disease with (acute) exacerbation: Secondary | ICD-10-CM | POA: Diagnosis not present

## 2023-04-17 DIAGNOSIS — Z803 Family history of malignant neoplasm of breast: Secondary | ICD-10-CM

## 2023-04-17 DIAGNOSIS — W19XXXA Unspecified fall, initial encounter: Secondary | ICD-10-CM | POA: Diagnosis present

## 2023-04-17 DIAGNOSIS — Y92009 Unspecified place in unspecified non-institutional (private) residence as the place of occurrence of the external cause: Secondary | ICD-10-CM

## 2023-04-17 DIAGNOSIS — Z87891 Personal history of nicotine dependence: Secondary | ICD-10-CM

## 2023-04-17 DIAGNOSIS — Z8249 Family history of ischemic heart disease and other diseases of the circulatory system: Secondary | ICD-10-CM

## 2023-04-17 DIAGNOSIS — M25551 Pain in right hip: Secondary | ICD-10-CM | POA: Diagnosis present

## 2023-04-17 DIAGNOSIS — F410 Panic disorder [episodic paroxysmal anxiety] without agoraphobia: Secondary | ICD-10-CM

## 2023-04-17 DIAGNOSIS — J449 Chronic obstructive pulmonary disease, unspecified: Secondary | ICD-10-CM | POA: Diagnosis not present

## 2023-04-17 DIAGNOSIS — Z602 Problems related to living alone: Secondary | ICD-10-CM | POA: Diagnosis present

## 2023-04-17 LAB — RESP PANEL BY RT-PCR (RSV, FLU A&B, COVID)  RVPGX2
Influenza A by PCR: NEGATIVE
Influenza B by PCR: NEGATIVE
Resp Syncytial Virus by PCR: POSITIVE — AB
SARS Coronavirus 2 by RT PCR: NEGATIVE

## 2023-04-17 LAB — COMPREHENSIVE METABOLIC PANEL
ALT: 19 U/L (ref 0–44)
AST: 23 U/L (ref 15–41)
Albumin: 3.5 g/dL (ref 3.5–5.0)
Alkaline Phosphatase: 65 U/L (ref 38–126)
Anion gap: 11 (ref 5–15)
BUN: 24 mg/dL — ABNORMAL HIGH (ref 8–23)
CO2: 28 mmol/L (ref 22–32)
Calcium: 9.4 mg/dL (ref 8.9–10.3)
Chloride: 101 mmol/L (ref 98–111)
Creatinine, Ser: 1.25 mg/dL — ABNORMAL HIGH (ref 0.44–1.00)
GFR, Estimated: 45 mL/min — ABNORMAL LOW (ref >60)
Glucose, Bld: 114 mg/dL — ABNORMAL HIGH (ref 70–99)
Potassium: 3.9 mmol/L (ref 3.5–5.1)
Sodium: 140 mmol/L (ref 135–145)
Total Bilirubin: 0.9 mg/dL (ref 0.0–1.2)
Total Protein: 6.7 g/dL (ref 6.5–8.1)

## 2023-04-17 LAB — CBC WITH DIFFERENTIAL/PLATELET
Abs Immature Granulocytes: 0.01 10*3/uL (ref 0.00–0.07)
Basophils Absolute: 0.1 10*3/uL (ref 0.0–0.1)
Basophils Relative: 1 %
Eosinophils Absolute: 0.3 10*3/uL (ref 0.0–0.5)
Eosinophils Relative: 4 %
HCT: 36.5 % (ref 36.0–46.0)
Hemoglobin: 11.1 g/dL — ABNORMAL LOW (ref 12.0–15.0)
Immature Granulocytes: 0 %
Lymphocytes Relative: 34 %
Lymphs Abs: 2.5 10*3/uL (ref 0.7–4.0)
MCH: 29 pg (ref 26.0–34.0)
MCHC: 30.4 g/dL (ref 30.0–36.0)
MCV: 95.3 fL (ref 80.0–100.0)
Monocytes Absolute: 1 10*3/uL (ref 0.1–1.0)
Monocytes Relative: 13 %
Neutro Abs: 3.6 10*3/uL (ref 1.7–7.7)
Neutrophils Relative %: 48 %
Platelets: 276 10*3/uL (ref 150–400)
RBC: 3.83 MIL/uL — ABNORMAL LOW (ref 3.87–5.11)
RDW: 14.4 % (ref 11.5–15.5)
WBC: 7.4 10*3/uL (ref 4.0–10.5)
nRBC: 0 % (ref 0.0–0.2)

## 2023-04-17 LAB — CBG MONITORING, ED
Glucose-Capillary: 99 mg/dL (ref 70–99)
Glucose-Capillary: 174 mg/dL — ABNORMAL HIGH (ref 70–99)

## 2023-04-17 MED ORDER — ENOXAPARIN SODIUM 40 MG/0.4ML IJ SOSY
40.0000 mg | PREFILLED_SYRINGE | INTRAMUSCULAR | Status: DC
  Administered 2023-04-17 – 2023-04-22 (×6): 40 mg via SUBCUTANEOUS
  Filled 2023-04-17 (×7): qty 0.4

## 2023-04-17 MED ORDER — BENZONATATE 100 MG PO CAPS: 200.0000 mg | ORAL_CAPSULE | Freq: Three times a day (TID) | ORAL | Status: DC | PRN

## 2023-04-17 MED ORDER — ONDANSETRON HCL 4 MG/2ML IJ SOLN
4.0000 mg | Freq: Four times a day (QID) | INTRAMUSCULAR | Status: DC | PRN
Start: 2023-04-17 — End: 2023-04-23

## 2023-04-17 MED ORDER — METHYLPREDNISOLONE SODIUM SUCC 125 MG IJ SOLR
125.0000 mg | Freq: Two times a day (BID) | INTRAMUSCULAR | Status: AC
  Administered 2023-04-17 – 2023-04-18 (×2): 125 mg via INTRAVENOUS
  Filled 2023-04-17 (×2): qty 2

## 2023-04-17 MED ORDER — AZITHROMYCIN 250 MG PO TABS
500.0000 mg | ORAL_TABLET | Freq: Every day | ORAL | Status: AC
  Administered 2023-04-18 – 2023-04-19 (×2): 500 mg via ORAL
  Filled 2023-04-17 (×2): qty 2

## 2023-04-17 MED ORDER — ACETAMINOPHEN 325 MG PO TABS
650.0000 mg | ORAL_TABLET | Freq: Once | ORAL | Status: AC
Start: 2023-04-17 — End: 2023-04-17
  Filled 2023-04-17: qty 2

## 2023-04-17 MED ORDER — ONDANSETRON HCL 4 MG PO TABS: 4.0000 mg | ORAL_TABLET | Freq: Four times a day (QID) | ORAL | Status: DC | PRN

## 2023-04-17 MED ORDER — SODIUM CHLORIDE 0.9 % IV SOLN
500.0000 mg | INTRAVENOUS | Status: AC
  Administered 2023-04-17: 500 mg via INTRAVENOUS
  Filled 2023-04-17: qty 5

## 2023-04-17 MED ORDER — SODIUM CHLORIDE 0.9 % IV SOLN: INTRAVENOUS | Status: DC

## 2023-04-17 MED ORDER — ALBUTEROL SULFATE (2.5 MG/3ML) 0.083% IN NEBU: 2.5000 mg | INHALATION_SOLUTION | RESPIRATORY_TRACT | Status: DC | PRN

## 2023-04-17 MED ORDER — PREDNISONE 20 MG PO TABS
40.0000 mg | ORAL_TABLET | Freq: Every day | ORAL | Status: AC
  Administered 2023-04-18 – 2023-04-21 (×4): 40 mg via ORAL
  Filled 2023-04-17 (×4): qty 2

## 2023-04-17 MED ORDER — INSULIN ASPART 100 UNIT/ML IJ SOLN
0.0000 [IU] | Freq: Three times a day (TID) | INTRAMUSCULAR | Status: DC
  Administered 2023-04-18: 2 [IU] via SUBCUTANEOUS
  Administered 2023-04-18: 1 [IU] via SUBCUTANEOUS
  Administered 2023-04-18 – 2023-04-19 (×3): 2 [IU] via SUBCUTANEOUS
  Administered 2023-04-19: 1 [IU] via SUBCUTANEOUS
  Administered 2023-04-20: 5 [IU] via SUBCUTANEOUS
  Administered 2023-04-20 – 2023-04-21 (×2): 2 [IU] via SUBCUTANEOUS
  Administered 2023-04-21: 3 [IU] via SUBCUTANEOUS
  Administered 2023-04-22: 2 [IU] via SUBCUTANEOUS
  Administered 2023-04-23: 1 [IU] via SUBCUTANEOUS
  Filled 2023-04-17 (×12): qty 1

## 2023-04-17 MED ORDER — IPRATROPIUM-ALBUTEROL 0.5-2.5 (3) MG/3ML IN SOLN
3.0000 mL | Freq: Once | RESPIRATORY_TRACT | Status: AC
  Administered 2023-04-17: 3 mL via RESPIRATORY_TRACT
  Filled 2023-04-17: qty 3

## 2023-04-17 MED ORDER — IPRATROPIUM-ALBUTEROL 0.5-2.5 (3) MG/3ML IN SOLN
3.0000 mL | Freq: Four times a day (QID) | RESPIRATORY_TRACT | Status: DC
  Administered 2023-04-17 – 2023-04-19 (×10): 3 mL via RESPIRATORY_TRACT
  Filled 2023-04-17 (×10): qty 3

## 2023-04-17 MED ORDER — ACETAMINOPHEN 325 MG PO TABS
ORAL_TABLET | ORAL | Status: AC
  Administered 2023-04-17: 650 mg via ORAL
  Filled 2023-04-17: qty 1

## 2023-04-17 MED ORDER — HYDROCODONE-ACETAMINOPHEN 5-325 MG PO TABS: 1.0000 | ORAL_TABLET | ORAL | Status: DC | PRN

## 2023-04-17 NOTE — ED Triage Notes (Signed)
 Patient to ED via [facility] from home after a fall. PT was walking with walker and got tripped up while walking on side walk. C/o right hip pain. PT noted to have garbled speech- baseline since January per family.

## 2023-04-17 NOTE — ED Provider Notes (Signed)
 [facility] Provider Note    Event Date/Time   First MD Initiated Contact with Patient 04/17/23 1209     (approximate)   History   Fall   HPI  [name] is a 72 y.o. female with PMH of hypertension, hypothyroidism, COPD, psoriasis, arthritis, neuromuscular disorder, chronic back pain who presents for evaluation after a fall.  Patient describes losing her balance, she states her walker went 1 way and she fell the other way.  She did not hit her head on the fall.  Patient states she has had multiple falls in the past week.  She had back surgery at the end of January and feels like her pain has not improved from this.  She was recently hospitalized from 2/18 to 2/21 for acute metabolic encephalopathy and then went to a nursing facility.  Family reports she was discharged from the nursing facility after about a week because her insurance coverage ran out.  Patient lives alone.  She states she has had numbness in the left leg for a while.  She also has garbled speech which is at patient's baseline.  She reports pain to the right hip at this time.      Physical Exam   Triage Vital Signs: ED Triage Vitals  Encounter Vitals Group     BP 04/17/23 1118 (!) 102/51     Systolic BP Percentile --      Diastolic BP Percentile --      Pulse Rate 04/17/23 1118 88     Resp 04/17/23 1118 16     Temp 04/17/23 1118 97.8 F (36.6 C)     Temp Source 04/17/23 1118 Oral     SpO2 04/17/23 1118 92 %     Weight 04/17/23 1116 185 lb 3 oz (84 kg)     Height 04/17/23 1116 5\' 3"  (1.6 m)     Head Circumference --      Peak Flow --      Pain Score 04/17/23 1116 7     Pain Loc --      Pain Education --      Exclude from Growth Chart --     Most recent vital signs: Vitals:   04/17/23 1118  BP: (!) 102/51  Pulse: 88  Resp: 16  Temp: 97.8 F (36.6 C)  SpO2: 92%   General: Awake, no distress.  Garbled speech. CV:  Good peripheral perfusion.  RRR. Resp:  Normal effort.   CTAB. Abd:  No distention.  Other:  Dorsalis pedis pulses are 1+ bilaterally.  Loss of sensation in the left lower extremity, sensation maintained in right lower extremity, weakness noted bilaterally in lower extremities.   ED Results / Procedures / Treatments   Labs (all labs ordered are listed, but only abnormal results are displayed) Labs Reviewed  RESP PANEL BY RT-PCR (RSV, FLU A&B, COVID)  RVPGX2 - Abnormal; Notable for the following components:      Result Value   Resp Syncytial Virus by PCR POSITIVE (*)    All other components within normal limits  COMPREHENSIVE METABOLIC PANEL - Abnormal; Notable for the following components:   Glucose, Bld 114 (*)    BUN 24 (*)    Creatinine, Ser 1.25 (*)    GFR, Estimated 45 (*)    All other components within normal limits  CBC WITH DIFFERENTIAL/PLATELET - Abnormal; Notable for the following components:   RBC 3.83 (*)    Hemoglobin 11.1 (*)    All  other components within normal limits  URINALYSIS, ROUTINE W REFLEX MICROSCOPIC     EKG  ED Provider interpretation:NSR with ST and T wave abnormality.  Vent. rate 86 BPM PR interval 154 ms QRS duration 80 ms QT/QTcB 362/433 ms P-R-T axes 43 43 262   RADIOLOGY  Right hip and chest x-ray obtained, interpreted the images as well as reviewed the radiologist report.  Hip x-ray is negative for fracture. I do not see any abnormalities on chest xray but radiology read is pending.   PROCEDURES:  Critical Care performed: No  Procedures   MEDICATIONS ORDERED IN ED: Medications  ipratropium-albuterol (DUONEB) 0.5-2.5 (3) MG/3ML nebulizer solution 3 mL (has no administration in time range)  acetaminophen (TYLENOL) tablet 650 mg (650 mg Oral Given 04/17/23 1257)     IMPRESSION / MDM / ASSESSMENT AND PLAN / ED COURSE  I reviewed the triage vital signs and the nursing notes.                             73 year old female presents with right hip pain after mechanical fall, vital signs  are stable patient NAD on exam.  Differential diagnosis includes, but is not limited to, hip fracture, hip dislocation, muscle strain, generalized weakness, cardiac dysrhythmia, electrolyte abnormality.  Patient's presentation is most consistent with acute complicated illness / injury requiring diagnostic workup.  CBC shows anemia otherwise unremarkable.  CMP notable for mildly elevated creatinine and BUN.  Respiratory panel positive for RSV.  X-ray of the hip is negative for fracture. Do not see any acute abnormalities on CXR but radiology read is still pending.   After discussion with patient's family, sounds like her frequent falls are due to weakness.  I did get patient up and watched her walk in the room while monitoring her pulse ox.  Pulse ox did drop to 67 at 1 point and hovered around 85 most of the time.  Patient did not report feeling lightheaded or dizzy when walking.  Given her hypoxia when walking she will she is most appropriate for an admission.  Consult to hospitalist placed.  Patient was agreeable to admission.      FINAL CLINICAL IMPRESSION(S) / ED DIAGNOSES   Final diagnoses:  Weakness     Rx / DC Orders   ED Discharge Orders     None        Note:  This document was prepared using Dragon voice recognition software and may include unintentional dictation errors.   Cameron Ali, PA-C 04/17/23 1525    Jene Every, MD 04/20/23 [phone]

## 2023-04-17 NOTE — Assessment & Plan Note (Addendum)
 COPD Exacerbation  RSV Decompensated respiratory status now requiring 2 L nasal cannula in the setting of overlapping RSV and COPD exacerbation Chest x-ray grossly stable Noted RSV positive on respiratory screen IV Solu-Medrol DuoNebs IV azithromycin Supplemental oxygen Symptomatic management Monitor

## 2023-04-17 NOTE — Assessment & Plan Note (Signed)
 Blood sugar 110s SSI A1c Monitor with steroid use

## 2023-04-17 NOTE — Assessment & Plan Note (Signed)
 Continue statin.

## 2023-04-17 NOTE — Assessment & Plan Note (Addendum)
 Weakness Recurrent falls at home with noted progressive worsening weakness per the son at the bedside Noted positive trauma to the left hip Plain films within normal limits Noted recent laminectomy on 01/23  Progressive worsening functional status at home per report Grossly nonfocal neuroexam Will plan for formal PT OT evaluation Fall precautions Anticipate rehab versus placement

## 2023-04-17 NOTE — Assessment & Plan Note (Addendum)
+   generalized confusion on presentation in setting of COPD exacerbation, RSV, decompensated resp failure  Grossly nonfocal neuro exam Son reports confusion being subacute vs. Chronic issue- ? Underlying dementia Unclear of general baseline  Will otherwise monitor for now and follow with treatment.

## 2023-04-17 NOTE — H&P (Addendum)
 History and Physical    Patient: [name] OZH:[id number] DOB: Jan 18, 1951 DOA: 04/17/2023 DOS: the patient was seen and examined on 04/17/2023 PCP: Lorre Munroe, NP  Patient coming from: Home  Chief Complaint:  Chief Complaint  Patient presents with   Fall   HPI: [name] is a 73 y.o. female with medical history significant of hypertension, hypothyroidism, COPD, GERD, arthritis, psoriasis on monoclonal antibody injections, anxiety disorder, peripheral vascular disease, status post recent laminectomy on 02/2023 presenting with fall, hip pain, acute respiratory failure with hypoxia, COPD exacerbation, RSV, weakness.  Son reports mother with increased work of breathing over the past 2 to 2 days.  Positive cough wheezing and increased bleeding production.  No longer smoking.  Noted to have been admitted February 2025 for issues including metabolic encephalopathy with concern for UTI as well as influenza A.  Per the son, patient is had progressive decline over several months.  Does live at home alone.  Son feels patient being at home alone is a chronic safety concern.  Also with worsening confusion. Had noted fall with no reported head trauma or loss of consciousness.  No reported sick contacts.  No reports of chest pain, abdominal pain, nausea or vomiting.  Son reports prior history of recurrent falls prior to this admission. Presented to the ER afebrile, hemodynamically stable.  Satting well on room air.  White count 7.4, hemoglobin 11.1, platelets 276, creatinine 1.25, glucose 114, RSV positive.  Chest x-ray and R hip plain films grossly stable. Review of Systems: As mentioned in the history of present illness. All other systems reviewed and are negative. Past Medical History:  Diagnosis Date   Anxiety    Arthritis    Chronic back pain    COPD (chronic obstructive pulmonary disease) (HCC)    Depression    GERD (gastroesophageal reflux disease)    Hypertension    Hypothyroidism     Neuromuscular disorder (HCC)    Pneumonia    RECENT X 3   PONV (postoperative nausea and vomiting)    Psoriasis    Thyroid disease    Past Surgical History:  Procedure Laterality Date   ABDOMINAL HYSTERECTOMY     heavy bleeding   bladder stimulator     CARPAL TUNNEL RELEASE     CATARACT EXTRACTION W/PHACO Right 03/07/2015   Procedure: CATARACT EXTRACTION PHACO AND INTRAOCULAR LENS PLACEMENT (IOC);  Surgeon: Sallee Lange, MD;  Location: [facility];  Service: Ophthalmology;  Laterality: Right;  [name] 01:14 AP% 26.4 CDE 33.04 fluid pack lot # [id number]   CATARACT EXTRACTION W/PHACO Left 03/21/2015   Procedure: CATARACT EXTRACTION PHACO AND INTRAOCULAR LENS PLACEMENT (IOC);  Surgeon: Sallee Lange, MD;  Location: [facility];  Service: Ophthalmology;  Laterality: Left;  [name] 01:21 AP% 25.6 CDE 39.78 fluid pack lot # [id number]   HAND SURGERY Right    INTERSTIM IMPLANT REMOVAL N/A 09/07/2015   Procedure: REMOVAL OF INTERSTIM IMPLANT;  Surgeon: Alfredo Martinez, MD;  Location: [facility];  Service: Urology;  Laterality: N/A;   LUMBAR LAMINECTOMY/DECOMPRESSION MICRODISCECTOMY N/A 02/28/2023   Procedure: L3-4 LAMINECTOMY, L4-5 LAMINECTOMY AND DISCECTOMY OF LEFT SIDE, L5-S1 LAMINECTOMY;  Surgeon: Lovenia Kim, MD;  Location: [facility];  Service: Neurosurgery;  Laterality: N/A;   OOPHORECTOMY Right    ovarian cyst   Social History:  reports that she quit smoking about 2 years ago. Her smoking use included cigarettes. She started smoking about 4 years ago. She has a 35 pack-year smoking history. She has never used  smokeless tobacco. She reports that she does not drink alcohol and does not use drugs.  Allergies  Allergen Reactions   Ciprofloxacin Hives and Diarrhea   Gabapentin Other (See Comments)    Hallucination   Trazodone And Nefazodone Other (See Comments)    Altered Mental Status    Trazodone Other (See Comments)   Mucinex [Guaifenesin Er] Nausea Only   Oxycodone Diarrhea   Sulfa  Antibiotics Rash    Throat, mouth Throat, mouth    Family History  Problem Relation Age of Onset   Heart disease Mother    Breast cancer Mother    Heart attack Mother    COPD Father    Stroke Paternal Uncle    Kidney disease Sister    Breast cancer Cousin    Breast cancer Cousin    Heart attack Maternal Aunt    Stroke Paternal Grandfather    Kidney cancer Neg Hx    Bladder Cancer Neg Hx     Prior to Admission medications   Medication Sig Start Date End Date Taking? Authorizing Provider  acetaminophen (TYLENOL) 500 MG tablet Take 500 mg by mouth every 6 (six) hours as needed for moderate pain.    Yes [provider]  albuterol (VENTOLIN HFA) 108 (90 Base) MCG/ACT inhaler INHALE 2 PUFFS BY MOUTH EVERY 6 HOURS AS NEEDED FOR WHEEZING OR SHORTNESS OF BREATH 04/16/22  Yes Sunnie Nielsen, DO  aspirin EC (ASPIRIN LOW DOSE) 81 MG tablet TAKE 1 TABLET(81 MG) BY MOUTH DAILY. SWALLOW WHOLE 11/07/22  Yes Baity, Salvadore Oxford, NP  carvedilol (COREG) 3.125 MG tablet TAKE 1 TABLET BY MOUTH TWICE DAILY WITH A MEAL 02/07/23  Yes Baity, Salvadore Oxford, NP  citalopram (CELEXA) 20 MG tablet Take 1 tablet (20 mg total) by mouth daily. 03/30/23  Yes Sreenath, Sudheer B, MD  clonazePAM (KLONOPIN) 1 MG tablet TAKE 1 TABLET BY MOUTH EVERY MORNING, 1/2 TABLET EVERY AFTERNOON AND 1 TABLET EVERY NIGHT AT BEDTIME.  SNF use only 03/29/23  Yes Sreenath, Sudheer B, MD  diltiazem (CARDIZEM CD) 120 MG 24 hr capsule TAKE 1 CAPSULE BY MOUTH EVERY DAY 02/07/23  Yes Baity, Salvadore Oxford, NP  doxepin (SINEQUAN) 25 MG capsule Take 50 mg by mouth at bedtime. 11/01/22  Yes [provider]  fenofibrate (TRICOR) 145 MG tablet Take 1 tablet (145 mg total) by mouth daily. 02/08/23  Yes Lorre Munroe, NP  levothyroxine (SYNTHROID) 100 MCG tablet Take 1 tablet (100 mcg total) by mouth daily. 10/04/22  Yes Baity, Salvadore Oxford, NP  lisinopril (ZESTRIL) 10 MG tablet TAKE 1 TABLET(10 MG) BY MOUTH DAILY 02/07/23  Yes Baity, Salvadore Oxford, NP   rosuvastatin (CRESTOR) 20 MG tablet Take 1 tablet (20 mg total) by mouth daily. 08/30/22  Yes Baity, Salvadore Oxford, NP  senna (SENOKOT) 8.6 MG TABS tablet Take 1 tablet (8.6 mg total) by mouth 2 (two) times daily as needed for mild constipation. 03/08/23  Yes Susanne Borders, PA  triamcinolone ointment (KENALOG) 0.1 % Apply 1 Application topically as needed.   Yes Pharmacy, MD  Misc. Devices (QUAD CANE/SMALL BASE) MISC 1 Device by Does not apply route daily. 06/26/17   Galen Manila, NP  pantoprazole (PROTONIX) 40 MG tablet SMARTSIG:1 Tablet(s) By Mouth Every Evening 11/30/22   [provider]    Physical Exam: Vitals:   04/17/23 1116 04/17/23 1118  BP:  (!) 102/51  Pulse:  88  Resp:  16  Temp:  97.8 F (36.6 C)  TempSrc:  Oral  SpO2:  92%  Weight: 84 kg   Height: 5\' 3"  (1.6 m)    Physical Exam Constitutional:      Appearance: She is obese.  HENT:     Head: Normocephalic and atraumatic.     Nose: Nose normal.     Mouth/Throat:     Mouth: Mucous membranes are moist.  Eyes:     Pupils: Pupils are equal, round, and reactive to light.  Cardiovascular:     Rate and Rhythm: Normal rate and regular rhythm.  Pulmonary:     Effort: Pulmonary effort is normal.     Breath sounds: Wheezing present.  Abdominal:     General: Bowel sounds are normal.  Musculoskeletal:        General: Normal range of motion.  Skin:    General: Skin is warm.  Neurological:     General: No focal deficit present.  Psychiatric:        Mood and Affect: Mood normal.     Data Reviewed:  There are no new results to review at this time.  DG Chest 2 View CLINICAL DATA:  Weakness.  EXAM: CHEST - 2 VIEW  COMPARISON:  03/25/2023.  FINDINGS: Bilateral lung fields are clear. Bilateral costophrenic angles are clear.  Normal cardio-mediastinal silhouette.  No acute osseous abnormalities.  The soft tissues are within normal limits.  IMPRESSION: No active cardiopulmonary  disease.  Electronically Signed   By: Jules Schick M.D.   On: 04/17/2023 15:26 DG Hip Unilat W or Wo Pelvis 2-3 Views Right CLINICAL DATA:  Fall.  Right hip pain.  EXAM: DG HIP (WITH OR WITHOUT PELVIS) 2-3V RIGHT  COMPARISON:  None Available.  FINDINGS: Pelvis is intact with normal and symmetric sacroiliac joints.  No acute fracture or dislocation.  No aggressive osseous lesion.  Visualized sacral arcuate lines are unremarkable.  Unremarkable symphysis pubis.  There are mild degenerative changes of bilateral hip joints without significant joint space narrowing. Osteophytosis of the superior acetabulum.  No radiopaque foreign bodies.  IMPRESSION: No acute osseous abnormality of the pelvis or right hip joint.  Electronically Signed   By: Jules Schick M.D.   On: 04/17/2023 13:32  Lab Results  Component Value Date   WBC 7.4 04/17/2023   HGB 11.1 (L) 04/17/2023   HCT 36.5 04/17/2023   MCV 95.3 04/17/2023   PLT 276 04/17/2023   Last metabolic panel Lab Results  Component Value Date   GLUCOSE 114 (H) 04/17/2023   NA 140 04/17/2023   K 3.9 04/17/2023   CL 101 04/17/2023   CO2 28 04/17/2023   BUN 24 (H) 04/17/2023   CREATININE 1.25 (H) 04/17/2023   GFRNONAA 45 (L) 04/17/2023   CALCIUM 9.4 04/17/2023   PHOS 3.7 12/01/2017   PROT 6.7 04/17/2023   ALBUMIN 3.5 04/17/2023   LABGLOB 2.6 06/24/2015   AGRATIO 1.7 06/24/2015   BILITOT 0.9 04/17/2023   ALKPHOS 65 04/17/2023   AST 23 04/17/2023   ALT 19 04/17/2023   ANIONGAP 11 04/17/2023    Assessment and Plan: Acute respiratory failure with hypoxia (HCC) COPD Exacerbation  RSV Decompensated respiratory status now requiring 2 L nasal cannula in the setting of overlapping RSV and COPD exacerbation Chest x-ray grossly stable Noted RSV positive on respiratory screen IV Solu-Medrol DuoNebs IV azithromycin Supplemental oxygen Symptomatic management Monitor  Fall Weakness Recurrent falls at home with  noted progressive worsening weakness per the son at the bedside Noted positive trauma to the left hip Plain films within normal  limits Noted recent laminectomy on 01/23  Progressive worsening functional status at home per report Grossly nonfocal neuroexam Will plan for formal PT OT evaluation Fall precautions Anticipate rehab versus placement   Encephalopathy + generalized confusion on presentation in setting of COPD exacerbation, RSV, decompensated resp failure  Grossly nonfocal neuro exam Son reports confusion being subacute vs. Chronic issue- ? Underlying dementia Unclear of general baseline  Will otherwise monitor for now and follow with treatment.    Hypothyroidism Synthroid  Mixed hyperlipidemia Continue statin  Benign essential HTN BP stable Titrate regimen  DM (diabetes mellitus), type 2 (HCC) Blood sugar 110s SSI A1c Monitor with steroid use      Advance Care Planning:   Code Status: Full Code   Consults: None   Family Communication: Son at the bedside  Severity of Illness: The appropriate patient status for this patient is INPATIENT. Inpatient status is judged to be reasonable and necessary in order to provide the required intensity of service to ensure the patient's safety. The patient's presenting symptoms, physical exam findings, and initial radiographic and laboratory data in the context of their chronic comorbidities is felt to place them at high risk for further clinical deterioration. Furthermore, it is not anticipated that the patient will be medically stable for discharge from the hospital within 2 midnights of admission.   * I certify that at the point of admission it is my clinical judgment that the patient will require inpatient hospital care spanning beyond 2 midnights from the point of admission due to high intensity of service, high risk for further deterioration and high frequency of surveillance required.*  Author: Floydene Flock,  MD 04/17/2023 4:49 PM  For on call review [email or web address].

## 2023-04-17 NOTE — Assessment & Plan Note (Signed)
BP stable Titrate regimen

## 2023-04-17 NOTE — ED Notes (Signed)
 Spoke to pt son and provided update. Kief,William [phone])

## 2023-04-17 NOTE — Progress Notes (Deleted)
 Subjective:    Patient ID: [name], female    DOB: 04-19-1950, 73 y.o.   MRN: [id number]  HPI  Patient presents to clinic today for TCM hospital follow-up.  She was brought to the ER 2/17 with complaint of altered mental status, generalized weakness and frequent falls.  Initial labs were suggestive of influenza and presumed UTI.  She received a 3-day course of rocephin and a course of tamiflu.  Chest x-ray was negative.  CT of the head was negative for acute findings.  CT abdomen/pelvis did not show any acute findings.  Lower extremity ultrasound was negative for DVT.  They felt like some of her altered mental status was secondary to her cyclobenzaprine, clonazepam and tramadol.  She was discharged from the hospital 2/21 to SNF.  She was discharged from the SNF on 3/3  Since that time.   Review of Systems     Past Medical History:  Diagnosis Date   Anxiety    Arthritis    Chronic back pain    COPD (chronic obstructive pulmonary disease) (HCC)    Depression    GERD (gastroesophageal reflux disease)    Hypertension    Hypothyroidism    Neuromuscular disorder (HCC)    Pneumonia    RECENT X 3   PONV (postoperative nausea and vomiting)    Psoriasis    Thyroid disease     Current Outpatient Medications  Medication Sig Dispense Refill   acetaminophen (TYLENOL) 500 MG tablet Take 500 mg by mouth every 6 (six) hours as needed for moderate pain.      albuterol (VENTOLIN HFA) 108 (90 Base) MCG/ACT inhaler INHALE 2 PUFFS BY MOUTH EVERY 6 HOURS AS NEEDED FOR WHEEZING OR SHORTNESS OF BREATH 8.5 g 2   aspirin EC (ASPIRIN LOW DOSE) 81 MG tablet TAKE 1 TABLET(81 MG) BY MOUTH DAILY. SWALLOW WHOLE 90 tablet 1   benzonatate (TESSALON) 200 MG capsule Take 1 capsule (200 mg total) by mouth 3 (three) times daily as needed for cough.     carvedilol (COREG) 3.125 MG tablet TAKE 1 TABLET BY MOUTH TWICE DAILY WITH A MEAL 180 tablet 0   citalopram (CELEXA) 20 MG tablet Take 1 tablet (20 mg total) by  mouth daily.     clonazePAM (KLONOPIN) 1 MG tablet TAKE 1 TABLET BY MOUTH EVERY MORNING, 1/2 TABLET EVERY AFTERNOON AND 1 TABLET EVERY NIGHT AT BEDTIME.  SNF use only 12 tablet 0   cyclobenzaprine (FLEXERIL) 10 MG tablet Take 1 tablet (10 mg total) by mouth 3 (three) times daily as needed for muscle spasms. 90 tablet 0   diltiazem (CARDIZEM CD) 120 MG 24 hr capsule TAKE 1 CAPSULE BY MOUTH EVERY DAY 90 capsule 0   doxepin (SINEQUAN) 25 MG capsule Take 50 mg by mouth at bedtime.     fenofibrate (TRICOR) 145 MG tablet Take 1 tablet (145 mg total) by mouth daily. 90 tablet 0   hydrOXYzine (ATARAX) 25 MG tablet Take 25 mg by mouth at bedtime.     ipratropium-albuterol (DUONEB) 0.5-2.5 (3) MG/3ML SOLN Take 3 mLs by nebulization every 6 (six) hours as needed (wheezing / shortness of breath). 60 mL 0   levothyroxine (SYNTHROID) 100 MCG tablet Take 1 tablet (100 mcg total) by mouth daily. 90 tablet 1   lisinopril (ZESTRIL) 10 MG tablet TAKE 1 TABLET(10 MG) BY MOUTH DAILY 90 tablet 0   Misc. Devices (QUAD CANE/SMALL BASE) MISC 1 Device by Does not apply route daily. 1 each 0  pantoprazole (PROTONIX) 40 MG tablet SMARTSIG:1 Tablet(s) By Mouth Every Evening     rosuvastatin (CRESTOR) 20 MG tablet Take 1 tablet (20 mg total) by mouth daily. 90 tablet 3   senna (SENOKOT) 8.6 MG TABS tablet Take 1 tablet (8.6 mg total) by mouth 2 (two) times daily as needed for mild constipation. 100 tablet 0   triamcinolone ointment (KENALOG) 0.1 % Apply 1 Application topically as needed.     No current facility-administered medications for this visit.    Allergies  Allergen Reactions   Ciprofloxacin Hives and Diarrhea   Gabapentin Other (See Comments)    Hallucination   Trazodone And Nefazodone Other (See Comments)    Altered Mental Status    Trazodone Other (See Comments)   Mucinex [Guaifenesin Er] Nausea Only   Oxycodone Diarrhea   Oxycodone-Acetaminophen Diarrhea   Oxycodone-Acetaminophen Palpitations   Sulfa  Antibiotics Rash    Throat, mouth Throat, mouth    Family History  Problem Relation Age of Onset   Heart disease Mother    Breast cancer Mother    Heart attack Mother    COPD Father    Stroke Paternal Uncle    Kidney disease Sister    Breast cancer Cousin    Breast cancer Cousin    Heart attack Maternal Aunt    Stroke Paternal Grandfather    Kidney cancer Neg Hx    Bladder Cancer Neg Hx     Social History   Socioeconomic History   Marital status: Widowed    Spouse name: Not on file   Number of children: Not on file   Years of education: Not on file   Highest education level: Not on file  Occupational History   Occupation: retired  Tobacco Use   Smoking status: Former    Current packs/day: 0.00    Average packs/day: 1 pack/day for 35.0 years (35.0 ttl pk-yrs)    Types: Cigarettes    Start date: 04/19/2018    Quit date: 11/16/2020    Years since quitting: 2.4   Smokeless tobacco: Never   Tobacco comments:    Previously smoked 2 ppd.   Vaping Use   Vaping status: Never Used  Substance and Sexual Activity   Alcohol use: No   Drug use: No   Sexual activity: Not Currently    Birth control/protection: Surgical  Other Topics Concern   Not on file  Social History Narrative   Not on file   Social Drivers of Health   Financial Resource Strain: Low Risk  (04/27/2022)   Overall Financial Resource Strain (CARDIA)    Difficulty of Paying Living Expenses: Not hard at all  Food Insecurity: No Food Insecurity (03/26/2023)   Hunger Vital Sign    Worried About Running Out of Food in the Last Year: Never true    Ran Out of Food in the Last Year: Never true  Transportation Needs: No Transportation Needs (03/26/2023)   PRAPARE - Administrator, Civil Service (Medical): No    Lack of Transportation (Non-Medical): No  Physical Activity: Inactive (04/27/2022)   Exercise Vital Sign    Days of Exercise per Week: 0 days    Minutes of Exercise per Session: 0 min   Stress: No Stress Concern Present (04/27/2022)   [employer] of Occupational Health - Occupational Stress Questionnaire    Feeling of Stress : Only a little  Social Connections: Moderately Isolated (03/26/2023)   Social Connection and Isolation Panel [NHANES]    Frequency of  Communication with Friends and Family: More than three times a week    Frequency of Social Gatherings with Friends and Family: Three times a week    Attends Religious Services: More than 4 times per year    Active Member of Clubs or Organizations: No    Attends Banker Meetings: Never    Marital Status: Widowed  Intimate Partner Violence: Not At Risk (03/26/2023)   Humiliation, Afraid, Rape, and Kick questionnaire    Fear of Current or Ex-Partner: No    Emotionally Abused: No    Physically Abused: No    Sexually Abused: No     Constitutional: Denies fever, malaise, fatigue, headache or abrupt weight changes.  HEENT: Denies eye pain, eye redness, ear pain, ringing in the ears, wax buildup, runny nose, nasal congestion, bloody nose, or sore throat. Respiratory: Denies difficulty breathing, shortness of breath, cough or sputum production.   Cardiovascular: Denies chest pain, chest tightness, palpitations or swelling in the hands or feet.  Gastrointestinal: Denies abdominal pain, bloating, constipation, diarrhea or blood in the stool.  GU: Pt reports dysuria. Denies urgency, frequency, burning sensation, blood in urine, odor or discharge. Musculoskeletal: Patient reports joint pain, right side low back pain.  Denies decrease in range of motion, difficulty with gait, muscle pain or joint swelling.  Skin: Denies redness, rashes, lesions or ulcercations.  Neurological: Patient reports insomnia.  Denies dizziness, difficulty with memory, difficulty with speech or problems with balance and coordination.  Psych: Patient has a history of anxiety and depression.  Denies SI/HI.  No other specific complaints in  a complete review of systems (except as listed in HPI above).  Objective:   Physical Exam There were no vitals taken for this visit.  Wt Readings from Last 3 Encounters:  03/25/23 187 lb (84.8 kg)  03/13/23 202 lb (91.6 kg)  02/28/23 202 lb 9.6 oz (91.9 kg)    General: Appears her stated age, obese, in NAD. Skin: Warm, dry and intact. Psoriasis noted of BLE.  No ulcerations noted. HEENT: Head: normal shape and size; Eyes: sclera white, no icterus, conjunctiva pink, PERRLA and EOMs intact;  Neck:  Neck supple, trachea midline. No masses, lumps or thyromegaly present.  Cardiovascular: Normal rate and rhythm. S1,S2 noted.  No murmur, rubs or gallops noted. No JVD or BLE edema. No carotid bruits noted. Pulmonary/Chest: Normal effort and positive vesicular breath sounds. No respiratory distress. No wheezes, rales or ronchi noted.  Abdomen: Soft and nontender. Normal bowel sounds.  Musculoskeletal: Strength 5/5 BUE/BLE. No difficulty with gait.  Neurological: Alert and oriented. Cranial nerves II-XII grossly intact. Coordination normal.  Psychiatric: Mood and affect normal. Behavior is normal. Judgment and thought content normal.     BMET    Component Value Date/Time   NA 140 03/27/2023 0903   NA 142 06/24/2015 1102   NA 135 (L) 02/02/2013 1021   K 3.2 (L) 03/27/2023 0903   K 3.7 02/02/2013 1021   CL 105 03/27/2023 0903   CL 103 02/02/2013 1021   CO2 27 03/27/2023 0903   CO2 30 02/02/2013 1021   GLUCOSE 120 (H) 03/27/2023 0903   GLUCOSE 152 (H) 02/02/2013 1021   BUN 10 03/27/2023 0903   BUN 12 06/24/2015 1102   BUN 8 02/02/2013 1021   CREATININE 0.87 03/27/2023 0903   CREATININE 1.02 (H) 08/28/2022 1445   CALCIUM 8.5 (L) 03/27/2023 0903   CALCIUM 9.9 02/02/2013 1021   GFRNONAA >60 03/27/2023 0903   GFRNONAA 72 12/03/2019 0947  GFRAA 84 12/03/2019 0947    Lipid Panel     Component Value Date/Time   CHOL 117 08/28/2022 1445   CHOL 205 (H) 02/03/2015 1022   TRIG 197  (H) 08/28/2022 1445   HDL 42 (L) 08/28/2022 1445   HDL 36 (L) 02/03/2015 1022   CHOLHDL 2.8 08/28/2022 1445   LDLCALC 48 08/28/2022 1445    CBC    Component Value Date/Time   WBC 3.8 (L) 03/27/2023 0903   RBC 3.86 (L) 03/27/2023 0903   HGB 11.2 (L) 03/27/2023 0903   HGB 15.6 06/24/2015 1102   HCT 37.2 03/27/2023 0903   HCT 46.6 06/24/2015 1102   PLT 231 03/27/2023 0903   PLT 307 06/24/2015 1102   MCV 96.4 03/27/2023 0903   MCV 93 06/24/2015 1102   MCV 90 02/02/2013 1021   MCH 29.0 03/27/2023 0903   MCHC 30.1 03/27/2023 0903   RDW 14.1 03/27/2023 0903   RDW 13.3 06/24/2015 1102   RDW 13.2 02/02/2013 1021   LYMPHSABS 1.8 03/27/2023 0903   LYMPHSABS 2.3 06/24/2015 1102   MONOABS 0.5 03/27/2023 0903   EOSABS 0.3 03/27/2023 0903   EOSABS 0.3 06/24/2015 1102   BASOSABS 0.1 03/27/2023 0903   BASOSABS 0.1 06/24/2015 1102    Hgb A1C Lab Results  Component Value Date   HGBA1C 6.4 (A) 02/08/2023           Assessment & Plan:   TCM hospital follow-up for metabolic encephalopathy, influenza, UTI:  Hospital notes, labs and imaging reviewed  Schedule an appointment for follow-up of chronic conditions Nicki Reaper, NP

## 2023-04-17 NOTE — ED Notes (Signed)
 Report called to [facility] rn.   pt moved to room 23.

## 2023-04-17 NOTE — Assessment & Plan Note (Signed)
 Synthroid

## 2023-04-17 NOTE — ED Notes (Signed)
 See triage note  Pt requests to stay in wheelchair at this time for comfort.

## 2023-04-18 DIAGNOSIS — J121 Respiratory syncytial virus pneumonia: Secondary | ICD-10-CM | POA: Diagnosis not present

## 2023-04-18 LAB — GLUCOSE, CAPILLARY
Glucose-Capillary: 177 mg/dL — ABNORMAL HIGH (ref 70–99)
Glucose-Capillary: 150 mg/dL — ABNORMAL HIGH (ref 70–99)
Glucose-Capillary: 189 mg/dL — ABNORMAL HIGH (ref 70–99)

## 2023-04-18 LAB — BRAIN NATRIURETIC PEPTIDE: B Natriuretic Peptide: 501.3 pg/mL — ABNORMAL HIGH (ref 0.0–100.0)

## 2023-04-18 LAB — COMPREHENSIVE METABOLIC PANEL
ALT: 15 U/L (ref 0–44)
AST: 19 U/L (ref 15–41)
Albumin: 2.8 g/dL — ABNORMAL LOW (ref 3.5–5.0)
Alkaline Phosphatase: 56 U/L (ref 38–126)
Anion gap: 4 — ABNORMAL LOW (ref 5–15)
BUN: 25 mg/dL — ABNORMAL HIGH (ref 8–23)
CO2: 30 mmol/L (ref 22–32)
Calcium: 8.4 mg/dL — ABNORMAL LOW (ref 8.9–10.3)
Chloride: 107 mmol/L (ref 98–111)
Creatinine, Ser: 1.15 mg/dL — ABNORMAL HIGH (ref 0.44–1.00)
GFR, Estimated: 50 mL/min — ABNORMAL LOW (ref >60)
Glucose, Bld: 196 mg/dL — ABNORMAL HIGH (ref 70–99)
Potassium: 4.2 mmol/L (ref 3.5–5.1)
Sodium: 141 mmol/L (ref 135–145)
Total Bilirubin: 0.7 mg/dL (ref 0.0–1.2)
Total Protein: 5.9 g/dL — ABNORMAL LOW (ref 6.5–8.1)

## 2023-04-18 LAB — CBC
HCT: 33.8 % — ABNORMAL LOW (ref 36.0–46.0)
Hemoglobin: 10.1 g/dL — ABNORMAL LOW (ref 12.0–15.0)
MCH: 28.7 pg (ref 26.0–34.0)
MCHC: 29.9 g/dL — ABNORMAL LOW (ref 30.0–36.0)
MCV: 96 fL (ref 80.0–100.0)
Platelets: 212 10*3/uL (ref 150–400)
RBC: 3.52 MIL/uL — ABNORMAL LOW (ref 3.87–5.11)
RDW: 14.1 % (ref 11.5–15.5)
WBC: 3.1 10*3/uL — ABNORMAL LOW (ref 4.0–10.5)
nRBC: 0 % (ref 0.0–0.2)

## 2023-04-18 LAB — URINALYSIS, ROUTINE W REFLEX MICROSCOPIC
Bilirubin Urine: NEGATIVE
Glucose, UA: NEGATIVE mg/dL
Hgb urine dipstick: NEGATIVE
Ketones, ur: NEGATIVE mg/dL
Leukocytes,Ua: NEGATIVE
Nitrite: NEGATIVE
Protein, ur: NEGATIVE mg/dL
Specific Gravity, Urine: 1.009 (ref 1.005–1.030)
pH: 6 (ref 5.0–8.0)

## 2023-04-18 LAB — IRON AND TIBC
Iron: 57 ug/dL (ref 28–170)
Saturation Ratios: 15 % (ref 10.4–31.8)
TIBC: 393 ug/dL (ref 250–450)
UIBC: 336 ug/dL

## 2023-04-18 LAB — VITAMIN B12: Vitamin B-12: 228 pg/mL (ref 180–914)

## 2023-04-18 LAB — PHOSPHORUS: Phosphorus: 3.9 mg/dL (ref 2.5–4.6)

## 2023-04-18 LAB — FOLATE: Folate: 15.2 ng/mL (ref >5.9)

## 2023-04-18 LAB — CBG MONITORING, ED: Glucose-Capillary: 151 mg/dL — ABNORMAL HIGH (ref 70–99)

## 2023-04-18 LAB — MAGNESIUM: Magnesium: 1.9 mg/dL (ref 1.7–2.4)

## 2023-04-18 MED ORDER — PANTOPRAZOLE SODIUM 40 MG PO TBEC
40.0000 mg | DELAYED_RELEASE_TABLET | Freq: Every day | ORAL | Status: DC
  Administered 2023-04-18 – 2023-04-22 (×5): 40 mg via ORAL
  Filled 2023-04-18 (×5): qty 1

## 2023-04-18 MED ORDER — CARVEDILOL 6.25 MG PO TABS
3.1250 mg | ORAL_TABLET | Freq: Two times a day (BID) | ORAL | Status: DC
  Administered 2023-04-18 – 2023-04-23 (×12): 3.125 mg via ORAL
  Filled 2023-04-18 (×12): qty 1

## 2023-04-18 MED ORDER — ASPIRIN 81 MG PO TBEC
81.0000 mg | DELAYED_RELEASE_TABLET | Freq: Every day | ORAL | Status: DC
  Administered 2023-04-18 – 2023-04-23 (×6): 81 mg via ORAL
  Filled 2023-04-18 (×6): qty 1

## 2023-04-18 MED ORDER — FUROSEMIDE 10 MG/ML IJ SOLN
40.0000 mg | Freq: Two times a day (BID) | INTRAMUSCULAR | Status: DC
  Administered 2023-04-18 – 2023-04-19 (×2): 40 mg via INTRAVENOUS
  Filled 2023-04-18 (×2): qty 4

## 2023-04-18 MED ORDER — CLONAZEPAM 0.5 MG PO TABS
0.5000 mg | ORAL_TABLET | Freq: Three times a day (TID) | ORAL | Status: DC
  Administered 2023-04-18 – 2023-04-23 (×17): 0.5 mg via ORAL
  Filled 2023-04-18 (×17): qty 1

## 2023-04-18 MED ORDER — NYSTATIN 100000 UNIT/GM EX POWD
Freq: Two times a day (BID) | CUTANEOUS | Status: DC
  Filled 2023-04-18: qty 15

## 2023-04-18 MED ORDER — ORAL CARE MOUTH RINSE: 15.0000 mL | OROMUCOSAL | Status: DC | PRN

## 2023-04-18 MED ORDER — LEVOTHYROXINE SODIUM 100 MCG PO TABS
100.0000 ug | ORAL_TABLET | Freq: Every day | ORAL | Status: DC
  Administered 2023-04-19 – 2023-04-23 (×5): 100 ug via ORAL
  Filled 2023-04-18 (×5): qty 1

## 2023-04-18 NOTE — Evaluation (Signed)
 Physical Therapy Evaluation Patient Details Name: [name] MRN: [id number] DOB: [birth year] Today's Date: 04/18/2023  History of Present Illness  73 y.o. female with medical history significant of hypertension, hypothyroidism, COPD, GERD, arthritis, psoriasis on monoclonal antibody injections, anxiety disorder, peripheral vascular disease, status post recent laminectomy on 02/2023 presenting with fall, hip pain, acute respiratory failure with hypoxia, COPD exacerbation, RSV, weakness.  Son reports mother with increased work of breathing over the past 2 to 2 days.  Positive cough wheezing and increased bleeding production.  No longer smoking.  Noted to have been admitted February 2025 for issues including metabolic encephalopathy with concern for UTI as well as influenza A.  Per the son, patient is had progressive decline over several months.  Does live at home alone.  Son feels patient being at home alone is a chronic safety concern.  Also with worsening confusion. Had noted fall with no reported head trauma or loss of consciousness.  No reported sick contacts.  No reports of chest pain, abdominal pain, nausea or vomiting.  Clinical Impression  Patient resting in bed upon arrival to room; alert and oriented, follows commands and agreeable to participation with session.  Does voice need/urgency for toileting.  Endorses mild abdominal discomfort as a result; otherwise, no acute pain.  Bilat UE/LE strength and ROM grossly symmetrical and WFL; no focal weakness, no paresthesia reported.  Currently requiring min/mod assist for bed mobility; min assist +1-2 for sit/stand, standing balance, bed/chair and toilet transfer with RW.  Requires cuing for hand placement, tends to pull on RW; broad BOS, limited balance reactions requiring consistent min assist +1 for safety and fall prevention. Generally tremulous, slightly anxious; complicated by urgency for toileting. Will continue to assess/progress gait distance next  session as appropriate. Mild/mod SOB with exertion; sats >92% on [place] throughout session. Would benefit from skilled PT to address above deficits and promote optimal return to PLOF.; recommend post-acute PT follow up as indicated by interdisciplinary care team.          If plan is discharge home, recommend the following: Assistance with cooking/housework;Direct supervision/assist for medications management;Direct supervision/assist for financial management;Assist for transportation;Help with stairs or ramp for entrance;Supervision due to cognitive status;A lot of help with bathing/dressing/bathroom;A little help with walking and/or transfers   Can travel by private vehicle   Yes    Equipment Recommendations    Recommendations for Other Services       Functional Status Assessment Patient has had a recent decline in their functional status and demonstrates the ability to make significant improvements in function in a reasonable and predictable amount of time.     Precautions / Restrictions Precautions Precautions: Fall Precaution/Restrictions Comments: status post recent laminectomy on 01/23 Restrictions Weight Bearing Restrictions Per Provider Order: No      Mobility  Bed Mobility Overal bed mobility: Needs Assistance Bed Mobility: Supine to Sit     Supine to sit: Min assist          Transfers Overall transfer level: Needs assistance Equipment used: Rolling walker (2 wheels) Transfers: Sit to/from Stand, Bed to chair/wheelchair/BSC Sit to Stand: Min assist, +2 safety/equipment Stand pivot transfers: Min assist         General transfer comment: cuing for hand placement, tends to pull on RW; broad BOS, limited balance reactions requiring consistent min assist +1 for safety and fall prevention.  Generally tremulous, slightly anxious; complicated by urgency for toileting.    Ambulation/Gait  General Gait Details: deferred this session due to  toileting needs  Stairs            Wheelchair Mobility     Tilt Bed    Modified Rankin (Stroke Patients Only)       Balance Overall balance assessment: Needs assistance Sitting-balance support: No upper extremity supported, Feet supported Sitting balance-Leahy Scale: Good     Standing balance support: Bilateral upper extremity supported Standing balance-Leahy Scale: Poor                               Pertinent Vitals/Pain Pain Assessment Pain Assessment: No/denies pain    Home Living Family/patient expects to be discharged to:: Private residence Living Arrangements: Alone Available Help at Discharge: Family;Available PRN/intermittently Type of Home: Apartment Home Access: Level entry       Home Layout: One level Home Equipment: BSC/3in1;Rollator (4 wheels);Cane - single point Additional Comments: has son who lives close by, but he works night shift    Prior Function Prior Level of Function : Needs assist;History of Falls (last six months)             Mobility Comments: rollator for household distances; reports she has fallen 3x since surgery ADLs Comments: pt reports MOD I for ADL (use of rollator), increased time; assist for IADLs as needed from son. Multiple falls in the last few weeks.     Extremity/Trunk Assessment   Upper Extremity Assessment Upper Extremity Assessment: Generalized weakness    Lower Extremity Assessment Lower Extremity Assessment: Generalized weakness (grossly at least 4-/5 throughout; no focal weakness appreciated)       Communication   Communication Communication: No apparent difficulties    Cognition Arousal: Alert Behavior During Therapy: WFL for tasks assessed/performed   PT - Cognitive impairments: No apparent impairments                                 Cueing       General Comments      Exercises Other Exercises Other Exercises: Toilet transfer, SPT with RW, min assist +1;  generally tremulous, anxious.  Sit/stand and stnading balance with RW, min assist +1; dep of second person for hygiene.   Assessment/Plan    PT Assessment Patient needs continued PT services  PT Problem List Decreased strength;Decreased balance;Decreased mobility;Pain;Obesity;Decreased knowledge of use of DME;Decreased knowledge of precautions;Decreased activity tolerance       PT Treatment Interventions DME instruction;Gait training;Therapeutic activities;Therapeutic exercise;Stair training;Functional mobility training;Balance training;Patient/family education    PT Goals (Current goals can be found in the Care Plan section)  Acute Rehab PT Goals Patient Stated Goal: to go to the bathroom PT Goal Formulation: With patient Time For Goal Achievement: 05/02/23 Potential to Achieve Goals: Good    Frequency Min 2X/week     Co-evaluation               AM-PAC PT "6 Clicks" Mobility  Outcome Measure Help needed turning from your back to your side while in a flat bed without using bedrails?: A Little Help needed moving from lying on your back to sitting on the side of a flat bed without using bedrails?: A Little Help needed moving to and from a bed to a chair (including a wheelchair)?: A Little Help needed standing up from a chair using your arms (e.g., wheelchair or bedside chair)?: A Little Help needed  to walk in hospital room?: A Little Help needed climbing 3-5 steps with a railing? : A Lot 6 Click Score: 17    End of Session Equipment Utilized During Treatment: Gait belt Activity Tolerance: Patient tolerated treatment well Patient left: with call bell/phone within reach;in chair;with chair alarm set Nurse Communication: Mobility status PT Visit Diagnosis: Repeated falls (R29.6);Muscle weakness (generalized) (M62.81);Difficulty in walking, not elsewhere classified (R26.2)    Time: 1610-9604 PT Time Calculation (min) (ACUTE ONLY): 24 min   Charges:   PT Evaluation $PT  Eval Moderate Complexity: 1 Mod PT Treatments $Therapeutic Activity: 8-22 mins PT General Charges $$ ACUTE PT VISIT: 1 Visit        Asim Gersten H. Manson Passey, PT, DPT, NCS 04/18/23, 4:39 PM [phone]

## 2023-04-18 NOTE — Evaluation (Signed)
 Occupational Therapy Evaluation Patient Details Name: [name] MRN: [id number] DOB: 1950/05/19 Today's Date: 04/18/2023   History of Present Illness   73 y.o. female with medical history significant of hypertension, hypothyroidism, COPD, GERD, arthritis, psoriasis on monoclonal antibody injections, anxiety disorder, peripheral vascular disease, status post recent laminectomy on 02/2023 presenting with fall, hip pain, acute respiratory failure with hypoxia, COPD exacerbation, RSV, weakness.  Son reports mother with increased work of breathing over the past 2 to 2 days.  Positive cough wheezing and increased bleeding production.  No longer smoking.  Noted to have been admitted February 2025 for issues including metabolic encephalopathy with concern for UTI as well as influenza A.  Per the son, patient is had progressive decline over several months.  Does live at home alone.  Son feels patient being at home alone is a chronic safety concern.  Also with worsening confusion. Had noted fall with no reported head trauma or loss of consciousness.  No reported sick contacts.  No reports of chest pain, abdominal pain, nausea or vomiting.     Clinical Impressions Patient presenting with decreased Ind in self care,balance, functional mobility/transfers, endurance, and safety awareness. Patient reports recently returning home from rehab and getting [facility] therapies. Pt living at home alone with use of rollator for functional mobility. Pt's son live nearby and assist with IADLs. Multiple falls over the last few weeks.  Patient currently functioning at min - mod A for mobility and self care. Pt is on 3Ls via [place] and fatigues very quickly. She returns to supine at end of session.  Patient will benefit from acute OT to increase overall independence in the areas of ADLs, functional mobility, and safety awareness in order to safely discharge.     If plan is discharge home, recommend the following:   A little help with  walking and/or transfers;Assist for transportation;Assistance with cooking/housework;A little help with bathing/dressing/bathroom     Functional Status Assessment   Patient has had a recent decline in their functional status and demonstrates the ability to make significant improvements in function in a reasonable and predictable amount of time.     Equipment Recommendations   Other (comment) (defer to next venue of care)      Precautions/Restrictions   Precautions Precautions: Fall Precaution/Restrictions Comments: status post recent laminectomy on 01/23     Mobility Bed Mobility Overal bed mobility: Needs Assistance Bed Mobility: Supine to Sit, Sit to Supine     Supine to sit: Min assist Sit to supine: Min assist        Transfers Overall transfer level: Needs assistance Equipment used: 1 person hand held assist Transfers: Sit to/from Stand Sit to Stand: Min assist, Mod assist                  Balance Overall balance assessment: Needs assistance Sitting-balance support: Feet supported, Single extremity supported Sitting balance-Leahy Scale: Fair     Standing balance support: Single extremity supported Standing balance-Leahy Scale: Poor                             ADL either performed or assessed with clinical judgement   ADL Overall ADL's : Needs assistance/impaired                     Lower Body Dressing: Sit to/from stand;Moderate assistance;Minimal assistance   Toilet Transfer: Moderate assistance;Ambulation  Vision Patient Visual Report: No change from baseline              Pertinent Vitals/Pain Pain Assessment Pain Assessment: No/denies pain     Extremity/Trunk Assessment Upper Extremity Assessment Upper Extremity Assessment: Generalized weakness   Lower Extremity Assessment Lower Extremity Assessment: Generalized weakness       Communication Communication Communication: No  apparent difficulties   Cognition Arousal: Alert Behavior During Therapy: WFL for tasks assessed/performed Cognition: No apparent impairments                               Following commands: Intact       Cueing  General Comments   Cueing Techniques: Verbal cues              Home Living Family/patient expects to be discharged to:: Private residence Living Arrangements: Alone Available Help at Discharge: Family;Available PRN/intermittently Type of Home: Apartment Home Access: Level entry     Home Layout: One level     Bathroom Shower/Tub: Chief Strategy Officer: Handicapped height Bathroom Accessibility: Yes How Accessible: Accessible via walker Home Equipment: BSC/3in1;Rollator (4 wheels);Cane - single point   Additional Comments: has son who lives close by, but he works night shift      Prior Functioning/Environment Prior Level of Function : Needs assist;History of Falls (last six months)               ADLs Comments: pt reports MOD I for ADL (use of rollator), increased time; assist for IADLs as needed from son. Multiple falls in the last few weeks.    OT Problem List: Pain;Obesity;Decreased strength;Decreased range of motion;Decreased activity tolerance;Impaired balance (sitting and/or standing);Decreased knowledge of precautions;Decreased knowledge of use of DME or AE   OT Treatment/Interventions: Self-care/ADL training;Neuromuscular education;Energy conservation;DME and/or AE instruction;Therapeutic activities;Patient/family education;Balance training;Therapeutic exercise      OT Goals(Current goals can be found in the care plan section)   Acute Rehab OT Goals Patient Stated Goal: to go home and get stronger OT Goal Formulation: With patient/family Time For Goal Achievement: 05/02/23 Potential to Achieve Goals: Fair ADL Goals Pt Will Perform Grooming: with supervision;standing Pt Will Perform Lower Body Dressing: with  supervision;sit to/from stand Pt Will Transfer to Toilet: with supervision;ambulating Pt Will Perform Toileting - Clothing Manipulation and hygiene: with supervision;sit to/from stand   OT Frequency:  Min 2X/week       AM-PAC OT "6 Clicks" Daily Activity     Outcome Measure Help from another person eating meals?: None Help from another person taking care of personal grooming?: None Help from another person toileting, which includes using toliet, bedpan, or urinal?: A Lot Help from another person bathing (including washing, rinsing, drying)?: A Lot Help from another person to put on and taking off regular upper body clothing?: A Little Help from another person to put on and taking off regular lower body clothing?: A Little 6 Click Score: 18   End of Session Equipment Utilized During Treatment: Oxygen Nurse Communication: Mobility status  Activity Tolerance: Patient limited by fatigue Patient left: with call bell/phone within reach;in bed;with bed alarm set  OT Visit Diagnosis: Unsteadiness on feet (R26.81);History of falling (Z91.81);Other abnormalities of gait and mobility (R26.89);Repeated falls (R29.6) Pain - Right/Left: Left                Time: 2956-2130 OT Time Calculation (min): 21 min Charges:  OT General Charges $OT Visit: 1  Visit OT Evaluation $OT Eval Moderate Complexity: 1 Mod OT Treatments $Self Care/Home Management : 8-22 mins  Jackquline Denmark, MS, OTR/L , CBIS ascom [phone]  04/18/23, 2:40 PM

## 2023-04-18 NOTE — ED Notes (Signed)
 Pt incontinent to bladder. Pt cleaned, new sheets and gown provided. Rash noted under pt breast. Provider notified at this time.

## 2023-04-18 NOTE — ED Notes (Signed)
 73 yof lying supine in the bed with her head elevated. The pt is warm, pink, and dry. The pt is alert and oriented to some questions. The pt did wake up once I entered the room and advised her breathing was a little better.

## 2023-04-18 NOTE — Progress Notes (Signed)
 [facility] Progress Note  Patient: [name]    VHQ:[id number]  DOA: 04/17/2023     Date of Service: the patient was seen and examined on 04/18/2023  Chief Complaint  Patient presents with   Fall   Brief hospital course: [name] is a 73 y.o. female with medical history significant of hypertension, hypothyroidism, COPD, GERD, arthritis, psoriasis on monoclonal antibody injections, anxiety disorder, peripheral vascular disease, status post recent laminectomy on 02/2023 presenting with fall, hip pain, acute respiratory failure with hypoxia, COPD exacerbation, RSV, weakness.  Son reports mother with increased work of breathing over the past 2 to 2 days.  Positive cough wheezing and increased bleeding production.  No longer smoking.  Noted to have been admitted February 2025 for issues including metabolic encephalopathy with concern for UTI as well as influenza A.  Per the son, patient is had progressive decline over several months.  Does live at home alone.  Son feels patient being at home alone is a chronic safety concern.  Also with worsening confusion. Had noted fall with no reported head trauma or loss of consciousness.  No reported sick contacts.  No reports of chest pain, abdominal pain, nausea or vomiting.  Son reports prior history of recurrent falls prior to this admission. Presented to the ER afebrile, hemodynamically stable.  Satting well on room air.  White count 7.4, hemoglobin 11.1, platelets 276, creatinine 1.25, glucose 114, RSV positive.  Chest x-ray and R hip plain films grossly stable.  Assessment and Plan:  Acute respiratory failure with hypoxia (HCC) COPD Exacerbation  RSV Decompensated respiratory status now requiring 2 L nasal cannula in the setting of overlapping RSV and COPD exacerbation Chest x-ray grossly stable Noted RSV positive on respiratory screen S/p IV Solu-Medrol, continue prednisone po  DuoNebs q6 IV azithromycin Supplemental oxygen Symptomatic  management Monitor   Lower extremity edema, Bilateral  DC'd IV fluid Started Lasix 40 mg IV twice daily Check BNP Follow TTE   # Fall and generalized Weakness Recurrent falls at home with noted progressive worsening weakness per the son at the bedside Noted positive trauma to the left hip Plain films within normal limits Noted recent laminectomy on 01/23  Progressive worsening functional status at home per report Grossly nonfocal neuroexam Will plan for formal PT OT evaluation Fall precautions Anticipate rehab versus placement Follow iron profile, folate, B12 and vitamin D   Encephalopathy + generalized confusion on presentation in setting of COPD exacerbation, RSV, decompensated resp failure  Grossly nonfocal neuro exam Son reports confusion being subacute vs. Chronic issue- ? Underlying dementia Unclear of general baseline  Will otherwise monitor for now and follow with treatment.    Depression and anxiety Resumed Klonopin 0.5 mg p.o. 3 times daily lower dose due to encephalopathy and confusion Held Celexa and doxepin for now Continue supportive care   Hypothyroidism: Continue Synthroid   Mixed hyperlipidemia: Held statin for now   Benign essential HTN Held Cardizem and lisinopril for now Resumed Coreg 3.125 mg p.o. twice daily with holding parameters Monitor BP titrate medications accordingly    DM (diabetes mellitus), type 2 (HCC) Blood sugar 110s SSI A1c Monitor with steroid use    Body mass index is 32.8 kg/m.  Interventions:  Diet: Carb modified diet and DVT Prophylaxis: Subcutaneous Lovenox   Advance goals of care discussion: Full code  Family Communication: family was present at bedside, at the time of interview.  The pt provided permission to discuss medical plan with the family. Opportunity  was given to ask question and all questions were answered satisfactorily.   Disposition:  Pt is from Home, admitted with Resp failure RSV positive,  multiple falls, still has Resp failure and risk of fall, which precludes a safe discharge. Will need PT/OT eval and possible placement. Discharge to SNF TBD, when stable.  Subjective: No significant events overnight, currently patient is more awake and alert, mild confusion noticed.  Still has some shortness of breath, requiring supplemental O2 admission.  Denied any chest pain or repressing, no nasal complaints.  Physical Exam: General: NAD, lying comfortably Appear in no distress, affect appropriate Eyes: PERRLA ENT: Oral Mucosa Clear, moist  Neck: no JVD,  Cardiovascular: S1 and S2 Present, no Murmur,  Respiratory: Equal air entry bilaterally, mild crackles bilaterally, no significant wheezes Abdomen: Bowel Sound present, Soft and no tenderness,  Skin: no rashes Extremities: 3+ pedal edema, no calf tenderness Neurologic: without any new focal findings Gait not checked due to patient safety concerns  Vitals:   04/18/23 1100 04/18/23 1200 04/18/23 1228 04/18/23 1423  BP: 136/71 (!) 140/72 131/60 135/70  Pulse: 85 84 85 90  Resp: 18  14 18   Temp:   (!) 97.4 F (36.3 C) 97.8 F (36.6 C)  TempSrc:    Oral  SpO2: 97% 96% 98% 98%  Weight:      Height:        Intake/Output Summary (Last 24 hours) at 04/18/2023 1440 Last data filed at 04/18/2023 0149 Gross per 24 hour  Intake 568.8 ml  Output --  Net 568.8 ml   Filed Weights   04/17/23 1116  Weight: 84 kg    Data Reviewed: I have personally reviewed and interpreted daily labs, tele strips, imagings as discussed above. I reviewed all nursing notes, pharmacy notes, vitals, pertinent old records I have discussed plan of care as described above with RN and patient/family.  CBC: Recent Labs  Lab 04/17/23 1306 04/18/23 0416  WBC 7.4 3.1*  NEUTROABS 3.6  --   HGB 11.1* 10.1*  HCT 36.5 33.8*  MCV 95.3 96.0  PLT 276 212   Basic Metabolic Panel: Recent Labs  Lab 04/17/23 1306 04/18/23 0416  NA 140 141  K 3.9 4.2  CL  101 107  CO2 28 30  GLUCOSE 114* 196*  BUN 24* 25*  CREATININE 1.25* 1.15*  CALCIUM 9.4 8.4*  MG  --  1.9  PHOS  --  3.9    Studies: No results found.  Scheduled Meds:  aspirin EC  81 mg Oral Daily   azithromycin  500 mg Oral Daily   carvedilol  3.125 mg Oral BID WC   clonazePAM  0.5 mg Oral TID   enoxaparin (LOVENOX) injection  40 mg Subcutaneous Q24H   furosemide  40 mg Intravenous BID   insulin aspart  0-9 Units Subcutaneous TID WC   ipratropium-albuterol  3 mL Nebulization Q6H   [START ON 04/19/2023] levothyroxine  100 mcg Oral QAC breakfast   nystatin   Topical BID   pantoprazole  40 mg Oral Daily   predniSONE  40 mg Oral Q breakfast   Continuous Infusions: PRN Meds: albuterol, benzonatate, HYDROcodone-acetaminophen, ondansetron **OR** ondansetron (ZOFRAN) IV, mouth rinse  Time spent: 55 minutes  Author: Gillis Santa. MD [facility] 04/18/2023 2:40 PM  To reach On-call, see care teams to locate the attending and reach out to them via [email or web address]. If 7PM-7AM, please contact night-coverage If you still have difficulty reaching the attending provider, please page the [facility] (Director  on Call) for [facility] on amion for assistance.

## 2023-04-19 ENCOUNTER — Inpatient Hospital Stay

## 2023-04-19 ENCOUNTER — Inpatient Hospital Stay
Admit: 2023-04-19 | Discharge: 2023-04-19 | Disposition: A | Discharge status: Home / Self Care | Attending: Student | Admitting: Student

## 2023-04-19 DIAGNOSIS — J9601 Acute respiratory failure with hypoxia: Secondary | ICD-10-CM | POA: Diagnosis not present

## 2023-04-19 LAB — CBC
HCT: 34.7 % — ABNORMAL LOW (ref 36.0–46.0)
Hemoglobin: 10.6 g/dL — ABNORMAL LOW (ref 12.0–15.0)
MCH: 28.7 pg (ref 26.0–34.0)
MCHC: 30.5 g/dL (ref 30.0–36.0)
MCV: 94 fL (ref 80.0–100.0)
Platelets: 247 10*3/uL (ref 150–400)
RBC: 3.69 MIL/uL — ABNORMAL LOW (ref 3.87–5.11)
RDW: 14 % (ref 11.5–15.5)
WBC: 11.2 10*3/uL — ABNORMAL HIGH (ref 4.0–10.5)
nRBC: 0 % (ref 0.0–0.2)

## 2023-04-19 LAB — BASIC METABOLIC PANEL
Anion gap: 7 (ref 5–15)
BUN: 35 mg/dL — ABNORMAL HIGH (ref 8–23)
CO2: 31 mmol/L (ref 22–32)
Calcium: 8.9 mg/dL (ref 8.9–10.3)
Chloride: 101 mmol/L (ref 98–111)
Creatinine, Ser: 1.18 mg/dL — ABNORMAL HIGH (ref 0.44–1.00)
GFR, Estimated: 48 mL/min — ABNORMAL LOW (ref >60)
Glucose, Bld: 165 mg/dL — ABNORMAL HIGH (ref 70–99)
Potassium: 3.8 mmol/L (ref 3.5–5.1)
Sodium: 139 mmol/L (ref 135–145)

## 2023-04-19 LAB — PHOSPHORUS: Phosphorus: 2.9 mg/dL (ref 2.5–4.6)

## 2023-04-19 LAB — VITAMIN D 25 HYDROXY (VIT D DEFICIENCY, FRACTURES): Vit D, 25-Hydroxy: 48.93 ng/mL (ref 30–100)

## 2023-04-19 LAB — GLUCOSE, CAPILLARY
Glucose-Capillary: 162 mg/dL — ABNORMAL HIGH (ref 70–99)
Glucose-Capillary: 141 mg/dL — ABNORMAL HIGH (ref 70–99)
Glucose-Capillary: 197 mg/dL — ABNORMAL HIGH (ref 70–99)
Glucose-Capillary: 180 mg/dL — ABNORMAL HIGH (ref 70–99)

## 2023-04-19 LAB — MAGNESIUM: Magnesium: 2 mg/dL (ref 1.7–2.4)

## 2023-04-19 MED ORDER — CYANOCOBALAMIN 1000 MCG/ML IJ SOLN
1000.0000 ug | Freq: Every day | INTRAMUSCULAR | Status: DC
  Administered 2023-04-19 – 2023-04-23 (×5): 1000 ug via INTRAMUSCULAR
  Filled 2023-04-19 (×5): qty 1

## 2023-04-19 MED ORDER — VITAMIN B-12 1000 MCG PO TABS: 1000.0000 ug | ORAL_TABLET | Freq: Every day | ORAL | Status: DC

## 2023-04-19 MED ORDER — FUROSEMIDE 10 MG/ML IJ SOLN
40.0000 mg | Freq: Two times a day (BID) | INTRAMUSCULAR | Status: DC
  Administered 2023-04-20: 40 mg via INTRAVENOUS
  Filled 2023-04-19: qty 4

## 2023-04-19 NOTE — Progress Notes (Signed)
 Physical Therapy Treatment Patient Details Name: LATORI BEGGS MRN: [id number] DOB: [birth year] Today's Date: 04/19/2023   History of Present Illness 73 y.o. female with medical history significant of hypertension, hypothyroidism, COPD, GERD, arthritis, psoriasis on monoclonal antibody injections, anxiety disorder, peripheral vascular disease, status post recent laminectomy on 02/2023 presenting with fall, hip pain, acute respiratory failure with hypoxia, COPD exacerbation, RSV, weakness.  Son reports mother with increased work of breathing over the past 2 to 2 days.  Positive cough wheezing and increased bleeding production.  No longer smoking.  Noted to have been admitted February 2025 for issues including metabolic encephalopathy with concern for UTI as well as influenza A.  Per the son, patient is had progressive decline over several months.  Does live at home alone.  Son feels patient being at home alone is a chronic safety concern.  Also with worsening confusion. Had noted fall with no reported head trauma or loss of consciousness.  No reported sick contacts.  No reports of chest pain, abdominal pain, nausea or vomiting.    PT Comments  Pt seen for modified PT session for assist with transfers and return to bed with MinA and use of RW for support and balance. Pt required assist for B LE's sit >supine, unable to bridge to reposition in bed or puul self up. Pt positioned on L side for technician arriving to complete ECHO. Will progress functionally once L LE doppler completed and DVT ruled out. Continue per POC. Initial recs for STR remain appropriate.   If plan is discharge home, recommend the following: Assistance with cooking/housework;Direct supervision/assist for medications management;Direct supervision/assist for financial management;Assist for transportation;Help with stairs or ramp for entrance;Supervision due to cognitive status;A lot of help with bathing/dressing/bathroom;A little help with  walking and/or transfers   Can travel by private vehicle     Yes  Equipment Recommendations  None recommended by PT    Recommendations for Other Services       Precautions / Restrictions Precautions Precautions: Fall Restrictions Weight Bearing Restrictions Per Provider Order: No     Mobility  Bed Mobility Overal bed mobility: Needs Assistance Bed Mobility: Sit to Supine, Rolling Rolling: Min assist, Used rails   Supine to sit: Min assist (for LE's)     General bed mobility comments: Assist to reposition in bed, unable to bridge    Transfers Overall transfer level: Needs assistance Equipment used: Rolling walker (2 wheels) Transfers: Sit to/from Stand, Bed to chair/wheelchair/BSC Sit to Stand: Min assist, +2 safety/equipment           General transfer comment: cuing for hand placement, tends to pull on RW; broad BOS, incontinent of urine upon standing    Ambulation/Gait Ambulation/Gait assistance: Contact guard assist Gait Distance (Feet): 3 Feet Assistive device: Rolling walker (2 wheels) Gait Pattern/deviations: Step-to pattern, Decreased step length - right, Decreased step length - left Gait velocity: decr     General Gait Details: Short distance gait chair to bed due to Tech arriving to complete L LE Doppler   Stairs             Wheelchair Mobility     Tilt Bed    Modified Rankin (Stroke Patients Only)       Balance Overall balance assessment: Needs assistance Sitting-balance support: No upper extremity supported, Feet supported Sitting balance-Leahy Scale: Good     Standing balance support: Bilateral upper extremity supported, During functional activity, Reliant on assistive device for balance Standing balance-Leahy Scale: Fair  Communication Communication Communication: No apparent difficulties  Cognition Arousal: Alert Behavior During Therapy: WFL for tasks assessed/performed   PT -  Cognitive impairments: No apparent impairments                       PT - Cognition Comments: pt oriented to self, year, location Following commands: Intact      Cueing Cueing Techniques: Verbal cues  Exercises Other Exercises Other Exercises: Pt educated on role of PT and benefits of progressing mobility once DVT is ruled out    General Comments General comments (skin integrity, edema, etc.): Modified session due to technician arriving to complete L LE Doppler      Pertinent Vitals/Pain Pain Assessment Pain Assessment: No/denies pain    Home Living                          Prior Function            PT Goals (current goals can now be found in the care plan section) Acute Rehab PT Goals Patient Stated Goal: to go to the bathroom    Frequency    Min 2X/week      PT Plan      Co-evaluation              AM-PAC PT "6 Clicks" Mobility   Outcome Measure  Help needed turning from your back to your side while in a flat bed without using bedrails?: A Little Help needed moving from lying on your back to sitting on the side of a flat bed without using bedrails?: A Little Help needed moving to and from a bed to a chair (including a wheelchair)?: A Little Help needed standing up from a chair using your arms (e.g., wheelchair or bedside chair)?: A Little Help needed to walk in hospital room?: A Little Help needed climbing 3-5 steps with a railing? : A Lot 6 Click Score: 17    End of Session Equipment Utilized During Treatment: Gait belt Activity Tolerance: Patient tolerated treatment well Patient left: in chair;with call bell/phone within reach;with nursing/sitter in room Nurse Communication: Mobility status PT Visit Diagnosis: Repeated falls (R29.6);Muscle weakness (generalized) (M62.81);Difficulty in walking, not elsewhere classified (R26.2)     Time: 1252-1310 PT Time Calculation (min) (ACUTE ONLY): 18 min  Charges:    $Therapeutic  Activity: 8-22 mins PT General Charges $$ ACUTE PT VISIT: 1 Visit                    Zadie Cleverly, PTA  Jannet Askew 04/19/2023, 2:27 PM

## 2023-04-19 NOTE — TOC CM/SW Note (Signed)
 CSW attempted call to son [name], left a VM requesting a return call - regarding SNF rec.  Alfonso Ramus, LCSW Transitions of Care Department [phone]

## 2023-04-19 NOTE — [place] FL2 (Signed)
 [place] MEDICAID [id number] LEVEL OF CARE FORM     IDENTIFICATION  Patient Name: [name] Birthdate: Sep 06, 1950 Sex: female Admission Date (Current Location): 04/17/2023  [facility] and [place] Number:  Chiropodist and Address:  [facility], [address], [place], [place] [address]      Provider Number: [id number]  Attending Physician Name and Address:  Gillis Santa, MD  Relative Name and Phone Number:  Dorner,William (Son)  [phone] [facility])    Current Level of Care: Hospital Recommended Level of Care: Skilled Nursing Facility Prior Approval Number:    Date Approved/Denied:   PASRR Number: [id number]  Discharge Plan:      Current Diagnoses: Patient Active Problem List   Diagnosis Date Noted   Acute respiratory failure with hypoxia (HCC) 04/17/2023   Fall 04/17/2023   Encephalopathy 04/17/2023   Acute metabolic encephalopathy 03/25/2023   Lumbar radiculopathy 02/28/2023   Spinal stenosis, lumbar region, with neurogenic claudication 02/04/2023   Lumbar disc herniation with myelopathy 02/04/2023   Psoriasis 07/26/2020   Class 1 obesity due to excess calories with serious comorbidity and body mass index (BMI) of 34.0 to 34.9 in adult 07/26/2020   COPD (chronic obstructive pulmonary disease) (HCC) 10/28/2017   Thoracic aortic atherosclerosis (HCC) 02/12/2017   Osteoarthritis 05/07/2016   Insomnia 01/10/2016   Bilateral carotid artery stenosis 08/22/2015   Benign essential HTN 08/04/2015   Mixed hyperlipidemia 08/04/2015   Atherosclerotic peripheral vascular disease with intermittent claudication (HCC) 08/04/2015   Panic disorder without agoraphobia with moderate panic attacks 07/01/2015   DM (diabetes mellitus), type 2 (HCC) 06/09/2015   Anxiety and depression 06/23/2014   Esophageal reflux 07/01/2009   Hypothyroidism 01/11/2009    Orientation RESPIRATION BLADDER Height & Weight     Self, Place  O2 (2L) External catheter  Weight: 185 lb 3 oz (84 kg) Height:  5\' 3"  (160 cm)  BEHAVIORAL SYMPTOMS/MOOD NEUROLOGICAL BOWEL NUTRITION STATUS        Diet (carb modified)  AMBULATORY STATUS COMMUNICATION OF NEEDS Skin   Limited Assist Verbally Skin abrasions, Bruising                       Personal Care Assistance Level of Assistance  Bathing, Feeding, Dressing Bathing Assistance: Limited assistance Feeding assistance: Limited assistance Dressing Assistance: Limited assistance     Functional Limitations Info             SPECIAL CARE FACTORS FREQUENCY  PT (By licensed PT), OT (By licensed OT)     PT Frequency: 5 times per week OT Frequency: 5 times per week            Contractures      Additional Factors Info  Code Status, Allergies, Isolation Precautions Code Status Info: full Allergies Info: Ciprofloxacin, Gabapentin, Trazodone And Nefazodone, Trazodone, Mucinex (Guaifenesin Er), Oxycodone, Sulfa Antibiotics     Isolation Precautions Info: droplet     Current Medications (04/19/2023):  This is the current hospital active medication list Current Facility-Administered Medications  Medication Dose Route Frequency Provider Last Rate Last Admin   albuterol (PROVENTIL) (2.5 MG/3ML) 0.083% nebulizer solution 2.5 mg  2.5 mg Nebulization Q2H PRN Floydene Flock, MD       aspirin EC tablet 81 mg  81 mg Oral Daily Gillis Santa, MD   81 mg at 04/19/23 0934   benzonatate (TESSALON) capsule 200 mg  200 mg Oral TID PRN Floydene Flock, MD  carvedilol (COREG) tablet 3.125 mg  3.125 mg Oral BID WC Gillis Santa, MD   3.125 mg at 04/19/23 1610   clonazePAM (KLONOPIN) tablet 0.5 mg  0.5 mg Oral TID Gillis Santa, MD   0.5 mg at 04/19/23 0935   cyanocobalamin (VITAMIN B12) injection 1,000 mcg  1,000 mcg Intramuscular Daily Gillis Santa, MD   1,000 mcg at 04/19/23 1324   Followed by   Melene Muller ON 04/26/2023] cyanocobalamin (VITAMIN B12) tablet 1,000 mcg  1,000 mcg Oral Daily Gillis Santa, MD        enoxaparin (LOVENOX) injection 40 mg  40 mg Subcutaneous Q24H Floydene Flock, MD   40 mg at 04/18/23 2155   [START ON 04/20/2023] furosemide (LASIX) injection 40 mg  40 mg Intravenous BID Gillis Santa, MD       HYDROcodone-acetaminophen (NORCO/VICODIN) 5-325 MG per tablet 1 tablet  1 tablet Oral Q4H PRN Floydene Flock, MD       insulin aspart (novoLOG) injection 0-9 Units  0-9 Units Subcutaneous TID WC Floydene Flock, MD   1 Units at 04/19/23 1323   ipratropium-albuterol (DUONEB) 0.5-2.5 (3) MG/3ML nebulizer solution 3 mL  3 mL Nebulization Q6H Floydene Flock, MD   3 mL at 04/19/23 1424   levothyroxine (SYNTHROID) tablet 100 mcg  100 mcg Oral QAC breakfast Gillis Santa, MD   100 mcg at 04/19/23 0507   nystatin (MYCOSTATIN/NYSTOP) topical powder   Topical BID Manuela Schwartz, NP   Given at 04/19/23 0937   ondansetron (ZOFRAN) tablet 4 mg  4 mg Oral Q6H PRN Floydene Flock, MD       Or   ondansetron [facility]) injection 4 mg  4 mg Intravenous Q6H PRN Floydene Flock, MD       Oral care mouth rinse  15 mL Mouth Rinse PRN Gillis Santa, MD       pantoprazole (PROTONIX) EC tablet 40 mg  40 mg Oral Daily Gillis Santa, MD   40 mg at 04/19/23 0935   predniSONE (DELTASONE) tablet 40 mg  40 mg Oral Q breakfast Floydene Flock, MD   40 mg at 04/19/23 0935     Discharge Medications: Please see discharge summary for a list of discharge medications.  Relevant Imaging Results:  Relevant Lab Results:   Additional Information SS #: [id number]  Brigham Cobbins E Senai Kingsley, LCSW

## 2023-04-19 NOTE — Plan of Care (Signed)

## 2023-04-19 NOTE — Progress Notes (Signed)
*  PRELIMINARY RESULTS* Echocardiogram 2D Echocardiogram has been performed.  [name] 04/19/2023, 2:30 PM

## 2023-04-19 NOTE — Plan of Care (Signed)

## 2023-04-19 NOTE — Progress Notes (Signed)
 Occupational Therapy Treatment Patient Details Name: [name] MRN: [id number] DOB: 04/09/1950 Today's Date: 04/19/2023   History of present illness 73 y.o. female with medical history significant of hypertension, hypothyroidism, COPD, GERD, arthritis, psoriasis on monoclonal antibody injections, anxiety disorder, peripheral vascular disease, status post recent laminectomy on 02/2023 presenting with fall, hip pain, acute respiratory failure with hypoxia, COPD exacerbation, RSV, weakness.  Son reports mother with increased work of breathing over the past 2 to 2 days.  Positive cough wheezing and increased bleeding production.  No longer smoking.  Noted to have been admitted February 2025 for issues including metabolic encephalopathy with concern for UTI as well as influenza A.  Per the son, patient is had progressive decline over several months.  Does live at home alone.  Son feels patient being at home alone is a chronic safety concern.  Also with worsening confusion. Had noted fall with no reported head trauma or loss of consciousness.  No reported sick contacts.  No reports of chest pain, abdominal pain, nausea or vomiting.   OT comments  Pt seen for skilled OT this date, session focuses on functional transfers t/f BSC, pericare and LB bathing. Performs sit<>stand transfers with minA, cues for hand placement as she tends to pull backwards on RW unsuccessfully with OT attempting to redirect + educate. Transfers with minA using RW from recliner step pivot to [facility], incontinent of urine while transferring. Performs LB bathing seated on BSC with modA, OT assisting with washing feet and donning socks as pt has difficulties reaching due to body habitus/balance/ROM.  Pt internally/externally distracted this session, perseverating on using phone (attempting to reach for phone outside reach while on [facility], requiring cues to redirect for safety). Pt trying to abandon RW halfway through transfer back to recliner,  requiring modA for safety to sit. Pt left in recliner with lunch arriving, needs in reach, alarm activated. OT will continue to progress for functional gains. Discharge recommendation appropriate.       If plan is discharge home, recommend the following:  A little help with walking and/or transfers;Assist for transportation;Assistance with cooking/housework;A little help with bathing/dressing/bathroom   Equipment Recommendations  Other (comment)       Precautions / Restrictions Precautions Precautions: Fall Precaution Booklet Issued: No Precaution/Restrictions Comments: status post recent laminectomy on 01/23 Restrictions Weight Bearing Restrictions Per Provider Order: No       Mobility Bed Mobility               General bed mobility comments: NT, in recliner start and end of session    Transfers Overall transfer level: Needs assistance Equipment used: Rolling walker (2 wheels) Transfers: Sit to/from Stand, Bed to chair/wheelchair/BSC Sit to Stand: Min assist     Step pivot transfers: Contact guard assist     General transfer comment: cues for hand placement, externally/internally distracted, wide BOS, requires cues for sequencing and task segmentation for ADLs     Balance Overall balance assessment: Needs assistance Sitting-balance support: No upper extremity supported, Feet supported Sitting balance-Leahy Scale: Good     Standing balance support: Bilateral upper extremity supported Standing balance-Leahy Scale: Poor Standing balance comment: static standing fair, pt with BUE support throughout                           ADL either performed or assessed with clinical judgement   ADL Overall ADL's : Needs assistance/impaired  Lower Body Bathing: Moderate assistance;Sit to/from stand       Lower Body Dressing: Moderate assistance Lower Body Dressing Details (indicate cue type and reason): required modA to don socks seated in  recliner Toilet Transfer: BSC/3in1;Rolling walker (2 wheels);Cueing for safety;Cueing for sequencing;Stand-pivot Toilet Transfer Details (indicate cue type and reason): vcs for technique Toileting- Clothing Manipulation and Hygiene: Minimal assistance;Sitting/lateral lean Toileting - Clothing Manipulation Details (indicate cue type and reason): completes seated on BSC     Functional mobility during ADLs: Minimal assistance;Rolling walker (2 wheels) General ADL Comments: Pt requests to use BSC for urine void     Communication Communication Communication: No apparent difficulties   Cognition Arousal: Alert Behavior During Therapy: [facility] for tasks assessed/performed           Attention impairment (select first level of impairment): Alternating attention Executive functioning impairment (select all impairments): Problem solving                   Following commands: Intact        Cueing   Cueing Techniques: Verbal cues             Pertinent Vitals/ Pain       Pain Assessment Pain Assessment: No/denies pain Pain Score: 0-No pain   Frequency  Min 2X/week        Progress Toward Goals  OT Goals(current goals can now be found in the care plan section)  Progress towards OT goals: Progressing toward goals  Acute Rehab OT Goals OT Goal Formulation: With patient/family Time For Goal Achievement: 05/02/23 Potential to Achieve Goals: Fair ADL Goals Pt Will Perform Grooming: with supervision;standing Pt Will Perform Lower Body Dressing: with supervision;sit to/from stand Pt Will Transfer to Toilet: with supervision;ambulating Pt Will Perform Toileting - Clothing Manipulation and hygiene: with supervision;sit to/from stand  Plan         AM-PAC OT "6 Clicks" Daily Activity     Outcome Measure   Help from another person eating meals?: None Help from another person taking care of personal grooming?: None Help from another person toileting, which includes using  toliet, bedpan, or urinal?: A Lot Help from another person bathing (including washing, rinsing, drying)?: A Lot Help from another person to put on and taking off regular upper body clothing?: A Little Help from another person to put on and taking off regular lower body clothing?: A Little 6 Click Score: 18    End of Session Equipment Utilized During Treatment: Oxygen  OT Visit Diagnosis: Unsteadiness on feet (R26.81);History of falling (Z91.81);Other abnormalities of gait and mobility (R26.89);Repeated falls (R29.6)   Activity Tolerance Patient tolerated treatment well   Patient Left with call bell/phone within reach;with chair alarm set;in chair   Nurse Communication Mobility status        Time: 7846-9629 OT Time Calculation (min): 15 min  Charges: OT General Charges $OT Visit: 1 Visit OT Treatments $Self Care/Home Management : 8-22 mins  Orpha Dain L. Breanna Mcdaniel, OTR/L  04/19/23, 2:27 PM

## 2023-04-19 NOTE — TOC Initial Note (Signed)
 Transition of Care [facility]) - Initial/Assessment Note    Patient Details  Name: [name] MRN: [id number] Date of Birth: [birth year]  Transition of Care [facility]) CM/SW Contact:    Jill Cline, LCSW Phone Number: 04/19/2023, 3:19 PM  Clinical Narrative:                 Per chart review, patient is not fully oriented. CSW spoke with patient's son [name]" by phone. Patient lives alone in an apartment complex, son lives in same complex. PT and OT recommend SNF. [name] reports patient went to [facility] recently and they would like patient to return there for STR. He is not sure how many days she has used and was informed of possible copay days.   [name] had questions about LTC for the future. Explained Medicaid and applying through DSS. He verbalized understanding.  Expected Discharge Plan: Skilled Nursing Facility Barriers to Discharge: Continued Medical Work up   Patient Goals and CMS Choice Patient states their goals for this hospitalization and ongoing recovery are:: SNF for STR CMS Medicare.gov Compare Post Acute Care list provided to:: Patient Represenative (must comment) Choice offered to / list presented to : Adult Children      Expected Discharge Plan and Services       Living arrangements for the past 2 months: Apartment                                      Prior Living Arrangements/Services Living arrangements for the past 2 months: Apartment Lives with:: Self Patient language and need for interpreter reviewed:: Yes Do you feel safe going back to the place where you live?: Yes      Need for Family Participation in Patient Care: Yes (Comment) Care giver support system in place?: Yes (comment) Current home services: DME Criminal Activity/Legal Involvement Pertinent to Current Situation/Hospitalization: No - Comment as needed  Activities of Daily Living   ADL Screening (condition at time of admission) Independently performs ADLs?:  No Does the patient have a NEW difficulty with bathing/dressing/toileting/self-feeding that is expected to last >3 days?: Yes (Initiates electronic notice to provider for possible OT consult) Does the patient have a NEW difficulty with getting in/out of bed, walking, or climbing stairs that is expected to last >3 days?: Yes (Initiates electronic notice to provider for possible PT consult) Does the patient have a NEW difficulty with communication that is expected to last >3 days?: No Is the patient deaf or have difficulty hearing?: No Does the patient have difficulty seeing, even when wearing glasses/contacts?: No Does the patient have difficulty concentrating, remembering, or making decisions?: No  Permission Sought/Granted Permission sought to share information with : Facility Industrial/product designer granted to share information with : Yes, Verbal Permission Granted (by son)     Permission granted to share info w AGENCY: Radio broadcast assistant Assessment       Orientation: : Fluctuating Orientation (Suspected and/or reported Sundowners) Alcohol / Substance Use: Not Applicable Psych Involvement: No (comment)  Admission diagnosis:  Weakness [R53.1] Acute respiratory failure with hypoxia (HCC) [J96.01] Patient Active Problem List   Diagnosis Date Noted   Acute respiratory failure with hypoxia (HCC) 04/17/2023   Fall 04/17/2023   Encephalopathy 04/17/2023   Acute metabolic encephalopathy 03/25/2023   Lumbar radiculopathy 02/28/2023   Spinal stenosis, lumbar region, with neurogenic claudication 02/04/2023  Lumbar disc herniation with myelopathy 02/04/2023   Psoriasis 07/26/2020   Class 1 obesity due to excess calories with serious comorbidity and body mass index (BMI) of 34.0 to 34.9 in adult 07/26/2020   COPD (chronic obstructive pulmonary disease) (HCC) 10/28/2017   Thoracic aortic atherosclerosis (HCC) 02/12/2017   Osteoarthritis 05/07/2016   Insomnia  01/10/2016   Bilateral carotid artery stenosis 08/22/2015   Benign essential HTN 08/04/2015   Mixed hyperlipidemia 08/04/2015   Atherosclerotic peripheral vascular disease with intermittent claudication (HCC) 08/04/2015   Panic disorder without agoraphobia with moderate panic attacks 07/01/2015   DM (diabetes mellitus), type 2 (HCC) 06/09/2015   Anxiety and depression 06/23/2014   Esophageal reflux 07/01/2009   Hypothyroidism 01/11/2009   PCP:  Jill Munroe, NP Pharmacy:   [id number] [place], [place] - [address] AT [facility] OF [address] [place] [address] Phone: [phone] Fax: [phone]  [facility] [id number] [place], [place] - [address] [place] [address] Phone: [phone] Fax: [phone]  [facility] [address] [place] [address] Phone: [phone] Fax: [phone]     Social Drivers of Health (SDOH) Social History: SDOH Screenings   Food Insecurity: No Food Insecurity (04/18/2023)  Housing: Low Risk  (04/18/2023)  Transportation Needs: No Transportation Needs (04/18/2023)  Utilities: Not At Risk (04/18/2023)  Alcohol Screen: Low Risk  (08/28/2022)  Depression (PHQ2-9): Low Risk  (02/08/2023)  Financial Resource Strain: Low Risk  (04/27/2022)  Physical Activity: Inactive (04/27/2022)  Social Connections: Moderately Integrated (04/18/2023)  Recent Concern: Social Connections - Moderately Isolated (03/26/2023)  Stress: No Stress Concern Present (04/27/2022)  Tobacco Use: Medium Risk (04/17/2023)   SDOH Interventions:     Readmission Risk Interventions    01/05/2023   12:33 PM  Readmission Risk Prevention Plan  Transportation Screening Complete  PCP or Specialist Appt within 3-5 Days Complete  HRI or Home Care Consult Complete  Social Work Consult for Recovery Care Planning/Counseling Complete  Palliative Care  Screening Not Applicable  Medication Review Oceanographer) Complete

## 2023-04-19 NOTE — Progress Notes (Signed)
 [facility] Progress Note  Patient: [name]    BMW:[id number]  DOA: 04/17/2023     Date of Service: the patient was seen and examined on 04/19/2023  Chief Complaint  Patient presents with   Fall   Brief hospital course: TIAJAH OYSTER is a 73 y.o. female with medical history significant of hypertension, hypothyroidism, COPD, GERD, arthritis, psoriasis on monoclonal antibody injections, anxiety disorder, peripheral vascular disease, status post recent laminectomy on 02/2023 presenting with fall, hip pain, acute respiratory failure with hypoxia, COPD exacerbation, RSV, weakness.  Son reports mother with increased work of breathing over the past 2 to 2 days.  Positive cough wheezing and increased bleeding production.  No longer smoking.  Noted to have been admitted February 2025 for issues including metabolic encephalopathy with concern for UTI as well as influenza A.  Per the son, patient is had progressive decline over several months.  Does live at home alone.  Son feels patient being at home alone is a chronic safety concern.  Also with worsening confusion. Had noted fall with no reported head trauma or loss of consciousness.  No reported sick contacts.  No reports of chest pain, abdominal pain, nausea or vomiting.  Son reports prior history of recurrent falls prior to this admission. Presented to the ER afebrile, hemodynamically stable.  Satting well on room air.  White count 7.4, hemoglobin 11.1, platelets 276, creatinine 1.25, glucose 114, RSV positive.  Chest x-ray and R hip plain films grossly stable.  Assessment and Plan:  Acute respiratory failure with hypoxia COPD Exacerbation  RSV viral infection Decompensated respiratory status now requiring 2 L nasal cannula in the setting of overlapping RSV and COPD exacerbation Chest x-ray grossly stable Noted RSV positive on respiratory screen S/p IV Solu-Medrol, continue prednisone po  DuoNebs q6 S/p IV azithromycin x 2 doses   Supplemental oxygen Symptomatic management Monitor   Lower extremity edema, Bilateral  DC'd IV fluid S/p Lasix 40 mg IV daily, re-eval in am BNP 501 elevated 3/14 edema improved, LLE edema is more than RLE Follow TTE F/u US V. Duplex LLE to r/o DVT?    # Fall and generalized Weakness Recurrent falls at home with noted progressive worsening weakness per the son at the bedside Noted positive trauma to the left hip Plain films within normal limits Noted recent laminectomy on 01/23  Progressive worsening functional status at home per report Grossly nonfocal neuroexam Will plan for formal PT OT evaluation Fall precautions Anticipate rehab versus placement Iron profile, folate, and vitamin D levels within normal range B12 level 228 goal >400, started vitamin B12 injection during hospital stay, followed by oral supplement    Acute metabolic Encephalopathy + generalized confusion on presentation in setting of COPD exacerbation, RSV, decompensated resp failure  Grossly nonfocal neuro exam Son reports confusion being subacute vs. Chronic issue- ? Underlying dementia Unclear of general baseline  Will otherwise monitor for now and follow with treatment.    Depression and anxiety Resumed Klonopin 0.5 mg p.o. 3 times daily lower dose due to encephalopathy and confusion Held Celexa and doxepin for now Continue supportive care   Hypothyroidism: Continue Synthroid   Mixed hyperlipidemia: Held statin for now   Benign essential HTN Held Cardizem and lisinopril for now Resumed Coreg 3.125 mg p.o. twice daily with holding parameters Monitor BP titrate medications accordingly    DM (diabetes mellitus), type 2 (HCC) Blood sugar 110s SSI A1c Monitor with steroid use    Body mass index is 32.8  kg/m.  Interventions:  Diet: Carb modified diet and DVT Prophylaxis: Subcutaneous Lovenox   Advance goals of care discussion: Full code  Family Communication: family was present at  bedside, at the time of interview.  The pt provided permission to discuss medical plan with the family. Opportunity was given to ask question and all questions were answered satisfactorily.   Disposition:  Pt is from Home, admitted with Resp failure RSV positive, multiple falls, still has Resp failure and risk of fall, which precludes a safe discharge. Will need PT/OT eval and possible placement. Discharge to SNF TBD, when stable.  Subjective: No significant events overnight, patient sitting comfortably on the recliner.  She is making urine after getting Lasix IV, denied any chest pain or palpitations, no any other complaints.   Physical Exam: General: NAD, lying comfortably Appear in no distress, affect appropriate Eyes: PERRLA ENT: Oral Mucosa Clear, moist  Neck: no JVD,  Cardiovascular: S1 and S2 Present, no Murmur,  Respiratory: Equal air entry bilaterally, mild bibasilar crackles, no significant wheezes Abdomen: Bowel Sound present, Soft and no tenderness,  Skin: no rashes Extremities: RLE 1 + and LLE 2--3+ edema, no calf tenderness Neurologic: without any new focal findings Gait not checked due to patient safety concerns  Vitals:   04/19/23 0203 04/19/23 0500 04/19/23 0747 04/19/23 0937  BP:  136/69 125/65 (!) 127/53  Pulse:  89 89 76  Resp:  18 15   Temp:  98.3 F (36.8 C)    TempSrc:      SpO2: 92% 96% 95%   Weight:      Height:        Intake/Output Summary (Last 24 hours) at 04/19/2023 1310 Last data filed at 04/19/2023 1100 Gross per 24 hour  Intake 240 ml  Output 1650 ml  Net -1410 ml   Filed Weights   04/17/23 1116  Weight: 84 kg    Data Reviewed: I have personally reviewed and interpreted daily labs, tele strips, imagings as discussed above. I reviewed all nursing notes, pharmacy notes, vitals, pertinent old records I have discussed plan of care as described above with RN and patient/family.  CBC: Recent Labs  Lab 04/17/23 1306 04/18/23 0416  04/19/23 0436  WBC 7.4 3.1* 11.2*  NEUTROABS 3.6  --   --   HGB 11.1* 10.1* 10.6*  HCT 36.5 33.8* 34.7*  MCV 95.3 96.0 94.0  PLT 276 212 247   Basic Metabolic Panel: Recent Labs  Lab 04/17/23 1306 04/18/23 0416 04/19/23 0436  NA 140 141 139  K 3.9 4.2 3.8  CL 101 107 101  CO2 28 30 31   GLUCOSE 114* 196* 165*  BUN 24* 25* 35*  CREATININE 1.25* 1.15* 1.18*  CALCIUM 9.4 8.4* 8.9  MG  --  1.9 2.0  PHOS  --  3.9 2.9    Studies: No results found.  Scheduled Meds:  aspirin EC  81 mg Oral Daily   carvedilol  3.125 mg Oral BID WC   clonazePAM  0.5 mg Oral TID   cyanocobalamin  1,000 mcg Intramuscular Daily   Followed by   Melene Muller ON 04/26/2023] vitamin B-12  1,000 mcg Oral Daily   enoxaparin (LOVENOX) injection  40 mg Subcutaneous Q24H   [START ON 04/20/2023] furosemide  40 mg Intravenous BID   insulin aspart  0-9 Units Subcutaneous TID WC   ipratropium-albuterol  3 mL Nebulization Q6H   levothyroxine  100 mcg Oral QAC breakfast   nystatin   Topical BID   pantoprazole  40 mg Oral Daily   predniSONE  40 mg Oral Q breakfast   Continuous Infusions: PRN Meds: albuterol, benzonatate, HYDROcodone-acetaminophen, ondansetron **OR** ondansetron (ZOFRAN) IV, mouth rinse  Time spent: 55 minutes  Author: Gillis Santa. MD [facility] 04/19/2023 1:10 PM  To reach On-call, see care teams to locate the attending and reach out to them via [email or web address]. If 7PM-7AM, please contact night-coverage If you still have difficulty reaching the attending provider, please page the [facility] (Director on Call) for [facility] on amion for assistance.

## 2023-04-20 DIAGNOSIS — J9601 Acute respiratory failure with hypoxia: Secondary | ICD-10-CM | POA: Diagnosis not present

## 2023-04-20 LAB — CBC
HCT: 33.2 % — ABNORMAL LOW (ref 36.0–46.0)
Hemoglobin: 10.4 g/dL — ABNORMAL LOW (ref 12.0–15.0)
MCH: 28.7 pg (ref 26.0–34.0)
MCHC: 31.3 g/dL (ref 30.0–36.0)
MCV: 91.5 fL (ref 80.0–100.0)
Platelets: 233 10*3/uL (ref 150–400)
RBC: 3.63 MIL/uL — ABNORMAL LOW (ref 3.87–5.11)
RDW: 14.2 % (ref 11.5–15.5)
WBC: 8.9 10*3/uL (ref 4.0–10.5)
nRBC: 0 % (ref 0.0–0.2)

## 2023-04-20 LAB — ECHOCARDIOGRAM COMPLETE
AR max vel: 2.34 cm2
AV Area VTI: 2.51 cm2
AV Area mean vel: 2.22 cm2
AV Mean grad: 3 mmHg
AV Peak grad: 6.5 mmHg
Ao pk vel: 1.27 m/s
Area-P 1/2: 5.02 cm2
Calc EF: 62.7 %
Height: 63 in
MV VTI: 2.22 cm2
S' Lateral: 2.2 cm
Single Plane A2C EF: 57.3 %
Single Plane A4C EF: 66.2 %
Weight: 2962.98 [oz_av]

## 2023-04-20 LAB — GLUCOSE, CAPILLARY
Glucose-Capillary: 189 mg/dL — ABNORMAL HIGH (ref 70–99)
Glucose-Capillary: 117 mg/dL — ABNORMAL HIGH (ref 70–99)
Glucose-Capillary: 251 mg/dL — ABNORMAL HIGH (ref 70–99)
Glucose-Capillary: 212 mg/dL — ABNORMAL HIGH (ref 70–99)

## 2023-04-20 LAB — BASIC METABOLIC PANEL
Anion gap: 6 (ref 5–15)
BUN: 43 mg/dL — ABNORMAL HIGH (ref 8–23)
CO2: 32 mmol/L (ref 22–32)
Calcium: 8.7 mg/dL — ABNORMAL LOW (ref 8.9–10.3)
Chloride: 99 mmol/L (ref 98–111)
Creatinine, Ser: 1.16 mg/dL — ABNORMAL HIGH (ref 0.44–1.00)
GFR, Estimated: 49 mL/min — ABNORMAL LOW (ref >60)
Glucose, Bld: 137 mg/dL — ABNORMAL HIGH (ref 70–99)
Potassium: 3.3 mmol/L — ABNORMAL LOW (ref 3.5–5.1)
Sodium: 137 mmol/L (ref 135–145)

## 2023-04-20 LAB — PHOSPHORUS: Phosphorus: 2.8 mg/dL (ref 2.5–4.6)

## 2023-04-20 LAB — MAGNESIUM: Magnesium: 2.3 mg/dL (ref 1.7–2.4)

## 2023-04-20 MED ORDER — POTASSIUM CHLORIDE CRYS ER 20 MEQ PO TBCR
40.0000 meq | EXTENDED_RELEASE_TABLET | Freq: Once | ORAL | Status: AC
  Administered 2023-04-20: 40 meq via ORAL
  Filled 2023-04-20: qty 2

## 2023-04-20 MED ORDER — FUROSEMIDE 10 MG/ML IJ SOLN
40.0000 mg | Freq: Every day | INTRAMUSCULAR | Status: DC
  Administered 2023-04-21 – 2023-04-22 (×2): 40 mg via INTRAVENOUS
  Filled 2023-04-20 (×2): qty 4

## 2023-04-20 MED ORDER — IPRATROPIUM-ALBUTEROL 0.5-2.5 (3) MG/3ML IN SOLN
3.0000 mL | Freq: Three times a day (TID) | RESPIRATORY_TRACT | Status: DC
  Administered 2023-04-20: 3 mL via RESPIRATORY_TRACT
  Filled 2023-04-20: qty 3

## 2023-04-20 MED ORDER — ACETAMINOPHEN 325 MG PO TABS
650.0000 mg | ORAL_TABLET | Freq: Four times a day (QID) | ORAL | Status: DC | PRN
Start: 2023-04-20 — End: 2023-04-23
  Administered 2023-04-20 – 2023-04-22 (×2): 650 mg via ORAL
  Filled 2023-04-20 (×2): qty 2

## 2023-04-20 NOTE — TOC Progression Note (Addendum)
 Transition of Care [facility]) - Progression Note    Patient Details  Name: [name] MRN: [id number] Date of Birth: [birth year]  Transition [facility] Care [facility]) CM/SW Contact  Liliana Cline, LCSW Phone Number: 04/20/2023, 9:27 AM  Clinical Narrative:    Per Tobi Bastos at [facility], patient is in her copay days. To return to [facility] for [employer], would need to pay $2996 upfront. CSW attempted call to son to discuss - left VM.   11:33- Return call from son. He verbalized understanding about the copays. He wants to think about it over the weekend, but understands if patient is medically ready on Monday he would need to have a decision. He is leaning towards SNF and paying the copays at [facility] - who reported they cannot take patient until Monday at the earliest.  He also plans to go to DSS Monday to start a Medicaid application.    Expected Discharge Plan: Skilled Nursing Facility Barriers to Discharge: Continued Medical Work up  Expected Discharge Plan and Services       Living arrangements for the past 2 months: Apartment                                       Social Determinants of Health (SDOH) Interventions SDOH Screenings   Food Insecurity: No Food Insecurity (04/18/2023)  Housing: Low Risk  (04/18/2023)  Transportation Needs: No Transportation Needs (04/18/2023)  Utilities: Not At Risk (04/18/2023)  Alcohol Screen: Low Risk  (08/28/2022)  Depression (PHQ2-9): Low Risk  (02/08/2023)  Financial Resource Strain: Low Risk  (04/27/2022)  Physical Activity: Inactive (04/27/2022)  Social Connections: Moderately Integrated (04/18/2023)  Recent Concern: Social Connections - Moderately Isolated (03/26/2023)  Stress: No Stress Concern Present (04/27/2022)  Tobacco Use: Medium Risk (04/17/2023)    Readmission Risk Interventions    01/05/2023   12:33 PM  Readmission Risk Prevention Plan  Transportation Screening Complete  PCP or Specialist Appt within 3-5 Days Complete  HRI  or Home Care Consult Complete  Social Work Consult for Recovery Care Planning/Counseling Complete  Palliative Care Screening Not Applicable  Medication Review Oceanographer) Complete

## 2023-04-20 NOTE — Plan of Care (Signed)

## 2023-04-20 NOTE — Progress Notes (Addendum)
 [facility] Progress Note  Patient: [name]    AOZ:[id number]  DOA: 04/17/2023     Date of Service: the patient was seen and examined on 04/20/2023  Chief Complaint  Patient presents with   Fall   Brief hospital course: [name] is a 73 y.o. female with medical history significant of hypertension, hypothyroidism, COPD, GERD, arthritis, psoriasis on monoclonal antibody injections, anxiety disorder, peripheral vascular disease, status post recent laminectomy on 02/2023 presenting with fall, hip pain, acute respiratory failure with hypoxia, COPD exacerbation, RSV, weakness.  Son reports mother with increased work of breathing over the past 2 to 2 days.  Positive cough wheezing and increased bleeding production.  No longer smoking.  Noted to have been admitted February 2025 for issues including metabolic encephalopathy with concern for UTI as well as influenza A.  Per the son, patient is had progressive decline over several months.  Does live at home alone.  Son feels patient being at home alone is a chronic safety concern.  Also with worsening confusion. Had noted fall with no reported head trauma or loss of consciousness.  No reported sick contacts.  No reports of chest pain, abdominal pain, nausea or vomiting.  Son reports prior history of recurrent falls prior to this admission. Presented to the ER afebrile, hemodynamically stable.  Satting well on room air.  White count 7.4, hemoglobin 11.1, platelets 276, creatinine 1.25, glucose 114, RSV positive.  Chest x-ray and R hip plain films grossly stable.  Assessment and Plan:  # Acute respiratory failure with hypoxia # COPD Exacerbation  # RSV viral infection Decompensated respiratory status now requiring 2 L nasal cannula in the setting of overlapping RSV and COPD exacerbation Chest x-ray grossly stable Noted RSV positive on respiratory screen S/p IV Solu-Medrol, continue prednisone po 40 x 4 days DuoNebs q6 S/p IV azithromycin x 2  doses  Supplemental oxygen Symptomatic management Monitor RN was advised to gradually wean off oxygen   # Lower extremity edema, Bilateral  DC'd IV fluid S/p Lasix 40 mg IV daily, re-eval in am  BNP 501 elevated 3/14 edema improved, LLE edema is more than RLE TTE LVEF 60-65%, no WMA, and no any other significant abnormality [name] V. Duplex LLE negative for DVT   # Hypokalemia due to diuresis. Potassium repleted. Monitor electrolytes and replete as needed.  # Fall and generalized Weakness Recurrent falls at home with noted progressive worsening weakness per the son at the bedside Noted positive trauma to the left hip Plain films within normal limits Noted recent laminectomy on 01/23  Progressive worsening functional status at home per report Grossly nonfocal neuroexam Will plan for formal PT OT evaluation Fall precautions Anticipate rehab versus placement Iron profile, folate, and vitamin D levels within normal range B12 level 228 goal >400, started vitamin B12 injection during hospital stay, followed by oral supplement    # Acute metabolic Encephalopathy + generalized confusion on presentation in setting of COPD exacerbation, RSV, decompensated resp failure  Grossly nonfocal neuro exam Son reports confusion being subacute vs. Chronic issue- ? Underlying dementia Unclear of general baseline  Will otherwise monitor for now and follow with treatment.    # Depression and anxiety Resumed Klonopin 0.5 mg p.o. 3 times daily lower dose due to encephalopathy and confusion Held Celexa and doxepin for now Continue supportive care   # Hypothyroidism: Continue Synthroid   # Mixed hyperlipidemia: Held statin for now   # Benign essential HTN Held Cardizem and lisinopril for  now Resumed Coreg 3.125 mg p.o. twice daily with holding parameters Monitor BP titrate medications accordingly    # DM (diabetes mellitus), type 2 (HCC) Blood sugar 110s SSI A1c Monitor with steroid use     Body mass index is 32.8 kg/m.  Interventions:  Diet: Carb modified diet and DVT Prophylaxis: Subcutaneous Lovenox   Advance goals of care discussion: Full code  Family Communication: family was not present at bedside, at the time of interview.  The pt provided permission to discuss medical plan with the family. Opportunity was given to ask question and all questions were answered satisfactorily.   Disposition:  Pt is from Home, admitted with Resp failure RSV positive, multiple falls, still has Resp failure and risk of fall, which precludes a safe discharge. PT/OT eval done rec SNF placement.  Discharge to SNF, when stable, most likely most likely in 1-2 days.  Subjective: No significant events overnight, patient was sitting comfortably on the recliner.  She was able to walk to the bathroom, and making urine. Her breathing is getting better, denied any chest pain or palpitations, no any other complaints.   Physical Exam: General: NAD, lying comfortably Appear in no distress, affect appropriate Eyes: PERRLA ENT: Oral Mucosa Clear, moist  Neck: no JVD,  Cardiovascular: S1 and S2 Present, no Murmur,  Respiratory: Equal air entry bilaterally, mild bibasilar crackles, no significant wheezes Abdomen: Bowel Sound present, Soft and no tenderness,  Skin: no rashes Extremities: RLE 1 + and LLE 2+ edema, no calf tenderness. Edema is improving  Neurologic: without any new focal findings Gait not checked due to patient safety concerns  Vitals:   04/20/23 0358 04/20/23 0414 04/20/23 0734 04/20/23 0831  BP: (!) 127/57 (!) 110/45  (!) 145/66  Pulse: 77 86  97  Resp: 20 20  18   Temp: 97.8 F (36.6 C) 98.2 F (36.8 C)  97.9 F (36.6 C)  TempSrc:      SpO2: 99% 94% 93% 90%  Weight:      Height:        Intake/Output Summary (Last 24 hours) at 04/20/2023 1206 Last data filed at 04/20/2023 0830 Gross per 24 hour  Intake 480 ml  Output --  Net 480 ml   Filed Weights   04/17/23 1116   Weight: 84 kg    Data Reviewed: I have personally reviewed and interpreted daily labs, tele strips, imagings as discussed above. I reviewed all nursing notes, pharmacy notes, vitals, pertinent old records I have discussed plan of care as described above with RN and patient/family.  CBC: Recent Labs  Lab 04/17/23 1306 04/18/23 0416 04/19/23 0436 04/20/23 0453  WBC 7.4 3.1* 11.2* 8.9  NEUTROABS 3.6  --   --   --   HGB 11.1* 10.1* 10.6* 10.4*  HCT 36.5 33.8* 34.7* 33.2*  MCV 95.3 96.0 94.0 91.5  PLT 276 212 247 233   Basic Metabolic Panel: Recent Labs  Lab 04/17/23 1306 04/18/23 0416 04/19/23 0436 04/20/23 0453  NA 140 141 139 137  K 3.9 4.2 3.8 3.3*  CL 101 107 101 99  CO2 28 30 31  32  GLUCOSE 114* 196* 165* 137*  BUN 24* 25* 35* 43*  CREATININE 1.25* 1.15* 1.18* 1.16*  CALCIUM 9.4 8.4* 8.9 8.7*  MG  --  1.9 2.0 2.3  PHOS  --  3.9 2.9 2.8    Studies: ECHOCARDIOGRAM COMPLETE Result Date: 04/20/2023    ECHOCARDIOGRAM REPORT   Patient Name:   AUDERY WASSENAAR Date of  Exam: 04/19/2023 Medical Rec #:  [id number]   Height:       63.0 in Accession #:    [id number]  Weight:       185.2 lb Date of Birth:  December 14, 1950   BSA:          1.871 m Patient Age:    73 years    BP:           125/68 mmHg Patient Gender: F           HR:           84 bpm. Exam Location:  [facility] Procedure: 2D Echo, Cardiac Doppler, Color Doppler and Strain Analysis (Both            Spectral and Color Flow Doppler were utilized during procedure). Indications:     CHF  History:         Patient has prior history of Echocardiogram examinations, most                  recent 12/02/2017. CHF, COPD; Risk Factors:Hypertension,                  Diabetes and Dyslipidemia.  Sonographer:     Mikki Harbor Referring Phys:  [id number] Gillis Santa Diagnosing Phys: Clotilde Dieter  Sonographer Comments: Image acquisition challenging due to COPD. Global longitudinal strain was attempted. IMPRESSIONS  1. Left ventricular ejection fraction,  by estimation, is 60 to 65%. The left ventricle has normal function. The left ventricle has no regional wall motion abnormalities. Left ventricular diastolic parameters were normal.  2. Right ventricular systolic function is normal. The right ventricular size is normal. There is mildly elevated pulmonary artery systolic pressure.  3. The mitral valve is normal in structure. Trivial mitral valve regurgitation. No evidence of mitral stenosis.  4. The aortic valve is normal in structure. Aortic valve regurgitation is not visualized. No aortic stenosis is present.  5. The inferior vena cava is normal in size with greater than 50% respiratory variability, suggesting right atrial pressure of 3 mmHg. FINDINGS  Left Ventricle: Left ventricular ejection fraction, by estimation, is 60 to 65%. The left ventricle has normal function. The left ventricle has no regional wall motion abnormalities. The left ventricular internal cavity size was normal in size. There is  no left ventricular hypertrophy. Left ventricular diastolic parameters were normal. Right Ventricle: The right ventricular size is normal. No increase in right ventricular wall thickness. Right ventricular systolic function is normal. There is mildly elevated pulmonary artery systolic pressure. The tricuspid regurgitant velocity is 2.72  m/s, and with an assumed right atrial pressure of 8 mmHg, the estimated right ventricular systolic pressure is 37.6 mmHg. Left Atrium: Left atrial size was normal in size. Right Atrium: Right atrial size was normal in size. Pericardium: There is no evidence of pericardial effusion. Mitral Valve: The mitral valve is normal in structure. Trivial mitral valve regurgitation. No evidence of mitral valve stenosis. MV peak gradient, 5.8 mmHg. The mean mitral valve gradient is 3.0 mmHg. Tricuspid Valve: The tricuspid valve is normal in structure. Tricuspid valve regurgitation is mild. Aortic Valve: The aortic valve is normal in structure.  Aortic valve regurgitation is not visualized. No aortic stenosis is present. Aortic valve mean gradient measures 3.0 mmHg. Aortic valve peak gradient measures 6.5 mmHg. Aortic valve area, by VTI measures 2.51 cm. Pulmonic Valve: The pulmonic valve was normal in structure. Pulmonic valve regurgitation is not visualized. Aorta: The aortic root is normal in size  and structure. Venous: The inferior vena cava is normal in size with greater than 50% respiratory variability, suggesting right atrial pressure of 3 mmHg. IAS/Shunts: No atrial level shunt detected by color flow Doppler.  LEFT VENTRICLE PLAX 2D LVIDd:         3.60 cm     Diastology LVIDs:         2.20 cm     LV e' medial:    9.00 cm/s LV PW:         0.90 cm     LV E/e' medial:  10.0 LV IVS:        1.40 cm     LV e' lateral:   8.70 cm/s LVOT diam:     1.80 cm     LV E/e' lateral: 10.3 LV SV:         64 LV SV Index:   34 LVOT Area:     2.54 cm  LV Volumes (MOD) LV vol d, MOD A2C: 45.2 ml LV vol d, MOD A4C: 48.2 ml LV vol s, MOD A2C: 19.3 ml LV vol s, MOD A4C: 16.3 ml LV SV MOD A2C:     25.9 ml LV SV MOD A4C:     48.2 ml LV SV MOD BP:      29.9 ml RIGHT VENTRICLE RV Basal diam:  3.15 cm RV Mid diam:    2.40 cm RV S prime:     12.10 cm/s TAPSE (M-mode): 2.0 cm LEFT ATRIUM             Index        RIGHT ATRIUM           Index LA diam:        4.00 cm 2.14 cm/m   RA Area:     14.50 cm LA Vol (A2C):   44.8 ml 23.94 ml/m  RA Volume:   34.00 ml  18.17 ml/m LA Vol (A4C):   44.8 ml 23.94 ml/m LA Biplane Vol: 44.5 ml 23.78 ml/m  AORTIC VALVE                    PULMONIC VALVE AV Area (Vmax):    2.34 cm     PV Vmax:       1.19 m/s AV Area (Vmean):   2.22 cm     PV Peak grad:  5.7 mmHg AV Area (VTI):     2.51 cm AV Vmax:           127.00 cm/s AV Vmean:          83.300 cm/s AV VTI:            0.257 m AV Peak Grad:      6.5 mmHg AV Mean Grad:      3.0 mmHg LVOT Vmax:         117.00 cm/s LVOT Vmean:        72.800 cm/s LVOT VTI:          0.253 m LVOT/AV VTI ratio: 0.98   AORTA Ao Root diam: 3.40 cm MITRAL VALVE                TRICUSPID VALVE MV Area (PHT): 5.02 cm     TR Peak grad:   29.6 mmHg MV Area VTI:   2.22 cm     TR Vmax:        272.00 cm/s MV Peak grad:  5.8 mmHg MV Mean grad:  3.0 mmHg  SHUNTS MV Vmax:       1.20 m/s     Systemic VTI:  0.25 m MV Vmean:      72.5 cm/s    Systemic Diam: 1.80 cm MV Decel Time: 151 msec MV E velocity: 89.60 cm/s MV A velocity: 110.00 cm/s MV E/A ratio:  0.81 Sabina Custovic Electronically signed by Clotilde Dieter Signature Date/Time: 04/20/2023/11:26:07 AM    Final    US Venous Img Lower Unilateral Left (DVT) Result Date: 04/19/2023 CLINICAL DATA:  Left lower extremity edema. EXAM: LEFT LOWER EXTREMITY VENOUS DOPPLER ULTRASOUND TECHNIQUE: Gray-scale sonography with graded compression, as well as color Doppler and duplex ultrasound were performed to evaluate the lower extremity deep venous systems from the level of the common femoral vein and including the common femoral, femoral, profunda femoral, popliteal and calf veins including the posterior tibial, peroneal and gastrocnemius veins when visible. The superficial great saphenous vein was also interrogated. Spectral Doppler was utilized to evaluate flow at rest and with distal augmentation maneuvers in the common femoral, femoral and popliteal veins. COMPARISON:  Prior study on 03/25/2023 FINDINGS: Contralateral Common Femoral Vein: Respiratory phasicity is normal and symmetric with the symptomatic side. No evidence of thrombus. Normal compressibility. Common Femoral Vein: No evidence of thrombus. Normal compressibility, respiratory phasicity and response to augmentation. Saphenofemoral Junction: No evidence of thrombus. Normal compressibility and flow on color Doppler imaging. Profunda Femoral Vein: No evidence of thrombus. Normal compressibility and flow on color Doppler imaging. Femoral Vein: No evidence of thrombus. Normal compressibility, respiratory phasicity and response to  augmentation. Popliteal Vein: No evidence of thrombus. Normal compressibility, respiratory phasicity and response to augmentation. Calf Veins: No evidence of thrombus. Normal compressibility and flow on color Doppler imaging. Superficial Great Saphenous Vein: No evidence of thrombus. Normal compressibility. Venous Reflux:  None. Other Findings: No evidence of superficial thrombophlebitis or abnormal fluid collection. IMPRESSION: No evidence of left lower extremity deep venous thrombosis. Electronically Signed   By: Irish Lack M.D.   On: 04/19/2023 16:04    Scheduled Meds:  aspirin EC  81 mg Oral Daily   carvedilol  3.125 mg Oral BID WC   clonazePAM  0.5 mg Oral TID   cyanocobalamin  1,000 mcg Intramuscular Daily   Followed by   Melene Muller ON 04/26/2023] vitamin B-12  1,000 mcg Oral Daily   enoxaparin (LOVENOX) injection  40 mg Subcutaneous Q24H   [START ON 04/21/2023] furosemide  40 mg Intravenous Daily   insulin aspart  0-9 Units Subcutaneous TID WC   levothyroxine  100 mcg Oral QAC breakfast   nystatin   Topical BID   pantoprazole  40 mg Oral Daily   predniSONE  40 mg Oral Q breakfast   Continuous Infusions: PRN Meds: albuterol, benzonatate, HYDROcodone-acetaminophen, ondansetron **OR** ondansetron (ZOFRAN) IV, mouth rinse  Time spent: 40 minutes  Author: Gillis Santa. MD [facility] 04/20/2023 12:06 PM  To reach On-call, see care teams to locate the attending and reach out to them via [email or web address]. If 7PM-7AM, please contact night-coverage If you still have difficulty reaching the attending provider, please page the [facility] (Director on Call) for [facility] on amion for assistance.

## 2023-04-21 DIAGNOSIS — J9601 Acute respiratory failure with hypoxia: Secondary | ICD-10-CM | POA: Diagnosis not present

## 2023-04-21 LAB — BASIC METABOLIC PANEL
Anion gap: 6 (ref 5–15)
BUN: 44 mg/dL — ABNORMAL HIGH (ref 8–23)
CO2: 34 mmol/L — ABNORMAL HIGH (ref 22–32)
Calcium: 8.7 mg/dL — ABNORMAL LOW (ref 8.9–10.3)
Chloride: 99 mmol/L (ref 98–111)
Creatinine, Ser: 1.08 mg/dL — ABNORMAL HIGH (ref 0.44–1.00)
GFR, Estimated: 54 mL/min — ABNORMAL LOW (ref >60)
Glucose, Bld: 132 mg/dL — ABNORMAL HIGH (ref 70–99)
Potassium: 3.6 mmol/L (ref 3.5–5.1)
Sodium: 139 mmol/L (ref 135–145)

## 2023-04-21 LAB — GLUCOSE, CAPILLARY
Glucose-Capillary: 112 mg/dL — ABNORMAL HIGH (ref 70–99)
Glucose-Capillary: 177 mg/dL — ABNORMAL HIGH (ref 70–99)
Glucose-Capillary: 231 mg/dL — ABNORMAL HIGH (ref 70–99)
Glucose-Capillary: 129 mg/dL — ABNORMAL HIGH (ref 70–99)

## 2023-04-21 LAB — CBC
HCT: 37.6 % (ref 36.0–46.0)
Hemoglobin: 11.6 g/dL — ABNORMAL LOW (ref 12.0–15.0)
MCH: 29 pg (ref 26.0–34.0)
MCHC: 30.9 g/dL (ref 30.0–36.0)
MCV: 94 fL (ref 80.0–100.0)
Platelets: 214 10*3/uL (ref 150–400)
RBC: 4 MIL/uL (ref 3.87–5.11)
RDW: 14 % (ref 11.5–15.5)
WBC: 7.7 10*3/uL (ref 4.0–10.5)
nRBC: 0 % (ref 0.0–0.2)

## 2023-04-21 LAB — MAGNESIUM: Magnesium: 2.5 mg/dL — ABNORMAL HIGH (ref 1.7–2.4)

## 2023-04-21 LAB — PHOSPHORUS: Phosphorus: 3 mg/dL (ref 2.5–4.6)

## 2023-04-21 MED ORDER — POTASSIUM CHLORIDE CRYS ER 20 MEQ PO TBCR
40.0000 meq | EXTENDED_RELEASE_TABLET | Freq: Once | ORAL | Status: AC
  Administered 2023-04-21: 40 meq via ORAL
  Filled 2023-04-21: qty 2

## 2023-04-21 NOTE — Progress Notes (Addendum)
 [facility] Progress Note  Patient: [name]    WGN:[id number]  DOA: 04/17/2023     Date of Service: the patient was seen and examined on 04/21/2023  Chief Complaint  Patient presents with   Fall   Brief hospital course: [name] is a 73 y.o. female with medical history significant of hypertension, hypothyroidism, COPD, GERD, arthritis, psoriasis on monoclonal antibody injections, anxiety disorder, peripheral vascular disease, status post recent laminectomy on 02/2023 presenting with fall, hip pain, acute respiratory failure with hypoxia, COPD exacerbation, RSV, weakness.  Son reports mother with increased work of breathing over the past 2 to 2 days.  Positive cough wheezing and increased bleeding production.  No longer smoking.  Noted to have been admitted February 2025 for issues including metabolic encephalopathy with concern for UTI as well as influenza A.  Per the son, patient is had progressive decline over several months.  Does live at home alone.  Son feels patient being at home alone is a chronic safety concern.  Also with worsening confusion. Had noted fall with no reported head trauma or loss of consciousness.  No reported sick contacts.  No reports of chest pain, abdominal pain, nausea or vomiting.  Son reports prior history of recurrent falls prior to this admission. Presented to the ER afebrile, hemodynamically stable.  Satting well on room air.  White count 7.4, hemoglobin 11.1, platelets 276, creatinine 1.25, glucose 114, RSV positive.  Chest x-ray and R hip plain films grossly stable.  Assessment and Plan:  # Acute respiratory failure with hypoxia # COPD Exacerbation  # RSV viral infection Decompensated respiratory status now requiring 2 L nasal cannula in the setting of overlapping RSV and COPD exacerbation Chest x-ray grossly stable Noted RSV positive on respiratory screen S/p IV Solu-Medrol, continue prednisone po 40 x 4 days DuoNebs q6 S/p IV azithromycin x 2  doses  Supplemental oxygen,  Symptomatic management Monitor RN was advised to gradually wean off oxygen   # Lower extremity edema, Bilateral, Resolved on 3/16 DC'd IV fluid S/p S/p Lasix 40 mg IV daily BNP 501 elevated 3/14 edema improved, LLE edema is more than RLE TTE LVEF 60-65%, no WMA, and no any other significant abnormality [name] V. Duplex LLE negative for DVT 3/16 edema resolved   # Hypokalemia due to diuresis. Potassium repleted. Monitor electrolytes and replete as needed.  # Fall and generalized Weakness Recurrent falls at home with noted progressive worsening weakness per the son at the bedside Noted positive trauma to the left hip Plain films within normal limits Noted recent laminectomy on 01/23  Progressive worsening functional status at home per report Grossly nonfocal neuroexam Will plan for formal PT OT evaluation Continue Fall precautions and Anticipate rehab placement Iron profile, folate, and vitamin D levels within normal range B12 level 228 goal >400, started vitamin B12 injection during hospital stay, followed by oral supplement  Check orthostatics vitals  # Acute metabolic Encephalopathy + generalized confusion on presentation in setting of COPD exacerbation, RSV, decompensated resp failure  Grossly nonfocal neuro exam Son reports confusion being subacute vs. Chronic issue- ? Underlying dementia Unclear of general baseline  Will otherwise monitor for now and follow with treatment.    # Depression and anxiety Resumed Klonopin 0.5 mg p.o. 3 times daily lower dose due to encephalopathy and confusion Held Celexa and doxepin for now Continue supportive care   # Hypothyroidism: Continue Synthroid   # Mixed hyperlipidemia: Held statin for now   # Benign essential  HTN Held Cardizem and lisinopril for now Resumed Coreg 3.125 mg p.o. twice daily with holding parameters Monitor BP titrate medications accordingly    # DM (diabetes mellitus), type 2  (HCC) Blood sugar 110s SSI A1c Monitor with steroid use    Body mass index is 32.8 kg/m.  Interventions:  Diet: Carb modified diet and DVT Prophylaxis: Subcutaneous Lovenox   Advance goals of care discussion: Full code  Family Communication: family was not present at bedside, at the time of interview.  The pt provided permission to discuss medical plan with the family. Opportunity was given to ask question and all questions were answered satisfactorily.   Disposition:  Pt is from Home, admitted with Resp failure RSV positive, multiple falls, still has Resp failure and risk of fall, which precludes a safe discharge. PT/OT eval done rec SNF placement.  Discharge to SNF, when stable, most likely most likely in 1-2 days.  Subjective: No significant events overnight, patient was sitting comfortably on the recliner. C/o Dry cough, advised to ask for prn cough medicine. Edema improved, denies any other complaints  She is trying to ambulate, may be stable to go home in1-2 days   Physical Exam: General: NAD, lying comfortably Appear in no distress, affect appropriate Eyes: PERRLA ENT: Oral Mucosa Clear, moist  Neck: no JVD,  Cardiovascular: S1 and S2 Present, no Murmur,  Respiratory: Equal air entry bilaterally, mild bibasilar crackles, no significant wheezes Abdomen: Bowel Sound present, Soft and no tenderness,  Skin: no rashes Extremities: edema almost resolved, no calf tenderness. Neurologic: without any new focal findings Gait not checked due to patient safety concerns  Vitals:   04/20/23 2156 04/21/23 0355 04/21/23 0841 04/21/23 0943  BP: 112/63 129/71 (!) 142/61 (!) 153/66  Pulse:  72 77 73  Resp:  18  18  Temp:  (!) 97.1 F (36.2 C) 98 F (36.7 C) 98 F (36.7 C)  TempSrc:  Axillary Oral Oral  SpO2: 100% 96% 98% 100%  Weight:      Height:        Intake/Output Summary (Last 24 hours) at 04/21/2023 1419 Last data filed at 04/21/2023 1610 Gross per 24 hour  Intake  680 ml  Output --  Net 680 ml   Filed Weights   04/17/23 1116  Weight: 84 kg    Data Reviewed: I have personally reviewed and interpreted daily labs, tele strips, imagings as discussed above. I reviewed all nursing notes, pharmacy notes, vitals, pertinent old records I have discussed plan of care as described above with RN and patient/family.  CBC: Recent Labs  Lab 04/17/23 1306 04/18/23 0416 04/19/23 0436 04/20/23 0453 04/21/23 0536  WBC 7.4 3.1* 11.2* 8.9 7.7  NEUTROABS 3.6  --   --   --   --   HGB 11.1* 10.1* 10.6* 10.4* 11.6*  HCT 36.5 33.8* 34.7* 33.2* 37.6  MCV 95.3 96.0 94.0 91.5 94.0  PLT 276 212 247 233 214   Basic Metabolic Panel: Recent Labs  Lab 04/17/23 1306 04/18/23 0416 04/19/23 0436 04/20/23 0453 04/21/23 0536  NA 140 141 139 137 139  K 3.9 4.2 3.8 3.3* 3.6  CL 101 107 101 99 99  CO2 28 30 31  32 34*  GLUCOSE 114* 196* 165* 137* 132*  BUN 24* 25* 35* 43* 44*  CREATININE 1.25* 1.15* 1.18* 1.16* 1.08*  CALCIUM 9.4 8.4* 8.9 8.7* 8.7*  MG  --  1.9 2.0 2.3 2.5*  PHOS  --  3.9 2.9 2.8 3.0  Studies: No results found.   Scheduled Meds:  aspirin EC  81 mg Oral Daily   carvedilol  3.125 mg Oral BID WC   clonazePAM  0.5 mg Oral TID   cyanocobalamin  1,000 mcg Intramuscular Daily   Followed by   Melene Muller ON 04/26/2023] vitamin B-12  1,000 mcg Oral Daily   enoxaparin (LOVENOX) injection  40 mg Subcutaneous Q24H   furosemide  40 mg Intravenous Daily   insulin aspart  0-9 Units Subcutaneous TID WC   levothyroxine  100 mcg Oral QAC breakfast   nystatin   Topical BID   pantoprazole  40 mg Oral Daily   Continuous Infusions: PRN Meds: acetaminophen, albuterol, benzonatate, HYDROcodone-acetaminophen, ondansetron **OR** ondansetron (ZOFRAN) IV, mouth rinse  Time spent: 40 minutes  Author: Gillis Santa. MD [facility] 04/21/2023 2:19 PM  To reach On-call, see care teams to locate the attending and reach out to them via [email or web address]. If  7PM-7AM, please contact night-coverage If you still have difficulty reaching the attending provider, please page the [facility] (Director on Call) for [facility] on amion for assistance.

## 2023-04-21 NOTE — Plan of Care (Signed)
 Received report, sitting up in chair, denies pain at this time. Continue to [employer].

## 2023-04-21 NOTE — Plan of Care (Signed)
  Problem: Coping: Goal: Ability to adjust to condition or change in health will improve Outcome: Progressing   Problem: Fluid Volume: Goal: Ability to maintain a balanced intake and output will improve Outcome: Progressing   Problem: Metabolic: Goal: Ability to maintain appropriate glucose levels will improve Outcome: Progressing   Problem: Nutritional: Goal: Maintenance of adequate nutrition will improve Outcome: Progressing Goal: Progress toward achieving an optimal weight will improve Outcome: Progressing   Problem: Skin Integrity: Goal: Risk for impaired skin integrity will decrease Outcome: Progressing   Problem: Activity: Goal: Risk for activity intolerance will decrease Outcome: Progressing   Problem: Nutrition: Goal: Adequate nutrition will be maintained Outcome: Progressing   Problem: Coping: Goal: Level of anxiety will decrease Outcome: Progressing

## 2023-04-22 ENCOUNTER — Encounter: Admitting: Neurosurgery

## 2023-04-22 DIAGNOSIS — J9601 Acute respiratory failure with hypoxia: Secondary | ICD-10-CM | POA: Diagnosis not present

## 2023-04-22 LAB — GLUCOSE, CAPILLARY
Glucose-Capillary: 120 mg/dL — ABNORMAL HIGH (ref 70–99)
Glucose-Capillary: 110 mg/dL — ABNORMAL HIGH (ref 70–99)
Glucose-Capillary: 164 mg/dL — ABNORMAL HIGH (ref 70–99)
Glucose-Capillary: 126 mg/dL — ABNORMAL HIGH (ref 70–99)

## 2023-04-22 LAB — CBC
HCT: 38.9 % (ref 36.0–46.0)
Hemoglobin: 12.1 g/dL (ref 12.0–15.0)
MCH: 28.6 pg (ref 26.0–34.0)
MCHC: 31.1 g/dL (ref 30.0–36.0)
MCV: 92 fL (ref 80.0–100.0)
Platelets: 235 10*3/uL (ref 150–400)
RBC: 4.23 MIL/uL (ref 3.87–5.11)
RDW: 13.8 % (ref 11.5–15.5)
WBC: 8.9 10*3/uL (ref 4.0–10.5)
nRBC: 0 % (ref 0.0–0.2)

## 2023-04-22 LAB — BASIC METABOLIC PANEL
Anion gap: 8 (ref 5–15)
BUN: 46 mg/dL — ABNORMAL HIGH (ref 8–23)
CO2: 36 mmol/L — ABNORMAL HIGH (ref 22–32)
Calcium: 9.2 mg/dL (ref 8.9–10.3)
Chloride: 96 mmol/L — ABNORMAL LOW (ref 98–111)
Creatinine, Ser: 1.19 mg/dL — ABNORMAL HIGH (ref 0.44–1.00)
GFR, Estimated: 48 mL/min — ABNORMAL LOW (ref >60)
Glucose, Bld: 121 mg/dL — ABNORMAL HIGH (ref 70–99)
Potassium: 4.6 mmol/L (ref 3.5–5.1)
Sodium: 140 mmol/L (ref 135–145)

## 2023-04-22 LAB — PHOSPHORUS: Phosphorus: 3.2 mg/dL (ref 2.5–4.6)

## 2023-04-22 LAB — MAGNESIUM: Magnesium: 2.4 mg/dL (ref 1.7–2.4)

## 2023-04-22 MED ORDER — PANTOPRAZOLE SODIUM 40 MG PO TBEC
40.0000 mg | DELAYED_RELEASE_TABLET | Freq: Two times a day (BID) | ORAL | Status: DC
  Administered 2023-04-22 – 2023-04-23 (×2): 40 mg via ORAL
  Filled 2023-04-22 (×2): qty 1

## 2023-04-22 MED ORDER — ALUM & MAG HYDROXIDE-SIMETH 200-200-20 MG/5ML PO SUSP
15.0000 mL | Freq: Four times a day (QID) | ORAL | Status: DC | PRN
  Administered 2023-04-22: 15 mL via ORAL
  Filled 2023-04-22: qty 30

## 2023-04-22 NOTE — Progress Notes (Signed)
 Physical Therapy Treatment Patient Details Name: [name] MRN: [id number] DOB: November 13, 1950 Today's Date: 04/22/2023   History of Present Illness 73 y.o. female with medical history significant of hypertension, hypothyroidism, COPD, GERD, arthritis, psoriasis on monoclonal antibody injections, anxiety disorder, peripheral vascular disease, status post recent laminectomy on 02/2023 presenting with fall, hip pain, acute respiratory failure with hypoxia, COPD exacerbation, RSV, weakness.  Son reports mother with increased work of breathing over the past 2 to 2 days.  Positive cough wheezing and increased bleeding production.  No longer smoking.  Noted to have been admitted February 2025 for issues including metabolic encephalopathy with concern for UTI as well as influenza A.  Per the son, patient is had progressive decline over several months.  Does live at home alone.  Son feels patient being at home alone is a chronic safety concern.  Also with worsening confusion. Had noted fall with no reported head trauma or loss of consciousness.  No reported sick contacts.  No reports of chest pain, abdominal pain, nausea or vomiting.    PT Comments  Patient is agreeable to PT session. Supportive son at the bedside and reporting patient cannot go home alone at discharge. The patient was able to stand several times with cues for rollator management and lifting assistance needed to stand. Hallway ambulation performed with steadying assistance provided using 4 wheeled walker with several standing rest breaks taken due to fatigue. Sp02 97% on room air. Patient education on energy conservation strategies. Recommend rehabilitation < 3 hours/day after this hospital stay.     If plan is discharge home, recommend the following: Assistance with cooking/housework;Direct supervision/assist for medications management;Direct supervision/assist for financial management;Assist for transportation;Help with stairs or ramp for  entrance;Supervision due to cognitive status;A lot of help with bathing/dressing/bathroom;A little help with walking and/or transfers   Can travel by private vehicle     Yes  Equipment Recommendations  None recommended by PT    Recommendations for Other Services       Precautions / Restrictions Precautions Precautions: Fall Precaution Booklet Issued: No Recall of Precautions/Restrictions: Intact Precaution/Restrictions Comments: status post recent laminectomy on 01/23 Restrictions Weight Bearing Restrictions Per Provider Order: No     Mobility  Bed Mobility               General bed mobility comments: not assessed as patient sitting up on arrival and post session    Transfers Overall transfer level: Needs assistance Equipment used: Rollator (4 wheels) Transfers: Sit to/from Stand Sit to Stand: Min assist           General transfer comment: steadying assistance required for standing from recliner and toilet. cues for breaks management of rollator    Ambulation/Gait Ambulation/Gait assistance: Contact guard assist Gait Distance (Feet): 170 Feet Assistive device: Rollator (4 wheels) Gait Pattern/deviations: Step-through pattern Gait velocity: decreased     General Gait Details: several standing rest breaks required with ambulation. occasional steadying assistance provided. encouraged patient to take rest breaks as needed and/or use the rollator for a seated break which was not required. Sp02 97% on room air after walking   Stairs             Wheelchair Mobility     Tilt Bed    Modified Rankin (Stroke Patients Only)       Balance Overall balance assessment: Needs assistance Sitting-balance support: No upper extremity supported, Feet supported Sitting balance-Leahy Scale: Good     Standing balance support: Bilateral upper extremity supported Standing  balance-Leahy Scale: Poor Standing balance comment: external support provided                             Communication Communication Communication: No apparent difficulties  Cognition Arousal: Alert Behavior During Therapy: WFL for tasks assessed/performed   PT - Cognitive impairments: No apparent impairments                         Following commands: Intact      Cueing Cueing Techniques: Verbal cues  Exercises      General Comments        Pertinent Vitals/Pain Pain Assessment Pain Assessment: No/denies pain    Home Living                          Prior Function            PT Goals (current goals can now be found in the care plan section) Acute Rehab PT Goals Patient Stated Goal: to get stronger PT Goal Formulation: With patient Time For Goal Achievement: 05/02/23 Potential to Achieve Goals: Good Progress towards PT goals: Progressing toward goals    Frequency    Min 2X/week      PT Plan      Co-evaluation              AM-PAC PT "6 Clicks" Mobility   Outcome Measure  Help needed turning from your back to your side while in a flat bed without using bedrails?: A Little Help needed moving from lying on your back to sitting on the side of a flat bed without using bedrails?: A Little Help needed moving to and from a bed to a chair (including a wheelchair)?: A Little Help needed standing up from a chair using your arms (e.g., wheelchair or bedside chair)?: A Little Help needed to walk in hospital room?: A Little Help needed climbing 3-5 steps with a railing? : A Lot 6 Click Score: 17    End of Session Equipment Utilized During Treatment: Gait belt Activity Tolerance: Patient tolerated treatment well Patient left: in chair;with call bell/phone within reach;with family/visitor present (son and MD in the room) Nurse Communication: Mobility status PT Visit Diagnosis: Repeated falls (R29.6);Muscle weakness (generalized) (M62.81);Difficulty in walking, not elsewhere classified (R26.2)     Time:  0102-7253 PT Time Calculation (min) (ACUTE ONLY): 19 min  Charges:    $Therapeutic Activity: 8-22 mins PT General Charges $$ ACUTE PT VISIT: 1 Visit                     Donna Bernard, PT, MPT    [name] 04/22/2023, 12:51 PM

## 2023-04-22 NOTE — TOC Progression Note (Signed)
 Transition of Care [facility]) - Progression Note    Patient Details  Name: [name] MRN: [id number] Date of Birth: 1950/09/04  [facility] CM/SW Contact  Chapman Fitch, RN Phone Number: 04/22/2023, 12:30 PM  Clinical Narrative:     Message left for son Chrissie Noa to follow up about paying copays at [employer] vs home health home health  Expected Discharge Plan: Skilled Nursing Facility Barriers to Discharge: Continued Medical Work up  Expected Discharge Plan and Services       Living arrangements for the past 2 months: Apartment                                       Social Determinants of Health (SDOH) Interventions SDOH Screenings   Food Insecurity: No Food Insecurity (04/18/2023)  Housing: Low Risk  (04/18/2023)  Transportation Needs: No Transportation Needs (04/18/2023)  Utilities: Not At Risk (04/18/2023)  Alcohol Screen: Low Risk  (08/28/2022)  Depression (PHQ2-9): Low Risk  (02/08/2023)  Financial Resource Strain: Low Risk  (04/27/2022)  Physical Activity: Inactive (04/27/2022)  Social Connections: Moderately Integrated (04/18/2023)  Recent Concern: Social Connections - Moderately Isolated (03/26/2023)  Stress: No Stress Concern Present (04/27/2022)  Tobacco Use: Medium Risk (04/17/2023)    Readmission Risk Interventions    01/05/2023   12:33 PM  Readmission Risk Prevention Plan  Transportation Screening Complete  PCP or Specialist Appt within 3-5 Days Complete  HRI or Home Care Consult Complete  Social Work Consult for Recovery Care Planning/Counseling Complete  Palliative Care Screening Not Applicable  Medication Review [employer]) Complete

## 2023-04-22 NOTE — Progress Notes (Signed)
 [facility] Progress Note  Patient: [name]    ZOX:[id number]  DOA: 04/17/2023     Date of Service: the patient was seen and examined on 04/22/2023  Chief Complaint  Patient presents with   Fall   Brief hospital course: [name] is a 73 y.o. female with medical history significant of hypertension, hypothyroidism, COPD, GERD, arthritis, psoriasis on monoclonal antibody injections, anxiety disorder, peripheral vascular disease, status post recent laminectomy on 02/2023 presenting with fall, hip pain, acute respiratory failure with hypoxia, COPD exacerbation, RSV, weakness.  Son reports mother with increased work of breathing over the past 2 to 2 days.  Positive cough wheezing and increased bleeding production.  No longer smoking.  Noted to have been admitted February 2025 for issues including metabolic encephalopathy with concern for UTI as well as influenza A.  Per the son, patient is had progressive decline over several months.  Does live at home alone.  Son feels patient being at home alone is a chronic safety concern.  Also with worsening confusion. Had noted fall with no reported head trauma or loss of consciousness.  No reported sick contacts.  No reports of chest pain, abdominal pain, nausea or vomiting.  Son reports prior history of recurrent falls prior to this admission. Presented to the ER afebrile, hemodynamically stable.  Satting well on room air.  White count 7.4, hemoglobin 11.1, platelets 276, creatinine 1.25, glucose 114, RSV positive.  Chest x-ray and R hip plain films grossly stable.  Assessment and Plan:  # Acute respiratory failure with hypoxia # COPD Exacerbation  # RSV viral infection Decompensated respiratory status now requiring 2 L nasal cannula in the setting of overlapping RSV and COPD exacerbation Chest x-ray grossly stable Noted RSV positive on respiratory screen S/p IV Solu-Medrol, continue prednisone po 40 x 4 days DuoNebs q6 S/p IV azithromycin x 2  doses  Supplemental oxygen,  Symptomatic management Monitor RN was advised to gradually wean off oxygen   # Lower extremity edema, Bilateral, Resolved on 3/16 DC'd IV fluid S/p S/p Lasix 40 mg IV daily BNP 501 elevated 3/14 edema improved, LLE edema is more than RLE TTE LVEF 60-65%, no WMA, and no any other significant abnormality [name] V. Duplex LLE negative for DVT 3/16 edema resolved   # Hypokalemia due to diuresis. Potassium repleted. Monitor electrolytes and replete as needed.  # Orthostatic hypotension might be causing frequent Fall and generalized Weakness Recurrent falls at home with noted progressive worsening weakness per the son at the bedside Noted positive trauma to the left hip. Plain films within normal limits Noted recent laminectomy on 01/23, Progressive worsening functional status at home per report Grossly nonfocal neuroexam. PT and OT evaluation done Rec SNF placement but she can not afford to pay upfront the required amount.   Continue Fall precautions and Anticipate rehab placement Iron profile, folate, and vitamin D levels within normal range B12 level 228 goal >400, started vitamin B12 injection during hospital stay, followed by oral supplement    # Acute metabolic Encephalopathy, Resolved + generalized confusion on presentation in setting of COPD exacerbation, RSV, decompensated resp failure. Grossly nonfocal neuro exam Son reports confusion being subacute vs. Chronic issue- ? Underlying dementia Unclear of general baseline  Will otherwise monitor for now and follow with treatment.    # Depression and anxiety Resumed Klonopin 0.5 mg p.o. 3 times daily lower dose due to encephalopathy and confusion Held Celexa and doxepin for now Continue supportive care   #  Hypothyroidism: Continue Synthroid   # Mixed hyperlipidemia: Held statin for now   # Benign essential HTN Held Cardizem and lisinopril for now Resumed Coreg 3.125 mg p.o. twice daily with  holding parameters Monitor BP titrate medications accordingly 3/17 orthostatics positive   # DM (diabetes mellitus), type 2 (HCC) Blood sugar 110s SSI A1c Monitor with steroid use    Body mass index is 32.8 kg/m.  Interventions:  Diet: Carb modified diet and DVT Prophylaxis: Subcutaneous Lovenox   Advance goals of care discussion: Full code  Family Communication: family was not present at bedside, at the time of interview.  The pt provided permission to discuss medical plan with the family. Opportunity was given to ask question and all questions were answered satisfactorily.   Disposition:  Pt is from Home, admitted with Resp failure RSV positive, multiple falls, still has Resp failure and risk of fall, which precludes a safe discharge. PT/OT eval done rec SNF placement.  Discharge to Home with HH, when stable, most likely tomorrow am.  Subjective: No significant events overnight, patient was sitting comfortably on the recliner. Pt did walk with PT and still feels not back to her baseline SOB improved, sat ok on RA   Physical Exam: General: NAD, sitting comfortably Appear in no distress, affect appropriate Eyes: PERRLA ENT: Oral Mucosa Clear, moist  Neck: no JVD,  Cardiovascular: S1 and S2 Present, no Murmur,  Respiratory: Equal air entry bilaterally, mild bibasilar crackles, no wheezes Abdomen: Bowel Sound present, Soft and no tenderness,  Skin: no rashes Extremities: edema almost resolved, no calf tenderness. Neurologic: without any new focal findings Gait not checked due to patient safety concerns  Vitals:   04/22/23 0354 04/22/23 0410 04/22/23 0814 04/22/23 1542  BP: (!) 164/69 (!) 148/70 133/70 (!) 129/49  Pulse: 72  70 71  Resp: 16  17 14   Temp: 97.8 F (36.6 C)  98 F (36.7 C) 98.1 F (36.7 C)  TempSrc: Oral  Oral Oral  SpO2: 98%  97% 100%  Weight:      Height:        Intake/Output Summary (Last 24 hours) at 04/22/2023 1557 Last data filed at  04/22/2023 1100 Gross per 24 hour  Intake 630 ml  Output --  Net 630 ml   Filed Weights   04/17/23 1116  Weight: 84 kg    Data Reviewed: I have personally reviewed and interpreted daily labs, tele strips, imagings as discussed above. I reviewed all nursing notes, pharmacy notes, vitals, pertinent old records I have discussed plan of care as described above with RN and patient/family.  CBC: Recent Labs  Lab 04/17/23 1306 04/18/23 0416 04/19/23 0436 04/20/23 0453 04/21/23 0536 04/22/23 0614  WBC 7.4 3.1* 11.2* 8.9 7.7 8.9  NEUTROABS 3.6  --   --   --   --   --   HGB 11.1* 10.1* 10.6* 10.4* 11.6* 12.1  HCT 36.5 33.8* 34.7* 33.2* 37.6 38.9  MCV 95.3 96.0 94.0 91.5 94.0 92.0  PLT 276 212 247 233 214 235   Basic Metabolic Panel: Recent Labs  Lab 04/18/23 0416 04/19/23 0436 04/20/23 0453 04/21/23 0536 04/22/23 0614  NA 141 139 137 139 140  K 4.2 3.8 3.3* 3.6 4.6  CL 107 101 99 99 96*  CO2 30 31 32 34* 36*  GLUCOSE 196* 165* 137* 132* 121*  BUN 25* 35* 43* 44* 46*  CREATININE 1.15* 1.18* 1.16* 1.08* 1.19*  CALCIUM 8.4* 8.9 8.7* 8.7* 9.2  MG 1.9 2.0  2.3 2.5* 2.4  PHOS 3.9 2.9 2.8 3.0 3.2    Studies: No results found.   Scheduled Meds:  aspirin EC  81 mg Oral Daily   carvedilol  3.125 mg Oral BID WC   clonazePAM  0.5 mg Oral TID   cyanocobalamin  1,000 mcg Intramuscular Daily   Followed by   Melene Muller ON 04/26/2023] vitamin B-12  1,000 mcg Oral Daily   enoxaparin (LOVENOX) injection  40 mg Subcutaneous Q24H   insulin aspart  0-9 Units Subcutaneous TID WC   levothyroxine  100 mcg Oral QAC breakfast   nystatin   Topical BID   pantoprazole  40 mg Oral BID   Continuous Infusions: PRN Meds: acetaminophen, albuterol, alum & mag hydroxide-simeth, benzonatate, HYDROcodone-acetaminophen, ondansetron **OR** ondansetron (ZOFRAN) IV, mouth rinse  Time spent: 40 minutes  Author: Gillis Santa. MD [facility] 04/22/2023 3:57 PM  To reach On-call, see care teams to  locate the attending and reach out to them via [email or web address]. If 7PM-7AM, please contact night-coverage If you still have difficulty reaching the attending provider, please page the [facility] (Director on Call) for [facility] on amion for assistance.

## 2023-04-22 NOTE — Plan of Care (Signed)
  Problem: Coping: Goal: Ability to adjust to condition or change in health will improve Outcome: Progressing   Problem: Metabolic: Goal: Ability to maintain appropriate glucose levels will improve Outcome: Progressing   Problem: Nutritional: Goal: Maintenance of adequate nutrition will improve Outcome: Progressing Goal: Progress toward achieving an optimal weight will improve Outcome: Progressing   Problem: Skin Integrity: Goal: Risk for impaired skin integrity will decrease Outcome: Progressing   Problem: Activity: Goal: Risk for activity intolerance will decrease Outcome: Progressing   Problem: Nutrition: Goal: Adequate nutrition will be maintained Outcome: Progressing   Problem: Coping: Goal: Level of anxiety will decrease Outcome: Progressing   Problem: Elimination: Goal: Will not experience complications related to bowel motility Outcome: Progressing Goal: Will not experience complications related to urinary retention Outcome: Progressing

## 2023-04-22 NOTE — Plan of Care (Signed)
 Received report, tolerating fluids and diet well, up to BR with walker, able to walk  in hall without oxygen. Tolerated well. Slept well. Headache this AM, given tylenol.

## 2023-04-23 DIAGNOSIS — J9601 Acute respiratory failure with hypoxia: Secondary | ICD-10-CM | POA: Diagnosis not present

## 2023-04-23 LAB — BASIC METABOLIC PANEL
Anion gap: 5 (ref 5–15)
BUN: 47 mg/dL — ABNORMAL HIGH (ref 8–23)
CO2: 35 mmol/L — ABNORMAL HIGH (ref 22–32)
Calcium: 9 mg/dL (ref 8.9–10.3)
Chloride: 95 mmol/L — ABNORMAL LOW (ref 98–111)
Creatinine, Ser: 1.22 mg/dL — ABNORMAL HIGH (ref 0.44–1.00)
GFR, Estimated: 47 mL/min — ABNORMAL LOW (ref >60)
Glucose, Bld: 110 mg/dL — ABNORMAL HIGH (ref 70–99)
Potassium: 4.4 mmol/L (ref 3.5–5.1)
Sodium: 135 mmol/L (ref 135–145)

## 2023-04-23 LAB — CBC
HCT: 39.8 % (ref 36.0–46.0)
Hemoglobin: 12.2 g/dL (ref 12.0–15.0)
MCH: 28.4 pg (ref 26.0–34.0)
MCHC: 30.7 g/dL (ref 30.0–36.0)
MCV: 92.6 fL (ref 80.0–100.0)
Platelets: 215 10*3/uL (ref 150–400)
RBC: 4.3 MIL/uL (ref 3.87–5.11)
RDW: 14 % (ref 11.5–15.5)
WBC: 7.1 10*3/uL (ref 4.0–10.5)
nRBC: 0 % (ref 0.0–0.2)

## 2023-04-23 LAB — GLUCOSE, CAPILLARY
Glucose-Capillary: 128 mg/dL — ABNORMAL HIGH (ref 70–99)
Glucose-Capillary: 120 mg/dL — ABNORMAL HIGH (ref 70–99)
Glucose-Capillary: 107 mg/dL — ABNORMAL HIGH (ref 70–99)

## 2023-04-23 LAB — PHOSPHORUS: Phosphorus: 3.5 mg/dL (ref 2.5–4.6)

## 2023-04-23 LAB — MAGNESIUM: Magnesium: 2.5 mg/dL — ABNORMAL HIGH (ref 1.7–2.4)

## 2023-04-23 MED ORDER — CYANOCOBALAMIN 1000 MCG PO TABS: 1000.0000 ug | ORAL_TABLET | Freq: Every day | ORAL | 0 refills | Status: AC

## 2023-04-23 NOTE — Discharge Summary (Signed)
 [facility] Discharge Summary   Patient: [name] AOZ:[id number]  PCP: Lorre Munroe, NP  Date of admission: 04/17/2023   Date of discharge:  04/23/2023     Discharge Diagnoses:  Principal Problem:   Acute respiratory failure with hypoxia [facility]) Active Problems:   Fall   DM (diabetes mellitus), type 2 (HCC)   Benign essential HTN   Mixed hyperlipidemia   Hypothyroidism   Encephalopathy   Admitted From: Home Disposition:  Home with [facility]  Recommendations for Outpatient Follow-up:  F/u with PCP in 1 wk, monitor BP at home, discontinued Cardizem and Lisinopril due to Orthostatic hypotension. Need to decrease polypharmacy for Anxiety and depression medications. Resumed Celexa, decreased Klonopin and dc'd Doxepin. Follow up LABS/TEST:  BMP in 1 wk   Follow-up Information     Lorre Munroe, NP Follow up in 1 week(s).   Specialties: Internal Medicine, Emergency Medicine Contact information: [address] [place] [address] [phone]                Diet recommendation: Cardiac and Carb modified diet  Activity: The patient is advised to gradually reintroduce usual activities, as tolerated  Discharge Condition: stable  Code Status: Full code   History of present illness: As per the H and P dictated on admission Hospital Course:  [name] is a 73 y.o. female with medical history significant of hypertension, hypothyroidism, COPD, GERD, arthritis, psoriasis on monoclonal antibody injections, anxiety disorder, peripheral vascular disease, status post recent laminectomy on 02/2023 presenting with fall, hip pain, acute respiratory failure with hypoxia, COPD exacerbation, RSV, weakness.  Son reports mother with increased work of breathing over the past 2 to 2 days.  Positive cough wheezing and increased bleeding production.  No longer smoking.  Noted to have been admitted February 2025 for issues including metabolic encephalopathy with concern for UTI as well as  influenza A.  Per the son, patient is had progressive decline over several months.  Does live at home alone.  Son feels patient being at home alone is a chronic safety concern.  Also with worsening confusion. Had noted fall with no reported head trauma or loss of consciousness.  No reported sick contacts.  No reports of chest pain, abdominal pain, nausea or vomiting.  Son reports prior history of recurrent falls prior to this admission. Presented to the ER afebrile, hemodynamically stable.  Satting well on room air.  White count 7.4, hemoglobin 11.1, platelets 276, creatinine 1.25, glucose 114, RSV positive.  Chest x-ray and R hip plain films grossly stable.   Assessment and Plan:   # Acute respiratory failure with hypoxia, resolved # COPD Exacerbation  # RSV viral infection Decompensated respiratory s/p 2 L nasal cannula in the setting of overlapping RSV and COPD exacerbation. Chest x-ray grossly stable. Noted RSV positive on respiratory screen. S/p IV Solu-Medrol, continue prednisone po 40 x 4 days DuoNebs q6 prn. S/p IV azithromycin x 2 doses.  Supplemental O2 inhalation gradually weaned off, currently patient is saturating well on room air.  Denies any respiratory symptoms.  Patient is stable to discharge home.  # Lower extremity edema, Bilateral, Resolved on 3/16 DC'd IV fluid. S/p S/p Lasix 40 mg IV daily. BNP 501 elevated 3/14 edema improved, LLE edema is more than RLE TTE LVEF 60-65%, no WMA, and no any other significant abnormality [name] V. Duplex LLE negative for DVT 3/16 edema resolved    # Hypokalemia due to diuresis. Potassium repleted.  # Orthostatic hypotension  might be causing frequent Fall and generalized Weakness Recurrent falls at home with noted progressive worsening weakness per the son at the bedside. Noted positive trauma to the left hip. Plain films within normal limits. Noted recent laminectomy on 01/23, Progressive worsening functional status at home per report. Grossly  nonfocal neuroexam.  PT and OT evaluation done Rec SNF placement but she can not afford to pay upfront the required amount.   Continue Fall precautions and Anticipate rehab placement Iron profile, folate, and vitamin D levels within normal range # B12 level 228 goal >400, started vitamin B12 injection during hospital stay, followed by oral supplement    # Acute metabolic Encephalopathy, Resolved + generalized confusion on presentation in setting of COPD exacerbation, RSV, decompensated resp failure. Grossly nonfocal neuro exam Son reports confusion being subacute vs. Chronic issue- ? Underlying dementia Unclear of general baseline.   # Depression and anxiety: Resumed Klonopin 0.5 mg p.o. 3 times daily lower dose due to encephalopathy and confusion. Held Celexa and doxepin during hospital stay.  Resumed Celexa on discharge but discontinued doxepin on discharge, due to polypharmacy.  Patient was advised to follow-up with PCP for further management as an outpatient.   # Hypothyroidism: Continue Synthroid # Mixed hyperlipidemia: Resumed Crestor and fenofibrate home dose  # Benign essential HTN:  # Orthostatic hypotension, noticed on 3/17 as per orthostatic vital signs Held Cardizem and lisinopril during hospital stay and discontinued on discharge.  Resumed Coreg 3.125 mg p.o. twice daily with holding parameters 3/17 orthostatics positive.  So patient was advised to monitor BP at home, moves slow while standing up and walking.  Follow with PCP for further management as an outpatient.   # DM (diabetes mellitus), type 2 s/p  08/28/2022 HbA1c 6.9, well-controlled.  Currently patient is not on any home medication.  Hyperglycemia due to steroids, s/p insulin sliding scale during hospital stay.  Patient was advised to continue diabetic diet and follow with PCP.  Body mass index is 32.8 kg/m.  Nutrition Interventions:  - Patient was instructed, not to drive, operate heavy machinery, perform activities  at heights, swimming or participation in water activities or provide baby sitting services while on Pain, Sleep and Anxiety Medications; until her outpatient Physician has advised to do so again.  - Also recommended to not to take more than prescribed Pain, Sleep and Anxiety Medications.  Patient was seen by physical therapy, who recommended Therapy, SNF placement.  TOC was consulted, patient could not afford to pay upfront payment of around $3000 so she decided to go home with home physical therapy, which was arranged by TOC.   On the day of the discharge the patient's vitals were stable, and no other acute medical condition were reported by patient. the patient was felt safe to be discharge at Home with Home health.  Consultants: None Procedures: None  Discharge Exam: General: Appear in no distress, no Rash; Oral Mucosa Clear, moist. Cardiovascular: S1 and S2 Present, no Murmur, Respiratory: normal respiratory effort, Bilateral Air entry present and no Crackles, no wheezes Abdomen: Bowel Sound present, Soft and no tenderness, no hernia Extremities: no Pedal edema, no calf tenderness Neurology: alert and oriented to time, place, and person affect appropriate.  Filed Weights   04/17/23 1116  Weight: 84 kg   Vitals:   04/23/23 0405 04/23/23 0802  BP: (!) 124/47 128/75  Pulse: 72 76  Resp: 16 18  Temp: 98.2 F (36.8 C) 97.9 F (36.6 C)  SpO2: 99% 100%  DISCHARGE MEDICATION: Allergies as of 04/23/2023       Reactions   Ciprofloxacin Hives, Diarrhea   Gabapentin Other (See Comments)   Hallucination   Trazodone And Nefazodone Other (See Comments)   Altered Mental Status   Trazodone Other (See Comments)   Mucinex [guaifenesin Er] Nausea Only   Oxycodone Diarrhea   Sulfa Antibiotics Rash   Throat, mouth Throat, mouth        Medication List     STOP taking these medications    diltiazem 120 MG 24 hr capsule Commonly known as: CARDIZEM CD   doxepin 25 MG  capsule Commonly known as: SINEQUAN   lisinopril 10 MG tablet Commonly known as: ZESTRIL       TAKE these medications    acetaminophen 500 MG tablet Commonly known as: TYLENOL Take 500 mg by mouth every 6 (six) hours as needed for moderate pain.   albuterol 108 (90 Base) MCG/ACT inhaler Commonly known as: VENTOLIN HFA INHALE 2 PUFFS BY MOUTH EVERY 6 HOURS AS NEEDED FOR WHEEZING OR SHORTNESS OF BREATH   Aspirin Low Dose 81 MG tablet Generic drug: aspirin EC TAKE 1 TABLET(81 MG) BY MOUTH DAILY. SWALLOW WHOLE   carvedilol 3.125 MG tablet Commonly known as: COREG TAKE 1 TABLET BY MOUTH TWICE DAILY WITH A MEAL   citalopram 20 MG tablet Commonly known as: CELEXA Take 1 tablet (20 mg total) by mouth daily.   clonazePAM 0.5 MG tablet Commonly known as: KLONOPIN What changed:  medication strength additional instructions   cyanocobalamin 1000 MCG tablet Take 1 tablet (1,000 mcg total) by mouth daily. Start taking on: April 26, 2023   fenofibrate 145 MG tablet Commonly known as: TRICOR Take 1 tablet (145 mg total) by mouth daily.   levothyroxine 100 MCG tablet Commonly known as: SYNTHROID Take 1 tablet (100 mcg total) by mouth daily.   pantoprazole 40 MG tablet Commonly known as: PROTONIX SMARTSIG:1 Tablet(s) By Mouth Every Evening   Quad Cane/Small Base Misc 1 Device by Does not apply route daily.   rosuvastatin 20 MG tablet Commonly known as: Crestor Take 1 tablet (20 mg total) by mouth daily.   senna 8.6 MG Tabs tablet Commonly known as: SENOKOT Take 1 tablet (8.6 mg total) by mouth 2 (two) times daily as needed for mild constipation.   triamcinolone ointment 0.1 % Commonly known as: KENALOG Apply 1 Application topically as needed.       Allergies  Allergen Reactions   Ciprofloxacin Hives and Diarrhea   Gabapentin Other (See Comments)    Hallucination   Trazodone And Nefazodone Other (See Comments)    Altered Mental Status    Trazodone Other (See  Comments)   Mucinex [Guaifenesin Er] Nausea Only   Oxycodone Diarrhea   Sulfa Antibiotics Rash    Throat, mouth Throat, mouth   Discharge Instructions     Call MD for:  difficulty breathing, headache or visual disturbances   Complete by: As directed    Call MD for:  extreme fatigue   Complete by: As directed    Call MD for:  persistant dizziness or light-headedness   Complete by: As directed    Call MD for:  persistant nausea and vomiting   Complete by: As directed    Call MD for:  severe uncontrolled pain   Complete by: As directed    Call MD for:  temperature >100.4   Complete by: As directed    Diet - low sodium heart healthy   Complete by:  As directed    Discharge instructions   Complete by: As directed    F/u with PCP in 1 wk, monitor BP at home, discontinued Cardizem and Lisinopril due to Orthostatic hypotension. Need to decrease polypharmacy for Anxiety and depression medications. Resumed Celexa, decreased Klonopin and dc'd Doxepin.   Increase activity slowly   Complete by: As directed        The results of significant diagnostics from this hospitalization (including imaging, microbiology, ancillary and laboratory) are listed below for reference.    Significant Diagnostic Studies: ECHOCARDIOGRAM COMPLETE Result Date: 04/20/2023    ECHOCARDIOGRAM REPORT   Patient Name:   [name] Date of Exam: 04/19/2023 Medical Rec #:  [id number]   Height:       63.0 in Accession #:    [id number]  Weight:       185.2 lb Date of Birth:  Apr 05, 1950   BSA:          1.871 m Patient Age:    73 years    BP:           125/68 mmHg Patient Gender: F           HR:           84 bpm. Exam Location:  [facility] Procedure: 2D Echo, Cardiac Doppler, Color Doppler and Strain Analysis (Both            Spectral and Color Flow Doppler were utilized during procedure). Indications:     CHF  History:         Patient has prior history of Echocardiogram examinations, most                  recent 12/02/2017. CHF, COPD;  Risk Factors:Hypertension,                  Diabetes and Dyslipidemia.  Sonographer:     Mikki Harbor Referring Phys:  [id number] Gillis Santa Diagnosing Phys: Clotilde Dieter  Sonographer Comments: Image acquisition challenging due to COPD. Global longitudinal strain was attempted. IMPRESSIONS  1. Left ventricular ejection fraction, by estimation, is 60 to 65%. The left ventricle has normal function. The left ventricle has no regional wall motion abnormalities. Left ventricular diastolic parameters were normal.  2. Right ventricular systolic function is normal. The right ventricular size is normal. There is mildly elevated pulmonary artery systolic pressure.  3. The mitral valve is normal in structure. Trivial mitral valve regurgitation. No evidence of mitral stenosis.  4. The aortic valve is normal in structure. Aortic valve regurgitation is not visualized. No aortic stenosis is present.  5. The inferior vena cava is normal in size with greater than 50% respiratory variability, suggesting right atrial pressure of 3 mmHg. FINDINGS  Left Ventricle: Left ventricular ejection fraction, by estimation, is 60 to 65%. The left ventricle has normal function. The left ventricle has no regional wall motion abnormalities. The left ventricular internal cavity size was normal in size. There is  no left ventricular hypertrophy. Left ventricular diastolic parameters were normal. Right Ventricle: The right ventricular size is normal. No increase in right ventricular wall thickness. Right ventricular systolic function is normal. There is mildly elevated pulmonary artery systolic pressure. The tricuspid regurgitant velocity is 2.72  m/s, and with an assumed right atrial pressure of 8 mmHg, the estimated right ventricular systolic pressure is 37.6 mmHg. Left Atrium: Left atrial size was normal in size. Right Atrium: Right atrial size was normal in size. Pericardium: There is no evidence of  pericardial effusion. Mitral Valve: The  mitral valve is normal in structure. Trivial mitral valve regurgitation. No evidence of mitral valve stenosis. MV peak gradient, 5.8 mmHg. The mean mitral valve gradient is 3.0 mmHg. Tricuspid Valve: The tricuspid valve is normal in structure. Tricuspid valve regurgitation is mild. Aortic Valve: The aortic valve is normal in structure. Aortic valve regurgitation is not visualized. No aortic stenosis is present. Aortic valve mean gradient measures 3.0 mmHg. Aortic valve peak gradient measures 6.5 mmHg. Aortic valve area, by VTI measures 2.51 cm. Pulmonic Valve: The pulmonic valve was normal in structure. Pulmonic valve regurgitation is not visualized. Aorta: The aortic root is normal in size and structure. Venous: The inferior vena cava is normal in size with greater than 50% respiratory variability, suggesting right atrial pressure of 3 mmHg. IAS/Shunts: No atrial level shunt detected by color flow Doppler.  LEFT VENTRICLE PLAX 2D LVIDd:         3.60 cm     Diastology LVIDs:         2.20 cm     LV e' medial:    9.00 cm/s LV PW:         0.90 cm     LV E/e' medial:  10.0 LV IVS:        1.40 cm     LV e' lateral:   8.70 cm/s LVOT diam:     1.80 cm     LV E/e' lateral: 10.3 LV SV:         64 LV SV Index:   34 LVOT Area:     2.54 cm  LV Volumes (MOD) LV vol d, MOD A2C: 45.2 ml LV vol d, MOD A4C: 48.2 ml LV vol s, MOD A2C: 19.3 ml LV vol s, MOD A4C: 16.3 ml LV SV MOD A2C:     25.9 ml LV SV MOD A4C:     48.2 ml LV SV MOD BP:      29.9 ml RIGHT VENTRICLE RV Basal diam:  3.15 cm RV Mid diam:    2.40 cm RV S prime:     12.10 cm/s TAPSE (M-mode): 2.0 cm LEFT ATRIUM             Index        RIGHT ATRIUM           Index LA diam:        4.00 cm 2.14 cm/m   RA Area:     14.50 cm LA Vol (A2C):   44.8 ml 23.94 ml/m  RA Volume:   34.00 ml  18.17 ml/m LA Vol (A4C):   44.8 ml 23.94 ml/m LA Biplane Vol: 44.5 ml 23.78 ml/m  AORTIC VALVE                    PULMONIC VALVE AV Area (Vmax):    2.34 cm     PV Vmax:       1.19 m/s AV  Area (Vmean):   2.22 cm     PV Peak grad:  5.7 mmHg AV Area (VTI):     2.51 cm AV Vmax:           127.00 cm/s AV Vmean:          83.300 cm/s AV VTI:            0.257 m AV Peak Grad:      6.5 mmHg AV Mean Grad:      3.0 mmHg LVOT Vmax:  117.00 cm/s LVOT Vmean:        72.800 cm/s LVOT VTI:          0.253 m LVOT/AV VTI ratio: 0.98  AORTA Ao Root diam: 3.40 cm MITRAL VALVE                TRICUSPID VALVE MV Area (PHT): 5.02 cm     TR Peak grad:   29.6 mmHg MV Area VTI:   2.22 cm     TR Vmax:        272.00 cm/s MV Peak grad:  5.8 mmHg MV Mean grad:  3.0 mmHg     SHUNTS MV Vmax:       1.20 m/s     Systemic VTI:  0.25 m MV Vmean:      72.5 cm/s    Systemic Diam: 1.80 cm MV Decel Time: 151 msec MV E velocity: 89.60 cm/s MV A velocity: 110.00 cm/s MV E/A ratio:  0.81 Sabina Custovic Electronically signed by Clotilde Dieter Signature Date/Time: 04/20/2023/11:26:07 AM    Final    US Venous Img Lower Unilateral Left (DVT) Result Date: 04/19/2023 CLINICAL DATA:  Left lower extremity edema. EXAM: LEFT LOWER EXTREMITY VENOUS DOPPLER ULTRASOUND TECHNIQUE: Gray-scale sonography with graded compression, as well as color Doppler and duplex ultrasound were performed to evaluate the lower extremity deep venous systems from the level of the common femoral vein and including the common femoral, femoral, profunda femoral, popliteal and calf veins including the posterior tibial, peroneal and gastrocnemius veins when visible. The superficial great saphenous vein was also interrogated. Spectral Doppler was utilized to evaluate flow at rest and with distal augmentation maneuvers in the common femoral, femoral and popliteal veins. COMPARISON:  Prior study on 03/25/2023 FINDINGS: Contralateral Common Femoral Vein: Respiratory phasicity is normal and symmetric with the symptomatic side. No evidence of thrombus. Normal compressibility. Common Femoral Vein: No evidence of thrombus. Normal compressibility, respiratory phasicity and  response to augmentation. Saphenofemoral Junction: No evidence of thrombus. Normal compressibility and flow on color Doppler imaging. Profunda Femoral Vein: No evidence of thrombus. Normal compressibility and flow on color Doppler imaging. Femoral Vein: No evidence of thrombus. Normal compressibility, respiratory phasicity and response to augmentation. Popliteal Vein: No evidence of thrombus. Normal compressibility, respiratory phasicity and response to augmentation. Calf Veins: No evidence of thrombus. Normal compressibility and flow on color Doppler imaging. Superficial Great Saphenous Vein: No evidence of thrombus. Normal compressibility. Venous Reflux:  None. Other Findings: No evidence of superficial thrombophlebitis or abnormal fluid collection. IMPRESSION: No evidence of left lower extremity deep venous thrombosis. Electronically Signed   By: Irish Lack M.D.   On: 04/19/2023 16:04   DG Chest 2 View Result Date: 04/17/2023 CLINICAL DATA:  Weakness. EXAM: CHEST - 2 VIEW COMPARISON:  03/25/2023. FINDINGS: Bilateral lung fields are clear. Bilateral costophrenic angles are clear. Normal cardio-mediastinal silhouette. No acute osseous abnormalities. The soft tissues are within normal limits. IMPRESSION: No active cardiopulmonary disease. Electronically Signed   By: Jules Schick M.D.   On: 04/17/2023 15:26   DG Hip Unilat W or Wo Pelvis 2-3 Views Right Result Date: 04/17/2023 CLINICAL DATA:  Fall.  Right hip pain. EXAM: DG HIP (WITH OR WITHOUT PELVIS) 2-3V RIGHT COMPARISON:  None Available. FINDINGS: Pelvis is intact with normal and symmetric sacroiliac joints. No acute fracture or dislocation. No aggressive osseous lesion. Visualized sacral arcuate lines are unremarkable. Unremarkable symphysis pubis. There are mild degenerative changes of bilateral hip joints without significant joint space narrowing. Osteophytosis of  the superior acetabulum. No radiopaque foreign bodies. IMPRESSION: No acute osseous  abnormality of the pelvis or right hip joint. Electronically Signed   By: Jules Schick M.D.   On: 04/17/2023 13:32   DG Chest 1 View Result Date: 03/25/2023 CLINICAL DATA:  Shortness of breath EXAM: CHEST  1 VIEW COMPARISON:  01/10/2023, 01/02/2023 FINDINGS: Low lung volumes. Streaky bibasilar atelectasis. Stable cardiomediastinal silhouette with aortic atherosclerosis. No pneumothorax IMPRESSION: Low lung volumes with streaky bibasilar atelectasis. Electronically Signed   By: Jasmine Pang M.D.   On: 03/25/2023 21:54   CT ABDOMEN PELVIS W CONTRAST Result Date: 03/25/2023 CLINICAL DATA:  Left lower quadrant abdominal pain EXAM: CT ABDOMEN AND PELVIS WITH CONTRAST TECHNIQUE: Multidetector CT imaging of the abdomen and pelvis was performed using the standard protocol following bolus administration of intravenous contrast. RADIATION DOSE REDUCTION: This exam was performed according to the departmental dose-optimization program which includes automated exposure control, adjustment of the mA and/or kV according to patient size and/or use of iterative reconstruction technique. CONTRAST:  OMNIPAQUE IOHEXOL 300 MG/ML  SOLN COMPARISON:  01/10/2023 FINDINGS: Lower chest: No acute abnormality. Previously noted left pleural effusion has resolved. Extensive coronary artery calcification. Hepatobiliary: Mild hepatic steatosis. No enhancing intrahepatic mass. No intra or extrahepatic biliary ductal dilation. Gallbladder unremarkable. Pancreas: Unremarkable Spleen: Unremarkable Adrenals/Urinary Tract: 2 cm left adrenal nodule is stable since prior examination and measures 52 Hounsfield units on portal venous phase images and 29 Hounsfield units 4 minute delayed images and is technically indeterminate. This most likely represents a benign adrenal adenoma and could be confirmed with dedicated adrenal mass protocol CT or MR imaging in year. The right adrenal gland is unremarkable. The kidneys are normal in size and  position. 2 mm cortical calcification within the lower pole the right kidney is nonspecific, likely the result remote trauma or inflammation. The kidneys are otherwise unremarkable. The bladder is unremarkable. Stomach/Bowel: Mild sigmoid diverticulosis. Stomach, small bowel, and large bowel are otherwise unremarkable. Appendix normal. No evidence of obstruction or focal inflammation. No free intraperitoneal gas or fluid. Vascular/Lymphatic: Aortic atherosclerosis. No enlarged abdominal or pelvic lymph nodes. Reproductive: Status post hysterectomy. Stable 5.1 cm minimally complex cyst within the left ovary demonstrating a single thin noncalcified internal septation on MRI examination of 12/19/2022. No follow-up imaging is recommended for this lesion. Other: Moderate fat containing umbilical hernia. Musculoskeletal: Bilateral laminectomy and posterior decompression of L4 and L5 has been performed. Degenerative changes are seen throughout the visualized thoracolumbar spine. No acute bone abnormality. No lytic or blastic bone lesion. IMPRESSION: 1. No acute intra-abdominal pathology identified. No definite radiographic explanation for the patient's reported symptoms. 2. Extensive coronary artery calcification. 3. Mild hepatic steatosis. 4. Mild sigmoid diverticulosis without superimposed acute inflammatory change. 5. Stable 2 cm left adrenal nodule, technically indeterminate, but most likely representing a benign adrenal adenoma. This could be confirmed with dedicated adrenal mass protocol CT or MR imaging in year. 6. Moderate fat containing umbilical hernia. Aortic Atherosclerosis (ICD10-I70.0). Electronically Signed   By: Helyn Numbers M.D.   On: 03/25/2023 19:43   CT Head Wo Contrast Result Date: 03/25/2023 CLINICAL DATA:  Mental status change, unknown cause. EXAM: CT HEAD WITHOUT CONTRAST TECHNIQUE: Contiguous axial images were obtained from the base of the skull through the vertex without intravenous contrast.  RADIATION DOSE REDUCTION: This exam was performed according to the departmental dose-optimization program which includes automated exposure control, adjustment of the mA and/or kV according to patient size and/or use of iterative reconstruction technique.  COMPARISON:  Head CT 12/01/2017 FINDINGS: Brain: There is no evidence of an acute infarct, intracranial hemorrhage, mass, midline shift, or extra-axial fluid collection. Mild cerebral atrophy is within normal limits for age. Vascular: Calcified atherosclerosis at the skull base. No hyperdense vessel. Skull: No acute fracture or suspicious osseous lesion. Sinuses/Orbits: Chronic right maxillary sinusitis with complete sinus opacification. Clear mastoid air cells. Bilateral cataract extraction. Other: None. IMPRESSION: No evidence of acute intracranial abnormality. Electronically Signed   By: Sebastian Ache M.D.   On: 03/25/2023 14:44   US Venous Img Lower Unilateral Left Result Date: 03/25/2023 CLINICAL DATA:  LLE swelling, pain; recent surgery EXAM: LEFT LOWER EXTREMITY VENOUS DOPPLER ULTRASOUND TECHNIQUE: Gray-scale sonography with compression, as well as color and duplex ultrasound, were performed to evaluate the deep venous system(s) from the level of the common femoral vein through the popliteal and proximal calf veins. COMPARISON:  CT AP, 01/10/2023. FINDINGS: VENOUS Normal compressibility of the common femoral, superficial femoral, and popliteal veins, as well as the visualized calf veins. Visualized portions of profunda femoral vein and great saphenous vein unremarkable. No filling defects to suggest DVT on grayscale or color Doppler imaging. Doppler waveforms show normal direction of venous flow, normal respiratory plasticity and response to augmentation. Limited views of the contralateral common femoral vein are unremarkable. OTHER No evidence of superficial thrombophlebitis or abnormal fluid collection. Limitations: none IMPRESSION: No evidence of  femoropopliteal DVT or superficial thrombophlebitis within the LEFT lower extremity. Roanna Banning, MD [facility] Radiology Electronically Signed   By: Roanna Banning M.D.   On: 03/25/2023 12:37    Microbiology: Recent Results (from the past 240 hours)  Resp panel by RT-PCR (RSV, Flu A&B, Covid) Anterior Nasal Swab     Status: Abnormal   Collection Time: 04/17/23  1:06 PM   Specimen: Anterior Nasal Swab  Result Value Ref Range Status   SARS Coronavirus 2 by RT PCR NEGATIVE NEGATIVE Final    Comment: (NOTE) SARS-CoV-2 target nucleic acids are NOT DETECTED.  The SARS-CoV-2 RNA is generally detectable in upper respiratory specimens during the acute phase of infection. The lowest concentration of SARS-CoV-2 viral copies this assay can detect is 138 copies/mL. A negative result does not preclude SARS-Cov-2 infection and should not be used as the sole basis for treatment or other patient management decisions. A negative result may occur with  improper specimen collection/handling, submission of specimen other than nasopharyngeal swab, presence of viral mutation(s) within the areas targeted by this assay, and inadequate number of viral copies(<138 copies/mL). A negative result must be combined with clinical observations, patient history, and epidemiological information. The expected result is Negative.  Fact Sheet for Patients:  BloggerCourse.com  Fact Sheet for Healthcare Providers:  SeriousBroker.it  This test is no t yet approved or cleared by the [place] FDA and  has been authorized for detection and/or diagnosis of SARS-CoV-2 by FDA under an Emergency Use Authorization (EUA). This EUA will remain  in effect (meaning this test can be used) for the duration of the COVID-19 declaration under Section 564(b)(1) of the Act, 21 U.S.C.section 360bbb-3(b)(1), unless the authorization is  terminated  or revoked sooner.       Influenza A by PCR NEGATIVE NEGATIVE Final   Influenza B by PCR NEGATIVE NEGATIVE Final    Comment: (NOTE) The Xpert Xpress SARS-CoV-2/FLU/RSV plus assay is intended as an aid in the diagnosis of influenza from Nasopharyngeal swab specimens and should not be used as a sole basis  for treatment. Nasal washings and aspirates are unacceptable for Xpert Xpress SARS-CoV-2/FLU/RSV testing.  Fact Sheet for Patients: BloggerCourse.com  Fact Sheet for Healthcare Providers: SeriousBroker.it  This test is not yet approved or cleared by the [place] FDA and has been authorized for detection and/or diagnosis of SARS-CoV-2 by FDA under an Emergency Use Authorization (EUA). This EUA will remain in effect (meaning this test can be used) for the duration of the COVID-19 declaration under Section 564(b)(1) of the Act, 21 U.S.C. section 360bbb-3(b)(1), unless the authorization is terminated or revoked.     Resp Syncytial Virus by PCR POSITIVE (A) NEGATIVE Final    Comment: (NOTE) Fact Sheet for Patients: BloggerCourse.com  Fact Sheet for Healthcare Providers: SeriousBroker.it  This test is not yet approved or cleared by the [place] FDA and has been authorized for detection and/or diagnosis of SARS-CoV-2 by FDA under an Emergency Use Authorization (EUA). This EUA will remain in effect (meaning this test can be used) for the duration of the COVID-19 declaration under Section 564(b)(1) of the Act, 21 U.S.C. section 360bbb-3(b)(1), unless the authorization is terminated or revoked.  Performed at [facility], [address] [place], [place] [address]      Labs: CBC: Recent Labs  Lab 04/17/23 1306 04/18/23 0416 04/19/23 0436 04/20/23 0453 04/21/23 0536 04/22/23 0614 04/23/23 0604  WBC 7.4   < > 11.2* 8.9 7.7 8.9 7.1   NEUTROABS 3.6  --   --   --   --   --   --   HGB 11.1*   < > 10.6* 10.4* 11.6* 12.1 12.2  HCT 36.5   < > 34.7* 33.2* 37.6 38.9 39.8  MCV 95.3   < > 94.0 91.5 94.0 92.0 92.6  PLT 276   < > 247 233 214 235 215   < > = values in this interval not displayed.   Basic Metabolic Panel: Recent Labs  Lab 04/19/23 0436 04/20/23 0453 04/21/23 0536 04/22/23 0614 04/23/23 0604  NA 139 137 139 140 135  K 3.8 3.3* 3.6 4.6 4.4  CL 101 99 99 96* 95*  CO2 31 32 34* 36* 35*  GLUCOSE 165* 137* 132* 121* 110*  BUN 35* 43* 44* 46* 47*  CREATININE 1.18* 1.16* 1.08* 1.19* 1.22*  CALCIUM 8.9 8.7* 8.7* 9.2 9.0  MG 2.0 2.3 2.5* 2.4 2.5*  PHOS 2.9 2.8 3.0 3.2 3.5   Liver Function Tests: Recent Labs  Lab 04/17/23 1306 04/18/23 0416  AST 23 19  ALT 19 15  ALKPHOS 65 56  BILITOT 0.9 0.7  PROT 6.7 5.9*  ALBUMIN 3.5 2.8*   No results for input(s): "LIPASE", "AMYLASE" in the last 168 hours. No results for input(s): "AMMONIA" in the last 168 hours. Cardiac Enzymes: No results for input(s): "CKTOTAL", "CKMB", "CKMBINDEX", "TROPONINI" in the last 168 hours. BNP (last 3 results) Recent Labs    04/18/23 1514  BNP 501.3*   CBG: Recent Labs  Lab 04/22/23 1211 04/22/23 1644 04/22/23 2041 04/23/23 0832 04/23/23 1146  GLUCAP 110* 164* 126* 128* 120*    Time spent: 35 minutes  Signed:  Gillis Santa  [facility] 04/23/2023 12:02 PM

## 2023-04-23 NOTE — TOC Transition Note (Signed)
 Transition of Care [facility]) - Discharge Note   Patient Details  Name: [name] MRN: [id number] Date of Birth: 02-06-1950  [facility] of [facility]) CM/SW Contact:  Chapman Fitch, RN Phone Number: 04/23/2023, 3:10 PM   Clinical Narrative:    Patient to discharge today Per MD son has decided they would not be able to pay copays and patient will return home with home health services.  VM left for son to confirm Per patient son works 3rd shift and doesn't get up till 3pm so he is unavailable by phone   Patient active with [facility]. Shaun with [facility] notified of discharge     Barriers to Discharge: Continued Medical Work up   Patient Goals and CMS Choice Patient states their goals for this hospitalization and ongoing recovery are:: SNF for STR CMS Medicare.gov Compare Post Acute Care list provided to:: Patient Represenative (must comment) Choice offered to / list presented to : Adult Children      Discharge Placement                       Discharge Plan and Services Additional resources added to the After Visit Summary for                                       Social Drivers of Health (SDOH) Interventions SDOH Screenings   Food Insecurity: No Food Insecurity (04/18/2023)  Housing: Low Risk  (04/18/2023)  Transportation Needs: No Transportation Needs (04/18/2023)  Utilities: Not At Risk (04/18/2023)  Alcohol Screen: Low Risk  (08/28/2022)  Depression (PHQ2-9): Low Risk  (02/08/2023)  Financial Resource Strain: Low Risk  (04/27/2022)  Physical Activity: Inactive (04/27/2022)  Social Connections: Moderately Integrated (04/18/2023)  Recent Concern: Social Connections - Moderately Isolated (03/26/2023)  Stress: No Stress Concern Present (04/27/2022)  Tobacco Use: Medium Risk (04/17/2023)     Readmission Risk Interventions    01/05/2023   12:33 PM  Readmission Risk Prevention Plan  Transportation Screening Complete  PCP or  Specialist Appt within 3-5 Days Complete  HRI or Home Care Consult Complete  Social Work Consult for Recovery Care Planning/Counseling Complete  Palliative Care Screening Not Applicable  Medication Review Oceanographer) Complete

## 2023-04-23 NOTE — Plan of Care (Signed)
  Problem: Education: Goal: Ability to describe self-care measures that may prevent or decrease complications (Diabetes Survival Skills Education) will improve Outcome: Completed/Met Goal: Individualized Educational Video(s) Outcome: Completed/Met   Problem: Coping: Goal: Ability to adjust to condition or change in health will improve Outcome: Completed/Met   Problem: Fluid Volume: Goal: Ability to maintain a balanced intake and output will improve Outcome: Completed/Met   Problem: Health Behavior/Discharge Planning: Goal: Ability to identify and utilize available resources and services will improve Outcome: Completed/Met Goal: Ability to manage health-related needs will improve Outcome: Completed/Met   Problem: Metabolic: Goal: Ability to maintain appropriate glucose levels will improve Outcome: Completed/Met   Problem: Nutritional: Goal: Maintenance of adequate nutrition will improve Outcome: Completed/Met Goal: Progress toward achieving an optimal weight will improve Outcome: Completed/Met   Problem: Skin Integrity: Goal: Risk for impaired skin integrity will decrease Outcome: Completed/Met   Problem: Tissue Perfusion: Goal: Adequacy of tissue perfusion will improve Outcome: Completed/Met   Problem: Education: Goal: Knowledge of General Education information will improve Description: Including pain rating scale, medication(s)/side effects and non-pharmacologic comfort measures Outcome: Completed/Met   Problem: Health Behavior/Discharge Planning: Goal: Ability to manage health-related needs will improve Outcome: Completed/Met   Problem: Clinical Measurements: Goal: Ability to maintain clinical measurements within normal limits will improve Outcome: Completed/Met Goal: Will remain free from infection Outcome: Completed/Met Goal: Diagnostic test results will improve Outcome: Completed/Met Goal: Respiratory complications will improve Outcome: Completed/Met Goal:  Cardiovascular complication will be avoided Outcome: Completed/Met   Problem: Activity: Goal: Risk for activity intolerance will decrease Outcome: Completed/Met   Problem: Nutrition: Goal: Adequate nutrition will be maintained Outcome: Completed/Met   Problem: Coping: Goal: Level of anxiety will decrease Outcome: Completed/Met   Problem: Elimination: Goal: Will not experience complications related to bowel motility Outcome: Completed/Met Goal: Will not experience complications related to urinary retention Outcome: Completed/Met   Problem: Pain Managment: Goal: General experience of comfort will improve and/or be controlled Outcome: Completed/Met   Problem: Safety: Goal: Ability to remain free from injury will improve Outcome: Completed/Met   Problem: Skin Integrity: Goal: Risk for impaired skin integrity will decrease Outcome: Completed/Met   Problem: Education: Goal: Knowledge of disease or condition will improve Outcome: Completed/Met Goal: Knowledge of the prescribed therapeutic regimen will improve Outcome: Completed/Met Goal: Individualized Educational Video(s) Outcome: Completed/Met   Problem: Activity: Goal: Ability to tolerate increased activity will improve Outcome: Completed/Met Goal: Will verbalize the importance of balancing activity with adequate rest periods Outcome: Completed/Met   Problem: Respiratory: Goal: Ability to maintain a clear airway will improve Outcome: Completed/Met Goal: Levels of oxygenation will improve Outcome: Completed/Met Goal: Ability to maintain adequate ventilation will improve Outcome: Completed/Met

## 2023-04-23 NOTE — Progress Notes (Signed)
 Physical Therapy Treatment Patient Details Name: ANNLEIGH KNUEPPEL MRN: [id number] DOB: [birth year] Today's Date: 04/23/2023   History of Present Illness 73 y.o. female with medical history significant of hypertension, hypothyroidism, COPD, GERD, arthritis, psoriasis on monoclonal antibody injections, anxiety disorder, peripheral vascular disease, status post recent laminectomy on 02/2023 presenting with fall, hip pain, acute respiratory failure with hypoxia, COPD exacerbation, RSV, weakness.  Son reports mother with increased work of breathing over the past 2 to 2 days.  Positive cough wheezing and increased bleeding production.  No longer smoking.  Noted to have been admitted February 2025 for issues including metabolic encephalopathy with concern for UTI as well as influenza A.  Per the son, patient is had progressive decline over several months.  Does live at home alone.  Son feels patient being at home alone is a chronic safety concern.  Also with worsening confusion. Had noted fall with no reported head trauma or loss of consciousness.  No reported sick contacts.  No reports of chest pain, abdominal pain, nausea or vomiting.    PT Comments  Patient is agreeable to PT session. She had increased activity tolerance this session. She completed 2 laps in the hallway today using the 4 wheeled walker. Education provided on energy conservation strategies and how to safely use the rollator for seated rest breaks as needed. Recommend to continue PT to maximize independence and facilitate return to prior level of function. The patient reports she will have to go home at this point due to the cost of skilled rehab. Recommend HHPT and intermittent family assistance as needed if returning home.    If plan is discharge home, recommend the following: Assistance with cooking/housework;Direct supervision/assist for medications management;Direct supervision/assist for financial management;Assist for transportation;Help with  stairs or ramp for entrance;Supervision due to cognitive status;A lot of help with bathing/dressing/bathroom;A little help with walking and/or transfers   Can travel by private vehicle     Yes  Equipment Recommendations  None recommended by PT    Recommendations for Other Services       Precautions / Restrictions Precautions Precautions: Fall Recall of Precautions/Restrictions: Intact Precaution/Restrictions Comments: status post recent laminectomy on 01/23 Restrictions Weight Bearing Restrictions Per Provider Order: No     Mobility  Bed Mobility               General bed mobility comments: not assessed, patient sitting up on arrival and post session    Transfers Overall transfer level: Needs assistance Equipment used: Rollator (4 wheels) Transfers: Sit to/from Stand Sit to Stand: Contact guard assist           General transfer comment: CGA for safety    Ambulation/Gait Ambulation/Gait assistance: Contact guard assist, Supervision Gait Distance (Feet): 340 Feet Assistive device: Rollator (4 wheels) Gait Pattern/deviations: Step-through pattern Gait velocity: decreased     General Gait Details: patient walked 2 laps around nursing station with cues for technique and safety using 4 wheeled walker. no standing rest breaks required today. educated patient on how to use the rollator safely if she needs a seated rest break in the future.   Stairs             Wheelchair Mobility     Tilt Bed    Modified Rankin (Stroke Patients Only)       Balance Overall balance assessment: Needs assistance Sitting-balance support: No upper extremity supported, Feet supported Sitting balance-Leahy Scale: Good     Standing balance support: Bilateral upper extremity supported Standing balance-Leahy  Scale: Poor Standing balance comment: relying on rollator for UE support in standing                            Communication  Communication Communication: No apparent difficulties  Cognition Arousal: Alert Behavior During Therapy: WFL for tasks assessed/performed   PT - Cognitive impairments: No apparent impairments                         Following commands: Intact      Cueing Cueing Techniques: Verbal cues  Exercises      General Comments General comments (skin integrity, edema, etc.): education provided on energy conservation strategies to use in home setting      Pertinent Vitals/Pain Pain Assessment Pain Assessment: No/denies pain    Home Living                          Prior Function            PT Goals (current goals can now be found in the care plan section) Acute Rehab PT Goals Patient Stated Goal: to get stronger PT Goal Formulation: With patient Time For Goal Achievement: 05/02/23 Potential to Achieve Goals: Good Progress towards PT goals: Progressing toward goals    Frequency    Min 2X/week      PT Plan      Co-evaluation              AM-PAC PT "6 Clicks" Mobility   Outcome Measure  Help needed turning from your back to your side while in a flat bed without using bedrails?: A Little Help needed moving from lying on your back to sitting on the side of a flat bed without using bedrails?: A Little Help needed moving to and from a bed to a chair (including a wheelchair)?: A Little Help needed standing up from a chair using your arms (e.g., wheelchair or bedside chair)?: A Little Help needed to walk in hospital room?: A Little Help needed climbing 3-5 steps with a railing? : A Lot 6 Click Score: 17    End of Session Equipment Utilized During Treatment: Gait belt Activity Tolerance: Patient tolerated treatment well Patient left: with call bell/phone within reach;in bed (seated on edge of bed) Nurse Communication: Mobility status PT Visit Diagnosis: Repeated falls (R29.6);Muscle weakness (generalized) (M62.81);Difficulty in walking, not  elsewhere classified (R26.2)     Time: 1000-1015 PT Time Calculation (min) (ACUTE ONLY): 15 min  Charges:    $Therapeutic Activity: 8-22 mins PT General Charges $$ ACUTE PT VISIT: 1 Visit                     Donna Bernard, PT, MPT    [facility] 04/23/2023, 10:49 AM

## 2023-04-23 NOTE — Progress Notes (Signed)
 Occupational Therapy Treatment Patient Details Name: [name] MRN: [id number] DOB: 02/19/1950 Today's Date: 04/23/2023   History of present illness 73 y.o. female with medical history significant of hypertension, hypothyroidism, COPD, GERD, arthritis, psoriasis on monoclonal antibody injections, anxiety disorder, peripheral vascular disease, status post recent laminectomy on 02/2023 presenting with fall, hip pain, acute respiratory failure with hypoxia, COPD exacerbation, RSV, weakness.  Son reports mother with increased work of breathing over the past 2 to 2 days.  Positive cough wheezing and increased bleeding production.  No longer smoking.  Noted to have been admitted February 2025 for issues including metabolic encephalopathy with concern for UTI as well as influenza A.  Per the son, patient is had progressive decline over several months.  Does live at home alone.  Son feels patient being at home alone is a chronic safety concern.  Also with worsening confusion. Had noted fall with no reported head trauma or loss of consciousness.  No reported sick contacts.  No reports of chest pain, abdominal pain, nausea or vomiting.   OT comments  Pt seen for OT treatment on this date. Upon arrival to room pt seated in recliner, agreeable to tx. Pt reports her mind is "feeling much sharper." Pt completed LB dressing donning underwear sit/stand, demonstrating good progress in LB dressing task with good safety awareness. Pt further educated on safe ADL completion with DME use, AD dressing techniques, fall prevention out/inside home. Pt making good progress toward goals, will continue to follow POC. Discharge recommendation remains appropriate.        If plan is discharge home, recommend the following:  A little help with walking and/or transfers;Assist for transportation;Assistance with cooking/housework;A little help with bathing/dressing/bathroom   Equipment Recommendations  Other (comment)     Recommendations for Other Services      Precautions / Restrictions Precautions Precautions: Fall Precaution Booklet Issued: No Recall of Precautions/Restrictions: Intact Precaution/Restrictions Comments: status post recent laminectomy on 01/23 Restrictions Weight Bearing Restrictions Per Provider Order: No       Mobility Bed Mobility               General bed mobility comments: NT pt in recliner pre/post session    Transfers Overall transfer level: Needs assistance                 General transfer comment: Not assessed; Pt reported feeling too tired from previous PT session. Will continue to asses pt transfer skills in next tx.     Balance Overall balance assessment: Needs assistance Sitting-balance support: No upper extremity supported, Feet supported Sitting balance-Leahy Scale: Good Sitting balance - Comments: Able to don briefs while seated in recliner   Standing balance support: Single extremity supported, During functional activity                               ADL either performed or assessed with clinical judgement   ADL Overall ADL's : Needs assistance/impaired Eating/Feeding: Independent   Grooming: Brushing hair;Sitting               Lower Body Dressing: Modified independent;Sit to/from stand               Functional mobility during ADLs:  (Reported she already went on a walk today and is feeling sleepy.) General ADL Comments: Pt completed LB dressing donning underwear sit/stand.     Communication Communication Communication: No apparent difficulties   Cognition Arousal: Alert Behavior  During Therapy: WFL for tasks assessed/performed Cognition: No family/caregiver present to determine baseline             OT - Cognition Comments: Pt reports cognition imporving, feels much "sharper"                 Following commands: Intact        Cueing   Cueing Techniques: Verbal cues  Exercises Exercises:  Other exercises Other Exercises Other Exercises: Edu: Safe ADL completion with DME use, AD dressing techniques, fall prevention out/inside home.    Shoulder Instructions       General Comments Pt looking forward to return home.    Pertinent Vitals/ Pain       Pain Assessment Pain Assessment: No/denies pain  Home Living                                          Prior Functioning/Environment              Frequency  Min 2X/week        Progress Toward Goals  OT Goals(current goals can now be found in the care plan section)  Progress towards OT goals: Progressing toward goals  Acute Rehab OT Goals Patient Stated Goal: go home OT Goal Formulation: With patient/family Time For Goal Achievement: 05/02/23 Potential to Achieve Goals: Fair ADL Goals Pt Will Perform Grooming: with supervision;standing Pt Will Perform Lower Body Dressing: with supervision;sit to/from stand Pt Will Transfer to Toilet: with supervision;ambulating Pt Will Perform Toileting - Clothing Manipulation and hygiene: with supervision;sit to/from stand  Plan      Co-evaluation                 AM-PAC OT "6 Clicks" Daily Activity     Outcome Measure   Help from another person eating meals?: None Help from another person taking care of personal grooming?: None Help from another person toileting, which includes using toliet, bedpan, or urinal?: A Little Help from another person bathing (including washing, rinsing, drying)?: A Little Help from another person to put on and taking off regular upper body clothing?: A Little Help from another person to put on and taking off regular lower body clothing?: A Little 6 Click Score: 20    End of Session    OT Visit Diagnosis: Unsteadiness on feet (R26.81);History of falling (Z91.81);Other abnormalities of gait and mobility (R26.89);Repeated falls (R29.6) Pain - Right/Left: Left   Activity Tolerance Patient tolerated treatment  well   Patient Left in chair;with chair alarm set;with call bell/phone within reach   Nurse Communication Mobility status        Time: 3086-5784 OT Time Calculation (min): 22 min  Charges: OT General Charges $OT Visit: 1 Visit OT Treatments $Self Care/Home Management : 8-22 mins [name] M.S. OTR/L  04/23/23, 1:50 PM

## 2023-04-24 ENCOUNTER — Telehealth: Payer: Self-pay

## 2023-04-24 ENCOUNTER — Other Ambulatory Visit: Payer: Self-pay | Admitting: Internal Medicine

## 2023-04-24 DIAGNOSIS — F410 Panic disorder [episodic paroxysmal anxiety] without agoraphobia: Secondary | ICD-10-CM

## 2023-04-24 NOTE — Telephone Encounter (Signed)
 Copied from CRM [id number]. Topic: Clinical - Medication Question >> Apr 24, 2023  2:17 PM Fredrich Romans wrote: Reason for CRM: Patient called in to ask about medication clonazePAM (KLONOPIN) 0.5 MG tablet being sent into pharmacy for her.I informed her that provider has received the request today,and she may be in the process of getting it sent to pharmacy for her.

## 2023-04-24 NOTE — Transitions of Care (Post Inpatient/ED Visit) (Signed)
 04/24/2023  Name: [name] MRN: [id number] DOB: 1950-12-29  Today's TOC FU Call Status: Today's TOC FU Call Status:: Successful TOC FU Call Completed TOC FU Call Complete Date: 04/24/23 Patient's Name and Date of Birth confirmed.  Transition Care Management Follow-up Telephone Call Date of Discharge: 04/23/23 Discharge Facility: [facility]) Type of Discharge: Inpatient Admission Primary Inpatient Discharge Diagnosis:: Chronic Respiratory How have you been since you were released from the hospital?: Better Any questions or concerns?: No  Items Reviewed: Did you receive and understand the discharge instructions provided?: Yes (Reviewed with the patient) Medications obtained,verified, and reconciled?: Yes (Medications Reviewed) Any new allergies since your discharge?: No Dietary orders reviewed?: Yes Type of Diet Ordered:: Low sodium Heart Healthy Do you have support at home?: No  Medications Reviewed Today: Medications Reviewed Today     Reviewed by Redge Gainer, RN (Case Manager) on 04/24/23 at 1454  Med List Status: <None>   Medication Order Taking? Sig Documenting Provider Last Dose Status Informant  acetaminophen (TYLENOL) 500 MG tablet [id number] No Take 500 mg by mouth every 6 (six) hours as needed for moderate pain.  [provider] Taking Active Pharmacy Records, Family Member           Med Note Maisie Fus, Jolly Mango   Wed Apr 17, 2023  4:20 PM) prn  albuterol (VENTOLIN HFA) 108 (90 Base) MCG/ACT inhaler [id number] No INHALE 2 PUFFS BY MOUTH EVERY 6 HOURS AS NEEDED FOR WHEEZING OR SHORTNESS OF Barbaraann Barthel, DO Taking Active Pharmacy Records, Family Member           Med Note Clarene Reamer, MARIA   Tue Mar 26, 2023 10:56 AM) prn  aspirin EC (ASPIRIN LOW DOSE) 81 MG tablet [id number] No TAKE 1 TABLET(81 MG) BY MOUTH DAILY. SWALLOW WHOLE Lorre Munroe, NP 04/16/2023 Morning Active Pharmacy Records, Family Member  carvedilol  (COREG) 3.125 MG tablet [id number] No TAKE 1 TABLET BY MOUTH TWICE DAILY WITH A MEAL Lorre Munroe, NP 04/16/2023 Evening Active Family Member, Pharmacy Records  citalopram (CELEXA) 20 MG tablet [id number] No Take 1 tablet (20 mg total) by mouth daily. Tresa Moore, MD 04/16/2023 Morning Active Family Member, Pharmacy Records  clonazePAM [facility]) 0.5 MG tablet [id number]   Gillis Santa, MD  Active   cyanocobalamin 1000 MCG tablet [id number]  Take 1 tablet (1,000 mcg total) by mouth daily. Gillis Santa, MD  Active   fenofibrate (TRICOR) 145 MG tablet [id number] No Take 1 tablet (145 mg total) by mouth daily. Lorre Munroe, NP 04/16/2023 Morning Active Family Member, Pharmacy Records  levothyroxine (SYNTHROID) 100 MCG tablet [id number] No Take 1 tablet (100 mcg total) by mouth daily. Lorre Munroe, NP 04/17/2023 Morning Active Pharmacy Records, Family Member  Misc. Devices (QUAD CANE/SMALL BASE) MISC [id number] No 1 Device by Does not apply route daily. Galen Manila, NP Taking Active Pharmacy Records, Family Member  pantoprazole (PROTONIX) 40 MG tablet [id number] No SMARTSIG:1 Tablet(s) By Mouth Every Evening [provider] Taking Active Pharmacy Records, Family Member           Med Note Sharia Reeve   Wed Apr 17, 2023  4:21 PM) Patient only takes as needed  rosuvastatin (CRESTOR) 20 MG tablet [id number] No Take 1 tablet (20 mg total) by mouth daily. Lorre Munroe, NP 04/16/2023 Evening Active Pharmacy Records, Family Member  senna (SENOKOT) 8.6 MG TABS tablet [id number] No Take 1 tablet (8.6 mg total) by mouth 2 (two) times  daily as needed for mild constipation. Susanne Borders, PA Taking Active Family Member, Pharmacy Records           Med Note Maisie Fus, Jolly Mango   Wed Apr 17, 2023  4:20 PM) prn  triamcinolone ointment (KENALOG) 0.1 % [id number]  Apply 1 Application topically as needed. Pharmacy, MD  Active Family Member, Pharmacy Records           Med Note  [place], MARIA   Tue Mar 26, 2023 11:05 AM) prn            Home Care and Equipment/Supplies: Were Home Health Services Ordered?: Yes Name of Home Health Agency:: [facility] Has Agency set up a time to come to your home?: No EMR reviewed for Home Health Orders: Orders present/patient has not received call (refer to CM for follow-up) (Contacted the Agency) Any new equipment or medical supplies ordered?: NA  Functional Questionnaire: Do you need assistance with bathing/showering or dressing?: No Do you need assistance with meal preparation?: No Do you need assistance with eating?: No Do you have difficulty maintaining continence: No Do you need assistance with getting out of bed/getting out of a chair/moving?: No Do you have difficulty managing or taking your medications?: No  Follow up appointments reviewed: PCP Follow-up appointment confirmed?: Yes Date of PCP follow-up appointment?: 04/29/23 Follow-up Provider: [facility] Follow-up appointment confirmed?: Yes Date of Specialist follow-up appointment?: 05/20/23 Follow-Up Specialty Provider:: Joan Flores Do you need transportation to your follow-up appointment?: No Do you understand care options if your condition(s) worsen?: Yes-patient verbalized understanding  SDOH Interventions Today    Flowsheet Row Most Recent Value  SDOH Interventions   Food Insecurity Interventions Intervention Not Indicated  Housing Interventions Intervention Not Indicated  Transportation Interventions Intervention Not Indicated  Utilities Interventions Intervention Not Indicated         Goals Addressed             This Visit's Progress    TOC 30 Outreach Program       Current Barriers:  Knowledge Deficits related to plan of care for management of COPD and Anxiety  Chronic Disease Management support and education needs related to COPD and Anxiety   RNCM Clinical Goal(s):  Patient will work with the Care  Management team over the next 30 days to address Transition of Care Barriers: Medication access Medication Management Diet/Nutrition/Food Resources Support at home Provider appointments Home Health services verbalize understanding of plan for management of COPD and Anxiety as evidenced by EMR, no hospital re-admissions in the next 30 days  through collaboration with RN Care manager, provider, and care team.   Interventions: Evaluation of current treatment plan related to  self management and patient's adherence to plan as established by provider   COPD Interventions:  (Status:  New goal.) Short Term Goal Provided patient with basic written and verbal COPD education on self care/management/and exacerbation prevention Advised patient to track and manage COPD triggers Provided instruction about proper use of medications used for management of COPD including inhalers Advised patient to engage in light exercise as tolerated 3-5 days a week to aid in the the management of COPD Provided education about and advised patient to utilize infection prevention strategies to reduce risk of respiratory infection Discussed the importance of adequate rest and management of fatigue with COPD  Patient Goals/Self-Care Activities: Participate in Transition of Care Program/Attend [facility] scheduled calls Notify RN Care Manager of TOC call rescheduling needs Take all medications as prescribed Attend all  scheduled provider appointments Call pharmacy for medication refills 3-7 days in advance of running out of medications Attend church or other social activities Perform all self care activities independently   Follow Up Plan:  Telephone follow up appointment with care management team member scheduled for:  Thursday March 27th at 1:15pm        [facility] Outreach Initial call today. The patient has been in and out of the hospital since January. She originally had a Laminectomy in late January. She ended up falling and  had the flu. She was home for a few weeks and then had another fall and had RSV. The patient did go to SNF in February and was at home with Home Health. During this hospitalization some of her anxiety medications were discontinued and she states she feels mentally clearer. She lives alone with daily check-ins from her son but her works third shift and sleeps during the day. She is responsible for her own medications and meals. [facility] was ordered and will call her in the next 24-48 hours for [facility]. The patient enrolled in the 30 day TOC program.   Deidre Ala, BSN, RN [place]  VBCI - Population Health RN Care Manager [phone]

## 2023-04-25 NOTE — Telephone Encounter (Signed)
 Requested medication (s) are due for refill today -no  Requested medication (s) are on the active medication list -yes-not at this dose  Future visit scheduled -yes  Last refill: unknown  Notes to clinic: non delegated Rx, no longer listed on current medication list at this dose  Requested Prescriptions  Pending Prescriptions Disp Refills   clonazePAM (KLONOPIN) 1 MG tablet [Pharmacy Med Name: CLONAZEPAM 1MG  TABLETS] 45 tablet     Sig: TAKE 1 TABLET BY MOUTH EVERY MORNING, 1/2 TABLET EVERY AFTERNOON, AND 1 TABLET EVERY NIGHT AT BEDTIME     Not Delegated - Psychiatry: Anxiolytics/Hypnotics 2 Failed - 04/25/2023  9:10 AM      Failed - This refill cannot be delegated      Failed - Urine Drug Screen completed in last 360 days      Failed - Valid encounter within last 6 months    Recent Outpatient Visits           2 months ago Preoperative clearance   [place] [facility] [place], Salvadore Oxford, NP   8 months ago Encounter for general adult medical examination with abnormal findings   [place] [facility] [place], Salvadore Oxford, NP   12 months ago COPD exacerbation [facility])   [place] [facility] [place], Salvadore Oxford, NP   1 year ago Mixed hyperlipidemia   [place] [facility] [place], Salvadore Oxford, NP   1 year ago Acquired hypothyroidism   [place] [facility] [place], Salvadore Oxford, [place]              Passed - Patient is not pregnant         Requested Prescriptions  Pending Prescriptions Disp Refills   clonazePAM (KLONOPIN) 1 MG tablet [Pharmacy Med Name: CLONAZEPAM 1MG  TABLETS] 45 tablet     Sig: TAKE 1 TABLET BY MOUTH EVERY MORNING, 1/2 TABLET EVERY AFTERNOON, AND 1 TABLET EVERY NIGHT AT BEDTIME     Not Delegated - Psychiatry: Anxiolytics/Hypnotics 2 Failed - 04/25/2023  9:10 AM      Failed - This refill cannot be delegated      Failed - Urine Drug Screen completed in last 360 days      Failed - Valid  encounter within last 6 months    Recent Outpatient Visits           2 months ago Preoperative clearance   [place] [facility] [place], Salvadore Oxford, NP   8 months ago Encounter for general adult medical examination with abnormal findings   [place] [place] [facility] [place], Salvadore Oxford, NP   12 months ago COPD exacerbation [facility])   [place] [facility] [place], Salvadore Oxford, NP   1 year ago Mixed hyperlipidemia   [place] [facility] [place], Salvadore Oxford, NP   1 year ago Acquired hypothyroidism   [place] [facility] [place], Salvadore Oxford, [place]              Passed - Patient is not pregnant

## 2023-04-29 ENCOUNTER — Ambulatory Visit (INDEPENDENT_AMBULATORY_CARE_PROVIDER_SITE_OTHER): Admitting: Internal Medicine

## 2023-04-29 ENCOUNTER — Encounter: Payer: Self-pay | Admitting: Internal Medicine

## 2023-04-29 VITALS — BP 118/58 | HR 82 | Ht 63.0 in | Wt 192.0 lb

## 2023-04-29 DIAGNOSIS — N3000 Acute cystitis without hematuria: Secondary | ICD-10-CM | POA: Diagnosis not present

## 2023-04-29 DIAGNOSIS — R531 Weakness: Secondary | ICD-10-CM

## 2023-04-29 DIAGNOSIS — B338 Other specified viral diseases: Secondary | ICD-10-CM

## 2023-04-29 DIAGNOSIS — J441 Chronic obstructive pulmonary disease with (acute) exacerbation: Secondary | ICD-10-CM

## 2023-04-29 DIAGNOSIS — J101 Influenza due to other identified influenza virus with other respiratory manifestations: Secondary | ICD-10-CM | POA: Diagnosis not present

## 2023-04-29 DIAGNOSIS — R296 Repeated falls: Secondary | ICD-10-CM

## 2023-04-29 DIAGNOSIS — Z79899 Other long term (current) drug therapy: Secondary | ICD-10-CM | POA: Diagnosis not present

## 2023-04-29 DIAGNOSIS — I509 Heart failure, unspecified: Secondary | ICD-10-CM

## 2023-04-29 MED ORDER — DILTIAZEM HCL ER 120 MG PO TB24: 120.0000 mg | ORAL_TABLET | Freq: Every day | ORAL | Status: AC

## 2023-04-29 NOTE — Progress Notes (Signed)
 Subjective:    Patient ID: [name], female    DOB: 02-Jul-1950, 73 y.o.   MRN: [id number]  HPI  Patient presents to clinic today for TCM hospital follow-up.  She was brought to the ER 2/17 with complaint of altered mental status, generalized weakness and frequent falls.  Initial labs were suggestive of influenza and presumed UTI.  She received a 3-day course of rocephin and a course of tamiflu.  Chest x-ray was negative.  CT of the head was negative for acute findings.  CT abdomen/pelvis did not show any acute findings.  Lower extremity ultrasound was negative for DVT.  They felt like some of her altered mental status was secondary to her cyclobenzaprine, clonazepam and tramadol.  She was discharged from the hospital 2/21 to SNF.  She was discharged from the SNF on 3/3.  She subsequently presented back to the ER 3/12 with complaint of shortness of breath, weakness and a fall.  Her RSV was positive.  She was having a COPD exacerbation as well as an exacerbation of heart failure with a BNP of 501.  She was treated with IV solu-medrol, prednisone, and azithromycin and IV lasix.  She required supplemental oxygen.  Echocardiogram showed an EF greater than 55%.  Ultrasound was negative for DVT.  It was felt that she was having AMS due to polypharmacy.  Her Celexa was held during admission but restarted upon discharge.  Her Klonopin was decreased at a lower dose and her doxepin was discontinued.  Her cardizem and lisinopril also discontinued due to concerns for orthostasis.  She was also started on B12 injections.  They did recommend SNF placement however she was unable to afford this so she was discharged home on 3/18.  Since that time, she continued the cardizem and she has been the cutting the lisinopril in half. She did stop taking her doxepin. She has been taking her clonazepam as prescribed. She denies feeling weak or tired. Her appetite is good. She denies cough or shortness of breath. She has had some  difficulty swallowing pills, even with water. She denies nausea, vomiting or diarrhea. She has chronic joint pain that is not worse than usual. She has not had any swelling in the legs. Overall, she is feeling much better.   Review of Systems     Past Medical History:  Diagnosis Date   Anxiety    Arthritis    Chronic back pain    COPD (chronic obstructive pulmonary disease) (HCC)    Depression    GERD (gastroesophageal reflux disease)    Hypertension    Hypothyroidism    Neuromuscular disorder (HCC)    Pneumonia    RECENT X 3   PONV (postoperative nausea and vomiting)    Psoriasis    Thyroid disease     Current Outpatient Medications  Medication Sig Dispense Refill   acetaminophen (TYLENOL) 500 MG tablet Take 500 mg by mouth every 6 (six) hours as needed for moderate pain.      albuterol (VENTOLIN HFA) 108 (90 Base) MCG/ACT inhaler INHALE 2 PUFFS BY MOUTH EVERY 6 HOURS AS NEEDED FOR WHEEZING OR SHORTNESS OF BREATH 8.5 g 2   aspirin EC (ASPIRIN LOW DOSE) 81 MG tablet TAKE 1 TABLET(81 MG) BY MOUTH DAILY. SWALLOW WHOLE 90 tablet 1   benzonatate (TESSALON) 200 MG capsule Take 1 capsule (200 mg total) by mouth 3 (three) times daily as needed for cough.     carvedilol (COREG) 3.125 MG tablet TAKE 1 TABLET  BY MOUTH TWICE DAILY WITH A MEAL 180 tablet 0   citalopram (CELEXA) 20 MG tablet Take 1 tablet (20 mg total) by mouth daily.     clonazePAM (KLONOPIN) 1 MG tablet TAKE 1 TABLET BY MOUTH EVERY MORNING, 1/2 TABLET EVERY AFTERNOON AND 1 TABLET EVERY NIGHT AT BEDTIME.  SNF use only 12 tablet 0   cyclobenzaprine (FLEXERIL) 10 MG tablet Take 1 tablet (10 mg total) by mouth 3 (three) times daily as needed for muscle spasms. 90 tablet 0   diltiazem (CARDIZEM CD) 120 MG 24 hr capsule TAKE 1 CAPSULE BY MOUTH EVERY DAY 90 capsule 0   doxepin (SINEQUAN) 25 MG capsule Take 50 mg by mouth at bedtime.     fenofibrate (TRICOR) 145 MG tablet Take 1 tablet (145 mg total) by mouth daily. 90 tablet 0    hydrOXYzine (ATARAX) 25 MG tablet Take 25 mg by mouth at bedtime.     ipratropium-albuterol (DUONEB) 0.5-2.5 (3) MG/3ML SOLN Take 3 mLs by nebulization every 6 (six) hours as needed (wheezing / shortness of breath). 60 mL 0   levothyroxine (SYNTHROID) 100 MCG tablet Take 1 tablet (100 mcg total) by mouth daily. 90 tablet 1   lisinopril (ZESTRIL) 10 MG tablet TAKE 1 TABLET(10 MG) BY MOUTH DAILY 90 tablet 0   Misc. Devices (QUAD CANE/SMALL BASE) MISC 1 Device by Does not apply route daily. 1 each 0   pantoprazole (PROTONIX) 40 MG tablet SMARTSIG:1 Tablet(s) By Mouth Every Evening     rosuvastatin (CRESTOR) 20 MG tablet Take 1 tablet (20 mg total) by mouth daily. 90 tablet 3   senna (SENOKOT) 8.6 MG TABS tablet Take 1 tablet (8.6 mg total) by mouth 2 (two) times daily as needed for mild constipation. 100 tablet 0   triamcinolone ointment (KENALOG) 0.1 % Apply 1 Application topically as needed.     No current facility-administered medications for this visit.    Allergies  Allergen Reactions   Ciprofloxacin Hives and Diarrhea   Gabapentin Other (See Comments)    Hallucination   Trazodone And Nefazodone Other (See Comments)    Altered Mental Status    Trazodone Other (See Comments)   Mucinex [Guaifenesin Er] Nausea Only   Oxycodone Diarrhea   Oxycodone-Acetaminophen Diarrhea   Oxycodone-Acetaminophen Palpitations   Sulfa Antibiotics Rash    Throat, mouth Throat, mouth    Family History  Problem Relation Age of Onset   Heart disease Mother    Breast cancer Mother    Heart attack Mother    COPD Father    Stroke Paternal Uncle    Kidney disease Sister    Breast cancer Cousin    Breast cancer Cousin    Heart attack Maternal Aunt    Stroke Paternal Grandfather    Kidney cancer Neg Hx    Bladder Cancer Neg Hx     Social History   Socioeconomic History   Marital status: Widowed    Spouse name: Not on file   Number of children: Not on file   Years of education: Not on file    Highest education level: Not on file  Occupational History   Occupation: retired  Tobacco Use   Smoking status: Former    Current packs/day: 0.00    Average packs/day: 1 pack/day for 35.0 years (35.0 ttl pk-yrs)    Types: Cigarettes    Start date: 04/19/2018    Quit date: 11/16/2020    Years since quitting: 2.4   Smokeless tobacco: Never   Tobacco  comments:    Previously smoked 2 ppd.   Vaping Use   Vaping status: Never Used  Substance and Sexual Activity   Alcohol use: No   Drug use: No   Sexual activity: Not Currently    Birth control/protection: Surgical  Other Topics Concern   Not on file  Social History Narrative   Not on file   Social Drivers of Health   Financial Resource Strain: Low Risk  (04/27/2022)   Overall Financial Resource Strain (CARDIA)    Difficulty of Paying Living Expenses: Not hard at all  Food Insecurity: No Food Insecurity (03/26/2023)   Hunger Vital Sign    Worried About Running Out of Food in the Last Year: Never true    Ran Out of Food in the Last Year: Never true  Transportation Needs: No Transportation Needs (03/26/2023)   PRAPARE - Administrator, Civil Service (Medical): No    Lack of Transportation (Non-Medical): No  Physical Activity: Inactive (04/27/2022)   Exercise Vital Sign    Days of Exercise per Week: 0 days    Minutes of Exercise per Session: 0 min  Stress: No Stress Concern Present (04/27/2022)   [employer] of Occupational Health - Occupational Stress Questionnaire    Feeling of Stress : Only a little  Social Connections: Moderately Isolated (03/26/2023)   Social Connection and Isolation Panel [NHANES]    Frequency of Communication with Friends and Family: More than three times a week    Frequency of Social Gatherings with Friends and Family: Three times a week    Attends Religious Services: More than 4 times per year    Active Member of Clubs or Organizations: No    Attends Banker Meetings: Never     Marital Status: Widowed  Intimate Partner Violence: Not At Risk (03/26/2023)   Humiliation, Afraid, Rape, and Kick questionnaire    Fear of Current or Ex-Partner: No    Emotionally Abused: No    Physically Abused: No    Sexually Abused: No     Constitutional: Denies fever, malaise, fatigue, headache or abrupt weight changes.  HEENT: Denies eye pain, eye redness, ear pain, ringing in the ears, wax buildup, runny nose, nasal congestion, bloody nose, or sore throat. Respiratory: Denies difficulty breathing, shortness of breath, cough or sputum production.   Cardiovascular: Denies chest pain, chest tightness, palpitations or swelling in the hands or feet.  Gastrointestinal: Denies abdominal pain, bloating, constipation, diarrhea or blood in the stool.  GU: Denies urgency, frequency, burning sensation, blood in urine, odor or discharge. Musculoskeletal: Patient reports joint pain.  Denies decrease in range of motion, difficulty with gait, muscle pain or joint swelling.  Skin: Denies redness, rashes, lesions or ulcercations.  Neurological: Patient reports insomnia.  Denies dizziness, difficulty with memory, difficulty with speech or problems with balance and coordination.  Psych: Patient has a history of anxiety and depression.  Denies SI/HI.  No other specific complaints in a complete review of systems (except as listed in HPI above).  Objective:   Physical Exam BP (!) 118/58 (BP Location: Left Arm, Patient Position: Sitting, Cuff Size: Normal)   Pulse 82   Ht 5\' 3"  (1.6 m)   Wt 192 lb (87.1 kg)   SpO2 94%   BMI 34.01 kg/m    Wt Readings from Last 3 Encounters:  03/25/23 187 lb (84.8 kg)  03/13/23 202 lb (91.6 kg)  02/28/23 202 lb 9.6 oz (91.9 kg)    General: Appears her stated  age, obese, in NAD. Skin: Warm, dry and intact. HEENT: Head: normal shape and size; Eyes: sclera white, no icterus, conjunctiva pink, PERRLA and EOMs intact;  Cardiovascular: Normal rate and rhythm. S1,S2  noted.  No murmur, rubs or gallops noted. No JVD or BLE edema. No carotid bruits noted. Pulmonary/Chest: Normal effort and diminished breath sounds. No respiratory distress. No wheezes, rales or ronchi noted.  Musculoskeletal: Strength 5/5 BUE/BLE.  Gait slow and steady with use of rolling walker. Neurological: Alert and oriented. Cranial nerves II-XII grossly intact. Coordination normal.  Psychiatric: Mood and affect normal. Behavior is normal. Judgment and thought content normal.     BMET    Component Value Date/Time   NA 140 03/27/2023 0903   NA 142 06/24/2015 1102   NA 135 (L) 02/02/2013 1021   K 3.2 (L) 03/27/2023 0903   K 3.7 02/02/2013 1021   CL 105 03/27/2023 0903   CL 103 02/02/2013 1021   CO2 27 03/27/2023 0903   CO2 30 02/02/2013 1021   GLUCOSE 120 (H) 03/27/2023 0903   GLUCOSE 152 (H) 02/02/2013 1021   BUN 10 03/27/2023 0903   BUN 12 06/24/2015 1102   BUN 8 02/02/2013 1021   CREATININE 0.87 03/27/2023 0903   CREATININE 1.02 (H) 08/28/2022 1445   CALCIUM 8.5 (L) 03/27/2023 0903   CALCIUM 9.9 02/02/2013 1021   GFRNONAA >60 03/27/2023 0903   GFRNONAA 72 12/03/2019 0947   GFRAA 84 12/03/2019 0947    Lipid Panel     Component Value Date/Time   CHOL 117 08/28/2022 1445   CHOL 205 (H) 02/03/2015 1022   TRIG 197 (H) 08/28/2022 1445   HDL 42 (L) 08/28/2022 1445   HDL 36 (L) 02/03/2015 1022   CHOLHDL 2.8 08/28/2022 1445   LDLCALC 48 08/28/2022 1445    CBC    Component Value Date/Time   WBC 3.8 (L) 03/27/2023 0903   RBC 3.86 (L) 03/27/2023 0903   HGB 11.2 (L) 03/27/2023 0903   HGB 15.6 06/24/2015 1102   HCT 37.2 03/27/2023 0903   HCT 46.6 06/24/2015 1102   PLT 231 03/27/2023 0903   PLT 307 06/24/2015 1102   MCV 96.4 03/27/2023 0903   MCV 93 06/24/2015 1102   MCV 90 02/02/2013 1021   MCH 29.0 03/27/2023 0903   MCHC 30.1 03/27/2023 0903   RDW 14.1 03/27/2023 0903   RDW 13.3 06/24/2015 1102   RDW 13.2 02/02/2013 1021   LYMPHSABS 1.8 03/27/2023 0903    LYMPHSABS 2.3 06/24/2015 1102   MONOABS 0.5 03/27/2023 0903   EOSABS 0.3 03/27/2023 0903   EOSABS 0.3 06/24/2015 1102   BASOSABS 0.1 03/27/2023 0903   BASOSABS 0.1 06/24/2015 1102    Hgb A1C Lab Results  Component Value Date   HGBA1C 6.4 (A) 02/08/2023           Assessment & Plan:   TCM hospital follow-up for metabolic encephalopathy, influenza, RSV, UTI copd exacerbation, chf exacerbation:  Hospital notes, labs and imaging reviewed She will continue Cardizem as she did not stop this upon discharge Will have her discontinue lisinopril as previously requested She will currently take clonazepam 0.5-half tab in a.m., half tab at noon and 1 tab at bedtime Will continue to wean clonazepam  Schedule an appointment for follow-up of chronic conditions Nicki Reaper, NP

## 2023-04-29 NOTE — Patient Instructions (Signed)
 Basics of Medicine Management Taking your medicines correctly is an important part of managing or preventing medical problems. Make sure you know what disease or condition your medicine is treating, and how and when to take it. If you do not take your medicine correctly, it may not work well and may cause unpleasant side effects, including serious health problems. What should I do when I am taking medicines?  Read all the labels and inserts that come with your medicines. Review the information often and with each refill. Talk with your pharmacist if you get a refill and notice a change in the size, color, or shape of your medicines. Know the potential side effects for each medicine that you take. Try to get all your medicines from the same pharmacy. The pharmacist will have all your information and will understand how your medicines will affect each other (interact). Always carry an updated list of your medicines with you. If there is an emergency, a first responder can quickly see what medicines you are taking. Tell your health care provider about all your medicines, including over-the-counter medicines, vitamins, and herbal or dietary supplements. Your health care provider will make sure that nothing will interact with any of your prescribed medicines. How can I take my medicines safely? Take medicines only as told by your health care provider. Do not take more of your medicine than instructed. Do not take anyone else's medicines. Do not share your medicines with others. Do not stop taking your medicines unless your health care provider tells you to do so. You may need to avoid alcohol or certain foods or liquids when taking certain medicines. Follow your health care provider's instructions. Do not split, cut, crush, or chew your medicines unless your health care provider tells you to do so. Tell your health care provider if you have trouble swallowing your medicines. For liquid medicine, use the  dosing container that was provided. Household spoons are not accurate. How should I organize my medicines?  Know your medicines Know what each of your medicines looks like. This includes size, color, and shape. Tell your health care provider if you are having trouble recognizing all the medicines that you are taking. If you cannot tell your medicines apart because they look similar, keep them in the original bottles. If you cannot read the labels on the bottles, tell your pharmacist to put your medicines in containers with large print. Review your medicines and your schedule with family members, a friend, or a caregiver. Use a pill organizer Use a tool to organize your medicine schedule. Tools include a weekly pillbox, a written chart, a notebook, or a calendar. Your tool should help you remember the following things about each medicine: The name of the medicine. The amount (dose) to take. The schedule. This is the day and time the medicine should be taken. The appearance. This includes color, shape, size, and stamp. How to take your medicines. This includes instructions to take them with food, without food, with fluids, or with other medicines. Create reminders for taking your medicines. Use sticky notes, or use alarms on your watch, mobile device, or phone calendar. You may choose to use a more advanced management system. These systems have storage, alarms, and visual and audio prompts. Some medicines can be taken on an "as-needed" basis. These may include medicines for nausea, constipation, pain, cough and cold, allergies, and anxiety. If you take an as-needed medicine, write down the name and dose, as well as the date and time  that you took it. How should I plan for travel? Take your pillbox, medicines, and organization system with you when traveling. Have your medicines refilled before you travel. This will ensure that you do not run out of your medicines while you are away from  home. Always carry an updated list of your medicines with you. If there is an emergency, a first responder can quickly see what medicines you are taking. Do not pack your medicines in checked luggage in case your luggage is lost or delayed. Keep your medicines in your carry-on bag. If any of your medicines is considered a controlled substance, make sure you bring a letter from your health care provider with you. How should I store and discard my medicines? For safe storage: Store medicines in a cool, dry area away from light, or as directed by your health care provider. Do not store medicines in the bathroom. Heat and humidity will affect them. Do not store your medicines with other chemicals or with medicines for pets or other household members. Keep medicines away from children and pets. Do not leave them on counters or bedside tables. Store them in high cabinets or on high shelves. For safe disposal: Check expiration dates regularly. Do not take expired medicines. Discard medicines that are older than the expiration date. Learn a safe way to dispose of your medicines. You may: Use a local government, hospital, or pharmacy medicine-take-back program. If you cannot return the medicine, check the label or package insert to see if the medicine should be thrown out in the garbage or flushed down the toilet. If you are not sure, ask your health care team. If it is safe to put the medicine in the trash, empty the medicine out of the container. Mix the medicine with cat litter, dirt, coffee grounds, or another unwanted substance. Seal the mixture in a bag or container. Put it in the trash. What should I remember? Tell your health care provider if you: Experience side effects. Have new symptoms. Feel that your medicine is no longer working. Have other concerns about taking your medicines. Review your medicines regularly with your health care provider. Other medicines, diet, medical conditions, weight  changes, and daily habits can all affect how medicines work. Ask if you need to continue taking each medicine, and discuss how well each one is working. Refill your medicines early to avoid running out of them. In case of an accidental overdose, call your local poison control center at ([phone])635-1327 or go to your local emergency department right away. Summary Taking your medicines correctly is an important part of managing or preventing medical problems. You need to make sure that you understand what you are taking a medicine for, as well as how and when you need to take it. Use a tool to organize your medicine schedule. Tools include a weekly pillbox, a written chart, a notebook, or a calendar. In case of an accidental overdose, call your local Poison Control Center at ([phone])698-9495 or go to your local emergency department right away. This information is not intended to replace advice given to you by your health care provider. Make sure you discuss any questions you have with your health care provider. Document Revised: 08/30/2020 Document Reviewed: 08/30/2020 Elsevier Patient Education  2024 [employer].

## 2023-04-30 LAB — BASIC METABOLIC PANEL WITH GFR
BUN/Creatinine Ratio: 16 (calc) (ref 6–22)
BUN: 17 mg/dL (ref 7–25)
CO2: 32 mmol/L (ref 20–32)
Calcium: 9.4 mg/dL (ref 8.6–10.4)
Chloride: 103 mmol/L (ref 98–110)
Creat: 1.06 mg/dL — ABNORMAL HIGH (ref 0.60–1.00)
Glucose, Bld: 114 mg/dL (ref 65–139)
Potassium: 4.6 mmol/L (ref 3.5–5.3)
Sodium: 142 mmol/L (ref 135–146)

## 2023-05-01 ENCOUNTER — Ambulatory Visit

## 2023-05-01 DIAGNOSIS — Z87891 Personal history of nicotine dependence: Secondary | ICD-10-CM

## 2023-05-01 NOTE — Progress Notes (Addendum)
 Provider Attestation I agree with the documentation of the Shared Decision Making visit,  smoking cessation counseling if appropriate, and verification or eligibility for lung cancer screening as documented by the RN Nurse Navigator.   Raejean Bullock, MSN, AGACNP-BC [facility] See Amion for personal pager PCCM on call pager [phone]        Virtual Visit via Video Note  I connected with Irene Mannheim on 05/01/23 at  1:00 PM EDT by a video enabled telemedicine application and verified that I am speaking with the correct person using two identifiers.  Location: Patient: in home Provider: [address], [place], [place], [address]     Shared Decision Making Visit Lung Cancer Screening Program [phone])   Eligibility: Age 9 y.o. Pack Years Smoking History Calculation 116 (# packs/per year x # years smoked) Recent History of coughing up blood  no Unexplained weight loss? no ( >Than 15 pounds within the last 6 months ) Prior History Lung / other cancer no (Diagnosis within the last 5 years already requiring surveillance chest CT Scans). Smoking Status Former Smoker Former Smokers: Years since quit: < 1 year  Quit Date: 2024  Visit Components: Discussion included one or more decision making aids. yes Discussion included risk/benefits of screening. yes Discussion included potential follow up diagnostic testing for abnormal scans. yes Discussion included meaning and risk of over diagnosis. yes Discussion included meaning and risk of False Positives. yes Discussion included meaning of total radiation exposure. yes  Counseling Included: Importance of adherence to annual lung cancer LDCT screening. yes Impact of comorbidities on ability to participate in the program. yes Ability and willingness to under diagnostic treatment. yes  Smoking Cessation Counseling: Current Smokers:  Discussed importance of smoking cessation. yes Information  about tobacco cessation classes and interventions provided to patient. yes Patient provided with "ticket" for LDCT Scan. yes Symptomatic Patient. no  Counseling NA Diagnosis Code: Tobacco Use Z72.0 Asymptomatic Patient yes  Counseling (Intermediate counseling: > three minutes counseling) W2956 Former Smokers:  Discussed the importance of maintaining cigarette abstinence. yes Diagnosis Code: Personal History of Nicotine Dependence. O13.086 Information about tobacco cessation classes and interventions provided to patient. Yes Patient provided with "ticket" for LDCT Scan. yes Written Order for Lung Cancer Screening with LDCT placed in Epic. Yes (CT Chest Lung Cancer Screening Low Dose W/O CM) [id number] Z12.2-Screening of respiratory organs Z87.891-Personal history of nicotine dependence   Valentin Gaskins, RN 05/01/23

## 2023-05-01 NOTE — Patient Instructions (Signed)

## 2023-05-02 ENCOUNTER — Other Ambulatory Visit: Payer: Self-pay

## 2023-05-02 NOTE — Patient Outreach (Signed)
 Care Management  Transitions of Care Program Transitions of Care Post-discharge week 2   05/02/2023 Name: [name] MRN: [id number] DOB: 1950/12/15  Subjective: [name] is a 73 y.o. year old female who is a primary care patient of Lorre Munroe, NP. The Care Management team Engaged with patient Engaged with patient by telephone to assess and address transitions of care needs.   Consent to Services:  Patient was given information about care management services, agreed to services, and gave verbal consent to participate.   Assessment: TOC Outreach completed today. The patient did go to her PCP appointment. Her provider is weaning down her Klonopin and the patient states she doesn't want to be weaned off completely because of anxiety. She remains on Citalopram. She feels that her strength is returning. She receives HHPT. She has a CT tomorrow for Lung cancer screening. She wears oxygen at night as needed. She does not have a pulse oximeter and she states if she feels SOB she wears it. RNCM to contact the patient in a week for education and support.     SDOH Interventions    Flowsheet Row Telephone from 04/24/2023 in [facility] POPULATION HEALTH DEPARTMENT Telephone from 03/11/2023 in [facility] HEALTH POPULATION HEALTH DEPARTMENT Office Visit from 08/28/2022 in [facility] Clinical Support from 04/27/2022 in [facility] Office Visit from 04/26/2022 in [facility] Office Visit from 03/05/2022 in [facility]  SDOH Interventions        Food Insecurity Interventions Intervention Not Indicated Intervention Not Indicated -- Intervention Not Indicated -- --  Housing Interventions Intervention Not Indicated Intervention Not Indicated -- Intervention Not Indicated -- --  Transportation Interventions Intervention Not Indicated Intervention Not Indicated, Patient Resources (Friends/Family) --  Intervention Not Indicated, Patient Resources (Friends/Family) -- --  Utilities Interventions Intervention Not Indicated Intervention Not Indicated -- Intervention Not Indicated -- --  Alcohol Usage Interventions -- -- Intervention Not Indicated (Score <7) Intervention Not Indicated (Score <7) -- --  Depression Interventions/Treatment  -- -- Currently on Treatment -- Currently on Treatment Currently on Treatment  Financial Strain Interventions -- -- -- Intervention Not Indicated -- --  Physical Activity Interventions -- -- -- Patient Refused, Other (Comments)  [getting ready to have P.T.] -- --  Stress Interventions -- -- -- Intervention Not Indicated -- --  Social Connections Interventions -- -- -- Intervention Not Indicated -- --        Goals Addressed             This Visit's Progress    TOC 30 Outreach Program   On track    Current Barriers: (reviewed 05/02/23) Knowledge Deficits related to plan of care for management of COPD and Anxiety  Chronic Disease Management support and education needs related to COPD and Anxiety   RNCM Clinical Goal(s): (reviewed 05/02/23) Patient will work with the Care Management team over the next 30 days to address Transition of Care Barriers: Medication access Medication Management Diet/Nutrition/Food Resources Support at home Provider appointments Home Health services verbalize understanding of plan for management of COPD and Anxiety as evidenced by EMR, no hospital re-admissions in the next 30 days  through collaboration with RN Care manager, provider, and care team.   Interventions:(reviewed 05/02/23) Evaluation of current treatment plan related to  self management and patient's adherence to plan as established by provider   COPD Interventions:  (Status:  Goal on track:  Yes.)  Short Term Goal (reviewed 05/02/23) Provided patient with basic written and verbal COPD education on self care/management/and exacerbation prevention Advised patient to  track and manage COPD triggers Provided instruction about proper use of medications used for management of COPD including inhalers Advised patient to engage in light exercise as tolerated 3-5 days a week to aid in the the management of COPD Provided education about and advised patient to utilize infection prevention strategies to reduce risk of respiratory infection Discussed the importance of adequate rest and management of fatigue with COPD Lung Cancer screening follow up  Patient Goals/Self-Care Activities: (reviewed 05/02/23) Participate in Transition of Care Program/Attend [facility] scheduled calls Notify RN Care Manager of [facility] call rescheduling needs Take all medications as prescribed Attend all scheduled provider appointments Call pharmacy for medication refills 3-7 days in advance of running out of medications Attend church or other social activities Perform all self care activities independently   Follow Up Plan:  Telephone follow up appointment with care management team member scheduled for:  Thursday March 27th at 1:15pm         Plan: The patient has been provided with contact information for the care management team and has been advised to call with any health related questions or concerns.   Please refer to Care Plan for goals and interventions.  Patient educated on red flags signs/symptoms to watch for and was encouraged to report any of these identified, any new symptoms, changes in baseline or medication regimen, change in health status / well-being, or safety concerns to PCP and / or the [facility] Case Management team.   The patient has been provided with contact information for the care management team and has been advised to call with any health-related questions or concerns. The patient verbalized understanding with current POC. The patient is directed to their insurance card regarding availability of benefits coverage.   Deidre Ala, BSN, RN [place]  [facility] - [employer]  Health RN Care Manager [phone]

## 2023-05-03 ENCOUNTER — Ambulatory Visit: Payer: PPO

## 2023-05-03 ENCOUNTER — Ambulatory Visit
Admission: RE | Admit: 2023-05-03 | Discharge: 2023-05-03 | Disposition: A | Discharge status: Home / Self Care | Source: Ambulatory Visit | Attending: Internal Medicine | Admitting: Internal Medicine

## 2023-05-03 DIAGNOSIS — Z122 Encounter for screening for malignant neoplasm of respiratory organs: Secondary | ICD-10-CM | POA: Insufficient documentation

## 2023-05-03 DIAGNOSIS — Z0000 Encounter for general adult medical examination without abnormal findings: Secondary | ICD-10-CM

## 2023-05-03 DIAGNOSIS — Z87891 Personal history of nicotine dependence: Secondary | ICD-10-CM | POA: Diagnosis not present

## 2023-05-03 NOTE — Progress Notes (Signed)
 Subjective:   [name] is a 73 y.o. who presents for a Medicare Wellness preventive visit.  Visit Complete: Virtual I connected with  [name] on 05/03/23 by a audio enabled telemedicine application and verified that I am speaking with the correct person using two identifiers.  Patient Location: Home  Provider Location: Office/Clinic  I discussed the limitations of evaluation and management by telemedicine. The patient expressed understanding and agreed to proceed.  Vital Signs: Because this visit was a virtual/telehealth visit, some criteria may be missing or patient reported. Any vitals not documented were not able to be obtained and vitals that have been documented are patient reported.  VideoDeclined- This patient declined Librarian, academic. Therefore the visit was completed with audio only.  Persons Participating in Visit: Patient.  AWV Questionnaire: No: Patient Medicare AWV questionnaire was not completed prior to this visit.  Cardiac Risk Factors include: advanced age (>95men, >93 women);diabetes mellitus;dyslipidemia;hypertension;obesity (BMI >30kg/m2);sedentary lifestyle     Objective:    Today's Vitals   05/03/23 1519  PainSc: 0-No pain   There is no height or weight on file to calculate BMI.     05/03/2023    3:25 PM 04/18/2023    1:07 PM 04/17/2023   11:18 AM 03/25/2023   10:52 AM 02/28/2023   11:17 AM 02/14/2023    1:49 PM 01/10/2023   12:48 AM  Advanced Directives  Does Patient Have a Medical Advance Directive? No No No No No No Yes  Would patient like information on creating a medical advance directive? No - Patient declined No - Patient declined  No - Patient declined No - Patient declined No - Patient declined     Current Medications (verified) Outpatient Encounter Medications as of 05/03/2023  Medication Sig   acetaminophen (TYLENOL) 500 MG tablet Take 500 mg by mouth every 6 (six) hours as needed for moderate pain.     albuterol (VENTOLIN HFA) 108 (90 Base) MCG/ACT inhaler INHALE 2 PUFFS BY MOUTH EVERY 6 HOURS AS NEEDED FOR WHEEZING OR SHORTNESS OF BREATH   aspirin EC (ASPIRIN LOW DOSE) 81 MG tablet TAKE 1 TABLET(81 MG) BY MOUTH DAILY. SWALLOW WHOLE   citalopram (CELEXA) 20 MG tablet Take 1 tablet (20 mg total) by mouth daily.   clonazePAM (KLONOPIN) 1 MG tablet TAKE 1 TABLET BY MOUTH EVERY MORNING, 1/2 TABLET EVERY AFTERNOON, AND 1 TABLET EVERY NIGHT AT BEDTIME (Patient taking differently: Take 1 mg by mouth 3 (three) times daily as needed. TAKE 0.5 TABLET BY MOUTH EVERY MORNING, 1/2 TABLET EVERY AFTERNOON AND 1 TABLET EVERY NIGHT AT BEDTIME)   cyanocobalamin 1000 MCG tablet Take 1 tablet (1,000 mcg total) by mouth daily.   diltiazem (CARDIZEM LA) 120 MG 24 hr tablet Take 1 tablet (120 mg total) by mouth daily.   fenofibrate (TRICOR) 145 MG tablet Take 1 tablet (145 mg total) by mouth daily.   levothyroxine (SYNTHROID) 100 MCG tablet Take 1 tablet (100 mcg total) by mouth daily.   Misc. Devices (QUAD CANE/SMALL BASE) MISC 1 Device by Does not apply route daily.   OXYGEN Inhale 2 L/min into the lungs as needed.   pantoprazole (PROTONIX) 40 MG tablet SMARTSIG:1 Tablet(s) By Mouth Every Evening   rosuvastatin (CRESTOR) 20 MG tablet Take 1 tablet (20 mg total) by mouth daily.   senna (SENOKOT) 8.6 MG TABS tablet Take 1 tablet (8.6 mg total) by mouth 2 (two) times daily as needed for mild constipation.   triamcinolone ointment (KENALOG)  0.1 % Apply 1 Application topically as needed.   carvedilol (COREG) 3.125 MG tablet TAKE 1 TABLET BY MOUTH TWICE DAILY WITH A MEAL (Patient not taking: Reported on 05/03/2023)   No facility-administered encounter medications on file as of 05/03/2023.    Allergies (verified) Ciprofloxacin, Gabapentin, Trazodone and nefazodone, Trazodone, Mucinex [guaifenesin er], Oxycodone, and Sulfa antibiotics   History: Past Medical History:  Diagnosis Date   Anxiety    Arthritis    Chronic  back pain    COPD (chronic obstructive pulmonary disease) (HCC)    Depression    GERD (gastroesophageal reflux disease)    Hypertension    Hypothyroidism    Neuromuscular disorder (HCC)    Pneumonia    RECENT X 3   PONV (postoperative nausea and vomiting)    Psoriasis    Thyroid disease    Past Surgical History:  Procedure Laterality Date   ABDOMINAL HYSTERECTOMY     heavy bleeding   bladder stimulator     CARPAL TUNNEL RELEASE     CATARACT EXTRACTION W/PHACO Right 03/07/2015   Procedure: CATARACT EXTRACTION PHACO AND INTRAOCULAR LENS PLACEMENT (IOC);  Surgeon: Sallee Lange, MD;  Location: [facility] ORS;  Service: Ophthalmology;  Laterality: Right;  [name] 01:14 AP% 26.4 CDE 33.04 fluid pack lot # [id number]   CATARACT EXTRACTION W/PHACO Left 03/21/2015   Procedure: CATARACT EXTRACTION PHACO AND INTRAOCULAR LENS PLACEMENT (IOC);  Surgeon: Sallee Lange, MD;  Location: [facility] OR[facility];  Service: Ophthalmology;  Laterality: Left;  [name] 01:21 AP% 25.6 CDE 39.78 fluid pack lot # [id number]   HAND SURGERY Right    INTERSTIM IMPLANT REMOVAL N/A 09/07/2015   Procedure: REMOVAL OF INTERSTIM IMPLANT;  Surgeon: Alfredo Martinez, MD;  Location: [facility] ORS;  Service: Urology;  Laterality: N/A;   LUMBAR LAMINECTOMY/DECOMPRESSION MICRODISCECTOMY N/A 02/28/2023   Procedure: L3-4 LAMINECTOMY, L4-5 LAMINECTOMY AND DISCECTOMY OF LEFT SIDE, L5-S1 LAMINECTOMY;  Surgeon: Lovenia Kim, MD;  Location: [facility] ORS;  Service: Neurosurgery;  Laterality: N/A;   OOPHORECTOMY Right    ovarian cyst   Family History  Problem Relation Age of Onset   Heart disease Mother    Breast cancer Mother    Heart attack Mother    COPD Father    Stroke Paternal Uncle    Kidney disease Sister    Breast cancer Cousin    Breast cancer Cousin    Heart attack Maternal Aunt    Stroke Paternal Grandfather    Kidney cancer Neg Hx    Bladder Cancer Neg Hx    Social History   Socioeconomic History   Marital status: Widowed    Spouse  name: Not on file   Number of children: Not on file   Years of education: Not on file   Highest education level: Not on file  Occupational History   Occupation: retired  Tobacco Use   Smoking status: Former    Current packs/day: 0.00    Average packs/day: 1 pack/day for 35.0 years (35.0 ttl pk-yrs)    Types: Cigarettes    Start date: 04/19/2018    Quit date: 11/16/2020    Years since quitting: 2.4   Smokeless tobacco: Never   Tobacco comments:    Previously smoked 2 ppd.   Vaping Use   Vaping status: Never Used  Substance and Sexual Activity   Alcohol use: No   Drug use: No   Sexual activity: Not Currently    Birth control/protection: Surgical  Other Topics Concern   Not on file  Social History  Narrative   Not on file   Social Drivers of Health   Financial Resource Strain: Low Risk  (05/03/2023)   Overall Financial Resource Strain (CARDIA)    Difficulty of Paying Living Expenses: Not hard at all  Food Insecurity: No Food Insecurity (05/03/2023)   Hunger Vital Sign    Worried About Running Out of Food in the Last Year: Never true    Ran Out of Food in the Last Year: Never true  Transportation Needs: No Transportation Needs (05/03/2023)   PRAPARE - Administrator, Civil Service (Medical): No    Lack of Transportation (Non-Medical): No  Physical Activity: Insufficiently Active (05/03/2023)   Exercise Vital Sign    Days of Exercise per Week: 2 days    Minutes of Exercise per Session: 20 min  Stress: No Stress Concern Present (05/03/2023)   [employer] of Occupational Health - Occupational Stress Questionnaire    Feeling of Stress : Only a little  Social Connections: Moderately Integrated (05/03/2023)   Social Connection and Isolation Panel [NHANES]    Frequency of Communication with Friends and Family: More than three times a week    Frequency of Social Gatherings with Friends and Family: Three times a week    Attends Religious Services: More than 4 times  per year    Active Member of Clubs or Organizations: Yes    Attends Banker Meetings: More than 4 times per year    Marital Status: Widowed  Recent Concern: Social Connections - Moderately Isolated (03/26/2023)   Social Connection and Isolation Panel [NHANES]    Frequency of Communication with Friends and Family: More than three times a week    Frequency of Social Gatherings with Friends and Family: Three times a week    Attends Religious Services: More than 4 times per year    Active Member of Clubs or Organizations: No    Attends Banker Meetings: Never    Marital Status: Widowed    Tobacco Counseling Counseling given: Not Answered Tobacco comments: Previously smoked 2 ppd.     Clinical Intake:  Pre-visit preparation completed: Yes  Pain : No/denies pain Pain Score: 0-No pain     BMI - recorded: 34 Nutritional Status: BMI > 30  Obese Nutritional Risks: None Diabetes: No  Lab Results  Component Value Date   HGBA1C 6.4 (A) 02/08/2023   HGBA1C 6.9 (H) 08/28/2022   HGBA1C 6.3 03/05/2022     How often do you need to have someone help you when you read instructions, pamphlets, or other written materials from your doctor or pharmacy?: 1 - Never  Interpreter Needed?: No  Information entered by :: Kennedy Bucker, LPN   Activities of Daily Living     05/03/2023    3:26 PM 04/18/2023    1:11 PM  In your present state of health, do you have any difficulty performing the following activities:  Hearing? 0   Vision? 0   Difficulty concentrating or making decisions? 0   Walking or climbing stairs? 0   Dressing or bathing? 0   Doing errands, shopping? 0 0  Preparing Food and eating ? N   Using the Toilet? N   In the past six months, have you accidently leaked urine? N   Do you have problems with loss of bowel control? N   Managing your Medications? N   Managing your Finances? N   Housekeeping or managing your Housekeeping? N     Patient Care  Team: Lorre Munroe, NP as PCP - General (Internal Medicine) Sallee Lange, MD as Consulting Physician (Ophthalmology) Jesusita Oka, MD as Consulting Physician (Dermatology) Lamar Blinks, MD as Consulting Physician (Cardiology) Minor, Theadora Rama, RN (Inactive) as Case Manager Homsher, Wynona Canes, RN as VBCI Care Management  Indicate any recent Medical Services you may have received from other than [facility] providers in the past year (date may be approximate).     Assessment:   This is a routine wellness examination for [name].  Hearing/Vision screen Hearing Screening - Comments:: NO AIDS Vision Screening - Comments:: READERS- [facility]   Goals Addressed             This Visit's Progress    DIET - REDUCE SUGAR INTAKE         Depression Screen     05/03/2023    3:23 PM 02/08/2023    4:17 PM 08/28/2022    2:54 PM 04/27/2022    8:48 AM 04/26/2022    1:40 PM 03/05/2022   12:32 PM 07/25/2021   10:18 AM  PHQ 2/9 Scores  PHQ - 2 Score 0 0 2 1 3 2  0  PHQ- 9 Score 0  6 3 5 9 1     Fall Risk     05/03/2023    3:26 PM 02/08/2023    4:17 PM 08/28/2022    2:54 PM 04/27/2022    8:50 AM 04/26/2022    1:40 PM  Fall Risk   Falls in the past year? 1 1 1  0 0  Number falls in past yr: 1 1 0 0   Injury with Fall? 0  0 0 0  Risk for fall due to : History of fall(s)   Impaired mobility Impaired balance/gait  Follow up Falls evaluation completed;Falls prevention discussed   Falls prevention discussed;Falls evaluation completed     MEDICARE RISK AT HOME:  Medicare Risk at Home Any stairs in or around the home?: No If so, are there any without handrails?: No Home free of loose throw rugs in walkways, pet beds, electrical cords, etc?: Yes Adequate lighting in your home to reduce risk of falls?: Yes Life alert?: No Use of a cane, walker or w/c?: Yes (WALKER) Grab bars in the bathroom?: Yes Shower chair or bench in shower?: No Elevated toilet seat or a handicapped toilet?:  Yes  TIMED UP AND GO:  Was the test performed?  No  Cognitive Function: 6CIT completed        05/03/2023    3:29 PM 04/27/2022    8:59 AM 04/12/2020    9:10 AM 05/14/2017   10:13 AM 05/07/2016    1:47 PM  6CIT Screen  What Year? 0 points 0 points 0 points 0 points 0 points  What month? 0 points 0 points 0 points 0 points 0 points  What time? 0 points 0 points 0 points 0 points 0 points  Count back from 20 0 points 0 points 0 points 0 points 0 points  Months in reverse 0 points 0 points 0 points 0 points 0 points  Repeat phrase 2 points 0 points 2 points 0 points 4 points  Total Score 2 points 0 points 2 points 0 points 4 points    Immunizations Immunization History  Administered Date(s) Administered   Fluad Quad(high Dose 65+) 12/03/2019   Fluad Trivalent(High Dose 65+) 01/05/2023   Influenza, High Dose Seasonal PF 01/07/2015, 11/14/2015, 11/06/2016, 11/20/2017   Influenza, Seasonal, Injecte, Preservative Fre 10/27/2008, 12/13/2010, 11/05/2012  Influenza,inj,Quad PF,6+ Mos 10/26/2013   Influenza-Unspecified 10/06/2013   PNEUMOCOCCAL CONJUGATE-20 02/08/2023   PPD Test 01/09/2023, 03/29/2023, 04/06/2023   Pneumococcal Conjugate-13 05/14/2017   Pneumococcal Polysaccharide-23 11/25/2013, 04/08/2019    Screening Tests Health Maintenance  Topic Date Due   COVID-19 Vaccine (1) Never done   OPHTHALMOLOGY EXAM  Never done   Zoster Vaccines- Shingrix (1 of 2) Never done   Fecal DNA (Cologuard)  Never done   Lung Cancer Screening  12/07/2021   Diabetic kidney evaluation - Urine ACR  03/06/2023   FOOT EXAM  03/06/2023   MAMMOGRAM  08/28/2023 (Originally 08/30/2017)   HEMOGLOBIN A1C  08/08/2023   Diabetic kidney evaluation - eGFR measurement  04/28/2024   Medicare Annual Wellness (AWV)  05/02/2024   Pneumonia Vaccine 62+ Years old  Completed   INFLUENZA VACCINE  Completed   DEXA SCAN  Completed   Hepatitis C Screening  Completed   HPV VACCINES  Aged Out   DTaP/Tdap/Td   Discontinued    Health Maintenance  Health Maintenance Due  Topic Date Due   COVID-19 Vaccine (1) Never done   OPHTHALMOLOGY EXAM  Never done   Zoster Vaccines- Shingrix (1 of 2) Never done   Fecal DNA (Cologuard)  Never done   Lung Cancer Screening  12/07/2021   Diabetic kidney evaluation - Urine ACR  03/06/2023   FOOT EXAM  03/06/2023   Health Maintenance Items Addressed: UP TO DATE ON LUNG CA SCREEN. DECLINED COLONOSCOPY, HAS ORDER FOR MAMMOGRAM - NEEDS SHINGRIX SHOTS  Additional Screening:  Vision Screening: Recommended annual ophthalmology exams for early detection of glaucoma and other disorders of the eye.  Dental Screening: Recommended annual dental exams for proper oral hygiene  Community Resource Referral / Chronic Care Management: CRR required this visit?  No   CCM required this visit?  No     Plan:     I have personally reviewed and noted the following in the patient's chart:   Medical and social history Use of alcohol, tobacco or illicit drugs  Current medications and supplements including opioid prescriptions. Patient is not currently taking opioid prescriptions. Functional ability and status Nutritional status Physical activity Advanced directives List of other physicians Hospitalizations, surgeries, and ER visits in previous 12 months Vitals Screenings to include cognitive, depression, and falls Referrals and appointments  In addition, I have reviewed and discussed with patient certain preventive protocols, quality metrics, and best practice recommendations. A written personalized care plan for preventive services as well as general preventive health recommendations were provided to patient.     Hal Hope, LPN   1/61/0960   After Visit Summary: (MyChart) Due to this being a telephonic visit, the after visit summary with patients personalized plan was offered to patient via MyChart   Notes: Nothing significant to report at this  time.

## 2023-05-03 NOTE — Patient Instructions (Addendum)
 Ms. Line , Thank you for taking time to come for your Medicare Wellness Visit. I appreciate your ongoing commitment to your health goals. Please review the following plan we discussed and let me know if I can assist you in the future.   Referrals/Orders/Follow-Ups/Clinician Recommendations: NONE  This is a list of the screening recommended for you and due dates:  Health Maintenance  Topic Date Due   COVID-19 Vaccine (1) Never done   Eye exam for diabetics  Never done   Zoster (Shingles) Vaccine (1 of 2) Never done   Cologuard (Stool DNA test)  Never done   Screening for Lung Cancer  12/07/2021   Yearly kidney health urinalysis for diabetes  03/06/2023   Complete foot exam   03/06/2023   Mammogram  08/28/2023*   Hemoglobin A1C  08/08/2023   Yearly kidney function blood test for diabetes  04/28/2024   Medicare Annual Wellness Visit  05/02/2024   Pneumonia Vaccine  Completed   Flu Shot  Completed   DEXA scan (bone density measurement)  Completed   Hepatitis C Screening  Completed   HPV Vaccine  Aged Out   DTaP/Tdap/Td vaccine  Discontinued  *Topic was postponed. The date shown is not the original due date.    Advanced directives: (ACP Link)Information on Advanced Care Planning can be found at [facility] of [place] Advance Health Care Directives Advance Health Care Directives. [email or web address]   Next Medicare Annual Wellness Visit scheduled for next year: Yes  05/08/24 @ 2:40 PM BY PHONE

## 2023-05-09 ENCOUNTER — Other Ambulatory Visit: Payer: Self-pay

## 2023-05-09 NOTE — Patient Outreach (Signed)
 Care Management  Transitions of Care Program Transitions of Care Post-discharge week 3   05/09/2023 Name: [name] MRN: [id number] DOB: 1950-05-17  Subjective: [name] is a 73 y.o. year old female who is a primary care patient of Lorre Munroe, NP. The Care Management team Engaged with patient Engaged with patient by telephone to assess and address transitions of care needs.   Consent to Services:  Patient was given information about care management services, agreed to services, and gave verbal consent to participate.   Assessment: TOC Outreach completed to the patient today. She states she feels better and her stamina is improving each week. She continues to work with PT and she gets up and walks around her home periodically throughout the day. She does use her walker as her balance can be unsteady. She continues with her Klonopin regime which is regulating her anxiety. She has been able to leave her home to attend church and Bible study.          SDOH Interventions    Flowsheet Row Clinical Support from 05/03/2023 in [facility] Telephone from 04/24/2023 in [place] [facility] DEPARTMENT Telephone from 03/11/2023 in [facility] HEALTH POPULATION HEALTH DEPARTMENT Office Visit from 08/28/2022 in [facility] Clinical Support from 04/27/2022 in [facility] Office Visit from 04/26/2022 in [facility]  SDOH Interventions        Food Insecurity Interventions Intervention Not Indicated Intervention Not Indicated Intervention Not Indicated -- Intervention Not Indicated --  Housing Interventions Intervention Not Indicated Intervention Not Indicated Intervention Not Indicated -- Intervention Not Indicated --  Transportation Interventions Intervention Not Indicated, Patient Resources (Friends/Family) Intervention Not Indicated Intervention Not Indicated, Patient Resources (Friends/Family)  -- Intervention Not Indicated, Patient Resources (Friends/Family) --  Utilities Interventions Intervention Not Indicated Intervention Not Indicated Intervention Not Indicated -- Intervention Not Indicated --  Alcohol Usage Interventions Intervention Not Indicated (Score <7) -- -- Intervention Not Indicated (Score <7) Intervention Not Indicated (Score <7) --  Depression Interventions/Treatment  PHQ2-9 Score <4 Follow-up Not Indicated -- -- Currently on Treatment -- Currently on Treatment  Financial Strain Interventions Intervention Not Indicated -- -- -- Intervention Not Indicated --  Physical Activity Interventions Intervention Not Indicated -- -- -- Patient Refused, Other (Comments)  [getting ready to have P.T.] --  Stress Interventions Intervention Not Indicated -- -- -- Intervention Not Indicated --  Social Connections Interventions Intervention Not Indicated -- -- -- Intervention Not Indicated --  Health Literacy Interventions Intervention Not Indicated -- -- -- -- --        Goals Addressed             This Visit's Progress    TOC 30 Outreach Program   On track    Current Barriers: (reviewed 05/09/23) Knowledge Deficits related to plan of care for management of COPD and Anxiety  Chronic Disease Management support and education needs related to COPD and Anxiety   RNCM Clinical Goal(s): (reviewed 05/09/23) Patient will work with the Care Management team over the next 30 days to address Transition of Care Barriers: Medication access Medication Management Diet/Nutrition/Food Resources Support at home Provider appointments Home Health services verbalize understanding of plan for management of COPD and Anxiety as evidenced by EMR, no hospital re-admissions in the next 30 days  through collaboration with RN Care manager, provider, and care team.   Interventions: (reviewed 05/09/23) Evaluation of current treatment plan related  to  self management and patient's adherence to plan as  established by provider   COPD Interventions:  (Status:  Goal on track:  Yes.) Short Term Goal (reviewed 05/09/23) Provided patient with basic written and verbal COPD education on self care/management/and exacerbation prevention Advised patient to track and manage COPD triggers Provided instruction about proper use of medications used for management of COPD including inhalers Advised patient to engage in light exercise as tolerated 3-5 days a week to aid in the the management of COPD Provided education about and advised patient to utilize infection prevention strategies to reduce risk of respiratory infection Discussed the importance of adequate rest and management of fatigue with COPD Lung Cancer screening follow up - Completed 05/03/23 Oxygen 2l/min as needed for SOB  Patient Goals/Self-Care Activities:  (reviewed 05/09/23) Participate in Transition of Care Program/Attend [facility] scheduled calls Notify RN Care Manager of [facility] call rescheduling needs Take all medications as prescribed Attend all scheduled provider appointments Call pharmacy for medication refills 3-7 days in advance of running out of medications Attend church or other social activities Perform all self care activities independently   Follow Up Plan:  Telephone follow up appointment with care management team member scheduled for:  Thursday April 10th at 10am.         Plan: The patient has been provided with contact information for the care management team and has been advised to call with any health related questions or concerns.   Routine follow-up and on-going assessment evaluation and education of disease processes, and recommended interventions for both chronic and acute medical conditions, will occur during each weekly visit during [facility] 30-day Program Outreach calls along with ongoing review of symptoms, medication reviews and reconciliation. Any updates, inconsistencies, discrepancies or acute care concerns will be  addressed on the Care Plan and routed to the correct Practitioner if indicated.    The patient has been provided with contact information for the care management team and has been advised to call with any health-related questions or concerns. The patient verbalized understanding with current POC. The patient is directed to their insurance card regarding availability of benefits coverage.  Deidre Ala, BSN, RN [place]  VBCI - [employer] [facility] RN Care Manager [phone]

## 2023-05-16 ENCOUNTER — Other Ambulatory Visit: Payer: Self-pay

## 2023-05-16 NOTE — Patient Outreach (Signed)
 Transition of Care week 4  Visit Note  05/16/2023  Name: [name] MRN: [id number]          DOB: [birth year]  Situation: Patient enrolled in [facility] 30-day program. Visit completed with Angie Fava by telephone.   Background:     Past Medical History:  Diagnosis Date   Anxiety    Arthritis    Chronic back pain    COPD (chronic obstructive pulmonary disease) (HCC)    Depression    GERD (gastroesophageal reflux disease)    Hypertension    Hypothyroidism    Neuromuscular disorder (HCC)    Pneumonia    RECENT X 3   PONV (postoperative nausea and vomiting)    Psoriasis    Thyroid disease     Assessment: TOC Outreach completed to the patient. She reported two falls in the last two days. The first fall she skinned her knees and the second fall she landed on her hip and has been in pain. Encouraged the patient to call EMS and go to the ED for an xray. She agrees but she wants to wait for her son to wake up from working 3rd shift before she goes. He does not wake up until 3:30pm. Recommended either waking him up or go without him and she states she doesn't want to go until he wakes up. The patient is unsure how she fell. She has been working with HHPT. She did not trip on oxygen tubing. Reviewed anxiety level and she reports she took her medication. RNCM to follow up on outcome of fall and xray.  Patient Reported Symptoms:  Cognitive Alert and oriented to person, place, and time  Neurological No symptoms reported    HEENT No symptoms reported    Cardiovascular No symptoms reported    Respiratory Shortness of breath, Dry cough    Endocrine No symptoms reported    Gastrointestinal No symptoms reported    Genitourinary No symptoms reported    Integumentary No symptoms reported    Musculoskeletal Unsteady gait, Weakness, Difficulty walking    Psychosocial Anxiety - if selected complete GAD     There were no vitals filed for this visit.  Medications Reviewed Today      Reviewed by Redge Gainer, RN (Case Manager) on 05/16/23 at 1031  Med List Status: <None>   Medication Order Taking? Sig Documenting Provider Last Dose Status Informant  acetaminophen (TYLENOL) 500 MG tablet [id number] No Take 500 mg by mouth every 6 (six) hours as needed for moderate pain.  [provider] Taking Active Pharmacy Records, Family Member           Med Note Maisie Fus, Jolly Mango   Wed Apr 17, 2023  4:20 PM) prn  albuterol (VENTOLIN HFA) 108 (90 Base) MCG/ACT inhaler [id number] No INHALE 2 PUFFS BY MOUTH EVERY 6 HOURS AS NEEDED FOR WHEEZING OR SHORTNESS OF Barbaraann Barthel, DO Taking Active Pharmacy Records, Family Member           Med Note Clarene Reamer, MARIA   Tue Mar 26, 2023 10:56 AM) prn  aspirin EC (ASPIRIN LOW DOSE) 81 MG tablet [id number] No TAKE 1 TABLET(81 MG) BY MOUTH DAILY. SWALLOW WHOLE Baity, Salvadore Oxford, NP Taking Active Pharmacy Records, Family Member  carvedilol (COREG) 3.125 MG tablet [id number] No TAKE 1 TABLET BY MOUTH TWICE DAILY WITH A MEAL  Patient not taking: Reported on 05/03/2023   Lorre Munroe, NP Not Taking Active Family Member, Pharmacy Records  citalopram (CELEXA) 20 MG tablet  [id number] No Take 1 tablet (20 mg total) by mouth daily. Tresa Moore, MD Taking Active Family Member, Pharmacy Records  clonazePAM [facility]) 1 MG tablet [id number] No TAKE 1 TABLET BY MOUTH EVERY MORNING, 1/2 TABLET EVERY AFTERNOON, AND 1 TABLET EVERY NIGHT AT BEDTIME  Patient taking differently: Take 1 mg by mouth 3 (three) times daily as needed. TAKE 0.5 TABLET BY MOUTH EVERY MORNING, 1/2 TABLET EVERY AFTERNOON AND 1 TABLET EVERY NIGHT AT BEDTIME   Lorre Munroe, NP Taking Active   cyanocobalamin 1000 MCG tablet [id number] No Take 1 tablet (1,000 mcg total) by mouth daily. Gillis Santa, MD Taking Active   diltiazem (CARDIZEM LA) 120 MG 24 hr tablet [id number] No Take 1 tablet (120 mg total) by mouth daily. Lorre Munroe, NP Taking Active   fenofibrate  (TRICOR) 145 MG tablet [id number] No Take 1 tablet (145 mg total) by mouth daily. Lorre Munroe, NP Taking Active Family Member, Pharmacy Records  levothyroxine (SYNTHROID) 100 MCG tablet [id number] No Take 1 tablet (100 mcg total) by mouth daily. Lorre Munroe, NP Taking Active Pharmacy Records, Family Member  Misc. Devices (QUAD CANE/SMALL BASE) MISC [id number] No 1 Device by Does not apply route daily. Galen Manila, NP Taking Active Pharmacy Records, Family Member  OXYGEN [id number] No Inhale 2 L/min into the lungs as needed. [provider] Taking Active   pantoprazole (PROTONIX) 40 MG tablet [id number] No SMARTSIG:1 Tablet(s) By Mouth Every Evening [provider] Taking Active Pharmacy Records, Family Member           Med Note Maisie Fus, Jolly Mango   Wed Apr 17, 2023  4:21 PM) Patient only takes as needed  rosuvastatin (CRESTOR) 20 MG tablet [id number] No Take 1 tablet (20 mg total) by mouth daily. Lorre Munroe, NP Taking Active Pharmacy Records, Family Member  senna (SENOKOT) 8.6 MG TABS tablet [id number] No Take 1 tablet (8.6 mg total) by mouth 2 (two) times daily as needed for mild constipation. Susanne Borders, PA Taking Active Family Member, Pharmacy Records           Med Note Maisie Fus, Jolly Mango   Wed Apr 17, 2023  4:20 PM) prn  triamcinolone ointment (KENALOG) 0.1 % [id number] No Apply 1 Application topically as needed. Pharmacy, MD Taking Active Family Member, Pharmacy Records           Med Note [place], [place]   Tue Mar 26, 2023 11:05 AM) prn            Recommendation:   Referral to: ED for possible broken hip Notify PCP of two recent falls Medicate with Tylenol as needed for pain  Follow Up Plan:   Telephone follow-up 1-3 days  Deidre Ala, BSN, RN [place]  VBCI - [facility] Health RN Care Manager [phone]

## 2023-05-16 NOTE — Patient Instructions (Signed)
 Visit Information  Thank you for taking time to visit with me today. Please don't hesitate to contact me if I can be of assistance to you before our next scheduled telephone appointment.  Our next appointment is by telephone on Monday April 14th at 10am  Following is a copy of your care plan:   Goals Addressed             This Visit's Progress    TOC 30 Outreach Program       Current Barriers: (reviewed 05/16/23) Knowledge Deficits related to plan of care for management of Anxiety, COPD, and Falls  Chronic Disease Management support and education needs related to Anxiety and COPD  Falls  RNCM Clinical Goal(s): (reviewed 05/16/23) Patient will work with the Care Management team over the next 30 days to address Transition of Care Barriers: Medication access Medication Management Diet/Nutrition/Food Resources Support at home Provider appointments Home Health services verbalize understanding of plan for management of COPD and Anxiety as evidenced by EMR, no hospital re-admissions in the next 30 days  through collaboration with RN Care manager, provider, and care team.  Fall Prevention  Interventions: (reviewed 05/16/23) Evaluation of current treatment plan related to  self management and patient's adherence to plan as established by provider   COPD Interventions:  (Status:  Goal on track:  Yes.) Short Term Goal (reviewed 05/16/23) Provided patient with basic written and verbal COPD education on self care/management/and exacerbation prevention Advised patient to track and manage COPD triggers Provided instruction about proper use of medications used for management of COPD including inhalers Advised patient to engage in light exercise as tolerated 3-5 days a week to aid in the the management of COPD Provided education about and advised patient to utilize infection prevention strategies to reduce risk of respiratory infection Discussed the importance of adequate rest and management of  fatigue with COPD Lung Cancer screening follow up - Completed 05/03/23 Oxygen 2l/min as needed for SOB  Fall Prevention:  (reviewed 05/16/23)  - Use walker at all times when ambulating  - Clear pathways, watch where oxygen tubing is  - Stand slowly and gain balance prior to ambulation  - Do not ambulate if dizzy and lightheaded  - Wear shoes that do not slide  Patient Goals/Self-Care Activities:  (reviewed 05/16/23) Participate in Transition of Care Program/Attend [facility] scheduled calls Notify RN Care Manager of [facility] call rescheduling needs Take all medications as prescribed Attend all scheduled provider appointments Call pharmacy for medication refills 3-7 days in advance of running out of medications Attend church or other social activities Perform all self care activities independently   Follow Up Plan:  Telephone follow up appointment with care management team member scheduled for:  Monday April 14th at 10am.         The patient has been provided with contact information for the care management team and has been advised to call with any health related questions or concerns.   Please call the care guide team at [phone] if you need to cancel or reschedule your appointment.   Please call the Suicide and Crisis Lifeline: 988 call the [place] National Suicide Prevention Lifeline: 956-609-5346 or TTY: (770) 750-1355 TTY 339-154-7239) to talk to a trained counselor if you are experiencing a Mental Health or Behavioral Health Crisis or need someone to talk to.  Deidre Ala, BSN, RN [place]  VBCI - [employer] Health RN Care Manager [phone]

## 2023-05-17 ENCOUNTER — Emergency Department
Admission: EM | Admit: 2023-05-17 | Discharge: 2023-05-17 | Disposition: A | Discharge status: Home / Self Care | Attending: Emergency Medicine | Admitting: Emergency Medicine

## 2023-05-17 ENCOUNTER — Other Ambulatory Visit: Payer: Self-pay

## 2023-05-17 ENCOUNTER — Emergency Department

## 2023-05-17 DIAGNOSIS — J439 Emphysema, unspecified: Secondary | ICD-10-CM | POA: Diagnosis not present

## 2023-05-17 DIAGNOSIS — E119 Type 2 diabetes mellitus without complications: Secondary | ICD-10-CM | POA: Diagnosis not present

## 2023-05-17 DIAGNOSIS — M25551 Pain in right hip: Secondary | ICD-10-CM | POA: Diagnosis not present

## 2023-05-17 DIAGNOSIS — W19XXXA Unspecified fall, initial encounter: Secondary | ICD-10-CM

## 2023-05-17 DIAGNOSIS — M25552 Pain in left hip: Secondary | ICD-10-CM | POA: Diagnosis not present

## 2023-05-17 DIAGNOSIS — W01198A Fall on same level from slipping, tripping and stumbling with subsequent striking against other object, initial encounter: Secondary | ICD-10-CM | POA: Insufficient documentation

## 2023-05-17 DIAGNOSIS — S7002XA Contusion of left hip, initial encounter: Secondary | ICD-10-CM | POA: Diagnosis not present

## 2023-05-17 DIAGNOSIS — R531 Weakness: Secondary | ICD-10-CM | POA: Diagnosis not present

## 2023-05-17 DIAGNOSIS — I10 Essential (primary) hypertension: Secondary | ICD-10-CM | POA: Diagnosis not present

## 2023-05-17 DIAGNOSIS — J449 Chronic obstructive pulmonary disease, unspecified: Secondary | ICD-10-CM | POA: Insufficient documentation

## 2023-05-17 DIAGNOSIS — S0003XA Contusion of scalp, initial encounter: Secondary | ICD-10-CM | POA: Insufficient documentation

## 2023-05-17 DIAGNOSIS — I6782 Cerebral ischemia: Secondary | ICD-10-CM | POA: Diagnosis not present

## 2023-05-17 DIAGNOSIS — S0990XA Unspecified injury of head, initial encounter: Secondary | ICD-10-CM | POA: Diagnosis present

## 2023-05-17 DIAGNOSIS — E039 Hypothyroidism, unspecified: Secondary | ICD-10-CM | POA: Diagnosis not present

## 2023-05-17 DIAGNOSIS — J320 Chronic maxillary sinusitis: Secondary | ICD-10-CM | POA: Diagnosis not present

## 2023-05-17 LAB — BASIC METABOLIC PANEL WITH GFR
Anion gap: 8 (ref 5–15)
BUN: 17 mg/dL (ref 8–23)
CO2: 32 mmol/L (ref 22–32)
Calcium: 9.3 mg/dL (ref 8.9–10.3)
Chloride: 101 mmol/L (ref 98–111)
Creatinine, Ser: 1.09 mg/dL — ABNORMAL HIGH (ref 0.44–1.00)
GFR, Estimated: 53 mL/min — ABNORMAL LOW (ref >60)
Glucose, Bld: 137 mg/dL — ABNORMAL HIGH (ref 70–99)
Potassium: 3.5 mmol/L (ref 3.5–5.1)
Sodium: 141 mmol/L (ref 135–145)

## 2023-05-17 LAB — URINALYSIS, ROUTINE W REFLEX MICROSCOPIC
Bilirubin Urine: NEGATIVE
Glucose, UA: 150 mg/dL — AB
Hgb urine dipstick: NEGATIVE
Ketones, ur: NEGATIVE mg/dL
Leukocytes,Ua: NEGATIVE
Nitrite: NEGATIVE
Protein, ur: NEGATIVE mg/dL
Specific Gravity, Urine: 1.011 (ref 1.005–1.030)
pH: 5 (ref 5.0–8.0)

## 2023-05-17 LAB — CBC WITH DIFFERENTIAL/PLATELET
Abs Immature Granulocytes: 0.04 10*3/uL (ref 0.00–0.07)
Basophils Absolute: 0.1 10*3/uL (ref 0.0–0.1)
Basophils Relative: 1 %
Eosinophils Absolute: 0.6 10*3/uL — ABNORMAL HIGH (ref 0.0–0.5)
Eosinophils Relative: 7 %
HCT: 41.2 % (ref 36.0–46.0)
Hemoglobin: 12.5 g/dL (ref 12.0–15.0)
Immature Granulocytes: 1 %
Lymphocytes Relative: 30 %
Lymphs Abs: 2.2 10*3/uL (ref 0.7–4.0)
MCH: 28.5 pg (ref 26.0–34.0)
MCHC: 30.3 g/dL (ref 30.0–36.0)
MCV: 94.1 fL (ref 80.0–100.0)
Monocytes Absolute: 0.8 10*3/uL (ref 0.1–1.0)
Monocytes Relative: 11 %
Neutro Abs: 3.7 10*3/uL (ref 1.7–7.7)
Neutrophils Relative %: 50 %
Platelets: 282 10*3/uL (ref 150–400)
RBC: 4.38 MIL/uL (ref 3.87–5.11)
RDW: 13.6 % (ref 11.5–15.5)
WBC: 7.4 10*3/uL (ref 4.0–10.5)
nRBC: 0 % (ref 0.0–0.2)

## 2023-05-17 MED ORDER — HYDROCODONE-ACETAMINOPHEN 5-325 MG PO TABS: 1.0000 | ORAL_TABLET | Freq: Three times a day (TID) | ORAL | 0 refills | Status: DC | PRN

## 2023-05-17 NOTE — Discharge Instructions (Signed)
 Call make a follow-up appointment with Ovidio Hanger who is your primary care provider. A prescription for hydrocodone was sent to the pharmacy for you to take as needed every 8 hours for pain.  Be aware that this medication could cause drowsiness and increase your risk for falling.  You should also consider using a walker to help with stability and prevent falling if you are already not using one.

## 2023-05-17 NOTE — ED Triage Notes (Signed)
 Pt via POV from home. Pt c/o multiple falls. Reports she fell twice on Wednesday. States he legs has just been getting weak. Pt c/o bilateral hip pain, upper leg pain, and bilateral hand pain. Denies any dizziness before falling. Pt is A&OX4 and NAD

## 2023-05-17 NOTE — ED Provider Notes (Signed)
 [facility] Provider Note    Event Date/Time   First MD Initiated Contact with Patient 05/17/23 1046     (approximate)   History   Fall   HPI  [name] is a 73 y.o. female   presents to the ED from home with complaint of multiple falls.  Patient states that she fell twice on Wednesday hitting the back of her head on the door and also falling to the floor which had carpet on top of concrete.  Patient denies any loss of consciousness but reports that she has had a headache since that time.  She also complains of bilateral hip pain since having surgery on her lower back.  Patient denies any visual problems, dizziness, nausea, vomiting, difficulty breathing.  Patient has a history of anxiety, depression, diabetes type 2, hypertension, COPD, hypothyroidism, spinal stenosis, lumbar disc herniation and lumbar radiculopathy, and neuromuscular disorder.      Physical Exam   Triage Vital Signs: ED Triage Vitals  Encounter Vitals Group     BP 05/17/23 0957 (!) 122/50     Systolic BP Percentile --      Diastolic BP Percentile --      Pulse Rate 05/17/23 0957 73     Resp 05/17/23 0957 18     Temp 05/17/23 0957 98.7 F (37.1 C)     Temp Source 05/17/23 0957 Oral     SpO2 05/17/23 0957 94 %     Weight 05/17/23 1005 190 lb (86.2 kg)     Height 05/17/23 1005 5\' 3"  (1.6 m)     Head Circumference --      Peak Flow --      Pain Score 05/17/23 1005 10     Pain Loc --      Pain Education --      Exclude from Growth Chart --     Most recent vital signs: Vitals:   05/17/23 0957  BP: (!) 122/50  Pulse: 73  Resp: 18  Temp: 98.7 F (37.1 C)  SpO2: 94%     General: Awake, no distress.  Alert, oriented, talkative and able to answer questions appropriately. CV:  Good peripheral perfusion.  Heart regular rate and rhythm. Resp:  Normal effort.  Lungs are clear bilaterally. Abd:  No distention.  Other:  PERRLA, EOMI's, cranial nerves II through XII grossly  intact, speech is normal, nontender ribs to palpation.  Patient is able to move upper and lower extremities.  There is tenderness on compression and palpation of the left hip.  No shortening or rotation of the left lower extremity noted.  Patient is able to lift each extremity independently off the stretcher approximately 4 inches.  Skin is intact.   ED Results / Procedures / Treatments   Labs (all labs ordered are listed, but only abnormal results are displayed) Labs Reviewed  CBC WITH DIFFERENTIAL/PLATELET - Abnormal; Notable for the following components:      Result Value   Eosinophils Absolute 0.6 (*)    All other components within normal limits  BASIC METABOLIC PANEL WITH GFR - Abnormal; Notable for the following components:   Glucose, Bld 137 (*)    Creatinine, Ser 1.09 (*)    GFR, Estimated 53 (*)    All other components within normal limits  URINALYSIS, ROUTINE W REFLEX MICROSCOPIC - Abnormal; Notable for the following components:   Color, Urine YELLOW (*)    APPearance CLEAR (*)    Glucose, UA 150 (*)  All other components within normal limits       RADIOLOGY CT head and cervical spine per radiology is negative for acute intracranial injury.  No cervical fracture or subluxation noted.  Left hip x-ray images were reviewed and interpreted by myself independent of the radiologist and was negative for fracture or dislocation.  Official radiology report also agrees negative.    PROCEDURES:  Critical Care performed:   Procedures   MEDICATIONS ORDERED IN ED: Medications - No data to display   IMPRESSION / MDM / ASSESSMENT AND PLAN / ED COURSE  I reviewed the triage vital signs and the nursing notes.   Differential diagnosis includes, but is not limited to, head injury, scalp contusion, cervical fracture, cervical spine strain, sprain, contusion, left hip fracture, dislocation, contusion, degenerative joint disease.  73 year old female presents to the ED after  multiple falls at her home with the last one being several days ago.  Patient continues to ambulate and is here with her son.  She complains of headache and left hip pain.  CT head and cervical spine were reassuring.  No fracture was noted of the left hip and patient was made aware.  Lab results was unremarkable.  We discussed use of a walker when she is up walking.  She also requested some pain medication.  She is aware that this could cause drowsiness and increase her risk for falling.  She reports that she has had hydrocodone before which has not caused any problems with sedation.  She is encouraged to follow-up with her PCP if any continued problems and return to the emergency department if any severe worsening of her symptoms.      Patient's presentation is most consistent with acute complicated illness / injury requiring diagnostic workup.  FINAL CLINICAL IMPRESSION(S) / ED DIAGNOSES   Final diagnoses:  Contusion of scalp, initial encounter  Contusion of left hip, initial encounter  Fall in home, initial encounter     Rx / DC Orders   ED Discharge Orders          Ordered    HYDROcodone-acetaminophen (NORCO/VICODIN) 5-325 MG tablet  Every 8 hours PRN        05/17/23 1338             Note:  This document was prepared using Dragon voice recognition software and may include unintentional dictation errors.   Tommi Rumps, PA-C 05/17/23 1347    Sharyn Creamer, MD 05/17/23 [phone]

## 2023-05-18 DIAGNOSIS — J9610 Chronic respiratory failure, unspecified whether with hypoxia or hypercapnia: Secondary | ICD-10-CM | POA: Diagnosis not present

## 2023-05-18 DIAGNOSIS — J449 Chronic obstructive pulmonary disease, unspecified: Secondary | ICD-10-CM | POA: Diagnosis not present

## 2023-05-20 ENCOUNTER — Telehealth: Payer: Self-pay

## 2023-05-20 ENCOUNTER — Encounter: Payer: PPO | Admitting: Physician Assistant

## 2023-05-20 ENCOUNTER — Other Ambulatory Visit: Payer: Self-pay

## 2023-05-20 DIAGNOSIS — R531 Weakness: Secondary | ICD-10-CM

## 2023-05-20 DIAGNOSIS — R296 Repeated falls: Secondary | ICD-10-CM

## 2023-05-20 NOTE — Telephone Encounter (Signed)
 Copied from CRM [id number]. Topic: General - Other >> May 20, 2023 11:14 AM Adonis Hoot wrote: Reason for CRM: Kenith Payer from  [employer] would like for Helayne Lo to send an order for home PT to [facility].She stated that hospital was suppose to send out order when patient was discharged ,however they haven't heard  anything from anyone.

## 2023-05-20 NOTE — Telephone Encounter (Signed)
Referral to home health PT placed 

## 2023-05-20 NOTE — Transitions of Care (Post Inpatient/ED Visit) (Signed)
 Transition of Care  week 5  Visit Note  05/20/2023  Name: [name] MRN: [id number]          DOB: Nov 28, 1950  Situation: Patient enrolled in [facility] 30-day program. Visit completed with Angie Fava by telephone.   Background:   Initial Transition Care Management Follow-up Telephone Call    Past Medical History:  Diagnosis Date   Anxiety    Arthritis    Chronic back pain    COPD (chronic obstructive pulmonary disease) (HCC)    Depression    GERD (gastroesophageal reflux disease)    Hypertension    Hypothyroidism    Neuromuscular disorder (HCC)    Pneumonia    RECENT X 3   PONV (postoperative nausea and vomiting)    Psoriasis    Thyroid disease     Assessment: TOC Outreach completed today. The patient did go to the ER on Friday after her son woke up due to pain from a fall to her hips and the back of her head. No fractures noted and she did not have a concussion. Her Carvedilol was discontinues due to her diastolic reading of 50. She states she fell back on the bed today and EMS came and provided her with fluids. She has been educated on sitting EOB for a minute and then when standing, use her walker and stand for at least 20 seconds before trying to walk. She has filed some paperwork for CAP which could be beneficial to have an aide with her for supervision due to falls. She is stable for a Respiratory standpoint. RNCM to check in next week and then transfer her to CCM.  Patient Reported Symptoms: Cognitive Cognitive Status: Alert and oriented to person, place, and time      Neurological  No Symptoms reported    HEENT HEENT Symptoms Reported: No symptoms reported      Cardiovascular Cardiovascular Symptoms Reported: Dizziness, Lightheadness Does patient have uncontrolled Hypertension?: No Cardiovascular Conditions: Hypertension Cardiovascular Management Strategies: Medication therapy, Adequate rest Cardiovascular Self-Management Outcome: 3 (uncertain) Cardiovascular  Comment: The patient has bee falling and feeling lightheaded. Medication held.  Respiratory Respiratory Symptoms Reported: Shortness of breath Respiratory Conditions: COPD, Shortness of breath Respiratory Management Strategies: oxygen therapy, medication therapy, breathing exercise Oxygen Therapy Device: nasal cannula Oxygen Therapy Times: continuous Oxygen Flow (L/min): 2l/min Oxygen Concentration (%): Doesn't have a pulse oximeter Respiratory Self-Management Outcome: 4 (good) Respiratory Comment: Stable  Endocrine Patient reports the following symptoms related to hypoglycemia or hyperglycemia : No symptoms reported    Gastrointestinal Gastrointestinal Symptoms Reported: No symptoms reported      Genitourinary    No symptoms reported  Integumentary Integumentary Symptoms Reported: No symptoms reported    Musculoskeletal Musculoskelatal Symptoms Reviewed: Difficulty walking, Weakness, Unsteady gait Additional Musculoskeletal Details: Falls Musculoskeletal Conditions: Back pain, Unsteady gait, Osteoporosis, Mobility limited Musculoskeletal Management Strategies: Medication therapy, Activity Musculoskeletal Self-Management Outcome: 3 (uncertain) Musculoskeletal Comment: Has been falling frequently Falls in the past year?: Yes Number of falls in past year: 2 or more Was there an injury with Fall?: Yes Fall Risk Category Calculator: 3 Patient Fall Risk Level: High Fall Risk Patient at Risk for Falls Due to: History of fall(s), Impaired balance/gait, Impaired mobility, Medication side effect Fall risk Follow up: Education provided, Falls prevention discussed  Psychosocial Psychosocial Symptoms Reported: No symptoms reported         There were no vitals filed for this visit.  Medications Reviewed Today     Reviewed by Redge Gainer, RN (  Case Manager) on 05/20/23 at 1034  Med List Status: <None>   Medication Order Taking? Sig Documenting Provider Last Dose Status Informant   acetaminophen (TYLENOL) 500 MG tablet [id number] No Take 500 mg by mouth every 6 (six) hours as needed for moderate pain.  [provider] Taking Active Pharmacy Records, Family Member           Med Note Maisie Fus, Jolly Mango   Wed Apr 17, 2023  4:20 PM) prn  albuterol (VENTOLIN HFA) 108 (90 Base) MCG/ACT inhaler [id number] No INHALE 2 PUFFS BY MOUTH EVERY 6 HOURS AS NEEDED FOR WHEEZING OR SHORTNESS OF Barbaraann Barthel, DO Taking Active Pharmacy Records, Family Member           Med Note Clarene Reamer, MARIA   Tue Mar 26, 2023 10:56 AM) prn  aspirin EC (ASPIRIN LOW DOSE) 81 MG tablet [id number] No TAKE 1 TABLET(81 MG) BY MOUTH DAILY. SWALLOW WHOLE Baity, Salvadore Oxford, NP Taking Active Pharmacy Records, Family Member  carvedilol (COREG) 3.125 MG tablet [id number] No TAKE 1 TABLET BY MOUTH TWICE DAILY WITH A MEAL  Patient not taking: Reported on 05/03/2023   Lorre Munroe, NP Not Taking Active Family Member, Pharmacy Records  citalopram (CELEXA) 20 MG tablet [id number] No Take 1 tablet (20 mg total) by mouth daily. Tresa Moore, MD Taking Active Family Member, Pharmacy Records  clonazePAM [facility]) 1 MG tablet [id number] No TAKE 1 TABLET BY MOUTH EVERY MORNING, 1/2 TABLET EVERY AFTERNOON, AND 1 TABLET EVERY NIGHT AT BEDTIME  Patient taking differently: Take 1 mg by mouth 3 (three) times daily as needed. TAKE 0.5 TABLET BY MOUTH EVERY MORNING, 1/2 TABLET EVERY AFTERNOON AND 1 TABLET EVERY NIGHT AT BEDTIME   Lorre Munroe, NP Taking Active   cyanocobalamin 1000 MCG tablet [id number] No Take 1 tablet (1,000 mcg total) by mouth daily. Gillis Santa, MD Taking Active   diltiazem (CARDIZEM LA) 120 MG 24 hr tablet [id number] No Take 1 tablet (120 mg total) by mouth daily. Lorre Munroe, NP Taking Active   fenofibrate (TRICOR) 145 MG tablet [id number] No Take 1 tablet (145 mg total) by mouth daily. Lorre Munroe, NP Taking Active Family Member, Pharmacy Records  HYDROcodone-acetaminophen  (NORCO/VICODIN) 5-325 MG tablet [id number]  Take 1 tablet by mouth every 8 (eight) hours as needed. Tommi Rumps, PA-C  Active   levothyroxine (SYNTHROID) 100 MCG tablet [id number] No Take 1 tablet (100 mcg total) by mouth daily. Lorre Munroe, NP Taking Active Pharmacy Records, Family Member  Misc. Devices (QUAD CANE/SMALL BASE) MISC [id number] No 1 Device by Does not apply route daily. Galen Manila, NP Taking Active Pharmacy Records, Family Member  OXYGEN [id number] No Inhale 2 L/min into the lungs as needed. [provider] Taking Active   pantoprazole (PROTONIX) 40 MG tablet [id number] No SMARTSIG:1 Tablet(s) By Mouth Every Evening [provider] Taking Active Pharmacy Records, Family Member           Med Note Maisie Fus, Jolly Mango   Wed Apr 17, 2023  4:21 PM) Patient only takes as needed  rosuvastatin (CRESTOR) 20 MG tablet [id number] No Take 1 tablet (20 mg total) by mouth daily. Lorre Munroe, NP Taking Active Pharmacy Records, Family Member  senna (SENOKOT) 8.6 MG TABS tablet [id number] No Take 1 tablet (8.6 mg total) by mouth 2 (two) times daily as needed for mild constipation. Susanne Borders, PA Taking Active Family Member, Pharmacy Records  Med Note Andy Bannister, CONNE[place] H   Wed Apr 17, 2023  4:20 PM) prn  triamcinolone ointment (KENALOG) 0.1 % [id number] No Apply 1 Application topically as needed. Pharmacy, MD Taking Active Family Member, Pharmacy Records           Med Note [place], MARIA   Tue Mar 26, 2023 11:05 AM) prn            Recommendation:   PCP Follow-up Continue to work with HHPT for balance and unsteady gait Continue to follow fall precautions Stop Carvedilol until directed by your provider Avoid Hydrocodone unless you have severe pain due to sedating side effects Sit EOB for a minute prior to ambulating  Clear pathways Continue with Oxygen 2l/min  Follow Up Plan:   Telephone follow-up in 1 week  Gareld June, BSN,  RN [place]  VBCI - [facility] RN Care Manager [phone]

## 2023-05-20 NOTE — Addendum Note (Signed)
 Addended by: Carollynn Cirri on: 05/20/2023 12:47 PM   Modules accepted: Orders

## 2023-05-23 ENCOUNTER — Other Ambulatory Visit: Payer: Self-pay | Admitting: Internal Medicine

## 2023-05-23 DIAGNOSIS — F410 Panic disorder [episodic paroxysmal anxiety] without agoraphobia: Secondary | ICD-10-CM

## 2023-05-24 ENCOUNTER — Other Ambulatory Visit: Payer: Self-pay | Admitting: Internal Medicine

## 2023-05-24 DIAGNOSIS — F410 Panic disorder [episodic paroxysmal anxiety] without agoraphobia: Secondary | ICD-10-CM

## 2023-05-24 NOTE — Telephone Encounter (Signed)
 Requested medications are due for refill today.  yes  Requested medications are on the active medications list.  yes  Last refill. varied  Future visit scheduled.   yes  Notes to clinic.  Celexa  was prescribed by [employer]. Klonopin  is not delegated.    Requested Prescriptions  Pending Prescriptions Disp Refills   citalopram  (CELEXA ) 20 MG tablet      Sig: Take 1 tablet (20 mg total) by mouth daily.     Psychiatry:  Antidepressants - SSRI Passed - 05/24/2023  4:34 PM      Passed - Completed PHQ-2 or PHQ-9 in the last 360 days      Passed - Valid encounter within last 6 months    Recent Outpatient Visits           3 weeks ago Acute cystitis without hematuria   [place] [facility] [place], [place] W, NP               clonazePAM  (KLONOPIN ) 1 MG tablet 75 tablet 0    Sig: TAKE 1 TABLET BY MOUTH EVERY MORNING, 1/2 TABLET EVERY AFTERNOON AND 1 TABLET EVERY NIGHT AT BEDTIME     Not Delegated - Psychiatry: Anxiolytics/Hypnotics 2 Failed - 05/24/2023  4:34 PM      Failed - This refill cannot be delegated      Failed - Urine Drug Screen completed in last 360 days      Passed - Patient is not pregnant      Passed - Valid encounter within last 6 months    Recent Outpatient Visits           3 weeks ago Acute cystitis without hematuria    [facility] [place], [place], [place]

## 2023-05-24 NOTE — Telephone Encounter (Signed)
 Requested medications are due for refill today.  yes  Requested medications are on the active medications list.  yes  Last refill. 04/25/2023 #75 0 rf  Future visit scheduled.   yes  Notes to clinic.  Refill not delegated.    Requested Prescriptions  Pending Prescriptions Disp Refills   clonazePAM  (KLONOPIN ) 1 MG tablet [Pharmacy Med Name: CLONAZEPAM  1MG  TABLETS] 75 tablet     Sig: TAKE 1 TABLET BY MOUTH EVERY MORNING, 1/2 TABLET EVERY AFTERNOON, 1 TABLET EVERY NIGHT BEFORE BEDTIME     Not Delegated - Psychiatry: Anxiolytics/Hypnotics 2 Failed - 05/24/2023  9:45 AM      Failed - This refill cannot be delegated      Failed - Urine Drug Screen completed in last 360 days      Passed - Patient is not pregnant      Passed - Valid encounter within last 6 months    Recent Outpatient Visits           3 weeks ago Acute cystitis without hematuria   [place] [facility] [place], [place] Buzz[place], [place]

## 2023-05-24 NOTE — Telephone Encounter (Signed)
 Copied from CRM [id number]. Topic: Clinical - Medication Refill >> May 24, 2023  1:59 PM Marissa P wrote: Most Recent Primary Care Visit:  Provider: Pinky Bright  Department: [facility]  Visit Type: MEDICARE AWV, SEQUENTIAL  Date: 05/03/2023  Medication: clonazePAM  (KLONOPIN ) 1 MG tablet and citalopram  (CELEXA ) 20 MG tablet  Has the patient contacted their pharmacy? Yes (Agent: If no, request that the patient contact the pharmacy for the refill. If patient does not wish to contact the pharmacy document the reason why and proceed with request.) (Agent: If yes, when and what did the pharmacy advise?)  Is this the correct pharmacy for this prescription? Yes If no, delete pharmacy and type the correct one.  This is the patient's preferred pharmacy:  [id number] [place], [place] - [address] AT [facility] [address] [place] [address] Phone: [phone] Fax: [phone]   Has the prescription been filled recently? No  Is the patient out of the medication? Yes  Has the patient been seen for an appointment in the last year OR does the patient have an upcoming appointment? Yes  Can we respond through MyChart? No  Agent: Please be advised that Rx refills may take up to 3 business days. We ask that you follow-up with your pharmacy.

## 2023-05-24 NOTE — Telephone Encounter (Signed)
 Please call patient. We cut her clonazepam  in half. She is supposed to be taking 0.5 mg  1 tab in the morning, 1/2 tab in the afternoon and 1 tab at bedtime. I am going to change this to 0.5 mg tablets instead of 1 mg tabs. This is due to her previous hospital appt that they discussed medication overuse. I just wanted to make sure she was aware before I filled this.

## 2023-05-27 ENCOUNTER — Other Ambulatory Visit: Payer: Self-pay

## 2023-05-27 VITALS — Wt 190.0 lb

## 2023-05-27 DIAGNOSIS — J441 Chronic obstructive pulmonary disease with (acute) exacerbation: Secondary | ICD-10-CM

## 2023-05-27 MED ORDER — CITALOPRAM HYDROBROMIDE 20 MG PO TABS: 20.0000 mg | ORAL_TABLET | Freq: Every day | ORAL | 0 refills | Status: DC

## 2023-05-27 NOTE — Transitions of Care (Post Inpatient/ED Visit) (Signed)
 Transition of Care  week 6  Visit Note  05/27/2023  Name: [name] MRN: [id number]          DOB: [birth year]  Situation: Patient enrolled in [facility] 30-day program. Visit completed with [name] Steagall by telephone.   Background:    Past Medical History:  Diagnosis Date   Anxiety    Arthritis    Chronic back pain    COPD (chronic obstructive pulmonary disease) (HCC)    Depression    GERD (gastroesophageal reflux disease)    Hypertension    Hypothyroidism    Neuromuscular disorder (HCC)    Pneumonia    RECENT X 3   PONV (postoperative nausea and vomiting)    Psoriasis    Thyroid  disease     Assessment: TOC completed. The patient will be transferred to the [facility] Longitudinal Team for continued monitoring. The patient's original diagnosis was post back surgery. TOC followed for COPD stability and frequent falls. The patient fell twice within the last three weeks. She reports some lightheadedness but not as bad since her Carvedilol  was stopped. She has learned to stand for a few seconds before walking and to take her rollator with the seat so she can sit if needed. She states she has some leg and back pain and takes Ibuprofen . She is a risk for re-admission and will be followed by CCM. The patient voiced understanding.   Patient Reported Symptoms: Cognitive Cognitive Status: Alert and oriented to person, place, and time      Neurological Neurological Review of Symptoms: No symptoms reported    HEENT HEENT Symptoms Reported: No symptoms reported      Cardiovascular Cardiovascular Symptoms Reported: Dizziness, Lightheadness Does patient have uncontrolled Hypertension?: No Cardiovascular Conditions: Hypertension Cardiovascular Management Strategies: Medication therapy, Activity Weight: 190 lb (86.2 kg) Cardiovascular Self-Management Outcome: 3 (uncertain) Cardiovascular Comment: Still complains of lightheadedness  Respiratory Respiratory Symptoms Reported: Shortness of breath,  Productive cough Additional Respiratory Details: Cough on occassion Respiratory Conditions: COPD, Shortness of breath Respiratory Management Strategies: oxygen  therapy, activity, medication therapy Oxygen  Therapy Device: nasal cannula Oxygen  Therapy Times: continuous Oxygen  Flow (L/min): 2/l/min Respiratory Self-Management Outcome: 4 (good) Respiratory Comment: Stable  Endocrine Patient reports the following symptoms related to hypoglycemia or hyperglycemia : No symptoms reported    Gastrointestinal Gastrointestinal Symptoms Reported: No symptoms reported   Nutrition Risk Screen (CP): No indicators present  Genitourinary Genitourinary Symptoms Reported: No symptoms reported    Integumentary Integumentary Symptoms Reported: No symptoms reported    Musculoskeletal Musculoskelatal Symptoms Reviewed: Difficulty walking, Unsteady gait, Weakness Additional Musculoskeletal Details: Frequent falls Musculoskeletal Conditions: Back pain, Frailty syndrome, Mobility limited, Unsteady gait Musculoskeletal Management Strategies: Exercise, Activity, Medication therapy Musculoskeletal Self-Management Outcome: 3 (uncertain) Musculoskeletal Comment: Working with HHPT Falls in the past year?: Yes Number of falls in past year: 1 or less Was there an injury with Fall?: Yes Fall Risk Category Calculator: 2 Patient Fall Risk Level: Moderate Fall Risk Patient at Risk for Falls Due to: History of fall(s), Impaired balance/gait, Impaired mobility, Medication side effect Fall risk Follow up: Education provided  Psychosocial Psychosocial Symptoms Reported: No symptoms reported         There were no vitals filed for this visit.  Medications Reviewed Today     Reviewed by Claudene Crystal, RN (Case Manager) on 05/27/23 at 1006  Med List Status: <None>   Medication Order Taking? Sig Documenting Provider Last Dose Status Informant  acetaminophen  (TYLENOL ) 500 MG tablet [id number]  Take 500 mg by mouth  every 6 (six) hours as needed for moderate pain.  [provider]  Active Pharmacy Records, Family Member           Med Note Andy Bannister, Caryle Class   Wed Apr 17, 2023  4:20 PM) prn  albuterol  (VENTOLIN  HFA) 108 [phone] Base) MCG/ACT inhaler [id number]  INHALE 2 PUFFS BY MOUTH EVERY 6 HOURS AS NEEDED FOR WHEEZING OR SHORTNESS OF Valma Gazella, DO  Active Pharmacy Records, Family Member           Med Note Lexine Redder, MARIA   Tue Mar 26, 2023 10:56 AM) prn  aspirin  EC (ASPIRIN  LOW DOSE) 81 MG tablet [id number]  TAKE 1 TABLET(81 MG) BY MOUTH DAILY. SWALLOW WHOLE Baity, Rankin Buzzard, NP  Active Pharmacy Records, Family Member  carvedilol  (COREG ) 3.125 MG tablet [id number]  TAKE 1 TABLET BY MOUTH TWICE DAILY WITH A MEAL  Patient not taking: Reported on 05/03/2023   Carollynn Cirri, NP  Active Family Member, Pharmacy Records  citalopram  (CELEXA ) 20 MG tablet [id number]  Take 1 tablet (20 mg total) by mouth daily. Carollynn Cirri, NP  Active   clonazePAM  (KLONOPIN ) 0.5 MG tablet [id number]  1 tab PO QAM, 1 tab PO every afternoon and 1 tab PO QPM Carollynn Cirri, NP  Active   cyanocobalamin  1000 MCG tablet [id number]  Take 1 tablet (1,000 mcg total) by mouth daily. Althia Atlas, MD  Active   diltiazem  (CARDIZEM  LA) 120 MG 24 hr tablet [id number]  Take 1 tablet (120 mg total) by mouth daily. Carollynn Cirri, NP  Active   fenofibrate  (TRICOR ) 145 MG tablet [id number]  Take 1 tablet (145 mg total) by mouth daily. Carollynn Cirri, NP  Active Family Member, Pharmacy Records  HYDROcodone -acetaminophen  (NORCO/VICODIN) 5-325 MG tablet [id number] No Take 1 tablet by mouth every 8 (eight) hours as needed.  Patient not taking: Reported on 05/27/2023   Stafford Eagles, PA-C Not Taking Active   levothyroxine  (SYNTHROID ) 100 MCG tablet [id number]  Take 1 tablet (100 mcg total) by mouth daily. Carollynn Cirri, NP  Active Pharmacy Records, Family Member  Misc. Devices (QUAD CANE/SMALL BASE) MISC [id number]  1 Device by  Does not apply route daily. Brenda Calkin, NP  Active Pharmacy Records, Family Member  OXYGEN  [id number]  Inhale 2 L/min into the lungs as needed. [provider]  Active   pantoprazole  (PROTONIX ) 40 MG tablet [id number]  SMARTSIG:1 Tablet(s) By Mouth Every Evening [provider]  Active Pharmacy Records, Family Member           Med Note Andy Bannister, Caryle Class   Wed Apr 17, 2023  4:21 PM) Patient only takes as needed  rosuvastatin  (CRESTOR ) 20 MG tablet [id number]  Take 1 tablet (20 mg total) by mouth daily. Carollynn Cirri, NP  Active Pharmacy Records, Family Member  senna (SENOKOT) 8.6 MG TABS tablet [id number]  Take 1 tablet (8.6 mg total) by mouth 2 (two) times daily as needed for mild constipation. Noble Bateman, PA  Active Family Member, Pharmacy Records           Med Note Andy Bannister, Caryle Class   Wed Apr 17, 2023  4:20 PM) prn  triamcinolone  ointment (KENALOG ) 0.1 % [id number]  Apply 1 Application topically as needed. Pharmacy, MD  Active Family Member, Pharmacy Records           Med Note [place], [place]   Tue Mar 26, 2023 11:05 AM) prn  Recommendation:   Referral to: CCM Longitudinal Program Follow up with Ludwig Safer, Neurosurgeon for post op laminectomy  Follow Up Plan:   Closing From:  Transitions of Care Program  [facility], BSN, RN [place]  VBCI - [facility] Health RN Care Manager [phone]

## 2023-05-27 NOTE — Patient Instructions (Signed)
 Visit Information  Thank you for taking time to visit with me today. Please don't hesitate to contact me if I can be of assistance to you before our next scheduled telephone appointment.  Following is a copy of your care plan:   Goals Addressed             This Visit's Progress    COMPLETED: TOC 30 Outreach Program   On track    Current Barriers: (reviewed 05/20/23) Knowledge Deficits related to plan of care for management of Anxiety, COPD, and Falls  Chronic Disease Management support and education needs related to Anxiety and COPD  Falls  RNCM Clinical Goal(s): (reviewed 05/20/23) Patient will work with the Care Management team over the next 30 days to address Transition of Care Barriers: Medication access Medication Management Diet/Nutrition/Food Resources Support at home Provider appointments Home Health services verbalize understanding of plan for management of COPD and Anxiety as evidenced by EMR, no hospital re-admissions in the next 30 days  through collaboration with RN Care manager, provider, and care team.  Fall Prevention  Interventions: (reviewed 05/20/23) Evaluation of current treatment plan related to  self management and patient's adherence to plan as established by provider   COPD Interventions:  (Status:  Goal on track:  Yes.) Short Term Goal (reviewed 05/18/23) Provided patient with basic written and verbal COPD education on self care/management/and exacerbation prevention Advised patient to track and manage COPD triggers Provided instruction about proper use of medications used for management of COPD including inhalers Advised patient to engage in light exercise as tolerated 3-5 days a week to aid in the the management of COPD Provided education about and advised patient to utilize infection prevention strategies to reduce risk of respiratory infection Discussed the importance of adequate rest and management of fatigue with COPD Lung Cancer screening follow up -  Completed 05/03/23 Oxygen  2l/min as needed for SOB  Fall Prevention:  (reviewed 05/18/23)  - Use walker at all times when ambulating  - Clear pathways, watch where oxygen  tubing is  - Stand slowly and gain balance prior to ambulation  - Do not ambulate if dizzy and lightheaded  - Wear shoes that do not slide  - Stop Carvedilol  until instructed to resume  Patient Goals/Self-Care Activities:  (reviewed 05/18/23) Participate in Transition of Care Program/Attend [facility] scheduled calls Notify RN Care Manager of [facility] call rescheduling needs Take all medications as prescribed Attend all scheduled provider appointments Call pharmacy for medication refills 3-7 days in advance of running out of medications Attend church or other social activities Perform all self care activities independently   Follow Up Plan:  Telephone follow up appointment with care management team member scheduled for:  Monday April 21st at 10am.         The patient verbalized understanding of instructions, educational materials, and care plan provided today and agreed to receive a mailed copy of patient instructions, educational materials, and care plan.   The patient has been provided with contact information for the care management team and has been advised to call with any health related questions or concerns.   Please call the care guide team at [phone] if you need to cancel or reschedule your appointment.   Please call the Suicide and Crisis Lifeline: 988 call the [place]  National Suicide Prevention Lifeline: 364-405-4255 or TTY: 5610271793 TTY 548 887 3632) to talk to a trained counselor if you are experiencing a Mental Health or Behavioral Health Crisis or need someone to talk to.  [employer],  BSN, RN [place]  VBCI - Population Health RN Care Manager [phone]

## 2023-05-28 ENCOUNTER — Telehealth: Payer: Self-pay

## 2023-05-28 DIAGNOSIS — I110 Hypertensive heart disease with heart failure: Secondary | ICD-10-CM | POA: Diagnosis not present

## 2023-05-28 DIAGNOSIS — Z9181 History of falling: Secondary | ICD-10-CM | POA: Diagnosis not present

## 2023-05-28 DIAGNOSIS — G9341 Metabolic encephalopathy: Secondary | ICD-10-CM | POA: Diagnosis not present

## 2023-05-28 DIAGNOSIS — Z8701 Personal history of pneumonia (recurrent): Secondary | ICD-10-CM | POA: Diagnosis not present

## 2023-05-28 DIAGNOSIS — B974 Respiratory syncytial virus as the cause of diseases classified elsewhere: Secondary | ICD-10-CM | POA: Diagnosis not present

## 2023-05-28 DIAGNOSIS — Z7982 Long term (current) use of aspirin: Secondary | ICD-10-CM | POA: Diagnosis not present

## 2023-05-28 DIAGNOSIS — K219 Gastro-esophageal reflux disease without esophagitis: Secondary | ICD-10-CM | POA: Diagnosis not present

## 2023-05-28 DIAGNOSIS — F419 Anxiety disorder, unspecified: Secondary | ICD-10-CM | POA: Diagnosis not present

## 2023-05-28 DIAGNOSIS — Z87891 Personal history of nicotine dependence: Secondary | ICD-10-CM | POA: Diagnosis not present

## 2023-05-28 DIAGNOSIS — G8929 Other chronic pain: Secondary | ICD-10-CM | POA: Diagnosis not present

## 2023-05-28 DIAGNOSIS — Z602 Problems related to living alone: Secondary | ICD-10-CM | POA: Diagnosis not present

## 2023-05-28 DIAGNOSIS — I509 Heart failure, unspecified: Secondary | ICD-10-CM | POA: Diagnosis not present

## 2023-05-28 DIAGNOSIS — L409 Psoriasis, unspecified: Secondary | ICD-10-CM | POA: Diagnosis not present

## 2023-05-28 DIAGNOSIS — M549 Dorsalgia, unspecified: Secondary | ICD-10-CM | POA: Diagnosis not present

## 2023-05-28 DIAGNOSIS — M1990 Unspecified osteoarthritis, unspecified site: Secondary | ICD-10-CM | POA: Diagnosis not present

## 2023-05-28 DIAGNOSIS — N390 Urinary tract infection, site not specified: Secondary | ICD-10-CM | POA: Diagnosis not present

## 2023-05-28 DIAGNOSIS — J111 Influenza due to unidentified influenza virus with other respiratory manifestations: Secondary | ICD-10-CM | POA: Diagnosis not present

## 2023-05-28 DIAGNOSIS — E039 Hypothyroidism, unspecified: Secondary | ICD-10-CM | POA: Diagnosis not present

## 2023-05-28 DIAGNOSIS — J441 Chronic obstructive pulmonary disease with (acute) exacerbation: Secondary | ICD-10-CM | POA: Diagnosis not present

## 2023-05-28 NOTE — Telephone Encounter (Signed)
 Advised okay for verbal orders.

## 2023-05-28 NOTE — Telephone Encounter (Signed)
 Copied from CRM [id number]. Topic: Clinical - Home Health Verbal Orders >> May 28, 2023  3:12 PM Star [place] wrote: Caller/Agency: [facility] Callback Number: [phone] Service Requested: Physical Therapy Frequency: 1x a week for 7 weeks and Occupational Therapy eval Any new concerns about the patient? No

## 2023-05-28 NOTE — Telephone Encounter (Signed)
 Ok for verbal orders as requested.

## 2023-05-29 ENCOUNTER — Telehealth: Payer: Self-pay

## 2023-05-29 NOTE — Progress Notes (Signed)
 Complex Care Management Note Care Guide Note  05/29/2023 Name: [name] MRN: [id number] DOB: 12/13/1950   Complex Care Management Outreach Attempts: An unsuccessful telephone outreach was attempted today to offer the patient information about available complex care management services.  Follow Up Plan:  Additional outreach attempts will be made to offer the patient complex care management information and services.   Encounter Outcome:  No Answer  Lenton Rail , RMA       [facility], [facility] Guide  Direct Dial: [phone]  Website: [email or web address]

## 2023-05-30 ENCOUNTER — Telehealth: Payer: Self-pay

## 2023-05-30 NOTE — Telephone Encounter (Signed)
 Copied from CRM [id number]. Topic: Clinical - Home Health Verbal Orders >> May 30, 2023  3:32 PM Leory Rands wrote: Trevor Fudge OT with [facility] is calling as the patient is requesting evaluation next week. Cb- [phone]

## 2023-05-31 NOTE — Telephone Encounter (Signed)
 Lm with Trevor Fudge advising okay for verbals. Call back if anything else is needed from us .

## 2023-05-31 NOTE — Telephone Encounter (Signed)
 Okay.  It is all they need a verbal order for me to approve this?

## 2023-06-03 ENCOUNTER — Encounter: Admitting: Physician Assistant

## 2023-06-04 NOTE — Progress Notes (Signed)
 Complex Care Management Note  Care Guide Note 06/04/2023 Name: LORECE GARGUS MRN: [id number] DOB: 1950/12/27  JULINA BARSE is a 73 y.o. year old female who sees Baity, Rankin Buzzard, NP for primary care. I reached out to Irene Mannheim by phone today to offer complex care management services.  Ms. Penton was given information about Complex Care Management services today including:   The Complex Care Management services include support from the care team which includes your Nurse Care Manager, Clinical Social Worker, or Pharmacist.  The Complex Care Management team is here to help remove barriers to the health concerns and goals most important to you. Complex Care Management services are voluntary, and the patient may decline or stop services at any time by request to their care team member.   Complex Care Management Consent Status: Patient agreed to services and verbal consent obtained.   Follow up plan:  Telephone appointment with complex care management team member scheduled for:  06/11/2023  Encounter Outcome:  Patient Scheduled  Lenton Rail , RMA     [place] [facility] [place]  [facility], [facility] Guide  Direct Dial: [phone]  Website: [email or web address]

## 2023-06-05 ENCOUNTER — Telehealth: Payer: Self-pay

## 2023-06-05 ENCOUNTER — Ambulatory Visit: Admitting: Internal Medicine

## 2023-06-05 NOTE — Telephone Encounter (Signed)
 Copied from CRM [id number]. Topic: Clinical - Home Health Verbal Orders >> Jun 05, 2023 12:10 PM Everette C wrote: Caller/Agency: [facility] Callback Number: [phone] Service Requested: Occupational Therapy Frequency: 1w4 Any new concerns about the patient? Yes

## 2023-06-06 NOTE — Telephone Encounter (Signed)
Okay for verbal orders as requested? 

## 2023-06-06 NOTE — Telephone Encounter (Signed)
 Notified Stephanie with verbal orders.

## 2023-06-07 ENCOUNTER — Ambulatory Visit: Admitting: Internal Medicine

## 2023-06-07 ENCOUNTER — Encounter: Payer: Self-pay | Admitting: Internal Medicine

## 2023-06-07 ENCOUNTER — Other Ambulatory Visit: Payer: Self-pay

## 2023-06-07 VITALS — BP 118/62 | Ht 63.0 in | Wt 192.0 lb

## 2023-06-07 DIAGNOSIS — E039 Hypothyroidism, unspecified: Secondary | ICD-10-CM

## 2023-06-07 DIAGNOSIS — E119 Type 2 diabetes mellitus without complications: Secondary | ICD-10-CM | POA: Diagnosis not present

## 2023-06-07 DIAGNOSIS — F99 Mental disorder, not otherwise specified: Secondary | ICD-10-CM

## 2023-06-07 DIAGNOSIS — K219 Gastro-esophageal reflux disease without esophagitis: Secondary | ICD-10-CM

## 2023-06-07 DIAGNOSIS — M150 Primary generalized (osteo)arthritis: Secondary | ICD-10-CM

## 2023-06-07 DIAGNOSIS — Z122 Encounter for screening for malignant neoplasm of respiratory organs: Secondary | ICD-10-CM

## 2023-06-07 DIAGNOSIS — I700 Atherosclerosis of aorta: Secondary | ICD-10-CM | POA: Diagnosis not present

## 2023-06-07 DIAGNOSIS — I70219 Atherosclerosis of native arteries of extremities with intermittent claudication, unspecified extremity: Secondary | ICD-10-CM

## 2023-06-07 DIAGNOSIS — E785 Hyperlipidemia, unspecified: Secondary | ICD-10-CM

## 2023-06-07 DIAGNOSIS — J432 Centrilobular emphysema: Secondary | ICD-10-CM

## 2023-06-07 DIAGNOSIS — L409 Psoriasis, unspecified: Secondary | ICD-10-CM

## 2023-06-07 DIAGNOSIS — U071 COVID-19: Secondary | ICD-10-CM | POA: Diagnosis not present

## 2023-06-07 DIAGNOSIS — I10 Essential (primary) hypertension: Secondary | ICD-10-CM

## 2023-06-07 DIAGNOSIS — F5105 Insomnia due to other mental disorder: Secondary | ICD-10-CM | POA: Diagnosis not present

## 2023-06-07 DIAGNOSIS — Z87891 Personal history of nicotine dependence: Secondary | ICD-10-CM

## 2023-06-07 DIAGNOSIS — E1169 Type 2 diabetes mellitus with other specified complication: Secondary | ICD-10-CM | POA: Diagnosis not present

## 2023-06-07 DIAGNOSIS — I6523 Occlusion and stenosis of bilateral carotid arteries: Secondary | ICD-10-CM

## 2023-06-07 DIAGNOSIS — F419 Anxiety disorder, unspecified: Secondary | ICD-10-CM

## 2023-06-07 DIAGNOSIS — M5416 Radiculopathy, lumbar region: Secondary | ICD-10-CM

## 2023-06-07 DIAGNOSIS — E66811 Obesity, class 1: Secondary | ICD-10-CM

## 2023-06-07 DIAGNOSIS — F32A Depression, unspecified: Secondary | ICD-10-CM

## 2023-06-07 DIAGNOSIS — F410 Panic disorder [episodic paroxysmal anxiety] without agoraphobia: Secondary | ICD-10-CM

## 2023-06-07 LAB — POC COVID19/FLU A&B COMBO
Covid Antigen, POC: POSITIVE — AB
Influenza A Antigen, POC: NEGATIVE
Influenza B Antigen, POC: NEGATIVE

## 2023-06-07 MED ORDER — FLUTICASONE FUROATE-VILANTEROL 200-25 MCG/ACT IN AEPB: 1.0000 | INHALATION_SPRAY | Freq: Every day | RESPIRATORY_TRACT | 11 refills | Status: AC

## 2023-06-07 MED ORDER — BENZONATATE 100 MG PO CAPS: 100.0000 mg | ORAL_CAPSULE | Freq: Three times a day (TID) | ORAL | 0 refills | Status: DC | PRN

## 2023-06-07 NOTE — Assessment & Plan Note (Signed)
 A1c and urine microalbumin today Encourage low-carb diet and exercise for weight loss Not medicated Encouraged routine eye exam Encouraged routine foot exam She declines flu shot Pneumonia vaccines UTD She declines COVID

## 2023-06-07 NOTE — Assessment & Plan Note (Signed)
 Encourage diet and exercise for weight loss

## 2023-06-07 NOTE — Patient Instructions (Signed)
 Fall Prevention in the Home, Adult Falls can cause injuries and can happen to people of all ages. There are many things you can do to make your home safer and to help prevent falls. What actions can I take to prevent falls? General information Use good lighting in all rooms. Make sure to: Replace any light bulbs that burn out. Turn on the lights in dark areas and use night-lights. Keep items that you use often in easy-to-reach places. Lower the shelves around your home if needed. Move furniture so that there are clear paths around it. Do not use throw rugs or other things on the floor that can make you trip. If any of your floors are uneven, fix them. Add color or contrast paint or tape to clearly mark and help you see: Grab bars or handrails. First and last steps of staircases. Where the edge of each step is. If you use a ladder or stepladder: Make sure that it is fully opened. Do not climb a closed ladder. Make sure the sides of the ladder are locked in place. Have someone hold the ladder while you use it. Know where your pets are as you move through your home. What can I do in the bathroom?     Keep the floor dry. Clean up any water on the floor right away. Remove soap buildup in the bathtub or shower. Buildup makes bathtubs and showers slippery. Use non-skid mats or decals on the floor of the bathtub or shower. Attach bath mats securely with double-sided, non-slip rug tape. If you need to sit down in the shower, use a non-slip stool. Install grab bars by the toilet and in the bathtub and shower. Do not use towel bars as grab bars. What can I do in the bedroom? Make sure that you have a light by your bed that is easy to reach. Do not use any sheets or blankets on your bed that hang to the floor. Have a firm chair or bench with side arms that you can use for support when you get dressed. What can I do in the kitchen? Clean up any spills right away. If you need to reach something  above you, use a step stool with a grab bar. Keep electrical cords out of the way. Do not use floor polish or wax that makes floors slippery. What can I do with my stairs? Do not leave anything on the stairs. Make sure that you have a light switch at the top and the bottom of the stairs. Make sure that there are handrails on both sides of the stairs. Fix handrails that are broken or loose. Install non-slip stair treads on all your stairs if they do not have carpet. Avoid having throw rugs at the top or bottom of the stairs. Choose a carpet that does not hide the edge of the steps on the stairs. Make sure that the carpet is firmly attached to the stairs. Fix carpet that is loose or worn. What can I do on the outside of my home? Use bright outdoor lighting. Fix the edges of walkways and driveways and fix any cracks. Clear paths of anything that can make you trip, such as tools or rocks. Add color or contrast paint or tape to clearly mark and help you see anything that might make you trip as you walk through a door, such as a raised step or threshold. Trim any bushes or trees on paths to your home. Check to see if handrails are loose  or broken and that both sides of all steps have handrails. Install guardrails along the edges of any raised decks and porches. Have leaves, snow, or ice cleared regularly. Use sand, salt, or ice melter on paths if you live where there is ice and snow during the winter. Clean up any spills in your garage right away. This includes grease or oil spills. What other actions can I take? Review your medicines with your doctor. Some medicines can cause dizziness or changes in blood pressure, which increase your risk of falling. Wear shoes that: Have a low heel. Do not wear high heels. Have rubber bottoms and are closed at the toe. Feel good on your feet and fit well. Use tools that help you move around if needed. These include: Canes. Walkers. Scooters. Crutches. Ask  your doctor what else you can do to help prevent falls. This may include seeing a physical therapist to learn to do exercises to move better and get stronger. Where to find more information Centers for Disease Control and Prevention, STEADI: TonerPromos.no [employer] on Aging: BaseRingTones.pl National Institute on Aging: BaseRingTones.pl Contact a doctor if: You are afraid of falling at home. You feel weak, drowsy, or dizzy at home. You fall at home. Get help right away if you: Lose consciousness or have trouble moving after a fall. Have a fall that causes a head injury. These symptoms may be an emergency. Get help right away. Call 911. Do not wait to see if the symptoms will go away. Do not drive yourself to the hospital. This information is not intended to replace advice given to you by your health care provider. Make sure you discuss any questions you have with your health care provider. Document Revised: 09/25/2021 Document Reviewed: 09/25/2021 Elsevier Patient Education  2024 [employer].

## 2023-06-07 NOTE — Assessment & Plan Note (Signed)
Encourage weight loss as this can help reduce joint pain Continue Tylenol OTC as needed

## 2023-06-07 NOTE — Assessment & Plan Note (Signed)
 Continue clobetasol  She is about to be started on an injectable medication but does not know the name of it She will continue to follow with dermatology

## 2023-06-07 NOTE — Assessment & Plan Note (Signed)
 Continue citalopram  and clonazepam  Support offered

## 2023-06-07 NOTE — Assessment & Plan Note (Signed)
 Status post surgical intervention Tylenol  as needed

## 2023-06-07 NOTE — Progress Notes (Signed)
 Subjective:    Patient ID: [name], female    DOB: Jun 14, 1950, 73 y.o.   MRN: [id number]  HPI  Patient presents to clinic today for follow-up of chronic conditions.  HTN: Her BP today is 118/62.  She is taking diltiazem  and lisinopril  as prescribed.  ECG from 05/2018 reviewed.  HLD with carotid/thoracic aherosclerosis, PAD with claudication: Her last LDL was 48, triglycerides 914, 08/2022.  She denies myalgias on rosuvastatin  and fenofibrate .  She is not taking aspirin  as prescribed due to frequent falls.  She does not consume low-fat diet.  She follows with vascular.  COPD: She reports chronic cough and shortness of breath.  She is using breo and albuterol  as prescribed.  There are no PFTs on file.  She no longer smokes. She has oxygen  at home but rarely uses this. She does not follow with pulmonology.  OA: Mainly in her back,hips and knees.  She takes tylenol  OTC with some relief of symptoms.  She does not follow with orthopedics.  Depression, anxiety and panic attacks: Chronic, managed on citalopram  and clonazepam .  She is not currently seeing a therapist or psychiatrist.  She denies SI/HI.  GERD: Triggered by spicy foods.  She denies breakthrough on pantoprazole ..  There is no upper GI on file.  Insomnia: She has difficulty staying asleep.  She takes clonazepam  at bedtime with good relief of symptoms.  There is no sleep study on file.  DM2: Her last A1c was 6.4%, 02/2023.  She is not taking any oral diabetic medication at this time. She has not seen an eye doctor in the last year. She does not check her sugars. Flu: 12/2022. Pneumovax 04/2019. Prevnar 20 02/2023. Covid never.  Hypothyroidism: She denies any issues on her current dose of levothyroxine .  She does not follow with endocrinology.  Psoriasis: Managed with clobetasol . She is being started on an injectable medication but she does not know the name of it.  She follows with dermatology.  Review of Systems     Past Medical  History:  Diagnosis Date   Anxiety    Arthritis    Chronic back pain    COPD (chronic obstructive pulmonary disease) (HCC)    Depression    GERD (gastroesophageal reflux disease)    Hypertension    Hypothyroidism    Neuromuscular disorder (HCC)    Pneumonia    RECENT X 3   PONV (postoperative nausea and vomiting)    Psoriasis    Thyroid  disease     Current Outpatient Medications  Medication Sig Dispense Refill   acetaminophen  (TYLENOL ) 500 MG tablet Take 500 mg by mouth every 6 (six) hours as needed for moderate pain.      albuterol  (VENTOLIN  HFA) 108 (90 Base) MCG/ACT inhaler INHALE 2 PUFFS BY MOUTH EVERY 6 HOURS AS NEEDED FOR WHEEZING OR SHORTNESS OF BREATH 8.5 g 2   aspirin  EC (ASPIRIN  LOW DOSE) 81 MG tablet TAKE 1 TABLET(81 MG) BY MOUTH DAILY. SWALLOW WHOLE 90 tablet 1   carvedilol  (COREG ) 3.125 MG tablet TAKE 1 TABLET BY MOUTH TWICE DAILY WITH A MEAL (Patient not taking: Reported on 05/03/2023) 180 tablet 0   citalopram  (CELEXA ) 20 MG tablet Take 1 tablet (20 mg total) by mouth daily. 90 tablet 0   clonazePAM  (KLONOPIN ) 0.5 MG tablet 1 tab PO QAM, 1 tab PO every afternoon and 1 tab PO QPM 90 tablet 0   cyanocobalamin  1000 MCG tablet Take 1 tablet (1,000 mcg total) by mouth daily. 90 tablet  0   diltiazem  (CARDIZEM  LA) 120 MG 24 hr tablet Take 1 tablet (120 mg total) by mouth daily.     fenofibrate  (TRICOR ) 145 MG tablet Take 1 tablet (145 mg total) by mouth daily. 90 tablet 0   HYDROcodone -acetaminophen  (NORCO/VICODIN) 5-325 MG tablet Take 1 tablet by mouth every 8 (eight) hours as needed. (Patient not taking: Reported on 05/27/2023) 15 tablet 0   levothyroxine  (SYNTHROID ) 100 MCG tablet Take 1 tablet (100 mcg total) by mouth daily. 90 tablet 1   Misc. Devices (QUAD CANE/SMALL BASE) MISC 1 Device by Does not apply route daily. 1 each 0   OXYGEN  Inhale 2 L/min into the lungs as needed.     pantoprazole  (PROTONIX ) 40 MG tablet SMARTSIG:1 Tablet(s) By Mouth Every Evening      rosuvastatin  (CRESTOR ) 20 MG tablet Take 1 tablet (20 mg total) by mouth daily. 90 tablet 3   senna (SENOKOT) 8.6 MG TABS tablet Take 1 tablet (8.6 mg total) by mouth 2 (two) times daily as needed for mild constipation. 100 tablet 0   triamcinolone  ointment (KENALOG ) 0.1 % Apply 1 Application topically as needed.     No current facility-administered medications for this visit.    Allergies  Allergen Reactions   Ciprofloxacin Hives and Diarrhea   Gabapentin  Other (See Comments)    Hallucination   Trazodone  And Nefazodone Other (See Comments)    Altered Mental Status    Trazodone  Other (See Comments)   Mucinex  [Guaifenesin  Er] Nausea Only   Oxycodone  Diarrhea   Sulfa Antibiotics Rash    Throat, mouth Throat, mouth    Family History  Problem Relation Age of Onset   Heart disease Mother    Breast cancer Mother    Heart attack Mother    COPD Father    Stroke Paternal Uncle    Kidney disease Sister    Breast cancer Cousin    Breast cancer Cousin    Heart attack Maternal Aunt    Stroke Paternal Grandfather    Kidney cancer Neg Hx    Bladder Cancer Neg Hx     Social History   Socioeconomic History   Marital status: Widowed    Spouse name: Not on file   Number of children: Not on file   Years of education: Not on file   Highest education level: Not on file  Occupational History   Occupation: retired  Tobacco Use   Smoking status: Former    Current packs/day: 0.00    Average packs/day: 1 pack/day for 35.0 years (35.0 ttl pk-yrs)    Types: Cigarettes    Start date: 04/19/2018    Quit date: 11/16/2020    Years since quitting: 2.5   Smokeless tobacco: Never   Tobacco comments:    Previously smoked 2 ppd.   Vaping Use   Vaping status: Never Used  Substance and Sexual Activity   Alcohol  use: No   Drug use: No   Sexual activity: Not Currently    Birth control/protection: Surgical  Other Topics Concern   Not on file  Social History Narrative   Not on file    Social Drivers of Health   Financial Resource Strain: Low Risk  (05/03/2023)   Overall Financial Resource Strain (CARDIA)    Difficulty of Paying Living Expenses: Not hard at all  Food Insecurity: No Food Insecurity (05/16/2023)   Hunger Vital Sign    Worried About Running Out of Food in the Last Year: Never true    Ran Out  of Food in the Last Year: Never true  Transportation Needs: No Transportation Needs (05/16/2023)   PRAPARE - Administrator, Civil Service (Medical): No    Lack of Transportation (Non-Medical): No  Physical Activity: Insufficiently Active (05/03/2023)   Exercise Vital Sign    Days of Exercise per Week: 2 days    Minutes of Exercise per Session: 20 min  Stress: No Stress Concern Present (05/03/2023)   [employer] of Occupational Health - Occupational Stress Questionnaire    Feeling of Stress : Only a little  Social Connections: Moderately Integrated (05/03/2023)   Social Connection and Isolation Panel [NHANES]    Frequency of Communication with Friends and Family: More than three times a week    Frequency of Social Gatherings with Friends and Family: Three times a week    Attends Religious Services: More than 4 times per year    Active Member of Clubs or Organizations: Yes    Attends Banker Meetings: More than 4 times per year    Marital Status: Widowed  Recent Concern: Social Connections - Moderately Isolated (03/26/2023)   Social Connection and Isolation Panel [NHANES]    Frequency of Communication with Friends and Family: More than three times a week    Frequency of Social Gatherings with Friends and Family: Three times a week    Attends Religious Services: More than 4 times per year    Active Member of Clubs or Organizations: No    Attends Banker Meetings: Never    Marital Status: Widowed  Intimate Partner Violence: Not At Risk (05/16/2023)   Humiliation, Afraid, Rape, and Kick questionnaire    Fear of Current or  Ex-Partner: No    Emotionally Abused: No    Physically Abused: No    Sexually Abused: No     Constitutional: Denies fever, malaise, fatigue, headache or abrupt weight changes.  HEENT: Denies eye pain, eye redness, ear pain, ringing in the ears, wax buildup, runny nose, nasal congestion, bloody nose, or sore throat. Respiratory: Patient reports chronic cough and shortness of breath.  Denies difficulty breathing, or sputum production.   Cardiovascular: Denies chest pain, chest tightness, palpitations or swelling in the hands or feet.  Gastrointestinal: Denies abdominal pain, bloating, constipation, diarrhea or blood in the stool.  GU: Denies urgency, frequency, pain with urination, burning sensation, blood in urine, odor or discharge. Musculoskeletal: Patient reports joint pain, frequent falls.  Denies decrease in range of motion, difficulty with gait, muscle pain or joint swelling.  Skin: Denies redness, rashes, lesions or ulcercations.  Neurological: Patient reports insomnia, paresthesia of left lower extremity.  Denies dizziness, difficulty with memory, difficulty with speech or problems with balance and coordination.  Psych: Patient has a history of anxiety and depression.  Denies SI/HI.  No other specific complaints in a complete review of systems (except as listed in HPI above).  Objective:   Physical Exam BP 118/62 (BP Location: Right Arm, Patient Position: Sitting, Cuff Size: Normal)   Ht 5\' 3"  (1.6 m)   Wt 192 lb (87.1 kg)   BMI 34.01 kg/m    Wt Readings from Last 3 Encounters:  05/27/23 190 lb (86.2 kg)  05/17/23 190 lb (86.2 kg)  05/16/23 192 lb (87.1 kg)    General: Appears her stated age, obese, in NAD. Skin: Warm, dry and intact. No ulcerations noted. HEENT: Head: normal shape and size; Eyes: sclera white, no icterus, conjunctiva pink, PERRLA and EOMs intact;  Neck:  Neck supple,  trachea midline. No masses, lumps or thyromegaly present.  Cardiovascular: Normal rate  and rhythm. S1,S2 noted.  No murmur, rubs or gallops noted. No JVD or BLE edema. No carotid bruits noted. Pulmonary/Chest: Normal effort and positive vesicular breath sounds. No respiratory distress. No wheezes, rales or ronchi noted.  Abdomen: Soft and nontender. Normal bowel sounds. Musculoskeletal: Strength 5/5 BUE/BLE. Joint enlargement noted in knees. No difficulty with gait.  Neurological: Alert and oriented. Coordination normal.  Psychiatric: Mood and affect normal.  Anxious appearing.  Judgment and thought content normal.    BMET    Component Value Date/Time   NA 141 05/17/2023 1213   NA 142 06/24/2015 1102   NA 135 (L) 02/02/2013 1021   K 3.5 05/17/2023 1213   K 3.7 02/02/2013 1021   CL 101 05/17/2023 1213   CL 103 02/02/2013 1021   CO2 32 05/17/2023 1213   CO2 30 02/02/2013 1021   GLUCOSE 137 (H) 05/17/2023 1213   GLUCOSE 152 (H) 02/02/2013 1021   BUN 17 05/17/2023 1213   BUN 12 06/24/2015 1102   BUN 8 02/02/2013 1021   CREATININE 1.09 (H) 05/17/2023 1213   CREATININE 1.06 (H) 04/29/2023 1134   CALCIUM  9.3 05/17/2023 1213   CALCIUM  9.9 02/02/2013 1021   GFRNONAA 53 (L) 05/17/2023 1213   GFRNONAA 72 12/03/2019 0947   GFRAA 84 12/03/2019 0947    Lipid Panel     Component Value Date/Time   CHOL 117 08/28/2022 1445   CHOL 205 (H) 02/03/2015 1022   TRIG 197 (H) 08/28/2022 1445   HDL 42 (L) 08/28/2022 1445   HDL 36 (L) 02/03/2015 1022   CHOLHDL 2.8 08/28/2022 1445   LDLCALC 48 08/28/2022 1445    CBC    Component Value Date/Time   WBC 7.4 05/17/2023 1213   RBC 4.38 05/17/2023 1213   HGB 12.5 05/17/2023 1213   HGB 15.6 06/24/2015 1102   HCT 41.2 05/17/2023 1213   HCT 46.6 06/24/2015 1102   PLT 282 05/17/2023 1213   PLT 307 06/24/2015 1102   MCV 94.1 05/17/2023 1213   MCV 93 06/24/2015 1102   MCV 90 02/02/2013 1021   MCH 28.5 05/17/2023 1213   MCHC 30.3 05/17/2023 1213   RDW 13.6 05/17/2023 1213   RDW 13.3 06/24/2015 1102   RDW 13.2 02/02/2013 1021    LYMPHSABS 2.2 05/17/2023 1213   LYMPHSABS 2.3 06/24/2015 1102   MONOABS 0.8 05/17/2023 1213   EOSABS 0.6 (H) 05/17/2023 1213   EOSABS 0.3 06/24/2015 1102   BASOSABS 0.1 05/17/2023 1213   BASOSABS 0.1 06/24/2015 1102    Hgb A1C Lab Results  Component Value Date   HGBA1C 6.4 (A) 02/08/2023            Assessment & Plan:   Cough:  Has had recent exposure to COVID COVID test positive today Encourage rest and fluids Can take Tylenol  1000 mg every 8 hours as needed for fever or bodyaches Rx for Robitussin AC cough syrup Discussed Paxlovid however insurance has not been covering this so we will hold off at this time    RTC in 6 months for your annual exam Helayne Lo, NP

## 2023-06-07 NOTE — Assessment & Plan Note (Signed)
C-Met and lipid profile today Encouraged her to consume a low-fat diet Continue rosuvastatin and aspirin

## 2023-06-07 NOTE — Assessment & Plan Note (Signed)
 Continue breo and albuterol  She will continue to follow with pulmonology

## 2023-06-07 NOTE — Assessment & Plan Note (Signed)
C-Met and lipid profile today  Encouraged her to consume a low-fat diet Continue rosuvastatin 

## 2023-06-07 NOTE — Assessment & Plan Note (Signed)
 Encourage weight loss as this can help to reduce reflux symptoms Avoid foods that trigger reflux Continue pantoprazole .

## 2023-06-07 NOTE — Assessment & Plan Note (Signed)
 TSH and free T4 today We will adjust levothyroxine if needed based on labs

## 2023-06-07 NOTE — Assessment & Plan Note (Signed)
 Continue diltiazem  and lisinopril  Reinforced DASH diet and exercise for weight loss C-Met today

## 2023-06-07 NOTE — Assessment & Plan Note (Signed)
Continue clonazepam at bedtime

## 2023-06-08 LAB — COMPREHENSIVE METABOLIC PANEL WITH GFR
AG Ratio: 1.2 (calc) (ref 1.0–2.5)
ALT: 10 U/L (ref 6–29)
AST: 17 U/L (ref 10–35)
Albumin: 3.6 g/dL (ref 3.6–5.1)
Alkaline phosphatase (APISO): 76 U/L (ref 37–153)
BUN: 14 mg/dL (ref 7–25)
CO2: 29 mmol/L (ref 20–32)
Calcium: 8.8 mg/dL (ref 8.6–10.4)
Chloride: 101 mmol/L (ref 98–110)
Creat: 0.85 mg/dL (ref 0.60–1.00)
Globulin: 3 g/dL (ref 1.9–3.7)
Glucose, Bld: 103 mg/dL — ABNORMAL HIGH (ref 65–99)
Potassium: 4 mmol/L (ref 3.5–5.3)
Sodium: 139 mmol/L (ref 135–146)
Total Bilirubin: 0.3 mg/dL (ref 0.2–1.2)
Total Protein: 6.6 g/dL (ref 6.1–8.1)
eGFR: 72 mL/min/{1.73_m2} (ref >=60)

## 2023-06-08 LAB — CBC
HCT: 39.4 % (ref 35.0–45.0)
Hemoglobin: 12.3 g/dL (ref 11.7–15.5)
MCH: 28.1 pg (ref 27.0–33.0)
MCHC: 31.2 g/dL — ABNORMAL LOW (ref 32.0–36.0)
MCV: 90 fL (ref 80.0–100.0)
MPV: 10.9 fL (ref 7.5–12.5)
Platelets: 247 10*3/uL (ref 140–400)
RBC: 4.38 10*6/uL (ref 3.80–5.10)
RDW: 13.8 % (ref 11.0–15.0)
WBC: 5.9 10*3/uL (ref 3.8–10.8)

## 2023-06-08 LAB — HEMOGLOBIN A1C
Hgb A1c MFr Bld: 7.3 % — ABNORMAL HIGH (ref <5.7)
Mean Plasma Glucose: 163 mg/dL
eAG (mmol/L): 9 mmol/L

## 2023-06-08 LAB — T4, FREE: Free T4: 1 ng/dL (ref 0.8–1.8)

## 2023-06-08 LAB — LIPID PANEL
Cholesterol: 84 mg/dL (ref <200)
HDL: 36 mg/dL — ABNORMAL LOW (ref >=50)
LDL Cholesterol (Calc): 28 mg/dL
Non-HDL Cholesterol (Calc): 48 mg/dL (ref <130)
Total CHOL/HDL Ratio: 2.3 (calc) (ref <5.0)
Triglycerides: 117 mg/dL (ref <150)

## 2023-06-08 LAB — TSH: TSH: 14.08 m[IU]/L — ABNORMAL HIGH (ref 0.40–4.50)

## 2023-06-08 LAB — MICROALBUMIN / CREATININE URINE RATIO
Creatinine, Urine: 119 mg/dL (ref 20–275)
Microalb Creat Ratio: 7 mg/g{creat} (ref <30)
Microalb, Ur: 0.8 mg/dL

## 2023-06-10 ENCOUNTER — Other Ambulatory Visit: Payer: Self-pay

## 2023-06-10 MED ORDER — METFORMIN HCL ER 500 MG PO TB24: 500.0000 mg | ORAL_TABLET | Freq: Every day | ORAL | 0 refills | Status: DC

## 2023-06-10 MED ORDER — LEVOTHYROXINE SODIUM 112 MCG PO TABS: 112.0000 ug | ORAL_TABLET | Freq: Every day | ORAL | 0 refills | Status: DC

## 2023-06-10 NOTE — Addendum Note (Signed)
 Addended by: Carollynn Cirri on: 06/10/2023 08:59 AM   Modules accepted: Orders

## 2023-06-11 ENCOUNTER — Telehealth: Payer: Self-pay

## 2023-06-17 DIAGNOSIS — J9610 Chronic respiratory failure, unspecified whether with hypoxia or hypercapnia: Secondary | ICD-10-CM | POA: Diagnosis not present

## 2023-06-17 DIAGNOSIS — J449 Chronic obstructive pulmonary disease, unspecified: Secondary | ICD-10-CM | POA: Diagnosis not present

## 2023-06-21 ENCOUNTER — Other Ambulatory Visit: Payer: Self-pay | Admitting: Internal Medicine

## 2023-06-21 DIAGNOSIS — I10 Essential (primary) hypertension: Secondary | ICD-10-CM

## 2023-06-21 DIAGNOSIS — F410 Panic disorder [episodic paroxysmal anxiety] without agoraphobia: Secondary | ICD-10-CM

## 2023-06-24 NOTE — Telephone Encounter (Signed)
 Requested medications are due for refill today.  yes  Requested medications are on the active medications list.  yes  Last refill. 05/27/2023 #90 0 rf  Future visit scheduled.   yes  Notes to clinic.  Refill not delegated.    Requested Prescriptions  Pending Prescriptions Disp Refills   clonazePAM  (KLONOPIN ) 0.5 MG tablet [Pharmacy Med Name: CLONAZEPAM  0.5MG  TABLETS] 90 tablet     Sig: TAKE 1 TABLET BY MOUTH EVERY MORNING, 1 TABLET EVERY AFTERNOON, AND 1 TABLET EVERY EVENING.     Not Delegated - Psychiatry: Anxiolytics/Hypnotics 2 Failed - 06/24/2023  4:21 PM      Failed - This refill cannot be delegated      Failed - Urine Drug Screen completed in last 360 days      Passed - Patient is not pregnant      Passed - Valid encounter within last 6 months    Recent Outpatient Visits           2 weeks ago Type 2 diabetes mellitus without complication, without long-term current use of insulin  [facility])   [place] [facility] [place], [place] W, NP   1 month ago Acute cystitis without hematuria   [place] [facility] [place], [place] Buzzard, NP              Refused Prescriptions Disp Refills   carvedilol  (COREG ) 3.125 MG tablet [Pharmacy Med Name: CARVEDILOL  3.125MG  TABLETS] 180 tablet 0    Sig: TAKE 1 TABLET BY MOUTH TWICE DAILY WITH A MEAL     Cardiovascular: Beta Blockers 3 Passed - 06/24/2023  4:21 PM      Passed - Cr in normal range and within 360 days    Creat  Date Value Ref Range Status  06/07/2023 0.85 0.60 - 1.00 mg/dL Final   Creatinine, Urine  Date Value Ref Range Status  06/07/2023 119 20 - 275 mg/dL Final         Passed - AST in normal range and within 360 days    AST  Date Value Ref Range Status  06/07/2023 17 10 - 35 U/L Final         Passed - ALT in normal range and within 360 days    ALT  Date Value Ref Range Status  06/07/2023 10 6 - 29 U/L Final         Passed - Last BP in normal range    BP Readings from Last 1  Encounters:  06/07/23 118/62         Passed - Last Heart Rate in normal range    Pulse Readings from Last 1 Encounters:  05/17/23 73         Passed - Valid encounter within last 6 months    Recent Outpatient Visits           2 weeks ago Type 2 diabetes mellitus without complication, without long-term current use of insulin  [facility])   [place] [facility] [place], Rankin Buzzard, NP   1 month ago Acute cystitis without hematuria   [place] [facility] [place], Rankin Buzzard, [place]

## 2023-06-24 NOTE — Telephone Encounter (Signed)
 Requested Prescriptions  Pending Prescriptions Disp Refills   clonazePAM  (KLONOPIN ) 0.5 MG tablet [Pharmacy Med Name: CLONAZEPAM  0.5MG  TABLETS] 90 tablet     Sig: TAKE 1 TABLET BY MOUTH EVERY MORNING, 1 TABLET EVERY AFTERNOON, AND 1 TABLET EVERY EVENING.     Not Delegated - Psychiatry: Anxiolytics/Hypnotics 2 Failed - 06/24/2023  4:20 PM      Failed - This refill cannot be delegated      Failed - Urine Drug Screen completed in last 360 days      Passed - Patient is not pregnant      Passed - Valid encounter within last 6 months    Recent Outpatient Visits           2 weeks ago Type 2 diabetes mellitus without complication, without long-term current use of insulin  (HCC)   [place] [facility] [place], [place] W, NP   1 month ago Acute cystitis without hematuria   [place] [facility] [place], Rankin Buzzard, NP               carvedilol  (COREG ) 3.125 MG tablet [Pharmacy Med Name: CARVEDILOL  3.125MG  TABLETS] 180 tablet 0    Sig: TAKE 1 TABLET BY MOUTH TWICE DAILY WITH A MEAL     Cardiovascular: Beta Blockers 3 Passed - 06/24/2023  4:20 PM      Passed - Cr in normal range and within 360 days    Creat  Date Value Ref Range Status  06/07/2023 0.85 0.60 - 1.00 mg/dL Final   Creatinine, Urine  Date Value Ref Range Status  06/07/2023 119 20 - 275 mg/dL Final         Passed - AST in normal range and within 360 days    AST  Date Value Ref Range Status  06/07/2023 17 10 - 35 U/L Final         Passed - ALT in normal range and within 360 days    ALT  Date Value Ref Range Status  06/07/2023 10 6 - 29 U/L Final         Passed - Last BP in normal range    BP Readings from Last 1 Encounters:  06/07/23 118/62         Passed - Last Heart Rate in normal range    Pulse Readings from Last 1 Encounters:  05/17/23 73         Passed - Valid encounter within last 6 months    Recent Outpatient Visits           2 weeks ago Type 2 diabetes mellitus  without complication, without long-term current use of insulin  [facility])   [place] [facility] [place], Rankin Buzzard, NP   1 month ago Acute cystitis without hematuria   [place] [facility] [place], Rankin Buzzard, [place]

## 2023-06-30 DIAGNOSIS — Z03818 Encounter for observation for suspected exposure to other biological agents ruled out: Secondary | ICD-10-CM | POA: Diagnosis not present

## 2023-06-30 DIAGNOSIS — J441 Chronic obstructive pulmonary disease with (acute) exacerbation: Secondary | ICD-10-CM | POA: Diagnosis not present

## 2023-07-02 ENCOUNTER — Other Ambulatory Visit: Payer: Self-pay | Admitting: Internal Medicine

## 2023-07-02 ENCOUNTER — Telehealth: Payer: Self-pay

## 2023-07-02 DIAGNOSIS — J42 Unspecified chronic bronchitis: Secondary | ICD-10-CM

## 2023-07-02 NOTE — Telephone Encounter (Signed)
 I cannot see any of her notes from her acute care visit.  What specific questions does she have? if she is having difficulty breathing, then she may need to be seen for further evaluation either with me or urgent care.

## 2023-07-02 NOTE — Telephone Encounter (Signed)
 Copied from CRM [id number]. Topic: Clinical - Medication Question >> Jul 02, 2023 11:31 AM El Gravely T wrote: Reason for CRM: Tanya Fantasia, patient therapist [phone]) calling on behalf of patient (patient present at time of call) with concerns of interactions with medications prescribed by acute care with difficulty breathing, patient seen over the weekend.  Medications in question are, Prednisone , Promethazine, and Doxycycline .  Requesting a return call to discuss concerns further with patient at [phone].

## 2023-07-02 NOTE — Telephone Encounter (Signed)
 Noted.  Will discuss at upcoming appointment tomorrow

## 2023-07-02 NOTE — Telephone Encounter (Signed)
 Spoke with Nekia, she stated she went to [facility] acute care on Sunday. Reoccurrence of COPD. She does have an appointment with tomorrow.

## 2023-07-03 DIAGNOSIS — J441 Chronic obstructive pulmonary disease with (acute) exacerbation: Secondary | ICD-10-CM | POA: Diagnosis not present

## 2023-07-04 NOTE — Telephone Encounter (Signed)
 Requested Prescriptions  Pending Prescriptions Disp Refills   albuterol  (VENTOLIN  HFA) 108 (90 Base) MCG/ACT inhaler [Pharmacy Med Name: ALBUTEROL  HFA INH (200 PUFFS) 8.5GM] 8.5 g 2    Sig: INHALE 2 PUFFS BY MOUTH EVERY 6 HOURS AS NEEDED FOR WHEEZING OR SHORTNESS OF BREATH     Pulmonology:  Beta Agonists 2 Passed - 07/04/2023  3:38 PM      Passed - Last BP in normal range    BP Readings from Last 1 Encounters:  06/07/23 118/62         Passed - Last Heart Rate in normal range    Pulse Readings from Last 1 Encounters:  05/17/23 73         Passed - Valid encounter within last 12 months    Recent Outpatient Visits           3 weeks ago Type 2 diabetes mellitus without complication, without long-term current use of insulin  [facility])   [facility] [place], [place] W, NP   2 months ago Acute cystitis without hematuria   [facility] [place], [place], [place]

## 2023-07-09 ENCOUNTER — Other Ambulatory Visit: Payer: Self-pay

## 2023-07-09 NOTE — Patient Outreach (Unsigned)
 Complex Care Management   Visit Note  07/09/2023  Name:  [name] MRN: [id number] DOB: 07-23-1950  Situation: Referral received for Complex Care Management related to {Criteria:32550} I obtained verbal consent from {CHL AMB Patient/Caregiver:28184}.  Visit completed with ***  {VISIT LOCATION:32553}  Background:   Past Medical History:  Diagnosis Date   Anxiety    Arthritis    Chronic back pain    COPD (chronic obstructive pulmonary disease) (HCC)    Depression    GERD (gastroesophageal reflux disease)    Hypertension    Hypothyroidism    Neuromuscular disorder (HCC)    Pneumonia    RECENT X 3   PONV (postoperative nausea and vomiting)    Psoriasis    Thyroid  disease     Assessment: Patient Reported Symptoms:  Cognitive        Neurological      HEENT        Cardiovascular      Respiratory      Endocrine      Gastrointestinal        Genitourinary      Integumentary      Musculoskeletal          Psychosocial              07/09/2023    9:57 AM  Depression screen PHQ 2/9  Decreased Interest 1  Down, Depressed, Hopeless 0  PHQ - 2 Score 1    There were no vitals filed for this visit.  Medications Reviewed Today     Reviewed by Roxie Cord, RN (Registered Nurse) on 07/09/23 at [phone]  Med List Status: <None>   Medication Order Taking? Sig Documenting Provider Last Dose Status Informant  acetaminophen  (TYLENOL ) 500 MG tablet [id number]  Take 500 mg by mouth every 6 (six) hours as needed for moderate pain.  [provider]  Active Pharmacy Records, Family Member           Med Note Andy Bannister, [place] Class   Wed Apr 17, 2023  4:20 PM) prn  albuterol  (VENTOLIN  HFA) 108 (90 Base) MCG/ACT inhaler [id number]  INHALE 2 PUFFS BY MOUTH EVERY 6 HOURS AS NEEDED FOR WHEEZING OR SHORTNESS OF BREATH Carollynn Cirri, NP  Active   aspirin  EC (ASPIRIN  LOW DOSE) 81 MG tablet [id number] No TAKE 1 TABLET(81 MG) BY MOUTH DAILY. SWALLOW WHOLE  Patient not  taking: No sig reported   Carollynn Cirri, NP Not Taking Consider Medication Status and Discontinue (Change in therapy) Pharmacy Records, Family Member  benzonatate  (TESSALON ) 100 MG capsule [id number] No Take 1 capsule (100 mg total) by mouth 3 (three) times daily as needed for cough.  Patient not taking: Reported on 07/09/2023   Carollynn Cirri, NP Not Taking Consider Medication Status and Discontinue (Completed Course)   citalopram  (CELEXA ) 20 MG tablet [id number]  Take 1 tablet (20 mg total) by mouth daily. Carollynn Cirri, NP  Active   clonazePAM  (KLONOPIN ) 0.5 MG tablet [id number]  TAKE 1 TABLET BY MOUTH EVERY MORNING, 1 TABLET EVERY AFTERNOON, AND 1 TABLET EVERY EVENING. Carollynn Cirri, NP  Active   cyanocobalamin  1000 MCG tablet [id number]  Take 1 tablet (1,000 mcg total) by mouth daily. Althia Atlas, MD  Active   diltiazem  (CARDIZEM  LA) 120 MG 24 hr tablet [id number]  Take 1 tablet (120 mg total) by mouth daily. Carollynn Cirri, NP  Active   fenofibrate  (TRICOR ) 145 MG tablet [id number]  Take 1 tablet (145 mg total) by  mouth daily. Carollynn Cirri, NP  Active Family Member, Pharmacy Records  fluticasone  furoate-vilanterol (BREO ELLIPTA ) 200-25 MCG/ACT AEPB [id number]  Inhale 1 puff into the lungs daily. Carollynn Cirri, NP  Active   levothyroxine  (SYNTHROID ) 112 MCG tablet [id number]  Take 1 tablet (112 mcg total) by mouth daily. Carollynn Cirri, NP  Active   metFORMIN  (GLUCOPHAGE -XR) 500 MG 24 hr tablet [id number]  Take 1 tablet (500 mg total) by mouth daily with breakfast. Carollynn Cirri, NP  Active   Misc. Devices (QUAD CANE/SMALL BASE) MISC [id number]  1 Device by Does not apply route daily. Brenda Calkin, NP  Active Pharmacy Records, Family Member  OXYGEN  [id number]  Inhale 2 L/min into the lungs as needed. [provider]  Active   pantoprazole  (PROTONIX ) 40 MG tablet [id number]  SMARTSIG:1 Tablet(s) By Mouth Every Evening [provider]  Active Pharmacy Records, Family  Member           Med Note [facility]   Tue Jul 09, 2023  9:41 AM) Reports only taking as needed  rosuvastatin  (CRESTOR ) 20 MG tablet [id number]  Take 1 tablet (20 mg total) by mouth daily. Carollynn Cirri, NP  Active Pharmacy Records, Family Member  senna (SENOKOT) 8.6 MG TABS tablet [id number]  Take 1 tablet (8.6 mg total) by mouth 2 (two) times daily as needed for mild constipation. Noble Bateman, PA  Active Family Member, Pharmacy Records           Med Note Andy Bannister, [place] Class   Wed Apr 17, 2023  4:20 PM) prn  triamcinolone  ointment (KENALOG ) 0.1 % [id number]  Apply 1 Application topically as needed. Pharmacy, MD  Active Family Member, Pharmacy Records           Med Note [place], [place]   Tue Mar 26, 2023 11:05 AM) prn            Recommendation:   {RECOMMENDATONS:32554}  Follow Up Plan:   {FOLLOWUP:32559}  SIG ***

## 2023-07-09 NOTE — Patient Instructions (Signed)
Thank you for allowing the Chronic Care Management team to participate in your care.  

## 2023-07-18 DIAGNOSIS — J449 Chronic obstructive pulmonary disease, unspecified: Secondary | ICD-10-CM | POA: Diagnosis not present

## 2023-07-18 DIAGNOSIS — J9610 Chronic respiratory failure, unspecified whether with hypoxia or hypercapnia: Secondary | ICD-10-CM | POA: Diagnosis not present

## 2023-07-22 ENCOUNTER — Telehealth: Payer: Self-pay

## 2023-07-23 ENCOUNTER — Other Ambulatory Visit: Payer: Self-pay | Admitting: Internal Medicine

## 2023-07-23 DIAGNOSIS — F410 Panic disorder [episodic paroxysmal anxiety] without agoraphobia: Secondary | ICD-10-CM

## 2023-07-25 ENCOUNTER — Other Ambulatory Visit: Payer: Self-pay | Admitting: Internal Medicine

## 2023-07-25 DIAGNOSIS — F410 Panic disorder [episodic paroxysmal anxiety] without agoraphobia: Secondary | ICD-10-CM

## 2023-07-25 NOTE — Telephone Encounter (Signed)
 Requested medication (s) are due for refill today: yes  Requested medication (s) are on the active medication list: yes  Last refill:  06/25/23  Future visit scheduled: yes  Notes to clinic:  Unable to refill per protocol, cannot delegate.      Requested Prescriptions  Pending Prescriptions Disp Refills   clonazePAM  (KLONOPIN ) 0.5 MG tablet [Pharmacy Med Name: CLONAZEPAM  0.5MG  TABLETS] 90 tablet     Sig: TAKE 1 TABLET BY MOUTH EVERY MORNING AND EVERY AFTERNOON AND EVERY EVENING     Not Delegated - Psychiatry: Anxiolytics/Hypnotics 2 Failed - 07/25/2023 11:42 AM      Failed - This refill cannot be delegated      Failed - Urine Drug Screen completed in last 360 days      Passed - Patient is not pregnant      Passed - Valid encounter within last 6 months    Recent Outpatient Visits           1 month ago Type 2 diabetes mellitus without complication, without long-term current use of insulin  [facility])   [facility] [place], [place] W, NP   2 months ago Acute cystitis without hematuria   [facility] [place], [place], [place]

## 2023-07-25 NOTE — Telephone Encounter (Unsigned)
 Copied from CRM [id number]. Topic: Clinical - Medication Refill >> Jul 25, 2023 10:28 AM Rosamond Comes wrote: Medication: clonazePAM  (KLONOPIN ) 0.5 MG tablet   Has the patient contacted their pharmacy? Yes Pharmacy sent in refill request on  07/23/23  This is the patient's preferred pharmacy:  [id number] [place], [place] - [address] AT [facility] [place] [address] [place] [address] Phone: [phone] Fax: [phone]    Is this the correct pharmacy for this prescription? Yes If no, delete pharmacy and type the correct one.   Has the prescription been filled recently? Yes  Is the patient out of the medication? Yes  Has the patient been seen for an appointment in the last year OR does the patient have an upcoming appointment? Yes  Can we respond through MyChart? No  Agent: Please be advised that Rx refills may take up to 3 business days. We ask that you follow-up with your pharmacy.

## 2023-07-26 NOTE — Telephone Encounter (Signed)
 Requested medications are due for refill today.  no  Requested medications are on the active medications list.  yes  Last refill. 07/25/2023  Future visit scheduled.   yes  Notes to clinic.  Refill/refusal not delegated.    Requested Prescriptions  Pending Prescriptions Disp Refills   clonazePAM  (KLONOPIN ) 0.5 MG tablet 90 tablet 0    Sig: TAKE 1 TABLET BY MOUTH EVERY MORNING, 1 TABLET EVERY AFTERNOON, AND 1 TABLET EVERY EVENING.     Not Delegated - Psychiatry: Anxiolytics/Hypnotics 2 Failed - 07/26/2023  5:32 PM      Failed - This refill cannot be delegated      Failed - Urine Drug Screen completed in last 360 days      Passed - Patient is not pregnant      Passed - Valid encounter within last 6 months    Recent Outpatient Visits           1 month ago Type 2 diabetes mellitus without complication, without long-term current use of insulin  [facility])   [place] [facility] [place], [place] W, NP   2 months ago Acute cystitis without hematuria   [facility] [place], [place], [place]

## 2023-07-27 ENCOUNTER — Emergency Department
Admission: EM | Admit: 2023-07-27 | Discharge: 2023-07-27 | Disposition: A | Discharge status: Home / Self Care | Attending: Emergency Medicine | Admitting: Emergency Medicine

## 2023-07-27 ENCOUNTER — Emergency Department

## 2023-07-27 ENCOUNTER — Encounter: Payer: Self-pay | Admitting: Intensive Care

## 2023-07-27 ENCOUNTER — Other Ambulatory Visit: Payer: Self-pay

## 2023-07-27 DIAGNOSIS — Z79899 Other long term (current) drug therapy: Secondary | ICD-10-CM | POA: Insufficient documentation

## 2023-07-27 DIAGNOSIS — R296 Repeated falls: Secondary | ICD-10-CM | POA: Diagnosis not present

## 2023-07-27 DIAGNOSIS — I700 Atherosclerosis of aorta: Secondary | ICD-10-CM | POA: Diagnosis not present

## 2023-07-27 DIAGNOSIS — I10 Essential (primary) hypertension: Secondary | ICD-10-CM | POA: Insufficient documentation

## 2023-07-27 DIAGNOSIS — G629 Polyneuropathy, unspecified: Secondary | ICD-10-CM | POA: Diagnosis not present

## 2023-07-27 DIAGNOSIS — K4090 Unilateral inguinal hernia, without obstruction or gangrene, not specified as recurrent: Secondary | ICD-10-CM | POA: Diagnosis not present

## 2023-07-27 DIAGNOSIS — J449 Chronic obstructive pulmonary disease, unspecified: Secondary | ICD-10-CM | POA: Diagnosis not present

## 2023-07-27 DIAGNOSIS — E278 Other specified disorders of adrenal gland: Secondary | ICD-10-CM | POA: Diagnosis not present

## 2023-07-27 DIAGNOSIS — Z87891 Personal history of nicotine dependence: Secondary | ICD-10-CM | POA: Diagnosis not present

## 2023-07-27 DIAGNOSIS — M79605 Pain in left leg: Secondary | ICD-10-CM | POA: Diagnosis not present

## 2023-07-27 DIAGNOSIS — M79672 Pain in left foot: Secondary | ICD-10-CM | POA: Insufficient documentation

## 2023-07-27 DIAGNOSIS — K5730 Diverticulosis of large intestine without perforation or abscess without bleeding: Secondary | ICD-10-CM | POA: Diagnosis not present

## 2023-07-27 DIAGNOSIS — R609 Edema, unspecified: Secondary | ICD-10-CM | POA: Diagnosis not present

## 2023-07-27 LAB — PROTIME-INR
INR: 1 (ref 0.8–1.2)
Prothrombin Time: 13.2 s (ref 11.4–15.2)

## 2023-07-27 LAB — CBC WITH DIFFERENTIAL/PLATELET
Abs Immature Granulocytes: 0.03 10*3/uL (ref 0.00–0.07)
Basophils Absolute: 0.1 10*3/uL (ref 0.0–0.1)
Basophils Relative: 1 %
Eosinophils Absolute: 0.5 10*3/uL (ref 0.0–0.5)
Eosinophils Relative: 7 %
HCT: 40.7 % (ref 36.0–46.0)
Hemoglobin: 12.9 g/dL (ref 12.0–15.0)
Immature Granulocytes: 0 %
Lymphocytes Relative: 28 %
Lymphs Abs: 2 10*3/uL (ref 0.7–4.0)
MCH: 29 pg (ref 26.0–34.0)
MCHC: 31.7 g/dL (ref 30.0–36.0)
MCV: 91.5 fL (ref 80.0–100.0)
Monocytes Absolute: 0.7 10*3/uL (ref 0.1–1.0)
Monocytes Relative: 10 %
Neutro Abs: 3.9 10*3/uL (ref 1.7–7.7)
Neutrophils Relative %: 54 %
Platelets: 251 10*3/uL (ref 150–400)
RBC: 4.45 MIL/uL (ref 3.87–5.11)
RDW: 14 % (ref 11.5–15.5)
WBC: 7.2 10*3/uL (ref 4.0–10.5)
nRBC: 0 % (ref 0.0–0.2)

## 2023-07-27 LAB — COMPREHENSIVE METABOLIC PANEL WITH GFR
ALT: 14 U/L (ref 0–44)
AST: 28 U/L (ref 15–41)
Albumin: 3.4 g/dL — ABNORMAL LOW (ref 3.5–5.0)
Alkaline Phosphatase: 55 U/L (ref 38–126)
Anion gap: 10 (ref 5–15)
BUN: 17 mg/dL (ref 8–23)
CO2: 26 mmol/L (ref 22–32)
Calcium: 9.3 mg/dL (ref 8.9–10.3)
Chloride: 102 mmol/L (ref 98–111)
Creatinine, Ser: 0.68 mg/dL (ref 0.44–1.00)
GFR, Estimated: >60 mL/min (ref >60)
Glucose, Bld: 134 mg/dL — ABNORMAL HIGH (ref 70–99)
Potassium: 3.6 mmol/L (ref 3.5–5.1)
Sodium: 138 mmol/L (ref 135–145)
Total Bilirubin: 0.4 mg/dL (ref 0.0–1.2)
Total Protein: 6.5 g/dL (ref 6.5–8.1)

## 2023-07-27 LAB — BRAIN NATRIURETIC PEPTIDE: B Natriuretic Peptide: 147.5 pg/mL — ABNORMAL HIGH (ref 0.0–100.0)

## 2023-07-27 LAB — APTT: aPTT: 28 s (ref 24–36)

## 2023-07-27 MED ORDER — HYDROCODONE-ACETAMINOPHEN 5-325 MG PO TABS: 1.0000 | ORAL_TABLET | ORAL | 0 refills | Status: DC | PRN

## 2023-07-27 MED ORDER — HEPARIN BOLUS VIA INFUSION
4200.0000 [IU] | Freq: Once | INTRAVENOUS | Status: AC
  Administered 2023-07-27: 4200 [IU] via INTRAVENOUS
  Filled 2023-07-27: qty 4200

## 2023-07-27 MED ORDER — IOHEXOL 350 MG/ML SOLN
100.0000 mL | Freq: Once | INTRAVENOUS | Status: AC | PRN
  Administered 2023-07-27: 100 mL via INTRAVENOUS

## 2023-07-27 MED ORDER — HEPARIN (PORCINE) 25000 UT/250ML-% IV SOLN
1200.0000 [IU]/h | INTRAVENOUS | Status: DC
  Administered 2023-07-27: 1200 [IU]/h via INTRAVENOUS
  Filled 2023-07-27: qty 250

## 2023-07-27 MED ORDER — HYDROCODONE-ACETAMINOPHEN 5-325 MG PO TABS
1.0000 | ORAL_TABLET | Freq: Once | ORAL | Status: AC
  Administered 2023-07-27: 1 via ORAL
  Filled 2023-07-27: qty 1

## 2023-07-27 NOTE — Consult Note (Signed)
 PHARMACY - ANTICOAGULATION CONSULT NOTE  Pharmacy Consult for Heparin  Indication: Ischemic Foot  Allergies  Allergen Reactions   Ciprofloxacin Hives and Diarrhea   Gabapentin  Other (See Comments)    Hallucination   Trazodone  And Nefazodone Other (See Comments)    Altered Mental Status    Trazodone  Other (See Comments)   Mucinex  [Guaifenesin  Er] Nausea Only   Oxycodone  Diarrhea   Sulfa Antibiotics Rash    Throat, mouth Throat, mouth   Patient Measurements: Height: 5' 3.5 (161.3 cm) Weight: 81.6 kg (180 lb) IBW/kg (Calculated) : 53.55 HEPARIN  DW (KG): 71.4  Vital Signs: Temp: 98 F (36.7 C) (06/21 1020) Temp Source: Oral (06/21 1020) BP: 143/74 (06/21 1020) Pulse Rate: 94 (06/21 1020)  Labs: Recent Labs    07/27/23 1025  HGB 12.9  HCT 40.7  PLT 251  CREATININE 0.68   Estimated Creatinine Clearance: 63.1 mL/min (by C-G formula based on SCr of 0.68 mg/dL).  Medical History: Past Medical History:  Diagnosis Date   Anxiety    Arthritis    Chronic back pain    COPD (chronic obstructive pulmonary disease) (HCC)    Depression    GERD (gastroesophageal reflux disease)    Hypertension    Hypothyroidism    Neuromuscular disorder (HCC)    Pneumonia    RECENT X 3   PONV (postoperative nausea and vomiting)    Psoriasis    Thyroid  disease    Medications:  No PTA anticoagulation noted from chart review   Assessment: 73 year old female that presented with left foot numbness & tingling for a few days. Patient reports her right foot is now starting to feel the same. Bilateral lower extremities are purple and cold to the touch. Imaging has been ordered to rule out DVT and PE. From chart review, patient was not on any anticoagulation prior to admission. Pharmacy has been consulted for initiation and management of a heparin  infusion.   Baseline labs: Hgb 12.9 PLT 251  aPTT & INR have been ordered  Goal of Therapy:  Heparin  level 0.3-0.7 units/ml Monitor platelets  by anticoagulation protocol: Yes   Plan:  Give 4200 unit bolus x 1 Start heparin  infusion at 1200 units/hr Check HL 8 hours after start of infusion  Monitor CBC daily and for s/sx of bleeding   Evonnie Nieves, PharmD Pharmacy Resident  07/27/2023 11:37 AM

## 2023-07-27 NOTE — Discharge Instructions (Signed)
 Please call the number provided for neurosurgery to arrange a follow-up appointment soon as you are able.  Return to the emergency department for any significant increase in pain or any other symptom personally concerning to yourself.  You have been prescribed pain medication, please only take as prescribed.  Do not drink alcohol  or drive while taking pain medication.

## 2023-07-27 NOTE — Consult Note (Signed)
 [facility] Vascular Consult Note  MRN : [id number]  [name] is a 73 y.o. (08/09/1950) female who presents with chief complaint of  Chief Complaint  Patient presents with   Leg Swelling  .  History of Present Illness: Pleasant 73yof with h/o COPD, GERD, HTN, back pain- h/o L3-4; L4-L% laminectomy in 2024. Frequent falls and intermittent chronic lower edema. Presents with complaints of progressive left foot pain and numbness- described as pins and needles that has worsened over the last few weeks. She also noted progressive discoloration of the left toes and forefoot. Denies arterial or venous claudication symptoms, denies rest pain. She states she has noted right foot symptoms also with pins and needles. She states her ambulation is limited by numbness in her feet. Her son states she has had more frequent falls since last year.   Current Facility-Administered Medications  Medication Dose Route Frequency Provider Last Rate Last Admin   heparin  ADULT infusion 100 units/mL (25000 units/250mL)  1,200 Units/hr Intravenous Continuous Meegan, Eryn, RPH 12 mL/hr at 07/27/23 1219 1,200 Units/hr at 07/27/23 1219   Current Outpatient Medications  Medication Sig Dispense Refill   acetaminophen  (TYLENOL ) 500 MG tablet Take 500 mg by mouth every 6 (six) hours as needed for moderate pain.      albuterol  (VENTOLIN  HFA) 108 (90 Base) MCG/ACT inhaler INHALE 2 PUFFS BY MOUTH EVERY 6 HOURS AS NEEDED FOR WHEEZING OR SHORTNESS OF BREATH 8.5 g 2   aspirin  EC (ASPIRIN  LOW DOSE) 81 MG tablet TAKE 1 TABLET(81 MG) BY MOUTH DAILY. SWALLOW WHOLE (Patient not taking: No sig reported) 90 tablet 1   benzonatate  (TESSALON ) 100 MG capsule Take 1 capsule (100 mg total) by mouth 3 (three) times daily as needed for cough. (Patient not taking: Reported on 07/09/2023) 30 capsule 0   citalopram  (CELEXA ) 20 MG tablet Take 1 tablet (20 mg total) by mouth daily. 90 tablet 0   clonazePAM  (KLONOPIN ) 0.5 MG  tablet TAKE 1 TABLET BY MOUTH EVERY MORNING AND EVERY AFTERNOON AND EVERY EVENING 90 tablet 0   diltiazem  (CARDIZEM  LA) 120 MG 24 hr tablet Take 1 tablet (120 mg total) by mouth daily.     fenofibrate  (TRICOR ) 145 MG tablet Take 1 tablet (145 mg total) by mouth daily. 90 tablet 0   fluticasone  furoate-vilanterol (BREO ELLIPTA ) 200-25 MCG/ACT AEPB Inhale 1 puff into the lungs daily. 1 each 11   levothyroxine  (SYNTHROID ) 112 MCG tablet Take 1 tablet (112 mcg total) by mouth daily. 90 tablet 0   metFORMIN  (GLUCOPHAGE -XR) 500 MG 24 hr tablet Take 1 tablet (500 mg total) by mouth daily with breakfast. 90 tablet 0   Misc. Devices (QUAD CANE/SMALL BASE) MISC 1 Device by Does not apply route daily. 1 each 0   OXYGEN  Inhale 2 L/min into the lungs as needed.     pantoprazole  (PROTONIX ) 40 MG tablet SMARTSIG:1 Tablet(s) By Mouth Every Evening     rosuvastatin  (CRESTOR ) 20 MG tablet Take 1 tablet (20 mg total) by mouth daily. 90 tablet 3   senna (SENOKOT) 8.6 MG TABS tablet Take 1 tablet (8.6 mg total) by mouth 2 (two) times daily as needed for mild constipation. 100 tablet 0   triamcinolone  ointment (KENALOG ) 0.1 % Apply 1 Application topically as needed.      Past Medical History:  Diagnosis Date   Anxiety    Arthritis    Chronic back pain    COPD (chronic obstructive pulmonary disease) (HCC)    Depression  GERD (gastroesophageal reflux disease)    Hypertension    Hypothyroidism    Neuromuscular disorder (HCC)    Pneumonia    RECENT X 3   PONV (postoperative nausea and vomiting)    Psoriasis    Thyroid  disease     Past Surgical History:  Procedure Laterality Date   ABDOMINAL HYSTERECTOMY     heavy bleeding   bladder stimulator     CARPAL TUNNEL RELEASE     CATARACT EXTRACTION W/PHACO Right 03/07/2015   Procedure: CATARACT EXTRACTION PHACO AND INTRAOCULAR LENS PLACEMENT (IOC);  Surgeon: Steven Dingeldein, MD;  Location: [facility];  Service: Ophthalmology;  Laterality: Right;  US   01:14 AP% 26.4 CDE 33.04 fluid pack lot # [id number]   CATARACT EXTRACTION W/PHACO Left 03/21/2015   Procedure: CATARACT EXTRACTION PHACO AND INTRAOCULAR LENS PLACEMENT (IOC);  Surgeon: Steven Dingeldein, MD;  Location: [facility];  Service: Ophthalmology;  Laterality: Left;  US  01:21 AP% 25.6 CDE 39.78 fluid pack lot # [id number]   HAND SURGERY Right    INTERSTIM IMPLANT REMOVAL N/A 09/07/2015   Procedure: REMOVAL OF RENNA IMPLANT;  Surgeon: Glendia Elizabeth, MD;  Location: [facility];  Service: Urology;  Laterality: N/A;   LUMBAR LAMINECTOMY/DECOMPRESSION MICRODISCECTOMY N/A 02/28/2023   Procedure: L3-4 LAMINECTOMY, L4-5 LAMINECTOMY AND DISCECTOMY OF LEFT SIDE, L5-S1 LAMINECTOMY;  Surgeon: Claudene Penne ORN, MD;  Location: [facility];  Service: Neurosurgery;  Laterality: N/A;   OOPHORECTOMY Right    ovarian cyst    Social History Social History   Tobacco Use   Smoking status: Former    Current packs/day: 0.00    Average packs/day: 1 pack/day for 35.0 years (35.0 ttl pk-yrs)    Types: Cigarettes    Start date: 04/19/2018    Quit date: 11/16/2020    Years since quitting: 2.6   Smokeless tobacco: Never   Tobacco comments:    Previously smoked 2 ppd.   Vaping Use   Vaping status: Never Used  Substance Use Topics   Alcohol  use: No   Drug use: No    Family History Family History  Problem Relation Age of Onset   Heart disease Mother    Breast cancer Mother    Heart attack Mother    COPD Father    Stroke Paternal Uncle    Kidney disease Sister    Breast cancer Cousin    Breast cancer Cousin    Heart attack Maternal Aunt    Stroke Paternal Grandfather    Kidney cancer Neg Hx    Bladder Cancer Neg Hx     Allergies  Allergen Reactions   Ciprofloxacin Hives and Diarrhea   Gabapentin  Other (See Comments)    Hallucination   Trazodone  And Nefazodone Other (See Comments)    Altered Mental Status    Trazodone  Other (See Comments)   Mucinex  [Guaifenesin  Er] Nausea Only    Oxycodone  Diarrhea   Sulfa Antibiotics Rash    Throat, mouth Throat, mouth     REVIEW OF SYSTEMS (Negative unless checked)  Constitutional: [] Weight loss  [] Fever  [] Chills Cardiac: [] Chest pain   [] Chest pressure   [] Palpitations   [] Shortness of breath when laying flat   [] Shortness of breath at rest   [] Shortness of breath with exertion. Vascular:  [] Pain in legs with walking   [] Pain in legs at rest   [] Pain in legs when laying flat   [] Claudication   [] Pain in feet when walking  [] Pain in feet at rest  [] Pain in feet when laying flat   []   History of DVT   [] Phlebitis   [] Swelling in legs   [] Varicose veins   [] Non-healing ulcers Pulmonary:   [] Uses home oxygen    [] Productive cough   [] Hemoptysis   [] Wheeze  [] COPD   [] Asthma Neurologic:  [] Dizziness  [] Blackouts   [] Seizures   [] History of stroke   [] History of TIA  [] Aphasia   [] Temporary blindness   [] Dysphagia   [] Weakness or numbness in arms   [] Weakness or numbness in legs Musculoskeletal:  [] Arthritis   [] Joint swelling   [] Joint pain   [x] Low back pain Hematologic:  [] Easy bruising  [] Easy bleeding   [] Hypercoagulable state   [] Anemic  [] Hepatitis Gastrointestinal:  [] Blood in stool   [] Vomiting blood  [] Gastroesophageal reflux/heartburn   [] Difficulty swallowing. Genitourinary:  [] Chronic kidney disease   [] Difficult urination  [] Frequent urination  [] Burning with urination   [] Blood in urine Skin:  [x] Rashes   [] Ulcers   [] Wounds Psychological:  [] History of anxiety   []  History of major depression.  Physical Examination  Vitals:   07/27/23 1020 07/27/23 1021  BP: (!) 143/74   Pulse: 94   Resp: 16   Temp: 98 F (36.7 C)   TempSrc: Oral   SpO2: 94%   Weight:  81.6 kg  Height:  5' 3.5 (1.613 m)   Body mass index is 31.39 kg/m. Gen:  WD/WN, NAD Head: [place]/AT, No temporalis wasting. Prominent temp pulse not noted. Ear/Nose/Throat: Hearing grossly intact, nares w/o erythema or drainage, oropharynx w/o  Erythema/Exudate Eyes: Sclera non-icteric, conjunctiva clear Neck: Trachea midline.  No JVD.  Pulmonary:  Good air movement, respirations not labored, equal bilaterally.  Cardiac: RRR, normal S1, S2. Vascular: Vessel Right Left  Radial Palpable Palpable  Ulnar Palpable Palpable  Brachial Palpable Palpable  Carotid Palpable, without bruit Palpable, without bruit  Aorta Not palpable N/A  Femoral Palpable Palpable  Popliteal Palpable Palpable  PT Palpable Palpable  DP Palpable Palpable   Gastrointestinal: soft, non-tender/non-distended. No guarding/reflex.  Musculoskeletal: M/S 5/5 throughout.  Extremities without ischemic changes.  No deformity or atrophy. Trace bilateral lower extremity. LEFT fore foot and toes - warm with hyperemia, +motor/+sensory, right foot warm Neurologic: Sensation grossly intact in extremities. Some numbness in bilateral feet- Symmetrical.  Speech is fluent. Motor exam as listed above. Psychiatric: Judgment intact, Mood & affect appropriate for pt's clinical situation. Dermatologic: psorasis plaques. No cellulitis or open wounds.     CBC Lab Results  Component Value Date   WBC 7.2 07/27/2023   HGB 12.9 07/27/2023   HCT 40.7 07/27/2023   MCV 91.5 07/27/2023   PLT 251 07/27/2023    BMET    Component Value Date/Time   NA 138 07/27/2023 1025   NA 142 06/24/2015 1102   NA 135 (L) 02/02/2013 1021   K 3.6 07/27/2023 1025   K 3.7 02/02/2013 1021   CL 102 07/27/2023 1025   CL 103 02/02/2013 1021   CO2 26 07/27/2023 1025   CO2 30 02/02/2013 1021   GLUCOSE 134 (H) 07/27/2023 1025   GLUCOSE 152 (H) 02/02/2013 1021   BUN 17 07/27/2023 1025   BUN 12 06/24/2015 1102   BUN 8 02/02/2013 1021   CREATININE 0.68 07/27/2023 1025   CREATININE 0.85 06/07/2023 1051   CALCIUM  9.3 07/27/2023 1025   CALCIUM  9.9 02/02/2013 1021   GFRNONAA >60 07/27/2023 1025   GFRNONAA 72 12/03/2019 0947   GFRAA 84 12/03/2019 0947   Estimated Creatinine Clearance: 63.1 mL/min  (by C-G formula based on SCr of 0.68  mg/dL).  COAG Lab Results  Component Value Date   INR 1.0 07/27/2023   INR 1.2 01/02/2023    Radiology CT Angio Aortobifemoral W and/or Wo Contrast Result Date: 07/27/2023 CLINICAL DATA:  Purple and cold bilateral lower extremities EXAM: CT ANGIOGRAPHY AOBIFEM WITHOUT AND WITH CONTRAST TECHNIQUE: Multidetector CT imaging of the abdomen, pelvis and lower extremities was performed using the standard protocol during bolus administration of intravenous contrast. Multiplanar CT image reconstructions and MIPs were obtained to evaluate the vascular anatomy. RADIATION DOSE REDUCTION: This exam was performed according to the departmental dose-optimization program which includes automated exposure control, adjustment of the mA and/or kV according to patient size and/or use of iterative reconstruction technique. CONTRAST:  OMNIPAQUE  IOHEXOL  350 MG/ML SOLN COMPARISON:  CT abdomen and pelvis dated 03/25/2023, 01/10/2023 FINDINGS: VASCULAR Aorta: Aortic atherosclerosis. Normal caliber aorta without aneurysm, dissection, vasculitis or significant stenosis. Celiac: Segmental mild narrowing of the celiac artery origin and branch vessels due to atherosclerotic plaque. Conventional branching pattern. SMA: Segmental mild-to-moderate narrowing of the proximal SMA due to atherosclerotic plaque. No evidence of aneurysm, dissection, or vasculitis. Renals: Single renal arteries. Both renal arteries are patent without evidence of aneurysm, dissection, vasculitis, fibromuscular dysplasia or significant stenosis. IMA: Patent without evidence of aneurysm, dissection, vasculitis or significant stenosis. RIGHT Lower Extremity Inflow: Segmental mild-to-moderate narrowing of the common and internal iliac arteries due to atherosclerotic plaque. The external iliac artery is patent without evidence of aneurysm, dissection, vasculitis or significant stenosis. Outflow: Common, superficial and  profunda femoral arteries and the popliteal artery are patent without evidence of dissection vasculitis or significant stenosis. Small focal outpouching arising from the lateral aspect of the distal popliteal artery may reflect a small saccular aneurysm (11:170). Runoff: Moderate luminal narrowing of the tibioperoneal trunk due to calcified atherosclerotic plaque (11:186). Otherwise patent three vessel runoff to the ankle. Segmental calcified plaque involving the posterior tibial artery. LEFT Lower Extremity Inflow: Common, internal and external iliac arteries are patent without evidence of aneurysm, dissection, vasculitis or significant stenosis. Outflow: Common, superficial and profunda femoral arteries and the popliteal artery are patent without evidence of aneurysm, dissection, vasculitis or significant stenosis. Runoff: Patent 2 vessel runoff to the ankle. The peroneal artery is markedly diminutive at the level of the ankle and patency is suboptimally evaluated. Veins: No obvious venous abnormality within the limitations of this arterial phase study. Review of the MIP images confirms the above findings. NON-VASCULAR Lower chest: No focal consolidation or pulmonary nodule in the lung bases. No pleural effusion or pneumothorax demonstrated. Partially imaged heart size is normal. Hepatobiliary: No focal hepatic lesions. No intra or extrahepatic biliary ductal dilation. Normal gallbladder. Pancreas: No focal lesions or main ductal dilation. Spleen: Normal in size without focal abnormality. Adrenals/Urinary Tract: Unchanged 1.4 cm left adrenal nodule measures 30 HU (5:65). No right adrenal nodule. Punctate nonobstructing right interpolar stone. No hydronephrosis. No suspicious renal masses. No focal bladder wall thickening. Stomach/Bowel: Normal appearance of the stomach. No evidence of bowel wall thickening, distention, or inflammatory changes. Colonic diverticulosis without acute diverticulitis. Normal appendix.  Lymphatic: No enlarged abdominal or pelvic lymph nodes. Reproductive: Left adnexal simple cyst measures 4.7 x 4.2 cm (5:160), not substantially changed compared to 01/10/2023. Other: No free fluid, fluid collection, or free air. Musculoskeletal: No acute or abnormal lytic or blastic osseous lesions. Multilevel degenerative changes of the partially imaged thoracic and lumbar spine. Moderate fat containing paraumbilical hernia. Small fat containing left inguinal hernia. IMPRESSION: 1. No acute vascular abnormality. 2. Small  focal outpouching arising from the lateral aspect of the distal right popliteal artery may reflect a small saccular aneurysm. 3. Moderate luminal narrowing of the right tibioperoneal trunk due to calcified atherosclerotic plaque. Otherwise patent three vessel runoff to the right ankle. 4. Patent 2 vessel runoff to the left ankle. The peroneal artery is markedly diminutive at the level of the ankle and patency is suboptimally evaluated. 5. Unchanged 1.4 cm left adrenal nodule dating back to 01/10/2023. A nonemergent adrenal protocol CT or MRI could be considered for definitive characterization. 6. Left adnexal simple cyst measures 4.7 x 4.2 cm, not substantially changed compared to 01/10/2023. Recommend follow-up pelvic ultrasound in 1 year. 7.  Aortic Atherosclerosis (ICD10-I70.0). Electronically Signed   By: Limin  Xu M.D.   On: 07/27/2023 12:37      Assessment/Plan 1. No evidence of acute or chronic Vascular pathology based upon exam or CTA that would be an etiology of the patients symptoms or presentation. 2. Possible Neurogenic etiology with history of numbness and tingling in bilateral feet, history of L3-4; L4-L5 laminectomy and falls No Vascular surgery intervention recommended. No indication for anticoagulation. Discussed at bedside with patient and son.   Tisa Curry LABOR, MD  07/27/2023 12:58 PM    This note was created with Dragon medical transcription system.  Any error  is purely unintentional

## 2023-07-27 NOTE — ED Provider Notes (Signed)
 [facility] Provider Note    Event Date/Time   First MD Initiated Contact with Patient 07/27/23 1108     (approximate)  History   Chief Complaint: Leg Swelling  HPI  BRIT CARBONELL is a 73 y.o. female with a past medical history of anxiety, COPD, gastric reflux, hypertension, presents to the emergency department for left foot pain with mild swelling.  According to the patient for the last 2 months or so she has been noticing decreased sensation to the left foot and at times more of a sharp pain to the left foot.  She states over the last week or so she has also noticed a purple or red color to her foot with increased pain at times and slight swelling.  Patient denies any chest pain or shortness of breath.  Physical Exam   Triage Vital Signs: ED Triage Vitals  Encounter Vitals Group     BP 07/27/23 1020 (!) 143/74     Girls Systolic BP Percentile --      Girls Diastolic BP Percentile --      Boys Systolic BP Percentile --      Boys Diastolic BP Percentile --      Pulse Rate 07/27/23 1020 94     Resp 07/27/23 1020 16     Temp 07/27/23 1020 98 F (36.7 C)     Temp Source 07/27/23 1020 Oral     SpO2 07/27/23 1020 94 %     Weight 07/27/23 1021 180 lb (81.6 kg)     Height 07/27/23 1021 5' 3.5 (1.613 m)     Head Circumference --      Peak Flow --      Pain Score 07/27/23 1021 9     Pain Loc --      Pain Education --      Exclude from Growth Chart --     Most recent vital signs: Vitals:   07/27/23 1020  BP: (!) 143/74  Pulse: 94  Resp: 16  Temp: 98 F (36.7 C)  SpO2: 94%    General: Awake, no distress.  CV:  Good peripheral perfusion.  Regular rate and rhythm  Resp:  Normal effort.  Equal breath sounds bilaterally.  Abd:  No distention.   Other:  Patient has a purpleish/dusky appearance to the distal foot on the left side with prolonged cap refill proximately 10 seconds and cool to the touch.  Unable to palpate a pulse however a strong posterior  tibial pulse on Doppler with a slight dorsalis pedis pulse on Doppler.   ED Results / Procedures / Treatments   RADIOLOGY  CT scan has showed no significant arterial occlusion. Ultrasound negative for DVT  MEDICATIONS ORDERED IN ED: Medications - No data to display   IMPRESSION / MDM / ASSESSMENT AND PLAN / ED COURSE  I reviewed the triage vital signs and the nursing notes.  Patient's presentation is most consistent with acute presentation with potential threat to life or bodily function.  Patient presents to the emergency department for worsening left foot pain now with discoloration.  Patient does have a minimal dopplerable pulse on the dorsalis pedis with a strong posterior tibial pulse.  Patient's distal foot is cool to the touch with discoloration and prolonged cap refill.  Clinical picture is concerning for ischemia, although reassuring to have some dorsalis pedis pulse as well as a strong posterior tibial pulse.  Given the patient's clinical findings I spoke to Dr. Tisa of vascular  surgery who recommends obtaining a CTA runoff starting the patient on heparin  and admitting to the hospital for further workup and treatment.  I spoke to the patient regarding this plan she is agreeable as well.  Will obtain a CT runoff as well as a left lower extremity DVT study.  Will start the patient on heparin  and admit to the hospitalist once the results are known.  Patient CBC is reassuring with a normal white blood cell count.  Chemistry is reassuring with good renal function.  Patient CT scan shows no significant arterial occlusion.  Dr. Tisa of vascular surgery believes the patient is safe to go home from her standpoint.  Believes the patient symptoms could very well be more related to neuropathy and believes the patient would benefit from a neurosurgery follow-up.  Will refer to Dr. Claudene of neurosurgery.  Patient states she has taken gabapentin  in the past and had a bad response to the medication.   Will prescribe a short course of Norco for the patient to use until she can see neurosurgery.  Patient and son agreeable.  I did recommend the patient follow-up with her PCP regarding her left adnexal cyst for repeat ultrasound in 1 year      FINAL CLINICAL IMPRESSION(S) / ED DIAGNOSES   Left foot pain Neuropathy  Note:  This document was prepared using Dragon voice recognition software and may include unintentional dictation errors.   Dorothyann Drivers, MD 07/27/23 1431

## 2023-07-27 NOTE — ED Triage Notes (Addendum)
 C/o left foot numbness, cold to touch, and tingling for days. Reports the right foot is starting to feel the same. Also reports some swelling in legs.  Bilateral lower extremities purple, cold and blanchable.   C/o lower back pain and right thigh pain. Denies injury

## 2023-08-07 NOTE — Progress Notes (Deleted)
 Referring Physician:  Antonette Angeline ORN, NP [address] [place],  [place] [address]  Primary Physician:  Antonette Angeline ORN, NP  History of Present Illness: 08/07/2023*** [name] has a history of HTN, PVD with intermittent claudication, COPD, GERD, DM, hyperlipidemia, hypothyroidism, psoriasis, panic disorder, obesity.   She is s/p L3-4 lami, L4-5 lami and discectomy of the left side and L5-S1 lami on 03/02/23 by Dr. Claudene.   Last seen by Lyle on 03/13/23 and she was doing well.   Duration: *** Location: *** Quality: *** Severity: ***  Precipitating: aggravated by *** Modifying factors: made better by *** Weakness: none Timing: ***  She does not smoke.   Bowel/Bladder Dysfunction: none  Conservative measures:  Physical therapy: ***  Multimodal medical therapy including regular antiinflammatories: tylenol , norco,   Injections: *** epidural steroid injections  Past Surgery:  L3-4 lami, L4-5 lami and discectomy of the left side and L5-S1 lami on 03/02/23 by Dr. Claudene Lumbar surgery 6 years ago  [name] has ***no symptoms of cervical myelopathy.  The symptoms are causing a significant impact on the patient's life.   Review of Systems:  A 10 point review of systems is negative, except for the pertinent positives and negatives detailed in the HPI.  Past Medical History: Past Medical History:  Diagnosis Date   Anxiety    Arthritis    Chronic back pain    COPD (chronic obstructive pulmonary disease) (HCC)    Depression    GERD (gastroesophageal reflux disease)    Hypertension    Hypothyroidism    Neuromuscular disorder (HCC)    Pneumonia    RECENT X 3   PONV (postoperative nausea and vomiting)    Psoriasis    Thyroid  disease     Past Surgical History: Past Surgical History:  Procedure Laterality Date   ABDOMINAL HYSTERECTOMY     heavy bleeding   bladder stimulator     CARPAL TUNNEL RELEASE     CATARACT EXTRACTION W/PHACO Right 03/07/2015   Procedure:  CATARACT EXTRACTION PHACO AND INTRAOCULAR LENS PLACEMENT (IOC);  Surgeon: Steven Dingeldein, MD;  Location: [facility];  Service: Ophthalmology;  Laterality: Right;  US  01:14 AP% 26.4 CDE 33.04 fluid pack lot # [id number]   CATARACT EXTRACTION W/PHACO Left 03/21/2015   Procedure: CATARACT EXTRACTION PHACO AND INTRAOCULAR LENS PLACEMENT (IOC);  Surgeon: Steven Dingeldein, MD;  Location: [facility];  Service: Ophthalmology;  Laterality: Left;  US  01:21 AP% 25.6 CDE 39.78 fluid pack lot # [id number]   HAND SURGERY Right    INTERSTIM IMPLANT REMOVAL N/A 09/07/2015   Procedure: REMOVAL OF RENNA IMPLANT;  Surgeon: Glendia Elizabeth, MD;  Location: [facility];  Service: Urology;  Laterality: N/A;   LUMBAR LAMINECTOMY/DECOMPRESSION MICRODISCECTOMY N/A 02/28/2023   Procedure: L3-4 LAMINECTOMY, L4-5 LAMINECTOMY AND DISCECTOMY OF LEFT SIDE, L5-S1 LAMINECTOMY;  Surgeon: Claudene Penne ORN, MD;  Location: [facility];  Service: Neurosurgery;  Laterality: N/A;   OOPHORECTOMY Right    ovarian cyst    Allergies: Allergies as of 08/08/2023 - Review Complete 07/27/2023  Allergen Reaction Noted   Ciprofloxacin Hives and Diarrhea 06/23/2014   Gabapentin  Other (See Comments) 02/04/2023   Trazodone  and nefazodone Other (See Comments) 12/04/2020   Trazodone  Other (See Comments) 03/23/2023   Mucinex  [guaifenesin  er] Nausea Only 12/04/2020   Oxycodone  Diarrhea 06/23/2014   Sulfa antibiotics Rash 07/01/2015    Medications: Outpatient Encounter Medications as of 08/08/2023  Medication Sig   acetaminophen  (TYLENOL ) 500 MG tablet Take 500 mg by  mouth every 6 (six) hours as needed for moderate pain.    albuterol  (VENTOLIN  HFA) 108 (90 Base) MCG/ACT inhaler INHALE 2 PUFFS BY MOUTH EVERY 6 HOURS AS NEEDED FOR WHEEZING OR SHORTNESS OF BREATH   aspirin  EC (ASPIRIN  LOW DOSE) 81 MG tablet TAKE 1 TABLET(81 MG) BY MOUTH DAILY. SWALLOW WHOLE (Patient not taking: No sig reported)   benzonatate  (TESSALON ) 100 MG capsule Take 1 capsule  (100 mg total) by mouth 3 (three) times daily as needed for cough. (Patient not taking: Reported on 07/09/2023)   citalopram  (CELEXA ) 20 MG tablet Take 1 tablet (20 mg total) by mouth daily.   clonazePAM  (KLONOPIN ) 0.5 MG tablet TAKE 1 TABLET BY MOUTH EVERY MORNING AND EVERY AFTERNOON AND EVERY EVENING   diltiazem  (CARDIZEM  LA) 120 MG 24 hr tablet Take 1 tablet (120 mg total) by mouth daily.   fenofibrate  (TRICOR ) 145 MG tablet Take 1 tablet (145 mg total) by mouth daily.   fluticasone  furoate-vilanterol (BREO ELLIPTA ) 200-25 MCG/ACT AEPB Inhale 1 puff into the lungs daily.   HYDROcodone -acetaminophen  (NORCO/VICODIN) 5-325 MG tablet Take 1 tablet by mouth every 4 (four) hours as needed.   levothyroxine  (SYNTHROID ) 112 MCG tablet Take 1 tablet (112 mcg total) by mouth daily.   metFORMIN  (GLUCOPHAGE -XR) 500 MG 24 hr tablet Take 1 tablet (500 mg total) by mouth daily with breakfast.   Misc. Devices (QUAD CANE/SMALL BASE) MISC 1 Device by Does not apply route daily.   OXYGEN  Inhale 2 L/min into the lungs as needed.   pantoprazole  (PROTONIX ) 40 MG tablet SMARTSIG:1 Tablet(s) By Mouth Every Evening   rosuvastatin  (CRESTOR ) 20 MG tablet Take 1 tablet (20 mg total) by mouth daily.   senna (SENOKOT) 8.6 MG TABS tablet Take 1 tablet (8.6 mg total) by mouth 2 (two) times daily as needed for mild constipation.   triamcinolone  ointment (KENALOG ) 0.1 % Apply 1 Application topically as needed.   No facility-administered encounter medications on file as of 08/08/2023.    Social History: Social History   Tobacco Use   Smoking status: Former    Current packs/day: 0.00    Average packs/day: 1 pack/day for 35.0 years (35.0 ttl pk-yrs)    Types: Cigarettes    Start date: 04/19/2018    Quit date: 11/16/2020    Years since quitting: 2.7   Smokeless tobacco: Never   Tobacco comments:    Previously smoked 2 ppd.   Vaping Use   Vaping status: Never Used  Substance Use Topics   Alcohol  use: No   Drug use: No     Family Medical History: Family History  Problem Relation Age of Onset   Heart disease Mother    Breast cancer Mother    Heart attack Mother    COPD Father    Stroke Paternal Uncle    Kidney disease Sister    Breast cancer Cousin    Breast cancer Cousin    Heart attack Maternal Aunt    Stroke Paternal Grandfather    Kidney cancer Neg Hx    Bladder Cancer Neg Hx     Physical Examination: There were no vitals filed for this visit.  General: Patient is well developed, well nourished, calm, collected, and in no apparent distress. Attention to examination is appropriate.  Respiratory: Patient is breathing without any difficulty.   NEUROLOGICAL:     Awake, alert, oriented to person, place, and time.  Speech is clear and fluent. Fund of knowledge is appropriate.   Cranial Nerves: Pupils equal round  and reactive to light.  Facial tone is symmetric.    *** ROM of cervical spine *** pain *** posterior cervical tenderness. *** tenderness in bilateral trapezial region.   *** ROM of lumbar spine *** pain *** posterior lumbar tenderness.   No abnormal lesions on exposed skin.   Strength: Side Biceps Triceps Deltoid Interossei Grip Wrist Ext. Wrist Flex.  R 5 5 5 5 5 5 5   L 5 5 5 5 5 5 5    Side Iliopsoas Quads Hamstring PF DF EHL  R 5 5 5 5 5 5   L 5 5 5 5 5 5    Reflexes are ***2+ and symmetric at the biceps, brachioradialis, patella and achilles.   Hoffman's is absent.  Clonus is not present.   Bilateral upper and lower extremity sensation is intact to light touch.     Gait is normal.   ***No difficulty with tandem gait.    Medical Decision Making  Imaging: Nothing recent.    Assessment and Plan: Ms. Vardaman is a pleasant 73 y.o. female has ***  Treatment options discussed with patient and following plan made:   - Order for physical therapy for *** spine ***. Patient to call to schedule appointment. *** - Continue current medications including ***. Reviewed  dosing and side effects.  - Prescription for ***. Reviewed dosing and side effects. Take with food.  - Prescription for *** to take prn muscle spasms. Reviewed dosing and side effects. Discussed this can cause drowsiness.  - MRI of *** to further evaluate *** radiculopathy. No improvement time or medications (***).  - Referral to PMR at [facility] to discuss possible *** injections.  - Will schedule phone visit to review MRI results once I get them back.   I spent a total of *** minutes in face-to-face and non-face-to-face activities related to this patient's care today including review of outside records, review of imaging, review of symptoms, physical exam, discussion of differential diagnosis, discussion of treatment options, and documentation.   Thank you for involving me in the care of this patient.   Glade Boys PA-C Dept. of Neurosurgery

## 2023-08-08 ENCOUNTER — Telehealth: Payer: Self-pay | Admitting: Neurosurgery

## 2023-08-08 ENCOUNTER — Telehealth: Payer: Self-pay | Admitting: Internal Medicine

## 2023-08-08 ENCOUNTER — Encounter: Payer: Self-pay | Admitting: Neurology

## 2023-08-08 ENCOUNTER — Ambulatory Visit: Admitting: Orthopedic Surgery

## 2023-08-08 ENCOUNTER — Ambulatory Visit
Admission: RE | Admit: 2023-08-08 | Discharge: 2023-08-08 | Disposition: A | Discharge status: Home / Self Care | Attending: Neurosurgery | Admitting: Neurosurgery

## 2023-08-08 ENCOUNTER — Ambulatory Visit: Admitting: Neurosurgery

## 2023-08-08 ENCOUNTER — Ambulatory Visit
Admission: RE | Admit: 2023-08-08 | Discharge: 2023-08-08 | Disposition: A | Discharge status: Home / Self Care | Source: Ambulatory Visit | Attending: Neurosurgery | Admitting: Neurosurgery

## 2023-08-08 VITALS — BP 142/75 | Ht 63.5 in | Wt 195.0 lb

## 2023-08-08 DIAGNOSIS — R292 Abnormal reflex: Secondary | ICD-10-CM

## 2023-08-08 DIAGNOSIS — R202 Paresthesia of skin: Secondary | ICD-10-CM

## 2023-08-08 DIAGNOSIS — R2689 Other abnormalities of gait and mobility: Secondary | ICD-10-CM | POA: Diagnosis not present

## 2023-08-08 DIAGNOSIS — M50321 Other cervical disc degeneration at C4-C5 level: Secondary | ICD-10-CM | POA: Diagnosis not present

## 2023-08-08 DIAGNOSIS — R200 Anesthesia of skin: Secondary | ICD-10-CM | POA: Diagnosis not present

## 2023-08-08 DIAGNOSIS — M5136 Other intervertebral disc degeneration, lumbar region with discogenic back pain only: Secondary | ICD-10-CM | POA: Diagnosis not present

## 2023-08-08 DIAGNOSIS — M5126 Other intervertebral disc displacement, lumbar region: Secondary | ICD-10-CM | POA: Diagnosis not present

## 2023-08-08 DIAGNOSIS — I700 Atherosclerosis of aorta: Secondary | ICD-10-CM | POA: Diagnosis not present

## 2023-08-08 NOTE — Telephone Encounter (Signed)
Patient advised and will contact her PCP.

## 2023-08-08 NOTE — Telephone Encounter (Signed)
 Copied from CRM [id number]. Topic: Clinical - Medication Refill >> Aug 08, 2023  3:07 PM Zebedee SAUNDERS wrote: Medication: HYDROcodone -acetaminophen  (NORCO/VICODIN) 5-325 MG tablet  Has the patient contacted their pharmacy? Yes (Agent: If no, request that the patient contact the pharmacy for the refill. If patient does not wish to contact the pharmacy document the reason why and proceed with request.) (Agent: If yes, when and what did the pharmacy advise?)Pharmacy need PCP approval.   This is the patient's preferred pharmacy:  [id number] [place], [place] - [address] AT [facility] OF [address] [place] [address] Phone: [phone] Fax: [phone]  Is this the correct pharmacy for this prescription? Yes If no, delete pharmacy and type the correct one.   Has the prescription been filled recently? Yes  Is the patient out of the medication? Yes  Has the patient been seen for an appointment in the last year OR does the patient have an upcoming appointment? Yes  Can we respond through MyChart? No  Agent: Please be advised that Rx refills may take up to 3 business days. We ask that you follow-up with your pharmacy.

## 2023-08-08 NOTE — Telephone Encounter (Signed)
 Patient called and states she forgot to ask Dr. Claudene for something for her pain at her appointment this morning. She states that her pain is 10/10 and Tylenol  and Ibuprofen  are not touching her pain. Please advise.

## 2023-08-08 NOTE — Progress Notes (Signed)
 Referring Physician:  Antonette Angeline ORN, NP [address] [place],  [place] [address]  Primary Physician:  Antonette Angeline ORN, NP  History of Present Illness: 08/08/2023  [name] has a history of HTN, PVD with intermittent claudication, COPD, GERD, DM, hyperlipidemia, hypothyroidism, psoriasis, panic disorder, obesity.  She originally had a very good result from her lumbar laminectomies.  Had a resolution of her back and leg pain.  She has had some difficulty with follow-up but was last seen in February and was doing very well.  She has since had an exacerbation of her lower extremity issues.  She states that she has lost sensation and has noticed significant color change in her lower extremity.  She has been worked up for vascular causes.  It circumferential and nondermatomal.  Does appear to be distal greater than proximal.  She has been checked from a vascular standpoint but has not seen neurology.  She states that prior to this exacerbation of numbness and color change she was having difficulty with walking.  She was having recurrent falls.  She thought this was secondary to a medication as when she finished the medication she was no longer having falls.  She is here today to follow-up.  Past Surgery:  L3-4 lami, L4-5 lami and discectomy of the left side and L5-S1 lami on 03/02/23 by Dr. Claudene Lumbar surgery 6 years ago  The symptoms are causing a significant impact on the patient's life.   Review of Systems:  A 10 point review of systems is negative, except for the pertinent positives and negatives detailed in the HPI.  Past Medical History: Past Medical History:  Diagnosis Date   Anxiety    Arthritis    Chronic back pain    COPD (chronic obstructive pulmonary disease) (HCC)    Depression    GERD (gastroesophageal reflux disease)    Hypertension    Hypothyroidism    Neuromuscular disorder (HCC)    Pneumonia    RECENT X 3   PONV (postoperative nausea and vomiting)    Psoriasis     Thyroid  disease     Past Surgical History: Past Surgical History:  Procedure Laterality Date   ABDOMINAL HYSTERECTOMY     heavy bleeding   bladder stimulator     CARPAL TUNNEL RELEASE     CATARACT EXTRACTION W/PHACO Right 03/07/2015   Procedure: CATARACT EXTRACTION PHACO AND INTRAOCULAR LENS PLACEMENT (IOC);  Surgeon: Steven Dingeldein, MD;  Location: [facility];  Service: Ophthalmology;  Laterality: Right;  US  01:14 AP% 26.4 CDE 33.04 fluid pack lot # [id number]   CATARACT EXTRACTION W/PHACO Left 03/21/2015   Procedure: CATARACT EXTRACTION PHACO AND INTRAOCULAR LENS PLACEMENT (IOC);  Surgeon: Steven Dingeldein, MD;  Location: [facility];  Service: Ophthalmology;  Laterality: Left;  US  01:21 AP% 25.6 CDE 39.78 fluid pack lot # [id number]   HAND SURGERY Right    INTERSTIM IMPLANT REMOVAL N/A 09/07/2015   Procedure: REMOVAL OF RENNA IMPLANT;  Surgeon: Glendia Elizabeth, MD;  Location: [facility];  Service: Urology;  Laterality: N/A;   LUMBAR LAMINECTOMY/DECOMPRESSION MICRODISCECTOMY N/A 02/28/2023   Procedure: L3-4 LAMINECTOMY, L4-5 LAMINECTOMY AND DISCECTOMY OF LEFT SIDE, L5-S1 LAMINECTOMY;  Surgeon: Claudene Penne ORN, MD;  Location: [facility];  Service: Neurosurgery;  Laterality: N/A;   OOPHORECTOMY Right    ovarian cyst    Allergies: Allergies as of 08/08/2023 - Review Complete 07/27/2023  Allergen Reaction Noted   Ciprofloxacin Hives and Diarrhea 06/23/2014   Gabapentin  Other (See Comments) 02/04/2023  Trazodone  and nefazodone Other (See Comments) 12/04/2020   Trazodone  Other (See Comments) 03/23/2023   Mucinex  [guaifenesin  er] Nausea Only 12/04/2020   Oxycodone  Diarrhea 06/23/2014   Sulfa antibiotics Rash 07/01/2015    Medications: Outpatient Encounter Medications as of 08/08/2023  Medication Sig   acetaminophen  (TYLENOL ) 500 MG tablet Take 500 mg by mouth every 6 (six) hours as needed for moderate pain.    albuterol  (VENTOLIN  HFA) 108 (90 Base) MCG/ACT inhaler INHALE 2 PUFFS BY  MOUTH EVERY 6 HOURS AS NEEDED FOR WHEEZING OR SHORTNESS OF BREATH   citalopram  (CELEXA ) 20 MG tablet Take 1 tablet (20 mg total) by mouth daily.   clonazePAM  (KLONOPIN ) 0.5 MG tablet TAKE 1 TABLET BY MOUTH EVERY MORNING AND EVERY AFTERNOON AND EVERY EVENING   diltiazem  (CARDIZEM  LA) 120 MG 24 hr tablet Take 1 tablet (120 mg total) by mouth daily.   fenofibrate  (TRICOR ) 145 MG tablet Take 1 tablet (145 mg total) by mouth daily.   fluticasone  furoate-vilanterol (BREO ELLIPTA ) 200-25 MCG/ACT AEPB Inhale 1 puff into the lungs daily.   HYDROcodone -acetaminophen  (NORCO/VICODIN) 5-325 MG tablet Take 1 tablet by mouth every 4 (four) hours as needed.   levothyroxine  (SYNTHROID ) 112 MCG tablet Take 1 tablet (112 mcg total) by mouth daily.   metFORMIN  (GLUCOPHAGE -XR) 500 MG 24 hr tablet Take 1 tablet (500 mg total) by mouth daily with breakfast.   Misc. Devices (QUAD CANE/SMALL BASE) MISC 1 Device by Does not apply route daily.   OXYGEN  Inhale 2 L/min into the lungs as needed.   triamcinolone  ointment (KENALOG ) 0.1 % Apply 1 Application topically as needed.   [DISCONTINUED] aspirin  EC (ASPIRIN  LOW DOSE) 81 MG tablet TAKE 1 TABLET(81 MG) BY MOUTH DAILY. SWALLOW WHOLE (Patient not taking: No sig reported)   [DISCONTINUED] benzonatate  (TESSALON ) 100 MG capsule Take 1 capsule (100 mg total) by mouth 3 (three) times daily as needed for cough. (Patient not taking: Reported on 07/09/2023)   [DISCONTINUED] pantoprazole  (PROTONIX ) 40 MG tablet SMARTSIG:1 Tablet(s) By Mouth Every Evening   [DISCONTINUED] rosuvastatin  (CRESTOR ) 20 MG tablet Take 1 tablet (20 mg total) by mouth daily.   [DISCONTINUED] senna (SENOKOT) 8.6 MG TABS tablet Take 1 tablet (8.6 mg total) by mouth 2 (two) times daily as needed for mild constipation.   No facility-administered encounter medications on file as of 08/08/2023.    Social History: Social History   Tobacco Use   Smoking status: Former    Current packs/day: 0.00    Average  packs/day: 1 pack/day for 35.0 years (35.0 ttl pk-yrs)    Types: Cigarettes    Start date: 04/19/2018    Quit date: 11/16/2020    Years since quitting: 2.7   Smokeless tobacco: Never   Tobacco comments:    Previously smoked 2 ppd.   Vaping Use   Vaping status: Never Used  Substance Use Topics   Alcohol  use: No   Drug use: No    Family Medical History: Family History  Problem Relation Age of Onset   Heart disease Mother    Breast cancer Mother    Heart attack Mother    COPD Father    Stroke Paternal Uncle    Kidney disease Sister    Breast cancer Cousin    Breast cancer Cousin    Heart attack Maternal Aunt    Stroke Paternal Grandfather    Kidney cancer Neg Hx    Bladder Cancer Neg Hx     Physical Examination: Vitals:   08/08/23 1151  BP: ROLLEN)  142/75    General: Patient is well developed, well nourished, calm, collected, and in no apparent distress. Attention to examination is appropriate.  Respiratory: Patient is breathing without any difficulty.   NEUROLOGICAL:  Her left lower extremity shows significant discoloration of her distal lower extremity that fades as it comes proximally.  She has decree sensation to mid shin this is circumferential and nondermatomal.  She is able to flex and extend with decreased range of motion in her toes ankle knee and hip.  She has significantly decreased reflexes at the Achilles bilaterally.  She has hyperreflexia noted in the bilateral patellas with possible crossed adductor noted on the left.  Her upper extremity shows a possible Tromner on the left, with some spreading of her reflexes at the brachial radialis.  Medical Decision Making  Imaging: Nothing recent.    Assessment and Plan: Ms. Shrestha is a pleasant 73 y.o. female has history of multilevel lumbar spondylosis status post decompression.  She initially did very well from surgery had a complete resolution of her pain and had an improvement in her weakness.  She was doing  well until she had a episode where she was starting to have worsening ambulatory status.  She was having increased falls and unsteadiness.  She states that at the same time her left lower extremity started to become discolored and started to lose sensation.  She was seen and evaluated sent to vascular for evaluation which did not show on any evidence of vascular claudication.  She has been worked up from that standpoint was sent to neurosurgery for evaluation for a neurologic cause.  In regards to her presentation it would be somewhat atypical for the discoloration and circumferential numbness of the left lower extremity to be coming from a radicular cause disease generally will follow dermatomes rather than be circumferential and sock like.  I do however know that with her history of lumbar surgery and spondylosis I would like to get a flexion-extension x-ray to make sure she does not have any spondylolisthesis mobile or otherwise.  I like to get a repeat lumbar MRI to evaluate for any ongoing compression or worsening or new compression of exiting or traversing nerve roots.  In regards to the rest of her symptoms, as she started with worsening falls and unsteadiness on her feet with sock like sensation loss and some fingertip sensation issues there is some concern for possible myelopathic process in the cervical spine.  For this we will plan on getting cervical x-rays as well as cervical MRI to evaluate for any compressive etiology that could be causing a hemicord type symptom.  Alternatively she could be developing neuropathy as this is more common in the sock like distribution that she is presenting with.  Other possible etiologies for the discoloration swelling pain and neuropathic changes could be something like complex regional pain syndrome in the left lower extremity.  I am going to refer her to neurology for workup for possible peripheral neuropathy.  Once I have her imaging we will decide on  whether or not we send her to pain for possible complex regional pain syndrome.    Penne MICAEL Sharps, MD dept. of Neurosurgery

## 2023-08-12 NOTE — Telephone Encounter (Signed)
 Requested medications are due for refill today.  yes  Requested medications are on the active medications list.  yes  Last refill. 07/27/2023 #15 0 rf  Future visit scheduled.   no  Notes to clinic.  Refill not delegated.    Requested Prescriptions  Pending Prescriptions Disp Refills   HYDROcodone -acetaminophen  (NORCO/VICODIN) 5-325 MG tablet 15 tablet 0    Sig: Take 1 tablet by mouth every 4 (four) hours as needed.     Not Delegated - Analgesics:  Opioid Agonist Combinations Failed - 08/12/2023  5:49 PM      Failed - This refill cannot be delegated      Failed - Urine Drug Screen completed in last 360 days      Passed - Valid encounter within last 3 months    Recent Outpatient Visits           2 months ago Type 2 diabetes mellitus without complication, without long-term current use of insulin  [facility])   [place] [facility] [place], [place] W, NP   3 months ago Acute cystitis without hematuria   [place] [facility] [place], Angeline ORN, NP       Future Appointments             In 2 months Leigh, Venetia CROME, MD [facility] Neurology

## 2023-08-16 ENCOUNTER — Ambulatory Visit
Admission: RE | Admit: 2023-08-16 | Discharge: 2023-08-16 | Disposition: A | Discharge status: Home / Self Care | Source: Ambulatory Visit | Attending: Neurosurgery | Admitting: Neurosurgery

## 2023-08-16 DIAGNOSIS — R200 Anesthesia of skin: Secondary | ICD-10-CM | POA: Insufficient documentation

## 2023-08-16 DIAGNOSIS — R202 Paresthesia of skin: Secondary | ICD-10-CM | POA: Insufficient documentation

## 2023-08-16 DIAGNOSIS — R292 Abnormal reflex: Secondary | ICD-10-CM | POA: Insufficient documentation

## 2023-08-16 DIAGNOSIS — M4802 Spinal stenosis, cervical region: Secondary | ICD-10-CM | POA: Diagnosis not present

## 2023-08-16 DIAGNOSIS — M5126 Other intervertebral disc displacement, lumbar region: Secondary | ICD-10-CM | POA: Diagnosis not present

## 2023-08-16 DIAGNOSIS — M47812 Spondylosis without myelopathy or radiculopathy, cervical region: Secondary | ICD-10-CM | POA: Diagnosis not present

## 2023-08-16 DIAGNOSIS — M4807 Spinal stenosis, lumbosacral region: Secondary | ICD-10-CM | POA: Diagnosis not present

## 2023-08-16 DIAGNOSIS — M4319 Spondylolisthesis, multiple sites in spine: Secondary | ICD-10-CM | POA: Diagnosis not present

## 2023-08-16 DIAGNOSIS — M5021 Other cervical disc displacement,  high cervical region: Secondary | ICD-10-CM | POA: Diagnosis not present

## 2023-08-16 DIAGNOSIS — M5136 Other intervertebral disc degeneration, lumbar region with discogenic back pain only: Secondary | ICD-10-CM | POA: Diagnosis not present

## 2023-08-16 DIAGNOSIS — M542 Cervicalgia: Secondary | ICD-10-CM | POA: Diagnosis not present

## 2023-08-17 DIAGNOSIS — J449 Chronic obstructive pulmonary disease, unspecified: Secondary | ICD-10-CM | POA: Diagnosis not present

## 2023-08-17 DIAGNOSIS — J9610 Chronic respiratory failure, unspecified whether with hypoxia or hypercapnia: Secondary | ICD-10-CM | POA: Diagnosis not present

## 2023-08-19 ENCOUNTER — Ambulatory Visit: Admitting: Neurosurgery

## 2023-08-19 ENCOUNTER — Other Ambulatory Visit: Payer: Self-pay | Admitting: Internal Medicine

## 2023-08-19 DIAGNOSIS — Z9889 Other specified postprocedural states: Secondary | ICD-10-CM

## 2023-08-19 DIAGNOSIS — M48061 Spinal stenosis, lumbar region without neurogenic claudication: Secondary | ICD-10-CM | POA: Diagnosis not present

## 2023-08-19 DIAGNOSIS — R202 Paresthesia of skin: Secondary | ICD-10-CM | POA: Diagnosis not present

## 2023-08-19 DIAGNOSIS — M5416 Radiculopathy, lumbar region: Secondary | ICD-10-CM | POA: Diagnosis not present

## 2023-08-19 DIAGNOSIS — R200 Anesthesia of skin: Secondary | ICD-10-CM | POA: Diagnosis not present

## 2023-08-19 NOTE — Progress Notes (Signed)
 I had a phone visit with [name] today.  She was at home and I was in the office.  She gave consent to go forward to discuss her imaging findings.  When I last saw her she was having a significant exacerbation of her left lower extremity symptoms.  She noticed that this happened after a fall.  She was originally doing very well and had improved function, however unfortunately she developed a instability and had a significant fall which exacerbated her pain severely.  She has now noticed color changes in her lower extremity as well as swelling and other changes.  She has known spondylosis and a calcified disc fragment however did have good posterior decompression.  Narrative & Impression  EXAM: MRI LUMBAR SPINE 08/16/2023 11:11:00 AM   TECHNIQUE: Multiplanar multisequence MRI of the lumbar spine was performed without the administration of intravenous contrast.   COMPARISON: MRI of the lumbar spine 01/07/2023.   CLINICAL HISTORY: Worsening lower extremity sensation loss, history of prior lumbar surgery in March. Since then, the patient has had multiple falls with continuous low back pain, right leg/hip pain, neck/shoulder pain, and finger cramping.   FINDINGS:   BONES AND ALIGNMENT: Slight retrolisthesis at L1-2, L2-3, and L3-4, stable. Rapid curvature centered at L3 is stable. Slight anterolisthesis at L5 to S1 is stable.   SPINAL CORD: The conus medullaris terminates at L2.   SOFT TISSUES: No paraspinal mass.   L1-L2: Stable mild disc bulging is present without focal stenosis or change.   L2-L3: A leftward disc protrusion is present. Mild facet hypertrophy is present. Mild central and left greater than right foraminal narrowing is stable.   L3-L4: Decompressive laminectomy is noted. Mild central canal narrowing remains. Moderate subarticular stenosis is present left greater than right. Moderate foraminal stenosis left greater than right, has progressed.    L4-L5: Decompressive laminectomy noted. Mild residual central canal stenosis is present. Moderate facet hypertrophy is present bilaterally. Moderate foraminal narrowing is stable bilaterally.   L5-S1: Laminectomy again noted. Moderate facet hypertrophy and spurring is present. No focal stenosis is present.   IMPRESSION: 1. Stable slight retrolisthesis at L1-2, L2-3, and L3-4, and slight anterolisthesis at L5-S1. 2. Stable mild disc bulging at L1-2 without focal stenosis. 3. Leftward disc protrusion at L2-3 with mild central and left greater than right foraminal narrowing, stable. 4. Post-surgical changes at L3-L4, L4-L5, and L5-S1 levels with mild to moderate residual stenosis as described above. Moderate foraminal stenosis at L3-L4 has progressed.   Electronically signed by: Lonni Necessary MD 08/18/2023 08:58 AM EDT RP Workstation: [id number]      Her imaging did show some progression of her spondylosis after decompression.  In summary, [name] is 73 year old woman with a previous lumbar laminectomy and discectomy who had a very good initial result.  She was doing very well until she had a fall which caused a severe exacerbation of her left lower extremity predominant pain.  She does have other symptoms including redness swelling and some skin changes in her left lower extremity.  I believe her presentation is consistent with a radiculitis likely secondary to her ongoing stenosis and trauma from the fall.  This likely exacerbated her radicular symptoms as she had decreased reserve given her previous severe compression.  I am curious whether or not she would benefit from evaluation by pain physician for possible spinal cord stimulation evaluation.  Some of her symptoms could be consistent with a diagnosis of CRPS.  I did discuss whether or not I  thought a repeat decompression would benefit her, however she may benefit better from a less invasive option such as  stimulation.   Spent a total of 10 minutes on this phone call today.  Penne MICAEL Sharps, MD

## 2023-08-20 ENCOUNTER — Other Ambulatory Visit: Payer: Self-pay | Admitting: Internal Medicine

## 2023-08-20 DIAGNOSIS — F410 Panic disorder [episodic paroxysmal anxiety] without agoraphobia: Secondary | ICD-10-CM

## 2023-08-20 DIAGNOSIS — I10 Essential (primary) hypertension: Secondary | ICD-10-CM

## 2023-08-20 NOTE — Telephone Encounter (Signed)
 Requested Prescriptions  Pending Prescriptions Disp Refills   fenofibrate  (TRICOR ) 145 MG tablet [Pharmacy Med Name: FENOFIBRATE  145MG  TABLETS] 90 tablet 0    Sig: TAKE 1 TABLET(145 MG) BY MOUTH DAILY     Cardiovascular:  Antilipid - Fibric Acid Derivatives Failed - 08/20/2023  2:51 PM      Failed - Lipid Panel in normal range within the last 12 months    Cholesterol, Total  Date Value Ref Range Status  02/03/2015 205 (H) 100 - 199 mg/dL Final   Cholesterol  Date Value Ref Range Status  06/07/2023 84 <200 mg/dL Final   LDL Cholesterol (Calc)  Date Value Ref Range Status  06/07/2023 28 mg/dL (calc) Final    Comment:    Reference range: <100 . Desirable range <100 mg/dL for primary prevention;   <70 mg/dL for patients with CHD or diabetic patients  with > or = 2 CHD risk factors. SABRA LDL-C is now calculated using the Martin-Hopkins  calculation, which is a validated novel method providing  better accuracy than the Friedewald equation in the  estimation of LDL-C.  [name] et al. SANDREA. 7986;689(80): 2061-2068  (http://education.[facility].com/faq/FAQ164)    HDL  Date Value Ref Range Status  06/07/2023 36 (L) > OR = 50 mg/dL Final  87/70/7983 36 (L) >39 mg/dL Final   Triglycerides  Date Value Ref Range Status  06/07/2023 117 <150 mg/dL Final         Passed - ALT in normal range and within 360 days    ALT  Date Value Ref Range Status  07/27/2023 14 0 - 44 U/L Final         Passed - AST in normal range and within 360 days    AST  Date Value Ref Range Status  07/27/2023 28 15 - 41 U/L Final         Passed - Cr in normal range and within 360 days    Creat  Date Value Ref Range Status  06/07/2023 0.85 0.60 - 1.00 mg/dL Final   Creatinine, Ser  Date Value Ref Range Status  07/27/2023 0.68 0.44 - 1.00 mg/dL Final   Creatinine, Urine  Date Value Ref Range Status  06/07/2023 119 20 - 275 mg/dL Final         Passed - HGB in normal range and within 360 days     Hemoglobin  Date Value Ref Range Status  07/27/2023 12.9 12.0 - 15.0 g/dL Final  94/80/7982 84.3 11.1 - 15.9 g/dL Final         Passed - HCT in normal range and within 360 days    HCT  Date Value Ref Range Status  07/27/2023 40.7 36.0 - 46.0 % Final   Hematocrit  Date Value Ref Range Status  06/24/2015 46.6 34.0 - 46.6 % Final         Passed - PLT in normal range and within 360 days    Platelets  Date Value Ref Range Status  07/27/2023 251 150 - 400 K/uL Final  06/24/2015 307 150 - 379 x10E3/uL Final         Passed - WBC in normal range and within 360 days    WBC  Date Value Ref Range Status  07/27/2023 7.2 4.0 - 10.5 K/uL Final         Passed - eGFR is 30 or above and within 360 days    GFR, Est African American  Date Value Ref Range Status  12/03/2019 84 > OR =  60 mL/min/1.17m2 Final   GFR, Est Non African American  Date Value Ref Range Status  12/03/2019 72 > OR = 60 mL/min/1.32m2 Final   GFR, Estimated  Date Value Ref Range Status  07/27/2023 >60 >60 mL/min Final    Comment:    (NOTE) Calculated using the CKD-EPI Creatinine Equation (2021)    eGFR  Date Value Ref Range Status  06/07/2023 72 > OR = 60 mL/min/1.20m2 Final         Passed - Valid encounter within last 12 months    Recent Outpatient Visits           2 months ago Type 2 diabetes mellitus without complication, without long-term current use of insulin  [facility])   [place] [facility] [place], [place] W, NP   3 months ago Acute cystitis without hematuria   [place] [place], Angeline ORN, NP       Future Appointments             In 2 months [place], Venetia CROME, MD [facility] Neurology            Refused Prescriptions Disp Refills   levothyroxine  (SYNTHROID ) 100 MCG tablet [Pharmacy Med Name: LEVOTHYROXINE  0.100MG  ( ) TAB] 90 tablet 1    Sig: TAKE 1 TABLET(100 MCG) BY MOUTH DAILY     Endocrinology:  Hypothyroid Agents Failed -  08/20/2023  2:51 PM      Failed - TSH in normal range and within 360 days    TSH  Date Value Ref Range Status  06/07/2023 14.08 (H) 0.40 - 4.50 mIU/L Final         Passed - Valid encounter within last 12 months    Recent Outpatient Visits           2 months ago Type 2 diabetes mellitus without complication, without long-term current use of insulin  [facility])   [place] [facility] [place], [place] W, NP   3 months ago Acute cystitis without hematuria   [facility] [place], Angeline ORN, NP       Future Appointments             In 2 months Leigh, Venetia CROME, MD [facility] Neurology

## 2023-08-21 NOTE — Telephone Encounter (Signed)
 Requested Prescriptions  Pending Prescriptions Disp Refills   clonazePAM  (KLONOPIN ) 0.5 MG tablet [Pharmacy Med Name: CLONAZEPAM  0.5MG  TABLETS] 90 tablet     Sig: TAKE 1 TABLET BY MOUTH EVERY MORNING AND EVERY AFTERNOON AND EVERY EVENING     Not Delegated - Psychiatry: Anxiolytics/Hypnotics 2 Failed - 08/21/2023  1:31 PM      Failed - This refill cannot be delegated      Failed - Urine Drug Screen completed in last 360 days      Passed - Patient is not pregnant      Passed - Valid encounter within last 6 months    Recent Outpatient Visits           2 months ago Type 2 diabetes mellitus without complication, without long-term current use of insulin  [facility])   [place] [facility] [place], [place] W, NP   3 months ago Acute cystitis without hematuria   [place] [facility] [place], Angeline ORN, NP       Future Appointments             In 2 months Hill, Venetia CROME, MD [facility] Neurology             citalopram  (CELEXA ) 20 MG tablet [Pharmacy Med Name: CITALOPRAM  20MG  TABLETS] 90 tablet 0    Sig: TAKE 1 TABLET(20 MG) BY MOUTH DAILY     Psychiatry:  Antidepressants - SSRI Passed - 08/21/2023  1:31 PM      Passed - Completed PHQ-2 or PHQ-9 in the last 360 days      Passed - Valid encounter within last 6 months    Recent Outpatient Visits           2 months ago Type 2 diabetes mellitus without complication, without long-term current use of insulin  (HCC)   [place] [facility] [place], [place] W, NP   3 months ago Acute cystitis without hematuria   [place] [facility], Angeline ORN, NP       Future Appointments             In 2 months Hill, Venetia CROME, MD [facility] Neurology            Refused Prescriptions Disp Refills   lisinopril  (ZESTRIL ) 10 MG tablet [Pharmacy Med Name: LISINOPRIL  10MG  TABLETS] 90 tablet 0    Sig: TAKE 1 TABLET(10 MG) BY MOUTH DAILY     Cardiovascular:  ACE  Inhibitors Failed - 08/21/2023  1:31 PM      Failed - Last BP in normal range    BP Readings from Last 1 Encounters:  08/08/23 (!) 142/75         Passed - Cr in normal range and within 180 days    Creat  Date Value Ref Range Status  06/07/2023 0.85 0.60 - 1.00 mg/dL Final   Creatinine, Ser  Date Value Ref Range Status  07/27/2023 0.68 0.44 - 1.00 mg/dL Final   Creatinine, Urine  Date Value Ref Range Status  06/07/2023 119 20 - 275 mg/dL Final         Passed - K in normal range and within 180 days    Potassium  Date Value Ref Range Status  07/27/2023 3.6 3.5 - 5.1 mmol/L Final  02/02/2013 3.7 3.5 - 5.1 mmol/L Final         Passed - Patient is not pregnant      Passed - Valid  encounter within last 6 months    Recent Outpatient Visits           2 months ago Type 2 diabetes mellitus without complication, without long-term current use of insulin  [facility])   [facility] [place], [place] W, NP   3 months ago Acute cystitis without hematuria   [place] [facility] [place], Angeline ORN, NP       Future Appointments             In 2 months Leigh, Venetia CROME, MD [facility] Neurology

## 2023-08-21 NOTE — Telephone Encounter (Signed)
 Requested medication (s) are due for refill today: yes  Requested medication (s) are on the active medication list: yes  Last refill:  07/25/23  Future visit scheduled: yes  Notes to clinic:  Unable to refill per protocol, cannot delegate.      Requested Prescriptions  Pending Prescriptions Disp Refills   clonazePAM  (KLONOPIN ) 0.5 MG tablet [Pharmacy Med Name: CLONAZEPAM  0.5MG  TABLETS] 90 tablet     Sig: TAKE 1 TABLET BY MOUTH EVERY MORNING AND EVERY AFTERNOON AND EVERY EVENING     Not Delegated - Psychiatry: Anxiolytics/Hypnotics 2 Failed - 08/21/2023  1:42 PM      Failed - This refill cannot be delegated      Failed - Urine Drug Screen completed in last 360 days      Passed - Patient is not pregnant      Passed - Valid encounter within last 6 months    Recent Outpatient Visits           2 months ago Type 2 diabetes mellitus without complication, without long-term current use of insulin  [facility])   [place] [facility] [place], [place] W, NP   3 months ago Acute cystitis without hematuria   [place] [facility] [place], Angeline ORN, NP       Future Appointments             In 2 months Leigh, Venetia CROME, MD [facility] Neurology            Signed Prescriptions Disp Refills   citalopram  (CELEXA ) 20 MG tablet 90 tablet 0    Sig: TAKE 1 TABLET(20 MG) BY MOUTH DAILY     Psychiatry:  Antidepressants - SSRI Passed - 08/21/2023  1:42 PM      Passed - Completed PHQ-2 or PHQ-9 in the last 360 days      Passed - Valid encounter within last 6 months    Recent Outpatient Visits           2 months ago Type 2 diabetes mellitus without complication, without long-term current use of insulin  [facility])   [place] [facility] [place], [place] W, NP   3 months ago Acute cystitis without hematuria   [place] [facility] [place], Angeline ORN, NP       Future Appointments             In 2 months Hill, Venetia CROME, MD  [facility] Neurology            Refused Prescriptions Disp Refills   lisinopril  (ZESTRIL ) 10 MG tablet [Pharmacy Med Name: LISINOPRIL  10MG  TABLETS] 90 tablet 0    Sig: TAKE 1 TABLET(10 MG) BY MOUTH DAILY     Cardiovascular:  ACE Inhibitors Failed - 08/21/2023  1:42 PM      Failed - Last BP in normal range    BP Readings from Last 1 Encounters:  08/08/23 (!) 142/75         Passed - Cr in normal range and within 180 days    Creat  Date Value Ref Range Status  06/07/2023 0.85 0.60 - 1.00 mg/dL Final   Creatinine, Ser  Date Value Ref Range Status  07/27/2023 0.68 0.44 - 1.00 mg/dL Final   Creatinine, Urine  Date Value Ref Range Status  06/07/2023 119 20 - 275 mg/dL Final         Passed - K in normal range and within 180 days  Potassium  Date Value Ref Range Status  07/27/2023 3.6 3.5 - 5.1 mmol/L Final  02/02/2013 3.7 3.5 - 5.1 mmol/L Final         Passed - Patient is not pregnant      Passed - Valid encounter within last 6 months    Recent Outpatient Visits           2 months ago Type 2 diabetes mellitus without complication, without long-term current use of insulin  [facility])   [place] [facility] [place], [place] W, NP   3 months ago Acute cystitis without hematuria   [place] [facility] [place], Angeline ORN, NP       Future Appointments             In 2 months Leigh, Venetia CROME, MD [facility] Neurology

## 2023-08-22 NOTE — Telephone Encounter (Signed)
 Requested medication (s) are due for refill today:   Provider to review  Requested medication (s) are on the active medication list:   Yes  Future visit scheduled:   Yes 10/31   LOV 06/07/2023    Last ordered: 07/25/2023 #90, 0 refills  Unable to refill because this is a non delegated medication.    Pt requesting a call from clinic.  Has questions [phone]   Requested Prescriptions  Pending Prescriptions Disp Refills   clonazePAM  (KLONOPIN ) 0.5 MG tablet [Pharmacy Med Name: CLONAZEPAM  0.5MG  TABLETS] 90 tablet     Sig: TAKE 1 TABLET BY MOUTH EVERY MORNING AND EVERY AFTERNOON AND EVERY EVENING     Not Delegated - Psychiatry: Anxiolytics/Hypnotics 2 Failed - 08/22/2023 12:26 PM      Failed - This refill cannot be delegated      Failed - Urine Drug Screen completed in last 360 days      Passed - Patient is not pregnant      Passed - Valid encounter within last 6 months    Recent Outpatient Visits           2 months ago Type 2 diabetes mellitus without complication, without long-term current use of insulin  [facility])   [place] [facility] [place], [place] W, NP   3 months ago Acute cystitis without hematuria   [place] [facility] [place], Angeline ORN, NP       Future Appointments             In 2 months Leigh, Venetia CROME, MD [facility] Neurology            Signed Prescriptions Disp Refills   citalopram  (CELEXA ) 20 MG tablet 90 tablet 0    Sig: TAKE 1 TABLET(20 MG) BY MOUTH DAILY     Psychiatry:  Antidepressants - SSRI Passed - 08/22/2023 12:26 PM      Passed - Completed PHQ-2 or PHQ-9 in the last 360 days      Passed - Valid encounter within last 6 months    Recent Outpatient Visits           2 months ago Type 2 diabetes mellitus without complication, without long-term current use of insulin  [facility])   [place] [facility] [place], [place] W, NP   3 months ago Acute cystitis without hematuria   [place] [facility] [place], Angeline ORN, NP       Future Appointments             In 2 months Leigh, Venetia CROME, MD [facility] Neurology            Refused Prescriptions Disp Refills   lisinopril  (ZESTRIL ) 10 MG tablet [Pharmacy Med Name: LISINOPRIL  10MG  TABLETS] 90 tablet 0    Sig: TAKE 1 TABLET(10 MG) BY MOUTH DAILY     Cardiovascular:  ACE Inhibitors Failed - 08/22/2023 12:26 PM      Failed - Last BP in normal range    BP Readings from Last 1 Encounters:  08/08/23 (!) 142/75         Passed - Cr in normal range and within 180 days    Creat  Date Value Ref Range Status  06/07/2023 0.85 0.60 - 1.00 mg/dL Final   Creatinine, Ser  Date Value Ref Range Status  07/27/2023 0.68 0.44 - 1.00 mg/dL Final   Creatinine, Urine  Date Value Ref Range Status  06/07/2023 119 20 -  275 mg/dL Final         Passed - K in normal range and within 180 days    Potassium  Date Value Ref Range Status  07/27/2023 3.6 3.5 - 5.1 mmol/L Final  02/02/2013 3.7 3.5 - 5.1 mmol/L Final         Passed - Patient is not pregnant      Passed - Valid encounter within last 6 months    Recent Outpatient Visits           2 months ago Type 2 diabetes mellitus without complication, without long-term current use of insulin  [facility])   [place] [facility] [place], [place] W, NP   3 months ago Acute cystitis without hematuria   [place] [facility] [place], Angeline ORN, NP       Future Appointments             In 2 months Leigh, Venetia CROME, MD [facility] Neurology

## 2023-08-22 NOTE — Telephone Encounter (Signed)
 Pt called for status update, wants a call back from the clinic. She has questions   Best contact: [phone]

## 2023-08-27 ENCOUNTER — Telehealth: Payer: Self-pay

## 2023-08-27 NOTE — Patient Instructions (Signed)
 [name] - I am sorry I was unable to reach you today for our scheduled appointment. I work with Antonette Angeline ORN, NP and am calling to support your healthcare needs. Please contact me at [phone] at your earliest convenience. I look forward to speaking with you soon.   Thank you,   Jackson Acron [facility] RN Care Manager Direct Dial: [phone]  Fax: [phone] Website: [email or web address]

## 2023-09-06 DIAGNOSIS — Z79899 Other long term (current) drug therapy: Secondary | ICD-10-CM | POA: Diagnosis not present

## 2023-09-06 DIAGNOSIS — L400 Psoriasis vulgaris: Secondary | ICD-10-CM | POA: Diagnosis not present

## 2023-09-06 DIAGNOSIS — Z111 Encounter for screening for respiratory tuberculosis: Secondary | ICD-10-CM | POA: Diagnosis not present

## 2023-09-11 ENCOUNTER — Ambulatory Visit (INDEPENDENT_AMBULATORY_CARE_PROVIDER_SITE_OTHER): Admitting: Neurosurgery

## 2023-09-11 DIAGNOSIS — M961 Postlaminectomy syndrome, not elsewhere classified: Secondary | ICD-10-CM | POA: Insufficient documentation

## 2023-09-11 DIAGNOSIS — M4726 Other spondylosis with radiculopathy, lumbar region: Secondary | ICD-10-CM

## 2023-09-11 DIAGNOSIS — M47816 Spondylosis without myelopathy or radiculopathy, lumbar region: Secondary | ICD-10-CM | POA: Insufficient documentation

## 2023-09-11 DIAGNOSIS — G894 Chronic pain syndrome: Secondary | ICD-10-CM | POA: Diagnosis not present

## 2023-09-11 DIAGNOSIS — M5416 Radiculopathy, lumbar region: Secondary | ICD-10-CM

## 2023-09-11 NOTE — Patient Instructions (Addendum)
 Thank you for following up with us  today.  As a summary we will plan on referring you over to our pain team for spinal cord stimulator evaluation.  As one of the requirements for this treatment he will need to have an evaluation by a psychologist to make sure that you meet criteria for spinal cord stimulator evaluation.  I am going to list out a group of these below.  I have placed that referral.    I have also made the referral over to the pain team.  They will be reaching out to you.  Please let us  know if you have any further questions  [facility] [place] - televisits [phone] Not in network with [facility] [place] - televisits [phone] Not in network with [facility] Medicine in [place] Provider: Neoma Pack, [place] [phone] Accepts [facility] Dr Corina in [place] [phone] Accepts [facility], but is typically booked out about 10 months Dr Juliene Belts in [place] - also does televisits [phone] Dr Achilles in [place] [phone] Dr Reyes Sella in [place] - also does televisits [phone] [facility] (offices in [place], [place], and [place]) [phone]   Please let us  know which one of the above psychologist's you would like to see for evaluation prior to the spinal cord stimulator trial. These are the only providers we are aware of that perform this type of evaluation. Once we fax the referral, please call them to set up an appointment (they do not typically call you).

## 2023-09-11 NOTE — Progress Notes (Signed)
 Had a follow-up I had a phone visit with [name].  They were at home and I was in the office.  They gave consent to go forward with the phone visit to discuss their ongoing back pain.   She having continued back pain.  She also gets pain radiating down into her legs.  She initially did very well with her decompression, however unfortunately had a fall which caused some radiculitis.  Continues to have back pain ever since.  We did review her imaging together today, I mentioned that she has severe disc degeneration as well as deformity, in order to fully address this would likely include a major spinal surgery.  She is quite hesitant about this understandably.  She asked if there were any less invasive options.  We did discuss spinal cord stimulation.  She does have chronic back pain and lower extremity pain refractory to surgery.  I will plan on setting her up for referral.  Assessment and Plan [name] is a 73 y.o.-year-old female with a history of chronic pain localized to chronic back and bilateral lower extremity pain, refractory to conservative management. Prior treatments have included decompressive lumbar surgery, preoperative and postoperative physical therapy, oral analgesics, watchful waiting, she has updated imaging including MRI and x-rays.  She was evaluated for surgery and indicated operations were deemed too high risk due to bone health and overall patient risk profile.  She has had minimal sustained relief and her symptoms continue to interfere with her sleep mobility and activities of daily living.  I have made referral for psychiatric evaluation for the spinal cord stimulator candidacy. No active substance abuse or untreated psychiatric conditions were identified on screening.  Patient's goals for care are realistic   she patient has been educated on spinal cord stimulation, including the trial process, risks, benefits, and expectations. Informed consent for referral was  obtained.  Request:   Please evaluate for spinal cord stimulator trial. Patient appears to meet criteria and is motivated to proceed. Kindly assess candidacy and proceed with trial if appropriate.  Thank you, Jill MICAEL Sharps, MD [facility] [place]  Spent a total of 20 minutes with this patient today discussing their care, coordinating their care, documentation, and direct patient interaction.

## 2023-09-17 ENCOUNTER — Other Ambulatory Visit: Payer: Self-pay | Admitting: Internal Medicine

## 2023-09-17 DIAGNOSIS — J9610 Chronic respiratory failure, unspecified whether with hypoxia or hypercapnia: Secondary | ICD-10-CM | POA: Diagnosis not present

## 2023-09-17 DIAGNOSIS — F410 Panic disorder [episodic paroxysmal anxiety] without agoraphobia: Secondary | ICD-10-CM

## 2023-09-17 DIAGNOSIS — J449 Chronic obstructive pulmonary disease, unspecified: Secondary | ICD-10-CM | POA: Diagnosis not present

## 2023-09-19 DIAGNOSIS — Z79899 Other long term (current) drug therapy: Secondary | ICD-10-CM | POA: Diagnosis not present

## 2023-09-19 NOTE — Telephone Encounter (Signed)
 Requested medication (s) are due for refill today - yes  Requested medication (s) are on the active medication list -yes  Future visit scheduled -yes  Last refill: 08/22/23 #90  Notes to clinic: non delegated Rx  Requested Prescriptions  Pending Prescriptions Disp Refills   clonazePAM  (KLONOPIN ) 0.5 MG tablet [Pharmacy Med Name: CLONAZEPAM  0.5MG  TABLETS] 90 tablet     Sig: TAKE 1 TABLET BY MOUTH EVERY MORNING AND EVERY AFTERNOON AND EVERY EVENING     Not Delegated - Psychiatry: Anxiolytics/Hypnotics 2 Failed - 09/19/2023  4:27 PM      Failed - This refill cannot be delegated      Failed - Urine Drug Screen completed in last 360 days      Passed - Patient is not pregnant      Passed - Valid encounter within last 6 months    Recent Outpatient Visits           3 months ago Type 2 diabetes mellitus without complication, without long-term current use of insulin  (HCC)   [place] [facility] [place], [place] W, NP   4 months ago Acute cystitis without hematuria   [place] [facility] [place], Angeline ORN, NP       Future Appointments             In 1 month Lib[place] Negrin Torres, Venetia CROME, MD [facility] Neurology            Signed Prescriptions Disp Refills   metFORMIN  (GLUCOPHAGE -XR) 500 MG 24 hr tablet 90 tablet 0    Sig: TAKE 1 TABLET(500 MG) BY MOUTH DAILY WITH BREAKFAST     Endocrinology:  Diabetes - Biguanides Passed - 09/19/2023  4:27 PM      Passed - Cr in normal range and within 360 days    Creat  Date Value Ref Range Status  06/07/2023 0.85 0.60 - 1.00 mg/dL Final   Creatinine, Ser  Date Value Ref Range Status  07/27/2023 0.68 0.44 - 1.00 mg/dL Final   Creatinine, Urine  Date Value Ref Range Status  06/07/2023 119 20 - 275 mg/dL Final         Passed - HBA1C is between 0 and 7.9 and within 180 days    HbA1c, POC (controlled diabetic range)  Date Value Ref Range Status  03/05/2022 6.3 0.0 - 7.0 % Final   Hgb A1c MFr Bld  Date  Value Ref Range Status  06/07/2023 7.3 (H) <5.7 % Final    Comment:    For someone without known diabetes, a hemoglobin A1c value of 6.5% or greater indicates that they may have  diabetes and this should be confirmed with a follow-up  test. . For someone with known diabetes, a value <7% indicates  that their diabetes is well controlled and a value  greater than or equal to 7% indicates suboptimal  control. A1c targets should be individualized based on  duration of diabetes, age, comorbid conditions, and  other considerations. . Currently, no consensus exists regarding use of hemoglobin A1c for diagnosis of diabetes for children. .          Passed - eGFR in normal range and within 360 days    GFR, Est African American  Date Value Ref Range Status  12/03/2019 84 > OR = 60 mL/min/1.67m2 Final   GFR, Est Non African American  Date Value Ref Range Status  12/03/2019 72 > OR = 60 mL/min/1.68m2 Final   GFR, Estimated  Date  Value Ref Range Status  07/27/2023 >60 >60 mL/min Final    Comment:    (NOTE) Calculated using the CKD-EPI Creatinine Equation (2021)    eGFR  Date Value Ref Range Status  06/07/2023 72 > OR = 60 mL/min/1.93m2 Final         Passed - B12 Level in normal range and within 720 days    Vitamin B-12  Date Value Ref Range Status  04/18/2023 228 180 - 914 pg/mL Final    Comment:    (NOTE) This assay is not validated for testing neonatal or myeloproliferative syndrome specimens for Vitamin B12 levels. Performed at [facility], [address], [place], [place] [address]          Passed - Valid encounter within last 6 months    Recent Outpatient Visits           3 months ago Type 2 diabetes mellitus without complication, without long-term current use of insulin  [facility])   [facility] [place], [place] W, NP   4 months ago Acute cystitis without hematuria   [place] [facility] [place], Angeline ORN, NP        Future Appointments             In 1 month [place], Venetia CROME, MD [facility] Neurology            Passed - CBC within normal limits and completed in the last 12 months    WBC  Date Value Ref Range Status  07/27/2023 7.2 4.0 - 10.5 K/uL Final   RBC  Date Value Ref Range Status  07/27/2023 4.45 3.87 - 5.11 MIL/uL Final   Hemoglobin  Date Value Ref Range Status  07/27/2023 12.9 12.0 - 15.0 g/dL Final  94/80/7982 84.3 11.1 - 15.9 g/dL Final   HCT  Date Value Ref Range Status  07/27/2023 40.7 36.0 - 46.0 % Final   Hematocrit  Date Value Ref Range Status  06/24/2015 46.6 34.0 - 46.6 % Final   MCHC  Date Value Ref Range Status  07/27/2023 31.7 30.0 - 36.0 g/dL Final   [facility]  Date Value Ref Range Status  07/27/2023 29.0 26.0 - 34.0 pg Final   MCV  Date Value Ref Range Status  07/27/2023 91.5 80.0 - 100.0 fL Final  06/24/2015 93 79 - 97 fL Final  02/02/2013 90 80 - 100 fL Final   No results found for: PLTCOUNTKUC, LABPLAT, POCPLA RDW  Date Value Ref Range Status  07/27/2023 14.0 11.5 - 15.5 % Final  06/24/2015 13.3 12.3 - 15.4 % Final  02/02/2013 13.2 11.5 - 14.5 % Final          levothyroxine  (SYNTHROID ) 112 MCG tablet 90 tablet 0    Sig: TAKE 1 TABLET(112 MCG) BY MOUTH DAILY     Endocrinology:  Hypothyroid Agents Failed - 09/19/2023  4:27 PM      Failed - TSH in normal range and within 360 days    TSH  Date Value Ref Range Status  06/07/2023 14.08 (H) 0.40 - 4.50 mIU/L Final         Passed - Valid encounter within last 12 months    Recent Outpatient Visits           3 months ago Type 2 diabetes mellitus without complication, without long-term current use of insulin  [facility])   [place] [facility] [place], Angeline ORN, NP   4 months ago Acute  cystitis without hematuria   [place] [facility] [place], Angeline ORN, NP       Future Appointments             In 1 month [place], Venetia CROME, MD [facility]  Neurology               Requested Prescriptions  Pending Prescriptions Disp Refills   clonazePAM  (KLONOPIN ) 0.5 MG tablet [Pharmacy Med Name: CLONAZEPAM  0.5MG  TABLETS] 90 tablet     Sig: TAKE 1 TABLET BY MOUTH EVERY MORNING AND EVERY AFTERNOON AND EVERY EVENING     Not Delegated - Psychiatry: Anxiolytics/Hypnotics 2 Failed - 09/19/2023  4:27 PM      Failed - This refill cannot be delegated      Failed - Urine Drug Screen completed in last 360 days      Passed - Patient is not pregnant      Passed - Valid encounter within last 6 months    Recent Outpatient Visits           3 months ago Type 2 diabetes mellitus without complication, without long-term current use of insulin  (HCC)   [place] [facility] [place], [place] W, NP   4 months ago Acute cystitis without hematuria   [place] [facility] [place], Angeline ORN, NP       Future Appointments             In 1 month [place], Venetia CROME, MD [facility] Neurology            Signed Prescriptions Disp Refills   metFORMIN  (GLUCOPHAGE -XR) 500 MG 24 hr tablet 90 tablet 0    Sig: TAKE 1 TABLET(500 MG) BY MOUTH DAILY WITH BREAKFAST     Endocrinology:  Diabetes - Biguanides Passed - 09/19/2023  4:27 PM      Passed - Cr in normal range and within 360 days    Creat  Date Value Ref Range Status  06/07/2023 0.85 0.60 - 1.00 mg/dL Final   Creatinine, Ser  Date Value Ref Range Status  07/27/2023 0.68 0.44 - 1.00 mg/dL Final   Creatinine, Urine  Date Value Ref Range Status  06/07/2023 119 20 - 275 mg/dL Final         Passed - HBA1C is between 0 and 7.9 and within 180 days    HbA1c, POC (controlled diabetic range)  Date Value Ref Range Status  03/05/2022 6.3 0.0 - 7.0 % Final   Hgb A1c MFr Bld  Date Value Ref Range Status  06/07/2023 7.3 (H) <5.7 % Final    Comment:    For someone without known diabetes, a hemoglobin A1c value of 6.5% or greater indicates that they may have   diabetes and this should be confirmed with a follow-up  test. . For someone with known diabetes, a value <7% indicates  that their diabetes is well controlled and a value  greater than or equal to 7% indicates suboptimal  control. A1c targets should be individualized based on  duration of diabetes, age, comorbid conditions, and  other considerations. . Currently, no consensus exists regarding use of hemoglobin A1c for diagnosis of diabetes for children. .          Passed - eGFR in normal range and within 360 days    GFR, Est African American  Date Value Ref Range Status  12/03/2019 84 > OR = 60 mL/min/1.70m2 Final   GFR, Est Non African  American  Date Value Ref Range Status  12/03/2019 72 > OR = 60 mL/min/1.27m2 Final   GFR, Estimated  Date Value Ref Range Status  07/27/2023 >60 >60 mL/min Final    Comment:    (NOTE) Calculated using the CKD-EPI Creatinine Equation (2021)    eGFR  Date Value Ref Range Status  06/07/2023 72 > OR = 60 mL/min/1.49m2 Final         Passed - B12 Level in normal range and within 720 days    Vitamin B-12  Date Value Ref Range Status  04/18/2023 228 180 - 914 pg/mL Final    Comment:    (NOTE) This assay is not validated for testing neonatal or myeloproliferative syndrome specimens for Vitamin B12 levels. Performed at [facility], [address], [place], [place] [address]          Passed - Valid encounter within last 6 months    Recent Outpatient Visits           3 months ago Type 2 diabetes mellitus without complication, without long-term current use of insulin  [facility])   [place] [facility] [place], [place] W, NP   4 months ago Acute cystitis without hematuria   [place] [facility] [place], Angeline ORN, NP       Future Appointments             In 1 month [place], Venetia CROME, MD [facility] Neurology            Passed - CBC within normal limits and completed in the last 12 months     WBC  Date Value Ref Range Status  07/27/2023 7.2 4.0 - 10.5 K/uL Final   RBC  Date Value Ref Range Status  07/27/2023 4.45 3.87 - 5.11 MIL/uL Final   Hemoglobin  Date Value Ref Range Status  07/27/2023 12.9 12.0 - 15.0 g/dL Final  94/80/7982 84.3 11.1 - 15.9 g/dL Final   HCT  Date Value Ref Range Status  07/27/2023 40.7 36.0 - 46.0 % Final   Hematocrit  Date Value Ref Range Status  06/24/2015 46.6 34.0 - 46.6 % Final   MCHC  Date Value Ref Range Status  07/27/2023 31.7 30.0 - 36.0 g/dL Final   [facility]  Date Value Ref Range Status  07/27/2023 29.0 26.0 - 34.0 pg Final   MCV  Date Value Ref Range Status  07/27/2023 91.5 80.0 - 100.0 fL Final  06/24/2015 93 79 - 97 fL Final  02/02/2013 90 80 - 100 fL Final   No results found for: PLTCOUNTKUC, LABPLAT, POCPLA RDW  Date Value Ref Range Status  07/27/2023 14.0 11.5 - 15.5 % Final  06/24/2015 13.3 12.3 - 15.4 % Final  02/02/2013 13.2 11.5 - 14.5 % Final          levothyroxine  (SYNTHROID ) 112 MCG tablet 90 tablet 0    Sig: TAKE 1 TABLET(112 MCG) BY MOUTH DAILY     Endocrinology:  Hypothyroid Agents Failed - 09/19/2023  4:27 PM      Failed - TSH in normal range and within 360 days    TSH  Date Value Ref Range Status  06/07/2023 14.08 (H) 0.40 - 4.50 mIU/L Final         Passed - Valid encounter within last 12 months    Recent Outpatient Visits           3 months ago Type 2 diabetes mellitus without complication, without long-term current  use of insulin  [facility])   [facility] [place], [place] W, NP   4 months ago Acute cystitis without hematuria   [place] [place], Angeline ORN, NP       Future Appointments             In 1 month [place], Venetia CROME, MD [facility] Neurology

## 2023-09-19 NOTE — Telephone Encounter (Signed)
 Requested Prescriptions  Pending Prescriptions Disp Refills   metFORMIN  (GLUCOPHAGE -XR) 500 MG 24 hr tablet [Pharmacy Med Name: METFORMIN  ER 500MG  24HR TABS] 90 tablet 0    Sig: TAKE 1 TABLET(500 MG) BY MOUTH DAILY WITH BREAKFAST     Endocrinology:  Diabetes - Biguanides Passed - 09/19/2023  4:26 PM      Passed - Cr in normal range and within 360 days    Creat  Date Value Ref Range Status  06/07/2023 0.85 0.60 - 1.00 mg/dL Final   Creatinine, Ser  Date Value Ref Range Status  07/27/2023 0.68 0.44 - 1.00 mg/dL Final   Creatinine, Urine  Date Value Ref Range Status  06/07/2023 119 20 - 275 mg/dL Final         Passed - HBA1C is between 0 and 7.9 and within 180 days    HbA1c, POC (controlled diabetic range)  Date Value Ref Range Status  03/05/2022 6.3 0.0 - 7.0 % Final   Hgb A1c MFr Bld  Date Value Ref Range Status  06/07/2023 7.3 (H) <5.7 % Final    Comment:    For someone without known diabetes, a hemoglobin A1c value of 6.5% or greater indicates that they may have  diabetes and this should be confirmed with a follow-up  test. . For someone with known diabetes, a value <7% indicates  that their diabetes is well controlled and a value  greater than or equal to 7% indicates suboptimal  control. A1c targets should be individualized based on  duration of diabetes, age, comorbid conditions, and  other considerations. . Currently, no consensus exists regarding use of hemoglobin A1c for diagnosis of diabetes for children. .          Passed - eGFR in normal range and within 360 days    GFR, Est African American  Date Value Ref Range Status  12/03/2019 84 > OR = 60 mL/min/1.63m2 Final   GFR, Est Non African American  Date Value Ref Range Status  12/03/2019 72 > OR = 60 mL/min/1.58m2 Final   GFR, Estimated  Date Value Ref Range Status  07/27/2023 >60 >60 mL/min Final    Comment:    (NOTE) Calculated using the CKD-EPI Creatinine Equation (2021)    eGFR  Date Value  Ref Range Status  06/07/2023 72 > OR = 60 mL/min/1.7m2 Final         Passed - B12 Level in normal range and within 720 days    Vitamin B-12  Date Value Ref Range Status  04/18/2023 228 180 - 914 pg/mL Final    Comment:    (NOTE) This assay is not validated for testing neonatal or myeloproliferative syndrome specimens for Vitamin B12 levels. Performed at [facility], [address], [place], [place] [address]          Passed - Valid encounter within last 6 months    Recent Outpatient Visits           3 months ago Type 2 diabetes mellitus without complication, without long-term current use of insulin  [facility])   [facility] [place], [place] W, NP   4 months ago Acute cystitis without hematuria   [place] [facility] [facility] [place], Angeline ORN, NP       Future Appointments             In 1 month [place], Venetia CROME, MD [facility] Neurology  Passed - CBC within normal limits and completed in the last 12 months    WBC  Date Value Ref Range Status  07/27/2023 7.2 4.0 - 10.5 K/uL Final   RBC  Date Value Ref Range Status  07/27/2023 4.45 3.87 - 5.11 MIL/uL Final   Hemoglobin  Date Value Ref Range Status  07/27/2023 12.9 12.0 - 15.0 g/dL Final  94/80/7982 84.3 11.1 - 15.9 g/dL Final   HCT  Date Value Ref Range Status  07/27/2023 40.7 36.0 - 46.0 % Final   Hematocrit  Date Value Ref Range Status  06/24/2015 46.6 34.0 - 46.6 % Final   MCHC  Date Value Ref Range Status  07/27/2023 31.7 30.0 - 36.0 g/dL Final   [facility]  Date Value Ref Range Status  07/27/2023 29.0 26.0 - 34.0 pg Final   MCV  Date Value Ref Range Status  07/27/2023 91.5 80.0 - 100.0 fL Final  06/24/2015 93 79 - 97 fL Final  02/02/2013 90 80 - 100 fL Final   No results found for: PLTCOUNTKUC, LABPLAT, POCPLA RDW  Date Value Ref Range Status  07/27/2023 14.0 11.5 - 15.5 % Final  06/24/2015 13.3 12.3 - 15.4 % Final  02/02/2013 13.2 11.5  - 14.5 % Final          levothyroxine  (SYNTHROID ) 112 MCG tablet [Pharmacy Med Name: LEVOTHYROXINE  0.112MG  ( ) TABS] 90 tablet 0    Sig: TAKE 1 TABLET(112 MCG) BY MOUTH DAILY     Endocrinology:  Hypothyroid Agents Failed - 09/19/2023  4:26 PM      Failed - TSH in normal range and within 360 days    TSH  Date Value Ref Range Status  06/07/2023 14.08 (H) 0.40 - 4.50 mIU/L Final         Passed - Valid encounter within last 12 months    Recent Outpatient Visits           3 months ago Type 2 diabetes mellitus without complication, without long-term current use of insulin  (HCC)   [facility] [place], Angeline ORN, NP   4 months ago Acute cystitis without hematuria   [facility] [facility] [place], Angeline ORN, NP       Future Appointments             In 1 month [place], Venetia CROME, MD [facility] Neurology             clonazePAM  (KLONOPIN ) 0.5 MG tablet [Pharmacy Med Name: CLONAZEPAM  0.5MG  TABLETS] 90 tablet     Sig: TAKE 1 TABLET BY MOUTH EVERY MORNING AND EVERY AFTERNOON AND EVERY EVENING     Not Delegated - Psychiatry: Anxiolytics/Hypnotics 2 Failed - 09/19/2023  4:26 PM      Failed - This refill cannot be delegated      Failed - Urine Drug Screen completed in last 360 days      Passed - Patient is not pregnant      Passed - Valid encounter within last 6 months    Recent Outpatient Visits           3 months ago Type 2 diabetes mellitus without complication, without long-term current use of insulin  [facility])   [place] [facility] [place], Angeline ORN, NP   4 months ago Acute cystitis without hematuria    [facility] [place] Angeline ORN, NP       Future Appointments  In 1 month [place], Venetia CROME, MD [facility] Neurology

## 2023-09-27 ENCOUNTER — Telehealth: Payer: Self-pay

## 2023-09-29 ENCOUNTER — Other Ambulatory Visit: Payer: Self-pay | Admitting: Internal Medicine

## 2023-09-29 DIAGNOSIS — E039 Hypothyroidism, unspecified: Secondary | ICD-10-CM

## 2023-09-29 DIAGNOSIS — E782 Mixed hyperlipidemia: Secondary | ICD-10-CM

## 2023-10-01 NOTE — Telephone Encounter (Signed)
 Unable to refill per protocol, Rx expired. Discontinued 08/08/23.  Requested Prescriptions  Pending Prescriptions Disp Refills   rosuvastatin  (CRESTOR ) 20 MG tablet [Pharmacy Med Name: ROSUVASTATIN  20MG  TABLETS] 90 tablet 3    Sig: TAKE 1 TABLET(20 MG) BY MOUTH DAILY     Cardiovascular:  Antilipid - Statins 2 Failed - 10/01/2023 10:40 AM      Failed - Lipid Panel in normal range within the last 12 months    Cholesterol, Total  Date Value Ref Range Status  02/03/2015 205 (H) 100 - 199 mg/dL Final   Cholesterol  Date Value Ref Range Status  06/07/2023 84 <200 mg/dL Final   LDL Cholesterol (Calc)  Date Value Ref Range Status  06/07/2023 28 mg/dL (calc) Final    Comment:    Reference range: <100 . Desirable range <100 mg/dL for primary prevention;   <70 mg/dL for patients with CHD or diabetic patients  with > or = 2 CHD risk factors. SABRA LDL-C is now calculated using the Martin-Hopkins  calculation, which is a validated novel method providing  better accuracy than the Friedewald equation in the  estimation of LDL-C.  Gladis APPLETHWAITE et al. SANDREA. 7986;689(80): 2061-2068  (http://education.[employer].com/faq/FAQ164)    HDL  Date Value Ref Range Status  06/07/2023 36 (L) > OR = 50 mg/dL Final  87/70/7983 36 (L) >39 mg/dL Final   Triglycerides  Date Value Ref Range Status  06/07/2023 117 <150 mg/dL Final         Passed - Cr in normal range and within 360 days    Creat  Date Value Ref Range Status  06/07/2023 0.85 0.60 - 1.00 mg/dL Final   Creatinine, Ser  Date Value Ref Range Status  07/27/2023 0.68 0.44 - 1.00 mg/dL Final   Creatinine, Urine  Date Value Ref Range Status  06/07/2023 119 20 - 275 mg/dL Final         Passed - Patient is not pregnant      Passed - Valid encounter within last 12 months    Recent Outpatient Visits           3 months ago Type 2 diabetes mellitus without complication, without long-term current use of insulin  [facility])   [place] [facility] [place], [place] W, NP   5 months ago Acute cystitis without hematuria   [place] [place], Angeline ORN, NP       Future Appointments             In 3 weeks Leigh, Venetia CROME, MD [facility] Neurology

## 2023-10-09 ENCOUNTER — Ambulatory Visit

## 2023-10-09 NOTE — Progress Notes (Deleted)
 Initial neurology clinic note  Reason for Evaluation: Consultation requested by Claudene Penne ORN, MD for an opinion regarding ***. My final recommendations will be communicated back to the requesting physician by way of shared medical record or letter to requesting physician via [place]  mail.  HPI: This is [name], a 73 y.o. ***-handed female with a medical history of DM, PVD, HLD, HTN, hypothyroidism, COPD, GERD, psoriasis, anxiety*** who presents to neurology clinic with the chief complaint of ***. The patient is accompanied by ***.  *** Had L3-4 lami, L4-5 lami and discetomy of left side and L5-S1 lami on 03/02/23 by Dr. Claudene Previous lumbar surgery ~2019 Initially did well after surgery with resolution of pain and improvement of weakness Now with increased falls and unsteadiness Continued pain and sensory loss in legs Has discoloration of distal LE (LLE?) Evaluated by vascular with no evidence of vascular claudication NSGY wondered about PN or CPRS - referred to me and Dr. Marcelino  She had repeat imaging that did show some progression of spondylosis after decompression. Per Dr. Claudene, his impression is that she has radiculitis likely due to ongoing stenosis and trauma from a fall. Some of her symptoms could be CPRS as well. She was then referred to Dr. Marcelino in pain management for consideration of less invasive option than repeat surgery, such as spinal cord stimulation.  Per referring documentation: Please evaluate for left lower extremity radiculopathy versus peripheral neuropathy.  Circumferential sensation loss in the left foot.  History of previous lumbar radiculopathy. Has significant color changing and swelling in her left lower extremity, vascular negative.  Potentially neuropathic in origin. Please consider EMG and addition to a formal consultation. She is undergoing work up for spinal cord stimulator trial.  On fenofibrate  and Crestor  for HLD Any pain medication? Any  supplements?  The patient has not*** had similar episodes of symptoms in the past. ***  Muscle bulk loss? *** Muscle pain? ***  Cramps/Twitching? *** Suggestion of myotonia/difficulty relaxing after contraction? ***  Fatigable weakness?*** Does strength improve after brief exercise?***  Able to brush hair/teeth without difficulty? *** Able to button shirts/use zips? *** Clumsiness/dropping grasped objects?*** Can you arise from squatted position easily? *** Able to get out of chair without using arms? *** Able to walk up steps easily? *** Use an assistive device to walk? *** Significant imbalance with walking? *** Falls?*** Any change in urine color, especially after exertion/physical activity? ***  The patient denies*** symptoms suggestive of oculobulbar weakness including diplopia, ptosis, dysphagia, poor saliva control, dysarthria/dysphonia, impaired mastication, facial weakness/droop.  There are no*** neuromuscular respiratory weakness symptoms, particularly orthopnea>dyspnea.   Pseudobulbar affect is absent***.  The patient does not*** report symptoms referable to autonomic dysfunction including impaired sweating, heat or cold intolerance, excessive mucosal dryness, gastroparetic early satiety, postprandial abdominal bloating, constipation, bowel or bladder dyscontrol, erectile dysfunction*** or syncope/presyncope/orthostatic intolerance.  There are no*** complaints relating to other symptoms of small fiber modalities including paresthesia/pain.  The patient has not *** noticed any recent skin rashes nor does he*** report any constitutional symptoms like fever, night sweats, anorexia or unintentional weight loss.  EtOH use: ***  Restrictive diet? *** Family history of neuropathy/myopathy/NM disease?***  Previous labs, electrodiagnostics, and neuroimaging are summarized below, but pertinent findings include***  Any biopsy done? *** Current medications being tried for the patient's  symptoms include ***  Prior medications that have been tried: ***   MEDICATIONS:  Outpatient Encounter Medications as of 10/23/2023  Medication Sig Note  acetaminophen  (TYLENOL ) 500 MG tablet Take 500 mg by mouth every 6 (six) hours as needed for moderate pain.  04/17/2023: prn   albuterol  (VENTOLIN  HFA) 108 (90 Base) MCG/ACT inhaler INHALE 2 PUFFS BY MOUTH EVERY 6 HOURS AS NEEDED FOR WHEEZING OR SHORTNESS OF BREATH    citalopram  (CELEXA ) 20 MG tablet TAKE 1 TABLET(20 MG) BY MOUTH DAILY    clonazePAM  (KLONOPIN ) 0.5 MG tablet TAKE 1 TABLET BY MOUTH EVERY MORNING AND EVERY AFTERNOON AND EVERY EVENING    diltiazem  (CARDIZEM  LA) 120 MG 24 hr tablet Take 1 tablet (120 mg total) by mouth daily.    fenofibrate  (TRICOR ) 145 MG tablet TAKE 1 TABLET(145 MG) BY MOUTH DAILY    fluticasone  furoate-vilanterol (BREO ELLIPTA ) 200-25 MCG/ACT AEPB Inhale 1 puff into the lungs daily.    HYDROcodone -acetaminophen  (NORCO/VICODIN) 5-325 MG tablet Take 1 tablet by mouth every 4 (four) hours as needed.    levothyroxine  (SYNTHROID ) 112 MCG tablet TAKE 1 TABLET(112 MCG) BY MOUTH DAILY    metFORMIN  (GLUCOPHAGE -XR) 500 MG 24 hr tablet TAKE 1 TABLET(500 MG) BY MOUTH DAILY WITH BREAKFAST    Misc. Devices (QUAD CANE/SMALL BASE) MISC 1 Device by Does not apply route daily.    OXYGEN  Inhale 2 L/min into the lungs as needed.    triamcinolone  ointment (KENALOG ) 0.1 % Apply 1 Application topically as needed. 03/26/2023: prn   No facility-administered encounter medications on file as of 10/23/2023.    PAST MEDICAL HISTORY: Past Medical History:  Diagnosis Date   Anxiety    Arthritis    Chronic back pain    COPD (chronic obstructive pulmonary disease) (HCC)    Depression    GERD (gastroesophageal reflux disease)    Hypertension    Hypothyroidism    Neuromuscular disorder (HCC)    Pneumonia    RECENT X 3   PONV (postoperative nausea and vomiting)    Psoriasis    Thyroid  disease     PAST SURGICAL HISTORY: Past  Surgical History:  Procedure Laterality Date   ABDOMINAL HYSTERECTOMY     heavy bleeding   bladder stimulator     CARPAL TUNNEL RELEASE     CATARACT EXTRACTION W/PHACO Right 03/07/2015   Procedure: CATARACT EXTRACTION PHACO AND INTRAOCULAR LENS PLACEMENT (IOC);  Surgeon: Steven Dingeldein, MD;  Location: [facility];  Service: Ophthalmology;  Laterality: Right;  US  01:14 AP% 26.4 CDE 33.04 fluid pack lot # [id number]   CATARACT EXTRACTION W/PHACO Left 03/21/2015   Procedure: CATARACT EXTRACTION PHACO AND INTRAOCULAR LENS PLACEMENT (IOC);  Surgeon: Steven Dingeldein, MD;  Location: [facility];  Service: Ophthalmology;  Laterality: Left;  US  01:21 AP% 25.6 CDE 39.78 fluid pack lot # [id number]   HAND SURGERY Right    INTERSTIM IMPLANT REMOVAL N/A 09/07/2015   Procedure: REMOVAL OF RENNA IMPLANT;  Surgeon: Glendia Elizabeth, MD;  Location: [facility];  Service: Urology;  Laterality: N/A;   LUMBAR LAMINECTOMY/DECOMPRESSION MICRODISCECTOMY N/A 02/28/2023   Procedure: L3-4 LAMINECTOMY, L4-5 LAMINECTOMY AND DISCECTOMY OF LEFT SIDE, L5-S1 LAMINECTOMY;  Surgeon: Claudene Penne ORN, MD;  Location: [facility];  Service: Neurosurgery;  Laterality: N/A;   OOPHORECTOMY Right    ovarian cyst    ALLERGIES: Allergies  Allergen Reactions   Ciprofloxacin Hives and Diarrhea   Gabapentin  Other (See Comments)    Hallucination   Trazodone  And Nefazodone Other (See Comments)    Altered Mental Status    Trazodone  Other (See Comments)   Mucinex  [Guaifenesin  Er] Nausea Only   Oxycodone  Diarrhea   Sulfa Antibiotics Rash  Throat, mouth Throat, mouth    FAMILY HISTORY: Family History  Problem Relation Age of Onset   Heart disease Mother    Breast cancer Mother    Heart attack Mother    COPD Father    Stroke Paternal Uncle    Kidney disease Sister    Breast cancer Cousin    Breast cancer Cousin    Heart attack Maternal Aunt    Stroke Paternal Grandfather    Kidney cancer Neg Hx    Bladder Cancer Neg Hx      SOCIAL HISTORY: Social History   Tobacco Use   Smoking status: Former    Current packs/day: 0.00    Average packs/day: 1 pack/day for 35.0 years (35.0 ttl pk-yrs)    Types: Cigarettes    Start date: 04/19/2018    Quit date: 11/16/2020    Years since quitting: 2.8   Smokeless tobacco: Never   Tobacco comments:    Previously smoked 2 ppd.   Vaping Use   Vaping status: Never Used  Substance Use Topics   Alcohol  use: No   Drug use: No   Social History   Social History Narrative   Not on file     OBJECTIVE: PHYSICAL EXAM: There were no vitals taken for this visit.  General:*** General appearance: Awake and alert. No distress. Cooperative with exam.  Skin: No obvious rash or jaundice. HEENT: Atraumatic. Anicteric. Lungs: Non-labored breathing on room air  Heart: Regular Abdomen: Soft, non tender. Extremities: No edema. No obvious deformity.  Musculoskeletal: No obvious joint swelling. Psych: Affect appropriate.  Neurological: Mental Status: Alert. Speech fluent. No pseudobulbar affect Cranial Nerves: CNII: No RAPD. Visual fields grossly intact. CNIII, IV, VI: PERRL. No nystagmus. EOMI. CN V: Facial sensation intact bilaterally to fine touch. [place]eter clench strong. Jaw jerk***. CN VII: Facial muscles symmetric and strong. No ptosis at rest or after sustained upgaze***. CN VIII: Hearing grossly intact bilaterally. CN IX: No hypophonia. CN X: Palate elevates symmetrically. CN XI: Full strength shoulder shrug bilaterally. CN XII: Tongue protrusion full and midline. No atrophy or fasciculations. No significant dysarthria*** Motor: Tone is ***. *** fasciculations in *** extremities. *** atrophy. No grip or percussive myotonia.***  Individual muscle group testing (MRC grade out of 5):  Movement     Neck flexion ***    Neck extension ***     Right Left   Shoulder abduction *** ***   Shoulder adduction *** ***   Shoulder ext rotation *** ***   Shoulder  int rotation *** ***   Elbow flexion *** ***   Elbow extension *** ***   Wrist extension *** ***   Wrist flexion *** ***   Finger abduction - FDI *** ***   Finger abduction - ADM *** ***   Finger extension *** ***   Finger distal flexion - 2/3 *** ***   Finger distal flexion - 4/5 *** ***   Thumb flexion - FPL *** ***   Thumb abduction - APB *** ***    Hip flexion *** ***   Hip extension *** ***   Hip adduction *** ***   Hip abduction *** ***   Knee extension *** ***   Knee flexion *** ***   Dorsiflexion *** ***   Plantarflexion *** ***   Inversion *** ***   Eversion *** ***   Great toe extension *** ***   Great toe flexion *** ***     Reflexes:  Right Left   Bicep *** ***  Tricep *** ***   BrRad *** ***   Knee *** ***   Ankle *** ***    Pathological Reflexes: Babinski: *** response bilaterally*** Hoffman: *** Troemner: *** Pectoral: *** Palmomental: *** Facial: *** Midline tap: *** Sensation: Pinprick: *** Vibration: *** Temperature: *** Proprioception: *** Coordination: Intact finger-to- nose-finger bilaterally. Romberg negative.*** Gait: Able to rise from chair with arms crossed unassisted. Normal, narrow-based gait. Able to tandem walk. Able to walk on toes and heels.***  Lab and Test Review: Internal labs: 07/27/23: CMP significant for glucose 134 CBC w/ diff unremarkable  06/07/23: HbA1c: 7.3 Lipid panel: tChol 84, LDL 28, TG 117 TSH wnl  04/18/23: Vit D wnl B12: 228 Folate wnl  External labs: ***  Imaging/Procedures: CT head wo contrast (05/17/23): Brain: No acute intracranial hemorrhage. No CT evidence of acute infarct. Nonspecific hypoattenuation in the periventricular and subcortical white matter favored to reflect chronic microvascular ischemic changes. No edema, mass effect, or midline shift. The basilar cisterns are patent.   Ventricles: Prominence of the ventricles suggesting underlying parenchymal volume loss.   Vascular:  Atherosclerotic calcifications of the carotid siphons and intracranial vertebral arteries. No hyperdense vessel.   Skull: No acute or aggressive finding.   Orbits: Orbits are symmetric.   Sinuses: Mucosal thickening in the right maxillary sinus with thickening of the sinus walls suggestive of mucoperiosteal reaction. Small mucous retention cyst in the right frontal sinus.   Other: Mastoid air cells are clear.  MRI lumbar spine wo contrast (08/16/23): FINDINGS:   BONES AND ALIGNMENT: Slight retrolisthesis at L1-2, L2-3, and L3-4, stable. Rapid curvature centered at L3 is stable. Slight anterolisthesis at L5 to S1 is stable.   SPINAL CORD: The conus medullaris terminates at L2.   SOFT TISSUES: No paraspinal mass.   L1-L2: Stable mild disc bulging is present without focal stenosis or change.   L2-L3: A leftward disc protrusion is present. Mild facet hypertrophy is present. Mild central and left greater than right foraminal narrowing is stable.   L3-L4: Decompressive laminectomy is noted. Mild central canal narrowing remains. Moderate subarticular stenosis is present left greater than right. Moderate foraminal stenosis left greater than right, has progressed.   L4-L5: Decompressive laminectomy noted. Mild residual central canal stenosis is present. Moderate facet hypertrophy is present bilaterally. Moderate foraminal narrowing is stable bilaterally.   L5-S1: Laminectomy again noted. Moderate facet hypertrophy and spurring is present. No focal stenosis is present.   IMPRESSION: 1. Stable slight retrolisthesis at L1-2, L2-3, and L3-4, and slight anterolisthesis at L5-S1. 2. Stable mild disc bulging at L1-2 without focal stenosis. 3. Leftward disc protrusion at L2-3 with mild central and left greater than right foraminal narrowing, stable. 4. Post-surgical changes at L3-L4, L4-L5, and L5-S1 levels with mild to moderate residual stenosis as described above. Moderate  foraminal stenosis at L3-L4 has progressed.  MRI cervical spine wo contrast (08/16/23): FINDINGS: Alignment: Straightening with mild reversal of the normal cervical lordosis. Trace degenerative anterolisthesis of C3 on C4 and C7 on T1.   Vertebrae: Vertebral body height maintained without acute or chronic fracture. Bone marrow signal intensity within normal limits. No worrisome osseous lesions. Mild degenerative reactive endplate changes present at the C4-5 through C7-T1 interspaces. No other abnormal marrow edema.   Cord: Normal signal and morphology. No cord signal changes to suggest myelopathy.   Posterior Fossa, vertebral arteries, paraspinal tissues: Age-related cerebral atrophy. Visualized brain and posterior fossa otherwise unremarkable. Craniocervical junction within normal limits. Paraspinous soft tissues normal. Normal flow voids seen within  the vertebral arteries bilaterally.   Disc levels:   C2-C3: Tiny central disc protrusion mildly indents the ventral thecal sac. No spinal stenosis. Foramina remain patent.   C3-C4: Trace anterolisthesis. Mild disc bulge with uncovertebral spurring. No canal or foraminal stenosis.   C4-C5: Moderate intervertebral disc space narrowing with circumferential disc osteophyte complex. Flattening and partial effacement of the ventral thecal sac with mild spinal stenosis. Moderate bilateral C5 foraminal narrowing.   C5-C6: Moderate degenerative intervertebral disc space narrowing circumferential disc osteophyte complex. Flattening and partial effacement of the ventral thecal sac with mild spinal stenosis. Severe right worse than left C6 foraminal narrowing.   C6-C7: Degenerative intervertebral disc space narrowing with circumferential disc osteophyte complex. Mild spinal stenosis. Moderate bilateral C7 foraminal narrowing.   C7-T1: Trace anterolisthesis. Mild disc bulge with uncovertebral spurring,. No significant spinal stenosis.  Mild right with moderate left C8 foraminal narrowing.   IMPRESSION: 1. Normal MRI appearance of the cervical spinal cord. No cord signal changes to suggest myelopathy. 2. Multilevel cervical spondylosis with resultant mild spinal stenosis at C4-5 through C6-7. 3. Multifactorial degenerative changes with resultant multilevel foraminal narrowing as above. Notable findings include moderate bilateral C5 foraminal stenosis, severe right worse than left C6 foraminal narrowing, with moderate bilateral C7 and left C8 foraminal stenosis. ***  ASSESSMENT: [name] is a 73 y.o. female who presents for evaluation of ***. *** has a relevant medical history of ***. *** neurological examination is pertinent for ***. Available diagnostic data is significant for ***. This constellation of symptoms and objective data would most likely localize to ***. ***  PLAN: -Blood work: *** ***  -Return to clinic ***  The impression above as well as the plan as outlined below were extensively discussed with the patient (in the company of ***) who voiced understanding. All questions were answered to their satisfaction.  The patient was counseled on pertinent fall precautions per the printed material provided today, and as noted under the Patient Instructions section below.***  When available, results of the above investigations and possible further recommendations will be communicated to the patient via telephone/MyChart. Patient to call office if not contacted after expected testing turnaround time.   Total time spent reviewing records, interview, history/exam, documentation, and coordination of care on day of encounter:  *** min   Thank you for allowing me to participate in patient's care.  If I can answer any additional questions, I would be pleased to do so.  Venetia Potters, MD   CC: Antonette Angeline ORN, NP [address] [place] [address]  CC: Referring provider: Claudene Penne ORN, MD [address] [place],  [place] [address]

## 2023-10-15 ENCOUNTER — Encounter: Payer: Self-pay | Admitting: Internal Medicine

## 2023-10-15 ENCOUNTER — Ambulatory Visit (INDEPENDENT_AMBULATORY_CARE_PROVIDER_SITE_OTHER): Admitting: Internal Medicine

## 2023-10-15 VITALS — BP 140/80 | HR 77 | Ht 63.5 in | Wt 190.6 lb

## 2023-10-15 DIAGNOSIS — K429 Umbilical hernia without obstruction or gangrene: Secondary | ICD-10-CM

## 2023-10-15 DIAGNOSIS — Z23 Encounter for immunization: Secondary | ICD-10-CM | POA: Diagnosis not present

## 2023-10-15 DIAGNOSIS — I10 Essential (primary) hypertension: Secondary | ICD-10-CM

## 2023-10-15 DIAGNOSIS — J019 Acute sinusitis, unspecified: Secondary | ICD-10-CM | POA: Diagnosis not present

## 2023-10-15 DIAGNOSIS — B9689 Other specified bacterial agents as the cause of diseases classified elsewhere: Secondary | ICD-10-CM

## 2023-10-15 DIAGNOSIS — J209 Acute bronchitis, unspecified: Secondary | ICD-10-CM | POA: Diagnosis not present

## 2023-10-15 MED ORDER — LISINOPRIL 10 MG PO TABS: 10.0000 mg | ORAL_TABLET | Freq: Every day | ORAL | 1 refills | Status: DC

## 2023-10-15 NOTE — Progress Notes (Signed)
 Subjective:    Patient ID: [name], female    DOB: 1950/09/29, 73 y.o.   MRN: [id number]  HPI  Discussed the use of AI scribe software for clinical note transcription with the patient, who gave verbal consent to proceed.  [name] is a 73 year old female with hypertension who presents with persistent headaches.  She has been experiencing persistent headaches for about two weeks, described as originating from the front of her head and radiating to the back. The headaches have slightly improved. She acknowledges that her blood pressure has been high, which she suspects may be contributing to her headaches. Her blood pressure was 150/80 during her visit to acute care. She was previously on lisinopril , which was discontinued after a hospital stay for back surgery in January 2025, and has not been on blood pressure medication since then. She is drinking water, which she believes helps with her headaches.  Two weeks ago, she initially experienced a severe headache, head congestion, chest congestion, sore throat, and cough. She visited an acute care facility on September 5th and was diagnosed with a sinus infection and bronchitis. A COVID test was negative. She was prescribed tessalon  perles, prednisone , and augmentin , which she is currently taking and reports feeling much better.  She also reports a mass in her periumbilical region.  She reports that this area seems to have gotten bigger in size.  It is uncomfortable at times.  She has not noticed any changes in her bowel habits or difficulty using the bathroom.      Review of Systems     Past Medical History:  Diagnosis Date   Anxiety    Arthritis    Chronic back pain    COPD (chronic obstructive pulmonary disease) (HCC)    Depression    GERD (gastroesophageal reflux disease)    Hypertension    Hypothyroidism    Neuromuscular disorder (HCC)    Pneumonia    RECENT X 3   PONV (postoperative nausea and vomiting)     Psoriasis    Thyroid  disease     Current Outpatient Medications  Medication Sig Dispense Refill   acetaminophen  (TYLENOL ) 500 MG tablet Take 500 mg by mouth every 6 (six) hours as needed for moderate pain.      albuterol  (VENTOLIN  HFA) 108 (90 Base) MCG/ACT inhaler INHALE 2 PUFFS BY MOUTH EVERY 6 HOURS AS NEEDED FOR WHEEZING OR SHORTNESS OF BREATH 8.5 g 2   amoxicillin -clavulanate (AUGMENTIN ) 875-125 MG tablet Take 875 mg by mouth.     benzonatate  (TESSALON ) 200 MG capsule Take 200 mg by mouth.     citalopram  (CELEXA ) 20 MG tablet TAKE 1 TABLET(20 MG) BY MOUTH DAILY 90 tablet 0   clonazePAM  (KLONOPIN ) 0.5 MG tablet TAKE 1 TABLET BY MOUTH EVERY MORNING AND EVERY AFTERNOON AND EVERY EVENING 90 tablet 0   diltiazem  (CARDIZEM  LA) 120 MG 24 hr tablet Take 1 tablet (120 mg total) by mouth daily.     fenofibrate  (TRICOR ) 145 MG tablet TAKE 1 TABLET(145 MG) BY MOUTH DAILY 90 tablet 0   fluticasone  furoate-vilanterol (BREO ELLIPTA ) 200-25 MCG/ACT AEPB Inhale 1 puff into the lungs daily. 1 each 11   levothyroxine  (SYNTHROID ) 112 MCG tablet TAKE 1 TABLET(112 MCG) BY MOUTH DAILY 90 tablet 0   metFORMIN  (GLUCOPHAGE -XR) 500 MG 24 hr tablet TAKE 1 TABLET(500 MG) BY MOUTH DAILY WITH BREAKFAST 90 tablet 0   Misc. Devices (QUAD CANE/SMALL BASE) MISC 1 Device by Does not apply route  daily. 1 each 0   nystatin  (MYCOSTATIN ) 100000 UNIT/ML suspension 5 mLs.     OXYGEN  Inhale 2 L/min into the lungs as needed.     predniSONE  (DELTASONE ) 20 MG tablet Take 20 mg by mouth.     triamcinolone  ointment (KENALOG ) 0.1 % Apply 1 Application topically as needed.     HYDROcodone -acetaminophen  (NORCO/VICODIN) 5-325 MG tablet Take 1 tablet by mouth every 4 (four) hours as needed. (Patient not taking: Reported on 10/15/2023) 15 tablet 0   lisinopril  (ZESTRIL ) 10 MG tablet Take 1 tablet (10 mg total) by mouth daily. 90 tablet 1   No current facility-administered medications for this visit.    Allergies  Allergen Reactions    Ciprofloxacin Hives and Diarrhea   Gabapentin  Other (See Comments)    Hallucination   Trazodone  And Nefazodone Other (See Comments)    Altered Mental Status    Trazodone  Other (See Comments)   Mucinex  [Guaifenesin  Er] Nausea Only   Oxycodone  Diarrhea   Sulfa Antibiotics Rash    Throat, mouth Throat, mouth    Family History  Problem Relation Age of Onset   Heart disease Mother    Breast cancer Mother    Heart attack Mother    COPD Father    Stroke Paternal Uncle    Kidney disease Sister    Breast cancer Cousin    Breast cancer Cousin    Heart attack Maternal Aunt    Stroke Paternal Grandfather    Kidney cancer Neg Hx    Bladder Cancer Neg Hx     Social History   Socioeconomic History   Marital status: Widowed    Spouse name: Not on file   Number of children: Not on file   Years of education: Not on file   Highest education level: Not on file  Occupational History   Occupation: retired  Tobacco Use   Smoking status: Former    Current packs/day: 0.00    Average packs/day: 1 pack/day for 35.0 years (35.0 ttl pk-yrs)    Types: Cigarettes    Start date: 04/19/2018    Quit date: 11/16/2020    Years since quitting: 2.9   Smokeless tobacco: Never   Tobacco comments:    Previously smoked 2 ppd.   Vaping Use   Vaping status: Never Used  Substance and Sexual Activity   Alcohol  use: No   Drug use: No   Sexual activity: Not Currently    Birth control/protection: Surgical  Other Topics Concern   Not on file  Social History Narrative   Not on file   Social Drivers of Health   Financial Resource Strain: Low Risk  (07/09/2023)   Overall Financial Resource Strain (CARDIA)    Difficulty of Paying Living Expenses: Not very hard  Food Insecurity: No Food Insecurity (07/09/2023)   Hunger Vital Sign    Worried About Running Out of Food in the Last Year: Never true    Ran Out of Food in the Last Year: Never true  Transportation Needs: No Transportation Needs (07/09/2023)    PRAPARE - Administrator, Civil Service (Medical): No    Lack of Transportation (Non-Medical): No  Physical Activity: Insufficiently Active (05/03/2023)   Exercise Vital Sign    Days of Exercise per Week: 2 days    Minutes of Exercise per Session: 20 min  Stress: No Stress Concern Present (07/09/2023)   [employer] of Occupational Health - Occupational Stress Questionnaire    Feeling of Stress : Not at  all  Social Connections: Moderately Integrated (07/09/2023)   Social Connection and Isolation Panel    Frequency of Communication with Friends and Family: More than three times a week    Frequency of Social Gatherings with Friends and Family: More than three times a week    Attends Religious Services: More than 4 times per year    Active Member of [employer] or Organizations: Yes    Attends Banker Meetings: More than 4 times per year    Marital Status: Widowed  Intimate Partner Violence: Not At Risk (07/09/2023)   Humiliation, Afraid, Rape, and Kick questionnaire    Fear of Current or Ex-Partner: No    Emotionally Abused: No    Physically Abused: No    Sexually Abused: No     Constitutional: Patient reports headache.  Denies fever, malaise, fatigue, or abrupt weight changes.  HEENT: Denies eye pain, eye redness, ear pain, ringing in the ears, wax buildup, runny nose, nasal congestion, bloody nose, or sore throat. Respiratory: Patient reports chronic cough and shortness of breath.  Denies difficulty breathing, or sputum production.   Cardiovascular: Denies chest pain, chest tightness, palpitations or swelling in the hands or feet.  Gastrointestinal: Patient reports mass of abdomen.  Denies abdominal pain, bloating, constipation, diarrhea or blood in the stool.  GU: Denies urgency, frequency, pain with urination, burning sensation, blood in urine, odor or discharge. Musculoskeletal: Patient reports joint pain.  Denies decrease in range of motion, difficulty with gait,  muscle pain or joint swelling.  Skin: Denies redness, rashes, lesions or ulcercations.  Neurological: Patient reports insomnia, paresthesia of left lower extremity.  Denies dizziness, difficulty with memory, difficulty with speech or problems with balance and coordination.  Psych: Patient has a history of anxiety and depression.  Denies SI/HI.  No other specific complaints in a complete review of systems (except as listed in HPI above).  Objective:   Physical Exam BP (!) 144/84 (BP Location: Right Arm, Patient Position: Sitting, Cuff Size: Normal)   Pulse 77   Ht 5' 3.5 (1.613 m)   Wt 190 lb 9.6 oz (86.5 kg)   SpO2 96%   BMI 33.23 kg/m    Wt Readings from Last 3 Encounters:  10/15/23 190 lb 9.6 oz (86.5 kg)  08/08/23 195 lb (88.5 kg)  07/27/23 180 lb (81.6 kg)    General: Appears her stated age, obese, in NAD. Skin: Warm, dry and intact.  HEENT: Head: normal shape and size, no sinus tenderness noted; Eyes: sclera white, no icterus, conjunctiva pink, PERRLA and EOMs intact;  Neck: No adenopathy noted. Cardiovascular: Normal rate and rhythm. S1,S2 noted.  No murmur, rubs or gallops noted.  Pulmonary/Chest: Normal effort and positive vesicular breath sounds. No respiratory distress. No wheezes, rales or ronchi noted.  Abdomen: Periumbilical hernia noted.  Normal bowel sounds. Musculoskeletal: Gait slow and steady with use of rolling walker. Neurological: Alert and oriented.   BMET    Component Value Date/Time   NA 138 07/27/2023 1025   NA 142 06/24/2015 1102   NA 135 (L) 02/02/2013 1021   K 3.6 07/27/2023 1025   K 3.7 02/02/2013 1021   CL 102 07/27/2023 1025   CL 103 02/02/2013 1021   CO2 26 07/27/2023 1025   CO2 30 02/02/2013 1021   GLUCOSE 134 (H) 07/27/2023 1025   GLUCOSE 152 (H) 02/02/2013 1021   BUN 17 07/27/2023 1025   BUN 12 06/24/2015 1102   BUN 8 02/02/2013 1021   CREATININE 0.68 07/27/2023  1025   CREATININE 0.85 06/07/2023 1051   CALCIUM  9.3 07/27/2023  1025   CALCIUM  9.9 02/02/2013 1021   GFRNONAA >60 07/27/2023 1025   GFRNONAA 72 12/03/2019 0947   GFRAA 84 12/03/2019 0947    Lipid Panel     Component Value Date/Time   CHOL 84 06/07/2023 1051   CHOL 205 (H) 02/03/2015 1022   TRIG 117 06/07/2023 1051   HDL 36 (L) 06/07/2023 1051   HDL 36 (L) 02/03/2015 1022   CHOLHDL 2.3 06/07/2023 1051   LDLCALC 28 06/07/2023 1051    CBC    Component Value Date/Time   WBC 7.2 07/27/2023 1025   RBC 4.45 07/27/2023 1025   HGB 12.9 07/27/2023 1025   HGB 15.6 06/24/2015 1102   HCT 40.7 07/27/2023 1025   HCT 46.6 06/24/2015 1102   PLT 251 07/27/2023 1025   PLT 307 06/24/2015 1102   MCV 91.5 07/27/2023 1025   MCV 93 06/24/2015 1102   MCV 90 02/02/2013 1021   MCH 29.0 07/27/2023 1025   MCHC 31.7 07/27/2023 1025   RDW 14.0 07/27/2023 1025   RDW 13.3 06/24/2015 1102   RDW 13.2 02/02/2013 1021   LYMPHSABS 2.0 07/27/2023 1025   LYMPHSABS 2.3 06/24/2015 1102   MONOABS 0.7 07/27/2023 1025   EOSABS 0.5 07/27/2023 1025   EOSABS 0.3 06/24/2015 1102   BASOSABS 0.1 07/27/2023 1025   BASOSABS 0.1 06/24/2015 1102    Hgb A1C Lab Results  Component Value Date   HGBA1C 7.3 (H) 06/07/2023            Assessment & Plan:   Assessment and Plan    Urgent care followup for acute sinusitis and acute bronchitis Urgent care notes and tests reviewed.  Symptoms improved with treatment. - Continue tessalon  perles, prednisone , and augmentin  as previously prescribed.  Essential hypertension Blood pressure elevated at 150/80 mmHg. Previously controlled with lisinopril , discontinued post-surgery. - Restart lisinopril  10 mg daily - Target is less than 130/80 mmHg.  -Reinforced DASH diet  Periumbilical hernia  Periumbilical hernia noted on CT from 07/2023 - Referral to general surgery for surgical evaluation   RTC in 2 weeks for followup HTN and 2 months for your annual exam Angeline Laura, NP

## 2023-10-15 NOTE — Patient Instructions (Signed)
 Hypertension, Adult Hypertension is another name for high blood pressure. High blood pressure forces your heart to work harder to pump blood. This can cause problems over time. There are two numbers in a blood pressure reading. There is a top number (systolic) over a bottom number (diastolic). It is best to have a blood pressure that is below 120/80. What are the causes? The cause of this condition is not known. Some other conditions can lead to high blood pressure. What increases the risk? Some lifestyle factors can make you more likely to develop high blood pressure: Smoking. Not getting enough exercise or physical activity. Being overweight. Having too much fat, sugar, calories, or salt (sodium) in your diet. Drinking too much alcohol. Other risk factors include: Having any of these conditions: Heart disease. Diabetes. High cholesterol. Kidney disease. Obstructive sleep apnea. Having a family history of high blood pressure and high cholesterol. Age. The risk increases with age. Stress. What are the signs or symptoms? High blood pressure may not cause symptoms. Very high blood pressure (hypertensive crisis) may cause: Headache. Fast or uneven heartbeats (palpitations). Shortness of breath. Nosebleed. Vomiting or feeling like you may vomit (nauseous). Changes in how you see. Very bad chest pain. Feeling dizzy. Seizures. How is this treated? This condition is treated by making healthy lifestyle changes, such as: Eating healthy foods. Exercising more. Drinking less alcohol. Your doctor may prescribe medicine if lifestyle changes do not help enough and if: Your top number is above 130. Your bottom number is above 80. Your personal target blood pressure may vary. Follow these instructions at home: Eating and drinking  If told, follow the DASH eating plan. To follow this plan: Fill one half of your plate at each meal with fruits and vegetables. Fill one fourth of your plate  at each meal with whole grains. Whole grains include whole-wheat pasta, brown rice, and whole-grain bread. Eat or drink low-fat dairy products, such as skim milk or low-fat yogurt. Fill one fourth of your plate at each meal with low-fat (lean) proteins. Low-fat proteins include fish, chicken without skin, eggs, beans, and tofu. Avoid fatty meat, cured and processed meat, or chicken with skin. Avoid pre-made or processed food. Limit the amount of salt in your diet to less than 1,500 mg each day. Do not drink alcohol if: Your doctor tells you not to drink. You are pregnant, may be pregnant, or are planning to become pregnant. If you drink alcohol: Limit how much you have to: 0-1 drink a day for women. 0-2 drinks a day for men. Know how much alcohol is in your drink. In the [place] one drink equals one 12 oz bottle of beer (355 mL), one 5 oz glass of wine (148 mL), or one 1 oz glass of hard liquor (44 mL). Lifestyle  Work with your doctor to stay at a healthy weight or to lose weight. Ask your doctor what the best weight is for you. Get at least 30 minutes of exercise that causes your heart to beat faster (aerobic exercise) most days of the week. This may include walking, swimming, or biking. Get at least 30 minutes of exercise that strengthens your muscles (resistance exercise) at least 3 days a week. This may include lifting weights or doing Pilates. Do not smoke or use any products that contain nicotine or tobacco. If you need help quitting, ask your doctor. Check your blood pressure at home as told by your doctor. Keep all follow-up visits. Medicines Take over-the-counter and prescription medicines  only as told by your doctor. Follow directions carefully. Do not skip doses of blood pressure medicine. The medicine does not work as well if you skip doses. Skipping doses also puts you at risk for problems. Ask your doctor about side effects or reactions to medicines that you should watch  for. Contact a doctor if: You think you are having a reaction to the medicine you are taking. You have headaches that keep coming back. You feel dizzy. You have swelling in your ankles. You have trouble with your vision. Get help right away if: You get a very bad headache. You start to feel mixed up (confused). You feel weak or numb. You feel faint. You have very bad pain in your: Chest. Belly (abdomen). You vomit more than once. You have trouble breathing. These symptoms may be an emergency. Get help right away. Call 911. Do not wait to see if the symptoms will go away. Do not drive yourself to the hospital. Summary Hypertension is another name for high blood pressure. High blood pressure forces your heart to work harder to pump blood. For most people, a normal blood pressure is less than 120/80. Making healthy choices can help lower blood pressure. If your blood pressure does not get lower with healthy choices, you may need to take medicine. This information is not intended to replace advice given to you by your health care provider. Make sure you discuss any questions you have with your health care provider. Document Revised: 11/10/2020 Document Reviewed: 11/10/2020 Elsevier Patient Education  2024 [employer].

## 2023-10-17 ENCOUNTER — Other Ambulatory Visit: Payer: Self-pay | Admitting: Internal Medicine

## 2023-10-17 DIAGNOSIS — F410 Panic disorder [episodic paroxysmal anxiety] without agoraphobia: Secondary | ICD-10-CM

## 2023-10-18 NOTE — Telephone Encounter (Signed)
 Requested medication (s) are due for refill today: yes  Requested medication (s) are on the active medication list: yes  Last refill:  09/20/23 #90   Future visit scheduled: yes  Notes to clinic:  MEd not delegated to NT to refill   Requested Prescriptions  Pending Prescriptions Disp Refills   clonazePAM  (KLONOPIN ) 0.5 MG tablet [Pharmacy Med Name: CLONAZEPAM  0.5MG  TABLETS] 90 tablet     Sig: TAKE 1 TABLET BY MOUTH EVERY MORNING AND EVERY AFTERNOON AND EVERY EVENING     Not Delegated - Psychiatry: Anxiolytics/Hypnotics 2 Failed - 10/18/2023  7:37 AM      Failed - This refill cannot be delegated      Failed - Urine Drug Screen completed in last 360 days      Passed - Patient is not pregnant      Passed - Valid encounter within last 6 months    Recent Outpatient Visits           3 days ago Periumbilical hernia   [facility] [place], [place] W, NP   4 months ago Type 2 diabetes mellitus without complication, without long-term current use of insulin  [facility])   [facility] [place], [place] W, NP   5 months ago Acute cystitis without hematuria    [facility] [place], Angeline ORN, [place]

## 2023-10-21 ENCOUNTER — Encounter: Payer: Self-pay | Admitting: Internal Medicine

## 2023-10-23 ENCOUNTER — Ambulatory Visit: Admitting: Neurology

## 2023-10-25 ENCOUNTER — Encounter: Payer: Self-pay | Admitting: Internal Medicine

## 2023-10-25 ENCOUNTER — Ambulatory Visit (INDEPENDENT_AMBULATORY_CARE_PROVIDER_SITE_OTHER): Admitting: Internal Medicine

## 2023-10-25 VITALS — BP 149/92 | Ht 63.5 in | Wt 190.0 lb

## 2023-10-25 DIAGNOSIS — I10 Essential (primary) hypertension: Secondary | ICD-10-CM

## 2023-10-25 MED ORDER — LISINOPRIL 20 MG PO TABS
20.0000 mg | ORAL_TABLET | Freq: Every day | ORAL | 1 refills | Status: AC
Start: 2023-10-25 — End: ?

## 2023-10-25 NOTE — Progress Notes (Signed)
 Subjective:    Patient ID: [name], female    DOB: 06/04/1950, 73 y.o.   MRN: [id number]  HPI  Discussed the use of AI scribe software for clinical note transcription with the patient, who gave verbal consent to proceed.  [name] [name] is a 73 year old female with hypertension who presents for blood pressure management.  Her blood pressure was measured at 165/95 mmHg by a visiting nurse using a wrist monitor, which is higher than usual. She has been taking lisinopril  10 mg daily and diltiazem  120 mg daily, but her blood pressure remains elevated despite this regimen.  She confirms adherence to her medication regimen, taking lisinopril  10 mg and diltiazem  120 mg daily.    Review of Systems     Past Medical History:  Diagnosis Date   Anxiety    Arthritis    Chronic back pain    COPD (chronic obstructive pulmonary disease) (HCC)    Depression    GERD (gastroesophageal reflux disease)    Hypertension    Hypothyroidism    Neuromuscular disorder (HCC)    Pneumonia    RECENT X 3   PONV (postoperative nausea and vomiting)    Psoriasis    Thyroid  disease     Current Outpatient Medications  Medication Sig Dispense Refill   acetaminophen  (TYLENOL ) 500 MG tablet Take 500 mg by mouth every 6 (six) hours as needed for moderate pain.      albuterol  (VENTOLIN  HFA) 108 (90 Base) MCG/ACT inhaler INHALE 2 PUFFS BY MOUTH EVERY 6 HOURS AS NEEDED FOR WHEEZING OR SHORTNESS OF BREATH 8.5 g 2   citalopram  (CELEXA ) 20 MG tablet TAKE 1 TABLET(20 MG) BY MOUTH DAILY 90 tablet 0   clonazePAM  (KLONOPIN ) 0.5 MG tablet TAKE 1 TABLET BY MOUTH EVERY MORNING AND EVERY AFTERNOON AND EVERY EVENING 90 tablet 0   diltiazem  (CARDIZEM  LA) 120 MG 24 hr tablet Take 1 tablet (120 mg total) by mouth daily.     fenofibrate  (TRICOR ) 145 MG tablet TAKE 1 TABLET(145 MG) BY MOUTH DAILY 90 tablet 0   fluticasone  furoate-vilanterol (BREO ELLIPTA ) 200-25 MCG/ACT AEPB Inhale 1 puff into the lungs daily. 1 each 11    levothyroxine  (SYNTHROID ) 112 MCG tablet TAKE 1 TABLET(112 MCG) BY MOUTH DAILY 90 tablet 0   lisinopril  (ZESTRIL ) 10 MG tablet Take 1 tablet (10 mg total) by mouth daily. 90 tablet 1   metFORMIN  (GLUCOPHAGE -XR) 500 MG 24 hr tablet TAKE 1 TABLET(500 MG) BY MOUTH DAILY WITH BREAKFAST 90 tablet 0   Misc. Devices (QUAD CANE/SMALL BASE) MISC 1 Device by Does not apply route daily. 1 each 0   OXYGEN  Inhale 2 L/min into the lungs as needed.     triamcinolone  ointment (KENALOG ) 0.1 % Apply 1 Application topically as needed.     No current facility-administered medications for this visit.    Allergies  Allergen Reactions   Ciprofloxacin Hives and Diarrhea   Gabapentin  Other (See Comments)    Hallucination   Trazodone  And Nefazodone Other (See Comments)    Altered Mental Status    Trazodone  Other (See Comments)   Mucinex  [Guaifenesin  Er] Nausea Only   Oxycodone  Diarrhea   Sulfa Antibiotics Rash    Throat, mouth Throat, mouth    Family History  Problem Relation Age of Onset   Heart disease Mother    Breast cancer Mother    Heart attack Mother    COPD Father    Stroke Paternal Uncle    Kidney  disease Sister    Breast cancer Cousin    Breast cancer Cousin    Heart attack Maternal Aunt    Stroke Paternal Grandfather    Kidney cancer Neg Hx    Bladder Cancer Neg Hx     Social History   Socioeconomic History   Marital status: Widowed    Spouse name: Not on file   Number of children: Not on file   Years of education: Not on file   Highest education level: Not on file  Occupational History   Occupation: retired  Tobacco Use   Smoking status: Former    Current packs/day: 0.00    Average packs/day: 1 pack/day for 35.0 years (35.0 ttl pk-yrs)    Types: Cigarettes    Start date: 04/19/2018    Quit date: 11/16/2020    Years since quitting: 2.9   Smokeless tobacco: Never   Tobacco comments:    Previously smoked 2 ppd.   Vaping Use   Vaping status: Never Used  Substance and  Sexual Activity   Alcohol  use: No   Drug use: No   Sexual activity: Not Currently    Birth control/protection: Surgical  Other Topics Concern   Not on file  Social History Narrative   Not on file   Social Drivers of Health   Financial Resource Strain: Low Risk  (07/09/2023)   Overall Financial Resource Strain (CARDIA)    Difficulty of Paying Living Expenses: Not very hard  Food Insecurity: No Food Insecurity (07/09/2023)   Hunger Vital Sign    Worried About Running Out of Food in the Last Year: Never true    Ran Out of Food in the Last Year: Never true  Transportation Needs: No Transportation Needs (07/09/2023)   PRAPARE - Administrator, Civil Service (Medical): No    Lack of Transportation (Non-Medical): No  Physical Activity: Insufficiently Active (05/03/2023)   Exercise Vital Sign    Days of Exercise per Week: 2 days    Minutes of Exercise per Session: 20 min  Stress: No Stress Concern Present (07/09/2023)   [employer] of Occupational Health - Occupational Stress Questionnaire    Feeling of Stress : Not at all  Social Connections: Moderately Integrated (07/09/2023)   Social Connection and Isolation Panel    Frequency of Communication with Friends and Family: More than three times a week    Frequency of Social Gatherings with Friends and Family: More than three times a week    Attends Religious Services: More than 4 times per year    Active Member of [employer] or Organizations: Yes    Attends Banker Meetings: More than 4 times per year    Marital Status: Widowed  Intimate Partner Violence: Not At Risk (07/09/2023)   Humiliation, Afraid, Rape, and Kick questionnaire    Fear of Current or Ex-Partner: No    Emotionally Abused: No    Physically Abused: No    Sexually Abused: No     Constitutional: Patient reports headache.  Denies fever, malaise, fatigue, or abrupt weight changes.  HEENT: Denies eye pain, eye redness, ear pain, ringing in the ears, wax  buildup, runny nose, nasal congestion, bloody nose, or sore throat. Respiratory: Patient reports chronic cough and shortness of breath.  Denies difficulty breathing, or sputum production.   Cardiovascular: Denies chest pain, chest tightness, palpitations or swelling in the hands or feet.  Neurological: Patient reports insomnia, paresthesia of left lower extremity.  Denies dizziness, difficulty with memory, difficulty  with speech or problems with balance and coordination.    No other specific complaints in a complete review of systems (except as listed in HPI above).  Objective:   Physical Exam BP (!) 149/92   Ht 5' 3.5 (1.613 m)   Wt 190 lb (86.2 kg)   BMI 33.13 kg/m     Wt Readings from Last 3 Encounters:  10/15/23 190 lb 9.6 oz (86.5 kg)  08/08/23 195 lb (88.5 kg)  07/27/23 180 lb (81.6 kg)    General: Appears her stated age, obese, in NAD. Skin: Warm, dry and intact.  HEENT: Head: normal shape and size, no sinus tenderness noted; Eyes: sclera white, no icterus, conjunctiva pink, PERRLA and EOMs intact;  Cardiovascular: Normal rate and rhythm. S1,S2 noted.  No murmur, rubs or gallops noted.  Pulmonary/Chest: Normal effort and positive vesicular breath sounds. No respiratory distress. No wheezes, rales or ronchi noted.  Musculoskeletal: Gait slow and steady with use of rolling walker. Neurological: Alert and oriented.   BMET    Component Value Date/Time   NA 138 07/27/2023 1025   NA 142 06/24/2015 1102   NA 135 (L) 02/02/2013 1021   K 3.6 07/27/2023 1025   K 3.7 02/02/2013 1021   CL 102 07/27/2023 1025   CL 103 02/02/2013 1021   CO2 26 07/27/2023 1025   CO2 30 02/02/2013 1021   GLUCOSE 134 (H) 07/27/2023 1025   GLUCOSE 152 (H) 02/02/2013 1021   BUN 17 07/27/2023 1025   BUN 12 06/24/2015 1102   BUN 8 02/02/2013 1021   CREATININE 0.68 07/27/2023 1025   CREATININE 0.85 06/07/2023 1051   CALCIUM  9.3 07/27/2023 1025   CALCIUM  9.9 02/02/2013 1021   GFRNONAA >60  07/27/2023 1025   GFRNONAA 72 12/03/2019 0947   GFRAA 84 12/03/2019 0947    Lipid Panel     Component Value Date/Time   CHOL 84 06/07/2023 1051   CHOL 205 (H) 02/03/2015 1022   TRIG 117 06/07/2023 1051   HDL 36 (L) 06/07/2023 1051   HDL 36 (L) 02/03/2015 1022   CHOLHDL 2.3 06/07/2023 1051   LDLCALC 28 06/07/2023 1051    CBC    Component Value Date/Time   WBC 7.2 07/27/2023 1025   RBC 4.45 07/27/2023 1025   HGB 12.9 07/27/2023 1025   HGB 15.6 06/24/2015 1102   HCT 40.7 07/27/2023 1025   HCT 46.6 06/24/2015 1102   PLT 251 07/27/2023 1025   PLT 307 06/24/2015 1102   MCV 91.5 07/27/2023 1025   MCV 93 06/24/2015 1102   MCV 90 02/02/2013 1021   MCH 29.0 07/27/2023 1025   MCHC 31.7 07/27/2023 1025   RDW 14.0 07/27/2023 1025   RDW 13.3 06/24/2015 1102   RDW 13.2 02/02/2013 1021   LYMPHSABS 2.0 07/27/2023 1025   LYMPHSABS 2.3 06/24/2015 1102   MONOABS 0.7 07/27/2023 1025   EOSABS 0.5 07/27/2023 1025   EOSABS 0.3 06/24/2015 1102   BASOSABS 0.1 07/27/2023 1025   BASOSABS 0.1 06/24/2015 1102    Hgb A1C Lab Results  Component Value Date   HGBA1C 7.3 (H) 06/07/2023            Assessment & Plan:   Assessment and Plan    Hypertension Hypertension remains uncontrolled with current medication. Blood pressure at 165 mmHg indicates severe hypertension. - Increase lisinopril  to 20 mg daily by taking two 10 mg tablets. - Continue diltiazem  120 mg daily. - Monitor blood pressure at home. -Reinforced DASH diet and exercise for  weight loss - Re-evaluate blood pressure control at next appointment in October.       RTC in 2 months for your annual exam Angeline Laura, NP

## 2023-10-25 NOTE — Patient Instructions (Signed)
 Hypertension, Adult Hypertension is another name for high blood pressure. High blood pressure forces your heart to work harder to pump blood. This can cause problems over time. There are two numbers in a blood pressure reading. There is a top number (systolic) over a bottom number (diastolic). It is best to have a blood pressure that is below 120/80. What are the causes? The cause of this condition is not known. Some other conditions can lead to high blood pressure. What increases the risk? Some lifestyle factors can make you more likely to develop high blood pressure: Smoking. Not getting enough exercise or physical activity. Being overweight. Having too much fat, sugar, calories, or salt (sodium) in your diet. Drinking too much alcohol. Other risk factors include: Having any of these conditions: Heart disease. Diabetes. High cholesterol. Kidney disease. Obstructive sleep apnea. Having a family history of high blood pressure and high cholesterol. Age. The risk increases with age. Stress. What are the signs or symptoms? High blood pressure may not cause symptoms. Very high blood pressure (hypertensive crisis) may cause: Headache. Fast or uneven heartbeats (palpitations). Shortness of breath. Nosebleed. Vomiting or feeling like you may vomit (nauseous). Changes in how you see. Very bad chest pain. Feeling dizzy. Seizures. How is this treated? This condition is treated by making healthy lifestyle changes, such as: Eating healthy foods. Exercising more. Drinking less alcohol. Your doctor may prescribe medicine if lifestyle changes do not help enough and if: Your top number is above 130. Your bottom number is above 80. Your personal target blood pressure may vary. Follow these instructions at home: Eating and drinking  If told, follow the DASH eating plan. To follow this plan: Fill one half of your plate at each meal with fruits and vegetables. Fill one fourth of your plate  at each meal with whole grains. Whole grains include whole-wheat pasta, brown rice, and whole-grain bread. Eat or drink low-fat dairy products, such as skim milk or low-fat yogurt. Fill one fourth of your plate at each meal with low-fat (lean) proteins. Low-fat proteins include fish, chicken without skin, eggs, beans, and tofu. Avoid fatty meat, cured and processed meat, or chicken with skin. Avoid pre-made or processed food. Limit the amount of salt in your diet to less than 1,500 mg each day. Do not drink alcohol if: Your doctor tells you not to drink. You are pregnant, may be pregnant, or are planning to become pregnant. If you drink alcohol: Limit how much you have to: 0-1 drink a day for women. 0-2 drinks a day for men. Know how much alcohol is in your drink. In the [place] one drink equals one 12 oz bottle of beer (355 mL), one 5 oz glass of wine (148 mL), or one 1 oz glass of hard liquor (44 mL). Lifestyle  Work with your doctor to stay at a healthy weight or to lose weight. Ask your doctor what the best weight is for you. Get at least 30 minutes of exercise that causes your heart to beat faster (aerobic exercise) most days of the week. This may include walking, swimming, or biking. Get at least 30 minutes of exercise that strengthens your muscles (resistance exercise) at least 3 days a week. This may include lifting weights or doing Pilates. Do not smoke or use any products that contain nicotine or tobacco. If you need help quitting, ask your doctor. Check your blood pressure at home as told by your doctor. Keep all follow-up visits. Medicines Take over-the-counter and prescription medicines  only as told by your doctor. Follow directions carefully. Do not skip doses of blood pressure medicine. The medicine does not work as well if you skip doses. Skipping doses also puts you at risk for problems. Ask your doctor about side effects or reactions to medicines that you should watch  for. Contact a doctor if: You think you are having a reaction to the medicine you are taking. You have headaches that keep coming back. You feel dizzy. You have swelling in your ankles. You have trouble with your vision. Get help right away if: You get a very bad headache. You start to feel mixed up (confused). You feel weak or numb. You feel faint. You have very bad pain in your: Chest. Belly (abdomen). You vomit more than once. You have trouble breathing. These symptoms may be an emergency. Get help right away. Call 911. Do not wait to see if the symptoms will go away. Do not drive yourself to the hospital. Summary Hypertension is another name for high blood pressure. High blood pressure forces your heart to work harder to pump blood. For most people, a normal blood pressure is less than 120/80. Making healthy choices can help lower blood pressure. If your blood pressure does not get lower with healthy choices, you may need to take medicine. This information is not intended to replace advice given to you by your health care provider. Make sure you discuss any questions you have with your health care provider. Document Revised: 11/10/2020 Document Reviewed: 11/10/2020 Elsevier Patient Education  2024 [employer].

## 2023-10-28 ENCOUNTER — Ambulatory Visit: Admitting: Family Medicine

## 2023-10-29 ENCOUNTER — Encounter: Payer: Self-pay | Admitting: General Surgery

## 2023-10-29 ENCOUNTER — Telehealth: Payer: Self-pay

## 2023-10-29 ENCOUNTER — Ambulatory Visit: Admitting: General Surgery

## 2023-10-29 VITALS — BP 147/65 | HR 95 | Temp 98.1°F | Ht 63.5 in | Wt 189.6 lb

## 2023-10-29 DIAGNOSIS — K429 Umbilical hernia without obstruction or gangrene: Secondary | ICD-10-CM

## 2023-10-29 DIAGNOSIS — K420 Umbilical hernia with obstruction, without gangrene: Secondary | ICD-10-CM

## 2023-10-29 NOTE — Telephone Encounter (Signed)
Faxed medical clearance to [name], NP at [phone]

## 2023-10-29 NOTE — Patient Instructions (Signed)
 You have requested for your Umbilical Hernia be repaired. This will be scheduled with Dr. Cornel Diesel at [facility].   If you are on any injectable weight loss medication, you will need to stop taking your GLP-1 injectable (weight loss) medications 8 days before your surgery to avoid any complications with anesthesia.   Please see your (blue)pre-care sheet for information. Our surgery scheduler will call you to verify surgery date and to go over information.   You will need to arrange to be off work for 1-2 weeks but will have to have a lifting restriction of no more than 15 lbs for 6 weeks following your surgery. If you have FMLA or disability paperwork that needs filled out you may drop this off at our office or this can be faxed to [phone].     Umbilical Hernia, Adult A hernia is a bulge of tissue that pushes through an opening between muscles. An umbilical hernia happens in the abdomen, near the belly button (umbilicus). The hernia may contain tissues from the small intestine, large intestine, or fatty tissue covering the intestines (omentum). Umbilical hernias in adults tend to get worse over time, and they require surgical treatment. There are several types of umbilical hernias. You may have: A hernia located just above or below the umbilicus (indirect hernia). This is the most common type of umbilical hernia in adults. A hernia that forms through an opening formed by the umbilicus (direct hernia). A hernia that comes and goes (reducible hernia). A reducible hernia may be visible only when you strain, lift something heavy, or cough. This type of hernia can be pushed back into the abdomen (reduced). A hernia that traps abdominal tissue inside the hernia (incarcerated hernia). This type of hernia cannot be reduced. A hernia that cuts off blood flow to the tissues inside the hernia (strangulated hernia). The tissues can start to die if this happens. This type of hernia  requires emergency treatment.  What are the causes? An umbilical hernia happens when tissue inside the abdomen presses on a weak area of the abdominal muscles. What increases the risk? You may have a greater risk of this condition if you: Are obese. Have had several pregnancies. Have a buildup of fluid inside your abdomen (ascites). Have had surgery that weakens the abdominal muscles.  What are the signs or symptoms? The main symptom of this condition is a painless bulge at or near the belly button. A reducible hernia may be visible only when you strain, lift something heavy, or cough. Other symptoms may include: Dull pain. A feeling of pressure.  Symptoms of a strangulated hernia may include: Pain that gets increasingly worse. Nausea and vomiting. Pain when pressing on the hernia. Skin over the hernia becoming red or purple. Constipation. Blood in the stool.  How is this diagnosed? This condition may be diagnosed based on: A physical exam. You may be asked to cough or strain while standing. These actions increase the pressure inside your abdomen and force the hernia through the opening in your muscles. Your health care provider may try to reduce the hernia by pressing on it. Your symptoms and medical history.  How is this treated? Surgery is the only treatment for an umbilical hernia. Surgery for a strangulated hernia is done as soon as possible. If you have a small hernia that is not incarcerated, you may need to lose weight before having surgery. Follow these instructions at home: Lose weight, if told by your health care provider.  Do not try to push the hernia back in. Watch your hernia for any changes in color or size. Tell your health care provider if any changes occur. You may need to avoid activities that increase pressure on your hernia. Do not lift anything that is heavier than 10 lb (4.5 kg) until your health care provider says that this is safe. Take over-the-counter  and prescription medicines only as told by your health care provider. Keep all follow-up visits as told by your health care provider. This is important. Contact a health care provider if: Your hernia gets larger. Your hernia becomes painful. Get help right away if: You develop sudden, severe pain near the area of your hernia. You have pain as well as nausea or vomiting. You have pain and the skin over your hernia changes color. You develop a fever. This information is not intended to replace advice given to you by your health care provider. Make sure you discuss any questions you have with your health care provider. Document Released: 06/24/2015 Document Revised: 09/25/2015 Document Reviewed: 06/24/2015 Elsevier Interactive Patient Education  [employer].

## 2023-10-30 ENCOUNTER — Telehealth: Payer: Self-pay | Admitting: Internal Medicine

## 2023-10-30 NOTE — Telephone Encounter (Signed)
 Patient is scheduled

## 2023-10-30 NOTE — Telephone Encounter (Signed)
 [facility] for [facility] repair.  She will need appointment for surgical clearance so that we can mainly check an A1c and recheck her blood pressure.  Please have patient schedule appointment.

## 2023-10-31 ENCOUNTER — Telehealth: Payer: Self-pay | Admitting: General Surgery

## 2023-10-31 NOTE — Telephone Encounter (Signed)
 Patient has been advised of Pre-Admission date/time, and Surgery date at [facility].  Surgery Date: 11/25/23 Preadmission Testing Date: 11/18/23 (phone 8a-1p)  Patient informed of the scheduling process and surgery information given at time of office visit.  Patient has been made aware to call [phone], between 1-3:00pm the day before surgery, to find out what time to arrive for surgery.

## 2023-11-03 NOTE — Progress Notes (Signed)
 Patient ID: [name], female   DOB: 12/26/1950, 73 y.o.   MRN: [id number] CC: Umbilical Hernia History of Present Illness [name] is a 73 y.o. female with past medical history significant for COPD who presents in consultation for umbilical hernia.  The patient reports that she went to the ED for an unrelated complaint this summer and had a CT scan that showed an umbilical hernia.  She has noticed a bulge in her umbilicus.  She says she intermittently will get pain from it.  She denies any nausea or vomiting or obstructive symptoms.  She does report that it does not reduce on its own.  She denies any overlying erythema.  She denies previous hernia repair.  Past Medical History Past Medical History:  Diagnosis Date   Anxiety    Arthritis    Chronic back pain    COPD (chronic obstructive pulmonary disease) (HCC)    Depression    GERD (gastroesophageal reflux disease)    Hypertension    Hypothyroidism    Neuromuscular disorder (HCC)    Pneumonia    RECENT X 3   PONV (postoperative nausea and vomiting)    Psoriasis    Thyroid  disease        Past Surgical History:  Procedure Laterality Date   ABDOMINAL HYSTERECTOMY     heavy bleeding   bladder stimulator     CARPAL TUNNEL RELEASE     CATARACT EXTRACTION W/PHACO Right 03/07/2015   Procedure: CATARACT EXTRACTION PHACO AND INTRAOCULAR LENS PLACEMENT (IOC);  Surgeon: Steven Dingeldein, MD;  Location: [facility] ORS;  Service: Ophthalmology;  Laterality: Right;  US  01:14 AP% 26.4 CDE 33.04 fluid pack lot # [id number]   CATARACT EXTRACTION W/PHACO Left 03/21/2015   Procedure: CATARACT EXTRACTION PHACO AND INTRAOCULAR LENS PLACEMENT (IOC);  Surgeon: Steven Dingeldein, MD;  Location: [facility] ORS;  Service: Ophthalmology;  Laterality: Left;  US  01:21 AP% 25.6 CDE 39.78 fluid pack lot # [id number]   HAND SURGERY Right    INTERSTIM IMPLANT REMOVAL N/A 09/07/2015   Procedure: REMOVAL OF RENNA IMPLANT;  Surgeon: Glendia Elizabeth, MD;  Location: [facility]  ORS;  Service: Urology;  Laterality: N/A;   LUMBAR LAMINECTOMY/DECOMPRESSION MICRODISCECTOMY N/A 02/28/2023   Procedure: L3-4 LAMINECTOMY, L4-5 LAMINECTOMY AND DISCECTOMY OF LEFT SIDE, L5-S1 LAMINECTOMY;  Surgeon: Claudene Penne ORN, MD;  Location: [facility] ORS;  Service: Neurosurgery;  Laterality: N/A;   OOPHORECTOMY Right    ovarian cyst    Allergies  Allergen Reactions   Ciprofloxacin Hives and Diarrhea   Gabapentin  Other (See Comments)    Hallucination   Trazodone  And Nefazodone Other (See Comments)    Altered Mental Status    Trazodone  Other (See Comments)   Mucinex  [Guaifenesin  Er] Nausea Only   Oxycodone  Diarrhea   Sulfa Antibiotics Rash    Throat, mouth Throat, mouth    Current Outpatient Medications  Medication Sig Dispense Refill   acetaminophen  (TYLENOL ) 500 MG tablet Take 500 mg by mouth every 6 (six) hours as needed for moderate pain.      albuterol  (VENTOLIN  HFA) 108 (90 Base) MCG/ACT inhaler INHALE 2 PUFFS BY MOUTH EVERY 6 HOURS AS NEEDED FOR WHEEZING OR SHORTNESS OF BREATH 8.5 g 2   citalopram  (CELEXA ) 20 MG tablet TAKE 1 TABLET(20 MG) BY MOUTH DAILY 90 tablet 0   clonazePAM  (KLONOPIN ) 0.5 MG tablet TAKE 1 TABLET BY MOUTH EVERY MORNING AND EVERY AFTERNOON AND EVERY EVENING 90 tablet 0   diltiazem  (CARDIZEM  LA) 120 MG 24 hr tablet Take 1 tablet (  120 mg total) by mouth daily.     fenofibrate  (TRICOR ) 145 MG tablet TAKE 1 TABLET(145 MG) BY MOUTH DAILY 90 tablet 0   fluticasone  furoate-vilanterol (BREO ELLIPTA ) 200-25 MCG/ACT AEPB Inhale 1 puff into the lungs daily. 1 each 11   levothyroxine  (SYNTHROID ) 112 MCG tablet TAKE 1 TABLET(112 MCG) BY MOUTH DAILY 90 tablet 0   lisinopril  (ZESTRIL ) 20 MG tablet Take 1 tablet (20 mg total) by mouth daily. 90 tablet 1   metFORMIN  (GLUCOPHAGE -XR) 500 MG 24 hr tablet TAKE 1 TABLET(500 MG) BY MOUTH DAILY WITH BREAKFAST 90 tablet 0   Misc. Devices (QUAD CANE/SMALL BASE) MISC 1 Device by Does not apply route daily. 1 each 0   OXYGEN  Inhale 2  L/min into the lungs as needed.     triamcinolone  ointment (KENALOG ) 0.1 % Apply 1 Application topically as needed.     No current facility-administered medications for this visit.    Family History Family History  Problem Relation Age of Onset   Heart disease Mother    Breast cancer Mother    Heart attack Mother    COPD Father    Stroke Paternal Uncle    Kidney disease Sister    Breast cancer Cousin    Breast cancer Cousin    Heart attack Maternal Aunt    Stroke Paternal Grandfather    Kidney cancer Neg Hx    Bladder Cancer Neg Hx        Social History Social History   Tobacco Use   Smoking status: Former    Current packs/day: 0.00    Average packs/day: 1 pack/day for 35.0 years (35.0 ttl pk-yrs)    Types: Cigarettes    Start date: 04/19/2018    Quit date: 11/16/2020    Years since quitting: 2.9   Smokeless tobacco: Never   Tobacco comments:    Previously smoked 2 ppd.   Vaping Use   Vaping status: Never Used  Substance Use Topics   Alcohol  use: No   Drug use: No        ROS Full ROS of systems performed and is otherwise negative there than what is stated in the HPI  Physical Exam Blood pressure (!) 147/65, pulse 95, temperature 98.1 F (36.7 C), temperature source Oral, height 5' 3.5 (1.613 m), weight 189 lb 9.6 oz (86 kg), SpO2 97%.  Alert and oriented x 3, normal work of breathing room air, regular rate and rhythm, abdomen is soft, nondistended, there is a umbilical hernia with some purple discoloration right over the hernia.  I am unable to completely reduce the hernia but there does not feel like there is any bowel in it and it is soft.  Data Reviewed They do feel we reviewed her CT scan from December.  There is a fat-containing hernia  I have personally reviewed the patient's imaging and medical records.    Assessment    73 year old female with umbilical hernia  Plan    Given that it is chronically incarcerated with fat in it and she is having  some intermittent discomfort I did recommend repair.  I discussed the risk, benefits alternatives of repair including risk of infection, bleeding, damage to underlying viscera as well as recurrence.  Given her body habitus and the size of the hernia I think it would be best repair this robotically with mesh.  Due to the fact that she has COPD and diabetes I do think we should have her undergo medical clearance prior to surgery.  Jayson MALVA Endow

## 2023-11-15 ENCOUNTER — Encounter: Payer: Self-pay | Admitting: Internal Medicine

## 2023-11-15 ENCOUNTER — Other Ambulatory Visit: Payer: Self-pay

## 2023-11-15 ENCOUNTER — Ambulatory Visit (INDEPENDENT_AMBULATORY_CARE_PROVIDER_SITE_OTHER): Admitting: Internal Medicine

## 2023-11-15 VITALS — BP 138/68 | HR 90 | Ht 63.5 in | Wt 188.4 lb

## 2023-11-15 DIAGNOSIS — K429 Umbilical hernia without obstruction or gangrene: Secondary | ICD-10-CM

## 2023-11-15 DIAGNOSIS — E119 Type 2 diabetes mellitus without complications: Secondary | ICD-10-CM | POA: Diagnosis not present

## 2023-11-15 DIAGNOSIS — Z7984 Long term (current) use of oral hypoglycemic drugs: Secondary | ICD-10-CM

## 2023-11-15 DIAGNOSIS — Z01818 Encounter for other preprocedural examination: Secondary | ICD-10-CM | POA: Diagnosis not present

## 2023-11-15 DIAGNOSIS — F410 Panic disorder [episodic paroxysmal anxiety] without agoraphobia: Secondary | ICD-10-CM

## 2023-11-15 MED ORDER — CLONAZEPAM 0.5 MG PO TABS
ORAL_TABLET | ORAL | 0 refills | Status: AC
Start: 2023-11-15 — End: ?

## 2023-11-15 NOTE — Patient Instructions (Signed)
 Umbilical Hernia, Adult  A hernia is a lump of tissue that pushes through an opening in the muscles. An umbilical hernia happens in the belly, near the belly button. The hernia may contain tissues from the small or large intestine. It may also have fatty tissue that covers the intestines. Umbilical hernias in adults may get worse over time. They need to be treated with surgery. There are several types of umbilical hernias. They include: Indirect hernia. This occurs just above or below the belly button. It's the most common type of umbilical hernia in adults. Direct hernia. This type occurs in an opening that's formed by the belly button. Reducible hernia. This hernia comes and goes. You may see it only when you strain, cough, or lift something heavy. This type of hernia can be pushed back into the belly (reduced). Incarcerated hernia. This traps the hernia in the wall of the belly. This type of hernia can't be pushed back into the belly. It can cause a strangulated hernia. Strangulated hernia. This hernia cuts off blood flow to the tissues inside the hernia. The tissues can die if this happens. This type of hernia must be treated right away. What are the causes? An umbilical hernia happens when tissue inside the belly pushes through an opening in the muscles of the belly. What increases the risk? You're more likely to get this hernia if: You strain while lifting or pushing heavy objects. You've had several pregnancies. You have a condition that puts pressure on your belly, and you've had it for a long time. These include: Obesity. A buildup of fluid inside your belly. Vomiting or coughing all the time. Trouble pooping (constipation). You've had surgery that weakened the muscles in the belly. What are the signs or symptoms? The main symptom of this condition is a bulge at the belly button or near it. The bulge does not cause pain. Other symptoms depend on the type of hernia you have. A  reducible hernia may be seen only when you strain, cough, or lift something heavy. Other symptoms may include: Dull pain. A feeling of pressure. An incarcerated hernia may cause very bad pain. Also, you may: Vomit or feel like you may vomit. Not be able to pass gas. A strangulated hernia may cause: Pain that gets worse and worse. Vomiting, or feeling like you may vomit. Pain when you press on the hernia. Change of color on the skin over the hernia. The skin may become red or purple. Trouble pooping. Blood in the poop. How is this diagnosed? This condition may be diagnosed based on: Your symptoms and medical history. A physical exam. You may be asked to cough or strain while standing. These actions will put pressure inside your belly. The pressure can force the hernia through the opening in your muscles. Your health care provider may try to push the hernia back into your belly (reduce). How is this treated? Surgery is the only treatment for an umbilical hernia. Surgery for a strangulated hernia must be done right away. If you have a small hernia that's not incarcerated, you may need to lose weight before the surgery is done. Follow these instructions at home: Managing constipation You may need to take these actions to prevent trouble pooping. This will help to prevent straining. Drink enough fluid to keep your pee (urine) pale yellow. Take over-the-counter or prescription medicines. Eat foods that are high in fiber, such as beans, whole grains, and fresh fruits and vegetables. Limit foods that are high in  fat and sugars, such as fried or sweet foods. General instructions Do not try to push the hernia back in. Lose weight, if told by your provider. Watch your hernia for any changes in color or size. Tell your provider if any changes occur. You may need to avoid activities that put pressure on your hernia. You may have to avoid lifting. Ask your provider how much you can safely  lift. Take over-the-counter and prescription medicines only as told by your provider. Contact a health care provider if: Your hernia gets larger or feels hard. Your hernia becomes painful. You get a fever or chills. Get help right away if: You get very bad pain near the area of the hernia, and the pain comes on suddenly. You have pain and you vomit or feel like you may vomit. The skin over your hernia changes color. These symptoms may be an emergency. Get help right away. Call 911. Do not wait to see if the symptoms go away. Do not drive yourself to the hospital. This information is not intended to replace advice given to you by your health care provider. Make sure you discuss any questions you have with your health care provider. Document Revised: 05/15/2022 Document Reviewed: 05/15/2022 Elsevier Patient Education  2024 [employer].

## 2023-11-15 NOTE — Progress Notes (Signed)
 Subjective:    Patient ID: [name], female    DOB: [birth year], 73 y.o.   MRN: [id number]  HPI  Discussed the use of AI scribe software for clinical note transcription with the patient, who gave verbal consent to proceed.  [name] is a 73 year old female who presents for preoperative clearance for a peri-umbilical hernia repair.  This is scheduled on October 20 under general anesthesia.  She experiences localized pain around her navel due to the hernia. No associated bowel problems are reported.  She has not had any increased pain or difficulty with her bowels.    She does have a history of DM2, her last A1c was 7.3, 06/2023.  She also has a history of COPD but symptoms are well-controlled on Breo and albuterol .   Review of Systems     Past Medical History:  Diagnosis Date   Anxiety    Arthritis    Chronic back pain    COPD (chronic obstructive pulmonary disease) (HCC)    Depression    GERD (gastroesophageal reflux disease)    Hypertension    Hypothyroidism    Neuromuscular disorder (HCC)    Pneumonia    RECENT X 3   PONV (postoperative nausea and vomiting)    Psoriasis    Thyroid  disease     Current Outpatient Medications  Medication Sig Dispense Refill   acetaminophen  (TYLENOL ) 500 MG tablet Take 500 mg by mouth every 6 (six) hours as needed for moderate pain.      albuterol  (VENTOLIN  HFA) 108 (90 Base) MCG/ACT inhaler INHALE 2 PUFFS BY MOUTH EVERY 6 HOURS AS NEEDED FOR WHEEZING OR SHORTNESS OF BREATH 8.5 g 2   citalopram  (CELEXA ) 20 MG tablet TAKE 1 TABLET(20 MG) BY MOUTH DAILY 90 tablet 0   clonazePAM  (KLONOPIN ) 0.5 MG tablet TAKE 1 TABLET BY MOUTH EVERY MORNING AND EVERY AFTERNOON AND EVERY EVENING 90 tablet 0   diltiazem  (CARDIZEM  LA) 120 MG 24 hr tablet Take 1 tablet (120 mg total) by mouth daily.     fenofibrate  (TRICOR ) 145 MG tablet TAKE 1 TABLET(145 MG) BY MOUTH DAILY 90 tablet 0   fluticasone  furoate-vilanterol (BREO ELLIPTA ) 200-25 MCG/ACT AEPB  Inhale 1 puff into the lungs daily. 1 each 11   levothyroxine  (SYNTHROID ) 112 MCG tablet TAKE 1 TABLET(112 MCG) BY MOUTH DAILY 90 tablet 0   lisinopril  (ZESTRIL ) 20 MG tablet Take 1 tablet (20 mg total) by mouth daily. 90 tablet 1   metFORMIN  (GLUCOPHAGE -XR) 500 MG 24 hr tablet TAKE 1 TABLET(500 MG) BY MOUTH DAILY WITH BREAKFAST 90 tablet 0   Misc. Devices (QUAD CANE/SMALL BASE) MISC 1 Device by Does not apply route daily. 1 each 0   OXYGEN  Inhale 2 L/min into the lungs as needed.     triamcinolone  ointment (KENALOG ) 0.1 % Apply 1 Application topically as needed.     No current facility-administered medications for this visit.    Allergies  Allergen Reactions   Ciprofloxacin Hives and Diarrhea   Gabapentin  Other (See Comments)    Hallucination   Trazodone  And Nefazodone Other (See Comments)    Altered Mental Status    Trazodone  Other (See Comments)   Mucinex  [Guaifenesin  Er] Nausea Only   Oxycodone  Diarrhea   Sulfa Antibiotics Rash    Throat, mouth Throat, mouth    Family History  Problem Relation Age of Onset   Heart disease Mother    Breast cancer Mother    Heart attack Mother  COPD Father    Stroke Paternal Uncle    Kidney disease Sister    Breast cancer Cousin    Breast cancer Cousin    Heart attack Maternal Aunt    Stroke Paternal Grandfather    Kidney cancer Neg Hx    Bladder Cancer Neg Hx     Social History   Socioeconomic History   Marital status: Widowed    Spouse name: Not on file   Number of children: Not on file   Years of education: Not on file   Highest education level: Not on file  Occupational History   Occupation: retired  Tobacco Use   Smoking status: Former    Current packs/day: 0.00    Average packs/day: 1 pack/day for 35.0 years (35.0 ttl pk-yrs)    Types: Cigarettes    Start date: 04/19/2018    Quit date: 11/16/2020    Years since quitting: 2.9   Smokeless tobacco: Never   Tobacco comments:    Previously smoked 2 ppd.   Vaping Use    Vaping status: Never Used  Substance and Sexual Activity   Alcohol  use: No   Drug use: No   Sexual activity: Not Currently    Birth control/protection: Surgical  Other Topics Concern   Not on file  Social History Narrative   Not on file   Social Drivers of Health   Financial Resource Strain: Low Risk  (07/09/2023)   Overall Financial Resource Strain (CARDIA)    Difficulty of Paying Living Expenses: Not very hard  Food Insecurity: No Food Insecurity (07/09/2023)   Hunger Vital Sign    Worried About Running Out of Food in the Last Year: Never true    Ran Out of Food in the Last Year: Never true  Transportation Needs: No Transportation Needs (07/09/2023)   PRAPARE - Administrator, Civil Service (Medical): No    Lack of Transportation (Non-Medical): No  Physical Activity: Insufficiently Active (05/03/2023)   Exercise Vital Sign    Days of Exercise per Week: 2 days    Minutes of Exercise per Session: 20 min  Stress: No Stress Concern Present (07/09/2023)   [employer] of Occupational Health - Occupational Stress Questionnaire    Feeling of Stress : Not at all  Social Connections: Moderately Integrated (07/09/2023)   Social Connection and Isolation Panel    Frequency of Communication with Friends and Family: More than three times a week    Frequency of Social Gatherings with Friends and Family: More than three times a week    Attends Religious Services: More than 4 times per year    Active Member of [employer] or Organizations: Yes    Attends Banker Meetings: More than 4 times per year    Marital Status: Widowed  Intimate Partner Violence: Not At Risk (07/09/2023)   Humiliation, Afraid, Rape, and Kick questionnaire    Fear of Current or Ex-Partner: No    Emotionally Abused: No    Physically Abused: No    Sexually Abused: No     Constitutional: Patient reports headache.  Denies fever, malaise, fatigue, or abrupt weight changes.  HEENT: Denies eye pain, eye  redness, ear pain, ringing in the ears, wax buildup, runny nose, nasal congestion, bloody nose, or sore throat. Respiratory: Patient reports chronic cough and shortness of breath (stable).  Denies difficulty breathing, or sputum production.   Cardiovascular: Denies chest pain, chest tightness, palpitations or swelling in the hands or feet.  Gastrointestinal: Patient reports  mass of abdomen.  Denies abdominal pain, bloating, constipation, diarrhea or blood in the stool.  GU: Denies urgency, frequency, pain with urination, burning sensation, blood in urine, odor or discharge. Musculoskeletal: Patient reports joint pain, difficulty with gait.  Denies decrease in range of motion, muscle pain or joint swelling.  Skin: Denies redness, rashes, lesions or ulcercations.  Neurological: Patient reports insomnia, paresthesia of left lower extremity.  Denies dizziness, difficulty with memory, difficulty with speech or problems with balance and coordination.  Psych: Patient has a history of anxiety and depression.  Denies SI/HI.  No other specific complaints in a complete review of systems (except as listed in HPI above).  Objective:   Physical Exam BP 138/68 (BP Location: Left Arm, Patient Position: Sitting, Cuff Size: Normal)   Pulse 90   Ht 5' 3.5 (1.613 m)   Wt 188 lb 6.4 oz (85.5 kg)   SpO2 95%   BMI 32.85 kg/m     Wt Readings from Last 3 Encounters:  10/29/23 189 lb 9.6 oz (86 kg)  10/25/23 190 lb (86.2 kg)  10/15/23 190 lb 9.6 oz (86.5 kg)    General: Appears her stated age, obese, in NAD. Skin: Warm, dry and intact.  HEENT: Head: normal shape and size, no sinus tenderness noted; Eyes: sclera white, no icterus, conjunctiva pink, PERRLA and EOMs intact;  Cardiovascular: Normal rate and rhythm. S1,S2 noted.  No murmur, rubs or gallops noted.  Pulmonary/Chest: Normal effort and positive vesicular breath sounds. No respiratory distress. No wheezes, rales or ronchi noted.  Abdomen:  Periumbilical hernia noted.  Normal bowel sounds. Musculoskeletal: Gait slow and steady with use of rolling walker. Neurological: Alert and oriented.   BMET    Component Value Date/Time   NA 138 07/27/2023 1025   NA 142 06/24/2015 1102   NA 135 (L) 02/02/2013 1021   K 3.6 07/27/2023 1025   K 3.7 02/02/2013 1021   CL 102 07/27/2023 1025   CL 103 02/02/2013 1021   CO2 26 07/27/2023 1025   CO2 30 02/02/2013 1021   GLUCOSE 134 (H) 07/27/2023 1025   GLUCOSE 152 (H) 02/02/2013 1021   BUN 17 07/27/2023 1025   BUN 12 06/24/2015 1102   BUN 8 02/02/2013 1021   CREATININE 0.68 07/27/2023 1025   CREATININE 0.85 06/07/2023 1051   CALCIUM  9.3 07/27/2023 1025   CALCIUM  9.9 02/02/2013 1021   GFRNONAA >60 07/27/2023 1025   GFRNONAA 72 12/03/2019 0947   GFRAA 84 12/03/2019 0947    Lipid Panel     Component Value Date/Time   CHOL 84 06/07/2023 1051   CHOL 205 (H) 02/03/2015 1022   TRIG 117 06/07/2023 1051   HDL 36 (L) 06/07/2023 1051   HDL 36 (L) 02/03/2015 1022   CHOLHDL 2.3 06/07/2023 1051   LDLCALC 28 06/07/2023 1051    CBC    Component Value Date/Time   WBC 7.2 07/27/2023 1025   RBC 4.45 07/27/2023 1025   HGB 12.9 07/27/2023 1025   HGB 15.6 06/24/2015 1102   HCT 40.7 07/27/2023 1025   HCT 46.6 06/24/2015 1102   PLT 251 07/27/2023 1025   PLT 307 06/24/2015 1102   MCV 91.5 07/27/2023 1025   MCV 93 06/24/2015 1102   MCV 90 02/02/2013 1021   MCH 29.0 07/27/2023 1025   MCHC 31.7 07/27/2023 1025   RDW 14.0 07/27/2023 1025   RDW 13.3 06/24/2015 1102   RDW 13.2 02/02/2013 1021   LYMPHSABS 2.0 07/27/2023 1025   LYMPHSABS 2.3 06/24/2015  1102   MONOABS 0.7 07/27/2023 1025   EOSABS 0.5 07/27/2023 1025   EOSABS 0.3 06/24/2015 1102   BASOSABS 0.1 07/27/2023 1025   BASOSABS 0.1 06/24/2015 1102    Hgb A1C Lab Results  Component Value Date   HGBA1C 7.3 (H) 06/07/2023            Assessment & Plan:   Assessment and Plan    Preoperative Evaluation for Umbilical  Hernia Repair Preoperative evaluation for umbilical hernia repair scheduled on November 25, 2023. Hernia causes periumbilical pain. -Indication for ECG: Preoperative clearance -Interpretation for ECG: Normal rate and rhythm, normal QTc -Comparison of ECG: 05/2023, had prolonged QTc at that time - Order CBC, liver and kidney function tests, PTINR, A1c. - Order urinalysis. - Review lab results by Monday, fax clearance to surgeon.  Type 2 Diabetes Mellitus Type 2 diabetes mellitus. A1c required for preoperative evaluation. Surgery may be postponed if A1c exceeds 8. - Check A1c as part of preoperative blood work.       RTC in 3 weeks for your annual exam Angeline Laura, NP

## 2023-11-15 NOTE — Telephone Encounter (Signed)
Requesting refill.  Thanks!

## 2023-11-16 ENCOUNTER — Other Ambulatory Visit: Payer: Self-pay | Admitting: Internal Medicine

## 2023-11-16 LAB — URINALYSIS, ROUTINE W REFLEX MICROSCOPIC
Bacteria, UA: NONE SEEN /HPF
Bilirubin Urine: NEGATIVE
Glucose, UA: NEGATIVE
Hgb urine dipstick: NEGATIVE
Hyaline Cast: NONE SEEN /LPF
Ketones, ur: NEGATIVE
Nitrite: NEGATIVE
Protein, ur: NEGATIVE
Specific Gravity, Urine: 1.013 (ref 1.001–1.035)
pH: 7.5 (ref 5.0–8.0)

## 2023-11-16 LAB — HEMOGLOBIN A1C
Hgb A1c MFr Bld: 7.3 % — ABNORMAL HIGH (ref <5.7)
Mean Plasma Glucose: 163 mg/dL
eAG (mmol/L): 9 mmol/L

## 2023-11-16 LAB — COMPREHENSIVE METABOLIC PANEL WITH GFR
AG Ratio: 1.6 (calc) (ref 1.0–2.5)
ALT: 18 U/L (ref 6–29)
AST: 31 U/L (ref 10–35)
Albumin: 4.2 g/dL (ref 3.6–5.1)
Alkaline phosphatase (APISO): 79 U/L (ref 37–153)
BUN: 13 mg/dL (ref 7–25)
CO2: 32 mmol/L (ref 20–32)
Calcium: 10.1 mg/dL (ref 8.6–10.4)
Chloride: 101 mmol/L (ref 98–110)
Creat: 0.79 mg/dL (ref 0.60–1.00)
Globulin: 2.7 g/dL (ref 1.9–3.7)
Glucose, Bld: 117 mg/dL — ABNORMAL HIGH (ref 65–99)
Potassium: 4.2 mmol/L (ref 3.5–5.3)
Sodium: 140 mmol/L (ref 135–146)
Total Bilirubin: 0.4 mg/dL (ref 0.2–1.2)
Total Protein: 6.9 g/dL (ref 6.1–8.1)
eGFR: 78 mL/min/1.73m2 (ref >=60)

## 2023-11-16 LAB — CBC
HCT: 41.5 % (ref 35.0–45.0)
Hemoglobin: 13 g/dL (ref 11.7–15.5)
MCH: 28.5 pg (ref 27.0–33.0)
MCHC: 31.3 g/dL — ABNORMAL LOW (ref 32.0–36.0)
MCV: 91 fL (ref 80.0–100.0)
MPV: 11.1 fL (ref 7.5–12.5)
Platelets: 309 Thousand/uL (ref 140–400)
RBC: 4.56 Million/uL (ref 3.80–5.10)
RDW: 13.2 % (ref 11.0–15.0)
WBC: 8.1 Thousand/uL (ref 3.8–10.8)

## 2023-11-16 LAB — PROTIME-INR
INR: 1
Prothrombin Time: 10.9 s (ref 9.0–11.5)

## 2023-11-16 LAB — MICROSCOPIC MESSAGE

## 2023-11-18 ENCOUNTER — Encounter
Admission: RE | Admit: 2023-11-18 | Discharge: 2023-11-18 | Disposition: A | Discharge status: Home / Self Care | Source: Ambulatory Visit | Attending: General Surgery | Admitting: General Surgery

## 2023-11-18 ENCOUNTER — Telehealth: Payer: Self-pay

## 2023-11-18 ENCOUNTER — Other Ambulatory Visit: Payer: Self-pay

## 2023-11-18 ENCOUNTER — Ambulatory Visit: Payer: Self-pay | Admitting: Internal Medicine

## 2023-11-18 HISTORY — DX: Prediabetes: R73.03

## 2023-11-18 NOTE — Patient Instructions (Addendum)
 Your procedure is scheduled on: Monday 11/25/23 Report to the Registration Desk on the 1st floor of [facility]. To find out your arrival time, please call [phone] between 1PM - 3PM on: Friday 11/22/23 If your arrival time is 6:00 am, do not arrive before that time as the [facility] entrance doors do not open until 6:00 am.  REMEMBER: Instructions that are not followed completely may result in serious medical risk, up to and including death; or upon the discretion of your surgeon and anesthesiologist your surgery may need to be rescheduled.  Do not eat food or drink any liquids after midnight the night before surgery.  No gum chewing or hard candies.  One week prior to surgery: Stop Anti-inflammatories (NSAIDS) such as Advil , Aleve, Ibuprofen , Motrin , Naproxen, Naprosyn and Aspirin  based products such as Excedrin, Goody's Powder, BC Powder.  You may however, continue to take Tylenol  if needed for pain up until the day of surgery.  Stop ANY OVER THE COUNTER supplements and vitamins until after surgery.  **Follow guidelines for insulin  and diabetes medications.** Do not take your Metformin  Saturday 10/18 or Sunday 10/19  Continue taking all of your other prescription medications up until the day of surgery.  ON THE DAY OF SURGERY ONLY TAKE THESE MEDICATIONS WITH SIPS OF WATER:  Clonazepam  Diltiazem  Fenofibrate  Levothyroxine   Use both of your inhalers on the day of surgery and bring both of them to the hospital.  No Alcohol  for 24 hours before or after surgery.  No Smoking including e-cigarettes for 24 hours before surgery.  No chewable tobacco products for at least 6 hours before surgery.  No nicotine patches on the day of surgery.  Do not use any recreational drugs for at least a week (preferably 2 weeks) before your surgery.  Please be advised that the combination of cocaine and anesthesia may have negative outcomes, up to and including death. If you test  positive for cocaine, your surgery will be cancelled.  On the morning of surgery brush your teeth with toothpaste and water, you may rinse your mouth with mouthwash if you wish. Do not swallow any toothpaste or mouthwash.  Use CHG Soap or wipes as directed on instruction sheet.  Do not wear lotions, powders, or perfumes.   Do not shave body hair from the neck down 48 hours before surgery.  Wear comfortable clothing (specific to your surgery type) to the hospital.  Do not wear jewelry, make-up, hairpins, clips or nail polish.  For welded (permanent) jewelry: bracelets, anklets, waist bands, etc.  Please have this removed prior to surgery.  If it is not removed, there is a chance that hospital personnel will need to cut it off on the day of surgery.  Contact lenses, hearing aids and dentures may not be worn into surgery.  Do not bring valuables to the hospital. [facility] is not responsible for any missing/lost belongings or valuables.   Notify your doctor if there is any change in your medical condition (cold, fever, infection).  After surgery, you can help prevent lung complications by doing breathing exercises.  Take deep breaths and cough every 1-2 hours. Your doctor may order a device called an Incentive Spirometer to help you take deep breaths.  When coughing or sneezing, hold a pillow firmly against your incision with both hands. This is called "splinting." Doing this helps protect your incision. It also decreases belly discomfort.  If you are being admitted to the hospital overnight, leave your suitcase in the  car. After surgery it may be brought to your room.  In case of increased patient census, it may be necessary for you, the patient, to continue your postoperative care in the Same Day Surgery department.  Please call the Pre-admissions Testing Dept. at [phone] if you have any questions about these instructions.  Surgery Visitation Policy:  Patients having  surgery or a procedure may have two visitors.  Children under the age of 50 must have an adult with them who is not the patient.  Inpatient Visitation:    Visiting hours are 7 a.m. to 8 p.m. Up to four visitors are allowed at one time in a patient room. The visitors may rotate out with other people during the day.  One visitor age 7 or older may stay with the patient overnight and must be in the room by 8 p.m.   Merchandiser, retail to address health-related social needs:  [email or web address]                                                                                                             Preparing for Surgery with CHLORHEXIDINE  GLUCONATE (CHG) Soap  Chlorhexidine  Gluconate (CHG) Soap  o An antiseptic cleaner that kills germs and bonds with the skin to continue killing germs even after washing  o Used for showering the night before surgery and morning of surgery  Before surgery, you can play an important role by reducing the number of germs on your skin.  CHG (Chlorhexidine  gluconate) soap is an antiseptic cleanser which kills germs and bonds with the skin to continue killing germs even after washing.  Please do not use if you have an allergy to CHG or antibacterial soaps. If your skin becomes reddened/irritated stop using the CHG.  1. Shower the NIGHT BEFORE SURGERY with CHG soap.  2. If you choose to wash your hair, wash your hair first as usual with your normal shampoo.  3. After shampooing, rinse your hair and body thoroughly to remove the shampoo.  4. Use CHG as you would any other liquid soap. You can apply CHG directly to the skin and wash gently with a clean washcloth.  5. Apply the CHG soap to your body only from the neck down. Do not use on open wounds or open sores. Avoid contact with your eyes, ears, mouth, and genitals (private parts). Wash face and genitals (private parts) with your normal soap.  6. Wash thoroughly, paying special  attention to the area where your surgery will be performed.  7. Thoroughly rinse your body with warm water.  8. Do not shower/wash with your normal soap after using and rinsing off the CHG soap.  9. Do not use lotions, oils, etc., after showering with CHG.  10. Pat yourself dry with a clean towel.  11. Wear clean pajamas to bed the night before surgery.  12. Place clean sheets on your bed the night of your shower and do not sleep with pets.  13. Do not apply any deodorants/lotions/powders.  14. Please wear clean clothes  to the hospital.  15. Remember to brush your teeth with your regular toothpaste.

## 2023-11-18 NOTE — Telephone Encounter (Signed)
 Received medical clearance from Angeline Laura, NP. Pts risk assessment is medium and is optimized for surgery.

## 2023-11-19 NOTE — Telephone Encounter (Signed)
 Requested Prescriptions  Pending Prescriptions Disp Refills   fenofibrate  (TRICOR ) 145 MG tablet [Pharmacy Med Name: FENOFIBRATE  145MG  TABLETS] 90 tablet 0    Sig: TAKE 1 TABLET(145 MG) BY MOUTH DAILY     Cardiovascular:  Antilipid - Fibric Acid Derivatives Failed - 11/19/2023 11:01 AM      Failed - Lipid Panel in normal range within the last 12 months    Cholesterol, Total  Date Value Ref Range Status  02/03/2015 205 (H) 100 - 199 mg/dL Final   Cholesterol  Date Value Ref Range Status  06/07/2023 84 <200 mg/dL Final   LDL Cholesterol (Calc)  Date Value Ref Range Status  06/07/2023 28 mg/dL (calc) Final    Comment:    Reference range: <100 . Desirable range <100 mg/dL for primary prevention;   <70 mg/dL for patients with CHD or diabetic patients  with > or = 2 CHD risk factors. SABRA LDL-C is now calculated using the Martin-Hopkins  calculation, which is a validated novel method providing  better accuracy than the Friedewald equation in the  estimation of LDL-C.  Gladis APPLETHWAITE et al. SANDREA. 7986;689(80): 2061-2068  (http://education.[facility].com/faq/FAQ164)    HDL  Date Value Ref Range Status  06/07/2023 36 (L) > OR = 50 mg/dL Final  87/70/7983 36 (L) >39 mg/dL Final   Triglycerides  Date Value Ref Range Status  06/07/2023 117 <150 mg/dL Final         Passed - ALT in normal range and within 360 days    ALT  Date Value Ref Range Status  11/15/2023 18 6 - 29 U/L Final         Passed - AST in normal range and within 360 days    AST  Date Value Ref Range Status  11/15/2023 31 10 - 35 U/L Final         Passed - Cr in normal range and within 360 days    Creat  Date Value Ref Range Status  11/15/2023 0.79 0.60 - 1.00 mg/dL Final   Creatinine, Urine  Date Value Ref Range Status  06/07/2023 119 20 - 275 mg/dL Final         Passed - HGB in normal range and within 360 days    Hemoglobin  Date Value Ref Range Status  11/15/2023 13.0 11.7 - 15.5 g/dL Final   94/80/7982 84.3 11.1 - 15.9 g/dL Final         Passed - HCT in normal range and within 360 days    HCT  Date Value Ref Range Status  11/15/2023 41.5 35.0 - 45.0 % Final   Hematocrit  Date Value Ref Range Status  06/24/2015 46.6 34.0 - 46.6 % Final         Passed - PLT in normal range and within 360 days    Platelets  Date Value Ref Range Status  11/15/2023 309 140 - 400 Thousand/uL Final  06/24/2015 307 150 - 379 x10E3/uL Final         Passed - WBC in normal range and within 360 days    WBC  Date Value Ref Range Status  11/15/2023 8.1 3.8 - 10.8 Thousand/uL Final         Passed - eGFR is 30 or above and within 360 days    GFR, Est African American  Date Value Ref Range Status  12/03/2019 84 > OR = 60 mL/min/1.43m2 Final   GFR, Est Non African American  Date Value Ref Range Status  12/03/2019 72 >  OR = 60 mL/min/1.47m2 Final   GFR, Estimated  Date Value Ref Range Status  07/27/2023 >60 >60 mL/min Final    Comment:    (NOTE) Calculated using the CKD-EPI Creatinine Equation (2021)    eGFR  Date Value Ref Range Status  11/15/2023 78 > OR = 60 mL/min/1.3m2 Final         Passed - Valid encounter within last 12 months    Recent Outpatient Visits           4 days ago Preoperative clearance   [place] [facility] [place], Angeline ORN, NP   3 weeks ago Benign essential HTN   [facility] [place], Angeline ORN, NP   1 month ago Periumbilical hernia   [place] [facility] [place], [place] W, NP   5 months ago Type 2 diabetes mellitus without complication, without long-term current use of insulin  [facility])   [place] [facility] [place], [place] W, NP   6 months ago Acute cystitis without hematuria   [place] [facility] [place], Angeline ORN, [place]

## 2023-11-25 ENCOUNTER — Ambulatory Visit: Admitting: General Practice

## 2023-11-25 ENCOUNTER — Other Ambulatory Visit: Payer: Self-pay

## 2023-11-25 ENCOUNTER — Observation Stay (HOSPITAL_BASED_OUTPATIENT_CLINIC_OR_DEPARTMENT_OTHER)
Admission: RE | Admit: 2023-11-25 | Discharge: 2023-11-26 | Disposition: A | Discharge status: Home / Self Care | Source: Home / Self Care | Attending: General Surgery | Admitting: General Surgery

## 2023-11-25 ENCOUNTER — Ambulatory Visit: Payer: Self-pay | Admitting: Urgent Care

## 2023-11-25 ENCOUNTER — Encounter
Admission: RE | Disposition: A | Discharge status: Home / Self Care | Payer: Self-pay | Source: Home / Self Care | Attending: General Surgery

## 2023-11-25 ENCOUNTER — Encounter: Payer: Self-pay | Admitting: General Surgery

## 2023-11-25 DIAGNOSIS — E119 Type 2 diabetes mellitus without complications: Secondary | ICD-10-CM | POA: Insufficient documentation

## 2023-11-25 DIAGNOSIS — Z7984 Long term (current) use of oral hypoglycemic drugs: Secondary | ICD-10-CM | POA: Insufficient documentation

## 2023-11-25 DIAGNOSIS — K420 Umbilical hernia with obstruction, without gangrene: Secondary | ICD-10-CM | POA: Insufficient documentation

## 2023-11-25 DIAGNOSIS — K429 Umbilical hernia without obstruction or gangrene: Principal | ICD-10-CM | POA: Diagnosis present

## 2023-11-25 DIAGNOSIS — E039 Hypothyroidism, unspecified: Secondary | ICD-10-CM | POA: Insufficient documentation

## 2023-11-25 DIAGNOSIS — Z87891 Personal history of nicotine dependence: Secondary | ICD-10-CM | POA: Insufficient documentation

## 2023-11-25 DIAGNOSIS — J449 Chronic obstructive pulmonary disease, unspecified: Secondary | ICD-10-CM | POA: Insufficient documentation

## 2023-11-25 DIAGNOSIS — I10 Essential (primary) hypertension: Secondary | ICD-10-CM | POA: Insufficient documentation

## 2023-11-25 HISTORY — PX: XI ROBOTIC ASSISTED VENTRAL HERNIA: SHX6789

## 2023-11-25 HISTORY — PX: INSERTION OF MESH: SHX5868

## 2023-11-25 MED ORDER — CHLORHEXIDINE GLUCONATE 0.12 % MT SOLN
15.0000 mL | Freq: Once | OROMUCOSAL | Status: AC
  Administered 2023-11-25: 15 mL via OROMUCOSAL

## 2023-11-25 MED ORDER — CLONAZEPAM 0.5 MG PO TABS
0.5000 mg | ORAL_TABLET | Freq: Three times a day (TID) | ORAL | Status: DC
  Administered 2023-11-25 – 2023-11-26 (×3): 0.5 mg via ORAL
  Filled 2023-11-25 (×3): qty 1

## 2023-11-25 MED ORDER — CLONAZEPAM 0.5 MG PO TABS: 0.5000 mg | ORAL_TABLET | Freq: Two times a day (BID) | ORAL | Status: DC

## 2023-11-25 MED ORDER — FLUTICASONE FUROATE-VILANTEROL 200-25 MCG/ACT IN AEPB
1.0000 | INHALATION_SPRAY | Freq: Every day | RESPIRATORY_TRACT | Status: DC
  Administered 2023-11-26: 1 via RESPIRATORY_TRACT
  Filled 2023-11-25: qty 28

## 2023-11-25 MED ORDER — FENTANYL CITRATE (PF) 100 MCG/2ML IJ SOLN
25.0000 ug | INTRAMUSCULAR | Status: DC | PRN
  Administered 2023-11-25 (×3): 25 ug via INTRAVENOUS

## 2023-11-25 MED ORDER — LEVOTHYROXINE SODIUM 112 MCG PO TABS
112.0000 ug | ORAL_TABLET | Freq: Every day | ORAL | Status: DC
  Administered 2023-11-26: 112 ug via ORAL
  Filled 2023-11-25: qty 1

## 2023-11-25 MED ORDER — LISINOPRIL 20 MG PO TABS
20.0000 mg | ORAL_TABLET | Freq: Every day | ORAL | Status: DC
  Administered 2023-11-25 – 2023-11-26 (×2): 20 mg via ORAL
  Filled 2023-11-25 (×2): qty 1

## 2023-11-25 MED ORDER — CEFAZOLIN SODIUM-DEXTROSE 2-4 GM/100ML-% IV SOLN
2.0000 g | Freq: Once | INTRAVENOUS | Status: AC
  Administered 2023-11-25: 2 g via INTRAVENOUS

## 2023-11-25 MED ORDER — FENTANYL CITRATE (PF) 100 MCG/2ML IJ SOLN
INTRAMUSCULAR | Status: DC | PRN
  Administered 2023-11-25 (×2): 50 ug via INTRAVENOUS

## 2023-11-25 MED ORDER — LIDOCAINE HCL (CARDIAC) PF 100 MG/5ML IV SOSY
PREFILLED_SYRINGE | INTRAVENOUS | Status: DC | PRN
  Administered 2023-11-25: 100 mg via INTRAVENOUS

## 2023-11-25 MED ORDER — DILTIAZEM HCL ER 120 MG PO TB24
120.0000 mg | ORAL_TABLET | Freq: Every day | ORAL | Status: DC
  Administered 2023-11-25 – 2023-11-26 (×2): 120 mg via ORAL
  Filled 2023-11-25 (×4): qty 1

## 2023-11-25 MED ORDER — ONDANSETRON HCL 4 MG/2ML IJ SOLN
INTRAMUSCULAR | Status: AC
Start: 2023-11-25 — End: 2023-11-25
  Filled 2023-11-25: qty 2

## 2023-11-25 MED ORDER — PHENYLEPHRINE 80 MCG/ML (10ML) SYRINGE FOR IV PUSH (FOR BLOOD PRESSURE SUPPORT)
PREFILLED_SYRINGE | INTRAVENOUS | Status: DC | PRN
  Administered 2023-11-25 (×3): 80 ug via INTRAVENOUS

## 2023-11-25 MED ORDER — BUPIVACAINE-EPINEPHRINE (PF) 0.5% -1:200000 IJ SOLN
INTRAMUSCULAR | Status: AC
Start: 2023-11-25 — End: 2023-11-25
  Filled 2023-11-25: qty 30

## 2023-11-25 MED ORDER — PROPOFOL 10 MG/ML IV BOLUS
INTRAVENOUS | Status: DC | PRN
  Administered 2023-11-25: 90 mg via INTRAVENOUS

## 2023-11-25 MED ORDER — EPHEDRINE SULFATE-NACL 50-0.9 MG/10ML-% IV SOSY
PREFILLED_SYRINGE | INTRAVENOUS | Status: DC | PRN
  Administered 2023-11-25: 5 mg via INTRAVENOUS

## 2023-11-25 MED ORDER — PROPOFOL 10 MG/ML IV BOLUS
INTRAVENOUS | Status: AC
  Filled 2023-11-25: qty 20

## 2023-11-25 MED ORDER — SUGAMMADEX SODIUM 200 MG/2ML IV SOLN
INTRAVENOUS | Status: DC | PRN
  Administered 2023-11-25: 180 mg via INTRAVENOUS

## 2023-11-25 MED ORDER — DEXAMETHASONE SOD PHOSPHATE PF 10 MG/ML IJ SOLN
INTRAMUSCULAR | Status: DC | PRN
  Administered 2023-11-25: 10 mg via INTRAVENOUS

## 2023-11-25 MED ORDER — MIDAZOLAM HCL (PF) 2 MG/2ML IJ SOLN
INTRAMUSCULAR | Status: DC | PRN
  Administered 2023-11-25: 2 mg via INTRAVENOUS

## 2023-11-25 MED ORDER — EPHEDRINE 5 MG/ML INJ
INTRAVENOUS | Status: AC
Start: 2023-11-25 — End: 2023-11-25
  Filled 2023-11-25: qty 5

## 2023-11-25 MED ORDER — ORAL CARE MOUTH RINSE: 15.0000 mL | Freq: Once | OROMUCOSAL | Status: AC

## 2023-11-25 MED ORDER — ACETAMINOPHEN 10 MG/ML IV SOLN
INTRAVENOUS | Status: AC
  Filled 2023-11-25: qty 100

## 2023-11-25 MED ORDER — FENTANYL CITRATE (PF) 100 MCG/2ML IJ SOLN
INTRAMUSCULAR | Status: AC
  Filled 2023-11-25: qty 2

## 2023-11-25 MED ORDER — ONDANSETRON HCL 4 MG/2ML IJ SOLN
INTRAMUSCULAR | Status: DC | PRN
  Administered 2023-11-25: 4 mg via INTRAVENOUS

## 2023-11-25 MED ORDER — ACETAMINOPHEN 10 MG/ML IV SOLN
INTRAVENOUS | Status: DC | PRN
  Administered 2023-11-25: 1000 mg via INTRAVENOUS

## 2023-11-25 MED ORDER — ROCURONIUM BROMIDE 10 MG/ML (PF) SYRINGE
PREFILLED_SYRINGE | INTRAVENOUS | Status: AC
  Filled 2023-11-25: qty 10

## 2023-11-25 MED ORDER — LACTATED RINGERS IV SOLN
INTRAVENOUS | Status: DC
Start: 2023-11-25 — End: 2023-11-25

## 2023-11-25 MED ORDER — ALBUTEROL SULFATE HFA 108 (90 BASE) MCG/ACT IN AERS: 1.0000 | INHALATION_SPRAY | RESPIRATORY_TRACT | Status: DC | PRN

## 2023-11-25 MED ORDER — CITALOPRAM HYDROBROMIDE 20 MG PO TABS
20.0000 mg | ORAL_TABLET | Freq: Every day | ORAL | Status: DC
  Administered 2023-11-26: 20 mg via ORAL
  Filled 2023-11-25: qty 1

## 2023-11-25 MED ORDER — ORAL CARE MOUTH RINSE: 15.0000 mL | OROMUCOSAL | Status: DC | PRN

## 2023-11-25 MED ORDER — PHENYLEPHRINE 80 MCG/ML (10ML) SYRINGE FOR IV PUSH (FOR BLOOD PRESSURE SUPPORT)
PREFILLED_SYRINGE | INTRAVENOUS | Status: AC
  Filled 2023-11-25: qty 10

## 2023-11-25 MED ORDER — CEFAZOLIN SODIUM-DEXTROSE 2-4 GM/100ML-% IV SOLN
INTRAVENOUS | Status: AC
  Filled 2023-11-25: qty 100

## 2023-11-25 MED ORDER — BUPIVACAINE-EPINEPHRINE (PF) 0.5% -1:200000 IJ SOLN
INTRAMUSCULAR | Status: DC | PRN
  Administered 2023-11-25: 30 mL

## 2023-11-25 MED ORDER — PHENYLEPHRINE HCL-NACL 20-0.9 MG/250ML-% IV SOLN
INTRAVENOUS | Status: DC | PRN
  Administered 2023-11-25: 40 ug/min via INTRAVENOUS

## 2023-11-25 MED ORDER — ALBUTEROL SULFATE (2.5 MG/3ML) 0.083% IN NEBU: 2.5000 mg | INHALATION_SOLUTION | RESPIRATORY_TRACT | Status: DC | PRN

## 2023-11-25 MED ORDER — ROCURONIUM BROMIDE 100 MG/10ML IV SOLN
INTRAVENOUS | Status: DC | PRN
  Administered 2023-11-25: 10 mg via INTRAVENOUS
  Administered 2023-11-25: 60 mg via INTRAVENOUS

## 2023-11-25 MED ORDER — CHLORHEXIDINE GLUCONATE 0.12 % MT SOLN
OROMUCOSAL | Status: AC
  Filled 2023-11-25: qty 15

## 2023-11-25 MED ORDER — MIDAZOLAM HCL 2 MG/2ML IJ SOLN
INTRAMUSCULAR | Status: AC
  Filled 2023-11-25: qty 2

## 2023-11-25 MED ORDER — ACETAMINOPHEN 500 MG PO TABS
1000.0000 mg | ORAL_TABLET | Freq: Three times a day (TID) | ORAL | Status: DC
  Administered 2023-11-25 – 2023-11-26 (×2): 1000 mg via ORAL
  Filled 2023-11-25 (×2): qty 2

## 2023-11-25 MED ORDER — OXYCODONE HCL 5 MG PO TABS
5.0000 mg | ORAL_TABLET | ORAL | Status: DC | PRN
  Administered 2023-11-25 – 2023-11-26 (×4): 5 mg via ORAL
  Filled 2023-11-25 (×4): qty 1

## 2023-11-25 MED ORDER — PHENYLEPHRINE HCL-NACL 20-0.9 MG/250ML-% IV SOLN
INTRAVENOUS | Status: AC
  Filled 2023-11-25: qty 250

## 2023-11-25 MED ORDER — LIDOCAINE HCL (PF) 2 % IJ SOLN
INTRAMUSCULAR | Status: AC
  Filled 2023-11-25: qty 5

## 2023-11-25 SURGICAL SUPPLY — 3 items
DRIVER NDL LRG 8 DVNC XI (INSTRUMENTS) ×2 IMPLANT
NDL HYPO 22X1.5 SAFETY MO (MISCELLANEOUS) ×2 IMPLANT
NDL INSUFFLATION 14GA 120MM (NEEDLE) ×2 IMPLANT

## 2023-11-25 NOTE — Anesthesia Postprocedure Evaluation (Signed)
 Anesthesia Post Note  Patient: LAURIN PAULO  Procedure(s) Performed: REPAIR, HERNIA, VENTRAL, ROBOT-ASSISTED INSERTION OF MESH  Patient location during evaluation: PACU Anesthesia Type: General Level of consciousness: awake and alert Pain management: pain level controlled Vital Signs Assessment: post-procedure vital signs reviewed and stable Respiratory status: spontaneous breathing, nonlabored ventilation, respiratory function stable and patient connected to nasal cannula oxygen  Cardiovascular status: blood pressure returned to baseline and stable Postop Assessment: no apparent nausea or vomiting Anesthetic complications: no   No notable events documented.   Last Vitals:  Vitals:   11/25/23 1145 11/25/23 1159  BP:  125/61  Pulse: 97 93  Resp: 14 14  Temp:  36.4 C  SpO2: 97% 95%    Last Pain:  Vitals:   11/25/23 1159  TempSrc: Oral  PainSc:                  Debby M[place]

## 2023-11-25 NOTE — Anesthesia Preprocedure Evaluation (Signed)
 Anesthesia Evaluation  Patient identified by MRN, date of birth, ID band Patient awake    Reviewed: Allergy & Precautions, H&P , NPO status , Patient's Chart, lab work & pertinent test results, reviewed documented beta blocker date and time   History of Anesthesia Complications (+) PONV and history of anesthetic complications  Airway Mallampati: II  TM Distance: >3 FB Neck ROM: full    Dental no notable dental hx.    Pulmonary COPD, Patient abstained from smoking., former smoker PNA discharged from hospital early December. Pt has home oxygen  but does not use at home.    Pulmonary exam normal        Cardiovascular Exercise Tolerance: Poor hypertension, Pt. on home beta blockers and Pt. on medications + Peripheral Vascular Disease  Normal cardiovascular exam     Neuro/Psych  PSYCHIATRIC DISORDERS Anxiety     negative neurological ROS     GI/Hepatic Neg liver ROS,GERD  ,,  Endo/Other  diabetes, Well Controlled, Type 2Hypothyroidism    Renal/GU      Musculoskeletal   Abdominal   Peds  Hematology negative hematology ROS (+)   Anesthesia Other Findings Past Medical History: No date: Anxiety No date: Arthritis No date: Chronic back pain No date: COPD (chronic obstructive pulmonary disease) (HCC) No date: Depression No date: GERD (gastroesophageal reflux disease) No date: Hypertension No date: Hypothyroidism No date: Neuromuscular disorder (HCC) No date: Pneumonia     Comment:  RECENT X 3 No date: PONV (postoperative nausea and vomiting) No date: Psoriasis No date: Thyroid  disease  Past Surgical History: No date: ABDOMINAL HYSTERECTOMY     Comment:  heavy bleeding No date: bladder stimulator No date: CARPAL TUNNEL RELEASE 03/07/2015: CATARACT EXTRACTION W/PHACO; Right     Comment:  Procedure: CATARACT EXTRACTION PHACO AND INTRAOCULAR               LENS PLACEMENT (IOC);  Surgeon: Steven Dingeldein, MD;                 Location: [facility] ORS;  Service: Ophthalmology;  Laterality:              Right;  US  01:14 AP% 26.4 CDE 33.04 fluid pack lot #               [id number] 03/21/2015: CATARACT EXTRACTION W/PHACO; Left     Comment:  Procedure: CATARACT EXTRACTION PHACO AND INTRAOCULAR               LENS PLACEMENT (IOC);  Surgeon: Steven Dingeldein, MD;                Location: [facility] OR[facility];  Service: Ophthalmology;  Laterality:              Left;  US  01:21 AP% 25.6 CDE 39.78 fluid pack lot #               [id number] No date: HAND SURGERY; Right 09/07/2015: INTERSTIM IMPLANT REMOVAL; N/A     Comment:  Procedure: REMOVAL OF INTERSTIM IMPLANT;  Surgeon: Glendia Elizabeth, MD;  Location: [facility] ORS;  Service: Urology;                Laterality: N/A; No date: OOPHORECTOMY; Right     Comment:  ovarian cyst  BMI    Body Mass Index: 35.89 kg/m      Reproductive/Obstetrics negative OB ROS  Anesthesia Physical Anesthesia Plan  ASA: 3  Anesthesia Plan: General ETT   Post-op Pain Management:    Induction: Intravenous  PONV Risk Score and Plan: 4 or greater and Ondansetron  and Dexamethasone   Airway Management Planned: Oral ETT  Additional Equipment:   Intra-op Plan:   Post-operative Plan: Extubation in OR  Informed Consent: I have reviewed the patients History and Physical, chart, labs and discussed the procedure including the risks, benefits and alternatives for the proposed anesthesia with the patient or authorized representative who has indicated his/her understanding and acceptance.     Dental Advisory Given  Plan Discussed with: Anesthesiologist, CRNA and Surgeon  Anesthesia Plan Comments: (Patient consented for risks of anesthesia including but not limited to:  - adverse reactions to medications - damage to eyes, teeth, lips or other oral mucosa - nerve damage due to positioning  - sore throat or hoarseness - Damage to  heart, brain, nerves, lungs, other parts of body or loss of life  Patient voiced understanding and assent.)        Anesthesia Quick Evaluation

## 2023-11-25 NOTE — H&P (Signed)
 No changes to below H and P, patient has had medical clearance and is appropriately optimized  CC: Umbilical Hernia History of Present Illness [name] is a 73 y.o. female with past medical history significant for COPD who presents in consultation for umbilical hernia.  The patient reports that she went to the ED for an unrelated complaint this summer and had a CT scan that showed an umbilical hernia.  She has noticed a bulge in her umbilicus.  She says she intermittently will get pain from it.  She denies any nausea or vomiting or obstructive symptoms.  She does report that it does not reduce on its own.  She denies any overlying erythema.  She denies previous hernia repair.   Past Medical History     Past Medical History:  Diagnosis Date   Anxiety     Arthritis     Chronic back pain     COPD (chronic obstructive pulmonary disease) (HCC)     Depression     GERD (gastroesophageal reflux disease)     Hypertension     Hypothyroidism     Neuromuscular disorder (HCC)     Pneumonia      RECENT X 3   PONV (postoperative nausea and vomiting)     Psoriasis     Thyroid  disease                   Past Surgical History:  Procedure Laterality Date   ABDOMINAL HYSTERECTOMY        heavy bleeding   bladder stimulator       CARPAL TUNNEL RELEASE       CATARACT EXTRACTION W/PHACO Right 03/07/2015    Procedure: CATARACT EXTRACTION PHACO AND INTRAOCULAR LENS PLACEMENT (IOC);  Surgeon: Steven Dingeldein, MD;  Location: [facility];  Service: Ophthalmology;  Laterality: Right;  US  01:14 AP% 26.4 CDE 33.04 fluid pack lot # [id number]   CATARACT EXTRACTION W/PHACO Left 03/21/2015    Procedure: CATARACT EXTRACTION PHACO AND INTRAOCULAR LENS PLACEMENT (IOC);  Surgeon: Steven Dingeldein, MD;  Location: [facility];  Service: Ophthalmology;  Laterality: Left;  US  01:21 AP% 25.6 CDE 39.78 fluid pack lot # [id number]   HAND SURGERY Right     INTERSTIM IMPLANT REMOVAL N/A 09/07/2015    Procedure: REMOVAL OF  RENNA IMPLANT;  Surgeon: Glendia Elizabeth, MD;  Location: [facility];  Service: Urology;  Laterality: N/A;   LUMBAR LAMINECTOMY/DECOMPRESSION MICRODISCECTOMY N/A 02/28/2023    Procedure: L3-4 LAMINECTOMY, L4-5 LAMINECTOMY AND DISCECTOMY OF LEFT SIDE, L5-S1 LAMINECTOMY;  Surgeon: Claudene Penne ORN, MD;  Location: [facility];  Service: Neurosurgery;  Laterality: N/A;   OOPHORECTOMY Right      ovarian cyst          Allergies       Allergies  Allergen Reactions   Ciprofloxacin Hives and Diarrhea   Gabapentin  Other (See Comments)      Hallucination   Trazodone  And Nefazodone Other (See Comments)      Altered Mental Status     Trazodone  Other (See Comments)   Mucinex  [Guaifenesin  Er] Nausea Only   Oxycodone  Diarrhea   Sulfa Antibiotics Rash      Throat, mouth Throat, mouth              Current Outpatient Medications  Medication Sig Dispense Refill   acetaminophen  (TYLENOL ) 500 MG tablet Take 500 mg by mouth every 6 (six) hours as needed for moderate pain.        albuterol  (VENTOLIN  HFA) 108 (  90 Base) MCG/ACT inhaler INHALE 2 PUFFS BY MOUTH EVERY 6 HOURS AS NEEDED FOR WHEEZING OR SHORTNESS OF BREATH 8.5 g 2   citalopram  (CELEXA ) 20 MG tablet TAKE 1 TABLET(20 MG) BY MOUTH DAILY 90 tablet 0   clonazePAM  (KLONOPIN ) 0.5 MG tablet TAKE 1 TABLET BY MOUTH EVERY MORNING AND EVERY AFTERNOON AND EVERY EVENING 90 tablet 0   diltiazem  (CARDIZEM  LA) 120 MG 24 hr tablet Take 1 tablet (120 mg total) by mouth daily.       fenofibrate  (TRICOR ) 145 MG tablet TAKE 1 TABLET(145 MG) BY MOUTH DAILY 90 tablet 0   fluticasone  furoate-vilanterol (BREO ELLIPTA ) 200-25 MCG/ACT AEPB Inhale 1 puff into the lungs daily. 1 each 11   levothyroxine  (SYNTHROID ) 112 MCG tablet TAKE 1 TABLET(112 MCG) BY MOUTH DAILY 90 tablet 0   lisinopril  (ZESTRIL ) 20 MG tablet Take 1 tablet (20 mg total) by mouth daily. 90 tablet 1   metFORMIN  (GLUCOPHAGE -XR) 500 MG 24 hr tablet TAKE 1 TABLET(500 MG) BY MOUTH DAILY WITH BREAKFAST 90  tablet 0   Misc. Devices (QUAD CANE/SMALL BASE) MISC 1 Device by Does not apply route daily. 1 each 0   OXYGEN  Inhale 2 L/min into the lungs as needed.       triamcinolone  ointment (KENALOG ) 0.1 % Apply 1 Application topically as needed.          No current facility-administered medications for this visit.        Family History      Family History  Problem Relation Age of Onset   Heart disease Mother     Breast cancer Mother     Heart attack Mother     COPD Father     Stroke Paternal Uncle     Kidney disease Sister     Breast cancer Cousin     Breast cancer Cousin     Heart attack Maternal Aunt     Stroke Paternal Grandfather     Kidney cancer Neg Hx     Bladder Cancer Neg Hx              Social History Social History  Social History         Tobacco Use   Smoking status: Former      Current packs/day: 0.00      Average packs/day: 1 pack/day for 35.0 years (35.0 ttl pk-yrs)      Types: Cigarettes      Start date: 04/19/2018      Quit date: 11/16/2020      Years since quitting: 2.9   Smokeless tobacco: Never   Tobacco comments:      Previously smoked 2 ppd.   Vaping Use   Vaping status: Never Used  Substance Use Topics   Alcohol  use: No   Drug use: No            ROS Full ROS of systems performed and is otherwise negative there than what is stated in the HPI   Physical Exam Blood pressure (!) 147/65, pulse 95, temperature 98.1 F (36.7 C), temperature source Oral, height 5' 3.5 (1.613 m), weight 189 lb 9.6 oz (86 kg), SpO2 97%.   Alert and oriented x 3, normal work of breathing room air, regular rate and rhythm, abdomen is soft, nondistended, there is a umbilical hernia with some purple discoloration right over the hernia.  I am unable to completely reduce the hernia but there does not feel like there is any bowel in it and it is  soft.   Data Reviewed They do feel we reviewed her CT scan from December.  There is a fat-containing hernia   I have  personally reviewed the patient's imaging and medical records.     Assessment Assessment 73 year old female with umbilical hernia   Plan Plan Given that it is chronically incarcerated with fat in it and she is having some intermittent discomfort I did recommend repair.  I discussed the risk, benefits alternatives of repair including risk of infection, bleeding, damage to underlying viscera as well as recurrence.  Given her body habitus and the size of the hernia I think it would be best repair this robotically with mesh.  Due to the fact that she has COPD and diabetes I do think we should have her undergo medical clearance prior to surgery.       Jayson MALVA Endow

## 2023-11-25 NOTE — Anesthesia Procedure Notes (Signed)
 Procedure Name: Intubation Date/Time: 11/25/2023 7:39 AM  Performed by: Jackye Spanner, CRNAPre-anesthesia Checklist: Patient identified, Patient being monitored, Timeout performed, Emergency Drugs available and Suction available Patient Re-evaluated:Patient Re-evaluated prior to induction Oxygen  Delivery Method: Circle system utilized Preoxygenation: Pre-oxygenation with 100% oxygen  Induction Type: IV induction Ventilation: Mask ventilation without difficulty and Oral airway inserted - appropriate to patient size Laryngoscope Size: 3 and McGrath Grade View: Grade I Tube type: Oral Tube size: 6.5 mm Number of attempts: 1 Airway Equipment and Method: Stylet Placement Confirmation: ETT inserted through vocal cords under direct vision, positive ETCO2 and breath sounds checked- equal and bilateral Secured at: 19 cm Tube secured with: Tape Dental Injury: Teeth and Oropharynx as per pre-operative assessment  Comments: Smooth atraumatic intubation, no complications noted.

## 2023-11-25 NOTE — Op Note (Addendum)
 Operative note  Preoperative diagnosis: Umbilical hernia, with incarcerated fat Postoperative diagnosis: Incarcerated umbilical hernia measuring approximately 3.2 cm Surgeon: Jayson Endow MD EBL: 10 cc Procedure: Robotic assisted umbilical hernia repair with mesh, hernia measuring 3.2 cm containing incarcerated fat  After informed consent was obtained the patient was brought to the operating room and laid supine on the operating room table.  General endotracheal anesthesia was then induced and her abdomen was then prepped and draped in the usual sterile fashion.  Surgical timeout was called and the correct patient, site, side and procedure.  An 8 mm incision was made in the left upper quadrant and the Veress needle inserted into the peritoneum using standard drop technique.  Pneumoperitoneum was then established.  Using an Optiview trocar and a millimeter robotic port was placed into this incision under direct visualization.  There is noted to be no injury at the site of Veress needle insertion.  Upon survey of the abdomen there was a umbilical hernia containing fat.  2 additional 8 mm robotic trocars were placed under direct visualization in the left mid clavicular line.  The fat was grasped and with gentle traction and Bovie cautery was able to be excised from the hernia sac and reduced into the abdomen.  I then turned my attention to creating a preperitoneal flap for mesh placement.  The peritoneum was then incised and a preperitoneal plane was carried towards the hernia.  The hernia sac was then fully reduced and the preperitoneal plane was taken to the right hemiabdomen.  The defect measured approximately 3.2 cm.  The defect was then closed with a 0 STRATAFIX suture.  A ProGrip mesh was then selected.  It measured approximately 10 cm x 10 cm to adequately have approximately 3 to 4 cm of overlap in all directions.  It was placed into the abdomen and adhered to the anterior abdominal wall.  The mesh laid  nicely with good apposition to the anterior abdominal wall.  The peritoneum was then closed with a running 2 oh STRATAFIX suture.  There were a few holes in the peritoneum that were closed with 2-0 Vicryl.  Hemostasis was ensured.  The abdomen was then allowed to be desufflated under direct visualization.  The skin incisions were then closed with 4-0 Monocryl and dressed with glue.  A pressure dressing was placed over the umbilicus.  Prior to termination of the procedure all sponge and instrument counts were correct x 2.  The patient was then awoken from general endotracheal anesthesia and taken to the PACU in good condition.

## 2023-11-25 NOTE — Plan of Care (Signed)
  Problem: Clinical Measurements: Goal: Ability to maintain clinical measurements within normal limits will improve Outcome: Progressing Goal: Will remain free from infection Outcome: Progressing Goal: Diagnostic test results will improve Outcome: Progressing Goal: Respiratory complications will improve Outcome: Progressing Goal: Cardiovascular complication will be avoided Outcome: Progressing   Problem: Nutrition: Goal: Adequate nutrition will be maintained Outcome: Progressing   Problem: Activity: Goal: Risk for activity intolerance will decrease Outcome: Progressing   

## 2023-11-25 NOTE — Transfer of Care (Signed)
 Immediate Anesthesia Transfer of Care Note  Patient: [name]  Procedure(s) Performed: REPAIR, HERNIA, VENTRAL, ROBOT-ASSISTED INSERTION OF MESH  Patient Location: PACU  Anesthesia Type:General  Level of Consciousness: awake, alert , and drowsy  Airway & Oxygen  Therapy: Patient Spontanous Breathing and Patient connected to face mask oxygen   Post-op Assessment: Report given to RN and Post -op Vital signs reviewed and stable  Post vital signs: Reviewed and stable  Last Vitals:  Vitals Value Taken Time  BP 149/60 11/25/23 10:05  Temp 97.7 1006  Pulse 94 11/25/23 10:10  Resp 20 11/25/23 10:10  SpO2 98 % 11/25/23 10:10  Vitals shown include unfiled device data.  Last Pain:  Vitals:   11/25/23 0645  TempSrc: Tympanic  PainSc: 8          Complications: No notable events documented.

## 2023-11-26 ENCOUNTER — Encounter: Payer: Self-pay | Admitting: General Surgery

## 2023-11-26 DIAGNOSIS — K420 Umbilical hernia with obstruction, without gangrene: Secondary | ICD-10-CM | POA: Diagnosis not present

## 2023-11-26 MED ORDER — OXYCODONE HCL 5 MG PO TABS: 5.0000 mg | ORAL_TABLET | Freq: Four times a day (QID) | ORAL | 0 refills | Status: AC | PRN

## 2023-11-26 NOTE — Discharge Summary (Signed)
 [facility] SURGICAL DISCHARGE SUMMARY  Patient ID: [name] MRN: [id number] DOB/AGE: 74-Jul-1952 74 y.o.  Admit date: 11/25/2023 Discharge date: 11/26/2023  Discharge Diagnoses Patient Active Problem List   Diagnosis Date Noted   Umbilical hernia 11/25/2023    Consultants None  Procedures 11/25/2023:  Robotic assisted laparoscopic umbilical hernia repair   HPI: [name] is a 73 y.o. female with past medical history significant for COPD who presents in consultation for umbilical hernia. The patient reports that she went to the ED for an unrelated complaint this summer and had a CT scan that showed an umbilical hernia. She has noticed a bulge in her umbilicus. She says she intermittently will get pain from it. She denies any nausea or vomiting or obstructive symptoms. She does report that it does not reduce on its own. She denies any overlying erythema. She denies previous hernia repair.   Hospital Course: Informed consent was obtained and documented, and patient underwent uneventful robotic assisted laparoscopic umbilical hernia repair (Dr Marinda, 11/25/2023).  Post-operatively, patient was admitted overnight for observation. Advancement of patient's diet and ambulation were well-tolerated. The remainder of patient's hospital course was essentially unremarkable, and discharge planning was initiated accordingly with patient safely able to be discharged home with appropriate discharge instructions, pain control, and outpatient follow-up after all of her questions were answered to her expressed satisfaction.   Discharge Condition: Good   Physical Examination:  Constitutional: Well appearing female; NAD Pulmonary: Normal effort, no respiratory distress Gastrointestinal: Soft, incisional tenderness, non-distended, no rebound/guarding Skin: Laparoscopic incisions are CDI with dermabond, no erythema or drainage    Allergies as of 11/26/2023       Reactions    Ciprofloxacin Hives, Diarrhea   Gabapentin  Other (See Comments)   Hallucination   Trazodone  And Nefazodone Other (See Comments)   Altered Mental Status   Trazodone  Other (See Comments)   Mucinex  [guaifenesin  Er] Nausea Only   Oxycodone  Diarrhea   Sulfa Antibiotics Rash   Throat, mouth Throat, mouth        Medication List     TAKE these medications    acetaminophen  500 MG tablet Commonly known as: TYLENOL  Take 500 mg by mouth every 6 (six) hours as needed for moderate pain.   albuterol  108 (90 Base) MCG/ACT inhaler Commonly known as: VENTOLIN  HFA INHALE 2 PUFFS BY MOUTH EVERY 6 HOURS AS NEEDED FOR WHEEZING OR SHORTNESS OF BREATH   bimekizumab-bkzx 320 MG/2ML pen Commonly known as: BIMZELX Inject 320 mg into the skin every 30 (thirty) days.   citalopram  20 MG tablet Commonly known as: CELEXA  TAKE 1 TABLET(20 MG) BY MOUTH DAILY   clonazePAM  0.5 MG tablet Commonly known as: KLONOPIN  TAKE 1 TABLET BY MOUTH EVERY MORNING AND EVERY AFTERNOON AND EVERY EVENING   diltiazem  120 MG 24 hr tablet Commonly known as: CARDIZEM  LA Take 1 tablet (120 mg total) by mouth daily.   fenofibrate  145 MG tablet Commonly known as: TRICOR  TAKE 1 TABLET(145 MG) BY MOUTH DAILY   fluticasone  furoate-vilanterol 200-25 MCG/ACT Aepb Commonly known as: BREO ELLIPTA  Inhale 1 puff into the lungs daily.   levothyroxine  112 MCG tablet Commonly known as: SYNTHROID  TAKE 1 TABLET(112 MCG) BY MOUTH DAILY   lisinopril  20 MG tablet Commonly known as: ZESTRIL  Take 1 tablet (20 mg total) by mouth daily.   metFORMIN  500 MG 24 hr tablet Commonly known as: GLUCOPHAGE -XR TAKE 1 TABLET(500 MG) BY MOUTH DAILY WITH BREAKFAST   oxyCODONE  5 MG immediate release tablet Commonly known  as: Oxy IR/ROXICODONE  Take 1 tablet (5 mg total) by mouth every 6 (six) hours as needed for severe pain (pain score 7-10) or breakthrough pain.   OXYGEN  Inhale 2 L/min into the lungs as needed.   Quad Cane/Small Base  Misc 1 Device by Does not apply route daily.   triamcinolone  ointment 0.1 % Commonly known as: KENALOG  Apply 1 Application topically as needed.          Follow-up Information     Marinda Jayson KIDD, MD. Go on 12/12/2023.   Specialty: General Surgery Why: Go to appointment on 11/06 at 130 PM Contact information: [address] [place] [address] [phone]                  Time spent on discharge management including discussion of hospital course, clinical condition, outpatient instructions, prescriptions, and follow up with the patient and members of the medical team: >30 minutes  -- Arthea Platt , PA-C [facility]  11/26/2023, 8:33 AM [phone] M-F: 7am - 4pm

## 2023-11-26 NOTE — Care Management Obs Status (Signed)
 MEDICARE OBSERVATION STATUS NOTIFICATION   Patient Details  Name: [name] MRN: [id number] Date of Birth: 1950/03/05   Medicare Observation Status Notification Given:  Yes    Rojelio SHAUNNA Rattler 11/26/2023, 12:28 PM

## 2023-11-26 NOTE — TOC Transition Note (Signed)
 Transition of Care [facility]) - Discharge Note   Patient Details  Name: [name] MRN: [id number] Date of Birth: [birth year]  [facility] CM/SW Contact:  Alfonso Rummer, LCSW Phone Number: 11/26/2023, 10:25 AM   Clinical Narrative:     LCSW A. Valrie Jia spk with pt via room phone [phone]. Pt reports her son will pick her up at 3pm. No TOC needs identified. TOC signing off.  Final next level of care: Home/Self Care Barriers to Discharge: Barriers Resolved   Patient Goals and CMS Choice            Discharge Placement               Home         Discharge Plan and Services Additional resources added to the After Visit Summary for                                       Social Drivers of Health (SDOH) Interventions SDOH Screenings   Food Insecurity: No Food Insecurity (11/25/2023)  Housing: Unknown (11/25/2023)  Transportation Needs: No Transportation Needs (11/25/2023)  Utilities: Not At Risk (11/25/2023)  Alcohol  Screen: Low Risk  (05/03/2023)  Depression (PHQ2-9): Low Risk  (11/15/2023)  Financial Resource Strain: Low Risk  (07/09/2023)  Physical Activity: Insufficiently Active (05/03/2023)  Social Connections: Patient Declined (11/25/2023)  Stress: No Stress Concern Present (07/09/2023)  Tobacco Use: Medium Risk (11/25/2023)  Health Literacy: Adequate Health Literacy (07/09/2023)     Readmission Risk Interventions    01/05/2023   12:33 PM  Readmission Risk Prevention Plan  Transportation Screening Complete  PCP or Specialist Appt within 3-5 Days Complete  HRI or Home Care Consult Complete  Social Work Consult for Recovery Care Planning/Counseling Complete  Palliative Care Screening Not Applicable  Medication Review Ocean[employer]) Complete

## 2023-11-26 NOTE — Discharge Instructions (Signed)
 In addition to included general post-operative instructions,  Diet: Resume home diet.   Activity: No heavy lifting >20 pounds (children, pets, laundry, garbage) or strenuous activity for at least 6 weeks, but light activity and walking are encouraged. Do not drive or drink alcohol  if taking narcotic pain medications or having pain that might distract from driving.  Wound care: You may shower/get incision wet with soapy water and pat dry (do not rub incisions), but no baths or submerging incision underwater until follow-up.   Medications: Resume all home medications. For mild to moderate pain: acetaminophen  (Tylenol ) or ibuprofen /naproxen (if no kidney disease). Combining Tylenol  with alcohol  can substantially increase your risk of causing liver disease. Narcotic pain medications, if prescribed, can be used for severe pain, though may cause nausea, constipation, and drowsiness. Do not combine Tylenol  and Percocet (or similar) within a 6 hour period as Percocet (and similar) contain(s) Tylenol . If you do not need the narcotic pain medication, you do not need to fill the prescription.  Call office [phone] / [phone]) at any time if any questions, worsening pain, fevers/chills, bleeding, drainage from incision site, or other concerns.

## 2023-11-26 NOTE — Plan of Care (Signed)

## 2023-11-27 ENCOUNTER — Emergency Department

## 2023-11-27 ENCOUNTER — Inpatient Hospital Stay (HOSPITAL_COMMUNITY)
Admit: 2023-11-27 | Discharge: 2023-11-27 | Disposition: A | Discharge status: Home / Self Care | Attending: Pulmonary Disease | Admitting: Pulmonary Disease

## 2023-11-27 ENCOUNTER — Inpatient Hospital Stay

## 2023-11-27 ENCOUNTER — Other Ambulatory Visit: Payer: Self-pay

## 2023-11-27 ENCOUNTER — Inpatient Hospital Stay
Admission: EM | Admit: 2023-11-27 | Discharge: 2023-12-07 | DRG: 853 | Disposition: E | Attending: Internal Medicine | Admitting: Internal Medicine

## 2023-11-27 DIAGNOSIS — I639 Cerebral infarction, unspecified: Secondary | ICD-10-CM

## 2023-11-27 DIAGNOSIS — R4182 Altered mental status, unspecified: Principal | ICD-10-CM

## 2023-11-27 DIAGNOSIS — R578 Other shock: Secondary | ICD-10-CM

## 2023-11-27 DIAGNOSIS — R4189 Other symptoms and signs involving cognitive functions and awareness: Secondary | ICD-10-CM | POA: Diagnosis present

## 2023-11-27 DIAGNOSIS — I480 Paroxysmal atrial fibrillation: Secondary | ICD-10-CM

## 2023-11-27 DIAGNOSIS — A419 Sepsis, unspecified organism: Secondary | ICD-10-CM

## 2023-11-27 DIAGNOSIS — T17908A Unspecified foreign body in respiratory tract, part unspecified causing other injury, initial encounter: Secondary | ICD-10-CM

## 2023-11-27 DIAGNOSIS — J9601 Acute respiratory failure with hypoxia: Secondary | ICD-10-CM

## 2023-11-27 DIAGNOSIS — J9602 Acute respiratory failure with hypercapnia: Secondary | ICD-10-CM

## 2023-11-27 DIAGNOSIS — R579 Shock, unspecified: Secondary | ICD-10-CM

## 2023-11-27 DIAGNOSIS — Z1152 Encounter for screening for COVID-19: Secondary | ICD-10-CM

## 2023-11-27 DIAGNOSIS — J9621 Acute and chronic respiratory failure with hypoxia: Secondary | ICD-10-CM | POA: Diagnosis present

## 2023-11-27 DIAGNOSIS — Z7984 Long term (current) use of oral hypoglycemic drugs: Secondary | ICD-10-CM

## 2023-11-27 DIAGNOSIS — J9622 Acute and chronic respiratory failure with hypercapnia: Secondary | ICD-10-CM | POA: Diagnosis present

## 2023-11-27 DIAGNOSIS — R570 Cardiogenic shock: Secondary | ICD-10-CM | POA: Diagnosis present

## 2023-11-27 DIAGNOSIS — J690 Pneumonitis due to inhalation of food and vomit: Secondary | ICD-10-CM | POA: Diagnosis present

## 2023-11-27 DIAGNOSIS — D72829 Elevated white blood cell count, unspecified: Secondary | ICD-10-CM | POA: Diagnosis present

## 2023-11-27 DIAGNOSIS — R6521 Severe sepsis with septic shock: Secondary | ICD-10-CM | POA: Diagnosis present

## 2023-11-27 DIAGNOSIS — F419 Anxiety disorder, unspecified: Secondary | ICD-10-CM | POA: Diagnosis present

## 2023-11-27 DIAGNOSIS — E1165 Type 2 diabetes mellitus with hyperglycemia: Secondary | ICD-10-CM | POA: Diagnosis present

## 2023-11-27 DIAGNOSIS — Z881 Allergy status to other antibiotic agents status: Secondary | ICD-10-CM

## 2023-11-27 DIAGNOSIS — Z789 Other specified health status: Secondary | ICD-10-CM | POA: Diagnosis not present

## 2023-11-27 DIAGNOSIS — Z882 Allergy status to sulfonamides status: Secondary | ICD-10-CM

## 2023-11-27 DIAGNOSIS — G9341 Metabolic encephalopathy: Secondary | ICD-10-CM | POA: Diagnosis present

## 2023-11-27 DIAGNOSIS — Z515 Encounter for palliative care: Secondary | ICD-10-CM | POA: Diagnosis not present

## 2023-11-27 DIAGNOSIS — Z888 Allergy status to other drugs, medicaments and biological substances status: Secondary | ICD-10-CM

## 2023-11-27 DIAGNOSIS — I63412 Cerebral infarction due to embolism of left middle cerebral artery: Secondary | ICD-10-CM | POA: Diagnosis present

## 2023-11-27 DIAGNOSIS — N179 Acute kidney failure, unspecified: Secondary | ICD-10-CM | POA: Diagnosis not present

## 2023-11-27 DIAGNOSIS — I63512 Cerebral infarction due to unspecified occlusion or stenosis of left middle cerebral artery: Secondary | ICD-10-CM

## 2023-11-27 DIAGNOSIS — Z79890 Hormone replacement therapy (postmenopausal): Secondary | ICD-10-CM

## 2023-11-27 DIAGNOSIS — E8721 Acute metabolic acidosis: Secondary | ICD-10-CM | POA: Diagnosis not present

## 2023-11-27 DIAGNOSIS — F32A Depression, unspecified: Secondary | ICD-10-CM | POA: Diagnosis present

## 2023-11-27 DIAGNOSIS — J449 Chronic obstructive pulmonary disease, unspecified: Secondary | ICD-10-CM | POA: Diagnosis not present

## 2023-11-27 DIAGNOSIS — J189 Pneumonia, unspecified organism: Secondary | ICD-10-CM | POA: Diagnosis present

## 2023-11-27 DIAGNOSIS — Z79899 Other long term (current) drug therapy: Secondary | ICD-10-CM

## 2023-11-27 DIAGNOSIS — K420 Umbilical hernia with obstruction, without gangrene: Secondary | ICD-10-CM | POA: Diagnosis present

## 2023-11-27 DIAGNOSIS — Z87891 Personal history of nicotine dependence: Secondary | ICD-10-CM

## 2023-11-27 DIAGNOSIS — E872 Acidosis, unspecified: Secondary | ICD-10-CM | POA: Diagnosis present

## 2023-11-27 DIAGNOSIS — I4891 Unspecified atrial fibrillation: Secondary | ICD-10-CM | POA: Diagnosis not present

## 2023-11-27 DIAGNOSIS — N170 Acute kidney failure with tubular necrosis: Secondary | ICD-10-CM | POA: Diagnosis present

## 2023-11-27 DIAGNOSIS — Z66 Do not resuscitate: Secondary | ICD-10-CM | POA: Diagnosis present

## 2023-11-27 DIAGNOSIS — Z6836 Body mass index (BMI) 36.0-36.9, adult: Secondary | ICD-10-CM

## 2023-11-27 DIAGNOSIS — I10 Essential (primary) hypertension: Secondary | ICD-10-CM | POA: Diagnosis present

## 2023-11-27 DIAGNOSIS — Z7189 Other specified counseling: Secondary | ICD-10-CM | POA: Diagnosis not present

## 2023-11-27 DIAGNOSIS — I63413 Cerebral infarction due to embolism of bilateral middle cerebral arteries: Secondary | ICD-10-CM | POA: Diagnosis not present

## 2023-11-27 DIAGNOSIS — I63511 Cerebral infarction due to unspecified occlusion or stenosis of right middle cerebral artery: Secondary | ICD-10-CM | POA: Diagnosis not present

## 2023-11-27 DIAGNOSIS — R7989 Other specified abnormal findings of blood chemistry: Secondary | ICD-10-CM | POA: Diagnosis not present

## 2023-11-27 DIAGNOSIS — I2489 Other forms of acute ischemic heart disease: Secondary | ICD-10-CM | POA: Diagnosis present

## 2023-11-27 DIAGNOSIS — J440 Chronic obstructive pulmonary disease with acute lower respiratory infection: Secondary | ICD-10-CM | POA: Diagnosis present

## 2023-11-27 DIAGNOSIS — E039 Hypothyroidism, unspecified: Secondary | ICD-10-CM | POA: Diagnosis present

## 2023-11-27 DIAGNOSIS — R6520 Severe sepsis without septic shock: Secondary | ICD-10-CM | POA: Diagnosis not present

## 2023-11-27 HISTORY — DX: Chronic obstructive pulmonary disease, unspecified: J44.9

## 2023-11-27 LAB — BLOOD GAS, ARTERIAL
Acid-base deficit: 0.3 mmol/L (ref 0.0–2.0)
Acid-base deficit: 1.4 mmol/L (ref 0.0–2.0)
Bicarbonate: 28.5 mmol/L — ABNORMAL HIGH (ref 20.0–28.0)
Bicarbonate: 26.9 mmol/L (ref 20.0–28.0)
FIO2: 60 %
FIO2: 50 %
MECHVT: 400 mL
MECHVT: 500 mL
Mechanical Rate: 15
Mechanical Rate: 18
O2 Saturation: 97.9 %
O2 Saturation: 97.5 %
PEEP: 8 cmH2O
PEEP: 8 cmH2O
Patient temperature: 37
Patient temperature: 37
pCO2 arterial: 65 mmHg — ABNORMAL HIGH (ref 32–48)
pCO2 arterial: 60 mmHg — ABNORMAL HIGH (ref 32–48)
pH, Arterial: 7.25 — ABNORMAL LOW (ref 7.35–7.45)
pH, Arterial: 7.26 — ABNORMAL LOW (ref 7.35–7.45)
pO2, Arterial: 82 mmHg — ABNORMAL LOW (ref 83–108)
pO2, Arterial: 86 mmHg (ref 83–108)

## 2023-11-27 LAB — RESPIRATORY PANEL BY PCR

## 2023-11-27 LAB — URINE DRUG SCREEN, QUALITATIVE (ARMC ONLY)
Amphetamines, Ur Screen: NOT DETECTED
Barbiturates, Ur Screen: NOT DETECTED
Benzodiazepine, Ur Scrn: POSITIVE — AB
Cannabinoid 50 Ng, Ur ~~LOC~~: NOT DETECTED
Cocaine Metabolite,Ur ~~LOC~~: NOT DETECTED
MDMA (Ecstasy)Ur Screen: NOT DETECTED
Methadone Scn, Ur: NOT DETECTED
Opiate, Ur Screen: NOT DETECTED
Phencyclidine (PCP) Ur S: NOT DETECTED
Tricyclic, Ur Screen: NOT DETECTED

## 2023-11-27 LAB — CBC WITH DIFFERENTIAL/PLATELET
Abs Immature Granulocytes: 0.22 K/uL — ABNORMAL HIGH (ref 0.00–0.07)
Basophils Absolute: 0 K/uL (ref 0.0–0.1)
Basophils Relative: 0 %
Eosinophils Absolute: 0 K/uL (ref 0.0–0.5)
Eosinophils Relative: 0 %
HCT: 42.8 % (ref 36.0–46.0)
Hemoglobin: 12.3 g/dL (ref 12.0–15.0)
Immature Granulocytes: 1 %
Lymphocytes Relative: 13 %
Lymphs Abs: 2.2 K/uL (ref 0.7–4.0)
MCH: 28.7 pg (ref 26.0–34.0)
MCHC: 28.7 g/dL — ABNORMAL LOW (ref 30.0–36.0)
MCV: 100 fL (ref 80.0–100.0)
Monocytes Absolute: 2.4 K/uL — ABNORMAL HIGH (ref 0.1–1.0)
Monocytes Relative: 14 %
Neutro Abs: 12.7 K/uL — ABNORMAL HIGH (ref 1.7–7.7)
Neutrophils Relative %: 72 %
Platelets: 333 K/uL (ref 150–400)
RBC: 4.28 MIL/uL (ref 3.87–5.11)
RDW: 14.4 % (ref 11.5–15.5)
WBC: 17.6 K/uL — ABNORMAL HIGH (ref 4.0–10.5)
nRBC: 0 % (ref 0.0–0.2)

## 2023-11-27 LAB — BLOOD GAS, VENOUS
Acid-base deficit: 6.4 mmol/L — ABNORMAL HIGH (ref 0.0–2.0)
Bicarbonate: 25.1 mmol/L (ref 20.0–28.0)
FIO2: 60 %
Mechanical Rate: 15
O2 Saturation: 94.8 %
PEEP: 8 cmH2O
Patient temperature: 37
Spontaneous VT: 400 mL
pCO2, Ven: 81 mmHg (ref 44–60)
pH, Ven: 7.1 — CL (ref 7.25–7.43)
pO2, Ven: 74 mmHg — ABNORMAL HIGH (ref 32–45)

## 2023-11-27 LAB — URINALYSIS, ROUTINE W REFLEX MICROSCOPIC
Bilirubin Urine: NEGATIVE
Glucose, UA: NEGATIVE mg/dL
Hgb urine dipstick: NEGATIVE
Ketones, ur: NEGATIVE mg/dL
Leukocytes,Ua: NEGATIVE
Nitrite: NEGATIVE
Protein, ur: NEGATIVE mg/dL
Specific Gravity, Urine: 1.012 (ref 1.005–1.030)
pH: 5 (ref 5.0–8.0)

## 2023-11-27 LAB — TROPONIN I (HIGH SENSITIVITY)
Troponin I (High Sensitivity): 48 ng/L — ABNORMAL HIGH (ref <18)
Troponin I (High Sensitivity): 82 ng/L — ABNORMAL HIGH (ref <18)
Troponin I (High Sensitivity): 332 ng/L (ref <18)
Troponin I (High Sensitivity): 379 ng/L (ref <18)

## 2023-11-27 LAB — COMPREHENSIVE METABOLIC PANEL WITH GFR
ALT: 58 U/L — ABNORMAL HIGH (ref 0–44)
AST: 127 U/L — ABNORMAL HIGH (ref 15–41)
Albumin: 3.2 g/dL — ABNORMAL LOW (ref 3.5–5.0)
Alkaline Phosphatase: 71 U/L (ref 38–126)
Anion gap: 23 — ABNORMAL HIGH (ref 5–15)
BUN: 29 mg/dL — ABNORMAL HIGH (ref 8–23)
CO2: 19 mmol/L — ABNORMAL LOW (ref 22–32)
Calcium: 8.5 mg/dL — ABNORMAL LOW (ref 8.9–10.3)
Chloride: 100 mmol/L (ref 98–111)
Creatinine, Ser: 1.7 mg/dL — ABNORMAL HIGH (ref 0.44–1.00)
GFR, Estimated: 31 mL/min — ABNORMAL LOW (ref >60)
Glucose, Bld: 256 mg/dL — ABNORMAL HIGH (ref 70–99)
Potassium: 3.8 mmol/L (ref 3.5–5.1)
Sodium: 142 mmol/L (ref 135–145)
Total Bilirubin: 0.8 mg/dL (ref 0.0–1.2)
Total Protein: 6.8 g/dL (ref 6.5–8.1)

## 2023-11-27 LAB — MRSA NEXT GEN BY PCR, NASAL: MRSA by PCR Next Gen: NOT DETECTED

## 2023-11-27 LAB — CBG MONITORING, ED: Glucose-Capillary: 275 mg/dL — ABNORMAL HIGH (ref 70–99)

## 2023-11-27 LAB — RESP PANEL BY RT-PCR (RSV, FLU A&B, COVID)  RVPGX2
Influenza A by PCR: NEGATIVE
Influenza B by PCR: NEGATIVE
Resp Syncytial Virus by PCR: NEGATIVE
SARS Coronavirus 2 by RT PCR: NEGATIVE

## 2023-11-27 LAB — T4, FREE: Free T4: 1.2 ng/dL — ABNORMAL HIGH (ref 0.61–1.12)

## 2023-11-27 LAB — STREP PNEUMONIAE URINARY ANTIGEN: Strep Pneumo Urinary Antigen: NEGATIVE

## 2023-11-27 LAB — TSH: TSH: 1.852 u[IU]/mL (ref 0.350–4.500)

## 2023-11-27 LAB — GLUCOSE, CAPILLARY
Glucose-Capillary: 204 mg/dL — ABNORMAL HIGH (ref 70–99)
Glucose-Capillary: 219 mg/dL — ABNORMAL HIGH (ref 70–99)
Glucose-Capillary: 241 mg/dL — ABNORMAL HIGH (ref 70–99)

## 2023-11-27 LAB — PROCALCITONIN: Procalcitonin: 0.74 ng/mL

## 2023-11-27 LAB — LACTIC ACID, PLASMA
Lactic Acid, Venous: 6.8 mmol/L (ref 0.5–1.9)
Lactic Acid, Venous: 6 mmol/L (ref 0.5–1.9)
Lactic Acid, Venous: 3.1 mmol/L (ref 0.5–1.9)
Lactic Acid, Venous: 3 mmol/L (ref 0.5–1.9)

## 2023-11-27 LAB — BRAIN NATRIURETIC PEPTIDE: B Natriuretic Peptide: 258.4 pg/mL — ABNORMAL HIGH (ref 0.0–100.0)

## 2023-11-27 MED ORDER — NOREPINEPHRINE 16 MG/250ML-% IV SOLN
0.0000 ug/min | INTRAVENOUS | Status: DC
  Administered 2023-11-27 (×2): 15 ug/min via INTRAVENOUS
  Administered 2023-11-28 (×2): 3 ug/min via INTRAVENOUS
  Filled 2023-11-27 (×2): qty 250

## 2023-11-27 MED ORDER — DOCUSATE SODIUM 100 MG PO CAPS: 100.0000 mg | ORAL_CAPSULE | Freq: Two times a day (BID) | ORAL | Status: DC | PRN

## 2023-11-27 MED ORDER — INSULIN ASPART 100 UNIT/ML IJ SOLN
0.0000 [IU] | INTRAMUSCULAR | Status: DC
  Administered 2023-11-27 (×4): 7 [IU] via SUBCUTANEOUS
  Administered 2023-11-28: 4 [IU] via SUBCUTANEOUS
  Administered 2023-11-28 (×10): 7 [IU] via SUBCUTANEOUS
  Administered 2023-11-28 – 2023-11-29 (×2): 4 [IU] via SUBCUTANEOUS
  Administered 2023-11-29 (×2): 7 [IU] via SUBCUTANEOUS
  Administered 2023-11-29: 4 [IU] via SUBCUTANEOUS
  Administered 2023-11-29: 7 [IU] via SUBCUTANEOUS
  Administered 2023-11-29 (×2): 4 [IU] via SUBCUTANEOUS
  Administered 2023-11-29: 7 [IU] via SUBCUTANEOUS
  Administered 2023-11-30: 4 [IU] via SUBCUTANEOUS
  Administered 2023-11-30 (×10): 7 [IU] via SUBCUTANEOUS
  Administered 2023-11-30 – 2023-12-01 (×2): 4 [IU] via SUBCUTANEOUS
  Administered 2023-12-01: 11 [IU] via SUBCUTANEOUS
  Administered 2023-12-01: 4 [IU] via SUBCUTANEOUS
  Administered 2023-12-01: 11 [IU] via SUBCUTANEOUS
  Filled 2023-11-27 (×20): qty 1

## 2023-11-27 MED ORDER — POLYETHYLENE GLYCOL 3350 17 G PO PACK: 17.0000 g | PACK | Freq: Every day | ORAL | Status: DC | PRN

## 2023-11-27 MED ORDER — FENTANYL 2500MCG IN NS 250ML (10MCG/ML) PREMIX INFUSION
0.0000 ug/h | INTRAVENOUS | Status: DC
  Administered 2023-11-27 (×2): 25 ug/h via INTRAVENOUS
  Filled 2023-11-27: qty 250

## 2023-11-27 MED ORDER — SODIUM CHLORIDE 0.9 % IV BOLUS
1000.0000 mL | Freq: Once | INTRAVENOUS | Status: AC
  Administered 2023-11-27 (×2): 1000 mL via INTRAVENOUS

## 2023-11-27 MED ORDER — DOCUSATE SODIUM 50 MG/5ML PO LIQD
100.0000 mg | Freq: Two times a day (BID) | ORAL | Status: DC
  Administered 2023-11-28 – 2023-12-01 (×14): 100 mg
  Filled 2023-11-27 (×7): qty 10

## 2023-11-27 MED ORDER — MIDAZOLAM HCL (PF) 2 MG/2ML IJ SOLN: 1.0000 mg | INTRAMUSCULAR | Status: DC | PRN

## 2023-11-27 MED ORDER — CHLORHEXIDINE GLUCONATE CLOTH 2 % EX PADS
6.0000 | MEDICATED_PAD | Freq: Every day | CUTANEOUS | Status: DC
  Administered 2023-11-27 – 2023-12-01 (×10): 6 via TOPICAL
  Filled 2023-11-27: qty 6

## 2023-11-27 MED ORDER — PANTOPRAZOLE SODIUM 40 MG IV SOLR
40.0000 mg | Freq: Every day | INTRAVENOUS | Status: DC
  Administered 2023-11-27 – 2023-11-30 (×8): 40 mg via INTRAVENOUS
  Filled 2023-11-27 (×4): qty 10

## 2023-11-27 MED ORDER — IPRATROPIUM-ALBUTEROL 0.5-2.5 (3) MG/3ML IN SOLN
3.0000 mL | Freq: Four times a day (QID) | RESPIRATORY_TRACT | Status: DC
  Administered 2023-11-27 – 2023-11-28 (×12): 3 mL via RESPIRATORY_TRACT
  Filled 2023-11-27 (×6): qty 3

## 2023-11-27 MED ORDER — POTASSIUM CHLORIDE IN NACL 40-0.9 MEQ/L-% IV SOLN
INTRAVENOUS | Status: DC
  Filled 2023-11-27: qty 1000

## 2023-11-27 MED ORDER — PIPERACILLIN-TAZOBACTAM 3.375 G IVPB
3.3750 g | Freq: Three times a day (TID) | INTRAVENOUS | Status: DC
  Administered 2023-11-27 – 2023-12-01 (×24): 3.375 g via INTRAVENOUS
  Filled 2023-11-27 (×12): qty 50

## 2023-11-27 MED ORDER — ROCURONIUM BROMIDE 10 MG/ML (PF) SYRINGE
80.0000 mg | PREFILLED_SYRINGE | Freq: Once | INTRAVENOUS | Status: AC
  Administered 2023-11-27 (×2): 80 mg via INTRAVENOUS

## 2023-11-27 MED ORDER — POLYETHYLENE GLYCOL 3350 17 G PO PACK
17.0000 g | PACK | Freq: Every day | ORAL | Status: DC
  Administered 2023-11-28 – 2023-12-01 (×8): 17 g
  Filled 2023-11-27 (×4): qty 1

## 2023-11-27 MED ORDER — FENTANYL CITRATE (PF) 50 MCG/ML IJ SOSY: 50.0000 ug | PREFILLED_SYRINGE | INTRAMUSCULAR | Status: DC | PRN

## 2023-11-27 MED ORDER — FENTANYL CITRATE (PF) 50 MCG/ML IJ SOSY
50.0000 ug | PREFILLED_SYRINGE | INTRAMUSCULAR | Status: DC | PRN
  Administered 2023-11-27 (×2): 50 ug via INTRAVENOUS

## 2023-11-27 MED ORDER — PIPERACILLIN-TAZOBACTAM 3.375 G IVPB
3.3750 g | Freq: Once | INTRAVENOUS | Status: AC
  Administered 2023-11-27 (×2): 3.375 g via INTRAVENOUS
  Filled 2023-11-27: qty 50

## 2023-11-27 MED ORDER — NALOXONE HCL 2 MG/2ML IJ SOSY
1.0000 mg | PREFILLED_SYRINGE | Freq: Once | INTRAMUSCULAR | Status: AC
  Administered 2023-11-27 (×2): 1 mg via INTRAVENOUS

## 2023-11-27 MED ORDER — FENTANYL CITRATE (PF) 50 MCG/ML IJ SOSY
50.0000 ug | PREFILLED_SYRINGE | INTRAMUSCULAR | Status: DC | PRN
  Filled 2023-11-27 (×2): qty 1

## 2023-11-27 MED ORDER — SODIUM CHLORIDE 0.9 % IV SOLN
500.0000 mg | INTRAVENOUS | Status: AC
  Administered 2023-11-27 – 2023-11-29 (×6): 500 mg via INTRAVENOUS
  Filled 2023-11-27 (×3): qty 5

## 2023-11-27 MED ORDER — NOREPINEPHRINE 4 MG/250ML-% IV SOLN
0.0000 ug/min | INTRAVENOUS | Status: DC
  Administered 2023-11-27: 15 ug/min via INTRAVENOUS
  Administered 2023-11-27 (×2): 10 ug/min via INTRAVENOUS
  Administered 2023-11-27: 15 ug/min via INTRAVENOUS
  Filled 2023-11-27: qty 250

## 2023-11-27 MED ORDER — LACTATED RINGERS IV BOLUS
500.0000 mL | Freq: Once | INTRAVENOUS | Status: AC
  Administered 2023-11-27 (×2): 500 mL via INTRAVENOUS

## 2023-11-27 MED ORDER — ASPIRIN 300 MG RE SUPP
300.0000 mg | Freq: Once | RECTAL | Status: AC
  Administered 2023-11-27 (×2): 300 mg via RECTAL
  Filled 2023-11-27: qty 1

## 2023-11-27 MED ORDER — VANCOMYCIN HCL IN DEXTROSE 1-5 GM/200ML-% IV SOLN
1000.0000 mg | Freq: Once | INTRAVENOUS | Status: AC
  Administered 2023-11-27 (×2): 1000 mg via INTRAVENOUS
  Filled 2023-11-27 (×2): qty 200

## 2023-11-27 MED ORDER — KETAMINE HCL 50 MG/5ML IJ SOSY
1.0000 mg/kg | PREFILLED_SYRINGE | Freq: Once | INTRAMUSCULAR | Status: AC
  Administered 2023-11-27 (×2): 80 mg via INTRAVENOUS

## 2023-11-27 MED ORDER — MIDAZOLAM HCL (PF) 2 MG/2ML IJ SOLN
1.0000 mg | INTRAMUSCULAR | Status: DC | PRN
  Administered 2023-11-27 (×4): 2 mg via INTRAVENOUS
  Filled 2023-11-27 (×2): qty 2

## 2023-11-27 MED ORDER — SODIUM CHLORIDE 0.9 % IV SOLN
3.0000 g | Freq: Once | INTRAVENOUS | Status: DC
  Administered 2023-11-27 (×2): 3 g via INTRAVENOUS
  Filled 2023-11-27: qty 8

## 2023-11-27 MED ORDER — FENTANYL CITRATE (PF) 50 MCG/ML IJ SOSY
50.0000 ug | PREFILLED_SYRINGE | INTRAMUSCULAR | Status: DC | PRN
  Administered 2023-11-27 (×4): 50 ug via INTRAVENOUS
  Filled 2023-11-27: qty 1

## 2023-11-27 MED ORDER — VASOPRESSIN 20 UNITS/100 ML INFUSION FOR SHOCK
0.0000 [IU]/min | INTRAVENOUS | Status: DC
  Administered 2023-11-27 – 2023-11-28 (×8): 0.03 [IU]/min via INTRAVENOUS
  Filled 2023-11-27 (×5): qty 100

## 2023-11-27 NOTE — ED Notes (Signed)
 Bear huggars being used in other rms in ED. Called to PACU to obtain. Logan RN up to get bear huggar.

## 2023-11-27 NOTE — Consult Note (Signed)
 Pharmacy Antibiotic Note  [name] is a 73 y.o. female admitted on 11/27/2023 for respiratory distress. Pharmacy has been consulted for Zosyn dosing.  Patient unresponsive on admission and intubated. WBC 17.6, lactate 6.8, and hypothermic (94.7). Code sepsis called and was given once dose of zosyn 3.375g IV.   Cxr with patchy airspace opacities in the left lung base, possibly atelectasis or aspiration. CT with bilateral lower lobe airway thickening and lower lobe opacities which could reflect atelectasis or developing bronchopneumonia.  Renal function not at baseline. Scr 1.7 (bl  ~0.8-0.9, per profile 'marked for merge'.).   Plan: Ordered Zosyn 3.375g IV q8h (4h infusion)  Weight: 80 kg (176 lb 5.9 oz)  Temp (24hrs), Avg:95.7 F (35.4 C), Min:94 F (34.4 C), Max:96.8 F (36 C)  Recent Labs  Lab 11/27/23 1013 11/27/23 1118 11/27/23 1130 11/27/23 1246  WBC 17.6*  --   --   --   CREATININE  --   --  1.70*  --   LATICACIDVEN  --  6.8*  --  6.0*    CrCl cannot be calculated (Unknown ideal weight.).    Not on File  Antimicrobials this admission: Zosyn 10/22 >>  Azithromycin  10/22>>10/24  Dose adjustments this admission: N/a  Microbiology results: 10/22 BCx: pending 10/22 aspirate: pending 10/22 MRSA PCR: pending 10/22 respiratory panel: pending  Thank you for allowing pharmacy to be a part of this patient's care.  Leonor BROCKS Elloise Roark 11/27/2023 2:23 PM

## 2023-11-27 NOTE — Sepsis Progress Note (Signed)
 eLink is following this Code Sepsis.

## 2023-11-27 NOTE — ED Notes (Signed)
 Ampicillin d/c

## 2023-11-27 NOTE — ED Notes (Addendum)
 Lab called at this time for green top hemolyzed blood sample. Sample redrawn and sent.

## 2023-11-27 NOTE — ED Provider Notes (Signed)
 At request of Dr. Willo, I was present at the bedside for arrival of the patient due to concerns of potentially severely difficult airway based on EMS incurred.  The patient is unresponsive has a strong palpable carotid and radial pulse.  Dr. Willo manage airway, patient was intubated without desaturation without complication.  Positive end-tidal CO2.  I personally witnessed and assisted in intubation, watching endotracheal tube passed through the vocal cords.  Lung sounds are rhonchorous.  Fairly thick mucousy secretions that required suctioning. + ETCO2 color on multiple breaths.   Ongoing care by Dr. Willo.  Patient with pulse, hypotension, fluid boluses running and pressors being initiated.  CRITICAL CARE Performed by: Oneil Budge   Total critical care time: 15 minutes  Critical care time was exclusive of separately billable procedures and treating other patients.  Critical care was necessary to treat or prevent imminent or life-threatening deterioration.  Critical care was time spent personally by me on the following activities: development of treatment plan with patient and/or surrogate as well as nursing, discussions with consultants, evaluation of patient's response to treatment, examination of patient, obtaining history from patient or surrogate, ordering and performing treatments and interventions, ordering and review of laboratory studies, ordering and review of radiographic studies, pulse oximetry and re-evaluation of patient's condition.      Budge Oneil, MD 11/27/23 1007

## 2023-11-27 NOTE — Procedures (Signed)
 Arterial Catheter Insertion Procedure Note  [name]  [id number]  Jan 23, 1951  Date:11/27/23  Time:11:27 PM   Patient's original A-line stopped functioning, BP cuff has not been correlating. A-line replaced urgently.  Provider Performing: Jenita CROME Rust-Chester    Procedure: Insertion of Arterial Line (63379) with US  guidance (23062)   Indication(s) Blood pressure monitoring and/or need for frequent ABGs  Consent Unable to obtain consent due to emergent nature of procedure.  Anesthesia Fentanyl  drip running   Time Out Verified patient identification, verified procedure, site/side was marked, verified correct patient position, special equipment/implants available, medications/allergies/relevant history reviewed, required imaging and test results available.   Sterile Technique Maximal sterile technique including full sterile barrier drape, hand hygiene, sterile gown, sterile gloves, mask, hair covering, sterile ultrasound probe cover (if used).   Procedure Description Area of catheter insertion was cleaned with chlorhexidine  and draped in sterile fashion. With real-time ultrasound guidance an arterial catheter was placed into the right radial artery.  Appropriate arterial tracings confirmed on monitor.     Complications/Tolerance None; patient tolerated the procedure well.   EBL Minimal   Specimen(s) None   Jenita Jama Meek, AGACNP-BC Acute Care Nurse Practitioner [facility]   [phone] / [phone] Please see Amion for details.

## 2023-11-27 NOTE — Progress Notes (Signed)
 Transported to MRI and back with no events.

## 2023-11-27 NOTE — IPAL (Signed)
  Interdisciplinary Goals of Care Family Meeting   Date carried out: 11/27/2023  Location of the meeting: Conference room   Member's involved: Nurse Practitioner and Family Member or next of kin  Durable Power of Attorney or Environmental health practitioner: pt's 2 sons and daughter in law  Discussion: We discussed goals of care for [name] .  We reviewed clinical course including acute metabolic encephalopathy and acute on chronic hypoxic and hypercapnic respiratory failure, sepsis due to suspected aspiration pneumonia, and acute kidney injury requiring intubation and mechanical ventilation.  Found to have acute to subacute left MCA infarcts.   Plan to obtain Neuro consult and MRI Brain.  Continuing treating pneumonia and support shock with vasopressors.  They are in agreement and give consent for placement of both central and arterial lines.   They would like to continue current interventions and allow time for outcomes.    Code status:   Code Status: Full Code   Disposition: Continue current acute care  Time spent for the meeting: 15 minutes      [name], AGACNP-BC [facility] Prefer epic messenger for cross cover needs If after hours, please call E-link   Jill JONETTA Lecher, NP  11/27/2023, 3:22 PM

## 2023-11-27 NOTE — ED Notes (Signed)
 Delay in meds and blood work. Emergent care started in another rm.

## 2023-11-27 NOTE — ED Triage Notes (Signed)
 Pt in via [facility]. Pt found unresponsive by staff at apartment. Unknown how long the pt has been down. Concerns for tongue swelling per EMS. Epi given. Pt unresponsive at bedside.

## 2023-11-27 NOTE — Procedures (Signed)
 Arterial Catheter Insertion Procedure Note  [name]  [id number]  1950-12-07  Date:11/27/23  Time:5:56 PM    Provider Performing: Robet Kim    Procedure: Insertion of Arterial Line (63379) with US  guidance (23062)   Indication(s) Blood pressure monitoring and/or need for frequent ABGs  Consent Risks of the procedure as well as the alternatives and risks of each were explained to the patient and/or caregiver.  Consent for the procedure was obtained and is signed in the bedside chart  Anesthesia None   Time Out Verified patient identification, verified procedure, site/side was marked, verified correct patient position, special equipment/implants available, medications/allergies/relevant history reviewed, required imaging and test results available.   Sterile Technique Maximal sterile technique including full sterile barrier drape, hand hygiene, sterile gown, sterile gloves, mask, hair covering, sterile ultrasound probe cover (if used).   Procedure Description Area of catheter insertion was cleaned with chlorhexidine  and draped in sterile fashion. With real-time ultrasound guidance an arterial catheter was placed into the left radial artery.  Appropriate arterial tracings confirmed on monitor.     Complications/Tolerance None; patient tolerated the procedure well.   EBL Minimal   Specimen(s) None   Robet Kim, PA-C Pulmonary/Critical Care PCCM Team Contact Info: [phone]

## 2023-11-27 NOTE — ED Notes (Signed)
 Attempted OG tube several times without success. RN logan attempted several times with no success.

## 2023-11-27 NOTE — Procedures (Signed)
 Central Venous Catheter Insertion Procedure Note  [name]  [id number]  Jun 17, 1950  Date:11/27/23  Time:5:02 PM   Provider Performing:Broxton Broady   Procedure: Insertion of Non-tunneled Central Venous Catheter(36556) with US  guidance (23062)   Indication(s) Medication administration  Consent Risks of the procedure as well as the alternatives and risks of each were explained to the patient and/or caregiver.  Consent for the procedure was obtained and is signed in the bedside chart  Anesthesia None required  Timeout Verified patient identification, verified procedure, site/side was marked, verified correct patient position, special equipment/implants available, medications/allergies/relevant history reviewed, required imaging and test results available.  Sterile Technique Maximal sterile technique including full sterile barrier drape, hand hygiene, sterile gown, sterile gloves, mask, hair covering, sterile ultrasound probe cover (if used).  Procedure Description Area of catheter insertion was cleaned with chlorhexidine  and draped in sterile fashion.  With real-time ultrasound guidance a central venous catheter was placed into the right internal jugular vein. Nonpulsatile blood flow and easy flushing noted in all ports.  The catheter was sutured in place and sterile dressing applied.  Complications/Tolerance None; patient tolerated the procedure well. Chest X-ray is ordered to verify placement for internal jugular or subclavian cannulation.   Chest x-ray is not ordered for femoral cannulation.  EBL Minimal  Specimen(s) None   Robet Kim, PA-C Pulmonary/Critical Care PCCM Team Contact Info: [phone]

## 2023-11-27 NOTE — ED Provider Notes (Signed)
 [facility] Provider Note    Event Date/Time   First MD Initiated Contact with Patient 11/27/23 1012     (approximate)   History   Chief Complaint Respiratory Distress   HPI  [name] is a 73 y.o. female with past medical history of hypertension, COPD, chronic hypoxic respiratory failure on 2 L, hypothyroidism, and anxiety who presents to the ED for altered mental status.  Patient was reportedly found down in bed this morning by family, minimally responsive at that time.  EMS was contacted and noted patient to be hypoxic to the 50s on room air, placed on nonrebreather with partial improvement into the 70s.  There was concern that she had tongue swelling and possible allergic reaction, patient given IM epinephrine  by EMS.  On arrival to the ED, patient is unresponsive.     Physical Exam   Triage Vital Signs: ED Triage Vitals  Encounter Vitals Group     BP 11/27/23 1001 (!) 84/38     Girls Systolic BP Percentile --      Girls Diastolic BP Percentile --      Boys Systolic BP Percentile --      Boys Diastolic BP Percentile --      Pulse Rate 11/27/23 1001 93     Resp 11/27/23 1001 16     Temp --      Temp src --      SpO2 11/27/23 1001 100 %     Weight 11/27/23 1000 176 lb 5.9 oz (80 kg)     Height --      Head Circumference --      Peak Flow --      Pain Score --      Pain Loc --      Pain Education --      Exclude from Growth Chart --     Most recent vital signs: Vitals:   11/27/23 1150 11/27/23 1200  BP: (!) 115/52 (!) 98/44  Pulse: 82 82  Resp: 15 15  Temp: (!) 95.2 F (35.1 C) (!) 95.3 F (35.2 C)  SpO2: 99% 96%    Constitutional: Somnolent with gagging respirations. Eyes: Pupils pinpoint and minimally reactive bilaterally. Head: Atraumatic. Nose: No congestion/rhinnorhea. Mouth/Throat: Mucous membranes are moist.  Posterior oropharynx with large amount of thick white mucus. Cardiovascular: Normal rate, regular rhythm.  Grossly normal heart sounds.  2+ radial pulses bilaterally. Respiratory: Gagging respirations with rales throughout. Gastrointestinal: Soft and nondistended, laparoscopic surgical sites are intact. Musculoskeletal: No lower extremity edema.  Neurologic: No spontaneous movements noted, does not withdraw from painful stimuli.    ED Results / Procedures / Treatments   Labs (all labs ordered are listed, but only abnormal results are displayed) Labs Reviewed  BLOOD GAS, VENOUS - Abnormal; Notable for the following components:      Result Value   pH, Ven 7.1 (*)    pCO2, Ven 81 (*)    pO2, Ven 74 (*)    Acid-base deficit 6.4 (*)    All other components within normal limits  CBC WITH DIFFERENTIAL/PLATELET - Abnormal; Notable for the following components:   WBC 17.6 (*)    MCHC 28.7 (*)    Neutro Abs 12.7 (*)    Monocytes Absolute 2.4 (*)    Abs Immature Granulocytes 0.22 (*)    All other components within normal limits  LACTIC ACID, PLASMA - Abnormal; Notable for the following components:   Lactic Acid, Venous 6.8 (*)  All other components within normal limits  BRAIN NATRIURETIC PEPTIDE - Abnormal; Notable for the following components:   B Natriuretic Peptide 258.4 (*)    All other components within normal limits  URINALYSIS, ROUTINE W REFLEX MICROSCOPIC - Abnormal; Notable for the following components:   Color, Urine YELLOW (*)    APPearance CLEAR (*)    All other components within normal limits  URINE DRUG SCREEN, QUALITATIVE ([facility] ONLY) - Abnormal; Notable for the following components:   Benzodiazepine, Ur Scrn POSITIVE (*)    All other components within normal limits  COMPREHENSIVE METABOLIC PANEL WITH GFR - Abnormal; Notable for the following components:   CO2 19 (*)    Glucose, Bld 256 (*)    BUN 29 (*)    Creatinine, Ser 1.70 (*)    Calcium  8.5 (*)    Albumin 3.2 (*)    AST 127 (*)    ALT 58 (*)    GFR, Estimated 31 (*)    Anion gap 23 (*)    All other components  within normal limits  CBG MONITORING, ED - Abnormal; Notable for the following components:   Glucose-Capillary 275 (*)    All other components within normal limits  TROPONIN I (HIGH SENSITIVITY) - Abnormal; Notable for the following components:   Troponin I (High Sensitivity) 48 (*)    All other components within normal limits  TROPONIN I (HIGH SENSITIVITY) - Abnormal; Notable for the following components:   Troponin I (High Sensitivity) 82 (*)    All other components within normal limits  CULTURE, BLOOD (ROUTINE X 2)  CULTURE, BLOOD (ROUTINE X 2)  CULTURE, RESPIRATORY W GRAM STAIN  MRSA NEXT GEN BY PCR, NASAL  LACTIC ACID, PLASMA  TSH  T4, FREE  PROCALCITONIN  STREP PNEUMONIAE URINARY ANTIGEN  LEGIONELLA PNEUMOPHILA SEROGP 1 UR AG     EKG  ED ECG REPORT I, Carlin Palin, the attending physician, personally viewed and interpreted this ECG.   Date: 11/27/2023  EKG Time: 10:20  Rate: 94  Rhythm: normal sinus rhythm  Axis: Normal  Intervals:none  ST&T Change: None  RADIOLOGY Chest x-ray reviewed and interpreted by me with left lower lobe infiltrate, ET tube in appropriate position.  PROCEDURES:  Critical Care performed: Yes, see critical care procedure note(s)  Procedure Name: Intubation Date/Time: 11/27/2023 10:28 AM  Performed by: Palin Carlin, MDPre-anesthesia Checklist: Patient identified, Patient being monitored, Emergency Drugs available, Timeout performed and Suction available Oxygen  Delivery Method: Non-rebreather mask Preoxygenation: Pre-oxygenation with 100% oxygen  Induction Type: Rapid sequence Ventilation: Mask ventilation without difficulty Laryngoscope Size: Glidescope and 4 Grade View: Grade I Tube size: 7.5 mm Number of attempts: 1 Airway Equipment and Method: Video-laryngoscopy Placement Confirmation: ETT inserted through vocal cords under direct vision, CO2 detector and Breath sounds checked- equal and bilateral Secured at: 21 cm Tube  secured with: ETT holder Dental Injury: Teeth and Oropharynx as per pre-operative assessment     .Critical Care  Performed by: Palin Carlin, MD Authorized by: Palin Carlin, MD   Critical care provider statement:    Critical care time (minutes):  30   Critical care time was exclusive of:  Separately billable procedures and treating other patients and teaching time   Critical care was necessary to treat or prevent imminent or life-threatening deterioration of the following conditions:  Respiratory failure and shock   Critical care was time spent personally by me on the following activities:  Development of treatment plan with patient or surrogate, discussions with consultants, evaluation of  patient's response to treatment, examination of patient, ordering and review of laboratory studies, ordering and review of radiographic studies, ordering and performing treatments and interventions, pulse oximetry, re-evaluation of patient's condition and review of old charts   I assumed direction of critical care for this patient from another provider in my specialty: no     Care discussed with: admitting provider      MEDICATIONS ORDERED IN ED: Medications  norepinephrine (LEVOPHED) 4mg  in (0.016 mg/mL) premix infusion (11 mcg/min Intravenous Rate/Dose Change 11/27/23 1221)  fentaNYL  (SUBLIMAZE ) injection 50 mcg (has no administration in time range)  vancomycin (VANCOCIN) IVPB 1000 mg/200 mL premix (has no administration in time range)  Chlorhexidine  Gluconate Cloth 2 % PADS 6 each (has no administration in time range)  docusate sodium  (COLACE) capsule 100 mg (has no administration in time range)  polyethylene glycol (MIRALAX  / GLYCOLAX ) packet 17 g (has no administration in time range)  pantoprazole  (PROTONIX ) injection 40 mg (has no administration in time range)  docusate (COLACE) 50 MG/5ML liquid 100 mg (has no administration in time range)  polyethylene glycol (MIRALAX  / GLYCOLAX )  packet 17 g (has no administration in time range)  fentaNYL  (SUBLIMAZE ) injection 50 mcg (has no administration in time range)  fentaNYL  (SUBLIMAZE ) injection 50-200 mcg (has no administration in time range)  midazolam  PF (VERSED ) injection 1-2 mg (has no administration in time range)  rocuronium  (ZEMURON ) injection 80 mg (80 mg Intravenous Given 11/27/23 1002)  naloxone  (NARCAN ) injection 1 mg (1 mg Intravenous Given 11/27/23 0959)  ketamine  50 mg in normal saline 5 mL (10 mg/mL) syringe (80 mg Intravenous Given 11/27/23 1000)  sodium chloride  0.9 % bolus 1,000 mL (0 mLs Intravenous Stopped 11/27/23 1035)  sodium chloride  0.9 % bolus 1,000 mL (0 mLs Intravenous Stopped 11/27/23 1035)  piperacillin-tazobactam (ZOSYN) IVPB 3.375 g (3.375 g Intravenous New Bag/Given 11/27/23 1216)     IMPRESSION / MDM / ASSESSMENT AND PLAN / ED COURSE  I reviewed the triage vital signs and the nursing notes.                              73 y.o. female with past medical history of hypertension, COPD, chronic hypoxic respiratory failure on 2 L, hypothyroidism, and anxiety who presents to the ED for altered mental status after she was found unresponsive this morning with concern for tongue swelling.  Patient's presentation is most consistent with acute presentation with potential threat to life or bodily function.  Differential diagnosis includes, but is not limited to, stroke, intracranial hemorrhage, hypoxic respiratory failure, hypercapnia, COPD exacerbation, CHF, ACS, PE, pneumonia, aspiration, anemia, electrolyte abnormality, seizure, sepsis, UTI, polypharmacy, angioedema, anaphylaxis.  Patient ill-appearing on arrival, somnolent and unresponsive to painful stimuli, gagging respirations noted with significant thick white secretions in the posterior oropharynx.  While there was concern for tongue swelling per EMS, I do not appreciate significant edema around her lips or tongue at this time and relatively low  suspicion for angioedema or allergic reaction.  Given pinpoint pupils, patient given 1 mg of IV Narcan  without change.  Decision was made to proceed with intubation, which was performed without difficulty following suction of secretions.  Patient did become significantly hypotensive following intubation, was given 2 L of IV fluid bolus and started on Levophed with improvement.  EKG without evidence of arrhythmia or ischemia, VBG does appear to show significant hypercapnic respiratory failure.  Chest x-ray is pending, will also check CT  head, CTA chest, and CT abdomen/pelvis given her recent hernia repair.  Labs with significant leukocytosis, chest x-ray shows appropriate positioning of ET tube with evidence of possible aspiration, will cover with broad-spectrum IV antibiotics.  Additional labs with AKI but no significant electrolyte abnormality, mild transaminitis noted likely due to shock on presentation.  Troponin also mildly elevated, but suspect this is related to shock with elevation in lactic acid likely reflecting prolonged downtime.  Case discussed with ICU team for admission, they recommend proceeding with CT head as well as CT abdomen/pelvis without contrast.  These results are pending at the time of admission to ICU team.      FINAL CLINICAL IMPRESSION(S) / ED DIAGNOSES   Final diagnoses:  Altered mental status, unspecified altered mental status type  Acute respiratory failure with hypoxia and hypercapnia (HCC)  Sepsis, due to unspecified organism, unspecified whether acute organ dysfunction present [facility])  Aspiration into airway, initial encounter     Rx / DC Orders   ED Discharge Orders     None        Note:  This document was prepared using Dragon voice recognition software and may include unintentional dictation errors.   Willo Dunnings, MD 11/27/23 1250

## 2023-11-27 NOTE — ED Notes (Signed)
 Report called

## 2023-11-27 NOTE — ED Notes (Signed)
Bear huggar applied

## 2023-11-27 NOTE — Progress Notes (Signed)
 CODE SEPSIS - PHARMACY COMMUNICATION  **Broad Spectrum Antibiotics should be administered within 1 hour of Sepsis diagnosis**  Time Code Sepsis Called/Page Received: 1154  Antibiotics Ordered: Zosyn & vancomycin  Time of 1st antibiotic administration: 1216  Additional action taken by pharmacy: N/A  [name] 11/27/2023  12:07 PM

## 2023-11-27 NOTE — H&P (Addendum)
 NAME:  [name], MRN:  [id number], DOB:  [birth year], LOS: 0 ADMISSION DATE:  11/27/2023, CONSULTATION DATE:  11/27/2023 REFERRING MD:  Dr. Willo, CHIEF COMPLAINT:  Unresponsive, hypoxia    Brief Pt Description / Synopsis:  73 year old female with past medical history significant for COPD on 2 L supplemental oxygen , hypothyroidism and recent robotic assisted umbilical hernia repair on 11/25/23, who is admitted with acute metabolic encephalopathy and acute on chronic hypoxic and hypercapnic respiratory failure, sepsis due to suspected aspiration pneumonia, and acute kidney injury requiring intubation and mechanical ventilation.  Found to have acute to subacute left MCA infarcts.   History of Present Illness:  [name] is a 73 year old female with a past medical history significant for COPD, chronic hypoxic respiratory failure requiring 2 L supplemental oxygen , hypothyroidism, hypertension, and anxiety who presents to [facility] ED on 11/27/2023 for evaluation of altered mental status.  Patient is currently altered and on the ventilator and unable to contribute to history, and no family currently available, therefore history is obtained from chart review.  It is reported that she was found down and minimally responsive by family earlier this morning.  She underwent robotic assisted umbilical hernia repair surgery on 11/25/2023 by Dr Marinda and was discharged home yesterday 10/21.  Her last known well time was approximately 8:00 PM last night night prior to bedtime, where her son talked to her over the telephone and said she sounded fine.  On his way home from work this morning, he attempted to call her of which she did not answer.  He went by her house to check on her and she did not come to the door.   Upon EMS arrival she was noted to be hypoxic with sats in the 50s on room air which she was subsequent placed on nonrebreather with improvement of saturations to the 70s.  There was also concern for  possible tongue swelling which she was given IM epinephrine  by EMS.  On arrival to the ED she remained unresponsive  and somnolent, with gurgling respirations and with significant thick white secretions in the posterior oropharynx.  She required emergent intubation by ED provider.  ED provider did not appreciate any significant tongue/lip, or airway edema to suggest angioedema or anaphylaxis.   ED Course: Initial Vital Signs: Temperature 94.7 F, pulse 93, respiratory rate 16, blood pressure 84/38, SpO2 100% via ventilator Significant Labs: BNP 258, high-sensitivity troponin 48, WBC 17.6, lactic acid 6.8, TSH 1.8, bicarbonate 19, anion gap 23, BUN 29, creatinine 1.7, AST 127, ALT 58, glucose 256 VBG: pH 7.1/pCO2 81/pO2 74/bicarb 25.1 Urinalysis negative for UTI Urine drug screen positive for benzodiazepines Imaging Chest X-ray>>IMPRESSION: 1. Patchy airspace opacities in the left lung base, possibly atelectasis or aspiration. Trace left pleural effusion. 2. Well-positioned endotracheal tube. CT Head>>IMPRESSION: 1. Acute to early subacute left MCA infarcts. No hemorrhage. CT Abdomen & Pelvis wo contrast>>IMPRESSION: Apparent abdominal wall wound to the left of midline with underlying subcutaneous air extending laterally in the left abdominal wall. Question recent surgical procedure. If no recent procedure, this would be concerning for possible infection. No drainable focal fluid collection. Colonic diverticulosis.  No active diverticulitis. Stable 4.5 cm left ovarian cyst. Aortic atherosclerosis. Trace bilateral pleural effusions with bilateral lower lobe airway thickening and lower lobe opacities which could reflect atelectasis or developing bronchopneumonia. Medications Administered: 2 L IV fluid boluses, peripheral Levophed started, IV vancomycin and Zosyn  PCCM asked to admit for further workup and treatment.  Please see Significant Hospital Events  section below for full detailed  hospital course.   Pertinent  Medical History   Past Medical History:  Diagnosis Date   COPD (chronic obstructive pulmonary disease) (HCC)     Micro Data:  10/22: Blood cultures x 2>> 10/22: COVID/flu/RSV PCR>> 10/22: Respiratory viral panel>> 10/22: Tracheal aspirate>> 10/22: Strep pneumo urinary antigen>> 10/22: Legionella urinary antigen>>  Antimicrobials:   Anti-infectives (From admission, onward)    Start     Dose/Rate Route Frequency Ordered Stop   11/27/23 1315  azithromycin  (ZITHROMAX ) 500 mg in sodium chloride  0.9 % 250 mL IVPB        500 mg 250 mL/hr over 60 Minutes Intravenous Every 24 hours 11/27/23 1308 11/30/23 1314   11/27/23 1215  piperacillin-tazobactam (ZOSYN) IVPB 3.375 g        3.375 g 100 mL/hr over 30 Minutes Intravenous  Once 11/27/23 1212 11/27/23 1246   11/27/23 1215  vancomycin (VANCOCIN) IVPB 1000 mg/200 mL premix        1,000 mg 200 mL/hr over 60 Minutes Intravenous  Once 11/27/23 1212     11/27/23 1200  Ampicillin-Sulbactam (UNASYN) 3 g in sodium chloride  0.9 % 100 mL IVPB  Status:  Discontinued        3 g 200 mL/hr over 30 Minutes Intravenous  Once 11/27/23 1155 11/27/23 1212       Significant Hospital Events: Including procedures, antibiotic start and stop dates in addition to other pertinent events   10/22: Presented to ED unresponsive and hypoxic requiring emergent intubation by ED provider.  Postintubation with hypotension requiring IV fluids and initiation of peripheral Levophed.  PCCM asked to admit.  CT Head concerning for acute to subacute Left MCA infarcts, Neurology consulted.   Interim History / Subjective:  As outlined above under Significant Hospital Events section  Objective   Blood pressure (!) 98/44, pulse 82, temperature (!) 95.3 F (35.2 C), resp. rate 15, weight 80 kg, SpO2 96%.    Vent Mode: PRVC FiO2 (%):  [60 %] 60 % Set Rate:  [15 bmp] 15 bmp Vt Set:  [400 mL] 400 mL PEEP:  [8 cmH20] 8 cmH20   Intake/Output  Summary (Last 24 hours) at 11/27/2023 1250 Last data filed at 11/27/2023 1212 Gross per 24 hour  Intake 1.31 ml  Output --  Net 1.31 ml   Filed Weights   11/27/23 1000  Weight: 80 kg    Examination: General: Critically ill-appearing on chronically ill-appearing obese female, laying in bed, intubated and sedated, no acute distress HENT: Atraumatic, normocephalic, neck supple, difficult to assess JVD due to body habitus, orally intubated Lungs: Coarse breath sounds throughout, even, nonlabored, synchronous with the vent Cardiovascular: Regular rate and rhythm, S1-S2, no murmurs, rubs, gallops Abdomen: Limited abdominal exam due to altered mental status and intubation: Obese, nontender, no guarding or rebound tenderness, bowel sounds hypoactive, 3 small incisions to abdomen from recent hernia repair which are all dermabonded clean dry and intact Extremities: Normal bulk and tone, no deformities, no edema Neuro: Currently unresponsive, PERRLA (2 mm and sluggish bilaterally), currently no cough/gag reflexes, currently not overbreathing the vent, currently does not withdraw from painful stimuli GU: Deferred  Resolved Hospital Problem list     Assessment & Plan:   #Shock: suspect Septic #Mildly Elevated Troponin, suspect demand ischemia  PMHx: HTN -Continuous cardiac monitoring -Maintain MAP >65 -IV fluids -Vasopressors as needed to maintain MAP goal -Trend lactic acid until normalized -Trend HS Troponin until peaked -Echocardiogram pending  #Acute on Chronic Hypoxic &  Hypercapnic Respiratory Failure due to  #Suspected Pneumonia: Aspiration vs CAP  #COPD without acute exacerbation  PMHx: COPD on 2L supplemental O2 -Full vent support, implement lung protective strategies -Plateau pressures less than 30 cm H20 -Wean FiO2 & PEEP as tolerated to maintain O2 sats 88 to 92% -Follow intermittent Chest X-ray & ABG as needed -Spontaneous Breathing Trials when respiratory parameters met  and mental status permits -Implement VAP Bundle -Bronchodilators -ABX as above  #Severe Sepsis (Met SIRS Criteria on presentation: Temp. 94.7, Pulse 93, WBC 17.6) due to ... #Concern for Pneumonia: Aspiration vs CAP #Questionable intraabdominal process, low suspicion  PMHx: recent robotic assisted laparoscopic umbilical hernia repair on 11/25/23 -Monitor fever curve -Trend WBC's & Procalcitonin -Follow cultures as above -Continue empiric Azithromycin  & Zosyn pending cultures & sensitivities -CT Abdomen & Pelvis reviewed by Dr. Marinda ~ felt nothing acute on imaging to explain her decompensation   #AKI #Anion Gap Metabolic Acidosis #Lactic Acidosis  -Monitor I&O's / urinary output -Follow BMP -Ensure adequate renal perfusion -Avoid nephrotoxic agents as able -Replace electrolytes as indicated ~ Pharmacy following for assistance with electrolyte replacement -IV Fluids  -Consider Renal US   #Diabetes Mellitus with Hyperglycemia -CBG's q4h; Target range of 140 to 180 -SSI -Follow ICU Hypo/Hyperglycemia protocol  #Acute Metabolic Encephalopathy #Acute to Subacute Left MCA Infarcts  #CO2 Narcosis #Sedation needs in setting of mechanical ventilation PMHx: Depression, anxiety  -UDS + for benzodiazepines  -Treatment of metabolic derangements and Hypercapnia as outlined above  -Maintain a RASS goal of 0 to -1 -Fentanyl  and Versed  pushes prn to maintain RASS goal -Avoid sedating medications as able -Daily wake up assessment -TSH normal at 1.8 -Consult Neurology, appreciate input       Best Practice (right click and Reselect all SmartList Selections daily)   Diet/type: NPO DVT prophylaxis: SCD GI prophylaxis: PPI Lines: N/A Foley:  N/A Code Status:  full code Last date of multidisciplinary goals of care discussion [N/A]  10/22: Will update family when they arrive bedside on plan of care.  Labs   CBC: Recent Labs  Lab 11/27/23 1013  WBC 17.6*  NEUTROABS 12.7*   HGB 12.3  HCT 42.8  MCV 100.0  PLT 333    Basic Metabolic Panel: Recent Labs  Lab 11/27/23 1130  NA 142  K 3.8  CL 100  CO2 19*  GLUCOSE 256*  BUN 29*  CREATININE 1.70*  CALCIUM  8.5*   GFR: CrCl cannot be calculated (Unknown ideal weight.). Recent Labs  Lab 11/27/23 1013 11/27/23 1118  WBC 17.6*  --   LATICACIDVEN  --  6.8*    Liver Function Tests: Recent Labs  Lab 11/27/23 1130  AST 127*  ALT 58*  ALKPHOS 71  BILITOT 0.8  PROT 6.8  ALBUMIN 3.2*   No results for input(s): LIPASE, AMYLASE in the last 168 hours. No results for input(s): AMMONIA in the last 168 hours.  ABG    Component Value Date/Time   HCO3 25.1 11/27/2023 1015   ACIDBASEDEF 6.4 (H) 11/27/2023 1015   O2SAT 94.8 11/27/2023 1015     Coagulation Profile: No results for input(s): INR, PROTIME in the last 168 hours.  Cardiac Enzymes: No results for input(s): CKTOTAL, CKMB, CKMBINDEX, TROPONINI in the last 168 hours.  HbA1C: No results found for: HGBA1C  CBG: Recent Labs  Lab 11/27/23 0958  GLUCAP 275*    Review of Systems:   Unable to assess due to AMS/intubation/sedation/critical illness   Past Medical History:  She,  has a past  medical history of COPD (chronic obstructive pulmonary disease) (HCC).   Surgical History:     Social History:      Family History:  Her family history is not on file.   Allergies Not on File   Home Medications  Prior to Admission medications   Not on File     Critical care time: 60 minutes     Inge Lecher, AGACNP-BC  Pulmonary & Critical Care Prefer epic messenger for cross cover needs If after hours, please call E-link

## 2023-11-28 ENCOUNTER — Inpatient Hospital Stay

## 2023-11-28 DIAGNOSIS — I480 Paroxysmal atrial fibrillation: Secondary | ICD-10-CM

## 2023-11-28 DIAGNOSIS — R4189 Other symptoms and signs involving cognitive functions and awareness: Secondary | ICD-10-CM

## 2023-11-28 DIAGNOSIS — I639 Cerebral infarction, unspecified: Secondary | ICD-10-CM

## 2023-11-28 DIAGNOSIS — J9601 Acute respiratory failure with hypoxia: Secondary | ICD-10-CM

## 2023-11-28 DIAGNOSIS — A419 Sepsis, unspecified organism: Secondary | ICD-10-CM

## 2023-11-28 DIAGNOSIS — R4182 Altered mental status, unspecified: Secondary | ICD-10-CM

## 2023-11-28 DIAGNOSIS — J9602 Acute respiratory failure with hypercapnia: Secondary | ICD-10-CM

## 2023-11-28 DIAGNOSIS — K420 Umbilical hernia with obstruction, without gangrene: Secondary | ICD-10-CM | POA: Diagnosis not present

## 2023-11-28 DIAGNOSIS — Z7189 Other specified counseling: Secondary | ICD-10-CM | POA: Diagnosis not present

## 2023-11-28 DIAGNOSIS — J9621 Acute and chronic respiratory failure with hypoxia: Secondary | ICD-10-CM | POA: Diagnosis not present

## 2023-11-28 DIAGNOSIS — I4891 Unspecified atrial fibrillation: Secondary | ICD-10-CM

## 2023-11-28 DIAGNOSIS — J690 Pneumonitis due to inhalation of food and vomit: Secondary | ICD-10-CM | POA: Diagnosis not present

## 2023-11-28 DIAGNOSIS — I63511 Cerebral infarction due to unspecified occlusion or stenosis of right middle cerebral artery: Secondary | ICD-10-CM

## 2023-11-28 DIAGNOSIS — R7989 Other specified abnormal findings of blood chemistry: Secondary | ICD-10-CM | POA: Diagnosis not present

## 2023-11-28 DIAGNOSIS — J9622 Acute and chronic respiratory failure with hypercapnia: Secondary | ICD-10-CM

## 2023-11-28 DIAGNOSIS — R6520 Severe sepsis without septic shock: Secondary | ICD-10-CM

## 2023-11-28 DIAGNOSIS — I63413 Cerebral infarction due to embolism of bilateral middle cerebral arteries: Secondary | ICD-10-CM | POA: Diagnosis not present

## 2023-11-28 DIAGNOSIS — R579 Shock, unspecified: Secondary | ICD-10-CM | POA: Diagnosis not present

## 2023-11-28 DIAGNOSIS — J449 Chronic obstructive pulmonary disease, unspecified: Secondary | ICD-10-CM

## 2023-11-28 DIAGNOSIS — G9341 Metabolic encephalopathy: Secondary | ICD-10-CM

## 2023-11-28 DIAGNOSIS — N179 Acute kidney failure, unspecified: Secondary | ICD-10-CM

## 2023-11-28 LAB — CBC
HCT: 35.9 % — ABNORMAL LOW (ref 36.0–46.0)
Hemoglobin: 10.7 g/dL — ABNORMAL LOW (ref 12.0–15.0)
MCH: 28.5 pg (ref 26.0–34.0)
MCHC: 29.8 g/dL — ABNORMAL LOW (ref 30.0–36.0)
MCV: 95.7 fL (ref 80.0–100.0)
Platelets: 213 K/uL (ref 150–400)
RBC: 3.75 MIL/uL — ABNORMAL LOW (ref 3.87–5.11)
RDW: 14.5 % (ref 11.5–15.5)
WBC: 12.5 K/uL — ABNORMAL HIGH (ref 4.0–10.5)
nRBC: 0 % (ref 0.0–0.2)

## 2023-11-28 LAB — HEMOGLOBIN A1C
Hgb A1c MFr Bld: 6.8 % — ABNORMAL HIGH (ref 4.8–5.6)
Mean Plasma Glucose: 148.46 mg/dL

## 2023-11-28 LAB — ECHOCARDIOGRAM COMPLETE
Area-P 1/2: 3.29 cm2
Height: 63 in
S' Lateral: 2.4 cm
Weight: 3094.91 [oz_av]

## 2023-11-28 LAB — RENAL FUNCTION PANEL
Albumin: 2.7 g/dL — ABNORMAL LOW (ref 3.5–5.0)
Anion gap: 10 (ref 5–15)
BUN: 35 mg/dL — ABNORMAL HIGH (ref 8–23)
CO2: 25 mmol/L (ref 22–32)
Calcium: 8.1 mg/dL — ABNORMAL LOW (ref 8.9–10.3)
Chloride: 104 mmol/L (ref 98–111)
Creatinine, Ser: 1.28 mg/dL — ABNORMAL HIGH (ref 0.44–1.00)
GFR, Estimated: 44 mL/min — ABNORMAL LOW (ref >60)
Glucose, Bld: 207 mg/dL — ABNORMAL HIGH (ref 70–99)
Phosphorus: 2.4 mg/dL — ABNORMAL LOW (ref 2.5–4.6)
Potassium: 4.2 mmol/L (ref 3.5–5.1)
Sodium: 139 mmol/L (ref 135–145)

## 2023-11-28 LAB — TROPONIN I (HIGH SENSITIVITY)
Troponin I (High Sensitivity): 425 ng/L (ref <18)
Troponin I (High Sensitivity): 360 ng/L (ref <18)
Troponin I (High Sensitivity): 326 ng/L (ref <18)

## 2023-11-28 LAB — GLUCOSE, CAPILLARY
Glucose-Capillary: 202 mg/dL — ABNORMAL HIGH (ref 70–99)
Glucose-Capillary: 172 mg/dL — ABNORMAL HIGH (ref 70–99)
Glucose-Capillary: 206 mg/dL — ABNORMAL HIGH (ref 70–99)
Glucose-Capillary: 218 mg/dL — ABNORMAL HIGH (ref 70–99)
Glucose-Capillary: 223 mg/dL — ABNORMAL HIGH (ref 70–99)
Glucose-Capillary: 213 mg/dL — ABNORMAL HIGH (ref 70–99)

## 2023-11-28 LAB — BLOOD GAS, ARTERIAL
Acid-Base Excess: 3.6 mmol/L — ABNORMAL HIGH (ref 0.0–2.0)
Bicarbonate: 30.1 mmol/L — ABNORMAL HIGH (ref 20.0–28.0)
O2 Saturation: 98.4 %
Patient temperature: 37
RATE: 24 {breaths}/min
pCO2 arterial: 52 mmHg — ABNORMAL HIGH (ref 32–48)
pH, Arterial: 7.37 (ref 7.35–7.45)
pO2, Arterial: 77 mmHg — ABNORMAL LOW (ref 83–108)

## 2023-11-28 LAB — LACTIC ACID, PLASMA: Lactic Acid, Venous: 2.4 mmol/L (ref 0.5–1.9)

## 2023-11-28 LAB — CORTISOL: Cortisol, Plasma: 34.1 ug/dL

## 2023-11-28 MED ORDER — AMIODARONE IV BOLUS ONLY 150 MG/100ML
150.0000 mg | Freq: Once | INTRAVENOUS | Status: AC
Start: 2023-11-28 — End: 2023-11-28
  Administered 2023-11-28 (×2): 150 mg via INTRAVENOUS
  Filled 2023-11-28: qty 100

## 2023-11-28 MED ORDER — PROPOFOL 1000 MG/100ML IV EMUL
0.0000 ug/kg/min | INTRAVENOUS | Status: DC
  Administered 2023-11-28 (×4): 20 ug/kg/min via INTRAVENOUS
  Administered 2023-11-29: 30 ug/kg/min via INTRAVENOUS
  Administered 2023-11-29 (×2): 25 ug/kg/min via INTRAVENOUS
  Administered 2023-11-29: 30 ug/kg/min via INTRAVENOUS
  Administered 2023-11-29 – 2023-11-30 (×3): 25 ug/kg/min via INTRAVENOUS
  Administered 2023-11-30 (×2): 35 ug/kg/min via INTRAVENOUS
  Administered 2023-11-30: 25 ug/kg/min via INTRAVENOUS
  Administered 2023-11-30 (×2): 35 ug/kg/min via INTRAVENOUS
  Administered 2023-11-30: 25 ug/kg/min via INTRAVENOUS
  Administered 2023-11-30: 35 ug/kg/min via INTRAVENOUS
  Administered 2023-11-30: 25 ug/kg/min via INTRAVENOUS
  Administered 2023-11-30: 35 ug/kg/min via INTRAVENOUS
  Administered 2023-12-01: 25 ug/kg/min via INTRAVENOUS
  Administered 2023-12-01: 35 ug/kg/min via INTRAVENOUS
  Administered 2023-12-01: 25 ug/kg/min via INTRAVENOUS
  Administered 2023-12-01: 35 ug/kg/min via INTRAVENOUS
  Filled 2023-11-28 (×12): qty 100

## 2023-11-28 MED ORDER — THIAMINE HCL 100 MG PO TABS
100.0000 mg | ORAL_TABLET | Freq: Every day | ORAL | Status: DC
  Administered 2023-11-28 – 2023-11-30 (×6): 100 mg
  Filled 2023-11-28 (×5): qty 1

## 2023-11-28 MED ORDER — SODIUM CHLORIDE 0.9% FLUSH: 10.0000 mL | INTRAVENOUS | Status: DC | PRN

## 2023-11-28 MED ORDER — DIGOXIN 0.25 MG/ML IJ SOLN
0.2500 mg | Freq: Once | INTRAMUSCULAR | Status: AC
  Administered 2023-11-28 (×2): 0.25 mg via INTRAVENOUS
  Filled 2023-11-28: qty 2

## 2023-11-28 MED ORDER — POTASSIUM & SODIUM PHOSPHATES 280-160-250 MG PO PACK
1.0000 | PACK | Freq: Once | ORAL | Status: AC
  Administered 2023-11-28 (×2): 1
  Filled 2023-11-28: qty 1

## 2023-11-28 MED ORDER — PIVOT 1.5 CAL PO LIQD
1000.0000 mL | ORAL | Status: DC
  Administered 2023-11-28 – 2023-11-30 (×6): 1000 mL
  Filled 2023-11-28: qty 1000

## 2023-11-28 MED ORDER — ORAL CARE MOUTH RINSE: 15.0000 mL | OROMUCOSAL | Status: DC | PRN

## 2023-11-28 MED ORDER — JUVEN PO PACK
1.0000 | PACK | Freq: Two times a day (BID) | ORAL | Status: DC
  Administered 2023-11-29 – 2023-12-01 (×8): 1

## 2023-11-28 MED ORDER — INSULIN GLARGINE-YFGN 100 UNIT/ML ~~LOC~~ SOLN
12.0000 [IU] | Freq: Two times a day (BID) | SUBCUTANEOUS | Status: DC
  Administered 2023-11-28 – 2023-11-29 (×4): 12 [IU] via SUBCUTANEOUS
  Filled 2023-11-28 (×2): qty 0.12

## 2023-11-28 MED ORDER — AMIODARONE HCL IN DEXTROSE 360-4.14 MG/200ML-% IV SOLN
30.0000 mg/h | INTRAVENOUS | Status: DC
  Administered 2023-11-28 (×6): 60 mg/h via INTRAVENOUS
  Administered 2023-11-29: 30 mg/h via INTRAVENOUS
  Administered 2023-11-29 (×2): 60 mg/h via INTRAVENOUS
  Administered 2023-11-29 – 2023-12-01 (×9): 30 mg/h via INTRAVENOUS
  Filled 2023-11-28 (×9): qty 200

## 2023-11-28 MED ORDER — ACETAMINOPHEN 325 MG PO TABS
650.0000 mg | ORAL_TABLET | Freq: Four times a day (QID) | ORAL | Status: DC | PRN
  Administered 2023-11-28 (×2): 650 mg via ORAL
  Filled 2023-11-28: qty 2

## 2023-11-28 MED ORDER — ACETAMINOPHEN 325 MG PO TABS
650.0000 mg | ORAL_TABLET | Freq: Four times a day (QID) | ORAL | Status: DC | PRN
  Administered 2023-11-28 – 2023-11-30 (×6): 650 mg
  Filled 2023-11-28 (×3): qty 2

## 2023-11-28 MED ORDER — FREE WATER
30.0000 mL | Status: DC
  Administered 2023-11-28 – 2023-12-01 (×36): 30 mL

## 2023-11-28 MED ORDER — IPRATROPIUM-ALBUTEROL 0.5-2.5 (3) MG/3ML IN SOLN
3.0000 mL | Freq: Three times a day (TID) | RESPIRATORY_TRACT | Status: DC
  Administered 2023-11-29 – 2023-12-01 (×14): 3 mL via RESPIRATORY_TRACT
  Filled 2023-11-28 (×7): qty 3

## 2023-11-28 MED ORDER — SODIUM CHLORIDE 0.9% FLUSH
10.0000 mL | Freq: Two times a day (BID) | INTRAVENOUS | Status: DC
  Administered 2023-11-28 – 2023-12-01 (×16): 10 mL

## 2023-11-28 MED ORDER — AMIODARONE HCL IN DEXTROSE 360-4.14 MG/200ML-% IV SOLN
60.0000 mg/h | INTRAVENOUS | Status: DC
  Administered 2023-11-28 (×2): 60 mg/h via INTRAVENOUS
  Filled 2023-11-28: qty 200

## 2023-11-28 MED ORDER — ORAL CARE MOUTH RINSE
15.0000 mL | OROMUCOSAL | Status: DC
  Administered 2023-11-28 – 2023-12-01 (×88): 15 mL via OROMUCOSAL

## 2023-11-28 MED ORDER — FENTANYL CITRATE (PF) 50 MCG/ML IJ SOSY
50.0000 ug | PREFILLED_SYRINGE | INTRAMUSCULAR | Status: AC | PRN
  Administered 2023-11-28 – 2023-11-29 (×6): 50 ug via INTRAVENOUS
  Filled 2023-11-28 (×2): qty 1

## 2023-11-28 MED ORDER — AMIODARONE HCL IN DEXTROSE 360-4.14 MG/200ML-% IV SOLN: 30.0000 mg/h | INTRAVENOUS | Status: DC

## 2023-11-28 MED ORDER — ENOXAPARIN SODIUM 40 MG/0.4ML IJ SOSY
40.0000 mg | PREFILLED_SYRINGE | INTRAMUSCULAR | Status: DC
  Administered 2023-11-28 – 2023-11-30 (×6): 40 mg via SUBCUTANEOUS
  Filled 2023-11-28 (×3): qty 0.4

## 2023-11-28 MED ORDER — FENTANYL CITRATE (PF) 50 MCG/ML IJ SOSY
50.0000 ug | PREFILLED_SYRINGE | INTRAMUSCULAR | Status: DC | PRN
  Administered 2023-11-30 – 2023-12-01 (×8): 50 ug via INTRAVENOUS
  Filled 2023-11-28 (×5): qty 1

## 2023-11-28 MED ORDER — AMIODARONE LOAD VIA INFUSION
150.0000 mg | Freq: Once | INTRAVENOUS | Status: AC
  Administered 2023-11-28 (×2): 150 mg via INTRAVENOUS
  Filled 2023-11-28: qty 83.34

## 2023-11-28 MED ORDER — INSULIN GLARGINE-YFGN 100 UNIT/ML ~~LOC~~ SOLN
9.0000 [IU] | Freq: Every day | SUBCUTANEOUS | Status: DC
  Administered 2023-11-28 (×2): 9 [IU] via SUBCUTANEOUS
  Filled 2023-11-28: qty 0.09

## 2023-11-28 NOTE — Inpatient Diabetes Management (Signed)
 Inpatient Diabetes Program Recommendations  AACE/ADA: New Consensus Statement on Inpatient Glycemic Control  Target Ranges:  Prepandial:   less than 140 mg/dL      Peak postprandial:   less than 180 mg/dL (1-2 hours)      Critically ill patients:  140 - 180 mg/dL    Latest Reference Range & Units 11/27/23 09:58 11/27/23 15:07 11/27/23 19:30 11/27/23 23:34 11/28/23 03:39 11/28/23 07:40  Glucose-Capillary 70 - 99 mg/dL 724 (H) 758 (H) 780 (H) 204 (H) 202 (H) 172 (H)   Review of Glycemic Control  Diabetes history: DM2 Outpatient Diabetes medications: Metformin  XR 500 mg QAM Current orders for Inpatient glycemic control: Novolog  0-20 units Q4H  Inpatient Diabetes Program Recommendations:    Insulin : Please consider ordering Semglee 9 units Q24H (based on 88.3 kg x 0.1 units).  Thanks, Earnie Gainer, RN, MSN, CDCES Diabetes Coordinator Inpatient Diabetes Program [phone] (Team Pager from 8am to 5pm)

## 2023-11-28 NOTE — Progress Notes (Signed)
 In patient room RT at bedside suctioning patient, suddenly patients HR spikes up to 140's-150. Inge NP notified. Orders for amio bolus to be given. Will administer and continue to monitor.

## 2023-11-28 NOTE — Consult Note (Signed)
 NEUROLOGY CONSULT NOTE   Date of service: November 28, 2023 Patient Name: [name] MRN:  [id number] DOB:  11/06/1950 Chief Complaint: acute embolic stroke Requesting Provider: Isaiah Scrivener, MD  History of Present Illness  [name] is a 73 y.o. female with hx of COPD on 2 L supplemental oxygen , hypothyroidism and recent robotic assisted umbilical hernia repair on 11/25/23, who is admitted with acute metabolic encephalopathy and acute on chronic hypoxic and hypercapnic respiratory failure, sepsis due to suspected aspiration pneumonia, and acute kidney injury requiring intubation and mechanical ventilation. She was subsequently found to have extensive L MCA and R MCA ischemic infarcts. She is currently in a fib with RVR (new dx).  Sedation not paused for NIHSS and exam 2/2 new onset a fib with RVR. Exam on sedation:  NIHSS components Score: Comment  1a Level of Conscious 0[]  1[]  2[]  3[x]      1b LOC Questions 0[]  1[]  2[x]       1c LOC Commands 0[]  1[]  2[x]       2 Best Gaze 0[x]  1[]  2[]       3 Visual 0[x]  1[]  2[]  3[]      4 Facial Palsy 0[x]  1[]  2[]  3[]      5a Motor Arm - left 0[]  1[]  2[]  3[x]  4[]  UN[]    5b Motor Arm - Right 0[]  1[]  2[]  3[]  4[x]  UN[]    6a Motor Leg - Left 0[]  1[]  2[]  3[x]  4[]  UN[]    6b Motor Leg - Right 0[]  1[]  2[]  3[]  4[x]  UN[]    7 Limb Ataxia 0[x]  1[]  2[]  UN[]      8 Sensory 0[]  1[x]  2[]  UN[]      9 Best Language 0[]  1[]  2[]  3[x]      10 Dysarthria 0[]  1[]  2[]  UN[x]      11 Extinct. and Inattention 0[x]  1[]  2[]       TOTAL:  25      ROS   Unable to ascertain due to sedation and coma  Past History   Past Medical History:  Diagnosis Date   COPD (chronic obstructive pulmonary disease) (HCC)     Family History: No family history on file.  Social History  has no history on file for tobacco use, alcohol  use, and drug use.  Allergies  Allergen Reactions   Gabapentin  Other (See Comments)    Hallucinations    Trazodone  And Nefazodone Other (See  Comments)    Altered Mental Status   Ciprofloxacin Hives and Diarrhea   Mucinex  [Guaifenesin  Er] Nausea Only   Sulfa Antibiotics Rash    Medications   Current Facility-Administered Medications:    acetaminophen  (TYLENOL ) tablet 650 mg, 650 mg, Per Tube, Q6H PRN, Niels Kayla FALCON, RPH   amiodarone  (NEXTERONE  PREMIX) 360-4.14 MG/200ML-% (1.8 mg/mL) IV infusion, 60 mg/hr, Intravenous, Continuous, Vivienne Lonni Ingle, NP, Last Rate: 33.3 mL/hr at 11/28/23 1038, 60 mg/hr at 11/28/23 1038   azithromycin  (ZITHROMAX ) 500 mg in sodium chloride  0.9 % 250 mL IVPB, 500 mg, Intravenous, Q24H, Shellia Mann D, NP, Paused at 11/27/23 1536   Chlorhexidine  Gluconate Cloth 2 % PADS 6 each, 6 each, Topical, Daily, Shellia Mann D, NP, 6 each at 11/28/23 0924   docusate (COLACE) 50 MG/5ML liquid 100 mg, 100 mg, Per Tube, BID, Keene, Jeremiah D, NP, 100 mg at 11/28/23 9075   docusate sodium  (COLACE) capsule 100 mg, 100 mg, Oral, BID PRN, Keene, Jeremiah D, NP   enoxaparin  (LOVENOX ) injection 40 mg, 40 mg, Subcutaneous, Q24H, Kasa, Kurian, MD   fentaNYL  (SUBLIMAZE ) injection 50 mcg, 50  mcg, Intravenous, Q15 min PRN, Park, Leonor BROCKS, [place]   fentaNYL  (SUBLIMAZE ) injection 50-200 mcg, 50-200 mcg, Intravenous, Q30 min PRN, Park, Leonor BROCKS, [place]   insulin  aspart (novoLOG ) injection 0-20 Units, 0-20 Units, Subcutaneous, Q4H, Rust-Chester, Britton L, NP, 4 Units at 11/28/23 0758   insulin  glargine-yfgn (SEMGLEE ) injection 9 Units, 9 Units, Subcutaneous, Daily, Shellia Mann D, NP   ipratropium-albuterol  (DUONEB) 0.5-2.5 (3) MG/3ML nebulizer solution 3 mL, 3 mL, Nebulization, Q6H, Shellia Mann D, NP, 3 mL at 11/28/23 9271   norepinephrine  (LEVOPHED ) 16 mg in (0.064 mg/mL) premix infusion, 0-40 mcg/min, Intravenous, Titrated, Shellia Mann D, NP, Last Rate: 3.75 mL/hr at 11/28/23 0432, 4 mcg/min at 11/28/23 0432   Oral care mouth rinse, 15 mL, Mouth Rinse, Q2H, Kasa, Nickolas, MD, 15 mL at 11/28/23  9075   Oral care mouth rinse, 15 mL, Mouth Rinse, PRN, Kasa, Kurian, MD   pantoprazole  (PROTONIX ) injection 40 mg, 40 mg, Intravenous, QHS, Keene, Jeremiah D, NP, 40 mg at 12/24/2023 2331   piperacillin -tazobactam (ZOSYN ) IVPB 3.375 g, 3.375 g, Intravenous, Q8H, Park, Leonor BROCKS, [place], Last Rate: 12.5 mL/hr at 11/28/23 0746, 3.375 g at 11/28/23 0746   polyethylene glycol (MIRALAX  / GLYCOLAX ) packet 17 g, 17 g, Oral, Daily PRN, Shellia Mann BIRCH, NP   polyethylene glycol (MIRALAX  / GLYCOLAX ) packet 17 g, 17 g, Per Tube, Daily, Shellia Mann D, NP, 17 g at 11/28/23 9075   propofol  (DIPRIVAN ) 1000 MG/100ML infusion, 0-50 mcg/kg/min, Intravenous, Continuous, Park, Leonor BROCKS, [place]   sodium chloride  flush (NS) 0.9 % injection 10-40 mL, 10-40 mL, Intracatheter, Q12H, Rust-Chester, Britton L, NP, 10 mL at 11/28/23 0924   sodium chloride  flush (NS) 0.9 % injection 10-40 mL, 10-40 mL, Intracatheter, PRN, Rust-Chester, Jenita L, NP   vasopressin  (PITRESSIN) 20 Units in 100 mL (0.2 unit/mL) infusion-*FOR SHOCK*, 0-0.03 Units/min, Intravenous, Continuous, Shellia Mann D, NP, Last Rate: 9 mL/hr at 11/28/23 1034, 0.03 Units/min at 11/28/23 1034  Vitals   Vitals:   11/28/23 0945 11/28/23 1000 11/28/23 1015 11/28/23 1030  BP:      Pulse:      Resp: (!) 24 (!) 24 (!) 24 (!) 21  Temp: (!) 100.4 F (38 C) (!) 100.4 F (38 C) (!) 100.4 F (38 C) (!) 100.4 F (38 C)  TempSrc:      SpO2:      Weight:      Height:        Body mass index is 34.48 kg/m.   Physical Exam   Sedation not paused for exam 2/2 new onset a fib with RVR  Gen: patient lying in bed, intubated, sedated CV: irregular and tachycardic, extremities appear well-perfused Resp: ventilated  Neurologic exam MS: on sedation, does not follow commands Speech: intubated CN: PERRL, (-) corneals/oculocephalics/cough/gag on sedation Motor & sensory: withdraws to noxious stimuli on LUE and LLE, no response to noxious stimuli  R Coordination, gait: UTA  Labs/Imaging/Neurodiagnostic studies   CBC:  Recent Labs  Lab 2023/12/24 1013 11/28/23 0415  WBC 17.6* 12.5*  NEUTROABS 12.7*  --   HGB 12.3 10.7*  HCT 42.8 35.9*  MCV 100.0 95.7  PLT 333 213   Basic Metabolic Panel:  Lab Results  Component Value Date   NA 139 11/28/2023   K 4.2 11/28/2023   CO2 25 11/28/2023   GLUCOSE 207 (H) 11/28/2023   BUN 35 (H) 11/28/2023   CREATININE 1.28 (H) 11/28/2023   CALCIUM  8.1 (L) 11/28/2023   GFRNONAA 44 (L) 11/28/2023  Lipid Panel: No results found for: LDLCALC HgbA1c:  Lab Results  Component Value Date   HGBA1C 6.8 (H) 11/27/2023   Urine Drug Screen:     Component Value Date/Time   LABOPIA NONE DETECTED 11/27/2023 1118   COCAINSCRNUR NONE DETECTED 11/27/2023 1118   LABBENZ POSITIVE (A) 11/27/2023 1118   AMPHETMU NONE DETECTED 11/27/2023 1118   THCU NONE DETECTED 11/27/2023 1118   LABBARB NONE DETECTED 11/27/2023 1118    Alcohol  Level No results found for: ETH INR No results found for: INR APTT No results found for: APTT AED levels: No results found for: PHENYTOIN, ZONISAMIDE, LAMOTRIGINE, LEVETIRACETA  MRI Brain(Personally reviewed): 1. Large areas of multifocal acute/early subacute ischemia within the left MCA territory. 2. Multiple small areas of acute/early subacute ischemia in the right hemisphere, including the right occipital lobe and right parietal lobe.  TTE 1. Left ventricular ejection fraction, by estimation, is 60 to 65% . Left ventricular ejection fraction by PLAX is 69 % . The left ventricle has normal function. The left ventricle has no regional wall motion abnormalities. There is mild left ventricular hypertrophy. Left ventricular diastolic parameters are consistent with Grade I diastolic dysfunction ( impaired relaxation) . 2. Right ventricular systolic function is normal. The right ventricular size is normal. There is normal pulmonary artery systolic pressure. The  estimated right ventricular systolic pressure is 28. 8 mmHg. 3. The mitral valve is normal in structure. No evidence of mitral valve regurgitation. No evidence of mitral stenosis. 4. The aortic valve is normal in structure. Aortic valve regurgitation is not visualized. Aortic valve sclerosis is present, with no evidence of aortic valve stenosis. 5. The inferior vena cava is normal in size with greater than 50% respiratory variability, suggesting right atrial pressure of 3 mmHg.  ASSESSMENT   Kamarii Lynzi Meulemans is a 73 y.o. female with hx of COPD on 2 L supplemental oxygen , hypothyroidism and recent robotic assisted umbilical hernia repair on 11/25/23, who is admitted with acute metabolic encephalopathy and acute on chronic hypoxic and hypercapnic respiratory failure, sepsis due to suspected aspiration pneumonia, and acute kidney injury requiring intubation and mechanical ventilation. She was subsequently found to have extensive L MCA and R MCA ischemic infarcts. She is currently in a fib with RVR (new dx). Etiology of stroke is favored to be cardioembolic in the setting of a fib.  RECOMMENDATIONS   - Permissive HTN x48 hrs from sx onset or until stroke ruled out by MRI goal BP <220/110. PRN labetalol or hydralazine  if BP above these parameters. Avoid oral antihypertensives. - CTA H&N - ASA PR 300mg  daily. She will eventually require anticoagulation but the risk of hemorrhagic conversion is too high to start now - q4 hr neuro checks - STAT head CT for any change in neuro exam - Tele - PT/OT/SLP when able to participate - Stroke education - Amb referral to neurology upon discharge   Will continue to follow  ______________________________________________________________________    Signed, Elida CHRISTELLA Ross, MD [facility]

## 2023-11-28 NOTE — Progress Notes (Signed)
 Pt converted to NRS, Dr. Gollan notified.

## 2023-11-28 NOTE — Progress Notes (Addendum)
 Initial Nutrition Assessment  DOCUMENTATION CODES:   Obesity unspecified  INTERVENTION:   Pivot 1.5@50ml /hr- Initiate at 47ml/hr and increase by 10ml/hr q 8 hours until goal rate is reached  Free water flushes 30ml q4 hours to maintain tube patency   Regimen provides 1800kcal/day, 113g/day protein and 1037ml/day of free water.   Thiamine 100mg  daily via tube x 7 days   Juven Fruit Punch BID via tube, each serving provides 95kcal and 2.5g of protein (amino acids glutamine and arginine)  Pt at high refeed risk; recommend monitor potassium, magnesium  and phosphorus labs daily until stable  Daily weights   NUTRITION DIAGNOSIS:   Inadequate oral intake related to inability to eat (pt sedated and ventilated) as evidenced by NPO status.  GOAL:   Provide needs based on ASPEN/SCCM guidelines  MONITOR:   Vent status, Labs, Weight trends, TF tolerance, Skin, I & O's  REASON FOR ASSESSMENT:   Ventilator    ASSESSMENT:   73 y/o female with h/o HTN, DM, neuropathy, panic disorder, anxiety, depression, chronic pain, COPD, GERD, hypothyroidism, psoriasis and umbilical hernia s/p repair on 11/25/23 and who is now admitted with acute metabolic encephalopathy, acute on chronic hypoxic and hypercapnic respiratory failure, sepsis, suspected aspiration pneumonia, AKI and acute to subacute left MCA infarcts.  RD working remotely.  Pt sedated and ventilated. OGT in place. Will plan to initiate tube feeds today. Pt is at high refeed risk. Per chart, pt appears weight stable at baseline. RD will onbtain exam at follow up.   Medications reviewed and include: colace, lovenox , insulin , protonix , miralax , levophed, zosyn, vasopressin    Labs reviewed: K 4.2 wnl, BUN 35(H), creat 1.28(H), P 2.4(L) Wbc- 12.5(H), Hgb 10.7(L), Hct 35.9(L) Cbgs- 172, 202 x 24 hrs  AIC 6.8(H)- 10/22  Patient is currently intubated on ventilator support MV: 9.8 L/min Temp (24hrs), Avg:97.9 F (36.6 C), Min:94.7  F (34.8 C), Max:100.4 F (38 C)  MAP >1mmHg   UOP-   NUTRITION - FOCUSED PHYSICAL EXAM: Unable to perform at this time   Diet Order:   Diet Order             Diet NPO time specified  Diet effective now                  EDUCATION NEEDS:   No education needs have been identified at this time  Skin:  Skin Assessment: Reviewed RN Assessment (Stage I coccyx, incison abdomen)  Last BM:  10/23  Height:   Ht Readings from Last 1 Encounters:  11/27/23 5' 3 (1.6 m)    Weight:   Wt Readings from Last 1 Encounters:  11/28/23 88.3 kg    Ideal Body Weight:  52.2 kg  BMI:  Body mass index is 34.48 kg/m.  Estimated Nutritional Needs:   Kcal:  1700-1900kcal/day  Protein:  105-120g/day  Fluid:  1.4-1.6L/day  Augustin Shams MS, RD, LDN If unable to be reached, please send secure chat to RD inpatient available from 8:00a-4:00p daily

## 2023-11-28 NOTE — Progress Notes (Signed)
 NAME:  [name], MRN:  [id number], DOB:  1950-10-15, LOS: 1 ADMISSION DATE:  11/27/2023, CONSULTATION DATE:  11/27/2023 REFERRING MD:  Dr. Willo, CHIEF COMPLAINT:  Unresponsive, hypoxia    Brief Pt Description / Synopsis:  73 year old female with past medical history significant for COPD on 2 L supplemental oxygen , hypothyroidism and recent robotic assisted umbilical hernia repair on 11/25/23, who is admitted with acute metabolic encephalopathy and acute on chronic hypoxic and hypercapnic respiratory failure, sepsis due to suspected aspiration pneumonia, and acute kidney injury requiring intubation and mechanical ventilation.  Found to have acute to subacute large left MCA infarcts, along with multiple small areas of acute/subacute infarcts in the right occipital lobe and right parietal lobes.   History of Present Illness:  [name] is a 74 year old female with a past medical history significant for COPD, chronic hypoxic respiratory failure requiring 2 L supplemental oxygen , hypothyroidism, hypertension, and anxiety who presents to [facility] ED on 11/27/2023 for evaluation of altered mental status.  Patient is currently altered and on the ventilator and unable to contribute to history, and no family currently available, therefore history is obtained from chart review.  It is reported that she was found down and minimally responsive by family earlier this morning.  She underwent robotic assisted umbilical hernia repair surgery on 11/25/2023 by Dr Marinda and was discharged home yesterday 10/21.  Her last known well time was approximately 8:00 PM last night night prior to bedtime, where her son talked to her over the telephone and said she sounded fine.  On his way home from work this morning, he attempted to call her of which she did not answer.  He went by her house to check on her and she did not come to the door.   Upon EMS arrival she was noted to be hypoxic with sats in the 50s on room air which  she was subsequent placed on nonrebreather with improvement of saturations to the 70s.  There was also concern for possible tongue swelling which she was given IM epinephrine  by EMS.  On arrival to the ED she remained unresponsive  and somnolent, with gurgling respirations and with significant thick white secretions in the posterior oropharynx.  She required emergent intubation by ED provider.  ED provider did not appreciate any significant tongue/lip, or airway edema to suggest angioedema or anaphylaxis.   ED Course: Initial Vital Signs: Temperature 94.7 F, pulse 93, respiratory rate 16, blood pressure 84/38, SpO2 100% via ventilator Significant Labs: BNP 258, high-sensitivity troponin 48, WBC 17.6, lactic acid 6.8, TSH 1.8, bicarbonate 19, anion gap 23, BUN 29, creatinine 1.7, AST 127, ALT 58, glucose 256 VBG: pH 7.1/pCO2 81/pO2 74/bicarb 25.1 Urinalysis negative for UTI Urine drug screen positive for benzodiazepines Imaging Chest X-ray>>IMPRESSION: 1. Patchy airspace opacities in the left lung base, possibly atelectasis or aspiration. Trace left pleural effusion. 2. Well-positioned endotracheal tube. CT Head>>IMPRESSION: 1. Acute to early subacute left MCA infarcts. No hemorrhage. CT Abdomen & Pelvis wo contrast>>IMPRESSION: Apparent abdominal wall wound to the left of midline with underlying subcutaneous air extending laterally in the left abdominal wall. Question recent surgical procedure. If no recent procedure, this would be concerning for possible infection. No drainable focal fluid collection. Colonic diverticulosis.  No active diverticulitis. Stable 4.5 cm left ovarian cyst. Aortic atherosclerosis. Trace bilateral pleural effusions with bilateral lower lobe airway thickening and lower lobe opacities which could reflect atelectasis or developing bronchopneumonia. Medications Administered: 2 L IV fluid boluses, peripheral Levophed started, IV vancomycin  and Zosyn  PCCM asked to  admit for further workup and treatment.  Please see Significant Hospital Events section below for full detailed hospital course.   Pertinent  Medical History   Past Medical History:  Diagnosis Date   COPD (chronic obstructive pulmonary disease) (HCC)     Micro Data:  10/22: Blood cultures x 2>>no growth to date  10/22: COVID/flu/RSV PCR>> negative 10/22: Respiratory viral panel>> negative 10/22: Tracheal aspirate>> 10/22: Strep pneumo urinary antigen>>negative  10/22: Legionella urinary antigen>> 10/22: MRSA PCR>> negative   Antimicrobials:   Anti-infectives (From admission, onward)    Start     Dose/Rate Route Frequency Ordered Stop   11/27/23 1600  piperacillin-tazobactam (ZOSYN) IVPB 3.375 g        3.375 g 12.5 mL/hr over 240 Minutes Intravenous Every 8 hours 11/27/23 1447     11/27/23 1315  azithromycin  (ZITHROMAX ) 500 mg in sodium chloride  0.9 % 250 mL IVPB        500 mg 250 mL/hr over 60 Minutes Intravenous Every 24 hours 11/27/23 1308 11/30/23 1314   11/27/23 1215  piperacillin-tazobactam (ZOSYN) IVPB 3.375 g        3.375 g 100 mL/hr over 30 Minutes Intravenous  Once 11/27/23 1212 11/27/23 1312   11/27/23 1215  vancomycin (VANCOCIN) IVPB 1000 mg/200 mL premix        1,000 mg 200 mL/hr over 60 Minutes Intravenous  Once 11/27/23 1212 11/27/23 2302   11/27/23 1200  Ampicillin-Sulbactam (UNASYN) 3 g in sodium chloride  0.9 % 100 mL IVPB  Status:  Discontinued        3 g 200 mL/hr over 30 Minutes Intravenous  Once 11/27/23 1155 11/27/23 1212       Significant Hospital Events: Including procedures, antibiotic start and stop dates in addition to other pertinent events   10/22: Presented to ED unresponsive and hypoxic requiring emergent intubation by ED provider.  Postintubation with hypotension requiring IV fluids and initiation of peripheral Levophed.  PCCM asked to admit.  CT Head concerning for acute to subacute Left MCA infarcts, Neurology consulted.  10/23: MRI  overnight showing large areas of multifocal acute/early subacute ischemia within the left MCA territory, along with multiple small areas of acute/early subacute ischemia in the right hemisphere including the right occipital and parietal lobes. Levophed weaned down with addition of vasopressin.  Remains on vent. Renal function improving, UOP 1L last 24 hrs (net +1.1 L).  Developed A.fib with RVR requiring Amiodarone  bolus and infusion, Cardiology consulted.   Interim History / Subjective:  As outlined above under Significant Hospital Events section  Objective   Blood pressure (!) 102/42, pulse 75, temperature 100 F (37.8 C), resp. rate (!) 24, height 5' 3 (1.6 m), weight 88.3 kg, SpO2 100%.    Vent Mode: PRVC FiO2 (%):  [50 %-80 %] 50 % Set Rate:  [15 bmp-24 bmp] 24 bmp Vt Set:  [400 mL] 400 mL PEEP:  [8 cmH20] 8 cmH20 Plateau Pressure:  [12 cmH20-23 cmH20] 19 cmH20   Intake/Output Summary (Last 24 hours) at 11/28/2023 0723 Last data filed at 11/28/2023 0600 Gross per 24 hour  Intake 2158.57 ml  Output 1030 ml  Net 1128.57 ml   Filed Weights   11/27/23 1000 11/27/23 1505 11/28/23 0500  Weight: 80 kg 87.7 kg 88.3 kg    Examination: General: Critically ill-appearing on chronically ill-appearing obese female, laying in bed, intubated and sedated, no acute distress HENT: Atraumatic, normocephalic, neck supple, difficult to assess JVD due to body  habitus, orally intubated Lungs: Coarse breath sounds throughout, even, nonlabored, synchronous with the vent Cardiovascular: Tachycardia, irregularly irregular rhythm (A.fib with RVR on telemetry),  no murmurs, rubs, gallops Abdomen: Limited abdominal exam due to altered mental status and intubation: Obese, nontender, no guarding or rebound tenderness, bowel sounds hypoactive, 3 small incisions to abdomen from recent hernia repair which are all dermabonded clean dry and intact Extremities: Normal bulk and tone, no deformities, no  edema Neuro: Sedated, currently not following commands or withdrawing from pain, nursing reports intermittent agitation with movement of left side (limited motion of right side), PERRLA GU: Foley catheter in place draining yellow urine   Resolved Hospital Problem list     Assessment & Plan:   #Shock: Septic +/- Cardiogenic  #Atrial Fibrillation with RVR #Mildly Elevated Troponin, suspect demand ischemia  PMHx: HTN -Continuous cardiac monitoring -Maintain MAP >65 -IV fluids -Vasopressors as needed to maintain MAP goal -Trend lactic acid until normalized -HS Troponin peaked at 425 -Echocardiogram pending -IV Amiodarone  bolus and infusion -Cardiology consulted, appreciate input ~ will hold off on anticoagulation for now given risk of hemorrhagic conversion of strokes ~ will defer to Neurology on timing for anticoagulation/antiplatelets   #Acute on Chronic Hypoxic & Hypercapnic Respiratory Failure due to  #Suspected Pneumonia: Aspiration vs CAP  #COPD without acute exacerbation  #Intubated for Airway Protection  PMHx: COPD on 2L supplemental O2 -Full vent support, implement lung protective strategies -Plateau pressures less than 30 cm H20 -Wean FiO2 & PEEP as tolerated to maintain O2 sats 88 to 92% -Follow intermittent Chest X-ray & ABG as needed -Spontaneous Breathing Trials when respiratory parameters met and mental status permits -Implement VAP Bundle -Bronchodilators -ABX as above  #Severe Sepsis (Met SIRS Criteria on presentation: Temp. 94.7, Pulse 93, WBC 17.6) due to ... #Concern for Pneumonia: Aspiration vs CAP PMHx: recent robotic assisted laparoscopic umbilical hernia repair on 11/25/23 -Monitor fever curve -Trend WBC's & Procalcitonin -Follow cultures as above -Continue empiric Azithromycin  & Zosyn pending cultures & sensitivities -CT Abdomen & Pelvis reviewed by Dr. Marinda ~ felt nothing acute on imaging to explain her decompensation   #AKI ~ IMPROVING  #Anion  Gap Metabolic Acidosis ~ RESOLVED  #Lactic Acidosis  -Monitor I&O's / urinary output -Follow BMP -Ensure adequate renal perfusion -Avoid nephrotoxic agents as able -Replace electrolytes as indicated ~ Pharmacy following for assistance with electrolyte replacement -IV Fluids  -Consider Renal US   #Diabetes Mellitus with Hyperglycemia -CBG's q4h; Target range of 140 to 180 -SSI -Follow ICU Hypo/Hyperglycemia protocol  #Acute Metabolic Encephalopathy #Acute to Subacute large Left MCA Infarcts  #CO2 Narcosis #Sedation needs in setting of mechanical ventilation PMHx: Depression, anxiety  MRI Brain 10/23: Large areas of multifocal acute/subacute ischemia within the left MCA and multiple small areas of acute/subacute ischemia in the right hemisphere including the right occipital lobe and right parietal lobe  -UDS + for benzodiazepines  -Treatment of metabolic derangements and Hypercapnia as outlined above  -Maintain a RASS goal of 0 to -1 -Fentanyl  and Propofol  prn to maintain RASS goal -Avoid sedating medications as able -Daily wake up assessment -TSH normal at 1.8 -Consult Neurology, appreciate input       Best Practice (right click and Reselect all SmartList Selections daily)   Diet/type: NPO, start tube feeds  DVT prophylaxis: SCD GI prophylaxis: PPI Lines: central line and arterial line, both still needed Foley:  yes, and still needed Code Status:  full code Last date of multidisciplinary goals of care discussion [10/23]  10/23: Updated  pt's sons at bedside on plan of care. See IPAL note for specifics.   Labs   CBC: Recent Labs  Lab 11/27/23 1013 11/28/23 0415  WBC 17.6* 12.5*  NEUTROABS 12.7*  --   HGB 12.3 10.7*  HCT 42.8 35.9*  MCV 100.0 95.7  PLT 333 213    Basic Metabolic Panel: Recent Labs  Lab 11/27/23 1130 11/28/23 0415  NA 142 139  K 3.8 4.2  CL 100 104  CO2 19* 25  GLUCOSE 256* 207*  BUN 29* 35*  CREATININE 1.70* 1.28*  CALCIUM  8.5*  8.1*  PHOS  --  2.4*   GFR: Estimated Creatinine Clearance: 40.7 mL/min (A) (by C-G formula based on SCr of 1.28 mg/dL (H)). Recent Labs  Lab 11/27/23 1013 11/27/23 1118 11/27/23 1246 11/27/23 1816 11/27/23 2028 11/28/23 0053 11/28/23 0415  PROCALCITON  --   --   --  0.74  --   --   --   WBC 17.6*  --   --   --   --   --  12.5*  LATICACIDVEN  --    < > 6.0* 3.1* 3.0* 2.4*  --    < > = values in this interval not displayed.    Liver Function Tests: Recent Labs  Lab 11/27/23 1130 11/28/23 0415  AST 127*  --   ALT 58*  --   ALKPHOS 71  --   BILITOT 0.8  --   PROT 6.8  --   ALBUMIN 3.2* 2.7*   No results for input(s): LIPASE, AMYLASE in the last 168 hours. No results for input(s): AMMONIA in the last 168 hours.  ABG    Component Value Date/Time   PHART 7.37 11/28/2023 0650   PCO2ART 52 (H) 11/28/2023 0650   PO2ART 77 (L) 11/28/2023 0650   HCO3 30.1 (H) 11/28/2023 0650   ACIDBASEDEF 1.4 11/27/2023 2023   O2SAT 98.4 11/28/2023 0650     Coagulation Profile: No results for input(s): INR, PROTIME in the last 168 hours.  Cardiac Enzymes: No results for input(s): CKTOTAL, CKMB, CKMBINDEX, TROPONINI in the last 168 hours.  HbA1C: Hgb A1c MFr Bld  Date/Time Value Ref Range Status  11/27/2023 08:27 PM 6.8 (H) 4.8 - 5.6 % Final    Comment:    (NOTE) Diagnosis of Diabetes The following HbA1c ranges recommended by the American Diabetes Association (ADA) may be used as an aid in the diagnosis of diabetes mellitus.  Hemoglobin             Suggested A1C NGSP%              Diagnosis  <5.7                   Non Diabetic  5.7-6.4                Pre-Diabetic  >6.4                   Diabetic  <7.0                   Glycemic control for                       adults with diabetes.      CBG: Recent Labs  Lab 11/27/23 0958 11/27/23 1507 11/27/23 1930 11/27/23 2334 11/28/23 0339  GLUCAP 275* 241* 219* 204* 202*    Review of Systems:    Unable to assess due to AMS/intubation/sedation/critical  illness   Past Medical History:  She,  has a past medical history of COPD (chronic obstructive pulmonary disease) (HCC).   Surgical History:     Social History:      Family History:  Her family history is not on file.   Allergies Allergies  Allergen Reactions   Gabapentin  Other (See Comments)    Hallucinations    Trazodone  And Nefazodone Other (See Comments)    Altered Mental Status   Ciprofloxacin Hives and Diarrhea   Mucinex  [Guaifenesin  Er] Nausea Only   Sulfa Antibiotics Rash     Home Medications  Prior to Admission medications   Not on File     Critical care time: 42 minutes     Inge Lecher, AGACNP-BC [facility] Prefer epic messenger for cross cover needs If after hours, please call E-link

## 2023-11-28 NOTE — Consult Note (Signed)
 Consultation Note Date: 11/28/2023   Patient Name: [name]  DOB: 07/29/1950  MRN: [id number]  Age / Sex: 73 y.o., female  PCP: Antonette Angeline ORN, NP Referring Physician: Isaiah Scrivener, MD  Reason for Consultation: Establishing goals of care  HPI/Patient Profile: [name] is a 73 year old female with a past medical history significant for COPD, chronic hypoxic respiratory failure requiring 2 L supplemental oxygen , hypothyroidism, hypertension, and anxiety who presents to [facility] ED on 11/27/2023 for evaluation of altered mental status.  Patient is currently altered and on the ventilator and unable to contribute to history, and no family currently available, therefore history is obtained from chart.   Clinical Assessment and Goals of Care: Notes, diagnostics and labs reviewed in detail.  In to see patient.  She is currently resting in bed at this time on ventilator support with son Elsie at bedside and the female family member.  Son states patient was widowed on this day (10/23) 14 years ago.  Son states patient has 2 sons.  Son discusses that patient lives alone.  He states she uses a walker at baseline.  She performs her housework, cooks and cleans, and family members pick her up to take her shopping at places like [employer].  He advises that in stores, she walks with a cart and takes breaks as needed.    He discusses that for the past 3 years she has had increasing health issues.  He discusses back surgery a year ago.  He discusses her hernia repair on 10/20, and overnight hospitalization.  He discusses her discharge, going home, and then the next morning, having maintenance make entry into her apartment, and finding her in bed.  We discussed her diagnoses, prognosis, GOC, EOL wishes disposition and options.  Created space and opportunity for patient  to explore thoughts and feelings regarding current  medical information.  Active listening provided.  A detailed discussion was had today regarding advanced directives.   The difference between an aggressive medical intervention path and a comfort care path was discussed.  Values and goals of care important to patient and family were attempted to be elicited.  Discussed limitations of medical interventions to prolong quality of life in some situations and discussed the concept of human mortality.  Son discusses that they are a family of faith, and discusses that there is an appointed time to leave this earth.  Son accurately discusses the information that they have been provided in great detail.  He discusses different scenarios and concerns for quality of life on the other side of this hospitalization.  He discusses other family members and hospitalizations prior to their deaths.  Son states that the family has decided to give it a few days and then to try to wean from the ventilator and make decisions from there.  He states they have discussed that she would not want to live on ventilator support, and are discussing boundaries on acceptable quality of life.  He states he and his brother are patient's surrogate decision makers.  SUMMARY OF RECOMMENDATIONS   PMT will follow       Primary Diagnoses: Present on Admission:  Unresponsive   I have reviewed the medical record, interviewed the patient and family, and examined the patient. The following aspects are pertinent.  Past Medical History:  Diagnosis Date   COPD (chronic obstructive pulmonary disease) (HCC)    Social History   Socioeconomic History   Marital status: Widowed    Spouse name: Not on file   Number of children: Not on file   Years of education: Not on file   Highest education level: Not on file  Occupational History   Not on file  Tobacco Use   Smoking status: Not on file   Smokeless tobacco: Not on file  Substance and Sexual Activity   Alcohol  use: Not on  file   Drug use: Not on file   Sexual activity: Not on file  Other Topics Concern   Not on file  Social History Narrative   Not on file   Social Drivers of Health   Financial Resource Strain: Not on file  Food Insecurity: Patient Unable To Answer (11/28/2023)   Hunger Vital Sign    Worried About Running Out of Food in the Last Year: Patient unable to answer    Ran Out of Food in the Last Year: Patient unable to answer  Transportation Needs: Patient Unable To Answer (11/28/2023)   PRAPARE - Transportation    Lack of Transportation (Medical): Patient unable to answer    Lack of Transportation (Non-Medical): Patient unable to answer  Physical Activity: Not on file  Stress: Not on file  Social Connections: Patient Unable To Answer (11/28/2023)   Social Connection and Isolation Panel    Frequency of Communication with Friends and Family: Patient unable to answer    Frequency of Social Gatherings with Friends and Family: Patient unable to answer    Attends Religious Services: Patient unable to answer    Active Member of Clubs or Organizations: Patient unable to answer    Attends Banker Meetings: Patient unable to answer    Marital Status: Patient unable to answer   No family history on file. Scheduled Meds:  Chlorhexidine  Gluconate Cloth  6 each Topical Daily   docusate  100 mg Per Tube BID   enoxaparin  (LOVENOX ) injection  40 mg Subcutaneous Q24H   free water  30 mL Per Tube Q4H   insulin  aspart  0-20 Units Subcutaneous Q4H   insulin  glargine-yfgn  9 Units Subcutaneous Daily   ipratropium-albuterol   3 mL Nebulization Q6H   [START ON 11/29/2023] nutrition supplement (JUVEN)  1 packet Per Tube BID BM   mouth rinse  15 mL Mouth Rinse Q2H   pantoprazole  (PROTONIX ) IV  40 mg Intravenous QHS   polyethylene glycol  17 g Per Tube Daily   sodium chloride  flush  10-40 mL Intracatheter Q12H   thiamine  100 mg Per Tube Daily   Continuous Infusions:  amiodarone  60 mg/hr  (11/28/23 1400)   azithromycin  250 mL/hr at 11/28/23 1400   feeding supplement (PIVOT 1.5 CAL) 20 mL/hr at 11/28/23 1400   norepinephrine (LEVOPHED) Adult infusion 9 mcg/min (11/28/23 1400)   piperacillin-tazobactam (ZOSYN)  IV 3.375 g (11/28/23 1459)   propofol  (DIPRIVAN ) infusion 20 mcg/kg/min (11/28/23 1400)   vasopressin 0.03 Units/min (11/28/23 1400)   PRN Meds:.acetaminophen , docusate sodium , fentaNYL  (SUBLIMAZE ) injection, fentaNYL  (SUBLIMAZE ) injection, mouth rinse, polyethylene glycol, sodium chloride  flush Medications Prior to Admission:  Prior to Admission medications  Medication Sig Start Date End Date Taking? Authorizing Provider  citalopram  (CELEXA ) 20 MG tablet Take 20 mg by mouth daily.   Yes [provider]  clonazePAM  (KLONOPIN ) 0.5 MG tablet Take 0.5 mg by mouth 3 (three) times daily.   Yes [provider]  fenofibrate  (TRICOR ) 145 MG tablet Take 145 mg by mouth daily.   Yes [provider]  levothyroxine  (SYNTHROID ) 112 MCG tablet Take 112 mcg by mouth daily before breakfast.   Yes [provider]  lisinopril  (ZESTRIL ) 10 MG tablet Take 10 mg by mouth daily.   Yes [provider]  metFORMIN  (GLUCOPHAGE -XR) 500 MG 24 hr tablet Take 500 mg by mouth daily with breakfast.   Yes [provider]  nystatin  (MYCOSTATIN ) 100000 UNIT/ML suspension Take 5 mLs by mouth 4 (four) times daily.   Yes [provider]  oxyCODONE  (OXY IR/ROXICODONE ) 5 MG immediate release tablet Take 5 mg by mouth every 4 (four) hours as needed for severe pain (pain score 7-10).   Yes [provider]   Allergies  Allergen Reactions   Gabapentin  Other (See Comments)    Hallucinations    Trazodone  And Nefazodone Other (See Comments)    Altered Mental Status   Ciprofloxacin Hives and Diarrhea   Mucinex  [Guaifenesin  Er] Nausea Only   Sulfa Antibiotics Rash   Review of Systems  Unable to perform ROS   Physical  Exam Constitutional:      Comments: Eyes closed  Pulmonary:     Comments: On ventilator support    Vital Signs: BP 118/70   Pulse 70   Temp 100.2 F (37.9 C)   Resp (!) 24   Ht 5' 3 (1.6 m)   Wt 88.3 kg   LMP  (LMP Unknown)   SpO2 100%   BMI 34.48 kg/m  Pain Scale: CPOT       SpO2: SpO2: 100 % O2 Device:SpO2: 100 % O2 Flow Rate: .   IO: Intake/output summary:  Intake/Output Summary (Last 24 hours) at 11/28/2023 1537 Last data filed at 11/28/2023 1400 Gross per 24 hour  Intake 2311.38 ml  Output 830 ml  Net 1481.38 ml    LBM: Last BM Date : 11/28/23 Baseline Weight: Weight: 80 kg Most recent weight: Weight: 88.3 kg       Signed by: Camelia Lewis, NP   Please contact Palliative Medicine Team phone at [phone] for questions and concerns.  For individual provider: See Tracey

## 2023-11-28 NOTE — IPAL (Signed)
  Interdisciplinary Goals of Care Family Meeting   Date carried out: 11/28/2023  Location of the meeting: Conference room  Member's involved: Physician, Nurse Practitioner, and Family Member or next of kin, Dr Matthews of Neurology   Durable Power of Attorney or acting medical decision maker: pt's 2 sons, daughter in Social worker, pt's sister and cousin, granddaughter      Discussion: We discussed goals of care for [name] [name], sepsis due to suspected aspiration pneumonia, and acute kidney injury requiring intubation and mechanical ventilation.  Found to have large acute to subacute left MCA infarcts   Followed up on goals of care discussions from yesterday with updates including strokes present on MRI overnight.  Dr. Matthews present during meeting discussing anticipated Neurological deficits including right sided weakness, and difficulty with speech and possible comprehension.  For now, we will continue to treat the treatable (sepsis and pneumonia/Respiratory Failure/shock/A.fib with RVR/AKI/etc.) and work towards extubation.  Once we are at a point for extubation, we won't know whether she can protect her airway until the tube is removed.  When are at that point, we will have further goals of care conversations.  [name].   Code status:   Code Status: Full Code   Disposition: Continue current acute care  Time spent for the meeting: 25 minutes   [name], AGACNP-BC [facility] Prefer epic messenger for cross cover needs If after hours, please call E-link  Jill JONETTA Lecher, NP  11/28/2023, 12:02 PM

## 2023-11-28 NOTE — Plan of Care (Signed)
   Problem: Education: Goal: Knowledge of General Education information will improve Description Including pain rating scale, medication(s)/side effects and non-pharmacologic comfort measures Outcome: Progressing   Problem: Health Behavior/Discharge Planning: Goal: Ability to manage health-related needs will improve Outcome: Progressing   Problem: Clinical Measurements: Goal: Ability to maintain clinical measurements within normal limits will improve Outcome: Progressing Goal: Respiratory complications will improve Outcome: Progressing   Problem: Nutrition: Goal: Adequate nutrition will be maintained Outcome: Progressing

## 2023-11-28 NOTE — Consult Note (Signed)
 Cardiology Consultation   Patient ID: [name] MRN: [id number]; DOB: 11/26/1950  Admit date: 11/27/2023 Date of Consult: 11/28/2023  PCP:  Antonette Angeline ORN, NP   Physician requesting consult Dr. Isaiah Reason for consult: Atrial fibrillation with RVR  Patient Profile: [name] is a 73 y.o. female with a hx of chronic hypoxic respiratory failure on 2 L, COPD, hypertension, anxiety, hypothyroidism presenting for altered mental status, cardiology consulted for atrial fibrillation with RVR  History of Present Illness: Notes indicating she underwent robotic assisted umbilical hernia repair surgery November 25, 2023 by Dr. Marinda and was discharged home October 21 When she did not respond to family phone calls, they went to her house to check on her and she did not come to the door  Ms. Cuoco was found unresponsive by family at apartment for unknown downtime, concerns for tongue swelling, gurgling respirations, hypoxic with saturations 50% on room air, given epi in the field, remained unresponsive in the emergency room on arrival Saturations up to the 70% range on nonrebreather Hypotensive 80/40 on arrival Intubated in the ER for airway protection, thick mucousy secretions suctioned Admitted for acute on chronic hypoxic respiratory failure Code sepsis called, started on broad-spectrum antibiotics, Bearr hugger applied  CT head with acute to subacute left MCA infarcts Head MRI large areas multifocal acute/early subacute ischemia within the left MCA territory along with multiple small areas acute/early subacute ischemia in the right hemisphere including right occipital and parietal lobes  -Levophed vasopressin weaned down Morning of November 28, 2023 developed atrial fibrillation with RVR Was started on amiodarone  bolus with infusion by ICU team, other treatment options limited secondary to hypotension  Past Medical History:  Diagnosis Date   COPD (chronic obstructive  pulmonary disease) (HCC)       Home Medications:  Prior to Admission medications   Medication Sig Start Date End Date Taking? Authorizing Provider  citalopram  (CELEXA ) 20 MG tablet Take 20 mg by mouth daily.   Yes [provider]  clonazePAM  (KLONOPIN ) 0.5 MG tablet Take 0.5 mg by mouth 3 (three) times daily.   Yes [provider]  fenofibrate  (TRICOR ) 145 MG tablet Take 145 mg by mouth daily.   Yes [provider]  levothyroxine  (SYNTHROID ) 112 MCG tablet Take 112 mcg by mouth daily before breakfast.   Yes [provider]  lisinopril  (ZESTRIL ) 10 MG tablet Take 10 mg by mouth daily.   Yes [provider]  metFORMIN  (GLUCOPHAGE -XR) 500 MG 24 hr tablet Take 500 mg by mouth daily with breakfast.   Yes [provider]  nystatin  (MYCOSTATIN ) 100000 UNIT/ML suspension Take 5 mLs by mouth 4 (four) times daily.   Yes [provider]  oxyCODONE  (OXY IR/ROXICODONE ) 5 MG immediate release tablet Take 5 mg by mouth every 4 (four) hours as needed for severe pain (pain score 7-10).   Yes [provider]    Scheduled Meds:  Chlorhexidine  Gluconate Cloth  6 each Topical Daily   docusate  100 mg Per Tube BID   enoxaparin  (LOVENOX ) injection  40 mg Subcutaneous Q24H   free water  30 mL Per Tube Q4H   insulin  aspart  0-20 Units Subcutaneous Q4H   insulin  glargine-yfgn  9 Units Subcutaneous Daily   ipratropium-albuterol   3 mL Nebulization Q6H   [START ON 11/29/2023] nutrition supplement (JUVEN)  1 packet Per Tube BID BM   mouth rinse  15 mL Mouth Rinse Q2H   pantoprazole  (PROTONIX ) IV  40 mg Intravenous QHS  polyethylene glycol  17 g Per Tube Daily   sodium chloride  flush  10-40 mL Intracatheter Q12H   thiamine  100 mg Per Tube Daily   Continuous Infusions:  amiodarone  60 mg/hr (11/28/23 1500)   azithromycin  Stopped (11/28/23 1414)   feeding supplement (PIVOT 1.5 CAL) 20 mL/hr at 11/28/23 1500   norepinephrine (LEVOPHED) Adult  infusion 9 mcg/min (11/28/23 1500)   piperacillin-tazobactam (ZOSYN)  IV 12.5 mL/hr at 11/28/23 1500   propofol  (DIPRIVAN ) infusion 20 mcg/kg/min (11/28/23 1500)   vasopressin 0.03 Units/min (11/28/23 1500)   PRN Meds: acetaminophen , docusate sodium , fentaNYL  (SUBLIMAZE ) injection, fentaNYL  (SUBLIMAZE ) injection, mouth rinse, polyethylene glycol, sodium chloride  flush  Allergies:    Allergies  Allergen Reactions   Gabapentin  Other (See Comments)    Hallucinations    Trazodone  And Nefazodone Other (See Comments)    Altered Mental Status   Ciprofloxacin Hives and Diarrhea   Mucinex  [Guaifenesin  Er] Nausea Only   Sulfa Antibiotics Rash    Social History:   Social History   Socioeconomic History   Marital status: Widowed    Spouse name: Not on file   Number of children: Not on file   Years of education: Not on file   Highest education level: Not on file  Occupational History   Not on file  Tobacco Use   Smoking status: Not on file   Smokeless tobacco: Not on file  Substance and Sexual Activity   Alcohol  use: Not on file   Drug use: Not on file   Sexual activity: Not on file  Other Topics Concern   Not on file  Social History Narrative   Not on file   Social Drivers of Health   Financial Resource Strain: Not on file  Food Insecurity: Patient Unable To Answer (11/28/2023)   Hunger Vital Sign    Worried About Running Out of Food in the Last Year: Patient unable to answer    Ran Out of Food in the Last Year: Patient unable to answer  Transportation Needs: Patient Unable To Answer (11/28/2023)   PRAPARE - Transportation    Lack of Transportation (Medical): Patient unable to answer    Lack of Transportation (Non-Medical): Patient unable to answer  Physical Activity: Not on file  Stress: Not on file  Social Connections: Patient Unable To Answer (11/28/2023)   Social Connection and Isolation Panel    Frequency of Communication with Friends and Family: Patient unable to  answer    Frequency of Social Gatherings with Friends and Family: Patient unable to answer    Attends Religious Services: Patient unable to answer    Active Member of Clubs or Organizations: Patient unable to answer    Attends Banker Meetings: Patient unable to answer    Marital Status: Patient unable to answer  Intimate Partner Violence: Unknown (11/28/2023)   Humiliation, Afraid, Rape, and Kick questionnaire    Fear of Current or Ex-Partner: Not on file    Emotionally Abused: Patient unable to answer    Physically Abused: Patient unable to answer    Sexually Abused: Patient unable to answer    Family History:   No family history on file.   ROS:  Please see the history of present illness.  Review of Systems  Unable to perform ROS: Intubated     Physical Exam/Data: Vitals:   11/28/23 1330 11/28/23 1345 11/28/23 1400 11/28/23 1415  BP:      Pulse: 92 78 73 70  Resp: (!) 22 (!) 24 (!) 24 ROLLEN)  24  Temp: 99.9 F (37.7 C) 100 F (37.8 C) 100 F (37.8 C) 100.2 F (37.9 C)  TempSrc:      SpO2: 96% 100% 100% 100%  Weight:      Height:        Intake/Output Summary (Last 24 hours) at 11/28/2023 1609 Last data filed at 11/28/2023 1554 Gross per 24 hour  Intake 2245.34 ml  Output 1180 ml  Net 1065.34 ml      11/28/2023    5:00 AM 11/27/2023    3:05 PM 11/27/2023   10:00 AM  Last 3 Weights  Weight (lbs) 194 lb 10.7 oz 193 lb 6.9 oz 176 lb 5.9 oz  Weight (kg) 88.3 kg 87.74 kg 80 kg     Body mass index is 34.48 kg/m.  General:  Well nourished, well developed, in no acute distress HEENT: normal Neck: no JVD Vascular: No carotid bruits; Distal pulses 2+ bilaterally Cardiac: Irregularly irregular no murmur  Lungs:  clear to auscultation bilaterally, no wheezing, rhonchi or rales  Abd: soft, nontender, no hepatomegaly  Ext: no edema Musculoskeletal:  No deformities, full exam not performed given sedation Skin: warm and dry  Neuro: Unable to test Psych:  Sedated  EKG:  The EKG was personally reviewed and demonstrates: Normal sinus rhythm with rate 94 bpm T wave abnormality inferior leads Telemetry:  Telemetry was personally reviewed and demonstrates: Atrial fibrillation with rate 140 up to 150 bpm  Relevant CV Studies: Echocardiogram with ejection fraction 60%, normal RV size and function, normal pressures, no significant valvular heart disease  Laboratory Data: High Sensitivity Troponin:   Recent Labs  Lab 11/27/23 1816 11/27/23 2027 11/28/23 0053 11/28/23 0415 11/28/23 0804  TROPONINIHS 332* 379* 425* 360* 326*     Chemistry Recent Labs  Lab 11/27/23 1130 11/28/23 0415  NA 142 139  K 3.8 4.2  CL 100 104  CO2 19* 25  GLUCOSE 256* 207*  BUN 29* 35*  CREATININE 1.70* 1.28*  CALCIUM  8.5* 8.1*  GFRNONAA 31* 44*  ANIONGAP 23* 10    Recent Labs  Lab 11/27/23 1130 11/28/23 0415  PROT 6.8  --   ALBUMIN 3.2* 2.7*  AST 127*  --   ALT 58*  --   ALKPHOS 71  --   BILITOT 0.8  --    Lipids No results for input(s): CHOL, TRIG, HDL, LABVLDL, LDLCALC, CHOLHDL in the last 168 hours.  Hematology Recent Labs  Lab 11/27/23 1013 11/28/23 0415  WBC 17.6* 12.5*  RBC 4.28 3.75*  HGB 12.3 10.7*  HCT 42.8 35.9*  MCV 100.0 95.7  MCH 28.7 28.5  MCHC 28.7* 29.8*  RDW 14.4 14.5  PLT 333 213   Thyroid   Recent Labs  Lab 11/27/23 1130  TSH 1.852  FREET4 1.20*    BNP Recent Labs  Lab 11/27/23 1013  BNP 258.4*    DDimer No results for input(s): DDIMER in the last 168 hours.  Radiology/Studies:  ECHOCARDIOGRAM COMPLETE Result Date: 11/28/2023    ECHOCARDIOGRAM REPORT   Patient Name:   SIDDHI DORNBUSH Siemen Date of Exam: 11/27/2023 Medical Rec #:  [id number]        Height:       63.0 in Accession #:    [id number]       Weight:       193.4 lb Date of Birth:  18-Aug-1950        BSA:          1.906 m Patient Age:  73 years         BP:           98/38 mmHg Patient Gender: F                HR:           85 bpm. Exam  Location:  [facility] Procedure: 2D Echo, Cardiac Doppler and Color Doppler (Both Spectral and Color            Flow Doppler were utilized during procedure). Indications:    R57.9 Shock  History:        Patient has no prior history of Echocardiogram examinations.                 COPD.  Sonographer:    Carl Coma RDCS Referring Phys: [id number] INGE JONETTA LECHER Diagnosing      Daisa Stennis MD Phys: IMPRESSIONS  1. Left ventricular ejection fraction, by estimation, is 60 to 65%. Left ventricular ejection fraction by PLAX is 69 %. The left ventricle has normal function. The left ventricle has no regional wall motion abnormalities. There is mild left ventricular hypertrophy. Left ventricular diastolic parameters are consistent with Grade I diastolic dysfunction (impaired relaxation).  2. Right ventricular systolic function is normal. The right ventricular size is normal. There is normal pulmonary artery systolic pressure. The estimated right ventricular systolic pressure is 28.8 mmHg.  3. The mitral valve is normal in structure. No evidence of mitral valve regurgitation. No evidence of mitral stenosis.  4. The aortic valve is normal in structure. Aortic valve regurgitation is not visualized. Aortic valve sclerosis is present, with no evidence of aortic valve stenosis.  5. The inferior vena cava is normal in size with greater than 50% respiratory variability, suggesting right atrial pressure of 3 mmHg. FINDINGS  Left Ventricle: Left ventricular ejection fraction, by estimation, is 60 to 65%. Left ventricular ejection fraction by PLAX is 69 %. The left ventricle has normal function. The left ventricle has no regional wall motion abnormalities. Strain was performed and the global longitudinal strain is indeterminate. The left ventricular internal cavity size was normal in size. There is mild left ventricular hypertrophy. Left ventricular diastolic parameters are consistent with Grade I diastolic dysfunction (impaired  relaxation). Right Ventricle: The right ventricular size is normal. No increase in right ventricular wall thickness. Right ventricular systolic function is normal. There is normal pulmonary artery systolic pressure. The tricuspid regurgitant velocity is 2.44 m/s, and  with an assumed right atrial pressure of 5 mmHg, the estimated right ventricular systolic pressure is 28.8 mmHg. Left Atrium: Left atrial size was normal in size. Right Atrium: Right atrial size was normal in size. Pericardium: There is no evidence of pericardial effusion. Mitral Valve: The mitral valve is normal in structure. No evidence of mitral valve regurgitation. No evidence of mitral valve stenosis. Tricuspid Valve: The tricuspid valve is normal in structure. Tricuspid valve regurgitation is mild . No evidence of tricuspid stenosis. Aortic Valve: The aortic valve is normal in structure. Aortic valve regurgitation is not visualized. Aortic valve sclerosis is present, with no evidence of aortic valve stenosis. Pulmonic Valve: The pulmonic valve was normal in structure. Pulmonic valve regurgitation is not visualized. No evidence of pulmonic stenosis. Aorta: The aortic root is normal in size and structure. Venous: The inferior vena cava is normal in size with greater than 50% respiratory variability, suggesting right atrial pressure of 3 mmHg. IAS/Shunts: No atrial level shunt detected by color flow Doppler. Additional Comments: 3D  was performed not requiring image post processing on an independent workstation and was indeterminate.  LEFT VENTRICLE PLAX 2D LV EF:         Left            Diastology                ventricular     LV e' medial:    5.33 cm/s                ejection        LV E/e' medial:  11.7                fraction by     LV e' lateral:   5.95 cm/s                PLAX is 69      LV E/e' lateral: 10.5                %. LVIDd:         3.90 cm LVIDs:         2.40 cm LV PW:         0.90 cm LV IVS:        0.90 cm LVOT diam:     1.80 cm LV  SV:         50 LV SV Index:   26 LVOT Area:     2.54 cm  RIGHT VENTRICLE            IVC RV Basal diam:  3.80 cm    IVC diam: 2.20 cm RV S prime:     9.84 cm/s TAPSE (M-mode): 1.6 cm LEFT ATRIUM             Index        RIGHT ATRIUM          Index LA diam:        3.60 cm 1.89 cm/m   RA Area:     9.69 cm LA Vol (A2C):   30.6 ml 16.05 ml/m  RA Volume:   21.10 ml 11.07 ml/m LA Vol (A4C):   21.5 ml 11.28 ml/m LA Biplane Vol: 25.7 ml 13.48 ml/m  AORTIC VALVE LVOT Vmax:   115.50 cm/s LVOT Vmean:  76.050 cm/s LVOT VTI:    0.196 m  AORTA Ao Root diam: 3.50 cm Ao Asc diam:  3.00 cm MITRAL VALVE                TRICUSPID VALVE MV Area (PHT): 3.29 cm     TR Peak grad:   23.8 mmHg MV Decel Time: 231 msec     TR Vmax:        244.00 cm/s MV E velocity: 62.60 cm/s MV A velocity: 101.25 cm/s  SHUNTS MV E/A ratio:  0.62         Systemic VTI:  0.20 m                             Systemic Diam: 1.80 cm Evalene Lunger MD Electronically signed by Evalene Lunger MD Signature Date/Time: 11/28/2023/2:49:27 PM    Final    DG Chest Port 1 View Result Date: 11/28/2023 EXAM: 1 VIEW(S) XRAY OF THE CHEST 11/28/2023 05:19:00 AM COMPARISON: 11/27/2023 CLINICAL HISTORY: Acute respiratory failure with hypoxia and hypercarbia (HCC) 8762515. The reason for exam is stated as acute respiratory failure with hypoxia and hypercarbia. Hx of COPD. FINDINGS:  LINES, TUBES AND DEVICES: Endotracheal tube in place with tip 2.5 cm above the carina. Enteric tube in place, coursing below the diaphragm. Right IJ central venous catheter in place with tip overlying the SVC. LUNGS AND PLEURA: Small left pleural effusion. Persistent low lung volumes with bibasilar opacities, similar to prior. No pulmonary edema. No pneumothorax. HEART AND MEDIASTINUM: Aortic atherosclerosis. No acute abnormality of the cardiac and mediastinal silhouettes. BONES AND SOFT TISSUES: No acute osseous abnormality. IMPRESSION: 1. Persistent low lung volumes with unchanged bibasilar  opacities, similar to prior. Electronically signed by: Waddell Calk MD 11/28/2023 05:43 AM EDT RP Workstation: [id number]   DG Abd 1 View Result Date: 11/28/2023 EXAM: 1 VIEW XRAY OF THE ABDOMEN 11/28/2023 12:16:00 AM COMPARISON: None available. CLINICAL HISTORY: [id number] Encounter for imaging study to confirm orogastric (OG) tube placement [id number]. OG tube placement verification. Encounter for imaging study to confirm orogastric (OG) tube placement [id number]. OG tube placement verification. FINDINGS: BOWEL: Enteric tube tip and side port in the stomach. IMPRESSION: 1. Enteric tube tip and side port in the stomach. Electronically signed by: Norman Gatlin MD 11/28/2023 12:19 AM EDT RP Workstation: [id number]   MR BRAIN WO CONTRAST Result Date: 11/27/2023 EXAM: MRI BRAIN WITHOUT CONTRAST 11/27/2023 10:22:27 PM TECHNIQUE: Multiplanar multisequence MRI of the head/brain was performed without the administration of intravenous contrast. COMPARISON: None available. CLINICAL HISTORY: Neuro deficit, acute, stroke suspected; multiple acute infarcts on head CT. Presents to the ED for altered mental status. Patient was reportedly found down in bed this morning by family, minimally responsive at that time. EMS was contacted and noted patient to be hypoxic to the 50s on room air, placed on nonrebreather with partial improvement into the 70s. There was concern that she had tongue swelling and possible allergic reaction, patient given IM epinephrine  by EMS. On arrival to the ED, patient is unresponsive. FINDINGS: BRAIN AND VENTRICLES: Large areas of multifocal acute/early subacute ischemia within the left MCA territory. The largest area is in the anterior left frontal lobe. Other affected areas include the posterior parietal lobe and the temporal lobe. Multiple small areas of acute/early subacute ischemia in the right hemisphere including the right occipital lobe and right parietal lobe. No intracranial hemorrhage. No mass. No  midline shift. No hydrocephalus. The sella is unremarkable. Normal flow voids. ORBITS: No acute abnormality. SINUSES AND MASTOIDS: No acute abnormality. BONES AND SOFT TISSUES: Normal marrow signal. No acute soft tissue abnormality. IMPRESSION: 1. Large areas of multifocal acute/early subacute ischemia within the left MCA territory. 2. Multiple small areas of acute/early subacute ischemia in the right hemisphere, including the right occipital lobe and right parietal lobe. Electronically signed by: Franky Stanford MD 11/27/2023 10:39 PM EDT RP Workstation: [id number]   DG Chest Port 1 View Result Date: 11/27/2023 EXAM: 1 VIEW(S) XRAY OF THE CHEST 11/27/2023 05:17:44 PM COMPARISON: 11/27/2023 CLINICAL HISTORY: Encounter for central line placement FINDINGS: LINES, TUBES AND DEVICES: ETT in place with tip 2.0 cm above the carina. Right internal jugular central venous catheter with distal tip at mid superior vena cava. Left chest cardiac defibrillator pads noted. LUNGS AND PLEURA: Left basilar consolidation. New consolidation in the right mid lung. Trace left pleural effusion. No pulmonary edema. No pneumothorax. HEART AND MEDIASTINUM: Atherosclerotic calcifications. No acute abnormality of the cardiac and mediastinal silhouettes. BONES AND SOFT TISSUES: No acute osseous abnormality. IMPRESSION: 1. New consolidation in the right mid lung. Differential is atelectasis, aspiration, or pneumonia. 2. Similar small left pleural effusion and left basilar atelectasis or pneumonia. 3.  ETT in place with tip 2.0 cm above the carina. 4. Right internal jugular central venous catheter with distal tip at mid superior vena cava. Electronically signed by: Norman Gatlin MD 11/27/2023 05:24 PM EDT RP Workstation: [id number]   CT Head Wo Contrast Result Date: 11/27/2023 EXAM: CT HEAD WITHOUT CONTRAST 11/27/2023 01:11:46 PM TECHNIQUE: CT of the head was performed without the administration of intravenous contrast. Automated exposure  control, iterative reconstruction, and/or weight based adjustment of the mA/kV was utilized to reduce the radiation dose to as low as reasonably achievable. COMPARISON: Head CT 05/17/2023 CLINICAL HISTORY: Mental status change, unknown cause. FINDINGS: BRAIN AND VENTRICLES: There are new moderate sized regions of cytotoxic edema involving the left frontal lobe centered at the level of the operculum, lateral left parietal lobe, and lateral left occipital lobe compatible with acute to early subacute MCA infarcts. There is mild sulcal effacement without midline shift or other significant mass effect. No acute intracranial hemorrhage, mass, hydrocephalus, or extra axial fluid collection is identified. Calcified atherosclerosis at the skull base. No hyperdense vessel. ORBITS: Bilateral cataract extraction. SINUSES: Chronic right maxillary sinusitis with progressive, essentially complete opacification of the included portion of the sinus today. Mild bilateral ethmoid air cell mucosal thickening. Clear mastoid air cells. SOFT TISSUES AND SKULL: No acute soft tissue abnormality. No skull fracture. IMPRESSION: 1. Acute to early subacute left MCA infarcts. No hemorrhage. Electronically signed by: Dasie Hamburg MD 11/27/2023 01:39 PM EDT RP Workstation: [id number]   CT ABDOMEN PELVIS WO CONTRAST Result Date: 11/27/2023 CLINICAL DATA:  Abdominal pain EXAM: CT ABDOMEN AND PELVIS WITHOUT CONTRAST TECHNIQUE: Multidetector CT imaging of the abdomen and pelvis was performed following the standard protocol without IV contrast. RADIATION DOSE REDUCTION: This exam was performed according to the departmental dose-optimization program which includes automated exposure control, adjustment of the mA and/or kV according to patient size and/or use of iterative reconstruction technique. COMPARISON:  03/25/2023 FINDINGS: Lower chest: Trace bilateral pleural effusions. Airway thickening and lower lobe airspace opacities, left greater than  right could reflect atelectasis or bronchopneumonia. Coronary artery calcifications. Hepatobiliary: 5 views low-density throughout the liver compatible with fatty infiltration. No focal hepatic abnormality. Gallbladder unremarkable. Pancreas: No focal abnormality or ductal dilatation. Spleen: No focal abnormality.  Normal size. Adrenals/Urinary Tract: Right adrenal gland is normal. Stable left adrenal nodule which is low-density compatible with adenoma. No renal mass or hydronephrosis. Foley catheter in the bladder which is decompressed. Stomach/Bowel: Cough for 5 5 colonic diverticulosis. No active diverticulitis. Stomach and small bowel decompressed. No bowel obstruction or inflammatory process. Vascular/Lymphatic: Diffuse aortic atherosclerosis. No evidence of aneurysm or adenopathy. Reproductive: Prior hysterectomy. Left ovarian cyst measures 4.5 cm and is stable since prior study. Other: No free fluid or free air. Musculoskeletal: There appears to be a left abdominal wall wound. Air noted within the left abdominal wall underlying this wound and extending laterally in the left abdominal wall. Question recent surgical procedure. No acute bony abnormality. IMPRESSION: Apparent abdominal wall wound to the left of midline with underlying subcutaneous air extending laterally in the left abdominal wall. Question recent surgical procedure. If no recent procedure, this would be concerning for possible infection. No drainable focal fluid collection. Colonic diverticulosis.  No active diverticulitis. Stable 4.5 cm left ovarian cyst. Aortic atherosclerosis. Trace bilateral pleural effusions with bilateral lower lobe airway thickening and lower lobe opacities which could reflect atelectasis or developing bronchopneumonia. Electronically Signed   By: Franky Crease M.D.   On: 11/27/2023 13:37   DG Chest [facility]  1 View Result Date: 11/27/2023 CLINICAL DATA:  [id number] Endotracheal tube present [id number] EXAM: PORTABLE CHEST - 1  VIEW COMPARISON:  April 17, 2023 FINDINGS: Endotracheal tube terminates in the mid trachea. Patchy airspace opacities in the left lung base. Trace left pleural effusion. No pneumothorax. Mild cardiomegaly. Tortuous aorta with aortic atherosclerosis. No acute fracture or destructive lesions. Multilevel thoracic osteophytosis. Defibrillator pad overlying the left chest. IMPRESSION: 1. Patchy airspace opacities in the left lung base, possibly atelectasis or aspiration. Trace left pleural effusion. 2. Well-positioned endotracheal tube. Electronically Signed   By: Rogelia Myers M.D.   On: 11/27/2023 11:34     Assessment and Plan: Atrial fibrillation with RVR Onset this morning November 28, 2023, not on anticoagulation -Will hold off on heparin  infusion given multiple strokes with large territory - Would recommend we continue amiodarone  infusion, will repeat bolus 150 mg, dose of digoxin 0.25 IV x 1 if rate does not improve - Will hope to restore normal sinus rhythm on amiodarone  and would continue amiodarone  through her hospital course given high risk of recurrent arrhythmia  Septic shock Acute on chronic respiratory failure, intubated Lactic acid 6.8 on arrival now trending down to 0.4 Broad-spectrum antibiotics azithromycin  and Zosyn, cultures pending -No acute findings on CT abdomen pelvis following recent hernia surgery  Strokes Noted on CT scan in brain MRI -Possibly secondary to atrial fibrillation given multifocal ischemia left MCA territory, multiple small areas ischemia right hemisphere including right occipital lobe and right parietal lobe - Once cleared for anticoagulation from neurology, we can start heparin   Elevated troponin 300, peak 425 now trending downward In the setting of hypoxia, hypotension, tachycardia, strokes -Not a candidate for ischemic workup at this time  Acute renal failure Creatinine 1.7 on arrival, improving on recheck 1.28 - Likely ATN  For questions or  updates, please contact [facility] Please consult [email or web address] for contact info under   Signed, Adonia Porada, MD  11/28/2023 4:09 PM

## 2023-11-28 NOTE — Progress Notes (Addendum)
 PHARMACY CONSULT NOTE - ELECTROLYTES  Pharmacy Consult for Electrolyte Monitoring and Replacement   Recent Labs: Height: 5' 3 (160 cm) Weight: 88.3 kg (194 lb 10.7 oz) IBW/kg (Calculated) : 52.4 Estimated Creatinine Clearance: 40.7 mL/min (A) (by C-G formula based on SCr of 1.28 mg/dL (H)). Potassium (mmol/L)  Date Value  11/28/2023 4.2   Calcium  (mg/dL)  Date Value  89/76/7974 8.1 (L)   Albumin (g/dL)  Date Value  89/76/7974 2.7 (L)   Phosphorus (mg/dL)  Date Value  89/76/7974 2.4 (L)   Sodium (mmol/L)  Date Value  11/28/2023 139   Corrected Ca: 9.1 mg/dL  Assessment  [name] is a 73 y.o. female presenting with acute on chronic hypoxic and hypercapnic respiratory failure, AMS, and AKI. PMH significant for COPD, chronic hypoxic respiratory failure, hypothyroidism, HTN, anxiety. Pharmacy has been consulted to monitor and replace electrolytes.  Patient high-risk for refeeding.  Diet: NPO  Pertinent medications: n/a  Goal of Therapy: Electrolytes WNL  Plan:  Phos-NaK 280-160-250mg  packet x 1 per tube (Each packet contains 250 mg elemental phosphorus) Per dietary, monitor K, Phos, Mag daily until stable  Thank you for allowing pharmacy to be a part of this patient's care.  Bernardino George, PharmD Candidate [phone] [facility] 11/28/2023 7:42 AM

## 2023-11-28 NOTE — Plan of Care (Signed)
  Problem: Clinical Measurements: Goal: Ability to maintain clinical measurements within normal limits will improve Outcome: Progressing Goal: Respiratory complications will improve Outcome: Progressing Goal: Cardiovascular complication will be avoided Outcome: Progressing   Problem: Coping: Goal: Level of anxiety will decrease Outcome: Progressing   Problem: Elimination: Goal: Will not experience complications related to bowel motility Outcome: Progressing   Problem: Pain Managment: Goal: General experience of comfort will improve and/or be controlled Outcome: Progressing   Problem: Education: Goal: Knowledge of General Education information will improve Description: Including pain rating scale, medication(s)/side effects and non-pharmacologic comfort measures Outcome: Not Progressing   Problem: Activity: Goal: Risk for activity intolerance will decrease Outcome: Not Progressing   Problem: Nutrition: Goal: Adequate nutrition will be maintained Outcome: Not Progressing

## 2023-11-28 NOTE — Progress Notes (Signed)
 Pt's HR continues to be elevated  AFib RVR 130'140's after second bolus of Amio. Dr. Gollan notified. Per MD will order digoxin. Will continue to monitor.

## 2023-11-29 ENCOUNTER — Encounter: Payer: Self-pay | Admitting: Internal Medicine

## 2023-11-29 DIAGNOSIS — I480 Paroxysmal atrial fibrillation: Secondary | ICD-10-CM | POA: Diagnosis not present

## 2023-11-29 DIAGNOSIS — R579 Shock, unspecified: Secondary | ICD-10-CM | POA: Diagnosis not present

## 2023-11-29 DIAGNOSIS — I639 Cerebral infarction, unspecified: Secondary | ICD-10-CM | POA: Diagnosis not present

## 2023-11-29 DIAGNOSIS — J9601 Acute respiratory failure with hypoxia: Secondary | ICD-10-CM | POA: Diagnosis not present

## 2023-11-29 DIAGNOSIS — Z7189 Other specified counseling: Secondary | ICD-10-CM | POA: Diagnosis not present

## 2023-11-29 DIAGNOSIS — R7989 Other specified abnormal findings of blood chemistry: Secondary | ICD-10-CM

## 2023-11-29 LAB — RENAL FUNCTION PANEL
Albumin: 2.4 g/dL — ABNORMAL LOW (ref 3.5–5.0)
Anion gap: 10 (ref 5–15)
BUN: 33 mg/dL — ABNORMAL HIGH (ref 8–23)
CO2: 28 mmol/L (ref 22–32)
Calcium: 8 mg/dL — ABNORMAL LOW (ref 8.9–10.3)
Chloride: 100 mmol/L (ref 98–111)
Creatinine, Ser: 0.95 mg/dL (ref 0.44–1.00)
GFR, Estimated: >60 mL/min (ref >60)
Glucose, Bld: 239 mg/dL — ABNORMAL HIGH (ref 70–99)
Phosphorus: 1.7 mg/dL — ABNORMAL LOW (ref 2.5–4.6)
Potassium: 3.8 mmol/L (ref 3.5–5.1)
Sodium: 138 mmol/L (ref 135–145)

## 2023-11-29 LAB — CBC
HCT: 32.5 % — ABNORMAL LOW (ref 36.0–46.0)
Hemoglobin: 9.7 g/dL — ABNORMAL LOW (ref 12.0–15.0)
MCH: 28.8 pg (ref 26.0–34.0)
MCHC: 29.8 g/dL — ABNORMAL LOW (ref 30.0–36.0)
MCV: 96.4 fL (ref 80.0–100.0)
Platelets: 156 K/uL (ref 150–400)
RBC: 3.37 MIL/uL — ABNORMAL LOW (ref 3.87–5.11)
RDW: 14.5 % (ref 11.5–15.5)
WBC: 9.3 K/uL (ref 4.0–10.5)
nRBC: 0 % (ref 0.0–0.2)

## 2023-11-29 LAB — TRIGLYCERIDES: Triglycerides: 261 mg/dL — ABNORMAL HIGH (ref <150)

## 2023-11-29 LAB — LEGIONELLA PNEUMOPHILA SEROGP 1 UR AG: L. pneumophila Serogp 1 Ur Ag: NEGATIVE

## 2023-11-29 LAB — GLUCOSE, CAPILLARY
Glucose-Capillary: 212 mg/dL — ABNORMAL HIGH (ref 70–99)
Glucose-Capillary: 240 mg/dL — ABNORMAL HIGH (ref 70–99)
Glucose-Capillary: 117 mg/dL — ABNORMAL HIGH (ref 70–99)
Glucose-Capillary: 98 mg/dL (ref 70–99)
Glucose-Capillary: 182 mg/dL — ABNORMAL HIGH (ref 70–99)
Glucose-Capillary: 183 mg/dL — ABNORMAL HIGH (ref 70–99)

## 2023-11-29 LAB — MAGNESIUM: Magnesium: 1.8 mg/dL (ref 1.7–2.4)

## 2023-11-29 LAB — PROCALCITONIN: Procalcitonin: 0.42 ng/mL

## 2023-11-29 MED ORDER — POTASSIUM PHOSPHATES 15 MMOLE/5ML IV SOLN
30.0000 mmol | Freq: Once | INTRAVENOUS | Status: AC
  Administered 2023-11-29 (×2): 30 mmol via INTRAVENOUS
  Filled 2023-11-29: qty 10

## 2023-11-29 MED ORDER — INSULIN GLARGINE-YFGN 100 UNIT/ML ~~LOC~~ SOLN
15.0000 [IU] | Freq: Two times a day (BID) | SUBCUTANEOUS | Status: DC
  Administered 2023-11-29 – 2023-11-30 (×4): 15 [IU] via SUBCUTANEOUS
  Filled 2023-11-29 (×3): qty 0.15

## 2023-11-29 MED ORDER — LEVOTHYROXINE SODIUM 112 MCG PO TABS
112.0000 ug | ORAL_TABLET | Freq: Every day | ORAL | Status: DC
  Administered 2023-11-30 – 2023-12-01 (×4): 112 ug
  Filled 2023-11-29 (×2): qty 1

## 2023-11-29 MED ORDER — MAGNESIUM SULFATE 2 GM/50ML IV SOLN
2.0000 g | Freq: Once | INTRAVENOUS | Status: AC
  Administered 2023-11-29 (×2): 2 g via INTRAVENOUS
  Filled 2023-11-29: qty 50

## 2023-11-29 NOTE — Progress Notes (Addendum)
 Daily Progress Note   Patient Name: [name]       Date: 11/29/2023 DOB: 1951-01-04  Age: 73 y.o. MRN#: [id number] Attending Physician: Isaiah Scrivener, MD Primary Care Physician: Antonette Angeline ORN, NP Admit Date: 11/27/2023  Reason for Consultation/Follow-up: Establishing goals of care  Subjective: Notes and labs reviewed.  Updates provided by CCM, RT, and other team members.  Patient currently in process of SBT.  Plans in place to medically optimize and extubate when ready.   In to see patient.  She is currently resting in bed at this time, eyes open gazing to the wall.  She does not track or try to turn her head to look at me.  She does attempt to cough.  Her daughter-in-law and another family member are at bedside.  They discuss their conversations with CCM.  They are able to accurately articulate current plans moving forward.  DIL states God is in control of this.    She states there have been friends, family members, and church members that have been coming to visit.   Length of Stay: 2  Current Medications: Scheduled Meds:   Chlorhexidine  Gluconate Cloth  6 each Topical Daily   docusate  100 mg Per Tube BID   enoxaparin  (LOVENOX ) injection  40 mg Subcutaneous Q24H   free water  30 mL Per Tube Q4H   insulin  aspart  0-20 Units Subcutaneous Q4H   insulin  glargine-yfgn  15 Units Subcutaneous BID   ipratropium-albuterol   3 mL Nebulization TID   [START ON 11/30/2023] levothyroxine   112 mcg Per Tube Q0600   nutrition supplement (JUVEN)  1 packet Per Tube BID BM   mouth rinse  15 mL Mouth Rinse Q2H   pantoprazole  (PROTONIX ) IV  40 mg Intravenous QHS   polyethylene glycol  17 g Per Tube Daily   sodium chloride  flush  10-40 mL Intracatheter Q12H   thiamine  100 mg Per Tube Daily     Continuous Infusions:  amiodarone  30 mg/hr (11/29/23 0934)   azithromycin  500 mg (11/29/23 1220)   feeding supplement (PIVOT 1.5 CAL) 20 mL/hr at 11/29/23 0934   norepinephrine (LEVOPHED) Adult infusion Stopped (11/29/23 0843)   piperacillin-tazobactam (ZOSYN)  IV 12.5 mL/hr at 11/29/23 0934   propofol  (DIPRIVAN ) infusion Stopped (11/29/23 0843)   vasopressin Stopped (11/29/23 0911)  PRN Meds: acetaminophen , docusate sodium , fentaNYL  (SUBLIMAZE ) injection, fentaNYL  (SUBLIMAZE ) injection, mouth rinse, polyethylene glycol, sodium chloride  flush  Physical Exam Constitutional:      Comments: Eyes open, but she does not track or try to turn her head to look at me  Pulmonary:     Comments: Ventilator in place            Vital Signs: BP (!) 138/45   Pulse 84   Temp 100.2 F (37.9 C)   Resp (!) 24   Ht 5' 3 (1.6 m)   Wt 94.7 kg   LMP  (LMP Unknown)   SpO2 100%   BMI 36.98 kg/m  SpO2: SpO2: 100 % O2 Device: O2 Device: Ventilator O2 Flow Rate:    Intake/output summary:  Intake/Output Summary (Last 24 hours) at 11/29/2023 1250 Last data filed at 11/29/2023 9065 Gross per 24 hour  Intake 2244.41 ml  Output 1210 ml  Net 1034.41 ml   LBM: Last BM Date :  (11/28/2023) Baseline Weight: Weight: 80 kg Most recent weight: Weight: 94.7 kg   Patient Active Problem List   Diagnosis Date Noted   Altered mental status 11/28/2023   Acute respiratory failure with hypoxia and hypercapnia (HCC) 11/28/2023   PAF (paroxysmal atrial fibrillation) (HCC) 11/28/2023   Acute stroke due to ischemia (HCC) 11/28/2023   Unresponsive 11/27/2023   Sepsis (HCC) 11/27/2023   Shock (HCC) 11/27/2023    Palliative Care Assessment & Plan    Recommendations/Plan: CCM medically optimizing to extubate.  Code Status:    Code Status Orders  (From admission, onward)           Start     Ordered   11/27/23 1247  Full code  Continuous       Question:  By:  Answer:  Default: patient  does not have capacity for decision making, no surrogate or prior directive available   11/27/23 1246           Code Status History     This patient has a current code status but no historical code status.      Camelia Lewis, NP  Please contact Palliative Medicine Team phone at [phone] for questions and concerns.

## 2023-11-29 NOTE — Progress Notes (Signed)
   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

## 2023-11-29 NOTE — Progress Notes (Signed)
 NAME:  [name], MRN:  [id number], DOB:  [birth year], LOS: 2 ADMISSION DATE:  11/27/2023, CONSULTATION DATE:  11/27/2023 REFERRING MD:  Dr. Willo, CHIEF COMPLAINT:  Unresponsive, hypoxia    Brief Pt Description / Synopsis:  73 year old female with past medical history significant for COPD on 2 L supplemental oxygen , hypothyroidism and recent robotic assisted umbilical hernia repair on 11/25/23, who is admitted with acute metabolic encephalopathy and acute on chronic hypoxic and hypercapnic respiratory failure, sepsis due to suspected aspiration pneumonia, and acute kidney injury requiring intubation and mechanical ventilation.  Found to have acute to subacute large left MCA infarcts, along with multiple small areas of acute/subacute infarcts in the right occipital lobe and right parietal lobes.   History of Present Illness:  [name] is a 73 year old female with a past medical history significant for COPD, chronic hypoxic respiratory failure requiring 2 L supplemental oxygen , hypothyroidism, hypertension, and anxiety who presents to [facility] ED on 11/27/2023 for evaluation of altered mental status.  Patient is currently altered and on the ventilator and unable to contribute to history, and no family currently available, therefore history is obtained from chart review.  It is reported that she was found down and minimally responsive by family earlier this morning.  She underwent robotic assisted umbilical hernia repair surgery on 11/25/2023 by Dr Marinda and was discharged home yesterday 10/21.  Her last known well time was approximately 8:00 PM last night night prior to bedtime, where her son talked to her over the telephone and said she sounded fine.  On his way home from work this morning, he attempted to call her of which she did not answer.  He went by her house to check on her and she did not come to the door.   Upon EMS arrival she was noted to be hypoxic with sats in the 50s on room air which  she was subsequent placed on nonrebreather with improvement of saturations to the 70s.  There was also concern for possible tongue swelling which she was given IM epinephrine  by EMS.  On arrival to the ED she remained unresponsive  and somnolent, with gurgling respirations and with significant thick white secretions in the posterior oropharynx.  She required emergent intubation by ED provider.  ED provider did not appreciate any significant tongue/lip, or airway edema to suggest angioedema or anaphylaxis.   ED Course: Initial Vital Signs: Temperature 94.7 F, pulse 93, respiratory rate 16, blood pressure 84/38, SpO2 100% via ventilator Significant Labs: BNP 258, high-sensitivity troponin 48, WBC 17.6, lactic acid 6.8, TSH 1.8, bicarbonate 19, anion gap 23, BUN 29, creatinine 1.7, AST 127, ALT 58, glucose 256 VBG: pH 7.1/pCO2 81/pO2 74/bicarb 25.1 Urinalysis negative for UTI Urine drug screen positive for benzodiazepines Imaging Chest X-ray>>IMPRESSION: 1. Patchy airspace opacities in the left lung base, possibly atelectasis or aspiration. Trace left pleural effusion. 2. Well-positioned endotracheal tube. CT Head>>IMPRESSION: 1. Acute to early subacute left MCA infarcts. No hemorrhage. CT Abdomen & Pelvis wo contrast>>IMPRESSION: Apparent abdominal wall wound to the left of midline with underlying subcutaneous air extending laterally in the left abdominal wall. Question recent surgical procedure. If no recent procedure, this would be concerning for possible infection. No drainable focal fluid collection. Colonic diverticulosis.  No active diverticulitis. Stable 4.5 cm left ovarian cyst. Aortic atherosclerosis. Trace bilateral pleural effusions with bilateral lower lobe airway thickening and lower lobe opacities which could reflect atelectasis or developing bronchopneumonia. Medications Administered: 2 L IV fluid boluses, peripheral Levophed started, IV vancomycin  and Zosyn  PCCM asked to  admit for further workup and treatment.  Please see Significant Hospital Events section below for full detailed hospital course.   Pertinent  Medical History   Past Medical History:  Diagnosis Date   COPD (chronic obstructive pulmonary disease) (HCC)     Micro Data:  10/22: Blood cultures x 2>>no growth to date  10/22: COVID/flu/RSV PCR>> negative 10/22: Respiratory viral panel>> negative 10/22: Strep pneumo urinary antigen>>negative  10/22: Legionella urinary antigen>> 10/22: MRSA PCR>> negative  10/22: Tracheal aspirate>>  Antimicrobials:   Anti-infectives (From admission, onward)    Start     Dose/Rate Route Frequency Ordered Stop   11/27/23 1600  piperacillin-tazobactam (ZOSYN) IVPB 3.375 g        3.375 g 12.5 mL/hr over 240 Minutes Intravenous Every 8 hours 11/27/23 1447     11/27/23 1315  azithromycin  (ZITHROMAX ) 500 mg in sodium chloride  0.9 % 250 mL IVPB        500 mg 250 mL/hr over 60 Minutes Intravenous Every 24 hours 11/27/23 1308 11/30/23 1314   11/27/23 1215  piperacillin-tazobactam (ZOSYN) IVPB 3.375 g        3.375 g 100 mL/hr over 30 Minutes Intravenous  Once 11/27/23 1212 11/27/23 1312   11/27/23 1215  vancomycin (VANCOCIN) IVPB 1000 mg/200 mL premix        1,000 mg 200 mL/hr over 60 Minutes Intravenous  Once 11/27/23 1212 11/27/23 2302   11/27/23 1200  Ampicillin-Sulbactam (UNASYN) 3 g in sodium chloride  0.9 % 100 mL IVPB  Status:  Discontinued        3 g 200 mL/hr over 30 Minutes Intravenous  Once 11/27/23 1155 11/27/23 1212       Significant Hospital Events: Including procedures, antibiotic start and stop dates in addition to other pertinent events   10/22: Presented to ED unresponsive and hypoxic requiring emergent intubation by ED provider.  Postintubation with hypotension requiring IV fluids and initiation of peripheral Levophed.  PCCM asked to admit.  CT Head concerning for acute to subacute Left MCA infarcts, Neurology consulted.  10/23: MRI  overnight showing large areas of multifocal acute/early subacute ischemia within the left MCA territory, along with multiple small areas of acute/early subacute ischemia in the right hemisphere including the right occipital and parietal lobes. Levophed weaned down with addition of vasopressin.  Remains on vent. Renal function improving, UOP 1L last 24 hrs (net +1.1 L).  Developed A.fib with RVR requiring Amiodarone  bolus and infusion, Cardiology consulted.  10/24: No significant events overnight. Low grade temperature this morning (T max 100.5), Leukocytosis has resolved, but with increase in secretions from ETT, will obtain Tracheal aspirate.  Converted back to NSR on Amiodarone  gtt, remains on Levophed and Vasopressin.  On minimal vent support, WUA & SBT as tolerated. AKI continues to improve, UOP 1.7 L last 24 hrs (net + 1.7 L).  Interim History / Subjective:  As outlined above under Significant Hospital Events section  Objective   Blood pressure (!) 107/52, pulse 68, temperature 100.2 F (37.9 C), resp. rate (!) 24, height 5' 3 (1.6 m), weight 94.7 kg, SpO2 99%.    Vent Mode: PRVC FiO2 (%):  [40 %-50 %] 40 % Set Rate:  [24 bmp] 24 bmp Vt Set:  [400 mL] 400 mL PEEP:  [8 cmH20] 8 cmH20 Plateau Pressure:  [16 cmH20-18 cmH20] 17 cmH20   Intake/Output Summary (Last 24 hours) at 11/29/2023 0805 Last data filed at 11/29/2023 0715 Gross per 24 hour  Intake  2015.01 ml  Output 1210 ml  Net 805.01 ml   Filed Weights   11/27/23 1505 11/28/23 0500 11/29/23 0410  Weight: 87.7 kg 88.3 kg 94.7 kg    Examination: General: Critically ill-appearing on chronically ill-appearing obese female, laying in bed, intubated, sedation off for WUA, no acute distress HENT: Atraumatic, normocephalic, neck supple, difficult to assess JVD due to body habitus, orally intubated Lungs: Coarse breath sounds throughout, even, nonlabored, overbreathing the vent Cardiovascular: Regular rate and rhythm (NSR on  telemetry),  no murmurs, rubs, gallops Abdomen: Limited abdominal exam due to altered mental status and intubation: Obese, nontender, no guarding or rebound tenderness, bowel sounds hypoactive, 3 small incisions to abdomen from recent hernia repair which are all dermabonded clean dry and intact Extremities: Normal bulk and tone, no deformities, no edema Neuro: Off sedation for WUA, opens eyes intermittently, seems to track to voice, currently not following commands, nursing reports intermittent agitation with movement of left side (limited motion of right side), PERRLA GU: Foley catheter in place draining yellow urine   Resolved Hospital Problem list     Assessment & Plan:   #Shock: Septic +/- Cardiogenic  #Atrial Fibrillation with RVR ~ CONVERTED BACK TO NSR  #Mildly Elevated Troponin, suspect demand ischemia  PMHx: HTN -Echocardiogram 11/27/23: LVEF 60-65%, Grade I DD, RV systolic function normal, RV size normal, normal pulmonary artery systolic pressure -Continuous cardiac monitoring -Maintain MAP >65 -IV fluids -Vasopressors as needed to maintain MAP goal -Trend lactic acid until normalized -HS Troponin peaked at 425 -Continue Amiodarone  infusion -Cardiology consulted, appreciate input ~ will hold off on anticoagulation for now given risk of hemorrhagic conversion of strokes ~ will defer to Neurology on timing for anticoagulation/antiplatelets   #Acute on Chronic Hypoxic & Hypercapnic Respiratory Failure due to  #Suspected Pneumonia: Aspiration vs CAP  #COPD without acute exacerbation  #Intubated for Airway Protection  PMHx: COPD on 2L supplemental O2 -Full vent support, implement lung protective strategies -Plateau pressures less than 30 cm H20 -Wean FiO2 & PEEP as tolerated to maintain O2 sats 88 to 92% -Follow intermittent Chest X-ray & ABG as needed -Spontaneous Breathing Trials when respiratory parameters met and mental status permits -Implement VAP  Bundle -Bronchodilators -ABX as above  #Severe Sepsis (Met SIRS Criteria on presentation: Temp. 94.7, Pulse 93, WBC 17.6) due to ... #Concern for Pneumonia: Aspiration vs CAP PMHx: recent robotic assisted laparoscopic umbilical hernia repair on 11/25/23 -Monitor fever curve -Trend WBC's & Procalcitonin -Follow cultures as above -Continue empiric Azithromycin  & Zosyn pending cultures & sensitivities -CT Abdomen & Pelvis reviewed by Dr. Marinda ~ felt nothing acute on imaging to explain her decompensation   #AKI ~ IMPROVING  #Anion Gap Metabolic Acidosis ~ RESOLVED  #Lactic Acidosis  -Monitor I&O's / urinary output -Follow BMP -Ensure adequate renal perfusion -Avoid nephrotoxic agents as able -Replace electrolytes as indicated ~ Pharmacy following for assistance with electrolyte replacement -IV Fluids   #Diabetes Mellitus with Hyperglycemia -CBG's q4h; Target range of 140 to 180 -SSI -Follow ICU Hypo/Hyperglycemia protocol  #Acute Metabolic Encephalopathy #Acute to Subacute large Left MCA Infarcts  #CO2 Narcosis #Sedation needs in setting of mechanical ventilation PMHx: Depression, anxiety  MRI Brain 10/23: Large areas of multifocal acute/subacute ischemia within the left MCA and multiple small areas of acute/subacute ischemia in the right hemisphere including the right occipital lobe and right parietal lobe  -UDS + for benzodiazepines  -Treatment of metabolic derangements and Hypercapnia as outlined above  -Maintain a RASS goal of 0 to -1 -  Fentanyl  and Propofol  prn to maintain RASS goal -Avoid sedating medications as able -Daily wake up assessment -TSH normal at 1.8 -Neurology following, appreciate input       Best Practice (right click and Reselect all SmartList Selections daily)   Diet/type: NPO, tube feeds  DVT prophylaxis: Lovenox  GI prophylaxis: PPI Lines: central line and is still needed Foley:  yes, and still needed Code Status:  full code Last date of  multidisciplinary goals of care discussion [10/24]  10/43: Updated pt's sons at bedside on plan of care.   Labs   CBC: Recent Labs  Lab 11/27/23 1013 11/28/23 0415 11/29/23 0404  WBC 17.6* 12.5* 9.3  NEUTROABS 12.7*  --   --   HGB 12.3 10.7* 9.7*  HCT 42.8 35.9* 32.5*  MCV 100.0 95.7 96.4  PLT 333 213 156    Basic Metabolic Panel: Recent Labs  Lab 11/27/23 1130 11/28/23 0415 11/29/23 0404  NA 142 139 138  K 3.8 4.2 3.8  CL 100 104 100  CO2 19* 25 28  GLUCOSE 256* 207* 239*  BUN 29* 35* 33*  CREATININE 1.70* 1.28* 0.95  CALCIUM  8.5* 8.1* 8.0*  MG  --   --  1.8  PHOS  --  2.4* 1.7*   GFR: Estimated Creatinine Clearance: 56.8 mL/min (by C-G formula based on SCr of 0.95 mg/dL). Recent Labs  Lab 11/27/23 1013 11/27/23 1118 11/27/23 1246 11/27/23 1816 11/27/23 2028 11/28/23 0053 11/28/23 0415 11/29/23 0404  PROCALCITON  --   --   --  0.74  --   --   --   --   WBC 17.6*  --   --   --   --   --  12.5* 9.3  LATICACIDVEN  --    < > 6.0* 3.1* 3.0* 2.4*  --   --    < > = values in this interval not displayed.    Liver Function Tests: Recent Labs  Lab 11/27/23 1130 11/28/23 0415 11/29/23 0404  AST 127*  --   --   ALT 58*  --   --   ALKPHOS 71  --   --   BILITOT 0.8  --   --   PROT 6.8  --   --   ALBUMIN 3.2* 2.7* 2.4*   No results for input(s): LIPASE, AMYLASE in the last 168 hours. No results for input(s): AMMONIA in the last 168 hours.  ABG    Component Value Date/Time   PHART 7.37 11/28/2023 0650   PCO2ART 52 (H) 11/28/2023 0650   PO2ART 77 (L) 11/28/2023 0650   HCO3 30.1 (H) 11/28/2023 0650   ACIDBASEDEF 1.4 11/27/2023 2023   O2SAT 98.4 11/28/2023 0650     Coagulation Profile: No results for input(s): INR, PROTIME in the last 168 hours.  Cardiac Enzymes: No results for input(s): CKTOTAL, CKMB, CKMBINDEX, TROPONINI in the last 168 hours.  HbA1C: Hgb A1c MFr Bld  Date/Time Value Ref Range Status  11/27/2023 08:27 PM 6.8  (H) 4.8 - 5.6 % Final    Comment:    (NOTE) Diagnosis of Diabetes The following HbA1c ranges recommended by the American Diabetes Association (ADA) may be used as an aid in the diagnosis of diabetes mellitus.  Hemoglobin             Suggested A1C NGSP%              Diagnosis  <5.7  Non Diabetic  5.7-6.4                Pre-Diabetic  >6.4                   Diabetic  <7.0                   Glycemic control for                       adults with diabetes.      CBG: Recent Labs  Lab 11/28/23 1641 11/28/23 1939 11/28/23 2326 11/29/23 0315 11/29/23 0720  GLUCAP 218* 223* 213* 212* 240*    Review of Systems:   Unable to assess due to AMS/intubation/sedation/critical illness   Past Medical History:  She,  has a past medical history of COPD (chronic obstructive pulmonary disease) (HCC).   Surgical History:     Social History:      Family History:  Her family history is not on file.   Allergies Allergies  Allergen Reactions   Gabapentin  Other (See Comments)    Hallucinations    Trazodone  And Nefazodone Other (See Comments)    Altered Mental Status   Ciprofloxacin Hives and Diarrhea   Mucinex  [Guaifenesin  Er] Nausea Only   Sulfa Antibiotics Rash     Home Medications  Prior to Admission medications   Not on File     Critical care time: 40 minutes     Inge Lecher, AGACNP-BC [facility] Prefer epic messenger for cross cover needs If after hours, please call E-link

## 2023-11-29 NOTE — IPAL (Signed)
  Interdisciplinary Goals of Care Family Meeting   Date carried out: 11/29/2023  Location of the meeting: Conference room  Member's involved: Physician, Nurse Practitioner, Bedside Registered Nurse, and Family Member or next of kin    GOALS OF CARE DISCUSSION  The Clinical status was relayed to family in detail-Son, sister, daughter   Updated and notified of patients medical condition-massive stroke  Patient with increased WOB and using accessory muscles to breathe Explained to family course of therapy and the modalities  Patient with Progressive multiorgan failure with a very high probablity of a very minimal chance of meaningful recovery despite all aggressive and optimal medical therapy.     They have consented and agreed to DNR status  Family are satisfied with Plan of action and management. All questions answered  Additional CC time 35 mins   [name], M.D.  [facility]  Medical Director [facility] Medical Director [facility] Cardio-Pulmonary Department

## 2023-11-29 NOTE — Progress Notes (Signed)
 Cardiology Consultation:  Patient ID: [name] MRN: [id number]; DOB: [birth year]  Admit date: 11/27/2023 Date of Consult: 11/29/2023  Primary Care Provider: Antonette Angeline ORN, NP Primary Cardiologist: None  Primary Electrophysiologist:  None   Patient Profile:  [name] is a 73 y.o. female with a hx of chronic hypoxemic respiratory failure on home 2 L, COPD, HTN who is being seen today for the evaluation of rapid atrial fibrillation in the setting of left MCA stroke and multiple small right occipital lobe and right parietal lobe strokes.  Interval history:  Converted to NSR yesterday.  No arrhythmias overnight.  Still on low-dose pressor.  Normal LV function on echo.  Intubated.  Past Medical History: Past Medical History:  Diagnosis Date   COPD (chronic obstructive pulmonary disease) (HCC)     Past Surgical History: Past Surgical History:  Procedure Laterality Date   hernia repair surgery       Allergies:    Allergies  Allergen Reactions   Gabapentin  Other (See Comments)    Hallucinations    Trazodone  And Nefazodone Other (See Comments)    Altered Mental Status   Ciprofloxacin Hives and Diarrhea   Mucinex  [Guaifenesin  Er] Nausea Only   Sulfa Antibiotics Rash    Social History:   Social History   Socioeconomic History   Marital status: Widowed    Spouse name: Not on file   Number of children: Not on file   Years of education: Not on file   Highest education level: Not on file  Occupational History   Not on file  Tobacco Use   Smoking status: Not on file   Smokeless tobacco: Not on file  Substance and Sexual Activity   Alcohol  use: Not on file   Drug use: Not on file   Sexual activity: Not on file  Other Topics Concern   Not on file  Social History Narrative   Not on file   Social Drivers of Health   Financial Resource Strain: Not on file  Food Insecurity: Patient Unable To Answer (11/28/2023)   Hunger Vital Sign    Worried About  Running Out of Food in the Last Year: Patient unable to answer    Ran Out of Food in the Last Year: Patient unable to answer  Transportation Needs: Patient Unable To Answer (11/28/2023)   PRAPARE - Transportation    Lack of Transportation (Medical): Patient unable to answer    Lack of Transportation (Non-Medical): Patient unable to answer  Physical Activity: Not on file  Stress: Not on file  Social Connections: Patient Unable To Answer (11/28/2023)   Social Connection and Isolation Panel    Frequency of Communication with Friends and Family: Patient unable to answer    Frequency of Social Gatherings with Friends and Family: Patient unable to answer    Attends Religious Services: Patient unable to answer    Active Member of Clubs or Organizations: Patient unable to answer    Attends Banker Meetings: Patient unable to answer    Marital Status: Patient unable to answer  Intimate Partner Violence: Unknown (11/28/2023)   Humiliation, Afraid, Rape, and Kick questionnaire    Fear of Current or Ex-Partner: Not on file    Emotionally Abused: Patient unable to answer    Physically Abused: Patient unable to answer    Sexually Abused: Patient unable to answer     Family History:   History reviewed. No pertinent family history.   ROS:  All other ROS reviewed and  negative. Pertinent positives noted in the HPI.     Physical Exam/Data:   Vitals:   11/29/23 0500 11/29/23 0600 11/29/23 0703 11/29/23 0930  BP: 121/60 (!) 120/50 (!) 107/52 (!) 130/45  Pulse: 61 62 68 83  Resp: (!) 21 15 (!) 24 (!) 25  Temp: 99.7 F (37.6 C) 100 F (37.8 C) 100.2 F (37.9 C) (!) 100.4 F (38 C)  TempSrc:      SpO2: 100% 100% 99% 100%  Weight:      Height:        Intake/Output Summary (Last 24 hours) at 11/29/2023 1000 Last data filed at 11/29/2023 0934 Gross per 24 hour  Intake 2274.41 ml  Output 1210 ml  Net 1064.41 ml       11/29/2023    4:10 AM 11/28/2023    5:00 AM 11/27/2023     3:05 PM  Last 3 Weights  Weight (lbs) 208 lb 12.4 oz 194 lb 10.7 oz 193 lb 6.9 oz  Weight (kg) 94.7 kg 88.3 kg 87.74 kg    Body mass index is 36.98 kg/m.  General: intubated and sedated Neck: no JVD Cardiac: Normal S1, S2; RRR; no murmurs, rubs, or gallops Lungs: Clear to auscultation bilaterally Ext: No edema Skin: Warm and dry Neuro: intubated and sedated Psych: unable to assess  EKG:  The EKG was personally reviewed and demonstrates: No new tracing Telemetry:  Telemetry was personally reviewed and demonstrates: Sinus rhythm  Relevant CV Studies: -TTE 11/28/2023 normal biventricular function without significant valvular disease - MRI 11/27/2023 large left MCA stroke and multiple small areas of acute ischemia right septal lobe and right parietal lobe  Assessment and Plan:  Rapid atrial fibrillation Septic shock Acute hypoxemic respiratory failure Acute CVA, multiple Patient presents with rapid atrial fibrillation in the setting of multiple underlying triggers including acute hypoxic respiratory failure, septic shock, and multiple strokes.  She is progressing and being weaned off pressors.  She converted to sinus rhythm with the administration of amiodarone .  Not a candidate for anticoagulation currently given multiple recent acute strokes.  Possible/likely the strokes were due to atrial fibrillation.  Normal LV function on echo.  Plan: - Would advance anticoagulation with heparin  once acceptable from a neurological intracranial bleeding risk perspective - Would continue 7 to 10 g amiodarone  load; now that she is in sinus rhythm, can convert to p.o. whenever appropriate from a critical care perspective.  Once a full load has been achieved, then would do 200 mg daily for maintenance - Continue abx and treatment of other underlying triggers   Elevated troponin Likely due to demand given septic shock and multiple strokes. Troponin peak 425.  Not a candidate for invasive angiography at  this time.  Can consider ischemic evaluation down the road pending neurologic and general recovery trajectory   For questions or updates, please contact [facility] Please consult [email or web address] for contact info under    Signed, Caron Poser, MD  [place]  [facility]  11/29/2023 10:00 AM

## 2023-11-29 NOTE — Plan of Care (Signed)
 Patient remains intubated, WUA performed this am, propofol  titrated to maintain MAP goal. Patient made a DNR. Family at bedside throughout the day. Continue to assess.

## 2023-11-29 NOTE — Progress Notes (Signed)
 PHARMACY CONSULT NOTE - ELECTROLYTES  Pharmacy Consult for Electrolyte Monitoring and Replacement   Recent Labs: Height: 5' 3 (160 cm) Weight: 94.7 kg (208 lb 12.4 oz) IBW/kg (Calculated) : 52.4 Estimated Creatinine Clearance: 56.8 mL/min (by C-G formula based on SCr of 0.95 mg/dL). Potassium (mmol/L)  Date Value  11/29/2023 3.8   Magnesium  (mg/dL)  Date Value  89/75/7974 1.8   Calcium  (mg/dL)  Date Value  89/75/7974 8.0 (L)   Albumin (g/dL)  Date Value  89/75/7974 2.4 (L)   Phosphorus (mg/dL)  Date Value  89/75/7974 1.7 (L)   Sodium (mmol/L)  Date Value  11/29/2023 138   Corrected Ca: 9.3 mg/dL  Assessment  [name] is a 73 y.o. female presenting with acute on chronic hypoxic and hypercapnic respiratory failure, AMS, and AKI. PMH significant for COPD, chronic hypoxic respiratory failure, hypothyroidism, HTN, anxiety. Pharmacy has been consulted to monitor and replace electrolytes.  Patient high-risk for refeeding.  Diet: NPO  Pertinent medications: n/a  Goal of Therapy: Electrolytes WNL  Plan:  Potassium phosphate 30 mmol @85  mL/hr ordered by provider this AM Per dietary, monitor K, Phos, Mag daily until stable  Thank you for allowing pharmacy to be a part of this patient's care.  Bernardino George, PharmD Candidate [phone] [facility] 11/29/2023 7:53 AM

## 2023-11-29 NOTE — Plan of Care (Signed)
  Problem: Clinical Measurements: Goal: Ability to maintain clinical measurements within normal limits will improve Outcome: Progressing Goal: Will remain free from infection Outcome: Progressing Goal: Diagnostic test results will improve Outcome: Progressing Goal: Respiratory complications will improve Outcome: Progressing Goal: Cardiovascular complication will be avoided Outcome: Progressing   Problem: Nutrition: Goal: Adequate nutrition will be maintained Outcome: Progressing   Problem: Pain Managment: Goal: General experience of comfort will improve and/or be controlled Outcome: Progressing   Problem: Skin Integrity: Goal: Risk for impaired skin integrity will decrease Outcome: Progressing

## 2023-11-29 NOTE — Care Management Important Message (Signed)
 Important Message  Patient Details  Name: [name] MRN: [id number] Date of Birth: 05/04/1950   Important Message Given:  Yes - Medicare IM     [name] 11/29/2023, 1:25 PM

## 2023-11-30 ENCOUNTER — Inpatient Hospital Stay

## 2023-11-30 DIAGNOSIS — T17908A Unspecified foreign body in respiratory tract, part unspecified causing other injury, initial encounter: Secondary | ICD-10-CM

## 2023-11-30 DIAGNOSIS — A419 Sepsis, unspecified organism: Secondary | ICD-10-CM | POA: Diagnosis not present

## 2023-11-30 DIAGNOSIS — R570 Cardiogenic shock: Secondary | ICD-10-CM

## 2023-11-30 DIAGNOSIS — R4182 Altered mental status, unspecified: Secondary | ICD-10-CM | POA: Diagnosis not present

## 2023-11-30 DIAGNOSIS — J9601 Acute respiratory failure with hypoxia: Secondary | ICD-10-CM | POA: Diagnosis not present

## 2023-11-30 DIAGNOSIS — R579 Shock, unspecified: Secondary | ICD-10-CM | POA: Diagnosis not present

## 2023-11-30 DIAGNOSIS — Z515 Encounter for palliative care: Secondary | ICD-10-CM | POA: Diagnosis not present

## 2023-11-30 DIAGNOSIS — Z789 Other specified health status: Secondary | ICD-10-CM | POA: Diagnosis not present

## 2023-11-30 DIAGNOSIS — Z66 Do not resuscitate: Secondary | ICD-10-CM | POA: Diagnosis not present

## 2023-11-30 DIAGNOSIS — J9602 Acute respiratory failure with hypercapnia: Secondary | ICD-10-CM | POA: Diagnosis not present

## 2023-11-30 LAB — GLUCOSE, CAPILLARY
Glucose-Capillary: 245 mg/dL — ABNORMAL HIGH (ref 70–99)
Glucose-Capillary: 216 mg/dL — ABNORMAL HIGH (ref 70–99)
Glucose-Capillary: 159 mg/dL — ABNORMAL HIGH (ref 70–99)
Glucose-Capillary: 207 mg/dL — ABNORMAL HIGH (ref 70–99)
Glucose-Capillary: 248 mg/dL — ABNORMAL HIGH (ref 70–99)
Glucose-Capillary: 208 mg/dL — ABNORMAL HIGH (ref 70–99)

## 2023-11-30 LAB — RENAL FUNCTION PANEL
Albumin: 2.7 g/dL — ABNORMAL LOW (ref 3.5–5.0)
Anion gap: 10 (ref 5–15)
BUN: 25 mg/dL — ABNORMAL HIGH (ref 8–23)
CO2: 30 mmol/L (ref 22–32)
Calcium: 8.4 mg/dL — ABNORMAL LOW (ref 8.9–10.3)
Chloride: 100 mmol/L (ref 98–111)
Creatinine, Ser: 0.91 mg/dL (ref 0.44–1.00)
GFR, Estimated: >60 mL/min (ref >60)
Glucose, Bld: 260 mg/dL — ABNORMAL HIGH (ref 70–99)
Phosphorus: 2.5 mg/dL (ref 2.5–4.6)
Potassium: 3.9 mmol/L (ref 3.5–5.1)
Sodium: 140 mmol/L (ref 135–145)

## 2023-11-30 LAB — CBC
HCT: 36.7 % (ref 36.0–46.0)
Hemoglobin: 11.3 g/dL — ABNORMAL LOW (ref 12.0–15.0)
MCH: 28.9 pg (ref 26.0–34.0)
MCHC: 30.8 g/dL (ref 30.0–36.0)
MCV: 93.9 fL (ref 80.0–100.0)
Platelets: 214 K/uL (ref 150–400)
RBC: 3.91 MIL/uL (ref 3.87–5.11)
RDW: 14.9 % (ref 11.5–15.5)
WBC: 10.8 K/uL — ABNORMAL HIGH (ref 4.0–10.5)
nRBC: 0 % (ref 0.0–0.2)

## 2023-11-30 LAB — MAGNESIUM: Magnesium: 2.3 mg/dL (ref 1.7–2.4)

## 2023-11-30 MED ORDER — HYDRALAZINE HCL 20 MG/ML IJ SOLN
10.0000 mg | INTRAMUSCULAR | Status: DC | PRN
  Administered 2023-11-30 (×4): 10 mg via INTRAVENOUS
  Filled 2023-11-30: qty 1

## 2023-11-30 MED ORDER — INSULIN GLARGINE-YFGN 100 UNIT/ML ~~LOC~~ SOLN
18.0000 [IU] | Freq: Two times a day (BID) | SUBCUTANEOUS | Status: DC
  Administered 2023-11-30 – 2023-12-01 (×4): 18 [IU] via SUBCUTANEOUS
  Filled 2023-11-30 (×3): qty 0.18

## 2023-11-30 MED ORDER — METHYLPREDNISOLONE SODIUM SUCC 40 MG IJ SOLR
20.0000 mg | Freq: Two times a day (BID) | INTRAMUSCULAR | Status: DC
  Administered 2023-11-30 – 2023-12-01 (×6): 20 mg via INTRAVENOUS
  Filled 2023-11-30 (×3): qty 1

## 2023-11-30 NOTE — Progress Notes (Signed)
 PHARMACY CONSULT NOTE - ELECTROLYTES  Pharmacy Consult for Electrolyte Monitoring and Replacement   Recent Labs: Height: 5' 3 (160 cm) Weight: 89.9 kg (198 lb 3.1 oz) IBW/kg (Calculated) : 52.4 Estimated Creatinine Clearance: 57.7 mL/min (by C-G formula based on SCr of 0.91 mg/dL). Potassium (mmol/L)  Date Value  11/30/2023 3.9   Magnesium  (mg/dL)  Date Value  89/74/7974 2.3   Calcium  (mg/dL)  Date Value  89/74/7974 8.4 (L)   Albumin (g/dL)  Date Value  89/74/7974 2.7 (L)   Phosphorus (mg/dL)  Date Value  89/74/7974 2.5   Sodium (mmol/L)  Date Value  11/30/2023 140   Corrected Ca: 9.3 mg/dL  Assessment  [name] is a 73 y.o. female presenting with acute on chronic hypoxic and hypercapnic respiratory failure, AMS, and AKI. PMH significant for COPD, chronic hypoxic respiratory failure, hypothyroidism, HTN, anxiety. Pharmacy has been consulted to monitor and replace electrolytes.  Patient high-risk for refeeding.  Diet: NPO  Pertinent medications: n/a  Goal of Therapy: Electrolytes WNL  Plan:  No replacement currently indicated Per dietary, monitor K, Phos, Mag daily until stable  Thank you for allowing pharmacy to be a part of this patient's care.  Anntonette Madewell A Maryanna Stuber, PharmD Clinical Pharmacist 11/30/2023 7:35 AM

## 2023-11-30 NOTE — Progress Notes (Signed)
 Rounding Note   Patient Name: Mada Sadik Date of Encounter: 11/30/2023  [facility] Cardiologist: [facility]  Subjective Sedation weaned off, increasingly alert, opening her eyes Remains intubated, minimal purposeful movement Nurses report no reported movement right lower extremity Unable to test upper extremities ICU team has met with family, changed to DNR status Telemetry reviewed, maintaining normal sinus rhythm Remains on amiodarone  infusion Off sedation, blood pressure running higher 170 systolic   Scheduled Meds:  Chlorhexidine  Gluconate Cloth  6 each Topical Daily   docusate  100 mg Per Tube BID   enoxaparin  (LOVENOX ) injection  40 mg Subcutaneous Q24H   free water  30 mL Per Tube Q4H   insulin  aspart  0-20 Units Subcutaneous Q4H   insulin  glargine-yfgn  15 Units Subcutaneous BID   ipratropium-albuterol   3 mL Nebulization TID   levothyroxine   112 mcg Per Tube Q0600   methylPREDNISolone  (SOLU-MEDROL ) injection  20 mg Intravenous Q12H   nutrition supplement (JUVEN)  1 packet Per Tube BID BM   mouth rinse  15 mL Mouth Rinse Q2H   pantoprazole  (PROTONIX ) IV  40 mg Intravenous QHS   polyethylene glycol  17 g Per Tube Daily   sodium chloride  flush  10-40 mL Intracatheter Q12H   thiamine  100 mg Per Tube Daily   Continuous Infusions:  amiodarone  30 mg/hr (11/30/23 0928)   feeding supplement (PIVOT 1.5 CAL) 50 mL/hr at 11/30/23 0928   norepinephrine (LEVOPHED) Adult infusion Stopped (11/30/23 0843)   piperacillin-tazobactam (ZOSYN)  IV 12.5 mL/hr at 11/30/23 0928   propofol  (DIPRIVAN ) infusion Stopped (11/30/23 0817)   PRN Meds: acetaminophen , docusate sodium , fentaNYL  (SUBLIMAZE ) injection, hydrALAZINE , mouth rinse, polyethylene glycol, sodium chloride  flush   Vital Signs  Vitals:   11/30/23 0800 11/30/23 0817 11/30/23 0936 11/30/23 0954  BP: (!) 130/45 (!) 134/51 (!) 196/75 (!) 175/66  Pulse: 71 78  87  Resp: (!) 21 (!) 21  (!) 23  Temp:   99.9 F (37.7 C)  100 F (37.8 C)  TempSrc:      SpO2: 100% 100%  100%  Weight:      Height:        Intake/Output Summary (Last 24 hours) at 11/30/2023 0955 Last data filed at 11/30/2023 9071 Gross per 24 hour  Intake 2517.92 ml  Output 3550 ml  Net -1032.08 ml      11/30/2023    3:28 AM 11/29/2023    4:10 AM 11/28/2023    5:00 AM  Last 3 Weights  Weight (lbs) 198 lb 3.1 oz 208 lb 12.4 oz 194 lb 10.7 oz  Weight (kg) 89.9 kg 94.7 kg 88.3 kg      Telemetry Normal sinus rhythm- Personally Reviewed  ECG   - Personally Reviewed  Physical Exam  GEN: Intubated Neck: No JVD Cardiac: RRR, no murmurs, rubs, or gallops.  Respiratory: Clear to auscultation bilaterally. GI: Soft, non-distended  MS: No edema; No deformity. Neuro: Unable to test Psych: Unable to test  Labs High Sensitivity Troponin:   Recent Labs  Lab 11/27/23 1816 11/27/23 2027 11/28/23 0053 11/28/23 0415 11/28/23 0804  TROPONINIHS 332* 379* 425* 360* 326*     Chemistry Recent Labs  Lab 11/27/23 1130 11/28/23 0415 11/29/23 0404 11/30/23 0404  NA 142 139 138 140  K 3.8 4.2 3.8 3.9  CL 100 104 100 100  CO2 19* 25 28 30   GLUCOSE 256* 207* 239* 260*  BUN 29* 35* 33* 25*  CREATININE 1.70* 1.28* 0.95 0.91  CALCIUM   8.5* 8.1* 8.0* 8.4*  MG  --   --  1.8 2.3  PROT 6.8  --   --   --   ALBUMIN 3.2* 2.7* 2.4* 2.7*  AST 127*  --   --   --   ALT 58*  --   --   --   ALKPHOS 71  --   --   --   BILITOT 0.8  --   --   --   GFRNONAA 31* 44* >60 >60  ANIONGAP 23* 10 10 10     Lipids  Recent Labs  Lab 11/29/23 0404  TRIG 261*    Hematology Recent Labs  Lab 11/28/23 0415 11/29/23 0404 11/30/23 0404  WBC 12.5* 9.3 10.8*  RBC 3.75* 3.37* 3.91  HGB 10.7* 9.7* 11.3*  HCT 35.9* 32.5* 36.7  MCV 95.7 96.4 93.9  MCH 28.5 28.8 28.9  MCHC 29.8* 29.8* 30.8  RDW 14.5 14.5 14.9  PLT 213 156 214   Thyroid   Recent Labs  Lab 11/27/23 1130  TSH 1.852  FREET4 1.20*    BNP Recent Labs  Lab  11/27/23 1013  BNP 258.4*    DDimer No results for input(s): DDIMER in the last 168 hours.   Radiology  DG Chest Port 1 View Result Date: 11/30/2023 EXAM: 1 VIEW XRAY OF THE CHEST 11/30/2023 04:35:00 AM COMPARISON: 11/28/2023 CLINICAL HISTORY: Acute respiratory failure with hypoxia and hypercarbia FINDINGS: LINES, TUBES AND DEVICES: Endotracheal tube in place with tip 1.5 cm above the carina. Stable right IJ central venous catheter. Enteric tube in place with tip in gastric fundus. LUNGS AND PLEURA: Low lung volumes. Small left pleural effusion. Similar bibasilar opacities. No pulmonary edema. No pneumothorax. HEART AND MEDIASTINUM: Aortic atherosclerosis. No acute abnormality of the cardiac and mediastinal silhouettes. BONES AND SOFT TISSUES: No acute osseous abnormality. IMPRESSION: 1. Low lung volumes with similar mild bibasilar opacities, which may reflect atelectasis or aspiration/pneumonia in the appropriate setting. 2. Small left pleural effusion. Electronically signed by: Waddell Calk MD 11/30/2023 05:08 AM EDT RP Workstation: [id number]    Cardiac Studies   Patient Profile   Anjel Perfetti is a 73 y.o. female with a hx of chronic hypoxic respiratory failure on 2 L, COPD, hypertension, anxiety, hypothyroidism presenting for altered mental status, cardiology consulted for atrial fibrillation with RVR   Assessment & Plan  Atrial fibrillation with RVR Onset morning November 28, 2023, not on anticoagulation heparin  infusion not given in the setting of multiple strokes with large territory - Was started on amiodarone  infusion, with repeat bolus 150 mg, dose of digoxin 0.25 IV x 1 , converting to normal sinus rhythm early afternoon - High risk of recurrent arrhythmia, we will continue amiodarone  infusion for now   Septic shock Acute on chronic respiratory failure, intubated Lactic acid 6.8 on arrival now trending down  Broad-spectrum antibiotics azithromycin  and Zosyn,  -No acute  findings on CT abdomen pelvis following recent hernia surgery -Blood pressure stable   Strokes Noted on CT scan and brain MRI -Possibly secondary to atrial fibrillation given multifocal ischemia left MCA territory, multiple small areas ischemia right hemisphere including right occipital lobe and right parietal lobe - Once cleared for anticoagulation from neurology, we can start heparin .  Does not appear they are following   Elevated troponin 300, peak 425 now trending downward In the setting of hypoxia, hypotension, tachycardia, strokes -Not a candidate for ischemic workup at this time   Acute renal failure Creatinine 1.7 on arrival, improving on recheck -  Likely ATN, now down to her baseline   For questions or updates, please contact [facility] Please consult [email or web address] for contact info under     Signed, Dariane Natzke, MD  11/30/2023, 9:55 AM

## 2023-11-30 NOTE — Progress Notes (Signed)
 NAME:  [name], MRN:  [id number], DOB:  07/26/1950, LOS: 3 ADMISSION DATE:  11/27/2023, CONSULTATION DATE:  11/27/2023 REFERRING MD:  Dr. Willo, CHIEF COMPLAINT:  Unresponsive, hypoxia    Brief Pt Description / Synopsis:  73 year old female with past medical history significant for COPD on 2 L supplemental oxygen , hypothyroidism and recent robotic assisted umbilical hernia repair on 11/25/23, who is admitted with acute metabolic encephalopathy and acute on chronic hypoxic and hypercapnic respiratory failure, sepsis due to suspected aspiration pneumonia, and acute kidney injury requiring intubation and mechanical ventilation.  Found to have acute to subacute large left MCA infarcts, along with multiple small areas of acute/subacute infarcts in the right occipital lobe and right parietal lobes.   History of Present Illness:  [name] is a 73 year old female with a past medical history significant for COPD, chronic hypoxic respiratory failure requiring 2 L supplemental oxygen , hypothyroidism, hypertension, and anxiety who presents to [facility] ED on 11/27/2023 for evaluation of altered mental status.  Patient is currently altered and on the ventilator and unable to contribute to history, and no family currently available, therefore history is obtained from chart review.  It is reported that she was found down and minimally responsive by family earlier this morning.  She underwent robotic assisted umbilical hernia repair surgery on 11/25/2023 by Dr Marinda and was discharged home yesterday 10/21.  Her last known well time was approximately 8:00 PM last night night prior to bedtime, where her son talked to her over the telephone and said she sounded fine.  On his way home from work this morning, he attempted to call her of which she did not answer.  He went by her house to check on her and she did not come to the door.   Upon EMS arrival she was noted to be hypoxic with sats in the 50s on room air which  she was subsequent placed on nonrebreather with improvement of saturations to the 70s.  There was also concern for possible tongue swelling which she was given IM epinephrine  by EMS.  On arrival to the ED she remained unresponsive  and somnolent, with gurgling respirations and with significant thick white secretions in the posterior oropharynx.  She required emergent intubation by ED provider.  ED provider did not appreciate any significant tongue/lip, or airway edema to suggest angioedema or anaphylaxis.   ED Course: Initial Vital Signs: Temperature 94.7 F, pulse 93, respiratory rate 16, blood pressure 84/38, SpO2 100% via ventilator Significant Labs: BNP 258, high-sensitivity troponin 48, WBC 17.6, lactic acid 6.8, TSH 1.8, bicarbonate 19, anion gap 23, BUN 29, creatinine 1.7, AST 127, ALT 58, glucose 256 VBG: pH 7.1/pCO2 81/pO2 74/bicarb 25.1 Urinalysis negative for UTI Urine drug screen positive for benzodiazepines Imaging Chest X-ray>>IMPRESSION: 1. Patchy airspace opacities in the left lung base, possibly atelectasis or aspiration. Trace left pleural effusion. 2. Well-positioned endotracheal tube. CT Head>>IMPRESSION: 1. Acute to early subacute left MCA infarcts. No hemorrhage. CT Abdomen & Pelvis wo contrast>>IMPRESSION: Apparent abdominal wall wound to the left of midline with underlying subcutaneous air extending laterally in the left abdominal wall. Question recent surgical procedure. If no recent procedure, this would be concerning for possible infection. No drainable focal fluid collection. Colonic diverticulosis.  No active diverticulitis. Stable 4.5 cm left ovarian cyst. Aortic atherosclerosis. Trace bilateral pleural effusions with bilateral lower lobe airway thickening and lower lobe opacities which could reflect atelectasis or developing bronchopneumonia. Medications Administered: 2 L IV fluid boluses, peripheral Levophed started, IV vancomycin  and Zosyn  PCCM asked to  admit for further workup and treatment.  Please see Significant Hospital Events section below for full detailed hospital course.   Pertinent  Medical History   Past Medical History:  Diagnosis Date   COPD (chronic obstructive pulmonary disease) (HCC)     Micro Data:  10/22: Blood cultures x 2>>no growth to date  10/22: COVID/flu/RSV PCR>> negative 10/22: Respiratory viral panel>> negative 10/22: Strep pneumo urinary antigen>>negative  10/22: Legionella urinary antigen>> 10/22: MRSA PCR>> negative  10/22: Tracheal aspirate>>  Antimicrobials:   Anti-infectives (From admission, onward)    Start     Dose/Rate Route Frequency Ordered Stop   11/27/23 1600  piperacillin-tazobactam (ZOSYN) IVPB 3.375 g        3.375 g 12.5 mL/hr over 240 Minutes Intravenous Every 8 hours 11/27/23 1447     11/27/23 1315  azithromycin  (ZITHROMAX ) 500 mg in sodium chloride  0.9 % 250 mL IVPB        500 mg 250 mL/hr over 60 Minutes Intravenous Every 24 hours 11/27/23 1308 11/29/23 1320   11/27/23 1215  piperacillin-tazobactam (ZOSYN) IVPB 3.375 g        3.375 g 100 mL/hr over 30 Minutes Intravenous  Once 11/27/23 1212 11/27/23 1312   11/27/23 1215  vancomycin (VANCOCIN) IVPB 1000 mg/200 mL premix        1,000 mg 200 mL/hr over 60 Minutes Intravenous  Once 11/27/23 1212 11/27/23 2302   11/27/23 1200  Ampicillin-Sulbactam (UNASYN) 3 g in sodium chloride  0.9 % 100 mL IVPB  Status:  Discontinued        3 g 200 mL/hr over 30 Minutes Intravenous  Once 11/27/23 1155 11/27/23 1212       Significant Hospital Events: Including procedures, antibiotic start and stop dates in addition to other pertinent events   10/22: Presented to ED unresponsive and hypoxic requiring emergent intubation by ED provider.  Postintubation with hypotension requiring IV fluids and initiation of peripheral Levophed.  PCCM asked to admit.  CT Head concerning for acute to subacute Left MCA infarcts, Neurology consulted.  10/23: MRI  overnight showing large areas of multifocal acute/early subacute ischemia within the left MCA territory, along with multiple small areas of acute/early subacute ischemia in the right hemisphere including the right occipital and parietal lobes. Levophed weaned down with addition of vasopressin.  Remains on vent. Renal function improving, UOP 1L last 24 hrs (net +1.1 L).  Developed A.fib with RVR requiring Amiodarone  bolus and infusion, Cardiology consulted.  10/24: No significant events overnight. Low grade temperature this morning (T max 100.5), Leukocytosis has resolved, but with increase in secretions from ETT, will obtain Tracheal aspirate.  Converted back to NSR on Amiodarone  gtt, remains on Levophed and Vasopressin.  On minimal vent support, WUA & SBT as tolerated. AKI continues to improve, UOP 1.7 L last 24 hrs (net + 1.7 L). 10/25 unable to wean from vent, unable to protect airway  Interim History / Subjective:  Remains critically ill Remains intubated Requires VENT support for survival Unable to protect her airway Significant brain damage from strokes  Objective   Blood pressure (!) 131/46, pulse 77, temperature 100 F (37.8 C), temperature source Bladder, resp. rate 16, height 5' 3 (1.6 m), weight 89.9 kg, SpO2 92%.    Vent Mode: PRVC FiO2 (%):  [30 %] 30 % Set Rate:  [24 bmp] 24 bmp Vt Set:  [400 mL] 400 mL PEEP:  [5 cmH20-8 cmH20] 5 cmH20 Pressure Support:  [5 cmH20] 5  cmH20 Plateau Pressure:  [15 cmH20] 15 cmH20   Intake/Output Summary (Last 24 hours) at 11/30/2023 0747 Last data filed at 11/30/2023 0744 Gross per 24 hour  Intake 2733.8 ml  Output 2900 ml  Net -166.2 ml   Filed Weights   11/28/23 0500 11/29/23 0410 11/30/23 0328  Weight: 88.3 kg 94.7 kg 89.9 kg     REVIEW OF SYSTEMS  PATIENT IS UNABLE TO PROVIDE COMPLETE REVIEW OF SYSTEMS DUE TO SEVERE CRITICAL ILLNESS   PHYSICAL EXAMINATION:  GENERAL:critically ill appearing EYES: Pupils equal, round, reactive  to light.  No scleral icterus.  MOUTH: Moist mucosal membrane. INTUBATED NECK: Supple.  PULMONARY: Lungs clear to auscultation CARDIOVASCULAR: S1 and S2.  Regular rate and rhythm GASTROINTESTINAL: Soft, nontender, -distended. Positive bowel sounds.  MUSCULOSKELETAL:  edema.  NEUROLOGIC: sedated SKIN:normal, warm to touch, Capillary refill delayed  Pulses present bilaterally     Assessment & Plan:  72 year old morbidly obese white female history of smoking probable underlying severe sleep apnea admitted to the ICU for acute and severe encephalopathy secondary to massive left-sided MCA infarcts leading to inability to protect airway severe respiratory failure ventilatory support signs symptoms of severe brain damage, family discussion patient has DNR.  Plan for one-way extubation at some point  Severe ACUTE Hypoxic and Hypercapnic Respiratory Failure -continue Mechanical Ventilator support -Wean Fio2 and PEEP as tolerated -VAP/VENT bundle implementation - Wean PEEP & FiO2 as tolerated, maintain SpO2 > 88% - Head of bed elevated 30 degrees, VAP protocol in place - Plateau pressures less than 30 cm H20  - Intermittent chest x-ray & ABG PRN - Ensure adequate pulmonary hygiene  -will perform SAT/SBT when respiratory parameters are met    Acute Metabolic Encephalopathy Acute to Subacute large Left MCA Infarcts  CO2 Narcosis Sedation needs in setting of mechanical ventilation PMHx: Depression, anxiety  MRI Brain 10/23: Large areas of multifocal acute/subacute ischemia within the left MCA and multiple small areas of acute/subacute ischemia in the right hemisphere including the right occipital lobe and right parietal lobe  -UDS + for benzodiazepines  -Treatment of metabolic derangements and Hypercapnia as outlined above  -Maintain a RASS goal of 0 to -1 -Fentanyl  and Propofol  prn to maintain RASS goal -Avoid sedating medications as able -Daily wake up assessment -TSH normal at  1.8 -Neurology following, appreciate input      Cardiogenic shock A-fib with RVR Demand ischemia Aspirin  therapy I do not recommend anticoagulation therapy at this time Currently on amiodarone  infusion -Echocardiogram 11/27/23: LVEF 60-65%, Grade I DD, RV systolic function normal, RV size normal, normal pulmonary artery systolic pressure -Continuous cardiac monitoring -Maintain MAP >65 -Trend lactic acid until normalized -HS Troponin peaked at 425 -Continue Amiodarone  infusion -Cardiology consulted, appreciate input ~ will hold off on anticoagulation for now given risk of hemorrhagic conversion of strokes ~ will defer to Neurology on timing for anticoagulation/antiplatelets    COPD with pulmonary infection Sputum culture pending Continue antibiotics Start steroids  recent robotic assisted laparoscopic umbilical hernia repair on 11/25/23 -Monitor fever curve -Trend WBC's & Procalcitonin -Follow cultures as above -Continue empiric Azithromycin  & Zosyn pending cultures & sensitivities -CT Abdomen & Pelvis reviewed by Dr. Marinda ~ felt nothing acute on imaging to explain her decompensation   ACUTE KIDNEY INJURY/Renal Failure -continue Foley Catheter-assess need -Avoid nephrotoxic agents -Follow urine output, BMP -Ensure adequate renal perfusion, optimize oxygenation -Renal dose medications   Intake/Output Summary (Last 24 hours) at 11/30/2023 0751 Last data filed at 11/30/2023 0744 Gross per 24 hour  Intake 2733.8 ml  Output 3850 ml  Net -1116.2 ml     ENDO - ICU hypoglycemic\Hyperglycemia protocol -check FSBS per protocol   GI GI PROPHYLAXIS as indicated NUTRITIONAL STATUS DIET-->TF's as tolerated Constipation protocol as indicated   ELECTROLYTES -follow labs as needed -replace as needed -pharmacy consultation and following  RESTRICTIVE TRANSFUSION PROTOCOL TRANSFUSION  IF HGB<7  or ACTIVE BLEEDING OR DX of ACUTE CORONARY SYNDROMES        Best  Practice (right click and Reselect all SmartList Selections daily)   Diet/type: NPO, tube feeds  DVT prophylaxis: Lovenox  GI prophylaxis: PPI Lines: central line and is still needed Foley:  yes, and still needed Code Status:  DNR Last date of multidisciplinary goals of care discussion [10/24]  10/ 24: Updated pt's sons at bedside on plan of care.   Labs   CBC: Recent Labs  Lab 11/27/23 1013 11/28/23 0415 11/29/23 0404 11/30/23 0404  WBC 17.6* 12.5* 9.3 10.8*  NEUTROABS 12.7*  --   --   --   HGB 12.3 10.7* 9.7* 11.3*  HCT 42.8 35.9* 32.5* 36.7  MCV 100.0 95.7 96.4 93.9  PLT 333 213 156 214    Basic Metabolic Panel: Recent Labs  Lab 11/27/23 1130 11/28/23 0415 11/29/23 0404 11/30/23 0404  NA 142 139 138 140  K 3.8 4.2 3.8 3.9  CL 100 104 100 100  CO2 19* 25 28 30   GLUCOSE 256* 207* 239* 260*  BUN 29* 35* 33* 25*  CREATININE 1.70* 1.28* 0.95 0.91  CALCIUM  8.5* 8.1* 8.0* 8.4*  MG  --   --  1.8 2.3  PHOS  --  2.4* 1.7* 2.5   GFR: Estimated Creatinine Clearance: 57.7 mL/min (by C-G formula based on SCr of 0.91 mg/dL). Recent Labs  Lab 11/27/23 1013 11/27/23 1118 11/27/23 1246 11/27/23 1816 11/27/23 2028 11/28/23 0053 11/28/23 0415 11/29/23 0403 11/29/23 0404 11/30/23 0404  PROCALCITON  --   --   --  0.74  --   --   --  0.42  --   --   WBC 17.6*  --   --   --   --   --  12.5*  --  9.3 10.8*  LATICACIDVEN  --    < > 6.0* 3.1* 3.0* 2.4*  --   --   --   --    < > = values in this interval not displayed.    Liver Function Tests: Recent Labs  Lab 11/27/23 1130 11/28/23 0415 11/29/23 0404 11/30/23 0404  AST 127*  --   --   --   ALT 58*  --   --   --   ALKPHOS 71  --   --   --   BILITOT 0.8  --   --   --   PROT 6.8  --   --   --   ALBUMIN 3.2* 2.7* 2.4* 2.7*   No results for input(s): LIPASE, AMYLASE in the last 168 hours. No results for input(s): AMMONIA in the last 168 hours.  ABG    Component Value Date/Time   PHART 7.37 11/28/2023  0650   PCO2ART 52 (H) 11/28/2023 0650   PO2ART 77 (L) 11/28/2023 0650   HCO3 30.1 (H) 11/28/2023 0650   ACIDBASEDEF 1.4 11/27/2023 2023   O2SAT 98.4 11/28/2023 0650     Coagulation Profile: No results for input(s): INR, PROTIME in the last 168 hours.  Cardiac Enzymes: No results for input(s): CKTOTAL, CKMB, CKMBINDEX, TROPONINI in the last  168 hours.  HbA1C: Hgb A1c MFr Bld  Date/Time Value Ref Range Status  11/27/2023 08:27 PM 6.8 (H) 4.8 - 5.6 % Final    Comment:    (NOTE) Diagnosis of Diabetes The following HbA1c ranges recommended by the American Diabetes Association (ADA) may be used as an aid in the diagnosis of diabetes mellitus.  Hemoglobin             Suggested A1C NGSP%              Diagnosis  <5.7                   Non Diabetic  5.7-6.4                Pre-Diabetic  >6.4                   Diabetic  <7.0                   Glycemic control for                       adults with diabetes.      CBG: Recent Labs  Lab 11/29/23 1117 11/29/23 1551 11/29/23 1926 11/29/23 2320 11/30/23 0323  GLUCAP 117* 98 182* 183* 245*     DVT/GI PRX  assessed I Assessed the need for Labs I Assessed the need for Foley I Assessed the need for Central Venous Line Family Discussion when available I Assessed the need for Mobilization I made an Assessment of medications to be adjusted accordingly Safety Risk assessment completed  CASE DISCUSSED IN MULTIDISCIPLINARY ROUNDS WITH ICU TEAM     Critical Care Time devoted to patient care services described in this note is 65 minutes.  Critical care was necessary to treat /prevent imminent and life-threatening deterioration. Overall, patient is critically ill, prognosis is guarded.  Patient with Multiorgan failure and at high risk for cardiac arrest and death.    Nickolas Alm Cellar, M.D.  [facility]  Medical Director [facility]

## 2023-11-30 NOTE — Progress Notes (Addendum)
 Palliative Care Progress Note, Assessment & Plan   Patient Name: [name]       Date: 11/30/2023 DOB: 1950/09/29  Age: 73 y.o. MRN#: [id number] Attending Physician: Isaiah Scrivener, MD Primary Care Physician: Antonette Angeline ORN, NP Admit Date: 11/27/2023  Subjective: Unable to assess  HPI: Per previous HPI: [name] is a 73 year old female with a past medical history significant for COPD, chronic hypoxic respiratory failure requiring 2 L supplemental oxygen , hypothyroidism, hypertension, and anxiety who presents to [facility] ED on 11/27/2023 for evaluation of altered mental status.  Patient is currently altered and on the ventilator and unable to contribute to history, and no family currently available, therefore history is obtained from chart.   Palliative care was consulted for assistance with goals of care conversation.  Summary of counseling/coordination of care: Extensive chart review completed prior to meeting patient including labs, vital signs, imaging, progress notes, orders, and available advanced directive documents from current and previous encounters.     Latest Ref Rng & Units 11/30/2023    4:04 AM 11/29/2023    4:04 AM 11/28/2023    4:15 AM  CBC  WBC 4.0 - 10.5 K/uL 10.8  9.3  12.5   Hemoglobin 12.0 - 15.0 g/dL 88.6  9.7  89.2   Hematocrit 36.0 - 46.0 % 36.7  32.5  35.9   Platelets 150 - 400 K/uL 214  156  213       Latest Ref Rng & Units 11/30/2023    4:04 AM 11/29/2023    4:04 AM 11/28/2023    4:15 AM  CMP  Glucose 70 - 99 mg/dL 739  760  792   BUN 8 - 23 mg/dL 25  33  35   Creatinine 0.44 - 1.00 mg/dL 9.08  9.04  8.71   Sodium 135 - 145 mmol/L 140  138  139   Potassium 3.5 - 5.1 mmol/L 3.9  3.8  4.2   Chloride 98 - 111 mmol/L 100  100  104   CO2 22 - 32 mmol/L 30  28  25     Calcium  8.9 - 10.3 mg/dL 8.4  8.0  8.1      After reviewing the patient's chart I assessed patient at bedside and spoke with family in regards to symptom management and goals of care.  Ill-appearing, elderly female lying in bed sedated and mechanically ventilated.  She has fixed gaze and does not respond to verbal or tactile stimuli.  Patient requiring her support.  No acute distress noted.  [name] (son), Shona (daughter-in-law), [name] (son) and [name] (daughter-in-law) at bedside during visit.  [name] and [name] both acknowledge due to significant brain damage from stroke and inability to live without ventilator support they are looking at their mother's end of life.  [name] and [name] both state they know it is their decision when to withdraw medical support but are not ready to make that decision at this time.  They share this event has been a shock to their family.  They add patient has multiple family members and church family members coming to visit and pray with her.   Notified that palliative care is available for support and questions if needed.  Therapeutic silence and active listening provided for family to share their thoughts and emotions regarding current medical situation.  Emotional support provided.  Physical Exam Vitals reviewed.  Constitutional:      General: She is not in acute distress.    Appearance: She is obese. She is ill-appearing.  HENT:     Nose:     Comments: NG with tube feeds    Mouth/Throat:     Mouth: Mucous membranes are dry.  Pulmonary:     Effort: No respiratory distress.     Comments: Mechanically ventilated and sedated             Recommendations/Plan: DNR with interventions Family wants to allow time before making decision regarding withdrawing care Extremely poor prognosis Anticipate hospital death  Total Time 50 minutes   Time spent includes: Detailed review of medical records (labs, imaging, vital signs), medically appropriate  exam (mental status, respiratory, cardiac, skin), discussed with treatment team, counseling and educating patient, family and staff, documenting clinical information, medication management and coordination of care.     Devere Sacks, ELNITA- [facility] Palliative Medicine Team  11/30/2023 10:13 AM  Office [phone]  Pager [phone]

## 2023-12-01 DIAGNOSIS — R4182 Altered mental status, unspecified: Secondary | ICD-10-CM | POA: Diagnosis not present

## 2023-12-01 DIAGNOSIS — Z66 Do not resuscitate: Secondary | ICD-10-CM | POA: Diagnosis not present

## 2023-12-01 DIAGNOSIS — J9601 Acute respiratory failure with hypoxia: Secondary | ICD-10-CM | POA: Diagnosis not present

## 2023-12-01 DIAGNOSIS — Z789 Other specified health status: Secondary | ICD-10-CM | POA: Diagnosis not present

## 2023-12-01 DIAGNOSIS — J9602 Acute respiratory failure with hypercapnia: Secondary | ICD-10-CM | POA: Diagnosis not present

## 2023-12-01 DIAGNOSIS — Z515 Encounter for palliative care: Secondary | ICD-10-CM | POA: Diagnosis not present

## 2023-12-01 DIAGNOSIS — R570 Cardiogenic shock: Secondary | ICD-10-CM | POA: Diagnosis not present

## 2023-12-01 LAB — CBC
HCT: 37 % (ref 36.0–46.0)
Hemoglobin: 11.5 g/dL — ABNORMAL LOW (ref 12.0–15.0)
MCH: 28.8 pg (ref 26.0–34.0)
MCHC: 31.1 g/dL (ref 30.0–36.0)
MCV: 92.5 fL (ref 80.0–100.0)
Platelets: 285 K/uL (ref 150–400)
RBC: 4 MIL/uL (ref 3.87–5.11)
RDW: 15.2 % (ref 11.5–15.5)
WBC: 12.1 K/uL — ABNORMAL HIGH (ref 4.0–10.5)
nRBC: 0 % (ref 0.0–0.2)

## 2023-12-01 LAB — RENAL FUNCTION PANEL
Albumin: 2.8 g/dL — ABNORMAL LOW (ref 3.5–5.0)
Anion gap: 8 (ref 5–15)
BUN: 46 mg/dL — ABNORMAL HIGH (ref 8–23)
CO2: 31 mmol/L (ref 22–32)
Calcium: 8.7 mg/dL — ABNORMAL LOW (ref 8.9–10.3)
Chloride: 100 mmol/L (ref 98–111)
Creatinine, Ser: 1.01 mg/dL — ABNORMAL HIGH (ref 0.44–1.00)
GFR, Estimated: 58 mL/min — ABNORMAL LOW (ref >60)
Glucose, Bld: 224 mg/dL — ABNORMAL HIGH (ref 70–99)
Phosphorus: 2.5 mg/dL (ref 2.5–4.6)
Potassium: 4.2 mmol/L (ref 3.5–5.1)
Sodium: 139 mmol/L (ref 135–145)

## 2023-12-01 LAB — GLUCOSE, CAPILLARY
Glucose-Capillary: 183 mg/dL — ABNORMAL HIGH (ref 70–99)
Glucose-Capillary: 251 mg/dL — ABNORMAL HIGH (ref 70–99)
Glucose-Capillary: 131 mg/dL — ABNORMAL HIGH (ref 70–99)

## 2023-12-01 LAB — MAGNESIUM: Magnesium: 2.4 mg/dL (ref 1.7–2.4)

## 2023-12-01 MED ORDER — MIDAZOLAM HCL 2 MG/2ML IJ SOLN
INTRAMUSCULAR | Status: AC
  Filled 2023-12-01: qty 4

## 2023-12-01 MED ORDER — GLYCOPYRROLATE 0.2 MG/ML IJ SOLN
0.2000 mg | INTRAMUSCULAR | Status: DC | PRN
  Administered 2023-12-01 (×2): 0.2 mg via INTRAVENOUS
  Filled 2023-12-01: qty 1

## 2023-12-01 MED ORDER — MORPHINE 100MG IN NS 100ML (1MG/ML) PREMIX INFUSION
0.0000 mg/h | INTRAVENOUS | Status: DC
  Administered 2023-12-01 (×2): 5 mg/h via INTRAVENOUS
  Filled 2023-12-01: qty 100

## 2023-12-01 MED ORDER — POLYVINYL ALCOHOL 1.4 % OP SOLN: 1.0000 [drp] | Freq: Four times a day (QID) | OPHTHALMIC | Status: DC | PRN

## 2023-12-01 MED ORDER — MIDAZOLAM HCL (PF) 2 MG/2ML IJ SOLN
2.0000 mg | INTRAMUSCULAR | Status: DC | PRN
  Administered 2023-12-01 (×4): 2 mg via INTRAVENOUS
  Filled 2023-12-01 (×2): qty 2

## 2023-12-01 MED ORDER — GLYCOPYRROLATE 0.2 MG/ML IJ SOLN: 0.2000 mg | INTRAMUSCULAR | Status: DC | PRN

## 2023-12-01 MED ORDER — ACETAMINOPHEN 650 MG RE SUPP: 650.0000 mg | Freq: Four times a day (QID) | RECTAL | Status: DC | PRN

## 2023-12-01 MED ORDER — ACETAMINOPHEN 325 MG PO TABS: 650.0000 mg | ORAL_TABLET | Freq: Four times a day (QID) | ORAL | Status: DC | PRN

## 2023-12-01 MED ORDER — MORPHINE BOLUS VIA INFUSION
5.0000 mg | INTRAVENOUS | Status: DC | PRN
  Administered 2023-12-01 (×10): 5 mg via INTRAVENOUS

## 2023-12-01 MED ORDER — GLYCOPYRROLATE 0.2 MG/ML IJ SOLN: 0.1000 mg | Freq: Four times a day (QID) | INTRAMUSCULAR | Status: DC | PRN

## 2023-12-01 MED ORDER — GLYCOPYRROLATE 1 MG PO TABS: 1.0000 mg | ORAL_TABLET | ORAL | Status: DC | PRN

## 2023-12-01 MED ORDER — MIDAZOLAM HCL (PF) 2 MG/2ML IJ SOLN
2.0000 mg | INTRAMUSCULAR | Status: DC | PRN
  Administered 2023-12-01 (×2): 4 mg via INTRAVENOUS

## 2023-12-01 MED ORDER — SODIUM CHLORIDE 0.9 % IV SOLN: INTRAVENOUS | Status: DC

## 2023-12-02 ENCOUNTER — Encounter: Payer: Self-pay | Admitting: General Surgery

## 2023-12-02 LAB — CULTURE, BLOOD (ROUTINE X 2)
Culture: NO GROWTH
Culture: NO GROWTH
Special Requests: ADEQUATE

## 2023-12-02 NOTE — Progress Notes (Signed)
 Family has decided to do compassionate one-way extubation. Neurology will sign off but please re-engage if we can be of further assistance.  Elida Ross, MD [facility] [phone]  If 7pm- 7am, please page neurology on call as listed in AMION.

## 2023-12-03 LAB — CULTURE, RESPIRATORY W GRAM STAIN: Gram Stain: NONE SEEN

## 2023-12-05 ENCOUNTER — Encounter: Admitting: General Surgery

## 2023-12-06 ENCOUNTER — Encounter: Admitting: Internal Medicine

## 2023-12-07 NOTE — Progress Notes (Addendum)
 Pt transitioned to comfort care. Morphine  gtt initiated and extubated to comfort. Family at bedside. PRNs as needed for comfort.

## 2023-12-07 NOTE — Progress Notes (Signed)
 PHARMACY CONSULT NOTE - ELECTROLYTES  Pharmacy Consult for Electrolyte Monitoring and Replacement   Recent Labs: Height: 5' 3 (160 cm) Weight: 87.5 kg (192 lb 14.4 oz) IBW/kg (Calculated) : 52.4 Estimated Creatinine Clearance: 51.2 mL/min (A) (by C-G formula based on SCr of 1.01 mg/dL (H)). Potassium (mmol/L)  Date Value  December 24, 2023 4.2   Magnesium  (mg/dL)  Date Value  89/73/7974 2.4   Calcium  (mg/dL)  Date Value  89/73/7974 8.7 (L)   Albumin (g/dL)  Date Value  89/73/7974 2.8 (L)   Phosphorus (mg/dL)  Date Value  89/73/7974 2.5   Sodium (mmol/L)  Date Value  2023-12-24 139   Corrected Ca: 9.3 mg/dL  Assessment  [name] is a 73 y.o. female presenting with acute on chronic hypoxic and hypercapnic respiratory failure, AMS, and AKI. PMH significant for COPD, chronic hypoxic respiratory failure, hypothyroidism, HTN, anxiety. Pharmacy has been consulted to monitor and replace electrolytes.  Patient high-risk for refeeding.  Diet: NPO  Pertinent medications: n/a  Goal of Therapy: Electrolytes WNL  Plan:  No replacement currently indicated Per dietary, monitor K, Phos, Mag daily until stable  Thank you for allowing pharmacy to be a part of this patient's care.  Latresa Gasser A Nayla Dias, PharmD Clinical Pharmacist 12-24-2023 7:39 AM

## 2023-12-07 NOTE — Progress Notes (Signed)
 NAME:  Quinnetta Roepke, MRN:  [id number], DOB:  1950/06/11, LOS: 4 ADMISSION DATE:  11/27/2023, CONSULTATION DATE:  11/27/2023 REFERRING MD:  Dr. Willo, CHIEF COMPLAINT:  Unresponsive, hypoxia    Brief Pt Description / Synopsis:  73 year old female with past medical history significant for COPD on 2 L supplemental oxygen , hypothyroidism and recent robotic assisted umbilical hernia repair on 11/25/23, who is admitted with acute metabolic encephalopathy and acute on chronic hypoxic and hypercapnic respiratory failure, sepsis due to suspected aspiration pneumonia, and acute kidney injury requiring intubation and mechanical ventilation.  Found to have acute to subacute large left MCA infarcts, along with multiple small areas of acute/subacute infarcts in the right occipital lobe and right parietal lobes.   History of Present Illness:  Anora Schwenke is a 73 year old female with a past medical history significant for COPD, chronic hypoxic respiratory failure requiring 2 L supplemental oxygen , hypothyroidism, hypertension, and anxiety who presents to [facility] ED on 11/27/2023 for evaluation of altered mental status.  Patient is currently altered and on the ventilator and unable to contribute to history, and no family currently available, therefore history is obtained from chart review.  It is reported that she was found down and minimally responsive by family earlier this morning.  She underwent robotic assisted umbilical hernia repair surgery on 11/25/2023 by Dr Marinda and was discharged home yesterday 10/21.  Her last known well time was approximately 8:00 PM last night night prior to bedtime, where her son talked to her over the telephone and said she sounded fine.  On his way home from work this morning, he attempted to call her of which she did not answer.  He went by her house to check on her and she did not come to the door.   Upon EMS arrival she was noted to be hypoxic with sats in the 50s on room air which  she was subsequent placed on nonrebreather with improvement of saturations to the 70s.  There was also concern for possible tongue swelling which she was given IM epinephrine  by EMS.  On arrival to the ED she remained unresponsive  and somnolent, with gurgling respirations and with significant thick white secretions in the posterior oropharynx.  She required emergent intubation by ED provider.  ED provider did not appreciate any significant tongue/lip, or airway edema to suggest angioedema or anaphylaxis.   Imaging Chest X-ray>>IMPRESSION: 1. Patchy airspace opacities in the left lung base, possibly atelectasis or aspiration. Trace left pleural effusion. 2. Well-positioned endotracheal tube. CT Head>>IMPRESSION: 1. Acute to early subacute left MCA infarcts. No hemorrhage.  PCCM asked to admit for further workup and treatment.   Pertinent  Medical History   Past Medical History:  Diagnosis Date   COPD (chronic obstructive pulmonary disease) (HCC)     Micro Data:  10/22: Blood cultures x 2>>no growth to date  10/22: COVID/flu/RSV PCR>> negative 10/22: Respiratory viral panel>> negative 10/22: Strep pneumo urinary antigen>>negative  10/22: Legionella urinary antigen>>NEG 10/22: MRSA PCR>> negative  10/22: Tracheal aspirate>>NEG  Antimicrobials:   Anti-infectives (From admission, onward)    Start     Dose/Rate Route Frequency Ordered Stop   11/27/23 1600  piperacillin-tazobactam (ZOSYN) IVPB 3.375 g        3.375 g 12.5 mL/hr over 240 Minutes Intravenous Every 8 hours 11/27/23 1447     11/27/23 1315  azithromycin  (ZITHROMAX ) 500 mg in sodium chloride  0.9 % 250 mL IVPB        500 mg 250 mL/hr over 60  Minutes Intravenous Every 24 hours 11/27/23 1308 11/29/23 1320   11/27/23 1215  piperacillin-tazobactam (ZOSYN) IVPB 3.375 g        3.375 g 100 mL/hr over 30 Minutes Intravenous  Once 11/27/23 1212 11/27/23 1312   11/27/23 1215  vancomycin (VANCOCIN) IVPB 1000 mg/200 mL premix         1,000 mg 200 mL/hr over 60 Minutes Intravenous  Once 11/27/23 1212 11/27/23 2302   11/27/23 1200  Ampicillin-Sulbactam (UNASYN) 3 g in sodium chloride  0.9 % 100 mL IVPB  Status:  Discontinued        3 g 200 mL/hr over 30 Minutes Intravenous  Once 11/27/23 1155 11/27/23 1212       Significant Hospital Events: Including procedures, antibiotic start and stop dates in addition to other pertinent events   10/22: Presented to ED unresponsive and hypoxic requiring emergent intubation by ED provider.  Postintubation with hypotension requiring IV fluids and initiation of peripheral Levophed.  PCCM asked to admit.  CT Head concerning for acute to subacute Left MCA infarcts, Neurology consulted.  10/23: MRI overnight showing large areas of multifocal acute/early subacute ischemia within the left MCA territory, along with multiple small areas of acute/early subacute ischemia in the right hemisphere including the right occipital and parietal lobes. Levophed weaned down with addition of vasopressin.  Remains on vent. Renal function improving, UOP 1L last 24 hrs (net +1.1 L).  Developed A.fib with RVR requiring Amiodarone  bolus and infusion, Cardiology consulted.  10/24: No significant events overnight. Low grade temperature this morning (T max 100.5), Leukocytosis has resolved, but with increase in secretions from ETT, will obtain Tracheal aspirate.  Converted back to NSR on Amiodarone  gtt, remains on Levophed and Vasopressin.  On minimal vent support, WUA & SBT as tolerated. AKI continues to improve, UOP 1.7 L last 24 hrs (net + 1.7 L). 10/25 unable to wean from vent, unable to protect airway, failed weaning trial, family updated       Patient remains DNR, patient unable to protect airway 2023-12-21 plan for one way extubation  Interim History / Subjective:  Remains critically ill Remains intubated Requires VENT support for survival Unable to protect her airway Significant brain damage from  strokes  Objective   Blood pressure (!) 132/46, pulse (!) 58, temperature 99.3 F (37.4 C), temperature source Bladder, resp. rate (!) 24, height 5' 3 (1.6 m), weight 87.5 kg, SpO2 99%.    Vent Mode: PRVC FiO2 (%):  [30 %] 30 % Set Rate:  [24 bmp] 24 bmp Vt Set:  [400 mL] 400 mL PEEP:  [5 cmH20] 5 cmH20 Plateau Pressure:  [12 cmH20] 12 cmH20   Intake/Output Summary (Last 24 hours) at 21-Dec-2023 0803 Last data filed at 12/21/23 9262 Gross per 24 hour  Intake 2396.19 ml  Output 2375 ml  Net 21.19 ml   Filed Weights   11/29/23 0410 11/30/23 0328 12/21/23 0315  Weight: 94.7 kg 89.9 kg 87.5 kg      REVIEW OF SYSTEMS  PATIENT IS UNABLE TO PROVIDE COMPLETE REVIEW OF SYSTEMS DUE TO SEVERE CRITICAL ILLNESS   PHYSICAL EXAMINATION:  GENERAL:critically ill appearing, +resp distress EYES: Pupils equal, round, reactive to light.  No scleral icterus.  MOUTH: Moist mucosal membrane. INTUBATED NECK: Supple.  PULMONARY: Lungs clear to auscultation CARDIOVASCULAR: S1 and S2.  Regular rate and rhythm GASTROINTESTINAL: Soft, nontender, -distended. Positive bowel sounds.  MUSCULOSKELETAL: No swelling, clubbing, or edema.  NEUROLOGIC: obtunded,sedated SKIN:normal, warm to touch, Capillary refill delayed  Pulses present  bilaterally   Assessment & Plan:  73 year old morbidly obese white female history of smoking probable underlying severe sleep apnea admitted to the ICU for acute and severe encephalopathy secondary to massive left-sided MCA infarcts leading to inability to protect airway severe respiratory failure ventilatory support signs symptoms of severe brain damage, family discussion patient has DNR.  Plan for one-way extubation at some point  Severe ACUTE Hypoxic and Hypercapnic Respiratory Failure -continue Mechanical Ventilator support -Wean Fio2 and PEEP as tolerated -VAP/VENT bundle implementation - Wean PEEP & FiO2 as tolerated, maintain SpO2 > 88% - Head of bed elevated  30 degrees, VAP protocol in place - Plateau pressures less than 30 cm H20  - Intermittent chest x-ray & ABG PRN - Ensure adequate pulmonary hygiene     Acute Metabolic Encephalopathy Acute to Subacute large Left MCA Infarcts  CO2 Narcosis Sedation needs in setting of mechanical ventilation PMHx: Depression, anxiety  MRI Brain 10/23: Large areas of multifocal acute/subacute ischemia within the left MCA and multiple small areas of acute/subacute ischemia in the right hemisphere including the right occipital lobe and right parietal lobe  SEVERE BRAIN DAMAGE PLAN FOR ONE WAY EXTUBATION    Cardiogenic shock A-fib with RVR Demand ischemia Aspirin  therapy I do not recommend anticoagulation therapy at this time Currently on amiodarone  infusion -Echocardiogram 11/27/23: LVEF 60-65%, Grade I DD, RV systolic function normal, RV size normal, normal pulmonary artery systolic pressure -Continuous cardiac monitoring -Maintain MAP >65 -Trend lactic acid until normalized -HS Troponin peaked at 425 -Continue Amiodarone  infusion -Cardiology consulted, appreciate input ~ will hold off on anticoagulation for now given risk of hemorrhagic conversion of strokes ~ will defer to Neurology on timing for anticoagulation/antiplatelets    COPD with pulmonary infection Sputum culture pending Continue antibiotics Start steroids  RENAL -continue Foley Catheter-assess need -Avoid nephrotoxic agents -Follow urine output, BMP -Ensure adequate renal perfusion, optimize oxygenation -Renal dose medications   Intake/Output Summary (Last 24 hours) at Dec 21, 2023 0806 Last data filed at 12/21/2023 9262 Gross per 24 hour  Intake 2396.19 ml  Output 2375 ml  Net 21.19 ml      Latest Ref Rng & Units 12-21-2023    4:05 AM 11/30/2023    4:04 AM 11/29/2023    4:04 AM  BMP  Glucose 70 - 99 mg/dL 775  739  760   BUN 8 - 23 mg/dL 46  25  33   Creatinine 0.44 - 1.00 mg/dL 8.98  9.08  9.04   Sodium 135 - 145  mmol/L 139  140  138   Potassium 3.5 - 5.1 mmol/L 4.2  3.9  3.8   Chloride 98 - 111 mmol/L 100  100  100   CO2 22 - 32 mmol/L 31  30  28    Calcium  8.9 - 10.3 mg/dL 8.7  8.4  8.0     ENDO - ICU hypoglycemic\Hyperglycemia protocol -check FSBS per protocol   GI GI PROPHYLAXIS as indicated NUTRITIONAL STATUS DIET-->TF's as tolerated Constipation protocol as indicated   ELECTROLYTES -follow labs as needed -replace as needed -pharmacy consultation and following  RESTRICTIVE TRANSFUSION PROTOCOL TRANSFUSION  IF HGB<7  or ACTIVE BLEEDING OR DX of ACUTE CORONARY SYNDROMES         Best Practice (right click and Reselect all SmartList Selections daily)   Diet/type: NPO, tube feeds  DVT prophylaxis: Lovenox  GI prophylaxis: PPI Lines: central line and is still needed Foley:  yes, and still needed Code Status:  DNR Last date of multidisciplinary goals of  care discussion [10/24]  10/ 24: Updated pt's sons at bedside on plan of care.  Dec 02, 2023 updated family, plan for one way extubation, patient would NOT want to live on machines  Labs   CBC: Recent Labs  Lab 11/27/23 1013 11/28/23 0415 11/29/23 0404 11/30/23 0404 12/02/23 0405  WBC 17.6* 12.5* 9.3 10.8* 12.1*  NEUTROABS 12.7*  --   --   --   --   HGB 12.3 10.7* 9.7* 11.3* 11.5*  HCT 42.8 35.9* 32.5* 36.7 37.0  MCV 100.0 95.7 96.4 93.9 92.5  PLT 333 213 156 214 285    Basic Metabolic Panel: Recent Labs  Lab 11/27/23 1130 11/28/23 0415 11/29/23 0404 11/30/23 0404 12-02-2023 0405  NA 142 139 138 140 139  K 3.8 4.2 3.8 3.9 4.2  CL 100 104 100 100 100  CO2 19* 25 28 30 31   GLUCOSE 256* 207* 239* 260* 224*  BUN 29* 35* 33* 25* 46*  CREATININE 1.70* 1.28* 0.95 0.91 1.01*  CALCIUM  8.5* 8.1* 8.0* 8.4* 8.7*  MG  --   --  1.8 2.3 2.4  PHOS  --  2.4* 1.7* 2.5 2.5   GFR: Estimated Creatinine Clearance: 51.2 mL/min (A) (by C-G formula based on SCr of 1.01 mg/dL (H)). Recent Labs  Lab 11/27/23 1246 11/27/23 1816  11/27/23 2028 11/28/23 0053 11/28/23 0415 11/29/23 0403 11/29/23 0404 11/30/23 0404 12-02-23 0405  PROCALCITON  --  0.74  --   --   --  0.42  --   --   --   WBC  --   --   --   --  12.5*  --  9.3 10.8* 12.1*  LATICACIDVEN 6.0* 3.1* 3.0* 2.4*  --   --   --   --   --     Liver Function Tests: Recent Labs  Lab 11/27/23 1130 11/28/23 0415 11/29/23 0404 11/30/23 0404 December 02, 2023 0405  AST 127*  --   --   --   --   ALT 58*  --   --   --   --   ALKPHOS 71  --   --   --   --   BILITOT 0.8  --   --   --   --   PROT 6.8  --   --   --   --   ALBUMIN 3.2* 2.7* 2.4* 2.7* 2.8*   No results for input(s): LIPASE, AMYLASE in the last 168 hours. No results for input(s): AMMONIA in the last 168 hours.  ABG    Component Value Date/Time   PHART 7.37 11/28/2023 0650   PCO2ART 52 (H) 11/28/2023 0650   PO2ART 77 (L) 11/28/2023 0650   HCO3 30.1 (H) 11/28/2023 0650   ACIDBASEDEF 1.4 11/27/2023 2023   O2SAT 98.4 11/28/2023 0650      DVT/GI PRX  assessed I Assessed the need for Labs I Assessed the need for Foley I Assessed the need for Central Venous Line Family Discussion when available I Assessed the need for Mobilization I made an Assessment of medications to be adjusted accordingly Safety Risk assessment completed  CASE DISCUSSED IN MULTIDISCIPLINARY ROUNDS WITH ICU TEAM     Critical Care Time devoted to patient care services described in this note is 65 minutes.  Critical care was necessary to treat /prevent imminent and life-threatening deterioration. Overall, patient is critically ill, prognosis is guarded.  Patient with Multiorgan failure and at high risk for cardiac arrest and death.    Nickolas Alm Cellar, M.D.  [facility]  Medical Director [facility]

## 2023-12-07 NOTE — Death Summary Note (Signed)
 DEATH SUMMARY   Patient Details  Name: [name] MRN: [id number] DOB: Oct 04, 1950  Admission/Discharge Information   Admit Date:  11/16/2023  Date of Death: Date of Death: 05-Dec-2023  Time of Death: Time of Death: 12-14-1747  Length of Stay: 4  Referring Physician: Antonette Angeline ORN, NP   Reason(s) for Hospitalization  Acute CVA  Diagnoses  Preliminary cause of death: ACUTE CVA Secondary Diagnoses (including complications and co-morbidities):  Principal Problem:   Unresponsive Active Problems:   Sepsis (HCC)   Shock (HCC)   Altered mental status   Acute respiratory failure with hypoxia and hypercapnia (HCC)   PAF (paroxysmal atrial fibrillation) (HCC)   Acute stroke due to ischemia [facility])   Aspiration into airway   Brief Hospital Course (including significant findings, care, treatment, and services provided and events leading to death)   Brief Pt Description / Synopsis:  73 year old female with past medical history significant for COPD on 2 L supplemental oxygen , hypothyroidism and recent robotic assisted umbilical hernia repair on 11/25/23, who is admitted with acute metabolic encephalopathy and acute on chronic hypoxic and hypercapnic respiratory failure, sepsis due to suspected aspiration pneumonia, and acute kidney injury requiring intubation and mechanical ventilation.  Found to have acute to subacute large left MCA infarcts, along with multiple small areas of acute/subacute infarcts in the right occipital lobe and right parietal lobes.    History of Present Illness:  [name] is a 73 year old female with a past medical history significant for COPD, chronic hypoxic respiratory failure requiring 2 L supplemental oxygen , hypothyroidism, hypertension, and anxiety who presents to [facility] ED on 11/08/2023 for evaluation of altered mental status.  Patient is currently altered and on the ventilator and unable to contribute to history, and no family currently available, therefore history  is obtained from chart review.   It is reported that she was found down and minimally responsive by family earlier this morning.  She underwent robotic assisted umbilical hernia repair surgery on 11/25/2023 by Dr Marinda and was discharged home yesterday 10/21.  Her last known well time was approximately 8:00 PM last night night prior to bedtime, where her son talked to her over the telephone and said she sounded fine.  On his way home from work this morning, he attempted to call her of which she did not answer.  He went by her house to check on her and she did not come to the door.   Upon EMS arrival she was noted to be hypoxic with sats in the 50s on room air which she was subsequent placed on nonrebreather with improvement of saturations to the 70s.  There was also concern for possible tongue swelling which she was given IM epinephrine  by EMS.   On arrival to the ED she remained unresponsive  and somnolent, with gurgling respirations and with significant thick white secretions in the posterior oropharynx.  She required emergent intubation by ED provider.  ED provider did not appreciate any significant tongue/lip, or airway edema to suggest angioedema or anaphylaxis.    Imaging Chest X-ray>>IMPRESSION: 1. Patchy airspace opacities in the left lung base, possibly atelectasis or aspiration. Trace left pleural effusion. 2. Well-positioned endotracheal tube. CT Head>>IMPRESSION: 1. Acute to early subacute left MCA infarcts. No hemorrhage.   PCCM asked to admit for further workup and treatment.     Pertinent  Medical History        Past Medical History:  Diagnosis Date   COPD (chronic obstructive pulmonary disease) (HCC)  Micro Data:  10/22: Blood cultures x 2>>no growth to date  10/22: COVID/flu/RSV PCR>> negative 10/22: Respiratory viral panel>> negative 10/22: Strep pneumo urinary antigen>>negative  10/22: Legionella urinary antigen>>NEG 10/22: MRSA PCR>> negative  10/22:  Tracheal aspirate>>NEG   Antimicrobials:    Anti-infectives (From admission, onward)        Start     Dose/Rate Route Frequency Ordered Stop    11/27/23 1600   piperacillin-tazobactam (ZOSYN) IVPB 3.375 g        3.375 g 12.5 mL/hr over 240 Minutes Intravenous Every 8 hours 11/27/23 1447      11/27/23 1315   azithromycin  (ZITHROMAX ) 500 mg in sodium chloride  0.9 % 250 mL IVPB        500 mg 250 mL/hr over 60 Minutes Intravenous Every 24 hours 11/27/23 1308 11/29/23 1320    11/27/23 1215   piperacillin-tazobactam (ZOSYN) IVPB 3.375 g        3.375 g 100 mL/hr over 30 Minutes Intravenous  Once 11/27/23 1212 11/27/23 1312    11/27/23 1215   vancomycin (VANCOCIN) IVPB 1000 mg/200 mL premix        1,000 mg 200 mL/hr over 60 Minutes Intravenous  Once 11/27/23 1212 11/27/23 2302    11/27/23 1200   Ampicillin-Sulbactam (UNASYN) 3 g in sodium chloride  0.9 % 100 mL IVPB  Status:  Discontinued        3 g 200 mL/hr over 30 Minutes Intravenous  Once 11/27/23 1155 11/27/23 1212             Significant Hospital Events: Including procedures, antibiotic start and stop dates in addition to other pertinent events   10/22: Presented to ED unresponsive and hypoxic requiring emergent intubation by ED provider.  Postintubation with hypotension requiring IV fluids and initiation of peripheral Levophed.  PCCM asked to admit.  CT Head concerning for acute to subacute Left MCA infarcts, Neurology consulted.  10/23: MRI overnight showing large areas of multifocal acute/early subacute ischemia within the left MCA territory, along with multiple small areas of acute/early subacute ischemia in the right hemisphere including the right occipital and parietal lobes. Levophed weaned down with addition of vasopressin.  Remains on vent. Renal function improving, UOP 1L last 24 hrs (net +1.1 L).  Developed A.fib with RVR requiring Amiodarone  bolus and infusion, Cardiology consulted.  10/24: No significant events overnight.  Low grade temperature this morning (T max 100.5), Leukocytosis has resolved, but with increase in secretions from ETT, will obtain Tracheal aspirate.  Converted back to NSR on Amiodarone  gtt, remains on Levophed and Vasopressin.  On minimal vent support, WUA & SBT as tolerated. AKI continues to improve, UOP 1.7 L last 24 hrs (net + 1.7 L). 10/25 unable to wean from vent, unable to protect airway, failed weaning trial, family updated       Patient remains DNR, patient unable to protect airway 12-30-23 plan for one way extubation    GOALS OF CARE DISCUSSION  The Clinical status was relayed to family in detail-  Updated and notified of patients medical condition- Patient remains unresponsive and will not open eyes to command.   Patient with increased WOB and using accessory muscles to breathe Explained to family course of therapy and the modalities  Patient with Progressive multiorgan failure with a very high probablity of a very minimal chance of meaningful recovery despite all aggressive and optimal medical therapy.    They have consented and agreed to DNR/DNI and would like to proceed with Comfort care measures  one way extubation  Family are satisfied with Plan of action and management. All questions answered    Pertinent Labs and Studies  Significant Diagnostic Studies DG Chest Port 1 View Result Date: 11/30/2023 EXAM: 1 VIEW XRAY OF THE CHEST 11/30/2023 04:35:00 AM COMPARISON: 11/28/2023 CLINICAL HISTORY: Acute respiratory failure with hypoxia and hypercarbia FINDINGS: LINES, TUBES AND DEVICES: Endotracheal tube in place with tip 1.5 cm above the carina. Stable right IJ central venous catheter. Enteric tube in place with tip in gastric fundus. LUNGS AND PLEURA: Low lung volumes. Small left pleural effusion. Similar bibasilar opacities. No pulmonary edema. No pneumothorax. HEART AND MEDIASTINUM: Aortic atherosclerosis. No acute abnormality of the cardiac and mediastinal silhouettes. BONES  AND SOFT TISSUES: No acute osseous abnormality. IMPRESSION: 1. Low lung volumes with similar mild bibasilar opacities, which may reflect atelectasis or aspiration/pneumonia in the appropriate setting. 2. Small left pleural effusion. Electronically signed by: Waddell Calk MD 11/30/2023 05:08 AM EDT RP Workstation: [id number]   ECHOCARDIOGRAM COMPLETE Result Date: 11/28/2023    ECHOCARDIOGRAM REPORT   Patient Name:   CARIANA KARGE Manthei Date of Exam: 11/27/2023 Medical Rec #:  [id number]        Height:       63.0 in Accession #:    [id number]       Weight:       193.4 lb Date of Birth:  07-Dec-1950        BSA:          1.906 m Patient Age:    73 years         BP:           98/38 mmHg Patient Gender: F                HR:           85 bpm. Exam Location:  [facility] Procedure: 2D Echo, Cardiac Doppler and Color Doppler (Both Spectral and Color            Flow Doppler were utilized during procedure). Indications:    R57.9 Shock  History:        Patient has no prior history of Echocardiogram examinations.                 COPD.  Sonographer:    Carl Coma RDCS Referring Phys: [id number] INGE JONETTA LECHER Diagnosing      Timothy Gollan MD Phys: IMPRESSIONS  1. Left ventricular ejection fraction, by estimation, is 60 to 65%. Left ventricular ejection fraction by PLAX is 69 %. The left ventricle has normal function. The left ventricle has no regional wall motion abnormalities. There is mild left ventricular hypertrophy. Left ventricular diastolic parameters are consistent with Grade I diastolic dysfunction (impaired relaxation).  2. Right ventricular systolic function is normal. The right ventricular size is normal. There is normal pulmonary artery systolic pressure. The estimated right ventricular systolic pressure is 28.8 mmHg.  3. The mitral valve is normal in structure. No evidence of mitral valve regurgitation. No evidence of mitral stenosis.  4. The aortic valve is normal in structure. Aortic valve regurgitation is  not visualized. Aortic valve sclerosis is present, with no evidence of aortic valve stenosis.  5. The inferior vena cava is normal in size with greater than 50% respiratory variability, suggesting right atrial pressure of 3 mmHg. FINDINGS  Left Ventricle: Left ventricular ejection fraction, by estimation, is 60 to 65%. Left ventricular ejection fraction by PLAX is 69 %. The left ventricle has normal function. The  left ventricle has no regional wall motion abnormalities. Strain was performed and the global longitudinal strain is indeterminate. The left ventricular internal cavity size was normal in size. There is mild left ventricular hypertrophy. Left ventricular diastolic parameters are consistent with Grade I diastolic dysfunction (impaired relaxation). Right Ventricle: The right ventricular size is normal. No increase in right ventricular wall thickness. Right ventricular systolic function is normal. There is normal pulmonary artery systolic pressure. The tricuspid regurgitant velocity is 2.44 m/s, and  with an assumed right atrial pressure of 5 mmHg, the estimated right ventricular systolic pressure is 28.8 mmHg. Left Atrium: Left atrial size was normal in size. Right Atrium: Right atrial size was normal in size. Pericardium: There is no evidence of pericardial effusion. Mitral Valve: The mitral valve is normal in structure. No evidence of mitral valve regurgitation. No evidence of mitral valve stenosis. Tricuspid Valve: The tricuspid valve is normal in structure. Tricuspid valve regurgitation is mild . No evidence of tricuspid stenosis. Aortic Valve: The aortic valve is normal in structure. Aortic valve regurgitation is not visualized. Aortic valve sclerosis is present, with no evidence of aortic valve stenosis. Pulmonic Valve: The pulmonic valve was normal in structure. Pulmonic valve regurgitation is not visualized. No evidence of pulmonic stenosis. Aorta: The aortic root is normal in size and structure.  Venous: The inferior vena cava is normal in size with greater than 50% respiratory variability, suggesting right atrial pressure of 3 mmHg. IAS/Shunts: No atrial level shunt detected by color flow Doppler. Additional Comments: 3D was performed not requiring image post processing on an independent workstation and was indeterminate.  LEFT VENTRICLE PLAX 2D LV EF:         Left            Diastology                ventricular     LV e' medial:    5.33 cm/s                ejection        LV E/e' medial:  11.7                fraction by     LV e' lateral:   5.95 cm/s                PLAX is 69      LV E/e' lateral: 10.5                %. LVIDd:         3.90 cm LVIDs:         2.40 cm LV PW:         0.90 cm LV IVS:        0.90 cm LVOT diam:     1.80 cm LV SV:         50 LV SV Index:   26 LVOT Area:     2.54 cm  RIGHT VENTRICLE            IVC RV Basal diam:  3.80 cm    IVC diam: 2.20 cm RV S prime:     9.84 cm/s TAPSE (M-mode): 1.6 cm LEFT ATRIUM             Index        RIGHT ATRIUM          Index LA diam:        3.60 cm 1.89 cm/m   RA  Area:     9.69 cm LA Vol (A2C):   30.6 ml 16.05 ml/m  RA Volume:   21.10 ml 11.07 ml/m LA Vol (A4C):   21.5 ml 11.28 ml/m LA Biplane Vol: 25.7 ml 13.48 ml/m  AORTIC VALVE LVOT Vmax:   115.50 cm/s LVOT Vmean:  76.050 cm/s LVOT VTI:    0.196 m  AORTA Ao Root diam: 3.50 cm Ao Asc diam:  3.00 cm MITRAL VALVE                TRICUSPID VALVE MV Area (PHT): 3.29 cm     TR Peak grad:   23.8 mmHg MV Decel Time: 231 msec     TR Vmax:        244.00 cm/s MV E velocity: 62.60 cm/s MV A velocity: 101.25 cm/s  SHUNTS MV E/A ratio:  0.62         Systemic VTI:  0.20 m                             Systemic Diam: 1.80 cm Evalene Lunger MD Electronically signed by Evalene Lunger MD Signature Date/Time: 11/28/2023/2:49:27 PM    Final    DG Chest Port 1 View Result Date: 11/28/2023 EXAM: 1 VIEW(S) XRAY OF THE CHEST 11/28/2023 05:19:00 AM COMPARISON: 11/27/2023 CLINICAL HISTORY: Acute respiratory failure  with hypoxia and hypercarbia (HCC) 8762515. The reason for exam is stated as acute respiratory failure with hypoxia and hypercarbia. Hx of COPD. FINDINGS: LINES, TUBES AND DEVICES: Endotracheal tube in place with tip 2.5 cm above the carina. Enteric tube in place, coursing below the diaphragm. Right IJ central venous catheter in place with tip overlying the SVC. LUNGS AND PLEURA: Small left pleural effusion. Persistent low lung volumes with bibasilar opacities, similar to prior. No pulmonary edema. No pneumothorax. HEART AND MEDIASTINUM: Aortic atherosclerosis. No acute abnormality of the cardiac and mediastinal silhouettes. BONES AND SOFT TISSUES: No acute osseous abnormality. IMPRESSION: 1. Persistent low lung volumes with unchanged bibasilar opacities, similar to prior. Electronically signed by: Waddell Calk MD 11/28/2023 05:43 AM EDT RP Workstation: [id number]   DG Abd 1 View Result Date: 11/28/2023 EXAM: 1 VIEW XRAY OF THE ABDOMEN 11/28/2023 12:16:00 AM COMPARISON: None available. CLINICAL HISTORY: [id number] Encounter for imaging study to confirm orogastric (OG) tube placement [id number]. OG tube placement verification. Encounter for imaging study to confirm orogastric (OG) tube placement [id number]. OG tube placement verification. FINDINGS: BOWEL: Enteric tube tip and side port in the stomach. IMPRESSION: 1. Enteric tube tip and side port in the stomach. Electronically signed by: Norman Gatlin MD 11/28/2023 12:19 AM EDT RP Workstation: [id number]   MR BRAIN WO CONTRAST Result Date: 11/27/2023 EXAM: MRI BRAIN WITHOUT CONTRAST 11/27/2023 10:22:27 PM TECHNIQUE: Multiplanar multisequence MRI of the head/brain was performed without the administration of intravenous contrast. COMPARISON: None available. CLINICAL HISTORY: Neuro deficit, acute, stroke suspected; multiple acute infarcts on head CT. Presents to the ED for altered mental status. Patient was reportedly found down in bed this morning by family, minimally  responsive at that time. EMS was contacted and noted patient to be hypoxic to the 50s on room air, placed on nonrebreather with partial improvement into the 70s. There was concern that she had tongue swelling and possible allergic reaction, patient given IM epinephrine  by EMS. On arrival to the ED, patient is unresponsive. FINDINGS: BRAIN AND VENTRICLES: Large areas of multifocal acute/early subacute ischemia within the left MCA territory. The largest area is  in the anterior left frontal lobe. Other affected areas include the posterior parietal lobe and the temporal lobe. Multiple small areas of acute/early subacute ischemia in the right hemisphere including the right occipital lobe and right parietal lobe. No intracranial hemorrhage. No mass. No midline shift. No hydrocephalus. The sella is unremarkable. Normal flow voids. ORBITS: No acute abnormality. SINUSES AND MASTOIDS: No acute abnormality. BONES AND SOFT TISSUES: Normal marrow signal. No acute soft tissue abnormality. IMPRESSION: 1. Large areas of multifocal acute/early subacute ischemia within the left MCA territory. 2. Multiple small areas of acute/early subacute ischemia in the right hemisphere, including the right occipital lobe and right parietal lobe. Electronically signed by: Franky Stanford MD 11/27/2023 10:39 PM EDT RP Workstation: [id number]   DG Chest Port 1 View Result Date: 11/27/2023 EXAM: 1 VIEW(S) XRAY OF THE CHEST 11/27/2023 05:17:44 PM COMPARISON: 11/27/2023 CLINICAL HISTORY: Encounter for central line placement FINDINGS: LINES, TUBES AND DEVICES: ETT in place with tip 2.0 cm above the carina. Right internal jugular central venous catheter with distal tip at mid superior vena cava. Left chest cardiac defibrillator pads noted. LUNGS AND PLEURA: Left basilar consolidation. New consolidation in the right mid lung. Trace left pleural effusion. No pulmonary edema. No pneumothorax. HEART AND MEDIASTINUM: Atherosclerotic calcifications. No acute  abnormality of the cardiac and mediastinal silhouettes. BONES AND SOFT TISSUES: No acute osseous abnormality. IMPRESSION: 1. New consolidation in the right mid lung. Differential is atelectasis, aspiration, or pneumonia. 2. Similar small left pleural effusion and left basilar atelectasis or pneumonia. 3. ETT in place with tip 2.0 cm above the carina. 4. Right internal jugular central venous catheter with distal tip at mid superior vena cava. Electronically signed by: Norman Gatlin MD 11/27/2023 05:24 PM EDT RP Workstation: [id number]   CT Head Wo Contrast Result Date: 11/27/2023 EXAM: CT HEAD WITHOUT CONTRAST 11/27/2023 01:11:46 PM TECHNIQUE: CT of the head was performed without the administration of intravenous contrast. Automated exposure control, iterative reconstruction, and/or weight based adjustment of the mA/kV was utilized to reduce the radiation dose to as low as reasonably achievable. COMPARISON: Head CT 05/17/2023 CLINICAL HISTORY: Mental status change, unknown cause. FINDINGS: BRAIN AND VENTRICLES: There are new moderate sized regions of cytotoxic edema involving the left frontal lobe centered at the level of the operculum, lateral left parietal lobe, and lateral left occipital lobe compatible with acute to early subacute MCA infarcts. There is mild sulcal effacement without midline shift or other significant mass effect. No acute intracranial hemorrhage, mass, hydrocephalus, or extra axial fluid collection is identified. Calcified atherosclerosis at the skull base. No hyperdense vessel. ORBITS: Bilateral cataract extraction. SINUSES: Chronic right maxillary sinusitis with progressive, essentially complete opacification of the included portion of the sinus today. Mild bilateral ethmoid air cell mucosal thickening. Clear mastoid air cells. SOFT TISSUES AND SKULL: No acute soft tissue abnormality. No skull fracture. IMPRESSION: 1. Acute to early subacute left MCA infarcts. No hemorrhage.  Electronically signed by: Dasie Hamburg MD 11/27/2023 01:39 PM EDT RP Workstation: [id number]   CT ABDOMEN PELVIS WO CONTRAST Result Date: 11/27/2023 CLINICAL DATA:  Abdominal pain EXAM: CT ABDOMEN AND PELVIS WITHOUT CONTRAST TECHNIQUE: Multidetector CT imaging of the abdomen and pelvis was performed following the standard protocol without IV contrast. RADIATION DOSE REDUCTION: This exam was performed according to the departmental dose-optimization program which includes automated exposure control, adjustment of the mA and/or kV according to patient size and/or use of iterative reconstruction technique. COMPARISON:  03/25/2023 FINDINGS: Lower chest: Trace bilateral pleural effusions. Airway thickening and  lower lobe airspace opacities, left greater than right could reflect atelectasis or bronchopneumonia. Coronary artery calcifications. Hepatobiliary: 5 views low-density throughout the liver compatible with fatty infiltration. No focal hepatic abnormality. Gallbladder unremarkable. Pancreas: No focal abnormality or ductal dilatation. Spleen: No focal abnormality.  Normal size. Adrenals/Urinary Tract: Right adrenal gland is normal. Stable left adrenal nodule which is low-density compatible with adenoma. No renal mass or hydronephrosis. Foley catheter in the bladder which is decompressed. Stomach/Bowel: Cough for 5 5 colonic diverticulosis. No active diverticulitis. Stomach and small bowel decompressed. No bowel obstruction or inflammatory process. Vascular/Lymphatic: Diffuse aortic atherosclerosis. No evidence of aneurysm or adenopathy. Reproductive: Prior hysterectomy. Left ovarian cyst measures 4.5 cm and is stable since prior study. Other: No free fluid or free air. Musculoskeletal: There appears to be a left abdominal wall wound. Air noted within the left abdominal wall underlying this wound and extending laterally in the left abdominal wall. Question recent surgical procedure. No acute bony abnormality.  IMPRESSION: Apparent abdominal wall wound to the left of midline with underlying subcutaneous air extending laterally in the left abdominal wall. Question recent surgical procedure. If no recent procedure, this would be concerning for possible infection. No drainable focal fluid collection. Colonic diverticulosis.  No active diverticulitis. Stable 4.5 cm left ovarian cyst. Aortic atherosclerosis. Trace bilateral pleural effusions with bilateral lower lobe airway thickening and lower lobe opacities which could reflect atelectasis or developing bronchopneumonia. Electronically Signed   By: Franky Crease M.D.   On: 11/27/2023 13:37   DG Chest Port 1 View Result Date: 11/27/2023 CLINICAL DATA:  [id number] Endotracheal tube present [id number] EXAM: PORTABLE CHEST - 1 VIEW COMPARISON:  April 17, 2023 FINDINGS: Endotracheal tube terminates in the mid trachea. Patchy airspace opacities in the left lung base. Trace left pleural effusion. No pneumothorax. Mild cardiomegaly. Tortuous aorta with aortic atherosclerosis. No acute fracture or destructive lesions. Multilevel thoracic osteophytosis. Defibrillator pad overlying the left chest. IMPRESSION: 1. Patchy airspace opacities in the left lung base, possibly atelectasis or aspiration. Trace left pleural effusion. 2. Well-positioned endotracheal tube. Electronically Signed   By: Rogelia Myers M.D.   On: 11/27/2023 11:34    Microbiology Recent Results (from the past 240 hours)  Culture, blood (routine x 2)     Status: None (Preliminary result)   Collection Time: 11/27/23 10:50 AM   Specimen: BLOOD RIGHT HAND  Result Value Ref Range Status   Specimen Description BLOOD RIGHT HAND  Final   Special Requests   Final    BOTTLES DRAWN AEROBIC AND ANAEROBIC Blood Culture results may not be optimal due to an inadequate volume of blood received in culture bottles   Culture   Final    NO GROWTH 4 DAYS Performed at [facility], [address] [place], [place]  [address]    Report Status PENDING  Incomplete  Culture, blood (routine x 2)     Status: None (Preliminary result)   Collection Time: 11/27/23 11:18 AM   Specimen: BLOOD RIGHT ARM  Result Value Ref Range Status   Specimen Description BLOOD RIGHT ARM  Final   Special Requests BAA Blood Culture adequate volume  Final   Culture   Final    NO GROWTH 4 DAYS Performed at [facility], [address] [place], [place] [address]    Report Status PENDING  Incomplete  Resp panel by RT-PCR (RSV, Flu A&B, Covid) Anterior Nasal Swab     Status: None   Collection Time: 11/27/23  2:51 PM  Specimen: Anterior Nasal Swab  Result Value Ref Range Status   SARS Coronavirus 2 by RT PCR NEGATIVE NEGATIVE Final    Comment: (NOTE) SARS-CoV-2 target nucleic acids are NOT DETECTED.  The SARS-CoV-2 RNA is generally detectable in upper respiratory specimens during the acute phase of infection. The lowest concentration of SARS-CoV-2 viral copies this assay can detect is 138 copies/mL. A negative result does not preclude SARS-Cov-2 infection and should not be used as the sole basis for treatment or other patient management decisions. A negative result may occur with  improper specimen collection/handling, submission of specimen other than nasopharyngeal swab, presence of viral mutation(s) within the areas targeted by this assay, and inadequate number of viral copies(<138 copies/mL). A negative result must be combined with clinical observations, patient history, and epidemiological information. The expected result is Negative.  Fact Sheet for Patients:  bloggercourse.com  Fact Sheet for Healthcare Providers:  seriousbroker.it  This test is no t yet approved or cleared by the [place]  FDA and  has been authorized for detection and/or diagnosis of SARS-CoV-2 by FDA under an Emergency Use Authorization (EUA). This EUA will remain  in effect  (meaning this test can be used) for the duration of the COVID-19 declaration under Section 564(b)(1) of the Act, 21 U.S.C.section 360bbb-3(b)(1), unless the authorization is terminated  or revoked sooner.       Influenza A by PCR NEGATIVE NEGATIVE Final   Influenza B by PCR NEGATIVE NEGATIVE Final    Comment: (NOTE) The Xpert Xpress SARS-CoV-2/FLU/RSV plus assay is intended as an aid in the diagnosis of influenza from Nasopharyngeal swab specimens and should not be used as a sole basis for treatment. Nasal washings and aspirates are unacceptable for Xpert Xpress SARS-CoV-2/FLU/RSV testing.  Fact Sheet for Patients: bloggercourse.com  Fact Sheet for Healthcare Providers: seriousbroker.it  This test is not yet approved or cleared by the [place]  FDA and has been authorized for detection and/or diagnosis of SARS-CoV-2 by FDA under an Emergency Use Authorization (EUA). This EUA will remain in effect (meaning this test can be used) for the duration of the COVID-19 declaration under Section 564(b)(1) of the Act, 21 U.S.C. section 360bbb-3(b)(1), unless the authorization is terminated or revoked.     Resp Syncytial Virus by PCR NEGATIVE NEGATIVE Final    Comment: (NOTE) Fact Sheet for Patients: bloggercourse.com  Fact Sheet for Healthcare Providers: seriousbroker.it  This test is not yet approved or cleared by the [place]  FDA and has been authorized for detection and/or diagnosis of SARS-CoV-2 by FDA under an Emergency Use Authorization (EUA). This EUA will remain in effect (meaning this test can be used) for the duration of the COVID-19 declaration under Section 564(b)(1) of the Act, 21 U.S.C. section 360bbb-3(b)(1), unless the authorization is terminated or revoked.  Performed at [facility], [address] [place], [place] [address]   Respiratory (~20  pathogens) panel by PCR     Status: None   Collection Time: 11/27/23  2:51 PM   Specimen: Nasopharyngeal Swab; Respiratory  Result Value Ref Range Status   Adenovirus NOT DETECTED NOT DETECTED Final   Coronavirus 229E NOT DETECTED NOT DETECTED Final    Comment: (NOTE) The Coronavirus on the Respiratory Panel, DOES NOT test for the novel  Coronavirus (2019 nCoV)    Coronavirus HKU1 NOT DETECTED NOT DETECTED Final   Coronavirus NL63 NOT DETECTED NOT DETECTED Final   Coronavirus OC43 NOT DETECTED NOT DETECTED Final   Metapneumovirus NOT DETECTED NOT DETECTED Final  Rhinovirus / Enterovirus NOT DETECTED NOT DETECTED Final   Influenza A NOT DETECTED NOT DETECTED Final   Influenza B NOT DETECTED NOT DETECTED Final   Parainfluenza Virus 1 NOT DETECTED NOT DETECTED Final   Parainfluenza Virus 2 NOT DETECTED NOT DETECTED Final   Parainfluenza Virus 3 NOT DETECTED NOT DETECTED Final   Parainfluenza Virus 4 NOT DETECTED NOT DETECTED Final   Respiratory Syncytial Virus NOT DETECTED NOT DETECTED Final   Bordetella pertussis NOT DETECTED NOT DETECTED Final   Bordetella Parapertussis NOT DETECTED NOT DETECTED Final   Chlamydophila pneumoniae NOT DETECTED NOT DETECTED Final   Mycoplasma pneumoniae NOT DETECTED NOT DETECTED Final    Comment: Performed at [facility], [address] [place], [place] [address]  MRSA Next Gen by PCR, Nasal     Status: None   Collection Time: 11/27/23  3:22 PM  Result Value Ref Range Status   MRSA by PCR Next Gen NOT DETECTED NOT DETECTED Final    Comment: (NOTE) The GeneXpert MRSA Assay (FDA approved for NASAL specimens only), is one component of a comprehensive MRSA colonization surveillance program. It is not intended to diagnose MRSA infection nor to guide or monitor treatment for MRSA infections. Test performance is not FDA approved in patients less than 80 years old. Performed at [facility], [address] [place], [place] [address]    Culture, Respiratory w Gram Stain     Status: None (Preliminary result)   Collection Time: 11/29/23 10:45 AM   Specimen: Tracheal Aspirate; Respiratory  Result Value Ref Range Status   Specimen Description   Final    TRACHEAL ASPIRATE Performed at [facility], [address] [place], [place] [address]    Special Requests   Final    NONE Performed at [facility], [address] [place], [place] [address]    Gram Stain NO WBC SEEN NO ORGANISMS SEEN   Final   Culture   Final    CULTURE REINCUBATED FOR BETTER GROWTH Performed at [facility], [address], [place], [place] [address]    Report Status PENDING  Incomplete    Lab Basic Metabolic Panel: Recent Labs  Lab 11/27/23 1130 11/28/23 0415 11/29/23 0404 11/30/23 0404 12/20/23 0405  NA 142 139 138 140 139  K 3.8 4.2 3.8 3.9 4.2  CL 100 104 100 100 100  CO2 19* 25 28 30 31   GLUCOSE 256* 207* 239* 260* 224*  BUN 29* 35* 33* 25* 46*  CREATININE 1.70* 1.28* 0.95 0.91 1.01*  CALCIUM  8.5* 8.1* 8.0* 8.4* 8.7*  MG  --   --  1.8 2.3 2.4  PHOS  --  2.4* 1.7* 2.5 2.5   Liver Function Tests: Recent Labs  Lab 11/27/23 1130 11/28/23 0415 11/29/23 0404 11/30/23 0404 Dec 20, 2023 0405  AST 127*  --   --   --   --   ALT 58*  --   --   --   --   ALKPHOS 71  --   --   --   --   BILITOT 0.8  --   --   --   --   PROT 6.8  --   --   --   --   ALBUMIN 3.2* 2.7* 2.4* 2.7* 2.8*   No results for input(s): LIPASE, AMYLASE in the last 168 hours. No results for input(s): AMMONIA in the last 168 hours. CBC: Recent Labs  Lab 11/27/23 1013 11/28/23 0415 11/29/23 0404 11/30/23  0404 12/23/23 0405  WBC 17.6* 12.5* 9.3 10.8* 12.1*  NEUTROABS 12.7*  --   --   --   --   HGB 12.3 10.7* 9.7* 11.3* 11.5*  HCT 42.8 35.9* 32.5* 36.7 37.0  MCV 100.0 95.7 96.4 93.9 92.5  PLT 333 213 156 214 285   Cardiac Enzymes: No results for input(s): CKTOTAL, CKMB, CKMBINDEX, TROPONINI in the last 168  hours. Sepsis Labs: Recent Labs  Lab 11/27/23 1246 11/27/23 1816 11/27/23 2028 11/28/23 0053 11/28/23 0415 11/29/23 0403 11/29/23 0404 11/30/23 0404 December 23, 2023 0405  PROCALCITON  --  0.74  --   --   --  0.42  --   --   --   WBC  --   --   --   --  12.5*  --  9.3 10.8* 12.1*  LATICACIDVEN 6.0* 3.1* 3.0* 2.4*  --   --   --   --   --      Nickolas Khalen Styer Dec 23, 2023, 6:11 PM

## 2023-12-07 NOTE — IPAL (Signed)
   GOALS OF CARE DISCUSSION  The Clinical status was relayed to family in detail-  Updated and notified of patients medical condition- Patient remains unresponsive and will not open eyes to command.   Patient with increased WOB and using accessory muscles to breathe Explained to family course of therapy and the modalities  Patient with Progressive multiorgan failure with a very high probablity of a very minimal chance of meaningful recovery despite all aggressive and optimal medical therapy.    Family understands the situation.  They have consented and agreed to DNR/DNI and would like to proceed with Comfort care measures later this afternoon  Family are satisfied with Plan of action and management. All questions answered  Additional CC time 25 mins   Oniyah Rohe Alm Cellar, M.D.  [facility]  Medical Director [facility] Medical Director [employer] Cardio-Pulmonary Department

## 2023-12-07 NOTE — Progress Notes (Addendum)
 Palliative Care Progress Note, Assessment & Plan   Patient Name: [name]       Date: 12-17-23 DOB: March 06, 1950  Age: 73 y.o. MRN#: [id number] Attending Physician: Isaiah Scrivener, MD Primary Care Physician: Antonette Angeline ORN, NP Admit Date: 11/27/2023  Subjective: Unable to assess  HPI: Per previous HPI: Concepcion Gillott is a 73 year old female with a past medical history significant for COPD, chronic hypoxic respiratory failure requiring 2 L supplemental oxygen , hypothyroidism, hypertension, and anxiety who presents to [facility] ED on 11/27/2023 for evaluation of altered mental status.  Patient is currently altered and on the ventilator and unable to contribute to history, and no family currently available, therefore history is obtained from chart.    Palliative care was consulted for assistance with goals of care conversation.  Summary of counseling/coordination of care: Extensive chart review completed prior to meeting patient including labs, vital signs, imaging, progress notes, orders, and available advanced directive documents from current and previous encounters.     Latest Ref Rng & Units 12/17/2023    4:05 AM 11/30/2023    4:04 AM 11/29/2023    4:04 AM  CBC  WBC 4.0 - 10.5 K/uL 12.1  10.8  9.3   Hemoglobin 12.0 - 15.0 g/dL 88.4  88.6  9.7   Hematocrit 36.0 - 46.0 % 37.0  36.7  32.5   Platelets 150 - 400 K/uL 285  214  156       Latest Ref Rng & Units 12-17-2023    4:05 AM 11/30/2023    4:04 AM 11/29/2023    4:04 AM  CMP  Glucose 70 - 99 mg/dL 775  739  760   BUN 8 - 23 mg/dL 46  25  33   Creatinine 0.44 - 1.00 mg/dL 8.98  9.08  9.04   Sodium 135 - 145 mmol/L 139  140  138   Potassium 3.5 - 5.1 mmol/L 4.2  3.9  3.8   Chloride 98 - 111 mmol/L 100  100  100   CO2 22 - 32 mmol/L 31  30  28     Calcium  8.9 - 10.3 mg/dL 8.7  8.4  8.0      After reviewing the patient's chart I assessed patient at bedside and spoke with family in ICU in regards to symptom management and goals of care.   Ill-appearing, elderly female lying in bed. Mechanically ventilated and sedated and pressor for support. Eyes open with fixed gaze. She does not track or acknowledge my presence. She is in no distress. No family at bedside.  Patients sons and daughter-in-law located in ICU waiting area. [name] shares that they have made decision to withdraw ventilator support later today after allowing time for family members to visit and be present for end of life.   They express that this was a shock and they are still having a hard time with the situation but also share that their mother would not want to live on machines. [name] jokes that he told his mother to get out of bed this morning so she would not be late for church but states she will be worshiping in a different way later today.  They share that they have  a large family support system who are in agreement with plan of care as well as support from their church family.   They all express appreciation for the care their mother has received during this admission.   Therapeutic silence and active listening provided for family to share their thoughts and emotions regarding current medical situation.  Emotional support provided.  Physical Exam Vitals and nursing note reviewed.  Constitutional:      General: She is not in acute distress.    Appearance: She is obese. She is ill-appearing.  HENT:     Head: Normocephalic and atraumatic.     Mouth/Throat:     Mouth: Mucous membranes are dry.  Pulmonary:     Effort: No respiratory distress.     Comments: Mechanically ventilated, sedated Skin:    General: Skin is warm and dry.   Recommendations/Plan: DNR with interventions Plan for extubation today after allowing time for family to visit Extremely poor  prognosis Anticipate hospital death          Total Time 50 minutes   Time spent includes: Detailed review of medical records (labs, imaging, vital signs), medically appropriate exam (mental status, respiratory, cardiac, skin), discussed with treatment team, counseling and educating patient, family and staff, documenting clinical information, medication management and coordination of care.     Devere Sacks, ELNITA- [facility] Palliative Medicine Team  08-Dec-2023 8:29 AM  Office [phone]  Pager [phone]

## 2023-12-07 DEATH — deceased

## 2023-12-12 ENCOUNTER — Encounter: Admitting: General Surgery

## 2023-12-12 ENCOUNTER — Ambulatory Visit: Admitting: Student in an Organized Health Care Education/Training Program

## 2023-12-26 ENCOUNTER — Other Ambulatory Visit: Payer: Self-pay | Admitting: Internal Medicine

## 2024-01-24 ENCOUNTER — Ambulatory Visit: Admitting: Neurology

## 2024-02-19 ENCOUNTER — Other Ambulatory Visit: Payer: Self-pay | Admitting: Internal Medicine

## 2024-05-08 ENCOUNTER — Encounter
